# Patient Record
Sex: Female | Born: 1940
Health system: Southern US, Community
[De-identification: ages and names within clinical notes are randomized; demographics above are authoritative.]

## PROBLEM LIST (undated history)

## (undated) DIAGNOSIS — R159 Full incontinence of feces: Secondary | ICD-10-CM

## (undated) DIAGNOSIS — I10 Essential (primary) hypertension: Secondary | ICD-10-CM

## (undated) DIAGNOSIS — K592 Neurogenic bowel, not elsewhere classified: Secondary | ICD-10-CM

## (undated) DIAGNOSIS — Z9181 History of falling: Secondary | ICD-10-CM

## (undated) DIAGNOSIS — G629 Polyneuropathy, unspecified: Secondary | ICD-10-CM

## (undated) DIAGNOSIS — M1990 Unspecified osteoarthritis, unspecified site: Secondary | ICD-10-CM

## (undated) DIAGNOSIS — M4802 Spinal stenosis, cervical region: Secondary | ICD-10-CM

## (undated) DIAGNOSIS — M810 Age-related osteoporosis without current pathological fracture: Secondary | ICD-10-CM

## (undated) DIAGNOSIS — G8220 Paraplegia, unspecified: Secondary | ICD-10-CM

## (undated) DIAGNOSIS — Z87440 Personal history of urinary (tract) infections: Secondary | ICD-10-CM

## (undated) DIAGNOSIS — S72143A Displaced intertrochanteric fracture of unspecified femur, initial encounter for closed fracture: Secondary | ICD-10-CM

## (undated) DIAGNOSIS — J449 Chronic obstructive pulmonary disease, unspecified: Secondary | ICD-10-CM

## (undated) DIAGNOSIS — G35 Multiple sclerosis: Principal | ICD-10-CM

## (undated) DIAGNOSIS — Z9350 Unspecified cystostomy status: Secondary | ICD-10-CM

## (undated) DIAGNOSIS — L89309 Pressure ulcer of unspecified buttock, unspecified stage: Secondary | ICD-10-CM

## (undated) DIAGNOSIS — E785 Hyperlipidemia, unspecified: Secondary | ICD-10-CM

## (undated) DIAGNOSIS — B351 Tinea unguium: Secondary | ICD-10-CM

## (undated) DIAGNOSIS — E119 Type 2 diabetes mellitus without complications: Secondary | ICD-10-CM

## (undated) DIAGNOSIS — L8990 Pressure ulcer of unspecified site, unspecified stage: Secondary | ICD-10-CM

## (undated) DIAGNOSIS — L89314 Pressure ulcer of right buttock, stage 4: Secondary | ICD-10-CM

## (undated) DIAGNOSIS — M109 Gout, unspecified: Secondary | ICD-10-CM

## (undated) DIAGNOSIS — E669 Obesity, unspecified: Secondary | ICD-10-CM

## (undated) DIAGNOSIS — K5900 Constipation, unspecified: Secondary | ICD-10-CM

## (undated) DIAGNOSIS — E2749 Other adrenocortical insufficiency: Secondary | ICD-10-CM

## (undated) DIAGNOSIS — L89159 Pressure ulcer of sacral region, unspecified stage: Secondary | ICD-10-CM

## (undated) DIAGNOSIS — J40 Bronchitis, not specified as acute or chronic: Secondary | ICD-10-CM

## (undated) DIAGNOSIS — F32A Depression, unspecified: Secondary | ICD-10-CM

## (undated) DIAGNOSIS — E0590 Thyrotoxicosis, unspecified without thyrotoxic crisis or storm: Secondary | ICD-10-CM

## (undated) DIAGNOSIS — K219 Gastro-esophageal reflux disease without esophagitis: Secondary | ICD-10-CM

## (undated) DIAGNOSIS — M4807 Spinal stenosis, lumbosacral region: Secondary | ICD-10-CM

## (undated) DIAGNOSIS — R609 Edema, unspecified: Secondary | ICD-10-CM

## (undated) DIAGNOSIS — E559 Vitamin D deficiency, unspecified: Secondary | ICD-10-CM

## (undated) DIAGNOSIS — L03039 Cellulitis of unspecified toe: Secondary | ICD-10-CM

## (undated) DIAGNOSIS — R269 Unspecified abnormalities of gait and mobility: Secondary | ICD-10-CM

## (undated) DIAGNOSIS — L02619 Cutaneous abscess of unspecified foot: Secondary | ICD-10-CM

## (undated) DIAGNOSIS — G709 Myoneural disorder, unspecified: Secondary | ICD-10-CM

## (undated) DIAGNOSIS — E042 Nontoxic multinodular goiter: Secondary | ICD-10-CM

## (undated) DIAGNOSIS — M4804 Spinal stenosis, thoracic region: Secondary | ICD-10-CM

## (undated) DIAGNOSIS — F329 Major depressive disorder, single episode, unspecified: Secondary | ICD-10-CM

## (undated) DIAGNOSIS — D649 Anemia, unspecified: Secondary | ICD-10-CM

## (undated) DIAGNOSIS — R4701 Aphasia: Secondary | ICD-10-CM

## (undated) DIAGNOSIS — N319 Neuromuscular dysfunction of bladder, unspecified: Secondary | ICD-10-CM

## (undated) HISTORY — DX: Unspecified abnormalities of gait and mobility: R26.9

## (undated) HISTORY — DX: Unspecified osteoarthritis, unspecified site: M19.90

## (undated) HISTORY — DX: Spinal stenosis, lumbosacral region: M48.07

## (undated) HISTORY — PX: CATARACT EXTRACTION: SUR2

## (undated) HISTORY — PX: OTHER SURGICAL HISTORY: SHX169

## (undated) HISTORY — PX: CHOLECYSTECTOMY: SHX55

## (undated) HISTORY — DX: Spinal stenosis, thoracic region: M48.04

## (undated) HISTORY — PX: BACK SURGERY: SHX140

## (undated) HISTORY — PX: ABDOMINAL HYSTERECTOMY: SHX81

## (undated) HISTORY — DX: Spinal stenosis, cervical region: M48.02

## (undated) HISTORY — DX: Obesity, unspecified: E66.9

---

## 1997-12-27 ENCOUNTER — Encounter: Admission: RE | Admit: 1997-12-27 | Discharge: 1998-03-27 | Payer: Self-pay | Admitting: *Deleted

## 1999-06-13 ENCOUNTER — Emergency Department (HOSPITAL_COMMUNITY): Admission: EM | Admit: 1999-06-13 | Discharge: 1999-06-13 | Payer: Self-pay | Admitting: *Deleted

## 1999-07-19 ENCOUNTER — Ambulatory Visit (HOSPITAL_COMMUNITY): Admission: RE | Admit: 1999-07-19 | Discharge: 1999-07-19 | Payer: Self-pay | Admitting: *Deleted

## 1999-07-24 ENCOUNTER — Encounter: Payer: Self-pay | Admitting: General Surgery

## 1999-07-25 ENCOUNTER — Observation Stay (HOSPITAL_COMMUNITY): Admission: RE | Admit: 1999-07-25 | Discharge: 1999-07-26 | Payer: Self-pay | Admitting: General Surgery

## 1999-07-25 ENCOUNTER — Encounter (INDEPENDENT_AMBULATORY_CARE_PROVIDER_SITE_OTHER): Payer: Self-pay | Admitting: Specialist

## 1999-11-28 ENCOUNTER — Emergency Department (HOSPITAL_COMMUNITY): Admission: EM | Admit: 1999-11-28 | Discharge: 1999-11-28 | Payer: Self-pay | Admitting: Emergency Medicine

## 2002-07-01 HISTORY — PX: REPLACEMENT TOTAL KNEE BILATERAL: SUR1225

## 2003-03-21 ENCOUNTER — Encounter: Payer: Self-pay | Admitting: Orthopedic Surgery

## 2003-03-23 ENCOUNTER — Inpatient Hospital Stay (HOSPITAL_COMMUNITY): Admission: RE | Admit: 2003-03-23 | Discharge: 2003-03-29 | Payer: Self-pay | Admitting: Orthopedic Surgery

## 2003-11-24 ENCOUNTER — Emergency Department (HOSPITAL_COMMUNITY): Admission: EM | Admit: 2003-11-24 | Discharge: 2003-11-25 | Payer: Self-pay | Admitting: Emergency Medicine

## 2004-10-24 ENCOUNTER — Inpatient Hospital Stay (HOSPITAL_COMMUNITY): Admission: RE | Admit: 2004-10-24 | Discharge: 2004-10-27 | Payer: Self-pay | Admitting: Orthopedic Surgery

## 2005-10-14 ENCOUNTER — Encounter: Admission: RE | Admit: 2005-10-14 | Discharge: 2005-10-14 | Payer: Self-pay | Admitting: Orthopedic Surgery

## 2005-10-19 ENCOUNTER — Encounter: Admission: RE | Admit: 2005-10-19 | Discharge: 2005-10-19 | Payer: Self-pay | Admitting: Orthopedic Surgery

## 2005-11-28 ENCOUNTER — Inpatient Hospital Stay (HOSPITAL_COMMUNITY): Admission: RE | Admit: 2005-11-28 | Discharge: 2005-12-05 | Payer: Self-pay | Admitting: Neurosurgery

## 2005-11-28 ENCOUNTER — Ambulatory Visit: Payer: Self-pay | Admitting: *Deleted

## 2005-12-04 ENCOUNTER — Ambulatory Visit: Payer: Self-pay | Admitting: Physical Medicine & Rehabilitation

## 2005-12-05 ENCOUNTER — Inpatient Hospital Stay (HOSPITAL_COMMUNITY)
Admission: RE | Admit: 2005-12-05 | Discharge: 2005-12-18 | Payer: Self-pay | Admitting: Physical Medicine & Rehabilitation

## 2005-12-30 ENCOUNTER — Ambulatory Visit (HOSPITAL_COMMUNITY): Admission: RE | Admit: 2005-12-30 | Discharge: 2005-12-30 | Payer: Self-pay | Admitting: Neurology

## 2006-01-08 ENCOUNTER — Encounter
Admission: RE | Admit: 2006-01-08 | Discharge: 2006-04-08 | Payer: Self-pay | Admitting: Physical Medicine & Rehabilitation

## 2006-01-08 ENCOUNTER — Ambulatory Visit: Payer: Self-pay | Admitting: Physical Medicine & Rehabilitation

## 2006-03-06 ENCOUNTER — Ambulatory Visit: Payer: Self-pay | Admitting: Physical Medicine & Rehabilitation

## 2006-03-17 ENCOUNTER — Encounter
Admission: RE | Admit: 2006-03-17 | Discharge: 2006-05-26 | Payer: Self-pay | Admitting: Physical Medicine & Rehabilitation

## 2008-09-14 ENCOUNTER — Encounter: Admission: RE | Admit: 2008-09-14 | Discharge: 2008-09-14 | Payer: Self-pay | Admitting: Internal Medicine

## 2010-04-12 ENCOUNTER — Encounter
Admission: RE | Admit: 2010-04-12 | Discharge: 2010-06-20 | Payer: Self-pay | Source: Home / Self Care | Attending: Neurology | Admitting: Neurology

## 2010-07-22 ENCOUNTER — Encounter: Payer: Self-pay | Admitting: Neurology

## 2010-11-16 NOTE — Discharge Summary (Signed)
NAME:  Laura Roberts, Laura Roberts                        ACCOUNT NO.:  [id number]   MEDICAL RECORD NO.:  [id number]                   PATIENT TYPE:  INP   LOCATION:  5005                                 FACILITY:  [facility]   PHYSICIAN:  Elana Alm. Thurston Hole, M.D.              DATE OF BIRTH:  [birth year]   DATE OF ADMISSION:  03/23/2003  DATE OF DISCHARGE:  03/29/2003                                 DISCHARGE SUMMARY   ADMISSION DIAGNOSES:  1. End-stage degenerative joint disease, right knee.  2. Non-insulin-dependent diabetes.  3. Coronary artery disease.  4. Hypertension.   DISCHARGE DIAGNOSES:  1. End-stage degenerative joint disease, right knee.  2. Non-insulin-dependent diabetes.  3. Coronary artery disease.  4. Hypertension.   HISTORY OF PRESENT ILLNESS:  The patient is a 69 year old black female with  a long-standing history of bilateral knee DJD, right more painful than left.  She had tried anti-inflammatories, cortisone and Supartz, all without  success.  She understands the risks, benefits and possible complications of  a right total knee replacement for bone-on-bone osteoarthritis and is  without question.   PROCEDURES IN HOUSE:  On March 23, 2003, the patient underwent a right  total knee replacement by Dr. Elana Alm. Wainer.  She tolerated the procedure  well.   HOSPITAL COURSE:  Postop day 1, she was doing well.  Her pain was under  control with a PCA.  She was in her CPM, T-max of 102.4, T now was 99.9.  Surgical wound was well-approximated.  She was placed on Dilaudid for pain,  as she had a codeine allergy.  Blood cultures x2 were ordered for a  temperature of 101 or greater.  Her PCA was discontinued and her drain was  discontinued.  Postop day 2, her hemoglobin was 6.4; she was transfused two  units of packed red blood cells; T-max of 102.5; her blood sugar was 164.  Postop day 3, T-max of 101.6, no shortness of breath or chest pain.  She is  on PCA morphine, as the  Dilaudid was not controlling her pain.  Her  hemoglobin was 10.2, status post packed red blood cells yesterday; it was  8.6 this morning; two more units of packed red blood cells were transfused.  Internal Medicine hospitalist was consulted due to persistent fever on  postop day 3.  She is on Cipro for a UA, but still continues to be febrile;  Cipro was discontinue and she was placed on Zosyn.  Continue with physical  therapy.  She tolerated the blood transfusion well.  Postop day 5, T-max of  100.6, pain was under control with oral (p.o.) morphine, fever was improving  on Zosyn, hemoglobin was 9.3, potassium was 3.1, her white blood cell count  was resolving; she is on day 3 of Zosyn.  Blood cultures and urine cultures  were negative.  Postop day 6, patient is significantly improved.  Temperature is 99.5.  Knee is swollen but no drainage.  She was discharged  to home in stable condition, weightbearing as tolerated with her knee  immobilizer.   CURRENT MEDICATIONS:  1. Coumadin 5 mg one p.o. daily.  2. MS Contin 15 mg three tablets twice a day.  3. MSIR 15 mg one tablet q.6 h. p.r.n. pain.  4. Clindamycin 300 mg one tablet four times a day.  5. Diovan 160 mg one p.o. daily.  6. Metformin 500 mg twice a day, one in the morning and one in the evening.  7. Nu-Iron 150 mg twice a day.  8. Senior multivitamin one tablet once a day.   ACTIVITY:  She is weightbearing as tolerated.   DIET:  On a regular diabetic diet.   DISPOSITION:  Discharged to home in stable condition.      Kirstin Shepperson, P.A.                  Robert A. Thurston Hole, M.D.    KS/MEDQ  D:  04/13/2003  T:  04/13/2003  Job:  [id number]

## 2010-11-16 NOTE — Discharge Summary (Signed)
NAME:  Laura Roberts, Laura Roberts NO.:  [id number]   MEDICAL RECORD NO.:  [id number]          PATIENT TYPE:  INP   LOCATION:  5014                         FACILITY:  [facility]   PHYSICIAN:  Elana Alm. Thurston Hole, M.D. DATE OF BIRTH:  Jul 14, 1940   DATE OF ADMISSION:  10/24/2004  DATE OF DISCHARGE:  10/27/2004                                 DISCHARGE SUMMARY   ADMITTING DIAGNOSES:  1.  End-stage degenerative joint disease left knee.  2.  Hypertension.  3.  Diabetes.   DISCHARGE DIAGNOSES:  1.  End-stage degenerative joint disease left knee status post total knee.  2.  Hypertension.  3.  Diabetes.   HISTORY OF PRESENT ILLNESS:  Patient is a 70 year old black female with a  history of bilateral end-stage DJD.  She had a right total knee replacement  in September of 2004 and has done very well with this.  Now she is ready to  have a left total knee replacement as she has failed conservative care  including anti-inflammatories and physical therapy.  She understands risks,  benefits, and possible complications of a left total knee replacement and is  without question.   PROCEDURES:  On October 24, 2004 patient underwent a left total knee  replacement by Dr. Thurston Hole as well as a left knee femoral nerve block by  anesthesia.  She tolerated both procedures well.  She was admitted  postoperatively for DVT prophylaxis, pain control, and physical therapy.  Postoperative day one she had difficulty with pain control, Tmax of 101.1.  Temperature on examination was 100.7.  Hemoglobin was 9.4.  Her UA was  negative.  Her surgical wound was well approximated.  Patient has multiple  history of fever of unknown origin and increased blood sugar.  She had it  completely worked up when her left knee was done and no reason was found for  these fevers.  Dressing change was ordered.  Her PCA was discontinued and  she was started on Dilaudid for pain.  Postoperative day two patient was  doing well except  for pain control.  Her hemoglobin was 8.1.  INR was 1.5.  She was metabolically stable.  She received 2 units of packed red blood  cells on postoperative day two.  Postoperative day three patient was doing  better on p.o. Dilaudid.  Hemoglobin was 9.4 post transfusion.  Tmax was  100.1.  She was discharged to home in stable condition.  Weightbearing as  tolerated on the regular diabetic diet.   DISCHARGE MEDICATIONS:  1.  Dilaudid 2 mg one to three tablets every four hours as needed for pain.  2.  Robaxin 500 mg one to two q.4-6h. p.r.n. muscle spasm.  3.  Coumadin 5 mg one tablet daily.  4.  Diovan 160 mg one tablet twice a day.  5.  Atenolol 50 mg one tablet a day.  6.  Metformin 500 mg two tablets in the morning, one tablet in the evening.  7.  Calcium plus D 500 mg one tablet a day.   She has been instructed to use the CPM eight hours a day  0-90 degrees and  elevate her left heel on a folded pillow every morning for 30 minutes.  She  is to call with a temperature greater than 101, increased pain, increased  redness, increased swelling, or increased drainage.  She will follow up with  Dr. Thurston Hole on May 9.       KS/MEDQ  D:  01/21/2005  T:  01/21/2005  Job:  [id number]

## 2010-11-16 NOTE — Discharge Summary (Signed)
NAME:  NIANG, MITCHELTREE NO.:  [id number]   MEDICAL RECORD NO.:  [id number]          PATIENT TYPE:  INP   LOCATION:  3105                         FACILITY:  [facility]   PHYSICIAN:  Hewitt Shorts, M.D.DATE OF BIRTH:  09-10-1940   DATE OF ADMISSION:  11/28/2005  DATE OF DISCHARGE:  12/05/2005                                 DISCHARGE SUMMARY   ADMISSION HISTORY AND PHYSICAL EXAMINATION:  The patient is a 70 year old  woman who presented with thoracic stenosis at T11-12 level.  MRI revealed  alteration of signal from within the spinal cord.  She had undergone a right  total knee replacement in September of 2005, a left total knee replacement  in April of 2006.  She had chronic aching and weakness in her thighs and  legs since those surgeries.  She underwent physical therapy and her  orthopedist obtained an MRI scan and found the thoracic stenosis.  Initially, examination showed mild weakness of the iliopsoas bilaterally.  Reexamination showed progression of her weakness with increased weakness in  the lower extremities and therefore the patient was admitted for a  decompressive thoracic laminectomy.  Her lumbar studies did show lumbar  stenosis and she may well require lumbar laminectomy for decompression.  It  was felt best to proceed slowly with thoracic decompression.  Further  details of her history are included in admission note.  General examination  was unremarkable.  Neurologic examination showed paraparesis of the  iliopsoas 4/5 with quadriceps 5/5 bilaterally.  Dorsiflexion was 4+ to 5  bilaterally and the extensor hallicis longus was 4 to 4+ bilaterally.  Plantar flexion was 5 bilaterally.  Sensation was intact to pinprick.  Reflexes were intact.   HOSPITAL COURSE:  The patient was admitted, underwent a thoracic laminectomy  for decompression.  She did well through the surgery.  However, on the first  postoperative evening, she developed dull anterior chest  discomfort when she  went up to the bathroom the third time.  Vital signs showed a pulse of 80,  blood pressure 149/88 and O2 saturation of 95.  The patient was given  nitroglycerin.  She denied shortness of breath but had mild dyspnea and also  hiccups.  On exam she had no diaphoresis, no increased work of breathing, no  acute distress.  Her wound and dressing appeared clean and dry.  The  official interpretation of her EKG was that it was normal.  The patient  reported 75% improvement of the chest discomfort with nitroglycerin and it  was repeated.  She was also given antacid and we consulted [facility] who saw the patient.  CT scan of the chest was obtained  and was negative for pulmonary embolism.  The patient was changed over to  telemetry.  This patient was able to make stable progress, increasing  ambulation and activity.  The patient then 2 days after surgery developed  weakness in the lower extremities.  She was assessed by Dr. Channing Mutters who obtained  an MRI scan and found no evidence of hematoma or acute compression.  The  patient was started on  steroids and subsequent evaluation showed improvement  with Decadron.  The patient was seen in neurology consultation by Dr. Sandria Manly.  MRI was reviewed and question of whether the patient in fact had underlying  multiple sclerosis and may have an acute exacerbation.  My review of the MRI  scan of the thoracic spine done postoperatively showed good decompression of  the thoracic stenosis with typical post surgical changes. We began to taper  the patient's Decadron.  Physical therapy and occupational therapy were  consulted and physical medicine Rehabilitation were consulted regarding  comprehensive inpatient rehabilitation.  Dr. Sandria Manly ordered a number of tests  including visual responses, auditory responses, serum ANA and plan to  proceed with lumbar puncture when she was stable.  Cardiology felt that the  patient did not suffer any  acute cardiac event and felt that she had  recovered well from a cardiac perspective without any significant cardiac  problems.  The patient was accepted to rehabilitation and was transferred to  rehabilitation by Dr. Gwenlyn Saran.  The patient is planned to be continued to  be followed by Dr. Sandria Manly who planned a lumbar puncture the following week and  her neurology workup is going to continue, rehabilitation patient as well as  an outpatient with Dr. Sandria Manly.   DISCHARGE DIAGNOSES:  1.  Thoracic stenosis with spinal cord compression status post thoracic      laminectomy during this hospitalization.  2.  Chest pain postoperatively, rule out for myocardial infarction, rule out      for pulmonary embolism, etiology      uncertain.  3.  Question of multiple sclerosis undergoing evaluation by Dr. Avie Echevaria.  4.  Paraparesis related to her thoracic stenosis and possibly related to      multiple sclerosis if present.      Hewitt Shorts, M.D.  Electronically Signed     RWN/MEDQ  D:  01/08/2006  T:  01/08/2006  Job:  [id number]

## 2010-11-16 NOTE — Consult Note (Signed)
NAME:  Laura Roberts, Laura Roberts NO.:  [id number]   MEDICAL RECORD NO.:  [id number]          PATIENT TYPE:  INP   LOCATION:  3106                         FACILITY:  [facility]   PHYSICIAN:  Vida Roller, M.D.   DATE OF BIRTH:  09/19/1940   DATE OF CONSULTATION:  11/28/2005  DATE OF DISCHARGE:                                   CONSULTATION   PRIMARY CARE PHYSICIAN:  Dr. Nicholos Johns.   SUMMARY OF HISTORY:  Laura Roberts is a 70 year old African-American female who  was admitted postoperatively after her thoracic laminectomy.  We were asked  to consult secondary to postoperative chest discomfort.   Her surgery today was unremarkable.  Patient states that she underwent the  surgery because after both of her knees were repaired, as she started  physical rehab, she began having back discomfort associated with bilateral  leg weakness and numbness, which led to her evaluation of her back, and  today surgery.  Postoperatively, she states that she has felt well, except  for some back soreness, and she developed hiccups immediately after surgery;  this had been intermittent throughout the day.  On her third trip to the  bathroom, she also developed some anterior chest indigestion type feelings.  She felt that this became worse when she tried to burp.  She denied any  radiation of the discomfort, nausea, vomiting, shortness of breath or  diaphoresis.  It was not particularly worse with her hiccups nor was it  pleuritic.  She denies prior occurrences.  She thinks that the discomfort  actually started around 1700 hours.  She initially gave it a 9 on the scale  of 0-10.  Dr. Newell Coral had given her 2 sublingual nitroglycerin, which she  states reduced the discomfort to a 2 on a scale of 0-10.  She is also  complaining of side pains of her abdomen.   PAST MEDICAL HISTORY:   ALLERGIES:  CODEINE.   MEDICATIONS PRIOR TO ADMISSION:  Include:  1.  Diovan 160 b.i.d.  2.  Metformin 1000 mg q.a.m.  500 mg q.p.m.  3.  Atenolol 50 mg q.d.  4.  Aspirin 81 mg q.d.  5.  Goody powder p.r.n.  6.  Lidocaine patch, unknown dosage, 12 hours on, 12 hours off.   PAST MEDICAL HISTORY:  Notable for:  1.  Type 2 diabetes.  2.  Obesity.  3.  Hypertension.  4.  DJD with right and left knee replacements in October 2004 and April      2004, respectively.  According to the patient, prior to her knee surgery      in 2004, she did have an adenosine Myoview at [facility] and      [facility], and she states that this was okay.  5.  Hysterectomy in the 1970s.  6.  Cholecystectomy in 2006.  7.  Left carpal tunnel repair, unknown date.   SOCIAL HISTORY:  She resides in [place] alone.  She has 2 sons, 2  grandchildren, no great-grandchildren.  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  She  denies any  alcohol, tobacco, drugs, herbal medications.  She does try to maintain low-  salt ADA diet.  She does not exercise, secondary to her arthritis.  She has  been undergoing rehab given her recent knee surgeries.   FAMILY HISTORY:  Mother is deceased in her 45s with some type of cancer,  history of diabetes.  Her father died at an unknown age of cirrhosis.  She  has 2 sisters alive and well.  One brother with history of ETOH.   REVIEW OF SYSTEMS:  In addition to above, notable for reading glasses,  partial dentures, postmenopausal, some arthralgias in her back, legs and  knees, and a known cyst in her chest wall.   CURRENT MEDICATIONS:  Included:  1.  Atenolol 50 mg q.d.  2.  Toradol p.r.n.  3.  Metformin 1000 mg q.a.m., 500 mg q.p.m.  4.  Diovan 150 q.d.   PHYSICAL EXAMINATION:  GENERAL: Well-nourished, well-developed pleasant  obese African-American female in no apparent distress.  Temperature 97.6.  Blood pressure 149/88.  Pulse 80.  Respirations 20.  Admission weight was  183.7.  Sat 95% on room air.  NECK: Supple without  thyromegaly, adenopathy, JVD or carotid bruits.  CHEST: Symmetrical excursion.  Lungs were clear to auscultation without  rales, rhonchi, or wheezing.  HEART: PMI is not displaced.  Regular rate and rhythm.  Do not appreciate  any murmurs, rubs, clicks or gallops.  EXTREMITIES: Negative cyanosis, clubbing or edema.  She does have lower  extremity pneumatic compression devices on.  Distal pulses were symmetrical  and intact.  SKIN: Appeared to be intact.  ABDOMEN: Obese.  Bowel sounds present, but were slightly diminished without  organomegaly, masses or tenderness.  MUSCULOSKELETAL: Essentially unremarkable.  NEURO: Grossly unremarkable.   Chest x-ray preoperatively showed no active disease.  Chest x-ray performed  today showed mild congestion and left lower lobe atelectasis.  Preoperative  EKG shows normal sinus rhythm, left axis deviation, normal interval, delayed  R and T wave inversions in leads III and aVF.  No old EKGs are available for  comparison.  Subsequent EKG today during chest discomfort did not show any  acute changes.   H&H is 12.9 and 39.2.  Normal indices, platelets 388,000, WBC is 10.9.  Sodium 139, potassium 4.6, BUN 12, creatinine 1.1, glucose 103.   IMPRESSION:  1.  Status post inferior T10-11-12 laminectomy on 11/28/2005.  2.  Postoperative hiccups.  3.  Postoperative atypical prolonged chest discomfort of uncertain etiology.      However, we need to rule out pulmonary embolism, an acute coronary      syndrome.  4.  Hypertension.  5.  History as noted for past medical history.   DISPOSITION:  Dr. Dorethea Clan reviewed patient's history, spoke and examined the  patient and agrees with the above.  We will cycle enzymes to rule out  ACS/myocardial infarction.  Given her high risk for pulmonary embolism, we  are also ordering a spiral CT and lower extremity Dopplers.  We will continue to treat her discomfort with pain medications since anticoagulation  with heparin,  Integrilin or aspirin is contraindicated given her surgery  today.  If she rules out for myocardial infarction, we will consider an  echocardiogram and an adenosine Myoview, which could be done as an  outpatient.  If her CT scan is positive, she will need an inferior vena cava  filter, as we are not able to anticoagulate her.      Joellyn Rued,  P.A. [facility]      Vida Roller, M.D.  Electronically Signed    EW/MEDQ  D:  11/28/2005  T:  11/29/2005  Job:  [id number]   cc:   Hewitt Shorts, M.D.  Fax: [phone]   Georgianne Fick, M.D.  Fax: [phone]

## 2010-11-16 NOTE — Op Note (Signed)
NAME:  Laura Roberts, Laura Roberts NO.:  [id number]   MEDICAL RECORD NO.:  [id number]          PATIENT TYPE:  INP   LOCATION:  2899                         FACILITY:  [facility]   PHYSICIAN:  Hewitt Shorts, M.D.DATE OF BIRTH:  1940-09-06   DATE OF PROCEDURE:  11/28/2005  DATE OF DISCHARGE:                                 OPERATIVE REPORT   PREOPERATIVE DIAGNOSIS:  Thoracic stenosis with myelopathy.  Thoracic  spondylosis with myelopathy.  Thoracic degenerative disk disease with  myelopathy and paraparesis.   POSTOPERATIVE DIAGNOSIS:  Thoracic stenosis with myelopathy.  Thoracic  spondylosis with myelopathy.  Thoracic degenerative disk disease with  myelopathy and paraparesis.   OPERATION PERFORMED:  Inferior T10, T11 and T12 laminectomy with  decompression to T10-11 level, T11-12 level and T12-L1 level with  microdissection.   SURGEON:  Hewitt Shorts, M.D.   ASSISTANT:  Hilda Lias, M.D. and Nelia Shi. Webb Silversmith, RN   ANESTHESIA:  General endotracheal.   INDICATIONS FOR PROCEDURE:  The patient is a 70 year old woman who presented  with paraparesis and was found to have thoracic stenosis secondary to  spondylosis and degenerative disk disease at the T11-12 level due to disk  bulging and facet and ligament hypertrophy.  The cords showed evidence of  increased signal with enhancement and decision was made to proceed with  decompression.   DESCRIPTION OF PROCEDURE:  The patient was brought to the operating room and  placed under general endotracheal anesthesia.  The patient was turned to a  prone position and the thoracic and lumbar regions were prepped with  Betadine soap and solution and draped in sterile fashion.  The midline was  infiltrated with local anesthetic with epinephrine.  An x-ray was taken to  localize the T11-12 level.  Then a midline made and carried down to through  the subcutaneous tissue.  Bipolar cautery and electrocautery were used to  maintain  hemostasis.  Dissection was carried down to the thoracolumbar  fascia which was incised bilaterally and the paraspinal muscles were  dissected from the spinous process and lamina in a subperiosteal fashion.  Bipolar cautery and electrocautery  were used to maintain hemostasis.  Self-  retaining retractors were placed.  Another x-ray was taken and the T10, T11  and T12 spinous processes and lamina were identified.  Laminectomy was  performed using the double action rongeur, X-max drill and Kerrison punches.  The microscope was draped and brought into the field to provide additional  magnification, illumination and visualization and the decompression was  performed using microdissection and microsurgical technique.  Edges of the  bone were waxed as needed with bone wax to maintain hemostasis.  Complete  T12 and T11 laminectomies were performed as well as an inferior T10  laminectomy and we were able to decompress T10-11, T11-12 and T12-L1 levels.  The ligamentum flavum was removed and good decompression of the thecal sac  was achieved from the midportion of T10 to the superior aspect of L1 (which  was undercut as well).  Once the decompression was completed, the bone was  again waxed.  Gelfoam was placed in  the laminectomy defect.  Good hemostasis  was confirmed and then we proceeded with closure.  The paraspinal muscles  were approximated with interrupted undyed 1 Vicryl sutures.  The deep fascia  closed with interrupted, undyed 1 Vicryl sutures, Scarpa's fascia closed  with interrupted undyed 1 Vicryl sutures and the subcutaneous, subcuticular  layer were closed with interrupted inverted 2-0 and 3-0 undyed Vicryl  sutures.  Skin edges approximated with Dermabond.  Wound was dressed with  Adaptic and sterile gauze and Hypafix.  The procedure was tolerated well.  The estimated blood loss was 75 mL.  Sponge, needle and instrument counts  were correct.  Following surgery, the patient was turned  back to supine  position to be reversed from anesthetic, extubated and transferred to  recovery room for further care.      Hewitt Shorts, M.D.  Electronically Signed     RWN/MEDQ  D:  11/28/2005  T:  11/28/2005  Job:  [id number]

## 2010-11-16 NOTE — Assessment & Plan Note (Signed)
REASON FOR VISIT:  Laura Roberts returns to the clinic today for follow up  evaluation.  The patient underwent a T12 laminectomy and decompression along  with microdiskectomy Nov 28, 2005.  She was also found to have a new  diagnosis of multiple sclerosis.   Since the last clinic visit the patient has finished all home health  therapy.  They have instructed her in a home exercise program, which she has  been requested to do four times per day along with walking on a regular  basis using her walker.  There is also a suggestion about outpatient aquatic  therapy.  The patient is not sure about aquatic therapy as she needs to have  a pool if it does not exacerbate her multiple sclerosis.   In terms of her multiple sclerosis she is followed up by Dr. Sandria Manly, her  neurologist.  Dr. Sandria Manly presently has her on Rebif three times per week.  The  patient is due to follow up with Dr. Newell Coral, her neurosurgeon, the end of  the month.  She has already seen her primary care physician, Dr.  Nicholos Johns.   The patient reports that she can walk on her feet with her rolling walker.  She reports that she uses her Lidoderm patches on an as needed basis also  with the Darvocet and Robaxin; and, gets good relief overall.  She reports  that her blood sugars has been in the 80-120 range.   MEDICATIONS:  1. Diovan 160 mg twice a day  2. Glucophage 500 mg in the morning and 1,000 mg in the evening.  3. Lidoderm patch on 12 hours and off 12 hours.  4. Atenolol 100 mg daily.  5. Tylenol p.r.n.  6. Robaxin 500 mg q.6 hours p.r.n. (approximately three per day).  7. Darvocet N 100 q.6 hours (approximately three per day).   REVIEW OF SYSTEMS:  The review of systems is positive for fever and chills,  night sweats, high and low blood sugars, and poor appetite.   PHYSICAL EXAMINATION:  GENERAL APPEARANCE:  This is a well-appearing thin  adult female in mild-to-no acute discomfort.  VITAL SIGNS:  Blood pressure is  152/84 with a pulse of 81, respiratory rate  18 and O2 saturation 98% on room air.  NEUROLOGIC EXAMINATION:  The patient has at least 4+ to 5-/5 strength  throughout the bilateral upper and lower extremities.  Bulk and tone are  normal.  Reflexes are 2+ and symmetrical.  She has slightly decreased  sensation in the left leg compared to the right.   IMPRESSION:  1. Status post thoracic 10, thoracic 12 laminectomy and decompression with      microdiskectomy, Nov 28 2005, for thoracic stenosis with myelopathy.  2. Recent diagnosis of multiple sclerosis.  3. Noninsulin-dependent diabetes mellitus.  4. Hypertension.   In the office today no refills were necessary on her medications.  We have  set her up for outpatient therapy at the [facility] address.  She is  interested in possibly doing aquatic therapy, but needs to check with some  of the local pools and some of the people in the multiple sclerosis support  group to see which pool may be most appropriate for her.  We will not set up  any aquatic therapy at this time.   We will plan to see the patient follow up in approximately three months  time.           ______________________________  Ellwood Dense, M.D.  DC/MedQ  D:  03/07/2006 13:50:33  T:  03/07/2006 22:44:02  Job #:  [id number]

## 2010-11-16 NOTE — Discharge Summary (Signed)
NAME:  Laura Roberts, Laura Roberts NO.:  [id number]   MEDICAL RECORD NO.:  [id number]          PATIENT TYPE:  IPS   LOCATION:  4001                         FACILITY:  [facility]   PHYSICIAN:  Ellwood Dense, M.D.   DATE OF BIRTH:  January 15, 1941   DATE OF ADMISSION:  12/05/2005  DATE OF DISCHARGE:                                 DISCHARGE SUMMARY   DICTATED BY:  Mariam Dollar, P.A.   DISCHARGE DIAGNOSES:  1.  Thoracic myelopathy with stenosis status post inferior thoracic T10,      T11, T12 laminectomy with decompression; thoracic T11 and T11,T12 with      T12-L1 microdissection on Nov 28, 2005.  2.  Pain management.  3.  Workup for multiple sclerosis.  4.  Non-insulin dependent diabetes mellitus.  5.  Hypertension.   HISTORY OF PRESENT ILLNESS:  A 70 year old right handed black female with  history of total knee replacements in 2006, who is admitted on May 31 with  progressive lower extremity tightness and weakness since second knee  replacement of April 2006.  Noted MRI of the brain on April 21 with white  matter lesions consistent with multiple sclerosis.  Thoracic spine films on  May 31 with thoracic stenosis T11-T12 level.  Underwent inferior T10, T11,  and T12 laminectomy decompression, microdissection on May 31 per Dr.  Newell Coral.  Decadron taper initiated.  Postoperative chest discomfort, CT of  the chest negative.  Troponin negative.  Cardiology Dr. Dorethea Clan planned  outpatient stress test.  Followup neurology service, Dr. Sandria Manly with workup  for multiple sclerosis.  Serum ANA and EEG were pending.  Plan lumbar  puncture.  She was admitted for a comprehensive rehab program.   PAST MEDICAL HISTORY:  See discharge diagnoses.  No alcohol or tobacco.   ALLERGIES:  CODEINE.   SOCIAL HISTORY:  She lives alone.  Works with [employer].  She is widowed.  Family Can Assist is needed.  She lives on a 1 level home  with 1 step entry.   MEDICATIONS:  1.  Prior to  admission were Diovan 160 mg daily.  2.  Glucophage 500 mg 1 in the a.m., 2 in the p.m.  3.  Atenolol 50 mg daily.  4.  Aspirin 81 mg daily.  5.  Lidoderm patch.  6.  Goody powders as needed.   REHABILITATION AND HOSPITAL COURSE:  Patient was admitted to inpatient rehab  services with therapies initiated on a b.i.d. basis consisting of physical  therapy, occupational therapy and rehabilitation nursing.  The following  issues were addressed during patient's rehabilitation state.  Pertaining to  Mrs. Laskowski's thoracic myelopathy, multilevel thoracic T10, T11, T12  laminectomy with decompression and microdissection on Nov 28, 2005 per Dr.  Newell Coral surgical site healing nicely.  No signs of infection.  She would  follow up with Dr. Newell Coral.  She completed a Decadron taper.  She continued  on her Lidoderm patch as well as Duragesic for pain control with good  results.  Workup for multiple sclerosis per neurology services, Dr. Sandria Manly,  plan lumbar puncture prior to discharge to  establish ongoing suspect  multiple sclerosis identified per MRI of the brain on October 19, 2005 that  showed white matter lesions consistent with multiple sclerosis.  She  remained on subcutaneous Lovenox for deep vein thrombosis prophylaxis  throughout her rehab course.  Blood sugars is 92, 135 and 133.  She  continued on her Glucophage as well as diabetic diet.  Blood pressures  controlled with Diovan as well as Tenormin with diastolic pressure 63-70.  Overall, for her function status, she was minimal assist for function  mobility, supervision to minimal assist for activities of daily living.  She  was discharged to home with ongoing therapies as recommended per rehab  services.  Her strength and endurance had greatly improved.   Latest labs shows sodium 137, potassium 4.0, BUN 16, creatinine 0.8,  hemoglobin 12.2, hematocrit 36.5, platelet 467,00.   DISCHARGE MEDICATIONS:  1.  Diovan 160 mg twice daily.  2.   Glucophage 500 mg in the a.m., 1000 mg in the p.m.  3.  Duragesic patch 25 mcg, change every 72 hours.  4.  Lidoderm patch remove every 12 hours.  5.  Atenolol 100 mg daily.  6.  Tylenol as needed.  7.  Robaxin 500 mg every 6 hours as needed, spasms.  8.  Darvocet-N 100 1 tab every 6 hours as needed.   DIET:  Diabetic diet.   ACTIVITY:  As tolerated.   SPECIAL INSTRUCTIONS:  1.  Follow up with Dr. Sandria Manly, neurology services, [phone], concerning workup      for multiple sclerosis and results of most recent lumbar puncture.  2.  Follow up with Dr. Newell Coral, neurosurgery, [phone].  3.  Call for appointment, Dr. Ellwood Dense, rehab services as advised.  4.  Talk to Dr. Nicholos Johns, medical management.           ______________________________  Ellwood Dense, M.D.     DC/MEDQ  D:  12/17/2005  T:  12/17/2005  Job:  [id number]   cc:   Genene Churn. Love, M.D.  Fax: [phone]   Hewitt Shorts, M.D.  Fax: [phone]   Georgianne Fick, M.D.  Fax: [phone]   Vida Roller, M.D.  Fax: [phone]

## 2010-11-16 NOTE — H&P (Signed)
NAME:  Laura Roberts, Laura Roberts NO.:  [id number]   MEDICAL RECORD NO.:  [id number]          PATIENT TYPE:  IPS   LOCATION:  4001                         FACILITY:  [facility]   PHYSICIAN:  Ellwood Dense, M.D.   DATE OF BIRTH:  October 09, 1940   DATE OF ADMISSION:  12/05/2005  DATE OF DISCHARGE:                                HISTORY & PHYSICAL   NEUROLOGIST:  Dr. Sandria Manly   NEUROSURGEON:  Dr. Newell Coral   PRIMARY CARE PHYSICIAN:  Dr. Nicholos Johns   CARDIOLOGIST:  Dr. Dorethea Clan   HISTORY OF PRESENT ILLNESS:  Laura Roberts is a 70 year old right-handed African-  American female with a history of bilateral total knee replacements in 2006.   The patient was admitted Nov 28, 2005, with progressive low extremity  tightness and weakness since her second total knee replacement in April  2006.  She was noted to have an MRI scan of her brain October 19, 2005, which  showed white matter lesions consistent with multiple sclerosis.  Her  remaining past medical history is significant for diabetes mellitus x10  years along with hypertension.   A thoracic spine film Nov 28, 2005, had shown thoracic stenosis at T11-12  level.  The patient was evaluated by Dr. Newell Coral and underwent an inferior  T10-T12 laminectomy and decompression with mass dissection T11-12, T12-L1 on  Nov 28, 2005, by Dr. Newell Coral.  Her Decadron taper was advised down to 4 mg  q.12h. as of December 06, 2005, and then decreased to 2 mg q.12h. as of December 07, 2005 with questionable discontinuation.  She was noted to have chest  discomfort and a chest CT was negative for pulmonary embolism.  Troponin  levels were negative.  Dr. Dorethea Clan from cardiology planned an outpatient  stress test.  She has had followup with Dr. Sandria Manly from neurology and workup  for MS showed a serum ANA and EEG pending.  Brain stem evoked potentials  were normal.  There is a plan for a lumbar puncture next week.  Pain control  was initially with PCA pump and that has been  discontinued and she is  presently on a Lidoderm patch with p.r.n. Darvocet.  She reports that she is  not getting adequate relief but also reports that she cannot take any  codeine products.   The patient was evaluated by the rehabilitation physicians and felt to be an  appropriate candidate for inpatient rehabilitation.   REVIEW OF SYSTEMS:  Positive for reflux, lumbago, weakness, and numbness.   PAST MEDICAL HISTORY:  1.  Hypertension.  2.  Non-insulin-dependent diabetes mellitus.  3.  Prior cholecystectomy.  4.  Left carpal tunnel release.  5.  Bilateral total knee replacements in 2006.   FAMILY HISTORY:  Positive for coronary artery disease.   SOCIAL HISTORY:  The patient is widowed and lives alone.  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  Most of the children are in protective environment  until their other family members are cleared from drugs.  The family of Laura Roberts  can assist as needed when she leaves.  She lives in a one-level home  with one step to enter.  She does not use alcohol or tobacco.   FUNCTIONAL HISTORY PRIOR TO ADMISSION:  Independent and working.   ALLERGIES:  CODEINE intolerance.   MEDICATIONS PRIOR TO ADMISSION:  1.  Diovan 160 mg daily.  2.  Glucophage 500 mg one tablet q.a.m. and two tablets q.p.m.  3.  Atenolol 50 mg daily.  4.  Aspirin 81 mg daily.  5.  Goody Powder p.r.n.  6.  Lidocaine patch daily p.r.n.   Recent labs showed hemoglobin 12.9; hematocrit of 39.2; platelet count of  380,000; and white count of 10.9.  Recent sodium was 139, potassium 4.6,  chloride 103, CO2 27, BUN 12, and creatinine 1.1.   PHYSICAL EXAMINATION:  VITAL SIGNS:  Blood pressure 135/83 with a pulse 75,  respiratory rate 18, and temperature 98.5.  HEENT:  Normocephalic, nontraumatic.  CARDIOVASCULAR:  Regular rate and rhythm, S1, S2, without murmurs.  ABDOMEN:  Soft, nontender, with positive  bowel sounds.  BACK:  Shows well-healing wound with dry dressing.  NEUROLOGIC:  Alert and oriented x3.  Cranial nerves II-XII are intact.  Bilateral upper extremity exam showed 5-/5 strength throughout.  Bulk and  tone were normal and reflexes were 2+ and symmetrical.  Sensation was intact  to light touch throughout.  Lower extremity exam showed 4/5 to 4+/5 strength  throughout with some complaints of pain with any range of motion or motion.  Sensation was slightly decreased in the bilateral lower extremities and  reflexes were 1+ at the bilateral knees and ankles.   IMPRESSION:  1.  Status post inferior T10-T12 laminectomy and decompression for thoracic      stenosis with myelopathy performed Nov 28, 2005, by Dr. Newell Coral.  2.  Lower extremity pain related to #1.  3.  New diagnosis of multiple sclerosis per brain MRI scan October 19, 2005,      with followup by Dr. Sandria Manly.  4.  Planned outpatient cardiac stress test for recent chest discomfort.   Presently the patient has deficits in ADLs, transfers, and ambulations  secondary to her lower thoracic laminectomy and decompression for stenosis  with myelopathy.   PLAN:  1.  Admit to the rehabilitation unit for daily OT and PT therapies to      advance ADLs, transfers, and ambulation.  2.  Check admission labs including CBC and CMET Friday, December 06, 2005.  3.  Twenty-four-hour nursing for medication administration and monitoring of      vital signs along with wound dressing changes as needed.  4.  Add Duragesic patch 25 mcg per hour and change q.72h. in addition to      p.r.n. Robaxin and p.r.n. Darvocet.  5.  Bilateral PRAFO in bed for foot drop and to prevent heel ulcers.  6.  Discontinue Foley in the a.m. and begin bladder training.  7.  Continue Decadron 4 mg q.12h. through December 06, 2005, then decrease to 2      mg q.12h. with further course to be determined. 8.  Monitor CBGs a.c. and h.s. on Glucophage 500 mg q.a.m. and 1000 mg       q.h.s.  9.  Monitor hypertension on Diovan 160 mg b.i.d. and Tenormin 50 mg daily.  10. Continue Lidoderm patch on 12 hours and off 12 hours daily.  11. Add subcu Lovenox 40 mg daily for DVT prophylaxis.   GOALS:  Modified independent ADLs, transfers,  and ambulation.   PROGNOSIS:  Good.   ESTIMATED LENGTH OF STAY:  5-10 days.           ______________________________  Ellwood Dense, M.D.     DC/MEDQ  D:  12/05/2005  T:  12/05/2005  Job:  [id number]

## 2010-11-16 NOTE — Assessment & Plan Note (Signed)
INTERVAL HISTORY:  Ms. Joerger returns to clinic today for followup  evaluation.  She is a 70 year old African-American female with history of  total knee replacements in 2006.  She was admitted Nov 28, 2004 with  progressive lower extremity tightness and weakness since her second knee  operation in April 2006.  MRI scan of the brain October 19, 2004 showed white  matter lesions consistent with multiple sclerosis.  Thoracic spine films on  Nov 28, 2005 showed thoracic stenosis at T11-T12.  She underwent inferior  T10-T11 and T12 laminectomy and decompression and microdiskectomy Nov 28, 2005 by Dr. Newell Coral.  She was on a Decadron taper.  Chest CT was negative.  Troponin levels were negative.  Cardiology saw the patient and recommended  an outpatient stress test.  The patient was followed by Dr. Sandria Manly for workup  for multiple sclerosis.  No lumbar puncture was done while she was on the  rehabilitation unit and she was discharged home December 18, 2005.   Since discharge the patient has been re-hospitalized and underwent a lumbar  puncture on December 30, 2005.  She reports that Dr. Sandria Manly called her the last day  or so and asked her to come in for followup visit as her tests were positive  for multiple sclerosis.   The patient has continued in home health therapy and reports that she is due  to follow up and finish next week.  She walks with a walker 20-30 yards  before she needs to rest.  She uses a walker even inside the home.  She  reports that her blood sugars have been in the 116 and lower range with  hemoglobin A1c at 5.0.  She does use minimal amounts of Robaxin and Darvocet  as noted below and needs a refill on both of those in addition to her  lidocaine patch.  She is reporting no vision problems but does report  urgency and uses a Depends for incontinence.   MEDICATIONS:  1.  Diovan 160 mg b.i.d.  2.  Glucophage 500 mg in the morning and 1000 mg in the evening.  3.  Lidoderm patch 12 hours  on and 12 hours off daily.  4.  Atenolol 100 mg daily.  5.  Tylenol p.r.n.  6.  Robaxin 500 mg q.6 h. p.r.n. (0-3 per day).  7.  Darvocet-N 100 one tablet q.6 h. p.r.n. (0-3 per day).   REVIEW OF SYSTEMS:  Positive for night sweats, high blood sugars.   PHYSICAL EXAMINATION:  Well-appearing middle-aged adult female seated in a  manual wheelchair.  Blood pressure 148/80 with a pulse of 78, respiratory  rate 16 and O2 saturation 96% on room air.  She has 5/5 strength throughout  the bilateral upper extremities.  Lower extremity strength showed 4-/5  strength in hip flexion bilaterally and 4/5 strength in knee extension  bilaterally.  Sensation was intact to light touch throughout the bilateral  lower extremities.   IMPRESSION:  1.  Status post thoracic T10-T12 laminectomy and decompression with      microdiskectomy on Nov 28, 2005 for thoracic stenosis with myelopathy.  2.  Recent diagnosis of multiple sclerosis.  3.  Noninsulin-dependent diabetes mellitus.  4.  Hypertension.   In the office today we did refill the patient's Darvocet, Robaxin and  lidocaine patches.  She will be following up with Dr. Sandria Manly this evening  regarding her positive lumbar puncture for multiple sclerosis.  I have asked  her to contact her home health  therapist to see if they could warrant  continuing home health therapies for a bit longer period of time.  She has  difficulty getting transportation for outpatient therapies.  There  additionally may be some issues regarding the start of multiple sclerosis  treatment with Dr. Sandria Manly.   We will plan on seeing the patient in followup in this office in  approximately 2 months' time.  She will be following up with Dr. Newell Coral  this coming Friday for her postoperative visit.           ______________________________  Ellwood Dense, M.D.     DC/MedQ  D:  01/08/2006 15:27:46  T:  01/08/2006 21:50:56  Job #:  [id number]

## 2010-11-16 NOTE — Op Note (Signed)
NAME:  Laura Roberts, NATH NO.:  [id number]   MEDICAL RECORD NO.:  [id number]          PATIENT TYPE:  INP   LOCATION:  5014                         FACILITY:  [facility]   PHYSICIAN:  Elana Alm. Thurston Hole, M.D. DATE OF BIRTH:  09-Apr-1941   DATE OF PROCEDURE:  10/24/2004  DATE OF DISCHARGE:                                 OPERATIVE REPORT   PREOPERATIVE DIAGNOSIS:  Left knee degenerative joint disease.   POSTOPERATIVE DIAGNOSIS:  Left knee degenerative joint disease.   PROCEDURE:  1.  Left total knee replacement using DePuy cemented total knee system with      number 3 cemented femur, number 3 cemented tibia, with 10 mm      polyethylene RP tibial spacer and 35 mm polyethylene cemented patella.  2.  Left total knee computer assisted navigation.   SURGEON:  Elana Alm. Thurston Hole, M.D.   ASSISTANT:  Julien Girt, P.A.   ANESTHESIA:  Spinal.   OPERATIVE TIME:  1 hour 45 minutes.   COMPLICATIONS:  None.   DESCRIPTION OF PROCEDURE:  Ms. Harroun was brought to the operating room on  October 24, 2004, and placed on the operating table in supine position after a  femoral nerve block had been placed in the holding room by anesthesia.  She  received Ancef 1 gram IV preoperatively for prophylaxis.  After being placed  under general anesthesia, she had a Foley catheter placed under sterile  conditions.  Her left knee was examined.  She had range of motion from -5 to  125 degrees with mild varus deformity and a stable ligamentous exam with  normal patellar tracking.  The left leg was then prepped with DuraPrep and  draped using sterile technique.  The leg was exsanguinated and a thigh  tourniquet elevated to 375 mmHg.  Initially through a 15 cm longitudinal  incision based over the patella, initial exposure was made.  The underlying  subcutaneous tissues were incised along the skin incision.  A median  arthrotomy was performed revealing an excessive amount of normal appearing  joint  fluid.  The articular surfaces were inspected.  She had grade 4  changes medially, grade 3 changes laterally, and grade 3-4 changes in the  patellofemoral joint.  Osteophytes were removed from the femoral condyles  and tibial plateau.  The medial and lateral meniscal remnants were removed as well as the  anterior cruciate ligament.  At this point, two separate pins were placed in  the proximal tibia and in the distal femur for placement of the computer  assisted navigation apparatus.  These markers were then placed and the  navigation was activated.  After activation and registration, the range of  motion was noted on the computer as being range of motion from -5 to 125  degrees with 5 degrees of varus deformity.  At this point the computer  assisted navigation was used to help make a distal femoral cut resecting 10  mm off the distal femur with a perfect cut based on the navigation system.  After this was done, the size noted was felt to be a #3 and  then the #3  cutting jig was placed on the distal femur and these cuts were made and then  verified with the computer.  After this was done, the proximal tibial cut  was made resecting 10 mm of proximal tibia with a 0 degree slope, again  using the computer navigation system for a perfect cut.  After this cut was  made and verified, then the PCL box cutter was placed back on the distal  femur and this cut was made.  At this point, the proximal tibial keel cut  was made with a #3 size tibial baseplate and then with the #3 femoral trial  and placed a 10 mm polyethylene tibial spacer, taken through a range of  motion from 0 to 125 degrees with excellent correction of her varus  deformity to neutral and correction of her flexion deformity, as well, to 0  degrees.  After this was done, the patella was sized.  The 35 mm was found  to be an appropriate size and a recessed 8 mm resurfacing cut was made and  three locking holes were made.  The patellar  trial was placed and  patellofemoral tracking was evaluated and this was found to be normal.  At  this point, it was felt that all the trial components were of excellent  size, fit, and stability, and her deformities had been corrected.  The trial  components were then removed.  The knee was then jet lavage irrigated with 3  liters of saline solution.  The proximal tibia was then exposed and the #3  tibial baseplate with cement backing was hammered into position with an  excellent fit with excess cement being removed from around the edges.  The  #3 femoral component with cement backing was hammered into position also  with an excellent fit with excess cement being removed from around the  edges.  The 10 mm polyethylene RP tibial spacer was then placed on the  tibial baseplate.  The knee was taken through a range of motion and found to  be stable and her varus deformity corrected.  The 35 mm polyethylene patella  was then placed into its recess and held with a clamp.  After the cement  hardened, patellofemoral tracking was found to be normal.  At this point, it  was felt that all the components were of excellent size, fit, and stability.  The pins were removed from the computer navigation system.  The wound was  further irrigated with saline.  The tourniquet was released, venous bleeders  were cauterized.  The arthrotomy was closed with #1 Ethibond suture over two  medium Hemovac drains.  The subcutaneous tissues were closed with 0 and 2-0  Vicryl.  The subcuticular layer was closed with 3-0 Monocryl.  Steri-Strips  and sterile dressings were applied.  The Hemovac was injected with 0.25%  Marcaine with epinephrine and 4 mg morphine and clamped.  The tourniquet was  awakened, extubated, and taken to the recovery room in stable condition.  Needle and sponge counts were correct x 2 at the end of the case.       RAW/MEDQ  D:  10/26/2004  T:  10/26/2004  Job:  [id number]

## 2010-11-16 NOTE — H&P (Signed)
NAME:  Laura Roberts, Laura Roberts NO.:  [id number]   MEDICAL RECORD NO.:  [id number]          PATIENT TYPE:  INP   LOCATION:  3106                         FACILITY:  [facility]   PHYSICIAN:  Hewitt Shorts, M.D.DATE OF BIRTH:  October 18, 1940   DATE OF ADMISSION:  11/28/2005  DATE OF DISCHARGE:                                HISTORY & PHYSICAL   HISTORY OF PRESENT ILLNESS:  Patient is a 70 year old right-handed black  female who was evaluated for thoracic stenosis at the T11-12 level.[facility]  MRI  revealed alteration in signal from within the spinal cord.  Patient  explained that she had undergone a right total knee replacement September of  2005 and a left total knee replacement in April of 2006.  She had chronic  aching and weakness in her thighs and legs since the surgeries.  She  underwent physical therapy and then her orthopedist obtained MRI scans and  found evidence of thoracic stenosis.  Initial examination showed mild  weakness of the iliopsoas bilaterally.  Re-examination showed progression of  her weakness with increased weakness in the lower extremities.  Therefore,  decision was made to proceed with decompressive thoracic laminectomy and the  patient is admitted for such.   Her lumbar studies did show lumbar stenosis as well and she may eventually  require lumbar laminectomy for decompression but it is felt best to proceed  solely with the thoracic decompression at this time.   The patient has undergone neurologic electrodiagnostic studies with C. Lesia Sago, M.D., and neurology consultation with Dr. Avie Echevaria, who has  encouraged decompression of the thoracic spine.   ALLERGIES:  CODEINE.   CURRENT MEDICATIONS:  1.  Diovan 160 mg b.i.d.  2.  Metformin 1000 mg in the morning and 500 mg in the evening.  3.  Atenolol 50 mg daily.  4.  Aspirin 81 mg daily.   PAST MEDICAL HISTORY:  History of diabetes and hypertension for the past 11  years.  No history of  myocardial infarction, cancer, stroke, peptic ulcer  disease or lung disease.   PAST SURGICAL HISTORY:  Left carpal tunnel release as well as bilateral  total knee replacements.   FAMILY HISTORY:  Her parents have passed on.  Her mother had lung cancer.  Her father was an alcoholic.   SOCIAL HISTORY:  Patient is widowed.  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  She does not smoke, drink alcoholic beverages or have a  history of substance abuse.   REVIEW OF SYSTEMS:  Notable for that described in the history of present  illness and past medical history but is otherwise unremarkable.   PHYSICAL EXAMINATION:  GENERAL APPEARANCE:  Patient is a well-developed,  well-nourished black female in no acute distress.  VITAL SIGNS:  Temperature 96.4, pulse 77, blood pressure 161/89, respiratory  rate 18, height 5 feet 2 inches, weight 186 pounds.  LUNGS:  Clear to auscultation.  She has symmetric respiratory excursion.  CARDIOVASCULAR:  Regular rate and rhythm with S1 and S2.  No murmur.  ABDOMEN:  Soft, nondistended, bowel sounds are present.  EXTREMITIES:  No clubbing, cyanosis, or edema.  MUSCULOSKELETAL:  No tense palpation over the thoracic or lumbar spinous  processes or parathoracic or paralumbar musculature.  She is able to flex 90  degrees, able to extend 10 degrees.  She has no discomfort with flexion or  extension.  Straight leg raising is negative bilaterally.  NEUROLOGIC:  Paraparesis of the iliopsoas 4/5 bilaterally, quadriceps are 5  bilaterally, dorsiflexion is 4+ to 5/5 bilaterally, extensor hallucis longus  4 to 4+/5 bilaterally, plantar flexion 5/5 bilaterally.  Sensation is intact  to pinprick in the upper and lower extremities.  Reflexes are 2 at the  quadriceps and gastrocs, symmetrical bilaterally.  Toes are equivocal  bilaterally.  Gait and stance is limited due to chronic discomfort in the   knees.  She uses a rolling walker to give her support.   IMPRESSION:  1.  Thoracic stenosis at the T11-12 level.  2.  Lumbar stenosis at L3-4 and L4-5.  MRI reveals increasing in the cord at      the T11-12 level.  It is felt that her greatest dysfunction is arising      from the thoracic stenosis.   PLAN:  Patient will be admitted for a T11-T12 laminectomy for decompression  of the thoracic spinal cord.  She understands that she will also likely  require subsequent lumbar laminectomy for decompression.  We discussed the  nature of surgery, typical length of surgery, hospital stay and  recuperation.  Risks certainly of which are increased due to her history of  hypertension and diabetes and include risk of infection, bleeding, possible  need for transfusion, the risk of requiring further surgery because of  hematoma, the risk of spinal cord dysfunction and paralysis, the risk of  anesthetic complications including myocardial infarction, stroke, pneumonia  and death and the uncertainty of neurologic recovery.  She understands that  there may already be irreversible damage to the spinal cord from which she  may not recover and in fact, even with decompression, her neurologic  dysfunction may progress.  Understanding all this, she wishes to proceed  with surgery and is admitted for such.      Hewitt Shorts, M.D.  Electronically Signed     RWN/MEDQ  D:  11/29/2005  T:  11/29/2005  Job:  [id number]

## 2010-11-16 NOTE — Op Note (Signed)
NAME:  Laura Roberts, Laura Roberts                        ACCOUNT NO.:  [id number]   MEDICAL RECORD NO.:  [id number]                   PATIENT TYPE:  INP   LOCATION:  2889                                 FACILITY:  [facility]   PHYSICIAN:  Elana Alm. Thurston Hole, M.D.              DATE OF BIRTH:  January 14, 1941   DATE OF PROCEDURE:  03/23/2003  DATE OF DISCHARGE:                                 OPERATIVE REPORT   PREOPERATIVE DIAGNOSIS:  Right knee degenerative joint disease.   POSTOPERATIVE DIAGNOSIS:  Right knee degenerative joint disease.   PROCEDURE:  Right total knee replacement using Osteonics Scorpio total knee  system with #5 cemented femoral component, #7 cemented tibial component with  12-mm polyethylene flex tibial spacer, and 26-mm polyethylene cemented  patella.   SURGEON:  Elana Alm. Thurston Hole, M.D.   ASSISTANT:  Julien Girt, P.A.   ANESTHESIA:  General.   OPERATIVE TIME:  1 hour and 30 minutes.   COMPLICATIONS:  None.   DESCRIPTION OF PROCEDURE:  Laura Roberts was brought to the operating room on  March 23, 2003 and placed on the operating table in supine position  after a femoral nerve block had been placed in the holding room for  postoperative pain. After being placed under general anesthesia, the right  knee was examined under anesthesia. Range of motion from -5 to 120 degrees  with moderate varus deformity. Knee stable to ligamentous exam with normal  patellar tracking. She had a Foley catheter placed under sterile conditions  and received Ancef 1 g IV preoperatively for prophylaxis. The right leg was  prepped using sterile Duraprep and draped using sterile technique. Leg was  exsanguinated and thigh tourniquet elevated at 375 mm. Initially through a  15-cm longitudinal incision based over the patella, initial exposure was  made. The line of  subcutaneous tissues were incised along the skin  incision. A median arthrotomy was performed revealing an excessive amount of  normal  appearing joint fluid. The articular surfaces were inspected. She was  found to have Grade IV changes medially, Grade III changes laterally, and  Grade III and IV changes in the patellofemoral joint. The medial and lateral  meniscal remnants were removed as well as the anterior cruciate ligament.  Osteophytes were removed off the femoral condyles and tibial plateau. An  intramedullary drill was then drilled up the femoral canal for placement of  the distal femoral cutting jig which was placed in the appropriate amount of  rotation, and a distal 10-mm cut was made. The distal femur was incised. A  #5 was found to be the appropriate size, and the #5 cutting jig was placed,  and then these cuts were made. After this was done, the proximal tibial was  exposed. The tibial spines were removed with an oscillating saw.  Intramedullary drill drilled down the tibial canal for placement of the  proximal tibial cutting guide which was placed in the  appropriate amount of  rotation, and a proximal 6-mm cut was made. After this was done, the Scorpio  PCL cutter was placed back on the distal femur, and these cuts were made.  After this was done, a #5 femoral trial was placed. The #7 tibial base plate  trial was placed, and with a 12-mm polyethylene flex tibial spacer, there  was found to be excellent alignment and stability, range of motion 0 to 125  degrees. The tibial base plate was then marked for rotation, and the keel  cut was made. After this was done, the patella was sized. A 26-mm was found  to be the appropriate size, and a recessed 10-mm x 26-mm cut was made, and  three locking holes were placed. After this was done, it was felt that all  of the trial components were of excellent size, fit, and stability, and then  they were removed. The knee was then gently lavage irrigated with 3 liters  of saline solution. The proximal tibial was then exposed, and the #7 tibial  base plate with cement backing  was then hammered into position with an  excellent fit with excess cement being removed from around the edges. The #5  femoral component with cement backing was hammered into position also with  an excellent fit with excess cement being removed from around the edges. The  12-mm polyethylene flex tibial spacer was then locked on the tibial base  plate, the knee taken through a range of motion 0 to 125 degrees with  excellent stability and no lift off on the tray. The 26-mm polyethylene  cement backed patella was then placed into its recessed hole and held there  with a clamp. After the cement hardened, patellofemoral tracking was  evaluated, and this was found to be normal with no need for lateral release.  At this point, it was felt that all of the components were of excellent  size, fit, and stability. The knee was further irrigated with antibiotic  solution, and then the median arthrotomy was closed with #1 Ethibond suture  over two median Hemovac drains. Subcutaneous tissue was closed with 0 and 2-  0 Vicryl, skin closed with skin staples. Sterile dressings were applied. The  Hemovac injected with 0.25% Marcaine with epinephrine and clamped.  Tourniquet was released. Long leg splint applied. The patient awakened and  taken to recovery room in stable condition. Needle and sponge count was  correct x2 at the end of the case.                                               Robert A. Thurston Hole, M.D.    RAW/MEDQ  D:  03/23/2003  T:  03/24/2003  Job:  [id number]

## 2010-12-21 ENCOUNTER — Other Ambulatory Visit: Payer: Self-pay | Admitting: Neurosurgery

## 2010-12-21 DIAGNOSIS — IMO0002 Reserved for concepts with insufficient information to code with codable children: Secondary | ICD-10-CM

## 2010-12-21 DIAGNOSIS — M4807 Spinal stenosis, lumbosacral region: Secondary | ICD-10-CM

## 2010-12-21 DIAGNOSIS — M479 Spondylosis, unspecified: Secondary | ICD-10-CM

## 2011-01-03 ENCOUNTER — Ambulatory Visit
Admission: RE | Admit: 2011-01-03 | Discharge: 2011-01-03 | Disposition: A | Discharge status: Home / Self Care | Payer: Medicare Other | Source: Ambulatory Visit | Attending: Neurosurgery | Admitting: Neurosurgery

## 2011-01-03 DIAGNOSIS — M4807 Spinal stenosis, lumbosacral region: Secondary | ICD-10-CM

## 2011-01-03 DIAGNOSIS — M479 Spondylosis, unspecified: Secondary | ICD-10-CM

## 2011-01-03 DIAGNOSIS — IMO0002 Reserved for concepts with insufficient information to code with codable children: Secondary | ICD-10-CM

## 2011-01-16 ENCOUNTER — Other Ambulatory Visit: Payer: Self-pay | Admitting: Internal Medicine

## 2011-01-16 DIAGNOSIS — E049 Nontoxic goiter, unspecified: Secondary | ICD-10-CM

## 2011-01-17 ENCOUNTER — Ambulatory Visit
Admission: RE | Admit: 2011-01-17 | Discharge: 2011-01-17 | Disposition: A | Discharge status: Home / Self Care | Payer: Medicare Other | Source: Ambulatory Visit | Attending: Internal Medicine | Admitting: Internal Medicine

## 2011-01-17 DIAGNOSIS — E049 Nontoxic goiter, unspecified: Secondary | ICD-10-CM

## 2011-01-21 ENCOUNTER — Other Ambulatory Visit (HOSPITAL_COMMUNITY): Payer: Self-pay | Admitting: Internal Medicine

## 2011-01-21 DIAGNOSIS — E041 Nontoxic single thyroid nodule: Secondary | ICD-10-CM

## 2011-02-06 ENCOUNTER — Encounter (HOSPITAL_COMMUNITY)
Admission: RE | Admit: 2011-02-06 | Discharge: 2011-02-06 | Disposition: A | Discharge status: Home / Self Care | Payer: Medicare Other | Source: Ambulatory Visit | Attending: Internal Medicine | Admitting: Internal Medicine

## 2011-02-06 DIAGNOSIS — E042 Nontoxic multinodular goiter: Secondary | ICD-10-CM | POA: Insufficient documentation

## 2011-02-06 DIAGNOSIS — E041 Nontoxic single thyroid nodule: Secondary | ICD-10-CM

## 2011-02-07 ENCOUNTER — Encounter (HOSPITAL_COMMUNITY)
Admission: RE | Admit: 2011-02-07 | Discharge: 2011-02-07 | Disposition: A | Discharge status: Home / Self Care | Payer: Medicare Other | Source: Ambulatory Visit | Attending: Internal Medicine | Admitting: Internal Medicine

## 2011-02-07 MED ORDER — SODIUM IODIDE I 131 CAPSULE
7.6000 | Freq: Once | INTRAVENOUS | Status: AC | PRN
  Administered 2011-02-06: 7.6 via ORAL

## 2011-02-07 MED ORDER — SODIUM PERTECHNETATE TC 99M INJECTION
10.0000 | Freq: Once | INTRAVENOUS | Status: AC | PRN
  Administered 2011-02-07: 10 via INTRAVENOUS

## 2012-07-21 ENCOUNTER — Emergency Department (HOSPITAL_COMMUNITY): Payer: Medicare Other

## 2012-07-21 ENCOUNTER — Inpatient Hospital Stay (HOSPITAL_COMMUNITY)
Admission: EM | Admit: 2012-07-21 | Discharge: 2012-07-23 | DRG: 058 | Disposition: A | Discharge status: Rehabilitation Facility | Payer: Medicare Other | Attending: Internal Medicine | Admitting: Internal Medicine

## 2012-07-21 ENCOUNTER — Encounter (HOSPITAL_COMMUNITY): Payer: Self-pay | Admitting: Emergency Medicine

## 2012-07-21 DIAGNOSIS — G35 Multiple sclerosis: Principal | ICD-10-CM

## 2012-07-21 DIAGNOSIS — M5030 Other cervical disc degeneration, unspecified cervical region: Secondary | ICD-10-CM | POA: Diagnosis present

## 2012-07-21 DIAGNOSIS — E785 Hyperlipidemia, unspecified: Secondary | ICD-10-CM

## 2012-07-21 DIAGNOSIS — E669 Obesity, unspecified: Secondary | ICD-10-CM | POA: Diagnosis present

## 2012-07-21 DIAGNOSIS — J209 Acute bronchitis, unspecified: Secondary | ICD-10-CM | POA: Diagnosis present

## 2012-07-21 DIAGNOSIS — M47812 Spondylosis without myelopathy or radiculopathy, cervical region: Secondary | ICD-10-CM | POA: Diagnosis present

## 2012-07-21 DIAGNOSIS — R059 Cough, unspecified: Secondary | ICD-10-CM

## 2012-07-21 DIAGNOSIS — R0602 Shortness of breath: Secondary | ICD-10-CM | POA: Diagnosis present

## 2012-07-21 DIAGNOSIS — R5381 Other malaise: Secondary | ICD-10-CM

## 2012-07-21 DIAGNOSIS — Z96659 Presence of unspecified artificial knee joint: Secondary | ICD-10-CM

## 2012-07-21 DIAGNOSIS — J40 Bronchitis, not specified as acute or chronic: Secondary | ICD-10-CM

## 2012-07-21 DIAGNOSIS — Z6836 Body mass index (BMI) 36.0-36.9, adult: Secondary | ICD-10-CM

## 2012-07-21 DIAGNOSIS — R05 Cough: Secondary | ICD-10-CM

## 2012-07-21 DIAGNOSIS — R000 Tachycardia, unspecified: Secondary | ICD-10-CM

## 2012-07-21 DIAGNOSIS — M47817 Spondylosis without myelopathy or radiculopathy, lumbosacral region: Secondary | ICD-10-CM | POA: Diagnosis present

## 2012-07-21 DIAGNOSIS — M47814 Spondylosis without myelopathy or radiculopathy, thoracic region: Secondary | ICD-10-CM | POA: Diagnosis present

## 2012-07-21 DIAGNOSIS — M5137 Other intervertebral disc degeneration, lumbosacral region: Secondary | ICD-10-CM | POA: Diagnosis present

## 2012-07-21 DIAGNOSIS — G35D Multiple sclerosis, unspecified: Principal | ICD-10-CM | POA: Diagnosis present

## 2012-07-21 DIAGNOSIS — R531 Weakness: Secondary | ICD-10-CM

## 2012-07-21 DIAGNOSIS — Z79899 Other long term (current) drug therapy: Secondary | ICD-10-CM

## 2012-07-21 DIAGNOSIS — J189 Pneumonia, unspecified organism: Secondary | ICD-10-CM | POA: Diagnosis present

## 2012-07-21 DIAGNOSIS — Z66 Do not resuscitate: Secondary | ICD-10-CM | POA: Diagnosis present

## 2012-07-21 DIAGNOSIS — M51379 Other intervertebral disc degeneration, lumbosacral region without mention of lumbar back pain or lower extremity pain: Secondary | ICD-10-CM | POA: Diagnosis present

## 2012-07-21 DIAGNOSIS — E119 Type 2 diabetes mellitus without complications: Secondary | ICD-10-CM

## 2012-07-21 DIAGNOSIS — K219 Gastro-esophageal reflux disease without esophagitis: Secondary | ICD-10-CM

## 2012-07-21 DIAGNOSIS — I10 Essential (primary) hypertension: Secondary | ICD-10-CM

## 2012-07-21 DIAGNOSIS — IMO0002 Reserved for concepts with insufficient information to code with codable children: Secondary | ICD-10-CM | POA: Diagnosis present

## 2012-07-21 DIAGNOSIS — G8220 Paraplegia, unspecified: Secondary | ICD-10-CM | POA: Diagnosis present

## 2012-07-21 HISTORY — DX: Multiple sclerosis: G35

## 2012-07-21 HISTORY — DX: Essential (primary) hypertension: I10

## 2012-07-21 HISTORY — DX: Type 2 diabetes mellitus without complications: E11.9

## 2012-07-21 LAB — CBC WITH DIFFERENTIAL/PLATELET
Eosinophils Relative: 4 % (ref 0–5)
Lymphocytes Relative: 26 % (ref 12–46)
MCV: 88.3 fL (ref 78.0–100.0)
Monocytes Relative: 8 % (ref 3–12)
Neutrophils Relative %: 61 % (ref 43–77)
Platelets: 277 10*3/uL (ref 150–400)
RBC: 4.54 MIL/uL (ref 3.87–5.11)
WBC: 10.7 10*3/uL — ABNORMAL HIGH (ref 4.0–10.5)

## 2012-07-21 LAB — CBC
HCT: 38.4 % (ref 36.0–46.0)
MCHC: 32.8 g/dL (ref 30.0–36.0)
MCV: 87.3 fL (ref 78.0–100.0)
RDW: 14.7 % (ref 11.5–15.5)

## 2012-07-21 LAB — URINALYSIS, ROUTINE W REFLEX MICROSCOPIC
Leukocytes, UA: NEGATIVE
Nitrite: NEGATIVE
Specific Gravity, Urine: 1.042 — ABNORMAL HIGH (ref 1.005–1.030)
pH: 5 (ref 5.0–8.0)

## 2012-07-21 LAB — BASIC METABOLIC PANEL
CO2: 26 mEq/L (ref 19–32)
Calcium: 9.7 mg/dL (ref 8.4–10.5)
GFR calc non Af Amer: 84 mL/min — ABNORMAL LOW (ref >90)
Sodium: 140 mEq/L (ref 135–145)

## 2012-07-21 LAB — GLUCOSE, CAPILLARY: Glucose-Capillary: 326 mg/dL — ABNORMAL HIGH (ref 70–99)

## 2012-07-21 LAB — URINE MICROSCOPIC-ADD ON

## 2012-07-21 LAB — PHOSPHORUS: Phosphorus: 3 mg/dL (ref 2.3–4.6)

## 2012-07-21 LAB — MAGNESIUM: Magnesium: 1.8 mg/dL (ref 1.5–2.5)

## 2012-07-21 MED ORDER — CANAGLIFLOZIN 100 MG PO TABS
100.0000 mg | ORAL_TABLET | Freq: Every day | ORAL | Status: DC
  Administered 2012-07-22 – 2012-07-23 (×2): 100 mg via ORAL
  Filled 2012-07-21 (×3): qty 1

## 2012-07-21 MED ORDER — IPRATROPIUM BROMIDE 0.02 % IN SOLN
0.5000 mg | Freq: Four times a day (QID) | RESPIRATORY_TRACT | Status: DC
  Administered 2012-07-21 – 2012-07-22 (×4): 0.5 mg via RESPIRATORY_TRACT
  Filled 2012-07-21 (×4): qty 2.5

## 2012-07-21 MED ORDER — ACETAMINOPHEN 325 MG PO TABS
650.0000 mg | ORAL_TABLET | Freq: Four times a day (QID) | ORAL | Status: DC | PRN
  Administered 2012-07-22: 650 mg via ORAL
  Filled 2012-07-21: qty 2

## 2012-07-21 MED ORDER — ACETAMINOPHEN 650 MG RE SUPP: 650.0000 mg | Freq: Four times a day (QID) | RECTAL | Status: DC | PRN

## 2012-07-21 MED ORDER — IBUPROFEN 800 MG PO TABS
800.0000 mg | ORAL_TABLET | Freq: Once | ORAL | Status: AC
  Administered 2012-07-21: 800 mg via ORAL
  Filled 2012-07-21: qty 1

## 2012-07-21 MED ORDER — GABAPENTIN 300 MG PO CAPS
300.0000 mg | ORAL_CAPSULE | Freq: Four times a day (QID) | ORAL | Status: DC
  Administered 2012-07-22 – 2012-07-23 (×7): 300 mg via ORAL
  Filled 2012-07-21 (×9): qty 1

## 2012-07-21 MED ORDER — ONDANSETRON HCL 4 MG PO TABS: 4.0000 mg | ORAL_TABLET | Freq: Four times a day (QID) | ORAL | Status: DC | PRN

## 2012-07-21 MED ORDER — DULOXETINE HCL 60 MG PO CPEP
60.0000 mg | ORAL_CAPSULE | Freq: Every day | ORAL | Status: DC
  Administered 2012-07-22 – 2012-07-23 (×3): 60 mg via ORAL
  Filled 2012-07-21 (×3): qty 1

## 2012-07-21 MED ORDER — GUAIFENESIN-DM 100-10 MG/5ML PO SYRP
5.0000 mL | ORAL_SOLUTION | ORAL | Status: DC | PRN
  Administered 2012-07-21 – 2012-07-23 (×6): 5 mL via ORAL
  Filled 2012-07-21 (×6): qty 5

## 2012-07-21 MED ORDER — INSULIN ASPART 100 UNIT/ML ~~LOC~~ SOLN
0.0000 [IU] | Freq: Every day | SUBCUTANEOUS | Status: DC
  Administered 2012-07-21: 4 [IU] via SUBCUTANEOUS
  Administered 2012-07-22: 2 [IU] via SUBCUTANEOUS

## 2012-07-21 MED ORDER — METHYLPREDNISOLONE SODIUM SUCC 125 MG IJ SOLR
125.0000 mg | Freq: Once | INTRAMUSCULAR | Status: AC
  Administered 2012-07-21: 125 mg via INTRAVENOUS
  Filled 2012-07-21: qty 2

## 2012-07-21 MED ORDER — AMLODIPINE BESYLATE 5 MG PO TABS
5.0000 mg | ORAL_TABLET | Freq: Every day | ORAL | Status: DC
  Administered 2012-07-22 – 2012-07-23 (×3): 5 mg via ORAL
  Filled 2012-07-21 (×3): qty 1

## 2012-07-21 MED ORDER — ALBUTEROL SULFATE (5 MG/ML) 0.5% IN NEBU
2.5000 mg | INHALATION_SOLUTION | Freq: Four times a day (QID) | RESPIRATORY_TRACT | Status: DC
  Administered 2012-07-21 – 2012-07-22 (×4): 2.5 mg via RESPIRATORY_TRACT
  Filled 2012-07-21 (×4): qty 0.5

## 2012-07-21 MED ORDER — INSULIN ASPART 100 UNIT/ML ~~LOC~~ SOLN
0.0000 [IU] | Freq: Three times a day (TID) | SUBCUTANEOUS | Status: DC
  Administered 2012-07-22: 5 [IU] via SUBCUTANEOUS
  Administered 2012-07-22 (×2): 8 [IU] via SUBCUTANEOUS
  Administered 2012-07-23: 3 [IU] via SUBCUTANEOUS
  Administered 2012-07-23: 5 [IU] via SUBCUTANEOUS

## 2012-07-21 MED ORDER — ENOXAPARIN SODIUM 40 MG/0.4ML ~~LOC~~ SOLN
40.0000 mg | SUBCUTANEOUS | Status: DC
  Administered 2012-07-22 (×2): 40 mg via SUBCUTANEOUS
  Filled 2012-07-21 (×3): qty 0.4

## 2012-07-21 MED ORDER — POLYETHYLENE GLYCOL 3350 17 G PO PACK
17.0000 g | PACK | Freq: Every day | ORAL | Status: DC | PRN
  Filled 2012-07-21: qty 1

## 2012-07-21 MED ORDER — MORPHINE SULFATE 2 MG/ML IJ SOLN: 1.0000 mg | INTRAMUSCULAR | Status: DC | PRN

## 2012-07-21 MED ORDER — IRBESARTAN 300 MG PO TABS
300.0000 mg | ORAL_TABLET | Freq: Every day | ORAL | Status: DC
  Administered 2012-07-22 – 2012-07-23 (×3): 300 mg via ORAL
  Filled 2012-07-21 (×3): qty 1

## 2012-07-21 MED ORDER — GLIMEPIRIDE 4 MG PO TABS
4.0000 mg | ORAL_TABLET | Freq: Two times a day (BID) | ORAL | Status: DC
  Administered 2012-07-22 – 2012-07-23 (×4): 4 mg via ORAL
  Filled 2012-07-21 (×5): qty 1

## 2012-07-21 MED ORDER — SODIUM CHLORIDE 0.9 % IV SOLN
500.0000 mg | Freq: Every day | INTRAVENOUS | Status: DC
  Administered 2012-07-22 (×2): 500 mg via INTRAVENOUS
  Filled 2012-07-21 (×4): qty 4

## 2012-07-21 MED ORDER — ONDANSETRON HCL 4 MG/2ML IJ SOLN: 4.0000 mg | Freq: Four times a day (QID) | INTRAMUSCULAR | Status: DC | PRN

## 2012-07-21 MED ORDER — ATENOLOL 50 MG PO TABS
50.0000 mg | ORAL_TABLET | Freq: Every day | ORAL | Status: DC
  Administered 2012-07-22 – 2012-07-23 (×3): 50 mg via ORAL
  Filled 2012-07-21 (×3): qty 1

## 2012-07-21 MED ORDER — SODIUM CHLORIDE 0.9 % IV SOLN
INTRAVENOUS | Status: DC
  Administered 2012-07-21 – 2012-07-22 (×2): via INTRAVENOUS

## 2012-07-21 MED ORDER — SODIUM CHLORIDE 0.9 % IJ SOLN
3.0000 mL | Freq: Two times a day (BID) | INTRAMUSCULAR | Status: DC
  Administered 2012-07-22 – 2012-07-23 (×2): 3 mL via INTRAVENOUS

## 2012-07-21 MED ORDER — ALBUTEROL SULFATE (5 MG/ML) 0.5% IN NEBU
5.0000 mg | INHALATION_SOLUTION | Freq: Once | RESPIRATORY_TRACT | Status: AC
  Administered 2012-07-21: 5 mg via RESPIRATORY_TRACT
  Filled 2012-07-21: qty 1

## 2012-07-21 MED ORDER — INTERFERON BETA-1A 44 MCG/0.5ML ~~LOC~~ SOLN
44.0000 ug | SUBCUTANEOUS | Status: DC
  Administered 2012-07-22: 44 ug via SUBCUTANEOUS
  Filled 2012-07-21 (×3): qty 0.5

## 2012-07-21 MED ORDER — SIMVASTATIN 20 MG PO TABS
20.0000 mg | ORAL_TABLET | Freq: Every day | ORAL | Status: DC
  Administered 2012-07-22: 20 mg via ORAL
  Filled 2012-07-21 (×2): qty 1

## 2012-07-21 MED ORDER — IPRATROPIUM BROMIDE 0.02 % IN SOLN
0.5000 mg | Freq: Once | RESPIRATORY_TRACT | Status: AC
  Administered 2012-07-21: 0.5 mg via RESPIRATORY_TRACT
  Filled 2012-07-21: qty 2.5

## 2012-07-21 MED ORDER — LEVOFLOXACIN IN D5W 500 MG/100ML IV SOLN
500.0000 mg | INTRAVENOUS | Status: DC
  Administered 2012-07-22 (×2): 500 mg via INTRAVENOUS
  Filled 2012-07-21 (×3): qty 100

## 2012-07-21 NOTE — ED Notes (Signed)
Pt reports decreased ability to move lower extremities. BLE are cool to touch, 2+ pitting edema. Sensation intact. Also c/o cough, lower back pain radiating down legs.

## 2012-07-21 NOTE — ED Notes (Signed)
Pt c/o increasing weakness with inability to walk x 1 week; pt with cough and and hx of MS; Dr Sandria Manly called and thinks pt having MS crisis and will need chest xray, bloodwork, MRI, and likely admission for steroids for MS attack; pt denies injury

## 2012-07-21 NOTE — ED Notes (Addendum)
Ordered patient carb modified tray. Pt is alert and oriented. Was c/o back pain. Given ibuprofen. Waiting for admitting MD

## 2012-07-21 NOTE — Consult Note (Signed)
NEURO HOSPITALIST CONSULT NOTE    Reason for Consult: MS exacerbation.  HPI:                                                                                                                                          Laura Roberts is an 72 y.o. female female with multiple sclerosis formally diagnosed 7 years ago, and hypertension, who was evaluated by her neurologist today as the result of increasing bilateral lower extremity weakness over the last few days. She is on rebif and seems to be very adherent to treatment. Laura Roberts stated that she is typically able to move around with a walker which allows her to perform her activities of daily living, but over the last couple of days the legs had gotten so weak that she can not even with a walker. She denies numbness or tingling of her lower extremities. Has bladder incontinence that is not new. Saw her neurologist today and was advised to cometo the ER for treatment of MS exacerbation. She expressed that she thinks that her last MS relapse was around thanksgiving day this year. Denies vertigo, double vision, slurred speech, painful visual loss, or cramps. She reports some upper respiratory symptoms with chills in the past few days. Denies urinary symptoms , fever, or rash.  Past Medical History  Diagnosis Date  . Hypertension   . Multiple sclerosis     History reviewed. No pertinent past surgical history.  History reviewed. No pertinent family history.  Family History: non contributory.    Social History:  reports that she has never smoked. She does not have any smokeless tobacco history on file. She reports that she does not drink alcohol or use illicit drugs.  Allergies  Allergen Reactions  . Codeine Other (See Comments)    Difficult breathing and skin problem  . Ultram (Tramadol) Other (See Comments)    Difficult breathing and skin peeling    MEDICATIONS:                                                                                                                      I have reviewed the patient's current medications.   ROS:  History obtained from the patient  General ROS: negative for fever, night sweats, weight gain or weight loss Psychological ROS: negative for - behavioral disorder, hallucinations, mood swings or suicidal ideation Ophthalmic ROS: negative for - blurry vision, double vision, eye pain or loss of vision ENT ROS: negative for - epistaxis, nasal discharge, oral lesions, sore throat, tinnitus or vertigo Allergy and Immunology ROS: negative for - hives or itchy/watery eyes Hematological and Lymphatic ROS: negative for - bleeding problems, bruising or swollen lymph nodes Endocrine ROS: negative for - galactorrhea, hair pattern changes, polydipsia/polyuria or temperature intolerance Respiratory ROS: negative for  hemoptysis, shortness of breath or wheezing Cardiovascular ROS: negative for - chest pain, dyspnea on exertion, edema or irregular heartbeat Gastrointestinal ROS: negative for - abdominal pain, diarrhea, hematemesis, nausea/vomiting or stool incontinence Genito-Urinary ROS: negative for - dysuria, hematuria, incontinence or urinary frequency/urgency Musculoskeletal ROS: negative for - joint swelling or muscular weakness Neurological ROS: as noted in HPI Dermatological ROS: negative for rash and skin lesion changes  Physical exam: Blood pressure 119/70, pulse 114, temperature 98.1 F (36.7 C), temperature source Oral, resp. rate 18, SpO2 95.00%. Head: normocephalic. Neck: supple, no bruits. Cardiac: no murmurs. Lungs; clear. Abdomen: soft. Extremities: no edema.  Neurologic Examination:                                                                                                      Mental Status: Alert, awake, oriented x 3,  thought content appropriate.  Speech fluent without evidence of aphasia.  Able to follow 3 step commands without difficulty. Cranial Nerves: II: Discs flat bilaterally; Visual fields grossly normal, pupils equal, round, reactive to light and accommodation III,IV, VI: ptosis not present, extra-ocular motions intact bilaterally V,VII: smile symmetric, facial light touch sensation normal bilaterally VIII: hearing normal bilaterally IX,X: gag reflex present XI: bilateral shoulder shrug XII: midline tongue extension Motor:  5/5 upper extremities. 1/5 bilateral lower extremities. No atrophy noted Sensory: Pinprick and light touch intact throughout, bilaterally Deep Tendon Reflexes: 1+ and symmetric throughout Plantars: Right: upgoing.   Left: downgoing Cerebellar: normal finger-to-nose,  normal heel-to-shin test Gait: patient can not walk. CV: pulses palpable throughout     No results found for this basename: cbc, bmp, coags, chol, tri, ldl, hga1c    Results for orders placed during the hospital encounter of 07/21/12 (from the past 48 hour(s))  BASIC METABOLIC PANEL     Status: Abnormal   Collection Time   07/21/12 11:32 AM      Component Value Range Comment   Sodium 140  135 - 145 mEq/L    Potassium 4.4  3.5 - 5.1 mEq/L    Chloride 100  96 - 112 mEq/L    CO2 26  19 - 32 mEq/L    Glucose, Bld 147 (*) 70 - 99 mg/dL    BUN 16  6 - 23 mg/dL    Creatinine, Ser 1.61  0.50 - 1.10 mg/dL    Calcium 9.7  8.4 - 09.6 mg/dL    GFR calc non Af Amer 84 (*) >90 mL/min  GFR calc Af Amer >90  >90 mL/min   CBC WITH DIFFERENTIAL     Status: Abnormal   Collection Time   07/21/12 11:32 AM      Component Value Range Comment   WBC 10.7 (*) 4.0 - 10.5 K/uL    RBC 4.54  3.87 - 5.11 MIL/uL    Hemoglobin 13.0  12.0 - 15.0 g/dL    HCT 16.1  09.6 - 04.5 %    MCV 88.3  78.0 - 100.0 fL    MCH 28.6  26.0 - 34.0 pg    MCHC 32.4  30.0 - 36.0 g/dL    RDW 40.9  81.1 - 91.4 %    Platelets 277  150 - 400  K/uL    Neutrophils Relative 61  43 - 77 %    Lymphocytes Relative 26  12 - 46 %    Monocytes Relative 8  3 - 12 %    Eosinophils Relative 4  0 - 5 %    Basophils Relative 1  0 - 1 %    Neutro Abs 6.5  1.7 - 7.7 K/uL    Lymphs Abs 2.8  0.7 - 4.0 K/uL    Monocytes Absolute 0.9  0.1 - 1.0 K/uL    Eosinophils Absolute 0.4  0.0 - 0.7 K/uL    Basophils Absolute 0.1  0.0 - 0.1 K/uL    Smear Review MORPHOLOGY UNREMARKABLE     URINALYSIS, ROUTINE W REFLEX MICROSCOPIC     Status: Abnormal   Collection Time   07/21/12 12:23 PM      Component Value Range Comment   Color, Urine YELLOW  YELLOW    APPearance CLEAR  CLEAR    Specific Gravity, Urine 1.042 (*) 1.005 - 1.030    pH 5.0  5.0 - 8.0    Glucose, UA >1000 (*) NEGATIVE mg/dL    Hgb urine dipstick NEGATIVE  NEGATIVE    Bilirubin Urine NEGATIVE  NEGATIVE    Ketones, ur NEGATIVE  NEGATIVE mg/dL    Protein, ur NEGATIVE  NEGATIVE mg/dL    Urobilinogen, UA 0.2  0.0 - 1.0 mg/dL    Nitrite NEGATIVE  NEGATIVE    Leukocytes, UA NEGATIVE  NEGATIVE   URINE MICROSCOPIC-ADD ON     Status: Abnormal   Collection Time   07/21/12 12:23 PM      Component Value Range Comment   Squamous Epithelial / LPF FEW (*) RARE    WBC, UA 0-2  <3 WBC/hpf    Bacteria, UA RARE  RARE     Dg Chest Port 1 View  07/21/2012  *RADIOLOGY REPORT*  Clinical Data: Cough and short of breath  PORTABLE CHEST - 1 VIEW  Comparison: 11/28/2005  Findings: Prominent  heart size without heart failure.  Lungs are clear without infiltrate or effusion.  IMPRESSION: No acute abnormality.   Original Report Authenticated By: Janeece Riggers, M.D.      Assessment/Plan: MS exacerbation. Admit for IV solumedrol for 3 days.  Wyatt Portela, MD [facility] [phone]  07/21/2012, 5:43 PM

## 2012-07-21 NOTE — ED Notes (Signed)
Waiting for bed placement. Called flow manager, states some beds dirty as soon as cleaned pt should be assigned. Dinner tray is ordered. Pharmacy coming to talk with patient about MS meds she has that the hospital does not stock. Pt is watching TV.

## 2012-07-21 NOTE — ED Provider Notes (Signed)
History     CSN: [id number]  Arrival date & time 07/21/12  4098   First MD Initiated Contact with Patient 07/21/12 1041      Chief Complaint  Patient presents with  . Weakness     Patient is a 72 y.o. female presenting with weakness. The history is provided by the patient.  Weakness The primary symptoms include nausea. Primary symptoms do not include headaches or fever. The symptoms began more than 1 week ago. The symptoms are worsening. The neurological symptoms are diffuse.  Additional symptoms include weakness.  rest improves symptoms Walking worsens symptoms  Pt presents for two complaints She has had cough for over 2 weeks. - denies cp/sob.  She reports productive white sputum.  She has not had this evaluated.  No fevers are reported  She also reports increasing weakness/fatigue in her lower extremities.  She reports having h/o MS and this may be causing her weakness.  She reports she uses a walker but now is unable to due to weakness.  No UE weakness is reported on my evaluation  Past Medical History  Diagnosis Date  . Hypertension   . Multiple sclerosis     History reviewed. No pertinent past surgical history.  History reviewed. No pertinent family history.  History  Substance Use Topics  . Smoking status: Never Smoker   . Smokeless tobacco: Not on file  . Alcohol Use: No    OB History    Grav Para Term Preterm Abortions TAB SAB Ect Mult Living                  Review of Systems  Constitutional: Negative for fever.  Respiratory: Positive for cough.   Cardiovascular: Negative for chest pain.  Gastrointestinal: Positive for nausea. Negative for abdominal pain.  Musculoskeletal: Positive for back pain.  Neurological: Positive for weakness. Negative for headaches.  Psychiatric/Behavioral: Negative for agitation.  All other systems reviewed and are negative.    Allergies  Codeine and Ultram  Home Medications   Current Outpatient Rx  Name  Route   Sig  Dispense  Refill  . AMLODIPINE BESYLATE 5 MG PO TABS   Oral   Take 5 mg by mouth daily.         . ATENOLOL 50 MG PO TABS   Oral   Take 50 mg by mouth daily.         Marland Kitchen CALCIUM CARBONATE ANTACID 500 MG PO CHEW   Oral   Chew 1 tablet by mouth daily as needed. For heartburn         . CALCIUM + D PO   Oral   Take 0.5 tablets by mouth 2 (two) times daily.         Marland Kitchen CANAGLIFLOZIN 100 MG PO TABS   Oral   Take 100 mg by mouth daily.         . DULOXETINE HCL 60 MG PO CPEP   Oral   Take 60 mg by mouth daily.         Marland Kitchen GABAPENTIN 300 MG PO CAPS   Oral   Take 300 mg by mouth 4 (four) times daily.         Marland Kitchen GLIMEPIRIDE 4 MG PO TABS   Oral   Take 4 mg by mouth 2 (two) times daily.         . INTERFERON BETA-1A 44 MCG/0.5ML [place] SOLN   Subcutaneous   Inject 44 mcg into the skin 3 (three) times a  week. Monday, Wednesday, and Friday         . MELATONIN 3 MG PO TABS   Oral   Take 3 mg by mouth at bedtime as needed. For sleep         . METFORMIN HCL ER 500 MG PO TB24   Oral   Take 1,000 mg by mouth 2 (two) times daily.         Marland Kitchen NAPROXEN SODIUM 220 MG PO TABS   Oral   Take 220 mg by mouth 2 (two) times daily with a meal.         . PRAVASTATIN SODIUM 40 MG PO TABS   Oral   Take 40 mg by mouth daily.         Marland Kitchen VALSARTAN 320 MG PO TABS   Oral   Take 320 mg by mouth daily.           BP 156/99  Pulse 93  Temp 98.1 F (36.7 C) (Oral)  Resp 18  SpO2 98%  Physical Exam CONSTITUTIONAL: Well developed/well nourished HEAD AND FACE: Normocephalic/atraumatic EYES: EOMI ENMT: Mucous membranes moist NECK: supple no meningeal signs CV: S1/S2 noted, no murmurs/rubs/gallops noted LUNGS: wheezing noted bilaterally, cough noted, she is able to speak to me clearly ABDOMEN: soft, nontender, no rebound or guarding NEURO: Pt is awake/alert. No focal weakness in her UE.  She can wiggle both feet and can lift each leg off of the bed on her own but is limited  and will place back down on bed.   EXTREMITIES: pulses normal in bilateral LE SKIN: warm, color normal PSYCH: no abnormalities of mood noted  ED Course  Procedures    Labs Reviewed  BASIC METABOLIC PANEL  CBC WITH DIFFERENTIAL  URINALYSIS, ROUTINE W REFLEX MICROSCOPIC  URINE CULTURE   11:54 AM Pt sent by her neurologist Reviewed notes - he feels she should be admitted for possible bronchitis and potential MS flare.  He recommends IV steroids and MRI of spine.   Pt with wheezing/cough, nebs/steroids ordered as well as CXR.  She has some neuro function in her legs and given this has been progressive over past week will defer emergent MRI but will need this in hospital.    2:07 PM D/w dr Gwenlyn Perking, [facility], will evaluate patient for likely admission    MDM  Nursing notes including past medical history and social history reviewed and considered in documentation xrays reviewed and considered Labs/vital reviewed and considered         Joya Gaskins, MD 07/21/12 1407

## 2012-07-21 NOTE — H&P (Signed)
[facility] History and Physical  Laura Roberts ZOX:[id number] DOB: 11/14/1940 DOA: 07/21/2012  Referring physician: Dr. Bebe Shaggy  PCP: No primary provider on file.  Specialists: Dr. Sandria Manly (neurologist)  Chief Complaint: Shortness of breath, cough (productive), increased weakness/paraplegia  HPI: Laura Roberts is a 72 y.o. female past medical history significant for diabetes, hypertension, multiple sclerosis and hyperlipidemia; came to the hospital secondary to increase shortness of breath, cough and weakness over the last week prior to admission. Patient reports upper respiratory infection symptoms for the last week, worsening in nature, with associated chills but no fever, greenish expectoration and also significant weakness affecting her lower extremities. Prior to admission patient has been seen by her neurologist Dr. Sandria Manly who at this particular moment believed that the patient is experiencing acute flare of her multiple sclerosis secondary to upper respiratory infection; patient was advised to come to the emergency department for further evaluation and treatment. In the ED patient was found with significant weakness in her lower extremities preventing for her to ambulate safely; she have that x-ray without acute consolidate or infiltrates, mild leukocyte elevation; but with significant difficulty breathing and wheezing. Patient received nebulizer treatment, one dose of solumedrol and place on supplemental oxygen. [facility] called to admit patient for further evaluation and treatment.  Of note patient denies chest pain, abdominal pain, nausea, vomiting, and diarrhea, melena, hemoptysis, hematemesis or hematochezia.  Review of Systems:  Negative except as mentioned on history of present illness.  Past Medical History  Diagnosis Date  . Hypertension   . Multiple sclerosis    Surgical history: Significant for bilateral knee replacement.  Social History:  reports that she has never smoked.  She does not have any smokeless tobacco history on file. She reports that she does not drink alcohol or use illicit drugs. patient live at home by herself, she normally use a brace and also a walker to assist with ambulation but prior to this event was able to perform all her activities of daily living.  Allergies  Allergen Reactions  . Codeine Other (See Comments)    Difficult breathing and skin problem  . Ultram (Tramadol) Other (See Comments)    Difficult breathing and skin peeling    Family history: Per review with patient only pertinent for hypertension.  Prior to Admission medications   Medication Sig Start Date End Date Taking? Authorizing Provider  amLODipine (NORVASC) 5 MG tablet Take 5 mg by mouth daily.   Yes Historical Provider, MD  atenolol (TENORMIN) 50 MG tablet Take 50 mg by mouth daily.   Yes Historical Provider, MD  calcium carbonate (TUMS - DOSED IN MG ELEMENTAL CALCIUM) 500 MG chewable tablet Chew 1 tablet by mouth daily as needed. For heartburn   Yes Historical Provider, MD  Calcium Carbonate-Vitamin D (CALCIUM + D PO) Take 0.5 tablets by mouth 2 (two) times daily.   Yes Historical Provider, MD  Canagliflozin (INVOKANA) 100 MG TABS Take 100 mg by mouth daily.   Yes Historical Provider, MD  DULoxetine (CYMBALTA) 60 MG capsule Take 60 mg by mouth daily.   Yes Historical Provider, MD  gabapentin (NEURONTIN) 300 MG capsule Take 300 mg by mouth 4 (four) times daily.   Yes Historical Provider, MD  glimepiride (AMARYL) 4 MG tablet Take 4 mg by mouth 2 (two) times daily.   Yes Historical Provider, MD  interferon beta-1a (REBIF) 44 MCG/0.5ML injection Inject 44 mcg into the skin 3 (three) times a week. Monday, Wednesday, and Friday   Yes Historical  Provider, MD  Melatonin 3 MG TABS Take 3 mg by mouth at bedtime as needed. For sleep   Yes Historical Provider, MD  metFORMIN (GLUCOPHAGE-XR) 500 MG 24 hr tablet Take 1,000 mg by mouth 2 (two) times daily.   Yes Historical Provider, MD    naproxen sodium (ANAPROX) 220 MG tablet Take 220 mg by mouth 2 (two) times daily with a meal.   Yes Historical Provider, MD  pravastatin (PRAVACHOL) 40 MG tablet Take 40 mg by mouth daily.   Yes Historical Provider, MD  valsartan (DIOVAN) 320 MG tablet Take 320 mg by mouth daily.   Yes Historical Provider, MD   Physical Exam: Filed Vitals:   07/21/12 0955 07/21/12 1232 07/21/12 1400 07/21/12 1530  BP: 156/99 132/58 117/66 106/69  Pulse: 93 104 105 112  Temp: 98.1 F (36.7 C)     TempSrc: Oral     Resp: 18 18    SpO2: 98% 100% 100% 98%     General:  Afebrile, mild shortness of breath when trying to speak in full sentences; no acute distress  Eyes: PERRLA, no nystagmus, no icterus, extraocular muscles intact.  ENT: No erythema, no exudates, good dentition, moist mucous membranes, no drainage out of her ears or nostrils.  Neck: Supple, no thyromegaly.  Cardiovascular: Tachycardia, no rubs, no gallops, no murmurs, S1 and S2 appreciated on exam.  Respiratory: Expiratory wheezing, diffuse rhonchi, decreased air movement, no crackles.  Abdomen: Obese, soft, nontender, positive bowel sounds, nondistended  Skin: No rash, no petechiae.  Musculoskeletal: scars from previous knee surgeries, but no acute joint swelling or erythema on exam.  Psychiatric: Stable, no hallucinations, no suicidal ideation.  Neurologic: Decrease in strength of her lower extremities  Labs on Admission:  Basic Metabolic Panel:  Lab 07/21/12 1610  NA 140  K 4.4  CL 100  CO2 26  GLUCOSE 147*  BUN 16  CREATININE 0.76  CALCIUM 9.7  MG --  PHOS --   CBC:  Lab 07/21/12 1132  WBC 10.7*  NEUTROABS 6.5  HGB 13.0  HCT 40.1  MCV 88.3  PLT 277   Radiological Exams on Admission: Dg Chest Port 1 View  07/21/2012  *RADIOLOGY REPORT*  Clinical Data: Cough and short of breath  PORTABLE CHEST - 1 VIEW  Comparison: 11/28/2005  Findings: Prominent  heart size without heart failure.  Lungs are clear without  infiltrate or effusion.  IMPRESSION: No acute abnormality.   Original Report Authenticated By: Janeece Riggers, M.D.     EKG: Sinus tachycardia, normal axis, no PR prolongation. No acute ischemic changes.  Assessment/Plan 1-SOB (shortness of breath): With symptoms present for more than one week at this point; no history of fever. Main differential diagnoses will include purulent bronchitis with early pneumonia. Patient reports having some greenish expectoration. -Will admit to the hospital for further evaluation and treatment. -Levaquin to be started IV for good coverage of community-acquired pneumonia and also purelent bronchitis. -Duo-nebs and incentive spirometer -PRN supplemental oxygen  2-Multiple sclerosis: Long-standing history of multiple sclerosis followed by Dr. Sandria Manly as an outpatient.  -Will continue home medications and will add IV Solu Medrol 500 mg daily x3 doses with a subsequent prednisone tapering for multiple sclerosis exacerbation.  -Patient will also have an MRI of her lower back, for thorough examination of her LE weakness and lumbar pain. -Neurology has been consulted further recommendations.  3-Weakness: Most likely secondary to multiple sclerosis flare do to upper respiratory infection. -Will treat as mentioned above with IV Solu-Medrol. -  Treat underlying precipitating infection. -Physical therapy evaluation and treatment.  4-Purulent Bronchitis/early pneumonia: Will treat with antibiotics and supportive care. Plan of care as mentioned in problem #1.  5-HTN (hypertension): Continue home medications. Will monitor vital signs. Overall stable  6-DM (diabetes mellitus): Will check hemoglobin A1c and use a sliding scale insulin. Will also continue her Amaryl. Metformin on hold during hospitalization. Due to high dose steroids she might require non-acting baseline insulin; will follow her CBG and adjust/start as needed.  7-HLD (hyperlipidemia): Continue  statins.  8-Tachycardia: Most likely secondary to albuterol use. Will follow patient on telemetry to assure no further abnormalities are appreciated. EKG done in the emergency department do not demonstrate any acute ischemic changes. Patient denies chest pain. Will check TSH.  9-GERD (gastroesophageal reflux disease): Continue PPI.  DVT: Lovenox   Code Status: DNR Family Communication: significant other inside the room at bedside Patsy [place] [phone]) Disposition Plan:   Time spent: >30 minutes  Neuro-Hospitalist has been consulted (Dr. Wyatt Portela)  [facility] Pager [phone]  If 7PM-7AM, please contact night-coverage [email or web address] Password [facility] 07/21/2012, 4:11 PM

## 2012-07-21 NOTE — ED Notes (Signed)
Put patient on the bed pan  

## 2012-07-22 ENCOUNTER — Inpatient Hospital Stay (HOSPITAL_COMMUNITY): Payer: Medicare Other

## 2012-07-22 ENCOUNTER — Encounter (HOSPITAL_COMMUNITY): Payer: Self-pay | Admitting: *Deleted

## 2012-07-22 DIAGNOSIS — G35 Multiple sclerosis: Secondary | ICD-10-CM

## 2012-07-22 LAB — BASIC METABOLIC PANEL
BUN: 19 mg/dL (ref 6–23)
CO2: 23 mEq/L (ref 19–32)
Calcium: 9.8 mg/dL (ref 8.4–10.5)
Creatinine, Ser: 0.74 mg/dL (ref 0.50–1.10)
GFR calc non Af Amer: 85 mL/min — ABNORMAL LOW (ref >90)
Glucose, Bld: 282 mg/dL — ABNORMAL HIGH (ref 70–99)
Sodium: 140 mEq/L (ref 135–145)

## 2012-07-22 LAB — URINE CULTURE

## 2012-07-22 LAB — GLUCOSE, CAPILLARY: Glucose-Capillary: 209 mg/dL — ABNORMAL HIGH (ref 70–99)

## 2012-07-22 LAB — CBC
MCH: 28.4 pg (ref 26.0–34.0)
MCHC: 32.5 g/dL (ref 30.0–36.0)
MCV: 87.2 fL (ref 78.0–100.0)
Platelets: 291 10*3/uL (ref 150–400)
RDW: 14.8 % (ref 11.5–15.5)

## 2012-07-22 LAB — TSH: TSH: 0.102 u[IU]/mL — ABNORMAL LOW (ref 0.350–4.500)

## 2012-07-22 LAB — D-DIMER, QUANTITATIVE: D-Dimer, Quant: 0.33 ug/mL-FEU (ref 0.00–0.48)

## 2012-07-22 MED ORDER — LORAZEPAM 2 MG/ML IJ SOLN
1.0000 mg | Freq: Once | INTRAMUSCULAR | Status: AC
  Administered 2012-07-22: 1 mg via INTRAVENOUS
  Filled 2012-07-22: qty 1

## 2012-07-22 MED ORDER — INSULIN GLARGINE 100 UNIT/ML ~~LOC~~ SOLN
15.0000 [IU] | Freq: Every day | SUBCUTANEOUS | Status: DC
  Administered 2012-07-22: 15 [IU] via SUBCUTANEOUS

## 2012-07-22 MED ORDER — ALBUTEROL SULFATE (5 MG/ML) 0.5% IN NEBU
2.5000 mg | INHALATION_SOLUTION | Freq: Two times a day (BID) | RESPIRATORY_TRACT | Status: DC
  Administered 2012-07-22 – 2012-07-23 (×2): 2.5 mg via RESPIRATORY_TRACT
  Filled 2012-07-22 (×3): qty 0.5

## 2012-07-22 MED ORDER — ALBUTEROL SULFATE (5 MG/ML) 0.5% IN NEBU: 2.5000 mg | INHALATION_SOLUTION | Freq: Four times a day (QID) | RESPIRATORY_TRACT | Status: DC | PRN

## 2012-07-22 MED ORDER — IPRATROPIUM BROMIDE 0.02 % IN SOLN
0.5000 mg | Freq: Two times a day (BID) | RESPIRATORY_TRACT | Status: DC
  Administered 2012-07-22 – 2012-07-23 (×2): 0.5 mg via RESPIRATORY_TRACT
  Filled 2012-07-22 (×3): qty 2.5

## 2012-07-22 NOTE — Consult Note (Signed)
Physical Medicine and Rehabilitation Consult Reason for Consult: LE weakness, SOB, ?MS flare Referring Physician:  Dr. Susie Cassette   HPI: Laura Roberts is a 72 y.o. female with history of HTN, MS, SOB with URI symptoms for the past week with significant weakness  LE with inability to walk. She was evaluated by neurology and sent to ED on 07/21/12 for treatment of MS exacerbation.  She was evaluated by Dr. Leroy Kennedy and started on IV solumedrol due to question of MS flare up.  Purulent bronchitis with  Question of early PNA treated with antibiotic and supportive care. MRI spine with new spinal stenosis C4/5 with two small probably demyelinating plaques at C2/3 and C7-stable, severe multifactorial thoracic spinal stenosis with increased cord compression T 11-12 , diffuse lumbar spinal stenosis severe L3-S1 and moderate to severe distension of the bladder.  Respiratory status has improved. PT evaluation done and patient limited by significant weakness. MD, PT, CM recommending CIR.   Pt states that her weakness is mainly in the legs  Review of Systems  Constitutional: Positive for malaise/fatigue.  Eyes: Negative for blurred vision and double vision.  Respiratory: Positive for cough (better) and shortness of breath.   Cardiovascular: Positive for chest pain (due to  coughing. ).  Gastrointestinal: Negative for abdominal pain.  Genitourinary: Positive for urgency and frequency.       Neurogenic bladder.   Musculoskeletal: Positive for myalgias and back pain.  Neurological: Positive for speech change, focal weakness (RLE>LLE weakness with chronic foot drop. ) and weakness.   Past Medical History  Diagnosis Date  . Hypertension   . Multiple sclerosis   . Diabetes mellitus without complication    Past Surgical History  Procedure Date  . Replacement total knee bilateral 2004  . Abdominal hysterectomy   . Cholecystectomy   . Back surgery   . Left hand carpal tunnel surgery    Family History    Problem Relation Age of Onset  . Multiple sclerosis Other     neices.     Social History: Lives alone. Independent with walker PTA--has B-AFO's for foot drop. Does housework/cooking/cleaning.  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 Has supportive friends. She  reports that she has never smoked. She does not have any smokeless tobacco history on file. She reports that she does not drink alcohol or use illicit drugs.   Allergies  Allergen Reactions  . Codeine Other (See Comments)    Difficult breathing and skin problem  . Ultram (Tramadol) Other (See Comments)    Difficult breathing and skin peeling   Medications Prior to Admission  Medication Sig Dispense Refill  . amLODipine (NORVASC) 5 MG tablet Take 5 mg by mouth daily.      Marland Kitchen atenolol (TENORMIN) 50 MG tablet Take 50 mg by mouth daily.      . calcium carbonate (TUMS - DOSED IN MG ELEMENTAL CALCIUM) 500 MG chewable tablet Chew 1 tablet by mouth daily as needed. For heartburn      . Calcium Carbonate-Vitamin D (CALCIUM + D PO) Take 0.5 tablets by mouth 2 (two) times daily.      . Canagliflozin (INVOKANA) 100 MG TABS Take 100 mg by mouth daily.      . DULoxetine (CYMBALTA) 60 MG capsule Take 60 mg by mouth daily.      Marland Kitchen gabapentin (NEURONTIN) 300 MG capsule Take 300 mg by mouth 4 (four) times daily.      Marland Kitchen  glimepiride (AMARYL) 4 MG tablet Take 4 mg by mouth 2 (two) times daily.      . interferon beta-1a (REBIF) 44 MCG/0.5ML injection Inject 44 mcg into the skin 3 (three) times a week. Monday, Wednesday, and Friday      . Melatonin 3 MG TABS Take 3 mg by mouth at bedtime as needed. For sleep      . metFORMIN (GLUCOPHAGE-XR) 500 MG 24 hr tablet Take 1,000 mg by mouth 2 (two) times daily.      . naproxen sodium (ANAPROX) 220 MG tablet Take 220 mg by mouth 2 (two) times daily with a meal.      . pravastatin (PRAVACHOL) 40 MG tablet Take 40 mg by mouth daily.      . valsartan (DIOVAN)  320 MG tablet Take 320 mg by mouth daily.        Home: Home Living Lives With: Alone Available Help at Discharge: Friend(s);Available PRN/intermittently Type of Home: Other (Comment) (unsure, house or apt) Home Access: Level entry Home Layout: One level Bathroom Shower/Tub: Tub/shower unit Bathroom Accessibility: Yes How Accessible: Accessible via walker Home Adaptive Equipment: Bedside commode/3-in-1;Hand-held shower hose;Walker - rolling;Tub transfer bench Additional Comments: Has been managing independently since rehab stint in 2007  Functional History:   Functional Status:  Mobility: Bed Mobility Bed Mobility: Rolling Left;Left Sidelying to Sit;Sitting - Scoot to [place] of Bed Rolling Left: 4: Min assist;With rail Left Sidelying to Sit: 3: Mod assist;With rails Sitting - Scoot to [place] of Bed: 4: Min assist;With rail Transfers Transfers: Sit to Stand;Stand to Sit Sit to Stand: 1: +2 Total assist;From bed Sit to Stand: Patient Percentage: 60% Stand to Sit: 1: +2 Total assist;To bed Stand to Sit: Patient Percentage: 60%      ADL:    Cognition: Cognition Arousal/Alertness: Awake/alert Orientation Level: Oriented X4 Cognition Overall Cognitive Status: Appears within functional limits for tasks assessed/performed Arousal/Alertness: Awake/alert Orientation Level: Oriented X4 / Intact Behavior During Session: [facility] for tasks performed  Blood pressure 109/78, pulse 102, temperature 97.7 F (36.5 C), temperature source Oral, resp. rate 18, height 5\' 2"  (1.575 m), weight 92.2 kg (203 lb 4.2 oz), SpO2 94.00%. Physical Exam  Nursing note and vitals reviewed. Constitutional: She is oriented to person, place, and time. She appears well-developed and well-nourished.  HENT:  Head: Normocephalic and atraumatic.  Eyes: Pupils are equal, round, and reactive to light.  Neck: Normal range of motion.  Cardiovascular: Regular rhythm.  Tachycardia present.   Pulmonary/Chest: Effort  normal and breath sounds normal.  Abdominal: Soft. Bowel sounds are normal.  Neurological: She is alert and oriented to person, place, and time.       Follows commands without difficulty. Dysphonia noted (due to coughing). BLE weakness with foot drop.   Skin: Skin is warm and dry.  Motor strength is 4/5 in bilateral deltoid, biceps, triceps, grip 2 minus in the hip flexors 3 minus knee extensors 1/5 in the right ankle dorsiflexor plantar flexor and 2/5 in the left ankle dorsiflexor plantar flexor Sensation intact to light touch and proprioception in both lower extremities  Results for orders placed during the hospital encounter of 07/21/12 (from the past 24 hour(s))  GLUCOSE, CAPILLARY     Status: Abnormal   Collection Time   07/21/12  9:20 PM      Component Value Range   Glucose-Capillary 326 (*) 70 - 99 mg/dL  CBC     Status: Abnormal   Collection Time   07/21/12 10:36 PM  Component Value Range   WBC 13.7 (*) 4.0 - 10.5 K/uL   RBC 4.40  3.87 - 5.11 MIL/uL   Hemoglobin 12.6  12.0 - 15.0 g/dL   HCT 16.1  09.6 - 04.5 %   MCV 87.3  78.0 - 100.0 fL   MCH 28.6  26.0 - 34.0 pg   MCHC 32.8  30.0 - 36.0 g/dL   RDW 40.9  81.1 - 91.4 %   Platelets 280  150 - 400 K/uL  CREATININE, SERUM     Status: Abnormal   Collection Time   07/21/12 10:36 PM      Component Value Range   Creatinine, Ser 0.91  0.50 - 1.10 mg/dL   GFR calc non Af Amer 63 (*) >90 mL/min   GFR calc Af Amer 73 (*) >90 mL/min  MAGNESIUM     Status: Normal   Collection Time   07/21/12 10:36 PM      Component Value Range   Magnesium 1.8  1.5 - 2.5 mg/dL  PHOSPHORUS     Status: Normal   Collection Time   07/21/12 10:36 PM      Component Value Range   Phosphorus 3.0  2.3 - 4.6 mg/dL  TSH     Status: Abnormal   Collection Time   07/21/12 10:36 PM      Component Value Range   TSH 0.102 (*) 0.350 - 4.500 uIU/mL  HEMOGLOBIN A1C     Status: Abnormal   Collection Time   07/21/12 10:36 PM      Component Value Range    Hemoglobin A1C 7.6 (*) <5.7 %   Mean Plasma Glucose 171 (*) <117 mg/dL  BASIC METABOLIC PANEL     Status: Abnormal   Collection Time   07/22/12  4:35 AM      Component Value Range   Sodium 140  135 - 145 mEq/L   Potassium 4.5  3.5 - 5.1 mEq/L   Chloride 102  96 - 112 mEq/L   CO2 23  19 - 32 mEq/L   Glucose, Bld 282 (*) 70 - 99 mg/dL   BUN 19  6 - 23 mg/dL   Creatinine, Ser 7.82  0.50 - 1.10 mg/dL   Calcium 9.8  8.4 - 95.6 mg/dL   GFR calc non Af Amer 85 (*) >90 mL/min   GFR calc Af Amer >90  >90 mL/min  CBC     Status: Abnormal   Collection Time   07/22/12  4:35 AM      Component Value Range   WBC 15.4 (*) 4.0 - 10.5 K/uL   RBC 4.37  3.87 - 5.11 MIL/uL   Hemoglobin 12.4  12.0 - 15.0 g/dL   HCT 21.3  08.6 - 57.8 %   MCV 87.2  78.0 - 100.0 fL   MCH 28.4  26.0 - 34.0 pg   MCHC 32.5  30.0 - 36.0 g/dL   RDW 46.9  62.9 - 52.8 %   Platelets 291  150 - 400 K/uL  GLUCOSE, CAPILLARY     Status: Abnormal   Collection Time   07/22/12  7:37 AM      Component Value Range   Glucose-Capillary 284 (*) 70 - 99 mg/dL   Comment 1 Documented in Chart     Comment 2 Notify RN    GLUCOSE, CAPILLARY     Status: Abnormal   Collection Time   07/22/12 12:38 PM      Component Value Range   Glucose-Capillary 253 (*)  70 - 99 mg/dL   Comment 1 Documented in Chart     Comment 2 Notify RN     Mr Cervical Spine Wo Contrast  07/22/2012  *RADIOLOGY REPORT*  Clinical Data:  72 year old female with history of multiple sclerosis.  Recent shortness of breath and increased weakness. Possible MS FLAIR. Prior spine surgery.  New onset paraplegia and lumbar pain.  Comparison: [facility] lumbar radiographs 01/07/2011.  Thoracic and lumbar MRI 01/03/2011. Cervical MRI 10/19/2005.  MRI CERVICAL SPINE WITHOUT CONTRAST  Technique:  Multiplanar and multiecho pulse sequences of the cervic al spine, to include the craniocervical junction and cervicothoraci c junction, were obtained according to  standard protocol without intravenous contrast.  Findings:  Chronic thyromegaly, especially the left lobe.  Suspect multiple small thyroid nodules.  Benign multinodular goiter is favored.  Other Visualized paraspinal soft tissues are within normal limits.  Chronic straightening of cervical lordosis.  Mild anterolisthesis of C4 on C5 is increased along with posterior element degeneration at that level, see below. No marrow edema or evidence of acute osseous abnormality.  Cervicomedullary junction is within normal limits.  Visualized brain stem and cerebellum within normal limits.  Evidence of a small probable demyelinating plaque in the dorsal upper cervical cord at C2-C3 (series 24 image 8, series 26 image 5) was not evident on the 2007 study. Tiny chronic left dorsal cord lesion at C7 (series 26 image 19) appears to be stable.  No other definite spinal cord signal abnormality.  C2-C3:  Facet and ligament flavum hypertrophy is increased.  No spinal stenosis.  Mild bilateral C3 foraminal stenosis has mildly increased.  C3-C4:  Progressive disc space loss and endplate degeneration. Less pronounced central disc protrusion.  No significant spinal stenosis.  Moderate bilateral C4 foraminal stenosis has increased.  C4-C5:  No spondylolisthesis.  Progressive disc and endplate degeneration.  New moderate to severe ligament flavum hypertrophy. Chronic moderate facet hypertrophy not significantly changed.  New spinal stenosis with minimal to mild spinal cord flattening. Severe right and moderate left C5 foraminal stenosis has progressed.  C5-C6:  Chronic disc and endplate degeneration has not significantly changed.  Moderate facet hypertrophy has not significantly changed.  Chronic borderline to mild spinal stenosis is stable.  No spinal cord mass effect.  Severe left and moderate to severe right C6 foraminal stenosis at mildly progressed.  C6-C7:  Chronic disc and endplate degeneration.  Stable broad-based central disc  component.  Mild to moderate ligament flavum hypertrophy is not significantly changed.  Mild spinal stenosis with minimal to mild spinal cord mass effect has not significantly changed.  Severe left and moderate right C7 foraminal stenosis has not significantly changed.  C7-T1:  Mild anterolisthesis is stable.  Bulky circumferential disc osteophyte complex and facet hypertrophy has not significantly changed.  Stable mild spinal stenosis.  Stable severe bilateral C8 foraminal stenosis.  Thoracic spine findings are below.  IMPRESSION: 1.  Multilevel multifactorial cervical spine degeneration has progressed since 2007.  New spinal stenosis at C4-C5.  Mild intermittent cervical spinal cord mass effect with no compression related cervical spinal cord signal abnormality. 2.  Two small probable demyelinating plaques in the cervical spinal cord dorsally at C2-C3 (not evident in 2007) and dorsally at C7 (stable). 3.  See thoracic and lumbar findings below. 4.  Chronic multinodular goiter.  MRI THORACIC SPINE WITHOUT CONTRAST  Technique: Multiplanar and multiecho pulse sequences of the thoracic spine were obtained without intravenous contrast.  Findings:  Upper thoracic vertebral height  and alignment is stable. No upper thoracic marrow edema.  Remote posterior decompression at T11-T12 and T12-L1. Stable posterior paraspinal soft tissues.  Stable visualized thoracic and upper abdominal viscera.  Evidence of predominately dorsal spinal cord signal abnormality at T4-T5 (series 11 image 8), and centered at T6 (image 7).  The former lesion appears to be new or increased.  The latter lesion appears to be chronic but increased.  Anterolisthesis of T11 on T12 is chronic but appears increased since 2012 and is associated with worsening multifactorial spinal stenosis at that level, see below.  There is evidence of associated mild spinal cord signal abnormality (series 9 image 7). Furthermore, most apparent on axial T2-weighted images  there is chronic central cord signal abnormality occurring below the T7 level and extending to the stenotic T11-T12 level.  This is not significantly changed.  T1-T2: Stable mild facet hypertrophy.  T2-T3: Negative.  T3-T4: Severe facet degeneration on the right has progressed.  No significant spinal stenosis.  Right T3 foraminal stenosis is mildly progressed.  T4-T5: Facet degeneration on the right has progressed.  Right T4 foraminal stenosis has not significantly changed.  T5-T6: Facet hypertrophy greater on the right is not significantly changed.  T6-T7: Mild circumferential disc bulges stable.  Moderate facet and ligament flavum hypertrophy is stable.  Stable foraminal stenosis with no significant spinal stenosis.  T7-T8: Mild to moderate circumferential disc bulge appears mildly increased.  Moderate facet hypertrophy is increased.  Mild spinal stenosis is stable or mildly increased.  No significant spinal cord mass effect.  Bilateral foraminal stenosis is not significantly changed.  T8-T9: Circumferential disc bulge is stable.  Moderate facet hypertrophy is mildly increased.  No significant spinal stenosis.  T9-T10: Chronic circumferential disc bulges stable to mildly progressed.  Moderate facet hypertrophy is mildly progressed.  Mild spinal stenosis.  No significant spinal cord mass effect.  T10-T11: Moderate to severe facet hypertrophy is mildly progressed. Mild spinal stenosis has mildly increased.  T11-T12: Anterolisthesis appears increased along with disc osteophyte complex and residual posterior element hypertrophy. Spinal stenosis now is severe (series 12 image 36) with associated cord compression.  Bilateral foraminal stenosis is moderate to severe and only mildly increased.  T12-L1:  Stable with no significant spinal or foraminal stenosis. No definite spinal cord signal abnormality at this level.  The conus medullaris occurs at L1-L2, see below.  IMPRESSION: 1.  Progression of anterolisthesis at  T11-T12 following remote posterior decompression of this level.  Multifactorial spinal stenosis here has increased and is now severe (series 12 image 36). There is associated increased cord compression.  There is mild cord signal abnormality at this level, unclear if this is stable or increased.  See #2. 2.  Chronic central cord signal abnormality from T7-T8 to the stenotic T11-T12 level appears not significantly changed. Superimposed probable demyelinating plaques at T4-T5 and centered at T6 are chronic but increased since 2012. 3.  Mild progression of mostly posterior element degeneration in the upper thoracic spine.  Other levels of mild thoracic spinal stenosis have not significantly changed. 4.  See lumbar findings below.  MRI LUMBAR SPINE WITHOUT CONTRAST  Technique: Multiplanar and multiecho pulse sequences of the lumbar spine were obtained without intravenous contrast.  Findings:  Stable visualized abdominal viscera.  There is moderate to severe bladder distention which is new.  Visualized paraspinal soft tissues are within normal limits.  Lumbar vertebral height and alignment has not significantly changed. No marrow edema or evidence of acute osseous abnormality.  L1-L2:  Chronic spinal stenosis with indistinctness of the conus medullaris at this level, favor related to chronic redundancy of the spinal nerve roots occupying the ventral CSF space.  The appearance is not significantly changed.  Disc bulge, facet and ligament flavum hypertrophy here is stable.  L2-L3:  Chronic spinal stenosis related to right eccentric circumferential disc bulge and moderate facet and ligament flavum hypertrophy is stable and moderate.  L3-L4:  Chronic spinal stenosis related to bulky circumferential disc osteophyte complex, severe facet hypertrophy, and severe ligament flavum hypertrophy, is severe and stable to mildly progressed.  L4-L5:  Chronic spinal stenosis related to bulky right eccentric circumferential disc  osteophyte complex and severe facet and severe ligament flavum hypertrophy is severe and not significantly changed.  Trace fluid in the facet joints greater on the left is stable.  Moderate to severe right L4 foraminal stenosis is stable.  L5-S1:  Chronic spinal stenosis at this level appears progressed and is related to mild right eccentric circumferential disc bulge plus severe facet and ligament flavum hypertrophy.  Spinal stenosis now is moderate to severe, previously mild to moderate.  Lateral recess stenosis now is moderate.  Mild left and moderate to severe right L5 foraminal stenosis has not significantly changed.  IMPRESSION: 1.  Diffuse chronic lumbar spinal stenosis has mildly progressed at L3-L4 and L5-S1 as detailed above.  Spinal stenosis is severe at L3- L4, L4-L5, and L5-S1. 2.  Moderate to severe distention of the bladder.  3. Chronic indistinctness of the conus medullaris felt related to compressed redundant nerve roots in the setting of diffuse lumbar spinal stenosis. 4.  No acute osseous abnormality in the lumbar spine.   Original Report Authenticated By: Erskine Speed, M.D.    Mr Thoracic Spine Wo Contrast  07/22/2012  *RADIOLOGY REPORT*  Clinical Data:  72 year old female with history of multiple sclerosis.  Recent shortness of breath and increased weakness. Possible MS FLAIR. Prior spine surgery.  New onset paraplegia and lumbar pain.  Comparison: [facility] lumbar radiographs 01/07/2011.  Thoracic and lumbar MRI 01/03/2011. Cervical MRI 10/19/2005.  MRI CERVICAL SPINE WITHOUT CONTRAST  Technique:  Multiplanar and multiecho pulse sequences of the cervic al spine, to include the craniocervical junction and cervicothoraci c junction, were obtained according to standard protocol without intravenous contrast.  Findings:  Chronic thyromegaly, especially the left lobe.  Suspect multiple small thyroid nodules.  Benign multinodular goiter is favored.  Other Visualized  paraspinal soft tissues are within normal limits.  Chronic straightening of cervical lordosis.  Mild anterolisthesis of C4 on C5 is increased along with posterior element degeneration at that level, see below. No marrow edema or evidence of acute osseous abnormality.  Cervicomedullary junction is within normal limits.  Visualized brain stem and cerebellum within normal limits.  Evidence of a small probable demyelinating plaque in the dorsal upper cervical cord at C2-C3 (series 24 image 8, series 26 image 5) was not evident on the 2007 study. Tiny chronic left dorsal cord lesion at C7 (series 26 image 19) appears to be stable.  No other definite spinal cord signal abnormality.  C2-C3:  Facet and ligament flavum hypertrophy is increased.  No spinal stenosis.  Mild bilateral C3 foraminal stenosis has mildly increased.  C3-C4:  Progressive disc space loss and endplate degeneration. Less pronounced central disc protrusion.  No significant spinal stenosis.  Moderate bilateral C4 foraminal stenosis has increased.  C4-C5:  No spondylolisthesis.  Progressive disc and endplate degeneration.  New moderate to severe ligament  flavum hypertrophy. Chronic moderate facet hypertrophy not significantly changed.  New spinal stenosis with minimal to mild spinal cord flattening. Severe right and moderate left C5 foraminal stenosis has progressed.  C5-C6:  Chronic disc and endplate degeneration has not significantly changed.  Moderate facet hypertrophy has not significantly changed.  Chronic borderline to mild spinal stenosis is stable.  No spinal cord mass effect.  Severe left and moderate to severe right C6 foraminal stenosis at mildly progressed.  C6-C7:  Chronic disc and endplate degeneration.  Stable broad-based central disc component.  Mild to moderate ligament flavum hypertrophy is not significantly changed.  Mild spinal stenosis with minimal to mild spinal cord mass effect has not significantly changed.  Severe left and moderate  right C7 foraminal stenosis has not significantly changed.  C7-T1:  Mild anterolisthesis is stable.  Bulky circumferential disc osteophyte complex and facet hypertrophy has not significantly changed.  Stable mild spinal stenosis.  Stable severe bilateral C8 foraminal stenosis.  Thoracic spine findings are below.  IMPRESSION: 1.  Multilevel multifactorial cervical spine degeneration has progressed since 2007.  New spinal stenosis at C4-C5.  Mild intermittent cervical spinal cord mass effect with no compression related cervical spinal cord signal abnormality. 2.  Two small probable demyelinating plaques in the cervical spinal cord dorsally at C2-C3 (not evident in 2007) and dorsally at C7 (stable). 3.  See thoracic and lumbar findings below. 4.  Chronic multinodular goiter.  MRI THORACIC SPINE WITHOUT CONTRAST  Technique: Multiplanar and multiecho pulse sequences of the thoracic spine were obtained without intravenous contrast.  Findings:  Upper thoracic vertebral height and alignment is stable. No upper thoracic marrow edema.  Remote posterior decompression at T11-T12 and T12-L1. Stable posterior paraspinal soft tissues.  Stable visualized thoracic and upper abdominal viscera.  Evidence of predominately dorsal spinal cord signal abnormality at T4-T5 (series 11 image 8), and centered at T6 (image 7).  The former lesion appears to be new or increased.  The latter lesion appears to be chronic but increased.  Anterolisthesis of T11 on T12 is chronic but appears increased since 2012 and is associated with worsening multifactorial spinal stenosis at that level, see below.  There is evidence of associated mild spinal cord signal abnormality (series 9 image 7). Furthermore, most apparent on axial T2-weighted images there is chronic central cord signal abnormality occurring below the T7 level and extending to the stenotic T11-T12 level.  This is not significantly changed.  T1-T2: Stable mild facet hypertrophy.  T2-T3:  Negative.  T3-T4: Severe facet degeneration on the right has progressed.  No significant spinal stenosis.  Right T3 foraminal stenosis is mildly progressed.  T4-T5: Facet degeneration on the right has progressed.  Right T4 foraminal stenosis has not significantly changed.  T5-T6: Facet hypertrophy greater on the right is not significantly changed.  T6-T7: Mild circumferential disc bulges stable.  Moderate facet and ligament flavum hypertrophy is stable.  Stable foraminal stenosis with no significant spinal stenosis.  T7-T8: Mild to moderate circumferential disc bulge appears mildly increased.  Moderate facet hypertrophy is increased.  Mild spinal stenosis is stable or mildly increased.  No significant spinal cord mass effect.  Bilateral foraminal stenosis is not significantly changed.  T8-T9: Circumferential disc bulge is stable.  Moderate facet hypertrophy is mildly increased.  No significant spinal stenosis.  T9-T10: Chronic circumferential disc bulges stable to mildly progressed.  Moderate facet hypertrophy is mildly progressed.  Mild spinal stenosis.  No significant spinal cord mass effect.  T10-T11: Moderate to severe facet  hypertrophy is mildly progressed. Mild spinal stenosis has mildly increased.  T11-T12: Anterolisthesis appears increased along with disc osteophyte complex and residual posterior element hypertrophy. Spinal stenosis now is severe (series 12 image 36) with associated cord compression.  Bilateral foraminal stenosis is moderate to severe and only mildly increased.  T12-L1:  Stable with no significant spinal or foraminal stenosis. No definite spinal cord signal abnormality at this level.  The conus medullaris occurs at L1-L2, see below.  IMPRESSION: 1.  Progression of anterolisthesis at T11-T12 following remote posterior decompression of this level.  Multifactorial spinal stenosis here has increased and is now severe (series 12 image 36). There is associated increased cord compression.  There is  mild cord signal abnormality at this level, unclear if this is stable or increased.  See #2. 2.  Chronic central cord signal abnormality from T7-T8 to the stenotic T11-T12 level appears not significantly changed. Superimposed probable demyelinating plaques at T4-T5 and centered at T6 are chronic but increased since 2012. 3.  Mild progression of mostly posterior element degeneration in the upper thoracic spine.  Other levels of mild thoracic spinal stenosis have not significantly changed. 4.  See lumbar findings below.  MRI LUMBAR SPINE WITHOUT CONTRAST  Technique: Multiplanar and multiecho pulse sequences of the lumbar spine were obtained without intravenous contrast.  Findings:  Stable visualized abdominal viscera.  There is moderate to severe bladder distention which is new.  Visualized paraspinal soft tissues are within normal limits.  Lumbar vertebral height and alignment has not significantly changed. No marrow edema or evidence of acute osseous abnormality.  L1-L2:  Chronic spinal stenosis with indistinctness of the conus medullaris at this level, favor related to chronic redundancy of the spinal nerve roots occupying the ventral CSF space.  The appearance is not significantly changed.  Disc bulge, facet and ligament flavum hypertrophy here is stable.  L2-L3:  Chronic spinal stenosis related to right eccentric circumferential disc bulge and moderate facet and ligament flavum hypertrophy is stable and moderate.  L3-L4:  Chronic spinal stenosis related to bulky circumferential disc osteophyte complex, severe facet hypertrophy, and severe ligament flavum hypertrophy, is severe and stable to mildly progressed.  L4-L5:  Chronic spinal stenosis related to bulky right eccentric circumferential disc osteophyte complex and severe facet and severe ligament flavum hypertrophy is severe and not significantly changed.  Trace fluid in the facet joints greater on the left is stable.  Moderate to severe right L4 foraminal  stenosis is stable.  L5-S1:  Chronic spinal stenosis at this level appears progressed and is related to mild right eccentric circumferential disc bulge plus severe facet and ligament flavum hypertrophy.  Spinal stenosis now is moderate to severe, previously mild to moderate.  Lateral recess stenosis now is moderate.  Mild left and moderate to severe right L5 foraminal stenosis has not significantly changed.  IMPRESSION: 1.  Diffuse chronic lumbar spinal stenosis has mildly progressed at L3-L4 and L5-S1 as detailed above.  Spinal stenosis is severe at L3- L4, L4-L5, and L5-S1. 2.  Moderate to severe distention of the bladder.  3. Chronic indistinctness of the conus medullaris felt related to compressed redundant nerve roots in the setting of diffuse lumbar spinal stenosis. 4.  No acute osseous abnormality in the lumbar spine.   Original Report Authenticated By: Erskine Speed, M.D.    Mr Lumbar Spine Wo Contrast  07/22/2012  *RADIOLOGY REPORT*  Clinical Data:  72 year old female with history of multiple sclerosis.  Recent shortness of breath and increased weakness.  Possible MS FLAIR. Prior spine surgery.  New onset paraplegia and lumbar pain.  Comparison: [facility] lumbar radiographs 01/07/2011.  Thoracic and lumbar MRI 01/03/2011. Cervical MRI 10/19/2005.  MRI CERVICAL SPINE WITHOUT CONTRAST  Technique:  Multiplanar and multiecho pulse sequences of the cervic al spine, to include the craniocervical junction and cervicothoraci c junction, were obtained according to standard protocol without intravenous contrast.  Findings:  Chronic thyromegaly, especially the left lobe.  Suspect multiple small thyroid nodules.  Benign multinodular goiter is favored.  Other Visualized paraspinal soft tissues are within normal limits.  Chronic straightening of cervical lordosis.  Mild anterolisthesis of C4 on C5 is increased along with posterior element degeneration at that level, see below. No marrow edema  or evidence of acute osseous abnormality.  Cervicomedullary junction is within normal limits.  Visualized brain stem and cerebellum within normal limits.  Evidence of a small probable demyelinating plaque in the dorsal upper cervical cord at C2-C3 (series 24 image 8, series 26 image 5) was not evident on the 2007 study. Tiny chronic left dorsal cord lesion at C7 (series 26 image 19) appears to be stable.  No other definite spinal cord signal abnormality.  C2-C3:  Facet and ligament flavum hypertrophy is increased.  No spinal stenosis.  Mild bilateral C3 foraminal stenosis has mildly increased.  C3-C4:  Progressive disc space loss and endplate degeneration. Less pronounced central disc protrusion.  No significant spinal stenosis.  Moderate bilateral C4 foraminal stenosis has increased.  C4-C5:  No spondylolisthesis.  Progressive disc and endplate degeneration.  New moderate to severe ligament flavum hypertrophy. Chronic moderate facet hypertrophy not significantly changed.  New spinal stenosis with minimal to mild spinal cord flattening. Severe right and moderate left C5 foraminal stenosis has progressed.  C5-C6:  Chronic disc and endplate degeneration has not significantly changed.  Moderate facet hypertrophy has not significantly changed.  Chronic borderline to mild spinal stenosis is stable.  No spinal cord mass effect.  Severe left and moderate to severe right C6 foraminal stenosis at mildly progressed.  C6-C7:  Chronic disc and endplate degeneration.  Stable broad-based central disc component.  Mild to moderate ligament flavum hypertrophy is not significantly changed.  Mild spinal stenosis with minimal to mild spinal cord mass effect has not significantly changed.  Severe left and moderate right C7 foraminal stenosis has not significantly changed.  C7-T1:  Mild anterolisthesis is stable.  Bulky circumferential disc osteophyte complex and facet hypertrophy has not significantly changed.  Stable mild spinal  stenosis.  Stable severe bilateral C8 foraminal stenosis.  Thoracic spine findings are below.  IMPRESSION: 1.  Multilevel multifactorial cervical spine degeneration has progressed since 2007.  New spinal stenosis at C4-C5.  Mild intermittent cervical spinal cord mass effect with no compression related cervical spinal cord signal abnormality. 2.  Two small probable demyelinating plaques in the cervical spinal cord dorsally at C2-C3 (not evident in 2007) and dorsally at C7 (stable). 3.  See thoracic and lumbar findings below. 4.  Chronic multinodular goiter.  MRI THORACIC SPINE WITHOUT CONTRAST  Technique: Multiplanar and multiecho pulse sequences of the thoracic spine were obtained without intravenous contrast.  Findings:  Upper thoracic vertebral height and alignment is stable. No upper thoracic marrow edema.  Remote posterior decompression at T11-T12 and T12-L1. Stable posterior paraspinal soft tissues.  Stable visualized thoracic and upper abdominal viscera.  Evidence of predominately dorsal spinal cord signal abnormality at T4-T5 (series 11 image 8), and centered at T6 (image 7).  The former lesion appears to be new or increased.  The latter lesion appears to be chronic but increased.  Anterolisthesis of T11 on T12 is chronic but appears increased since 2012 and is associated with worsening multifactorial spinal stenosis at that level, see below.  There is evidence of associated mild spinal cord signal abnormality (series 9 image 7). Furthermore, most apparent on axial T2-weighted images there is chronic central cord signal abnormality occurring below the T7 level and extending to the stenotic T11-T12 level.  This is not significantly changed.  T1-T2: Stable mild facet hypertrophy.  T2-T3: Negative.  T3-T4: Severe facet degeneration on the right has progressed.  No significant spinal stenosis.  Right T3 foraminal stenosis is mildly progressed.  T4-T5: Facet degeneration on the right has progressed.  Right T4  foraminal stenosis has not significantly changed.  T5-T6: Facet hypertrophy greater on the right is not significantly changed.  T6-T7: Mild circumferential disc bulges stable.  Moderate facet and ligament flavum hypertrophy is stable.  Stable foraminal stenosis with no significant spinal stenosis.  T7-T8: Mild to moderate circumferential disc bulge appears mildly increased.  Moderate facet hypertrophy is increased.  Mild spinal stenosis is stable or mildly increased.  No significant spinal cord mass effect.  Bilateral foraminal stenosis is not significantly changed.  T8-T9: Circumferential disc bulge is stable.  Moderate facet hypertrophy is mildly increased.  No significant spinal stenosis.  T9-T10: Chronic circumferential disc bulges stable to mildly progressed.  Moderate facet hypertrophy is mildly progressed.  Mild spinal stenosis.  No significant spinal cord mass effect.  T10-T11: Moderate to severe facet hypertrophy is mildly progressed. Mild spinal stenosis has mildly increased.  T11-T12: Anterolisthesis appears increased along with disc osteophyte complex and residual posterior element hypertrophy. Spinal stenosis now is severe (series 12 image 36) with associated cord compression.  Bilateral foraminal stenosis is moderate to severe and only mildly increased.  T12-L1:  Stable with no significant spinal or foraminal stenosis. No definite spinal cord signal abnormality at this level.  The conus medullaris occurs at L1-L2, see below.  IMPRESSION: 1.  Progression of anterolisthesis at T11-T12 following remote posterior decompression of this level.  Multifactorial spinal stenosis here has increased and is now severe (series 12 image 36). There is associated increased cord compression.  There is mild cord signal abnormality at this level, unclear if this is stable or increased.  See #2. 2.  Chronic central cord signal abnormality from T7-T8 to the stenotic T11-T12 level appears not significantly changed.  Superimposed probable demyelinating plaques at T4-T5 and centered at T6 are chronic but increased since 2012. 3.  Mild progression of mostly posterior element degeneration in the upper thoracic spine.  Other levels of mild thoracic spinal stenosis have not significantly changed. 4.  See lumbar findings below.  MRI LUMBAR SPINE WITHOUT CONTRAST  Technique: Multiplanar and multiecho pulse sequences of the lumbar spine were obtained without intravenous contrast.  Findings:  Stable visualized abdominal viscera.  There is moderate to severe bladder distention which is new.  Visualized paraspinal soft tissues are within normal limits.  Lumbar vertebral height and alignment has not significantly changed. No marrow edema or evidence of acute osseous abnormality.  L1-L2:  Chronic spinal stenosis with indistinctness of the conus medullaris at this level, favor related to chronic redundancy of the spinal nerve roots occupying the ventral CSF space.  The appearance is not significantly changed.  Disc bulge, facet and ligament flavum hypertrophy here is stable.  L2-L3:  Chronic spinal stenosis related  to right eccentric circumferential disc bulge and moderate facet and ligament flavum hypertrophy is stable and moderate.  L3-L4:  Chronic spinal stenosis related to bulky circumferential disc osteophyte complex, severe facet hypertrophy, and severe ligament flavum hypertrophy, is severe and stable to mildly progressed.  L4-L5:  Chronic spinal stenosis related to bulky right eccentric circumferential disc osteophyte complex and severe facet and severe ligament flavum hypertrophy is severe and not significantly changed.  Trace fluid in the facet joints greater on the left is stable.  Moderate to severe right L4 foraminal stenosis is stable.  L5-S1:  Chronic spinal stenosis at this level appears progressed and is related to mild right eccentric circumferential disc bulge plus severe facet and ligament flavum hypertrophy.  Spinal  stenosis now is moderate to severe, previously mild to moderate.  Lateral recess stenosis now is moderate.  Mild left and moderate to severe right L5 foraminal stenosis has not significantly changed.  IMPRESSION: 1.  Diffuse chronic lumbar spinal stenosis has mildly progressed at L3-L4 and L5-S1 as detailed above.  Spinal stenosis is severe at L3- L4, L4-L5, and L5-S1. 2.  Moderate to severe distention of the bladder.  3. Chronic indistinctness of the conus medullaris felt related to compressed redundant nerve roots in the setting of diffuse lumbar spinal stenosis. 4.  No acute osseous abnormality in the lumbar spine.   Original Report Authenticated By: Erskine Speed, M.D.    Dg Chest Port 1 View  07/21/2012  *RADIOLOGY REPORT*  Clinical Data: Cough and short of breath  PORTABLE CHEST - 1 VIEW  Comparison: 11/28/2005  Findings: Prominent  heart size without heart failure.  Lungs are clear without infiltrate or effusion.  IMPRESSION: No acute abnormality.   Original Report Authenticated By: Janeece Riggers, M.D.     Assessment/Plan: Diagnosis: Paraplegia thoracic myelopathy plus MS 1. Does the need for close, 24 hr/day medical supervision in concert with the patient's rehab needs make it unreasonable for this patient to be served in a less intensive setting? Yes 2. Co-Morbidities requiring supervision/potential complications: DM,HTN 3. Due to bladder management, bowel management, safety, skin/wound care, disease management, medication administration, pain management and patient education, does the patient require 24 hr/day rehab nursing? Yes 4. Does the patient require coordinated care of a physician, rehab nurse, PT (1-2 hrs/day, 5 days/week) and OT (1-2 hrs/day, 5 days/week) to address physical and functional deficits in the context of the above medical diagnosis(es)? Yes Addressing deficits in the following areas: balance, endurance, locomotion, strength, transferring, bowel/bladder control, bathing,  dressing and toileting 5. Can the patient actively participate in an intensive therapy program of at least 3 hrs of therapy per day at least 5 days per week? Yes 6. The potential for patient to make measurable gains while on inpatient rehab is fair 7. Anticipated functional outcomes upon discharge from inpatient rehab are Supervision min assist wheelchair level mobility with PT, Modified independent upper body and supervision lower body ADLs with OT, Not applicable with SLP. 8. Estimated rehab length of stay to reach the above functional goals is: Every week 9. Does the patient have adequate social supports to accommodate these discharge functional goals? Potentially 10. Anticipated D/C setting: Home 11. Anticipated post D/C treatments: HH therapy 12. Overall Rehab/Functional Prognosis: fair  RECOMMENDATIONS: This patient's condition is appropriate for continued rehabilitative care in the following setting: Would recommend CIR once evaluated by neurosurgery. If neurosurgery feels that decompression is required then will reevaluate the patient postoperatively. If the patient does not require thoracic  decompression and then to CIR Patient has agreed to participate in recommended program. Potentially Note that insurance prior authorization may be required for reimbursement for recommended care.  Comment:Recommend neurosurgical consultation with Dr. Newell Coral    07/22/2012

## 2012-07-22 NOTE — Progress Notes (Signed)
Subjective: Feels her breathing has improved somewhat  Exam: Filed Vitals:   07/22/12 0901  BP: 109/78  Pulse: 102  Temp: 97.7 F (36.5 C)  Resp: 18   Gen: In bed, NAD MS: Awake, Alert, follows questions and commands immediately.  MV:HQION, EOMI Motor: 5/5 in UE bilaterally, proximally 2/5 in lower extremities, 4/5 distally.  Sensory: intact to LT  Impression: 72 yo F with MS and new difficulty walking. Differential includes MS flare vs weakness due to underlying disease vs exacerbation of old deficits due to physiological stressor. Would Get MRI T and C spine to assess for enhancing lesions as with frequent true exacerbations, may need to change chronic therapy, and this may or may not represent a true MS flare.   Recommendations: 1) MRI C and T spine to assess for enhancing lesions 2) Continue IV solumedrol   Ritta Slot, MD [facility] [phone]  If 7pm- 7am, please page neurology on call at [phone].

## 2012-07-22 NOTE — Consult Note (Signed)
Reason for Consult:  Thoracic spondylosis, thoracic degenerative disc disease, thoracic spondylolisthesis (T11/12) Referring Physician:  Dr. Richarda Overlie  Laura Roberts is an 72 y.o. female.  HPI:   72 year old right-handed black female, patient mine since 2007, with a history of hypertension, diabetes, and multiple sclerosis (followed by Dr. Avie Echevaria) who is admitted to the hospital yesterday because of respiratory difficulties including significant coughing and weakness.  Patient was admitted to the [facility] hospitalist service and treatment of her respiratory congestion and coughing was initiated, including antibiotic therapy. Patient has been seen in consultation by the neurology hospitalist and treatment was initiated with IV Solu-Medrol for exacerbation of her multiple sclerosis.  Workup has included MRI scans of the cervical, thoracic, and lumbar spine. Neurosurgical consultation was requested by Dr. Susie Cassette for recommendations from a neurosurgical perspective.  MRI scan of the cervical spine shows multilevel cervical spondylosis and degenerative disc disease with areas of mild and moderate stenosis, there are areas of alteration of cord signal associated with her MS, but but no cord signal changes associated with her degenerative changes in the cervical spine.  MRI scan of the thoracic spine shows multiple areas of alteration of cord signal associated with her MS, as well as progressive degenerative changes at the T11-12 level, with increased anterolisthesis T11 on 12 as compared to her July 2012 thoracic MRI and certainly as compared to her April 2007 thoracic MRI. However in April 2007 she presented with significant stenosis at T11-12 she had significant signal from within the spinal cord consistent with myelopathy, this was dramatically improved when viewing her July 2012 MRI, and now there is some faint increased signal in the spinal cord at the T11-12 level, but certainly not nearly the  extent as seen 7 years ago.  MRI of the lumbar spine shows marked multilevel degenerative disc disease and spondylosis, with resulting multilevel multifactorial lumbar stenosis, most severe at L3-4, L4-5, and L5-S1, but also with degenerative changes seen as well at L1-2 and L2-3.  Patient describes her history regarding her lower extremities as if they "felt like they were and nail down to the floor". She explained that she been having the symptoms for about a week. But also she been having difficulties with coughing significantly and she feels this contributed to pain across her low back. She explained that she was having difficulty with her mobility. She's been using a rolling walker for the past 7 years, was having increased difficulty with getting around with the rolling walker. She denies any bowel or bladder incontinence, denies any urinary retention, and denies any constipation.  Patient notes that she has had weakness since her MS was diagnosed over 7 years ago, and that she had bilateral foot drops.  Past surgical history is notable for her T11-12 laminectomy that I performed in May of 2007, left carpal tunnel release by Dr. Salvatore Marvel (she does have right carpal tunnel syndrome, but has not undergone surgery for this), bilateral knee replacements by Dr. Salvatore Marvel.  Past Medical History:  Past Medical History  Diagnosis Date  . Hypertension   . Multiple sclerosis   . Diabetes mellitus without complication     Past Surgical History:  Past Surgical History  Procedure Date  . Replacement total knee bilateral 2004  . Abdominal hysterectomy   . Cholecystectomy   . Back surgery   . Left hand carpal tunnel surgery     Family History:  Family History  Problem Relation Age of Onset  . Multiple  sclerosis Other     neices.     Social History:  reports that she has never smoked. She does not have any smokeless tobacco history on file. She reports that she does not drink  alcohol or use illicit drugs.  Allergies:  Allergies  Allergen Reactions  . Codeine Other (See Comments)    Difficult breathing and skin problem  . Ultram (Tramadol) Other (See Comments)    Difficult breathing and skin peeling    Medications: I have reviewed the patient's current medications.  Review of systems: Notable for those to close described in history of present illness and past medical history, but otherwise unremarkable  Physical Examination: Patient is obese black female, resting in bed, in no acute distress Blood pressure 118/79, pulse 81, temperature 97.9 F (36.6 C), temperature source Oral, resp. rate 18, height 5\' 2"  (1.575 m), weight 92.2 kg (203 lb 4.2 oz), SpO2 95.00%.  Extremity:  Extremities show mild edema in the ankles, no clubbing or cyanosis  Neurological Examination: Mental Status Examination:  Patient is awake alert, oriented, following commands, speech is fluent. Motor Examination:  Iliopsoas is at least 4 bilaterally, left quadriceps is 4+ right quadriceps is 4, left dorsiflexor, EHL, and plantar flexor are 5, right dorsiflexor, EHL, and plantar flexor are 4. Sensory Examination:   Sensation is intact to pinprick in the thighs, legs, and feet bilaterally. Reflex Examination:   Quadriceps are 2 bilaterally, left gastrocnemius is 1, right question is minimal, toes are downgoing bilaterally Gait and Stance Examination:  Not tested due to the nature of her current condition.   Results for orders placed during the hospital encounter of 07/21/12 (from the past 48 hour(s))  BASIC METABOLIC PANEL     Status: Abnormal   Collection Time   07/21/12 11:32 AM      Component Value Range Comment   Sodium 140  135 - 145 mEq/L    Potassium 4.4  3.5 - 5.1 mEq/L    Chloride 100  96 - 112 mEq/L    CO2 26  19 - 32 mEq/L    Glucose, Bld 147 (*) 70 - 99 mg/dL    BUN 16  6 - 23 mg/dL    Creatinine, Ser 1.61  0.50 - 1.10 mg/dL    Calcium 9.7  8.4 - 09.6 mg/dL    GFR calc  non Af Amer 84 (*) >90 mL/min    GFR calc Af Amer >90  >90 mL/min   CBC WITH DIFFERENTIAL     Status: Abnormal   Collection Time   07/21/12 11:32 AM      Component Value Range Comment   WBC 10.7 (*) 4.0 - 10.5 K/uL    RBC 4.54  3.87 - 5.11 MIL/uL    Hemoglobin 13.0  12.0 - 15.0 g/dL    HCT 04.5  40.9 - 81.1 %    MCV 88.3  78.0 - 100.0 fL    MCH 28.6  26.0 - 34.0 pg    MCHC 32.4  30.0 - 36.0 g/dL    RDW 91.4  78.2 - 95.6 %    Platelets 277  150 - 400 K/uL    Neutrophils Relative 61  43 - 77 %    Lymphocytes Relative 26  12 - 46 %    Monocytes Relative 8  3 - 12 %    Eosinophils Relative 4  0 - 5 %    Basophils Relative 1  0 - 1 %    Neutro Abs  6.5  1.7 - 7.7 K/uL    Lymphs Abs 2.8  0.7 - 4.0 K/uL    Monocytes Absolute 0.9  0.1 - 1.0 K/uL    Eosinophils Absolute 0.4  0.0 - 0.7 K/uL    Basophils Absolute 0.1  0.0 - 0.1 K/uL    Smear Review MORPHOLOGY UNREMARKABLE     URINALYSIS, ROUTINE W REFLEX MICROSCOPIC     Status: Abnormal   Collection Time   07/21/12 12:23 PM      Component Value Range Comment   Color, Urine YELLOW  YELLOW    APPearance CLEAR  CLEAR    Specific Gravity, Urine 1.042 (*) 1.005 - 1.030    pH 5.0  5.0 - 8.0    Glucose, UA >1000 (*) NEGATIVE mg/dL    Hgb urine dipstick NEGATIVE  NEGATIVE    Bilirubin Urine NEGATIVE  NEGATIVE    Ketones, ur NEGATIVE  NEGATIVE mg/dL    Protein, ur NEGATIVE  NEGATIVE mg/dL    Urobilinogen, UA 0.2  0.0 - 1.0 mg/dL    Nitrite NEGATIVE  NEGATIVE    Leukocytes, UA NEGATIVE  NEGATIVE   URINE CULTURE     Status: Normal   Collection Time   07/21/12 12:23 PM      Component Value Range Comment   Specimen Description URINE, CATHETERIZED      Special Requests NONE      Culture  Setup Time 07/21/2012 13:04      Colony Count NO GROWTH      Culture NO GROWTH      Report Status 07/22/2012 FINAL     URINE MICROSCOPIC-ADD ON     Status: Abnormal   Collection Time   07/21/12 12:23 PM      Component Value Range Comment   Squamous  Epithelial / LPF FEW (*) RARE    WBC, UA 0-2  <3 WBC/hpf    Bacteria, UA RARE  RARE   GLUCOSE, CAPILLARY     Status: Abnormal   Collection Time   07/21/12  9:20 PM      Component Value Range Comment   Glucose-Capillary 326 (*) 70 - 99 mg/dL   CBC     Status: Abnormal   Collection Time   07/21/12 10:36 PM      Component Value Range Comment   WBC 13.7 (*) 4.0 - 10.5 K/uL    RBC 4.40  3.87 - 5.11 MIL/uL    Hemoglobin 12.6  12.0 - 15.0 g/dL    HCT 57.8  46.9 - 62.9 %    MCV 87.3  78.0 - 100.0 fL    MCH 28.6  26.0 - 34.0 pg    MCHC 32.8  30.0 - 36.0 g/dL    RDW 52.8  41.3 - 24.4 %    Platelets 280  150 - 400 K/uL   CREATININE, SERUM     Status: Abnormal   Collection Time   07/21/12 10:36 PM      Component Value Range Comment   Creatinine, Ser 0.91  0.50 - 1.10 mg/dL    GFR calc non Af Amer 63 (*) >90 mL/min    GFR calc Af Amer 73 (*) >90 mL/min   MAGNESIUM     Status: Normal   Collection Time   07/21/12 10:36 PM      Component Value Range Comment   Magnesium 1.8  1.5 - 2.5 mg/dL   PHOSPHORUS     Status: Normal   Collection Time   07/21/12 10:36 PM  Component Value Range Comment   Phosphorus 3.0  2.3 - 4.6 mg/dL   TSH     Status: Abnormal   Collection Time   07/21/12 10:36 PM      Component Value Range Comment   TSH 0.102 (*) 0.350 - 4.500 uIU/mL   HEMOGLOBIN A1C     Status: Abnormal   Collection Time   07/21/12 10:36 PM      Component Value Range Comment   Hemoglobin A1C 7.6 (*) <5.7 %    Mean Plasma Glucose 171 (*) <117 mg/dL   BASIC METABOLIC PANEL     Status: Abnormal   Collection Time   07/22/12  4:35 AM      Component Value Range Comment   Sodium 140  135 - 145 mEq/L    Potassium 4.5  3.5 - 5.1 mEq/L    Chloride 102  96 - 112 mEq/L    CO2 23  19 - 32 mEq/L    Glucose, Bld 282 (*) 70 - 99 mg/dL    BUN 19  6 - 23 mg/dL    Creatinine, Ser 1.61  0.50 - 1.10 mg/dL    Calcium 9.8  8.4 - 09.6 mg/dL    GFR calc non Af Amer 85 (*) >90 mL/min    GFR calc Af Amer >90   >90 mL/min   CBC     Status: Abnormal   Collection Time   07/22/12  4:35 AM      Component Value Range Comment   WBC 15.4 (*) 4.0 - 10.5 K/uL    RBC 4.37  3.87 - 5.11 MIL/uL    Hemoglobin 12.4  12.0 - 15.0 g/dL    HCT 04.5  40.9 - 81.1 %    MCV 87.2  78.0 - 100.0 fL    MCH 28.4  26.0 - 34.0 pg    MCHC 32.5  30.0 - 36.0 g/dL    RDW 91.4  78.2 - 95.6 %    Platelets 291  150 - 400 K/uL   GLUCOSE, CAPILLARY     Status: Abnormal   Collection Time   07/22/12  7:37 AM      Component Value Range Comment   Glucose-Capillary 284 (*) 70 - 99 mg/dL    Comment 1 Documented in Chart      Comment 2 Notify RN     GLUCOSE, CAPILLARY     Status: Abnormal   Collection Time   07/22/12 12:38 PM      Component Value Range Comment   Glucose-Capillary 253 (*) 70 - 99 mg/dL    Comment 1 Documented in Chart      Comment 2 Notify RN     D-DIMER, QUANTITATIVE     Status: Normal   Collection Time   07/22/12  2:12 PM      Component Value Range Comment   D-Dimer, Quant 0.33  0.00 - 0.48 ug/mL-FEU   GLUCOSE, CAPILLARY     Status: Abnormal   Collection Time   07/22/12  4:34 PM      Component Value Range Comment   Glucose-Capillary 209 (*) 70 - 99 mg/dL    Comment 1 Documented in Chart      Comment 2 Notify RN       Mr Cervical Spine Wo Contrast  07/22/2012  *RADIOLOGY REPORT*  Clinical Data:  72 year old female with history of multiple sclerosis.  Recent shortness of breath and increased weakness. Possible MS FLAIR. Prior spine surgery.  New onset paraplegia and  lumbar pain.  Comparison: [facility] lumbar radiographs 01/07/2011.  Thoracic and lumbar MRI 01/03/2011. Cervical MRI 10/19/2005.  MRI CERVICAL SPINE WITHOUT CONTRAST  Technique:  Multiplanar and multiecho pulse sequences of the cervic al spine, to include the craniocervical junction and cervicothoraci c junction, were obtained according to standard protocol without intravenous contrast.  Findings:  Chronic thyromegaly,  especially the left lobe.  Suspect multiple small thyroid nodules.  Benign multinodular goiter is favored.  Other Visualized paraspinal soft tissues are within normal limits.  Chronic straightening of cervical lordosis.  Mild anterolisthesis of C4 on C5 is increased along with posterior element degeneration at that level, see below. No marrow edema or evidence of acute osseous abnormality.  Cervicomedullary junction is within normal limits.  Visualized brain stem and cerebellum within normal limits.  Evidence of a small probable demyelinating plaque in the dorsal upper cervical cord at C2-C3 (series 24 image 8, series 26 image 5) was not evident on the 2007 study. Tiny chronic left dorsal cord lesion at C7 (series 26 image 19) appears to be stable.  No other definite spinal cord signal abnormality.  C2-C3:  Facet and ligament flavum hypertrophy is increased.  No spinal stenosis.  Mild bilateral C3 foraminal stenosis has mildly increased.  C3-C4:  Progressive disc space loss and endplate degeneration. Less pronounced central disc protrusion.  No significant spinal stenosis.  Moderate bilateral C4 foraminal stenosis has increased.  C4-C5:  No spondylolisthesis.  Progressive disc and endplate degeneration.  New moderate to severe ligament flavum hypertrophy. Chronic moderate facet hypertrophy not significantly changed.  New spinal stenosis with minimal to mild spinal cord flattening. Severe right and moderate left C5 foraminal stenosis has progressed.  C5-C6:  Chronic disc and endplate degeneration has not significantly changed.  Moderate facet hypertrophy has not significantly changed.  Chronic borderline to mild spinal stenosis is stable.  No spinal cord mass effect.  Severe left and moderate to severe right C6 foraminal stenosis at mildly progressed.  C6-C7:  Chronic disc and endplate degeneration.  Stable broad-based central disc component.  Mild to moderate ligament flavum hypertrophy is not significantly changed.   Mild spinal stenosis with minimal to mild spinal cord mass effect has not significantly changed.  Severe left and moderate right C7 foraminal stenosis has not significantly changed.  C7-T1:  Mild anterolisthesis is stable.  Bulky circumferential disc osteophyte complex and facet hypertrophy has not significantly changed.  Stable mild spinal stenosis.  Stable severe bilateral C8 foraminal stenosis.  Thoracic spine findings are below.  IMPRESSION: 1.  Multilevel multifactorial cervical spine degeneration has progressed since 2007.  New spinal stenosis at C4-C5.  Mild intermittent cervical spinal cord mass effect with no compression related cervical spinal cord signal abnormality. 2.  Two small probable demyelinating plaques in the cervical spinal cord dorsally at C2-C3 (not evident in 2007) and dorsally at C7 (stable). 3.  See thoracic and lumbar findings below. 4.  Chronic multinodular goiter.  MRI THORACIC SPINE WITHOUT CONTRAST  Technique: Multiplanar and multiecho pulse sequences of the thoracic spine were obtained without intravenous contrast.  Findings:  Upper thoracic vertebral height and alignment is stable. No upper thoracic marrow edema.  Remote posterior decompression at T11-T12 and T12-L1. Stable posterior paraspinal soft tissues.  Stable visualized thoracic and upper abdominal viscera.  Evidence of predominately dorsal spinal cord signal abnormality at T4-T5 (series 11 image 8), and centered at T6 (image 7).  The former lesion appears to be new or increased.  The  latter lesion appears to be chronic but increased.  Anterolisthesis of T11 on T12 is chronic but appears increased since 2012 and is associated with worsening multifactorial spinal stenosis at that level, see below.  There is evidence of associated mild spinal cord signal abnormality (series 9 image 7). Furthermore, most apparent on axial T2-weighted images there is chronic central cord signal abnormality occurring below the T7 level and  extending to the stenotic T11-T12 level.  This is not significantly changed.  T1-T2: Stable mild facet hypertrophy.  T2-T3: Negative.  T3-T4: Severe facet degeneration on the right has progressed.  No significant spinal stenosis.  Right T3 foraminal stenosis is mildly progressed.  T4-T5: Facet degeneration on the right has progressed.  Right T4 foraminal stenosis has not significantly changed.  T5-T6: Facet hypertrophy greater on the right is not significantly changed.  T6-T7: Mild circumferential disc bulges stable.  Moderate facet and ligament flavum hypertrophy is stable.  Stable foraminal stenosis with no significant spinal stenosis.  T7-T8: Mild to moderate circumferential disc bulge appears mildly increased.  Moderate facet hypertrophy is increased.  Mild spinal stenosis is stable or mildly increased.  No significant spinal cord mass effect.  Bilateral foraminal stenosis is not significantly changed.  T8-T9: Circumferential disc bulge is stable.  Moderate facet hypertrophy is mildly increased.  No significant spinal stenosis.  T9-T10: Chronic circumferential disc bulges stable to mildly progressed.  Moderate facet hypertrophy is mildly progressed.  Mild spinal stenosis.  No significant spinal cord mass effect.  T10-T11: Moderate to severe facet hypertrophy is mildly progressed. Mild spinal stenosis has mildly increased.  T11-T12: Anterolisthesis appears increased along with disc osteophyte complex and residual posterior element hypertrophy. Spinal stenosis now is severe (series 12 image 36) with associated cord compression.  Bilateral foraminal stenosis is moderate to severe and only mildly increased.  T12-L1:  Stable with no significant spinal or foraminal stenosis. No definite spinal cord signal abnormality at this level.  The conus medullaris occurs at L1-L2, see below.  IMPRESSION: 1.  Progression of anterolisthesis at T11-T12 following remote posterior decompression of this level.  Multifactorial spinal  stenosis here has increased and is now severe (series 12 image 36). There is associated increased cord compression.  There is mild cord signal abnormality at this level, unclear if this is stable or increased.  See #2. 2.  Chronic central cord signal abnormality from T7-T8 to the stenotic T11-T12 level appears not significantly changed. Superimposed probable demyelinating plaques at T4-T5 and centered at T6 are chronic but increased since 2012. 3.  Mild progression of mostly posterior element degeneration in the upper thoracic spine.  Other levels of mild thoracic spinal stenosis have not significantly changed. 4.  See lumbar findings below.  MRI LUMBAR SPINE WITHOUT CONTRAST  Technique: Multiplanar and multiecho pulse sequences of the lumbar spine were obtained without intravenous contrast.  Findings:  Stable visualized abdominal viscera.  There is moderate to severe bladder distention which is new.  Visualized paraspinal soft tissues are within normal limits.  Lumbar vertebral height and alignment has not significantly changed. No marrow edema or evidence of acute osseous abnormality.  L1-L2:  Chronic spinal stenosis with indistinctness of the conus medullaris at this level, favor related to chronic redundancy of the spinal nerve roots occupying the ventral CSF space.  The appearance is not significantly changed.  Disc bulge, facet and ligament flavum hypertrophy here is stable.  L2-L3:  Chronic spinal stenosis related to right eccentric circumferential disc bulge and moderate facet and ligament  flavum hypertrophy is stable and moderate.  L3-L4:  Chronic spinal stenosis related to bulky circumferential disc osteophyte complex, severe facet hypertrophy, and severe ligament flavum hypertrophy, is severe and stable to mildly progressed.  L4-L5:  Chronic spinal stenosis related to bulky right eccentric circumferential disc osteophyte complex and severe facet and severe ligament flavum hypertrophy is severe and not  significantly changed.  Trace fluid in the facet joints greater on the left is stable.  Moderate to severe right L4 foraminal stenosis is stable.  L5-S1:  Chronic spinal stenosis at this level appears progressed and is related to mild right eccentric circumferential disc bulge plus severe facet and ligament flavum hypertrophy.  Spinal stenosis now is moderate to severe, previously mild to moderate.  Lateral recess stenosis now is moderate.  Mild left and moderate to severe right L5 foraminal stenosis has not significantly changed.  IMPRESSION: 1.  Diffuse chronic lumbar spinal stenosis has mildly progressed at L3-L4 and L5-S1 as detailed above.  Spinal stenosis is severe at L3- L4, L4-L5, and L5-S1. 2.  Moderate to severe distention of the bladder.  3. Chronic indistinctness of the conus medullaris felt related to compressed redundant nerve roots in the setting of diffuse lumbar spinal stenosis. 4.  No acute osseous abnormality in the lumbar spine.   Original Report Authenticated By: Erskine Speed, M.D.    Mr Thoracic Spine Wo Contrast  07/22/2012  *RADIOLOGY REPORT*  Clinical Data:  72 year old female with history of multiple sclerosis.  Recent shortness of breath and increased weakness. Possible MS FLAIR. Prior spine surgery.  New onset paraplegia and lumbar pain.  Comparison: [facility] lumbar radiographs 01/07/2011.  Thoracic and lumbar MRI 01/03/2011. Cervical MRI 10/19/2005.  MRI CERVICAL SPINE WITHOUT CONTRAST  Technique:  Multiplanar and multiecho pulse sequences of the cervic al spine, to include the craniocervical junction and cervicothoraci c junction, were obtained according to standard protocol without intravenous contrast.  Findings:  Chronic thyromegaly, especially the left lobe.  Suspect multiple small thyroid nodules.  Benign multinodular goiter is favored.  Other Visualized paraspinal soft tissues are within normal limits.  Chronic straightening of cervical lordosis.   Mild anterolisthesis of C4 on C5 is increased along with posterior element degeneration at that level, see below. No marrow edema or evidence of acute osseous abnormality.  Cervicomedullary junction is within normal limits.  Visualized brain stem and cerebellum within normal limits.  Evidence of a small probable demyelinating plaque in the dorsal upper cervical cord at C2-C3 (series 24 image 8, series 26 image 5) was not evident on the 2007 study. Tiny chronic left dorsal cord lesion at C7 (series 26 image 19) appears to be stable.  No other definite spinal cord signal abnormality.  C2-C3:  Facet and ligament flavum hypertrophy is increased.  No spinal stenosis.  Mild bilateral C3 foraminal stenosis has mildly increased.  C3-C4:  Progressive disc space loss and endplate degeneration. Less pronounced central disc protrusion.  No significant spinal stenosis.  Moderate bilateral C4 foraminal stenosis has increased.  C4-C5:  No spondylolisthesis.  Progressive disc and endplate degeneration.  New moderate to severe ligament flavum hypertrophy. Chronic moderate facet hypertrophy not significantly changed.  New spinal stenosis with minimal to mild spinal cord flattening. Severe right and moderate left C5 foraminal stenosis has progressed.  C5-C6:  Chronic disc and endplate degeneration has not significantly changed.  Moderate facet hypertrophy has not significantly changed.  Chronic borderline to mild spinal stenosis is stable.  No spinal cord  mass effect.  Severe left and moderate to severe right C6 foraminal stenosis at mildly progressed.  C6-C7:  Chronic disc and endplate degeneration.  Stable broad-based central disc component.  Mild to moderate ligament flavum hypertrophy is not significantly changed.  Mild spinal stenosis with minimal to mild spinal cord mass effect has not significantly changed.  Severe left and moderate right C7 foraminal stenosis has not significantly changed.  C7-T1:  Mild anterolisthesis is  stable.  Bulky circumferential disc osteophyte complex and facet hypertrophy has not significantly changed.  Stable mild spinal stenosis.  Stable severe bilateral C8 foraminal stenosis.  Thoracic spine findings are below.  IMPRESSION: 1.  Multilevel multifactorial cervical spine degeneration has progressed since 2007.  New spinal stenosis at C4-C5.  Mild intermittent cervical spinal cord mass effect with no compression related cervical spinal cord signal abnormality. 2.  Two small probable demyelinating plaques in the cervical spinal cord dorsally at C2-C3 (not evident in 2007) and dorsally at C7 (stable). 3.  See thoracic and lumbar findings below. 4.  Chronic multinodular goiter.  MRI THORACIC SPINE WITHOUT CONTRAST  Technique: Multiplanar and multiecho pulse sequences of the thoracic spine were obtained without intravenous contrast.  Findings:  Upper thoracic vertebral height and alignment is stable. No upper thoracic marrow edema.  Remote posterior decompression at T11-T12 and T12-L1. Stable posterior paraspinal soft tissues.  Stable visualized thoracic and upper abdominal viscera.  Evidence of predominately dorsal spinal cord signal abnormality at T4-T5 (series 11 image 8), and centered at T6 (image 7).  The former lesion appears to be new or increased.  The latter lesion appears to be chronic but increased.  Anterolisthesis of T11 on T12 is chronic but appears increased since 2012 and is associated with worsening multifactorial spinal stenosis at that level, see below.  There is evidence of associated mild spinal cord signal abnormality (series 9 image 7). Furthermore, most apparent on axial T2-weighted images there is chronic central cord signal abnormality occurring below the T7 level and extending to the stenotic T11-T12 level.  This is not significantly changed.  T1-T2: Stable mild facet hypertrophy.  T2-T3: Negative.  T3-T4: Severe facet degeneration on the right has progressed.  No significant spinal  stenosis.  Right T3 foraminal stenosis is mildly progressed.  T4-T5: Facet degeneration on the right has progressed.  Right T4 foraminal stenosis has not significantly changed.  T5-T6: Facet hypertrophy greater on the right is not significantly changed.  T6-T7: Mild circumferential disc bulges stable.  Moderate facet and ligament flavum hypertrophy is stable.  Stable foraminal stenosis with no significant spinal stenosis.  T7-T8: Mild to moderate circumferential disc bulge appears mildly increased.  Moderate facet hypertrophy is increased.  Mild spinal stenosis is stable or mildly increased.  No significant spinal cord mass effect.  Bilateral foraminal stenosis is not significantly changed.  T8-T9: Circumferential disc bulge is stable.  Moderate facet hypertrophy is mildly increased.  No significant spinal stenosis.  T9-T10: Chronic circumferential disc bulges stable to mildly progressed.  Moderate facet hypertrophy is mildly progressed.  Mild spinal stenosis.  No significant spinal cord mass effect.  T10-T11: Moderate to severe facet hypertrophy is mildly progressed. Mild spinal stenosis has mildly increased.  T11-T12: Anterolisthesis appears increased along with disc osteophyte complex and residual posterior element hypertrophy. Spinal stenosis now is severe (series 12 image 36) with associated cord compression.  Bilateral foraminal stenosis is moderate to severe and only mildly increased.  T12-L1:  Stable with no significant spinal or foraminal stenosis. No definite  spinal cord signal abnormality at this level.  The conus medullaris occurs at L1-L2, see below.  IMPRESSION: 1.  Progression of anterolisthesis at T11-T12 following remote posterior decompression of this level.  Multifactorial spinal stenosis here has increased and is now severe (series 12 image 36). There is associated increased cord compression.  There is mild cord signal abnormality at this level, unclear if this is stable or increased.  See #2. 2.   Chronic central cord signal abnormality from T7-T8 to the stenotic T11-T12 level appears not significantly changed. Superimposed probable demyelinating plaques at T4-T5 and centered at T6 are chronic but increased since 2012. 3.  Mild progression of mostly posterior element degeneration in the upper thoracic spine.  Other levels of mild thoracic spinal stenosis have not significantly changed. 4.  See lumbar findings below.  MRI LUMBAR SPINE WITHOUT CONTRAST  Technique: Multiplanar and multiecho pulse sequences of the lumbar spine were obtained without intravenous contrast.  Findings:  Stable visualized abdominal viscera.  There is moderate to severe bladder distention which is new.  Visualized paraspinal soft tissues are within normal limits.  Lumbar vertebral height and alignment has not significantly changed. No marrow edema or evidence of acute osseous abnormality.  L1-L2:  Chronic spinal stenosis with indistinctness of the conus medullaris at this level, favor related to chronic redundancy of the spinal nerve roots occupying the ventral CSF space.  The appearance is not significantly changed.  Disc bulge, facet and ligament flavum hypertrophy here is stable.  L2-L3:  Chronic spinal stenosis related to right eccentric circumferential disc bulge and moderate facet and ligament flavum hypertrophy is stable and moderate.  L3-L4:  Chronic spinal stenosis related to bulky circumferential disc osteophyte complex, severe facet hypertrophy, and severe ligament flavum hypertrophy, is severe and stable to mildly progressed.  L4-L5:  Chronic spinal stenosis related to bulky right eccentric circumferential disc osteophyte complex and severe facet and severe ligament flavum hypertrophy is severe and not significantly changed.  Trace fluid in the facet joints greater on the left is stable.  Moderate to severe right L4 foraminal stenosis is stable.  L5-S1:  Chronic spinal stenosis at this level appears progressed and is  related to mild right eccentric circumferential disc bulge plus severe facet and ligament flavum hypertrophy.  Spinal stenosis now is moderate to severe, previously mild to moderate.  Lateral recess stenosis now is moderate.  Mild left and moderate to severe right L5 foraminal stenosis has not significantly changed.  IMPRESSION: 1.  Diffuse chronic lumbar spinal stenosis has mildly progressed at L3-L4 and L5-S1 as detailed above.  Spinal stenosis is severe at L3- L4, L4-L5, and L5-S1. 2.  Moderate to severe distention of the bladder.  3. Chronic indistinctness of the conus medullaris felt related to compressed redundant nerve roots in the setting of diffuse lumbar spinal stenosis. 4.  No acute osseous abnormality in the lumbar spine.   Original Report Authenticated By: Erskine Speed, M.D.    Mr Lumbar Spine Wo Contrast  07/22/2012  *RADIOLOGY REPORT*  Clinical Data:  73 year old female with history of multiple sclerosis.  Recent shortness of breath and increased weakness. Possible MS FLAIR. Prior spine surgery.  New onset paraplegia and lumbar pain.  Comparison: [facility] lumbar radiographs 01/07/2011.  Thoracic and lumbar MRI 01/03/2011. Cervical MRI 10/19/2005.  MRI CERVICAL SPINE WITHOUT CONTRAST  Technique:  Multiplanar and multiecho pulse sequences of the cervic al spine, to include the craniocervical junction and cervicothoraci c junction, were obtained according  to standard protocol without intravenous contrast.  Findings:  Chronic thyromegaly, especially the left lobe.  Suspect multiple small thyroid nodules.  Benign multinodular goiter is favored.  Other Visualized paraspinal soft tissues are within normal limits.  Chronic straightening of cervical lordosis.  Mild anterolisthesis of C4 on C5 is increased along with posterior element degeneration at that level, see below. No marrow edema or evidence of acute osseous abnormality.  Cervicomedullary junction is within normal limits.   Visualized brain stem and cerebellum within normal limits.  Evidence of a small probable demyelinating plaque in the dorsal upper cervical cord at C2-C3 (series 24 image 8, series 26 image 5) was not evident on the 2007 study. Tiny chronic left dorsal cord lesion at C7 (series 26 image 19) appears to be stable.  No other definite spinal cord signal abnormality.  C2-C3:  Facet and ligament flavum hypertrophy is increased.  No spinal stenosis.  Mild bilateral C3 foraminal stenosis has mildly increased.  C3-C4:  Progressive disc space loss and endplate degeneration. Less pronounced central disc protrusion.  No significant spinal stenosis.  Moderate bilateral C4 foraminal stenosis has increased.  C4-C5:  No spondylolisthesis.  Progressive disc and endplate degeneration.  New moderate to severe ligament flavum hypertrophy. Chronic moderate facet hypertrophy not significantly changed.  New spinal stenosis with minimal to mild spinal cord flattening. Severe right and moderate left C5 foraminal stenosis has progressed.  C5-C6:  Chronic disc and endplate degeneration has not significantly changed.  Moderate facet hypertrophy has not significantly changed.  Chronic borderline to mild spinal stenosis is stable.  No spinal cord mass effect.  Severe left and moderate to severe right C6 foraminal stenosis at mildly progressed.  C6-C7:  Chronic disc and endplate degeneration.  Stable broad-based central disc component.  Mild to moderate ligament flavum hypertrophy is not significantly changed.  Mild spinal stenosis with minimal to mild spinal cord mass effect has not significantly changed.  Severe left and moderate right C7 foraminal stenosis has not significantly changed.  C7-T1:  Mild anterolisthesis is stable.  Bulky circumferential disc osteophyte complex and facet hypertrophy has not significantly changed.  Stable mild spinal stenosis.  Stable severe bilateral C8 foraminal stenosis.  Thoracic spine findings are below.   IMPRESSION: 1.  Multilevel multifactorial cervical spine degeneration has progressed since 2007.  New spinal stenosis at C4-C5.  Mild intermittent cervical spinal cord mass effect with no compression related cervical spinal cord signal abnormality. 2.  Two small probable demyelinating plaques in the cervical spinal cord dorsally at C2-C3 (not evident in 2007) and dorsally at C7 (stable). 3.  See thoracic and lumbar findings below. 4.  Chronic multinodular goiter.  MRI THORACIC SPINE WITHOUT CONTRAST  Technique: Multiplanar and multiecho pulse sequences of the thoracic spine were obtained without intravenous contrast.  Findings:  Upper thoracic vertebral height and alignment is stable. No upper thoracic marrow edema.  Remote posterior decompression at T11-T12 and T12-L1. Stable posterior paraspinal soft tissues.  Stable visualized thoracic and upper abdominal viscera.  Evidence of predominately dorsal spinal cord signal abnormality at T4-T5 (series 11 image 8), and centered at T6 (image 7).  The former lesion appears to be new or increased.  The latter lesion appears to be chronic but increased.  Anterolisthesis of T11 on T12 is chronic but appears increased since 2012 and is associated with worsening multifactorial spinal stenosis at that level, see below.  There is evidence of associated mild spinal cord signal abnormality (series 9 image 7). Furthermore, most apparent  on axial T2-weighted images there is chronic central cord signal abnormality occurring below the T7 level and extending to the stenotic T11-T12 level.  This is not significantly changed.  T1-T2: Stable mild facet hypertrophy.  T2-T3: Negative.  T3-T4: Severe facet degeneration on the right has progressed.  No significant spinal stenosis.  Right T3 foraminal stenosis is mildly progressed.  T4-T5: Facet degeneration on the right has progressed.  Right T4 foraminal stenosis has not significantly changed.  T5-T6: Facet hypertrophy greater on the right is  not significantly changed.  T6-T7: Mild circumferential disc bulges stable.  Moderate facet and ligament flavum hypertrophy is stable.  Stable foraminal stenosis with no significant spinal stenosis.  T7-T8: Mild to moderate circumferential disc bulge appears mildly increased.  Moderate facet hypertrophy is increased.  Mild spinal stenosis is stable or mildly increased.  No significant spinal cord mass effect.  Bilateral foraminal stenosis is not significantly changed.  T8-T9: Circumferential disc bulge is stable.  Moderate facet hypertrophy is mildly increased.  No significant spinal stenosis.  T9-T10: Chronic circumferential disc bulges stable to mildly progressed.  Moderate facet hypertrophy is mildly progressed.  Mild spinal stenosis.  No significant spinal cord mass effect.  T10-T11: Moderate to severe facet hypertrophy is mildly progressed. Mild spinal stenosis has mildly increased.  T11-T12: Anterolisthesis appears increased along with disc osteophyte complex and residual posterior element hypertrophy. Spinal stenosis now is severe (series 12 image 36) with associated cord compression.  Bilateral foraminal stenosis is moderate to severe and only mildly increased.  T12-L1:  Stable with no significant spinal or foraminal stenosis. No definite spinal cord signal abnormality at this level.  The conus medullaris occurs at L1-L2, see below.  IMPRESSION: 1.  Progression of anterolisthesis at T11-T12 following remote posterior decompression of this level.  Multifactorial spinal stenosis here has increased and is now severe (series 12 image 36). There is associated increased cord compression.  There is mild cord signal abnormality at this level, unclear if this is stable or increased.  See #2. 2.  Chronic central cord signal abnormality from T7-T8 to the stenotic T11-T12 level appears not significantly changed. Superimposed probable demyelinating plaques at T4-T5 and centered at T6 are chronic but increased since 2012.  3.  Mild progression of mostly posterior element degeneration in the upper thoracic spine.  Other levels of mild thoracic spinal stenosis have not significantly changed. 4.  See lumbar findings below.  MRI LUMBAR SPINE WITHOUT CONTRAST  Technique: Multiplanar and multiecho pulse sequences of the lumbar spine were obtained without intravenous contrast.  Findings:  Stable visualized abdominal viscera.  There is moderate to severe bladder distention which is new.  Visualized paraspinal soft tissues are within normal limits.  Lumbar vertebral height and alignment has not significantly changed. No marrow edema or evidence of acute osseous abnormality.  L1-L2:  Chronic spinal stenosis with indistinctness of the conus medullaris at this level, favor related to chronic redundancy of the spinal nerve roots occupying the ventral CSF space.  The appearance is not significantly changed.  Disc bulge, facet and ligament flavum hypertrophy here is stable.  L2-L3:  Chronic spinal stenosis related to right eccentric circumferential disc bulge and moderate facet and ligament flavum hypertrophy is stable and moderate.  L3-L4:  Chronic spinal stenosis related to bulky circumferential disc osteophyte complex, severe facet hypertrophy, and severe ligament flavum hypertrophy, is severe and stable to mildly progressed.  L4-L5:  Chronic spinal stenosis related to bulky right eccentric circumferential disc osteophyte complex and severe facet  and severe ligament flavum hypertrophy is severe and not significantly changed.  Trace fluid in the facet joints greater on the left is stable.  Moderate to severe right L4 foraminal stenosis is stable.  L5-S1:  Chronic spinal stenosis at this level appears progressed and is related to mild right eccentric circumferential disc bulge plus severe facet and ligament flavum hypertrophy.  Spinal stenosis now is moderate to severe, previously mild to moderate.  Lateral recess stenosis now is moderate.  Mild  left and moderate to severe right L5 foraminal stenosis has not significantly changed.  IMPRESSION: 1.  Diffuse chronic lumbar spinal stenosis has mildly progressed at L3-L4 and L5-S1 as detailed above.  Spinal stenosis is severe at L3- L4, L4-L5, and L5-S1. 2.  Moderate to severe distention of the bladder.  3. Chronic indistinctness of the conus medullaris felt related to compressed redundant nerve roots in the setting of diffuse lumbar spinal stenosis. 4.  No acute osseous abnormality in the lumbar spine.   Original Report Authenticated By: Erskine Speed, M.D.    Dg Chest Port 1 View  07/21/2012  *RADIOLOGY REPORT*  Clinical Data: Cough and short of breath  PORTABLE CHEST - 1 VIEW  Comparison: 11/28/2005  Findings: Prominent  heart size without heart failure.  Lungs are clear without infiltrate or effusion.  IMPRESSION: No acute abnormality.   Original Report Authenticated By: Janeece Riggers, M.D.      Assessment/Plan: Patient with multiple significant medical conditions including hypertension, diabetes, and multiple sclerosis who has extensive degenerative disease and spondylosis throughout the cervical, thoracic, and lumbar spine as described above, including multiple areas of stenosis. Patient was hospitalized yesterday because of respiratory difficulties and weakness. However her exam overall is fairly similar to my exam from June 2012. However there has been progressive degenerative changes at the T11-12 level including increased anterolisthesis.  I've recommended the patient that we evaluate this further with an CT scan of the lower thoracic and upper lumbar spine. Subsequently we will return to reevaluate her tibial to give her further recommendations  Hewitt Shorts, MD 07/22/2012, 7:16 PM

## 2012-07-22 NOTE — Progress Notes (Signed)
Received patient from ED,admitted due to SOB and increased difficulty walking.Patient has a history of Multiple Sclerosis and HTN.Pt alert and oriented x4.Placed on telemetry.Advised to call for any assistance,she needs one person assist to [facility].Bed alarm turned on,call bell within reach. Clara Smolen Joselita,RN

## 2012-07-22 NOTE — PMR Pre-admission (Signed)
PMR Admission Coordinator Pre-Admission Assessment  Patient: Laura Roberts is an 72 y.o., female MRN: [id number] DOB: 03-Apr-1941 Height: 5\' 2"  (157.5 cm) Weight: 91.2 kg (201 lb 1 oz)              Insurance Information Primary: Medicare Policy#: [id number]    Subscriber: Patient Benefits: [place] Effective Date: 10/30/2007 Deductible: $1126 OOP Max: None Life Max: Unlimited CIR: 100% SNF: 100 days LBD: none Outpatient: 80% Copay: 20%  Home Health: 100% DME: 80%  Copay: 20% Providers: Patient's choice  SECONDARY: [employer]      Policy#: [id number]      Subscriber: self Pre-Cert#: n/a      Employer:retired  Emergency Contact Information Contact Information    Name Relation Home Work Mobile   Van [place] III Son [phone]     Saumya Hukill [facility] [phone]   Wall,Loriene Friend (main [phone]  [phone]     Current Medical History  Patient Admitting Diagnosis: Paraplegia thoracic myelopathy plus MS  History of Present Illness: 72 y.o. female with history of HTN, MS, SOB with URI symptoms for the past week with significant weakness LE with inability to walk. She was evaluated by neurology and sent to ED on 07/21/12 for treatment of MS exacerbation. She was evaluated by Dr. Leroy Kennedy and started on IV solumedrol due to question of MS flare up. Purulent bronchitis with Question of early PNA treated with antibiotic and supportive care. MRI spine with new spinal stenosis C4/5 with two small probably demyelinating plaques at C2/3 and C7-stable, severe multifactorial thoracic spinal stenosis with increased cord compression T 11-12 , diffuse lumbar spinal stenosis severe L3-S1 and moderate to severe distension of the bladder. Respiratory status has improved. PT evaluation done and patient limited by significant weakness.  On 1/23, Dr. Amada Jupiter , neurology, with suspicion of MS flare she was started on steroids and MRI spine obained. Did not  complete the post-contrast images due to claustrophobia. There is some thoracic spinal stenosis and several lesions that could be responsible for her symptoms. Pt completing a three day course of solumedrol today. Do not feel post contrast images would not change neurology management of MS. He recommends obtaining post contrast imaging to see if an enhancing lesion if surgical planning needed and would continue steroids 2 more days for a total of 5 days of IV solumedrol. Dr. Jule Ser saw pt 1/22 with progressive degenerative changes at the T11-12 level including anterolisthesis noted. He recommends further evaluation with  CT scan of lower thoracic and upper lumbar spine . As I spoke with Dr. Jule Ser today 1/23, he plans no surgical intervention at this time nor does pt agree to any surgery at this time.  ast Medical History  Past Medical History  Diagnosis Date  . Hypertension   . Multiple sclerosis   . Diabetes mellitus without complication    Family History  Family history includes Multiple sclerosis in her mother.  Prior Rehab/Hospitalizations: CIR in 2007 After initial dx of MS  Current Medications  Current facility-administered medications:0.9 %  sodium chloride infusion, , Intravenous, Continuous, Vassie Loll, MD, Last Rate: 50 mL/hr at 07/23/12 1200;  acetaminophen (TYLENOL) suppository 650 mg, 650 mg, Rectal, Q6H PRN, Vassie Loll, MD;  acetaminophen (TYLENOL) tablet 650 mg, 650 mg, Oral, Q6H PRN, Vassie Loll, MD, 650 mg at 07/22/12 1157 albuterol (PROVENTIL) (5 MG/ML) 0.5% nebulizer solution 2.5 mg, 2.5 mg, Nebulization, BID, Richarda Overlie, MD, 2.5 mg at 07/23/12 0914;  albuterol (PROVENTIL) (5 MG/ML)  0.5% nebulizer solution 2.5 mg, 2.5 mg, Nebulization, Q6H PRN, Richarda Overlie, MD;  amLODipine (NORVASC) tablet 5 mg, 5 mg, Oral, Daily, Vassie Loll, MD, 5 mg at 07/23/12 1036;  atenolol (TENORMIN) tablet 50 mg, 50 mg, Oral, Daily, Vassie Loll, MD, 50 mg at 07/23/12 1036 Canagliflozin  TABS 100 mg, 100 mg, Oral, Daily, Vassie Loll, MD, 100 mg at 07/23/12 1035;  DULoxetine (CYMBALTA) DR capsule 60 mg, 60 mg, Oral, Daily, Vassie Loll, MD, 60 mg at 07/23/12 1035;  enoxaparin (LOVENOX) injection 40 mg, 40 mg, Subcutaneous, Q24H, Vassie Loll, MD, 40 mg at 07/22/12 2122;  gabapentin (NEURONTIN) capsule 300 mg, 300 mg, Oral, QID, Vassie Loll, MD, 300 mg at 07/23/12 1035 glimepiride (AMARYL) tablet 4 mg, 4 mg, Oral, BID, Vassie Loll, MD, 4 mg at 07/23/12 1035;  guaiFENesin-dextromethorphan (ROBITUSSIN DM) 100-10 MG/5ML syrup 5 mL, 5 mL, Oral, Q4H PRN, Vassie Loll, MD, 5 mL at 07/23/12 0328;  insulin aspart (novoLOG) injection 0-15 Units, 0-15 Units, Subcutaneous, TID WC, Vassie Loll, MD, 3 Units at 07/23/12 0830 insulin aspart (novoLOG) injection 0-5 Units, 0-5 Units, Subcutaneous, QHS, Vassie Loll, MD, 2 Units at 07/22/12 2131;  insulin glargine (LANTUS) injection 15 Units, 15 Units, Subcutaneous, QHS, Richarda Overlie, MD, 15 Units at 07/22/12 2131;  interferon beta-1a (REBIF) injection 44 mcg, 44 mcg, Subcutaneous, 3 times weekly, Richarda Overlie, MD, 44 mcg at 07/22/12 1537 ipratropium (ATROVENT) nebulizer solution 0.5 mg, 0.5 mg, Nebulization, BID, Richarda Overlie, MD, 0.5 mg at 07/23/12 0914;  irbesartan (AVAPRO) tablet 300 mg, 300 mg, Oral, Daily, Vassie Loll, MD, 300 mg at 07/23/12 1035;  levofloxacin (LEVAQUIN) IVPB 500 mg, 500 mg, Intravenous, Q24H, Vassie Loll, MD, 500 mg at 07/22/12 2131 methylPREDNISolone sodium succinate (SOLU-MEDROL) 500 mg in sodium chloride 0.9 % 50 mL IVPB, 500 mg, Intravenous, Daily, Richarda Overlie, MD;  morphine 2 MG/ML injection 1 mg, 1 mg, Intravenous, Q3H PRN, Vassie Loll, MD;  ondansetron [facility]) injection 4 mg, 4 mg, Intravenous, Q6H PRN, Vassie Loll, MD;  ondansetron [facility]) tablet 4 mg, 4 mg, Oral, Q6H PRN, Vassie Loll, MD polyethylene glycol (MIRALAX / GLYCOLAX) packet 17 g, 17 g, Oral, Daily PRN, Vassie Loll, MD;  simvastatin (ZOCOR)  tablet 20 mg, 20 mg, Oral, q1800, Vassie Loll, MD, 20 mg at 07/22/12 1748;  sodium chloride 0.9 % injection 3 mL, 3 mL, Intravenous, Q12H, Vassie Loll, MD, 3 mL at 07/23/12 1038  Patients Current Diet: Carb Control  Precautions / Restrictions Precautions Precautions: Fall Restrictions Weight Bearing Restrictions: No   Prior Activity Level Community (5-7x/wk): 2 x week  Home Assistive Devices / Equipment Home Adaptive Equipment: Bedside commode/3-in-1;Hand-held shower hose;Tub transfer bench;Walker - four wheeled (3 wheeled (triangular) walker)  Prior Functional Level Prior Function Level of Independence: Independent; Amb with walker Able to Take Stairs?: Yes Driving: Yes (rarely) Vocation: Retired  Current Functional Level Cognition  Arousal/Alertness: Awake/alert Overall Cognitive Status: Appears within functional limits for tasks assessed/performed Orientation Level: Oriented X4    Extremity Assessment (includes Sensation/Coordination)  RUE ROM/Strength/Tone: Within functional levels (enough to do a chair push up and scoot back in recliner)  RLE ROM/Strength/Tone: Deficits RLE ROM/Strength/Tone Deficits: Quad 1/5, Anterior tib 1/5, Hip flex 1/5; and pt with subjective report of weakness and decr function of RLE over last few weeks    ADLs  Eating/Feeding: Simulated;Independent Where Assessed - Eating/Feeding: Edge of bed Grooming: Performed;Brushing hair;Set up Where Assessed - Grooming: Unsupported sitting Upper Body Bathing: Simulated;Set up Where Assessed - Upper Body Bathing: Unsupported sitting Lower  Body Bathing: Simulated;Moderate assistance Where Assessed - Lower Body Bathing: Supported sit to stand Upper Body Dressing: Simulated;Set up Where Assessed - Upper Body Dressing: Unsupported sitting Lower Body Dressing: Simulated;Moderate assistance Where Assessed - Lower Body Dressing: Supported sit to stand Toilet Transfer: Freight forwarder: Patient Percentage: 70% Statistician Method: Sit to Barista:  (Sit to stand from raised bed, several steps with chair behin) Toileting - Clothing Manipulation and Hygiene: Simulated;Moderate assistance Where Assessed - Toileting Clothing Manipulation and Hygiene: Standing Transfers/Ambulation Related to ADLs: total A +2 (pt=70% sit to stand from raised surface), total A +2 (pt=-50% stand to sit with decreased control for descent); total A +2 (pt=60%) ambulation with A for walker advancement at times, A for shifting weight at times, and/or A for advancing Rt and/or Lt LE(s) at times. ADL Comments: Brings one leg up and crosses over the other for socks and shoes    Mobility  Bed Mobility: Rolling Left;Left Sidelying to Sit;Sitting - Scoot to Edge of Bed Rolling Left: 5: Supervision;With rail Left Sidelying to Sit: 5: Supervision;HOB elevated;With rails (HOB 20degrees) Sitting - Scoot to Edge of Bed: 4: Min guard    Transfers  Transfers: Sit to Stand;Stand to Sit Sit to Stand: 1: +2 Total assist;From bed Sit to Stand: Patient Percentage: 70% Stand to Sit: 1: +2 Total assist;To chair/3-in-1 Stand to Sit: Patient Percentage: 50%    Ambulation / Gait / Stairs / Wheelchair Mobility  Ambulation/Gait Ambulation/Gait Assistance: 1: +2 Total assist Ambulation/Gait: Patient Percentage: 70% Ambulation Distance (Feet): 8 Feet Assistive device: Rolling walker Ambulation/Gait Assistance Details: cueing for sequence with assist to advance RW, weight shift and chair pulled behind as pt states bil knees buckle with 3 falls this year. pt with RLE crossing over LLE x 2 Gait Pattern: Scissoring;Shuffle;Decreased weight shift to left;Decreased weight shift to right Gait velocity: decreased    Posture / Balance      Special needs/care consideration   Continuous Drip IV:  Yes + IV solumedrol Skin: WNL                            Bowel mgmt: LBM  1/21 Bladder mgmt: incontinent, wears Depends at home Diabetic mgmt: yes Pt has MS meds brought from home, per pharmacy request.  Previous Home Environment Lives With: Alone Available Help at Discharge: Friends;Available PRN/intermittently Type of Home: Other (Comment) (unsure, house or apt) Home Layout: One level Home Access: Level entry Bathroom Shower/Tub: Tub/shower unit Bathroom Accessibility: Yes How Accessible: Accessible via walker Additional Comments: Has been managing independently since rehab stint in 2007  Discharge Living Setting Plans for Discharge Living Setting: Patient's home Type of Home at Discharge: House Discharge Home Layout: One level Discharge Home Access: Ramped entrance Discharge Bathroom Shower/Tub: Tub/shower unit Discharge Bathroom Toilet: Handicapped height Discharge Bathroom Accessibility: Yes How Accessible: Accessible via wheelchair Do you have any problems obtaining your medications?: No  Social/Family/Support Systems Patient Roles: Parent Contact Information: [phone] Anticipated Caregiver: Rollene Rotunda Anticipated Caregiver's Contact Information: [phone], [phone] Ability/Limitations of Caregiver:  (Min assist) Caregiver Availability: 24/7 Discharge Plan Discussed with Primary Caregiver: Yes Is Caregiver In Agreement with Plan?: Yes Does Caregiver/Family have Issues with Lodging/Transportation while Pt is in Rehab?: No Patient states she has a sister in law in [facility] who plans to come and assist as well as local friends that are very supportive.  Goals/Additional Needs Patient/Family Goal for Rehab: Supervision-min assist at w/c  level Expected length of stay: 1 week Cultural Considerations: none Dietary Needs: Carb modified Equipment Needs: TBD Additional Information: Pt is DNR Pt/Family Agrees to Admission and willing to participate: Yes Program Orientation Provided & Reviewed with Pt/Caregiver Including Roles  & Responsibilities:  Yes   Decrease burden of Care through IP rehab admission: n/a  Possible need for SNF placement upon discharge: Not planned, plan is to return home  Patient Condition: This patient's condition remains as documented in the Consult dated 07/22/2012, in which the Rehabilitation Physician determined and documented that the patient's condition is appropriate for intensive rehabilitative care in an inpatient rehabilitation facility.  Preadmission Screen Completed By:  Clois Dupes, 07/23/2012 12:24 PM ______________________________________________________________________   Discussed status with Dr. Wynn Banker on 07/23/2012 at  1223 and received telephone approval for admission today.  Admission Coordinator:  Clois Dupes, time 0454 Date 07/23/2012.

## 2012-07-22 NOTE — Progress Notes (Signed)
Inpatient Diabetes Program Recommendations  AACE/ADA: New Consensus Statement on Inpatient Glycemic Control (2013)  Target Ranges:  Prepandial:   less than 140 mg/dL      Peak postprandial:   less than 180 mg/dL (1-2 hours)      Critically ill patients:  140 - 180 mg/dL    Results for TAKEIA, CIARAVINO (MRN [id number]) as of 07/22/2012 12:55  Ref. Range 07/22/2012 07:37 07/22/2012 12:38  Glucose-Capillary Latest Range: 70-99 mg/dL 657 (H) 846 (H)   Patient getting high dose IV steroids.  CBGs markedly elevated.  Inpatient Diabetes Program Recommendations Insulin - Basal: Please consider adding basal insulin while patient on IV steroids- Lantus 15 units QHS (~0.2 units/kg)  Note: Will follow. Ambrose Finland RN, MSN, CDE Diabetes Coordinator Inpatient Diabetes Program [phone]

## 2012-07-22 NOTE — Progress Notes (Signed)
Rehab Admissions Coordinator Note:  Patient was screened by Clois Dupes for appropriateness for an Inpatient Acute Rehab Consult.  At this time, we are recommending Inpatient Rehab consult. I will contact Dr. Susie Cassette for order.  Clois Dupes 07/22/2012, 12:12 PM  I can be reached at [phone].

## 2012-07-22 NOTE — Progress Notes (Addendum)
[facility] PROGRESS NOTE  Laura Roberts:[id number] DOB: 02/26/1941 DOA: 07/21/2012 PCP: No primary provider on file.  Assessment/Plan: Principal Problem:  *SOB (shortness of breath) Active Problems:  Multiple sclerosis  Weakness  Bronchitis  Multiple sclerosis exacerbation  Cough  HTN (hypertension)  DM (diabetes mellitus)  HLD (hyperlipidemia)  Tachycardia  GERD (gastroesophageal reflux disease)    1-SOB (shortness of breath): With symptoms present for more than one week at this point; no history of fever. Main differential diagnoses will include purulent bronchitis with early pneumonia. Patient reports having some greenish expectoration.  -Will admit to the hospital for further evaluation and treatment.  -Levaquin to be started IV for good coverage of community-acquired pneumonia and also purelent bronchitis.  -Duo-nebs and incentive spirometer  -PRN supplemental oxygen  Will check a d-dimer   2-Multiple sclerosis: Long-standing history of multiple sclerosis followed by Dr. Sandria Manly as an outpatient.  -Will continue home medications and will add IV Solu Medrol 500 mg daily x3 doses with a subsequent prednisone tapering for multiple sclerosis exacerbation.  -Patient will also have an MRI of her lower back, for thorough examination of her LE weakness and lumbar pain.  -Neurology has been consulted further recommendations.    3-Weakness: Most likely secondary to multiple sclerosis flare do to upper respiratory infection.  -Will treat as mentioned above with IV Solu-Medrol.  -Treat underlying precipitating infection.  -Physical therapy evaluation and treatment. May need CIR, will consult neurosurgery Dr Newell Coral  4-Purulent Bronchitis/early pneumonia: Will treat with antibiotics and supportive care. Plan of care as mentioned in problem #1.  5-HTN (hypertension): Continue home medications. Will monitor vital signs. Overall stable   6-DM (diabetes mellitus): W  hemoglobin A1c of 7.6 and use a sliding scale insulin. Will also continue her Amaryl. Metformin on hold during hospitalization. Due to high dose steroids she might require non-acting baseline insulin; will follow her CBG and adjust/start as needed.  Added Lantus   7-HLD (hyperlipidemia): Continue statins.  8-Tachycardia: Most likely secondary to albuterol use. Will follow patient on telemetry to assure no further abnormalities are appreciated. EKG done in the emergency department do not demonstrate any acute ischemic changes. Patient denies chest pain. Will check TSH.  9-GERD (gastroesophageal reflux disease): Continue PPI.   Code Status: full Family Communication: family updated about patient's clinical progress Disposition Plan:  As above    Brief narrative: HPI: Laura Roberts is a 72 y.o. female past medical history significant for diabetes, hypertension, multiple sclerosis and hyperlipidemia; came to the hospital secondary to increase shortness of breath, cough and weakness over the last week prior to admission. Patient reports upper respiratory infection symptoms for the last week, worsening in nature, with associated chills but no fever, greenish expectoration and also significant weakness affecting her lower extremities. Prior to admission patient has been seen by her neurologist Dr. Sandria Manly who at this particular moment believed that the patient is experiencing acute flare of her multiple sclerosis secondary to upper respiratory infection; patient was advised to come to the emergency department for further evaluation and treatment. In the ED patient was found with significant weakness in her lower extremities preventing for her to ambulate safely; she have that x-ray without acute consolidate or infiltrates, mild leukocyte elevation; but with significant difficulty breathing and wheezing. Patient received nebulizer treatment, one dose of solumedrol and place on supplemental oxygen. [facility] called  to admit patient for further evaluation and treatment   Consultants:  Neurology  Procedures:  MRI results pending  Antibiotics:  Levofloxacin  HPI/Subjective: Feels her breathing has improved somewhat   Objective: Filed Vitals:   07/21/12 2202 07/22/12 0521 07/22/12 0836 07/22/12 0901  BP:  129/69  109/78  Pulse:  90  102  Temp:  97.8 F (36.6 C)  97.7 F (36.5 C)  TempSrc:  Oral  Oral  Resp: 18 18  18   Height:      Weight:      SpO2: 95% 92% 93% 94%    Intake/Output Summary (Last 24 hours) at 07/22/12 1215 Last data filed at 07/22/12 0900  Gross per 24 hour  Intake 1035.83 ml  Output    800 ml  Net 235.83 ml    Exam:  General: Afebrile, mild shortness of breath when trying to speak in full sentences; no acute distress  Eyes: PERRLA, no nystagmus, no icterus, extraocular muscles intact.  ENT: No erythema, no exudates, good dentition, moist mucous membranes, no drainage out of her ears or nostrils.  Neck: Supple, no thyromegaly.  Cardiovascular: Tachycardia, no rubs, no gallops, no murmurs, S1 and S2 appreciated on exam.  Respiratory: Expiratory wheezing, diffuse rhonchi, decreased air movement, no crackles.  Abdomen: Obese, soft, nontender, positive bowel sounds, nondistended  Skin: No rash, no petechiae.  Musculoskeletal: scars from previous knee surgeries, but no acute joint swelling or erythema on exam.  Psychiatric: Stable, no hallucinations, no suicidal ideation.  Neurologic: Decrease in strength of her lower extremities Labs on Admission:     Data Reviewed: Basic Metabolic Panel:  Lab 07/22/12 1610 07/21/12 2236 07/21/12 1132  NA 140 -- 140  K 4.5 -- 4.4  CL 102 -- 100  CO2 23 -- 26  GLUCOSE 282* -- 147*  BUN 19 -- 16  CREATININE 0.74 0.91 0.76  CALCIUM 9.8 -- 9.7  MG -- 1.8 --  PHOS -- 3.0 --    Liver Function Tests: No results found for this basename: AST:5,ALT:5,ALKPHOS:5,BILITOT:5,PROT:5,ALBUMIN:5 in the last 168 hours No results  found for this basename: LIPASE:5,AMYLASE:5 in the last 168 hours No results found for this basename: AMMONIA:5 in the last 168 hours  CBC:  Lab 07/22/12 0435 07/21/12 2236 07/21/12 1132  WBC 15.4* 13.7* 10.7*  NEUTROABS -- -- 6.5  HGB 12.4 12.6 13.0  HCT 38.1 38.4 40.1  MCV 87.2 87.3 88.3  PLT 291 280 277    Cardiac Enzymes: No results found for this basename: CKTOTAL:5,CKMB:5,CKMBINDEX:5,TROPONINI:5 in the last 168 hours BNP (last 3 results) No results found for this basename: PROBNP:3 in the last 8760 hours   CBG:  Lab 07/21/12 2120  GLUCAP 326*    Recent Results (from the past 240 hour(s))  URINE CULTURE     Status: Normal   Collection Time   07/21/12 12:23 PM      Component Value Range Status Comment   Specimen Description URINE, CATHETERIZED   Final    Special Requests NONE   Final    Culture  Setup Time 07/21/2012 13:04   Final    Colony Count NO GROWTH   Final    Culture NO GROWTH   Final    Report Status 07/22/2012 FINAL   Final      Studies: Dg Chest Port 1 View  07/21/2012  *RADIOLOGY REPORT*  Clinical Data: Cough and short of breath  PORTABLE CHEST - 1 VIEW  Comparison: 11/28/2005  Findings: Prominent  heart size without heart failure.  Lungs are clear without infiltrate or effusion.  IMPRESSION: No acute abnormality.   Original Report Authenticated By: Janeece Riggers,  M.D.     Scheduled Meds:   . ipratropium  0.5 mg Nebulization BID   And  . albuterol  2.5 mg Nebulization BID  . amLODipine  5 mg Oral Daily  . atenolol  50 mg Oral Daily  . Canagliflozin  100 mg Oral Daily  . DULoxetine  60 mg Oral Daily  . enoxaparin (LOVENOX) injection  40 mg Subcutaneous Q24H  . gabapentin  300 mg Oral QID  . glimepiride  4 mg Oral BID  . insulin aspart  0-15 Units Subcutaneous TID WC  . insulin aspart  0-5 Units Subcutaneous QHS  . interferon beta-1a  44 mcg Subcutaneous 3 times weekly  . irbesartan  300 mg Oral Daily  . levofloxacin (LEVAQUIN) IV  500 mg  Intravenous Q24H  . methylPREDNISolone (SOLU-MEDROL) injection  500 mg Intravenous Daily  . simvastatin  20 mg Oral q1800  . sodium chloride  3 mL Intravenous Q12H   Continuous Infusions:   . sodium chloride 50 mL/hr at 07/21/12 2153    Principal Problem:  *SOB (shortness of breath) Active Problems:  Multiple sclerosis  Weakness  Bronchitis  Multiple sclerosis exacerbation  Cough  HTN (hypertension)  DM (diabetes mellitus)  HLD (hyperlipidemia)  Tachycardia  GERD (gastroesophageal reflux disease)    Time spent: 40 minutes   [facility]  [facility] Pager [phone]. If 8PM-8AM, please contact night-coverage at [email or web address], password [facility] 07/22/2012, 12:15 PM  LOS: 1 day

## 2012-07-22 NOTE — Progress Notes (Signed)
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  Also talked about her struggle accepting her MS diagnosis and having to depend on others more and more for things she used to do for herself.  Chaplain provided emotional and spiritual support and prayed with pt. Pt expressed gratitude for the visit.  Will follow up as needed.  Rutherford Nail Chaplain

## 2012-07-22 NOTE — Progress Notes (Signed)
Physical Therapy Evaluation Patient Details Name: Laura Roberts MRN: [id number] DOB: 07-06-1940 Today's Date: 07/22/2012 Time: 0865-7846 PT Time Calculation (min): 23 min  PT Assessment / Plan / Recommendation Clinical Impression  72 yo female with history of MS admitted with SOB and showing signs of MS exacerbation vs weakness due to underlying disease vs exacerbation of old deficits due to physiological stressor; Presents to PT with decr functional mobility compared to baseline of walking modified independently with assistive device; Pt very motivated, familiar with [facility], and will likely be able to reach modified independent functional level with CIR stay; Will follow    PT Assessment  Patient needs continued PT services    Follow Up Recommendations  CIR    Does the patient have the potential to tolerate intense rehabilitation      Barriers to Discharge Decreased caregiver support Still, will likely be able to reach Modified Independent functional level with Intensive Rehab stay    Equipment Recommendations  None recommended by PT    Recommendations for Other Services Rehab consult;OT consult   Frequency Min 3X/week    Precautions / Restrictions Precautions Precautions: Fall Restrictions Weight Bearing Restrictions: No   Pertinent Vitals/Pain no apparent distress       Mobility  Bed Mobility Bed Mobility: Rolling Left;Left Sidelying to Sit;Sitting - Scoot to [place] of Bed Rolling Left: 4: Min assist;With rail Left Sidelying to Sit: 3: Mod assist;With rails Sitting - Scoot to [place] of Bed: 4: Min assist;With rail Details for Bed Mobility Assistance: Requiring heavy mod assist to clear LEs from bed, otherwise very good use of UEs to push up into sitting position; used rails Transfers Transfers: Sit to Stand;Stand to Sit Sit to Stand: 1: +2 Total assist;From bed Sit to Stand: Patient Percentage: 60% Stand to Sit: 1: +2 Total assist;To bed Stand to  Sit: Patient Percentage: 60% Details for Transfer Assistance: Cues for posture, anterior weight shift, technique; Bil knees close guard for safety; Noted pt tending to hyperextend R knee in stance for stability; Support given at bil elbows and gait belt; 2 standing trials, stood approx 30 sec both times    Shoulder Instructions     Exercises     PT Diagnosis: Difficulty walking;Abnormality of gait;Other (comment) (RLE weakness)  PT Problem List: Decreased strength;Decreased range of motion;Decreased activity tolerance;Decreased balance;Decreased mobility;Decreased coordination;Decreased knowledge of use of DME PT Treatment Interventions: DME instruction;Gait training;Functional mobility training;Therapeutic activities;Therapeutic exercise;Balance training;Neuromuscular re-education;Patient/family education;Wheelchair mobility training   PT Goals Acute Rehab PT Goals PT Goal Formulation: With patient Time For Goal Achievement: 07/22/12 Potential to Achieve Goals: Good Pt will Roll Supine to Right Side: with modified independence;with rail PT Goal: Rolling Supine to Right Side - Progress: Goal set today Pt will Roll Supine to Left Side: with modified independence;with rail PT Goal: Rolling Supine to Left Side - Progress: Goal set today Pt will go Supine/Side to Sit: with modified independence;with rail PT Goal: Supine/Side to Sit - Progress: Goal set today Pt will go Sit to Supine/Side: with modified independence;with rail PT Goal: Sit to Supine/Side - Progress: Goal set today Pt will go Sit to Stand: with supervision PT Goal: Sit to Stand - Progress: Goal set today Pt will go Stand to Sit: with supervision PT Goal: Stand to Sit - Progress: Goal set today Pt will Transfer Bed to Chair/Chair to Bed: with supervision PT Transfer Goal: Bed to Chair/Chair to Bed - Progress: Goal set today Pt will Ambulate: 51 - 150 feet;with supervision;with  rolling walker PT Goal: Ambulate - Progress: Goal  set today  Visit Information  Last PT Received On: 07/22/12 Assistance Needed: +2    Subjective Data  Subjective: Has been feeling weaker lately; unable to walk for the past week or so Patient Stated Goal: get stronger and better able to manage   Prior Functioning  Home Living Lives With: Alone Available Help at Discharge: Friend(s);Available PRN/intermittently Type of Home: Other (Comment) (unsure, house or apt) Home Access: Level entry Home Layout: One level Bathroom Shower/Tub: Tub/shower unit Bathroom Accessibility: Yes How Accessible: Accessible via walker Home Adaptive Equipment: Bedside commode/3-in-1;Hand-held shower hose;Walker - rolling;Tub transfer bench Additional Comments: Has been managing independently since rehab stint in 2007 Prior Function Level of Independence: Independent with assistive device(s) Communication Communication: No difficulties    Cognition  Overall Cognitive Status: Appears within functional limits for tasks assessed/performed Arousal/Alertness: Awake/alert Orientation Level: Oriented X4 / Intact Behavior During Session: [facility] for tasks performed    Extremity/Trunk Assessment Right Upper Extremity Assessment RUE ROM/Strength/Tone: [facility] for tasks assessed (for very simple mobility tasks) Left Upper Extremity Assessment LUE ROM/Strength/Tone: WFL for tasks assessed Right Lower Extremity Assessment RLE ROM/Strength/Tone: Deficits RLE ROM/Strength/Tone Deficits: Quad 1/5, Anterior tib 1/5, Hip flex 1/5; and pt with subjective report of weakness and decr function of RLE over last few weeks Left Lower Extremity Assessment LLE ROM/Strength/Tone: Deficits LLE ROM/Strength/Tone Deficits: Grossly 3+/5 throughout   Balance    End of Session PT - End of Session Equipment Utilized During Treatment: Gait belt Activity Tolerance: Patient tolerated treatment well Patient left: in bed (preparing to transport to MRI) Nurse Communication: Mobility status    GP     Olen Pel [place], [place] [phone]  07/22/2012, 11:34 AM

## 2012-07-22 NOTE — Progress Notes (Signed)
Met with patient at bedside to discuss CIR. Pt is familiar with CIR from previous stay in 2007. Pt lives alone, but has family and friends available if assistance needed. Pt would benefit from inpatient rehab prior to return to home and pt is eager to begin rehab. Await notification from pt's MD that she in medically ready to d/c to CIR. For questions, call [phone].

## 2012-07-23 ENCOUNTER — Inpatient Hospital Stay (HOSPITAL_COMMUNITY)
Admission: RE | Admit: 2012-07-23 | Discharge: 2012-08-07 | DRG: 945 | Disposition: A | Discharge status: Home / Self Care | Payer: Medicare Other | Source: Intra-hospital | Attending: Physical Medicine & Rehabilitation | Admitting: Physical Medicine & Rehabilitation

## 2012-07-23 ENCOUNTER — Encounter (HOSPITAL_COMMUNITY): Payer: Self-pay | Admitting: *Deleted

## 2012-07-23 DIAGNOSIS — E119 Type 2 diabetes mellitus without complications: Secondary | ICD-10-CM | POA: Diagnosis present

## 2012-07-23 DIAGNOSIS — G35 Multiple sclerosis: Secondary | ICD-10-CM | POA: Diagnosis present

## 2012-07-23 DIAGNOSIS — R0602 Shortness of breath: Secondary | ICD-10-CM | POA: Diagnosis present

## 2012-07-23 DIAGNOSIS — D72829 Elevated white blood cell count, unspecified: Secondary | ICD-10-CM | POA: Diagnosis present

## 2012-07-23 DIAGNOSIS — Z79899 Other long term (current) drug therapy: Secondary | ICD-10-CM

## 2012-07-23 DIAGNOSIS — R5381 Other malaise: Secondary | ICD-10-CM | POA: Diagnosis present

## 2012-07-23 DIAGNOSIS — J40 Bronchitis, not specified as acute or chronic: Secondary | ICD-10-CM | POA: Diagnosis present

## 2012-07-23 DIAGNOSIS — J189 Pneumonia, unspecified organism: Secondary | ICD-10-CM | POA: Diagnosis present

## 2012-07-23 DIAGNOSIS — M4714 Other spondylosis with myelopathy, thoracic region: Secondary | ICD-10-CM | POA: Diagnosis present

## 2012-07-23 DIAGNOSIS — Z5189 Encounter for other specified aftercare: Principal | ICD-10-CM

## 2012-07-23 DIAGNOSIS — K592 Neurogenic bowel, not elsewhere classified: Secondary | ICD-10-CM | POA: Diagnosis present

## 2012-07-23 DIAGNOSIS — I10 Essential (primary) hypertension: Secondary | ICD-10-CM | POA: Diagnosis present

## 2012-07-23 HISTORY — DX: Myoneural disorder, unspecified: G70.9

## 2012-07-23 LAB — CBC
HCT: 39.3 % (ref 36.0–46.0)
Hemoglobin: 13.3 g/dL (ref 12.0–15.0)
MCH: 29.5 pg (ref 26.0–34.0)
MCHC: 33.8 g/dL (ref 30.0–36.0)
MCV: 87.1 fL (ref 78.0–100.0)
RBC: 4.51 MIL/uL (ref 3.87–5.11)

## 2012-07-23 LAB — GLUCOSE, CAPILLARY
Glucose-Capillary: 194 mg/dL — ABNORMAL HIGH (ref 70–99)
Glucose-Capillary: 207 mg/dL — ABNORMAL HIGH (ref 70–99)
Glucose-Capillary: 218 mg/dL — ABNORMAL HIGH (ref 70–99)
Glucose-Capillary: 213 mg/dL — ABNORMAL HIGH (ref 70–99)

## 2012-07-23 MED ORDER — CANAGLIFLOZIN 100 MG PO TABS
100.0000 mg | ORAL_TABLET | Freq: Every day | ORAL | Status: DC
  Administered 2012-07-24 – 2012-08-07 (×15): 100 mg via ORAL
  Filled 2012-07-23: qty 1

## 2012-07-23 MED ORDER — AMLODIPINE BESYLATE 5 MG PO TABS
5.0000 mg | ORAL_TABLET | Freq: Every day | ORAL | Status: DC
  Administered 2012-07-24 – 2012-08-04 (×12): 5 mg via ORAL
  Filled 2012-07-23 (×14): qty 1

## 2012-07-23 MED ORDER — LEVOFLOXACIN 500 MG PO TABS: 500.0000 mg | ORAL_TABLET | Freq: Every day | ORAL | Status: AC

## 2012-07-23 MED ORDER — ENOXAPARIN SODIUM 40 MG/0.4ML ~~LOC~~ SOLN
40.0000 mg | SUBCUTANEOUS | Status: DC
  Administered 2012-07-23 – 2012-08-06 (×15): 40 mg via SUBCUTANEOUS
  Filled 2012-07-23 (×16): qty 0.4

## 2012-07-23 MED ORDER — ONDANSETRON HCL 4 MG/2ML IJ SOLN: 4.0000 mg | Freq: Four times a day (QID) | INTRAMUSCULAR | Status: DC | PRN

## 2012-07-23 MED ORDER — PREDNISONE 20 MG PO TABS
40.0000 mg | ORAL_TABLET | Freq: Every day | ORAL | Status: AC
  Administered 2012-07-28: 40 mg via ORAL
  Filled 2012-07-23: qty 2

## 2012-07-23 MED ORDER — GLIMEPIRIDE 4 MG PO TABS
4.0000 mg | ORAL_TABLET | Freq: Two times a day (BID) | ORAL | Status: DC
  Administered 2012-07-23 – 2012-08-07 (×30): 4 mg via ORAL
  Filled 2012-07-23 (×33): qty 1

## 2012-07-23 MED ORDER — ALBUTEROL SULFATE (5 MG/ML) 0.5% IN NEBU
2.5000 mg | INHALATION_SOLUTION | Freq: Four times a day (QID) | RESPIRATORY_TRACT | Status: DC
  Administered 2012-07-23 – 2012-07-25 (×8): 2.5 mg via RESPIRATORY_TRACT
  Filled 2012-07-23 (×9): qty 0.5

## 2012-07-23 MED ORDER — ALBUTEROL SULFATE (5 MG/ML) 0.5% IN NEBU
2.5000 mg | INHALATION_SOLUTION | Freq: Four times a day (QID) | RESPIRATORY_TRACT | Status: DC | PRN
  Administered 2012-07-26: 2.5 mg via RESPIRATORY_TRACT

## 2012-07-23 MED ORDER — PREDNISONE 20 MG PO TABS
20.0000 mg | ORAL_TABLET | Freq: Every day | ORAL | Status: AC
  Administered 2012-07-30: 20 mg via ORAL
  Filled 2012-07-23 (×2): qty 1

## 2012-07-23 MED ORDER — SODIUM CHLORIDE 0.9 % IV SOLN
500.0000 mg | Freq: Every day | INTRAVENOUS | Status: AC
  Administered 2012-07-24 – 2012-07-25 (×2): 500 mg via INTRAVENOUS
  Filled 2012-07-23 (×2): qty 4

## 2012-07-23 MED ORDER — ALBUTEROL SULFATE (5 MG/ML) 0.5% IN NEBU: 2.5000 mg | INHALATION_SOLUTION | Freq: Four times a day (QID) | RESPIRATORY_TRACT | Status: DC | PRN

## 2012-07-23 MED ORDER — GABAPENTIN 300 MG PO CAPS
300.0000 mg | ORAL_CAPSULE | Freq: Four times a day (QID) | ORAL | Status: DC
  Administered 2012-07-23 – 2012-08-07 (×59): 300 mg via ORAL
  Filled 2012-07-23 (×65): qty 1

## 2012-07-23 MED ORDER — POLYETHYLENE GLYCOL 3350 17 G PO PACK
17.0000 g | PACK | Freq: Every day | ORAL | Status: DC | PRN
  Filled 2012-07-23: qty 1

## 2012-07-23 MED ORDER — PREDNISONE 50 MG PO TABS
60.0000 mg | ORAL_TABLET | Freq: Every day | ORAL | Status: AC
  Administered 2012-07-26: 60 mg via ORAL
  Filled 2012-07-23: qty 1

## 2012-07-23 MED ORDER — INSULIN ASPART 100 UNIT/ML ~~LOC~~ SOLN
0.0000 [IU] | Freq: Three times a day (TID) | SUBCUTANEOUS | Status: DC
  Administered 2012-07-23: 5 [IU] via SUBCUTANEOUS
  Administered 2012-07-24 (×3): 3 [IU] via SUBCUTANEOUS
  Administered 2012-07-25: 8 [IU] via SUBCUTANEOUS
  Administered 2012-07-25: 3 [IU] via SUBCUTANEOUS
  Administered 2012-07-25: 5 [IU] via SUBCUTANEOUS
  Administered 2012-07-26: 2 [IU] via SUBCUTANEOUS
  Administered 2012-07-26: 5 [IU] via SUBCUTANEOUS
  Administered 2012-07-26: 3 [IU] via SUBCUTANEOUS
  Administered 2012-07-27: 5 [IU] via SUBCUTANEOUS
  Administered 2012-07-28: 2 [IU] via SUBCUTANEOUS
  Administered 2012-07-28: 3 [IU] via SUBCUTANEOUS
  Administered 2012-07-29: 2 [IU] via SUBCUTANEOUS
  Administered 2012-07-29: 5 [IU] via SUBCUTANEOUS
  Administered 2012-07-30: 3 [IU] via SUBCUTANEOUS
  Administered 2012-07-30: 2 [IU] via SUBCUTANEOUS
  Administered 2012-07-31: 5 [IU] via SUBCUTANEOUS
  Administered 2012-07-31 – 2012-08-01 (×2): 2 [IU] via SUBCUTANEOUS
  Administered 2012-08-01: 5 [IU] via SUBCUTANEOUS
  Administered 2012-08-01 – 2012-08-02 (×3): 2 [IU] via SUBCUTANEOUS
  Administered 2012-08-03: 3 [IU] via SUBCUTANEOUS
  Administered 2012-08-03: 2 [IU] via SUBCUTANEOUS
  Administered 2012-08-04: 3 [IU] via SUBCUTANEOUS
  Administered 2012-08-04: 2 [IU] via SUBCUTANEOUS
  Administered 2012-08-05 (×2): 3 [IU] via SUBCUTANEOUS
  Administered 2012-08-05: 5 [IU] via SUBCUTANEOUS
  Administered 2012-08-06: 8 [IU] via SUBCUTANEOUS

## 2012-07-23 MED ORDER — BENZONATATE 100 MG PO CAPS
100.0000 mg | ORAL_CAPSULE | Freq: Three times a day (TID) | ORAL | Status: DC
  Administered 2012-07-23 – 2012-08-03 (×33): 100 mg via ORAL
  Filled 2012-07-23 (×35): qty 1

## 2012-07-23 MED ORDER — SIMVASTATIN 20 MG PO TABS
20.0000 mg | ORAL_TABLET | Freq: Every day | ORAL | Status: DC
  Administered 2012-07-23 – 2012-08-06 (×15): 20 mg via ORAL
  Filled 2012-07-23 (×16): qty 1

## 2012-07-23 MED ORDER — GUAIFENESIN-DM 100-10 MG/5ML PO SYRP: 5.0000 mL | ORAL_SOLUTION | ORAL | Status: DC | PRN

## 2012-07-23 MED ORDER — INTERFERON BETA-1A 44 MCG/0.5ML ~~LOC~~ SOLN
44.0000 ug | SUBCUTANEOUS | Status: DC
  Administered 2012-07-24 – 2012-08-07 (×7): 44 ug via SUBCUTANEOUS
  Filled 2012-07-23 (×4): qty 0.5

## 2012-07-23 MED ORDER — ONDANSETRON HCL 4 MG PO TABS: 4.0000 mg | ORAL_TABLET | Freq: Four times a day (QID) | ORAL | Status: DC | PRN

## 2012-07-23 MED ORDER — PREDNISONE 5 MG PO TABS
5.0000 mg | ORAL_TABLET | Freq: Every day | ORAL | Status: AC
  Administered 2012-08-01: 5 mg via ORAL
  Filled 2012-07-23: qty 1

## 2012-07-23 MED ORDER — PREDNISONE 20 MG PO TABS
30.0000 mg | ORAL_TABLET | Freq: Every day | ORAL | Status: AC
  Administered 2012-07-29: 30 mg via ORAL
  Filled 2012-07-23: qty 1

## 2012-07-23 MED ORDER — IRBESARTAN 300 MG PO TABS
300.0000 mg | ORAL_TABLET | Freq: Every day | ORAL | Status: DC
  Administered 2012-07-24 – 2012-08-07 (×15): 300 mg via ORAL
  Filled 2012-07-23 (×16): qty 1

## 2012-07-23 MED ORDER — ATENOLOL 50 MG PO TABS
50.0000 mg | ORAL_TABLET | Freq: Every day | ORAL | Status: DC
  Administered 2012-07-24 – 2012-08-07 (×14): 50 mg via ORAL
  Filled 2012-07-23 (×16): qty 1

## 2012-07-23 MED ORDER — GUAIFENESIN-DM 100-10 MG/5ML PO SYRP
5.0000 mL | ORAL_SOLUTION | ORAL | Status: DC | PRN
  Administered 2012-07-23 – 2012-08-06 (×14): 5 mL via ORAL
  Filled 2012-07-23 (×14): qty 5

## 2012-07-23 MED ORDER — LEVOFLOXACIN 500 MG PO TABS
500.0000 mg | ORAL_TABLET | Freq: Every day | ORAL | Status: DC
  Administered 2012-07-23 – 2012-07-30 (×8): 500 mg via ORAL
  Filled 2012-07-23 (×9): qty 1

## 2012-07-23 MED ORDER — INSULIN ASPART 100 UNIT/ML ~~LOC~~ SOLN
0.0000 [IU] | Freq: Every day | SUBCUTANEOUS | Status: DC
  Administered 2012-07-23: 2 [IU] via SUBCUTANEOUS
  Administered 2012-07-24 – 2012-07-25 (×2): 3 [IU] via SUBCUTANEOUS
  Administered 2012-07-26: 2 [IU] via SUBCUTANEOUS
  Administered 2012-07-27: 4 [IU] via SUBCUTANEOUS
  Administered 2012-07-30: 2 [IU] via SUBCUTANEOUS

## 2012-07-23 MED ORDER — IPRATROPIUM BROMIDE 0.02 % IN SOLN
0.5000 mg | Freq: Four times a day (QID) | RESPIRATORY_TRACT | Status: DC
  Administered 2012-07-23 – 2012-07-25 (×8): 0.5 mg via RESPIRATORY_TRACT
  Filled 2012-07-23 (×9): qty 2.5

## 2012-07-23 MED ORDER — PREDNISONE 50 MG PO TABS
50.0000 mg | ORAL_TABLET | Freq: Every day | ORAL | Status: AC
  Administered 2012-07-27: 50 mg via ORAL
  Filled 2012-07-23: qty 1

## 2012-07-23 MED ORDER — DULOXETINE HCL 60 MG PO CPEP
60.0000 mg | ORAL_CAPSULE | Freq: Every day | ORAL | Status: DC
  Administered 2012-07-24 – 2012-08-07 (×15): 60 mg via ORAL
  Filled 2012-07-23 (×16): qty 1

## 2012-07-23 MED ORDER — SODIUM CHLORIDE 0.9 % IV SOLN
500.0000 mg | Freq: Every day | INTRAVENOUS | Status: DC
  Administered 2012-07-23: 500 mg via INTRAVENOUS
  Filled 2012-07-23: qty 4

## 2012-07-23 MED ORDER — PREDNISONE 10 MG PO TABS
10.0000 mg | ORAL_TABLET | Freq: Every day | ORAL | Status: AC
  Administered 2012-07-31: 10 mg via ORAL
  Filled 2012-07-23: qty 1

## 2012-07-23 MED ORDER — INSULIN GLARGINE 100 UNIT/ML ~~LOC~~ SOLN
15.0000 [IU] | Freq: Every day | SUBCUTANEOUS | Status: DC
  Administered 2012-07-23 – 2012-07-25 (×3): 15 [IU] via SUBCUTANEOUS

## 2012-07-23 NOTE — H&P (Addendum)
BLE Weakness with inability to walk.   HPI: Laura Roberts is a 72 y.o. female with history of HTN, MS, SOB with URI symptoms for the past week with significant weakness LE with inability to walk. She was evaluated by neurology and sent to ED on 07/21/12 for treatment of MS exacerbation. She was evaluated by Dr. Leroy Kennedy and started on IV solumedrol due to question of MS flare up. Purulent bronchitis with Question of early PNA treated with antibiotic and supportive care. MRI spine with new spinal stenosis C4/5 with two small probably demyelinating plaques at C2/3 and C7-stable, severe multifactorial thoracic spinal stenosis with increased cord compression T 11-12 , diffuse lumbar spinal stenosis severe L3-S1 and moderate to severe distension of the bladder. Respiratory status has improved. Neurology recommends 5 total days IV steroids for MS flare. Has been seen by Dr. Jule Ser who recommends treating MS flare and therapy with follow up on outpatient basis for input on need for surgery is she does not improve. Therapies ongoing and team recommending CIR. Patient admitted today for progressive therapy.   Review of Systems  HENT: Negative for hearing loss.  Eyes: Negative for blurred vision, double vision and redness.  Respiratory: Positive for cough. Negative for sputum production and shortness of breath.  Cardiovascular: Negative for chest pain and palpitations.  Gastrointestinal: Positive for abdominal pain (from coughing.). Negative for heartburn and nausea.  Genitourinary: Positive for urgency and frequency.  Worsening of stress incontinence due to cough.  Musculoskeletal: Positive for back pain.  Neurological: Positive for sensory change (numbness bilateral hands. ), focal weakness (right foot drop. RLE>LLE weakness. ) and weakness. Negative for headaches.  Psychiatric/Behavioral: The patient is nervous/anxious and has insomnia.    Past Medical History   Diagnosis  Date   .  Hypertension      .  Multiple sclerosis    .  Diabetes mellitus without complication     Past Surgical History   Procedure  Date   .  Replacement total knee bilateral  2004   .  Abdominal hysterectomy    .  Cholecystectomy    .  Back surgery    .  Left hand carpal tunnel surgery     Family History   Problem  Relation  Age of Onset   .  Multiple sclerosis  Other       neices.    Social History: reports that she has never smoked. She does not have any smokeless tobacco history on file. She reports that she does not drink alcohol or use illicit drugs.  Allergies:  Allergies   Allergen  Reactions   .  Codeine  Other (See Comments)     Difficult breathing and skin problem   .  Ultram (Tramadol)  Other (See Comments)     Difficult breathing and skin peeling    Medications Prior to Admission   Medication  Sig  Dispense  Refill   .  amLODipine (NORVASC) 5 MG tablet  Take 5 mg by mouth daily.     Marland Kitchen  atenolol (TENORMIN) 50 MG tablet  Take 50 mg by mouth daily.     .  calcium carbonate (TUMS - DOSED IN MG ELEMENTAL CALCIUM) 500 MG chewable tablet  Chew 1 tablet by mouth daily as needed. For heartburn     .  Calcium Carbonate-Vitamin D (CALCIUM + D PO)  Take 0.5 tablets by mouth 2 (two) times daily.     .  Canagliflozin (INVOKANA) 100 MG  TABS  Take 100 mg by mouth daily.     .  DULoxetine (CYMBALTA) 60 MG capsule  Take 60 mg by mouth daily.     Marland Kitchen  gabapentin (NEURONTIN) 300 MG capsule  Take 300 mg by mouth 4 (four) times daily.     Marland Kitchen  glimepiride (AMARYL) 4 MG tablet  Take 4 mg by mouth 2 (two) times daily.     .  interferon beta-1a (REBIF) 44 MCG/0.5ML injection  Inject 44 mcg into the skin 3 (three) times a week. Monday, Wednesday, and Friday     .  Melatonin 3 MG TABS  Take 3 mg by mouth at bedtime as needed. For sleep     .  metFORMIN (GLUCOPHAGE-XR) 500 MG 24 hr tablet  Take 1,000 mg by mouth 2 (two) times daily.     .  naproxen sodium (ANAPROX) 220 MG tablet  Take 220 mg by mouth 2 (two) times daily  with a meal.     .  pravastatin (PRAVACHOL) 40 MG tablet  Take 40 mg by mouth daily.     .  valsartan (DIOVAN) 320 MG tablet  Take 320 mg by mouth daily.      Home:  Home Living  Lives With: Alone  Available Help at Discharge: Friend(s);Available PRN/intermittently  Type of Home: Other (Comment) (unsure, house or apt)  Home Access: Level entry  Home Layout: One level  Bathroom Shower/Tub: Tub/shower unit;Curtain  Firefighter: Standard  Bathroom Accessibility: Yes  How Accessible: Accessible via walker  Home Adaptive Equipment: Bedside commode/3-in-1;Hand-held shower hose;Tub transfer bench;Walker - four wheeled (3 wheeled (triangular) walker)  Additional Comments: Has been managing independently since being in rehab in 2007  Functional History:  Prior Function  Able to Take Stairs?: Yes  Driving: Yes (rarely)  Vocation: Retired  Functional Status:  Mobility:  Bed Mobility  Bed Mobility: Rolling Left;Left Sidelying to Sit;Sitting - Scoot to Edge of Bed  Rolling Left: 5: Supervision;With rail  Left Sidelying to Sit: 5: Supervision;HOB elevated;With rails (HOB 20degrees)  Sitting - Scoot to Edge of Bed: 4: Min guard  Transfers  Transfers: Sit to Stand;Stand to Sit  Sit to Stand: 1: +2 Total assist;From bed  Sit to Stand: Patient Percentage: 70%  Stand to Sit: 1: +2 Total assist;To chair/3-in-1  Stand to Sit: Patient Percentage: 50%  Ambulation/Gait  Ambulation/Gait Assistance: 1: +2 Total assist  Ambulation/Gait: Patient Percentage: 70%  Ambulation Distance (Feet): 8 Feet  Assistive device: Rolling walker  Ambulation/Gait Assistance Details: cueing for sequence with assist to advance RW, weight shift and chair pulled behind as pt states bil knees buckle with 3 falls this year. pt with RLE crossing over LLE x 2  Gait Pattern: Scissoring;Shuffle;Decreased weight shift to left;Decreased weight shift to right  Gait velocity: decreased   ADL:  ADL  Eating/Feeding:  Simulated;Independent  Where Assessed - Eating/Feeding: Edge of bed  Grooming: Performed;Brushing hair;Set up  Where Assessed - Grooming: Unsupported sitting  Upper Body Bathing: Simulated;Set up  Where Assessed - Upper Body Bathing: Unsupported sitting  Lower Body Bathing: Simulated;Moderate assistance  Where Assessed - Lower Body Bathing: Supported sit to stand  Upper Body Dressing: Simulated;Set up  Where Assessed - Upper Body Dressing: Unsupported sitting  Lower Body Dressing: Simulated;Moderate assistance  Where Assessed - Lower Body Dressing: Supported sit to stand  Toilet Transfer: Simulated;+2 Total assistance  Toilet Transfer Method: Sit to Production manager: (Sit to stand from raised bed, several steps with chair  behin)  Transfers/Ambulation Related to ADLs: total A +2 (pt=70% sit to stand from raised surface), total A +2 (pt=-50% stand to sit with decreased control for descent); total A +2 (pt=60%) ambulation with A for walker advancement at times, A for shifting weight at times, and/or A for advancing Rt and/or Lt LE(s) at times.  ADL Comments: Brings one leg up and crosses over the other for socks and shoes  Cognition:  Cognition  Arousal/Alertness: Awake/alert  Orientation Level: Oriented X4  Cognition  Overall Cognitive Status: Appears within functional limits for tasks assessed/performed  Arousal/Alertness: Awake/alert  Orientation Level: Appears intact for tasks assessed  Behavior During Session: [facility] for tasks performed  Blood pressure 144/78, pulse 84, temperature 98.5 F (36.9 C), temperature source Oral, resp. rate 20, height 5\' 2"  (1.575 m), weight 91.2 kg (201 lb 1 oz), SpO2 100.00%.  Physical Exam  Nursing note and vitals reviewed.  Constitutional: She is oriented to person, place, and time. She appears well-developed and well-nourished.  HENT:  Head: Normocephalic.  Left Ear: External ear normal.  Eyes: Pupils are equal, round, and reactive to  light.  Neck: Normal range of motion.  Cardiovascular: Normal rate and regular rhythm.  Pulmonary/Chest: She is in respiratory distress.  Neurological: She is alert and oriented to person, place, and time.   RLE with impaired proprioception At the toes.  Skin: Skin is warm and dry.  Motor strength is 4/5 in bilateral deltoid, biceps, triceps, grip  2 minus in the hip flexors 3 minus knee extensors 1/5 in the right ankle dorsiflexor plantar flexor and 2/5 in the left ankle dorsiflexor plantar flexor  Sensation intact to light touch and proprioception in both lower extremities  Results for orders placed during the hospital encounter of 07/21/12   CREATININE, SERUM Status: Abnormal    Collection Time    07/21/12 10:36 PM   Component  Value  Range  Comment    Creatinine, Ser  0.91  0.50 - 1.10 mg/dL     GFR calc non Af Amer  63 (*)  >90 mL/min     GFR calc Af Amer  73 (*)  >90 mL/min    MAGNESIUM Status: Normal    Collection Time    07/21/12 10:36 PM   Component  Value  Range  Comment    Magnesium  1.8  1.5 - 2.5 mg/dL    PHOSPHORUS Status: Normal    Collection Time    07/21/12 10:36 PM   Component  Value  Range  Comment    Phosphorus  3.0  2.3 - 4.6 mg/dL    TSH Status: Abnormal    Collection Time    07/21/12 10:36 PM   Component  Value  Range  Comment    TSH  0.102 (*)  0.350 - 4.500 uIU/mL    HEMOGLOBIN A1C Status: Abnormal    Collection Time    07/21/12 10:36 PM   Component  Value  Range  Comment    Hemoglobin A1C  7.6 (*)  <5.7 %     Mean Plasma Glucose  171 (*)  <117 mg/dL    GLUCOSE, CAPILLARY Status: Abnormal    Collection Time    07/22/12 9:18 PM   Component  Value  Range  Comment    Glucose-Capillary  208 (*)  70 - 99 mg/dL    GLUCOSE, CAPILLARY Status: Abnormal    Collection Time    07/23/12 7:29 AM   Component  Value  Range  Comment  Glucose-Capillary  194 (*)  70 - 99 mg/dL     Comment 1  Documented in Chart      Comment 2  Notify RN     CBC Status: Abnormal      Collection Time    07/23/12 9:40 AM   Component  Value  Range  Comment    WBC  23.1 (*)  4.0 - 10.5 K/uL     RBC  4.51  3.87 - 5.11 MIL/uL     Hemoglobin  13.3  12.0 - 15.0 g/dL     HCT  16.1  09.6 - 04.5 %     MCV  87.1  78.0 - 100.0 fL     MCH  29.5  26.0 - 34.0 pg     MCHC  33.8  30.0 - 36.0 g/dL     RDW  40.9  81.1 - 91.4 %     Platelets  293  150 - 400 K/uL    GLUCOSE, CAPILLARY Status: Abnormal    Collection Time    07/23/12 12:06 PM   Component  Value  Range  Comment    Glucose-Capillary  207 (*)  70 - 99 mg/dL     Comment 1  Documented in Chart      Comment 2  Notify RN     Mr Cervical Spine Wo Contrast  07/22/2012 *RADIOLOGY REPORT* Clinical Data: 72 year old female with history of multiple sclerosis. Recent shortness of breath and increased weakness. Possible MS FLAIR. Prior spine surgery. New onset paraplegia and lumbar pain. Comparison: [facility] lumbar radiographs 01/07/2011. Thoracic and lumbar MRI 01/03/2011. Cervical MRI 10/19/2005. MRI CERVICAL SPINE WITHOUT CONTRAST  . IMPRESSION: 1. Multilevel multifactorial cervical spine degeneration has progressed since 2007. New spinal stenosis at C4-C5. Mild intermittent cervical spinal cord mass effect with no compression related cervical spinal cord signal abnormality. 2. Two small probable demyelinating plaques in the cervical spinal cord dorsally at C2-C3 (not evident in 2007) and dorsally at C7 (stable). 3. See thoracic and lumbar findings below. 4. Chronic multinodular goiter  MRI THORACIC SPINE WITHOUT CONTRAST:  IMPRESSION: 1. Progression of anterolisthesis at T11-T12 following remote posterior decompression of this level. Multifactorial spinal stenosis here has increased and is now severe (series 12 image 36). There is associated increased cord compression. There is mild cord signal abnormality at this level, unclear if this is stable or increased. See #2. 2. Chronic central cord signal abnormality  from T7-T8 to the stenotic T11-T12 level appears not significantly changed. Superimposed probable demyelinating plaques at T4-T5 and centered at T6 are chronic but increased since 2012. 3. Mild progression of mostly posterior element degeneration in the upper thoracic spine. Other levels of mild thoracic spinal stenosis have not significantly changed. 4. See lumbar findings below.  MRI LUMBAR SPINE WITHOUT CONTRAST  IMPRESSION: 1. Diffuse chronic lumbar spinal stenosis has mildly progressed at L3-L4 and L5-S1 as detailed above. Spinal stenosis is severe at L3- L4, L4-L5, and L5-S1. 2. Moderate to severe distention of the bladder. 3. Chronic indistinctness of the conus medullaris felt related to compressed redundant nerve roots in the setting of diffuse lumbar spinal stenosis. 4. No acute osseous abnormality in the lumbar spine. Original Report Authenticated By: Erskine Speed, M.D.  .  Post Admission Physician Evaluation:  1. Functional deficits secondary to Multiple sclerosis With neurogenic bowel and bladderand thoracic myelopathy. 2. Patient is admitted to receive collaborative, interdisciplinary care between the physiatrist, rehab nursing staff, and therapy team. 3.  Patient's level of medical complexity and substantial therapy needs in context of that medical necessity cannot be provided at a lesser intensity of care such as a SNF. 4. Patient has experienced substantial functional loss from his/her baseline which was documented above under the "Functional History" and "Functional Status" headings. Judging by the patient's diagnosis, physical exam, and functional history, the patient has potential for functional progress which will result in measurable gains while on inpatient rehab. These gains will be of substantial and practical use upon discharge in facilitating mobility and self-care at the household level. 5. Physiatrist will provide 24 hour management of medical needs as well as oversight of the  therapy plan/treatment and provide guidance as appropriate regarding the interaction of the two. 6. 24 hour rehab nursing will assist with bladder management, bowel management, safety, skin/wound care, disease management, medication administration, pain management, patient education  and help integrate therapy concepts, techniques,education, etc. 7. PT will assess and treat for: Pre-gait training wheelchair mobility transfers neuromuscular reeducation safety equipment. Goals are: Modified independent wheelchair level mobility. 8. OT will assess and treat for: ADLs, cognitive perceptual skills, neuromuscular reeducation, safety, equipment,. Goals are: Modified independent upper body ADLs and min assist lower body ADLs. 9. SLP will assess and treat for: Evaluate cognition. Goals are: Utilize strategies for higher level cognitive activities. 10. Case Management and Social Worker will assess and treat for psychological issues and discharge planning. 11. Team conference will be held weekly to assess progress toward goals and to determine barriers to discharge. 12. Patient will receive at least 3 hours of therapy per day at least 5 days per week. 13. ELOS: 2 weeks Prognosis: fair Medical Problem List and Plan:  1. DVT Prophylaxis/Anticoagulation: Pharmaceutical: Lovenox  2. Pain Management: Will continue prn morphine for back pain from coughing spells.  3. Mood: Reporting anxiety causing insomnia. Ego support provided. Continue cymbalta daily. Will have LCSW follow for formal evaluation.  4. Neuropsych: This patient is capable of making decisions on his/her own behalf.  5. DM type 2: continue AC/HS CBG checks. BS uncontrolled due to steroids on board. Continue Amaryl and canagliflozin. Will continue lantus with novolog till off steroids.  6. MS flare: IV solumedrol D#3/5 followed by oral taper? Continue rebiff 3 X week.  7. Bronchitis with likely PNA: Will change Levaquin to po route D#3/7. Schedule  anti-tussives. Use nebs prn SOB/wheezing.  8. Leucocytosis: likely reactive due to PNA as well as steroid effect. No fevers noted and patient clinically looks better. Will monitor for symptoms of infection. Will get f/u CXR in am.  9. HTN: continue norvasc, avapro and atenolol daily. Monitor BP on bid basis.  10. Neurogenic bladder: Check PVRs as distended bladder noted on imaging. Will have nursing toilet every 4 hours and scan to check bladder volumes. UCS 01/21 without growth.

## 2012-07-23 NOTE — Progress Notes (Signed)
Patient arrived to unit via wheelchair.  Alert and oriented x4.  Patient oriented to the unit.  Denies pain.  Vital stable.  Skin intact.  Will continue to monitor.

## 2012-07-23 NOTE — Plan of Care (Signed)
Overall Plan of Care [facility]) Patient Details Name: Laura Roberts MRN: [id number] DOB: 10-Apr-1941  Diagnosis:   MS Exacerbation  Co-morbidities: thoraco lumbar myelopathy, bronchitis, dm, htn  Functional Problem List  Patient demonstrates impairments in the following areas: Balance, Bladder, Endurance and Motor  Basic ADL's: grooming, bathing, dressing and toileting Advanced ADL's: simple meal preparation and light housekeeping  Transfers:  bed mobility, bed to chair, toilet, tub/shower, car and furniture Locomotion:  ambulation and wheelchair mobility  Additional Impairments:  Leisure Awareness  Anticipated Outcomes Item Anticipated Outcome  Eating/Swallowing    Basic self-care  Mod I  Tolieting  Mod I  Bowel/Bladder  Continent of bowel, manage bladder with briefs  Transfers  Mod I bed to [facility], furniture, supervision car  Locomotion   WC mobility mod I indoor and household environments/distances.  Gait short distances min assist (I don't think she will have to walk to get home to mod I level, just transfer safely).    Communication    Cognition    Pain  </=2  Safety/Judgment  No falls with injury  Other     Therapy Plan: PT Intensity: Minimum of 2-3x/day, 45 to 90 minutes PT Frequency: 5 out of 7 days PT Duration Estimated Length of Stay: 10-14 days OT Intensity: Minimum of 1-2 x/day, 45 to 90 minutes OT Frequency: 5 out of 7 days OT Duration/Estimated Length of Stay: ~2 weeks      Team Interventions: Item RN PT OT SLP SW TR Other  Self Care/Advanced ADL Retraining  x x      Neuromuscular Re-Education  x x      Therapeutic Activities  x x      UE/LE Strength Training/ROM  x x      UE/LE Coordination Activities  x x      Visual/Perceptual Remediation/Compensation         DME/Adaptive Equipment Instruction  x x      Therapeutic Exercise  x x      Balance/Vestibular Training  x x      Patient/Family Education x x x      Cognitive Remediation/Compensation          Functional Mobility Training  x x      Ambulation/Gait Training  x       Engineer, structural Propulsion/Positioning  x x      Functional Statistician  x       Community Reintegration  x x      Dysphagia/Aspiration Film/video editor         Bladder Management x        Bowel Management x        Disease Management/Prevention x        Pain Management x x x      Medication Management x        Skin Care/Wound Management x  x      Splinting/Orthotics  x x      Discharge Planning  x x      Psychosocial Support x x x                             Team Discharge Planning: Destination: PT-Home ,OT- Home , SLP-  Projected Follow-up: PT-Home health PT, OT-  None, SLP-  Projected Equipment Needs: PT-None recommended by PT, OT-  ,  SLP-  Patient/family involved in discharge planning: PT- Patient,  OT-Patient, SLP-   MD ELOS: 12 days Medical Rehab Prognosis:  Excellent Assessment: The patient has been admitted for CIR therapies. The team will be addressing, functional mobility, strength, stamina, balance, safety, adaptive techniques/equipment, self-care, bowel and bladder mgt, patient and caregiver education, NMR, ROM, pain mgt. Goals have been set at mod I. Pt is quite motivated..     See Team Conference Notes for weekly updates to the plan of care

## 2012-07-23 NOTE — Progress Notes (Signed)
Report was call to [facility] ar 4000.Pt. Ready to be transfer.

## 2012-07-23 NOTE — Discharge Summary (Signed)
Physician Discharge Summary  Laura Roberts MRN: [id number] DOB/AGE: July 12, 1940 72 y.o.  PCP: No primary provider on file.   Admit date: 07/21/2012 Discharge date: 07/23/2012  Discharge Diagnoses:     *SOB (shortness of breath) Active Problems:  Multiple sclerosis flare  Weakness  Bronchitis  Multiple sclerosis exacerbation  Cough  HTN (hypertension)  DM (diabetes mellitus)  HLD (hyperlipidemia)  Tachycardia  GERD (gastroesophageal reflux disease)     Medication List     As of 07/23/2012  7:44 AM    TAKE these medications         albuterol (5 MG/ML) 0.5% nebulizer solution   Commonly known as: PROVENTIL   Take 0.5 mLs (2.5 mg total) by nebulization every 6 (six) hours as needed for wheezing or shortness of breath.      amLODipine 5 MG tablet   Commonly known as: NORVASC   Take 5 mg by mouth daily.      atenolol 50 MG tablet   Commonly known as: TENORMIN   Take 50 mg by mouth daily.      CALCIUM + D PO   Take 0.5 tablets by mouth 2 (two) times daily.      calcium carbonate 500 MG chewable tablet   Commonly known as: TUMS - dosed in mg elemental calcium   Chew 1 tablet by mouth daily as needed. For heartburn      DULoxetine 60 MG capsule   Commonly known as: CYMBALTA   Take 60 mg by mouth daily.      gabapentin 300 MG capsule   Commonly known as: NEURONTIN   Take 300 mg by mouth 4 (four) times daily.      glimepiride 4 MG tablet   Commonly known as: AMARYL   Take 4 mg by mouth 2 (two) times daily.      guaiFENesin-dextromethorphan 100-10 MG/5ML syrup   Commonly known as: ROBITUSSIN DM   Take 5 mLs by mouth every 4 (four) hours as needed for cough.      INVOKANA 100 MG Tabs   Generic drug: Canagliflozin   Take 100 mg by mouth daily.      levofloxacin 500 MG tablet   Commonly known as: LEVAQUIN   Take 1 tablet (500 mg total) by mouth daily.      metFORMIN 500 MG 24 hr tablet   Commonly known as: GLUCOPHAGE-XR   Take 1,000 mg by mouth 2  (two) times daily.      naproxen sodium 220 MG tablet   Commonly known as: ANAPROX   Take 220 mg by mouth 2 (two) times daily with a meal.      pravastatin 40 MG tablet   Commonly known as: PRAVACHOL   Take 40 mg by mouth daily.      REBIF 44 MCG/0.5ML injection   Generic drug: interferon beta-1a   Inject 44 mcg into the skin 3 (three) times a week. Monday, Wednesday, and Friday      valsartan 320 MG tablet   Commonly known as: DIOVAN   Take 320 mg by mouth daily.      ASK your doctor about these medications         Melatonin 3 MG Tabs   Take 3 mg by mouth at bedtime as needed. For sleep        Discharge Condition: Stable Disposition:  CIR   Consults:  Neurology Neurosurgery   Significant Diagnostic Studies: Mr Cervical Spine Wo Contrast  07/22/2012  *RADIOLOGY REPORT*  Clinical Data:  72 year old female with history of multiple sclerosis.  Recent shortness of breath and increased weakness. Possible MS FLAIR. Prior spine surgery.  New onset paraplegia and lumbar pain.  Comparison: [facility] lumbar radiographs 01/07/2011.  Thoracic and lumbar MRI 01/03/2011. Cervical MRI 10/19/2005.  MRI CERVICAL SPINE WITHOUT CONTRAST  Technique:  Multiplanar and multiecho pulse sequences of the cervic al spine, to include the craniocervical junction and cervicothoraci c junction, were obtained according to standard protocol without intravenous contrast.  Findings:  Chronic thyromegaly, especially the left lobe.  Suspect multiple small thyroid nodules.  Benign multinodular goiter is favored.  Other Visualized paraspinal soft tissues are within normal limits.  Chronic straightening of cervical lordosis.  Mild anterolisthesis of C4 on C5 is increased along with posterior element degeneration at that level, see below. No marrow edema or evidence of acute osseous abnormality.  Cervicomedullary junction is within normal limits.  Visualized brain stem and cerebellum within  normal limits.  Evidence of a small probable demyelinating plaque in the dorsal upper cervical cord at C2-C3 (series 24 image 8, series 26 image 5) was not evident on the 2007 study. Tiny chronic left dorsal cord lesion at C7 (series 26 image 19) appears to be stable.  No other definite spinal cord signal abnormality.  C2-C3:  Facet and ligament flavum hypertrophy is increased.  No spinal stenosis.  Mild bilateral C3 foraminal stenosis has mildly increased.  C3-C4:  Progressive disc space loss and endplate degeneration. Less pronounced central disc protrusion.  No significant spinal stenosis.  Moderate bilateral C4 foraminal stenosis has increased.  C4-C5:  No spondylolisthesis.  Progressive disc and endplate degeneration.  New moderate to severe ligament flavum hypertrophy. Chronic moderate facet hypertrophy not significantly changed.  New spinal stenosis with minimal to mild spinal cord flattening. Severe right and moderate left C5 foraminal stenosis has progressed.  C5-C6:  Chronic disc and endplate degeneration has not significantly changed.  Moderate facet hypertrophy has not significantly changed.  Chronic borderline to mild spinal stenosis is stable.  No spinal cord mass effect.  Severe left and moderate to severe right C6 foraminal stenosis at mildly progressed.  C6-C7:  Chronic disc and endplate degeneration.  Stable broad-based central disc component.  Mild to moderate ligament flavum hypertrophy is not significantly changed.  Mild spinal stenosis with minimal to mild spinal cord mass effect has not significantly changed.  Severe left and moderate right C7 foraminal stenosis has not significantly changed.  C7-T1:  Mild anterolisthesis is stable.  Bulky circumferential disc osteophyte complex and facet hypertrophy has not significantly changed.  Stable mild spinal stenosis.  Stable severe bilateral C8 foraminal stenosis.  Thoracic spine findings are below.  IMPRESSION: 1.  Multilevel multifactorial cervical  spine degeneration has progressed since 2007.  New spinal stenosis at C4-C5.  Mild intermittent cervical spinal cord mass effect with no compression related cervical spinal cord signal abnormality. 2.  Two small probable demyelinating plaques in the cervical spinal cord dorsally at C2-C3 (not evident in 2007) and dorsally at C7 (stable). 3.  See thoracic and lumbar findings below. 4.  Chronic multinodular goiter.  MRI THORACIC SPINE WITHOUT CONTRAST  Technique: Multiplanar and multiecho pulse sequences of the thoracic spine were obtained without intravenous contrast.  Findings:  Upper thoracic vertebral height and alignment is stable. No upper thoracic marrow edema.  Remote posterior decompression at T11-T12 and T12-L1. Stable posterior paraspinal soft tissues.  Stable visualized thoracic and upper abdominal viscera.  Evidence of predominately  dorsal spinal cord signal abnormality at T4-T5 (series 11 image 8), and centered at T6 (image 7).  The former lesion appears to be new or increased.  The latter lesion appears to be chronic but increased.  Anterolisthesis of T11 on T12 is chronic but appears increased since 2012 and is associated with worsening multifactorial spinal stenosis at that level, see below.  There is evidence of associated mild spinal cord signal abnormality (series 9 image 7). Furthermore, most apparent on axial T2-weighted images there is chronic central cord signal abnormality occurring below the T7 level and extending to the stenotic T11-T12 level.  This is not significantly changed.  T1-T2: Stable mild facet hypertrophy.  T2-T3: Negative.  T3-T4: Severe facet degeneration on the right has progressed.  No significant spinal stenosis.  Right T3 foraminal stenosis is mildly progressed.  T4-T5: Facet degeneration on the right has progressed.  Right T4 foraminal stenosis has not significantly changed.  T5-T6: Facet hypertrophy greater on the right is not significantly changed.  T6-T7: Mild  circumferential disc bulges stable.  Moderate facet and ligament flavum hypertrophy is stable.  Stable foraminal stenosis with no significant spinal stenosis.  T7-T8: Mild to moderate circumferential disc bulge appears mildly increased.  Moderate facet hypertrophy is increased.  Mild spinal stenosis is stable or mildly increased.  No significant spinal cord mass effect.  Bilateral foraminal stenosis is not significantly changed.  T8-T9: Circumferential disc bulge is stable.  Moderate facet hypertrophy is mildly increased.  No significant spinal stenosis.  T9-T10: Chronic circumferential disc bulges stable to mildly progressed.  Moderate facet hypertrophy is mildly progressed.  Mild spinal stenosis.  No significant spinal cord mass effect.  T10-T11: Moderate to severe facet hypertrophy is mildly progressed. Mild spinal stenosis has mildly increased.  T11-T12: Anterolisthesis appears increased along with disc osteophyte complex and residual posterior element hypertrophy. Spinal stenosis now is severe (series 12 image 36) with associated cord compression.  Bilateral foraminal stenosis is moderate to severe and only mildly increased.  T12-L1:  Stable with no significant spinal or foraminal stenosis. No definite spinal cord signal abnormality at this level.  The conus medullaris occurs at L1-L2, see below.  IMPRESSION: 1.  Progression of anterolisthesis at T11-T12 following remote posterior decompression of this level.  Multifactorial spinal stenosis here has increased and is now severe (series 12 image 36). There is associated increased cord compression.  There is mild cord signal abnormality at this level, unclear if this is stable or increased.  See #2. 2.  Chronic central cord signal abnormality from T7-T8 to the stenotic T11-T12 level appears not significantly changed. Superimposed probable demyelinating plaques at T4-T5 and centered at T6 are chronic but increased since 2012. 3.  Mild progression of mostly  posterior element degeneration in the upper thoracic spine.  Other levels of mild thoracic spinal stenosis have not significantly changed. 4.  See lumbar findings below.  MRI LUMBAR SPINE WITHOUT CONTRAST  Technique: Multiplanar and multiecho pulse sequences of the lumbar spine were obtained without intravenous contrast.  Findings:  Stable visualized abdominal viscera.  There is moderate to severe bladder distention which is new.  Visualized paraspinal soft tissues are within normal limits.  Lumbar vertebral height and alignment has not significantly changed. No marrow edema or evidence of acute osseous abnormality.  L1-L2:  Chronic spinal stenosis with indistinctness of the conus medullaris at this level, favor related to chronic redundancy of the spinal nerve roots occupying the ventral CSF space.  The appearance is not significantly changed.  Disc bulge, facet and ligament flavum hypertrophy here is stable.  L2-L3:  Chronic spinal stenosis related to right eccentric circumferential disc bulge and moderate facet and ligament flavum hypertrophy is stable and moderate.  L3-L4:  Chronic spinal stenosis related to bulky circumferential disc osteophyte complex, severe facet hypertrophy, and severe ligament flavum hypertrophy, is severe and stable to mildly progressed.  L4-L5:  Chronic spinal stenosis related to bulky right eccentric circumferential disc osteophyte complex and severe facet and severe ligament flavum hypertrophy is severe and not significantly changed.  Trace fluid in the facet joints greater on the left is stable.  Moderate to severe right L4 foraminal stenosis is stable.  L5-S1:  Chronic spinal stenosis at this level appears progressed and is related to mild right eccentric circumferential disc bulge plus severe facet and ligament flavum hypertrophy.  Spinal stenosis now is moderate to severe, previously mild to moderate.  Lateral recess stenosis now is moderate.  Mild left and moderate to severe right  L5 foraminal stenosis has not significantly changed.  IMPRESSION: 1.  Diffuse chronic lumbar spinal stenosis has mildly progressed at L3-L4 and L5-S1 as detailed above.  Spinal stenosis is severe at L3- L4, L4-L5, and L5-S1. 2.  Moderate to severe distention of the bladder.  3. Chronic indistinctness of the conus medullaris felt related to compressed redundant nerve roots in the setting of diffuse lumbar spinal stenosis. 4.  No acute osseous abnormality in the lumbar spine.   Original Report Authenticated By: Erskine Speed, M.D.    Mr Thoracic Spine Wo Contrast  07/22/2012  *RADIOLOGY REPORT*  Clinical Data:  72 year old female with history of multiple sclerosis.  Recent shortness of breath and increased weakness. Possible MS FLAIR. Prior spine surgery.  New onset paraplegia and lumbar pain.  Comparison: [facility] lumbar radiographs 01/07/2011.  Thoracic and lumbar MRI 01/03/2011. Cervical MRI 10/19/2005.  MRI CERVICAL SPINE WITHOUT CONTRAST  Technique:  Multiplanar and multiecho pulse sequences of the cervic al spine, to include the craniocervical junction and cervicothoraci c junction, were obtained according to standard protocol without intravenous contrast.  Findings:  Chronic thyromegaly, especially the left lobe.  Suspect multiple small thyroid nodules.  Benign multinodular goiter is favored.  Other Visualized paraspinal soft tissues are within normal limits.  Chronic straightening of cervical lordosis.  Mild anterolisthesis of C4 on C5 is increased along with posterior element degeneration at that level, see below. No marrow edema or evidence of acute osseous abnormality.  Cervicomedullary junction is within normal limits.  Visualized brain stem and cerebellum within normal limits.  Evidence of a small probable demyelinating plaque in the dorsal upper cervical cord at C2-C3 (series 24 image 8, series 26 image 5) was not evident on the 2007 study. Tiny chronic left dorsal cord  lesion at C7 (series 26 image 19) appears to be stable.  No other definite spinal cord signal abnormality.  C2-C3:  Facet and ligament flavum hypertrophy is increased.  No spinal stenosis.  Mild bilateral C3 foraminal stenosis has mildly increased.  C3-C4:  Progressive disc space loss and endplate degeneration. Less pronounced central disc protrusion.  No significant spinal stenosis.  Moderate bilateral C4 foraminal stenosis has increased.  C4-C5:  No spondylolisthesis.  Progressive disc and endplate degeneration.  New moderate to severe ligament flavum hypertrophy. Chronic moderate facet hypertrophy not significantly changed.  New spinal stenosis with minimal to mild spinal cord flattening. Severe right and moderate left C5 foraminal stenosis has progressed.  C5-C6:  Chronic disc  and endplate degeneration has not significantly changed.  Moderate facet hypertrophy has not significantly changed.  Chronic borderline to mild spinal stenosis is stable.  No spinal cord mass effect.  Severe left and moderate to severe right C6 foraminal stenosis at mildly progressed.  C6-C7:  Chronic disc and endplate degeneration.  Stable broad-based central disc component.  Mild to moderate ligament flavum hypertrophy is not significantly changed.  Mild spinal stenosis with minimal to mild spinal cord mass effect has not significantly changed.  Severe left and moderate right C7 foraminal stenosis has not significantly changed.  C7-T1:  Mild anterolisthesis is stable.  Bulky circumferential disc osteophyte complex and facet hypertrophy has not significantly changed.  Stable mild spinal stenosis.  Stable severe bilateral C8 foraminal stenosis.  Thoracic spine findings are below.  IMPRESSION: 1.  Multilevel multifactorial cervical spine degeneration has progressed since 2007.  New spinal stenosis at C4-C5.  Mild intermittent cervical spinal cord mass effect with no compression related cervical spinal cord signal abnormality. 2.  Two small  probable demyelinating plaques in the cervical spinal cord dorsally at C2-C3 (not evident in 2007) and dorsally at C7 (stable). 3.  See thoracic and lumbar findings below. 4.  Chronic multinodular goiter.  MRI THORACIC SPINE WITHOUT CONTRAST  Technique: Multiplanar and multiecho pulse sequences of the thoracic spine were obtained without intravenous contrast.  Findings:  Upper thoracic vertebral height and alignment is stable. No upper thoracic marrow edema.  Remote posterior decompression at T11-T12 and T12-L1. Stable posterior paraspinal soft tissues.  Stable visualized thoracic and upper abdominal viscera.  Evidence of predominately dorsal spinal cord signal abnormality at T4-T5 (series 11 image 8), and centered at T6 (image 7).  The former lesion appears to be new or increased.  The latter lesion appears to be chronic but increased.  Anterolisthesis of T11 on T12 is chronic but appears increased since 2012 and is associated with worsening multifactorial spinal stenosis at that level, see below.  There is evidence of associated mild spinal cord signal abnormality (series 9 image 7). Furthermore, most apparent on axial T2-weighted images there is chronic central cord signal abnormality occurring below the T7 level and extending to the stenotic T11-T12 level.  This is not significantly changed.  T1-T2: Stable mild facet hypertrophy.  T2-T3: Negative.  T3-T4: Severe facet degeneration on the right has progressed.  No significant spinal stenosis.  Right T3 foraminal stenosis is mildly progressed.  T4-T5: Facet degeneration on the right has progressed.  Right T4 foraminal stenosis has not significantly changed.  T5-T6: Facet hypertrophy greater on the right is not significantly changed.  T6-T7: Mild circumferential disc bulges stable.  Moderate facet and ligament flavum hypertrophy is stable.  Stable foraminal stenosis with no significant spinal stenosis.  T7-T8: Mild to moderate circumferential disc bulge appears  mildly increased.  Moderate facet hypertrophy is increased.  Mild spinal stenosis is stable or mildly increased.  No significant spinal cord mass effect.  Bilateral foraminal stenosis is not significantly changed.  T8-T9: Circumferential disc bulge is stable.  Moderate facet hypertrophy is mildly increased.  No significant spinal stenosis.  T9-T10: Chronic circumferential disc bulges stable to mildly progressed.  Moderate facet hypertrophy is mildly progressed.  Mild spinal stenosis.  No significant spinal cord mass effect.  T10-T11: Moderate to severe facet hypertrophy is mildly progressed. Mild spinal stenosis has mildly increased.  T11-T12: Anterolisthesis appears increased along with disc osteophyte complex and residual posterior element hypertrophy. Spinal stenosis now is severe (series 12 image 36) with  associated cord compression.  Bilateral foraminal stenosis is moderate to severe and only mildly increased.  T12-L1:  Stable with no significant spinal or foraminal stenosis. No definite spinal cord signal abnormality at this level.  The conus medullaris occurs at L1-L2, see below.  IMPRESSION: 1.  Progression of anterolisthesis at T11-T12 following remote posterior decompression of this level.  Multifactorial spinal stenosis here has increased and is now severe (series 12 image 36). There is associated increased cord compression.  There is mild cord signal abnormality at this level, unclear if this is stable or increased.  See #2. 2.  Chronic central cord signal abnormality from T7-T8 to the stenotic T11-T12 level appears not significantly changed. Superimposed probable demyelinating plaques at T4-T5 and centered at T6 are chronic but increased since 2012. 3.  Mild progression of mostly posterior element degeneration in the upper thoracic spine.  Other levels of mild thoracic spinal stenosis have not significantly changed. 4.  See lumbar findings below.  MRI LUMBAR SPINE WITHOUT CONTRAST  Technique:  Multiplanar and multiecho pulse sequences of the lumbar spine were obtained without intravenous contrast.  Findings:  Stable visualized abdominal viscera.  There is moderate to severe bladder distention which is new.  Visualized paraspinal soft tissues are within normal limits.  Lumbar vertebral height and alignment has not significantly changed. No marrow edema or evidence of acute osseous abnormality.  L1-L2:  Chronic spinal stenosis with indistinctness of the conus medullaris at this level, favor related to chronic redundancy of the spinal nerve roots occupying the ventral CSF space.  The appearance is not significantly changed.  Disc bulge, facet and ligament flavum hypertrophy here is stable.  L2-L3:  Chronic spinal stenosis related to right eccentric circumferential disc bulge and moderate facet and ligament flavum hypertrophy is stable and moderate.  L3-L4:  Chronic spinal stenosis related to bulky circumferential disc osteophyte complex, severe facet hypertrophy, and severe ligament flavum hypertrophy, is severe and stable to mildly progressed.  L4-L5:  Chronic spinal stenosis related to bulky right eccentric circumferential disc osteophyte complex and severe facet and severe ligament flavum hypertrophy is severe and not significantly changed.  Trace fluid in the facet joints greater on the left is stable.  Moderate to severe right L4 foraminal stenosis is stable.  L5-S1:  Chronic spinal stenosis at this level appears progressed and is related to mild right eccentric circumferential disc bulge plus severe facet and ligament flavum hypertrophy.  Spinal stenosis now is moderate to severe, previously mild to moderate.  Lateral recess stenosis now is moderate.  Mild left and moderate to severe right L5 foraminal stenosis has not significantly changed.  IMPRESSION: 1.  Diffuse chronic lumbar spinal stenosis has mildly progressed at L3-L4 and L5-S1 as detailed above.  Spinal stenosis is severe at L3- L4, L4-L5,  and L5-S1. 2.  Moderate to severe distention of the bladder.  3. Chronic indistinctness of the conus medullaris felt related to compressed redundant nerve roots in the setting of diffuse lumbar spinal stenosis. 4.  No acute osseous abnormality in the lumbar spine.   Original Report Authenticated By: Erskine Speed, M.D.    Mr Lumbar Spine Wo Contrast  07/22/2012  *RADIOLOGY REPORT*  Clinical Data:  72 year old female with history of multiple sclerosis.  Recent shortness of breath and increased weakness. Possible MS FLAIR. Prior spine surgery.  New onset paraplegia and lumbar pain.  Comparison: [facility] lumbar radiographs 01/07/2011.  Thoracic and lumbar MRI 01/03/2011. Cervical MRI 10/19/2005.  MRI CERVICAL  SPINE WITHOUT CONTRAST  Technique:  Multiplanar and multiecho pulse sequences of the cervic al spine, to include the craniocervical junction and cervicothoraci c junction, were obtained according to standard protocol without intravenous contrast.  Findings:  Chronic thyromegaly, especially the left lobe.  Suspect multiple small thyroid nodules.  Benign multinodular goiter is favored.  Other Visualized paraspinal soft tissues are within normal limits.  Chronic straightening of cervical lordosis.  Mild anterolisthesis of C4 on C5 is increased along with posterior element degeneration at that level, see below. No marrow edema or evidence of acute osseous abnormality.  Cervicomedullary junction is within normal limits.  Visualized brain stem and cerebellum within normal limits.  Evidence of a small probable demyelinating plaque in the dorsal upper cervical cord at C2-C3 (series 24 image 8, series 26 image 5) was not evident on the 2007 study. Tiny chronic left dorsal cord lesion at C7 (series 26 image 19) appears to be stable.  No other definite spinal cord signal abnormality.  C2-C3:  Facet and ligament flavum hypertrophy is increased.  No spinal stenosis.  Mild bilateral C3 foraminal  stenosis has mildly increased.  C3-C4:  Progressive disc space loss and endplate degeneration. Less pronounced central disc protrusion.  No significant spinal stenosis.  Moderate bilateral C4 foraminal stenosis has increased.  C4-C5:  No spondylolisthesis.  Progressive disc and endplate degeneration.  New moderate to severe ligament flavum hypertrophy. Chronic moderate facet hypertrophy not significantly changed.  New spinal stenosis with minimal to mild spinal cord flattening. Severe right and moderate left C5 foraminal stenosis has progressed.  C5-C6:  Chronic disc and endplate degeneration has not significantly changed.  Moderate facet hypertrophy has not significantly changed.  Chronic borderline to mild spinal stenosis is stable.  No spinal cord mass effect.  Severe left and moderate to severe right C6 foraminal stenosis at mildly progressed.  C6-C7:  Chronic disc and endplate degeneration.  Stable broad-based central disc component.  Mild to moderate ligament flavum hypertrophy is not significantly changed.  Mild spinal stenosis with minimal to mild spinal cord mass effect has not significantly changed.  Severe left and moderate right C7 foraminal stenosis has not significantly changed.  C7-T1:  Mild anterolisthesis is stable.  Bulky circumferential disc osteophyte complex and facet hypertrophy has not significantly changed.  Stable mild spinal stenosis.  Stable severe bilateral C8 foraminal stenosis.  Thoracic spine findings are below.  IMPRESSION: 1.  Multilevel multifactorial cervical spine degeneration has progressed since 2007.  New spinal stenosis at C4-C5.  Mild intermittent cervical spinal cord mass effect with no compression related cervical spinal cord signal abnormality. 2.  Two small probable demyelinating plaques in the cervical spinal cord dorsally at C2-C3 (not evident in 2007) and dorsally at C7 (stable). 3.  See thoracic and lumbar findings below. 4.  Chronic multinodular goiter.  MRI THORACIC  SPINE WITHOUT CONTRAST  Technique: Multiplanar and multiecho pulse sequences of the thoracic spine were obtained without intravenous contrast.  Findings:  Upper thoracic vertebral height and alignment is stable. No upper thoracic marrow edema.  Remote posterior decompression at T11-T12 and T12-L1. Stable posterior paraspinal soft tissues.  Stable visualized thoracic and upper abdominal viscera.  Evidence of predominately dorsal spinal cord signal abnormality at T4-T5 (series 11 image 8), and centered at T6 (image 7).  The former lesion appears to be new or increased.  The latter lesion appears to be chronic but increased.  Anterolisthesis of T11 on T12 is chronic but appears increased since 2012 and is associated  with worsening multifactorial spinal stenosis at that level, see below.  There is evidence of associated mild spinal cord signal abnormality (series 9 image 7). Furthermore, most apparent on axial T2-weighted images there is chronic central cord signal abnormality occurring below the T7 level and extending to the stenotic T11-T12 level.  This is not significantly changed.  T1-T2: Stable mild facet hypertrophy.  T2-T3: Negative.  T3-T4: Severe facet degeneration on the right has progressed.  No significant spinal stenosis.  Right T3 foraminal stenosis is mildly progressed.  T4-T5: Facet degeneration on the right has progressed.  Right T4 foraminal stenosis has not significantly changed.  T5-T6: Facet hypertrophy greater on the right is not significantly changed.  T6-T7: Mild circumferential disc bulges stable.  Moderate facet and ligament flavum hypertrophy is stable.  Stable foraminal stenosis with no significant spinal stenosis.  T7-T8: Mild to moderate circumferential disc bulge appears mildly increased.  Moderate facet hypertrophy is increased.  Mild spinal stenosis is stable or mildly increased.  No significant spinal cord mass effect.  Bilateral foraminal stenosis is not significantly changed.  T8-T9:  Circumferential disc bulge is stable.  Moderate facet hypertrophy is mildly increased.  No significant spinal stenosis.  T9-T10: Chronic circumferential disc bulges stable to mildly progressed.  Moderate facet hypertrophy is mildly progressed.  Mild spinal stenosis.  No significant spinal cord mass effect.  T10-T11: Moderate to severe facet hypertrophy is mildly progressed. Mild spinal stenosis has mildly increased.  T11-T12: Anterolisthesis appears increased along with disc osteophyte complex and residual posterior element hypertrophy. Spinal stenosis now is severe (series 12 image 36) with associated cord compression.  Bilateral foraminal stenosis is moderate to severe and only mildly increased.  T12-L1:  Stable with no significant spinal or foraminal stenosis. No definite spinal cord signal abnormality at this level.  The conus medullaris occurs at L1-L2, see below.  IMPRESSION: 1.  Progression of anterolisthesis at T11-T12 following remote posterior decompression of this level.  Multifactorial spinal stenosis here has increased and is now severe (series 12 image 36). There is associated increased cord compression.  There is mild cord signal abnormality at this level, unclear if this is stable or increased.  See #2. 2.  Chronic central cord signal abnormality from T7-T8 to the stenotic T11-T12 level appears not significantly changed. Superimposed probable demyelinating plaques at T4-T5 and centered at T6 are chronic but increased since 2012. 3.  Mild progression of mostly posterior element degeneration in the upper thoracic spine.  Other levels of mild thoracic spinal stenosis have not significantly changed. 4.  See lumbar findings below.  MRI LUMBAR SPINE WITHOUT CONTRAST  Technique: Multiplanar and multiecho pulse sequences of the lumbar spine were obtained without intravenous contrast.  Findings:  Stable visualized abdominal viscera.  There is moderate to severe bladder distention which is new.  Visualized  paraspinal soft tissues are within normal limits.  Lumbar vertebral height and alignment has not significantly changed. No marrow edema or evidence of acute osseous abnormality.  L1-L2:  Chronic spinal stenosis with indistinctness of the conus medullaris at this level, favor related to chronic redundancy of the spinal nerve roots occupying the ventral CSF space.  The appearance is not significantly changed.  Disc bulge, facet and ligament flavum hypertrophy here is stable.  L2-L3:  Chronic spinal stenosis related to right eccentric circumferential disc bulge and moderate facet and ligament flavum hypertrophy is stable and moderate.  L3-L4:  Chronic spinal stenosis related to bulky circumferential disc osteophyte complex, severe facet hypertrophy, and severe  ligament flavum hypertrophy, is severe and stable to mildly progressed.  L4-L5:  Chronic spinal stenosis related to bulky right eccentric circumferential disc osteophyte complex and severe facet and severe ligament flavum hypertrophy is severe and not significantly changed.  Trace fluid in the facet joints greater on the left is stable.  Moderate to severe right L4 foraminal stenosis is stable.  L5-S1:  Chronic spinal stenosis at this level appears progressed and is related to mild right eccentric circumferential disc bulge plus severe facet and ligament flavum hypertrophy.  Spinal stenosis now is moderate to severe, previously mild to moderate.  Lateral recess stenosis now is moderate.  Mild left and moderate to severe right L5 foraminal stenosis has not significantly changed.  IMPRESSION: 1.  Diffuse chronic lumbar spinal stenosis has mildly progressed at L3-L4 and L5-S1 as detailed above.  Spinal stenosis is severe at L3- L4, L4-L5, and L5-S1. 2.  Moderate to severe distention of the bladder.  3. Chronic indistinctness of the conus medullaris felt related to compressed redundant nerve roots in the setting of diffuse lumbar spinal stenosis. 4.  No acute osseous  abnormality in the lumbar spine.   Original Report Authenticated By: Erskine Speed, M.D.    Dg Chest Port 1 View  07/21/2012  *RADIOLOGY REPORT*  Clinical Data: Cough and short of breath  PORTABLE CHEST - 1 VIEW  Comparison: 11/28/2005  Findings: Prominent  heart size without heart failure.  Lungs are clear without infiltrate or effusion.  IMPRESSION: No acute abnormality.   Original Report Authenticated By: Janeece Riggers, M.D.        Microbiology: Recent Results (from the past 240 hour(s))  URINE CULTURE     Status: Normal   Collection Time   07/21/12 12:23 PM      Component Value Range Status Comment   Specimen Description URINE, CATHETERIZED   Final    Special Requests NONE   Final    Culture  Setup Time 07/21/2012 13:04   Final    Colony Count NO GROWTH   Final    Culture NO GROWTH   Final    Report Status 07/22/2012 FINAL   Final      Labs: Results for orders placed during the hospital encounter of 07/21/12 (from the past 48 hour(s))  BASIC METABOLIC PANEL     Status: Abnormal   Collection Time   07/21/12 11:32 AM      Component Value Range Comment   Sodium 140  135 - 145 mEq/L    Potassium 4.4  3.5 - 5.1 mEq/L    Chloride 100  96 - 112 mEq/L    CO2 26  19 - 32 mEq/L    Glucose, Bld 147 (*) 70 - 99 mg/dL    BUN 16  6 - 23 mg/dL    Creatinine, Ser 1.61  0.50 - 1.10 mg/dL    Calcium 9.7  8.4 - 09.6 mg/dL    GFR calc non Af Amer 84 (*) >90 mL/min    GFR calc Af Amer >90  >90 mL/min   CBC WITH DIFFERENTIAL     Status: Abnormal   Collection Time   07/21/12 11:32 AM      Component Value Range Comment   WBC 10.7 (*) 4.0 - 10.5 K/uL    RBC 4.54  3.87 - 5.11 MIL/uL    Hemoglobin 13.0  12.0 - 15.0 g/dL    HCT 04.5  40.9 - 81.1 %    MCV 88.3  78.0 - 100.0  fL    MCH 28.6  26.0 - 34.0 pg    MCHC 32.4  30.0 - 36.0 g/dL    RDW 16.1  09.6 - 04.5 %    Platelets 277  150 - 400 K/uL    Neutrophils Relative 61  43 - 77 %    Lymphocytes Relative 26  12 - 46 %    Monocytes Relative 8  3  - 12 %    Eosinophils Relative 4  0 - 5 %    Basophils Relative 1  0 - 1 %    Neutro Abs 6.5  1.7 - 7.7 K/uL    Lymphs Abs 2.8  0.7 - 4.0 K/uL    Monocytes Absolute 0.9  0.1 - 1.0 K/uL    Eosinophils Absolute 0.4  0.0 - 0.7 K/uL    Basophils Absolute 0.1  0.0 - 0.1 K/uL    Smear Review MORPHOLOGY UNREMARKABLE     URINALYSIS, ROUTINE W REFLEX MICROSCOPIC     Status: Abnormal   Collection Time   07/21/12 12:23 PM      Component Value Range Comment   Color, Urine YELLOW  YELLOW    APPearance CLEAR  CLEAR    Specific Gravity, Urine 1.042 (*) 1.005 - 1.030    pH 5.0  5.0 - 8.0    Glucose, UA >1000 (*) NEGATIVE mg/dL    Hgb urine dipstick NEGATIVE  NEGATIVE    Bilirubin Urine NEGATIVE  NEGATIVE    Ketones, ur NEGATIVE  NEGATIVE mg/dL    Protein, ur NEGATIVE  NEGATIVE mg/dL    Urobilinogen, UA 0.2  0.0 - 1.0 mg/dL    Nitrite NEGATIVE  NEGATIVE    Leukocytes, UA NEGATIVE  NEGATIVE   URINE CULTURE     Status: Normal   Collection Time   07/21/12 12:23 PM      Component Value Range Comment   Specimen Description URINE, CATHETERIZED      Special Requests NONE      Culture  Setup Time 07/21/2012 13:04      Colony Count NO GROWTH      Culture NO GROWTH      Report Status 07/22/2012 FINAL     URINE MICROSCOPIC-ADD ON     Status: Abnormal   Collection Time   07/21/12 12:23 PM      Component Value Range Comment   Squamous Epithelial / LPF FEW (*) RARE    WBC, UA 0-2  <3 WBC/hpf    Bacteria, UA RARE  RARE   GLUCOSE, CAPILLARY     Status: Abnormal   Collection Time   07/21/12  9:20 PM      Component Value Range Comment   Glucose-Capillary 326 (*) 70 - 99 mg/dL   CBC     Status: Abnormal   Collection Time   07/21/12 10:36 PM      Component Value Range Comment   WBC 13.7 (*) 4.0 - 10.5 K/uL    RBC 4.40  3.87 - 5.11 MIL/uL    Hemoglobin 12.6  12.0 - 15.0 g/dL    HCT 40.9  81.1 - 91.4 %    MCV 87.3  78.0 - 100.0 fL    MCH 28.6  26.0 - 34.0 pg    MCHC 32.8  30.0 - 36.0 g/dL    RDW 78.2   95.6 - 21.3 %    Platelets 280  150 - 400 K/uL   CREATININE, SERUM     Status: Abnormal   Collection  Time   07/21/12 10:36 PM      Component Value Range Comment   Creatinine, Ser 0.91  0.50 - 1.10 mg/dL    GFR calc non Af Amer 63 (*) >90 mL/min    GFR calc Af Amer 73 (*) >90 mL/min   MAGNESIUM     Status: Normal   Collection Time   07/21/12 10:36 PM      Component Value Range Comment   Magnesium 1.8  1.5 - 2.5 mg/dL   PHOSPHORUS     Status: Normal   Collection Time   07/21/12 10:36 PM      Component Value Range Comment   Phosphorus 3.0  2.3 - 4.6 mg/dL   TSH     Status: Abnormal   Collection Time   07/21/12 10:36 PM      Component Value Range Comment   TSH 0.102 (*) 0.350 - 4.500 uIU/mL   HEMOGLOBIN A1C     Status: Abnormal   Collection Time   07/21/12 10:36 PM      Component Value Range Comment   Hemoglobin A1C 7.6 (*) <5.7 %    Mean Plasma Glucose 171 (*) <117 mg/dL   BASIC METABOLIC PANEL     Status: Abnormal   Collection Time   07/22/12  4:35 AM      Component Value Range Comment   Sodium 140  135 - 145 mEq/L    Potassium 4.5  3.5 - 5.1 mEq/L    Chloride 102  96 - 112 mEq/L    CO2 23  19 - 32 mEq/L    Glucose, Bld 282 (*) 70 - 99 mg/dL    BUN 19  6 - 23 mg/dL    Creatinine, Ser 7.82  0.50 - 1.10 mg/dL    Calcium 9.8  8.4 - 95.6 mg/dL    GFR calc non Af Amer 85 (*) >90 mL/min    GFR calc Af Amer >90  >90 mL/min   CBC     Status: Abnormal   Collection Time   07/22/12  4:35 AM      Component Value Range Comment   WBC 15.4 (*) 4.0 - 10.5 K/uL    RBC 4.37  3.87 - 5.11 MIL/uL    Hemoglobin 12.4  12.0 - 15.0 g/dL    HCT 21.3  08.6 - 57.8 %    MCV 87.2  78.0 - 100.0 fL    MCH 28.4  26.0 - 34.0 pg    MCHC 32.5  30.0 - 36.0 g/dL    RDW 46.9  62.9 - 52.8 %    Platelets 291  150 - 400 K/uL   GLUCOSE, CAPILLARY     Status: Abnormal   Collection Time   07/22/12  7:37 AM      Component Value Range Comment   Glucose-Capillary 284 (*) 70 - 99 mg/dL    Comment 1 Documented in  Chart      Comment 2 Notify RN     GLUCOSE, CAPILLARY     Status: Abnormal   Collection Time   07/22/12 12:38 PM      Component Value Range Comment   Glucose-Capillary 253 (*) 70 - 99 mg/dL    Comment 1 Documented in Chart      Comment 2 Notify RN     D-DIMER, QUANTITATIVE     Status: Normal   Collection Time   07/22/12  2:12 PM      Component Value Range Comment   D-Dimer, Sharene Butters  0.33  0.00 - 0.48 ug/mL-FEU   GLUCOSE, CAPILLARY     Status: Abnormal   Collection Time   07/22/12  4:34 PM      Component Value Range Comment   Glucose-Capillary 209 (*) 70 - 99 mg/dL    Comment 1 Documented in Chart      Comment 2 Notify RN     GLUCOSE, CAPILLARY     Status: Abnormal   Collection Time   07/22/12  9:18 PM      Component Value Range Comment   Glucose-Capillary 208 (*) 70 - 99 mg/dL      HPI :  71 year old right-handed black female, patient mine since 2007, with a history of hypertension, diabetes, and multiple sclerosis (followed by Dr. Avie Echevaria) who is admitted to the hospital yesterday because of respiratory difficulties including significant coughing and weakness.  Patient was admitted to the [facility] hospitalist service and treatment of her respiratory congestion and coughing was initiated, including antibiotic therapy. Patient has been seen in consultation by the neurology hospitalist and treatment was initiated with high-dose pulse IV Solu-Medrol 500 mg for 3 days for exacerbation of her multiple sclerosis.   #1 MS flare vs spinal stenosis  Workup has included MRI scans of the cervical, thoracic, and lumbar spine. The patient does have 2 small demyelinating plaque in the cervical spine at the C2-C3 level and dorsally at the C7 level    Neurosurgical consultation was requested because of anterolisthesis and possible spinal cord stenosis which could be contradicting to her weakness, for recommendations from a neurosurgical perspective. Hewitt Shorts, MD Saw the patient  MRI scan  of the cervical spine shows multilevel cervical spondylosis and degenerative disc disease with areas of mild and moderate stenosis, there are areas of alteration of cord signal associated with her MS, but but no cord signal changes associated with her degenerative changes in the cervical spine.  MRI scan of the thoracic spine shows multiple areas of alteration of cord signal associated with her MS, as well as progressive degenerative changes at the T11-12 level, with increased anterolisthesis T11 on 12 as compared to her July 2012 thoracic MRI and certainly as compared to her April 2007 thoracic MRI. However in April 2007 she presented with significant stenosis at T11-12 she had significant signal from within the spinal cord consistent with myelopathy, this was dramatically improved when viewing her July 2012 MRI, and now there is some faint increased signal in the spinal cord at the T11-12 level, but certainly not nearly the extent as seen 7 years ago.  MRI of the lumbar spine shows marked multilevel degenerative disc disease and spondylosis, with resulting multilevel multifactorial lumbar stenosis, most severe at L3-4, L4-5, and L5-S1, but also with degenerative changes seen as well at L1-2 and L2-3.   Patient describes her history regarding her lower extremities as if they "felt like they were and nail down to the floor". She explained that she been having the symptoms for about a week. But also she been having difficulties with coughing significantly and she feels this contributed to pain across her low back. She explained that she was having difficulty with her mobility. She's been using a rolling walker for the past 7 years, was having increased difficulty with getting around with the rolling walker. She denies any bowel or bladder incontinence, denies any urinary retention, and denies any constipation.  Patient notes that she has had weakness since her MS was diagnosed over 7 years ago, and that  she had  bilateral foot drops.  Past surgical history is notable for her T11-12 laminectomy that I performed in May of 2007,   Dr.NUDELMAN, has requested CT scan of the lower thoracic and upper lumbar spine Images are pending at this time  Neurology recommends that if there is a suspicion that the primary problem is an MS flare, and not a neurosurgical issue, then postcontrast MRI images may be obtained today. We'll defer this to neurosurgery   #2 acute bronchitis probably contributing to the patient's shortness of breath D-dimer negative Being treated for early community-acquired pneumonia/bronchitis Continue levofloxacin for another 7 days including nebulizer treatments  #3 rehabilitation Patient to be discharged to CIR today  #4 diabetes mellitus hemoglobin A1c of 7.6. Continue Amaryl and metformin Was treated with Lantus and sliding scale insulin during this hospitalization  #5 hypertension stable    Discharge Exam:  Blood pressure 140/71, pulse 82, temperature 98.5 F (36.9 C), temperature source Oral, resp. rate 18, height 5\' 2"  (1.575 m), weight 91.2 kg (201 lb 1 oz), SpO2 96.00%.   General: Afebrile, mild shortness of breath when trying to speak in full sentences; no acute distress  Eyes: PERRLA, no nystagmus, no icterus, extraocular muscles intact.  ENT: No erythema, no exudates, good dentition, moist mucous membranes, no drainage out of her ears or nostrils.  Neck: Supple, no thyromegaly.  Cardiovascular: Tachycardia, no rubs, no gallops, no murmurs, S1 and S2 appreciated on exam.  Respiratory: Expiratory wheezing, diffuse rhonchi, decreased air movement, no crackles.  Abdomen: Obese, soft, nontender, positive bowel sounds, nondistended  Skin: No rash, no petechiae.  Musculoskeletal: scars from previous knee surgeries, but no acute joint swelling or erythema on exam.  Psychiatric: Stable, no hallucinations, no suicidal ideation.  Neurologic: Decrease in strength of her lower  extremities       Signed: Richarda Overlie 07/23/2012, 7:44 AM

## 2012-07-23 NOTE — Progress Notes (Signed)
Physical Therapy Treatment Patient Details Name: Laura Roberts MRN: [id number] DOB: 05-Dec-1940 Today's Date: 07/23/2012 Time: 4098-1191 PT Time Calculation (min): 32 min  PT Assessment / Plan / Recommendation Comments on Treatment Session  Pt admitted with MS flare and SOB with cervical and thoracic spondylosis. Pt able to progress mobility with ambulation today and educated for HEP as well as use of sheet to assist lifting legs and pt able to demonstrate end of session. Pt states her leg weakness is typical for her MS flares but feels she is getting better. Continue to recommend OOB daily with nursing and CIR. Will follow.     Follow Up Recommendations        Does the patient have the potential to tolerate intense rehabilitation     Barriers to Discharge        Equipment Recommendations       Recommendations for Other Services    Frequency     Plan Discharge plan remains appropriate;Frequency remains appropriate    Precautions / Restrictions Precautions Precautions: Fall Restrictions Weight Bearing Restrictions: No   Pertinent Vitals/Pain No pain    Mobility  Bed Mobility Bed Mobility: Rolling Left;Left Sidelying to Sit;Sitting - Scoot to Edge of Bed Rolling Left: 5: Supervision;With rail Left Sidelying to Sit: 5: Supervision;HOB elevated;With rails (HOB 20degrees) Sitting - Scoot to Edge of Bed: 4: Min guard Details for Bed Mobility Assistance: pt with increased time, momentum and use of rail to roll to side and bring legs to EOB and transition to sitting. Cueing for scooting to EOB. Pt able to scoot back in chair boosting self with armrests without assist Transfers Sit to Stand: 1: +2 Total assist;From bed Sit to Stand: Patient Percentage: 70% Stand to Sit: 1: +2 Total assist;To chair/3-in-1 Stand to Sit: Patient Percentage: 50% Details for Transfer Assistance: cueing for hand placement, scooting to edge of surface and hand over hand assist to reach armrest and  control descent to surface Ambulation/Gait Ambulation/Gait Assistance: 1: +2 Total assist Ambulation/Gait: Patient Percentage: 70% Ambulation Distance (Feet): 8 Feet Assistive device: Rolling walker Ambulation/Gait Assistance Details: cueing for sequence with assist to advance RW, weight shift and chair pulled behind as pt states bil knees buckle with 3 falls this year. pt with RLE crossing over LLE x 2 Gait Pattern: Scissoring;Shuffle;Decreased weight shift to left;Decreased weight shift to right Gait velocity: decreased    Exercises General Exercises - Lower Extremity Short Arc QuadBarbaraann Boys;Right;20 reps;Seated Long Arc Quad: AROM;Left;20 reps;Seated Hip ABduction/ADduction: AAROM;Both;20 reps;Seated Hip Flexion/Marching: AAROM;20 reps;Seated;Both   PT Diagnosis:    PT Problem List:   PT Treatment Interventions:     PT Goals Acute Rehab PT Goals PT Goal: Rolling Supine to Left Side - Progress: Progressing toward goal PT Goal: Supine/Side to Sit - Progress: Progressing toward goal PT Goal: Sit to Stand - Progress: Progressing toward goal PT Goal: Stand to Sit - Progress: Progressing toward goal PT Goal: Ambulate - Progress: Progressing toward goal  Visit Information  Last PT Received On: 07/23/12 Assistance Needed: +2 (for ambulation)    Subjective Data  Subjective: "I couldn't move my legs when I came in"   Cognition  Overall Cognitive Status: Appears within functional limits for tasks assessed/performed Arousal/Alertness: Awake/alert Orientation Level: Appears intact for tasks assessed Behavior During Session: [facility] for tasks performed    Balance     End of Session PT - End of Session Equipment Utilized During Treatment: Gait belt Activity Tolerance: Patient tolerated treatment well Patient left: in  chair;with call bell/phone within reach Nurse Communication: Mobility status   GP     Toney Sang [facility] 07/23/2012, 11:08 AM Delaney Meigs, PT [phone]

## 2012-07-23 NOTE — Evaluation (Signed)
Occupational Therapy Evaluation Patient Details Name: Laura Roberts MRN: [id number] DOB: May 19, 1941 Today's Date: 07/23/2012 Time: 1478-2956 OT Time Calculation (min): 19 min  OT Assessment / Plan / Recommendation Clinical Impression  This 71 yo admitted with shortness of breath, cough (productive), increased weakness/paraplegia (? MS exacerbation) presents to acute OT with problems below. Will benefit from acute OT with follow up on CIR.    OT Assessment  Patient needs continued OT Services    Follow Up Recommendations  CIR    Barriers to Discharge None    Equipment Recommendations  None recommended by OT       Frequency  Min 2X/week    Precautions / Restrictions Precautions Precautions: Fall Restrictions Weight Bearing Restrictions: No   Pertinent Vitals/Pain Mild constant back pain per patient    ADL  Eating/Feeding: Simulated;Independent Where Assessed - Eating/Feeding: Edge of bed Grooming: Performed;Brushing hair;Set up Where Assessed - Grooming: Unsupported sitting Upper Body Bathing: Simulated;Set up Where Assessed - Upper Body Bathing: Unsupported sitting Lower Body Bathing: Simulated;Moderate assistance Where Assessed - Lower Body Bathing: Supported sit to stand Upper Body Dressing: Simulated;Set up Where Assessed - Upper Body Dressing: Unsupported sitting Lower Body Dressing: Simulated;Moderate assistance Where Assessed - Lower Body Dressing: Supported sit to stand Toilet Transfer: Chief of Staff: Patient Percentage: 70% Statistician Method: Sit to Barista:  (Sit to stand from raised bed, several steps with chair behin) Toileting - Clothing Manipulation and Hygiene: Simulated;Moderate assistance Where Assessed - Toileting Clothing Manipulation and Hygiene: Standing Transfers/Ambulation Related to ADLs: total A +2 (pt=70% sit to stand from raised surface), total A +2 (pt=-50% stand to sit with  decreased control for descent); total A +2 (pt=60%) ambulation with A for walker advancement at times, A for shifting weight at times, and/or A for advancing Rt and/or Lt LE(s) at times. ADL Comments: Brings one leg up and crosses over the other for socks and shoes    OT Diagnosis: Generalized weakness  OT Problem List: Decreased strength;Impaired balance (sitting and/or standing);Decreased activity tolerance;Decreased knowledge of use of DME or AE OT Treatment Interventions: Self-care/ADL training;Therapeutic exercise;Therapeutic activities;DME and/or AE instruction;Patient/family education;Balance training   OT Goals Acute Rehab OT Goals OT Goal Formulation: With patient Time For Goal Achievement: 08/06/12 Potential to Achieve Goals: Good ADL Goals Pt Will Perform Grooming: Unsupported;Standing at sink (minguard A, 2 tasks) ADL Goal: Grooming - Progress: Goal set today Pt Will Transfer to Toilet: with min assist;Ambulation;with DME;Regular height toilet;Comfort height toilet;3-in-1;Grab bars ADL Goal: Toilet Transfer - Progress: Goal set today Pt Will Perform Toileting - Clothing Manipulation: Standing (minguard A) ADL Goal: Toileting - Clothing Manipulation - Progress: Goal set today Pt Will Perform Toileting - Hygiene: Sit to stand from 3-in-1/toilet (minguard A) ADL Goal: Toileting - Hygiene - Progress: Goal set today Miscellaneous OT Goals Miscellaneous OT Goal #1: Pt will be able to come up to sit EOB with Mod I for BADLs. OT Goal: Miscellaneous Goal #1 - Progress: Goal set today Miscellaneous OT Goal #2: Pt will be Independent with Level 2 theraband to maintain strength in Bil UEs. OT Goal: Miscellaneous Goal #2 - Progress: Goal set today Miscellaneous OT Goal #3: Pt will be able to go from sit to stand with min guard A from all surfaces for BADLs. OT Goal: Miscellaneous Goal #3 - Progress: Goal set today  Visit Information  Last OT Received On: 07/23/12 Assistance Needed: +2  (for ambulation) PT/OT Co-Evaluation/Treatment: Yes (partial)    Subjective Data  Subjective: I bring my leg up and cross it over the other one to get my socks on and off   Prior Functioning     Home Living Lives With: Alone Available Help at Discharge: Friend(s);Available PRN/intermittently Home Access: Level entry Home Layout: One level Bathroom Shower/Tub: Tub/shower unit;Curtain Firefighter: Standard Bathroom Accessibility: Yes How Accessible: Accessible via walker Home Adaptive Equipment: Bedside commode/3-in-1;Hand-held shower hose;Tub transfer bench;Walker - four wheeled (3 wheeled (triangular) walker) Additional Comments: Has been managing independently since being in rehab in 2007 Prior Function Level of Independence: Independent with assistive device(s) Able to Take Stairs?: Yes Driving: Yes (rarely) Vocation: Retired Musician: No difficulties Dominant Hand: Right         Vision/Perception  wears glasses for reading. Has mild cataracts Bil per pt   Cognition  Overall Cognitive Status: Appears within functional limits for tasks assessed/performed Arousal/Alertness: Awake/alert Orientation Level: Appears intact for tasks assessed Behavior During Session: [facility] for tasks performed    Extremity/Trunk Assessment Right Upper Extremity Assessment RUE ROM/Strength/Tone: Within functional levels (enough to do a chair push up and scoot back in recliner) Left Upper Extremity Assessment LUE ROM/Strength/Tone: Within functional levels (enough to do a chair push up and scoot back in recliner)     Mobility Bed Mobility Bed Mobility: Rolling Left;Left Sidelying to Sit;Sitting - Scoot to Edge of Bed Rolling Left: 4: Min guard Left Sidelying to Sit: 4: Min guard Sitting - Scoot to [place] of Bed: 4: Min guard Transfers Transfers: Sit to Stand;Stand to Sit Sit to Stand: 1: +2 Total assist;From elevated surface;With upper extremity assist;From bed Sit  to Stand: Patient Percentage: 70% Stand to Sit: 1: +2 Total assist;With upper extremity assist;With armrests;To chair/3-in-1 Stand to Sit: Patient Percentage: 50% (decreased control for descent) Details for Transfer Assistance: VCs for safe hand placement              End of Session OT - End of Session Equipment Utilized During Treatment: Gait belt (RW) Activity Tolerance: Patient tolerated treatment well Patient left: in chair;with call bell/phone within reach       Evette Georges [phone] 07/23/2012, 10:36 AM

## 2012-07-23 NOTE — Progress Notes (Signed)
Subjective: Patient continues to have moderate cough, she says it is causing her to have some pain in her lower thoracic spine. Patient's been seen by PM[facility] who is planning on transferring her to the rehabilitation center for CIR.  Patient underwent a thoracolumbar MRI last night. Study reconfirms the advanced degenerative disc disease and spondylosis at the T11-12 level with an associated anterolisthesis (contributed to by her previous decompressive laminectomy).  Objective: Vital signs in last 24 hours: Filed Vitals:   07/22/12 1800 07/22/12 2119 07/23/12 0555 07/23/12 0942  BP: 118/79 146/79 140/71 144/78  Pulse: 81 88 82 84  Temp: 97.9 F (36.6 C) 98.3 F (36.8 C) 98.5 F (36.9 C)   TempSrc: Oral Oral Oral   Resp: 18 18 18 20   Height:  5\' 2"  (1.575 m)    Weight:  91.2 kg (201 lb 1 oz)    SpO2:  100% 96% 100%    Intake/Output from previous day: 01/22 0701 - 01/23 0700 In: 1755.8 [P.O.:1200; I.V.:555.8] Out: 651 [Urine:650; Stool:1] Intake/Output this shift: Total I/O In: 490 [P.O.:240; I.V.:250] Out: -   Physical Exam:  Motor examination shows 5 over 5 strength in the left iliopsoas, quadriceps, dorsiflexor, EHL, and plantar flexor. However on the right side the iliopsoas is 2/5, the quadriceps is 3-4 minus over 5, the dorsiflexor and plantar flexor are 4 over 5.  CBC  Basename 07/23/12 0940 07/22/12 0435  WBC 23.1* 15.4*  HGB 13.3 12.4  HCT 39.3 38.1  PLT 293 291   BMET  Basename 07/22/12 0435 07/21/12 2236 07/21/12 1132  NA 140 -- 140  K 4.5 -- 4.4  CL 102 -- 100  CO2 23 -- 26  GLUCOSE 282* -- 147*  BUN 19 -- 16  CREATININE 0.74 0.91 --  CALCIUM 9.8 -- 9.7     Studies/Results: Ct Lumbar Spine Wo Contrast  07/23/2012  *RADIOLOGY REPORT*  Clinical Data: Evaluate spondylolisthesis and stenosis at the T11- 12 level. Back pain with bilateral leg weakness.  CT THORACIC SPINE WITHOUT CONTRAST,CT LUMBAR SPINE WITHOUT CONTRAST  Technique:  Multidetector CT imaging  of the thoracic and lumbar spine was performed without intravenous contrast.  Comparison: MRI thoracic spine 07/22/2012. Prior MRI from 01/03/2011.  Findings: The scan extends from T6-7 through the mid sacrum.  At the thoracic levels from T7-8 through T10-11, there is mild facet arthropathy and slight vertebral body spurring, but no visible compressive lesion.  There is 3 mm of anterolisthesis T11-12 with marked vacuum phenomenon. The endplates show marked subchondral cystic formation and sclerosis.  Marked extraforaminal spurring is greater on the right.  There is advanced facet arthropathy of the T11-T12 facet complex with joint space narrowing and subchondral cyst formation. Canal diameter estimated be 4 mm as measured on image 46 series 603.  Previous central laminectomy at this level performed in 2007. Moderate cord compression is better demonstrated on MR, as the central disc protrusion is poorly visualized.  At T12-L1, there is vacuum disc phenomenon with mild bulge.  Wide central laminectomies are present without significant facet arthropathy.  At L1-2, the disc space is significantly narrowed there is moderate osteophytic spurring on the left with facet arthropathy but no definite intraspinal lesion.  Advanced vacuum phenomenon is noted at L2-3, L3-4, and L4-5 with osteophytic spurring in potentially symptomatic left sided nerve root encroachment particularly at L2-3  At L3-4, there is moderate to severe stenosis related to disc osteophyte complex greater on the left, and advanced facet arthropathy.  Marked vacuum disc  phenomenon.  Left greater than right L4 and L3 nerve root encroachment.  At L4-L5 there is moderate multifactorial spinal stenosis related to central protrusion and posterior element hypertrophy.  At L5-S1, there is a central protrusion with advanced facet and ligamentum flavum hypertrophy.  Moderate to severe stenosis is present at this level.  Bilateral neural foraminal narrowing is also  appreciated.  Compared with prior MRI from 2012, there is increased anterolisthesis and cord compression.  IMPRESSION:  3 mm anterolisthesis T11-12 with marked vacuum joint phenomenon. Advanced bony overgrowth including facet degenerative changes. Prior central laminectomy.  Central protrusion T11-12  better demonstrated on MR.  Advanced facet arthropathy at this level contributes to spinal stenosis, estimated to be 4 mm.  Multilevel lumbar spondylosis as described.   Original Report Authenticated By: Davonna Belling, M.D.    Mr Cervical Spine Wo Contrast  07/22/2012  *RADIOLOGY REPORT*  Clinical Data:  72 year old female with history of multiple sclerosis.  Recent shortness of breath and increased weakness. Possible MS FLAIR. Prior spine surgery.  New onset paraplegia and lumbar pain.  Comparison: [facility] lumbar radiographs 01/07/2011.  Thoracic and lumbar MRI 01/03/2011. Cervical MRI 10/19/2005.  MRI CERVICAL SPINE WITHOUT CONTRAST  Technique:  Multiplanar and multiecho pulse sequences of the cervic al spine, to include the craniocervical junction and cervicothoraci c junction, were obtained according to standard protocol without intravenous contrast.  Findings:  Chronic thyromegaly, especially the left lobe.  Suspect multiple small thyroid nodules.  Benign multinodular goiter is favored.  Other Visualized paraspinal soft tissues are within normal limits.  Chronic straightening of cervical lordosis.  Mild anterolisthesis of C4 on C5 is increased along with posterior element degeneration at that level, see below. No marrow edema or evidence of acute osseous abnormality.  Cervicomedullary junction is within normal limits.  Visualized brain stem and cerebellum within normal limits.  Evidence of a small probable demyelinating plaque in the dorsal upper cervical cord at C2-C3 (series 24 image 8, series 26 image 5) was not evident on the 2007 study. Tiny chronic left dorsal cord lesion at C7  (series 26 image 19) appears to be stable.  No other definite spinal cord signal abnormality.  C2-C3:  Facet and ligament flavum hypertrophy is increased.  No spinal stenosis.  Mild bilateral C3 foraminal stenosis has mildly increased.  C3-C4:  Progressive disc space loss and endplate degeneration. Less pronounced central disc protrusion.  No significant spinal stenosis.  Moderate bilateral C4 foraminal stenosis has increased.  C4-C5:  No spondylolisthesis.  Progressive disc and endplate degeneration.  New moderate to severe ligament flavum hypertrophy. Chronic moderate facet hypertrophy not significantly changed.  New spinal stenosis with minimal to mild spinal cord flattening. Severe right and moderate left C5 foraminal stenosis has progressed.  C5-C6:  Chronic disc and endplate degeneration has not significantly changed.  Moderate facet hypertrophy has not significantly changed.  Chronic borderline to mild spinal stenosis is stable.  No spinal cord mass effect.  Severe left and moderate to severe right C6 foraminal stenosis at mildly progressed.  C6-C7:  Chronic disc and endplate degeneration.  Stable broad-based central disc component.  Mild to moderate ligament flavum hypertrophy is not significantly changed.  Mild spinal stenosis with minimal to mild spinal cord mass effect has not significantly changed.  Severe left and moderate right C7 foraminal stenosis has not significantly changed.  C7-T1:  Mild anterolisthesis is stable.  Bulky circumferential disc osteophyte complex and facet hypertrophy has not significantly changed.  Stable  mild spinal stenosis.  Stable severe bilateral C8 foraminal stenosis.  Thoracic spine findings are below.  IMPRESSION: 1.  Multilevel multifactorial cervical spine degeneration has progressed since 2007.  New spinal stenosis at C4-C5.  Mild intermittent cervical spinal cord mass effect with no compression related cervical spinal cord signal abnormality. 2.  Two small probable  demyelinating plaques in the cervical spinal cord dorsally at C2-C3 (not evident in 2007) and dorsally at C7 (stable). 3.  See thoracic and lumbar findings below. 4.  Chronic multinodular goiter.  MRI THORACIC SPINE WITHOUT CONTRAST  Technique: Multiplanar and multiecho pulse sequences of the thoracic spine were obtained without intravenous contrast.  Findings:  Upper thoracic vertebral height and alignment is stable. No upper thoracic marrow edema.  Remote posterior decompression at T11-T12 and T12-L1. Stable posterior paraspinal soft tissues.  Stable visualized thoracic and upper abdominal viscera.  Evidence of predominately dorsal spinal cord signal abnormality at T4-T5 (series 11 image 8), and centered at T6 (image 7).  The former lesion appears to be new or increased.  The latter lesion appears to be chronic but increased.  Anterolisthesis of T11 on T12 is chronic but appears increased since 2012 and is associated with worsening multifactorial spinal stenosis at that level, see below.  There is evidence of associated mild spinal cord signal abnormality (series 9 image 7). Furthermore, most apparent on axial T2-weighted images there is chronic central cord signal abnormality occurring below the T7 level and extending to the stenotic T11-T12 level.  This is not significantly changed.  T1-T2: Stable mild facet hypertrophy.  T2-T3: Negative.  T3-T4: Severe facet degeneration on the right has progressed.  No significant spinal stenosis.  Right T3 foraminal stenosis is mildly progressed.  T4-T5: Facet degeneration on the right has progressed.  Right T4 foraminal stenosis has not significantly changed.  T5-T6: Facet hypertrophy greater on the right is not significantly changed.  T6-T7: Mild circumferential disc bulges stable.  Moderate facet and ligament flavum hypertrophy is stable.  Stable foraminal stenosis with no significant spinal stenosis.  T7-T8: Mild to moderate circumferential disc bulge appears mildly  increased.  Moderate facet hypertrophy is increased.  Mild spinal stenosis is stable or mildly increased.  No significant spinal cord mass effect.  Bilateral foraminal stenosis is not significantly changed.  T8-T9: Circumferential disc bulge is stable.  Moderate facet hypertrophy is mildly increased.  No significant spinal stenosis.  T9-T10: Chronic circumferential disc bulges stable to mildly progressed.  Moderate facet hypertrophy is mildly progressed.  Mild spinal stenosis.  No significant spinal cord mass effect.  T10-T11: Moderate to severe facet hypertrophy is mildly progressed. Mild spinal stenosis has mildly increased.  T11-T12: Anterolisthesis appears increased along with disc osteophyte complex and residual posterior element hypertrophy. Spinal stenosis now is severe (series 12 image 36) with associated cord compression.  Bilateral foraminal stenosis is moderate to severe and only mildly increased.  T12-L1:  Stable with no significant spinal or foraminal stenosis. No definite spinal cord signal abnormality at this level.  The conus medullaris occurs at L1-L2, see below.  IMPRESSION: 1.  Progression of anterolisthesis at T11-T12 following remote posterior decompression of this level.  Multifactorial spinal stenosis here has increased and is now severe (series 12 image 36). There is associated increased cord compression.  There is mild cord signal abnormality at this level, unclear if this is stable or increased.  See #2. 2.  Chronic central cord signal abnormality from T7-T8 to the stenotic T11-T12 level appears not significantly changed. Superimposed  probable demyelinating plaques at T4-T5 and centered at T6 are chronic but increased since 2012. 3.  Mild progression of mostly posterior element degeneration in the upper thoracic spine.  Other levels of mild thoracic spinal stenosis have not significantly changed. 4.  See lumbar findings below.  MRI LUMBAR SPINE WITHOUT CONTRAST  Technique: Multiplanar and  multiecho pulse sequences of the lumbar spine were obtained without intravenous contrast.  Findings:  Stable visualized abdominal viscera.  There is moderate to severe bladder distention which is new.  Visualized paraspinal soft tissues are within normal limits.  Lumbar vertebral height and alignment has not significantly changed. No marrow edema or evidence of acute osseous abnormality.  L1-L2:  Chronic spinal stenosis with indistinctness of the conus medullaris at this level, favor related to chronic redundancy of the spinal nerve roots occupying the ventral CSF space.  The appearance is not significantly changed.  Disc bulge, facet and ligament flavum hypertrophy here is stable.  L2-L3:  Chronic spinal stenosis related to right eccentric circumferential disc bulge and moderate facet and ligament flavum hypertrophy is stable and moderate.  L3-L4:  Chronic spinal stenosis related to bulky circumferential disc osteophyte complex, severe facet hypertrophy, and severe ligament flavum hypertrophy, is severe and stable to mildly progressed.  L4-L5:  Chronic spinal stenosis related to bulky right eccentric circumferential disc osteophyte complex and severe facet and severe ligament flavum hypertrophy is severe and not significantly changed.  Trace fluid in the facet joints greater on the left is stable.  Moderate to severe right L4 foraminal stenosis is stable.  L5-S1:  Chronic spinal stenosis at this level appears progressed and is related to mild right eccentric circumferential disc bulge plus severe facet and ligament flavum hypertrophy.  Spinal stenosis now is moderate to severe, previously mild to moderate.  Lateral recess stenosis now is moderate.  Mild left and moderate to severe right L5 foraminal stenosis has not significantly changed.  IMPRESSION: 1.  Diffuse chronic lumbar spinal stenosis has mildly progressed at L3-L4 and L5-S1 as detailed above.  Spinal stenosis is severe at L3- L4, L4-L5, and L5-S1. 2.   Moderate to severe distention of the bladder.  3. Chronic indistinctness of the conus medullaris felt related to compressed redundant nerve roots in the setting of diffuse lumbar spinal stenosis. 4.  No acute osseous abnormality in the lumbar spine.   Original Report Authenticated By: Erskine Speed, M.D.    Mr Thoracic Spine Wo Contrast  07/22/2012  *RADIOLOGY REPORT*  Clinical Data:  72 year old female with history of multiple sclerosis.  Recent shortness of breath and increased weakness. Possible MS FLAIR. Prior spine surgery.  New onset paraplegia and lumbar pain.  Comparison: [facility] lumbar radiographs 01/07/2011.  Thoracic and lumbar MRI 01/03/2011. Cervical MRI 10/19/2005.  MRI CERVICAL SPINE WITHOUT CONTRAST  Technique:  Multiplanar and multiecho pulse sequences of the cervic al spine, to include the craniocervical junction and cervicothoraci c junction, were obtained according to standard protocol without intravenous contrast.  Findings:  Chronic thyromegaly, especially the left lobe.  Suspect multiple small thyroid nodules.  Benign multinodular goiter is favored.  Other Visualized paraspinal soft tissues are within normal limits.  Chronic straightening of cervical lordosis.  Mild anterolisthesis of C4 on C5 is increased along with posterior element degeneration at that level, see below. No marrow edema or evidence of acute osseous abnormality.  Cervicomedullary junction is within normal limits.  Visualized brain stem and cerebellum within normal limits.  Evidence of a small  probable demyelinating plaque in the dorsal upper cervical cord at C2-C3 (series 24 image 8, series 26 image 5) was not evident on the 2007 study. Tiny chronic left dorsal cord lesion at C7 (series 26 image 19) appears to be stable.  No other definite spinal cord signal abnormality.  C2-C3:  Facet and ligament flavum hypertrophy is increased.  No spinal stenosis.  Mild bilateral C3 foraminal stenosis has  mildly increased.  C3-C4:  Progressive disc space loss and endplate degeneration. Less pronounced central disc protrusion.  No significant spinal stenosis.  Moderate bilateral C4 foraminal stenosis has increased.  C4-C5:  No spondylolisthesis.  Progressive disc and endplate degeneration.  New moderate to severe ligament flavum hypertrophy. Chronic moderate facet hypertrophy not significantly changed.  New spinal stenosis with minimal to mild spinal cord flattening. Severe right and moderate left C5 foraminal stenosis has progressed.  C5-C6:  Chronic disc and endplate degeneration has not significantly changed.  Moderate facet hypertrophy has not significantly changed.  Chronic borderline to mild spinal stenosis is stable.  No spinal cord mass effect.  Severe left and moderate to severe right C6 foraminal stenosis at mildly progressed.  C6-C7:  Chronic disc and endplate degeneration.  Stable broad-based central disc component.  Mild to moderate ligament flavum hypertrophy is not significantly changed.  Mild spinal stenosis with minimal to mild spinal cord mass effect has not significantly changed.  Severe left and moderate right C7 foraminal stenosis has not significantly changed.  C7-T1:  Mild anterolisthesis is stable.  Bulky circumferential disc osteophyte complex and facet hypertrophy has not significantly changed.  Stable mild spinal stenosis.  Stable severe bilateral C8 foraminal stenosis.  Thoracic spine findings are below.  IMPRESSION: 1.  Multilevel multifactorial cervical spine degeneration has progressed since 2007.  New spinal stenosis at C4-C5.  Mild intermittent cervical spinal cord mass effect with no compression related cervical spinal cord signal abnormality. 2.  Two small probable demyelinating plaques in the cervical spinal cord dorsally at C2-C3 (not evident in 2007) and dorsally at C7 (stable). 3.  See thoracic and lumbar findings below. 4.  Chronic multinodular goiter.  MRI THORACIC SPINE  WITHOUT CONTRAST  Technique: Multiplanar and multiecho pulse sequences of the thoracic spine were obtained without intravenous contrast.  Findings:  Upper thoracic vertebral height and alignment is stable. No upper thoracic marrow edema.  Remote posterior decompression at T11-T12 and T12-L1. Stable posterior paraspinal soft tissues.  Stable visualized thoracic and upper abdominal viscera.  Evidence of predominately dorsal spinal cord signal abnormality at T4-T5 (series 11 image 8), and centered at T6 (image 7).  The former lesion appears to be new or increased.  The latter lesion appears to be chronic but increased.  Anterolisthesis of T11 on T12 is chronic but appears increased since 2012 and is associated with worsening multifactorial spinal stenosis at that level, see below.  There is evidence of associated mild spinal cord signal abnormality (series 9 image 7). Furthermore, most apparent on axial T2-weighted images there is chronic central cord signal abnormality occurring below the T7 level and extending to the stenotic T11-T12 level.  This is not significantly changed.  T1-T2: Stable mild facet hypertrophy.  T2-T3: Negative.  T3-T4: Severe facet degeneration on the right has progressed.  No significant spinal stenosis.  Right T3 foraminal stenosis is mildly progressed.  T4-T5: Facet degeneration on the right has progressed.  Right T4 foraminal stenosis has not significantly changed.  T5-T6: Facet hypertrophy greater on the right is not significantly changed.  T6-T7: Mild circumferential disc bulges stable.  Moderate facet and ligament flavum hypertrophy is stable.  Stable foraminal stenosis with no significant spinal stenosis.  T7-T8: Mild to moderate circumferential disc bulge appears mildly increased.  Moderate facet hypertrophy is increased.  Mild spinal stenosis is stable or mildly increased.  No significant spinal cord mass effect.  Bilateral foraminal stenosis is not significantly changed.  T8-T9:  Circumferential disc bulge is stable.  Moderate facet hypertrophy is mildly increased.  No significant spinal stenosis.  T9-T10: Chronic circumferential disc bulges stable to mildly progressed.  Moderate facet hypertrophy is mildly progressed.  Mild spinal stenosis.  No significant spinal cord mass effect.  T10-T11: Moderate to severe facet hypertrophy is mildly progressed. Mild spinal stenosis has mildly increased.  T11-T12: Anterolisthesis appears increased along with disc osteophyte complex and residual posterior element hypertrophy. Spinal stenosis now is severe (series 12 image 36) with associated cord compression.  Bilateral foraminal stenosis is moderate to severe and only mildly increased.  T12-L1:  Stable with no significant spinal or foraminal stenosis. No definite spinal cord signal abnormality at this level.  The conus medullaris occurs at L1-L2, see below.  IMPRESSION: 1.  Progression of anterolisthesis at T11-T12 following remote posterior decompression of this level.  Multifactorial spinal stenosis here has increased and is now severe (series 12 image 36). There is associated increased cord compression.  There is mild cord signal abnormality at this level, unclear if this is stable or increased.  See #2. 2.  Chronic central cord signal abnormality from T7-T8 to the stenotic T11-T12 level appears not significantly changed. Superimposed probable demyelinating plaques at T4-T5 and centered at T6 are chronic but increased since 2012. 3.  Mild progression of mostly posterior element degeneration in the upper thoracic spine.  Other levels of mild thoracic spinal stenosis have not significantly changed. 4.  See lumbar findings below.  MRI LUMBAR SPINE WITHOUT CONTRAST  Technique: Multiplanar and multiecho pulse sequences of the lumbar spine were obtained without intravenous contrast.  Findings:  Stable visualized abdominal viscera.  There is moderate to severe bladder distention which is new.  Visualized  paraspinal soft tissues are within normal limits.  Lumbar vertebral height and alignment has not significantly changed. No marrow edema or evidence of acute osseous abnormality.  L1-L2:  Chronic spinal stenosis with indistinctness of the conus medullaris at this level, favor related to chronic redundancy of the spinal nerve roots occupying the ventral CSF space.  The appearance is not significantly changed.  Disc bulge, facet and ligament flavum hypertrophy here is stable.  L2-L3:  Chronic spinal stenosis related to right eccentric circumferential disc bulge and moderate facet and ligament flavum hypertrophy is stable and moderate.  L3-L4:  Chronic spinal stenosis related to bulky circumferential disc osteophyte complex, severe facet hypertrophy, and severe ligament flavum hypertrophy, is severe and stable to mildly progressed.  L4-L5:  Chronic spinal stenosis related to bulky right eccentric circumferential disc osteophyte complex and severe facet and severe ligament flavum hypertrophy is severe and not significantly changed.  Trace fluid in the facet joints greater on the left is stable.  Moderate to severe right L4 foraminal stenosis is stable.  L5-S1:  Chronic spinal stenosis at this level appears progressed and is related to mild right eccentric circumferential disc bulge plus severe facet and ligament flavum hypertrophy.  Spinal stenosis now is moderate to severe, previously mild to moderate.  Lateral recess stenosis now is moderate.  Mild left and moderate to severe right L5 foraminal stenosis  has not significantly changed.  IMPRESSION: 1.  Diffuse chronic lumbar spinal stenosis has mildly progressed at L3-L4 and L5-S1 as detailed above.  Spinal stenosis is severe at L3- L4, L4-L5, and L5-S1. 2.  Moderate to severe distention of the bladder.  3. Chronic indistinctness of the conus medullaris felt related to compressed redundant nerve roots in the setting of diffuse lumbar spinal stenosis. 4.  No acute osseous  abnormality in the lumbar spine.   Original Report Authenticated By: Erskine Speed, M.D.    Mr Lumbar Spine Wo Contrast  07/22/2012  *RADIOLOGY REPORT*  Clinical Data:  72 year old female with history of multiple sclerosis.  Recent shortness of breath and increased weakness. Possible MS FLAIR. Prior spine surgery.  New onset paraplegia and lumbar pain.  Comparison: [facility] lumbar radiographs 01/07/2011.  Thoracic and lumbar MRI 01/03/2011. Cervical MRI 10/19/2005.  MRI CERVICAL SPINE WITHOUT CONTRAST  Technique:  Multiplanar and multiecho pulse sequences of the cervic al spine, to include the craniocervical junction and cervicothoraci c junction, were obtained according to standard protocol without intravenous contrast.  Findings:  Chronic thyromegaly, especially the left lobe.  Suspect multiple small thyroid nodules.  Benign multinodular goiter is favored.  Other Visualized paraspinal soft tissues are within normal limits.  Chronic straightening of cervical lordosis.  Mild anterolisthesis of C4 on C5 is increased along with posterior element degeneration at that level, see below. No marrow edema or evidence of acute osseous abnormality.  Cervicomedullary junction is within normal limits.  Visualized brain stem and cerebellum within normal limits.  Evidence of a small probable demyelinating plaque in the dorsal upper cervical cord at C2-C3 (series 24 image 8, series 26 image 5) was not evident on the 2007 study. Tiny chronic left dorsal cord lesion at C7 (series 26 image 19) appears to be stable.  No other definite spinal cord signal abnormality.  C2-C3:  Facet and ligament flavum hypertrophy is increased.  No spinal stenosis.  Mild bilateral C3 foraminal stenosis has mildly increased.  C3-C4:  Progressive disc space loss and endplate degeneration. Less pronounced central disc protrusion.  No significant spinal stenosis.  Moderate bilateral C4 foraminal stenosis has increased.  C4-C5:   No spondylolisthesis.  Progressive disc and endplate degeneration.  New moderate to severe ligament flavum hypertrophy. Chronic moderate facet hypertrophy not significantly changed.  New spinal stenosis with minimal to mild spinal cord flattening. Severe right and moderate left C5 foraminal stenosis has progressed.  C5-C6:  Chronic disc and endplate degeneration has not significantly changed.  Moderate facet hypertrophy has not significantly changed.  Chronic borderline to mild spinal stenosis is stable.  No spinal cord mass effect.  Severe left and moderate to severe right C6 foraminal stenosis at mildly progressed.  C6-C7:  Chronic disc and endplate degeneration.  Stable broad-based central disc component.  Mild to moderate ligament flavum hypertrophy is not significantly changed.  Mild spinal stenosis with minimal to mild spinal cord mass effect has not significantly changed.  Severe left and moderate right C7 foraminal stenosis has not significantly changed.  C7-T1:  Mild anterolisthesis is stable.  Bulky circumferential disc osteophyte complex and facet hypertrophy has not significantly changed.  Stable mild spinal stenosis.  Stable severe bilateral C8 foraminal stenosis.  Thoracic spine findings are below.  IMPRESSION: 1.  Multilevel multifactorial cervical spine degeneration has progressed since 2007.  New spinal stenosis at C4-C5.  Mild intermittent cervical spinal cord mass effect with no compression related cervical spinal cord signal abnormality.  2.  Two small probable demyelinating plaques in the cervical spinal cord dorsally at C2-C3 (not evident in 2007) and dorsally at C7 (stable). 3.  See thoracic and lumbar findings below. 4.  Chronic multinodular goiter.  MRI THORACIC SPINE WITHOUT CONTRAST  Technique: Multiplanar and multiecho pulse sequences of the thoracic spine were obtained without intravenous contrast.  Findings:  Upper thoracic vertebral height and alignment is stable. No upper thoracic  marrow edema.  Remote posterior decompression at T11-T12 and T12-L1. Stable posterior paraspinal soft tissues.  Stable visualized thoracic and upper abdominal viscera.  Evidence of predominately dorsal spinal cord signal abnormality at T4-T5 (series 11 image 8), and centered at T6 (image 7).  The former lesion appears to be new or increased.  The latter lesion appears to be chronic but increased.  Anterolisthesis of T11 on T12 is chronic but appears increased since 2012 and is associated with worsening multifactorial spinal stenosis at that level, see below.  There is evidence of associated mild spinal cord signal abnormality (series 9 image 7). Furthermore, most apparent on axial T2-weighted images there is chronic central cord signal abnormality occurring below the T7 level and extending to the stenotic T11-T12 level.  This is not significantly changed.  T1-T2: Stable mild facet hypertrophy.  T2-T3: Negative.  T3-T4: Severe facet degeneration on the right has progressed.  No significant spinal stenosis.  Right T3 foraminal stenosis is mildly progressed.  T4-T5: Facet degeneration on the right has progressed.  Right T4 foraminal stenosis has not significantly changed.  T5-T6: Facet hypertrophy greater on the right is not significantly changed.  T6-T7: Mild circumferential disc bulges stable.  Moderate facet and ligament flavum hypertrophy is stable.  Stable foraminal stenosis with no significant spinal stenosis.  T7-T8: Mild to moderate circumferential disc bulge appears mildly increased.  Moderate facet hypertrophy is increased.  Mild spinal stenosis is stable or mildly increased.  No significant spinal cord mass effect.  Bilateral foraminal stenosis is not significantly changed.  T8-T9: Circumferential disc bulge is stable.  Moderate facet hypertrophy is mildly increased.  No significant spinal stenosis.  T9-T10: Chronic circumferential disc bulges stable to mildly progressed.  Moderate facet hypertrophy is  mildly progressed.  Mild spinal stenosis.  No significant spinal cord mass effect.  T10-T11: Moderate to severe facet hypertrophy is mildly progressed. Mild spinal stenosis has mildly increased.  T11-T12: Anterolisthesis appears increased along with disc osteophyte complex and residual posterior element hypertrophy. Spinal stenosis now is severe (series 12 image 36) with associated cord compression.  Bilateral foraminal stenosis is moderate to severe and only mildly increased.  T12-L1:  Stable with no significant spinal or foraminal stenosis. No definite spinal cord signal abnormality at this level.  The conus medullaris occurs at L1-L2, see below.  IMPRESSION: 1.  Progression of anterolisthesis at T11-T12 following remote posterior decompression of this level.  Multifactorial spinal stenosis here has increased and is now severe (series 12 image 36). There is associated increased cord compression.  There is mild cord signal abnormality at this level, unclear if this is stable or increased.  See #2. 2.  Chronic central cord signal abnormality from T7-T8 to the stenotic T11-T12 level appears not significantly changed. Superimposed probable demyelinating plaques at T4-T5 and centered at T6 are chronic but increased since 2012. 3.  Mild progression of mostly posterior element degeneration in the upper thoracic spine.  Other levels of mild thoracic spinal stenosis have not significantly changed. 4.  See lumbar findings below.  MRI LUMBAR SPINE WITHOUT  CONTRAST  Technique: Multiplanar and multiecho pulse sequences of the lumbar spine were obtained without intravenous contrast.  Findings:  Stable visualized abdominal viscera.  There is moderate to severe bladder distention which is new.  Visualized paraspinal soft tissues are within normal limits.  Lumbar vertebral height and alignment has not significantly changed. No marrow edema or evidence of acute osseous abnormality.  L1-L2:  Chronic spinal stenosis with  indistinctness of the conus medullaris at this level, favor related to chronic redundancy of the spinal nerve roots occupying the ventral CSF space.  The appearance is not significantly changed.  Disc bulge, facet and ligament flavum hypertrophy here is stable.  L2-L3:  Chronic spinal stenosis related to right eccentric circumferential disc bulge and moderate facet and ligament flavum hypertrophy is stable and moderate.  L3-L4:  Chronic spinal stenosis related to bulky circumferential disc osteophyte complex, severe facet hypertrophy, and severe ligament flavum hypertrophy, is severe and stable to mildly progressed.  L4-L5:  Chronic spinal stenosis related to bulky right eccentric circumferential disc osteophyte complex and severe facet and severe ligament flavum hypertrophy is severe and not significantly changed.  Trace fluid in the facet joints greater on the left is stable.  Moderate to severe right L4 foraminal stenosis is stable.  L5-S1:  Chronic spinal stenosis at this level appears progressed and is related to mild right eccentric circumferential disc bulge plus severe facet and ligament flavum hypertrophy.  Spinal stenosis now is moderate to severe, previously mild to moderate.  Lateral recess stenosis now is moderate.  Mild left and moderate to severe right L5 foraminal stenosis has not significantly changed.  IMPRESSION: 1.  Diffuse chronic lumbar spinal stenosis has mildly progressed at L3-L4 and L5-S1 as detailed above.  Spinal stenosis is severe at L3- L4, L4-L5, and L5-S1. 2.  Moderate to severe distention of the bladder.  3. Chronic indistinctness of the conus medullaris felt related to compressed redundant nerve roots in the setting of diffuse lumbar spinal stenosis. 4.  No acute osseous abnormality in the lumbar spine.   Original Report Authenticated By: Erskine Speed, M.D.    [facility] Wo Or W/ Cm  07/23/2012  *RADIOLOGY REPORT*  Clinical Data: Evaluate spondylolisthesis and stenosis at the  T11- 12 level. Back pain with bilateral leg weakness.  CT THORACIC SPINE WITHOUT CONTRAST,CT LUMBAR SPINE WITHOUT CONTRAST  Technique:  Multidetector CT imaging of the thoracic and lumbar spine was performed without intravenous contrast.  Comparison: MRI thoracic spine 07/22/2012. Prior MRI from 01/03/2011.  Findings: The scan extends from T6-7 through the mid sacrum.  At the thoracic levels from T7-8 through T10-11, there is mild facet arthropathy and slight vertebral body spurring, but no visible compressive lesion.  There is 3 mm of anterolisthesis T11-12 with marked vacuum phenomenon. The endplates show marked subchondral cystic formation and sclerosis.  Marked extraforaminal spurring is greater on the right.  There is advanced facet arthropathy of the T11-T12 facet complex with joint space narrowing and subchondral cyst formation. Canal diameter estimated be 4 mm as measured on image 46 series 603.  Previous central laminectomy at this level performed in 2007. Moderate cord compression is better demonstrated on MR, as the central disc protrusion is poorly visualized.  At T12-L1, there is vacuum disc phenomenon with mild bulge.  Wide central laminectomies are present without significant facet arthropathy.  At L1-2, the disc space is significantly narrowed there is moderate osteophytic spurring on the left with facet arthropathy but no definite intraspinal  lesion.  Advanced vacuum phenomenon is noted at L2-3, L3-4, and L4-5 with osteophytic spurring in potentially symptomatic left sided nerve root encroachment particularly at L2-3  At L3-4, there is moderate to severe stenosis related to disc osteophyte complex greater on the left, and advanced facet arthropathy.  Marked vacuum disc phenomenon.  Left greater than right L4 and L3 nerve root encroachment.  At L4-L5 there is moderate multifactorial spinal stenosis related to central protrusion and posterior element hypertrophy.  At L5-S1, there is a central  protrusion with advanced facet and ligamentum flavum hypertrophy.  Moderate to severe stenosis is present at this level.  Bilateral neural foraminal narrowing is also appreciated.  Compared with prior MRI from 2012, there is increased anterolisthesis and cord compression.  IMPRESSION:  3 mm anterolisthesis T11-12 with marked vacuum joint phenomenon. Advanced bony overgrowth including facet degenerative changes. Prior central laminectomy.  Central protrusion T11-12  better demonstrated on MR.  Advanced facet arthropathy at this level contributes to spinal stenosis, estimated to be 4 mm.  Multilevel lumbar spondylosis as described.   Original Report Authenticated By: Davonna Belling, M.D.     Assessment/Plan: Patient with a history of multiple sclerosis as well as cervical, thoracic, and lumbar spondylosis and degenerative disc disease. Exam at this time shows primarily weakness in the right lower extremity. Overall clinical picture is more suggestive of a multiple sclerosis exacerbation. At this time CIR, along with vigorous treatment of her cough and pulmonary condition, will be most helpful. If her cough can be resolved, this is likely to alleviate her lower thoracic pain which is being aggravated by her repeated coughing spells.  Following discharge from CIR she will need to followup with her outpatient neurologist Dr. Avie Echevaria, and we'll need to see me about a month after discharge from CIR.  Although one could consider decompression in the lower thoracic spine along with a multilevel thoracolumbar arthrodesis, I feel will be best for her to recover from her MS exacerbation and undergo rehabilitation, prior to reassessment for consideration of surgical intervention.  I spoke with the patient at her bedside regarding the results of her CT and my recommendations from a neurosurgical perspective. Her questions were answered for her.   Hewitt Shorts, MD 07/23/2012, 1:26 PM

## 2012-07-23 NOTE — Progress Notes (Signed)
I met with patient at bedside to discuss inpt rehab admission. I then contacted Dr. Jule Ser by phone and he plan no surgical intervention at this time. Therefore we plan to admit pt to inpt rehab today. I contacted Dr. Susie Cassette and she is in agreement. RN is aware. [phone]

## 2012-07-23 NOTE — Progress Notes (Signed)
Subjective: Patient feels that her legs are feeling a little better today.   Exam: Filed Vitals:   07/23/12 0942  BP: 144/78  Pulse: 84  Temp:   Resp: 20   Gen: In bed, NAD MS: Awake, Alert, oriented to person, place, month, year ZO:XWRUE, EOMI Motor: 5/5 in UE bilaterally, 2/5 proximally lower extremities, right foot drop, 4/5 right plantarflexion, left plantar/dorsiflexion.  Sensory:intact to LT  Impression: 72 yo F with progressive LE weakness that worsened over the past week. With suspicion of MS flare she was started on steroids and MRI spine was obtained. Due to claustrophobia, she did not complete the post-contrast images. There is some thoracic spinal stenosis, and several lesions that could be responsible for her symptoms. For an MS flare, the patient is completing a three day course of solumedrol today. From an MS standpoint, I do not feel that post-contrast images would change our management significantly.  Recommendations: 1) If it would change surgical planning, then post-contrast spinal imaging could be obtained to see if an enhancing lesion is seen.  2) Could continue steroids for 2 more days for total of 5 days IV solumedrol   Ritta Slot, MD [facility] [phone]  If 7pm- 7am, please page neurology on call at [phone].

## 2012-07-24 ENCOUNTER — Inpatient Hospital Stay (HOSPITAL_COMMUNITY): Payer: Medicare Other

## 2012-07-24 ENCOUNTER — Inpatient Hospital Stay (HOSPITAL_COMMUNITY): Payer: Medicare Other | Admitting: Physical Therapy

## 2012-07-24 ENCOUNTER — Inpatient Hospital Stay (HOSPITAL_COMMUNITY): Payer: Medicare Other | Admitting: Occupational Therapy

## 2012-07-24 DIAGNOSIS — G35 Multiple sclerosis: Secondary | ICD-10-CM

## 2012-07-24 LAB — GLUCOSE, CAPILLARY
Glucose-Capillary: 166 mg/dL — ABNORMAL HIGH (ref 70–99)
Glucose-Capillary: 279 mg/dL — ABNORMAL HIGH (ref 70–99)

## 2012-07-24 NOTE — Plan of Care (Signed)
Problem: RH PAIN MANAGEMENT Goal: RH STG PAIN MANAGED AT OR BELOW PT'S PAIN GOAL Outcome: Progressing 3 or below

## 2012-07-24 NOTE — Progress Notes (Signed)
Subjective/Complaints: Had a good night. Still with some coughing. No SOB. Emptied bladder this am  Objective: Vital Signs: Blood pressure 133/81, pulse 70, temperature 98.1 F (36.7 C), temperature source Oral, resp. rate 20, height 5\' 2"  (1.575 m), weight 91 kg (200 lb 9.9 oz), SpO2 95.00%. Ct Lumbar Spine Wo Contrast  07/23/2012  *RADIOLOGY REPORT*  Clinical Data: Evaluate spondylolisthesis and stenosis at the T11- 12 level. Back pain with bilateral leg weakness.  CT THORACIC SPINE WITHOUT CONTRAST,CT LUMBAR SPINE WITHOUT CONTRAST  Technique:  Multidetector CT imaging of the thoracic and lumbar spine was performed without intravenous contrast.  Comparison: MRI thoracic spine 07/22/2012. Prior MRI from 01/03/2011.  Findings: The scan extends from T6-7 through the mid sacrum.  At the thoracic levels from T7-8 through T10-11, there is mild facet arthropathy and slight vertebral body spurring, but no visible compressive lesion.  There is 3 mm of anterolisthesis T11-12 with marked vacuum phenomenon. The endplates show marked subchondral cystic formation and sclerosis.  Marked extraforaminal spurring is greater on the right.  There is advanced facet arthropathy of the T11-T12 facet complex with joint space narrowing and subchondral cyst formation. Canal diameter estimated be 4 mm as measured on image 46 series 603.  Previous central laminectomy at this level performed in 2007. Moderate cord compression is better demonstrated on MR, as the central disc protrusion is poorly visualized.  At T12-L1, there is vacuum disc phenomenon with mild bulge.  Wide central laminectomies are present without significant facet arthropathy.  At L1-2, the disc space is significantly narrowed there is moderate osteophytic spurring on the left with facet arthropathy but no definite intraspinal lesion.  Advanced vacuum phenomenon is noted at L2-3, L3-4, and L4-5 with osteophytic spurring in potentially symptomatic left sided nerve  root encroachment particularly at L2-3  At L3-4, there is moderate to severe stenosis related to disc osteophyte complex greater on the left, and advanced facet arthropathy.  Marked vacuum disc phenomenon.  Left greater than right L4 and L3 nerve root encroachment.  At L4-L5 there is moderate multifactorial spinal stenosis related to central protrusion and posterior element hypertrophy.  At L5-S1, there is a central protrusion with advanced facet and ligamentum flavum hypertrophy.  Moderate to severe stenosis is present at this level.  Bilateral neural foraminal narrowing is also appreciated.  Compared with prior MRI from 2012, there is increased anterolisthesis and cord compression.  IMPRESSION:  3 mm anterolisthesis T11-12 with marked vacuum joint phenomenon. Advanced bony overgrowth including facet degenerative changes. Prior central laminectomy.  Central protrusion T11-12  better demonstrated on MR.  Advanced facet arthropathy at this level contributes to spinal stenosis, estimated to be 4 mm.  Multilevel lumbar spondylosis as described.   Original Report Authenticated By: Davonna Belling, M.D.    Mr Cervical Spine Wo Contrast  07/22/2012  *RADIOLOGY REPORT*  Clinical Data:  72 year old female with history of multiple sclerosis.  Recent shortness of breath and increased weakness. Possible MS FLAIR. Prior spine surgery.  New onset paraplegia and lumbar pain.  Comparison: [facility] lumbar radiographs 01/07/2011.  Thoracic and lumbar MRI 01/03/2011. Cervical MRI 10/19/2005.  MRI CERVICAL SPINE WITHOUT CONTRAST  Technique:  Multiplanar and multiecho pulse sequences of the cervic al spine, to include the craniocervical junction and cervicothoraci c junction, were obtained according to standard protocol without intravenous contrast.  Findings:  Chronic thyromegaly, especially the left lobe.  Suspect multiple small thyroid nodules.  Benign multinodular goiter is favored.  Other Visualized  paraspinal  soft tissues are within normal limits.  Chronic straightening of cervical lordosis.  Mild anterolisthesis of C4 on C5 is increased along with posterior element degeneration at that level, see below. No marrow edema or evidence of acute osseous abnormality.  Cervicomedullary junction is within normal limits.  Visualized brain stem and cerebellum within normal limits.  Evidence of a small probable demyelinating plaque in the dorsal upper cervical cord at C2-C3 (series 24 image 8, series 26 image 5) was not evident on the 2007 study. Tiny chronic left dorsal cord lesion at C7 (series 26 image 19) appears to be stable.  No other definite spinal cord signal abnormality.  C2-C3:  Facet and ligament flavum hypertrophy is increased.  No spinal stenosis.  Mild bilateral C3 foraminal stenosis has mildly increased.  C3-C4:  Progressive disc space loss and endplate degeneration. Less pronounced central disc protrusion.  No significant spinal stenosis.  Moderate bilateral C4 foraminal stenosis has increased.  C4-C5:  No spondylolisthesis.  Progressive disc and endplate degeneration.  New moderate to severe ligament flavum hypertrophy. Chronic moderate facet hypertrophy not significantly changed.  New spinal stenosis with minimal to mild spinal cord flattening. Severe right and moderate left C5 foraminal stenosis has progressed.  C5-C6:  Chronic disc and endplate degeneration has not significantly changed.  Moderate facet hypertrophy has not significantly changed.  Chronic borderline to mild spinal stenosis is stable.  No spinal cord mass effect.  Severe left and moderate to severe right C6 foraminal stenosis at mildly progressed.  C6-C7:  Chronic disc and endplate degeneration.  Stable broad-based central disc component.  Mild to moderate ligament flavum hypertrophy is not significantly changed.  Mild spinal stenosis with minimal to mild spinal cord mass effect has not significantly changed.  Severe left and moderate  right C7 foraminal stenosis has not significantly changed.  C7-T1:  Mild anterolisthesis is stable.  Bulky circumferential disc osteophyte complex and facet hypertrophy has not significantly changed.  Stable mild spinal stenosis.  Stable severe bilateral C8 foraminal stenosis.  Thoracic spine findings are below.  IMPRESSION: 1.  Multilevel multifactorial cervical spine degeneration has progressed since 2007.  New spinal stenosis at C4-C5.  Mild intermittent cervical spinal cord mass effect with no compression related cervical spinal cord signal abnormality. 2.  Two small probable demyelinating plaques in the cervical spinal cord dorsally at C2-C3 (not evident in 2007) and dorsally at C7 (stable). 3.  See thoracic and lumbar findings below. 4.  Chronic multinodular goiter.  MRI THORACIC SPINE WITHOUT CONTRAST  Technique: Multiplanar and multiecho pulse sequences of the thoracic spine were obtained without intravenous contrast.  Findings:  Upper thoracic vertebral height and alignment is stable. No upper thoracic marrow edema.  Remote posterior decompression at T11-T12 and T12-L1. Stable posterior paraspinal soft tissues.  Stable visualized thoracic and upper abdominal viscera.  Evidence of predominately dorsal spinal cord signal abnormality at T4-T5 (series 11 image 8), and centered at T6 (image 7).  The former lesion appears to be new or increased.  The latter lesion appears to be chronic but increased.  Anterolisthesis of T11 on T12 is chronic but appears increased since 2012 and is associated with worsening multifactorial spinal stenosis at that level, see below.  There is evidence of associated mild spinal cord signal abnormality (series 9 image 7). Furthermore, most apparent on axial T2-weighted images there is chronic central cord signal abnormality occurring below the T7 level and extending to the stenotic T11-T12 level.  This is not significantly changed.  T1-T2: Stable  mild facet hypertrophy.  T2-T3:  Negative.  T3-T4: Severe facet degeneration on the right has progressed.  No significant spinal stenosis.  Right T3 foraminal stenosis is mildly progressed.  T4-T5: Facet degeneration on the right has progressed.  Right T4 foraminal stenosis has not significantly changed.  T5-T6: Facet hypertrophy greater on the right is not significantly changed.  T6-T7: Mild circumferential disc bulges stable.  Moderate facet and ligament flavum hypertrophy is stable.  Stable foraminal stenosis with no significant spinal stenosis.  T7-T8: Mild to moderate circumferential disc bulge appears mildly increased.  Moderate facet hypertrophy is increased.  Mild spinal stenosis is stable or mildly increased.  No significant spinal cord mass effect.  Bilateral foraminal stenosis is not significantly changed.  T8-T9: Circumferential disc bulge is stable.  Moderate facet hypertrophy is mildly increased.  No significant spinal stenosis.  T9-T10: Chronic circumferential disc bulges stable to mildly progressed.  Moderate facet hypertrophy is mildly progressed.  Mild spinal stenosis.  No significant spinal cord mass effect.  T10-T11: Moderate to severe facet hypertrophy is mildly progressed. Mild spinal stenosis has mildly increased.  T11-T12: Anterolisthesis appears increased along with disc osteophyte complex and residual posterior element hypertrophy. Spinal stenosis now is severe (series 12 image 36) with associated cord compression.  Bilateral foraminal stenosis is moderate to severe and only mildly increased.  T12-L1:  Stable with no significant spinal or foraminal stenosis. No definite spinal cord signal abnormality at this level.  The conus medullaris occurs at L1-L2, see below.  IMPRESSION: 1.  Progression of anterolisthesis at T11-T12 following remote posterior decompression of this level.  Multifactorial spinal stenosis here has increased and is now severe (series 12 image 36). There is associated increased cord compression.  There is  mild cord signal abnormality at this level, unclear if this is stable or increased.  See #2. 2.  Chronic central cord signal abnormality from T7-T8 to the stenotic T11-T12 level appears not significantly changed. Superimposed probable demyelinating plaques at T4-T5 and centered at T6 are chronic but increased since 2012. 3.  Mild progression of mostly posterior element degeneration in the upper thoracic spine.  Other levels of mild thoracic spinal stenosis have not significantly changed. 4.  See lumbar findings below.  MRI LUMBAR SPINE WITHOUT CONTRAST  Technique: Multiplanar and multiecho pulse sequences of the lumbar spine were obtained without intravenous contrast.  Findings:  Stable visualized abdominal viscera.  There is moderate to severe bladder distention which is new.  Visualized paraspinal soft tissues are within normal limits.  Lumbar vertebral height and alignment has not significantly changed. No marrow edema or evidence of acute osseous abnormality.  L1-L2:  Chronic spinal stenosis with indistinctness of the conus medullaris at this level, favor related to chronic redundancy of the spinal nerve roots occupying the ventral CSF space.  The appearance is not significantly changed.  Disc bulge, facet and ligament flavum hypertrophy here is stable.  L2-L3:  Chronic spinal stenosis related to right eccentric circumferential disc bulge and moderate facet and ligament flavum hypertrophy is stable and moderate.  L3-L4:  Chronic spinal stenosis related to bulky circumferential disc osteophyte complex, severe facet hypertrophy, and severe ligament flavum hypertrophy, is severe and stable to mildly progressed.  L4-L5:  Chronic spinal stenosis related to bulky right eccentric circumferential disc osteophyte complex and severe facet and severe ligament flavum hypertrophy is severe and not significantly changed.  Trace fluid in the facet joints greater on the left is stable.  Moderate to severe right L4 foraminal  stenosis is stable.  L5-S1:  Chronic spinal stenosis at this level appears progressed and is related to mild right eccentric circumferential disc bulge plus severe facet and ligament flavum hypertrophy.  Spinal stenosis now is moderate to severe, previously mild to moderate.  Lateral recess stenosis now is moderate.  Mild left and moderate to severe right L5 foraminal stenosis has not significantly changed.  IMPRESSION: 1.  Diffuse chronic lumbar spinal stenosis has mildly progressed at L3-L4 and L5-S1 as detailed above.  Spinal stenosis is severe at L3- L4, L4-L5, and L5-S1. 2.  Moderate to severe distention of the bladder.  3. Chronic indistinctness of the conus medullaris felt related to compressed redundant nerve roots in the setting of diffuse lumbar spinal stenosis. 4.  No acute osseous abnormality in the lumbar spine.   Original Report Authenticated By: Erskine Speed, M.D.    Mr Thoracic Spine Wo Contrast  07/22/2012  *RADIOLOGY REPORT*  Clinical Data:  72 year old female with history of multiple sclerosis.  Recent shortness of breath and increased weakness. Possible MS FLAIR. Prior spine surgery.  New onset paraplegia and lumbar pain.  Comparison: [facility] lumbar radiographs 01/07/2011.  Thoracic and lumbar MRI 01/03/2011. Cervical MRI 10/19/2005.  MRI CERVICAL SPINE WITHOUT CONTRAST  Technique:  Multiplanar and multiecho pulse sequences of the cervic al spine, to include the craniocervical junction and cervicothoraci c junction, were obtained according to standard protocol without intravenous contrast.  Findings:  Chronic thyromegaly, especially the left lobe.  Suspect multiple small thyroid nodules.  Benign multinodular goiter is favored.  Other Visualized paraspinal soft tissues are within normal limits.  Chronic straightening of cervical lordosis.  Mild anterolisthesis of C4 on C5 is increased along with posterior element degeneration at that level, see below. No marrow edema  or evidence of acute osseous abnormality.  Cervicomedullary junction is within normal limits.  Visualized brain stem and cerebellum within normal limits.  Evidence of a small probable demyelinating plaque in the dorsal upper cervical cord at C2-C3 (series 24 image 8, series 26 image 5) was not evident on the 2007 study. Tiny chronic left dorsal cord lesion at C7 (series 26 image 19) appears to be stable.  No other definite spinal cord signal abnormality.  C2-C3:  Facet and ligament flavum hypertrophy is increased.  No spinal stenosis.  Mild bilateral C3 foraminal stenosis has mildly increased.  C3-C4:  Progressive disc space loss and endplate degeneration. Less pronounced central disc protrusion.  No significant spinal stenosis.  Moderate bilateral C4 foraminal stenosis has increased.  C4-C5:  No spondylolisthesis.  Progressive disc and endplate degeneration.  New moderate to severe ligament flavum hypertrophy. Chronic moderate facet hypertrophy not significantly changed.  New spinal stenosis with minimal to mild spinal cord flattening. Severe right and moderate left C5 foraminal stenosis has progressed.  C5-C6:  Chronic disc and endplate degeneration has not significantly changed.  Moderate facet hypertrophy has not significantly changed.  Chronic borderline to mild spinal stenosis is stable.  No spinal cord mass effect.  Severe left and moderate to severe right C6 foraminal stenosis at mildly progressed.  C6-C7:  Chronic disc and endplate degeneration.  Stable broad-based central disc component.  Mild to moderate ligament flavum hypertrophy is not significantly changed.  Mild spinal stenosis with minimal to mild spinal cord mass effect has not significantly changed.  Severe left and moderate right C7 foraminal stenosis has not significantly changed.  C7-T1:  Mild anterolisthesis is stable.  Bulky circumferential disc osteophyte complex and  facet hypertrophy has not significantly changed.  Stable mild spinal  stenosis.  Stable severe bilateral C8 foraminal stenosis.  Thoracic spine findings are below.  IMPRESSION: 1.  Multilevel multifactorial cervical spine degeneration has progressed since 2007.  New spinal stenosis at C4-C5.  Mild intermittent cervical spinal cord mass effect with no compression related cervical spinal cord signal abnormality. 2.  Two small probable demyelinating plaques in the cervical spinal cord dorsally at C2-C3 (not evident in 2007) and dorsally at C7 (stable). 3.  See thoracic and lumbar findings below. 4.  Chronic multinodular goiter.  MRI THORACIC SPINE WITHOUT CONTRAST  Technique: Multiplanar and multiecho pulse sequences of the thoracic spine were obtained without intravenous contrast.  Findings:  Upper thoracic vertebral height and alignment is stable. No upper thoracic marrow edema.  Remote posterior decompression at T11-T12 and T12-L1. Stable posterior paraspinal soft tissues.  Stable visualized thoracic and upper abdominal viscera.  Evidence of predominately dorsal spinal cord signal abnormality at T4-T5 (series 11 image 8), and centered at T6 (image 7).  The former lesion appears to be new or increased.  The latter lesion appears to be chronic but increased.  Anterolisthesis of T11 on T12 is chronic but appears increased since 2012 and is associated with worsening multifactorial spinal stenosis at that level, see below.  There is evidence of associated mild spinal cord signal abnormality (series 9 image 7). Furthermore, most apparent on axial T2-weighted images there is chronic central cord signal abnormality occurring below the T7 level and extending to the stenotic T11-T12 level.  This is not significantly changed.  T1-T2: Stable mild facet hypertrophy.  T2-T3: Negative.  T3-T4: Severe facet degeneration on the right has progressed.  No significant spinal stenosis.  Right T3 foraminal stenosis is mildly progressed.  T4-T5: Facet degeneration on the right has progressed.  Right T4  foraminal stenosis has not significantly changed.  T5-T6: Facet hypertrophy greater on the right is not significantly changed.  T6-T7: Mild circumferential disc bulges stable.  Moderate facet and ligament flavum hypertrophy is stable.  Stable foraminal stenosis with no significant spinal stenosis.  T7-T8: Mild to moderate circumferential disc bulge appears mildly increased.  Moderate facet hypertrophy is increased.  Mild spinal stenosis is stable or mildly increased.  No significant spinal cord mass effect.  Bilateral foraminal stenosis is not significantly changed.  T8-T9: Circumferential disc bulge is stable.  Moderate facet hypertrophy is mildly increased.  No significant spinal stenosis.  T9-T10: Chronic circumferential disc bulges stable to mildly progressed.  Moderate facet hypertrophy is mildly progressed.  Mild spinal stenosis.  No significant spinal cord mass effect.  T10-T11: Moderate to severe facet hypertrophy is mildly progressed. Mild spinal stenosis has mildly increased.  T11-T12: Anterolisthesis appears increased along with disc osteophyte complex and residual posterior element hypertrophy. Spinal stenosis now is severe (series 12 image 36) with associated cord compression.  Bilateral foraminal stenosis is moderate to severe and only mildly increased.  T12-L1:  Stable with no significant spinal or foraminal stenosis. No definite spinal cord signal abnormality at this level.  The conus medullaris occurs at L1-L2, see below.  IMPRESSION: 1.  Progression of anterolisthesis at T11-T12 following remote posterior decompression of this level.  Multifactorial spinal stenosis here has increased and is now severe (series 12 image 36). There is associated increased cord compression.  There is mild cord signal abnormality at this level, unclear if this is stable or increased.  See #2. 2.  Chronic central cord signal abnormality from T7-T8 to the  stenotic T11-T12 level appears not significantly changed.  Superimposed probable demyelinating plaques at T4-T5 and centered at T6 are chronic but increased since 2012. 3.  Mild progression of mostly posterior element degeneration in the upper thoracic spine.  Other levels of mild thoracic spinal stenosis have not significantly changed. 4.  See lumbar findings below.  MRI LUMBAR SPINE WITHOUT CONTRAST  Technique: Multiplanar and multiecho pulse sequences of the lumbar spine were obtained without intravenous contrast.  Findings:  Stable visualized abdominal viscera.  There is moderate to severe bladder distention which is new.  Visualized paraspinal soft tissues are within normal limits.  Lumbar vertebral height and alignment has not significantly changed. No marrow edema or evidence of acute osseous abnormality.  L1-L2:  Chronic spinal stenosis with indistinctness of the conus medullaris at this level, favor related to chronic redundancy of the spinal nerve roots occupying the ventral CSF space.  The appearance is not significantly changed.  Disc bulge, facet and ligament flavum hypertrophy here is stable.  L2-L3:  Chronic spinal stenosis related to right eccentric circumferential disc bulge and moderate facet and ligament flavum hypertrophy is stable and moderate.  L3-L4:  Chronic spinal stenosis related to bulky circumferential disc osteophyte complex, severe facet hypertrophy, and severe ligament flavum hypertrophy, is severe and stable to mildly progressed.  L4-L5:  Chronic spinal stenosis related to bulky right eccentric circumferential disc osteophyte complex and severe facet and severe ligament flavum hypertrophy is severe and not significantly changed.  Trace fluid in the facet joints greater on the left is stable.  Moderate to severe right L4 foraminal stenosis is stable.  L5-S1:  Chronic spinal stenosis at this level appears progressed and is related to mild right eccentric circumferential disc bulge plus severe facet and ligament flavum hypertrophy.  Spinal  stenosis now is moderate to severe, previously mild to moderate.  Lateral recess stenosis now is moderate.  Mild left and moderate to severe right L5 foraminal stenosis has not significantly changed.  IMPRESSION: 1.  Diffuse chronic lumbar spinal stenosis has mildly progressed at L3-L4 and L5-S1 as detailed above.  Spinal stenosis is severe at L3- L4, L4-L5, and L5-S1. 2.  Moderate to severe distention of the bladder.  3. Chronic indistinctness of the conus medullaris felt related to compressed redundant nerve roots in the setting of diffuse lumbar spinal stenosis. 4.  No acute osseous abnormality in the lumbar spine.   Original Report Authenticated By: Erskine Speed, M.D.    Mr Lumbar Spine Wo Contrast  07/22/2012  *RADIOLOGY REPORT*  Clinical Data:  72 year old female with history of multiple sclerosis.  Recent shortness of breath and increased weakness. Possible MS FLAIR. Prior spine surgery.  New onset paraplegia and lumbar pain.  Comparison: [facility] lumbar radiographs 01/07/2011.  Thoracic and lumbar MRI 01/03/2011. Cervical MRI 10/19/2005.  MRI CERVICAL SPINE WITHOUT CONTRAST  Technique:  Multiplanar and multiecho pulse sequences of the cervic al spine, to include the craniocervical junction and cervicothoraci c junction, were obtained according to standard protocol without intravenous contrast.  Findings:  Chronic thyromegaly, especially the left lobe.  Suspect multiple small thyroid nodules.  Benign multinodular goiter is favored.  Other Visualized paraspinal soft tissues are within normal limits.  Chronic straightening of cervical lordosis.  Mild anterolisthesis of C4 on C5 is increased along with posterior element degeneration at that level, see below. No marrow edema or evidence of acute osseous abnormality.  Cervicomedullary junction is within normal limits.  Visualized brain stem and cerebellum  within normal limits.  Evidence of a small probable demyelinating plaque in the  dorsal upper cervical cord at C2-C3 (series 24 image 8, series 26 image 5) was not evident on the 2007 study. Tiny chronic left dorsal cord lesion at C7 (series 26 image 19) appears to be stable.  No other definite spinal cord signal abnormality.  C2-C3:  Facet and ligament flavum hypertrophy is increased.  No spinal stenosis.  Mild bilateral C3 foraminal stenosis has mildly increased.  C3-C4:  Progressive disc space loss and endplate degeneration. Less pronounced central disc protrusion.  No significant spinal stenosis.  Moderate bilateral C4 foraminal stenosis has increased.  C4-C5:  No spondylolisthesis.  Progressive disc and endplate degeneration.  New moderate to severe ligament flavum hypertrophy. Chronic moderate facet hypertrophy not significantly changed.  New spinal stenosis with minimal to mild spinal cord flattening. Severe right and moderate left C5 foraminal stenosis has progressed.  C5-C6:  Chronic disc and endplate degeneration has not significantly changed.  Moderate facet hypertrophy has not significantly changed.  Chronic borderline to mild spinal stenosis is stable.  No spinal cord mass effect.  Severe left and moderate to severe right C6 foraminal stenosis at mildly progressed.  C6-C7:  Chronic disc and endplate degeneration.  Stable broad-based central disc component.  Mild to moderate ligament flavum hypertrophy is not significantly changed.  Mild spinal stenosis with minimal to mild spinal cord mass effect has not significantly changed.  Severe left and moderate right C7 foraminal stenosis has not significantly changed.  C7-T1:  Mild anterolisthesis is stable.  Bulky circumferential disc osteophyte complex and facet hypertrophy has not significantly changed.  Stable mild spinal stenosis.  Stable severe bilateral C8 foraminal stenosis.  Thoracic spine findings are below.  IMPRESSION: 1.  Multilevel multifactorial cervical spine degeneration has progressed since 2007.  New spinal stenosis at  C4-C5.  Mild intermittent cervical spinal cord mass effect with no compression related cervical spinal cord signal abnormality. 2.  Two small probable demyelinating plaques in the cervical spinal cord dorsally at C2-C3 (not evident in 2007) and dorsally at C7 (stable). 3.  See thoracic and lumbar findings below. 4.  Chronic multinodular goiter.  MRI THORACIC SPINE WITHOUT CONTRAST  Technique: Multiplanar and multiecho pulse sequences of the thoracic spine were obtained without intravenous contrast.  Findings:  Upper thoracic vertebral height and alignment is stable. No upper thoracic marrow edema.  Remote posterior decompression at T11-T12 and T12-L1. Stable posterior paraspinal soft tissues.  Stable visualized thoracic and upper abdominal viscera.  Evidence of predominately dorsal spinal cord signal abnormality at T4-T5 (series 11 image 8), and centered at T6 (image 7).  The former lesion appears to be new or increased.  The latter lesion appears to be chronic but increased.  Anterolisthesis of T11 on T12 is chronic but appears increased since 2012 and is associated with worsening multifactorial spinal stenosis at that level, see below.  There is evidence of associated mild spinal cord signal abnormality (series 9 image 7). Furthermore, most apparent on axial T2-weighted images there is chronic central cord signal abnormality occurring below the T7 level and extending to the stenotic T11-T12 level.  This is not significantly changed.  T1-T2: Stable mild facet hypertrophy.  T2-T3: Negative.  T3-T4: Severe facet degeneration on the right has progressed.  No significant spinal stenosis.  Right T3 foraminal stenosis is mildly progressed.  T4-T5: Facet degeneration on the right has progressed.  Right T4 foraminal stenosis has not significantly changed.  T5-T6: Facet hypertrophy greater  on the right is not significantly changed.  T6-T7: Mild circumferential disc bulges stable.  Moderate facet and ligament flavum  hypertrophy is stable.  Stable foraminal stenosis with no significant spinal stenosis.  T7-T8: Mild to moderate circumferential disc bulge appears mildly increased.  Moderate facet hypertrophy is increased.  Mild spinal stenosis is stable or mildly increased.  No significant spinal cord mass effect.  Bilateral foraminal stenosis is not significantly changed.  T8-T9: Circumferential disc bulge is stable.  Moderate facet hypertrophy is mildly increased.  No significant spinal stenosis.  T9-T10: Chronic circumferential disc bulges stable to mildly progressed.  Moderate facet hypertrophy is mildly progressed.  Mild spinal stenosis.  No significant spinal cord mass effect.  T10-T11: Moderate to severe facet hypertrophy is mildly progressed. Mild spinal stenosis has mildly increased.  T11-T12: Anterolisthesis appears increased along with disc osteophyte complex and residual posterior element hypertrophy. Spinal stenosis now is severe (series 12 image 36) with associated cord compression.  Bilateral foraminal stenosis is moderate to severe and only mildly increased.  T12-L1:  Stable with no significant spinal or foraminal stenosis. No definite spinal cord signal abnormality at this level.  The conus medullaris occurs at L1-L2, see below.  IMPRESSION: 1.  Progression of anterolisthesis at T11-T12 following remote posterior decompression of this level.  Multifactorial spinal stenosis here has increased and is now severe (series 12 image 36). There is associated increased cord compression.  There is mild cord signal abnormality at this level, unclear if this is stable or increased.  See #2. 2.  Chronic central cord signal abnormality from T7-T8 to the stenotic T11-T12 level appears not significantly changed. Superimposed probable demyelinating plaques at T4-T5 and centered at T6 are chronic but increased since 2012. 3.  Mild progression of mostly posterior element degeneration in the upper thoracic spine.  Other levels of  mild thoracic spinal stenosis have not significantly changed. 4.  See lumbar findings below.  MRI LUMBAR SPINE WITHOUT CONTRAST  Technique: Multiplanar and multiecho pulse sequences of the lumbar spine were obtained without intravenous contrast.  Findings:  Stable visualized abdominal viscera.  There is moderate to severe bladder distention which is new.  Visualized paraspinal soft tissues are within normal limits.  Lumbar vertebral height and alignment has not significantly changed. No marrow edema or evidence of acute osseous abnormality.  L1-L2:  Chronic spinal stenosis with indistinctness of the conus medullaris at this level, favor related to chronic redundancy of the spinal nerve roots occupying the ventral CSF space.  The appearance is not significantly changed.  Disc bulge, facet and ligament flavum hypertrophy here is stable.  L2-L3:  Chronic spinal stenosis related to right eccentric circumferential disc bulge and moderate facet and ligament flavum hypertrophy is stable and moderate.  L3-L4:  Chronic spinal stenosis related to bulky circumferential disc osteophyte complex, severe facet hypertrophy, and severe ligament flavum hypertrophy, is severe and stable to mildly progressed.  L4-L5:  Chronic spinal stenosis related to bulky right eccentric circumferential disc osteophyte complex and severe facet and severe ligament flavum hypertrophy is severe and not significantly changed.  Trace fluid in the facet joints greater on the left is stable.  Moderate to severe right L4 foraminal stenosis is stable.  L5-S1:  Chronic spinal stenosis at this level appears progressed and is related to mild right eccentric circumferential disc bulge plus severe facet and ligament flavum hypertrophy.  Spinal stenosis now is moderate to severe, previously mild to moderate.  Lateral recess stenosis now is moderate.  Mild left  and moderate to severe right L5 foraminal stenosis has not significantly changed.  IMPRESSION: 1.   Diffuse chronic lumbar spinal stenosis has mildly progressed at L3-L4 and L5-S1 as detailed above.  Spinal stenosis is severe at L3- L4, L4-L5, and L5-S1. 2.  Moderate to severe distention of the bladder.  3. Chronic indistinctness of the conus medullaris felt related to compressed redundant nerve roots in the setting of diffuse lumbar spinal stenosis. 4.  No acute osseous abnormality in the lumbar spine.   Original Report Authenticated By: Erskine Speed, M.D.    [facility] Or W/ Cm  07/23/2012  *RADIOLOGY REPORT*  Clinical Data: Evaluate spondylolisthesis and stenosis at the T11- 12 level. Back pain with bilateral leg weakness.  CT THORACIC SPINE WITHOUT CONTRAST,CT LUMBAR SPINE WITHOUT CONTRAST  Technique:  Multidetector CT imaging of the thoracic and lumbar spine was performed without intravenous contrast.  Comparison: MRI thoracic spine 07/22/2012. Prior MRI from 01/03/2011.  Findings: The scan extends from T6-7 through the mid sacrum.  At the thoracic levels from T7-8 through T10-11, there is mild facet arthropathy and slight vertebral body spurring, but no visible compressive lesion.  There is 3 mm of anterolisthesis T11-12 with marked vacuum phenomenon. The endplates show marked subchondral cystic formation and sclerosis.  Marked extraforaminal spurring is greater on the right.  There is advanced facet arthropathy of the T11-T12 facet complex with joint space narrowing and subchondral cyst formation. Canal diameter estimated be 4 mm as measured on image 46 series 603.  Previous central laminectomy at this level performed in 2007. Moderate cord compression is better demonstrated on MR, as the central disc protrusion is poorly visualized.  At T12-L1, there is vacuum disc phenomenon with mild bulge.  Wide central laminectomies are present without significant facet arthropathy.  At L1-2, the disc space is significantly narrowed there is moderate osteophytic spurring on the left with facet arthropathy but  no definite intraspinal lesion.  Advanced vacuum phenomenon is noted at L2-3, L3-4, and L4-5 with osteophytic spurring in potentially symptomatic left sided nerve root encroachment particularly at L2-3  At L3-4, there is moderate to severe stenosis related to disc osteophyte complex greater on the left, and advanced facet arthropathy.  Marked vacuum disc phenomenon.  Left greater than right L4 and L3 nerve root encroachment.  At L4-L5 there is moderate multifactorial spinal stenosis related to central protrusion and posterior element hypertrophy.  At L5-S1, there is a central protrusion with advanced facet and ligamentum flavum hypertrophy.  Moderate to severe stenosis is present at this level.  Bilateral neural foraminal narrowing is also appreciated.  Compared with prior MRI from 2012, there is increased anterolisthesis and cord compression.  IMPRESSION:  3 mm anterolisthesis T11-12 with marked vacuum joint phenomenon. Advanced bony overgrowth including facet degenerative changes. Prior central laminectomy.  Central protrusion T11-12  better demonstrated on MR.  Advanced facet arthropathy at this level contributes to spinal stenosis, estimated to be 4 mm.  Multilevel lumbar spondylosis as described.   Original Report Authenticated By: Davonna Belling, M.D.     Basename 07/23/12 0940 07/22/12 0435  WBC 23.1* 15.4*  HGB 13.3 12.4  HCT 39.3 38.1  PLT 293 291    Basename 07/22/12 0435 07/21/12 2236 07/21/12 1132  NA 140 -- 140  K 4.5 -- 4.4  CL 102 -- 100  GLUCOSE 282* -- 147*  BUN 19 -- 16  CREATININE 0.74 0.91 --  CALCIUM 9.8 -- 9.7   CBG (last 3)  Basename 07/23/12 2217 07/23/12 1621 07/23/12 1206  GLUCAP 213* 218* 207*    Wt Readings from Last 3 Encounters:  07/23/12 91 kg (200 lb 9.9 oz)  07/22/12 91.2 kg (201 lb 1 oz)    Physical Exam:  Constitutional: She is oriented to person, place, and time. She appears well-developed and well-nourished.  HENT:  Head: Normocephalic.  Left Ear:  External ear normal.  Eyes: Pupils are equal, round, and reactive to light.  Neck: Normal range of motion.  Cardiovascular: Normal rate and regular rhythm.  Pulmonary/Chest: She is CTA bilaterally. No distress Neurological: She is alert and oriented to person, place, and time.  RLE with impaired proprioception At the toes. Decreased sensation to LT below the knees. DTR's 2++ Skin: Skin is warm and dry.  Motor strength is 4/5 in bilateral deltoid, biceps, triceps, grip  2 minus in the left hip flexors 3 minus knee extensors, right HF 1+, KF 1+ to 2;  1/5 in the right ankle dorsiflexor plantar flexor and 2+ to 3-/5 in the left ankle dorsiflexor plantar flexor    Assessment/Plan: 1. Functional deficits secondary to multiple sclerosis with neurogenic bowel and bladder and thoracic myelopathy which require 3+ hours per day of interdisciplinary therapy in a comprehensive inpatient rehab setting. Physiatrist is providing close team supervision and 24 hour management of active medical problems listed below. Physiatrist and rehab team continue to assess barriers to discharge/monitor patient progress toward functional and medical goals. FIM:                   Comprehension Comprehension Mode: Auditory Comprehension: 6-Follows complex conversation/direction: With extra time/assistive device  Expression Expression Mode: Verbal Expression: 6-Expresses complex ideas: With extra time/assistive device  Social Interaction Social Interaction: 6-Interacts appropriately with others with medication or extra time (anti-anxiety, antidepressant).  Problem Solving Problem Solving: 5-Solves basic problems: With no assist  Memory Memory: 6-More than reasonable amt of time  Medical Problem List and Plan:  1. DVT Prophylaxis/Anticoagulation: Pharmaceutical: Lovenox  2. Pain Management: Will continue prn morphine for back pain from coughing spells.  3. Mood: Reporting anxiety causing insomnia. Ego  support provided. Continue cymbalta daily. Will have LCSW follow for formal evaluation.  4. Neuropsych: This patient is capable of making decisions on his/her own behalf.  5. DM type 2: continue AC/HS CBG checks. BS uncontrolled due to steroids on board. Continue Amaryl and canagliflozin. Will continue lantus with novolog till off steroids.  6. MS flare: IV solumedrol D#3/5 followed by oral taper? Continue rebiff 3 X week.  7. Bronchitis with likely PNA: PO Levaquin to po route D43/7. Schedule anti-tussives. Use nebs prn SOB/wheezing. IS 8. Leukocytosis: likely reactive due to PNA as well as steroid effect. No fevers noted and patient clinically looks better. Will monitor for symptoms of infection. CXR today 9. HTN: continue norvasc, avapro and atenolol daily. Monitor BP on bid basis.  10. Neurogenic bladder: Checking PVRs as distended bladder noted on imaging.   nursing toilet every 4 hours and scan to check bladder volumes. UCS 01/21 without growth.  LOS (Days) 1 A FACE TO FACE EVALUATION WAS PERFORMED  Laura Roberts T 07/24/2012 7:32 AM

## 2012-07-24 NOTE — Progress Notes (Signed)
Social Work  Social Work Assessment and Plan  Patient Details  Name: Laura Roberts MRN: [id number] Date of Birth: 1941-03-27  Today's Date: 07/24/2012  Problem List:  Patient Active Problem List  Diagnosis  . Multiple sclerosis  . SOB (shortness of breath)  . Weakness  . Bronchitis  . Multiple sclerosis exacerbation  . Cough  . HTN (hypertension)  . DM (diabetes mellitus)  . HLD (hyperlipidemia)  . Tachycardia  . GERD (gastroesophageal reflux disease)   Past Medical History:  Past Medical History  Diagnosis Date  . Hypertension   . Multiple sclerosis   . Diabetes mellitus without complication   . Neuromuscular disorder     Bilateral hand carpal tunnel syndrome   Past Surgical History:  Past Surgical History  Procedure Date  . Replacement total knee bilateral 2004  . Abdominal hysterectomy   . Cholecystectomy   . Back surgery   . Left hand carpal tunnel surgery    Social History:  reports that she has never smoked. She does not have any smokeless tobacco history on file. She reports that she does not drink alcohol or use illicit drugs.  Family / Support Systems Marital Status: Widow/Widower How Long?: 18 years Patient Roles: Parent Children: son, Lewie Loron) @ [phone] and [name] [facility]) @ (H) [phone] or [phone] Other Supports: friend, Rollene Rotunda @ [phone] Anticipated Caregiver: Rollene Rotunda Ability/Limitations of Caregiver:  (Min assist) Caregiver Availability: 24/7 Family Dynamics: Pt notes she is very close with both of her sons and that they stay in close contact with her.  She often goes to spend several weeks at a time at son's home in [place] "... he'd like me to spend longer"  Social History Preferred language: English Religion: Lutheran Cultural Background: na Education: college courses Read: Yes Write: Yes Employment Status: Retired Date Retired/Disabled/Unemployed: 2007 (stopped with MS diagnosis) Nurse, adult Issues: none Guardian/Conservator: son, [name], has POA for pt   Abuse/Neglect Physical Abuse: Denies Verbal Abuse: Denies Sexual Abuse: Denies Exploitation of patient/patient's resources: Denies Self-Neglect: Denies  Emotional Status Pt's affect, behavior adn adjustment status: Pt very pleasant, talkative and hopeful about gains that she will make on Rehab.  Does note h/o depression related to dealing with her MS - on medication - but does not feel any significant s/s of depression currently.   Recent Psychosocial Issues: None Pyschiatric History: As noted, primary MD placed her on anti-depressant, Cymbalta and she feels it manages her depression well. Substance Abuse History: none  Patient / Family Perceptions, Expectations & Goals Pt/Family understanding of illness & functional limitations: Pt reports that "... they think my back problems are what has caused my MS to flare up".  Good understanding of her current functional deficits and need for CIR. Premorbid pt/family roles/activities: Pt was functioning independently at home PTA - using a walker and driving still.  Reports she would ask friends for assistance if needed and would ask them to point out if he cognition seemed impaired.   Anticipated changes in roles/activities/participation: May need friend, Dennie Bible, to assume more caregiver responsibilities directly after d/c.   Pt/family expectations/goals: "I want to be able to do as much for myself as I can"  [employer]: Other (Comment) (Involved with MS Society/ support groups) Premorbid Home Care/DME Agencies: Other (Comment) (Neuro Rehab in past) Transportation available at discharge: yes Resource referrals recommended: Support group (specify) (resume MS group)  Discharge Planning Living Arrangements: Alone Support Systems: Children;Friends/neighbors Type of Residence: Private  residence Community education officer Resources: Electrical engineer Resources: Patent examiner Screen Referred: No Living Expenses: Psychologist, sport and exercise Management: Patient Do you have any problems obtaining your medications?: No Home Management: Pt was independent with this PTA - may need assistance upon d/c Patient/Family Preliminary Plans: Pt plans to return to her own home with friend, Dennie Bible, to stay as long as needed Social Work Anticipated Follow Up Needs: HH/OP;Support Group Expected length of stay: 1 week  Clinical Impression Very pleasant, motivated woman here after MS flare.  Has good support at home/ community and very involved with local MS Society Chapter.  Anticipate short LOS. No current s/s of depression or anxiety  - taking an antidepressant since original disgnosis 2007.  Lakisha Peyser 07/24/2012, 4:13 PM

## 2012-07-24 NOTE — Progress Notes (Signed)
Occupational Therapy Note  Patient Details  Name: Laura Roberts MRN: [id number] Date of Birth: December 04, 1940 Today's Date: 07/24/2012  Time: 1345-1425 Pt denies pain Individual Therapy Pt resting in recliner upon arrival.  Pt performed stand pivot transfer with mod A to w/c.  Pt rolled to gym to practice stand pivot transfers, bed mobility, standing activities, and standing balance while performing weight shifts.  Pt performed sit->supine and supine->sit with supervision.  Pt practiced sit<>stand X 4 with less assistance provided with each task.  Pt performed stand pivot transfer mat->w/c with min A.  Pt encouraged by therapy session.   Lavone Neri [facility] 07/24/2012, 2:47 PM

## 2012-07-24 NOTE — Progress Notes (Signed)
Patient information reviewed and entered into eRehab system by Christpoher Sievers, RN, CRRN, PPS Coordinator.  Information including medical coding and functional independence measure will be reviewed and updated through discharge.     Per nursing patient was given "Data Collection Information Summary for Patients in Inpatient Rehabilitation Facilities with attached "Privacy Act Statement-Health Care Records" upon admission.  

## 2012-07-24 NOTE — Evaluation (Signed)
Occupational Therapy Assessment and Plan & Session Note  Patient Details  Name: Laura Roberts MRN: [id number] Date of Birth: 05-24-1941  OT Diagnosis: abnormal posture and muscle weakness (generalized) Rehab Potential: Rehab Potential: Good ELOS: ~2 weeks   Today's Date: 07/24/2012  Problem List:  Patient Active Problem List  Diagnosis  . Multiple sclerosis  . SOB (shortness of breath)  . Weakness  . Bronchitis  . Multiple sclerosis exacerbation  . Cough  . HTN (hypertension)  . DM (diabetes mellitus)  . HLD (hyperlipidemia)  . Tachycardia  . GERD (gastroesophageal reflux disease)    Past Medical History:  Past Medical History  Diagnosis Date  . Hypertension   . Multiple sclerosis   . Diabetes mellitus without complication   . Neuromuscular disorder     Bilateral hand carpal tunnel syndrome   Past Surgical History:  Past Surgical History  Procedure Date  . Replacement total knee bilateral 2004  . Abdominal hysterectomy   . Cholecystectomy   . Back surgery   . Left hand carpal tunnel surgery     Clinical Impression: Laura Roberts is a 72 y.o. female with history of HTN, MS, SOB with URI symptoms for the past week with significant weakness LE with inability to walk. She was evaluated by neurology and sent to ED on 07/21/12 for treatment of MS exacerbation. She was evaluated by Dr. Leroy Kennedy and started on IV solumedrol due to question of MS flare up. Purulent bronchitis with Question of early PNA treated with antibiotic and supportive care. MRI spine with new spinal stenosis C4/5 with two small probably demyelinating plaques at C2/3 and C7-stable, severe multifactorial thoracic spinal stenosis with increased cord compression T 11-12 , diffuse lumbar spinal stenosis severe L3-S1 and moderate to severe distension of the bladder. Respiratory status has improved. Neurology recommends 5 total days IV steroids for MS flare. Has been seen by Dr. Jule Ser who recommends treating  MS flare and therapy with follow up on outpatient basis for input on need for surgery is she does not improve. Therapies ongoing and team recommending CIR. Patient admitted today for progressive therapy. Patient transferred to CIR on 07/23/2012.    Patient currently requires min-max assist with basic self-care skills, IADL and transfers secondary to muscle weakness, muscle joint tightness and muscle paralysis and decreased sitting balance, decreased standing balance, decreased postural control and decreased balance strategies.  Prior to hospitalization, patient could complete ADLs & IADLs independently.   Patient will benefit from skilled intervention to increase independence with basic self-care skills prior to discharge home independently.  Anticipate patient will require intermittent supervision and no further OT follow recommended.  OT - End of Session Activity Tolerance: Tolerates 10 - 20 min activity with multiple rests Endurance Deficit: Yes Endurance Deficit Description: generalized weakness and deconditioning.   OT Assessment Rehab Potential: Good Barriers to Discharge: None (none at this time, goals at mod I) OT Plan OT Intensity: Minimum of 1-2 x/day, 45 to 90 minutes OT Frequency: 5 out of 7 days OT Duration/Estimated Length of Stay: ~2 weeks OT Treatment/Interventions: Balance/vestibular training;Community reintegration;Discharge planning;DME/adaptive equipment instruction;Functional mobility training;Neuromuscular re-education;Pain management;Patient/family education;Psychosocial support;Self Care/advanced ADL retraining;Skin care/wound managment;Splinting/orthotics;Therapeutic Activities;Therapeutic Exercise;UE/LE Strength taining/ROM;UE/LE Coordination activities;Wheelchair propulsion/positioning OT Recommendation Patient destination: Home Follow Up Recommendations: None Equipment Details: Patient has needed DME/AE from OT standpoint  Precautions/Restrictions   Precautions Precautions: Fall Restrictions Weight Bearing Restrictions: No  General Chart Reviewed: Yes Family/Caregiver Present: No  Vital Signs Therapy Vitals Pulse Rate: 68  Oxygen Therapy  SpO2: 95 % O2 Device: None (Room air)  Pain Pain Assessment Pain Assessment: No/denies pain Pain Score: 0-No pain  Home Living/Prior Functioning Home Living Lives With: Alone Available Help at Discharge: Friend(s);Available PRN/intermittently Type of Home: House Home Access: Ramped entrance Home Layout: One level Bathroom Shower/Tub: Tub/shower unit;Curtain Bathroom Toilet: Handicapped height Bathroom Accessibility: Yes How Accessible: Accessible via walker;Accessible via wheelchair Home Adaptive Equipment: Bedside commode/3-in-1;Hand-held shower hose;Tub transfer bench;Walker - four wheeled;Shower chair with back;Grab bars in shower;Other (comment);Wheelchair - manual;Reacher;Sock aid (3 wheeled (triangular) walker) Additional Comments: Has been managing independently since being in rehab in 2007 IADL History Homemaking Responsibilities: Yes Meal Prep Responsibility: Primary Laundry Responsibility: Primary Cleaning Responsibility: Primary Bill Paying/Finance Responsibility: Primary Shopping Responsibility: Primary Current License: Yes Occupation: Retired 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 Leisure and Hobbies: crafts, grandson Prior Function Level of Independence: Independent with basic ADLs;Independent with homemaking with ambulation;Requires assistive device for independence Driving: Yes (only when necessary) Vocation: Retired Leisure: Hobbies-yes (Comment) Comments: new grandson likes to spend time with the grandson  ADL - See FIM  Vision/Perception  Vision - History Baseline Vision: Bifocals Visual History: Cataracts Patient Visual Report: No change from baseline Perception Perception: Within Functional Limits Praxis Praxis: Intact    Cognition Overall Cognitive Status: Appears within functional limits for tasks assessed Arousal/Alertness: Awake/alert Orientation Level: Oriented X4 Memory: Appears intact Awareness: Appears intact Problem Solving: Appears intact Safety/Judgment: Appears intact  Sensation Sensation Light Touch: Impaired by gross assessment Light Touch Impaired Details: Impaired LUE;Impaired RUE Stereognosis: Impaired by gross assessment Hot/Cold: Impaired by gross assessment Proprioception: Impaired by gross assessment Additional Comments: patient reports carpal tunnel in bilateral hands; right UE/LE weaker than left Coordination Gross Motor Movements are Fluid and Coordinated: Yes Fine Motor Movements are Fluid and Coordinated: Yes  Mobility  Bed Mobility Left Sidelying to Sit: 3: Mod assist;HOB elevated;With rails Left Sidelying to Sit Details (indicate cue type and reason): mod assist to help bil legs over EOB Sitting - Scoot to Edge of Bed: 4: Min assist;With rail Sitting - Scoot to [place] of Bed Details (indicate cue type and reason): min assist to help weight shift hips reciprocally to scoot Transfers Sit to Stand: 3: Mod assist;From elevated surface;With upper extremity assist;With armrests;From bed;From chair/3-in-1;From toilet Sit to Stand Details (indicate cue type and reason): mod assist to support trunk over weak legs and to block right knee for transition to standing.   Stand to Sit: 3: Mod assist;With upper extremity assist;With armrests;To elevated surface;To bed;To chair/3-in-1;To toilet Stand to Sit Details: mod assist to help lower slowly due to pt's right knee will unlock quickly and buckle   Trunk/Postural Assessment - See Evaluation Navigator   Balance- See Evaluation Navigator   Extremity/Trunk Assessment RUE Assessment RUE Assessment: Within Functional Limits (can benefit from UE strengthening ) LUE Assessment LUE Assessment: Within Functional Limits (can benefit  from UE strengthening )  See FIM for current functional status Refer to Care Plan for Long Term Goals  Recommendations for other services: None  Discharge Criteria: Patient will be discharged from OT if patient refuses treatment 3 consecutive times without medical reason, if treatment goals not met, if there is a change in medical status, if patient makes no progress towards goals or if patient is discharged from hospital.  The above assessment, treatment plan, treatment alternatives and goals were discussed and mutually agreed upon: by patient  --------------------------------------------------------------------------------------------------------------  SESSION NOTE  1030-1125 - 55 Minutes Individual Therapy No complaints of pain Initial 1:1  occupational therapy evaluation completed. Focused skilled intervention on bed mobility, dynamic sitting balance/tolerance/endurance, UB/LB dressing, sit/stands, scoot pivot transfer from edge of bed -> recliner, grooming tasks seated at sink in recliner, and overall activity tolerance/endurance. At end of session left patient seated in recliner beside bed with call bell & phone within reach.   Laura Roberts,Laura Roberts 07/24/2012, 11:38 AM

## 2012-07-24 NOTE — Plan of Care (Signed)
Problem: RH PAIN MANAGEMENT Goal: RH STG PAIN MANAGED AT OR BELOW PT'S PAIN GOAL 3 below  Outcome: Progressing Pain controlled with scheduled Neurotin 100mg 

## 2012-07-24 NOTE — Evaluation (Signed)
Physical Therapy Assessment and Plan  Patient Details  Name: Laura Roberts MRN: [id number] Date of Birth: 1941-02-17  PT Diagnosis: Abnormality of gait, Difficulty walking, Impaired sensation and Muscle weakness Rehab Potential: Good ELOS: 10-14 days   Today's Date: 07/24/2012 Time:Session #1:  4010-2725, Session #2: [id number] Time Calculation (min): Session #1: 67 min, Session #2: 34 MIN   Problem List:  Patient Active Problem List  Diagnosis  . Multiple sclerosis  . SOB (shortness of breath)  . Weakness  . Bronchitis  . Multiple sclerosis exacerbation  . Cough  . HTN (hypertension)  . DM (diabetes mellitus)  . HLD (hyperlipidemia)  . Tachycardia  . GERD (gastroesophageal reflux disease)    Past Medical History:  Past Medical History  Diagnosis Date  . Hypertension   . Multiple sclerosis   . Diabetes mellitus without complication   . Neuromuscular disorder     Bilateral hand carpal tunnel syndrome   Past Surgical History:  Past Surgical History  Procedure Date  . Replacement total knee bilateral 2004  . Abdominal hysterectomy   . Cholecystectomy   . Back surgery   . Left hand carpal tunnel surgery     Assessment & Plan Clinical Impression: Laura Roberts is a 72 y.o. female with history of HTN, MS, SOB with URI symptoms for the past week with significant weakness LE with inability to walk. She was evaluated by neurology and sent to ED on 07/21/12 for treatment of MS exacerbation. She was evaluated by Dr. Leroy Kennedy and started on IV solumedrol due to question of MS flare up. Purulent bronchitis with Question of early PNA treated with antibiotic and supportive care. MRI spine with new spinal stenosis C4/5 with two small probably demyelinating plaques at C2/3 and C7-stable, severe multifactorial thoracic spinal stenosis with increased cord compression T 11-12 , diffuse lumbar spinal stenosis severe L3-S1 and moderate to severe distension of the bladder.  Patient  transferred to CIR on 07/23/2012 .   Patient currently requires mod with mobility secondary to muscle weakness and impaired timing and sequencing, abnormal tone and unbalanced muscle activation.  Prior to hospitalization, patient was modified independent  with mobility and lived with Alone in a House home.  Home access is  Ramped entrance.  Patient will benefit from skilled PT intervention to maximize safe functional mobility and minimize fall risk for planned discharge home with intermittent assist.  Anticipate patient will benefit from follow up [facility] at discharge.  PT - End of Session Activity Tolerance: Tolerates 30+ min activity with multiple rests Endurance Deficit: Yes Endurance Deficit Description: generalized weakness and deconditioning.   PT Assessment Rehab Potential: Good Barriers to Discharge: Decreased caregiver support Barriers to Discharge Comments: No 24 hour assist, friends to stop by intermittently.  I don't know that she will need to be able to walk to go home safely, stand and transfers at mod I level.   PT Plan PT Intensity: Minimum of 2-3x/day, 45 to 90 minutes PT Frequency: 5 out of 7 days PT Duration Estimated Length of Stay: 10-14 days PT Treatment/Interventions: Ambulation/gait training;Balance/vestibular training;Community reintegration;Discharge planning;DME/adaptive equipment instruction;Functional electrical stimulation;Functional mobility training;Neuromuscular re-education;Patient/family education;Pain management;Splinting/orthotics;Therapeutic Activities;Therapeutic Exercise;UE/LE Strength taining/ROM;UE/LE Coordination activities;Wheelchair propulsion/positioning PT Recommendation Follow Up Recommendations: Home health PT Patient destination: Home Equipment Recommended: None recommended by PT Equipment Details: pt has a lot of equipment at home already  PT Evaluation Precautions/Restrictions Precautions Precautions: Fall Restrictions Weight Bearing  Restrictions: No General   Vital SignsTherapy Vitals Pulse Rate: 68  Oxygen Therapy  SpO2: 95 % O2 Device: None (Room air) Pain Pain Assessment Pain Assessment: No/denies pain Pain Score: 0-No pain Home Living/Prior Functioning Home Living Lives With: Alone Available Help at Discharge: Friend(s);Available PRN/intermittently Type of Home: House Home Access: Ramped entrance Home Layout: One level Bathroom Shower/Tub: Tub/shower unit;Curtain Bathroom Toilet: Handicapped height Bathroom Accessibility: Yes How Accessible: Accessible via walker;Accessible via wheelchair Home Adaptive Equipment: Bedside commode/3-in-1;Hand-held shower hose;Tub transfer bench;Walker - four wheeled;Shower chair with back;Grab bars in shower;Other (comment);Wheelchair - manual;Reacher;Sock aid (3 wheeled (triangular) walker) Additional Comments: Has been managing independently since being in rehab in 2007 Prior Function Level of Independence: Independent with basic ADLs;Independent with homemaking with ambulation;Requires assistive device for independence Driving: Yes (only when necessary) Vocation: Retired Leisure: Hobbies-yes (Comment) Comments: new grandson likes to spend time with the grandson Vision/Perception  Vision - History 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 History: Cataracts Patient Visual Report: No change from baseline Perception Perception: Within Functional Limits Praxis Praxis: Intact  Cognition Overall Cognitive Status: Appears within functional limits for tasks assessed Arousal/Alertness: Awake/alert Orientation Level: Oriented X4 Memory: Appears intact Awareness: Appears intact Problem Solving: Appears intact Safety/Judgment: Appears intact Sensation Sensation Light Touch: Impaired by gross assessment Light Touch Impaired Details: Impaired LUE;Impaired RUE Stereognosis: Impaired by gross assessment Hot/Cold: Impaired by gross assessment Proprioception: Impaired by gross  assessment Additional Comments: patient reports carpal tunnel in bilateral hands; right UE/LE weaker than left Coordination Gross Motor Movements are Fluid and Coordinated: Yes Fine Motor Movements are Fluid and Coordinated: Yes Motor     Mobility Bed Mobility Left Sidelying to Sit: 3: Mod assist;HOB elevated;With rails Left Sidelying to Sit Details (indicate cue type and reason): mod assist to help bil legs over EOB Sitting - Scoot to Edge of Bed: 4: Min assist;With rail Sitting - Scoot to [place] of Bed Details (indicate cue type and reason): min assist to help weight shift hips reciprocally to scoot Transfers Sit to Stand: 3: Mod assist;From elevated surface;With upper extremity assist;With armrests;From bed;From chair/3-in-1;From toilet Sit to Stand Details (indicate cue type and reason): mod assist to support trunk over weak legs and to block right knee for transition to standing.   Stand to Sit: 3: Mod assist;With upper extremity assist;With armrests;To elevated surface;To bed;To chair/3-in-1;To toilet Stand to Sit Details: mod assist to help lower slowly due to pt's right knee will unlock quickly and buckle Locomotion  Ambulation Ambulation: Yes Ambulation/Gait Assistance: 2: Max assist Ambulation Distance (Feet): 10 Feet Assistive device: Parallel bars Ambulation/Gait Assistance Details: blocking alternating knees and helping to prgress and place right foot during gait.  Support at trunk for balance and in case legs buckled.  Second person to follow closely with WC.   Gait Gait Pattern: Step-to pattern;Right steppage;Right genu recurvatum;Shuffle;Decreased dorsiflexion - right;Scissoring;Narrow base of support Gait velocity: less than 1.8 ft/sec which indiactes risk for recurrent falls.   Corporate treasurer: Yes Wheelchair Assistance: 6: Modified independent (Device/Increase time) Occupational hygienist: Both upper extremities Wheelchair Parts Management:  Needs assistance Distance: 150  Trunk/Postural Counsellor Sitting - Balance Support: Bilateral upper extremity supported;Feet supported Static Sitting - Level of Assistance: 5: Stand by assistance Static Sitting - Comment/# of Minutes: EOB 2-3 mins in preparation for standing and during MMT.  No LOB. Static Standing Balance Static Standing - Balance Support: Bilateral upper extremity supported Static Standing - Level of Assistance: 4: Min assist Static Standing - Comment/# of Minutes: 2 mins while therapist is adjusting depends after tolieting.  Extremity Assessment  RUE Assessment RUE Assessment: Within Functional Limits (can benefit from UE strengthening ) LUE Assessment LUE Assessment: Within Functional Limits (can benefit from UE strengthening ) RLE Strength RLE Overall Strength Comments: h/o R ankle fx and R TKA, trace ankle DF, 2/5 ankle PF, 3+/5 knee ext 3-/5 knee flexion, 2+/5 hip flexion per gross functional MMT in sitting EOB. Has AFO for right foot, but not currently here-will have someone bring it.   LLE Strength LLE Overall Strength Comments: h/o L TKA, ankle DF 3-/5, PF 4/5, knee ext 3+/5, knee flexion 3+/5, hip flexion 3+/5 per gross MMT EOB  FIM:  FIM - Bed/Chair Transfer Bed/Chair Transfer Assistive Devices: Bed rails;Arm rests Bed/Chair Transfer: 3: Supine > Sit: Mod A (lifting assist/Pt. 50-74%/lift 2 legs;3: Bed > Chair or W/C: Mod A (lift or lower assist);3: Chair or W/C > Bed: Mod A (lift or lower assist) FIM - Locomotion: Wheelchair Distance: 150 Locomotion: Wheelchair: 6: Travels 150 ft or more, turns around, maneuvers to table, bed or toilet, negotiates 3% grade: maneuvers on rugs and over door sills independently FIM - Locomotion: Ambulation Locomotion: Ambulation Assistive Devices: Parallel bars Ambulation/Gait Assistance: 2: Max assist Locomotion: Ambulation: 1: Travels less than 50 ft with maximal assistance (Pt:  25 - 49%) FIM - Locomotion: Stairs Locomotion: Stairs: 0: Activity did not occur   Refer to Care Plan for Long Term Goals  Recommendations for other services: None  Discharge Criteria: Patient will be discharged from PT if patient refuses treatment 3 consecutive times without medical reason, if treatment goals not met, if there is a change in medical status, if patient makes no progress towards goals or if patient is discharged from hospital.  The above assessment, treatment plan, treatment alternatives and goals were discussed and mutually agreed upon: by patient  Skilled Interventions: Session #1: See eval note. Session #2: This session focused on transfers to and from [facility] using Rollator mod assist both into and out of WC.  Bed mobility using a sheet and then a leg lifter to help pt lift right leg into and out of bed min assist to get in and supervision to get out of bed.  Toilet transfer min assist using grab bar and WC to recliner transfer mod assist with no assistive device.  Pt able to perform own peri care on toilet, but needed some assist with her pants and depends once standing.     Rollene Rotunda Almarie Kurdziel, PT, DPT [phone]   07/24/2012, 11:23 AM

## 2012-07-25 ENCOUNTER — Inpatient Hospital Stay (HOSPITAL_COMMUNITY): Payer: Medicare Other | Admitting: Occupational Therapy

## 2012-07-25 ENCOUNTER — Inpatient Hospital Stay (HOSPITAL_COMMUNITY): Payer: Medicare Other | Admitting: Physical Therapy

## 2012-07-25 LAB — GLUCOSE, CAPILLARY
Glucose-Capillary: 161 mg/dL — ABNORMAL HIGH (ref 70–99)
Glucose-Capillary: 205 mg/dL — ABNORMAL HIGH (ref 70–99)
Glucose-Capillary: 297 mg/dL — ABNORMAL HIGH (ref 70–99)

## 2012-07-25 NOTE — Progress Notes (Signed)
Physical Therapy Session Note  Patient Details  Name: Laura Roberts MRN: [id number] Date of Birth: [birth year]  Today's Date: 07/25/2012 Time: 0802-0901 Time Calculation (min): 59 min  Short Term Goals: Week 1:  PT Short Term Goal 1 (Week 1): bed mobility supervision using assistive device to manage right leg.   PT Short Term Goal 2 (Week 1): transfers to both right and left side stand or squat pivot min assist PT Short Term Goal 3 (Week 1): gait with rollator mod assist with R AFO.   PT Short Term Goal 4 (Week 1): WC mobility >200' for endurance training mod I  PT Short Term Goal 5 (Week 1): Dynamic standing balance during functional task like toileting (managing pants and underwear) or washing hands at sink min assist.    Skilled Therapeutic Interventions/Progress Updates:   Supine to sit with mod@ for bilateral LEs.  Sitting EOB with mod I.  Transfer to w/c, stand pivot with mod@, toilet transfer with rails with mod@.  Pt needed assist to donn/doff clothing.  Wearing posterior leaf AFO on R.  Sit to stand from w/c with min@.  Gait training with RW, 6', 12', & 14',  with mod@, hyperextends L knee in stance and needs assist to advance the RLE through swing phase.  Propelled w/c in bathroom and on unit x 150' with supervision.  Therapy Documentation Precautions:  Precautions Precautions: Fall Restrictions Weight Bearing Restrictions: No  Pain:  No pain See FIM for current functional status  Therapy/Group: Individual Therapy  [name] 07/25/2012, 9:38 AM

## 2012-07-25 NOTE — Progress Notes (Signed)
Occupational Therapy Session Note  Patient Details  Name: Laura Roberts MRN: [id number] Date of Birth: [birth year]  Today's Date: 07/25/2012 Time: 1000-1125 Time Calculation (min): 85 min  Short Term Goals: Week 1:  OT Short Term Goal 1 (Week 1): Patient will complete tub/shower transfer with minimal assistance OT Short Term Goal 2 (Week 1): Patient will complete UB/LB bathing with supervision using AE prn OT Short Term Goal 3 (Week 1): Patient will complete LB dressing with min assist using AE prn OT Short Term Goal 4 (Week 1): Patient will complete toilet transfer with supervision using DME prn  Skilled Therapeutic Interventions/Progress Updates:  Patient found seated in w/c upon entering room. Patient with no complaints of pain, but complaints of some discolor in right fingertips; notified RN. 02 sats taken from right ring finger =98% HR =71. From room patient propelled self -> ADL apartment for ADL retraining at shower level. Patient parked w/c outside bathroom door and ambulated into bathroom for tub/shower transfer onto tub transfer bench (mod assist for ambulation using rolling walker). Patient requires bilateral shoes and AFO in right shoe to be donned in order to safely & effectively ambulate with rolling walker. From shower patient ambulated with shoes and AFO donned -> elevated toilet seat for toilet transfer and toileting. From toilet seat patient ambulated-> edge of bed for UB/LB dressing. Focused skilled intervention on overall activity tolerance/endurance while incorporating important energy conservation techniques prn, functional mobility/ambulation with use of rolling walker, UB/LB bathing & dressing, ADL transfers onto tub transfer bench & elevated toilet seat, sit/stands, and dynamic standing balance/tolerance/endurance. Therapist propelled patient back to room and left her seated in recliner with BLEs elevated and call bell & phone within reach.   Precautions:   Precautions Precautions: Fall Restrictions Weight Bearing Restrictions: No  See FIM for current functional status  Therapy/Group: Individual Therapy  Bobbyjoe Pabst,[place] 07/25/2012, 11:28 AM

## 2012-07-25 NOTE — Progress Notes (Signed)
Subjective/Complaints: Still coughing somewhat. Had a good day with therapy. Showed me her afo (right) A 12 point review of systems has been performed and if not noted above is otherwise negative.   Objective: Vital Signs: Blood pressure 135/80, pulse 80, temperature 98 F (36.7 C), temperature source Oral, resp. rate 18, height 5\' 2"  (1.575 m), weight 91 kg (200 lb 9.9 oz), SpO2 100.00%. Dg Chest 2 View  07/24/2012  *RADIOLOGY REPORT*  Clinical Data: Cough.  Leukocytosis.  CHEST - 2 VIEW  Comparison: 07/21/2012  Findings: Both lateral views are degraded by patient arm position. Moderate lower thoracic spondylosis.  Apical lordotic patient positioning on the frontal.  Heart size upper normal, with a tortuous descending thoracic aorta. No pleural effusion or pneumothorax.  No congestive failure.  Clear lungs.  IMPRESSION: Borderline cardiomegaly, without acute disease.   Original Report Authenticated By: Jeronimo Greaves, M.D.    Ct Lumbar Spine Wo Contrast  07/23/2012  *RADIOLOGY REPORT*  Clinical Data: Evaluate spondylolisthesis and stenosis at the T11- 12 level. Back pain with bilateral leg weakness.  CT THORACIC SPINE WITHOUT CONTRAST,CT LUMBAR SPINE WITHOUT CONTRAST  Technique:  Multidetector CT imaging of the thoracic and lumbar spine was performed without intravenous contrast.  Comparison: MRI thoracic spine 07/22/2012. Prior MRI from 01/03/2011.  Findings: The scan extends from T6-7 through the mid sacrum.  At the thoracic levels from T7-8 through T10-11, there is mild facet arthropathy and slight vertebral body spurring, but no visible compressive lesion.  There is 3 mm of anterolisthesis T11-12 with marked vacuum phenomenon. The endplates show marked subchondral cystic formation and sclerosis.  Marked extraforaminal spurring is greater on the right.  There is advanced facet arthropathy of the T11-T12 facet complex with joint space narrowing and subchondral cyst formation. Canal diameter estimated be  4 mm as measured on image 46 series 603.  Previous central laminectomy at this level performed in 2007. Moderate cord compression is better demonstrated on MR, as the central disc protrusion is poorly visualized.  At T12-L1, there is vacuum disc phenomenon with mild bulge.  Wide central laminectomies are present without significant facet arthropathy.  At L1-2, the disc space is significantly narrowed there is moderate osteophytic spurring on the left with facet arthropathy but no definite intraspinal lesion.  Advanced vacuum phenomenon is noted at L2-3, L3-4, and L4-5 with osteophytic spurring in potentially symptomatic left sided nerve root encroachment particularly at L2-3  At L3-4, there is moderate to severe stenosis related to disc osteophyte complex greater on the left, and advanced facet arthropathy.  Marked vacuum disc phenomenon.  Left greater than right L4 and L3 nerve root encroachment.  At L4-L5 there is moderate multifactorial spinal stenosis related to central protrusion and posterior element hypertrophy.  At L5-S1, there is a central protrusion with advanced facet and ligamentum flavum hypertrophy.  Moderate to severe stenosis is present at this level.  Bilateral neural foraminal narrowing is also appreciated.  Compared with prior MRI from 2012, there is increased anterolisthesis and cord compression.  IMPRESSION:  3 mm anterolisthesis T11-12 with marked vacuum joint phenomenon. Advanced bony overgrowth including facet degenerative changes. Prior central laminectomy.  Central protrusion T11-12  better demonstrated on MR.  Advanced facet arthropathy at this level contributes to spinal stenosis, estimated to be 4 mm.  Multilevel lumbar spondylosis as described.   Original Report Authenticated By: Davonna Belling, M.D.    Ct [facility] Wo Or W/ Cm  07/23/2012  *RADIOLOGY REPORT*  Clinical Data: Evaluate spondylolisthesis  and stenosis at the T11- 12 level. Back pain with bilateral leg weakness.  CT  THORACIC SPINE WITHOUT CONTRAST,CT LUMBAR SPINE WITHOUT CONTRAST  Technique:  Multidetector CT imaging of the thoracic and lumbar spine was performed without intravenous contrast.  Comparison: MRI thoracic spine 07/22/2012. Prior MRI from 01/03/2011.  Findings: The scan extends from T6-7 through the mid sacrum.  At the thoracic levels from T7-8 through T10-11, there is mild facet arthropathy and slight vertebral body spurring, but no visible compressive lesion.  There is 3 mm of anterolisthesis T11-12 with marked vacuum phenomenon. The endplates show marked subchondral cystic formation and sclerosis.  Marked extraforaminal spurring is greater on the right.  There is advanced facet arthropathy of the T11-T12 facet complex with joint space narrowing and subchondral cyst formation. Canal diameter estimated be 4 mm as measured on image 46 series 603.  Previous central laminectomy at this level performed in 2007. Moderate cord compression is better demonstrated on MR, as the central disc protrusion is poorly visualized.  At T12-L1, there is vacuum disc phenomenon with mild bulge.  Wide central laminectomies are present without significant facet arthropathy.  At L1-2, the disc space is significantly narrowed there is moderate osteophytic spurring on the left with facet arthropathy but no definite intraspinal lesion.  Advanced vacuum phenomenon is noted at L2-3, L3-4, and L4-5 with osteophytic spurring in potentially symptomatic left sided nerve root encroachment particularly at L2-3  At L3-4, there is moderate to severe stenosis related to disc osteophyte complex greater on the left, and advanced facet arthropathy.  Marked vacuum disc phenomenon.  Left greater than right L4 and L3 nerve root encroachment.  At L4-L5 there is moderate multifactorial spinal stenosis related to central protrusion and posterior element hypertrophy.  At L5-S1, there is a central protrusion with advanced facet and ligamentum flavum hypertrophy.   Moderate to severe stenosis is present at this level.  Bilateral neural foraminal narrowing is also appreciated.  Compared with prior MRI from 2012, there is increased anterolisthesis and cord compression.  IMPRESSION:  3 mm anterolisthesis T11-12 with marked vacuum joint phenomenon. Advanced bony overgrowth including facet degenerative changes. Prior central laminectomy.  Central protrusion T11-12  better demonstrated on MR.  Advanced facet arthropathy at this level contributes to spinal stenosis, estimated to be 4 mm.  Multilevel lumbar spondylosis as described.   Original Report Authenticated By: Davonna Belling, M.D.     Basename 07/23/12 0940  WBC 23.1*  HGB 13.3  HCT 39.3  PLT 293   No results found for this basename: NA:2,K:2,CL:2,CO:2,GLUCOSE:2,BUN:2,CREATININE:2,CALCIUM:2 in the last 72 hours CBG (last 3)   Basename 07/24/12 2138 07/24/12 1639 07/24/12 1128  GLUCAP 279* 170* 166*    Wt Readings from Last 3 Encounters:  07/23/12 91 kg (200 lb 9.9 oz)  07/22/12 91.2 kg (201 lb 1 oz)    Physical Exam:  Constitutional: She is oriented to person, place, and time. She appears well-developed and well-nourished.  HENT:  Head: Normocephalic.  Left Ear: External ear normal.  Eyes: Pupils are equal, round, and reactive to light.  Neck: Normal range of motion.  Cardiovascular: Normal rate and regular rhythm.  Pulmonary/Chest: She is CTA bilaterally. No distress Neurological: She is alert and oriented to person, place, and time.  RLE with impaired proprioception At the toes. Decreased sensation to LT below the knees. DTR's 2++ Skin: Skin is warm and dry.  Motor strength is 4/5 in bilateral deltoid, biceps, triceps, grip  2 minus in the left hip flexors  3 minus knee extensors, right HF 1+, KF 1+ to 2;  1/5 in the right ankle dorsiflexor plantar flexor and 2+ to 3-/5 in the left ankle dorsiflexor plantar flexor    Assessment/Plan: 1. Functional deficits secondary to multiple sclerosis with  neurogenic bowel and bladder and thoracic myelopathy which require 3+ hours per day of interdisciplinary therapy in a comprehensive inpatient rehab setting. Physiatrist is providing close team supervision and 24 hour management of active medical problems listed below. Physiatrist and rehab team continue to assess barriers to discharge/monitor patient progress toward functional and medical goals. FIM: FIM - Bathing Bathing: 0: Activity did not occur  FIM - Upper Body Dressing/Undressing Upper body dressing/undressing steps patient completed: Thread/unthread right sleeve of pullover shirt/dresss;Thread/unthread left sleeve of pullover shirt/dress;Put head through opening of pull over shirt/dress;Pull shirt over trunk Upper body dressing/undressing: 4: Steadying assist FIM - Lower Body Dressing/Undressing Lower body dressing/undressing steps patient completed: Thread/unthread right pants leg;Thread/unthread left pants leg;Don/Doff right sock;Don/Doff left sock Lower body dressing/undressing: 2: Max-Patient completed 25-49% of tasks  FIM - Toileting Toileting: 0: Activity did not occur  FIM - Diplomatic Services operational officer Devices: Grab bars Toilet Transfers: 0-Activity did not occur  FIM - Banker Devices: HOB elevated;Bed rails;Walker Bed/Chair Transfer: 4: Chair or W/C > Bed: Min A (steadying Pt. > 75%)  FIM - Locomotion: Wheelchair Distance: 150 Locomotion: Wheelchair: 6: Travels 150 ft or more, turns around, maneuvers to table, bed or toilet, negotiates 3% grade: maneuvers on rugs and over door sills independently FIM - Locomotion: Ambulation Locomotion: Ambulation Assistive Devices: Parallel bars Ambulation/Gait Assistance: 2: Max assist Locomotion: Ambulation: 1: Travels less than 50 ft with maximal assistance (Pt: 25 - 49%)  Comprehension Comprehension Mode: Auditory Comprehension: 6-Follows complex conversation/direction:  With extra time/assistive device  Expression Expression Mode: Verbal Expression: 6-Expresses complex ideas: With extra time/assistive device  Social Interaction Social Interaction: 6-Interacts appropriately with others with medication or extra time (anti-anxiety, antidepressant).  Problem Solving Problem Solving: 6-Solves complex problems: With extra time  Memory Memory: 6-More than reasonable amt of time  Medical Problem List and Plan:  1. DVT Prophylaxis/Anticoagulation: Pharmaceutical: Lovenox  2. Pain Management: Will continue prn morphine for back pain from coughing spells.  3. Mood: Reporting anxiety causing insomnia. Ego support provided. Continue cymbalta daily.  4. Neuropsych: This patient is capable of making decisions on his/her own behalf.  5. DM type 2: continue AC/HS CBG checks. BS uncontrolled due to steroids on board. Continue Amaryl and canagliflozin. Will continue lantus with novolog till off steroids.  6. MS flare: IV solumedrol to prednisone taper.  Continue rebiff 3 X week.  7. Bronchitis with likely PNA: PO Levaquin to po route D43/7. Schedule anti-tussives. Use nebs prn SOB/wheezing. IS 8. Leukocytosis: likely reactive due to PNA as well as steroid effect. No fevers noted and patient clinically looks better. Will monitor for symptoms of infection. CXR without disease 9. HTN: continue norvasc, avapro and atenolol daily. Monitor BP on bid basis.  10. Neurogenic bladder:     nursing toilet every 4 hours and scan to check bladder volumes. UCS 01/21 without growth.  LOS (Days) 2 A FACE TO FACE EVALUATION WAS PERFORMED  Laura Roberts 07/25/2012 7:44 AM

## 2012-07-25 NOTE — Progress Notes (Signed)
Physical Therapy Session Note  Patient Details  Name: Laura Roberts MRN: [id number] Date of Birth: September 03, 1940  Today's Date: 07/25/2012 Time: 1300-1330 Time Calculation (min): 30 min  Short Term Goals: Week 1:  PT Short Term Goal 1 (Week 1): bed mobility supervision using assistive device to manage right leg.   PT Short Term Goal 2 (Week 1): transfers to both right and left side stand or squat pivot min assist PT Short Term Goal 3 (Week 1): gait with rollator mod assist with R AFO.   PT Short Term Goal 4 (Week 1): WC mobility >200' for endurance training mod I  PT Short Term Goal 5 (Week 1): Dynamic standing balance during functional task like toileting (managing pants and underwear) or washing hands at sink min assist.    Therapy Documentation Precautions:  Precautions: Fall Restrictions Weight Bearing Restrictions: No Pain:  Pain Assessment: No/denies pain  Therapeutic Activity:(15') Transfer training sit<->stand with multiple repetitions with Mod-A Therapeutic Exercise:(15') mini squats with Mod-assist to block knees from buckling   See FIM for current functional status  Therapy/Group: Individual Therapy  Jabari Swoveland J 07/25/2012, 1:01 PM

## 2012-07-26 ENCOUNTER — Inpatient Hospital Stay (HOSPITAL_COMMUNITY): Payer: Medicare Other | Admitting: *Deleted

## 2012-07-26 LAB — GLUCOSE, CAPILLARY
Glucose-Capillary: 154 mg/dL — ABNORMAL HIGH (ref 70–99)
Glucose-Capillary: 221 mg/dL — ABNORMAL HIGH (ref 70–99)
Glucose-Capillary: 231 mg/dL — ABNORMAL HIGH (ref 70–99)

## 2012-07-26 MED ORDER — IPRATROPIUM BROMIDE 0.02 % IN SOLN: 0.5000 mg | Freq: Four times a day (QID) | RESPIRATORY_TRACT | Status: DC | PRN

## 2012-07-26 MED ORDER — IPRATROPIUM BROMIDE 0.02 % IN SOLN
0.5000 mg | Freq: Four times a day (QID) | RESPIRATORY_TRACT | Status: DC | PRN
  Administered 2012-07-26: 0.5 mg via RESPIRATORY_TRACT
  Filled 2012-07-26: qty 2.5

## 2012-07-26 MED ORDER — ALBUTEROL SULFATE (5 MG/ML) 0.5% IN NEBU: 2.5000 mg | INHALATION_SOLUTION | Freq: Four times a day (QID) | RESPIRATORY_TRACT | Status: DC

## 2012-07-26 MED ORDER — OXYBUTYNIN CHLORIDE 5 MG PO TABS
5.0000 mg | ORAL_TABLET | Freq: Two times a day (BID) | ORAL | Status: DC
  Administered 2012-07-26 – 2012-07-27 (×4): 5 mg via ORAL
  Filled 2012-07-26 (×8): qty 1

## 2012-07-26 MED ORDER — ALBUTEROL SULFATE (5 MG/ML) 0.5% IN NEBU
2.5000 mg | INHALATION_SOLUTION | Freq: Four times a day (QID) | RESPIRATORY_TRACT | Status: DC | PRN
  Filled 2012-07-26: qty 0.5

## 2012-07-26 MED ORDER — INSULIN GLARGINE 100 UNIT/ML ~~LOC~~ SOLN
20.0000 [IU] | Freq: Every day | SUBCUTANEOUS | Status: DC
  Administered 2012-07-26 – 2012-07-28 (×3): 20 [IU] via SUBCUTANEOUS

## 2012-07-26 NOTE — Progress Notes (Signed)
Physical Therapy Session Note  Patient Details  Name: Laura Roberts MRN: [id number] Date of Birth: 1940-10-26  Today's Date: 07/26/2012 Time: 0800-0845 Time Calculation (min): 45 min  Therapy Documentation Precautions:  Precautions Precautions: Fall Restrictions Weight Bearing Restrictions: No Vital Signs: Therapy Vitals Temp: 97.9 F (36.6 C) Temp src: Oral Pulse Rate: 72  Resp: 18  BP: 142/83 mmHg Patient Position, if appropriate: Lying Oxygen Therapy SpO2: 95 % O2 Device: None (Room air)  Therapeutic Activities:Training in supine to sit transfers min A,patient uses AD to help get her R LE off the bed, needs verbal reminder to use it. Sitting EOB CGA, transfer from bed to w/c -mod A, w /c to commode max A. Patient assisted in the bthroom with verbal cues for safety and max A for dressing. Patient is very scared of falling, tries to have therapist do all ADL's for her, when cued she was able to perform them with modA. W/C mobility training MI, >150 feet, propels herself with B UE. Gait training: pre gait activities :sit to stand pulling on parallel bars with minA, static standing balance training.  Gait training, L AFO, mod A in parallel bars, x 2.Patient present with hyperextension of L Knee.  See FIM for current functional status  Therapy/Group: Individual Therapy  Dorna Mai 07/26/2012, 9:37 AM

## 2012-07-26 NOTE — Progress Notes (Signed)
Subjective/Complaints: Urinary frequency, worsened by cough. Otherwise feeling well. Used afo yesterday A 12 point review of systems has been performed and if not noted above is otherwise negative.   Objective: Vital Signs: Blood pressure 142/83, pulse 72, temperature 97.9 F (36.6 C), temperature source Oral, resp. rate 18, height 5\' 2"  (1.575 m), weight 91 kg (200 lb 9.9 oz), SpO2 95.00%. Dg Chest 2 View  07/24/2012  *RADIOLOGY REPORT*  Clinical Data: Cough.  Leukocytosis.  CHEST - 2 VIEW  Comparison: 07/21/2012  Findings: Both lateral views are degraded by patient arm position. Moderate lower thoracic spondylosis.  Apical lordotic patient positioning on the frontal.  Heart size upper normal, with a tortuous descending thoracic aorta. No pleural effusion or pneumothorax.  No congestive failure.  Clear lungs.  IMPRESSION: Borderline cardiomegaly, without acute disease.   Original Report Authenticated By: Jeronimo Greaves, M.D.     Basename 07/23/12 0940  WBC 23.1*  HGB 13.3  HCT 39.3  PLT 293   No results found for this basename: NA:2,K:2,CL:2,CO:2,GLUCOSE:2,BUN:2,CREATININE:2,CALCIUM:2 in the last 72 hours CBG (last 3)   Basename 07/25/12 2018 07/25/12 1704 07/25/12 1139  GLUCAP 297* 263* 205*    Wt Readings from Last 3 Encounters:  07/23/12 91 kg (200 lb 9.9 oz)  07/22/12 91.2 kg (201 lb 1 oz)    Physical Exam:  Constitutional: She is oriented to person, place, and time. She appears well-developed and well-nourished.  HENT:  Head: Normocephalic.  Left Ear: External ear normal.  Eyes: Pupils are equal, round, and reactive to light.  Neck: Normal range of motion.  Cardiovascular: Normal rate and regular rhythm.  Pulmonary/Chest: She is CTA bilaterally. No distress Neurological: She is alert and oriented to person, place, and time.  RLE with impaired proprioception At the toes. Decreased sensation to LT below the knees. DTR's 2++ Skin: Skin is warm and dry.  Motor strength is  4/5 in bilateral deltoid, biceps, triceps, grip  2 minus in the left hip flexors 3 minus knee extensors, right HF 1+, KF 1+ to 2;  1/5 in the right ankle dorsiflexor plantar flexor and 2+ to 3-/5 in the left ankle dorsiflexor plantar flexor    Assessment/Plan: 1. Functional deficits secondary to multiple sclerosis with neurogenic bowel and bladder and thoracic myelopathy which require 3+ hours per day of interdisciplinary therapy in a comprehensive inpatient rehab setting. Physiatrist is providing close team supervision and 24 hour management of active medical problems listed below. Physiatrist and rehab team continue to assess barriers to discharge/monitor patient progress toward functional and medical goals. FIM: FIM - Bathing Bathing Steps Patient Completed: Chest;Right Arm;Left Arm;Abdomen;Front perineal area;Right upper leg;Buttocks;Left upper leg;Right lower leg (including foot);Left lower leg (including foot) Bathing: 5: Supervision: Safety issues/verbal cues  FIM - Upper Body Dressing/Undressing Upper body dressing/undressing steps patient completed: Thread/unthread right sleeve of pullover shirt/dresss;Thread/unthread left sleeve of pullover shirt/dress;Put head through opening of pull over shirt/dress;Pull shirt over trunk Upper body dressing/undressing: 5: Supervision: Safety issues/verbal cues FIM - Lower Body Dressing/Undressing Lower body dressing/undressing steps patient completed: Thread/unthread right pants leg;Thread/unthread left pants leg;Pull pants up/down Lower body dressing/undressing: 2: Max-Patient completed 25-49% of tasks  FIM - Toileting Toileting steps completed by patient: Adjust clothing prior to toileting;Performs perineal hygiene;Adjust clothing after toileting Toileting: 4: Steadying assist  FIM - Archivist Transfers Assistive Devices: Elevated toilet seat;Grab bars;Walker Toilet Transfers: 4-To toilet/BSC: Min A (steadying Pt. > 75%);4-From  toilet/BSC: Min A (steadying Pt. > 75%)  FIM - Press photographer  Assistive Devices: HOB elevated;Bed rails;Walker Bed/Chair Transfer: 3: Supine > Sit: Mod A (lifting assist/Pt. 50-74%/lift 2 legs;3: Bed > Chair or W/C: Mod A (lift or lower assist)  FIM - Locomotion: Wheelchair Distance: 150 Locomotion: Wheelchair: 5: Travels 150 ft or more: maneuvers on rugs and over door sills with supervision, cueing or coaxing FIM - Locomotion: Ambulation Locomotion: Ambulation Assistive Devices: Designer, industrial/product Ambulation/Gait Assistance: 3: Mod assist Locomotion: Ambulation: 1: Travels less than 50 ft with moderate assistance (Pt: 50 - 74%)  Comprehension Comprehension Mode: Auditory Comprehension: 6-Follows complex conversation/direction: With extra time/assistive device  Expression Expression Mode: Verbal Expression: 6-Expresses complex ideas: With extra time/assistive device  Social Interaction Social Interaction: 6-Interacts appropriately with others with medication or extra time (anti-anxiety, antidepressant).  Problem Solving Problem Solving: 6-Solves complex problems: With extra time  Memory Memory: 6-More than reasonable amt of time  Medical Problem List and Plan:  1. DVT Prophylaxis/Anticoagulation: Pharmaceutical: Lovenox  2. Pain Management: Will continue prn morphine for back pain from coughing spells.  3. Mood: Reporting anxiety causing insomnia. Ego support provided. Continue cymbalta daily.  4. Neuropsych: This patient is capable of making decisions on his/her own behalf.  5. DM type 2: continue AC/HS CBG checks. BS uncontrolled due to steroids on board. Continue Amaryl and canagliflozin. Will increase lantus temporarily 6. MS flare: IV solumedrol to prednisone taper.  Continue rebiff 3 X week.  7. Bronchitis with likely PNA: PO Levaquin to po route D43/7. Schedule anti-tussives. Use nebs prn SOB/wheezing. IS 8. Leukocytosis: likely reactive due to PNA as  well as steroid effect. No fevers noted and patient clinically looks better. Will monitor for symptoms of infection. CXR without disease 9. HTN: continue norvasc, avapro and atenolol daily. Monitor BP on bid basis.  10. Neurogenic bladder:     nursing toilet every 4 hours and scan to check bladder volumes. UCS 01/21 without growth.  -persistent, baseline urinary frequency. Will add ditropan 5 bid  LOS (Days) 3 A FACE TO FACE EVALUATION WAS PERFORMED  SWARTZ,ZACHARY T 07/26/2012 7:33 AM

## 2012-07-27 ENCOUNTER — Inpatient Hospital Stay (HOSPITAL_COMMUNITY): Payer: Medicare Other

## 2012-07-27 ENCOUNTER — Inpatient Hospital Stay (HOSPITAL_COMMUNITY): Payer: Medicare Other | Admitting: Occupational Therapy

## 2012-07-27 ENCOUNTER — Inpatient Hospital Stay (HOSPITAL_COMMUNITY): Payer: Medicare Other | Admitting: Physical Therapy

## 2012-07-27 DIAGNOSIS — G35 Multiple sclerosis: Secondary | ICD-10-CM

## 2012-07-27 LAB — GLUCOSE, CAPILLARY
Glucose-Capillary: 80 mg/dL (ref 70–99)
Glucose-Capillary: 343 mg/dL — ABNORMAL HIGH (ref 70–99)

## 2012-07-27 NOTE — Progress Notes (Signed)
Occupational Therapy Note  Patient Details  Name: Laura Roberts MRN: [id number] Date of Birth: 04-04-1941 Today's Date: 07/27/2012  Time  In:  0900  Time Out: 0915.  Individual session, no complaints of pain.  Patient to be seen by female OT and needed to toilet.  Patient did not want female OT to assist her with toileting.  This therapist provided brief treatment session with focus on wheelchair set up, toilet transfers using grab bar and 3 in 1 over toilet, sit to stand, standing balance, stepping and weight shifting and safety.  Patient is more independent and safe if she transfers to surface first, then stands a second time for clothes management.  Patient requires increased time as again she is more independent if she initiates weight shift and stepping.   Norton Pastel 07/27/2012, 11:23 AM

## 2012-07-27 NOTE — Plan of Care (Signed)
Problem: RH SAFETY Goal: RH STG ADHERE TO SAFETY PRECAUTIONS W/ASSISTANCE/DEVICE STG Adhere to Safety Precautions With Assistance/Device. Supervision  Outcome: Progressing Calls appropriately.     

## 2012-07-27 NOTE — Progress Notes (Signed)
Occupational Therapy Session Note  Patient Details  Name: Laura Roberts MRN: [id number] Date of Birth: [birth year]  Today's Date: 07/27/2012 Time: 1300-1330 Time Calculation (min): 30 min  Short Term Goals: Week 1:  OT Short Term Goal 1 (Week 1): Patient will complete tub/shower transfer with minimal assistance OT Short Term Goal 2 (Week 1): Patient will complete UB/LB bathing with supervision using AE prn OT Short Term Goal 3 (Week 1): Patient will complete LB dressing with min assist using AE prn OT Short Term Goal 4 (Week 1): Patient will complete toilet transfer with supervision using DME prn  Skilled Therapeutic Interventions/Progress Updates:  Pt. In room in wheelchair upon arrival. Pt. propelled herself to ADL apartment in wheelchair to work on tub/shower and bed transfer. Pt. practiced with positioning wheelchair requiring mod instructional cue and education on nplacement of the wheelchair for a safe transfer, hand placement, and on use of foot/leg lift. Pt. Required steady assist for safety. Pt. Had difficulty going form wheelchair to bed needing min A, and report that her "right knee locked up" making it difficult to get both legs in bed. Pt. verbally cued on hand placement going from bed to wheelchair. Therapy Documentation Precautions:  Precautions Precautions: Fall Restrictions Weight Bearing Restrictions: No Pain: Pain Assessment Pain Assessment: No/denies pain   Therapy/Group: Individual Therapy  Alawna Graybeal OTA/S Occupational Therapy Assistant Student 07/27/2012, 3:05 PM

## 2012-07-27 NOTE — Plan of Care (Signed)
Problem: SCI BLADDER ELIMINATION Goal: RH STG MANAGE BLADDER WITH ASSISTANCE STG Manage Bladder With Min Assist with timed toileting  Outcome: Not Progressing Hx of stress incontinence which persists

## 2012-07-27 NOTE — Plan of Care (Signed)
Problem: RH SAFETY Goal: RH STG DECREASED RISK OF FALL WITH ASSISTANCE STG Decreased Risk of Fall With [employer].  Outcome: Progressing With extra time

## 2012-07-27 NOTE — Progress Notes (Signed)
Subjective/Complaints: Urinary frequency better with ditropan. Still has cough. A 12 point review of systems has been performed and if not noted above is otherwise negative.   Objective: Vital Signs: Blood pressure 130/80, pulse 67, temperature 98 F (36.7 C), temperature source Oral, resp. rate 18, height 5\' 2"  (1.575 m), weight 91 kg (200 lb 9.9 oz), SpO2 95.00%. No results found. No results found for this basename: WBC:2,HGB:2,HCT:2,PLT:2 in the last 72 hours No results found for this basename: NA:2,K:2,CL:2,CO:2,GLUCOSE:2,BUN:2,CREATININE:2,CALCIUM:2 in the last 72 hours CBG (last 3)   Basename 07/26/12 2022 07/26/12 1702 07/26/12 1139  GLUCAP 231* 221* 154*    Wt Readings from Last 3 Encounters:  07/23/12 91 kg (200 lb 9.9 oz)  07/22/12 91.2 kg (201 lb 1 oz)    Physical Exam:  Constitutional: She is oriented to person, place, and time. She appears well-developed and well-nourished.  HENT:  Head: Normocephalic.  Left Ear: External ear normal.  Eyes: Pupils are equal, round, and reactive to light.  Neck: Normal range of motion.  Cardiovascular: Normal rate and regular rhythm.  Pulmonary/Chest: She is CTA bilaterally. No distress Neurological: She is alert and oriented to person, place, and time.  RLE with impaired proprioception At the toes. Decreased sensation to LT below the knees. DTR's 2++ Skin: Skin is warm and dry.  Motor strength is 4/5 in bilateral deltoid, biceps, triceps, grip  2 minus in the left hip flexors 3 minus knee extensors, right HF 1+, KF 1+ to 2;  1/5 in the right ankle dorsiflexor plantar flexor and 2+ to 3-/5 in the left ankle dorsiflexor plantar flexor    Assessment/Plan: 1. Functional deficits secondary to multiple sclerosis with neurogenic bowel and bladder and thoracic myelopathy which require 3+ hours per day of interdisciplinary therapy in a comprehensive inpatient rehab setting. Physiatrist is providing close team supervision and 24 hour  management of active medical problems listed below. Physiatrist and rehab team continue to assess barriers to discharge/monitor patient progress toward functional and medical goals. FIM: FIM - Bathing Bathing Steps Patient Completed: Chest;Right Arm;Left Arm;Abdomen;Front perineal area;Right upper leg;Buttocks;Left upper leg;Right lower leg (including foot);Left lower leg (including foot) Bathing: 5: Supervision: Safety issues/verbal cues  FIM - Upper Body Dressing/Undressing Upper body dressing/undressing steps patient completed: Thread/unthread right sleeve of pullover shirt/dresss;Thread/unthread left sleeve of pullover shirt/dress;Put head through opening of pull over shirt/dress;Pull shirt over trunk Upper body dressing/undressing: 5: Supervision: Safety issues/verbal cues FIM - Lower Body Dressing/Undressing Lower body dressing/undressing steps patient completed: Thread/unthread right pants leg;Thread/unthread left pants leg;Pull pants up/down Lower body dressing/undressing: 2: Max-Patient completed 25-49% of tasks  FIM - Toileting Toileting steps completed by patient: Performs perineal hygiene Toileting Assistive Devices: Grab bar or rail for support Toileting: 2: Max-Patient completed 1 of 3 steps  FIM - Diplomatic Services operational officer Devices: Grab bars;Elevated toilet seat Toilet Transfers: 3-To toilet/BSC: Mod A (lift or lower assist);3-From toilet/BSC: Mod A (lift or lower assist)  FIM - Bed/Chair Transfer Bed/Chair Transfer Assistive Devices: Bed rails;HOB elevated Bed/Chair Transfer: 3: Bed > Chair or W/C: Mod A (lift or lower assist);3: Chair or W/C > Bed: Mod A (lift or lower assist)  FIM - Locomotion: Wheelchair Distance: 150 Locomotion: Wheelchair: 5: Travels 150 ft or more: maneuvers on rugs and over door sills with supervision, cueing or coaxing FIM - Locomotion: Ambulation Locomotion: Ambulation Assistive Devices: Parallel bars Ambulation/Gait  Assistance: 4: Min assist Locomotion: Ambulation: 1: Travels less than 50 ft with moderate assistance (Pt: 50 - 74%)  Comprehension Comprehension Mode: Auditory Comprehension: 6-Follows complex conversation/direction: With extra time/assistive device  Expression Expression Mode: Verbal Expression: 6-Expresses complex ideas: With extra time/assistive device  Social Interaction Social Interaction: 6-Interacts appropriately with others with medication or extra time (anti-anxiety, antidepressant).  Problem Solving Problem Solving: 6-Solves complex problems: With extra time  Memory Memory: 6-More than reasonable amt of time  Medical Problem List and Plan:  1. DVT Prophylaxis/Anticoagulation: Pharmaceutical: Lovenox  2. Pain Management: Will continue prn morphine for back pain from coughing spells.  3. Mood: Reporting anxiety causing insomnia. Ego support provided. Continue cymbalta daily.  4. Neuropsych: This patient is capable of making decisions on his/her own behalf.  5. DM type 2: continue AC/HS CBG checks. BS uncontrolled due to steroids on board. Continue Amaryl and canagliflozin. lantus increased. Watch for hypoglycemia. 6. MS flare: IV solumedrol to prednisone taper.  Continue rebiff 3 X week.  7. Bronchitis with likely PNA: on levaquin. continue anti-tussives. Use nebs prn SOB/wheezing. IS 8. Leukocytosis: likely reactive due to PNA as well as steroid effect. No fevers noted and patient clinically looks better. Will monitor for symptoms of infection. CXR without disease 9. HTN: continue norvasc, avapro and atenolol daily. Monitor BP on bid basis.  10. Neurogenic bladder:     nursing toilet every 4 hours and scan to check bladder volumes. UCS 01/21 without growth.  -persistent, baseline urinary frequency. Added ditropan. Consider tid dosing  LOS (Days) 4 A FACE TO FACE EVALUATION WAS PERFORMED  Laura Roberts T 07/27/2012 7:18 AM

## 2012-07-27 NOTE — Progress Notes (Signed)
Hypoglycemic Event  CBG: 62  Treatment: 1/2 cup regular Sprite  Symptoms: shaky  Follow-up CBG: Time: 0726 CBG Result: 110  Possible Reasons for Event: unknown  Comments/MD notified:  Followed hypoglycemic protocol.  Patient taking PO's without difficulty    Laura Roberts  Remember to initiate Hypoglycemia Order Set & complete

## 2012-07-27 NOTE — Progress Notes (Signed)
Occupational Therapy Note  Patient Details  Name: Laura Roberts MRN: [id number] Date of Birth: 01/03/1941 Today's Date: 07/27/2012  Time: 0916-0958 Pt denies pain Individual Therapy Pt rolled w/c to gym to engage in dynamic standing activities, stepping activities, and sit<>stand. Pt exhibits difficulty picking up and advancing RLE especially when stepping backwards.  Pt required physical assistance when stepping backwards.  Pt required multiple rest breaks throughout session.  Pt performed sit<>stand with min A.  Lavone Neri [facility] 07/27/2012, 12:10 PM

## 2012-07-27 NOTE — Progress Notes (Signed)
Physical Therapy Note  Patient Details  Name: Laura Roberts MRN: [id number] Date of Birth: 16-Feb-1941 Today's Date: 07/27/2012  1100-1155 (55 minutes) individual Pain: no reported pain Focus of treatment: wc mobility/endurance; transfer training; Neuro re-ed/ strengthening bilateral LEs; sit to stand/standing tolerance using appropriate assistive device.  Treatment: Pt up in wc upon arrival; wc mobility 120 feet X 2 modified independent; transfer wc > mat scoot (level) close SBA (securing WC) + assist legrests; sit to supine mod assist bilateral LEs ; supine to sit mod assist bilateral LEs /trunk from right; Neuro re-ed bilateral LEs- AA RT hip flexion/extension in sidelying (gravity eliminated) ; heel slides on left; SAQs; AA RT hip ad/abduction in supine, passive bilateral heel cord stretch; ankle pumps (minimal on right) (all X 20); sit >< stand using STEDY  2 X 10 with assist to prevent left knee hyperextension.  1550-1640 (50 minutes) individual Pain: no reported pain Other: PA to notify [facility] to repair RT AFO Focus of treatment: bilateral LE strengthening; transfer training; wc mobility for endurance Treatment: wc mobility 120 feet X 2 modified independent for activity tolerance; wc > mat transfer level scoot SBA with assist for legrests, armrest;mat > wc using RW min/mod assist - pt unable to unweight RT LE and step forward or backward + hyperextends Lt knee in stance to maintain stability; bilateral LAQs in sitting X 15; Sit to stand from various heights using STEDY for bilateral knee/ hip eccentric/concentric extension control.   [name],JIM 07/27/2012, 7:29 AM

## 2012-07-27 NOTE — Progress Notes (Signed)
[facility] Individual Statement of Services  Patient Name:  Laura Roberts  Date:  07/27/2012  Welcome to the [facility].  Our goal is to provide you with an individualized program based on your diagnosis and situation, designed to meet your specific needs.  With this comprehensive rehabilitation program, you will be expected to participate in at least 3 hours of rehabilitation therapies Monday-Friday, with modified therapy programming on the weekends.  Your rehabilitation program will include the following services:  Physical Therapy (PT), Occupational Therapy (OT), 24 hour per day rehabilitation nursing, Therapeutic Recreaction (TR), Neuropsychology, Case Management (Social Worker), Rehabilitation Medicine, Nutrition Services and Pharmacy Services  Weekly team conferences will be held on  to discuss your progress.  Your  Social Worker will talk with you frequently to get your input and to update you on team discussions.  Team conferences with you and your family in attendance may also be held.  Expected length of stay: 10-14 days  Overall anticipated outcome: modified independent w/c level                                                                                                                        To minimal assist ambulation  Depending on your progress and recovery, your program may change.  Your  Social Worker will coordinate services and will keep you informed of any changes.  Your  Social Worker's name and contact numbers are listed  below.  The following services may also be recommended but are not provided by the [facility]:   Driving Evaluations  Home Health Rehabiltiation Services  Outpatient Rehabilitat[facility]  Vocational Rehabilitation   Arrangements will be made to provide these services after discharge if needed.  Arrangements include referral to agencies that provide these services.  Your  insurance has been verified to be:  Medicare and [employer] Your primary doctor is:  Dr. Nicholos Johns  Pertinent information will be shared with your doctor and your insurance company.   Social Worker:  [place], [place] [phone] or ([phone]  Information discussed with and copy given to patient by: Amada Jupiter, 07/27/2012, 2:09 PM

## 2012-07-28 ENCOUNTER — Inpatient Hospital Stay (HOSPITAL_COMMUNITY): Payer: Medicare Other | Admitting: Occupational Therapy

## 2012-07-28 ENCOUNTER — Inpatient Hospital Stay (HOSPITAL_COMMUNITY): Payer: Medicare Other

## 2012-07-28 ENCOUNTER — Inpatient Hospital Stay (HOSPITAL_COMMUNITY): Payer: Medicare Other | Admitting: Physical Therapy

## 2012-07-28 LAB — GLUCOSE, CAPILLARY
Glucose-Capillary: 108 mg/dL — ABNORMAL HIGH (ref 70–99)
Glucose-Capillary: 132 mg/dL — ABNORMAL HIGH (ref 70–99)
Glucose-Capillary: 195 mg/dL — ABNORMAL HIGH (ref 70–99)

## 2012-07-28 MED ORDER — OXYBUTYNIN CHLORIDE 5 MG PO TABS
5.0000 mg | ORAL_TABLET | Freq: Three times a day (TID) | ORAL | Status: DC
  Administered 2012-07-28 – 2012-08-07 (×31): 5 mg via ORAL
  Filled 2012-07-28 (×35): qty 1

## 2012-07-28 NOTE — Progress Notes (Addendum)
Subjective/Complaints: Urinary frequency with incontinence still. Cough perhaps better A 12 point review of systems has been performed and if not noted above is otherwise negative.   Objective: Vital Signs: Blood pressure 133/78, pulse 63, temperature 98.3 F (36.8 C), temperature source Oral, resp. rate 19, height 5\' 2"  (1.575 m), weight 91 kg (200 lb 9.9 oz), SpO2 97.00%. No results found. No results found for this basename: WBC:2,HGB:2,HCT:2,PLT:2 in the last 72 hours No results found for this basename: NA:2,K:2,CL:2,CO:2,GLUCOSE:2,BUN:2,CREATININE:2,CALCIUM:2 in the last 72 hours CBG (last 3)   Basename 07/28/12 0705 07/27/12 2102 07/27/12 1649  GLUCAP 108* 343* 235*    Wt Readings from Last 3 Encounters:  07/23/12 91 kg (200 lb 9.9 oz)  07/22/12 91.2 kg (201 lb 1 oz)    Physical Exam:  Constitutional: She is oriented to person, place, and time. She appears well-developed and well-nourished.  HENT:  Head: Normocephalic.  Left Ear: External ear normal.  Eyes: Pupils are equal, round, and reactive to light.  Neck: Normal range of motion.  Cardiovascular: Normal rate and regular rhythm.  Pulmonary/Chest: She is CTA bilaterally. No distress Neurological: She is alert and oriented to person, place, and time.  RLE with impaired proprioception At the toes. Decreased sensation to LT below the knees. DTR's 2++ Skin: Skin is warm and dry.  Motor strength is 4/5 in bilateral deltoid, biceps, triceps, grip  2 minus in the left hip flexors 3   knee extensors, right HF 1+, KF 2 to 2+;  1/5 in the right ankle dorsiflexor plantar flexor and 2+ to 3-/5 in the left ankle dorsiflexor plantar flexor    Assessment/Plan: 1. Functional deficits secondary to multiple sclerosis with neurogenic bowel and bladder and thoracic myelopathy which require 3+ hours per day of interdisciplinary therapy in a comprehensive inpatient rehab setting. Physiatrist is providing close team supervision and 24 hour  management of active medical problems listed below. Physiatrist and rehab team continue to assess barriers to discharge/monitor patient progress toward functional and medical goals.  Needs repair or replacement of afo (i favor replacement) FIM: FIM - Bathing Bathing Steps Patient Completed: Chest;Right Arm;Left Arm;Abdomen;Front perineal area;Right upper leg;Buttocks;Left upper leg;Right lower leg (including foot);Left lower leg (including foot) Bathing: 5: Supervision: Safety issues/verbal cues  FIM - Upper Body Dressing/Undressing Upper body dressing/undressing steps patient completed: Thread/unthread right sleeve of pullover shirt/dresss;Thread/unthread left sleeve of pullover shirt/dress;Put head through opening of pull over shirt/dress;Pull shirt over trunk Upper body dressing/undressing: 5: Supervision: Safety issues/verbal cues FIM - Lower Body Dressing/Undressing Lower body dressing/undressing steps patient completed: Thread/unthread right pants leg;Thread/unthread left pants leg;Pull pants up/down Lower body dressing/undressing: 2: Max-Patient completed 25-49% of tasks  FIM - Toileting Toileting steps completed by patient: Performs perineal hygiene;Adjust clothing prior to toileting Toileting Assistive Devices: Grab bar or rail for support Toileting: 3: Mod-Patient completed 2 of 3 steps  FIM - Diplomatic Services operational officer Devices: Grab bars (3 in 1 over toilet) Toilet Transfers: 4-From toilet/BSC: Min A (steadying Pt. > 75%);4-To toilet/BSC: Min A (steadying Pt. > 75%) (increased time-need to allow pt to initiate stepping)  FIM - Bed/Chair Transfer Bed/Chair Transfer Assistive Devices: Bed rails;HOB elevated Bed/Chair Transfer: 3: Bed > Chair or W/C: Mod A (lift or lower assist);3: Chair or W/C > Bed: Mod A (lift or lower assist)  FIM - Locomotion: Wheelchair Distance: 150 Locomotion: Wheelchair: 5: Travels 150 ft or more: maneuvers on rugs and over door sills  with supervision, cueing or coaxing FIM - Locomotion: Ambulation Locomotion:  Ambulation Assistive Devices: Parallel bars Ambulation/Gait Assistance: 4: Min assist Locomotion: Ambulation: 1: Travels less than 50 ft with moderate assistance (Pt: 50 - 74%)  Comprehension Comprehension Mode: Auditory Comprehension: 6-Follows complex conversation/direction: With extra time/assistive device  Expression Expression Mode: Verbal Expression: 6-Expresses complex ideas: With extra time/assistive device  Social Interaction Social Interaction: 6-Interacts appropriately with others with medication or extra time (anti-anxiety, antidepressant).  Problem Solving Problem Solving: 6-Solves complex problems: With extra time  Memory Memory: 6-More than reasonable amt of time  Medical Problem List and Plan:  1. DVT Prophylaxis/Anticoagulation: Pharmaceutical: Lovenox  2. Pain Management: Will continue prn morphine for back pain from coughing spells.  3. Mood: Reporting anxiety causing insomnia. Ego support provided. Continue cymbalta daily.  4. Neuropsych: This patient is capable of making decisions on his/her own behalf.  5. DM type 2: continue AC/HS CBG checks. BS uncontrolled due to steroids on board. Continue Amaryl and canagliflozin. lantus increased. Watch for hypoglycemia. 6. MS flare: IV solumedrol to prednisone taper.  Continue rebiff 3 X week.  7. Bronchitis with likely PNA: on levaquin. continue anti-tussives. Use nebs prn SOB/wheezing. IS 8. Leukocytosis: likely reactive due to PNA as well as steroid effect. No fevers noted and patient clinically looks better. Will monitor for symptoms of infection. CXR without disease 9. HTN: continue norvasc, avapro and atenolol daily. Monitor BP on bid basis.  10. Neurogenic bladder:     nursing toilet every 4 hours and scan to check bladder volumes. UCS 01/21 without growth.  -persistent, baseline urinary frequency. Increase ditropan to tid  LOS (Days)  5 A FACE TO FACE EVALUATION WAS PERFORMED  SWARTZ,ZACHARY T 07/28/2012 7:22 AM

## 2012-07-28 NOTE — Progress Notes (Signed)
Occupational Therapy Note  Patient Details  Name: Laura Roberts MRN: [id number] Date of Birth: 1940/09/27 Today's Date: 07/28/2012  Time: 1300-1345 Pt denies pain Individual Therapy Pt practiced w/c set up, squat pivot transfers, and bed mobility using leg lifter for BLE management.  Pt performed all tasks with supervision but required periodic rest breaks throughout throughout tasks.  Pt transitioned to gym for BUE therex on UE ergonometer to increase endurance.  O2 sats >90% on RA throughout exercises although patient SOB and required rest breaks.   Lavone Neri [facility] 07/28/2012, 2:44 PM

## 2012-07-28 NOTE — Progress Notes (Signed)
Occupational Therapy Session Note  Patient Details  Name: Laura Roberts MRN: [id number] Date of Birth: [birth year]  Today's Date: 07/28/2012 Time: 7829-5621 Time Calculation (min): 31 min  Short Term Goals: Week 1:  OT Short Term Goal 1 (Week 1): Patient will complete tub/shower transfer with minimal assistance OT Short Term Goal 2 (Week 1): Patient will complete UB/LB bathing with supervision using AE prn OT Short Term Goal 3 (Week 1): Patient will complete LB dressing with min assist using AE prn OT Short Term Goal 4 (Week 1): Patient will complete toilet transfer with supervision using DME prn  Skilled Therapeutic Interventions/Progress Updates:    Pt seen for 1:1 OT with focus on sit <> stand, static standing balance, functional transfers, and BUE strengthening.  Pt completed sit <> stand with light min/steady assist secondary to fatigue and support with BLE weakness.  Pt required multiple rest breaks secondary to fatigue from prior to sessions.  BUE strengthening with use of #2 dowel rod to increase activity tolerance and endurance with w/c propulsion and transfers.  Pt reports needing to toilet, stand pivot transfer to toilet with use of grab bars and steady assist when doffing pants.  Therapy Documentation Precautions:  Precautions Precautions: Fall Restrictions Weight Bearing Restrictions: No General:   Vital Signs: Therapy Vitals Pulse Rate: 70  BP: 120/79 mmHg Pain: Pain Assessment Pain Assessment: 0-10 Pain Score:   3 Pain Type: Acute pain Pain Location: Head Pain Descriptors: Headache  See FIM for current functional status  Therapy/Group: Individual Therapy  Leonette Monarch 07/28/2012, 3:11 PM

## 2012-07-28 NOTE — Progress Notes (Signed)
Orthopedic Tech Progress Note Patient Details:  Laura Roberts [birth year] [id number] Brace order placed for [facility] to fill. Unable to reach anyone at [facility] [employer] or at representative's cell phone. Order given to [employer]. [name] from [employer] to fit patient for brace.  Patient ID: Laura Roberts, female   DOB: 02-Nov-1940, 72 y.o.   MRN: [id number]   Laura Roberts 07/28/2012, 4:50 PM

## 2012-07-28 NOTE — Progress Notes (Signed)
Occupational Therapy Session Note  Patient Details  Name: Laura Roberts MRN: [id number] Date of Birth: [birth year]  Today's Date: 07/28/2012 Time: 0830-0925 Time Calculation (min): 55 min  Short Term Goals: Week 1:  OT Short Term Goal 1 (Week 1): Patient will complete tub/shower transfer with minimal assistance OT Short Term Goal 2 (Week 1): Patient will complete UB/LB bathing with supervision using AE prn OT Short Term Goal 3 (Week 1): Patient will complete LB dressing with min assist using AE prn OT Short Term Goal 4 (Week 1): Patient will complete toilet transfer with supervision using DME prn  Skilled Therapeutic Interventions/Progress Updates:  Patient found seated in w/c at sink performing grooming tasks independently. Patient requested to use bathroom, patient propelled self into bathroom and performed stand pivot transfer w/c <-> elevated toilet seat using grab bars prn. Patient then engaged in ADL at sink level in sit->stand position. Focused skilled intervention on sit/stands, dynamic standing balance/tolerance/endurance, UB/LB bathing & dressing, toilet transfer, toileting, overall activity tolerance/endurance, and energy conservation prn. Patient used bed side chair to prop bilateral LEs on in order to increase independence with LB dressing AND conserve energy during LB dressing.  Discussed with patient ELOS and d/c planning. At end of session showed patient family room and day room and encouraged her to propel self around unit as she wanted. Left patient in w/c at family room.   Precautions:  Precautions Precautions: Fall Restrictions Weight Bearing Restrictions: No  See FIM for current functional status  Therapy/Group: Individual Therapy  [name],[name] 07/28/2012, 9:34 AM

## 2012-07-28 NOTE — Progress Notes (Signed)
Physical Therapy Session Note  Patient Details  Name: Laura Roberts MRN: [id number] Date of Birth: 12-23-1940  Today's Date: 07/28/2012 Time: 1106-1200 Time Calculation (min): 54 min  Short Term Goals: Week 1:  PT Short Term Goal 1 (Week 1): bed mobility supervision using assistive device to manage right leg.   PT Short Term Goal 2 (Week 1): transfers to both right and left side stand or squat pivot min assist PT Short Term Goal 3 (Week 1): gait with rollator mod assist with R AFO.   PT Short Term Goal 4 (Week 1): WC mobility >200' for endurance training mod I  PT Short Term Goal 5 (Week 1): Dynamic standing balance during functional task like toileting (managing pants and underwear) or washing hands at sink min assist.    Skilled Therapeutic Interventions/Progress Updates:    Pt reports not feeling well today.  Weak and tired.  Sugar was just recently checked per pt and was in the 100s.  BP120/78 and HR 70 bpm.  Continued therapy monitoring pt throughout.  WC mobility 150' x 2 mod I, good speed.  Needs assist with leg rests. Transfers WC to mat table and back to WC min assist to help steady pt while stepping.  Sit to supine using leg lifter min assist only to adjust legs once all the way down on the table.  Supine to sit mod I with leg lifter to manage bil legs.  Gait in parallel bars 5'x6 with WC to follow closely due to pt could only take 3-5 steps max before needing to sit for 1-2 min rest break before trying again.  Mod assist to help with gait.  Pt needing assist with progression of right leg forward, and blocking of bil alternating stance leg as well as support at trunk to help control descent to [facility] once fatigued and could not go further.  Sit to stand in parallel bars practicing locking and unlocking left knee with more controlled descent.  5 reps x 3 sets min assist to help with left knee control.    Therapy Documentation Precautions:  Precautions Precautions: Fall Required Braces or  Orthoses: Other Brace/Splint Other Brace/Splint: right leafspring AFO, needs repair/update- needs more solid AFO Restrictions Weight Bearing Restrictions: No General:   Vital Signs: Therapy Vitals Temp: 98.4 F (36.9 C) Temp src: Oral Pulse Rate: 69  Resp: 19  BP: 115/79 mmHg Patient Position, if appropriate: Sitting Oxygen Therapy SpO2: 95 % O2 Device: None (Room air)    Locomotion : Ambulation Ambulation/Gait Assistance: 3: Mod assist Wheelchair Mobility Distance: 150   See FIM for current functional status  Therapy/Group: Individual Therapy  Lurena Joiner B. Talik Casique, PT, DPT [phone]   07/28/2012, 4:44 PM

## 2012-07-28 NOTE — Patient Care Conference (Signed)
Inpatient RehabilitationTeam Conference and Plan of Care Update Date: 07/28/2012   Time: 2:25 PM    Patient Name: Laura Roberts      Medical Record Number: [id number]  Date of Birth: 25-Jul-1940 Sex: Female         Room/Bed: 4004/4004-01 Payor Info: Payor: MEDICARE  Plan: MEDICARE PART A AND B  Product Type: *No Product type*     Admitting Diagnosis: MS exacerbation  Admit Date/Time:  07/23/2012  4:14 PM Admission Comments: No comment available   Primary Diagnosis:  <principal problem not specified> Principal Problem: <principal problem not specified>  Patient Active Problem List   Diagnosis Date Noted  . SOB (shortness of breath) 07/21/2012  . Weakness 07/21/2012  . Bronchitis 07/21/2012  . Multiple sclerosis exacerbation 07/21/2012  . Cough 07/21/2012  . HTN (hypertension) 07/21/2012  . DM (diabetes mellitus) 07/21/2012  . HLD (hyperlipidemia) 07/21/2012  . Tachycardia 07/21/2012  . GERD (gastroesophageal reflux disease) 07/21/2012  . Multiple sclerosis     Expected Discharge Date: Expected Discharge Date: 08/07/12  Team Members Present: Physician leading conference: Dr. Faith Rogue Social Worker Present: Amada Jupiter, LCSW Nurse Present: Daryll Brod, RN PT Present: Karolee Stamps, PT;Other (comment) Corinna Capra., PT) OT Present: Edwin Cap, OT Other (Discipline and Name): Tora Duck, PPS Coordinator     Current Status/Progress Goal Weekly Team Focus  Medical   ms exacerbation, right greatert than left sided weakness  increase strength and stability  bowel and bladder   Bowel/Bladder   Continent of bowel. LBM 07/26/12. H/o stress incontinence. Wears brief. Currently on Ditropan  Decreased incontience episode  Timed toileting q 2-3 hours   Swallow/Nutrition/ Hydration             ADL's   supervision -> min assist  Mod I  ADL retraining, donning of AFO & shoes, overall activity tolerance/endurance, energy conservation prn, ADL transfers   Mobility   up to  min assist bed mobility (pt  has leg lifter that she is using , but still needs some adjustments once in bed), heavy min assist transfers when she feels weak, when she feels strong transfers with close supervision, gait mod assist in parallel bars with chair to follow closely behind due to knees buckle.  Sounds like there is a brace consult in to look at her right leg brace (it is coming apart and not as supportive as she now needs).    WC level goals mod I overall, supervision for car transfer, min assist gait (keeping in mind she is going home WC level she will only practice gait at home with HHPT or someone there to assist her).    transfers, bed mobility, different types of transfers (squat pivot, stand pivot, scoot, stand pivot with RW), WC mobility for endurance training, standing and gait in parallel bars.     Communication             Safety/Cognition/ Behavioral Observations            Pain   Scheduled Neurotin 300mg  qid  <3  Offer pain medication 1 hr prior to initial therapy session   Skin   CDI  No skin breakdown  Assess skin q shift    Rehab Goals Patient on target to meet rehab goals: Yes *See Interdisciplinary Assessment and Plan and progress notes for long and short-term goals  Barriers to Discharge: advancing neuro deficits, lack of assistance at home    Possible Resolutions to Barriers:  wheelchair level goals. new afo  Discharge Planning/Teaching Needs:  home with friend to assist as much as needed      Team Discussion:  Discussion of current bracing on left LE - need to reassess if this is best for her.  Continues to have stress incontinence.  Anticipating mod i goals overall- pt remains very motivated.  Revisions to Treatment Plan:  Possible new bracing for LE.     Continued Need for Acute Rehabilitation Level of Care: The patient requires daily medical management by a physician with specialized training in physical medicine and rehabilitation for the following  conditions: Daily analysis of laboratory values and/or radiology reports with any subsequent need for medication adjustment of medical intervention for : Neurological problems (neurogenic bladder)  Celina Shiley 07/28/2012, 4:41 PM

## 2012-07-29 ENCOUNTER — Inpatient Hospital Stay (HOSPITAL_COMMUNITY): Payer: Medicare Other | Admitting: Occupational Therapy

## 2012-07-29 ENCOUNTER — Inpatient Hospital Stay (HOSPITAL_COMMUNITY): Payer: Medicare Other | Admitting: Physical Therapy

## 2012-07-29 ENCOUNTER — Inpatient Hospital Stay (HOSPITAL_COMMUNITY): Payer: Medicare Other

## 2012-07-29 LAB — GLUCOSE, CAPILLARY
Glucose-Capillary: 58 mg/dL — ABNORMAL LOW (ref 70–99)
Glucose-Capillary: 141 mg/dL — ABNORMAL HIGH (ref 70–99)

## 2012-07-29 MED ORDER — INSULIN GLARGINE 100 UNIT/ML ~~LOC~~ SOLN
5.0000 [IU] | Freq: Every day | SUBCUTANEOUS | Status: DC
  Administered 2012-07-29 – 2012-08-03 (×6): 5 [IU] via SUBCUTANEOUS

## 2012-07-29 MED ORDER — INSULIN GLARGINE 100 UNIT/ML ~~LOC~~ SOLN
10.0000 [IU] | Freq: Every day | SUBCUTANEOUS | Status: DC
  Administered 2012-07-29 – 2012-07-30 (×2): 10 [IU] via SUBCUTANEOUS

## 2012-07-29 NOTE — Progress Notes (Signed)
Physical Therapy Session Note  Patient Details  Name: Laura Roberts MRN: [id number] Date of Birth: 1941-02-26  Today's Date: 07/29/2012 Time: 1300-1356 Time Calculation (min): 56 min  Short Term Goals: Week 1:  PT Short Term Goal 1 (Week 1): bed mobility supervision using assistive device to manage right leg.   PT Short Term Goal 2 (Week 1): transfers to both right and left side stand or squat pivot min assist PT Short Term Goal 3 (Week 1): gait with rollator mod assist with R AFO.   PT Short Term Goal 4 (Week 1): WC mobility >200' for endurance training mod I  PT Short Term Goal 5 (Week 1): Dynamic standing balance during functional task like toileting (managing pants and underwear) or washing hands at sink min assist.    Skilled Therapeutic Interventions/Progress Updates:    Pt reports [employer] came by earlier and measured/casted pt for new AFO on RLE. Pt propelled w/c to and from therapy for overall endurance and strengthening mod I. Squat/stand pivot transfers with min A w/c <-> mat. Practiced sit to stands with RW from edge of mat with min A x 5 reps; as fatigued, knees buckling somewhat but able to work on eccentric control from stand to sit. Then worked on bed mobility with leg lifter and supine therex including heel slides (AAROM), hip abduction/adduction (AAROM), SAQ, and LAQ (AAROM on RLE) x 10 reps each bilaterally. Bridging x 10 reps; then 5 more reps with 10 second hold for core and proximal strengthening. Min A transfer back to w/c.   Therapy Documentation Precautions:  Precautions Precautions: Fall Required Braces or Orthoses: Other Brace/Splint Other Brace/Splint: right leafspring AFO, needs repair/update- needs more solid AFO Restrictions Weight Bearing Restrictions: No   Pain: No complaints of pain.  See FIM for current functional status  Therapy/Group: Individual Therapy  Karolee Stamps [facility] 07/29/2012, 2:52 PM

## 2012-07-29 NOTE — Progress Notes (Signed)
Occupational Therapy Session Note  Patient Details  Name: Laura Roberts MRN: [id number] Date of Birth: [birth year]  Today's Date: 07/29/2012 Time: 0730-0900 Time Calculation (min): 90 min  Short Term Goals: Week 1:  OT Short Term Goal 1 (Week 1): Patient will complete tub/shower transfer with minimal assistance OT Short Term Goal 2 (Week 1): Patient will complete UB/LB bathing with supervision using AE prn OT Short Term Goal 3 (Week 1): Patient will complete LB dressing with min assist using AE prn OT Short Term Goal 4 (Week 1): Patient will complete toilet transfer with supervision using DME prn  Skilled Therapeutic Interventions/Progress Updates:  Patient found seated in w/c beside bed with no complaints of pain, but complaints of a low glucose level this am. Patient finished breakfast and then therapist propelled patient from room -> ADL apartment for ADL retraining. Patient transferred w/c <-> elevated toilet seat for toileting needs. Patient then transferred w/c -> tub transfer bench set up in tub/shower and performed ADL at shower level. Focused skilled intervention on UB/LB bathing & dressing, overall activity tolerance/endurance, energy conservation techniques prn, sit/stands, dynamic standing balance/tolerance/endurance, various transfers (squat pivot without use of rolling walker), and safety with transfers/education for safe transfers. Patient propelled self back to room at end of session.   Precautions:  Precautions Precautions: Fall Required Braces or Orthoses: Other Brace/Splint Other Brace/Splint: right leafspring AFO, needs repair/update- needs more solid AFO Restrictions Weight Bearing Restrictions: No  See FIM for current functional status  Therapy/Group: Individual Therapy  Lachrista Heslin,Lauranne 07/29/2012, 11:57 AM

## 2012-07-29 NOTE — Progress Notes (Signed)
Subjective/Complaints: Urinary frequency better. Hypoglycemic this am A 12 point review of systems has been performed and if not noted above is otherwise negative.   Objective: Vital Signs: Blood pressure 115/71, pulse 72, temperature 97.8 F (36.6 C), temperature source Oral, resp. rate 18, height 5\' 2"  (1.575 m), weight 91 kg (200 lb 9.9 oz), SpO2 98.00%. No results found. No results found for this basename: WBC:2,HGB:2,HCT:2,PLT:2 in the last 72 hours No results found for this basename: NA:2,K:2,CL:2,CO:2,GLUCOSE:2,BUN:2,CREATININE:2,CALCIUM:2 in the last 72 hours CBG (last 3)   Basename 07/29/12 0740 07/29/12 0717 07/28/12 2048  GLUCAP 79 58* 176*    Wt Readings from Last 3 Encounters:  07/23/12 91 kg (200 lb 9.9 oz)  07/22/12 91.2 kg (201 lb 1 oz)    Physical Exam:  Constitutional: She is oriented to person, place, and time. She appears well-developed and well-nourished.  HENT:  Head: Normocephalic.  Left Ear: External ear normal.  Eyes: Pupils are equal, round, and reactive to light.  Neck: Normal range of motion.  Cardiovascular: Normal rate and regular rhythm.  Pulmonary/Chest: She is CTA bilaterally. No distress Neurological: She is alert and oriented to person, place, and time.  RLE with impaired proprioception At the toes. Decreased sensation to LT below the knees. DTR's 2++ Skin: Skin is warm and dry.  Motor strength is 4/5 in bilateral deltoid, biceps, triceps, grip  2 minus in the left hip flexors 3   knee extensors, right HF 1+, KF 2 to 2+;  1/5 in the right ankle dorsiflexor plantar flexor and 2+ to 3-/5 in the left ankle dorsiflexor plantar flexor    Assessment/Plan: 1. Functional deficits secondary to multiple sclerosis with neurogenic bowel and bladder and thoracic myelopathy which require 3+ hours per day of interdisciplinary therapy in a comprehensive inpatient rehab setting. Physiatrist is providing close team supervision and 24 hour management of active  medical problems listed below. Physiatrist and rehab team continue to assess barriers to discharge/monitor patient progress toward functional and medical goals.  Needs repair or replacement of afo (i favor replacement) FIM: FIM - Bathing Bathing Steps Patient Completed: Chest;Right Arm;Left Arm;Abdomen;Front perineal area;Buttocks;Right upper leg;Left upper leg Bathing: 4: Min-Patient completes 8-9 49f 10 parts or 75+ percent  FIM - Upper Body Dressing/Undressing Upper body dressing/undressing steps patient completed: Thread/unthread right sleeve of pullover shirt/dresss;Thread/unthread left sleeve of pullover shirt/dress;Put head through opening of pull over shirt/dress;Pull shirt over trunk Upper body dressing/undressing: 5: Set-up assist to: Obtain clothing/put away FIM - Lower Body Dressing/Undressing Lower body dressing/undressing steps patient completed: Thread/unthread right pants leg;Thread/unthread left pants leg;Pull pants up/down;Don/Doff right sock;Don/Doff left sock;Don/Doff left shoe;Fasten/unfasten left shoe Lower body dressing/undressing: 4: Min-Patient completed 75 plus % of tasks  FIM - Toileting Toileting steps completed by patient: Adjust clothing prior to toileting;Performs perineal hygiene Toileting Assistive Devices: Grab bar or rail for support Toileting: 3: Mod-Patient completed 2 of 3 steps  FIM - Diplomatic Services operational officer Devices: Elevated toilet seat;Grab bars Toilet Transfers: 4-To toilet/BSC: Min A (steadying Pt. > 75%)  FIM - Bed/Chair Transfer Bed/Chair Transfer Assistive Devices: Bed rails;HOB elevated Bed/Chair Transfer: 5: Supine > Sit: Supervision (verbal cues/safety issues);4: Sit > Supine: Min A (steadying pt. > 75%/lift 1 leg);4: Bed > Chair or W/C: Min A (steadying Pt. > 75%);4: Chair or W/C > Bed: Min A (steadying Pt. > 75%)  FIM - Locomotion: Wheelchair Distance: 150 Locomotion: Wheelchair: 6: Travels 150 ft or more, turns  around, maneuvers to table, bed or toilet, negotiates 3%  grade: maneuvers on rugs and over door sills independently FIM - Locomotion: Ambulation Locomotion: Ambulation Assistive Devices: Parallel bars Ambulation/Gait Assistance: 3: Mod assist Locomotion: Ambulation: 1: Travels less than 50 ft with moderate assistance (Pt: 50 - 74%)  Comprehension Comprehension Mode: Auditory Comprehension: 6-Follows complex conversation/direction: With extra time/assistive device  Expression Expression Mode: Verbal Expression: 6-Expresses complex ideas: With extra time/assistive device  Social Interaction Social Interaction: 6-Interacts appropriately with others with medication or extra time (anti-anxiety, antidepressant).  Problem Solving Problem Solving: 6-Solves complex problems: With extra time  Memory Memory: 6-More than reasonable amt of time  Medical Problem List and Plan:  1. DVT Prophylaxis/Anticoagulation: Pharmaceutical: Lovenox  2. Pain Management: Will continue prn morphine for back pain from coughing spells.  3. Mood: Reporting anxiety causing insomnia. Ego support provided. Continue cymbalta daily.  4. Neuropsych: This patient is capable of making decisions on his/her own behalf.  5. DM type 2: continue AC/HS CBG checks. BS uncontrolled due to steroids on board. Continue Amaryl and canagliflozin. lantus decreased in pm. Add 5 units in am. Watch for hypoglycemia. 6. MS flare: IV solumedrol to prednisone taper.  Continue rebiff 3 X week.  7. Bronchitis with likely PNA: on levaquin. continue anti-tussives. Use nebs prn SOB/wheezing. IS 8. Leukocytosis: likely reactive due to PNA as well as steroid effect. No fevers noted and patient clinically looks better. Will monitor for symptoms of infection. CXR without disease 9. HTN: continue norvasc, avapro and atenolol daily. Monitor BP on bid basis.  10. Neurogenic bladder:     nursing toilet every 4 hours and scan to check bladder volumes. UCS  01/21 without growth.  -persistent, baseline urinary frequency. Increased ditropan to tid which has helped  LOS (Days) 6 A FACE TO FACE EVALUATION WAS PERFORMED  Laura Roberts T 07/29/2012 7:51 AM

## 2012-07-29 NOTE — Progress Notes (Signed)
Physical Therapy Session Note  Patient Details  Name: Laura Roberts MRN: [id number] Date of Birth: June 03, 1941  Today's Date: 07/29/2012 Time: 1431-1510 Time Calculation (min): 39 min  Short Term Goals: Week 1:  PT Short Term Goal 1 (Week 1): bed mobility supervision using assistive device to manage right leg.   PT Short Term Goal 2 (Week 1): transfers to both right and left side stand or squat pivot min assist PT Short Term Goal 3 (Week 1): gait with rollator mod assist with R AFO.   PT Short Term Goal 4 (Week 1): WC mobility >200' for endurance training mod I  PT Short Term Goal 5 (Week 1): Dynamic standing balance during functional task like toileting (managing pants and underwear) or washing hands at sink min assist.    Skilled Therapeutic Interventions/Progress Updates:   Pt reporting filling fatigued from all of the therapy today.  Performed Nustep x 2 min x 2 on workload=1, for UE and LE strengthening.  Sit to stand with overall mod@, gait training with RW, PLAFO and shoe bootie on R, 11' and 23' with w/c right behind and min@.  Pt returned to room after therapy and chose to remain sitting up.  Therapy Documentation Precautions:  Precautions Precautions: Fall Required Braces or Orthoses: Other Brace/Splint Other Brace/Splint: right leafspring AFO, needs repair/update- needs more solid AFO Restrictions Weight Bearing Restrictions: No Pain:  No pain  See FIM for current functional status  Therapy/Group: Individual Therapy  [name] 07/29/2012, 3:30 PM

## 2012-07-29 NOTE — Progress Notes (Signed)
Social Work Patient ID: Ethelda Chick, female   DOB: 26-Aug-1940, 72 y.o.   MRN: [id number]  Have reviewed team conference with patient and both of her sons (this done via telephone conference call today).  All aware of targeted d/c date of 2/7 with modified independent w/c level goals.  Sons know that team is aware pt does not have 24/7 assistance available at home, but does have reliable, intermittent assist of local friends.  Sons concerned that pt may not reach a level that she can d/c home and ask about other options.  Have reviewed with them (and emailed written information) on local SNFs, Medicaid application and PACE program.  Sons also requesting if a home eval could be done to better assess her safety alone at home.  (Have relayed this request to therapies).   Plan to follow up with all on Friday afternoon to give update  - continue to follow for d/c planning needs.  Emmory Solivan, LCSW

## 2012-07-29 NOTE — Significant Event (Addendum)
Hypoglycemic Event  CBG: 58  Treatment: 15 GM carbohydrate snack  Symptoms: Shaky  Follow-up CBG: Time:0740 CBG Result:79  Possible Reasons for Event: Unknown  Comments/MD notified:Dr Riley Kill notified. New orders received    Laura Roberts, Laura Roberts  Remember to initiate Hypoglycemia Order Set & complete

## 2012-07-30 ENCOUNTER — Inpatient Hospital Stay (HOSPITAL_COMMUNITY): Payer: Medicare Other | Admitting: Occupational Therapy

## 2012-07-30 ENCOUNTER — Inpatient Hospital Stay (HOSPITAL_COMMUNITY): Payer: Medicare Other | Admitting: Physical Therapy

## 2012-07-30 ENCOUNTER — Inpatient Hospital Stay (HOSPITAL_COMMUNITY): Payer: Medicare Other

## 2012-07-30 NOTE — Progress Notes (Signed)
Occupational Therapy Note  Patient Details  Name: FRANCELIA MCLAREN MRN: [id number] Date of Birth: 1940-07-02 Today's Date: 07/30/2012  Time: 1345-1430 Pt denies pain Individual Therapy  Pt engaged in home and kitchen mgmt tasks at w/c level and ambulating with RW to complete tasks.  Pt performed all simple tasks with supervision and extra time to complete tasks.  Pt rolled to gym to transition to BUE therex with weighted ball (1 KG) and 3# weight bar.  Pt rolled to room at completion of session.   Lavone Neri [facility] 07/30/2012, 2:45 PM

## 2012-07-30 NOTE — Progress Notes (Signed)
Physical Therapy Session Note  Patient Details  Name: Laura Roberts MRN: [id number] Date of Birth: Oct 30, 1940  Today's Date: 07/30/2012 Time: 0809-0859 Time Calculation (min): 50 min  Short Term Goals: Week 1:  PT Short Term Goal 1 (Week 1): bed mobility supervision using assistive device to manage right leg.   PT Short Term Goal 2 (Week 1): transfers to both right and left side stand or squat pivot min assist PT Short Term Goal 3 (Week 1): gait with rollator mod assist with R AFO.   PT Short Term Goal 4 (Week 1): WC mobility >200' for endurance training mod I  PT Short Term Goal 5 (Week 1): Dynamic standing balance during functional task like toileting (managing pants and underwear) or washing hands at sink min assist.    Skilled Therapeutic Interventions/Progress Updates:    This session focused on bed mobility supervision with pt using bed rails and HOB elevated to 35 degrees.  Using hands to move right leg and not leg lifter.  Stand pivot to [facility] without assistive device min steadying assist.  Transfer to toilet in bathroom supervision pt using grab bars and handrails on WC to get to commode and back.  WC mobility to and from gym >150' mod I.  Stand pivot transfer practice with RW min assist to help manage RW and steady pt while stepping around.   She still tends to have a hyper extension moment on her left knee while staying flexed at her right knee, but is having better control at locking and unlocking that moment at the left knee while moving, stepping and transitioning from sit to stand.  Gait with RW min assist with chair to follow to encourage increased gait distance min assist.  Shoe cover placed on the toe of her right shoe to help her progress her foot forward better due to decreased DF R (even in R leaf spring AFO).  Pt traveled ~25' x 3 with seated rest breaks in between due to leg fatigue.    Therapy Documentation Precautions:  Precautions Precautions: Fall Required Braces or  Orthoses: Other Brace/Splint Other Brace/Splint: right leafspring AFO, needs repair/update- needs more solid AFO Restrictions Weight Bearing Restrictions: No   Vital Signs: Therapy Vitals Temp: 97.8 F (36.6 C) Temp src: Oral Pulse Rate: 64  Resp: 18  BP: 103/69 mmHg Patient Position, if appropriate: Sitting Oxygen Therapy SpO2: 99 % O2 Device: None (Room air)   Locomotion : Ambulation Ambulation/Gait Assistance: 4: Min assist Wheelchair Mobility Distance: 150   See FIM for current functional status  Therapy/Group: Individual Therapy  Lurena Joiner B. Rivers Gassmann, PT, DPT [phone]   07/30/2012, 4:01 PM

## 2012-07-30 NOTE — Plan of Care (Signed)
Problem: RH SAFETY Goal: RH STG ADHERE TO SAFETY PRECAUTIONS W/ASSISTANCE/DEVICE STG Adhere to Safety Precautions With Assistance/Device. Supervision Outcome: Progressing Calls appropriately for assistance     

## 2012-07-30 NOTE — Progress Notes (Signed)
Subjective/Complaints: No problems overnight. Happy with progress.  A 12 point review of systems has been performed and if not noted above is otherwise negative.   Objective: Vital Signs: Blood pressure 114/73, pulse 62, temperature 97.2 F (36.2 C), temperature source Oral, resp. rate 19, height 5\' 2"  (1.575 m), weight 91 kg (200 lb 9.9 oz), SpO2 97.00%. No results found. No results found for this basename: WBC:2,HGB:2,HCT:2,PLT:2 in the last 72 hours No results found for this basename: NA:2,K:2,CL:2,CO:2,GLUCOSE:2,BUN:2,CREATININE:2,CALCIUM:2 in the last 72 hours CBG (last 3)   Basename 07/29/12 1127 07/29/12 0740 07/29/12 0717  GLUCAP 141* 79 58*    Wt Readings from Last 3 Encounters:  07/23/12 91 kg (200 lb 9.9 oz)  07/22/12 91.2 kg (201 lb 1 oz)    Physical Exam:  Constitutional: She is oriented to person, place, and time. She appears well-developed and well-nourished.  HENT:  Head: Normocephalic.  Left Ear: External ear normal.  Eyes: Pupils are equal, round, and reactive to light.  Neck: Normal range of motion.  Cardiovascular: Normal rate and regular rhythm.  Pulmonary/Chest: She is CTA bilaterally. No distress Neurological: She is alert and oriented to person, place, and time.  RLE with impaired proprioception At the toes. Decreased sensation to LT below the knees. DTR's 2++ Skin: Skin is warm and dry.  Motor strength is 4/5 in bilateral deltoid, biceps, triceps, grip  2 minus in the left hip flexors 3   knee extensors, right HF 1+, KF 2 to 2+;  1/5 in the right ankle dorsiflexor plantar flexor and 2+ to 3-/5 in the left ankle dorsiflexor plantar flexor- no change   Assessment/Plan: 1. Functional deficits secondary to multiple sclerosis with neurogenic bowel and bladder and thoracic myelopathy which require 3+ hours per day of interdisciplinary therapy in a comprehensive inpatient rehab setting. Physiatrist is providing close team supervision and 24 hour management of  active medical problems listed below. Physiatrist and rehab team continue to assess barriers to discharge/monitor patient progress toward functional and medical goals.  New AFO to be delivered today. FIM: FIM - Bathing Bathing Steps Patient Completed: Chest;Right Arm;Left Arm;Abdomen;Front perineal area;Buttocks;Right upper leg;Left upper leg;Right lower leg (including foot);Left lower leg (including foot) Bathing: 5: Supervision: Safety issues/verbal cues  FIM - Upper Body Dressing/Undressing Upper body dressing/undressing steps patient completed: Thread/unthread right sleeve of pullover shirt/dresss;Thread/unthread left sleeve of pullover shirt/dress;Put head through opening of pull over shirt/dress;Pull shirt over trunk Upper body dressing/undressing: 5: Set-up assist to: Obtain clothing/put away FIM - Lower Body Dressing/Undressing Lower body dressing/undressing steps patient completed: Thread/unthread right underwear leg;Thread/unthread left underwear leg;Pull underwear up/down;Thread/unthread right pants leg;Thread/unthread left pants leg;Pull pants up/down;Don/Doff right sock;Don/Doff left sock;Don/Doff left shoe;Fasten/unfasten left shoe Lower body dressing/undressing: 4: Min-Patient completed 75 plus % of tasks  FIM - Toileting Toileting steps completed by patient: Adjust clothing prior to toileting;Performs perineal hygiene;Adjust clothing after toileting Toileting Assistive Devices: Grab bar or rail for support Toileting: 5: Supervision: Safety issues/verbal cues  FIM - Diplomatic Services operational officer Devices: Bedside commode;Elevated toilet seat;Grab bars Toilet Transfers: 5-To toilet/BSC: Supervision (verbal cues/safety issues);5-From toilet/BSC: Supervision (verbal cues/safety issues)  FIM - Banker Devices:  (leg lifter) Bed/Chair Transfer: 4: Bed > Chair or W/C: Min A (steadying Pt. > 75%);4: Chair or W/C > Bed: Min A  (steadying Pt. > 75%);5: Sit > Supine: Supervision (verbal cues/safety issues);5: Supine > Sit: Supervision (verbal cues/safety issues)  FIM - Locomotion: Wheelchair Distance: 150 Locomotion: Wheelchair: 6: Travels 150 ft or  more, turns around, maneuvers to table, bed or toilet, negotiates 3% grade: maneuvers on rugs and over door sills independently FIM - Locomotion: Ambulation Locomotion: Ambulation Assistive Devices: Walker - Rolling Ambulation/Gait Assistance: 4: Min assist Locomotion: Ambulation: 1: Travels less than 50 ft with minimal assistance (Pt.>75%)  Comprehension Comprehension Mode: Auditory Comprehension: 6-Follows complex conversation/direction: With extra time/assistive device  Expression Expression Mode: Verbal Expression: 6-Expresses complex ideas: With extra time/assistive device  Social Interaction Social Interaction: 6-Interacts appropriately with others with medication or extra time (anti-anxiety, antidepressant).  Problem Solving Problem Solving: 6-Solves complex problems: With extra time  Memory Memory: 6-More than reasonable amt of time  Medical Problem List and Plan:  1. DVT Prophylaxis/Anticoagulation: Pharmaceutical: Lovenox  2. Pain Management: Will continue prn morphine for back pain from coughing spells.  3. Mood:  . Ego support daily by team. Continue cymbalta daily.  4. Neuropsych: This patient is capable of making decisions on his/her own behalf.  5. DM type 2: continue AC/HS CBG checks. BS uncontrolled due to steroids on board. Continue Amaryl and canagliflozin. lantus decreased in pm. Added 5 units in am. Continue to titrate as needed. 6. MS flare: IV solumedrol to prednisone taper.  Continue rebiff 3 X week.  7. Bronchitis with likely PNA: on levaquin. continue anti-tussives. Use nebs prn SOB/wheezing. IS  -cough has decreased greatly 8. Leukocytosis: likely reactive due to PNA as well as steroid effect. No fevers noted and patient clinically  looks better. Will monitor for symptoms of infection. CXR without disease 9. HTN: continue norvasc, avapro and atenolol daily. Monitor BP on bid basis.  10. Neurogenic bladder:     nursing toilet every 4 hours and scan to check bladder volumes. UCS 01/21 without growth.  -ditropan has helped urinary frequency  LOS (Days) 7 A FACE TO FACE EVALUATION WAS PERFORMED  SWARTZ,ZACHARY T 07/30/2012 8:02 AM

## 2012-07-30 NOTE — Plan of Care (Signed)
Problem: SCI BLADDER ELIMINATION Goal: RH STG MANAGE BLADDER WITH ASSISTANCE STG Manage Bladder With Min Assist with timed toileting  Outcome: Progressing No incontinence episode reported at this time.

## 2012-07-30 NOTE — Plan of Care (Signed)
Problem: RH PAIN MANAGEMENT Goal: RH STG PAIN MANAGED AT OR BELOW PT'S PAIN GOAL 3 below  Outcome: Progressing Pain managed with scheduled pain medication

## 2012-07-30 NOTE — Progress Notes (Signed)
Occupational Therapy Session Note  Patient Details  Name: Laura Roberts MRN: [id number] Date of Birth: Jun 10, 1941  Today's Date: 07/30/2012 Time: 1030-1155 Time Calculation (min): 85 min  Short Term Goals: Week 1:  OT Short Term Goal 1 (Week 1): Patient will complete tub/shower transfer with minimal assistance OT Short Term Goal 2 (Week 1): Patient will complete UB/LB bathing with supervision using AE prn OT Short Term Goal 3 (Week 1): Patient will complete LB dressing with min assist using AE prn OT Short Term Goal 4 (Week 1): Patient will complete toilet transfer with supervision using DME prn  Skilled Therapeutic Interventions/Progress Updates:  Patient found seated in w/c beside bed. Patient stated she felt somewhat depressed today, also stating it was probably the side effects of some of her medications. Patient had already gathered items for ADL. Patient propelled self from room -> tub room and engaged in toilet transfer, toileting, and tub/shower transfer onto tub transfer bench. Patient performed transfers at a supervision level, but needed min verbal cues for safety during w/c set-up for transfers. During ADL focused skilled intervention on stand pivot transfers with & without shoes & brace on RLE, sit/stands, dynamic standing balance/tolerance/endurance, UB/LB bathing & dressing, overall activity tolerance/endurance, and energy conservation prn. After ADL patient propelled self -> therapy gym for UE exercise using SCIFIT machine and w/c pushups. Recommend patient complete w/c pushups daily 2-3 sets of 10. Also discussed energy conservation techniques and how patient should conserve energy as needed, but needs to continue and workout/exercise. Therapist propelled patient back to room secondary to patient's complaints of fatigue, left patient seated in w/c.   Precautions:  Precautions Precautions: Fall Required Braces or Orthoses: Other Brace/Splint Other Brace/Splint: right leafspring  AFO, needs repair/update- needs more solid AFO Restrictions Weight Bearing Restrictions: No  See FIM for current functional status  Therapy/Group: Individual Therapy  Grayden Burley,Bernisha 07/30/2012, 12:06 PM

## 2012-07-30 NOTE — Plan of Care (Signed)
Problem: RH SKIN INTEGRITY Goal: RH STG SKIN FREE OF INFECTION/BREAKDOWN Will remain free of infection/skin breakdown with Min Assist.  Outcome: Progressing Skin CDI

## 2012-07-31 ENCOUNTER — Inpatient Hospital Stay (HOSPITAL_COMMUNITY): Payer: Medicare Other

## 2012-07-31 ENCOUNTER — Inpatient Hospital Stay (HOSPITAL_COMMUNITY): Payer: Medicare Other | Admitting: Occupational Therapy

## 2012-07-31 ENCOUNTER — Inpatient Hospital Stay (HOSPITAL_COMMUNITY): Payer: Medicare Other | Admitting: Physical Therapy

## 2012-07-31 DIAGNOSIS — G35 Multiple sclerosis: Secondary | ICD-10-CM

## 2012-07-31 MED ORDER — INSULIN GLARGINE 100 UNIT/ML ~~LOC~~ SOLN
5.0000 [IU] | Freq: Every day | SUBCUTANEOUS | Status: DC
  Administered 2012-07-31 – 2012-08-03 (×4): 5 [IU] via SUBCUTANEOUS

## 2012-07-31 MED ORDER — WHITE PETROLATUM GEL
Status: AC
  Administered 2012-07-31: 0.2
  Filled 2012-07-31: qty 5

## 2012-07-31 NOTE — Progress Notes (Signed)
Physical Therapy Weekly Progress Note  Patient Details  Name: MEKAYLAH KLICH MRN: [id number] Date of Birth: 1941-04-08  Today's Date: 07/31/2012 Time: 4098-1191 Time Calculation (min): 32 min  Patient has met 5 of 5 short term goals.    Patient continues to demonstrate the following deficits: decreased balance, decreased gait, decreased endurance and bil leg strength and therefore will continue to benefit from skilled PT intervention to enhance overall performance with activity tolerance, balance, ability to compensate for deficits, functional use of  right lower extremity and left lower extremity and coordination.  Patient progressing toward long term goals..  Continue plan of care.  PT Short Term Goals Week 1:  PT Short Term Goal 1 (Week 1): bed mobility supervision using assistive device to manage right leg.   PT Short Term Goal 1 - Progress (Week 1): Met PT Short Term Goal 2 (Week 1): transfers to both right and left side stand or squat pivot min assist PT Short Term Goal 2 - Progress (Week 1): Met PT Short Term Goal 3 (Week 1): gait with rollator mod assist with R AFO.   PT Short Term Goal 3 - Progress (Week 1): Met PT Short Term Goal 4 (Week 1): WC mobility >200' for endurance training mod I  PT Short Term Goal 4 - Progress (Week 1): Met PT Short Term Goal 5 (Week 1): Dynamic standing balance during functional task like toileting (managing pants and underwear) or washing hands at sink min assist.   PT Short Term Goal 5 - Progress (Week 1): Met  Skilled Therapeutic Interventions/Progress Updates:    This session focused on WC mobility to and from the gym 150'x 2 mod I level tiled indoor surfaces for endurance and activity tolerance with bil upper extremities.  Gait with RW and new R solid AFO close supervision 25' x 3 with WC to follow to encourage increased gait distance.  Pt reports that she normally uses a rollator at home, but thinks she has a regular RW like the one she is using  here.  Our rollator cannot adjust down in height, but I would like to get hers from home so we can determine if she should use her rollator (this is nice because it has a seat she can sit on when she starts to feel weak) or the regular RW which is less free to steer and turn making it at times easier for her to manage.  Toilet transfer supervision to the toilet with pt using grab bars and managing her own pants and underwear, min assist to get back to [facility] from toilet with min assist to manage pants and underwear due to pt reporting fatigue from all the therapy.  Son, [name] wants to speak with me re: his mom's progress. Ms. Decelle reports this is ok with her. He also requested a home evaluation (trying to request through schedulers).  Left message after treatment session with Tyrone.  Will try again Monday if I do not hear from him today.    Therapy Documentation Precautions:  Precautions Precautions: Fall Required Braces or Orthoses: Other Brace/Splint Other Brace/Splint: R solid AFO Restrictions Weight Bearing Restrictions: No General:   Vital Signs:    See FIM for current functional status  Therapy/Group: Individual Therapy  Lurena Joiner B. Cyera Balboni, PT, DPT [phone]    07/31/2012, 4:45 PM

## 2012-07-31 NOTE — Progress Notes (Signed)
Subjective/Complaints: No new issues. Up in BR already.  A 12 point review of systems has been performed and if not noted above is otherwise negative.   Objective: Vital Signs: Blood pressure 113/64, pulse 64, temperature 97.5 F (36.4 C), temperature source Oral, resp. rate 18, height 5\' 2"  (1.575 m), weight 91 kg (200 lb 9.9 oz), SpO2 99.00%. No results found. No results found for this basename: WBC:2,HGB:2,HCT:2,PLT:2 in the last 72 hours No results found for this basename: NA:2,K:2,CL:2,CO:2,GLUCOSE:2,BUN:2,CREATININE:2,CALCIUM:2 in the last 72 hours CBG (last 3)   Basename 07/29/12 1127 07/29/12 0740 07/29/12 0717  GLUCAP 141* 79 58*    Wt Readings from Last 3 Encounters:  07/23/12 91 kg (200 lb 9.9 oz)  07/22/12 91.2 kg (201 lb 1 oz)    Physical Exam:  Constitutional: She is oriented to person, place, and time. She appears well-developed and well-nourished.  HENT:  Head: Normocephalic.  Left Ear: External ear normal.  Eyes: Pupils are equal, round, and reactive to light.  Neck: Normal range of motion.  Cardiovascular: Normal rate and regular rhythm.  Pulmonary/Chest: She is CTA bilaterally. No distress Neurological: She is alert and oriented to person, place, and time.  RLE with impaired proprioception At the toes. Decreased sensation to LT below the knees. DTR's 2++ Skin: Skin is warm and dry.  Motor strength is 4/5 in bilateral deltoid, biceps, triceps, grip  2 minus in the left hip flexors 3   knee extensors, right HF 1+, KF 2 to 2+;  1/5 in the right ankle dorsiflexor plantar flexor and 2+ to 3-/5 in the left ankle dorsiflexor plantar flexor- no change   Assessment/Plan: 1. Functional deficits secondary to multiple sclerosis with neurogenic bowel and bladder and thoracic myelopathy which require 3+ hours per day of interdisciplinary therapy in a comprehensive inpatient rehab setting. Physiatrist is providing close team supervision and 24 hour management of active  medical problems listed below. Physiatrist and rehab team continue to assess barriers to discharge/monitor patient progress toward functional and medical goals.  New AFO to be brought back today with shoe after adjustments, kick plate addition  FIM: FIM - Bathing Bathing Steps Patient Completed: Chest;Right Arm;Left Arm;Abdomen;Front perineal area;Buttocks;Right upper leg;Left upper leg;Right lower leg (including foot);Left lower leg (including foot) Bathing: 5: Supervision: Safety issues/verbal cues  FIM - Upper Body Dressing/Undressing Upper body dressing/undressing steps patient completed: Thread/unthread right sleeve of pullover shirt/dresss;Thread/unthread left sleeve of pullover shirt/dress;Put head through opening of pull over shirt/dress;Pull shirt over trunk Upper body dressing/undressing: 5: Set-up assist to: Obtain clothing/put away FIM - Lower Body Dressing/Undressing Lower body dressing/undressing steps patient completed: Thread/unthread right pants leg;Thread/unthread left pants leg;Pull pants up/down;Don/Doff right sock;Don/Doff left sock;Don/Doff left shoe;Fasten/unfasten left shoe Lower body dressing/undressing: 4: Min-Patient completed 75 plus % of tasks  FIM - Toileting Toileting steps completed by patient: Adjust clothing prior to toileting;Performs perineal hygiene;Adjust clothing after toileting Toileting Assistive Devices: Grab bar or rail for support Toileting: 5: Supervision: Safety issues/verbal cues  FIM - Diplomatic Services operational officer Devices: Elevated toilet seat;Grab bars Toilet Transfers: 5-To toilet/BSC: Supervision (verbal cues/safety issues);5-From toilet/BSC: Supervision (verbal cues/safety issues)  FIM - Bed/Chair Transfer Bed/Chair Transfer Assistive Devices: Bed rails;Arm rests;Walker;Orthosis Bed/Chair Transfer: 5: Supine > Sit: Supervision (verbal cues/safety issues);4: Bed > Chair or W/C: Min A (steadying Pt. > 75%);4: Chair or W/C >  Bed: Min A (steadying Pt. > 75%)  FIM - Locomotion: Wheelchair Distance: 150 Locomotion: Wheelchair: 6: Travels 150 ft or more, turns around, maneuvers to table,  bed or toilet, negotiates 3% grade: maneuvers on rugs and over door sills independently FIM - Locomotion: Ambulation Locomotion: Ambulation Assistive Devices: Walker - Rolling Ambulation/Gait Assistance: 4: Min assist Locomotion: Ambulation: 1: Travels less than 50 ft with minimal assistance (Pt.>75%)  Comprehension Comprehension Mode: Auditory Comprehension: 6-Follows complex conversation/direction: With extra time/assistive device  Expression Expression Mode: Verbal Expression: 6-Expresses complex ideas: With extra time/assistive device  Social Interaction Social Interaction: 6-Interacts appropriately with others with medication or extra time (anti-anxiety, antidepressant).  Problem Solving Problem Solving: 6-Solves complex problems: With extra time  Memory Memory: 6-More than reasonable amt of time  Medical Problem List and Plan:  1. DVT Prophylaxis/Anticoagulation: Pharmaceutical: Lovenox  2. Pain Management: Will continue prn morphine for back pain from coughing spells.  3. Mood:  . Ego support daily by team. Continue cymbalta daily.  4. Neuropsych: This patient is capable of making decisions on his/her own behalf.  5. DM type 2: continue AC/HS CBG checks. BS uncontrolled due to steroids on board. Continue Amaryl and canagliflozin. lantus changed to 5u bid. Continue to titrate as needed. Am sugars showing improvement 6. MS flare: IV solumedrol to prednisone taper.  Continue rebiff 3 X week.  7. Bronchitis with likely PNA: on levaquin. continue anti-tussives. Use nebs prn SOB/wheezing. IS  -cough has decreased greatly 8. Leukocytosis: likely reactive due to PNA as well as steroid effect. No fevers noted and patient clinically looks better. Will monitor for symptoms of infection. CXR without disease 9. HTN: continue  norvasc, avapro and atenolol daily. Monitor BP on bid basis.  10. Neurogenic bladder:     nursing toilet every 4 hours and scan to check bladder volumes. UCS 01/21 without growth.  -ditropan has helped urinary frequency  LOS (Days) 8 A FACE TO FACE EVALUATION WAS PERFORMED  Larwence Tu T 07/31/2012 7:11 AM

## 2012-07-31 NOTE — Progress Notes (Signed)
Physical Therapy Note  Patient Details  Name: Laura Roberts MRN: [id number] Date of Birth: [birth year] Today's Date: 07/31/2012  Individual session 1010-1203 No complaints of pain Session focused on transfers, balance, LE and trunk strength.  Time spent coordinating timing of delivery of orthosis and pt's shoe with modifications from orthotist.   Patient performed wheechair  Mobility to/from gym x 150'x 2 modified independent.  She transferred to mat to perform LE therex including AAROM hip flexion, knee extensions, stabilized bridging, trunk rotations with and without therapy ball and trunk flexion with legs on therapy ball.  Transferred to rolling stool for hamstring pulls with mod/max assist for safety.  Khaliya Golinski,CYNDI 07/31/2012, 1:27 PM

## 2012-07-31 NOTE — Progress Notes (Signed)
Nutrition Brief Note  Patient seen due to increased length of stay.    Body mass index is 36.69 kg/(m^2). Patient meets criteria for obesity grade 2 based on current BMI.   Current diet order is CHO MOD, patient is consuming approximately 100% of meals at this time. Labs and medications reviewed. Patient states that everything is good and has no needs at this time.  No nutrition interventions warranted at this time. If nutrition issues arise, please consult RD.   Oran Rein, RD, LDN Clinical Inpatient Dietitian Pager:  [phone] Weekend and after hours pager:  [phone]

## 2012-07-31 NOTE — Progress Notes (Signed)
Occupational Therapy Weekly Progress Note & Session Notes  Patient Details  Name: Laura Roberts MRN: [id number] Date of Birth: Nov 11, 1940  Today's Date: 07/31/2012  WEEKLY PROGRESS NOTE Patient has met 4 of 4 short term goals.  Patient is making good progress on CIR. Patient is very motivated and determined to be as independent as possible. Patient continues to require overall supervision for transfers and supervision -> min assist for BADL tasks secondary to decreased dynamic standing balance and decreased safety with w/c set-up prior/post transfers and tasks. Patient will benefit from another week on CIR to reach mod I goals, patient plans to discharge -> home alone.   Patient continues to demonstrate the following deficits: decreased overall activity tolerance/endurance, decreased independence with overall mobility, decreased independence with BADLs & IADL tasks, increased pain,  and therefore will continue to benefit from skilled OT intervention to enhance overall performance with BADL and iADL.  Patient progressing toward long term goals..  Continue plan of care.  OT Short Term Goals Week 1:  OT Short Term Goal 1 (Week 1): Patient will complete tub/shower transfer with minimal assistance OT Short Term Goal 1 - Progress (Week 1): Met OT Short Term Goal 2 (Week 1): Patient will complete UB/LB bathing with supervision using AE prn OT Short Term Goal 2 - Progress (Week 1): Met OT Short Term Goal 3 (Week 1): Patient will complete LB dressing with min assist using AE prn OT Short Term Goal 3 - Progress (Week 1): Met OT Short Term Goal 4 (Week 1): Patient will complete toilet transfer with supervision using DME prn OT Short Term Goal 4 - Progress (Week 1): Met  Week 2:  OT Short Term Goal 1 (Week 2): Short Term Goals = Long Term Goals  Skilled Therapeutic Interventions/Progress Updates:  Balance/vestibular training;Community reintegration;Discharge planning;Disease  mangement/prevention;DME/adaptive equipment instruction;Functional mobility training;Neuromuscular re-education;Pain management;Patient/family education;Psychosocial support;Self Care/advanced ADL retraining;Skin care/wound managment;Therapeutic Activities;Therapeutic Exercise;UE/LE Strength taining/ROM;Wheelchair propulsion/positioning;Splinting/orthotics;UE/LE Coordination activities   Precautions:  Precautions Precautions: Fall Required Braces or Orthoses: Other Brace/Splint Other Brace/Splint: right leafspring AFO, needs repair/update- needs more solid AFO Restrictions Weight Bearing Restrictions: No  See FIM for current functional status  -----------------------------------------------------------------------------------------------------  SESSION NOTES  Session #1 [id number] - 60 Minutes Individual Therapy No complaints of pain Patient found seated in w/c. Patient gathered necessary items for ADL prior to therapist arriving. Patient propelled self from room -> ADL apartment for ADL retraining. Patient engaged in toilet transfer on/off elevated toilet seat (w/c <-> seat using grab bars), patient then performed tub/shower transfer on/off tub transfer bench (patient required min verbal cues for correct w/c placement prior to transfer). Focused skilled intervention on UB/LB bathing & dressing, ADL transfers, safe and effective transfers, sit/stands, dynamic standing balance/tolerance/endurance, overall activity tolerance/endurance, energy conservation prn, and w/c propulsion (patient propelled self <-> room). At end of session therapist allowed patient to propel self back to room independently to perform grooming tasks seated in w/c at sink independently.   Session #2 1330-1410 - 40 Minutes Individual Therapy No complaints of pain Patient found seated in w/c with friend present in room assisting with patients socks. Patient then donned bilateral shoes, needing minimal assistance with right  shoe secondary to brace and tongue. Patient then propelled self from room -> ortho gym for therapist to modify right shoe in order to increase patient's independence with donning shoe. Patient now able to donn right shoe independently. Patient propelled self from ortho gym -> ADL apartment for bed mobility task using leg lifter to  help increase independence; patient required min assist to get RLE into bed. Patient transferred <-> edge of bed at a modified independent level (stand pivot without rolling walker). Patient propelled self back to room independently.   Krishang Reading,Nehal 07/31/2012, 10:59 AM

## 2012-08-01 ENCOUNTER — Inpatient Hospital Stay (HOSPITAL_COMMUNITY): Payer: Medicare Other | Admitting: *Deleted

## 2012-08-01 DIAGNOSIS — G35 Multiple sclerosis: Secondary | ICD-10-CM

## 2012-08-01 NOTE — Progress Notes (Signed)
Patient ID: Laura Roberts, female   DOB: Dec 16, 1940, 72 y.o.   MRN: [id number] Subjective/Complaints: No new issues. Bladder doing better on "new med" A 12 point review of systems has been performed and if not noted above is otherwise negative.   Objective: Vital Signs: Blood pressure 113/64, pulse 64, temperature 97.5 F (36.4 C), temperature source Oral, resp. rate 18, height 5\' 2"  (1.575 m), weight 91 kg (200 lb 9.9 oz), SpO2 99.00%. No results found. No results found for this basename: WBC:2,HGB:2,HCT:2,PLT:2 in the last 72 hours No results found for this basename: NA:2,K:2,CL:2,CO:2,GLUCOSE:2,BUN:2,CREATININE:2,CALCIUM:2 in the last 72 hours CBG (last 3)   Basename 07/29/12 1127 07/29/12 0740 07/29/12 0717  GLUCAP 141* 79 58*    Wt Readings from Last 3 Encounters:  07/23/12 91 kg (200 lb 9.9 oz)  07/22/12 91.2 kg (201 lb 1 oz)    Physical Exam:  Constitutional: She is oriented to person, place, and time. She appears well-developed and well-nourished.  HENT:  Head: Normocephalic.  Left Ear: External ear normal.  Eyes: Pupils are equal, round, and reactive to light.  Neck: Normal range of motion.  Cardiovascular: Normal rate and regular rhythm.  Pulmonary/Chest: She is CTA bilaterally. No distress Neurological: She is alert and oriented to person, place, and time.  RLE with impaired proprioception At the toes. Decreased sensation to LT below the knees. DTR's 2++ Skin: Skin is warm and dry.  Motor strength is 4/5 in bilateral deltoid, biceps, triceps, grip  2 minus in the left hip flexors 3   knee extensors, right HF 1+, KF 2 to 2+;  1/5 in the right ankle dorsiflexor plantar flexor and 2+ to 3-/5 in the left ankle dorsiflexor plantar flexor- no change   Assessment/Plan: 1. Functional deficits secondary to multiple sclerosis with neurogenic bowel and bladder and thoracic myelopathy which require 3+ hours per day of interdisciplinary therapy in a comprehensive inpatient rehab  setting. Physiatrist is providing close team supervision and 24 hour management of active medical problems listed below. Physiatrist and rehab team continue to assess barriers to discharge/monitor patient progress toward functional and medical goals.  New AFO to be brought back today with shoe after adjustments, kick plate addition  FIM: FIM - Bathing Bathing Steps Patient Completed: Chest;Right Arm;Left Arm;Front perineal area;Abdomen;Buttocks;Right upper leg;Left upper leg;Right lower leg (including foot);Left lower leg (including foot) Bathing: 6: Assistive device (Comment)  FIM - Upper Body Dressing/Undressing Upper body dressing/undressing steps patient completed: Thread/unthread right sleeve of pullover shirt/dresss;Thread/unthread left sleeve of pullover shirt/dress;Put head through opening of pull over shirt/dress;Pull shirt over trunk Upper body dressing/undressing: 7: Complete Independence: No helper FIM - Lower Body Dressing/Undressing Lower body dressing/undressing steps patient completed: Thread/unthread right pants leg;Thread/unthread left pants leg;Pull pants up/down;Don/Doff right sock;Don/Doff left sock Lower body dressing/undressing: 5: Supervision: Safety issues/verbal cues  FIM - Toileting Toileting steps completed by patient: Adjust clothing prior to toileting;Performs perineal hygiene;Adjust clothing after toileting Toileting Assistive Devices: Grab bar or rail for support Toileting: 5: Supervision: Safety issues/verbal cues  FIM - Diplomatic Services operational officer Devices: Elevated toilet seat;Grab bars Toilet Transfers: 5-To toilet/BSC: Supervision (verbal cues/safety issues);5-From toilet/BSC: Supervision (verbal cues/safety issues)  FIM - Bed/Chair Transfer Bed/Chair Transfer Assistive Devices: Bed rails;Arm rests;HOB elevated;Walker Bed/Chair Transfer: 5: Supine > Sit: Supervision (verbal cues/safety issues);4: Sit > Supine: Min A (steadying pt. >  75%/lift 1 leg);4: Bed > Chair or W/C: Min A (steadying Pt. > 75%);4: Chair or W/C > Bed: Min A (steadying Pt. > 75%)  FIM - Locomotion: Wheelchair Distance: 125 Locomotion: Wheelchair: 5: Travels 50 - 149 ft, turns around, maneuvers to table, bed or toilet, negotiates 3% grade: modified independent FIM - Locomotion: Ambulation Locomotion: Ambulation Assistive Devices: Designer, industrial/product Ambulation/Gait Assistance: 4: Min assist Locomotion: Ambulation: 0: Activity did not occur  Comprehension Comprehension Mode: Auditory Comprehension: 7-Follows complex conversation/direction: With no assist  Expression Expression Mode: Verbal Expression: 7-Expresses complex ideas: With no assist  Social Interaction Social Interaction: 6-Interacts appropriately with others with medication or extra time (anti-anxiety, antidepressant).  Problem Solving Problem Solving: 5-Solves complex 90% of the time/cues < 10% of the time  Memory Memory: 5-Recognizes or recalls 90% of the time/requires cueing < 10% of the time  Medical Problem List and Plan:  1. DVT Prophylaxis/Anticoagulation: Pharmaceutical: Lovenox  2. Pain Management: Will continue prn morphine for back pain from coughing spells.  3. Mood:  . Ego support daily by team. Continue cymbalta daily.  4. Neuropsych: This patient is capable of making decisions on his/her own behalf.  5. DM type 2: continue AC/HS CBG checks. BS uncontrolled due to steroids on board. Continue Amaryl and canagliflozin. lantus changed to 5u bid. Continue to titrate as needed. Am sugars showing improvement 6. MS flare: IV solumedrol to prednisone taper.  Continue rebiff 3 X week.  7. Bronchitis with likely PNA: on levaquin. continue anti-tussives. Use nebs prn SOB/wheezing. IS  -cough has decreased greatly 8. Leukocytosis: likely reactive due to PNA as well as steroid effect. No fevers noted and patient clinically looks better. Will monitor for symptoms of infection. CXR  without disease 9. HTN: continue norvasc, avapro and atenolol daily. Monitor BP on bid basis.  10. Neurogenic bladder:     nursing toilet every 4 hours and scan to check bladder volumes. UCS 01/21 without growth.  -ditropan has helped urinary frequency  LOS (Days) 9 A FACE TO FACE EVALUATION WAS PERFORMED  Erick Colace 08/01/2012 6:53 AM

## 2012-08-01 NOTE — Progress Notes (Signed)
Occupational Therapy Note  Patient Details  Name: Laura Roberts MRN: [id number] Date of Birth: May 04, 1941 Today's Date: 08/01/2012 Time:  1445-1530  ( ) Individual session Pain:  None  Engaged in therapeutic activities including transfers to various heights, wc mobility, wc set up for transfers.  Pt. propelled self from room to ADL apartment.  Educated pt on proper set up for wc when moving from one place to another.  Pt verbalized understanding.  Transferred from wc to sofa; wc to dining room chair; wc to bed.  Pt.  Was minimal assist with sit to stand and stand pivot transfer with guarding the right lower extremity and assisting with rotation.  Pt. tolerated session with increased fatigue near end of session.     Humberto Seals 08/01/2012, 3:24 PM

## 2012-08-02 NOTE — Progress Notes (Signed)
Patient ID: Laura Roberts, female   DOB: 01-16-1941, 72 y.o.   MRN: [id number] Subjective/Complaints: No new issues. Slept ok, no pain A 12 point review of systems has been performed and if not noted above is otherwise negative.   Objective: Vital Signs: Blood pressure 119/76, pulse 66, temperature 97 F (36.1 C), temperature source Oral, resp. rate 19, height 5\' 2"  (1.575 m), weight 91 kg (200 lb 9.9 oz), SpO2 96.00%. No results found. No results found for this basename: WBC:2,HGB:2,HCT:2,PLT:2 in the last 72 hours No results found for this basename: NA:2,K:2,CL:2,CO:2,GLUCOSE:2,BUN:2,CREATININE:2,CALCIUM:2 in the last 72 hours CBG (last 3)  No results found for this basename: GLUCAP:3 in the last 72 hours  Wt Readings from Last 3 Encounters:  07/23/12 91 kg (200 lb 9.9 oz)  07/22/12 91.2 kg (201 lb 1 oz)    Physical Exam:  Constitutional: She is oriented to person, place, and time. She appears well-developed and well-nourished.  HENT:  Head: Normocephalic.  Left Ear: External ear normal.  Eyes: Pupils are equal, round, and reactive to light.  Neck: Normal range of motion.  Cardiovascular: Normal rate and regular rhythm.  Pulmonary/Chest: She is CTA bilaterally. No distress Neurological: She is alert and oriented to person, place, and time.  RLE with impaired proprioception At the toes. Decreased sensation to LT below the knees. DTR's 2++ Skin: Skin is warm and dry.  Motor strength is 4/5 in bilateral deltoid, biceps, triceps, grip  2 minus in the left hip flexors 3   knee extensors, right HF 1+, KF 2 to 2+;  1/5 in the right ankle dorsiflexor plantar flexor and 2+ to 3-/5 in the left ankle dorsiflexor plantar flexor- no change   Assessment/Plan: 1. Functional deficits secondary to multiple sclerosis with neurogenic bowel and bladder and thoracic myelopathy which require 3+ hours per day of interdisciplinary therapy in a comprehensive inpatient rehab setting. Physiatrist is  providing close team supervision and 24 hour management of active medical problems listed below. Physiatrist and rehab team continue to assess barriers to discharge/monitor patient progress toward functional and medical goals.  New AFO to be brought back today with shoe after adjustments, kick plate addition  FIM: FIM - Bathing Bathing Steps Patient Completed: Chest;Right Arm;Left Arm;Front perineal area;Abdomen;Buttocks;Right upper leg;Left upper leg;Right lower leg (including foot);Left lower leg (including foot) Bathing: 6: Assistive device (Comment)  FIM - Upper Body Dressing/Undressing Upper body dressing/undressing steps patient completed: Thread/unthread right sleeve of pullover shirt/dresss;Thread/unthread left sleeve of pullover shirt/dress;Put head through opening of pull over shirt/dress;Pull shirt over trunk Upper body dressing/undressing: 7: Complete Independence: No helper FIM - Lower Body Dressing/Undressing Lower body dressing/undressing steps patient completed: Thread/unthread right pants leg;Thread/unthread left pants leg;Pull pants up/down;Don/Doff right sock;Don/Doff left sock Lower body dressing/undressing: 5: Supervision: Safety issues/verbal cues  FIM - Toileting Toileting steps completed by patient: Adjust clothing prior to toileting;Performs perineal hygiene;Adjust clothing after toileting Toileting Assistive Devices: Grab bar or rail for support Toileting: 5: Supervision: Safety issues/verbal cues  FIM - Diplomatic Services operational officer Devices: Elevated toilet seat;Grab bars Toilet Transfers: 5-To toilet/BSC: Supervision (verbal cues/safety issues);5-From toilet/BSC: Supervision (verbal cues/safety issues)  FIM - Bed/Chair Transfer Bed/Chair Transfer Assistive Devices: Bed rails;Arm rests;HOB elevated;Walker Bed/Chair Transfer: 5: Supine > Sit: Supervision (verbal cues/safety issues);4: Sit > Supine: Min A (steadying pt. > 75%/lift 1 leg);4: Bed >  Chair or W/C: Min A (steadying Pt. > 75%);4: Chair or W/C > Bed: Min A (steadying Pt. > 75%)  FIM - Locomotion: Wheelchair Distance:  125 Locomotion: Wheelchair: 5: Travels 50 - 149 ft, turns around, maneuvers to table, bed or toilet, negotiates 3% grade: modified independent FIM - Locomotion: Ambulation Locomotion: Ambulation Assistive Devices: Designer, industrial/product Ambulation/Gait Assistance: 4: Min assist Locomotion: Ambulation: 0: Activity did not occur  Comprehension Comprehension Mode: Auditory Comprehension: 7-Follows complex conversation/direction: With no assist  Expression Expression Mode: Verbal Expression: 7-Expresses complex ideas: With no assist  Social Interaction Social Interaction: 6-Interacts appropriately with others with medication or extra time (anti-anxiety, antidepressant).  Problem Solving Problem Solving: 5-Solves complex 90% of the time/cues < 10% of the time  Memory Memory: 5-Recognizes or recalls 90% of the time/requires cueing < 10% of the time  Medical Problem List and Plan:  1. DVT Prophylaxis/Anticoagulation: Pharmaceutical: Lovenox  2. Pain Management: Will continue prn morphine for back pain from coughing spells.  3. Mood:  . Ego support daily by team. Continue cymbalta daily.  4. Neuropsych: This patient is capable of making decisions on his/her own behalf.  5. DM type 2: continue AC/HS CBG checks. BS uncontrolled due to steroids on board. Continue Amaryl and canagliflozin. lantus changed to 5u bid. Continue to titrate as needed. Am sugars showing improvement 6. MS flare: IV solumedrol to prednisone taper.  Continue rebiff 3 X week.  7. Bronchitis with likely PNA: on levaquin. continue anti-tussives. Use nebs prn SOB/wheezing. IS  -cough has decreased greatly 8. Leukocytosis: likely reactive due to PNA as well as steroid effect. No fevers noted and patient clinically looks better. Will monitor for symptoms of infection. CXR without disease 9. HTN:  continue norvasc, avapro and atenolol daily. Monitor BP on bid basis.  10. Neurogenic bladder:     nursing toilet every 4 hours and scan to check bladder volumes. UCS 01/21 without growth.  -ditropan has helped urinary frequency  LOS (Days) 10 A FACE TO FACE EVALUATION WAS PERFORMED  Erick Colace 08/02/2012 7:42 AM

## 2012-08-02 NOTE — Progress Notes (Signed)
At 0250 patient complained of feeling, "funny" after getting up to [facility] Requested CBG check, CBG=122. Assisted back to bed and reported feeling better. At 0341 PRN robitussin given. Alfredo Martinez A

## 2012-08-03 ENCOUNTER — Inpatient Hospital Stay (HOSPITAL_COMMUNITY): Payer: Medicare Other | Admitting: Occupational Therapy

## 2012-08-03 ENCOUNTER — Inpatient Hospital Stay (HOSPITAL_COMMUNITY): Payer: Medicare Other | Admitting: Physical Therapy

## 2012-08-03 MED ORDER — BENZONATATE 100 MG PO CAPS
100.0000 mg | ORAL_CAPSULE | Freq: Three times a day (TID) | ORAL | Status: DC | PRN
  Administered 2012-08-04 – 2012-08-06 (×5): 100 mg via ORAL
  Filled 2012-08-03 (×5): qty 1

## 2012-08-03 NOTE — Progress Notes (Signed)
Physical Therapy Note  Patient Details  Name: Laura Roberts MRN: [id number] Date of Birth: [birth year] Today's Date: 08/03/2012  1415-1455 (40 minutes) individual Pain: no reported pain Focus of treatment: Neuro re-ed bilateral LE Treatment: Neuro re-ed bilateral LE as follows using suspension grid (gravity eliminated) : RT hip flexion (sidelying) approximately 1/3 range actively; hip abduction (supine) approximately 1/4range ; AA  SLR on left; hip ER/IR in hooklying  Jacob Chamblee,JIM 08/03/2012, 7:24 AM

## 2012-08-03 NOTE — Plan of Care (Signed)
Problem: RH SKIN INTEGRITY Goal: RH STG SKIN FREE OF INFECTION/BREAKDOWN Will remain free of infection/skin breakdown with Assist. Mod I Outcome: Progressing No skin breakdown at this time

## 2012-08-03 NOTE — Progress Notes (Signed)
Patient ID: Laura Roberts, female   DOB: January 21, 1941, 72 y.o.   MRN: [id number] Subjective/Complaints: No new issues. Slept ok, no pain A 12 point review of systems has been performed and if not noted above is otherwise negative.   Objective: Vital Signs: Blood pressure 114/68, pulse 64, temperature 97.8 F (36.6 C), temperature source Oral, resp. rate 20, height 5\' 2"  (1.575 m), weight 91 kg (200 lb 9.9 oz), SpO2 95.00%. No results found. No results found for this basename: WBC:2,HGB:2,HCT:2,PLT:2 in the last 72 hours No results found for this basename: NA:2,K:2,CL:2,CO:2,GLUCOSE:2,BUN:2,CREATININE:2,CALCIUM:2 in the last 72 hours CBG (last 3)  No results found for this basename: GLUCAP:3 in the last 72 hours  Wt Readings from Last 3 Encounters:  07/23/12 91 kg (200 lb 9.9 oz)  07/22/12 91.2 kg (201 lb 1 oz)    Physical Exam:  Constitutional: She is oriented to person, place, and time. She appears well-developed and well-nourished.  HENT:  Head: Normocephalic.  Left Ear: External ear normal.  Eyes: Pupils are equal, round, and reactive to light.  Neck: Normal range of motion.  Cardiovascular: Normal rate and regular rhythm.  Pulmonary/Chest: She is CTA bilaterally. No distress Neurological: She is alert and oriented to person, place, and time.  RLE with impaired proprioception At the toes. Decreased sensation to LT below the knees. DTR's 2++ Skin: Skin is warm and dry.  Motor strength is 4/5 in bilateral deltoid, biceps, triceps, grip  2 minus in the left hip flexors 3   knee extensors, right HF 1+, KF 2 to 2+;  1/5 in the right ankle dorsiflexor plantar flexor and 2+ to 3-/5 in the left ankle dorsiflexor plantar flexor- no change   Assessment/Plan: 1. Functional deficits secondary to multiple sclerosis with neurogenic bowel and bladder and thoracic myelopathy which require 3+ hours per day of interdisciplinary therapy in a comprehensive inpatient rehab setting. Physiatrist is  providing close team supervision and 24 hour management of active medical problems listed below. Physiatrist and rehab team continue to assess barriers to discharge/monitor patient progress toward functional and medical goals.  New AFO to be brought back today with shoe after adjustments, kick plate addition  FIM: FIM - Bathing Bathing Steps Patient Completed: Chest;Left Arm;Right Arm;Abdomen;Front perineal area;Buttocks;Right upper leg;Left upper leg;Right lower leg (including foot);Left lower leg (including foot) Bathing: 6: Assistive device (Comment)  FIM - Upper Body Dressing/Undressing Upper body dressing/undressing steps patient completed: Thread/unthread left sleeve of pullover shirt/dress;Thread/unthread right sleeve of pullover shirt/dresss;Put head through opening of pull over shirt/dress;Pull shirt over trunk Upper body dressing/undressing: 7: Complete Independence: No helper FIM - Lower Body Dressing/Undressing Lower body dressing/undressing steps patient completed: Thread/unthread right pants leg;Thread/unthread left pants leg;Pull pants up/down;Don/Doff right sock;Don/Doff left sock Lower body dressing/undressing: 3: Mod-Patient completed 50-74% of tasks (secondary to time)  FIM - Toileting Toileting steps completed by patient: Adjust clothing prior to toileting;Performs perineal hygiene;Adjust clothing after toileting Toileting Assistive Devices: Grab bar or rail for support Toileting: 6: Assistive device: No helper  FIM - Diplomatic Services operational officer Devices: Elevated toilet seat;Grab bars Toilet Transfers: 6-Assistive device: No helper  FIM - Banker Devices: Bed rails;Arm rests;HOB elevated;Walker Bed/Chair Transfer: 5: Supine > Sit: Supervision (verbal cues/safety issues);5: Bed > Chair or W/C: Supervision (verbal cues/safety issues)  FIM - Locomotion: Wheelchair Distance: 125 Locomotion: Wheelchair: 5: Travels  50 - 149 ft, turns around, maneuvers to table, bed or toilet, negotiates 3% grade: modified independent FIM - Locomotion: Ambulation Locomotion:  Ambulation Assistive Devices: Designer, industrial/product Ambulation/Gait Assistance: 4: Min assist Locomotion: Ambulation: 0: Activity did not occur  Comprehension Comprehension Mode: Auditory Comprehension: 7-Follows complex conversation/direction: With no assist  Expression Expression Mode: Verbal Expression: 7-Expresses complex ideas: With no assist  Social Interaction Social Interaction: 7-Interacts appropriately with others - No medications needed.  Problem Solving Problem Solving: 5-Solves complex 90% of the time/cues < 10% of the time  Memory Memory: 7-Complete Independence: No helper  Medical Problem List and Plan:  1. DVT Prophylaxis/Anticoagulation: Pharmaceutical: Lovenox  2. Pain Management: decrease morphine as possible 3. Mood:  . Ego support daily by team. Continue cymbalta daily.  4. Neuropsych: This patient is capable of making decisions on his/her own behalf.  5. DM type 2: continue AC/HS CBG checks. BS previously uncontrolled due to steroids on board. Continue Amaryl and canagliflozin. lantus changed to 5u bid. Probably can dc. Need consistent cbg checks 6. MS flare:steroids completed  Continue rebiff 3 X week.  7. Bronchitis with likely PNA: completed levaquin. continue anti-tussives. Use nebs prn SOB/wheezing. IS  -cough has generally improved 8. Leukocytosis: likely reactive due to PNA as well as steroid effect. No fevers noted and patient clinically looks better. Will monitor for symptoms of infection. CXR without disease 9. HTN: continue norvasc, avapro and atenolol daily. Monitor BP on bid basis.  10. Neurogenic bladder:   Regular toileting  -ditropan has helped urinary frequency  LOS (Days) 11 A FACE TO FACE EVALUATION WAS PERFORMED  Violet Seabury T 08/03/2012 9:51 AM

## 2012-08-03 NOTE — Progress Notes (Signed)
Occupational Therapy Session Note  Patient Details  Name: Laura Roberts MRN: [id number] Date of Birth: 03/14/1941  Today's Date: 08/03/2012 Time: 1117-1200 Time Calculation (min): 43 min  Skilled Therapeutic Interventions/Progress Updates:    1:1 focus on practicing donning and doffing right shoe with AFO (proping it up on recliner for support to aid with donning); toilet transfer with BSC over the commode with supervision and steady A with clothing management in standing with one UE always support on surface, bed mobility with bed adjusted to similar to height at home- pt able to perform with supervision with verbal cue for setup. Pt able to don and doff leg rest mod I .  Therapy Documentation Precautions:  Precautions Precautions: Fall Required Braces or Orthoses: Other Brace/Splint Other Brace/Splint: R solid AFO Restrictions Weight Bearing Restrictions: No Pain: Pain Assessment Pain Assessment: No/denies pain Pain Score: 0-No pain  See FIM for current functional status  Therapy/Group: Individual Therapy  Jamine, [facility] 08/03/2012, 12:22 PM

## 2012-08-03 NOTE — Plan of Care (Signed)
Problem: SCI BLADDER ELIMINATION Goal: RH STG MANAGE BLADDER WITH ASSISTANCE STG Manage Bladder With Min Assist with timed toileting  Outcome: Progressing No incontinent episode reported

## 2012-08-03 NOTE — Progress Notes (Signed)
Physical Therapy Session Note  Patient Details  Name: Laura Roberts MRN: [id number] Date of Birth: 1941/01/30  Today's Date: 08/03/2012 Time: 0910-0958 Time Calculation (min): 48 min  Short Term Goals: Week 2:  PT Short Term Goal 1 (Week 2): STGs=LTGs  Skilled Therapeutic Interventions/Progress Updates:   This session focused on WC mobility 150' x 2 mod I both tiled and carpeted indoor surfaces.  Gait with Rollator with chair to follow closely min assist due to this was the first day we are re-trying rollator vs traditional RW and pt needed a little more assist to control the walker.  She needed support for balance and cues to stay closer to the rollator to help with posture and keeping trunk over feet.  She was not able to walk quite as far today as she did on Friday at one time, but her overall length of gait was increased. She walked about 80' at one time for a total of 90'.  Transfers into and out of WC to high bed in ADL apartment  Supervision with rollator for support.  Sit to supine supervision took extra time for her to manage bil legs and get straightened out in the bed, but she was able to do so without external assist.  Supine to sit also supervision with extra time needed to manage legs and get scooted to EOB.  I was able to speak (with the pt's permission) to her son, [name] over the phone and update him on his mother's progress and answer questions he had about her transition home and keeping her safe at home while utilizing all the resources available to her once she leaves the hospital.    Therapy Documentation Precautions:  Precautions Precautions: Fall Required Braces or Orthoses: Other Brace/Splint Other Brace/Splint: R solid AFO Restrictions Weight Bearing Restrictions: No    Locomotion : Ambulation Ambulation/Gait Assistance: 4: Min assist Wheelchair Mobility Distance: 150   See FIM for current functional status  Therapy/Group: Individual Therapy  Lurena Joiner B.  Briseyda Fehr, PT, DPT [phone]   08/03/2012, 4:45 PM

## 2012-08-03 NOTE — Progress Notes (Signed)
Occupational Therapy Session Note  Patient Details  Name: Laura Roberts MRN: [id number] Date of Birth: 1941-05-08  Today's Date: 08/03/2012 Time: 0730-0830 Time Calculation (min): 60 min  Short Term Goals: Week 1:  OT Short Term Goal 1 (Week 1): Patient will complete tub/shower transfer with minimal assistance OT Short Term Goal 1 - Progress (Week 1): Met OT Short Term Goal 2 (Week 1): Patient will complete UB/LB bathing with supervision using AE prn OT Short Term Goal 2 - Progress (Week 1): Met OT Short Term Goal 3 (Week 1): Patient will complete LB dressing with min assist using AE prn OT Short Term Goal 3 - Progress (Week 1): Met OT Short Term Goal 4 (Week 1): Patient will complete toilet transfer with supervision using DME prn OT Short Term Goal 4 - Progress (Week 1): Met  Week 2:  OT Short Term Goal 1 (Week 2): Short Term Goals = Long Term Goals  Skilled Therapeutic Interventions/Progress Updates:  Patient found supine in bed eating breakfast. Patient engaged in bed mobility with supervision and transferred edge of bed -> w/c. Patient propelled self into bathroom for toilet transfer and toileting (completed at modified independent level using elevated toilet seat and grab bars prn). Patient then propelled self from room -> ADL apartment for shower stall transfer and ADL retraining at shower level. Patient transferred out of shower and sat in w/c for UB/LB dressing. Focused skilled intervention on w/c propulsion, various stand pivot transfers, w/c positioning for transfers, UB/LB bathing & dressing, overall activity tolerance/endurance, and energy conservation techniques prn. At end of session patient propelled self back to room independently.   Precautions:  Precautions Precautions: Fall Required Braces or Orthoses: Other Brace/Splint Other Brace/Splint: R solid AFO Restrictions Weight Bearing Restrictions: No  See FIM for current functional status  Therapy/Group: Individual  Therapy  Montrell Cessna,Narya 08/03/2012, 8:49 AM

## 2012-08-04 ENCOUNTER — Inpatient Hospital Stay (HOSPITAL_COMMUNITY): Payer: Medicare Other | Admitting: Physical Therapy

## 2012-08-04 ENCOUNTER — Inpatient Hospital Stay (HOSPITAL_COMMUNITY): Payer: Medicare Other

## 2012-08-04 ENCOUNTER — Inpatient Hospital Stay (HOSPITAL_COMMUNITY): Payer: Medicare Other | Admitting: Occupational Therapy

## 2012-08-04 LAB — URINALYSIS, ROUTINE W REFLEX MICROSCOPIC
Bilirubin Urine: NEGATIVE
Glucose, UA: >1000 mg/dL — AB
Hgb urine dipstick: NEGATIVE
Ketones, ur: NEGATIVE mg/dL
Protein, ur: NEGATIVE mg/dL
Urobilinogen, UA: 1 mg/dL (ref 0.0–1.0)

## 2012-08-04 LAB — GLUCOSE, CAPILLARY
Glucose-Capillary: 361 mg/dL — ABNORMAL HIGH (ref 70–99)
Glucose-Capillary: 160 mg/dL — ABNORMAL HIGH (ref 70–99)
Glucose-Capillary: 70 mg/dL (ref 70–99)
Glucose-Capillary: 185 mg/dL — ABNORMAL HIGH (ref 70–99)
Glucose-Capillary: 229 mg/dL — ABNORMAL HIGH (ref 70–99)
Glucose-Capillary: 122 mg/dL — ABNORMAL HIGH (ref 70–99)
Glucose-Capillary: 88 mg/dL (ref 70–99)
Glucose-Capillary: 184 mg/dL — ABNORMAL HIGH (ref 70–99)
Glucose-Capillary: 88 mg/dL (ref 70–99)
Glucose-Capillary: 124 mg/dL — ABNORMAL HIGH (ref 70–99)
Glucose-Capillary: 148 mg/dL — ABNORMAL HIGH (ref 70–99)
Glucose-Capillary: 185 mg/dL — ABNORMAL HIGH (ref 70–99)

## 2012-08-04 LAB — CBC
HCT: 40.1 % (ref 36.0–46.0)
MCHC: 32.9 g/dL (ref 30.0–36.0)
Platelets: 232 10*3/uL (ref 150–400)
RDW: 15.6 % — ABNORMAL HIGH (ref 11.5–15.5)
WBC: 13.7 10*3/uL — ABNORMAL HIGH (ref 4.0–10.5)

## 2012-08-04 LAB — URINE MICROSCOPIC-ADD ON

## 2012-08-04 LAB — BASIC METABOLIC PANEL
BUN: 17 mg/dL (ref 6–23)
Calcium: 9.4 mg/dL (ref 8.4–10.5)
Creatinine, Ser: 0.91 mg/dL (ref 0.50–1.10)
GFR calc Af Amer: 73 mL/min — ABNORMAL LOW (ref >90)
GFR calc non Af Amer: 63 mL/min — ABNORMAL LOW (ref >90)

## 2012-08-04 MED ORDER — METFORMIN HCL ER 500 MG PO TB24
500.0000 mg | ORAL_TABLET | Freq: Every day | ORAL | Status: DC
  Administered 2012-08-04 – 2012-08-06 (×3): 500 mg via ORAL
  Filled 2012-08-04 (×5): qty 1

## 2012-08-04 MED ORDER — ACETAMINOPHEN 325 MG PO TABS
650.0000 mg | ORAL_TABLET | ORAL | Status: DC | PRN
Start: 2012-08-04 — End: 2012-08-07
  Administered 2012-08-04 – 2012-08-05 (×3): 650 mg via ORAL
  Filled 2012-08-04 (×3): qty 2

## 2012-08-04 MED ORDER — PREDNISONE 20 MG PO TABS
20.0000 mg | ORAL_TABLET | ORAL | Status: AC
  Administered 2012-08-04: 20 mg via ORAL
  Filled 2012-08-04: qty 1

## 2012-08-04 NOTE — Progress Notes (Signed)
Physical Therapy Session Note  Patient Details  Name: Laura Roberts MRN: [id number] Date of Birth: May 15, 1941  Today's Date: 08/04/2012 Time: 1033-1052 Time Calculation (min): 19 min  Short Term Goals: Week 2:  PT Short Term Goal 1 (Week 2): STGs=LTGs  Skilled Therapeutic Interventions/Progress Updates:    Pt had limited session today.  We were scheduled to go to Ms. Belmar's home for a home eval, but this morning she woke up not feeling well.  Per pt report her blood sugars were low, her BP was low and her back is hurting her 8/10 pain.  She generally looks lethargic and weak compared to her normal presentation.  Pt reports that PA told her to only do what she felt up for doing today.  She was in bathroom having a BM when therapist entered the room, so she was assisted off of the toilet using grab bars min assist, transfer toilet to WC min assist due to increased weakness in knees.  Assist needed to help pull up briefs and pants.  WC mobility in room 15' mod I.  Transfer (180 degree) from [facility] to recliner chair min assist with use of armrests on WC and recliner chair with assist needed at trunk over shaky knees.  Pt positioned with feet up in recliner chair to rest.  She hopes to be able to participate more during OT session later this afternoon.  Missed time: 71 min  Therapy Documentation Precautions:  Precautions Precautions: Fall Required Braces or Orthoses: Other Brace/Splint Other Brace/Splint: R solid AFO Restrictions Weight Bearing Restrictions: No General: Amount of Missed PT Time (min): 71 Minutes Missed Time Reason: Patient ill (comment) (weak, pain) Vital Signs: Therapy Vitals BP: 90/63 mmHg Pain: Pain Assessment Pain Assessment: 0-10 Pain Score:   5 Pain Type: Acute pain Pain Location: Back Pain Orientation: Lower Pain Descriptors: Aching Pain Frequency: Intermittent Pain Onset: Gradual Patients Stated Pain Goal: 3 Pain Intervention(s): Medication (See  eMAR) Mobility:   Locomotion : Wheelchair Mobility Distance: 15  Trunk/Postural Assessment :    Balance:   Exercises:   Other Treatments:    See FIM for current functional status  Therapy/Group: Co-Treatment  Eldred Sooy B. Tynika Luddy, PT, DPT [phone]   08/04/2012, 12:00 PM

## 2012-08-04 NOTE — Progress Notes (Signed)
Patient complained of pain to her lower back and dizziness, BP checked 90/63.  Delle Reining, PA made aware.  Ordered for K-pad to her back prn, tylenol administered at 0849.  Encourage patient to rest, home evaluation put on hold until tomorrow per Rinaldo Cloud.  Recheck BP at 1555 118/65, patient states she feels much better.  Will continue to monitor.

## 2012-08-04 NOTE — Progress Notes (Addendum)
Occupational Therapy Session Note  Patient Details  Name: Laura Roberts MRN: [id number] Date of Birth: [birth year]  Today's Date: 08/04/2012 Time: 0800-0855 Time Calculation (min): 55 min  Short Term Goals: Week 1:  OT Short Term Goal 1 (Week 1): Patient will complete tub/shower transfer with minimal assistance OT Short Term Goal 1 - Progress (Week 1): Met OT Short Term Goal 2 (Week 1): Patient will complete UB/LB bathing with supervision using AE prn OT Short Term Goal 2 - Progress (Week 1): Met OT Short Term Goal 3 (Week 1): Patient will complete LB dressing with min assist using AE prn OT Short Term Goal 3 - Progress (Week 1): Met OT Short Term Goal 4 (Week 1): Patient will complete toilet transfer with supervision using DME prn OT Short Term Goal 4 - Progress (Week 1): Met  Week 2:  OT Short Term Goal 1 (Week 2): Short Term Goals = Long Term Goals  Skilled Therapeutic Interventions/Progress Updates:  Patient found seated in w/c upon entering room with clothes set out for ADL. Patient with complaints of back pain during movement this am (7/10 pain - RN aware). Patient propelled self from room -> tub room for tub/shower transfer onto tub transfer bench. Patient transferred onto bench, then with excruciating pain and patient stated she needed to get back into w/c. Therapist propelled patient back to room and patient with statement, "I feel dizzy, I feel like I'm going to pass out". Patient refused to get back to bed at this time, therapist notified RN and checked BP=90/58. Therapist discussed importance of getting back to bed especially if patient was home alone; patient agreed and stated she would lay down if she were home. Patient adamant to continue with therapies and complete home evaluation later this am. At end of session left patient seated in w/c at sink with call bell & phone within reach and RN aware of situation.   Precautions:  Precautions Precautions: Fall Required Braces or  Orthoses: Other Brace/Splint Other Brace/Splint: R solid AFO Restrictions Weight Bearing Restrictions: No  See FIM for current functional status  Therapy/Group: Individual Therapy  [name],[name] 08/04/2012, 9:00 AM

## 2012-08-04 NOTE — Progress Notes (Signed)
Patient ID: JAMIAH HOMEYER, female   DOB: [birth year], 72 y.o.   MRN: [id number] Subjective/Complaints: No new problems. Occasional cough.  Brace fitting well. A 12 point review of systems has been performed and if not noted above is otherwise negative.   Objective: Vital Signs: Blood pressure 151/88, pulse 67, temperature 98.4 F (36.9 C), temperature source Oral, resp. rate 19, height 5\' 2"  (1.575 m), weight 91 kg (200 lb 9.9 oz), SpO2 98.00%. No results found. No results found for this basename: WBC:2,HGB:2,HCT:2,PLT:2 in the last 72 hours No results found for this basename: NA:2,K:2,CL:2,CO:2,GLUCOSE:2,BUN:2,CREATININE:2,CALCIUM:2 in the last 72 hours CBG (last 3)   Basename 08/04/12 0728 08/03/12 2047  GLUCAP 87 181*    Wt Readings from Last 3 Encounters:  07/23/12 91 kg (200 lb 9.9 oz)  07/22/12 91.2 kg (201 lb 1 oz)    Physical Exam:  Constitutional: She is oriented to person, place, and time. She appears well-developed and well-nourished.  HENT:  Head: Normocephalic.  Left Ear: External ear normal.  Eyes: Pupils are equal, round, and reactive to light.  Neck: Normal range of motion.  Cardiovascular: Normal rate and regular rhythm.  Pulmonary/Chest: She is CTA bilaterally. No distress Neurological: She is alert and oriented to person, place, and time.  RLE with impaired proprioception At the toes. Decreased sensation to LT below the knees. DTR's 2++ Skin: Skin is warm and dry.  Motor strength is 4/5 in bilateral deltoid, biceps, triceps, grip  2 minus in the left hip flexors 3   knee extensors, right HF 1+, KF 2 to 2+;  1/5 in the right ankle dorsiflexor plantar flexor and 2+ to 3-/5 in the left ankle dorsiflexor plantar flexor- no change. Right AFO fiting appropriately   Assessment/Plan: 1. Functional deficits secondary to multiple sclerosis with neurogenic bowel and bladder and thoracic myelopathy which require 3+ hours per day of interdisciplinary therapy in a  comprehensive inpatient rehab setting. Physiatrist is providing close team supervision and 24 hour management of active medical problems listed below. Physiatrist and rehab team continue to assess barriers to discharge/monitor patient progress toward functional and medical goals.  Discuss dispo with team FIM: FIM - Bathing Bathing Steps Patient Completed: Chest;Left Arm;Right Arm;Abdomen;Front perineal area;Buttocks;Right upper leg;Left upper leg;Right lower leg (including foot);Left lower leg (including foot) Bathing: 6: Assistive device (Comment)  FIM - Upper Body Dressing/Undressing Upper body dressing/undressing steps patient completed: Thread/unthread left sleeve of pullover shirt/dress;Thread/unthread right sleeve of pullover shirt/dresss;Put head through opening of pull over shirt/dress;Pull shirt over trunk Upper body dressing/undressing: 7: Complete Independence: No helper FIM - Lower Body Dressing/Undressing Lower body dressing/undressing steps patient completed: Thread/unthread right pants leg;Thread/unthread left pants leg;Pull pants up/down;Don/Doff right sock;Don/Doff left sock Lower body dressing/undressing: 3: Mod-Patient completed 50-74% of tasks (secondary to time)  FIM - Toileting Toileting steps completed by patient: Adjust clothing prior to toileting;Performs perineal hygiene;Adjust clothing after toileting Toileting Assistive Devices: Grab bar or rail for support Toileting: 6: Assistive device: No helper  FIM - Diplomatic Services operational officer Devices: Elevated toilet seat;Grab bars Toilet Transfers: 6-Assistive device: No helper  FIM - Banker Devices: Arm rests Bed/Chair Transfer: 5: Sit > Supine: Supervision (verbal cues/safety issues);5: Supine > Sit: Supervision (verbal cues/safety issues);5: Bed > Chair or W/C: Supervision (verbal cues/safety issues);5: Chair or W/C > Bed: Supervision (verbal cues/safety  issues)  FIM - Locomotion: Wheelchair Distance: 150 Locomotion: Wheelchair: 6: Travels 150 ft or more, turns around, maneuvers to table, bed or toilet, negotiates  3% grade: maneuvers on rugs and over door sills independently FIM - Locomotion: Ambulation Locomotion: Ambulation Assistive Devices: Walker - Rolling Ambulation/Gait Assistance: 4: Min assist Locomotion: Ambulation: 1: Travels less than 50 ft with minimal assistance (Pt.>75%)  Comprehension Comprehension Mode: Auditory Comprehension: 7-Follows complex conversation/direction: With no assist  Expression Expression Mode: Verbal Expression: 7-Expresses complex ideas: With no assist  Social Interaction Social Interaction: 7-Interacts appropriately with others - No medications needed.  Problem Solving Problem Solving: 5-Solves complex 90% of the time/cues < 10% of the time  Memory Memory: 6-More than reasonable amt of time  Medical Problem List and Plan:  1. DVT Prophylaxis/Anticoagulation: Pharmaceutical: Lovenox  2. Pain Management: decrease morphine as possible 3. Mood:  . Ego support daily by team. Continue cymbalta daily.  4. Neuropsych: This patient is capable of making decisions on his/her own behalf.  5. DM type 2: continue AC/HS CBG checks. BS previously uncontrolled due to steroids on board. Continue Amaryl and canagliflozin. lantus--dc today. Can resume glucophage per home regimen.  6. MS flare:steroids completed  Continue rebiff 3 X week.  7. Bronchitis with likely PNA: completed levaquin.    -cough has generally improved--recommend using robitussin and IS at home 8. Leukocytosis: likely reactive due to PNA as well as steroid effect. No fevers noted and patient clinically looks better. Will monitor for symptoms of infection. CXR without disease 9. HTN: continue norvasc, avapro and atenolol daily. Monitor BP on bid basis.  10. Neurogenic bladder:   Regular toileting  -ditropan has helped urinary frequency  LOS  (Days) 12 A FACE TO FACE EVALUATION WAS PERFORMED  Tishara Pizano T 08/04/2012 7:49 AM

## 2012-08-05 ENCOUNTER — Inpatient Hospital Stay (HOSPITAL_COMMUNITY): Payer: Medicare Other | Admitting: Occupational Therapy

## 2012-08-05 ENCOUNTER — Inpatient Hospital Stay (HOSPITAL_COMMUNITY): Payer: Medicare Other | Admitting: Physical Therapy

## 2012-08-05 DIAGNOSIS — G35 Multiple sclerosis: Secondary | ICD-10-CM

## 2012-08-05 LAB — GLUCOSE, CAPILLARY
Glucose-Capillary: 87 mg/dL (ref 70–99)
Glucose-Capillary: 210 mg/dL — ABNORMAL HIGH (ref 70–99)
Glucose-Capillary: 175 mg/dL — ABNORMAL HIGH (ref 70–99)
Glucose-Capillary: 168 mg/dL — ABNORMAL HIGH (ref 70–99)

## 2012-08-05 LAB — CBC
HCT: 39.3 % (ref 36.0–46.0)
MCHC: 32.3 g/dL (ref 30.0–36.0)
Platelets: 252 10*3/uL (ref 150–400)
RDW: 15.4 % (ref 11.5–15.5)

## 2012-08-05 MED ORDER — PREDNISONE 20 MG PO TABS
20.0000 mg | ORAL_TABLET | Freq: Every day | ORAL | Status: DC
  Administered 2012-08-05 – 2012-08-07 (×3): 20 mg via ORAL
  Filled 2012-08-05 (×5): qty 1

## 2012-08-05 NOTE — Progress Notes (Signed)
Physical Therapy Session Note  Patient Details  Name: Laura Roberts MRN: [id number] Date of Birth: 03-05-1941  Today's Date: 08/05/2012 Time: Session #1:  [id number], Session #2:  Time Calculation (min): 52 min  Short Term Goals: Week 2:  PT Short Term Goal 1 (Week 2): STGs=LTGs  Skilled Therapeutic Interventions/Progress Updates:    Session #1: This session focused on WC mobility for arm strength and endurance training >150' with mod I.  Pt able to manage all parts independently.  Gait with rollator working on being able to walk until fatigue and then lock rollator down, turn and sit on the rollator like she would at home.  Min assist for gait due to with fatigue legs start to buckle.  Min assist to stabilize rollator during transitions.  Max gait distance at one time 20'.  Total distance traveled 75'.  Car transfer supervision into higher car from [facility].  Pt using upper extremity on handle inside of car.  Pt just reported today that she has a stoop after getting up the ramp to get into her home, so we practiced a 4" step with rollator mod assist as she doesn's think that her friends would be strong enough to bump her up that one step.  Min assist to support trunk while stepping up with left leg leading.  Left leg also had to lead down the step as right weaker leg was stuck under her.  Session #2:  This session focused on WC mobility for upper extremity strength and endurance training mod I 150' x 2.  Practiced the step to get into her home again and stair goal for min assist added to goals.  Mod assist still due to fatigue to step up with rollator onto 4" step.  Assist needed at trunk to support her over her weak legs and assist needed to help lift and lower rollator onto/off of step.  Bed mobility in ADL apartment supervision/cueing for using leg brace to help her pull her right leg into bed.  I asked pt because she relies heavily on her pants to maneuver her legs what she wears to sleep and she said  "a t-shirt".  No pants, so when we do her home eval tomorrow, we will need to practice bed mobility with the leg lifter and not pulling on her pants legs.  Supine to sit mod I with extra time needed to get legs over side of bed.  Transfers from [facility] to bed and back to [facility] using rollator close supervision.  Functional tasks in kitchen simulating home environment at a WC and seated level.  Pt was able to get things in and out of refrigerator independently, place them on the counter and then get to a kitchen stool.  Transfer to stool supervision.  Pt using kitchen sink for support during transfer.  "This is how I do it at home".  Pt returned to room in [facility] after session.  Questions answered about home eval scheduled for tomorrow.      Therapy Documentation Precautions:  Precautions Precautions: Fall Required Braces or Orthoses: Other Brace/Splint Other Brace/Splint: R solid AFO Restrictions Weight Bearing Restrictions: No General:   Vital Signs: Therapy Vitals Pulse Rate: 78  BP: 100/60 mmHg Pain: Pain Assessment Pain Assessment: No/denies pain Pain Score: 0-No pain  See FIM for current functional status  Therapy/Group: Individual Therapy  Laura Roberts, PT, DPT [phone]   08/05/2012, 12:03 PM

## 2012-08-05 NOTE — Patient Care Conference (Signed)
Inpatient RehabilitationTeam Conference and Plan of Care Update Date: 08/04/2012   Time: 2:20 PM    Patient Name: Laura Roberts      Medical Record Number: [id number]  Date of Birth: September 29, 1940 Sex: Female         Room/Bed: 4004/4004-01 Payor Info: Payor: MEDICARE  Plan: MEDICARE PART A AND B  Product Type: *No Product type*     Admitting Diagnosis: MS exacerbation  Admit Date/Time:  07/23/2012  4:14 PM Admission Comments: No comment available   Primary Diagnosis:  Multiple sclerosis exacerbation Principal Problem: Multiple sclerosis exacerbation  Patient Active Problem List   Diagnosis Date Noted  . SOB (shortness of breath) 07/21/2012  . Weakness 07/21/2012  . Bronchitis 07/21/2012  . Multiple sclerosis exacerbation 07/21/2012  . Cough 07/21/2012  . HTN (hypertension) 07/21/2012  . DM (diabetes mellitus) 07/21/2012  . HLD (hyperlipidemia) 07/21/2012  . Tachycardia 07/21/2012  . GERD (gastroesophageal reflux disease) 07/21/2012  . Multiple sclerosis     Expected Discharge Date: Expected Discharge Date: 08/07/12  Team Members Present: Physician leading conference: Dr. Faith Rogue Social Worker Present: Amada Jupiter, LCSW Nurse Present: Other (comment) Milly Jakob Rcom, RN) PT Present: Karolee Stamps, PT OT Present: Edwin Cap, Marye Round, OT Other (Discipline and Name): Oran Rein, RD;  Tora Duck, PPS Coordinator     Current Status/Progress Goal Weekly Team Focus  Medical   generally improving, low bp today, not feeling well  stabilize cv status to allow for continued, increased mobility  home safety eval, see above   Bowel/Bladder   Continent of bowel. LBM 08/03/12. Decreased episode of stress incontinence. Pt wears brief  Decreased incontinence episode  Continue with timed toileting q 2-3hrs   Swallow/Nutrition/ Hydration             ADL's   supervision -> mod I  overall mod I  ADL transfers, safety with transfers & w/c set-up, d/c planning, overall activity  tolerance/endurance, energy conservation prn   Mobility   supervision bed mobility and transfers into and out of WC, gait min assist short distances with rollator, WC mobility mod I indoor tiled surfaces  WC level goals mod I overall, supervision for car transfer, min assist gait (keeping in mind she is going home WC level she will only practice gait at home with HHPT or someone there to assist her)  home eval 08/04/12, WC mobility now for endurance training since goal met, transfers, bed mobility, gait with rollator vs regular RW to determine which will be safest.     Communication             Safety/Cognition/ Behavioral Observations            Pain   Scheduled Neurotin 300mg  qid  <3  Monitor for effectiveness   Skin   CDI  No skin breakdown  Continue to assess skin q shift. Routine turn q 2hrs    Rehab Goals Patient on target to meet rehab goals: Yes *See Interdisciplinary Assessment and Plan and progress notes for long and short-term goals  Barriers to Discharge: see prior    Possible Resolutions to Barriers:  home eval, see prior    Discharge Planning/Teaching Needs:  Home alone with intermittent assistance of friends.  Home eval moved to 08/05/12.      Team Discussion:  Continues to make good gains with therapy overall, however, not feeling well today - BP issues? Had planned to complete home eval today but will hold until tomorrow.  MD to follow  up with this with labs and UA.  Team does feel pt was on track to meet mod i goals with plan to go home with only intermittent support of friends.  SW to follow up with pt and sons.  Revisions to Treatment Plan:  Moving home eval to tomorrow due to medical issues.  No changes otherwise.   Continued Need for Acute Rehabilitation Level of Care: The patient requires daily medical management by a physician with specialized training in physical medicine and rehabilitation for the following conditions: Daily direction of a multidisciplinary  physical rehabilitation program to ensure safe treatment while eliciting the highest outcome that is of practical value to the patient.: Yes Daily medical management of patient stability for increased activity during participation in an intensive rehabilitation regime.: Yes Daily analysis of laboratory values and/or radiology reports with any subsequent need for medication adjustment of medical intervention for : Other;Neurological problems;Post surgical problems (pain)  Katrice Goel 08/05/2012, 10:10 AM

## 2012-08-05 NOTE — Progress Notes (Signed)
Patient ID: Laura Roberts, female   DOB: 1941/06/05, 72 y.o.   MRN: [id number] Subjective/Complaints: Feels much better today A 12 point review of systems has been performed and if not noted above is otherwise negative.   Objective: Vital Signs: Blood pressure 110/63, pulse 74, temperature 97.9 F (36.6 C), temperature source Oral, resp. rate 19, height 5\' 2"  (1.575 m), weight 91 kg (200 lb 9.9 oz), SpO2 96.00%. No results found.  Basename 08/04/12 1558  WBC 13.7*  HGB 13.2  HCT 40.1  PLT 232    Basename 08/04/12 1558  NA 136  K 4.7  CL 101  GLUCOSE 239*  BUN 17  CREATININE 0.91  CALCIUM 9.4   CBG (last 3)   Basename 08/05/12 0712 08/04/12 2104 08/04/12 1642  GLUCAP 168* 125* 185*    Wt Readings from Last 3 Encounters:  07/23/12 91 kg (200 lb 9.9 oz)  07/22/12 91.2 kg (201 lb 1 oz)    Physical Exam:  Constitutional: She is oriented to person, place, and time. She appears well-developed and well-nourished.  HENT:  Head: Normocephalic.  Left Ear: External ear normal.  Eyes: Pupils are equal, round, and reactive to light.  Neck: Normal range of motion.  Cardiovascular: Normal rate and regular rhythm.  Pulmonary/Chest: She is CTA bilaterally. No distress Neurological: She is alert and oriented to person, place, and time.  RLE with impaired proprioception At the toes. Decreased sensation to LT below the knees. DTR's 2++ Skin: Skin is warm and dry.  Motor strength is 4/5 in bilateral deltoid, biceps, triceps, grip  2 minus in the left hip flexors 3   knee extensors, right HF 1+, KF 2 to 2+;  1/5 in the right ankle dorsiflexor plantar flexor and 2+ to 3-/5 in the left ankle dorsiflexor plantar flexor- no change. Right AFO fiting appropriately   Assessment/Plan: 1. Functional deficits secondary to multiple sclerosis with neurogenic bowel and bladder and thoracic myelopathy which require 3+ hours per day of interdisciplinary therapy in a comprehensive inpatient rehab  setting. Physiatrist is providing close team supervision and 24 hour management of active medical problems listed below. Physiatrist and rehab team continue to assess barriers to discharge/monitor patient progress toward functional and medical goals.  Home eval today or tomorrow  FIM: FIM - Bathing Bathing Steps Patient Completed: Chest;Left Arm;Right Arm;Abdomen;Front perineal area;Buttocks;Right upper leg;Left upper leg;Right lower leg (including foot);Left lower leg (including foot) Bathing: 6: Assistive device (Comment)  FIM - Upper Body Dressing/Undressing Upper body dressing/undressing steps patient completed: Thread/unthread left sleeve of pullover shirt/dress;Thread/unthread right sleeve of pullover shirt/dresss;Put head through opening of pull over shirt/dress;Pull shirt over trunk Upper body dressing/undressing: 7: Complete Independence: No helper FIM - Lower Body Dressing/Undressing Lower body dressing/undressing steps patient completed: Thread/unthread right pants leg;Thread/unthread left pants leg;Pull pants up/down;Don/Doff right sock;Don/Doff left sock Lower body dressing/undressing: 3: Mod-Patient completed 50-74% of tasks (secondary to time)  FIM - Toileting Toileting steps completed by patient: Adjust clothing prior to toileting;Performs perineal hygiene;Adjust clothing after toileting Toileting Assistive Devices: Grab bar or rail for support Toileting: 6: Assistive device: No helper  FIM - Diplomatic Services operational officer Devices: Elevated toilet seat;Grab bars Toilet Transfers: 4-From toilet/BSC: Min A (steadying Pt. > 75%)  FIM - Bed/Chair Transfer Bed/Chair Transfer Assistive Devices: Arm rests Bed/Chair Transfer: 5: Sit > Supine: Supervision (verbal cues/safety issues);5: Supine > Sit: Supervision (verbal cues/safety issues);5: Bed > Chair or W/C: Supervision (verbal cues/safety issues);5: Chair or W/C > Bed: Supervision (verbal cues/safety  issues)  FIM - Locomotion: Wheelchair Distance: 15 Locomotion: Wheelchair: 1: Travels less than 50 ft with supervision, cueing or coaxing FIM - Locomotion: Ambulation Locomotion: Ambulation Assistive Devices: Designer, industrial/product Ambulation/Gait Assistance: 4: Min assist Locomotion: Ambulation: 0: Activity did not occur  Comprehension Comprehension Mode: Auditory Comprehension: 7-Follows complex conversation/direction: With no assist  Expression Expression Mode: Verbal Expression: 7-Expresses complex ideas: With no assist  Social Interaction Social Interaction: 7-Interacts appropriately with others - No medications needed.  Problem Solving Problem Solving: 5-Solves complex 90% of the time/cues < 10% of the time  Memory Memory: 6-More than reasonable amt of time  Medical Problem List and Plan:  1. DVT Prophylaxis/Anticoagulation: Pharmaceutical: Lovenox  2. Pain Management: decrease morphine as possible 3. Mood:  . Ego support daily by team. Continue cymbalta daily.  4. Neuropsych: This patient is capable of making decisions on his/her own behalf.  5. DM type 2: continue AC/HS CBG checks. BS previously uncontrolled due to steroids on board. Continue Amaryl and canagliflozin. lantus--dc today. Can resume glucophage per home regimen.  6. MS flare:steroids completed  Continue rebiff 3 X week.  7. Bronchitis with likely PNA: completed levaquin.    -cough has generally improved--recommend using robitussin and IS at home 8. Hypotension, increased lethargy    -largely improved today  -cbg's were low. Labs ok  -i resumed steroids. Continue prednisone at 20mg  qday with SLOW taper  -ua equivocal, culture pending 9. HTN: continue norvasc, avapro and atenolol daily. Monitor BP on bid basis.  10. Neurogenic bladder:   Regular toileting  -ditropan has helped urinary frequency  LOS (Days) 13 A FACE TO FACE EVALUATION WAS PERFORMED  [place]acl[place] Acklin T 08/05/2012 7:33 AM

## 2012-08-05 NOTE — Progress Notes (Signed)
Social Work Patient ID: Laura Roberts, female   DOB: 1941-04-07, 72 y.o.   MRN: [id number]  Have reviewed team conference with patient and her son, [name].  Both aware we continue to plan for 2/7 d/c at modified independent goals overall.  Unfortunately, the home evaluation has had to be moved to tomorrow which son is very concerned about.  His primary concern being that pt may not be able to navigate her home completely at w/c level.  Plan to follow up with both sons and wife pt after eval completed tomorrow.  Reviewed with son the other services that can be arranged at home for pt as well as possible referral to [employer] (community case management) and mobile meals (son inquiring).  Will continue to follow to assess with d/c.  Kiwan Gadsden, LCSW

## 2012-08-05 NOTE — Progress Notes (Signed)
Occupational Therapy Session Notes  Patient Details  Name: Laura Roberts MRN: [id number] Date of Birth: [birth year]  Today's Date: 08/05/2012  Short Term Goals: Week 1:  OT Short Term Goal 1 (Week 1): Patient will complete tub/shower transfer with minimal assistance OT Short Term Goal 1 - Progress (Week 1): Met OT Short Term Goal 2 (Week 1): Patient will complete UB/LB bathing with supervision using AE prn OT Short Term Goal 2 - Progress (Week 1): Met OT Short Term Goal 3 (Week 1): Patient will complete LB dressing with min assist using AE prn OT Short Term Goal 3 - Progress (Week 1): Met OT Short Term Goal 4 (Week 1): Patient will complete toilet transfer with supervision using DME prn OT Short Term Goal 4 - Progress (Week 1): Met  Week 2:  OT Short Term Goal 1 (Week 2): Short Term Goals = Long Term Goals  Skilled Therapeutic Interventions/Progress Updates:   Session #1 [phone] - 48 Minutes Individual Therapy No complaints of pain Patient found supine in bed eating breakfast. Patient stated she was feeling better this am. Patient transferred out of bed at mod I level into w/c and gathered necessary items for ADL. Patient then propelled self from room -> ADL apartment for toilet transfer, toileting, then tub/shower transfer onto tub transfer bench. Patient then performed ADL retraining at shower level. Focused skilled intervention on w/c propulsion, w/c management, w/c set-up prior to transfers, ADL transfers, UB/LB bathing & dressing, sit/stands, dynamic standing balance/tolerance/endurance, overall activity tolerance/endurance, and energy conservation techniques prn. Patient propelled self back to room independently.   Session #2 [id number] - 30 Minutes Individual Therapy No complaints of pain Patient found seated in w/c upon entering the room. Patient propelled self from room -> 1st floor of hospital for therapeutic activity focusing on w/c maneuvering throughout gift shop. Also,  discussed community re-entry with patient regarding elevators, restaurants, bathroom set-up, etc. Patient propelled self back to room and therapist left her seated in w/c with call bell & phone within reach.   Precautions:  Precautions Precautions: Fall Required Braces or Orthoses: Other Brace/Splint Other Brace/Splint: R solid AFO Restrictions Weight Bearing Restrictions: No  See FIM for current functional status  [name],[name] 08/05/2012, 7:37 AM

## 2012-08-06 ENCOUNTER — Inpatient Hospital Stay (HOSPITAL_COMMUNITY): Payer: Medicare Other | Admitting: Occupational Therapy

## 2012-08-06 ENCOUNTER — Inpatient Hospital Stay (HOSPITAL_COMMUNITY): Payer: Medicare Other | Admitting: Physical Therapy

## 2012-08-06 LAB — URINE CULTURE: Colony Count: >=100000

## 2012-08-06 LAB — GLUCOSE, CAPILLARY
Glucose-Capillary: 103 mg/dL — ABNORMAL HIGH (ref 70–99)
Glucose-Capillary: 288 mg/dL — ABNORMAL HIGH (ref 70–99)

## 2012-08-06 NOTE — Progress Notes (Signed)
Physical Therapy Discharge Summary  Patient Details  Name: Laura Roberts MRN: [id number] Date of Birth: 1941/06/03  Today's Date: 08/06/2012 Time:Session #1: [id number], Session #2:  [id number] Time Calculation (min): Session #1: 40 min, Session #2: 127 min  Patient has met 9 of 9 long term goals due to improved activity tolerance, improved balance, increased strength, decreased pain, ability to compensate for deficits, functional use of  right lower extremity and left lower extremity and improved coordination.  Patient to discharge at a wheelchair level Modified Independent.   Patient's care partner is independent to provide the necessary physical assistance at discharge.  Reasons goals not met: NA  Recommendation:  Patient will benefit from ongoing skilled PT services in home health setting to continue to advance safe functional mobility, address ongoing impairments in bil leg strength, balance, mobility, gait, and minimize fall risk.  Equipment: None provided.  See comments below re: home evaluation recommendations.    Reasons for discharge: treatment goals met and discharge from hospital  Patient/family agrees with progress made and goals achieved: Yes  PT Discharge Precautions/Restrictions Precautions Precautions: Fall Required Braces or Orthoses: Other Brace/Splint Other Brace/Splint: AFO-> RLE   Pain  no reports today     Cognition Overall Cognitive Status: Appears within functional limits for tasks assessed Arousal/Alertness: Awake/alert Orientation Level: Oriented X4 Memory: Appears intact Awareness: Appears intact Problem Solving: Appears intact Safety/Judgment: Appears intact Sensation Sensation Light Touch Impaired Details: Impaired RLE;Impaired LLE Additional Comments: decreased sensation bil legs R>L Mobility Bed Mobility Supine to Sit: 6: Modified independent (Device/Increase time);HOB flat Sitting - Scoot to Edge of Bed: 6: Modified independent  (Device/Increase time) Sit to Supine: 6: Modified independent (Device/Increase time);HOB flat Transfers Sit to Stand: 6: Modified independent (Device/Increase time);With upper extremity assist;From bed;From chair/3-in-1;From toilet;With armrests Stand to Sit: 6: Modified independent (Device/Increase time);With upper extremity assist;With armrests;To elevated surface;To bed;To chair/3-in-1;To toilet Locomotion  Ambulation Ambulation: Yes Ambulation/Gait Assistance: 4: Min guard Ambulation Distance (Feet): 50 Feet Assistive device: 4-wheeled walker Ambulation/Gait Assistance Details: min assist as pt fatigued for stability over buckling knees.  Has right AFO donned, but still difficulty in her home progressing feet over carpets and rugs.   Stairs / Additional Locomotion Stairs: Yes Stairs Assistance: 4: Min assist Stair Management Technique: Forwards;With walker Number of Stairs: 1  Height of Stairs: 7  Wheelchair Mobility Wheelchair Mobility: Yes Wheelchair Assistance: 6: Modified independent (Device/Increase time) Occupational hygienist: Both upper extremities Wheelchair Parts Management: Independent Distance: 150     Games developer Sitting - Balance Support: Right upper extremity supported;Left upper extremity supported;Feet supported Static Sitting - Level of Assistance: 6: Modified independent (Device/Increase time) Static Standing Balance Static Standing - Balance Support: Right upper extremity supported;Left upper extremity supported;Bilateral upper extremity supported;During functional activity Static Standing - Level of Assistance: 6: Modified independent (Device/Increase time) Dynamic Standing Balance Dynamic Standing - Balance Support: Right upper extremity supported;Left upper extremity supported;Bilateral upper extremity supported;During functional activity Dynamic Standing - Level of Assistance: 6: Modified independent (Device/Increase  time) Dynamic Standing - Balance Activities: Reaching for objects;Reaching across midline;Forward lean/weight shifting;Lateral lean/weight shifting;Other (comment) (pulling up pants from floor after toileting) Extremity Assessment      RLE Strength RLE Overall Strength Comments:   h/o R ankle fx and R TKA,  ankle DF 2/5, PF2/5, knee ext 3+/5, knee flex 3-/5, hip flexion 2/5 LLE Strength LLE Overall Strength Comments: h/o L TKA, hip flexion 3/5, , ankle PF 4/5, DF 4/5, knee ext 4/5, knee flexion 3+/5  See FIM for current functional status Skilled Interventions:  Session #1: This session focused on transfer training, dynamic sitting balance assessment.  WC to bed mod I with use of armrests.  Pt able to manage WC parts independently.  Dynamic sitting balance: mod I reaching greater than 6" outside of BOS in all directions including down to the tips of her shoes.  Discussed at length the process of the home eval and goals of the evaluation and what she and I would like to accomplish at her home during our next AM session.   Session #2: co-session with RT.  Home Evaluation: Pt lives in a one story home with small, but steep ramp to enter requiring total assist to push up the ramp to her doorway.  ~7" stoop to get inside of her house managed with min assist and rollator from [facility] at top of the ramp.  Pt's two friends Laura Bible and Laura Roberts were present for education and to assist with getting into the house.  Gait with rollator over carpeted surfaces min assist due to right foot even with new solid AFO brace and toe slick tends to drag on the carpeted flooring providing more resistance.  Using her own rollator to walk to her WC ~25' from entryway.  Next we assessed her ability to function from a WC level in her home starting with the bathroom and master bedroom.  We had to move a bookshelf out of the main hallway to make that hallway accessible.  We got out her tub transfer bench from the storage room and traded it  out for the shower chair that was currently in her bathroom.  We attempted to place the 3-in-1 BSC in the bathroom, but it interfered with her access to her shower.  Instead, the recommendation is being made for her to have another grab bar placed directly across from her toilet mid way up the wall under the molding.  She also like to place her hand on the over the toilet vanity which is not secured to the wall placing it at risk for falling over on her if she uses it for support.  I recommend that this be anchored with a furniture/bookshelf anchor to the wall.  Pt performed both toilet and shower transfer from [facility] pulled all the way into the bathroom modI.  She used the blue leg lifter to get both of her feet over into the shower.  Next, we accessed the maste rbedroom which required moving a bench at the end of her bed for her to access the left side of her bed, and even then, she could only get the [facility] to the very far corner of the foot of the bed due to other pieces of heavy furniture impeding her access.  She was able to take 4-6 steps down the length of the bed using the bed itself and the dresser to her right for support to get into the bed.  Sit <-> supine mod I using the blue leg lifter to get her legs into the bed.  Pt stated that her son, Jerolyn Shin could come and take the chair out of the far side of the room, move the bed over and that would make space for the [facility] to fit all the way beside of the bed.  It took her quite a bit of time to maneuver the WC in the tight spaces of her bedroom, but she was able to do so mod I. I educated her on removing the leg  rests for tight spaces, especially when turning and that could free up and inch or two of turing radius.  Next we went from her bedroom to her living room through the kitchen to the chair where she sits the most.  She performed a 180 degree transfer using the armrests of the WC and the armrests of the recliner chair for support mod I .  She was able to fit  through all of her doorways except the hallway doorway to her sitting room with her recliner chair due to a large wooden piece of furniture obstructing her path-instead she accesses this room via the kitchen doorway, which uses more effort to go all the way around.  Another recommendation I made is that the plastic folding doors be removed from the doorways to increase access to rooms in her wheelchair.  I assess her laundry situation-it is out the back door with a 6-7" stoop and then a short walk to a utility closet with a door.  She will be unable to access her laundry and her friends volunteered to help with this task.  I spoke with the pt's son, [name] over the phone about different options as far as relocating the laundry and we never settled on the best fix for this and will be an ongoing investigation by the family as to what to do about her laundry access.  Other recommendations made to family is to bring the front ramp up to code (handout provided) so that either the pt or her friends would have an easier time accessing the outside of the house via [facility].  Right now it is quite a heavy push to get her in and out of her front patio via this ramp due to the steep grade.  Her friends are retirement age and have struggled with getting her up and down this ramp in the past.  The pt when getting into and out of the house also has two front doorway mats.  Reducing this to one will help decrease her fall risk as her feet get stuck on the edge of the mats.  All recommendations have been made know to the pt, her friends, and her son, [name] (via phone contact).     Rollene Rotunda Dewon Mendizabal, PT, DPT [phone]   08/06/2012, 5:25 PM

## 2012-08-06 NOTE — Progress Notes (Signed)
Patient evaluated for long-term disease management services with [facility] Care Management Program. Patient will receive a post discharge transition of care call and evaluated for monthly home visits for assessments and for education. Reports she lives alone but will have 2 friends that will stay with her post discharge. Also reports she has supportive sons as well. Of note will have [facility] services at discharge. Explained how [facility] Care Management will not replace or interfere with services provided by home health. Consents signed at bedside. Left [facility] Care Management packet and contact information. Appreciative of visit.  Raiford Noble, MSN- Ed, RN,BSN [address] [facility] Liaison [phone]

## 2012-08-06 NOTE — Progress Notes (Signed)
Occupational Therapy Session Notes & Discharge Summary  Patient Details  Name: Laura Roberts MRN: [id number] Date of Birth: Aug 06, 1940  Today's Date: 08/06/2012  SESSION NOTES  Session #1 [phone] - 60 Minutes Individual Therapy No complaints of pain Patient found supine in bed. Patient engaged in bed mobility and transferred edge of bed -> w/c at modified independent level. Patient then gathered all necessary items for her ADL at w/c mod I level(simple housekeeping task). Afterwards, patient propelled self from room -> ADL apartment for ADL at shower level. Patient performing ADL and ADL transfers at a modified independent w/c level at this time. Patient performing tasks in a safe manner and uses energy conservation techniques prn. At end of session patient propelled self back to room, call bell & phone left within reach.   Session #2 1430-1500 - 30 Minutes Individual Therapy No complaints of pain Patient found seated in w/c upon entering room. Patient propelled self from room -> therapy gym for education on UE exercises to perform at home. Dicussed weighted dumbbells and w/c pushups for home use. Also discussed sleeping positions and possibly using a therapy wedge to decrease back pain. Patient propelled self back to room independently.   -----------------------------------------------------------------------------------------------------------------  DISCHARGE SUMMARY Patient has met 12 of 12 long term goals due to improved activity tolerance, improved balance, postural control, ability to compensate for deficits, improved attention, improved awareness and improved coordination.  Patient to discharge at overall Modified Independent w/c level.    Reasons goals not met: n/a, all goals met at this time.  Recommendation:  Patient will benefit from ongoing skilled OT services in home health setting to continue to advance functional skills in the area of BADL and iADL.Recommending  additional HHOT to focus on dynamic standing balance/tolerance/endurance and overall activities in standing positions as well as patient's overall strength and endurance.   Equipment: BSC  Reasons for discharge: treatment goals met and discharge from hospital  Patient/family agrees with progress made and goals achieved: Yes  Precautions/Restrictions  Precautions Precautions: Fall Required Braces or Orthoses: Other Brace/Splint Other Brace/Splint: AFO-> RLE Restrictions Weight Bearing Restrictions: No  Vital Signs Therapy Vitals Temp: 97.9 F (36.6 C) Temp src: Oral Pulse Rate: 71  Resp: 19  BP: 108/73 mmHg Patient Position, if appropriate: Lying Oxygen Therapy SpO2: 99 % O2 Device: None (Room air)  Pain Pain Assessment Pain Assessment: No/denies pain Pain Score: 0-No pain  ADL - See FIM  Vision/Perception  Vision - History Baseline Vision: Bifocals Visual History: Cataracts Patient Visual Report: No change from baseline Vision - Assessment Vision Assessment: Vision not tested Perception Perception: Within Functional Limits Praxis Praxis: Intact   Cognition Overall Cognitive Status: Appears within functional limits for tasks assessed Arousal/Alertness: Awake/alert Orientation Level: Oriented X4 Memory: Appears intact Awareness: Appears intact Problem Solving: Appears intact Safety/Judgment: Appears intact  Sensation Sensation Light Touch: Impaired by gross assessment Light Touch Impaired Details: Impaired RUE;Impaired LUE Stereognosis: Impaired by gross assessment Hot/Cold: Impaired by gross assessment Proprioception: Impaired by gross assessment Additional Comments: Patient able to compensate with decreased sensation throughout bilateral hands Coordination Gross Motor Movements are Fluid and Coordinated: Yes Fine Motor Movements are Fluid and Coordinated: Yes  Motor - See Discharge Naviagtor  Mobility - See Discharge Naviagtor   Trunk/Postural  Assessment - See Discharge Naviagtor   Balance- See Discharge Naviagtor   Extremity/Trunk Assessment RUE Assessment RUE Assessment: Within Functional Limits LUE Assessment LUE Assessment: Within Functional Limits  See FIM for current functional status  Laura Roberts 08/06/2012,  7:52 AM

## 2012-08-06 NOTE — Progress Notes (Signed)
Patient ID: Laura Roberts, female   DOB: 20-Feb-1941, 72 y.o.   MRN: [id number] Subjective/Complaints: Feels well except for cough A 12 point review of systems has been performed and if not noted above is otherwise negative.   Objective: Vital Signs: Blood pressure 108/73, pulse 71, temperature 97.9 F (36.6 C), temperature source Oral, resp. rate 19, height 5\' 2"  (1.575 m), weight 89.223 kg (196 lb 11.2 oz), SpO2 99.00%. No results found.  Basename 08/05/12 0640 08/04/12 1558  WBC 12.6* 13.7*  HGB 12.7 13.2  HCT 39.3 40.1  PLT 252 232    Basename 08/04/12 1558  NA 136  K 4.7  CL 101  GLUCOSE 239*  BUN 17  CREATININE 0.91  CALCIUM 9.4   CBG (last 3)   Basename 08/06/12 0717 08/05/12 2203 08/05/12 1622  GLUCAP 103* 176* 229*    Wt Readings from Last 3 Encounters:  08/05/12 89.223 kg (196 lb 11.2 oz)  07/22/12 91.2 kg (201 lb 1 oz)    Physical Exam:  Constitutional: She is oriented to person, place, and time. She appears well-developed and well-nourished.  HENT:  Head: Normocephalic.  Left Ear: External ear normal.  Eyes: Pupils are equal, round, and reactive to light.  Neck: Normal range of motion.  Cardiovascular: Normal rate and regular rhythm.  Pulmonary/Chest: She is CTA bilaterally. No distress Neurological: She is alert and oriented to person, place, and time.  RLE with impaired proprioception At the toes. Decreased sensation to LT below the knees. DTR's 2++ Skin: Skin is warm and dry.  Motor strength is 4/5 in bilateral deltoid, biceps, triceps, grip  2 minus in the left hip flexors 3   knee extensors, right HF 1+, KF 2 to 2+;  1/5 in the right ankle dorsiflexor plantar flexor and 2+ to 3-/5 in the left ankle dorsiflexor plantar flexor- no change. Right AFO fiting appropriately   Assessment/Plan: 1. Functional deficits secondary to multiple sclerosis with neurogenic bowel and bladder and thoracic myelopathy which require 3+ hours per day of  interdisciplinary therapy in a comprehensive inpatient rehab setting. Physiatrist is providing close team supervision and 24 hour management of active medical problems listed below. Physiatrist and rehab team continue to assess barriers to discharge/monitor patient progress toward functional and medical goals.  Home eval today   FIM: FIM - Bathing Bathing Steps Patient Completed: Chest;Right Arm;Left Arm;Abdomen;Front perineal area;Buttocks;Right upper leg;Right lower leg (including foot);Left lower leg (including foot);Left upper leg Bathing: 6: Assistive device (Comment)  FIM - Upper Body Dressing/Undressing Upper body dressing/undressing steps patient completed: Thread/unthread right bra strap;Thread/unthread left bra strap;Hook/unhook bra;Thread/unthread left sleeve of pullover shirt/dress;Put head through opening of pull over shirt/dress;Pull shirt over trunk;Thread/unthread right sleeve of pullover shirt/dresss Upper body dressing/undressing: 7: Complete Independence: No helper FIM - Lower Body Dressing/Undressing Lower body dressing/undressing steps patient completed: Thread/unthread right underwear leg;Thread/unthread left underwear leg;Pull underwear up/down;Thread/unthread right pants leg;Thread/unthread left pants leg;Pull pants up/down;Don/Doff right sock;Don/Doff left sock;Don/Doff right shoe;Don/Doff left shoe;Fasten/unfasten right shoe;Fasten/unfasten left shoe Lower body dressing/undressing: 6: Assistive device (Comment)  FIM - Toileting Toileting steps completed by patient: Adjust clothing prior to toileting;Performs perineal hygiene;Adjust clothing after toileting Toileting Assistive Devices: Grab bar or rail for support Toileting: 6: Assistive device: No helper  FIM - Diplomatic Services operational officer Devices: Elevated toilet seat;Grab bars;Bedside commode Toilet Transfers: 6-Assistive device: No helper  FIM - Bed/Chair Transfer Bed/Chair Transfer Assistive  Devices: Arm rests;Orthosis Bed/Chair Transfer: 5: Bed > Chair or W/C: Supervision (verbal cues/safety issues);5: Chair  or W/C > Bed: Supervision (verbal cues/safety issues)  FIM - Locomotion: Wheelchair Distance: 150 Locomotion: Wheelchair: 6: Travels 150 ft or more, turns around, maneuvers to table, bed or toilet, negotiates 3% grade: maneuvers on rugs and over door sills independently FIM - Locomotion: Ambulation Locomotion: Ambulation Assistive Devices: Walker - Rolling;Other (comment) (rollator) Ambulation/Gait Assistance: 4: Min assist Locomotion: Ambulation: 1: Travels less than 50 ft with minimal assistance (Pt.>75%)  Comprehension Comprehension Mode: Auditory Comprehension: 6-Follows complex conversation/direction: With extra time/assistive device  Expression Expression Mode: Verbal Expression: 6-Expresses complex ideas: With extra time/assistive device  Social Interaction Social Interaction: 7-Interacts appropriately with others - No medications needed.  Problem Solving Problem Solving: 6-Solves complex problems: With extra time  Memory Memory: 6-More than reasonable amt of time  Medical Problem List and Plan:  1. DVT Prophylaxis/Anticoagulation: Pharmaceutical: Lovenox  2. Pain Management: decrease morphine as possible 3. Mood:  . Ego support daily by team. Continue cymbalta daily.  4. Neuropsych: This patient is capable of making decisions on his/her own behalf.  5. DM type 2: continue AC/HS CBG checks. BS previously uncontrolled due to steroids on board. Continue Amaryl and canagliflozin. lantus--dc'ed. Resume glucophage.  6. MS flare:steroids completed  Continue rebiff 3 X week.  7. Bronchitis with likely PNA: completed levaquin.    -cough on occasion- mucinex, IS 8. Hypotension,lethargy    -better- held norvasc  -cbg's were low. Labs ok  -i resumed steroids. Continue prednisone at 20mg  qday with SLOW taper  -ua equivocal, culture likely contaminated 9. HTN:  continue norvasc, avapro and atenolol daily. Monitor BP on bid basis.  10. Neurogenic bladder:   Regular toileting  -ditropan has helped urinary frequency  LOS (Days) 14 A FACE TO FACE EVALUATION WAS PERFORMED  Rachell Druckenmiller T 08/06/2012 8:24 AM

## 2012-08-07 DIAGNOSIS — G35 Multiple sclerosis: Secondary | ICD-10-CM

## 2012-08-07 DIAGNOSIS — G35D Multiple sclerosis, unspecified: Secondary | ICD-10-CM

## 2012-08-07 LAB — GLUCOSE, CAPILLARY: Glucose-Capillary: 78 mg/dL (ref 70–99)

## 2012-08-07 MED ORDER — BENZONATATE 100 MG PO CAPS: 100.0000 mg | ORAL_CAPSULE | Freq: Three times a day (TID) | ORAL | Status: DC | PRN

## 2012-08-07 MED ORDER — METFORMIN HCL ER 500 MG PO TB24: 1000.0000 mg | ORAL_TABLET | Freq: Every day | ORAL | Status: DC

## 2012-08-07 MED ORDER — PREDNISONE 20 MG PO TABS: ORAL_TABLET | ORAL | Status: DC

## 2012-08-07 MED ORDER — OXYBUTYNIN CHLORIDE 5 MG PO TABS: 5.0000 mg | ORAL_TABLET | Freq: Three times a day (TID) | ORAL | Status: DC

## 2012-08-07 MED ORDER — METFORMIN HCL ER 500 MG PO TB24
1000.0000 mg | ORAL_TABLET | Freq: Every day | ORAL | Status: DC
  Administered 2012-08-07: 1000 mg via ORAL
  Filled 2012-08-07 (×2): qty 2

## 2012-08-07 NOTE — Progress Notes (Signed)
Social Work  Discharge Note  The overall goal for the admission was met for:   Discharge location: Yes - home alone at modified independent w/c level with intermittent assistance from friends  Length of Stay: Yes - 15 days  Discharge activity level: Yes  Home/community participation: Yes  Services provided included: MD, RD, PT, OT, RN, TR, Pharmacy and SW  Financial Services: Medicare and Private Insurance: [employer]  Follow-up services arranged: Home Health: PT, OT via [facility], DME: NA (has all needed therapy), Other: Referred to [employer] and [facility] for ongoing communtiy case management and Patient/Family has no preference for HH/DME agencies  Comments (or additional information):  Have also sent, as requested by son and ok'd by pt, a copy of PT's home evaluation.  Patient/Family verbalized understanding of follow-up arrangements: Yes  Individual responsible for coordination of the follow-up plan: patient  Confirmed correct DME delivered: NA      [name]

## 2012-08-07 NOTE — Progress Notes (Signed)
Late entry: Pt discharged home with family friend. Discharge instructions provided by Marissa Nestle, PA. All questions answered. Home medication returned to pt. Pt escorted off unit with personal belonging in w/c by Jasmine December, NT.

## 2012-08-07 NOTE — Progress Notes (Signed)
Patient ID: Laura Roberts, female   DOB: [birth year], 72 y.o.   MRN: [id number] Subjective/Complaints: Home eval went well. No complaints. Independent in the room! A 12 point review of systems has been performed and if not noted above is otherwise negative.   Objective: Vital Signs: Blood pressure 117/73, pulse 70, temperature 97.6 F (36.4 C), temperature source Oral, resp. rate 18, height 5\' 2"  (1.575 m), weight 89.223 kg (196 lb 11.2 oz), SpO2 100.00%. No results found.  Basename 08/05/12 0640 08/04/12 1558  WBC 12.6* 13.7*  HGB 12.7 13.2  HCT 39.3 40.1  PLT 252 232    Basename 08/04/12 1558  NA 136  K 4.7  CL 101  GLUCOSE 239*  BUN 17  CREATININE 0.91  CALCIUM 9.4   CBG (last 3)   Basename 08/06/12 2129 08/06/12 1653 08/06/12 1241  GLUCAP 143* 288* 99    Wt Readings from Last 3 Encounters:  08/05/12 89.223 kg (196 lb 11.2 oz)  07/22/12 91.2 kg (201 lb 1 oz)    Physical Exam:  Constitutional: She is oriented to person, place, and time. She appears well-developed and well-nourished.  HENT:  Head: Normocephalic.  Left Ear: External ear normal.  Eyes: Pupils are equal, round, and reactive to light.  Neck: Normal range of motion.  Cardiovascular: Normal rate and regular rhythm.  Pulmonary/Chest: She is CTA bilaterally. No distress Neurological: She is alert and oriented to person, place, and time.  RLE with impaired proprioception At the toes. Decreased sensation to LT below the knees. DTR's 2++ Skin: Skin is warm and dry.  Motor strength is 4/5 in bilateral deltoid, biceps, triceps, grip  2 minus in the left hip flexors 3   knee extensors, right HF 1+, KF 2 to 2+;  1/5 in the right ankle dorsiflexor plantar flexor and 2+ to 3-/5 in the left ankle dorsiflexor plantar flexor- no change. Right AFO fiting appropriately   Assessment/Plan: 1. Functional deficits secondary to multiple sclerosis with neurogenic bowel and bladder and thoracic myelopathy which require 3+  hours per day of interdisciplinary therapy in a comprehensive inpatient rehab setting. Physiatrist is providing close team supervision and 24 hour management of active medical problems listed below. Physiatrist and rehab team continue to assess barriers to discharge/monitor patient progress toward functional and medical goals.  Home eval today   FIM: FIM - Bathing Bathing Steps Patient Completed: Chest;Right Arm;Left Arm;Abdomen;Front perineal area;Buttocks;Right upper leg;Right lower leg (including foot);Left lower leg (including foot);Left upper leg Bathing: 6: Assistive device (Comment)  FIM - Upper Body Dressing/Undressing Upper body dressing/undressing steps patient completed: Thread/unthread right bra strap;Thread/unthread left bra strap;Hook/unhook bra;Thread/unthread left sleeve of pullover shirt/dress;Put head through opening of pull over shirt/dress;Pull shirt over trunk;Thread/unthread right sleeve of pullover shirt/dresss Upper body dressing/undressing: 7: Complete Independence: No helper FIM - Lower Body Dressing/Undressing Lower body dressing/undressing steps patient completed: Thread/unthread right underwear leg;Thread/unthread left underwear leg;Pull underwear up/down;Thread/unthread right pants leg;Thread/unthread left pants leg;Pull pants up/down;Don/Doff right sock;Don/Doff left sock;Don/Doff right shoe;Don/Doff left shoe;Fasten/unfasten right shoe;Fasten/unfasten left shoe Lower body dressing/undressing: 6: Assistive device (Comment)  FIM - Toileting Toileting steps completed by patient: Adjust clothing prior to toileting;Performs perineal hygiene;Adjust clothing after toileting Toileting Assistive Devices: Grab bar or rail for support Toileting: 6: Assistive device: No helper  FIM - Diplomatic Services operational officer Devices: Elevated toilet seat;Grab bars;Bedside commode Toilet Transfers: 6-Assistive device: No helper  FIM - Bed/Chair Transfer Bed/Chair  Transfer Assistive Devices: Arm rests;Orthosis Bed/Chair Transfer: 6: Supine > Sit: No assist;6:  Sit > Supine: No assist;6: Bed > Chair or W/C: No assist;6: Chair or W/C > Bed: No assist  FIM - Locomotion: Wheelchair Distance: 150 Locomotion: Wheelchair: 6: Travels 150 ft or more, turns around, maneuvers to table, bed or toilet, negotiates 3% grade: maneuvers on rugs and over door sills independently FIM - Locomotion: Ambulation Locomotion: Ambulation Assistive Devices: Designer, industrial/product Ambulation/Gait Assistance: 4: Min guard Locomotion: Ambulation: 2: Travels 50 - 149 ft with minimal assistance (Pt.>75%)  Comprehension Comprehension Mode: Auditory Comprehension: 6-Follows complex conversation/direction: With extra time/assistive device  Expression Expression Mode: Verbal Expression: 6-Expresses complex ideas: With extra time/assistive device  Social Interaction Social Interaction: 7-Interacts appropriately with others - No medications needed.  Problem Solving Problem Solving: 6-Solves complex problems: With extra time  Memory Memory: 6-More than reasonable amt of time  Medical Problem List and Plan:  1. DVT Prophylaxis/Anticoagulation: Pharmaceutical: Lovenox  2. Pain Management: decrease morphine as possible 3. Mood:  . Ego support daily by team. Continue cymbalta daily.  4. Neuropsych: This patient is capable of making decisions on his/her own behalf.  5. DM type 2: continue AC/HS CBG checks. BS previously uncontrolled due to steroids on board. Continue Amaryl and canagliflozin. Increased gluchophage xr to 1000mg  qday (on bid at home). Will need further adjustment once home to regimen 6. MS flare:steroids completed  Continue rebiff 3 X week.  7. Bronchitis with likely PNA: completed levaquin.    -cough on occasion- mucinex, IS-improved 8. Hypotension,lethargy    -better- held norvasc  -cbg's were low. Labs ok  -i resumed steroids. Continue prednisone at 20mg  qday for 1  week then 10mg  qday for one week then off  -ua equivocal, culture likely contaminated 9. HTN: continue norvasc, avapro and atenolol daily. Monitor BP on bid basis.  10. Neurogenic bladder:   Regular toileting  -ditropan has helped urinary frequency--continue as an outpt, could convert to xr form   LOS (Days) 15 A FACE TO FACE EVALUATION WAS PERFORMED  Electra Paladino T 08/07/2012 7:25 AM

## 2012-08-11 NOTE — Progress Notes (Signed)
Discharge summary # [id number]

## 2012-08-12 NOTE — Discharge Summary (Signed)
NAME:  Laura Roberts, Laura Roberts NO.:  [id number]  MEDICAL RECORD NO.:  [id number]  LOCATION:  4004                         FACILITY:  [facility]  PHYSICIAN:  Ranelle Oyster, M.D.DATE OF BIRTH:  September 10, 1940  DATE OF ADMISSION:  07/23/2012 DATE OF DISCHARGE:  08/07/2012                              DISCHARGE SUMMARY   DISCHARGE DIAGNOSES: 1. Multiple sclerosis with thoracic myelopathy. 2. Diabetes mellitus type 2. 3. Multiple sclerosis flare  4. Neurogenic bladder. 5. Bronchitis.  HISTORY OF PRESENT ILLNESS:  [name] is a 72 year old female with history of hypertension, multiple sclerosis, who reported recent URI symptoms for a bout a week with significant lower extremity weakness and inability to walk. She was evaluated by Neurology and sent to the ED on July 21, 2012, for treatment of multiple sclerosis exacerbation. IV Solu-Medrol was initiated.  There was question of purulent bronchitis with early pneumonia and she was started on antibiotics as well as supportive care.  MRI of spine showed new spinal stenosis, C4-C5 with 2 small probably demyelinating plaques at C2, C3, and C7 that was stable, severe multifactorial thoracic spinal stenosis with increased cord compression T11-T12, diffuse spinal stenosis, severe at L3-S1 and moderate-to-severe distention of bladder.  Dr. Newell Coral was consulted and recommended treating multiple sclerosis flare with followup on outpatient basis for need for surgery if she did not improve.  Neurology recommends 5 total days of IV steroids for multiple sclerosis flare. Therapies were initiated and team was recommended CIR for progression.  The patient was admitted on July 23, 2012, for further therapies.  PAST MEDICAL HISTORY:  Hypertension, multiple sclerosis, DM type 2, and DJD.  FUNCTIONAL HISTORY:  The patient was independent for mobility with rolling walker. She was independent for ADL and basic home management tasks.    FUNCTIONAL STATUS:  The patient was +2 total assist 50%-70% for transfers, +2 total assist 70% for ambulating 8 feet with rolling walker with cues to for weight shifting and advancing a rolling walker.  She required setup assist for grooming, setup for upper body bathing and dressing.  Mod assist for lower body bathing and dressing.  LABORATORY DATA:  Check of electrolytes from August 04, 2012, revealed sodium 136, potassium 4.7, chloride 101, CO2 of 25, BUN 17, creatinine 0.91, glucose 239.  Most recent CBC from August 05, 2012, reveals hemoglobin 12.7, hematocrit 39.3, white count 12.6, platelets 252.  IMAGING STUDIES:  Followup chest x-ray of July 24, 2012, showed borderline cardiomegaly and no acute disease.  HOSPITAL COURSE:  [name] was admitted to Rehab on July 23, 2012, for inpatient therapies to consist of PT, OT at least 3 hours 5 days a week.  Past admission, physiatrist, rehab, RN, and Therapy Team have worked together to provide customized collaborative interdisciplinary care.  Rehab RN has worked with the patient on bowel and bladder program as well as safety plan.  The patient's blood pressures were monitored on b.i.d. basis and these were noted to be reasonably controlled.  Blood sugars were monitored on a.c. and nightly basis and were noted to be elevated due to steroids.  She was treated with slow taper of steroids. The patient's blood sugars did  improve with taper of steroids therefore Lantus was discontinued and metformin has been resumed.   She did have worsening of her symptoms once prednisone was discontinued. Therefore this was resumed at 20 mg p.o. per day with slow taper to continue over next two weeks.   Labs were done past admission showing the patient to have leukocytosis with white count at 23.1.  She was monitored for fevers or signs of infection. Followup chest x-ray showed no evidence of acute disease.  Bronchitis has been treated  symptomatically.   Neurogenic bladder has been treated with regular toileting. Post void checks done revealed volumes at 18-31 cc there fore, ditropan was added to help with requency. During the patient's stay in rehab, team conferences were held to monitor the patient's progress, set goals, as well as discuss barriers to discharge.   Physical Therapy has worked with the patient on activity tolerance, balance, strengthening, as well as improving functional use of lower extremities.  The patient has made good progress and is at modified independent level for bed mobility and transfers.  She is min-guard assist for ambulating 50 feet with rolling walker.  She requires min assist when fatigued as this causes instability of knees. Home evaluation was done with recommendations made to improve safety at home.  OT has worked with the patient on self-care tasks.  The patient is able to complete bathing and dressing at modified independent level. She is able to perform simple housekeeping tasks at modified independent level from her wheelchair.  She is able to perform task in safe manner and use energy conservation techniques as needed.  The patient has supportive friends who will be there to provide assistance as needed.  Further followup home health therapies to continue past discharge.  On August 07, 2012, the patient was discharged home in improved condition.  DISCHARGE MEDICATIONS: 1. Tessalon Perles 100 mg p.o. t.i.d. p.r.n. cough. 2. Ditropan 5 mg p.o. t.i.d. 3. Prednisone 20 mg p.o. per day for 1 week, then decrease to half pill p.o. per day x1 week. 4. Metformin 1000 mg p.o. b.i.d. 5. Albuterol inhaler 2 puffs q.i.d. p.r.n. cough. 6. Atenolol 50 mg p.o. per day. 7. Calcium plus D 1/2 p.o. b.i.d. 8. Tums 1 p.o. per day p.r.n. 9. Cymbalta 60 mg p.o. per day. 10. Gabapentin 300 mg p.o. q.i.d. 11. Amaryl 4 mg p.o. b.i.d. 12. Robitussin DM 5 mL p.o. q.i.d. p.r.n. cough. 13. Invokana 100 mg p.o.  per day. 14. Melatonin 3 mg p.o. at bedtime. 15. Pravastatin 40 mg p.o. per day. 16. Rebif 44 mcg subcu on Monday, Wednesday, Friday. 17. Valsartan 320 mg p.o. per day.  DIET:  Diabetic diet.   ACTIVITIES:  As tolerated at wheelchair level, intermittent supervision.  SPECIAL INSTRUCTIONS:  Do not use amlodipine.  [facility] to provide PT, OT. Check blood sugars on bid basis. Incentive spirometry q.i.d.   FOLLOWUP:  The patient to follow up with Dr. Nicholos Johns on August 13, 2012, at 11 a.m.  Follow up with Dr.  Riley Kill on September 02, 2012, at 11:30 for 12 noon appointment.     Delle Reining, P.A.   ______________________________ Ranelle Oyster, M.D.    PL/MEDQ  D:  08/11/2012  T:  08/12/2012  Job:  [id number]  cc:   Georgianne Fick, M.D.

## 2012-09-02 ENCOUNTER — Encounter: Payer: Medicare Other | Attending: Physical Medicine & Rehabilitation | Admitting: Physical Medicine & Rehabilitation

## 2012-09-02 ENCOUNTER — Encounter: Payer: Self-pay | Admitting: Physical Medicine & Rehabilitation

## 2012-09-02 VITALS — BP 116/70 | HR 89 | Resp 14 | Ht 62.0 in | Wt 196.7 lb

## 2012-09-02 DIAGNOSIS — M4714 Other spondylosis with myelopathy, thoracic region: Secondary | ICD-10-CM | POA: Insufficient documentation

## 2012-09-02 DIAGNOSIS — G35 Multiple sclerosis: Secondary | ICD-10-CM | POA: Insufficient documentation

## 2012-09-02 DIAGNOSIS — E119 Type 2 diabetes mellitus without complications: Secondary | ICD-10-CM | POA: Insufficient documentation

## 2012-09-02 DIAGNOSIS — M792 Neuralgia and neuritis, unspecified: Secondary | ICD-10-CM | POA: Insufficient documentation

## 2012-09-02 DIAGNOSIS — N319 Neuromuscular dysfunction of bladder, unspecified: Secondary | ICD-10-CM | POA: Insufficient documentation

## 2012-09-02 DIAGNOSIS — I10 Essential (primary) hypertension: Secondary | ICD-10-CM | POA: Insufficient documentation

## 2012-09-02 DIAGNOSIS — M79609 Pain in unspecified limb: Secondary | ICD-10-CM | POA: Insufficient documentation

## 2012-09-02 DIAGNOSIS — G5790 Unspecified mononeuropathy of unspecified lower limb: Secondary | ICD-10-CM

## 2012-09-02 MED ORDER — GABAPENTIN 600 MG PO TABS: 600.0000 mg | ORAL_TABLET | Freq: Three times a day (TID) | ORAL | Status: DC

## 2012-09-02 NOTE — Patient Instructions (Signed)
CONTINUE TO WORK AGGRESSIVELY ON STRENGTH EXERCISES FOR YOUR HIPS AND KNEES.  IF YOU HAVE ANY PROBLEMS, PLEASE CALL

## 2012-09-02 NOTE — Progress Notes (Signed)
Subjective:    Patient ID: Laura Roberts, female    DOB: 23-Dec-1940, 72 y.o.   MRN: [id number]  HPI  Laura Roberts is back regarding her MS. She has been working with therapies at home. She has been standing and walking a few feet with belt/rolling walker. Therapy has been coming out twice a week. She is concerned that she hasn't progressed as she had hoped. Today is the first time she's been out of the house. She has weaned completely off the prednisone as of 2 weeks ago. She continues to take rebif.   She describes to me a hot, rushing, electrical pain in the right leg running down to her toes when she stands up and bears weight. Once she sits down, it takes several minutes for it to go away.   Her bowel and bladder function has remained stable. The ditropan has helped with her urinary urgency.   [facility] her in [place]. She is quite limited in resources for transportation to appointments.   Pain Inventory Average Pain 4 Pain Right Now 2 My pain is intermittent  In the last 24 hours, has pain interfered with the following? General activity 4 Relation with others 4 Enjoyment of life 4 What TIME of day is your pain at its worst? morning and evening Sleep (in general) Fair  Pain is worse with: standing and some activites Pain improves with: therapy/exercise and medication Relief from Meds: 2  Mobility ability to climb steps?  no do you drive?  yes use a wheelchair  Function retired I need assistance with the following:  household duties and shopping  Neuro/Psych bladder control problems weakness numbness tingling trouble walking depression anxiety  Prior Studies Any changes since last visit?  no  Physicians involved in your care Any changes since last visit?  no   Family History  Problem Relation Age of Onset  . Multiple sclerosis Other     neices.    History   Social History  . Marital Status: Widowed    Spouse Name: N/A     Number of Children: N/A  . Years of Education: N/A   Social History Main Topics  . Smoking status: Never Smoker   . Smokeless tobacco: Never Used  . Alcohol Use: No  . Drug Use: No  . Sexually Active: No   Other Topics Concern  . None   Social History Narrative  . None   Past Surgical History  Procedure Laterality Date  . Replacement total knee bilateral  2004  . Abdominal hysterectomy    . Cholecystectomy    . Back surgery    . Left hand carpal tunnel surgery     Past Medical History  Diagnosis Date  . Hypertension   . Multiple sclerosis   . Diabetes mellitus without complication   . Neuromuscular disorder     Bilateral hand carpal tunnel syndrome   BP 116/70  Pulse 89  Resp 14  Ht 5\' 2"  (1.575 m)  Wt 196 lb 11.2 oz (89.223 kg)  BMI 35.97 kg/m2  SpO2 98%    Review of Systems  Constitutional: Positive for diaphoresis and unexpected weight change.  Genitourinary: Positive for difficulty urinating.       Bladder control problems  Musculoskeletal: Positive for gait problem.  Neurological: Positive for weakness and numbness.       Tingling  Psychiatric/Behavioral: Positive for dysphoric mood. The patient is nervous/anxious.   All other systems reviewed and are negative.  Objective:   Physical Exam  General: Alert and oriented x 3, No apparent distress. Weight is stable. HEENT: Head is normocephalic, atraumatic, PERRLA, EOMI, sclera anicteric, oral mucosa pink and moist, dentition intact, ext ear canals clear,  Neck: Supple without JVD or lymphadenopathy Heart: Reg rate and rhythm. No murmurs rubs or gallops Chest: CTA bilaterally without wheezes, rales, or rhonchi; no distress Abdomen: Soft, non-tender, non-distended, bowel sounds positive. Extremities: No clubbing, cyanosis, or edema. Pulses are 2+ Skin: Clean and intact without signs of breakdown Neuro: Pt is cognitively appropriate with normal insight, memory, and awareness. Cranial nerves 2-12  are intact. Sensory exam is slightly diminished to FT RLE.  Reflexes are 3+ in all 4's. Fine motor coordination is diminished in both LE;s. No tremors. Motor function is grossly 5/5 in UE except for left deltoid which is 3+. RLE is 1+ to 2- at HF, KE, trace ADF and APF. LLE is 3+ LHF, 3KE, 3 at ankle. Mild heel cord contracture right ankle.  Musculoskeletal:  . Posture appropriate Psych: Pt's affect is appropriate. Pt is cooperative         Assessment & Plan:  1. Multiple sclerosis with thoracic myelopathy.  2. Diabetes mellitus type 2. Improving off steroids. 3. Multiple sclerosis flare  4. Neurogenic bladder. 5. RLE neuro pain due to #1  Plan: 1. Increase gabapentin to 600mg  TID. Observe for any intolerances. Consider adding another medication for neuropathic pain depending upon her response. 2. Outpt follow up with neurology as scheduled. Continue rebif 3. Reviewed HEP including her hip and knee musculature which are most important for her standing and walking. 4. DM control per family md. 5. Follow up in 2 months. 30 minutes of face to face patient care time were spent during this visit. All questions were encouraged and answered.

## 2012-09-15 ENCOUNTER — Telehealth: Payer: Self-pay

## 2012-09-15 NOTE — Telephone Encounter (Signed)
Patient called to say gabapentin is not working.  She wants to know what to do?  Patient says her legs are like rubber.  Patient is going to see [facility] tomorrow and will call after to set up an appointment if they can't do anything for her.

## 2012-09-24 ENCOUNTER — Telehealth: Payer: Self-pay

## 2012-09-24 NOTE — Telephone Encounter (Signed)
[facility] with [facility] called regarding patient safety.  Patient had been declining the last few weeks.  She is unable to transfer safely and lives alone.  Therapist went to patients house the other day and found patient on the floor from the day before.  Amber would like to know if patient could be put on a dose of solumedrol?  Advised Amber to have patient go to ED or contact Dr Sandria Manly.

## 2012-10-01 ENCOUNTER — Ambulatory Visit: Payer: Medicare Other | Admitting: Rehabilitative and Restorative Service Providers"

## 2012-10-01 ENCOUNTER — Other Ambulatory Visit: Payer: Self-pay | Admitting: *Deleted

## 2012-10-01 MED ORDER — OXYBUTYNIN CHLORIDE 5 MG PO TABS: 5.0000 mg | ORAL_TABLET | Freq: Three times a day (TID) | ORAL | Status: DC

## 2012-10-01 NOTE — Telephone Encounter (Signed)
Oxybutynin refilled per fax request from pharmacy.

## 2012-10-13 ENCOUNTER — Ambulatory Visit (INDEPENDENT_AMBULATORY_CARE_PROVIDER_SITE_OTHER): Payer: Medicare Other | Admitting: Nurse Practitioner

## 2012-10-13 ENCOUNTER — Encounter: Payer: Self-pay | Admitting: Nurse Practitioner

## 2012-10-13 VITALS — BP 105/72 | HR 89

## 2012-10-13 DIAGNOSIS — G5790 Unspecified mononeuropathy of unspecified lower limb: Secondary | ICD-10-CM

## 2012-10-13 DIAGNOSIS — G35 Multiple sclerosis: Secondary | ICD-10-CM

## 2012-10-13 DIAGNOSIS — R269 Unspecified abnormalities of gait and mobility: Secondary | ICD-10-CM

## 2012-10-13 DIAGNOSIS — M792 Neuralgia and neuritis, unspecified: Secondary | ICD-10-CM

## 2012-10-13 DIAGNOSIS — M216X9 Other acquired deformities of unspecified foot: Secondary | ICD-10-CM

## 2012-10-13 DIAGNOSIS — G35D Multiple sclerosis, unspecified: Secondary | ICD-10-CM

## 2012-10-13 DIAGNOSIS — N319 Neuromuscular dysfunction of bladder, unspecified: Secondary | ICD-10-CM

## 2012-10-13 DIAGNOSIS — M21372 Foot drop, left foot: Secondary | ICD-10-CM | POA: Insufficient documentation

## 2012-10-13 DIAGNOSIS — M21371 Foot drop, right foot: Secondary | ICD-10-CM | POA: Insufficient documentation

## 2012-10-13 NOTE — Progress Notes (Signed)
HPI: Patient returns to followup after last visit with Dr. Sandria Manly 07/21/2012. Her chief complaint that day with inability to walk and a cough, she had noticed increasing gait disorder since being exposed to a viral illness from her granddaughters cousin with gradual difficulty walking when she felt her legs were locking up at the knees and feeling that she was stuck to the floor she had lower back pain bilaterally. She was admitted to the hospital for exacerbation of her MS. IV Solu-Medrol was initiated. She was started on antibiotics for early pneumonia. MRI of the spine showed new spinal stenosis C4-C5 with 2 small demyelinating plaques at C2-C3 and C7, severe multifactoral thoracic spinal stenosis with increased cord compression T11-T12 and diffuse spinal stenosis severe at L3 through S1. She was evaluated by Dr. Newell Coral who recommended treating her MS flare first,  followup with him on an outpatient basis.  All therapies were initiated. She is seated in a wheelchair today. She is now going to [facility] 3 days a week 6 hours a day for therapy and medical management. She has a [facility] worker 5 days a week to assist with ADLs etc. She remains on prednisone, she's says she is making slow improvement in her condition. Ditropan has helped her urinary urgency. Gabapentin is controlling her neuropathic pain.  ROS:  - fatigue, swelling in legs, blurred vision, incontinence, feeling hot, joint pain, increased thrist, joint swelling, aching muscles, depression, anxiety, decreased energy, change in appetite, numbness, weakness, restless legs  Physical Exam General: well developed, well nourished, seated, in no evident distress Head: head normocephalic and atraumatic. Oropharynx benign Neck: supple with no carotid or supraclavicular bruits Cardiovascular: regular rate and rhythm, no murmurs  Neurologic Exam Mental Status: Awake and fully alert. Oriented to place and time. Recent and remote memory intact.  Attention span, concentration and fund of knowledge appropriate. Mood and affect appropriate.  Cranial Nerves: Fundoscopic exam reveals flat disc margins. Pupils equal, briskly reactive to light. Extraocular movements full without nystagmus. Visual fields full to confrontation. Mild left ptosis. Hearing intact and symmetric to finger snap.  Face, tongue, palate move normally and symmetrically. Neck flexion and extension normal.  Motor: Normal bulk and tone. Normal strength in upper extremities. Right leg 2-3/5 with foot drop. Left leg 3/5 with foot drop.  Sensory.:decreased pinprick in the right leg,  intact to touch vibratory and joint position in the right. Sensory exam normal in the left leg.   Coordination:  Finger-to-nose normal, unable to perform  heel-to-shin. Gait and Station: In wheelchair not ambulated Reflexes: absent knee and ankle jerks bil. Upgoing plantar on the right and neutral on the left.    ASSESSMENT: Multiple sclerosis , chronic back pain, multilevel spinal stenosis, gait abnormality, neuropathic pain, urinary urgency     PLAN: Pt to continue Rebif 3x weekly, given information on 0 copay. Labs being [facility] at [facility].   [facility] for 6 hours and will continue Patient has [facility] worker 5 times weekly to assist with ADL's etc.  Continue Prednisone Continue Physical therapy. Occ. Therapy F/U with Dr. Anne Hahn in 3 months  Nilda Riggs, [facility] APRN

## 2012-10-13 NOTE — Patient Instructions (Addendum)
Pt to continue Rebif Patient is going to [facility] x weekly and will continue Patient has CAP worker 5 times weekly Continue Prednisone Continue Physical therapy. Occ. Therapy F/U with Dr. Anne Hahn in 3 months

## 2012-10-13 NOTE — Progress Notes (Signed)
I have read the note, and I agree with the clinical assessment and plan.  

## 2012-10-30 ENCOUNTER — Ambulatory Visit: Payer: Medicare Other | Admitting: Physical Medicine & Rehabilitation

## 2012-11-16 ENCOUNTER — Telehealth: Payer: Self-pay | Admitting: Neurology

## 2012-11-16 NOTE — Telephone Encounter (Signed)
I called patient and I left a message. The patient has had some worsening of her symptoms, but she has evidence of spinal cord compression in the lower thoracic area by a recent MRI. We will need to get a revisit in the near future.

## 2012-11-16 NOTE — Telephone Encounter (Signed)
Sonja from [place] says PCP is requesting acute visit for patient. Symptoms (MS flare) have increased tremendously. Requesting OV asap. Contact# [phone]

## 2012-11-18 ENCOUNTER — Encounter: Payer: Self-pay | Admitting: Neurology

## 2012-11-18 ENCOUNTER — Ambulatory Visit (INDEPENDENT_AMBULATORY_CARE_PROVIDER_SITE_OTHER): Payer: Medicare (Managed Care) | Admitting: Neurology

## 2012-11-18 ENCOUNTER — Telehealth: Payer: Self-pay | Admitting: Neurology

## 2012-11-18 VITALS — BP 136/91 | HR 88

## 2012-11-18 DIAGNOSIS — G35 Multiple sclerosis: Secondary | ICD-10-CM

## 2012-11-18 DIAGNOSIS — R531 Weakness: Secondary | ICD-10-CM

## 2012-11-18 DIAGNOSIS — R269 Unspecified abnormalities of gait and mobility: Secondary | ICD-10-CM

## 2012-11-18 DIAGNOSIS — R5381 Other malaise: Secondary | ICD-10-CM

## 2012-11-18 NOTE — Telephone Encounter (Signed)
called [facility] for more information, and Chales Abrahams said that his notes were a little unclear.Inform that we could fax over last office notes, they agreed,faxed notes to [facility]

## 2012-11-18 NOTE — Progress Notes (Signed)
Reason for visit: Multiple sclerosis  Laura Roberts is an 72 y.o. female  History of present illness:  Laura Roberts is a 72 year old right-handed black female with a history of multiple sclerosis. The patient gives a history of an alteration in her ability to ambulate in January 2014. The patient was admitted to the hospital, and MRI evaluation of the cervical, thoracic and lumbosacral spinal areas were imaged. Evidence of T11-T12 spinal stenosis the spinal cord compression was noted. The patient has had a prior laminectomy in this area. The patient is being maintained on Rebif. The patient seemed to improve with rehabilitation, but the patient has lost her ability to ambulate in March of 2013. The patient indicates that in late 2013, she was ambulatory with a walker, and doing relatively well in this regard. The [facility] is currently followed through [facility], and she is receiving aid and assistance in the home environment, and she is getting physical therapy. The patient lives alone. The patient denies any significant changes in bowel or bladder function. The patient indicates that when her leg weakness began, she did have back pain and pain down both legs. The pain has since resolved. The patient is on gabapentin at this time. The patient returns for an evaluation.  Past Medical History  Diagnosis Date  . Hypertension   . Multiple sclerosis   . Diabetes mellitus without complication   . Neuromuscular disorder     Bilateral hand carpal tunnel syndrome  . Gait disturbance   . Obesity   . Spinal stenosis in cervical region   . Spinal stenosis of lumbosacral region   . Spinal stenosis, thoracic     Past Surgical History  Procedure Laterality Date  . Replacement total knee bilateral  2004  . Abdominal hysterectomy    . Cholecystectomy    . Back surgery    . Left hand carpal tunnel surgery      Family History  Problem Relation Age of Onset  . Multiple sclerosis Other     neices.   .  Cancer Mother   . Diabetes Mother     Social history:  reports that she has never smoked. She has never used smokeless tobacco. She reports that she does not drink alcohol or use illicit drugs.  Allergies:  Allergies  Allergen Reactions  . Codeine Other (See Comments)    Difficult breathing and skin problem  . Ultram (Tramadol) Other (See Comments)    Difficult breathing and skin peeling    Medications:  Current Outpatient Prescriptions on File Prior to Visit  Medication Sig Dispense Refill  . albuterol (PROVENTIL) (5 MG/ML) 0.5% nebulizer solution Take 0.5 mLs (2.5 mg total) by nebulization every 6 (six) hours as needed for wheezing or shortness of breath.  20 mL  0  . atenolol (TENORMIN) 50 MG tablet Take 50 mg by mouth daily.      . benzonatate (TESSALON) 100 MG capsule Take 1 capsule (100 mg total) by mouth 3 (three) times daily as needed for cough.  20 capsule  1  . calcium carbonate (TUMS - DOSED IN MG ELEMENTAL CALCIUM) 500 MG chewable tablet Chew 1 tablet by mouth daily as needed. For heartburn      . Calcium Carbonate-Vitamin D (CALCIUM + D PO) Take 0.5 tablets by mouth 2 (two) times daily.      . Canagliflozin (INVOKANA) 100 MG TABS Take 100 mg by mouth daily.      . DULoxetine (CYMBALTA) 60 MG capsule Take 60  mg by mouth daily.      Marland Kitchen gabapentin (NEURONTIN) 600 MG tablet Take 1 tablet (600 mg total) by mouth 3 (three) times daily.  90 tablet  4  . glimepiride (AMARYL) 4 MG tablet Take 4 mg by mouth 2 (two) times daily.      Marland Kitchen guaiFENesin-dextromethorphan (ROBITUSSIN DM) 100-10 MG/5ML syrup Take 5 mLs by mouth every 4 (four) hours as needed for cough.  118 mL  0  . interferon beta-1a (REBIF) 44 MCG/0.5ML injection Inject 44 mcg into the skin 3 (three) times a week. Monday, Wednesday, and Friday      . Melatonin 3 MG TABS Take 3 mg by mouth at bedtime as needed. For sleep      . oxybutynin (DITROPAN) 5 MG tablet Take 1 tablet (5 mg total) by mouth 3 (three) times daily.  90  tablet  1  . pravastatin (PRAVACHOL) 40 MG tablet Take 40 mg by mouth daily.      . valsartan (DIOVAN) 320 MG tablet Take 320 mg by mouth daily.       No current facility-administered medications on file prior to visit.    ROS:  Out of a complete 14 system review of symptoms, the patient complains only of the following symptoms, and all other reviewed systems are negative.  Fatigue Swelling in the legs Blurred vision Incontinence of bowel and bladder Feeling hot, increased thirst Gait disturbance  Blood pressure 136/91, pulse 88.  Physical Exam  General: The patient is alert and cooperative at the time of the examination. The patient is moderately obese.  Skin: 2+ edema is noted below the knees bilaterally.   Neurologic Exam  Cranial nerves: Facial symmetry is present. Speech is normal, no aphasia or dysarthria is noted. Extraocular movements are full. Visual fields are full.  Motor: The patient has good strength in upper extremities. With the lower extremities, the patient has 3/5 strength proximally in the left leg, 2/5 strength in the right leg. Distally, the patient has 4/5 strength in the left leg, 2/5 strength in the right leg.  Coordination: The patient has good finger-nose-finger bilaterally. The patient was unable to perform heel-to-shin on either side.  Gait and station: The gait was not tested, the patient is wheelchair-bound, nonambulatory.  Reflexes: Deep tendon reflexes are symmetric, but are depressed.   Assessment/Plan:  One. Multiple sclerosis  2. Spinal stenosis, cervical, thoracic, and lumbosacral   The patient has evidence of spinal cord compression at the T11 level by MRI done in January of 2014. The patient indicates that she improved with her ability to ambulate transiently, but she has lost her ability to walk at this point. The patient has not been ambulatory since mid March of 2014. The patient is on oral prednisone, the actual dose is unknown.  The patient will be placed on Solu-Medrol 500 mg IV daily for 5 days, and then she will return back to her oral prednisone dosing. The patient will be set up for MRI evaluation of the thoracic spine once again to reevaluate for the spinal cord compression. If this appears to be a significant issue, the patient will be sent for neurosurgical evaluation through Dr. Newell Coral. The patient otherwise will followup in about 3 months. MRI evaluations done previously showed extensive involvement of the spinal cord with multiple sclerosis, and it is not clear whether the new onset paraparesis is associated with the MS cord lesions or with the spinal cord compression documented in the lower thoracic area.  C.  Lesia Sago MD 11/18/2012 7:17 PM  [facility] [address] [place], [place] [address]  Phone [phone] Fax [phone]

## 2012-11-20 NOTE — Telephone Encounter (Signed)
done

## 2012-11-30 ENCOUNTER — Ambulatory Visit
Admission: RE | Admit: 2012-11-30 | Discharge: 2012-11-30 | Disposition: A | Discharge status: Home / Self Care | Payer: No Typology Code available for payment source | Source: Ambulatory Visit | Attending: Neurology | Admitting: Neurology

## 2012-11-30 DIAGNOSIS — R269 Unspecified abnormalities of gait and mobility: Secondary | ICD-10-CM

## 2012-11-30 DIAGNOSIS — G35 Multiple sclerosis: Secondary | ICD-10-CM

## 2012-11-30 DIAGNOSIS — R531 Weakness: Secondary | ICD-10-CM

## 2012-11-30 NOTE — Telephone Encounter (Signed)
I called and LMVM for Laura Roberts with [facility] to call back re: solumedrol infusion.

## 2012-12-02 ENCOUNTER — Telehealth: Payer: Self-pay | Admitting: Neurology

## 2012-12-02 NOTE — Telephone Encounter (Signed)
I got a call from Dr. Clyde Lundborg. The patient has undergone MRI evaluation of the thoracic spine shows ongoing spinal cord compression at the T11 area. There is cord signal at this level. The patient is hesitant to consider surgical decompression, and she wants a 100% guarantee that she will walk again. This is very difficult to predict, given the history of multiple sclerosis. The patient may be given a trial on Solu-Medrol 500 mg IV daily for 5 days, and get on a prednisone taper. The patient will be seen back to this office in about 3 weeks.

## 2012-12-16 ENCOUNTER — Encounter: Payer: Self-pay | Admitting: Neurology

## 2012-12-16 ENCOUNTER — Telehealth: Payer: Self-pay | Admitting: Neurology

## 2012-12-16 ENCOUNTER — Ambulatory Visit (INDEPENDENT_AMBULATORY_CARE_PROVIDER_SITE_OTHER): Payer: Medicare (Managed Care) | Admitting: Neurology

## 2012-12-16 VITALS — BP 122/79 | HR 91

## 2012-12-16 DIAGNOSIS — G35 Multiple sclerosis: Secondary | ICD-10-CM

## 2012-12-16 DIAGNOSIS — R531 Weakness: Secondary | ICD-10-CM

## 2012-12-16 DIAGNOSIS — Z5181 Encounter for therapeutic drug level monitoring: Secondary | ICD-10-CM

## 2012-12-16 DIAGNOSIS — R5381 Other malaise: Secondary | ICD-10-CM

## 2012-12-16 DIAGNOSIS — R269 Unspecified abnormalities of gait and mobility: Secondary | ICD-10-CM

## 2012-12-16 LAB — CBC WITH DIFFERENTIAL
Basophils Absolute: 0 10*3/uL (ref 0.0–0.2)
Eosinophils Absolute: 0 10*3/uL (ref 0.0–0.4)
Lymphs: 10 % — ABNORMAL LOW (ref 14–46)
MCH: 29.8 pg (ref 26.6–33.0)
MCHC: 34.7 g/dL (ref 31.5–35.7)
Monocytes Absolute: 0.5 10*3/uL (ref 0.1–0.9)
Neutrophils Relative %: 86 % — ABNORMAL HIGH (ref 40–74)
Platelets: 219 10*3/uL (ref 150–379)
RDW: 15.3 % (ref 12.3–15.4)

## 2012-12-16 LAB — COMPREHENSIVE METABOLIC PANEL
ALT: 16 IU/L (ref 0–32)
AST: 8 IU/L (ref 0–40)
Albumin/Globulin Ratio: 2 (ref 1.1–2.5)
Alkaline Phosphatase: 55 IU/L (ref 39–117)
BUN/Creatinine Ratio: 21 (ref 11–26)
Calcium: 9.3 mg/dL (ref 8.6–10.2)
Creatinine, Ser: 0.48 mg/dL — ABNORMAL LOW (ref 0.57–1.00)
GFR calc non Af Amer: 100 mL/min/{1.73_m2} (ref >59)
Globulin, Total: 1.7 g/dL (ref 1.5–4.5)
Potassium: 4 mmol/L (ref 3.5–5.2)
Sodium: 138 mmol/L (ref 134–144)

## 2012-12-16 NOTE — Progress Notes (Signed)
Reason for visit: Multiple sclerosis  Laura Roberts is an 72 y.o. female  History of present illness:  Laura Roberts is a 72 year old right-handed black female with a history of multiple sclerosis, treated on Rebif. The patient has had brain and multiple spinal cord lesions documented by MRI. The patient had been doing relatively well with Rebif until January 2014. The patient began noting problems with lower extremity weakness, and difficulty with ambulation. The patient went into the hospital for high-dose steroid therapy, and she was noted to have some spinal stenosis at the T11-12 level, where she recently had decompressive surgery. The patient was not felt to require surgery at this point, as it was felt that her exacerbation was related to multiple sclerosis. The patient went to rehabilitation, and she seemed to improve with her walking. The patient was able to walk short distances with a walker, but by early June of 2014, the patient has had a significant decline in her ambulatory ability. The patient has had some ongoing problems with a neurogenic bowel and bladder, but there has been no significant change in this function. The patient is currently not able to ambulate. The patient was given another five-day course of Solu-Medrol, and she is on a prednisone taper at this point. The patient currently indicates that she is on 20 mg daily of prednisone, and she is going down very slowly, because she felt bad previously when the prednisone dose was lowered. The patient is getting therapy 3 times a week, and she requires a wheelchair for mobilization. The patient lives alone. The patient has aid and assistance coming into the house.  Past Medical History  Diagnosis Date  . Hypertension   . Multiple sclerosis   . Diabetes mellitus without complication   . Neuromuscular disorder     Bilateral hand carpal tunnel syndrome  . Gait disturbance   . Obesity   . Spinal stenosis in cervical region   .  Spinal stenosis of lumbosacral region   . Spinal stenosis, thoracic   . Degenerative arthritis     Past Surgical History  Procedure Laterality Date  . Replacement total knee bilateral  2004  . Abdominal hysterectomy    . Cholecystectomy    . Back surgery    . Left hand carpal tunnel surgery      Family History  Problem Relation Age of Onset  . Multiple sclerosis Other     neices.   . Cancer Mother   . Diabetes Mother     Social history:  reports that she has never smoked. She has never used smokeless tobacco. She reports that she does not drink alcohol or use illicit drugs.  Allergies:  Allergies  Allergen Reactions  . Codeine Other (See Comments)    Difficult breathing and skin problem  . Ultram (Tramadol) Other (See Comments)    Difficult breathing and skin peeling    Medications:  Current Outpatient Prescriptions on File Prior to Visit  Medication Sig Dispense Refill  . atenolol (TENORMIN) 50 MG tablet Take 50 mg by mouth daily.      . benzonatate (TESSALON) 100 MG capsule Take 1 capsule (100 mg total) by mouth 3 (three) times daily as needed for cough.  20 capsule  1  . Canagliflozin (INVOKANA) 100 MG TABS Take 100 mg by mouth daily.      . DULoxetine (CYMBALTA) 60 MG capsule Take 60 mg by mouth daily.      Marland Kitchen gabapentin (NEURONTIN) 600 MG tablet Take  1 tablet (600 mg total) by mouth 3 (three) times daily.  90 tablet  4  . glimepiride (AMARYL) 4 MG tablet Take 4 mg by mouth 2 (two) times daily.      . interferon beta-1a (REBIF) 44 MCG/0.5ML injection Inject 44 mcg into the skin 3 (three) times a week. Monday, Wednesday, and Friday      . Melatonin 3 MG TABS Take 3 mg by mouth at bedtime as needed. For sleep      . metFORMIN (GLUCOPHAGE) 1000 MG tablet Take 1,000 mg by mouth 2 (two) times daily with a meal.      . oxybutynin (DITROPAN) 5 MG tablet Take 1 tablet (5 mg total) by mouth 3 (three) times daily.  90 tablet  1  . pravastatin (PRAVACHOL) 40 MG tablet Take 40 mg  by mouth daily.      . predniSONE (DELTASONE) 5 MG tablet Take 20 mg by mouth daily.       . valsartan (DIOVAN) 320 MG tablet Take 320 mg by mouth daily.       No current facility-administered medications on file prior to visit.    ROS:  Out of a complete 14 system review of symptoms, the patient complains only of the following symptoms, and all other reviewed systems are negative.  Fevers, chills, weight gain, fatigue Swelling in the legs on prednisone Incontinence of bowel, urination problems Feeling hot, cold, increased thirst Joint pain, joint swelling, muscle cramps, achy muscles Numbness, weakness Depression Not enough sleep, decreased energy Restless legs  Blood pressure 122/79, pulse 91.  Physical Exam  General: The patient is alert and cooperative at the time of the examination. The patient is moderately obese.  Skin: 2-3+ edema is noted below the knees bilaterally.   Neurologic Exam  Cranial nerves: Facial symmetry is present. Speech is normal, no aphasia or dysarthria is noted. Extraocular movements are full. Visual fields are full. Pupils are equal, round, and reactive to light. Discs are flat bilaterally.  Motor: The patient has good strength in the upper extremities. With the lower extremities, the patient has 4 minus/5 strength proximally of the left leg, and with extension and flexion at the knee. On the right, the patient has 3/5 strength proximally and distally.  Coordination: The patient has good finger-nose-finger bilaterally. The patient is not able to perform heel-to-shin on either side.  Gait and station: The patient could not be ambulated, is wheelchair-bound.  Reflexes: Deep tendon reflexes are symmetric.   Assessment/Plan:  One. Multiple sclerosis  2. Gait disorder  3. Spinal stenosis, T11-12 level  The patient has a multifactorial issue, and she does have some spinal stenosis at the T11-12 level, but after further review, the spinal  stenosis did not appear to be critical. The patient does have evidence of prior spinal cord injury at this level. The patient has multiple spinal cord lesions consistent with multiple sclerosis. The patient likely does have an MS exacerbation once again, leading to worsening gait problems. The patient is on a slow prednisone taper. The patient is already in physical therapy. The patient has done poorly on Rebif over the last 6 months. We will discontinue Rebif, and switch to Tecfidera. The patient will followup in 3 months. Blood work will be done today.   Marlan Palau MD 12/16/2012 7:30 PM  [facility] [address] [place], [place] [address]  Phone [phone] Fax [phone]

## 2012-12-16 NOTE — Telephone Encounter (Signed)
I called patient. The blood work revealed an elevation in the blood sugar at about 230, which is not unusual for her. The white blood count is elevated to 13, but the patient is on prednisone. No elevation in liver enzymes. We should be okay with getting on Tecfidera.

## 2012-12-17 ENCOUNTER — Telehealth: Payer: Self-pay | Admitting: Neurology

## 2012-12-17 NOTE — Telephone Encounter (Signed)
Noted  

## 2012-12-17 NOTE — Telephone Encounter (Signed)
Yolanda with [facility] relaying message from Dr. Pasty Spillers to Dr. Anne Hahn that it is OK to start Tecfidera.  She asked that the prescription go through their pharmacy [facility] and can be faxed to [phone].  I told her there is an application and specialty pharmacy involved to initiate the Tecfidera , and we will check on process and let them know.

## 2012-12-23 NOTE — Telephone Encounter (Signed)
[facility] called back. I spoke with them.  Advised application was sent and is pending benefit investigation, once this is completed, [employer] will triage the Rx to the contacted Pharmacy.  Also gave them the phone number to contact the Tecfidera program, [phone].  Advised [facility] they will only speak to the patient.

## 2013-01-22 ENCOUNTER — Telehealth: Payer: Self-pay

## 2013-01-22 NOTE — Telephone Encounter (Signed)
Laura Roberts from [employer] called, left message requesting we contact new ins provider [employer] at [phone] to submit a new Prior Auth.  I have contacted them and they asked that we complete the PA forms and submit them.  I have done so via covermymeds.  Pending response.

## 2013-03-02 ENCOUNTER — Ambulatory Visit: Payer: Medicare Other | Admitting: Neurology

## 2013-04-19 NOTE — Progress Notes (Signed)
This note has been reviewed and this clinician agrees with information provided.  

## 2013-06-11 ENCOUNTER — Ambulatory Visit: Payer: Self-pay | Admitting: Neurology

## 2013-07-07 ENCOUNTER — Encounter: Payer: Self-pay | Admitting: Neurology

## 2013-07-07 ENCOUNTER — Ambulatory Visit (INDEPENDENT_AMBULATORY_CARE_PROVIDER_SITE_OTHER): Payer: Medicare (Managed Care) | Admitting: Neurology

## 2013-07-07 ENCOUNTER — Encounter (INDEPENDENT_AMBULATORY_CARE_PROVIDER_SITE_OTHER): Payer: Self-pay

## 2013-07-07 VITALS — BP 98/61 | HR 80 | Wt 170.0 lb

## 2013-07-07 DIAGNOSIS — R269 Unspecified abnormalities of gait and mobility: Secondary | ICD-10-CM

## 2013-07-07 DIAGNOSIS — Z5181 Encounter for therapeutic drug level monitoring: Secondary | ICD-10-CM

## 2013-07-07 DIAGNOSIS — G35 Multiple sclerosis: Secondary | ICD-10-CM

## 2013-07-07 MED ORDER — PREDNISONE 5 MG PO TABS: ORAL_TABLET | ORAL | Status: DC

## 2013-07-07 MED ORDER — BACLOFEN 10 MG PO TABS: ORAL_TABLET | ORAL | Status: DC

## 2013-07-07 NOTE — Patient Instructions (Signed)
Multiple Sclerosis Multiple sclerosis (MS) is a disease of the central nervous system. Its cause is unknown. It is more common in the northern states than in the southern states. There is a higher incidence of MS in women. There is a wide variation in the symptoms (problems) of MS. This is because of the many different ways it affects the central nervous system. It often comes on in episodes or attacks. These attacks may last weeks to months. There may be long periods of nearly no problems between attacks. The main symptoms include visual problems (associated with eye pain), numbness, weakness, and paralysis in extremities (arms/hands and legs/feet). There may also be tremors and problems with balance and walking. The age when MS starts is variable. Advances in medicine continue to improve the treatment of this illness. There is no known cure for MS but there are medications that help. MS is not an inherited illness, although your risk of getting this disease is higher if you have a relative with MS. The best radiologic (x-ray) study for MS is an MRI (magnetic resonance imaging). There are medications available to decrease the number and frequency of attacks. SYMPTOMS  The symptoms of MS are caused by loss of insulation (myelin) of the nerves of the brain. When this happens, brain signals do not get transmitted properly or may not get transmitted at all. Some of the problems caused by this include:   Numbness.  Weakness.  Paralysis in extremities.  Visual problems, eye pain.  Balance problems.  Tremors. DIAGNOSIS  Your caregiver can do studies on you to make this diagnosis. This may include specialized X-rays and spinal fluid studies. HOME CARE INSTRUCTIONS   Take medications as directed by your caregiver. Baclofen is a drug commonly used to reduce muscle spasticity. Steroids are often used for short term relief.  Exercise as directed.  Use physical and occupational therapy as directed by  your caregiver. Careful attention to this medical care can help avoid depression.  See your caregiver if you begin to have problems with depression. This is a common problem in MS. Patients often continue to work many years after the diagnosis of MS. Document Released: 06/14/2000 Document Revised: 09/09/2011 Document Reviewed: 01/21/2007 [employer] Patient Information 2014 [employer].  

## 2013-07-07 NOTE — Progress Notes (Signed)
Reason for visit: Multiple sclerosis  Laura Roberts is an 73 y.o. female  History of present illness:  Laura Roberts is a 73 year old right-handed black female with a history of multiple sclerosis with a spastic paraparesis. The patient has multiple brain and spinal cord lesions. The patient had a significant exacerbation in her ability to ambulate in the spring of 2014. The patient lost her ability to walk independently, and she is wheelchair-bound at this point. The patient still living at home, and getting help from [facility], and she is getting physical therapy ongoing. The patient is using a standing frame, and she is starting to bear weight on her legs. The patient denies any significant changes in bowel or bladder control. The patient denies any weakness of the arms. The right leg is weaker than the left. The patient has spasticity in the legs, and the legs will take out at nighttime. The patient takes baclofen 10 mg 3 times daily. The patient has some left shoulder discomfort from transfers in and out of the wheelchair. The patient denies any significant pain issues otherwise. The patient returns for an evaluation. The patient was taken off of Rebif, and she has been switched to Tecfidera. The patient is tolerating the medication well.  Past Medical History  Diagnosis Date  . Hypertension   . Multiple sclerosis   . Diabetes mellitus without complication   . Neuromuscular disorder     Bilateral hand carpal tunnel syndrome  . Gait disturbance   . Obesity   . Spinal stenosis in cervical region   . Spinal stenosis of lumbosacral region   . Spinal stenosis, thoracic   . Degenerative arthritis     Past Surgical History  Procedure Laterality Date  . Replacement total knee bilateral  2004  . Abdominal hysterectomy    . Cholecystectomy    . Back surgery    . Left hand carpal tunnel surgery    . Cataract extraction Right     Family History  Problem Relation Age of Onset  . Multiple  sclerosis Other     neices.   . Cancer Mother   . Diabetes Mother   . GI Bleed Sister     diverticulitis    Social history:  reports that she has never smoked. She has never used smokeless tobacco. She reports that she does not drink alcohol or use illicit drugs.    Allergies  Allergen Reactions  . Codeine Other (See Comments)    Difficult breathing and skin problem  . Ultram [Tramadol] Other (See Comments)    Difficult breathing and skin peeling    Medications:  Current Outpatient Prescriptions on File Prior to Visit  Medication Sig Dispense Refill  . atenolol (TENORMIN) 50 MG tablet Take 50 mg by mouth daily.      . Canagliflozin (INVOKANA) 100 MG TABS Take 100 mg by mouth daily.      . DULoxetine (CYMBALTA) 60 MG capsule Take 60 mg by mouth daily.      . metFORMIN (GLUCOPHAGE) 1000 MG tablet Take 1,000 mg by mouth 2 (two) times daily with a meal.      . pravastatin (PRAVACHOL) 40 MG tablet Take 40 mg by mouth daily.      . valsartan (DIOVAN) 320 MG tablet Take 320 mg by mouth daily.      . benzonatate (TESSALON) 100 MG capsule Take 1 capsule (100 mg total) by mouth 3 (three) times daily as needed for cough.  20 capsule  1   No current facility-administered medications on file prior to visit.    ROS:  Out of a complete 14 system review of symptoms, the patient complains only of the following symptoms, and all other reviewed systems are negative.  Activity change, appetite change, fatigue, excessive sweating Leg swelling Restless legs Incontinence of bladder Back pain, walking difficulty Numbness, weakness Depression  Blood pressure 98/61, pulse 80, weight 170 lb (77.111 kg).  Physical Exam  General: The patient is alert and cooperative at the time of the examination. The patient was moderately obese.  Skin: No significant peripheral edema is noted.   Neurologic Exam  Mental status: The patient is oriented x 3.  Cranial nerves: Facial symmetry is present.  Speech is normal, no aphasia or dysarthria is noted. Extraocular movements are full. Visual fields are full. Pupils are equal, round, and react to light. Discs are flat bladder.  Motor: The patient has good strength in the upper extremities. The patient has 3/5 strength bilaterally in the lower extremities, the right leg is slightly weaker. The patient has a significant footdrop on the right, not present on the left.  Sensory examination: Soft touch sensation on the face, arms, and legs is symmetric.  Coordination: The patient has good finger-nose-finger bilaterally. The patient is unable to perform heel-to-shin on either side.  Gait and station: The patient is unable to ambulate, she is wheelchair-bound.  Reflexes: Deep tendon reflexes are symmetric.   Assessment/Plan:  One. Multiple sclerosis  2. Spastic paraparesis  The patient will continue the Tecfidera for now. Blood work will be done today. The patient will have ongoing physical therapy, and she will return in about 6 months. The baclofen will be increased to taking 10 mg twice a day, 20 mg at night. A prescription was given for the baclofen.  Jill Alexanders MD 07/07/2013 9:26 PM  [facility] [address] [place], [place] [address]  Phone [phone] Fax [phone]

## 2013-07-08 ENCOUNTER — Telehealth: Payer: Self-pay | Admitting: Neurology

## 2013-07-08 LAB — COMPREHENSIVE METABOLIC PANEL
A/G RATIO: 2.4 (ref 1.1–2.5)
ALBUMIN: 4.3 g/dL (ref 3.5–4.8)
ALK PHOS: 77 IU/L (ref 39–117)
ALT: 12 IU/L (ref 0–32)
AST: 8 IU/L (ref 0–40)
BUN / CREAT RATIO: 24 (ref 11–26)
BUN: 26 mg/dL (ref 8–27)
CALCIUM: 9.8 mg/dL (ref 8.6–10.2)
CO2: 25 mmol/L (ref 18–29)
CREATININE: 1.08 mg/dL — AB (ref 0.57–1.00)
Chloride: 99 mmol/L (ref 97–108)
GFR calc Af Amer: 60 mL/min/{1.73_m2} (ref >59)
GFR, EST NON AFRICAN AMERICAN: 52 mL/min/{1.73_m2} — AB (ref >59)
GLOBULIN, TOTAL: 1.8 g/dL (ref 1.5–4.5)
Glucose: 131 mg/dL — ABNORMAL HIGH (ref 65–99)
Potassium: 4.9 mmol/L (ref 3.5–5.2)
SODIUM: 140 mmol/L (ref 134–144)
Total Bilirubin: 0.3 mg/dL (ref 0.0–1.2)
Total Protein: 6.1 g/dL (ref 6.0–8.5)

## 2013-07-08 LAB — CBC WITH DIFFERENTIAL
BASOS: 1 %
Basophils Absolute: 0 10*3/uL (ref 0.0–0.2)
EOS ABS: 0.1 10*3/uL (ref 0.0–0.4)
Eos: 2 %
HCT: 39.9 % (ref 34.0–46.6)
HEMOGLOBIN: 13.1 g/dL (ref 11.1–15.9)
IMMATURE GRANS (ABS): 0 10*3/uL (ref 0.0–0.1)
Immature Granulocytes: 0 %
LYMPHS ABS: 1 10*3/uL (ref 0.7–3.1)
Lymphs: 13 %
MCH: 28.7 pg (ref 26.6–33.0)
MCHC: 32.8 g/dL (ref 31.5–35.7)
MCV: 87 fL (ref 79–97)
MONOS ABS: 0.7 10*3/uL (ref 0.1–0.9)
Monocytes: 9 %
NEUTROS PCT: 75 %
Neutrophils Absolute: 5.9 10*3/uL (ref 1.4–7.0)
Platelets: 330 10*3/uL (ref 150–379)
RBC: 4.57 x10E6/uL (ref 3.77–5.28)
RDW: 14.9 % (ref 12.3–15.4)
WBC: 7.9 10*3/uL (ref 3.4–10.8)

## 2013-07-08 NOTE — Telephone Encounter (Signed)
I called patient. The blood work was unremarkable with exception that the renal function appears to have changed over the last 6 months. GFR is now 52. Tecfidera generally does not result in problems with renal function, but the patient does have diabetes. I'll send this report to her primary care physician.

## 2014-01-05 ENCOUNTER — Encounter: Payer: Self-pay | Admitting: Adult Health

## 2014-01-05 ENCOUNTER — Ambulatory Visit (INDEPENDENT_AMBULATORY_CARE_PROVIDER_SITE_OTHER): Payer: Medicare (Managed Care) | Admitting: Adult Health

## 2014-01-05 VITALS — BP 103/67 | HR 82

## 2014-01-05 DIAGNOSIS — G35 Multiple sclerosis: Secondary | ICD-10-CM

## 2014-01-05 DIAGNOSIS — R269 Unspecified abnormalities of gait and mobility: Secondary | ICD-10-CM

## 2014-01-05 DIAGNOSIS — Z5181 Encounter for therapeutic drug level monitoring: Secondary | ICD-10-CM

## 2014-01-05 NOTE — Progress Notes (Signed)
PATIENT: Laura Roberts DOB: [birth year]  REASON FOR VISIT: follow up HISTORY FROM: patient  HISTORY OF PRESENT ILLNESS: Laura Roberts is a 73 year old female with a history of multiple sclerosis with spastic paraparesis. She returns today for follow-up. The patient currently takes tecfidera and baclofen and is tolerating them well.  Baclofen dose was increased at the last visit and patient states that it is working better now. She is wheelchair bound. She has been completing physical therapy and occupational therapy. Patient states that she does feel tired but therapy is going well. Patient is able to do transfers by sliding herself from the wheelchair and using upper body strength. No issues with swallowing. Denies any new numbness or weakness. She does have carpal tunnel syndrome in both hands and has some weakness associated with that.  Overall she feels that she is doing better this visit.   REVIEW OF SYSTEMS: Full 14 system review of systems performed and notable only for:  Constitutional: Fatigue Eyes: N/A Ear/Nose/Throat: N/A  Skin: N/A  Cardiovascular:  Leg swelling                                                                                                                                                                                                                           Respiratory: N/A  Gastrointestinal: N/A  Genitourinary: Incontinence of bladder Hematology/Lymphatic: N/A  Endocrine: Heat intolerance  Musculoskeletal: Back pain, walking difficulty  Allergy/Immunology: N/A  Neurological: Numbness, weakness Psychiatric: Depression Sleep: Restless leg   ALLERGIES: Allergies  Allergen Reactions  . Codeine Other (See Comments)    Difficult breathing and skin problem  . Ultram [Tramadol] Other (See Comments)    Difficult breathing and skin peeling    HOME MEDICATIONS: Outpatient Prescriptions Prior to Visit  Medication Sig Dispense Refill  . amLODipine (NORVASC)  5 MG tablet Take 5 mg by mouth daily.      Marland Kitchen atenolol (TENORMIN) 50 MG tablet Take 50 mg by mouth daily.      . baclofen (LIORESAL) 10 MG tablet One tablet in the morning and midday, two tablets at night  120 tablet  5  . cholecalciferol (VITAMIN D) 1000 UNITS tablet Take 1,000 Units by mouth daily.      . Dimethyl Fumarate (TECFIDERA) 240 MG CPDR Take 240 mg by mouth 2 (two) times daily.      . DULoxetine (CYMBALTA) 30 MG capsule Take 30 mg by mouth daily.      Marland Kitchen  DULoxetine (CYMBALTA) 60 MG capsule Take 60 mg by mouth daily.      Marland Kitchen gabapentin (NEURONTIN) 300 MG capsule Take 300 mg by mouth 3 (three) times daily.      Marland Kitchen glimepiride (AMARYL) 2 MG tablet Take 2 mg by mouth 2 (two) times daily.      . insulin glargine (LANTUS) 100 UNIT/ML injection Inject 10 Units into the skin at bedtime.       . metFORMIN (GLUCOPHAGE) 1000 MG tablet Take 1,000 mg by mouth 2 (two) times daily with a meal.      . naproxen sodium (ANAPROX) 220 MG tablet Take 220 mg by mouth 3 (three) times daily as needed.      Marland Kitchen omeprazole (PRILOSEC) 20 MG capsule Take 20 mg by mouth daily.      Marland Kitchen oxybutynin (DITROPAN) 5 MG tablet Take 5 mg by mouth 2 (two) times daily.      . predniSONE (DELTASONE) 5 MG tablet 1 tablet on odd days, one half tablet on even days      . valsartan (DIOVAN) 320 MG tablet Take 320 mg by mouth daily.      . Canagliflozin (INVOKANA) 100 MG TABS Take 100 mg by mouth daily.      . pravastatin (PRAVACHOL) 40 MG tablet Take 40 mg by mouth daily.      . benzonatate (TESSALON) 100 MG capsule Take 1 capsule (100 mg total) by mouth 3 (three) times daily as needed for cough.  20 capsule  1  . valsartan (DIOVAN) 320 MG tablet Take 320 mg by mouth daily.       No facility-administered medications prior to visit.    PAST MEDICAL HISTORY: Past Medical History  Diagnosis Date  . Hypertension   . Multiple sclerosis   . Diabetes mellitus without complication   . Neuromuscular disorder     Bilateral hand carpal  tunnel syndrome  . Gait disturbance   . Obesity   . Spinal stenosis in cervical region   . Spinal stenosis of lumbosacral region   . Spinal stenosis, thoracic   . Degenerative arthritis     PAST SURGICAL HISTORY: Past Surgical History  Procedure Laterality Date  . Replacement total knee bilateral  2004  . Abdominal hysterectomy    . Cholecystectomy    . Back surgery    . Left hand carpal tunnel surgery    . Cataract extraction Right     FAMILY HISTORY: Family History  Problem Relation Age of Onset  . Multiple sclerosis Other     neices.   . Cancer Mother   . Diabetes Mother   . GI Bleed Sister     diverticulitis    SOCIAL HISTORY: History   Social History  . Marital Status: Widowed    Spouse Name: N/A    Number of Children: 2  . Years of Education: 40yr colleg   Occupational History  . NIGHT RESIDENT [employer]    Social History Main Topics  . Smoking status: Never Smoker   . Smokeless tobacco: Never Used  . Alcohol Use: No  . Drug Use: No  . Sexual Activity: No   Other Topics Concern  . Not on file   Social History Narrative   Patient is widowed with 2 children   Patient is right handed   Patient has 2 yrs of college   Patient drinks 2 cups of tea daily      PHYSICAL EXAM  Filed Vitals:   01/05/14 1334  BP: 103/67  Pulse: 82   Cannot calculate BMI with a height equal to zero.  Generalized: Well developed, in no acute distress   Neurological examination  Mentation: Alert oriented to time, place, history taking. Follows all commands speech and language fluent Cranial nerve II-XII:  Extraocular movements were full, visual field were full on confrontational test.  Motor: The motor testing reveals 5 over 5 strength in the lower extremities. Patient has very little movements in the legs. Good symmetric motor tone is noted throughout.  Sensory: Sensory testing is intact to soft touch on all 4 extremities. No evidence of extinction is noted.    Coordination: Cerebellar testing reveals good finger-nose-finger and heel-to-shin bilaterally.  Gait and station: patient is unable to ambulate. In a motorized wheelchair.  Reflexes: Deep tendon reflexes are symmetric and normal bilaterally.   DIAGNOSTIC DATA (LABS, IMAGING, TESTING) - I reviewed patient records, labs, notes, testing and imaging myself where available.  Lab Results  Component Value Date   WBC 7.9 07/07/2013   HGB 13.1 07/07/2013   HCT 39.9 07/07/2013   MCV 87 07/07/2013   PLT 330 07/07/2013      Component Value Date/Time   NA 140 07/07/2013 1611   NA 136 08/04/2012 1558   K 4.9 07/07/2013 1611   CL 99 07/07/2013 1611   CO2 25 07/07/2013 1611   GLUCOSE 131* 07/07/2013 1611   GLUCOSE 239* 08/04/2012 1558   BUN 26 07/07/2013 1611   BUN 17 08/04/2012 1558   CREATININE 1.08* 07/07/2013 1611   CALCIUM 9.8 07/07/2013 1611   PROT 6.1 07/07/2013 1611   AST 8 07/07/2013 1611   ALT 12 07/07/2013 1611   ALKPHOS 77 07/07/2013 1611   BILITOT 0.3 07/07/2013 1611   GFRNONAA 52* 07/07/2013 1611   GFRAA 60 07/07/2013 1611    Lab Results  Component Value Date   HGBA1C 7.6* 07/21/2012   Lab Results  Component Value Date   TSH 0.102* 07/21/2012    ASSESSMENT AND PLAN 73 y.o. year old female  has a past medical history of Hypertension; Multiple sclerosis; Diabetes mellitus without complication; Neuromuscular disorder; Gait disturbance; Obesity; Spinal stenosis in cervical region; Spinal stenosis of lumbosacral region; Spinal stenosis, thoracic; and Degenerative arthritis. here with:  1. multiple sclerosis 2. abnormality of gait 3. encounter for therapeutic drug monitoring  Overall patient is doing better. She continues to take Tecfidera and is tolerating it well. Baclofen dose was increased at the last visit and she states that the increase has been very beneficial. Patient participates in the [facility]. She currently receives physical and occupational therapy 3 times a week. I will check blood work today.  Patient should followup in 6 months or sooner if needed.  Ward Givens, MSN, NP-C 01/05/2014, 1:44 PM [facility] [address] [place], [place] [address] [phone]  Note: This document was prepared with digital dictation and possible smart phrase technology. Any transcriptional errors that result from this process are unintentional.

## 2014-01-05 NOTE — Patient Instructions (Signed)

## 2014-01-05 NOTE — Progress Notes (Signed)
I have read the note, and I agree with the clinical assessment and plan.  Laura Roberts KEITH   

## 2014-01-06 ENCOUNTER — Telehealth: Payer: Self-pay | Admitting: Adult Health

## 2014-01-06 LAB — CBC WITH DIFFERENTIAL
Basophils Absolute: 0 10*3/uL (ref 0.0–0.2)
Basos: 0 %
Eos: 1 %
Eosinophils Absolute: 0.1 10*3/uL (ref 0.0–0.4)
HCT: 37.3 % (ref 34.0–46.6)
HEMOGLOBIN: 12.7 g/dL (ref 11.1–15.9)
LYMPHS: 8 %
Lymphocytes Absolute: 0.8 10*3/uL (ref 0.7–3.1)
MCH: 29.7 pg (ref 26.6–33.0)
MCHC: 34 g/dL (ref 31.5–35.7)
MCV: 87 fL (ref 79–97)
MONOCYTES: 5 %
MONOS ABS: 0.5 10*3/uL (ref 0.1–0.9)
Neutrophils Absolute: 8.3 10*3/uL — ABNORMAL HIGH (ref 1.4–7.0)
Neutrophils Relative %: 84 %
Platelets: 305 10*3/uL (ref 150–379)
RBC: 4.27 x10E6/uL (ref 3.77–5.28)
RDW: 14.1 % (ref 12.3–15.4)
WBC: 9.8 10*3/uL (ref 3.4–10.8)

## 2014-01-06 LAB — IMMATURE CELLS
BANDS PCT: 1 %
METAMYELOCYTES: 1 % — AB (ref 0–0)

## 2014-01-06 LAB — COMPREHENSIVE METABOLIC PANEL
ALT: 11 IU/L (ref 0–32)
AST: 13 IU/L (ref 0–40)
Albumin/Globulin Ratio: 1.7 (ref 1.1–2.5)
Albumin: 3.9 g/dL (ref 3.5–4.8)
Alkaline Phosphatase: 71 IU/L (ref 39–117)
BUN/Creatinine Ratio: 24 (ref 11–26)
BUN: 19 mg/dL (ref 8–27)
CALCIUM: 9.8 mg/dL (ref 8.7–10.3)
CO2: 28 mmol/L (ref 18–29)
Chloride: 102 mmol/L (ref 96–108)
Creatinine, Ser: 0.8 mg/dL (ref 0.57–1.00)
GFR calc Af Amer: 86 mL/min/{1.73_m2} (ref >59)
GFR calc non Af Amer: 74 mL/min/{1.73_m2} (ref >59)
GLUCOSE: 170 mg/dL — AB (ref 65–99)
Globulin, Total: 2.3 g/dL (ref 1.5–4.5)
POTASSIUM: 5.5 mmol/L — AB (ref 3.5–5.2)
Sodium: 139 mmol/L (ref 134–144)
Total Bilirubin: 0.4 mg/dL (ref 0.0–1.2)
Total Protein: 6.2 g/dL (ref 6.0–8.5)

## 2014-01-06 NOTE — Telephone Encounter (Signed)
I called the patient went over her lab work. Her potassium level was slightly elevated patient does confirm that she consumes Green leafy vegetables on a daily basis. I advised the patient to cut back on these vegetables. The patient also had metamylocytes show up in her blood work. I explained this could be an early indication of infection but most likely is not clinically significant at this time. We'll continue to monitor this. We will recheck lab work in 6 months. I have faxed her lab work and my last note to the physician at the pace program. Patient verbalized understanding.

## 2014-01-20 ENCOUNTER — Other Ambulatory Visit (HOSPITAL_COMMUNITY): Payer: Self-pay | Admitting: *Deleted

## 2014-01-20 ENCOUNTER — Encounter (HOSPITAL_COMMUNITY)
Admission: RE | Admit: 2014-01-20 | Discharge: 2014-01-20 | Disposition: A | Discharge status: Home / Self Care | Payer: Medicare (Managed Care) | Source: Ambulatory Visit | Attending: Family Medicine | Admitting: Family Medicine

## 2014-01-20 ENCOUNTER — Encounter (HOSPITAL_COMMUNITY): Payer: Self-pay

## 2014-01-20 ENCOUNTER — Telehealth: Payer: Self-pay | Admitting: Neurology

## 2014-01-20 DIAGNOSIS — G35 Multiple sclerosis: Secondary | ICD-10-CM | POA: Insufficient documentation

## 2014-01-20 MED ORDER — SODIUM CHLORIDE 0.9 % IV SOLN
Freq: Every day | INTRAVENOUS | Status: DC
  Administered 2014-01-20: 10:00:00 via INTRAVENOUS

## 2014-01-20 MED ORDER — METHYLPREDNISOLONE SODIUM SUCC 125 MG IJ SOLR
500.0000 mg | Freq: Every day | INTRAMUSCULAR | Status: DC
  Filled 2014-01-20 (×2): qty 8

## 2014-01-20 MED ORDER — SODIUM CHLORIDE 0.9 % IV SOLN
Freq: Once | INTRAVENOUS | Status: AC
  Administered 2014-01-20: 10:00:00 via INTRAVENOUS
  Filled 2014-01-20: qty 100

## 2014-01-20 MED ORDER — SODIUM CHLORIDE 0.9 % IV SOLN
Freq: Every day | INTRAVENOUS | Status: DC
  Filled 2014-01-20: qty 4

## 2014-01-20 MED ORDER — METHYLPREDNISOLONE SODIUM SUCC 125 MG IJ SOLR
INTRAMUSCULAR | Status: DC
  Filled 2014-01-20 (×47): qty 100

## 2014-01-20 NOTE — Telephone Encounter (Signed)
This patient has a history of multiple sclerosis, has had a recent exacerbation of back pain, increased weakness of the legs. This is her usual presentation. The patient is to receive five-day course of Solu-Medrol. She usually responds to this. Given the fact that she is on Tecfidera, we will check MRI evaluation of the brain with and without gadolinium, this will be ordered through her primary care physician office. Tecfidera may be associated with PML infections.

## 2014-01-20 NOTE — Discharge Instructions (Signed)
Methylprednisolone Solution for Injection °What is this medicine? °METHYLPREDNISOLONE (meth ill pred NISS oh lone) is a corticosteroid. It is commonly used to treat inflammation of the skin, joints, lungs, and other organs. Common conditions treated include asthma, allergies, and arthritis. It is also used for other conditions, such as blood disorders and diseases of the adrenal glands. °This medicine may be used for other purposes; ask your health care provider or pharmacist if you have questions. °COMMON BRAND NAME(S): A-Methapred, Solu-Medrol °What should I tell my health care provider before I take this medicine? °They need to know if you have any of these conditions: °-cataracts or glaucoma °-Cushing's syndrome °-heart disease °-high blood pressure °-infection including tuberculosis °-low calcium or potassium levels in the blood °-recent surgery °-seizures °-stomach or intestinal disease, including colitis °-threadworms °-thyroid problems °-an unusual or allergic reaction to methylprednisolone, corticosteroids, benzyl alcohol, other medicines, foods, dyes, or preservatives °-pregnant or trying to get pregnant °-breast-feeding °How should I use this medicine? °This medicine is for injection or infusion into a vein. It is also for injection into a muscle. It is given by a health care professional in a hospital or clinic setting. °Talk to your pediatrician regarding the use of this medicine in children. While this drug may be prescribed for selected conditions, precautions do apply. °Overdosage: If you think you have taken too much of this medicine contact a poison control center or emergency room at once. °NOTE: This medicine is only for you. Do not share this medicine with others. °What if I miss a dose? °This does not apply. °What may interact with this medicine? °Do not take this medicine with any of the following medications: °-mifepristone °This medicine may also interact with the following  medications: °-aspirin and aspirin-like medicines °-cyclosporin °-ketoconazole °-phenobarbital °-phenytoin °-rifampin °-tacrolimus °-troleandomycin °-vaccines °-warfarin °This list may not describe all possible interactions. Give your health care provider a list of all the medicines, herbs, non-prescription drugs, or dietary supplements you use. Also tell them if you smoke, drink alcohol, or use illegal drugs. Some items may interact with your medicine. °What should I watch for while using this medicine? °Visit your doctor or health care professional for regular checks on your progress. If you are taking this medicine for a long time, carry an identification card with your name and address, the type and dose of your medicine, and your doctor's name and address. °The medicine may increase your risk of getting an infection. Stay away from people who are sick. Tell your doctor or health care professional if you are around anyone with measles or chickenpox. °You may need to avoid some vaccines. Talk to your health care provider for more information. °If you are going to have surgery, tell your doctor or health care professional that you have taken this medicine within the last twelve months. °Ask your doctor or health care professional about your diet. You may need to lower the amount of salt you eat. °The medicine can increase your blood sugar. If you are a diabetic check with your doctor if you need help adjusting the dose of your diabetic medicine. °What side effects may I notice from receiving this medicine? °Side effects that you should report to your doctor or health care professional as soon as possible: °-allergic reactions like skin rash, itching or hives, swelling of the face, lips, or tongue °-bloody or tarry stools °-changes in vision °-eye pain or bulging eyes °-fever, sore throat, sneezing, cough, or other signs of infection, wounds that will   not heal °-increased thirst °-irregular heartbeat °-muscle  cramps °-pain in hips, back, ribs, arms, shoulders, or legs °-swelling of the ankles, feet, hands °-trouble passing urine or change in the amount of urine °-unusual bleeding or bruising °-unusually weak or tired °-weight gain or weight loss °Side effects that usually do not require medical attention (report to your doctor or health care professional if they continue or are bothersome): °-changes in emotions or moods °-constipation or diarrhea °-headache °-irritation at site where injected °-nausea, vomiting °-skin problems, acne, thin and shiny skin °-trouble sleeping °-unusual hair growth on the face or body °This list may not describe all possible side effects. Call your doctor for medical advice about side effects. You may report side effects to FDA at 1-800-FDA-1088. °Where should I keep my medicine? °This drug is given in a hospital or clinic and will not be stored at home. °NOTE: This sheet is a summary. It may not cover all possible information. If you have questions about this medicine, talk to your doctor, pharmacist, or health care provider. °© 2015, [employer]vier/Gold Standard. (2012-03-17 11:37:16) ° ° °

## 2014-01-21 ENCOUNTER — Encounter (HOSPITAL_COMMUNITY): Payer: Self-pay

## 2014-01-21 ENCOUNTER — Encounter (HOSPITAL_COMMUNITY)
Admission: RE | Admit: 2014-01-21 | Discharge: 2014-01-21 | Disposition: A | Discharge status: Home / Self Care | Payer: Medicare (Managed Care) | Source: Ambulatory Visit | Attending: Family Medicine | Admitting: Family Medicine

## 2014-01-21 DIAGNOSIS — G35 Multiple sclerosis: Secondary | ICD-10-CM | POA: Diagnosis not present

## 2014-01-21 MED ORDER — SODIUM CHLORIDE 0.9 % IV SOLN: Freq: Every day | INTRAVENOUS | Status: DC

## 2014-01-21 MED ORDER — SODIUM CHLORIDE 0.9 % IV SOLN
Freq: Once | INTRAVENOUS | Status: AC
  Administered 2014-01-21: 09:00:00 via INTRAVENOUS

## 2014-01-21 MED ORDER — SODIUM CHLORIDE 0.9 % IV SOLN
Freq: Once | INTRAVENOUS | Status: AC
  Administered 2014-01-21: 09:00:00 via INTRAVENOUS
  Filled 2014-01-21: qty 100

## 2014-01-21 NOTE — Discharge Instructions (Signed)
Methylprednisolone Solution for Injection °What is this medicine? °METHYLPREDNISOLONE (meth ill pred NISS oh lone) is a corticosteroid. It is commonly used to treat inflammation of the skin, joints, lungs, and other organs. Common conditions treated include asthma, allergies, and arthritis. It is also used for other conditions, such as blood disorders and diseases of the adrenal glands. °This medicine may be used for other purposes; ask your health care provider or pharmacist if you have questions. °COMMON BRAND NAME(S): A-Methapred, Solu-Medrol °What should I tell my health care provider before I take this medicine? °They need to know if you have any of these conditions: °-cataracts or glaucoma °-Cushing's syndrome °-heart disease °-high blood pressure °-infection including tuberculosis °-low calcium or potassium levels in the blood °-recent surgery °-seizures °-stomach or intestinal disease, including colitis °-threadworms °-thyroid problems °-an unusual or allergic reaction to methylprednisolone, corticosteroids, benzyl alcohol, other medicines, foods, dyes, or preservatives °-pregnant or trying to get pregnant °-breast-feeding °How should I use this medicine? °This medicine is for injection or infusion into a vein. It is also for injection into a muscle. It is given by a health care professional in a hospital or clinic setting. °Talk to your pediatrician regarding the use of this medicine in children. While this drug may be prescribed for selected conditions, precautions do apply. °Overdosage: If you think you have taken too much of this medicine contact a poison control center or emergency room at once. °NOTE: This medicine is only for you. Do not share this medicine with others. °What if I miss a dose? °This does not apply. °What may interact with this medicine? °Do not take this medicine with any of the following medications: °-mifepristone °This medicine may also interact with the following  medications: °-aspirin and aspirin-like medicines °-cyclosporin °-ketoconazole °-phenobarbital °-phenytoin °-rifampin °-tacrolimus °-troleandomycin °-vaccines °-warfarin °This list may not describe all possible interactions. Give your health care provider a list of all the medicines, herbs, non-prescription drugs, or dietary supplements you use. Also tell them if you smoke, drink alcohol, or use illegal drugs. Some items may interact with your medicine. °What should I watch for while using this medicine? °Visit your doctor or health care professional for regular checks on your progress. If you are taking this medicine for a long time, carry an identification card with your name and address, the type and dose of your medicine, and your doctor's name and address. °The medicine may increase your risk of getting an infection. Stay away from people who are sick. Tell your doctor or health care professional if you are around anyone with measles or chickenpox. °You may need to avoid some vaccines. Talk to your health care provider for more information. °If you are going to have surgery, tell your doctor or health care professional that you have taken this medicine within the last twelve months. °Ask your doctor or health care professional about your diet. You may need to lower the amount of salt you eat. °The medicine can increase your blood sugar. If you are a diabetic check with your doctor if you need help adjusting the dose of your diabetic medicine. °What side effects may I notice from receiving this medicine? °Side effects that you should report to your doctor or health care professional as soon as possible: °-allergic reactions like skin rash, itching or hives, swelling of the face, lips, or tongue °-bloody or tarry stools °-changes in vision °-eye pain or bulging eyes °-fever, sore throat, sneezing, cough, or other signs of infection, wounds that will   not heal °-increased thirst °-irregular heartbeat °-muscle  cramps °-pain in hips, back, ribs, arms, shoulders, or legs °-swelling of the ankles, feet, hands °-trouble passing urine or change in the amount of urine °-unusual bleeding or bruising °-unusually weak or tired °-weight gain or weight loss °Side effects that usually do not require medical attention (report to your doctor or health care professional if they continue or are bothersome): °-changes in emotions or moods °-constipation or diarrhea °-headache °-irritation at site where injected °-nausea, vomiting °-skin problems, acne, thin and shiny skin °-trouble sleeping °-unusual hair growth on the face or body °This list may not describe all possible side effects. Call your doctor for medical advice about side effects. You may report side effects to FDA at 1-800-FDA-1088. °Where should I keep my medicine? °This drug is given in a hospital or clinic and will not be stored at home. °NOTE: This sheet is a summary. It may not cover all possible information. If you have questions about this medicine, talk to your doctor, pharmacist, or health care provider. °© 2015, [employer]vier/Gold Standard. (2012-03-17 11:37:16) ° ° °

## 2014-01-22 ENCOUNTER — Observation Stay (HOSPITAL_COMMUNITY)
Admission: RE | Admit: 2014-01-22 | Discharge: 2014-01-22 | Disposition: A | Discharge status: Home / Self Care | Payer: Medicare (Managed Care) | Source: Ambulatory Visit | Attending: Family Medicine | Admitting: Family Medicine

## 2014-01-22 DIAGNOSIS — G8220 Paraplegia, unspecified: Secondary | ICD-10-CM | POA: Insufficient documentation

## 2014-01-22 DIAGNOSIS — N319 Neuromuscular dysfunction of bladder, unspecified: Secondary | ICD-10-CM | POA: Diagnosis not present

## 2014-01-22 DIAGNOSIS — G35 Multiple sclerosis: Secondary | ICD-10-CM | POA: Diagnosis not present

## 2014-01-22 MED ORDER — SODIUM CHLORIDE 0.9 % IV SOLN
Freq: Once | INTRAVENOUS | Status: AC
  Administered 2014-01-22: 250 mL via INTRAVENOUS

## 2014-01-22 MED ORDER — SODIUM CHLORIDE 0.9 % IV SOLN
Freq: Once | INTRAVENOUS | Status: AC
  Administered 2014-01-22: 11:00:00 via INTRAVENOUS
  Filled 2014-01-22: qty 100

## 2014-01-22 NOTE — Progress Notes (Signed)
[place] 1207 D/C HOME PAST RECEIVING SOLUMEDROL. SW IN PLACE, FLUSHED, PER [facility]. PATIENT A/O, RESP. EASY, UNLABORED. Marland Kitchen

## 2014-01-23 ENCOUNTER — Ambulatory Visit (HOSPITAL_COMMUNITY): Payer: Self-pay

## 2014-01-23 NOTE — Progress Notes (Addendum)
CARE MANAGEMENT NOTE 01/23/2014  Patient:  Laura Roberts, Laura Roberts   Account Number:  [id number]  Date Initiated:  01/22/2014  Documentation initiated by:  [facility]  Subjective/Objective Assessment:   MS     Action/Plan:   Anticipated DC Date:  01/22/2014   Anticipated DC Plan:  [place]  CM consult      Choice offered to / List presented to:             Status of service:  Completed, signed off Medicare Important Message given?  NO (If response is "NO", the following Medicare IM given date fields will be blank) Date Medicare IM given:   Medicare IM given by:   Date Additional Medicare IM given:   Additional Medicare IM given by:    Discharge Disposition:  [place]  Per UR Regulation:    If discussed at Long Length of Stay Meetings, dates discussed:    Comments:  Care will resume with [facility]. Jonnie Finner RN CCM Case Mgmt phone [phone]

## 2014-01-25 ENCOUNTER — Other Ambulatory Visit: Payer: Self-pay | Admitting: Family Medicine

## 2014-01-25 DIAGNOSIS — G35 Multiple sclerosis: Secondary | ICD-10-CM

## 2014-01-28 ENCOUNTER — Encounter: Payer: Self-pay | Admitting: Adult Health

## 2014-02-02 ENCOUNTER — Ambulatory Visit
Admission: RE | Admit: 2014-02-02 | Discharge: 2014-02-02 | Disposition: A | Discharge status: Home / Self Care | Payer: Medicare (Managed Care) | Source: Ambulatory Visit | Attending: Family Medicine | Admitting: Family Medicine

## 2014-02-02 DIAGNOSIS — G35 Multiple sclerosis: Secondary | ICD-10-CM

## 2014-02-02 MED ORDER — GADOBENATE DIMEGLUMINE 529 MG/ML IV SOLN
17.0000 mL | Freq: Once | INTRAVENOUS | Status: AC | PRN
  Administered 2014-02-02: 17 mL via INTRAVENOUS

## 2014-05-18 ENCOUNTER — Encounter: Payer: Self-pay | Admitting: Neurology

## 2014-05-24 ENCOUNTER — Encounter: Payer: Self-pay | Admitting: Neurology

## 2014-06-01 ENCOUNTER — Encounter (HOSPITAL_COMMUNITY): Payer: Self-pay | Admitting: Adult Health

## 2014-06-01 ENCOUNTER — Ambulatory Visit (HOSPITAL_COMMUNITY)
Admission: RE | Admit: 2014-06-01 | Discharge: 2014-06-01 | Disposition: A | Discharge status: Home / Self Care | Payer: Medicare (Managed Care) | Source: Ambulatory Visit | Attending: *Deleted | Admitting: *Deleted

## 2014-06-01 ENCOUNTER — Other Ambulatory Visit (HOSPITAL_COMMUNITY): Payer: Self-pay | Admitting: *Deleted

## 2014-06-01 ENCOUNTER — Other Ambulatory Visit (HOSPITAL_COMMUNITY): Payer: Self-pay

## 2014-06-01 ENCOUNTER — Inpatient Hospital Stay (HOSPITAL_COMMUNITY)
Admission: EM | Admit: 2014-06-01 | Discharge: 2014-06-06 | DRG: 481 | Disposition: A | Discharge status: Skilled Nursing Facility | Payer: Medicare (Managed Care) | Attending: Internal Medicine | Admitting: Internal Medicine

## 2014-06-01 ENCOUNTER — Emergency Department (HOSPITAL_COMMUNITY): Payer: Medicare (Managed Care)

## 2014-06-01 ENCOUNTER — Other Ambulatory Visit: Payer: Self-pay

## 2014-06-01 DIAGNOSIS — E669 Obesity, unspecified: Secondary | ICD-10-CM | POA: Diagnosis present

## 2014-06-01 DIAGNOSIS — Z419 Encounter for procedure for purposes other than remedying health state, unspecified: Secondary | ICD-10-CM

## 2014-06-01 DIAGNOSIS — Z6831 Body mass index (BMI) 31.0-31.9, adult: Secondary | ICD-10-CM

## 2014-06-01 DIAGNOSIS — S72451A Displaced supracondylar fracture without intracondylar extension of lower end of right femur, initial encounter for closed fracture: Principal | ICD-10-CM | POA: Diagnosis present

## 2014-06-01 DIAGNOSIS — Z90710 Acquired absence of both cervix and uterus: Secondary | ICD-10-CM

## 2014-06-01 DIAGNOSIS — I10 Essential (primary) hypertension: Secondary | ICD-10-CM | POA: Diagnosis present

## 2014-06-01 DIAGNOSIS — E119 Type 2 diabetes mellitus without complications: Secondary | ICD-10-CM | POA: Diagnosis present

## 2014-06-01 DIAGNOSIS — W1811XA Fall from or off toilet without subsequent striking against object, initial encounter: Secondary | ICD-10-CM | POA: Diagnosis present

## 2014-06-01 DIAGNOSIS — I959 Hypotension, unspecified: Secondary | ICD-10-CM | POA: Diagnosis not present

## 2014-06-01 DIAGNOSIS — G8220 Paraplegia, unspecified: Secondary | ICD-10-CM | POA: Diagnosis present

## 2014-06-01 DIAGNOSIS — Z993 Dependence on wheelchair: Secondary | ICD-10-CM

## 2014-06-01 DIAGNOSIS — T84042A Periprosthetic fracture around internal prosthetic right knee joint, initial encounter: Secondary | ICD-10-CM | POA: Diagnosis present

## 2014-06-01 DIAGNOSIS — Z9841 Cataract extraction status, right eye: Secondary | ICD-10-CM | POA: Diagnosis not present

## 2014-06-01 DIAGNOSIS — G35 Multiple sclerosis: Secondary | ICD-10-CM | POA: Diagnosis present

## 2014-06-01 DIAGNOSIS — M1990 Unspecified osteoarthritis, unspecified site: Secondary | ICD-10-CM | POA: Diagnosis present

## 2014-06-01 DIAGNOSIS — S7291XA Unspecified fracture of right femur, initial encounter for closed fracture: Secondary | ICD-10-CM

## 2014-06-01 DIAGNOSIS — S72453A Displaced supracondylar fracture without intracondylar extension of lower end of unspecified femur, initial encounter for closed fracture: Secondary | ICD-10-CM

## 2014-06-01 DIAGNOSIS — Y92002 Bathroom of unspecified non-institutional (private) residence single-family (private) house as the place of occurrence of the external cause: Secondary | ICD-10-CM

## 2014-06-01 DIAGNOSIS — E872 Acidosis: Secondary | ICD-10-CM | POA: Diagnosis present

## 2014-06-01 DIAGNOSIS — R52 Pain, unspecified: Secondary | ICD-10-CM

## 2014-06-01 DIAGNOSIS — Z96653 Presence of artificial knee joint, bilateral: Secondary | ICD-10-CM | POA: Diagnosis present

## 2014-06-01 DIAGNOSIS — S7290XA Unspecified fracture of unspecified femur, initial encounter for closed fracture: Secondary | ICD-10-CM | POA: Diagnosis present

## 2014-06-01 DIAGNOSIS — Z01818 Encounter for other preprocedural examination: Secondary | ICD-10-CM

## 2014-06-01 DIAGNOSIS — M85861 Other specified disorders of bone density and structure, right lower leg: Secondary | ICD-10-CM | POA: Diagnosis present

## 2014-06-01 DIAGNOSIS — M79661 Pain in right lower leg: Secondary | ICD-10-CM | POA: Diagnosis present

## 2014-06-01 HISTORY — DX: Displaced supracondylar fracture without intracondylar extension of lower end of right femur, initial encounter for closed fracture: S72.451A

## 2014-06-01 LAB — CBC WITH DIFFERENTIAL/PLATELET
BASOS PCT: 0 % (ref 0–1)
Basophils Absolute: 0 10*3/uL (ref 0.0–0.1)
EOS ABS: 0 10*3/uL (ref 0.0–0.7)
Eosinophils Relative: 0 % (ref 0–5)
HCT: 36.7 % (ref 36.0–46.0)
Hemoglobin: 11.9 g/dL — ABNORMAL LOW (ref 12.0–15.0)
Lymphocytes Relative: 5 % — ABNORMAL LOW (ref 12–46)
Lymphs Abs: 0.7 10*3/uL (ref 0.7–4.0)
MCH: 29 pg (ref 26.0–34.0)
MCHC: 32.4 g/dL (ref 30.0–36.0)
MCV: 89.5 fL (ref 78.0–100.0)
Monocytes Absolute: 0.9 10*3/uL (ref 0.1–1.0)
Monocytes Relative: 7 % (ref 3–12)
NEUTROS ABS: 11.8 10*3/uL — AB (ref 1.7–7.7)
Neutrophils Relative %: 88 % — ABNORMAL HIGH (ref 43–77)
PLATELETS: 237 10*3/uL (ref 150–400)
RBC: 4.1 MIL/uL (ref 3.87–5.11)
RDW: 14.4 % (ref 11.5–15.5)
WBC: 13.5 10*3/uL — ABNORMAL HIGH (ref 4.0–10.5)

## 2014-06-01 LAB — COMPREHENSIVE METABOLIC PANEL
ALBUMIN: 3.6 g/dL (ref 3.5–5.2)
ALK PHOS: 62 U/L (ref 39–117)
ALT: 15 U/L (ref 0–35)
AST: 18 U/L (ref 0–37)
Anion gap: 23 — ABNORMAL HIGH (ref 5–15)
BUN: 16 mg/dL (ref 6–23)
CHLORIDE: 94 meq/L — AB (ref 96–112)
CO2: 21 mEq/L (ref 19–32)
Calcium: 9.9 mg/dL (ref 8.4–10.5)
Creatinine, Ser: 0.58 mg/dL (ref 0.50–1.10)
GFR calc Af Amer: >90 mL/min (ref >90)
GFR calc non Af Amer: >90 mL/min (ref >90)
GLUCOSE: 269 mg/dL — AB (ref 70–99)
Potassium: 4.7 mEq/L (ref 3.7–5.3)
Sodium: 138 mEq/L (ref 137–147)
Total Bilirubin: 1.2 mg/dL (ref 0.3–1.2)
Total Protein: 6.8 g/dL (ref 6.0–8.3)

## 2014-06-01 LAB — ABO/RH: ABO/RH(D): O POS

## 2014-06-01 LAB — TYPE AND SCREEN
ABO/RH(D): O POS
Antibody Screen: NEGATIVE

## 2014-06-01 LAB — GLUCOSE, CAPILLARY: Glucose-Capillary: 245 mg/dL — ABNORMAL HIGH (ref 70–99)

## 2014-06-01 LAB — SALICYLATE LEVEL: Salicylate Lvl: <2 mg/dL — ABNORMAL LOW (ref 2.8–20.0)

## 2014-06-01 MED ORDER — ACETAMINOPHEN 325 MG PO TABS: 650.0000 mg | ORAL_TABLET | Freq: Four times a day (QID) | ORAL | Status: DC | PRN

## 2014-06-01 MED ORDER — HEPARIN SODIUM (PORCINE) 5000 UNIT/ML IJ SOLN
5000.0000 [IU] | Freq: Three times a day (TID) | INTRAMUSCULAR | Status: DC
  Administered 2014-06-01: 5000 [IU] via SUBCUTANEOUS
  Filled 2014-06-01: qty 1

## 2014-06-01 MED ORDER — BACLOFEN 20 MG PO TABS
20.0000 mg | ORAL_TABLET | Freq: Every day | ORAL | Status: DC
  Administered 2014-06-01 – 2014-06-05 (×5): 20 mg via ORAL
  Filled 2014-06-01 (×6): qty 1

## 2014-06-01 MED ORDER — OXYBUTYNIN CHLORIDE 5 MG PO TABS
5.0000 mg | ORAL_TABLET | Freq: Two times a day (BID) | ORAL | Status: DC
  Administered 2014-06-01 – 2014-06-06 (×9): 5 mg via ORAL
  Filled 2014-06-01 (×11): qty 1

## 2014-06-01 MED ORDER — AMLODIPINE BESYLATE 5 MG PO TABS
5.0000 mg | ORAL_TABLET | Freq: Every day | ORAL | Status: DC
Start: 2014-06-01 — End: 2014-06-03
  Administered 2014-06-01 – 2014-06-02 (×2): 5 mg via ORAL
  Filled 2014-06-01 (×3): qty 1

## 2014-06-01 MED ORDER — PREDNISONE 5 MG PO TABS
5.0000 mg | ORAL_TABLET | ORAL | Status: DC
  Filled 2014-06-01: qty 1

## 2014-06-01 MED ORDER — DIMETHYL FUMARATE 240 MG PO CPDR
240.0000 mg | DELAYED_RELEASE_CAPSULE | Freq: Two times a day (BID) | ORAL | Status: DC
  Administered 2014-06-01 – 2014-06-06 (×9): 240 mg via ORAL
  Filled 2014-06-01: qty 1

## 2014-06-01 MED ORDER — PREDNISONE 2.5 MG PO TABS
2.5000 mg | ORAL_TABLET | ORAL | Status: DC
  Filled 2014-06-01: qty 1

## 2014-06-01 MED ORDER — BACLOFEN 10 MG PO TABS
10.0000 mg | ORAL_TABLET | ORAL | Status: DC
  Administered 2014-06-03 – 2014-06-06 (×4): 10 mg via ORAL
  Filled 2014-06-01 (×5): qty 1

## 2014-06-01 MED ORDER — VITAMIN D3 25 MCG (1000 UNIT) PO TABS
1000.0000 [IU] | ORAL_TABLET | Freq: Every day | ORAL | Status: DC
  Administered 2014-06-02 – 2014-06-06 (×5): 1000 [IU] via ORAL
  Filled 2014-06-01 (×5): qty 1

## 2014-06-01 MED ORDER — ATENOLOL 50 MG PO TABS
50.0000 mg | ORAL_TABLET | Freq: Every day | ORAL | Status: DC
  Administered 2014-06-01 – 2014-06-02 (×2): 50 mg via ORAL
  Filled 2014-06-01 (×3): qty 1

## 2014-06-01 MED ORDER — IRBESARTAN 300 MG PO TABS
300.0000 mg | ORAL_TABLET | Freq: Every day | ORAL | Status: DC
  Administered 2014-06-01: 300 mg via ORAL
  Filled 2014-06-01 (×3): qty 1

## 2014-06-01 MED ORDER — BACLOFEN 10 MG PO TABS
10.0000 mg | ORAL_TABLET | ORAL | Status: DC
  Administered 2014-06-02 – 2014-06-06 (×4): 10 mg via ORAL
  Filled 2014-06-01 (×7): qty 1

## 2014-06-01 MED ORDER — INSULIN ASPART 100 UNIT/ML ~~LOC~~ SOLN
0.0000 [IU] | SUBCUTANEOUS | Status: DC
  Administered 2014-06-01 – 2014-06-02 (×3): 3 [IU] via SUBCUTANEOUS
  Administered 2014-06-03 – 2014-06-04 (×5): 2 [IU] via SUBCUTANEOUS
  Administered 2014-06-04: 1 [IU] via SUBCUTANEOUS
  Administered 2014-06-05 (×2): 2 [IU] via SUBCUTANEOUS
  Administered 2014-06-05: 3 [IU] via SUBCUTANEOUS

## 2014-06-01 MED ORDER — PREDNISONE 2.5 MG PO TABS: 2.5000 mg | ORAL_TABLET | Freq: Every day | ORAL | Status: DC

## 2014-06-01 MED ORDER — INSULIN GLARGINE 100 UNIT/ML ~~LOC~~ SOLN
5.0000 [IU] | Freq: Every day | SUBCUTANEOUS | Status: DC
  Administered 2014-06-01 – 2014-06-05 (×5): 5 [IU] via SUBCUTANEOUS
  Filled 2014-06-01 (×7): qty 0.05

## 2014-06-01 MED ORDER — DULOXETINE HCL 60 MG PO CPEP
90.0000 mg | ORAL_CAPSULE | Freq: Every day | ORAL | Status: DC
  Administered 2014-06-02 – 2014-06-06 (×5): 90 mg via ORAL
  Filled 2014-06-01 (×5): qty 1

## 2014-06-01 MED ORDER — BACLOFEN 10 MG PO TABS: 10.0000 mg | ORAL_TABLET | ORAL | Status: DC

## 2014-06-01 MED ORDER — PANTOPRAZOLE SODIUM 40 MG PO TBEC
40.0000 mg | DELAYED_RELEASE_TABLET | Freq: Every day | ORAL | Status: DC
  Administered 2014-06-02 – 2014-06-06 (×5): 40 mg via ORAL
  Filled 2014-06-01 (×6): qty 1

## 2014-06-01 MED ORDER — SODIUM CHLORIDE 0.9 % IV SOLN
INTRAVENOUS | Status: DC
  Administered 2014-06-01 – 2014-06-03 (×2): via INTRAVENOUS

## 2014-06-01 MED ORDER — CEFAZOLIN SODIUM-DEXTROSE 2-3 GM-% IV SOLR
2.0000 g | INTRAVENOUS | Status: AC
  Administered 2014-06-02: 2 g via INTRAVENOUS

## 2014-06-01 MED ORDER — ENOXAPARIN SODIUM 40 MG/0.4ML ~~LOC~~ SOLN: 40.0000 mg | SUBCUTANEOUS | Status: DC

## 2014-06-01 MED ORDER — GABAPENTIN 300 MG PO CAPS
300.0000 mg | ORAL_CAPSULE | Freq: Three times a day (TID) | ORAL | Status: DC
  Administered 2014-06-01 – 2014-06-02 (×3): 300 mg via ORAL
  Filled 2014-06-01 (×7): qty 1

## 2014-06-01 NOTE — Consult Note (Signed)
ORTHOPAEDIC CONSULTATION  REQUESTING PHYSICIAN: Bartholomew Crews, MD  Chief Complaint: right leg pain  HPI: Laura Roberts is a 73 y.o. female who complains of  Right leg pain after a fall this past Monday, acute severe pain, unable to bear weight.  Normally wheelchair bound due to MS, but uses right leg to stand and pivot.  Denies any other injuries.  Last walked more than a year ago.  Rest and immobilization makes it better, movement makes it worse.  Past Medical History  Diagnosis Date  . Hypertension   . Multiple sclerosis   . Diabetes mellitus without complication   . Neuromuscular disorder     Bilateral hand carpal tunnel syndrome  . Gait disturbance   . Obesity   . Spinal stenosis in cervical region   . Spinal stenosis of lumbosacral region   . Spinal stenosis, thoracic   . Degenerative arthritis    Past Surgical History  Procedure Laterality Date  . Replacement total knee bilateral  2004  . Abdominal hysterectomy    . Cholecystectomy    . Back surgery    . Left hand carpal tunnel surgery    . Cataract extraction Right    History   Social History  . Marital Status: Widowed    Spouse Name: N/A    Number of Children: 2  . Years of Education: 25yr colleg   Occupational History  . NIGHT RESIDENT [employer]    Social History Main Topics  . Smoking status: Never Smoker   . Smokeless tobacco: Never Used  . Alcohol Use: No  . Drug Use: No  . Sexual Activity: No   Other Topics Concern  . None   Social History Narrative   Patient is widowed with 2 children   Patient is right handed   Patient has 2 yrs of college   Patient drinks 2 cups of tea daily   Family History  Problem Relation Age of Onset  . Multiple sclerosis Other     neices.   . Cancer Mother   . Diabetes Mother   . GI Bleed Sister     diverticulitis   Allergies  Allergen Reactions  . Codeine Other (See Comments)    Difficult breathing and skin problem  . Ultram [Tramadol] Other (See  Comments)    Difficult breathing and skin peeling   Prior to Admission medications   Medication Sig Start Date End Date Taking? Authorizing Provider  amLODipine (NORVASC) 5 MG tablet Take 5 mg by mouth at bedtime.    Yes Historical Provider, MD  atenolol (TENORMIN) 50 MG tablet Take 50 mg by mouth at bedtime.    Yes Historical Provider, MD  baclofen (LIORESAL) 10 MG tablet One tablet in the morning and midday, two tablets at night 07/07/13  Yes Kathrynn Ducking, MD  cholecalciferol (VITAMIN D) 1000 UNITS tablet Take 1,000 Units by mouth daily.   Yes Historical Provider, MD  Dimethyl Fumarate (TECFIDERA) 240 MG CPDR Take 240 mg by mouth 2 (two) times daily.   Yes Historical Provider, MD  DULoxetine (CYMBALTA) 30 MG capsule Take 30 mg by mouth daily. Take with 60mg  for a total of 90mg    Yes Historical Provider, MD  DULoxetine (CYMBALTA) 60 MG capsule Take 60 mg by mouth daily. Take with 30mg  for a total of 90mg    Yes Historical Provider, MD  gabapentin (NEURONTIN) 300 MG capsule Take 300 mg by mouth 3 (three) times daily.   Yes Historical Provider, MD  glimepiride (AMARYL)  2 MG tablet Take 2 mg by mouth 2 (two) times daily.   Yes Historical Provider, MD  insulin glargine (LANTUS) 100 UNIT/ML injection Inject 10 Units into the skin at bedtime.    Yes Historical Provider, MD  metFORMIN (GLUCOPHAGE) 1000 MG tablet Take 1,000 mg by mouth 2 (two) times daily with a meal.   Yes Historical Provider, MD  omeprazole (PRILOSEC) 20 MG capsule Take 20 mg by mouth daily.   Yes Historical Provider, MD  oxybutynin (DITROPAN) 5 MG tablet Take 5 mg by mouth 2 (two) times daily. 10/01/12  Yes Meredith Staggers, MD  predniSONE (DELTASONE) 5 MG tablet 1 tablet on odd days, one half tablet on even days 07/07/13  Yes Kathrynn Ducking, MD  valsartan (DIOVAN) 320 MG tablet Take 320 mg by mouth at bedtime.    Yes Historical Provider, MD  vitamin B-12 (CYANOCOBALAMIN) 100 MCG tablet Take 100 mcg by mouth daily.   Yes Historical  Provider, MD   Dg Chest Portable 1 View  06/01/2014   CLINICAL DATA:  Preop evaluation.  EXAM: PORTABLE CHEST - 1 VIEW  COMPARISON:  July 24, 2012.  FINDINGS: Stable cardiomediastinal silhouette. No pneumothorax or pleural effusion is noted. Both lungs are clear. The visualized skeletal structures are unremarkable.  IMPRESSION: No acute cardiopulmonary abnormality seen.   Electronically Signed   By: Sabino Dick M.D.   On: 06/01/2014 16:55   Dg Knee Complete 4 Views Right  06/01/2014   CLINICAL DATA:  Right knee pain in its entirety. Pt. Fell on right knee in bathroom at sons home on tile floor 3 days ago. Hx of right knee replacement in 2003.  EXAM: RIGHT KNEE - COMPLETE 4+ VIEW  COMPARISON:  None.  FINDINGS: Status post total knee arthroplasty. An oblique fracture of the distal femoral meta diaphysis with approximately 1/3 shaft with displacement of distal fracture fragment medially. Posttraumatic. Tibial component appears normal. Probable small suprapatellar joint effusion.  IMPRESSION: Oblique fracture of the distal femur, just cephalad to the femoral component of the total knee arthroplasty.  These results will be called to the ordering clinician or representative by the Radiologist Assistant, and communication documented in the PACS or zVision Dashboard.   Electronically Signed   By: Abigail Miyamoto M.D.   On: 06/01/2014 15:24    Positive ROS: All other systems have been reviewed and were otherwise negative with the exception of those mentioned in the HPI and as above.  Physical Exam: General: Alert, no acute distress Cardiovascular: No significant pedal edema, 2+ DP pulse on right foot Respiratory: No cyanosis, no use of accessory musculature GI: No organomegaly, abdomen is soft and non-tender Skin: No lesions in the area of chief complaint with the exception of bruising per report, no bleeding or open fracture Neurologic: Sensation intact distally in right foot, but severe motor  loss Psychiatric: Patient is competent for consent with normal mood and affect Lymphatic: No axillary or cervical lymphadenopathy  MUSCULOSKELETAL: right leg with severe pain to palpation, currently in knee immobilizer, unable to lift leg, no pain with prom of toes, but no active movement of toes or ankle, rests in significant equinus.  Assessment: Right periprosthetic femur fracture, severe MS  Plan: This is an acute severe and potentially life threatening injury.  She uses this leg to pivot and post with, and I have recommended surgical intervention to stabilize the bone, help with pain control and restore transfer function as quickly as possible.  The risks benefits and alternatives  were discussed with the patient including but not limited to the risks of nonoperative treatment, versus surgical intervention including infection, bleeding, nerve injury, malunion, nonunion, the need for revision surgery, hardware prominence, hardware failure, the need for hardware removal, blood clots, cardiopulmonary complications, morbidity, mortality, among others, and they were willing to proceed.    Plan for surgery tomorrow afternoon if optimized medically.    Johnny Bridge, MD Cell [phone]   06/01/2014 6:41 PM

## 2014-06-01 NOTE — Consult Note (Signed)
PHARMACY NOTE  Pharmacy Consult :  73 y.o. female is to be placed on SQ Heparin for VTE prophylaxis while awaiting surgery to repair  A R-periprosthetic femur fracture.   Dosing Wt :  77 kg  Hematology :  Recent Labs  06/01/14 1653  HGB 11.9*  HCT 36.7  PLT 237  CREATININE 0.58   Goal :  Adequate VTE prophylaxis prior to surgery   Plan : 1. Heparin 5000 units sq q 8 hours. 2. Orthopedic Surgery to clarify discontinuation of Heparin prior to surgery. 3. Pharmacy to sign off.  Please reconsult if additional assistance is needed.  Thank you.   Caron Ode, Craig Guess,  Pharm.D  06/01/2014  8:36 PM

## 2014-06-01 NOTE — ED Notes (Signed)
Present from Radiology with positive oblique femur fracture, pt reports fall on Monday-denies pain at this time-right femur pain when not stabilized. CMS intact. Orthopedist is [employer]

## 2014-06-01 NOTE — H&P (Signed)
Date: 06/01/2014               Patient Name:  Laura Roberts MRN: [id number]  DOB: [birth year] Age / Sex: 73 y.o., female   PCP: [name], MD         Medical Service: Internal Medicine Teaching Service         Attending Physician: Dr. Bartholomew Crews, MD    First Contact: Dr. Dellia Nims Pager: [phone]  Second Contact: Dr. Michail Jewels Pager: [phone]       After Hours (After 5p/  First Contact Pager: [phone]  weekends / holidays): Second Contact Pager: [phone]   Chief Complaint: Right leg pain  History of Present Illness: Laura Roberts is a 73yo woman w/ PMHx of multiple sclerosis, HTN, Type 2 DM, and hx of bilateral total knee replacements who presented to the ED with right leg pain. Patient reports she had a fall on Monday (05/30/14) which resulted in severe right leg pain. She states she was using the comode at her son's house and was transferring back to her wheelchair when she slipped and fell onto her right leg. She reports she is unable to bear any weight on her leg. She is normally wheelchair-bound but uses her right leg to stand and pivot. She denies any pain currently. She states she only has pain when trying to mobilize. She normally takes Gabapentin and Cymbalta for pain.   Meds: Current Facility-Administered Medications  Medication Dose Route Frequency Provider Last Rate Last Dose  . ceFAZolin (ANCEF) IVPB 2 g/50 mL premix  2 g Intravenous 30 min Pre-Op Johnny Bridge, MD        Allergies: Allergies as of 06/01/2014 - Review Complete 06/01/2014  Allergen Reaction Noted  . Codeine Other (See Comments) 07/21/2012  . Ultram [tramadol] Other (See Comments) 07/21/2012   Past Medical History  Diagnosis Date  . Hypertension   . Multiple sclerosis   . Diabetes mellitus without complication   . Neuromuscular disorder     Bilateral hand carpal tunnel syndrome  . Gait disturbance   . Obesity   . Spinal stenosis in cervical region   . Spinal stenosis of  lumbosacral region   . Spinal stenosis, thoracic   . Degenerative arthritis    Past Surgical History  Procedure Laterality Date  . Replacement total knee bilateral  2004  . Abdominal hysterectomy    . Cholecystectomy    . Back surgery    . Left hand carpal tunnel surgery    . Cataract extraction Right    Family History  Problem Relation Age of Onset  . Multiple sclerosis Other     neices.   . Cancer Mother   . Diabetes Mother   . GI Bleed Sister     diverticulitis   History   Social History  . Marital Status: Widowed    Spouse Name: N/A    Number of Children: 2  . Years of Education: 61yr colleg   Occupational History  . NIGHT RESIDENT [employer]    Social History Main Topics  . Smoking status: Never Smoker   . Smokeless tobacco: Never Used  . Alcohol Use: No  . Drug Use: No  . Sexual Activity: No   Other Topics Concern  . Not on file   Social History Narrative   Patient is widowed with 2 children   Patient is right handed   Patient has 2 yrs of college   Patient drinks 2 cups of  tea daily    Review of Systems: General: Denies fever, chills, night sweats, changes in weight, changes in appetite HEENT: Denies headaches, ear pain, changes in vision, rhinorrhea, sore throat CV: Denies CP, palpitations, SOB, orthopnea Pulm: Denies SOB, cough, wheezing GI: Denies abdominal pain, nausea, vomiting, diarrhea, constipation, melena, hematochezia GU: Denies dysuria, hematuria, frequency Msk: See HPI Neuro: Denies numbness, tingling Skin: Denies rashes, bruising  Physical Exam: Blood pressure 150/81, pulse 92, temperature 97.6 F (36.4 C), temperature source Oral, resp. rate 16, SpO2 98 %. General: alert, sitting up in bed, NAD HEENT: [place]/AT, EOMI, sclera anicteric, mucus membranes moist CV: RRR, normal S1/S2, no m/g/r Pulm: CTA bilaterally, breaths non-labored Abd: BS+, soft, non-tender Ext: warm, no edema. Right leg in orthopedic leg brace. Distal pulses 2+ Neuro:  alert and oriented x 3, CNs II-XII intact, strength diminished in RLE  Lab results: Basic Metabolic Panel:  Recent Labs  06/01/14 1653  NA 138  K 4.7  CL 94*  CO2 21  GLUCOSE 269*  BUN 16  CREATININE 0.58  CALCIUM 9.9   Liver Function Tests:  Recent Labs  06/01/14 1653  AST 18  ALT 15  ALKPHOS 62  BILITOT 1.2  PROT 6.8  ALBUMIN 3.6   No results for input(s): LIPASE, AMYLASE in the last 72 hours. No results for input(s): AMMONIA in the last 72 hours. CBC:  Recent Labs  06/01/14 1653  WBC 13.5*  NEUTROABS 11.8*  HGB 11.9*  HCT 36.7  MCV 89.5  PLT 237   Cardiac Enzymes: No results for input(s): CKTOTAL, CKMB, CKMBINDEX, TROPONINI in the last 72 hours. BNP: No results for input(s): PROBNP in the last 72 hours. D-Dimer: No results for input(s): DDIMER in the last 72 hours. CBG: No results for input(s): GLUCAP in the last 72 hours. Hemoglobin A1C: No results for input(s): HGBA1C in the last 72 hours. Fasting Lipid Panel: No results for input(s): CHOL, HDL, LDLCALC, TRIG, CHOLHDL, LDLDIRECT in the last 72 hours. Thyroid Function Tests: No results for input(s): TSH, T4TOTAL, FREET4, T3FREE, THYROIDAB in the last 72 hours. Anemia Panel: No results for input(s): VITAMINB12, FOLATE, FERRITIN, TIBC, IRON, RETICCTPCT in the last 72 hours. Coagulation: No results for input(s): LABPROT, INR in the last 72 hours. Urine Drug Screen: Drugs of Abuse  No results found for: LABOPIA, COCAINSCRNUR, LABBENZ, AMPHETMU, THCU, LABBARB  Alcohol Level: No results for input(s): ETH in the last 72 hours. Urinalysis: No results for input(s): COLORURINE, LABSPEC, PHURINE, GLUCOSEU, HGBUR, BILIRUBINUR, KETONESUR, PROTEINUR, UROBILINOGEN, NITRITE, LEUKOCYTESUR in the last 72 hours.  Invalid input(s): APPERANCEUR   Imaging results:  Dg Chest Portable 1 View  06/01/2014   CLINICAL DATA:  Preop evaluation.  EXAM: PORTABLE CHEST - 1 VIEW  COMPARISON:  July 24, 2012.  FINDINGS:  Stable cardiomediastinal silhouette. No pneumothorax or pleural effusion is noted. Both lungs are clear. The visualized skeletal structures are unremarkable.  IMPRESSION: No acute cardiopulmonary abnormality seen.   Electronically Signed   By: Sabino Dick M.D.   On: 06/01/2014 16:55   Dg Knee Complete 4 Views Right  06/01/2014   CLINICAL DATA:  Right knee pain in its entirety. Pt. Fell on right knee in bathroom at sons home on tile floor 3 days ago. Hx of right knee replacement in 2003.  EXAM: RIGHT KNEE - COMPLETE 4+ VIEW  COMPARISON:  None.  FINDINGS: Status post total knee arthroplasty. An oblique fracture of the distal femoral meta diaphysis with approximately 1/3 shaft with displacement of distal fracture  fragment medially. Posttraumatic. Tibial component appears normal. Probable small suprapatellar joint effusion.  IMPRESSION: Oblique fracture of the distal femur, just cephalad to the femoral component of the total knee arthroplasty.  These results will be called to the ordering clinician or representative by the Radiologist Assistant, and communication documented in the PACS or zVision Dashboard.   Electronically Signed   By: Abigail Miyamoto M.D.   On: 06/01/2014 15:24    Other results: EKG: Normal sinus rhythm. Low voltage, precordial leads. Borderline repolarization abnormality. Worsening ST depressions inferiorly.  Assessment & Plan by Problem: Principal Problem:   Closed supracondylar fracture of right femur, periprosthetic Active Problems:   Paraplegia Ms. Teall is a 73yo woman w/ PMHx of multiple sclerosis, HTN, Type 2 DM, and hx of bilateral total knee replacements who presented to the ED with right leg pain 2/2 right periprosthetic femur fracture.  Right Periprosthetic Femur Fracture: Confirmed by x-ray. Patient has already been seen by orthopedics. Ortho plans to do surgery tomorrow.  - Orthopedics on board, appreciate recommendations - NPO at midnight - Pain control- pt not  complaining of pain. Will give Tylenol 650 mg Q6H PRN. Consider Toradol if pain worsens. - Ancef for surgical prophylaxis  Anion Gap Metabolic Acidosis: AG 23, bicarb reassuring at 21. Blood glucose 269, but likely not DKA in setting of type 2 DM. Could be from taking salicylates? Will check repeat bmet tomorrow morning and give fluids.  - bmet in AM - Check salicylate level - IVF @ 100 ml/hr  Multiple Sclerosis: Followed by [facility]. She takes Tecfidera 240 mg BID and Baclofen 10 mg in morning and midday and 20 mg at night for spastic paraparesis.  - Continue home meds  HTN: BP 140/73 on admission. She takes Amlodipine 5 mg QHS, Atenolol 50 mg QHS and Valsartan 320 mg QHS at home.  - Continue home meds  Type 2 DM: Last HbA1c 7.6 on 07/21/12. She takes Glimepiride 2 mg BID, Lantus 10 units QHS, and Metformin 1000 mg BID at home.  - Lantus 5 units QHS - Sensitive ISS - CBGs 4 times daily  Diet: Carb modified, NPO at midnight DVT/PE PPx: Hold Heparin SQ for surgery tomorrow Dispo: Disposition is deferred at this time, awaiting improvement of current medical problems. Anticipated discharge in approximately 1-2 day(s).   The patient does have a current PCP [name], MD) and does need an [facility] hospital follow-up appointment after discharge.  The patient does not have transportation limitations that hinder transportation to clinic appointments.  Signed: Albin Felling, MD 06/01/2014, 7:04 PM

## 2014-06-02 ENCOUNTER — Inpatient Hospital Stay (HOSPITAL_COMMUNITY): Payer: Medicare (Managed Care)

## 2014-06-02 ENCOUNTER — Encounter (HOSPITAL_COMMUNITY)
Admission: EM | Disposition: A | Discharge status: Skilled Nursing Facility | Payer: Self-pay | Source: Home / Self Care | Attending: Internal Medicine

## 2014-06-02 ENCOUNTER — Encounter (HOSPITAL_COMMUNITY): Payer: Self-pay | Admitting: Anesthesiology

## 2014-06-02 ENCOUNTER — Inpatient Hospital Stay (HOSPITAL_COMMUNITY)
Admission: EM | Admit: 2014-06-02 | Payer: Medicare (Managed Care) | Source: Ambulatory Visit | Admitting: Orthopedic Surgery

## 2014-06-02 ENCOUNTER — Inpatient Hospital Stay (HOSPITAL_COMMUNITY): Payer: Medicare (Managed Care) | Admitting: Anesthesiology

## 2014-06-02 DIAGNOSIS — G35 Multiple sclerosis: Secondary | ICD-10-CM

## 2014-06-02 DIAGNOSIS — S72454A Nondisplaced supracondylar fracture without intracondylar extension of lower end of right femur, initial encounter for closed fracture: Secondary | ICD-10-CM

## 2014-06-02 DIAGNOSIS — I10 Essential (primary) hypertension: Secondary | ICD-10-CM

## 2014-06-02 DIAGNOSIS — E119 Type 2 diabetes mellitus without complications: Secondary | ICD-10-CM

## 2014-06-02 HISTORY — PX: FEMUR IM NAIL: SHX1597

## 2014-06-02 LAB — GLUCOSE, CAPILLARY
GLUCOSE-CAPILLARY: 103 mg/dL — AB (ref 70–99)
GLUCOSE-CAPILLARY: 214 mg/dL — AB (ref 70–99)
Glucose-Capillary: 250 mg/dL — ABNORMAL HIGH (ref 70–99)
Glucose-Capillary: 78 mg/dL (ref 70–99)
Glucose-Capillary: 81 mg/dL (ref 70–99)
Glucose-Capillary: 89 mg/dL (ref 70–99)

## 2014-06-02 LAB — SURGICAL PCR SCREEN
MRSA, PCR: NEGATIVE
STAPHYLOCOCCUS AUREUS: NEGATIVE

## 2014-06-02 SURGERY — INSERTION, INTRAMEDULLARY ROD, FEMUR, RETROGRADE
Anesthesia: General | Laterality: Right

## 2014-06-02 MED ORDER — ALUM & MAG HYDROXIDE-SIMETH 200-200-20 MG/5ML PO SUSP: 30.0000 mL | ORAL | Status: DC | PRN

## 2014-06-02 MED ORDER — CEFAZOLIN SODIUM-DEXTROSE 2-3 GM-% IV SOLR
2.0000 g | Freq: Four times a day (QID) | INTRAVENOUS | Status: AC
  Administered 2014-06-02 – 2014-06-03 (×2): 2 g via INTRAVENOUS
  Filled 2014-06-02 (×2): qty 50

## 2014-06-02 MED ORDER — GLYCOPYRROLATE 0.2 MG/ML IJ SOLN
INTRAMUSCULAR | Status: DC | PRN
  Administered 2014-06-02: 0.2 mg via INTRAVENOUS

## 2014-06-02 MED ORDER — MAGNESIUM CITRATE PO SOLN: 1.0000 | Freq: Once | ORAL | Status: AC | PRN

## 2014-06-02 MED ORDER — PHENYLEPHRINE HCL 10 MG/ML IJ SOLN
INTRAMUSCULAR | Status: DC | PRN
  Administered 2014-06-02: 120 ug via INTRAVENOUS
  Administered 2014-06-02: 80 ug via INTRAVENOUS
  Administered 2014-06-02: 120 ug via INTRAVENOUS
  Administered 2014-06-02: 80 ug via INTRAVENOUS

## 2014-06-02 MED ORDER — ONDANSETRON HCL 4 MG/2ML IJ SOLN: 4.0000 mg | Freq: Once | INTRAMUSCULAR | Status: DC | PRN

## 2014-06-02 MED ORDER — HYDROMORPHONE HCL 1 MG/ML IJ SOLN
0.5000 mg | INTRAMUSCULAR | Status: AC | PRN
  Administered 2014-06-02 (×3): 0.5 mg via INTRAVENOUS
  Administered 2014-06-02: 0.25 mg via INTRAVENOUS

## 2014-06-02 MED ORDER — SODIUM CHLORIDE 0.9 % IV SOLN
10.0000 mg | INTRAVENOUS | Status: DC | PRN
  Administered 2014-06-02: 50 ug/min via INTRAVENOUS

## 2014-06-02 MED ORDER — DOCUSATE SODIUM 100 MG PO CAPS
100.0000 mg | ORAL_CAPSULE | Freq: Two times a day (BID) | ORAL | Status: DC
  Administered 2014-06-02 – 2014-06-06 (×8): 100 mg via ORAL
  Filled 2014-06-02 (×8): qty 1

## 2014-06-02 MED ORDER — METOCLOPRAMIDE HCL 5 MG/ML IJ SOLN: 5.0000 mg | Freq: Three times a day (TID) | INTRAMUSCULAR | Status: DC | PRN

## 2014-06-02 MED ORDER — LIDOCAINE HCL (CARDIAC) 20 MG/ML IV SOLN
INTRAVENOUS | Status: DC | PRN
  Administered 2014-06-02: 60 mg via INTRAVENOUS

## 2014-06-02 MED ORDER — LACTATED RINGERS IV SOLN
INTRAVENOUS | Status: DC | PRN
  Administered 2014-06-02 (×2): via INTRAVENOUS

## 2014-06-02 MED ORDER — ACETAMINOPHEN 650 MG RE SUPP: 650.0000 mg | Freq: Four times a day (QID) | RECTAL | Status: DC | PRN

## 2014-06-02 MED ORDER — ACETAMINOPHEN 325 MG PO TABS
650.0000 mg | ORAL_TABLET | Freq: Four times a day (QID) | ORAL | Status: DC | PRN
  Administered 2014-06-03 – 2014-06-06 (×6): 650 mg via ORAL
  Filled 2014-06-02 (×6): qty 2

## 2014-06-02 MED ORDER — HYDROCODONE-ACETAMINOPHEN 5-325 MG PO TABS
1.0000 | ORAL_TABLET | Freq: Four times a day (QID) | ORAL | Status: DC | PRN
  Administered 2014-06-02 (×2): 2 via ORAL
  Filled 2014-06-02: qty 2

## 2014-06-02 MED ORDER — MORPHINE SULFATE 2 MG/ML IJ SOLN
0.5000 mg | INTRAMUSCULAR | Status: DC | PRN
  Administered 2014-06-02: 0.5 mg via INTRAVENOUS
  Filled 2014-06-02: qty 1

## 2014-06-02 MED ORDER — MENTHOL 3 MG MT LOZG: 1.0000 | LOZENGE | OROMUCOSAL | Status: DC | PRN

## 2014-06-02 MED ORDER — LIDOCAINE HCL (CARDIAC) 20 MG/ML IV SOLN
INTRAVENOUS | Status: AC
  Filled 2014-06-02: qty 5

## 2014-06-02 MED ORDER — ONDANSETRON HCL 4 MG/2ML IJ SOLN: 4.0000 mg | Freq: Four times a day (QID) | INTRAMUSCULAR | Status: DC | PRN

## 2014-06-02 MED ORDER — HYDROCODONE-ACETAMINOPHEN 10-325 MG PO TABS: 1.0000 | ORAL_TABLET | Freq: Four times a day (QID) | ORAL | Status: DC | PRN

## 2014-06-02 MED ORDER — METOCLOPRAMIDE HCL 10 MG PO TABS: 5.0000 mg | ORAL_TABLET | Freq: Three times a day (TID) | ORAL | Status: DC | PRN

## 2014-06-02 MED ORDER — HYDROMORPHONE HCL 1 MG/ML IJ SOLN
INTRAMUSCULAR | Status: AC
  Administered 2014-06-02: 17:00:00
  Filled 2014-06-02: qty 1

## 2014-06-02 MED ORDER — ONDANSETRON HCL 4 MG PO TABS: 4.0000 mg | ORAL_TABLET | Freq: Four times a day (QID) | ORAL | Status: DC | PRN

## 2014-06-02 MED ORDER — FENTANYL CITRATE 0.05 MG/ML IJ SOLN
INTRAMUSCULAR | Status: AC
  Filled 2014-06-02: qty 5

## 2014-06-02 MED ORDER — PHENYLEPHRINE 40 MCG/ML (10ML) SYRINGE FOR IV PUSH (FOR BLOOD PRESSURE SUPPORT)
PREFILLED_SYRINGE | INTRAVENOUS | Status: AC
  Filled 2014-06-02: qty 10

## 2014-06-02 MED ORDER — HYDROMORPHONE HCL 1 MG/ML IJ SOLN: 0.2500 mg | INTRAMUSCULAR | Status: DC | PRN

## 2014-06-02 MED ORDER — ONDANSETRON HCL 4 MG/2ML IJ SOLN
INTRAMUSCULAR | Status: AC
  Filled 2014-06-02: qty 2

## 2014-06-02 MED ORDER — HYDROCORTISONE NA SUCCINATE PF 1000 MG IJ SOLR
INTRAMUSCULAR | Status: DC | PRN
  Administered 2014-06-02: 100 mg via INTRAVENOUS

## 2014-06-02 MED ORDER — ENOXAPARIN SODIUM 40 MG/0.4ML ~~LOC~~ SOLN
40.0000 mg | SUBCUTANEOUS | Status: DC
  Administered 2014-06-03 – 2014-06-06 (×4): 40 mg via SUBCUTANEOUS
  Filled 2014-06-02 (×5): qty 0.4

## 2014-06-02 MED ORDER — 0.9 % SODIUM CHLORIDE (POUR BTL) OPTIME
TOPICAL | Status: DC | PRN
  Administered 2014-06-02: 1000 mL

## 2014-06-02 MED ORDER — ENOXAPARIN SODIUM 40 MG/0.4ML ~~LOC~~ SOLN: 40.0000 mg | SUBCUTANEOUS | Status: DC

## 2014-06-02 MED ORDER — CEFAZOLIN SODIUM-DEXTROSE 2-3 GM-% IV SOLR
INTRAVENOUS | Status: AC
  Filled 2014-06-02: qty 50

## 2014-06-02 MED ORDER — LIDOCAINE HCL 4 % MT SOLN
OROMUCOSAL | Status: DC | PRN
  Administered 2014-06-02: 2 mL via TOPICAL

## 2014-06-02 MED ORDER — SENNA 8.6 MG PO TABS
1.0000 | ORAL_TABLET | Freq: Two times a day (BID) | ORAL | Status: DC
  Administered 2014-06-02 – 2014-06-06 (×8): 8.6 mg via ORAL
  Filled 2014-06-02 (×9): qty 1

## 2014-06-02 MED ORDER — HYDROCODONE-ACETAMINOPHEN 5-325 MG PO TABS
ORAL_TABLET | ORAL | Status: AC
  Administered 2014-06-02: 17:00:00
  Filled 2014-06-02: qty 2

## 2014-06-02 MED ORDER — PROPOFOL 10 MG/ML IV BOLUS
INTRAVENOUS | Status: AC
  Filled 2014-06-02: qty 20

## 2014-06-02 MED ORDER — ROCURONIUM BROMIDE 50 MG/5ML IV SOLN
INTRAVENOUS | Status: AC
  Filled 2014-06-02: qty 1

## 2014-06-02 MED ORDER — POLYETHYLENE GLYCOL 3350 17 G PO PACK: 17.0000 g | PACK | Freq: Every day | ORAL | Status: DC | PRN

## 2014-06-02 MED ORDER — PHENOL 1.4 % MT LIQD: 1.0000 | OROMUCOSAL | Status: DC | PRN

## 2014-06-02 MED ORDER — PROPOFOL 10 MG/ML IV BOLUS
INTRAVENOUS | Status: DC | PRN
  Administered 2014-06-02: 150 mg via INTRAVENOUS

## 2014-06-02 MED ORDER — LACTATED RINGERS IV SOLN: INTRAVENOUS | Status: DC

## 2014-06-02 MED ORDER — FENTANYL CITRATE 0.05 MG/ML IJ SOLN
INTRAMUSCULAR | Status: DC | PRN
  Administered 2014-06-02 (×2): 50 ug via INTRAVENOUS
  Administered 2014-06-02: 100 ug via INTRAVENOUS
  Administered 2014-06-02: 50 ug via INTRAVENOUS

## 2014-06-02 MED ORDER — BISACODYL 10 MG RE SUPP: 10.0000 mg | Freq: Every day | RECTAL | Status: DC | PRN

## 2014-06-02 MED ORDER — PREDNISONE 5 MG PO TABS
7.5000 mg | ORAL_TABLET | ORAL | Status: DC
  Administered 2014-06-03 – 2014-06-05 (×2): 7.5 mg via ORAL
  Filled 2014-06-02 (×5): qty 1

## 2014-06-02 SURGICAL SUPPLY — 70 items
BANDAGE ELASTIC 4 VELCRO ST LF (GAUZE/BANDAGES/DRESSINGS) ×1 IMPLANT
BANDAGE ELASTIC 6 VELCRO ST LF (GAUZE/BANDAGES/DRESSINGS) ×2 IMPLANT
BANDAGE ESMARK 6X9 LF (GAUZE/BANDAGES/DRESSINGS) IMPLANT
BIT DRILL CALIBRATED 4.3MMX365 (DRILL) IMPLANT
BIT DRILL CROWE PNT TWST 4.5MM (DRILL) IMPLANT
BLADE SURG 15 STRL LF DISP TIS (BLADE) ×1 IMPLANT
BLADE SURG 15 STRL SS (BLADE) ×2
BLADE SURG ROTATE 9660 (MISCELLANEOUS) IMPLANT
BNDG CMPR 9X6 STRL LF SNTH (GAUZE/BANDAGES/DRESSINGS)
BNDG COHESIVE 6X5 TAN STRL LF (GAUZE/BANDAGES/DRESSINGS) ×2 IMPLANT
BNDG ESMARK 6X9 LF (GAUZE/BANDAGES/DRESSINGS)
BNDG GAUZE ELAST 4 BULKY (GAUZE/BANDAGES/DRESSINGS) ×2 IMPLANT
BOOTCOVER CLEANROOM LRG (PROTECTIVE WEAR) ×2 IMPLANT
CANISTER SUCT 3000ML (MISCELLANEOUS) ×2 IMPLANT
CLSR STERI-STRIP ANTIMIC 1/2X4 (GAUZE/BANDAGES/DRESSINGS) ×2 IMPLANT
COVER SURGICAL LIGHT HANDLE (MISCELLANEOUS) ×2 IMPLANT
CUFF TOURNIQUET SINGLE 34IN LL (TOURNIQUET CUFF) IMPLANT
CUFF TOURNIQUET SINGLE 44IN (TOURNIQUET CUFF) IMPLANT
DRAPE C-ARM 42X72 X-RAY (DRAPES) ×2 IMPLANT
DRAPE C-ARMOR (DRAPES) ×2 IMPLANT
DRAPE IMP U-DRAPE 54X76 (DRAPES) ×2 IMPLANT
DRAPE INCISE IOBAN 66X45 STRL (DRAPES) ×2 IMPLANT
DRAPE ORTHO SPLIT 77X108 STRL (DRAPES) ×6
DRAPE PROXIMA HALF (DRAPES) ×3 IMPLANT
DRAPE SURG ORHT 6 SPLT 77X108 (DRAPES) ×2 IMPLANT
DRAPE U-SHAPE 47X51 STRL (DRAPES) ×2 IMPLANT
DRILL CALIBRATED 4.3MMX365 (DRILL) ×2
DRILL CROWE POINT TWIST 4.5MM (DRILL) ×2
DRSG ADAPTIC 3X8 NADH LF (GAUZE/BANDAGES/DRESSINGS) ×1 IMPLANT
DRSG MEPILEX BORDER 4X4 (GAUZE/BANDAGES/DRESSINGS) ×2 IMPLANT
DRSG PAD ABDOMINAL 8X10 ST (GAUZE/BANDAGES/DRESSINGS) ×1 IMPLANT
DURAPREP 26ML APPLICATOR (WOUND CARE) ×2 IMPLANT
ELECT REM PT RETURN 9FT ADLT (ELECTROSURGICAL) ×2
ELECTRODE REM PT RTRN 9FT ADLT (ELECTROSURGICAL) ×1 IMPLANT
FACESHIELD WRAPAROUND (MASK) ×2 IMPLANT
FACESHIELD WRAPAROUND OR TEAM (MASK) IMPLANT
GAUZE SPONGE 4X4 12PLY STRL (GAUZE/BANDAGES/DRESSINGS) ×3 IMPLANT
GLOVE BIOGEL PI ORTHO PRO SZ8 (GLOVE) ×2
GLOVE ORTHO TXT STRL SZ7.5 (GLOVE) ×2 IMPLANT
GLOVE PI ORTHO PRO STRL SZ8 (GLOVE) ×2 IMPLANT
GLOVE SURG ORTHO 8.0 STRL STRW (GLOVE) ×4 IMPLANT
GOWN STRL REUS W/ TWL LRG LVL3 (GOWN DISPOSABLE) IMPLANT
GOWN STRL REUS W/ TWL XL LVL3 (GOWN DISPOSABLE) ×2 IMPLANT
GOWN STRL REUS W/TWL 2XL LVL3 (GOWN DISPOSABLE) ×2 IMPLANT
GOWN STRL REUS W/TWL LRG LVL3 (GOWN DISPOSABLE) ×4
GOWN STRL REUS W/TWL XL LVL3 (GOWN DISPOSABLE) ×2
GUIDEWIRE BEAD TIP (WIRE) ×1 IMPLANT
KIT BASIN OR (CUSTOM PROCEDURE TRAY) ×2 IMPLANT
KIT ROOM TURNOVER OR (KITS) ×2 IMPLANT
NAIL FEM RETRO 9X340 (Nail) ×1 IMPLANT
NS IRRIG 1000ML POUR BTL (IV SOLUTION) ×2 IMPLANT
PACK GENERAL/GYN (CUSTOM PROCEDURE TRAY) ×2 IMPLANT
PACK UNIVERSAL I (CUSTOM PROCEDURE TRAY) ×1 IMPLANT
PAD ARMBOARD 7.5X6 YLW CONV (MISCELLANEOUS) ×4 IMPLANT
PIN GUIDE 3.2 903003004 (MISCELLANEOUS) ×1 IMPLANT
SCREW CORT TI DBL LEAD 5X56 (Screw) ×1 IMPLANT
SCREW CORT TI DBL LEAD 5X65 (Screw) ×1 IMPLANT
SCREW CORT TI DBL LEAD 5X70 (Screw) ×1 IMPLANT
STAPLER VISISTAT 35W (STAPLE) ×1 IMPLANT
STOCKINETTE IMPERVIOUS LG (DRAPES) ×2 IMPLANT
SUT MNCRL AB 4-0 PS2 18 (SUTURE) ×2 IMPLANT
SUT VIC AB 0 CT1 18XCR BRD 8 (SUTURE) ×1 IMPLANT
SUT VIC AB 0 CT1 8-18 (SUTURE) ×2
SUT VIC AB 2-0 CT1 27 (SUTURE)
SUT VIC AB 2-0 CT1 TAPERPNT 27 (SUTURE) ×1 IMPLANT
SUT VIC AB 3-0 SH 8-18 (SUTURE) ×2 IMPLANT
TOWEL OR 17X24 6PK STRL BLUE (TOWEL DISPOSABLE) ×2 IMPLANT
TOWEL OR 17X26 10 PK STRL BLUE (TOWEL DISPOSABLE) ×2 IMPLANT
TRAY FOLEY CATH 16FRSI W/METER (SET/KITS/TRAYS/PACK) IMPLANT
WATER STERILE IRR 1000ML POUR (IV SOLUTION) ×1 IMPLANT

## 2014-06-02 NOTE — Progress Notes (Signed)
The patient has been re-examined, and the chart reviewed, and there have been no interval changes to the documented history and physical.    The risks, benefits, and alternatives have been discussed at length, and the patient is willing to proceed.    Dc heparin.  Johnny Bridge, MD

## 2014-06-02 NOTE — Anesthesia Procedure Notes (Addendum)
Procedure Name: Intubation Date/Time: 06/02/2014 1:24 PM Performed by: Rush Farmer E Pre-anesthesia Checklist: Patient identified, Emergency Drugs available, Suction available, Patient being monitored and Timeout performed Patient Re-evaluated:Patient Re-evaluated prior to inductionOxygen Delivery Method: Circle system utilized Preoxygenation: Pre-oxygenation with 100% oxygen Intubation Type: IV induction Ventilation: Mask ventilation without difficulty Laryngoscope Size: Mac and 3 Grade View: Grade II Tube type: Oral Tube size: 7.0 mm Number of attempts: 1 Airway Equipment and Method: Stylet Placement Confirmation: positive ETCO2 and breath sounds checked- equal and bilateral Secured at: 21 cm Tube secured with: Tape Dental Injury: Teeth and Oropharynx as per pre-operative assessment

## 2014-06-02 NOTE — Op Note (Signed)
06/01/2014 - 06/02/2014  2:46 PM  PATIENT:  [name]    PRE-OPERATIVE DIAGNOSIS:  periprosthetic supracondylar right femur fracture  POST-OPERATIVE DIAGNOSIS:  Same  PROCEDURE:  INTRAMEDULLARY (IM) RETROGRADE FEMORAL NAILING right supracondylar periprosthetic femur fracture  SURGEON:  Johnny Bridge, MD  PHYSICIAN ASSISTANT: Joya Gaskins, OPA-C, present and scrubbed throughout the case, critical for completion in a timely fashion, and for retraction, instrumentation, and closure.  ANESTHESIA:   General  PREOPERATIVE INDICATIONS:  Laura Roberts is a  73 y.o. female with a diagnosis of periprosthetic femur fracture who is nonambulatory due to multiple sclerosis, however she did use that leg for posting and pivoting. She elected for surgical management to optimize pain as well as restore the capacity to post & pivot on that leg.  The risks benefits and alternatives were discussed with the patient including but not limited to the risks of nonoperative treatment, versus surgical intervention including infection, bleeding, nerve injury, malunion, nonunion, the need for revision surgery, hardware prominence, hardware failure, the need for hardware removal, blood clots, cardiopulmonary complications, morbidity, mortality, among others, and they were willing to proceed.     OPERATIVE IMPLANTS: [employer] [place] retrograde femoral nail with 3 distal interlocking bolts  OPERATIVE FINDINGS: Extreme osteopenia distally, however her cortices were extremely stout and quite tight, and I had an extremely strong press-fit interference fit with the size 9 nail in the femoral diaphysis, such that a proximal interlocking bolt was not required.  OPERATIVE PROCEDURE: The patient was brought to the operating room and placed in supine position. Gen. anesthesia was administered. IV antibiotics were given. The right lower extremity was prepped and draped in usual sterile fashion. Time out was performed.  Anterior incision over her previous total knee incision was carried out. The patella was subluxated laterally. The knee was flexed, the fracture reduced, and I introduced a guidewire into the appropriate position through the notch of the prosthesis. Confirmation was made on AP and lateral views. I opened the distal femur, reduced the fracture anatomically, and placed a long guidewire. I measured this to a size 340 mm.  I reamed and had excellent chatter and went all the way up to a 10.5 and then placed a size 9 nail. I had to mallet this down fairly significantly. There was substantial rotational stability to the construct already. I countersunk the nail just slightly, in order to minimize prominence, and then secured the distal fragment using a total of 3 interlocking bolts. I secured the locking screw sleeve over the bolts. The screwheads were downgoing completely to bone, and I had excellent bicortical fixation. The fourth screw proximally was within the fracture site and was not placed.  I did not place a proximal locking bolt because already had adequate rotational and appositional stability.  I irrigated the wounds copiously, and repaired the parapatellar tissue with Vicryl followed by Vicryl for the subcutaneous tissue and Steri-Strips and sterile gauze and a knee immobilizer. The polyethylene post was protected throughout the case.  She tolerated the procedure well and there were no complications.

## 2014-06-02 NOTE — Progress Notes (Signed)
Subjective:  Patient is doing well except for leg spasm on the right leg. She states she was totally independent 1 year ago before she had a MS flare and also hospitalization for bronchitis. Since then she had been wheel chair bound be still able to do everything on her own. She lives alone but has aide at home. She felt yesterday at her son's house trying to transfer to the commode from wheel chair which cause the right fem fracture. This may be considered fragility fracture and require bisphosphonate in the future.   Doing well other wise. Denies any sob, cp, cough, n/v, fever, chills, headache, numbness, weakness, tingling.  Going to OR today with ortho Dr. Mardelle Matte for right fem fracture repair.  Objective: Vital signs in last 24 hours: Filed Vitals:   06/01/14 2035 06/02/14 0000 06/02/14 0400 06/02/14 0407  BP: 158/69   114/76  Pulse: 94   77  Temp: 98.2 F (36.8 C)   97.8 F (36.6 C)  TempSrc:      Resp: 18 18 16 18   Height:      Weight:      SpO2: 99% 99% 100% 100%   Weight change:   Intake/Output Summary (Last 24 hours) at 06/02/14 1146 Last data filed at 06/02/14 0900  Gross per 24 hour  Intake    885 ml  Output    500 ml  Net    385 ml   Vitals reviewed. General: resting in bed, NAD HEENT: PERRL, EOMI, no scleral icterus Cardiac: RRR, no rubs, murmurs or gallops Pulm: clear to auscultation bilaterally, no wheezes, rales, or rhonchi Abd: soft, nontender, nondistended, BS present Ext: right leg has TTP throughout, is wearing knee immobilizer. Left leg normal. No pain. Neuro: alert and oriented X3, cranial nerves II-XII grossly intact, strength and sensation to light touch equal in bilateral upper and lower extremities  Lab Results: Basic Metabolic Panel:  Recent Labs Lab 06/01/14 1653  NA 138  K 4.7  CL 94*  CO2 21  GLUCOSE 269*  BUN 16  CREATININE 0.58  CALCIUM 9.9   Liver Function Tests:  Recent Labs Lab 06/01/14 1653  AST 18  ALT 15  ALKPHOS  62  BILITOT 1.2  PROT 6.8  ALBUMIN 3.6   No results for input(s): LIPASE, AMYLASE in the last 168 hours. No results for input(s): AMMONIA in the last 168 hours. CBC:  Recent Labs Lab 06/01/14 1653  WBC 13.5*  NEUTROABS 11.8*  HGB 11.9*  HCT 36.7  MCV 89.5  PLT 237   Cardiac Enzymes: No results for input(s): CKTOTAL, CKMB, CKMBINDEX, TROPONINI in the last 168 hours. BNP: No results for input(s): PROBNP in the last 168 hours. D-Dimer: No results for input(s): DDIMER in the last 168 hours. CBG:  Recent Labs Lab 06/01/14 2020 06/02/14 0023 06/02/14 0405 06/02/14 0916  GLUCAP 245* 250* 103* 81   Hemoglobin A1C: No results for input(s): HGBA1C in the last 168 hours. Fasting Lipid Panel: No results for input(s): CHOL, HDL, LDLCALC, TRIG, CHOLHDL, LDLDIRECT in the last 168 hours. Thyroid Function Tests: No results for input(s): TSH, T4TOTAL, FREET4, T3FREE, THYROIDAB in the last 168 hours. Coagulation: No results for input(s): LABPROT, INR in the last 168 hours. Anemia Panel: No results for input(s): VITAMINB12, FOLATE, FERRITIN, TIBC, IRON, RETICCTPCT in the last 168 hours. Urine Drug Screen: Drugs of Abuse  No results found for: LABOPIA, COCAINSCRNUR, LABBENZ, AMPHETMU, THCU, LABBARB  Alcohol Level: No results for input(s): ETH in the last  168 hours. Urinalysis: No results for input(s): COLORURINE, LABSPEC, PHURINE, GLUCOSEU, HGBUR, BILIRUBINUR, KETONESUR, PROTEINUR, UROBILINOGEN, NITRITE, LEUKOCYTESUR in the last 168 hours.  Invalid input(s): APPERANCEUR Misc. Labs:  Micro Results: Recent Results (from the past 240 hour(s))  Surgical PCR screen     Status: None   Collection Time: 06/01/14 10:22 PM  Result Value Ref Range Status   MRSA, PCR NEGATIVE NEGATIVE Final   Staphylococcus aureus NEGATIVE NEGATIVE Final    Comment:        The Xpert SA Assay (FDA approved for NASAL specimens in patients over 74 years of age), is one component of a comprehensive  surveillance program.  Test performance has been validated by [employer] for patients greater than or equal to 17 year old. It is not intended to diagnose infection nor to guide or monitor treatment.    Studies/Results: Dg Knee 1-2 Views Right  06/01/2014   CLINICAL DATA:  Right knee pain in its entirety. Pt. Fell on right knee in bathroom at sons home on tile floor 3 days ago. Hx of right knee replacement in 2003.  EXAM: RIGHT KNEE - COMPLETE 4+ VIEW  COMPARISON:  None.  FINDINGS: Status post total knee arthroplasty. An oblique fracture of the distal femoral meta diaphysis with approximately 1/3 shaft with displacement of distal fracture fragment medially. Posttraumatic. Tibial component appears normal. Probable small suprapatellar joint effusion.  IMPRESSION: Oblique fracture of the distal femur, just cephalad to the femoral component of the total knee arthroplasty.  These results will be called to the ordering clinician or representative by the Radiologist Assistant, and communication documented in the PACS or zVision Dashboard.   Electronically Signed   By: Abigail Miyamoto M.D.   On: 06/01/2014 15:24   Dg Chest Portable 1 View  06/01/2014   CLINICAL DATA:  Preop evaluation.  EXAM: PORTABLE CHEST - 1 VIEW  COMPARISON:  July 24, 2012.  FINDINGS: Stable cardiomediastinal silhouette. No pneumothorax or pleural effusion is noted. Both lungs are clear. The visualized skeletal structures are unremarkable.  IMPRESSION: No acute cardiopulmonary abnormality seen.   Electronically Signed   By: Sabino Dick M.D.   On: 06/01/2014 16:55   Medications: I have reviewed the patient's current medications. Scheduled Meds: . amLODipine  5 mg Oral QHS  . atenolol  50 mg Oral QHS  . baclofen  10 mg Oral Q24H  . baclofen  10 mg Oral Q24H  . baclofen  20 mg Oral QHS  .  ceFAZolin (ANCEF) IV  2 g Intravenous 30 min Pre-Op  . cholecalciferol  1,000 Units Oral Daily  . Dimethyl Fumarate  240 mg Oral BID  .  DULoxetine  90 mg Oral Daily  . gabapentin  300 mg Oral TID  . insulin aspart  0-9 Units Subcutaneous 6 times per day  . insulin glargine  5 Units Subcutaneous QHS  . irbesartan  300 mg Oral Daily  . oxybutynin  5 mg Oral BID  . pantoprazole  40 mg Oral Daily  . [START ON 06/03/2014] predniSONE  7.5 mg Oral Q48H   Continuous Infusions: . sodium chloride 100 mL/hr at 06/01/14 2109   PRN Meds:.acetaminophen Assessment/Plan: Principal Problem:   Closed supracondylar fracture of right femur, periprosthetic Active Problems:   Multiple sclerosis   HTN (hypertension)   DM (diabetes mellitus)   Paraplegia   Femoral fracture 73 yo female with MS, HTN, DM II, b/l knee replacement here with right periprosthetic femur fracture.  Right femur fracture, periprosthetic - s/p  fall at home during transferring from wheel chair to commode.  - OR today with Dr. Mardelle Matte - f/up ortho recs after surgery - will consult PT tomorrow to help with diso - pain control with tylenol so far. Patient doesn't want anything stronger. - having muscle spasm - continue baclofen. Also cont cymbalta and neurontin   MS - followed by [facility] neuro. is on prednisone + tecifedera  - baclofen for spasm  HTN - conti amlodipine 5mg , atenolol 50, valsartan 320 home regimen  DM II - hgba1c 7.6 on 07/21/12. Takes glimepiride 2 mg BID, lantus 10 units qhs and metformin 1000mg  BID at home - lantus 5 units qhs + SSI here.  Diet: NPO for surgery. Ok to resume diet slowly after that. DVT: holding heparin per ortho for surgery.    Dispo: Disposition is deferred at this time, awaiting improvement of current medical problems.  Anticipated discharge in approximately 1-2 day(s).   The patient does have a current PCP [name], MD) and does need an [facility] hospital follow-up appointment after discharge.  The patient does have transportation limitations that hinder transportation to clinic appointments.  .Services Needed at  time of discharge: Y = Yes, Blank = No PT:   OT:   RN:   Equipment:   Other:     LOS: 1 day   Dellia Nims, MD 06/02/2014, 11:46 AM

## 2014-06-02 NOTE — Progress Notes (Signed)
Orthopedic Tech Progress Note Patient Details:  Laura Roberts [birth year] [id number]  Ortho Devices Ortho Device/Splint Location: applied ohf to bed Ortho Device/Splint Interventions: Ordered, Application   Braulio Bosch 06/02/2014, 3:58 PM

## 2014-06-02 NOTE — Progress Notes (Signed)
  Date: 06/02/2014  Patient name: TAHJ LINDSETH  Medical record number: [id number]  Date of birth: July 11, 1940   I have seen and evaluated [name] and discussed their care with the Residency Team. I saw Ms Chhim about 10 AM and have discussed her care with Dr Genene Churn and agree with his progress note. Today, she is having spasms of her R leg but the baclofen helps a bit. She is to go to OR about noon. She has been to [facility] and did not particulary like the center but understands she might need SNF temp after the OR depending on her recovery. She was living indep but participated in [place] and had an aide for a few hrs each day. She needed help with laundry as her W/D was on the porch and her WC could not reach it.   Assessment and Plan: I have seen and evaluated the patient as outlined above. I agree with the formulated Assessment and Plan as detailed in the residents' admission note, with the following changes:   1. Right Periprosthetic Femur Fracture - ortho is taking pt to the OR for surgical repair. Will need to coordinate with ortho DVT prophylaxis and pain control. Will need PT/OT when agreeable with ortho to assess dispo needs.   2. Fragility fx - has had DEXA in past. Will need DEXA as outpt and consider bisphosphonate tx.   3. DM - cont home meds but with lantus adjustment based on PO intake.  4. HTN - home meds.  Bartholomew Crews, MD 12/3/20153:41 PM

## 2014-06-02 NOTE — Progress Notes (Signed)
Utilization review completed.  

## 2014-06-02 NOTE — Anesthesia Postprocedure Evaluation (Signed)
Anesthesia Post Note  Patient: Laura Roberts  Procedure(s) Performed: Procedure(s) (LRB): INTRAMEDULLARY (IM) RETROGRADE FEMORAL NAILING (Right)  Anesthesia type: General  Patient location: PACU  Post pain: Pain level controlled and Adequate analgesia  Post assessment: Post-op Vital signs reviewed, Patient's Cardiovascular Status Stable, Respiratory Function Stable, Patent Airway and Pain level controlled  Last Vitals:  Filed Vitals:   06/02/14 1630  BP: 138/76  Pulse: 67  Temp:   Resp: 8    Post vital signs: Reviewed and stable  Level of consciousness: awake, alert  and oriented  Complications: No apparent anesthesia complications

## 2014-06-02 NOTE — Transfer of Care (Signed)
Immediate Anesthesia Transfer of Care Note  Patient: Laura Roberts  Procedure(s) Performed: Procedure(s): INTRAMEDULLARY (IM) RETROGRADE FEMORAL NAILING (Right)  Patient Location: PACU  Anesthesia Type:General  Level of Consciousness: awake, alert  and oriented  Airway & Oxygen Therapy: Patient Spontanous Breathing and Patient connected to face mask oxygen  Post-op Assessment: Report given to PACU RN  Post vital signs: Reviewed and stable  Complications: No apparent anesthesia complications

## 2014-06-02 NOTE — Progress Notes (Signed)
Orthopedic Tech Progress Note Patient Details:  Laura Roberts 04/01/1941 [id number]  Patient ID: [name], female   DOB: [birth year], 73 y.o.   MRN: [id number] RN stated that pt is unable to use patient trapeze bar   Hildred Priest 06/02/2014, 7:35 AM

## 2014-06-02 NOTE — Progress Notes (Signed)
Report given to R. Mancel Bale, RN to resume care.

## 2014-06-02 NOTE — Anesthesia Preprocedure Evaluation (Signed)
Anesthesia Evaluation  Patient identified by MRN, date of birth, ID band  Reviewed: Allergy & Precautions, H&P , NPO status , Patient's Chart, lab work & pertinent test results  Airway Mallampati: II       Dental  (+) Teeth Intact   Pulmonary          Cardiovascular hypertension, Pt. on medications     Neuro/Psych  Neuromuscular disease    GI/Hepatic   Endo/Other  diabetes, Poorly Controlled, Type 2  Renal/GU      Musculoskeletal   Abdominal   Peds  Hematology   Anesthesia Other Findings   Reproductive/Obstetrics                             Anesthesia Physical Anesthesia Plan  ASA: III  Anesthesia Plan: General   Post-op Pain Management:    Induction: Intravenous  Airway Management Planned: Oral ETT  Additional Equipment:   Intra-op Plan:   Post-operative Plan: Extubation in OR  Informed Consent: I have reviewed the patients History and Physical, chart, labs and discussed the procedure including the risks, benefits and alternatives for the proposed anesthesia with the patient or authorized representative who has indicated his/her understanding and acceptance.   Dental advisory given  Plan Discussed with: CRNA and Anesthesiologist  Anesthesia Plan Comments:         Anesthesia Quick Evaluation

## 2014-06-02 NOTE — Progress Notes (Signed)
Patient is a DNR.  A copy of the form is in her chart.  Please change code status.  Thank you.

## 2014-06-03 ENCOUNTER — Encounter (HOSPITAL_COMMUNITY): Payer: Self-pay | Admitting: Orthopedic Surgery

## 2014-06-03 DIAGNOSIS — R4182 Altered mental status, unspecified: Secondary | ICD-10-CM

## 2014-06-03 LAB — CBC
HCT: 31.5 % — ABNORMAL LOW (ref 36.0–46.0)
Hemoglobin: 10.1 g/dL — ABNORMAL LOW (ref 12.0–15.0)
MCH: 29.1 pg (ref 26.0–34.0)
MCHC: 32.1 g/dL (ref 30.0–36.0)
MCV: 90.8 fL (ref 78.0–100.0)
PLATELETS: 225 10*3/uL (ref 150–400)
RBC: 3.47 MIL/uL — AB (ref 3.87–5.11)
RDW: 14.5 % (ref 11.5–15.5)
WBC: 12.4 10*3/uL — ABNORMAL HIGH (ref 4.0–10.5)

## 2014-06-03 LAB — GLUCOSE, CAPILLARY
GLUCOSE-CAPILLARY: 196 mg/dL — AB (ref 70–99)
GLUCOSE-CAPILLARY: 177 mg/dL — AB (ref 70–99)
GLUCOSE-CAPILLARY: 116 mg/dL — AB (ref 70–99)
Glucose-Capillary: 161 mg/dL — ABNORMAL HIGH (ref 70–99)
Glucose-Capillary: 179 mg/dL — ABNORMAL HIGH (ref 70–99)

## 2014-06-03 LAB — BASIC METABOLIC PANEL
ANION GAP: 13 (ref 5–15)
BUN: 15 mg/dL (ref 6–23)
CO2: 23 meq/L (ref 19–32)
CREATININE: 0.82 mg/dL (ref 0.50–1.10)
Calcium: 8.7 mg/dL (ref 8.4–10.5)
Chloride: 99 mEq/L (ref 96–112)
GFR, EST AFRICAN AMERICAN: 81 mL/min — AB (ref >90)
GFR, EST NON AFRICAN AMERICAN: 70 mL/min — AB (ref >90)
Glucose, Bld: 193 mg/dL — ABNORMAL HIGH (ref 70–99)
Potassium: 4.2 mEq/L (ref 3.7–5.3)
SODIUM: 135 meq/L — AB (ref 137–147)

## 2014-06-03 MED ORDER — SODIUM CHLORIDE 0.9 % IV BOLUS (SEPSIS): 500.0000 mL | Freq: Once | INTRAVENOUS | Status: DC

## 2014-06-03 MED ORDER — NALOXONE HCL 0.4 MG/ML IJ SOLN
INTRAMUSCULAR | Status: AC
  Filled 2014-06-03: qty 1

## 2014-06-03 MED ORDER — NALOXONE HCL 0.4 MG/ML IJ SOLN
0.4000 mg | INTRAMUSCULAR | Status: DC | PRN
  Administered 2014-06-03: 0.4 mg via INTRAVENOUS

## 2014-06-03 MED ORDER — SODIUM CHLORIDE 0.9 % IV BOLUS (SEPSIS)
500.0000 mL | Freq: Once | INTRAVENOUS | Status: AC
  Administered 2014-06-03: 500 mL via INTRAVENOUS

## 2014-06-03 NOTE — Progress Notes (Signed)
Occupational Therapy Evaluation Patient Details Name: Laura Roberts MRN: [id number] DOB: February 03, 1941 Today's Date: 06/03/2014    History of Present Illness Dama Hedgepeth. Sloan is a 73 y.o. Female s/p Right IM nail for femur fx resulting from a fall at home during w/c transfer. PMH: HTN, MS, DM, Bil CTS, gait disturbance, obeity, spinal stenosis (cervical, lumbosacral, and thoracic), athritis. Surgical hx of Bil TKA (2004), back surgery, Left CTR, cataract extraction.   Clinical Impression   PTA pt lived at home alone. Pt too lethargic during OT/PT session to provide PLOF and home environment information, however per chart she is limited to w/c and performed SPT using Right leg to get to [facility] and w/c. Pt was able to report that she has assistance from [facility] for ADLs and IADLs. Pt currently requires total (A) for all ADLs due to lethargy and OT will continue to assess as pt's arousal increases. Pt will benefit from SNF at d/c due to limited caregiver support and level of assistance required. Pt will benefit from acute OT to address EOB sitting, bed mobility, and toilet transfers.     Follow Up Recommendations  SNF;Supervision/Assistance - 24 hour    Equipment Recommendations  Other (comment) (TBD when pt more alert to report home DME)    Recommendations for Other Services       Precautions / Restrictions Precautions Precautions: Fall Required Braces or Orthoses: Knee Immobilizer - Right Restrictions Weight Bearing Restrictions: Yes RLE Weight Bearing: Partial weight bearing RLE Partial Weight Bearing Percentage or Pounds: 50%      Mobility Bed Mobility Overal bed mobility: +2 for physical assistance;Needs Assistance Bed Mobility: Supine to Sit;Sit to Supine     Supine to sit: Total assist;+2 for physical assistance Sit to supine: Total assist;+2 for physical assistance   General bed mobility comments: Due to lethargy, pt required +2 total (A) for all bed mobility.   Transfers                  General transfer comment: Not completed at this time due to safety.     Balance Overall balance assessment: Needs assistance Sitting-balance support: Bilateral upper extremity supported;Feet supported Sitting balance-Leahy Scale: Zero Sitting balance - Comments: Pt lethargic and unable to support self in EOB sitting.                                     ADL Overall ADL's : Needs assistance/impaired                                       General ADL Comments: Pt is essentially total (A) at present due to level of arousal. OT will continue to assess, however pt will likely need SNF due to limited caregiver support and PWB status on RLE at this time. Pt required +2 (A) to sit EOB due to lethargy.       Vision                 Additional Comments: To be assessed when pt more alert.    Perception Perception Perception Tested?: No   Praxis Praxis Praxis tested?: Not tested    Pertinent Vitals/Pain Pain Assessment: No/denies pain     Hand Dominance     Extremity/Trunk Assessment Upper Extremity Assessment Upper Extremity Assessment: Overall WFL for tasks assessed (difficult  to assess due to lethargy, however able to use to)   Lower Extremity Assessment Lower Extremity Assessment: Defer to PT evaluation   Cervical / Trunk Assessment Cervical / Trunk Assessment: Normal   Communication Communication Communication: No difficulties;Other (comment) (lethargic)   Cognition Arousal/Alertness: Lethargic;Suspect due to medications Behavior During Therapy: Flat affect Overall Cognitive Status: Difficult to assess                                Home Living Family/patient expects to be discharged to:: Private residence Living Arrangements: Alone Available Help at Discharge: Family;Friend(s);Available PRN/intermittently                             Additional Comments: pt too lethargic to answer home  environment questions.       Prior Functioning/Environment Level of Independence: Needs assistance  Gait / Transfers Assistance Needed: pt mobilizing in w/c and performs SPT using Right leg (per chart). Pt unable to answer whether she uses other DME during her transfers.  ADL's / Homemaking Assistance Needed: Pt reports that she has an aide who comes daily for ~4-5 hours to assist with ADLs and IADLs. Pt too lethargic to give clear answers on full PLOF.        OT Diagnosis: Generalized weakness;Acute pain   OT Problem List: Decreased strength;Decreased range of motion;Decreased activity tolerance;Impaired balance (sitting and/or standing);Decreased safety awareness;Decreased knowledge of use of DME or AE;Decreased knowledge of precautions;Pain   OT Treatment/Interventions: Self-care/ADL training;Therapeutic exercise;Energy conservation;DME and/or AE instruction;Therapeutic activities;Patient/family education;Balance training    OT Goals(Current goals can be found in the care plan section) Acute Rehab OT Goals Patient Stated Goal: unable to state due to lethargy OT Goal Formulation: Patient unable to participate in goal setting Time For Goal Achievement: 06/17/14 Potential to Achieve Goals: Fair ADL Goals Pt Will Perform Grooming: with set-up;with supervision;sitting (EOB without UE support) Pt Will Transfer to Toilet: with min assist;with transfer board;bedside commode Pt Will Perform Toileting - Clothing Manipulation and hygiene: with min assist;sitting/lateral leans Additional ADL Goal #1: Pt will perform bed mobility with min (A) to prepare for ADLs.   OT Frequency: Min 2X/week   Barriers to D/C: Decreased caregiver support          Co-evaluation PT/OT/SLP Co-Evaluation/Treatment: Yes     OT goals addressed during session: ADL's and self-care      End of Session Equipment Utilized During Treatment: Right knee immobilizer Nurse Communication: Other (comment) (pt's  lethargy)  Activity Tolerance: Patient limited by lethargy Patient left: in bed;with call bell/phone within reach;with nursing/sitter in room   Time: 4356-8616 OT Time Calculation (min): 13 min Charges:  OT General Charges $OT Visit: 1 Procedure OT Evaluation $Initial OT Evaluation Tier I: 1 Procedure  Juluis Rainier 06/03/2014, 10:51 AM   Cyndie Chime, OTR/L Occupational Therapist [phone] (pager)

## 2014-06-03 NOTE — Significant Event (Signed)
Rapid Response Event Note  Overview: Time Called: 0719 Arrival Time: 0725 Event Type: Neurologic  Initial Focused Assessment:  Called by primary RN for patient lethargic.  Upon arrival to patients room Rn at bedside.  Patient lying flat in bed arouses to loud voice but drifts quickly back to sleep.  As per RN Patient stated she wanted to not be disturbed during the night and this am when checking vitals, BP noted to be SBP 67.  MD was notified and bolus ordered, On my arrival bolus almost finished SBP 99.  As per RN last received pain meds around 2230.     Interventions:  0.4 mg IV narcan given as protocol.  Patient more awake for a short while and then started to drift off quickly again.  Patient A&O x2.  Denies any pain, states she is just sleepy.  MD repaged and now at bedside.  Ordered another bolus to be given.  Patient repositioned in bed.   Event Summary:  RN to call if assistance needed.   at      at          [facility], Harlin Rain

## 2014-06-03 NOTE — Plan of Care (Signed)
Problem: Phase III Progression Outcomes Goal: Pain controlled on oral analgesia Outcome: Progressing     

## 2014-06-03 NOTE — Clinical Social Work Note (Signed)
Full assessment to follow once [facility] System available.  CSW received referral for possible SNF placement at time of discharge. CSW met with pt at bedside to discuss discharge disposition. Pt stated pt lives alone, but receives assistance from [place] with RN aids throughout the day. Pt understanding and agreeable to PT recommendation to SNF placement at time of discharge. Pt expressed understanding regarding [facility] CSW Raquel Sarna) to determine pt's discharge disposition. CSW has left message with [facility] CSW regarding information above.  [facility] [phone])  CSW to continue to follow and assist with discharge planning needs.  Lubertha Sayres, [place] [phone]) Licensed Clinical Social Worker Orthopedics [phone]) and Surgical [phone])

## 2014-06-03 NOTE — Progress Notes (Addendum)
  Date: 06/03/2014  Patient name: CATRICE ZULETA  Medical record number: [id number]  Date of birth: 12/01/1940   This patient has been seen and the plan of care was discussed with the house staff. Please see their note for complete details. I concur with their findings with the following additions/corrections: Ms Grimley is lethargic but will awaken with verbal commands and will stay alert for about 1 min before drifting off. She follows commands and moves RUE, LUE, LLE. RLE movement is limited but that is leg that is post-op. She properly ID'd when I was touching her R foot. Her sedation is currently attributed to opioid but seems to be taking a long time to clear. Other possibilities such as a CNS event, PE, delirium were considered but are less likely. Cont supportive care and if doesn;t clear, will need more W/U such as ABG to ensure she is ventilating and CT head.   Bartholomew Crews, MD 06/03/2014, 2:05 PM

## 2014-06-03 NOTE — ED Provider Notes (Signed)
CSN: [id number]     Arrival date & time 06/01/14  1602 History   First MD Initiated Contact with Patient 06/01/14 1622     Chief Complaint  Patient presents with  . Hip Injury     (Consider location/radiation/quality/duration/timing/severity/associated sxs/prior Treatment) Patient is a 73 y.o. female presenting with leg pain. The history is provided by the patient and a caregiver. No language interpreter was used.  Leg Pain Location:  Knee Injury: yes   Knee location:  R knee Pain details:    Duration:  2 days   Timing:  Constant Associated symptoms: no fever   Associated symptoms comment:  She arrives with [facility] care coordinator who reports that the patient fell 2 days prior suffering a twisting injury to the right knee. She has a history of MS and paraplegia secondary to MS. She fell while transferring to the toilet from her wheelchair. She continued to have pain in the knee area and imaging was obtained today showing a distal femur fracture. No other complaints.    Past Medical History  Diagnosis Date  . Hypertension   . Multiple sclerosis   . Diabetes mellitus without complication   . Neuromuscular disorder     Bilateral hand carpal tunnel syndrome  . Gait disturbance   . Obesity   . Spinal stenosis in cervical region   . Spinal stenosis of lumbosacral region   . Spinal stenosis, thoracic   . Degenerative arthritis    Past Surgical History  Procedure Laterality Date  . Replacement total knee bilateral  2004  . Abdominal hysterectomy    . Cholecystectomy    . Back surgery    . Left hand carpal tunnel surgery    . Cataract extraction Right    Family History  Problem Relation Age of Onset  . Multiple sclerosis Other     neices.   . Cancer Mother   . Diabetes Mother   . GI Bleed Sister     diverticulitis   History  Substance Use Topics  . Smoking status: Never Smoker   . Smokeless tobacco: Never Used  . Alcohol Use: No   OB History    No data available      Review of Systems  Constitutional: Negative for fever and chills.  HENT: Negative.   Respiratory: Negative.   Cardiovascular: Negative.   Gastrointestinal: Negative.   Musculoskeletal:       See HPI.  Skin: Negative.   Neurological: Negative.       Allergies  Codeine and Ultram  Home Medications   Prior to Admission medications   Medication Sig Start Date End Date Taking? Authorizing Provider  amLODipine (NORVASC) 5 MG tablet Take 5 mg by mouth at bedtime.    Yes Historical Provider, MD  atenolol (TENORMIN) 50 MG tablet Take 50 mg by mouth at bedtime.    Yes Historical Provider, MD  baclofen (LIORESAL) 10 MG tablet One tablet in the morning and midday, two tablets at night 07/07/13  Yes Kathrynn Ducking, MD  cholecalciferol (VITAMIN D) 1000 UNITS tablet Take 1,000 Units by mouth daily.   Yes Historical Provider, MD  Dimethyl Fumarate (TECFIDERA) 240 MG CPDR Take 240 mg by mouth 2 (two) times daily.   Yes Historical Provider, MD  DULoxetine (CYMBALTA) 30 MG capsule Take 30 mg by mouth daily. Take with 60mg  for a total of 90mg    Yes Historical Provider, MD  DULoxetine (CYMBALTA) 60 MG capsule Take 60 mg by mouth daily. Take with 30mg   for a total of 90mg    Yes Historical Provider, MD  gabapentin (NEURONTIN) 300 MG capsule Take 300 mg by mouth 3 (three) times daily.   Yes Historical Provider, MD  glimepiride (AMARYL) 2 MG tablet Take 2 mg by mouth 2 (two) times daily.   Yes Historical Provider, MD  insulin glargine (LANTUS) 100 UNIT/ML injection Inject 10 Units into the skin at bedtime.    Yes Historical Provider, MD  metFORMIN (GLUCOPHAGE) 1000 MG tablet Take 1,000 mg by mouth 2 (two) times daily with a meal.   Yes Historical Provider, MD  omeprazole (PRILOSEC) 20 MG capsule Take 20 mg by mouth daily.   Yes Historical Provider, MD  oxybutynin (DITROPAN) 5 MG tablet Take 5 mg by mouth 2 (two) times daily. 10/01/12  Yes Meredith Staggers, MD  predniSONE (DELTASONE) 5 MG tablet 1 tablet on  odd days, one half tablet on even days 07/07/13  Yes Kathrynn Ducking, MD  valsartan (DIOVAN) 320 MG tablet Take 320 mg by mouth at bedtime.    Yes Historical Provider, MD  vitamin B-12 (CYANOCOBALAMIN) 100 MCG tablet Take 100 mcg by mouth daily.   Yes Historical Provider, MD  enoxaparin (LOVENOX) 40 MG/0.4ML injection Inject 0.4 mLs (40 mg total) into the skin daily. 06/02/14   Johnny Bridge, MD  HYDROcodone-acetaminophen (NORCO) 10-325 MG per tablet Take 1-2 tablets by mouth every 6 (six) hours as needed. 06/02/14   Johnny Bridge, MD   BP 135/58 mmHg  Pulse 83  Temp(Src) 97.5 F (36.4 C) (Oral)  Resp 18  Ht 5' 1.81" (1.57 m)  Wt 169 lb (76.658 kg)  BMI 31.10 kg/m2  SpO2 100% Physical Exam  Constitutional: She is oriented to person, place, and time. She appears well-developed and well-nourished. No distress.  HENT:  Head: Normocephalic and atraumatic.  Neck: Normal range of motion.  Pulmonary/Chest: Effort normal.  Abdominal: Soft. There is no tenderness.  Musculoskeletal:  Right distal thigh and knee tenderness. No gross deformity. Distal pulses 2+. No calf or hip tenderness.   Neurological: She is alert and oriented to person, place, and time.  Skin: Skin is warm and dry.  Psychiatric: She has a normal mood and affect.    ED Course  Procedures (including critical care time) Labs Review Labs Reviewed  CBC WITH DIFFERENTIAL - Abnormal; Notable for the following:    WBC 13.5 (*)    Hemoglobin 11.9 (*)    Neutrophils Relative % 88 (*)    Neutro Abs 11.8 (*)    Lymphocytes Relative 5 (*)    All other components within normal limits  COMPREHENSIVE METABOLIC PANEL - Abnormal; Notable for the following:    Chloride 94 (*)    Glucose, Bld 269 (*)    Anion gap 23 (*)    All other components within normal limits  GLUCOSE, CAPILLARY - Abnormal; Notable for the following:    Glucose-Capillary 245 (*)    All other components within normal limits  SALICYLATE LEVEL - Abnormal;  Notable for the following:    Salicylate Lvl <6.9 (*)    All other components within normal limits  GLUCOSE, CAPILLARY - Abnormal; Notable for the following:    Glucose-Capillary 250 (*)    All other components within normal limits  GLUCOSE, CAPILLARY - Abnormal; Notable for the following:    Glucose-Capillary 103 (*)    All other components within normal limits  GLUCOSE, CAPILLARY - Abnormal; Notable for the following:    Glucose-Capillary 214 (*)  All other components within normal limits  SURGICAL PCR SCREEN  GLUCOSE, CAPILLARY  GLUCOSE, CAPILLARY  GLUCOSE, CAPILLARY  CBC  BASIC METABOLIC PANEL  TYPE AND SCREEN  ABO/RH    Imaging Review Dg Femur Right  06/02/2014   CLINICAL DATA:  Distal femur fracture, operative fixation  EXAM: RIGHT FEMUR - 2 VIEW; DG C-ARM 61-120 MIN  COMPARISON:  06/01/2014  FINDINGS: Spot fluoroscopic intraoperative views demonstrating right femur intra medullary rod insertion reducing the right distal femur metaphysis fracture above the knee replacement hardware. Anatomic alignment. Fracture lines remain visible. Bones are osteopenic.  IMPRESSION: Status post ORIF of the right distal femur fracture at the knee. No complicating feature.   Electronically Signed   By: Daryll Brod M.D.   On: 06/02/2014 15:51   Dg Knee 1-2 Views Right  06/01/2014   CLINICAL DATA:  Right knee pain in its entirety. Pt. Fell on right knee in bathroom at sons home on tile floor 3 days ago. Hx of right knee replacement in 2003.  EXAM: RIGHT KNEE - COMPLETE 4+ VIEW  COMPARISON:  None.  FINDINGS: Status post total knee arthroplasty. An oblique fracture of the distal femoral meta diaphysis with approximately 1/3 shaft with displacement of distal fracture fragment medially. Posttraumatic. Tibial component appears normal. Probable small suprapatellar joint effusion.  IMPRESSION: Oblique fracture of the distal femur, just cephalad to the femoral component of the total knee arthroplasty.   These results will be called to the ordering clinician or representative by the Radiologist Assistant, and communication documented in the PACS or zVision Dashboard.   Electronically Signed   By: Abigail Miyamoto M.D.   On: 06/01/2014 15:24   Dg Chest Portable 1 View  06/01/2014   CLINICAL DATA:  Preop evaluation.  EXAM: PORTABLE CHEST - 1 VIEW  COMPARISON:  July 24, 2012.  FINDINGS: Stable cardiomediastinal silhouette. No pneumothorax or pleural effusion is noted. Both lungs are clear. The visualized skeletal structures are unremarkable.  IMPRESSION: No acute cardiopulmonary abnormality seen.   Electronically Signed   By: Sabino Dick M.D.   On: 06/01/2014 16:55   Dg Femur Right Port  06/02/2014   CLINICAL DATA:  Post RIGHT femoral nailing  EXAM: PORTABLE RIGHT FEMUR - 2 VIEW  COMPARISON:  Portable exam 1702 hr compared intraoperative images of 06/02/2014 as well as preoperative images of 06/01/2014  FINDINGS: Long IM nail RIGHT femur with multiple distal locking screws.  RIGHT knee prosthesis again seen.  Distal femoral metaphyseal fracture identified on preoperative images appears significantly improved in alignment.  No new fracture, dislocation or bone destruction.  Artifacts from splint and a bag of ice are noted.  IMPRESSION: Post nailing of distal RIGHT femoral metaphyseal fracture.   Electronically Signed   By: Lavonia Dana M.D.   On: 06/02/2014 17:20   Dg C-arm 1-60 Min  06/02/2014   CLINICAL DATA:  Distal femur fracture, operative fixation  EXAM: RIGHT FEMUR - 2 VIEW; DG C-ARM 61-120 MIN  COMPARISON:  06/01/2014  FINDINGS: Spot fluoroscopic intraoperative views demonstrating right femur intra medullary rod insertion reducing the right distal femur metaphysis fracture above the knee replacement hardware. Anatomic alignment. Fracture lines remain visible. Bones are osteopenic.  IMPRESSION: Status post ORIF of the right distal femur fracture at the knee. No complicating feature.   Electronically  Signed   By: Daryll Brod M.D.   On: 06/02/2014 15:51     EKG Interpretation None      MDM   Final diagnoses:  Pre-op evaluation  Femur fracture, right, closed, initial encounter    Patient care was discussed with [facility] who reports that orthopedics have already been contacted and involved in patient care. Confirmed with [facility] that they will handle the admission for orthopedics - Dr. Mardelle Matte to perform surgery tomorrow.     Dewaine Oats, PA-C 06/03/14 [place], DO 06/03/14 0100

## 2014-06-03 NOTE — Evaluation (Signed)
Physical Therapy Evaluation Patient Details Name: Laura Roberts MRN: [id number] DOB: 11-25-1940 Today's Date: 06/03/2014   History of Present Illness  Laura Roberts is a 73 y.o. Female s/p Right IM nail for femur fx resulting from a fall at home during w/c transfer. PMH: HTN, MS, DM, Bil CTS, gait disturbance, obeity, spinal stenosis (cervical, lumbosacral, and thoracic), athritis. Surgical hx of Bil TKA (2004), back surgery, Left CTR, cataract extraction.  Clinical Impression  Patient is s/p above surgery due to fall at home resulting in functional limitations due to the deficits listed below (see PT Problem List). PTA pt was living alone with PACE of triad (A) ~4 hours daily with ADLs. Patient will benefit from skilled PT to increase their independence and safety with mobility to allow discharge to the venue listed below. Unable to assess transfers today secondary to lethargy. Evaluation limited to sitting bedside today.       Follow Up Recommendations SNF;Supervision/Assistance - 24 hour    Equipment Recommendations   (TBD)    Recommendations for Other Services OT consult     Precautions / Restrictions Precautions Precautions: Fall Required Braces or Orthoses: Knee Immobilizer - Right Knee Immobilizer - Right: Other (comment) Restrictions Weight Bearing Restrictions: Yes RLE Weight Bearing: Partial weight bearing RLE Partial Weight Bearing Percentage or Pounds: 50%      Mobility  Bed Mobility Overal bed mobility: +2 for physical assistance;Needs Assistance Bed Mobility: Supine to Sit;Sit to Supine     Supine to sit: Total assist;+2 for physical assistance Sit to supine: Total assist;+2 for physical assistance   General bed mobility comments: Due to lethargy, pt required +2 total (A) for all bed mobility.   Transfers                 General transfer comment: unsafe to assess at this time secondray to lethargy  Ambulation/Gait                 Stairs            Wheelchair Mobility    Modified Rankin (Stroke Patients Only)       Balance Overall balance assessment: Needs assistance;History of Falls Sitting-balance support: Feet supported;Bilateral upper extremity supported Sitting balance-Leahy Scale: Zero Sitting balance - Comments: Pt lethargic and unable to support self in EOB sitting.  Postural control: Posterior lean;Left lateral lean     Standing balance comment: unable to safely assess                             Pertinent Vitals/Pain Pain Assessment: No/denies pain    Home Living Family/patient expects to be discharged to:: Private residence Living Arrangements: Alone Available Help at Discharge: Family;Friend(s);Available PRN/intermittently Type of Home: House Home Access: Ramped entrance     Home Layout: One level   Additional Comments: pt too lethargic to answer all questions regarding PLOF and home setup; some PLOF taken from previous admissions    Prior Function Level of Independence: Needs assistance   Gait / Transfers Assistance Needed: pt mobilizing in w/c and performs SPT using Right leg (per chart). Pt unable to answer whether she uses other DME during her transfers.   ADL's / Homemaking Assistance Needed: Pt reports that she has an aide who comes daily for ~4-5 hours to assist with ADLs and IADLs. Pt too lethargic to give clear answers on full PLOF.        Hand Dominance  Extremity/Trunk Assessment   Upper Extremity Assessment: Defer to OT evaluation           Lower Extremity Assessment: Difficult to assess due to impaired cognition      Cervical / Trunk Assessment: Normal  Communication   Communication: No difficulties;Other (comment)  Cognition Arousal/Alertness: Lethargic;Suspect due to medications Behavior During Therapy: Flat affect Overall Cognitive Status: Difficult to assess                      General Comments General  comments (skin integrity, edema, etc.): Pt given Narcan during the night; pt very lethargic and only arousable for short periods of time. Pt opening eyes for sternal rubs for very brief periods and responding limited to questions     Exercises Low Level/ICU Exercises Ankle Circles/Pumps: PROM;Both;10 reps;Supine      Assessment/Plan    PT Assessment Patient needs continued PT services  PT Diagnosis Difficulty walking;Generalized weakness;Acute pain   PT Problem List Decreased strength;Decreased range of motion;Decreased mobility;Decreased balance;Decreased activity tolerance;Decreased cognition;Decreased safety awareness;Decreased knowledge of use of DME;Pain  PT Treatment Interventions DME instruction;Gait training;Therapeutic activities;Functional mobility training;Balance training;Therapeutic exercise;Neuromuscular re-education;Patient/family education   PT Goals (Current goals can be found in the Care Plan section) Acute Rehab PT Goals Patient Stated Goal: unable to state due to lethargy PT Goal Formulation: Patient unable to participate in goal setting Time For Goal Achievement: 06/10/14 Potential to Achieve Goals: Fair    Frequency Min 3X/week   Barriers to discharge Decreased caregiver support lives alone    Co-evaluation PT/OT/SLP Co-Evaluation/Treatment: Yes Reason for Co-Treatment: For patient/therapist safety PT goals addressed during session: Mobility/safety with mobility;Balance;Proper use of DME;Strengthening/ROM OT goals addressed during session: ADL's and self-care       End of Session Equipment Utilized During Treatment: Right knee immobilizer;Oxygen Activity Tolerance: Patient limited by lethargy Patient left: in bed;with call bell/phone within reach;with bed alarm set;with nursing/sitter in room Nurse Communication: Mobility status;Weight bearing status;Precautions         Time: 6553-7482 PT Time Calculation (min) (ACUTE ONLY): 13 min   Charges:    PT Evaluation $Initial PT Evaluation Tier I: 1 Procedure     PT G CodesGustavus Roberts, Laura Roberts 06/03/2014, 11:15 AM

## 2014-06-03 NOTE — Progress Notes (Signed)
Patient ID: Laura Roberts, female   DOB: 07/18/1940, 73 y.o.   MRN: [id number]     Subjective:  Patient reports pain as mild to moderate.  Denies any CP or SOB  Objective:   VITALS:   Filed Vitals:   06/03/14 0620 06/03/14 0710 06/03/14 1045 06/03/14 1527  BP: 75/47 99/50 81/57  109/94  Pulse:  67  84  Temp:    98 F (36.7 C)  TempSrc:      Resp:    14  Height:      Weight:      SpO2:  97%  98%    ABD soft Sensation intact distally Dorsiflexion/Plantar flexion intact Incision: dressing C/D/I and no drainage Good foot and ankle motion  Lab Results  Component Value Date   WBC 12.4* 06/03/2014   HGB 10.1* 06/03/2014   HCT 31.5* 06/03/2014   MCV 90.8 06/03/2014   PLT 225 06/03/2014   BMET    Component Value Date/Time   NA 135* 06/03/2014 0530   NA 139 01/05/2014 1406   K 4.2 06/03/2014 0530   CL 99 06/03/2014 0530   CO2 23 06/03/2014 0530   GLUCOSE 193* 06/03/2014 0530   GLUCOSE 170* 01/05/2014 1406   BUN 15 06/03/2014 0530   BUN 19 01/05/2014 1406   CREATININE 0.82 06/03/2014 0530   CALCIUM 8.7 06/03/2014 0530   GFRNONAA 70* 06/03/2014 0530   GFRAA 81* 06/03/2014 0530     Assessment/Plan: 1 Day Post-Op   Principal Problem:   Closed supracondylar fracture of right femur, periprosthetic Active Problems:   Multiple sclerosis   HTN (hypertension)   DM (diabetes mellitus)   Paraplegia   Femoral fracture   Advance diet Up with therapy Continue plan per medicine NWB right lower ext Dry dressing PRN   DOUGLAS PARRY, BRANDON 06/03/2014, 4:32 PM  Seen and agree with above.   Marchia Bond, MD Cell [phone]

## 2014-06-03 NOTE — Progress Notes (Signed)
Subjective: Had IM retrograde femoral nailing right supracondylar periprosthetic femur fracture. Patient's BP was low this AM as low as 75/47, got 2x 589ml bolus and it came back to 99/50. Patient has been somnolent, received 1x narcan with some improvement of mental status. Off note, got 2x norco tablets 10:30 pm last night.  Rapid response team was called. Patient still remains somnolent but wakes up and answers questions appropriately. Breathing and protecting airway fine. No neuro deficits.   Denies any pain, sob, chest pain, n/v, headache, or any other complaints.  Objective: Vital signs in last 24 hours: Filed Vitals:   06/02/14 1948 06/03/14 0556 06/03/14 0620 06/03/14 0710  BP: 135/58  75/47 99/50  Pulse: 83 68  67  Temp: 97.5 F (36.4 C) 97.7 F (36.5 C)    TempSrc: Oral Axillary    Resp: 18 17    Height:      Weight:      SpO2: 100% 100%  97%   Weight change:   Intake/Output Summary (Last 24 hours) at 06/03/14 1114 Last data filed at 06/03/14 0700  Gross per 24 hour  Intake   3660 ml  Output    100 ml  Net   3560 ml   Vitals reviewed. General: resting in bed, somnolent, wakes up to question and answers appropriately  HEENT: PERRL, EOMI, no scleral icterus Cardiac: RRR, no rubs, murmurs or gallops Pulm: clear to auscultation bilaterally, no wheezes, rales, or rhonchi Abd: soft, nontender, nondistended, BS present Ext: right leg has TTP throughout, is wearing knee immobilizer. Left leg normal. No pain. Neuro: somnolent but oriented X3, cranial nerves II-XII grossly intact, strength and sensation to light touch equal in bilateral upper and lower left ext. Right lower ext limited rom because of the knee immobilizer.   Lab Results: Basic Metabolic Panel:  Recent Labs Lab 06/01/14 1653 06/03/14 0530  NA 138 135*  K 4.7 4.2  CL 94* 99  CO2 21 23  GLUCOSE 269* 193*  BUN 16 15  CREATININE 0.58 0.82  CALCIUM 9.9 8.7   Liver Function Tests:  Recent Labs Lab  06/01/14 1653  AST 18  ALT 15  ALKPHOS 62  BILITOT 1.2  PROT 6.8  ALBUMIN 3.6   No results for input(s): LIPASE, AMYLASE in the last 168 hours. No results for input(s): AMMONIA in the last 168 hours. CBC:  Recent Labs Lab 06/01/14 1653 06/03/14 0530  WBC 13.5* 12.4*  NEUTROABS 11.8*  --   HGB 11.9* 10.1*  HCT 36.7 31.5*  MCV 89.5 90.8  PLT 237 225   Cardiac Enzymes: No results for input(s): CKTOTAL, CKMB, CKMBINDEX, TROPONINI in the last 168 hours. BNP: No results for input(s): PROBNP in the last 168 hours. D-Dimer: No results for input(s): DDIMER in the last 168 hours. CBG:  Recent Labs Lab 06/02/14 0916 06/02/14 1208 06/02/14 1522 06/02/14 2049 06/03/14 0416 06/03/14 0619  GLUCAP 81 78 89 214* 196* 177*   Hemoglobin A1C: No results for input(s): HGBA1C in the last 168 hours. Fasting Lipid Panel: No results for input(s): CHOL, HDL, LDLCALC, TRIG, CHOLHDL, LDLDIRECT in the last 168 hours. Thyroid Function Tests: No results for input(s): TSH, T4TOTAL, FREET4, T3FREE, THYROIDAB in the last 168 hours. Coagulation: No results for input(s): LABPROT, INR in the last 168 hours. Anemia Panel: No results for input(s): VITAMINB12, FOLATE, FERRITIN, TIBC, IRON, RETICCTPCT in the last 168 hours. Urine Drug Screen: Drugs of Abuse  No results found for: LABOPIA, COCAINSCRNUR, [place], [place], [place],  LABBARB  Alcohol Level: No results for input(s): ETH in the last 168 hours. Urinalysis: No results for input(s): COLORURINE, LABSPEC, PHURINE, GLUCOSEU, HGBUR, BILIRUBINUR, KETONESUR, PROTEINUR, UROBILINOGEN, NITRITE, LEUKOCYTESUR in the last 168 hours.  Invalid input(s): APPERANCEUR Misc. Labs:  Micro Results: Recent Results (from the past 240 hour(s))  Surgical PCR screen     Status: None   Collection Time: 06/01/14 10:22 PM  Result Value Ref Range Status   MRSA, PCR NEGATIVE NEGATIVE Final   Staphylococcus aureus NEGATIVE NEGATIVE Final    Comment:        The  Xpert SA Assay (FDA approved for NASAL specimens in patients over 35 years of age), is one component of a comprehensive surveillance program.  Test performance has been validated by [employer] for patients greater than or equal to 68 year old. It is not intended to diagnose infection nor to guide or monitor treatment.    Studies/Results: Dg Femur Right  06-05-14   CLINICAL DATA:  Distal femur fracture, operative fixation  EXAM: RIGHT FEMUR - 2 VIEW; DG C-ARM 61-120 MIN  COMPARISON:  06/01/2014  FINDINGS: Spot fluoroscopic intraoperative views demonstrating right femur intra medullary rod insertion reducing the right distal femur metaphysis fracture above the knee replacement hardware. Anatomic alignment. Fracture lines remain visible. Bones are osteopenic.  IMPRESSION: Status post ORIF of the right distal femur fracture at the knee. No complicating feature.   Electronically Signed   By: Daryll Brod M.D.   On: 06/05/14 15:51   Dg Knee 1-2 Views Right  06/01/2014   CLINICAL DATA:  Right knee pain in its entirety. Pt. Fell on right knee in bathroom at sons home on tile floor 3 days ago. Hx of right knee replacement in 2003.  EXAM: RIGHT KNEE - COMPLETE 4+ VIEW  COMPARISON:  None.  FINDINGS: Status post total knee arthroplasty. An oblique fracture of the distal femoral meta diaphysis with approximately 1/3 shaft with displacement of distal fracture fragment medially. Posttraumatic. Tibial component appears normal. Probable small suprapatellar joint effusion.  IMPRESSION: Oblique fracture of the distal femur, just cephalad to the femoral component of the total knee arthroplasty.  These results will be called to the ordering clinician or representative by the Radiologist Assistant, and communication documented in the PACS or zVision Dashboard.   Electronically Signed   By: Abigail Miyamoto M.D.   On: 06/01/2014 15:24   Dg Chest Portable 1 View  06/01/2014   CLINICAL DATA:  Preop evaluation.  EXAM:  PORTABLE CHEST - 1 VIEW  COMPARISON:  July 24, 2012.  FINDINGS: Stable cardiomediastinal silhouette. No pneumothorax or pleural effusion is noted. Both lungs are clear. The visualized skeletal structures are unremarkable.  IMPRESSION: No acute cardiopulmonary abnormality seen.   Electronically Signed   By: Sabino Dick M.D.   On: 06/01/2014 16:55   Dg Femur Right Port  2014-06-05   CLINICAL DATA:  Post RIGHT femoral nailing  EXAM: PORTABLE RIGHT FEMUR - 2 VIEW  COMPARISON:  Portable exam 1702 hr compared intraoperative images of 2014/06/05 as well as preoperative images of 06/01/2014  FINDINGS: Long IM nail RIGHT femur with multiple distal locking screws.  RIGHT knee prosthesis again seen.  Distal femoral metaphyseal fracture identified on preoperative images appears significantly improved in alignment.  No new fracture, dislocation or bone destruction.  Artifacts from splint and a bag of ice are noted.  IMPRESSION: Post nailing of distal RIGHT femoral metaphyseal fracture.   Electronically Signed   By: Crist Infante.D.  On: 06/02/2014 17:20   Dg C-arm 1-60 Min  06/02/2014   CLINICAL DATA:  Distal femur fracture, operative fixation  EXAM: RIGHT FEMUR - 2 VIEW; DG C-ARM 61-120 MIN  COMPARISON:  06/01/2014  FINDINGS: Spot fluoroscopic intraoperative views demonstrating right femur intra medullary rod insertion reducing the right distal femur metaphysis fracture above the knee replacement hardware. Anatomic alignment. Fracture lines remain visible. Bones are osteopenic.  IMPRESSION: Status post ORIF of the right distal femur fracture at the knee. No complicating feature.   Electronically Signed   By: Daryll Brod M.D.   On: 06/02/2014 15:51   Medications: I have reviewed the patient's current medications. Scheduled Meds: . amLODipine  5 mg Oral QHS  . baclofen  10 mg Oral Q24H  . baclofen  10 mg Oral Q24H  . baclofen  20 mg Oral QHS  . cholecalciferol  1,000 Units Oral Daily  . Dimethyl Fumarate   240 mg Oral BID  . docusate sodium  100 mg Oral BID  . DULoxetine  90 mg Oral Daily  . enoxaparin (LOVENOX) injection  40 mg Subcutaneous Q24H  . insulin aspart  0-9 Units Subcutaneous 6 times per day  . insulin glargine  5 Units Subcutaneous QHS  . irbesartan  300 mg Oral Daily  . oxybutynin  5 mg Oral BID  . pantoprazole  40 mg Oral Daily  . predniSONE  7.5 mg Oral Q48H  . senna  1 tablet Oral BID  . sodium chloride  500 mL Intravenous Once  . sodium chloride  500 mL Intravenous Once   Continuous Infusions: . sodium chloride 100 mL/hr at 06/03/14 0419  . lactated ringers     PRN Meds:.acetaminophen **OR** acetaminophen, alum & mag hydroxide-simeth, bisacodyl, menthol-cetylpyridinium **OR** phenol, naLOXone (NARCAN)  injection, ondansetron **OR** ondansetron (ZOFRAN) IV, polyethylene glycol Assessment/Plan: Principal Problem:   Closed supracondylar fracture of right femur, periprosthetic Active Problems:   Multiple sclerosis   HTN (hypertension)   DM (diabetes mellitus)   Paraplegia   Femoral fracture 73 yo female with MS, HTN, DM II, b/l knee replacement here with right periprosthetic femur fracture.  AMS - patient was doing well yesterday after the procedure. Her BP was normal and she was able to tolerate diet. She is very somnolent this morning with low BP in 70's. Did receive 2x norco last night 10:30 PM. She is opiate naive so this may be the cause of her somnolence al though the time period is too long to still have somnolence from norco. Did improve somewhat with 1x narcan. norco could still be somewhat in her system. Will watch her for now expecting improvement as norco keeps clearing. - holding all sedative meds including opiates, neurontin. - if not improving, will get CT head and do other workup.   Right femur fracture, periprosthetic -POD #1 from  IM retrograde femoral nailing of right supracondylar periprosthetic femur fracture. s/p fall at home during transferring  from wheel chair to commode.  - pain well controlled. D/ced all opiates and only kept tylenol for now because of her somnolence. - PT/ot working with her and recommended SNF. sw consulted for placement.   HTN - takes amlodipine 5mg , atenolol 50, valsartan 320 home regimen - as BP low this morning and had to bolus to bring it up, holding all anti-hypertensives. -restart as BP tolerates.  MS - followed by [facility] neuro. is on prednisone + tecifedera  - baclofen for spasm   DM II - hgba1c 7.6 on 07/21/12. Takes  glimepiride 2 mg BID, lantus 10 units qhs and metformin 1000mg  BID at home - lantus 5 units qhs + SSI here.  Diet: advance diet as tolerating.  DVT: lovenox.   Dispo: Disposition is deferred at this time, awaiting improvement of current medical problems.  Anticipated discharge in approximately 1-2 day(s).   The patient does have a current PCP [name], MD) and does need an [facility] hospital follow-up appointment after discharge.  The patient does have transportation limitations that hinder transportation to clinic appointments.  .Services Needed at time of discharge: Y = Yes, Blank = No PT:   OT:   RN:   Equipment:   Other:     LOS: 2 days   Dellia Nims, MD 06/03/2014, 11:14 AM

## 2014-06-04 LAB — BASIC METABOLIC PANEL
ANION GAP: 14 (ref 5–15)
BUN: 9 mg/dL (ref 6–23)
CALCIUM: 9.3 mg/dL (ref 8.4–10.5)
CO2: 23 mEq/L (ref 19–32)
CREATININE: 0.51 mg/dL (ref 0.50–1.10)
Chloride: 104 mEq/L (ref 96–112)
GFR calc non Af Amer: >90 mL/min (ref >90)
Glucose, Bld: 108 mg/dL — ABNORMAL HIGH (ref 70–99)
Potassium: 3.9 mEq/L (ref 3.7–5.3)
Sodium: 141 mEq/L (ref 137–147)

## 2014-06-04 LAB — GLUCOSE, CAPILLARY
Glucose-Capillary: 132 mg/dL — ABNORMAL HIGH (ref 70–99)
Glucose-Capillary: 81 mg/dL (ref 70–99)
Glucose-Capillary: 186 mg/dL — ABNORMAL HIGH (ref 70–99)
Glucose-Capillary: 180 mg/dL — ABNORMAL HIGH (ref 70–99)

## 2014-06-04 LAB — CBC
HEMATOCRIT: 30.9 % — AB (ref 36.0–46.0)
Hemoglobin: 9.9 g/dL — ABNORMAL LOW (ref 12.0–15.0)
MCH: 28.6 pg (ref 26.0–34.0)
MCHC: 32 g/dL (ref 30.0–36.0)
MCV: 89.3 fL (ref 78.0–100.0)
PLATELETS: 220 10*3/uL (ref 150–400)
RBC: 3.46 MIL/uL — ABNORMAL LOW (ref 3.87–5.11)
RDW: 14.4 % (ref 11.5–15.5)
WBC: 11 10*3/uL — ABNORMAL HIGH (ref 4.0–10.5)

## 2014-06-04 NOTE — Progress Notes (Signed)
  Date: 06/04/2014  Patient name: Laura Roberts  Medical record number: [id number]  Date of birth: 11-28-1940   This patient has been seen and the plan of care was discussed with the house staff. Please see their note for complete details. I concur with their findings with the following additions/corrections: Looks great today and back to baseline. To work with PT. Possible SNF placement temp and earliest likely would be Monday - pt aware of plans.   Bartholomew Crews, MD 06/04/2014, 2:24 PM

## 2014-06-04 NOTE — Progress Notes (Signed)
Subjective: Doing well today. Pain on right leg controlled. Denies any pain, sob, chest pain, n/v, headache, or any other complaints.  Objective: Vital signs in last 24 hours: Filed Vitals:   06/04/14 0541 06/04/14 0800 06/04/14 1159 06/04/14 1331  BP: 112/56   132/79  Pulse: 79   93  Temp: 98 F (36.7 C)   97.8 F (36.6 C)  TempSrc: Oral   Axillary  Resp: 17 18 18 18   Height:      Weight:      SpO2: 98% 99% 99% 94%   Weight change:   Intake/Output Summary (Last 24 hours) at 06/04/14 1457 Last data filed at 06/04/14 0800  Gross per 24 hour  Intake    440 ml  Output      0 ml  Net    440 ml   Vitals reviewed. General: resting in bed, NAD HEENT: PERRL, EOMI, no scleral icterus Cardiac: RRR, no rubs, murmurs or gallops Pulm: clear to auscultation bilaterally, no wheezes, rales, or rhonchi Abd: soft, nontender, nondistended, BS present Ext: right leg has TTP throughout, is wearing knee immobilizer. Left leg normal. No pain. Neuro: somnolent but oriented X3, cranial nerves II-XII grossly intact, strength and sensation to light touch equal in bilateral upper and lower left ext. Right lower ext limited rom because of the knee immobilizer.   Lab Results: Basic Metabolic Panel:  Recent Labs Lab 06/03/14 0530 06/04/14 0648  NA 135* 141  K 4.2 3.9  CL 99 104  CO2 23 23  GLUCOSE 193* 108*  BUN 15 9  CREATININE 0.82 0.51  CALCIUM 8.7 9.3   Liver Function Tests:  Recent Labs Lab 06/01/14 1653  AST 18  ALT 15  ALKPHOS 62  BILITOT 1.2  PROT 6.8  ALBUMIN 3.6   No results for input(s): LIPASE, AMYLASE in the last 168 hours. No results for input(s): AMMONIA in the last 168 hours. CBC:  Recent Labs Lab 06/01/14 1653 06/03/14 0530 06/04/14 0648  WBC 13.5* 12.4* 11.0*  NEUTROABS 11.8*  --   --   HGB 11.9* 10.1* 9.9*  HCT 36.7 31.5* 30.9*  MCV 89.5 90.8 89.3  PLT 237 225 220   Cardiac Enzymes: No results for input(s): CKTOTAL, CKMB, CKMBINDEX, TROPONINI  in the last 168 hours. BNP: No results for input(s): PROBNP in the last 168 hours. D-Dimer: No results for input(s): DDIMER in the last 168 hours. CBG:  Recent Labs Lab 06/03/14 0619 06/03/14 1229 06/03/14 1654 06/03/14 2156 06/04/14 0650 06/04/14 1211  GLUCAP 177* 116* 161* 179* 132* 81   Hemoglobin A1C: No results for input(s): HGBA1C in the last 168 hours. Fasting Lipid Panel: No results for input(s): CHOL, HDL, LDLCALC, TRIG, CHOLHDL, LDLDIRECT in the last 168 hours. Thyroid Function Tests: No results for input(s): TSH, T4TOTAL, FREET4, T3FREE, THYROIDAB in the last 168 hours. Coagulation: No results for input(s): LABPROT, INR in the last 168 hours. Anemia Panel: No results for input(s): VITAMINB12, FOLATE, FERRITIN, TIBC, IRON, RETICCTPCT in the last 168 hours. Urine Drug Screen: Drugs of Abuse  No results found for: LABOPIA, COCAINSCRNUR, LABBENZ, AMPHETMU, THCU, LABBARB  Alcohol Level: No results for input(s): ETH in the last 168 hours. Urinalysis: No results for input(s): COLORURINE, LABSPEC, PHURINE, GLUCOSEU, HGBUR, BILIRUBINUR, KETONESUR, PROTEINUR, UROBILINOGEN, NITRITE, LEUKOCYTESUR in the last 168 hours.  Invalid input(s): APPERANCEUR Misc. Labs:  Micro Results: Recent Results (from the past 240 hour(s))  Surgical PCR screen     Status: None   Collection Time: 06/01/14  10:22 PM  Result Value Ref Range Status   MRSA, PCR NEGATIVE NEGATIVE Final   Staphylococcus aureus NEGATIVE NEGATIVE Final    Comment:        The Xpert SA Assay (FDA approved for NASAL specimens in patients over 51 years of age), is one component of a comprehensive surveillance program.  Test performance has been validated by [employer] for patients greater than or equal to 25 year old. It is not intended to diagnose infection nor to guide or monitor treatment.    Studies/Results: Dg Femur Right Port  06/02/2014   CLINICAL DATA:  Post RIGHT femoral nailing  EXAM: PORTABLE  RIGHT FEMUR - 2 VIEW  COMPARISON:  Portable exam 1702 hr compared intraoperative images of 06/02/2014 as well as preoperative images of 06/01/2014  FINDINGS: Long IM nail RIGHT femur with multiple distal locking screws.  RIGHT knee prosthesis again seen.  Distal femoral metaphyseal fracture identified on preoperative images appears significantly improved in alignment.  No new fracture, dislocation or bone destruction.  Artifacts from splint and a bag of ice are noted.  IMPRESSION: Post nailing of distal RIGHT femoral metaphyseal fracture.   Electronically Signed   By: Lavonia Dana M.D.   On: 06/02/2014 17:20   Medications: I have reviewed the patient's current medications. Scheduled Meds: . baclofen  10 mg Oral Q24H  . baclofen  10 mg Oral Q24H  . baclofen  20 mg Oral QHS  . cholecalciferol  1,000 Units Oral Daily  . Dimethyl Fumarate  240 mg Oral BID  . docusate sodium  100 mg Oral BID  . DULoxetine  90 mg Oral Daily  . enoxaparin (LOVENOX) injection  40 mg Subcutaneous Q24H  . insulin aspart  0-9 Units Subcutaneous 6 times per day  . insulin glargine  5 Units Subcutaneous QHS  . oxybutynin  5 mg Oral BID  . pantoprazole  40 mg Oral Daily  . predniSONE  7.5 mg Oral Q48H  . senna  1 tablet Oral BID  . sodium chloride  500 mL Intravenous Once   Continuous Infusions:   PRN Meds:.acetaminophen **OR** acetaminophen, alum & mag hydroxide-simeth, bisacodyl, menthol-cetylpyridinium **OR** phenol, naLOXone (NARCAN)  injection, ondansetron **OR** ondansetron (ZOFRAN) IV, polyethylene glycol Assessment/Plan: Principal Problem:   Closed supracondylar fracture of right femur, periprosthetic Active Problems:   Multiple sclerosis   HTN (hypertension)   DM (diabetes mellitus)   Paraplegia   Femoral fracture 73 yo female with MS, HTN, DM II, b/l knee replacement here with right periprosthetic femur fracture.  Right femur fracture, periprosthetic -POD #2 from  IM retrograde femoral nailing of right  supracondylar periprosthetic femur fracture. s/p fall at home during transferring from wheel chair to commode.  - pain well controlled. D/ced all opiates and only kept tylenol for now because of her somnolence. - PT/ot working with her and recommended SNF. sw consulted for placement.   AMS - likely from opiates. Improved. Avoid opiate.  HTN - takes amlodipine 5mg , atenolol 50, valsartan 320 home regimen - as BP low this morning and had to bolus to bring it up, holding all anti-hypertensives. -restart as BP tolerates.  MS - followed by [facility] neuro. is on prednisone + tecifedera  - baclofen for spasm  DM II - hgba1c 7.6 on 07/21/12. Takes glimepiride 2 mg BID, lantus 10 units qhs and metformin 1000mg  BID at home - lantus 5 units qhs + SSI here.  Diet: advance diet as tolerating.  DVT: lovenox.   Dispo: Disposition is  deferred at this time, awaiting improvement of current medical problems.  Anticipated discharge in approximately 1-2 day(s).   The patient does have a current PCP [name], MD) and does need an [facility] hospital follow-up appointment after discharge.  The patient does have transportation limitations that hinder transportation to clinic appointments.  .Services Needed at time of discharge: Y = Yes, Blank = No PT:   OT:   RN:   Equipment:   Other:     LOS: 3 days   Dellia Nims, MD 06/04/2014, 2:57 PM

## 2014-06-04 NOTE — Plan of Care (Signed)
Problem: Phase III Progression Outcomes Goal: Activity at appropriate level-compared to baseline (UP IN CHAIR FOR HEMODIALYSIS)  Outcome: Progressing Goal: Incision clean - minimal/no drainage Outcome: Progressing

## 2014-06-04 NOTE — Progress Notes (Signed)
Subjective: 2 Days Post-Op Procedure(s) (LRB): INTRAMEDULLARY (IM) RETROGRADE FEMORAL NAILING (Right) Patient reports pain as mild and moderate.    Objective: Vital signs in last 24 hours: Temp:  [97.8 F (36.6 C)-98 F (36.7 C)] 98 F (36.7 C) (12/05 0541) Pulse Rate:  [77-84] 79 (12/05 0541) Resp:  [14-18] 18 (12/05 0800) BP: (102-112)/(56-94) 112/56 mmHg (12/05 0541) SpO2:  [96 %-99 %] 99 % (12/05 0800)  Intake/Output from previous day: 12/04 0701 - 12/05 0700 In: 540 [P.O.:540] Out: -  Intake/Output this shift:     Recent Labs  06/01/14 1653 06/03/14 0530 06/04/14 0648  HGB 11.9* 10.1* 9.9*    Recent Labs  06/03/14 0530 06/04/14 0648  WBC 12.4* 11.0*  RBC 3.47* 3.46*  HCT 31.5* 30.9*  PLT 225 220    Recent Labs  06/03/14 0530 06/04/14 0648  NA 135* 141  K 4.2 3.9  CL 99 104  CO2 23 23  BUN 15 9  CREATININE 0.82 0.51  GLUCOSE 193* 108*  CALCIUM 8.7 9.3   No results for input(s): LABPT, INR in the last 72 hours.  Neurovascular intact Sensation intact distally Intact pulses distally Dorsiflexion/Plantar flexion intact Incision: dressing C/D/I Compartment soft  Assessment/Plan: 2 Days Post-Op Procedure(s) (LRB): INTRAMEDULLARY (IM) RETROGRADE FEMORAL NAILING (Right)  Principal Problem:  Closed supracondylar fracture of right femur, periprosthetic Active Problems:  Multiple sclerosis  HTN (hypertension)  DM (diabetes mellitus)  Paraplegia  Femoral fracture   Advance diet Up with therapy Continue plan per medicine NWB right lower ext Dry dressing PRN  Laura Roberts 06/04/2014, 11:45 AM

## 2014-06-05 LAB — CBC
HCT: 30 % — ABNORMAL LOW (ref 36.0–46.0)
HEMOGLOBIN: 9.6 g/dL — AB (ref 12.0–15.0)
MCH: 28.3 pg (ref 26.0–34.0)
MCHC: 32 g/dL (ref 30.0–36.0)
MCV: 88.5 fL (ref 78.0–100.0)
PLATELETS: 249 10*3/uL (ref 150–400)
RBC: 3.39 MIL/uL — ABNORMAL LOW (ref 3.87–5.11)
RDW: 14.6 % (ref 11.5–15.5)
WBC: 9.2 10*3/uL (ref 4.0–10.5)

## 2014-06-05 LAB — GLUCOSE, CAPILLARY
GLUCOSE-CAPILLARY: 176 mg/dL — AB (ref 70–99)
Glucose-Capillary: 163 mg/dL — ABNORMAL HIGH (ref 70–99)
Glucose-Capillary: 71 mg/dL (ref 70–99)
Glucose-Capillary: 156 mg/dL — ABNORMAL HIGH (ref 70–99)
Glucose-Capillary: 222 mg/dL — ABNORMAL HIGH (ref 70–99)

## 2014-06-05 NOTE — Progress Notes (Signed)
Subjective:   Day of hospitalization: 4  VSS.  No overnight events.  Pt is sitting in the chair this AM with 4-5 pain that is "manageable."  Sleeping and eating well.  No other complaints.     Objective:   Vital signs in last 24 hours: Filed Vitals:   06/04/14 2100 06/05/14 0000 06/05/14 0400 06/05/14 0615  BP: 150/86   149/77  Pulse: 99   92  Temp: 98.5 F (36.9 C)   98.6 F (37 C)  TempSrc:    Oral  Resp: 16 16 16 18   Height:      Weight:      SpO2: 98% 98% 98% 100%    Weight: Filed Weights   06/01/14 1910  Weight: 169 lb (76.658 kg)    I/Os: No intake or output data in the 24 hours ending 06/05/14 0802  Physical Exam: Constitutional: Vital signs reviewed.  Patient is sitting up in the chair in no acute distress and cooperative with exam.   HEENT: [place]/AT; PERRL, EOMI, conjunctivae normal, no scleral icterus  Cardiovascular: RRR, no MRG Pulmonary/Chest: normal respiratory effort, no accessory muscle use, CTAB, no wheezes, rales, or rhonchi Abdominal: Soft. +BS, NT/ND Neurological: A&O x3, CN II-XII grossly intact; non-focal exam Extremities: 2+DP b/l, no C/C/E  Skin: Warm, dry and intact.  Lab Results:  BMP:  Recent Labs Lab 06/03/14 0530 06/04/14 0648  NA 135* 141  K 4.2 3.9  CL 99 104  CO2 23 23  GLUCOSE 193* 108*  BUN 15 9  CREATININE 0.82 0.51  CALCIUM 8.7 9.3   CBC:  Recent Labs Lab 06/01/14 1653  06/04/14 0648 06/05/14 0459  WBC 13.5*  < > 11.0* 9.2  NEUTROABS 11.8*  --   --   --   HGB 11.9*  < > 9.9* 9.6*  HCT 36.7  < > 30.9* 30.0*  MCV 89.5  < > 89.3 88.5  PLT 237  < > 220 249  < > = values in this interval not displayed.  Coagulation: No results for input(s): LABPROT, INR in the last 168 hours.  CBG:            Recent Labs Lab 06/04/14 0650 06/04/14 1211 06/04/14 1658 06/04/14 2116 06/04/14 2359 06/05/14 0553  GLUCAP 132* 81 186* 180* 163* 71           HA1C:      No results for input(s): HGBA1C in the last 168  hours.  Lipid Panel: No results for input(s): CHOL, HDL, LDLCALC, TRIG, CHOLHDL, LDLDIRECT in the last 168 hours.  LFTs:  Recent Labs Lab 06/01/14 1653  AST 18  ALT 15  ALKPHOS 62  BILITOT 1.2  PROT 6.8  ALBUMIN 3.6   Urinalysis: No results for input(s): COLORURINE, LABSPEC, PHURINE, GLUCOSEU, HGBUR, BILIRUBINUR, KETONESUR, PROTEINUR, UROBILINOGEN, NITRITE, LEUKOCYTESUR in the last 168 hours.  Invalid input(s): APPERANCEUR  Micro Results: Recent Results (from the past 240 hour(s))  Surgical PCR screen     Status: None   Collection Time: 06/01/14 10:22 PM  Result Value Ref Range Status   MRSA, PCR NEGATIVE NEGATIVE Final   Staphylococcus aureus NEGATIVE NEGATIVE Final    Comment:        The Xpert SA Assay (FDA approved for NASAL specimens in patients over 74 years of age), is one component of a comprehensive surveillance program.  Test performance has been validated by [employer] for patients greater than or equal to 12 year old. It is not intended  to diagnose infection nor to guide or monitor treatment.     Blood Culture:    Component Value Date/Time   SDES URINE, CLEAN CATCH 08/04/2012 1938   SPECREQUEST NONE 08/04/2012 1938   CULT  08/04/2012 1938    STAPHYLOCOCCUS SPECIES (COAGULASE NEGATIVE) Note: RIFAMPIN AND GENTAMICIN SHOULD NOT BE USED AS SINGLE DRUGS FOR TREATMENT OF STAPH INFECTIONS.   REPTSTATUS 08/06/2012 FINAL 08/04/2012 1938    Studies/Results: No results found.  Medications:  Scheduled Meds: . baclofen  10 mg Oral Q24H  . baclofen  10 mg Oral Q24H  . baclofen  20 mg Oral QHS  . cholecalciferol  1,000 Units Oral Daily  . Dimethyl Fumarate  240 mg Oral BID  . docusate sodium  100 mg Oral BID  . DULoxetine  90 mg Oral Daily  . enoxaparin (LOVENOX) injection  40 mg Subcutaneous Q24H  . insulin aspart  0-9 Units Subcutaneous 6 times per day  . insulin glargine  5 Units Subcutaneous QHS  . oxybutynin  5 mg Oral BID  . pantoprazole  40  mg Oral Daily  . predniSONE  7.5 mg Oral Q48H  . senna  1 tablet Oral BID  . sodium chloride  500 mL Intravenous Once   Continuous Infusions:  PRN Meds: acetaminophen **OR** acetaminophen, alum & mag hydroxide-simeth, bisacodyl, menthol-cetylpyridinium **OR** phenol, naLOXone (NARCAN)  injection, ondansetron **OR** ondansetron (ZOFRAN) IV, polyethylene glycol  Antibiotics: Antibiotics Given (last 72 hours)    Date/Time Action Medication Dose Rate   06/02/14 1330 Given   ceFAZolin (ANCEF) IVPB 2 g/50 mL premix 2 g    06/02/14 1945 Given   ceFAZolin (ANCEF) IVPB 2 g/50 mL premix 2 g 100 mL/hr   06/03/14 0145 Given   ceFAZolin (ANCEF) IVPB 2 g/50 mL premix 2 g 100 mL/hr      Day of Hospitalization: 4  Consults: Treatment Team:  Johnny Bridge, MD  Assessment/Plan:   Principal Problem:   Closed supracondylar fracture of right femur, periprosthetic Active Problems:   Multiple sclerosis   HTN (hypertension)   DM (diabetes mellitus)   Paraplegia   Femoral fracture   Right femur fracture, periprosthetic POD #4 fromIM retrograde femoral nailing of right supracondylar periprosthetic femur fracture, s/p fall at home during transferring from wheel chair to commode.  -pain controlled, d/c all opiates and only kept tylenol for now because of increased somnolence -NWB RLE per ortho -PT/OT recommended SNF   HTN On amlodipine 5mg , atenolol 50, valsartan 320 home regimen -restart amlodipine today   MS Followed by [facility] neuro. is on prednisone + tecifedera  -baclofen for spasm  DM II  HA1C 7.6 on 07/21/12. Takes glimepiride 2 mg BID, lantus 10 units qhs and metformin 1000mg  BID at home -lantus 5 units qhs + SSI   F/E/N Fluids- None  Electrolytes- Replete as needed  Nutrition- Carb modified    VTE PPx  lovenox, SCDs  Disposition Disposition is deferred, awaiting SNF placement, likely tomorrow or Tuesday. Stable for discharge from medicine standpoint.     LOS: 4  days   Jones Bales, MD PGY-2, Internal Medicine Teaching Service 06/05/2014, 8:02 AM

## 2014-06-05 NOTE — Progress Notes (Addendum)
Subjective: 3 Days Post-Op Procedure(s) (LRB): INTRAMEDULLARY (IM) RETROGRADE FEMORAL NAILING (Right) Patient reports pain as mild.  Has noticed improvement from yesterday was up in chair.  Baseline requires wheelchair has motorized here with her, was doing mainly bed to chair transfers with minimal standing.  Objective: Vital signs in last 24 hours: Temp:  [97.8 F (36.6 C)-98.6 F (37 C)] 98.6 F (37 C) (12/06 0615) Pulse Rate:  [92-99] 92 (12/06 0615) Resp:  [16-18] 18 (12/06 0615) BP: (132-150)/(77-86) 149/77 mmHg (12/06 0615) SpO2:  [94 %-100 %] 100 % (12/06 0615)  Intake/Output from previous day:   Intake/Output this shift: Total I/O In: 240 [P.O.:240] Out: -    Recent Labs  06/03/14 0530 06/04/14 0648 06/05/14 0459  HGB 10.1* 9.9* 9.6*    Recent Labs  06/04/14 0648 06/05/14 0459  WBC 11.0* 9.2  RBC 3.46* 3.39*  HCT 30.9* 30.0*  PLT 220 249    Recent Labs  06/03/14 0530 06/04/14 0648  NA 135* 141  K 4.2 3.9  CL 99 104  CO2 23 23  BUN 15 9  CREATININE 0.82 0.51  GLUCOSE 193* 108*  CALCIUM 8.7 9.3   No results for input(s): LABPT, INR in the last 72 hours.  Sensation intact distally Dorsiflexion/Plantar flexion intact Incision: dressing C/D/I Compartment soft  DF/PF intact but diminished/weak likely baseline with MS  Assessment/Plan: 3 Days Post-Op Procedure(s) (LRB): INTRAMEDULLARY (IM) RETROGRADE FEMORAL NAILING (Right)  Principal Problem:  Closed supracondylar fracture of right femur, periprosthetic Active Problems:  Multiple sclerosis  HTN (hypertension)  DM (diabetes mellitus)  Paraplegia  Femoral fracture   Advance diet Up with therapy Continue plan per medicine, ok to d/c from ortho standpoint NWB right lower ext Dry dressing PRN  Trevor Duty 06/05/2014, 11:03 AM

## 2014-06-06 LAB — GLUCOSE, CAPILLARY
GLUCOSE-CAPILLARY: 74 mg/dL (ref 70–99)
GLUCOSE-CAPILLARY: 85 mg/dL (ref 70–99)
Glucose-Capillary: 118 mg/dL — ABNORMAL HIGH (ref 70–99)
Glucose-Capillary: 123 mg/dL — ABNORMAL HIGH (ref 70–99)
Glucose-Capillary: 189 mg/dL — ABNORMAL HIGH (ref 70–99)

## 2014-06-06 MED ORDER — INSULIN GLARGINE 100 UNIT/ML ~~LOC~~ SOLN: 10.0000 [IU] | Freq: Every day | SUBCUTANEOUS | Status: DC

## 2014-06-06 MED ORDER — POLYETHYLENE GLYCOL 3350 17 G PO PACK: 17.0000 g | PACK | Freq: Every day | ORAL | Status: DC | PRN

## 2014-06-06 MED ORDER — ENOXAPARIN SODIUM 40 MG/0.4ML ~~LOC~~ SOLN: 40.0000 mg | SUBCUTANEOUS | Status: DC

## 2014-06-06 MED ORDER — INSULIN GLARGINE 100 UNIT/ML ~~LOC~~ SOLN: 5.0000 [IU] | Freq: Every day | SUBCUTANEOUS | Status: DC

## 2014-06-06 NOTE — Clinical Social Work Placement (Signed)
Clinical Social Work Department CLINICAL SOCIAL WORK PLACEMENT NOTE 06/06/2014  Patient:  Laura Roberts, Laura Roberts  Account Number:  [id number] [place] date:  06/01/2014  Clinical Social Worker:  Delrae Sawyers  Date/time:  06/06/2014 12:28 PM  Clinical Social Work is seeking post-discharge placement for this patient at the following level of care:   [place]   (*CSW will update this form in Epic as items are completed)   06/03/2014  Patient/family provided with [place] Department of Clinical Social Work's list of facilities offering this level of care within the geographic area requested by the patient (or if unable, by the patient's family).  06/03/2014  Patient/family informed of their freedom to choose among providers that offer the needed level of care, that participate in Medicare, Medicaid or managed care program needed by the patient, have an available bed and are willing to accept the patient.  06/03/2014  Patient/family informed of [facility]' ownership interest in [facility], as well as of the fact that they are under no obligation to receive care at this facility.  PASARR submitted to EDS on  PASARR number received on   FL2 transmitted to all facilities in geographic area requested by pt/family on  06/03/2014 FL2 transmitted to all facilities within larger geographic area on   Patient informed that his/her managed care company has contracts with or will negotiate with  certain facilities, including the following:     Patient/family informed of bed offers received:  06/03/2014 Patient chooses bed at [place] Physician recommends and patient chooses bed at    Patient to be transferred to [place] on  06/06/2014 Patient to be transferred to facility by [facility] Patient and family notified of transfer on 06/06/2014 Name of family member notified:  Pt updated at bedside.  The following physician request  were entered in Epic:   Additional Comments: PASARR previously existing.  Lubertha Sayres, [place] ([phone]) Licensed Clinical Social Worker Orthopedics [phone]) and Surgical [phone])

## 2014-06-06 NOTE — Progress Notes (Signed)
Report was called to [facility].

## 2014-06-06 NOTE — Progress Notes (Addendum)
Subjective: Doing well today. Pain is very well controlled on just tylenol. Is sitting in chair today, was lifted with hoyer lift to the chair. Non weight bearing per ortho. Denies any sob, chest pain, n/v, headache, or any other complaints.  Objective: Vital signs in last 24 hours: Filed Vitals:   06/06/14 0355 06/06/14 0510 06/06/14 0800 06/06/14 1200  BP:  161/79    Pulse:  87    Temp:  98.3 F (36.8 C)    TempSrc:  Oral    Resp: 18 18 18 18   Height:      Weight:      SpO2: 98% 100%     Weight change:   Intake/Output Summary (Last 24 hours) at 06/06/14 1344 Last data filed at 06/06/14 0800  Gross per 24 hour  Intake    480 ml  Output      0 ml  Net    480 ml   Vitals reviewed. General: resting in chair, NAD HEENT: PERRL, EOMI, no scleral icterus Cardiac: RRR, no rubs, murmurs or gallops Pulm: clear to auscultation bilaterally, no wheezes, rales, or rhonchi Abd: soft, nontender, nondistended, BS present Ext: right leg has TTP throughout, is wearing knee immobilizer. Left leg normal. No pain. Neuro: somnolent but oriented X3, cranial nerves II-XII grossly intact, strength and sensation to light touch equal in bilateral upper and lower left ext. Right lower ext limited rom because of the knee immobilizer.   Lab Results: Basic Metabolic Panel:  Recent Labs Lab 06/03/14 0530 06/04/14 0648  NA 135* 141  K 4.2 3.9  CL 99 104  CO2 23 23  GLUCOSE 193* 108*  BUN 15 9  CREATININE 0.82 0.51  CALCIUM 8.7 9.3   Liver Function Tests:  Recent Labs Lab 06/01/14 1653  AST 18  ALT 15  ALKPHOS 62  BILITOT 1.2  PROT 6.8  ALBUMIN 3.6   No results for input(s): LIPASE, AMYLASE in the last 168 hours. No results for input(s): AMMONIA in the last 168 hours. CBC:  Recent Labs Lab 06/01/14 1653  06/04/14 0648 06/05/14 0459  WBC 13.5*  < > 11.0* 9.2  NEUTROABS 11.8*  --   --   --   HGB 11.9*  < > 9.9* 9.6*  HCT 36.7  < > 30.9* 30.0*  MCV 89.5  < > 89.3 88.5  PLT  237  < > 220 249  < > = values in this interval not displayed. Cardiac Enzymes: No results for input(s): CKTOTAL, CKMB, CKMBINDEX, TROPONINI in the last 168 hours. BNP: No results for input(s): PROBNP in the last 168 hours. D-Dimer: No results for input(s): DDIMER in the last 168 hours. CBG:  Recent Labs Lab 06/05/14 1649 06/05/14 2054 06/06/14 0004 06/06/14 0410 06/06/14 0737 06/06/14 1138  GLUCAP 176* 222* 118* 74 85 123*   Hemoglobin A1C: No results for input(s): HGBA1C in the last 168 hours. Fasting Lipid Panel: No results for input(s): CHOL, HDL, LDLCALC, TRIG, CHOLHDL, LDLDIRECT in the last 168 hours. Thyroid Function Tests: No results for input(s): TSH, T4TOTAL, FREET4, T3FREE, THYROIDAB in the last 168 hours. Coagulation: No results for input(s): LABPROT, INR in the last 168 hours. Anemia Panel: No results for input(s): VITAMINB12, FOLATE, FERRITIN, TIBC, IRON, RETICCTPCT in the last 168 hours. Urine Drug Screen: Drugs of Abuse  No results found for: LABOPIA, COCAINSCRNUR, LABBENZ, AMPHETMU, THCU, LABBARB  Alcohol Level: No results for input(s): ETH in the last 168 hours. Urinalysis: No results for input(s): COLORURINE, LABSPEC, [place],  GLUCOSEU, HGBUR, BILIRUBINUR, KETONESUR, PROTEINUR, UROBILINOGEN, NITRITE, LEUKOCYTESUR in the last 168 hours.  Invalid input(s): APPERANCEUR Misc. Labs:  Micro Results: Recent Results (from the past 240 hour(s))  Surgical PCR screen     Status: None   Collection Time: 06/01/14 10:22 PM  Result Value Ref Range Status   MRSA, PCR NEGATIVE NEGATIVE Final   Staphylococcus aureus NEGATIVE NEGATIVE Final    Comment:        The Xpert SA Assay (FDA approved for NASAL specimens in patients over 75 years of age), is one component of a comprehensive surveillance program.  Test performance has been validated by [employer] for patients greater than or equal to 72 year old. It is not intended to diagnose infection nor to guide  or monitor treatment.    Studies/Results: No results found. Medications: I have reviewed the patient's current medications. Scheduled Meds: . baclofen  10 mg Oral Q24H  . baclofen  10 mg Oral Q24H  . baclofen  20 mg Oral QHS  . cholecalciferol  1,000 Units Oral Daily  . Dimethyl Fumarate  240 mg Oral BID  . docusate sodium  100 mg Oral BID  . DULoxetine  90 mg Oral Daily  . enoxaparin (LOVENOX) injection  40 mg Subcutaneous Q24H  . insulin aspart  0-9 Units Subcutaneous 6 times per day  . insulin glargine  5 Units Subcutaneous QHS  . oxybutynin  5 mg Oral BID  . pantoprazole  40 mg Oral Daily  . predniSONE  7.5 mg Oral Q48H  . senna  1 tablet Oral BID  . sodium chloride  500 mL Intravenous Once   Continuous Infusions:   PRN Meds:.acetaminophen **OR** acetaminophen, alum & mag hydroxide-simeth, bisacodyl, menthol-cetylpyridinium **OR** phenol, naLOXone (NARCAN)  injection, ondansetron **OR** ondansetron (ZOFRAN) IV, polyethylene glycol Assessment/Plan: Principal Problem:   Closed supracondylar fracture of right femur, periprosthetic Active Problems:   Multiple sclerosis   HTN (hypertension)   DM (diabetes mellitus)   Paraplegia   Femoral fracture 73 yo female with MS, HTN, DM II, b/l knee replacement here with right periprosthetic femur fracture.  Right femur fracture, periprosthetic -POD #4 from  IM retrograde femoral nailing of right supracondylar periprosthetic femur fracture. s/p fall at home during transferring from wheel chair to commode.  - pain well controlled on tylenol. D/ced all opiates and only kept tylenol her somnolence from opiates few days ago. - PT/ot working with her and recommended SNF.  [facility] Cater to SNF today. - Return to ortho clinic in 2 weeks. - NWB RLE. - Lovenox for 3 weeks for DVT ppx s/p surgery.  AMS - was very somnolent and BP was low few nights ago. likely from opiates. Improved. Avoid opiate.  HTN - takes amlodipine 5mg , atenolol 50, valsartan  320 home regimen - BP was low so held all antihypertensives. -restarting amlodipine 5mg  and valsartan 320mg  today as BP is high in 160's today. Can restart atenolol 50 a the SNF if BP remains high.   MS - followed by [facility] neuro. is on prednisone + tecifedera.  - Gave some increased prednisone as stress dose here 7.5mg  for 3 days. Will put her back to her home regimen of 5 mg and 2.5mg  alterating dose on discharge - baclofen for spasm continued  DM II - hgba1c 7.6 on 07/21/12. Takes glimepiride 2 mg BID, lantus 10 units qhs and metformin 1000mg  BID at home - lantus 5 units qhs + SSI here. CBG's well controlled. Back to home regimen on discharge.  Diet: advanced as tolerated. Now on carb mod. DVT: lovenox. Needs for 3 weeks per ortho, s/p surgery.  Dispo: Disposition is deferred at this time, awaiting improvement of current medical problems.  Anticipated discharge in approximately 1-2 day(s).   The patient does have a current PCP [name], MD) and does need an [facility] hospital follow-up appointment after discharge.  The patient does have transportation limitations that hinder transportation to clinic appointments.  .Services Needed at time of discharge: Y = Yes, Blank = No PT:   OT:   RN:   Equipment:   Other:     LOS: 5 days   Dellia Nims, MD 06/06/2014, 1:44 PM

## 2014-06-06 NOTE — Progress Notes (Signed)
Physical Therapy Treatment Patient Details Name: Laura Roberts MRN: [id number] DOB: 10/16/1940 Today's Date: 06/06/2014    History of Present Illness Laura Roberts is a 73 y.o. Female s/p Right IM nail for femur fx resulting from a fall at home during w/c transfer. PMH: HTN, MS, DM, Bil CTS, gait disturbance, obeity, spinal stenosis (cervical, lumbosacral, and thoracic), athritis. Surgical hx of Bil TKA (2004), back surgery, Left CTR, cataract extraction.    PT Comments    Pt is progressing well with her mobility and is much more alert and participative today compared to evaluation.  We did not believe we could safely maintain NWB on her right leg and have her preform her normal scoot transfer to the chair or the electric chair, so maxi move total lift was used.  Pt happy to be up OOB.  PT will continue to follow acutely.   Follow Up Recommendations  SNF     Equipment Recommendations    NA   Recommendations for Other Services   NA     Precautions / Restrictions Precautions Precautions: Fall Precaution Comments: h/o falls Required Braces or Orthoses: Knee Immobilizer - Right Restrictions Weight Bearing Restrictions: Yes RLE Weight Bearing: Non weight bearing    Mobility  Bed Mobility Overal bed mobility: Needs Assistance Bed Mobility: Supine to Sit     Supine to sit: Min assist     General bed mobility comments: Min assist to help progress her right leg out of bed.  Pt using leg loop for moving left leg and bed rail for assisting with transition of trunk.  Pt reports she has a hospital bed with rails at home.   Transfers Overall transfer level: Needs assistance Equipment used:  (maxi move total lift)             General transfer comment: Pt has been essentially parapelegic and transfers by using her arms to boost herself into the recliner chair. She, however, is unsafe to do this with NWB status of her right leg (per pt her right leg is the one she puts the  most weight through as she normally transfers to her right during her transfers at home.  So, in order to abide by her R NWB, we used the maxi move total lift to lift her OOB to the chair to get the benefits of being upright.              Balance Overall balance assessment: Needs assistance Sitting-balance support: Feet supported;Bilateral upper extremity supported Sitting balance-Leahy Scale: Fair                              Cognition Arousal/Alertness: Awake/alert Behavior During Therapy: WFL for tasks assessed/performed Overall Cognitive Status: Within Functional Limits for tasks assessed                             Pertinent Vitals/Pain Pain Assessment: No/denies pain     PT Goals (current goals can now be found in the care plan section) Acute Rehab PT Goals Patient Stated Goal: to get better, go to rehab and get back home.  Progress towards PT goals: Progressing toward goals    Frequency  Min 3X/week    PT Plan Current plan remains appropriate       End of Session Equipment Utilized During Treatment: Other (comment);Right knee immobilizer (maxi move lift machine) Activity Tolerance: Patient tolerated  treatment well Patient left: in chair;with call bell/phone within reach     Time: 0940-0953 PT Time Calculation (min) (ACUTE ONLY): 13 min  Charges:  $Therapeutic Activity: 8-22 mins                      Aubre Quincy B. Chanel Mcadams, PT, DPT [phone]   06/06/2014, 10:28 AM

## 2014-06-06 NOTE — Discharge Summary (Signed)
Name: Laura Roberts MRN: [id number] DOB: Jul 27, 1940 73 y.o. PCP: [name], MD  Date of Admission: 06/01/2014  4:10 PM Date of Discharge: 06/06/2014 Attending Physician: Bartholomew Crews, MD  Discharge Diagnosis: Principal Problem:   Closed supracondylar fracture of right femur, periprosthetic Active Problems:   Multiple sclerosis   HTN (hypertension)   DM (diabetes mellitus)   Paraplegia   Femoral fracture  Discharge Medications:   Medication List    STOP taking these medications        atenolol 50 MG tablet  Commonly known as:  TENORMIN      TAKE these medications        amLODipine 5 MG tablet  Commonly known as:  NORVASC  Take 5 mg by mouth at bedtime.     baclofen 10 MG tablet  Commonly known as:  LIORESAL  One tablet in the morning and midday, two tablets at night     cholecalciferol 1000 UNITS tablet  Commonly known as:  VITAMIN D  Take 1,000 Units by mouth daily.     DULoxetine 30 MG capsule  Commonly known as:  CYMBALTA  Take 30 mg by mouth daily. Take with 60mg  for a total of 90mg      enoxaparin 40 MG/0.4ML injection  Commonly known as:  LOVENOX  Inject 0.4 mLs (40 mg total) into the skin daily.     gabapentin 300 MG capsule  Commonly known as:  NEURONTIN  Take 300 mg by mouth 3 (three) times daily.     glimepiride 2 MG tablet  Commonly known as:  AMARYL  Take 2 mg by mouth 2 (two) times daily.     insulin glargine 100 UNIT/ML injection  Commonly known as:  LANTUS  Inject 0.05 mLs (5 Units total) into the skin at bedtime.     metFORMIN 1000 MG tablet  Commonly known as:  GLUCOPHAGE  Take 1,000 mg by mouth 2 (two) times daily with a meal.     omeprazole 20 MG capsule  Commonly known as:  PRILOSEC  Take 20 mg by mouth daily.     oxybutynin 5 MG tablet  Commonly known as:  DITROPAN  Take 5 mg by mouth 2 (two) times daily.     polyethylene glycol packet  Commonly known as:  MIRALAX / GLYCOLAX  Take 17 g by mouth daily as  needed for mild constipation.     predniSONE 5 MG tablet  Commonly known as:  DELTASONE  1 tablet on odd days, one half tablet on even days     TECFIDERA 240 MG Cpdr  Generic drug:  Dimethyl Fumarate  Take 240 mg by mouth 2 (two) times daily.     valsartan 320 MG tablet  Commonly known as:  DIOVAN  Take 320 mg by mouth at bedtime.     vitamin B-12 100 MCG tablet  Commonly known as:  CYANOCOBALAMIN  Take 100 mcg by mouth daily.        Disposition and follow-up:   Ms.Laura Roberts was discharged from [facility] in Stable condition.  At the hospital follow up visit please address:  1.   Follow up with Dr. PCP in 1-2 weeks.  She should follow up with ortho in 2 weeks.   She needs to be on lovenox for 3 weeks for DVT px s/p ortho surgery.  Please avoid opiates if she doesn't have severe pain as she was very somnolent and hypotensive from opiate in the hospital.  She was somnolent and BP was low during this admission on POD#1 likely from opiates as she is opiate naive. Held BP meds. Now BP in 160's so restarted amlodipine and valsartan today. Didn't restart atenolol because I want to be cautious that she does not drop too much. Please feel free to restart her atenolol if BP remains elevated.  2.  Labs / imaging needed at time of follow-up:   3.  Pending labs/ test needing follow-up:   Follow-up Appointments:     Follow-up Information    Follow up with Johnny Bridge, MD. Schedule an appointment as soon as possible for a visit in 2 weeks.   Specialty:  Orthopedic Surgery   Why:  make appt to see Dr. Mardelle Matte in 2 weeks.   Contact information:   [address] [place] [address] [phone]       Follow up with Sherian Maroon, MD.   Specialty:  Family Medicine   Contact information:   [address] [address] [phone]       Discharge Instructions: Discharge Instructions    Non weight bearing     Complete by:  As directed   Laterality:  right  Extremity:  Lower           Consultations: Treatment Team:  Johnny Bridge, MD  Procedures Performed:  Dg Femur Right  06/02/2014   CLINICAL DATA:  Distal femur fracture, operative fixation  EXAM: RIGHT FEMUR - 2 VIEW; DG C-ARM 61-120 MIN  COMPARISON:  06/01/2014  FINDINGS: Spot fluoroscopic intraoperative views demonstrating right femur intra medullary rod insertion reducing the right distal femur metaphysis fracture above the knee replacement hardware. Anatomic alignment. Fracture lines remain visible. Bones are osteopenic.  IMPRESSION: Status post ORIF of the right distal femur fracture at the knee. No complicating feature.   Electronically Signed   By: Daryll Brod M.D.   On: 06/02/2014 15:51   Dg Knee 1-2 Views Right  06/01/2014   CLINICAL DATA:  Right knee pain in its entirety. Pt. Fell on right knee in bathroom at sons home on tile floor 3 days ago. Hx of right knee replacement in 2003.  EXAM: RIGHT KNEE - COMPLETE 4+ VIEW  COMPARISON:  None.  FINDINGS: Status post total knee arthroplasty. An oblique fracture of the distal femoral meta diaphysis with approximately 1/3 shaft with displacement of distal fracture fragment medially. Posttraumatic. Tibial component appears normal. Probable small suprapatellar joint effusion.  IMPRESSION: Oblique fracture of the distal femur, just cephalad to the femoral component of the total knee arthroplasty.  These results will be called to the ordering clinician or representative by the Radiologist Assistant, and communication documented in the PACS or zVision Dashboard.   Electronically Signed   By: Abigail Miyamoto M.D.   On: 06/01/2014 15:24   Dg Chest Portable 1 View  06/01/2014   CLINICAL DATA:  Preop evaluation.  EXAM: PORTABLE CHEST - 1 VIEW  COMPARISON:  July 24, 2012.  FINDINGS: Stable cardiomediastinal silhouette. No pneumothorax or pleural effusion is noted. Both lungs are clear. The visualized  skeletal structures are unremarkable.  IMPRESSION: No acute cardiopulmonary abnormality seen.   Electronically Signed   By: Sabino Dick M.D.   On: 06/01/2014 16:55   Dg Femur Right Port  06/02/2014   CLINICAL DATA:  Post RIGHT femoral nailing  EXAM: PORTABLE RIGHT FEMUR - 2 VIEW  COMPARISON:  Portable exam 1702 hr compared intraoperative images of 06/02/2014 as well as preoperative  images of 06/01/2014  FINDINGS: Long IM nail RIGHT femur with multiple distal locking screws.  RIGHT knee prosthesis again seen.  Distal femoral metaphyseal fracture identified on preoperative images appears significantly improved in alignment.  No new fracture, dislocation or bone destruction.  Artifacts from splint and a bag of ice are noted.  IMPRESSION: Post nailing of distal RIGHT femoral metaphyseal fracture.   Electronically Signed   By: Lavonia Dana M.D.   On: 06/02/2014 17:20   Dg C-arm 1-60 Min  06/02/2014   CLINICAL DATA:  Distal femur fracture, operative fixation  EXAM: RIGHT FEMUR - 2 VIEW; DG C-ARM 61-120 MIN  COMPARISON:  06/01/2014  FINDINGS: Spot fluoroscopic intraoperative views demonstrating right femur intra medullary rod insertion reducing the right distal femur metaphysis fracture above the knee replacement hardware. Anatomic alignment. Fracture lines remain visible. Bones are osteopenic.  IMPRESSION: Status post ORIF of the right distal femur fracture at the knee. No complicating feature.   Electronically Signed   By: Daryll Brod M.D.   On: 06/02/2014 15:51    Admission HPI:   Ms. Depaolis is a 73yo woman w/ PMHx of multiple sclerosis, HTN, Type 2 DM, and hx of bilateral total knee replacements who presented to the ED with right leg pain. Patient reports she had a fall on Monday (05/30/14) which resulted in severe right leg pain. She states she was using the comode at her son's house and was transferring back to her wheelchair when she slipped and fell onto her right leg. She reports she is unable to bear  any weight on her leg. She is normally wheelchair-bound but uses her right leg to stand and pivot. She denies any pain currently. She states she only has pain when trying to mobilize. She normally takes Gabapentin and Cymbalta for pain.   Hospital Course by problem list: Principal Problem:   Closed supracondylar fracture of right femur, periprosthetic Active Problems:   Multiple sclerosis   HTN (hypertension)   DM (diabetes mellitus)   Paraplegia   Femoral fracture   73 yo female with MS, HTN, DM II, b/l knee replacement here with right periprosthetic femur fracture.  Right femur fracture, periprosthetic -POD #4 from IM retrograde femoral nailing of right supracondylar periprosthetic femur fracture. s/p fall at home during transferring from wheel chair to commode.  - pain well controlled on tylenol. D/ced all opiates and only kept tylenol her somnolence from opiates few days ago. - PT/ot working with her and recommended SNF. [facility] Cater to SNF today. - Return to ortho clinic in 2 weeks. - NWB RLE. - Lovenox for 3 weeks for DVT ppx s/p surgery.  AMS - was very somnolent and BP was low few nights ago. likely from opiates. Improved. Avoid opiate.  HTN - takes amlodipine 5mg , atenolol 50, valsartan 320 home regimen - BP was low so held all antihypertensives. -restarting amlodipine 5mg  and valsartan 320mg  today as BP is high in 160's today. Can restart atenolol 50 a the SNF if BP remains high.   MS - followed by [facility] neuro. is on prednisone + tecifedera.  - Gave some increased prednisone as stress dose here 7.5mg  for 3 days. Will put her back to her home regimen of 5 mg and 2.5mg  alterating dose on discharge - baclofen for spasm continued  DM II - hgba1c 7.6 on 07/21/12. Takes glimepiride 2 mg BID, lantus 10 units qhs and metformin 1000mg  BID at home - lantus 5 units qhs + SSI here. Home regimen on discharge  Diet: advanced as tolerated. Now on carb mod. DVT: lovenox. Needs for 3  weeks per ortho, s/p surgery.  Discharge Vitals:   BP 161/79 mmHg  Pulse 87  Temp(Src) 98.3 F (36.8 C) (Oral)  Resp 18  Ht 5' 1.81" (1.57 m)  Wt 169 lb (76.658 kg)  BMI 31.10 kg/m2  SpO2 100%  Discharge Labs:  Results for orders placed or performed during the hospital encounter of 06/01/14 (from the past 24 hour(s))  Glucose, capillary     Status: Abnormal   Collection Time: 06/05/14  4:49 PM  Result Value Ref Range   Glucose-Capillary 176 (H) 70 - 99 mg/dL  Glucose, capillary     Status: Abnormal   Collection Time: 06/05/14  8:54 PM  Result Value Ref Range   Glucose-Capillary 222 (H) 70 - 99 mg/dL  Glucose, capillary     Status: Abnormal   Collection Time: 06/06/14 12:04 AM  Result Value Ref Range   Glucose-Capillary 118 (H) 70 - 99 mg/dL  Glucose, capillary     Status: None   Collection Time: 06/06/14  4:10 AM  Result Value Ref Range   Glucose-Capillary 74 70 - 99 mg/dL  Glucose, capillary     Status: None   Collection Time: 06/06/14  7:37 AM  Result Value Ref Range   Glucose-Capillary 85 70 - 99 mg/dL   Comment 1 Notify RN    Comment 2 Documented in Chart   Glucose, capillary     Status: Abnormal   Collection Time: 06/06/14 11:38 AM  Result Value Ref Range   Glucose-Capillary 123 (H) 70 - 99 mg/dL   Comment 1 Notify RN    Comment 2 Documented in Chart     Signed: Dellia Nims, MD 06/06/2014, 1:56 PM    Services Ordered on Discharge:  Equipment Ordered on Discharge:

## 2014-06-06 NOTE — Discharge Instructions (Signed)
Diet: As you were doing prior to hospitalization   Shower:  May shower but keep the wounds dry, use an occlusive plastic wrap, NO SOAKING IN TUB.  If the bandage gets wet, change with a clean dry gauze.  Dressing:  You may change your dressing 3-5 days after surgery.  Then change the dressing daily with sterile gauze dressing.    There are sticky tapes (steri-strips) on your wounds and all the stitches are absorbable.  Leave the steri-strips in place when changing your dressings, they will peel off with time, usually 2-3 weeks.  Activity:  Increase activity slowly as tolerated, but follow the weight bearing instructions below.  No lifting or driving for 6 weeks.  Weight Bearing:   Non-Weight bearing on right leg.    To prevent constipation: you may use a stool softener such as -  Colace (over the counter) 100 mg by mouth twice a day  Drink plenty of fluids (prune juice may be helpful) and high fiber foods Miralax (over the counter) for constipation as needed.    Itching:  If you experience itching with your medications, try taking only a single pain pill, or even half a pain pill at a time.  You may take up to 10 pain pills per day, and you can also use benadryl over the counter for itching or also to help with sleep.   Precautions:  If you experience chest pain or shortness of breath - call 911 immediately for transfer to the hospital emergency department!!  If you develop a fever greater that 101 F, purulent drainage from wound, increased redness or drainage from wound, or calf pain -- Call the office at [phone]                                                Follow- Up Appointment:  Please call for an appointment to be seen in 2 weeks [facility] - [phone]  ------------  Please use lovenox for 3 weeks for preventing blood clots post surgery.

## 2014-06-06 NOTE — Clinical Social Work Note (Signed)
Pt to be discharged to [facility]. Pt updated at bedside regarding discharge.  Pt's [facility] CSW Raquel Sarna, [phone]) updated regarding discharge.  Facility: [facility] Report number: [phone] Transportation: EMS ([facility], [phone]) - [facility] to pick-up pt's motorized wheelchair as will not fit in EMS. RN aware.  Lubertha Sayres, [place] [phone]) Licensed Clinical Social Worker Orthopedics [phone]) and Surgical [phone])

## 2014-06-06 NOTE — Progress Notes (Signed)
     Subjective:  Patient reports pain as mild.  No complaints.  Objective:   VITALS:   Filed Vitals:   06/05/14 2052 06/06/14 0000 06/06/14 0355 06/06/14 0510  BP: 140/70   161/79  Pulse: 114   87  Temp: 98 F (36.7 C)   98.3 F (36.8 C)  TempSrc: Oral   Oral  Resp: 18 18 18 18   Height:      Weight:      SpO2: 98% 98% 98% 100%    No change in neuro exam.  Weak to df/pf.  Sensation intact.  Lab Results  Component Value Date   WBC 9.2 06/05/2014   HGB 9.6* 06/05/2014   HCT 30.0* 06/05/2014   MCV 88.5 06/05/2014   PLT 249 06/05/2014   BMET    Component Value Date/Time   NA 141 06/04/2014 0648   NA 139 01/05/2014 1406   K 3.9 06/04/2014 0648   CL 104 06/04/2014 0648   CO2 23 06/04/2014 0648   GLUCOSE 108* 06/04/2014 0648   GLUCOSE 170* 01/05/2014 1406   BUN 9 06/04/2014 0648   BUN 19 01/05/2014 1406   CREATININE 0.51 06/04/2014 0648   CALCIUM 9.3 06/04/2014 0648   GFRNONAA >90 06/04/2014 0648   GFRAA >90 06/04/2014 8546     Assessment/Plan: 4 Days Post-Op   Principal Problem:   Closed supracondylar fracture of right femur, periprosthetic Active Problems:   Multiple sclerosis   Paraplegia   HTN (hypertension)   DM (diabetes mellitus)   Femoral fracture  NWB RLE, ok for dc to snf.  lovenox for 3 weeks.  RTC 2 weeks.  Pepe Mineau P 06/06/2014, 7:37 AM   Marchia Bond, MD Cell [phone]

## 2014-07-08 ENCOUNTER — Ambulatory Visit: Payer: Medicare Other | Admitting: Adult Health

## 2014-07-18 ENCOUNTER — Ambulatory Visit (INDEPENDENT_AMBULATORY_CARE_PROVIDER_SITE_OTHER): Payer: Medicare (Managed Care) | Admitting: Adult Health

## 2014-07-18 ENCOUNTER — Encounter: Payer: Self-pay | Admitting: Adult Health

## 2014-07-18 VITALS — BP 131/86 | HR 122 | Ht 62.0 in | Wt 157.0 lb

## 2014-07-18 DIAGNOSIS — G8220 Paraplegia, unspecified: Secondary | ICD-10-CM

## 2014-07-18 DIAGNOSIS — G35 Multiple sclerosis: Secondary | ICD-10-CM

## 2014-07-18 DIAGNOSIS — R269 Unspecified abnormalities of gait and mobility: Secondary | ICD-10-CM

## 2014-07-18 NOTE — Progress Notes (Signed)
I have read the note, and I agree with the clinical assessment and plan.  Shaindy Reader KEITH   

## 2014-07-18 NOTE — Patient Instructions (Signed)
Continue Tecfidera and baclofen.  Continue with rehab. If your symptoms worsen or you develop new symptoms let us know.

## 2014-07-18 NOTE — Progress Notes (Signed)
PATIENT: Laura Roberts DOB: 1940/11/27  REASON FOR VISIT: follow up- multiple sclerosis, abnormality of gait, spastic paraparesis HISTORY FROM: patient  HISTORY OF PRESENT ILLNESS: Laura Roberts is a 74 year old female with a history of multiple sclerosis with spastic paraparesis. She returns today for follow-up. She is currently taking Tecfidera and baclofen. In July the patient had an exacerbation was given a steroid Dosepak. A repeat MRI was completed in August that showed interval progression of cerebral White matter disease, no enhancing lesions. Since then the patient reports that she broke her right leg over thanksgiving. She has had surgery and is now in [facility] for rehab. she continues to use a wheelchair. Denies any new numbness or weakness. Denies any troubles with swallowing. No changes with bowels or bladder. No changes with her vision- she states that she had cataracts removed from both eyes. The patient reports fatigue but nothing new. She states that has been ongoing.    HISTORY 01/05/14: Laura Roberts is a 74 year old female with a history of multiple sclerosis with spastic paraparesis. She returns today for follow-up. The patient currently takes Tecfidera and baclofen and is tolerating them well. Baclofen dose was increased at the last visit and patient states that it is working better now. She is wheelchair bound. She has been completing physical therapy and occupational therapy. Patient states that she does feel tired but therapy is going well. Patient is able to do transfers by sliding herself from the wheelchair and using upper body strength. No issues with swallowing. Denies any new numbness or weakness. She does have carpal tunnel syndrome in both hands and has some weakness associated with that. Overall she feels that she is doing better this visit.   REVIEW OF SYSTEMS: Out of a complete 14 system review of symptoms, the patient complains only of the following symptoms, and  all other reviewed systems are negative.  Fatigue, restless legs, insomnia, joint swelling, back pain, walking difficulty, incontinence of bladder, numbness, weakness, depression  ALLERGIES: Allergies  Allergen Reactions  . Codeine Other (See Comments)    Difficult breathing and skin problem  . Ultram [Tramadol] Other (See Comments)    Difficult breathing and skin peeling    HOME MEDICATIONS: Outpatient Prescriptions Prior to Visit  Medication Sig Dispense Refill  . amLODipine (NORVASC) 5 MG tablet Take 5 mg by mouth at bedtime.     . baclofen (LIORESAL) 10 MG tablet One tablet in the morning and midday, two tablets at night 120 tablet 5  . cholecalciferol (VITAMIN D) 1000 UNITS tablet Take 1,000 Units by mouth daily.    . Dimethyl Fumarate (TECFIDERA) 240 MG CPDR Take 240 mg by mouth 2 (two) times daily.    . DULoxetine (CYMBALTA) 30 MG capsule Take 30 mg by mouth daily. Take with 60mg  for a total of 90mg     . enoxaparin (LOVENOX) 40 MG/0.4ML injection Inject 0.4 mLs (40 mg total) into the skin daily. 21 Syringe 0  . gabapentin (NEURONTIN) 300 MG capsule Take 300 mg by mouth 3 (three) times daily.    Marland Kitchen glimepiride (AMARYL) 2 MG tablet Take 2 mg by mouth 2 (two) times daily.    . insulin glargine (LANTUS) 100 UNIT/ML injection Inject 0.1 mLs (10 Units total) into the skin at bedtime. 10 mL 11  . metFORMIN (GLUCOPHAGE) 1000 MG tablet Take 1,000 mg by mouth 2 (two) times daily with a meal.    . omeprazole (PRILOSEC) 20 MG capsule Take 20 mg by  mouth daily.    Marland Kitchen oxybutynin (DITROPAN) 5 MG tablet Take 5 mg by mouth 2 (two) times daily.    . polyethylene glycol (MIRALAX / GLYCOLAX) packet Take 17 g by mouth daily as needed for mild constipation. 14 each 0  . predniSONE (DELTASONE) 5 MG tablet 1 tablet on odd days, one half tablet on even days    . valsartan (DIOVAN) 320 MG tablet Take 320 mg by mouth at bedtime.     . vitamin B-12 (CYANOCOBALAMIN) 100 MCG tablet Take 100 mcg by mouth daily.      No facility-administered medications prior to visit.    PAST MEDICAL HISTORY: Past Medical History  Diagnosis Date  . Hypertension   . Multiple sclerosis   . Diabetes mellitus without complication   . Neuromuscular disorder     Bilateral hand carpal tunnel syndrome  . Gait disturbance   . Obesity   . Spinal stenosis in cervical region   . Spinal stenosis of lumbosacral region   . Spinal stenosis, thoracic   . Degenerative arthritis     PAST SURGICAL HISTORY: Past Surgical History  Procedure Laterality Date  . Replacement total knee bilateral  2004  . Abdominal hysterectomy    . Cholecystectomy    . Back surgery    . Left hand carpal tunnel surgery    . Cataract extraction Right   . Femur im nail Right 06/02/2014    Procedure: INTRAMEDULLARY (IM) RETROGRADE FEMORAL NAILING;  Surgeon: Johnny Bridge, MD;  Location: [place];  Service: Orthopedics;  Laterality: Right;    FAMILY HISTORY: Family History  Problem Relation Age of Onset  . Multiple sclerosis Other     neices.   . Cancer Mother   . Diabetes Mother   . GI Bleed Sister     diverticulitis    SOCIAL HISTORY: History   Social History  . Marital Status: Widowed    Spouse Name: N/A    Number of Children: 2  . Years of Education: 53yr colleg   Occupational History  . NIGHT RESIDENT [employer]    Social History Main Topics  . Smoking status: Never Smoker   . Smokeless tobacco: Never Used  . Alcohol Use: No  . Drug Use: No  . Sexual Activity: No   Other Topics Concern  . Not on file   Social History Narrative   Patient is widowed with 2 children   Patient is right handed   Patient has 2 yrs of college   Patient drinks 2 cups of tea daily      PHYSICAL EXAM  Filed Vitals:   07/18/14 1346  BP: 131/86  Pulse: 122  Height: 5\' 2"  (1.575 m)  Weight: 157 lb (71.215 kg)   Body mass index is 28.71 kg/(m^2).  Generalized: Well developed, in no acute distress   Neurological examination  Mentation:  Alert oriented to time, place, history taking. Follows all commands speech and language fluent Cranial nerve II-XII: Pupils were equal round reactive to light. Extraocular movements were full, visual field were full on confrontational test. Facial sensation and strength were normal. Uvula tongue midline. Head turning and shoulder shrug  were normal and symmetric. Motor: The motor testing reveals 5 over 5 strength in the upper  Extremities. Minimal movement in the legs limited to the ankles.  Sensory: Sensory testing is intact to soft touch on all 4 extremities. No evidence of extinction is noted.  Coordination: Cerebellar testing reveals good finger-nose-finger and heel-to-shin bilaterally.  Gait  and station: [employer].  Reflexes: Deep tendon reflexes are symmetric and normal bilaterally.    DIAGNOSTIC DATA (LABS, IMAGING, TESTING) - I reviewed patient records, labs, notes, testing and imaging myself where available.  Lab Results  Component Value Date   WBC 9.2 06/05/2014   HGB 9.6* 06/05/2014   HCT 30.0* 06/05/2014   MCV 88.5 06/05/2014   PLT 249 06/05/2014      Component Value Date/Time   NA 141 06/04/2014 0648   NA 139 01/05/2014 1406   K 3.9 06/04/2014 0648   CL 104 06/04/2014 0648   CO2 23 06/04/2014 0648   GLUCOSE 108* 06/04/2014 0648   GLUCOSE 170* 01/05/2014 1406   BUN 9 06/04/2014 0648   BUN 19 01/05/2014 1406   CREATININE 0.51 06/04/2014 0648   CALCIUM 9.3 06/04/2014 0648   PROT 6.8 06/01/2014 1653   PROT 6.2 01/05/2014 1406   ALBUMIN 3.6 06/01/2014 1653   AST 18 06/01/2014 1653   ALT 15 06/01/2014 1653   ALKPHOS 62 06/01/2014 1653   BILITOT 1.2 06/01/2014 1653   GFRNONAA >90 06/04/2014 0648   GFRAA >90 06/04/2014 0648    Lab Results  Component Value Date   HGBA1C 7.6* 07/21/2012   No results found for: [id number] Lab Results  Component Value Date   TSH 0.102* 07/21/2012      ASSESSMENT AND PLAN 74 y.o. year old female  has a past medical  history of Hypertension; Multiple sclerosis; Diabetes mellitus without complication; Neuromuscular disorder; Gait disturbance; Obesity; Spinal stenosis in cervical region; Spinal stenosis of lumbosacral region; Spinal stenosis, thoracic; and Degenerative arthritis. here with;  1. Multiple sclerosis 2. Abnormality of gait 3. Spastic paraparesis  Overall the patient has remained the same. She has not had any additional exacerbations. The patient did fracture her leg and is now in rehabilitation at [facility]. She will continue taking Tecfidera and baclofen. The patient had an MRI of the brain in August that showed interval progression of cerebral White matter disease, no enhancing lesions. The patient recently had blood work while in the hospital. If her symptoms worsen or she develops new symptoms she should let us know. Otherwise she will follow up in 4 months with Dr. Lucio Edward, MSN, NP-C 07/18/2014, 1:35 PM [facility] [address] [place], [place] [address] [phone]  Note: This document was prepared with digital dictation and possible smart phrase technology. Any transcriptional errors that result from this process are unintentional.

## 2014-11-22 ENCOUNTER — Encounter: Payer: Self-pay | Admitting: Neurology

## 2014-11-22 ENCOUNTER — Ambulatory Visit (INDEPENDENT_AMBULATORY_CARE_PROVIDER_SITE_OTHER): Payer: Medicare (Managed Care) | Admitting: Neurology

## 2014-11-22 VITALS — BP 133/87 | HR 75 | Ht 62.0 in | Wt 155.0 lb

## 2014-11-22 DIAGNOSIS — R269 Unspecified abnormalities of gait and mobility: Secondary | ICD-10-CM

## 2014-11-22 DIAGNOSIS — N319 Neuromuscular dysfunction of bladder, unspecified: Secondary | ICD-10-CM | POA: Diagnosis not present

## 2014-11-22 DIAGNOSIS — Z5181 Encounter for therapeutic drug level monitoring: Secondary | ICD-10-CM | POA: Diagnosis not present

## 2014-11-22 DIAGNOSIS — G35 Multiple sclerosis: Secondary | ICD-10-CM

## 2014-11-22 MED ORDER — PREDNISONE 5 MG PO TABS: ORAL_TABLET | ORAL | Status: DC

## 2014-11-22 NOTE — Progress Notes (Signed)
Reason for visit: Multiple sclerosis  Laura Roberts is an 74 y.o. female  History of present illness:  Laura Roberts is a 74 year old right-handed black female with a history of multiple sclerosis. She is wheelchair bound, and she has a neurogenic bladder. She requires aid and assistance, which she gets twice a day for 7 days a week. She is able to fix herself meals once she is in her wheelchair. She requires assistance with transfers, bathing and dressing. She is on Tecfidera, which she is tolerating medication well. Once again, no other new symptoms have occurred since last seen. The patient is on low-dose prednisone taking 5 mg alternating with 2.5 mg every other day. Without the medication, she does not function well.  Past Medical History  Diagnosis Date  . Hypertension   . Multiple sclerosis   . Diabetes mellitus without complication   . Neuromuscular disorder     Bilateral hand carpal tunnel syndrome  . Gait disturbance   . Obesity   . Spinal stenosis in cervical region   . Spinal stenosis of lumbosacral region   . Spinal stenosis, thoracic   . Degenerative arthritis     Past Surgical History  Procedure Laterality Date  . Replacement total knee bilateral  2004  . Abdominal hysterectomy    . Cholecystectomy    . Back surgery    . Left hand carpal tunnel surgery    . Cataract extraction Right   . Femur im nail Right 06/02/2014    Procedure: INTRAMEDULLARY (IM) RETROGRADE FEMORAL NAILING;  Surgeon: Johnny Bridge, MD;  Location: [place] [facility];  Service: Orthopedics;  Laterality: Right;    Family History  Problem Relation Age of Onset  . Multiple sclerosis Other     neices.   . Cancer Mother   . Diabetes Mother   . GI Bleed Sister     diverticulitis    Social history:  reports that she has never smoked. She has never used smokeless tobacco. She reports that she does not drink alcohol or use illicit drugs.    Allergies  Allergen Reactions  . Codeine Other (See  Comments)    Difficult breathing and skin problem  . Ultram [Tramadol] Other (See Comments)    Difficult breathing and skin peeling    Medications:  Prior to Admission medications   Medication Sig Start Date End Date Taking? Authorizing Provider  acetaminophen (TYLENOL) 325 MG tablet Take 650 mg by mouth every 6 (six) hours as needed.   Yes Historical Provider, MD  amLODipine (NORVASC) 5 MG tablet Take 5 mg by mouth at bedtime.    Yes Historical Provider, MD  baclofen (LIORESAL) 10 MG tablet One tablet in the morning and midday, two tablets at night 07/07/13  Yes Kathrynn Ducking, MD  cholecalciferol (VITAMIN D) 1000 UNITS tablet Take 1,000 Units by mouth daily.   Yes Historical Provider, MD  Dimethyl Fumarate (TECFIDERA) 240 MG CPDR Take 240 mg by mouth 2 (two) times daily.   Yes Historical Provider, MD  DULoxetine (CYMBALTA) 30 MG capsule Take 30 mg by mouth daily. Take with 60mg  for a total of 90mg    Yes Historical Provider, MD  DULoxetine (CYMBALTA) 60 MG capsule Take 60 mg by mouth daily.   Yes Historical Provider, MD  enoxaparin (LOVENOX) 40 MG/0.4ML injection Inject 0.4 mLs (40 mg total) into the skin daily. 06/06/14  Yes Marchia Bond, MD  gabapentin (NEURONTIN) 300 MG capsule Take 300 mg by mouth 3 (three) times daily.  Yes Historical Provider, MD  glimepiride (AMARYL) 2 MG tablet Take 2 mg by mouth 2 (two) times daily.   Yes Historical Provider, MD  insulin glargine (LANTUS) 100 UNIT/ML injection Inject 0.1 mLs (10 Units total) into the skin at bedtime. 06/06/14  Yes Tasrif Ahmed, MD  metFORMIN (GLUCOPHAGE) 1000 MG tablet Take 1,000 mg by mouth 2 (two) times daily with a meal.   Yes Historical Provider, MD  omeprazole (PRILOSEC) 20 MG capsule Take 20 mg by mouth daily.   Yes Historical Provider, MD  oxybutynin (DITROPAN) 5 MG tablet Take 5 mg by mouth 2 (two) times daily. 10/01/12  Yes Meredith Staggers, MD  polyethylene glycol [facility] / Floria Raveling) packet Take 17 g by mouth daily as needed  for mild constipation. 06/06/14  Yes Tasrif Ahmed, MD  predniSONE (DELTASONE) 5 MG tablet 1 tablet on odd days, one half tablet on even days 11/22/14  Yes Kathrynn Ducking, MD  traMADol (ULTRAM) 50 MG tablet Take 50 mg by mouth every 6 (six) hours as needed.   Yes Historical Provider, MD  valsartan (DIOVAN) 320 MG tablet Take 320 mg by mouth at bedtime.    Yes Historical Provider, MD  vitamin B-12 (CYANOCOBALAMIN) 100 MCG tablet Take 100 mcg by mouth daily.   Yes Historical Provider, MD    ROS:  Out of a complete 14 system review of symptoms, the patient complains only of the following symptoms, and all other reviewed systems are negative.  Decreased appetite and activity, fatigue, weight gain Restless legs Joint pain, back pain, walking difficulty Numbness, weakness Depression  Blood pressure 133/87, pulse 75, height 5\' 2"  (1.575 m), weight 155 lb (70.308 kg).  Physical Exam  General: The patient is alert and cooperative at the time of the examination.  Neuromuscular: The patient has the ability to abduct the left arm only to about 90, has better range of motion movement of the right arm.  Skin: 1+ edema of ankles is noted bilaterally.   Neurologic Exam  Mental status: The patient is alert and oriented x 3 at the time of the examination. The patient has apparent normal recent and remote memory, with an apparently normal attention span and concentration ability.   Cranial nerves: Facial symmetry is present. Speech is normal, no aphasia or dysarthria is noted. Extraocular movements are full. Visual fields are full. Pupils are equal, round, and reactive to light. Discs are flat bilaterally.  Motor: The patient has good strength in the upper extremities. The patient has no voluntary motor activity of the lower extremities.  Sensory examination: Soft touch sensation is symmetric on the face, arms, and legs.  Coordination: The patient has good finger-nose-finger bilaterally. The  patient is unable to perform heel-to-shin on either side.  Gait and station: The patient is wheelchair-bound, she cannot be ambulated.  Reflexes: Deep tendon reflexes are symmetric, but are depressed.   Assessment/Plan:  1. Multiple sclerosis  2. Spastic paraparesis  3. Neurogenic bladder  The patient has significant disability associated with her multiple sclerosis. She is having some on going spasms of the lower extremities, she remains on baclofen. She does not believe she needs to go up on the medication. She will continue Tecfidera, blood work will be done today, she will follow-up in 6 months.  Jill Alexanders MD 11/22/2014 9:01 PM  [facility] [address] [place], [place] [address]  Phone [phone] Fax [phone]

## 2014-11-23 ENCOUNTER — Telehealth: Payer: Self-pay | Admitting: Neurology

## 2014-11-23 LAB — CBC WITH DIFFERENTIAL/PLATELET
BASOS: 0 %
Basophils Absolute: 0 10*3/uL (ref 0.0–0.2)
EOS (ABSOLUTE): 0.1 10*3/uL (ref 0.0–0.4)
Eos: 1 %
HEMATOCRIT: 39.8 % (ref 34.0–46.6)
HEMOGLOBIN: 13.1 g/dL (ref 11.1–15.9)
Immature Grans (Abs): 0 10*3/uL (ref 0.0–0.1)
Immature Granulocytes: 0 %
LYMPHS ABS: 1.1 10*3/uL (ref 0.7–3.1)
Lymphs: 10 %
MCH: 30 pg (ref 26.6–33.0)
MCHC: 32.9 g/dL (ref 31.5–35.7)
MCV: 91 fL (ref 79–97)
MONOS ABS: 0.9 10*3/uL (ref 0.1–0.9)
Monocytes: 8 %
NEUTROS ABS: 8.8 10*3/uL — AB (ref 1.4–7.0)
Neutrophils: 81 %
Platelets: 317 10*3/uL (ref 150–379)
RBC: 4.37 x10E6/uL (ref 3.77–5.28)
RDW: 14.6 % (ref 12.3–15.4)
WBC: 10.9 10*3/uL — AB (ref 3.4–10.8)

## 2014-11-23 LAB — COMPREHENSIVE METABOLIC PANEL
ALK PHOS: 75 IU/L (ref 39–117)
ALT: 31 IU/L (ref 0–32)
AST: 17 IU/L (ref 0–40)
Albumin/Globulin Ratio: 2.3 (ref 1.1–2.5)
Albumin: 4.3 g/dL (ref 3.5–4.8)
BILIRUBIN TOTAL: 0.7 mg/dL (ref 0.0–1.2)
BUN / CREAT RATIO: 15 (ref 11–26)
BUN: 10 mg/dL (ref 8–27)
CHLORIDE: 99 mmol/L (ref 97–108)
CO2: 27 mmol/L (ref 18–29)
Calcium: 10.5 mg/dL — ABNORMAL HIGH (ref 8.7–10.3)
Creatinine, Ser: 0.67 mg/dL (ref 0.57–1.00)
GFR calc non Af Amer: 88 mL/min/{1.73_m2} (ref >59)
GFR, EST AFRICAN AMERICAN: 102 mL/min/{1.73_m2} (ref >59)
GLOBULIN, TOTAL: 1.9 g/dL (ref 1.5–4.5)
Glucose: 42 mg/dL — ABNORMAL LOW (ref 65–99)
Potassium: 4.1 mmol/L (ref 3.5–5.2)
SODIUM: 142 mmol/L (ref 134–144)
Total Protein: 6.2 g/dL (ref 6.0–8.5)

## 2014-11-23 NOTE — Telephone Encounter (Signed)
I called patient. The patient had blood work showing a low glucose level XLII, she indicates that she was not symptomatically with this. The white blood count is slightly elevated, but the patient is on low-dose prednisone. The calcium level is minimally elevated. I discussed this with her. The patient indicates that she is having back pain, she indicates that she also has developed a cough, she will be seen by her primary care physician today. She requests sterile is for what she thinks is a MS exacerbation, I indicated that we need to hold off until we figure out what is going on with the cough, nature she doesn't have a bladder infection or fever. The back pain began yesterday afternoon.

## 2014-11-30 ENCOUNTER — Other Ambulatory Visit: Payer: Self-pay | Admitting: Family Medicine

## 2014-11-30 ENCOUNTER — Ambulatory Visit
Admission: RE | Admit: 2014-11-30 | Discharge: 2014-11-30 | Disposition: A | Discharge status: Home / Self Care | Payer: Medicare (Managed Care) | Source: Ambulatory Visit | Attending: Family Medicine | Admitting: Family Medicine

## 2014-11-30 DIAGNOSIS — R059 Cough, unspecified: Secondary | ICD-10-CM

## 2014-11-30 DIAGNOSIS — J189 Pneumonia, unspecified organism: Secondary | ICD-10-CM

## 2014-11-30 DIAGNOSIS — J181 Lobar pneumonia, unspecified organism: Secondary | ICD-10-CM

## 2014-11-30 DIAGNOSIS — R05 Cough: Secondary | ICD-10-CM

## 2014-12-06 ENCOUNTER — Ambulatory Visit: Payer: Medicare Other | Admitting: Neurology

## 2015-01-13 ENCOUNTER — Ambulatory Visit
Admission: RE | Admit: 2015-01-13 | Discharge: 2015-01-13 | Disposition: A | Discharge status: Home / Self Care | Payer: No Typology Code available for payment source | Source: Ambulatory Visit | Attending: Internal Medicine | Admitting: Internal Medicine

## 2015-01-13 ENCOUNTER — Other Ambulatory Visit: Payer: Self-pay | Admitting: Internal Medicine

## 2015-01-13 DIAGNOSIS — M2560 Stiffness of unspecified joint, not elsewhere classified: Secondary | ICD-10-CM

## 2015-01-13 DIAGNOSIS — M25511 Pain in right shoulder: Secondary | ICD-10-CM

## 2015-05-30 ENCOUNTER — Ambulatory Visit (INDEPENDENT_AMBULATORY_CARE_PROVIDER_SITE_OTHER): Payer: Medicare (Managed Care) | Admitting: Neurology

## 2015-05-30 ENCOUNTER — Encounter: Payer: Self-pay | Admitting: Neurology

## 2015-05-30 VITALS — BP 141/91 | HR 82 | Ht 62.0 in | Wt 172.0 lb

## 2015-05-30 DIAGNOSIS — Z5181 Encounter for therapeutic drug level monitoring: Secondary | ICD-10-CM | POA: Diagnosis not present

## 2015-05-30 DIAGNOSIS — G35 Multiple sclerosis: Secondary | ICD-10-CM | POA: Diagnosis not present

## 2015-05-30 DIAGNOSIS — R269 Unspecified abnormalities of gait and mobility: Secondary | ICD-10-CM

## 2015-05-30 DIAGNOSIS — G5790 Unspecified mononeuropathy of unspecified lower limb: Secondary | ICD-10-CM

## 2015-05-30 DIAGNOSIS — G8220 Paraplegia, unspecified: Secondary | ICD-10-CM

## 2015-05-30 DIAGNOSIS — M792 Neuralgia and neuritis, unspecified: Secondary | ICD-10-CM

## 2015-05-30 NOTE — Progress Notes (Signed)
Reason for visit: Multiple sclerosis  Laura Roberts is an 74 y.o. female  History of present illness:  Laura Roberts is a 74 year old right-handed black female with a history of multiple sclerosis. The patient is on Tecfidera, and she is tolerating the medication well. She indicates that she has had ongoing progression of lower extremity weakness. The [facility] through [facility], and she has aid and assistance daily. The patient denies any skin breakdown issues. She does have neurogenic bowel bladder, she may have occasional incontinence. She uses a power wheelchair for mobility. She has not had any falls. She does have some spasms in the legs at night, she may have some sharp shooting pains in her thighs. She takes gabapentin, but she has not been taking this on a scheduled basis until just recently. She has diabetes, her most recent hemoglobin A1c was 7.0. She returns for an evaluation. No changes in strength in the arms has been noted, no visual changes have been noted. She has had injections in both shoulders recently for degenerative arthritis.  Past Medical History  Diagnosis Date  . Hypertension   . Multiple sclerosis ([place])   . Diabetes mellitus without complication ([place])   . Neuromuscular disorder (HCC)     Bilateral hand carpal tunnel syndrome  . Gait disturbance   . Obesity   . Spinal stenosis in cervical region   . Spinal stenosis of lumbosacral region   . Spinal stenosis, thoracic   . Degenerative arthritis     Past Surgical History  Procedure Laterality Date  . Replacement total knee bilateral  2004  . Abdominal hysterectomy    . Cholecystectomy    . Back surgery    . Left hand carpal tunnel surgery    . Cataract extraction Right   . Femur im nail Right 06/02/2014    Procedure: INTRAMEDULLARY (IM) RETROGRADE FEMORAL NAILING;  Surgeon: Johnny Bridge, MD;  Location: [facility];  Service: Orthopedics;  Laterality: Right;    Family History  Problem Relation Age of  Onset  . Multiple sclerosis Other     neices.   . Cancer Mother   . Diabetes Mother   . GI Bleed Sister     diverticulitis    Social history:  reports that she has never smoked. She has never used smokeless tobacco. She reports that she does not drink alcohol or use illicit drugs.    Allergies  Allergen Reactions  . Codeine Other (See Comments)    Difficult breathing and skin problem  . Ultram [Tramadol] Other (See Comments)    Difficult breathing and skin peeling    Medications:  Prior to Admission medications   Medication Sig Start Date End Date Taking? Authorizing Provider  acetaminophen (TYLENOL) 325 MG tablet Take 650 mg by mouth every 6 (six) hours as needed.   Yes Historical Provider, MD  amLODipine (NORVASC) 5 MG tablet Take 5 mg by mouth at bedtime.    Yes Historical Provider, MD  ARTIFICIAL TEAR OP Apply 1 drop to eye 4 (four) times daily as needed.   Yes Historical Provider, MD  atorvastatin (LIPITOR) 40 MG tablet Take 40 mg by mouth daily.   Yes Historical Provider, MD  baclofen (LIORESAL) 10 MG tablet One tablet in the morning and midday, two tablets at night 07/07/13  Yes Kathrynn Ducking, MD  calcium citrate (CALCITRATE - DOSED IN MG ELEMENTAL CALCIUM) 950 MG tablet Take 200 mg of elemental calcium by mouth daily.   Yes  Historical Provider, MD  cholecalciferol (VITAMIN D) 1000 UNITS tablet Take 1,000 Units by mouth daily.   Yes Historical Provider, MD  Dimethyl Fumarate (TECFIDERA) 240 MG CPDR Take 240 mg by mouth 2 (two) times daily.   Yes Historical Provider, MD  DULoxetine (CYMBALTA) 30 MG capsule Take 30 mg by mouth daily. Take with 60mg  for a total of 90mg    Yes Historical Provider, MD  DULoxetine (CYMBALTA) 60 MG capsule Take 60 mg by mouth daily.   Yes Historical Provider, MD  enoxaparin (LOVENOX) 40 MG/0.4ML injection Inject 0.4 mLs (40 mg total) into the skin daily. 06/06/14  Yes Marchia Bond, MD  gabapentin (NEURONTIN) 300 MG capsule Take 300 mg by mouth 3  (three) times daily.   Yes Historical Provider, MD  glimepiride (AMARYL) 2 MG tablet Take 2 mg by mouth 2 (two) times daily.   Yes Historical Provider, MD  lidocaine (LIDODERM) 5 % Place 1 patch onto the skin daily. Remove & Discard patch within 12 hours or as directed by MD   Yes Historical Provider, MD  omeprazole (PRILOSEC) 20 MG capsule Take 20 mg by mouth daily.   Yes Historical Provider, MD  oxybutynin (DITROPAN) 5 MG tablet Take 5 mg by mouth 2 (two) times daily. 10/01/12  Yes Meredith Staggers, MD  Petrolatum-Zinc Oxide [facility] PROTECTIVE BARRIER EX) Apply topically.   Yes Historical Provider, MD  polyethylene glycol (MIRALAX / GLYCOLAX) packet Take 17 g by mouth daily as needed for mild constipation. 06/06/14  Yes Tasrif Ahmed, MD  predniSONE (DELTASONE) 5 MG tablet 1 tablet on odd days, one half tablet on even days 11/22/14  Yes Kathrynn Ducking, MD  valsartan (DIOVAN) 320 MG tablet Take 320 mg by mouth at bedtime.    Yes Historical Provider, MD  vitamin B-12 (CYANOCOBALAMIN) 100 MCG tablet Take 100 mcg by mouth daily.   Yes Historical Provider, MD    ROS:  Out of a complete 14 system review of symptoms, the patient complains only of the following symptoms, and all other reviewed systems are negative.  Decrease activity, fatigue Eye itching, blurred vision Leg swelling, palpitations Heat intolerance Incontinence of bowel Restless legs Incontinence of bladder Joint pain, back pain Skin wounds, itching Numbness, weakness Depression  Blood pressure 141/91, pulse 82, height 5\' 2"  (1.575 m), weight 172 lb (78.019 kg).  Physical Exam  General: The patient is alert and cooperative at the time of the examination. The patient is moderately obese.  Skin: 1+ edema below the knees is noted bilaterally.   Neurologic Exam  Mental status: The patient is alert and oriented x 3 at the time of the examination. The patient has apparent normal recent and remote memory, with an apparently  normal attention span and concentration ability.   Cranial nerves: Facial symmetry is present. Speech is normal, no aphasia or dysarthria is noted. Extraocular movements are full. Visual fields are full. Pupils are equal, round, and reactive to light. Discs are flat bilaterally.  Motor: The patient has good strength in the upper extremities. With the lower extremities, no voluntary motor control is noted. The patient is able to abduct the arms only to about 85 bilaterally.  Sensory examination: Soft touch sensation is symmetric on the face, arms, and legs.  Coordination: The patient has good finger-nose-finger bilaterally. The patient is unable to perform heel-to-shin on either side.  Gait and station: The patient is wheelchair bound, she cannot be ambulated.  Reflexes: Deep tendon reflexes are symmetric, but are depressed  Assessment/Plan:  One. Multiple sclerosis  2. Paraparesis  The patient will remain on gabapentin for the discomfort. She will continue Tecfidera, we will check blood work today. She will followup in 6 months, sooner if needed. She will contact me if any new issues arise.  Jill Alexanders MD 05/30/2015 8:29 PM  [facility] [address] [place], [place] [address]  Phone [phone] Fax [phone]

## 2015-05-30 NOTE — Patient Instructions (Signed)
Multiple Sclerosis °Multiple sclerosis (MS) is a disease of the central nervous system. It leads to the loss of the insulating covering of the nerves (myelin sheath) of your brain. When this happens, brain signals do not get sent properly or may not get sent at all. The age of onset of MS varies.  °CAUSES °The cause of MS is unknown. However, it is more common in the northern [place] than in the southern [place]. °RISK FACTORS °There is a higher number of women with MS than men. MS is not an illness that is passed down to you from your family members (inherited). However, your risk of MS is higher if you have a relative with MS. °SIGNS AND SYMPTOMS  °The symptoms of MS occur in episodes or attacks. These attacks may last weeks to months. There may be long periods of almost no symptoms between attacks. The symptoms of MS vary. This is because of the many different ways it affects the central nervous system. The main symptoms of MS include: °· Vision problems and eye pain. °· Numbness. °· Weakness. °· Inability to move your arms, hands, feet, or legs (paralysis). °· Balance problems. °· Tremors. °DIAGNOSIS  °Your health care provider can diagnose MS with the help of imaging exams and lab tests. These may include specialized X-ray exams and spinal fluid tests. The best imaging exam to confirm a diagnosis of MS is an MRI. °TREATMENT  °There is no known cure for MS, but there are medicines that can decrease the number and frequency of attacks. Steroids are often used for short-term relief. Physical and occupational therapy may also help. There are also many new alternative or complementary treatments available to help control the symptoms of MS. Ask your health care provider if any of these other options are right for you. °HOME CARE INSTRUCTIONS  °· Take medicines as directed by your health care provider. °· Exercise as directed by your health care provider. °SEEK MEDICAL CARE IF: °You begin to feel  depressed. °SEEK IMMEDIATE MEDICAL CARE IF: °· You develop paralysis. °· You have problems with bladder, bowel, or sexual function. °· You develop mental changes, such as forgetfulness or mood swings. °· You have a period of uncontrolled movements (seizure). °  °This information is not intended to replace advice given to you by your health care provider. Make sure you discuss any questions you have with your health care provider. °  °Document Released: 06/14/2000 Document Revised: 06/22/2013 Document Reviewed: 02/22/2013 °Elsevier Interactive Patient Education ©2016 Elsevier Inc. ° °

## 2015-05-31 ENCOUNTER — Telehealth: Payer: Self-pay

## 2015-05-31 LAB — CBC WITH DIFFERENTIAL/PLATELET
BASOS: 0 %
Basophils Absolute: 0 10*3/uL (ref 0.0–0.2)
EOS (ABSOLUTE): 0 10*3/uL (ref 0.0–0.4)
EOS: 0 %
HEMATOCRIT: 39 % (ref 34.0–46.6)
Hemoglobin: 12.7 g/dL (ref 11.1–15.9)
IMMATURE GRANULOCYTES: 1 %
Immature Grans (Abs): 0.1 10*3/uL (ref 0.0–0.1)
Lymphocytes Absolute: 1 10*3/uL (ref 0.7–3.1)
Lymphs: 7 %
MCH: 29 pg (ref 26.6–33.0)
MCHC: 32.6 g/dL (ref 31.5–35.7)
MCV: 89 fL (ref 79–97)
MONOS ABS: 1.1 10*3/uL — AB (ref 0.1–0.9)
Monocytes: 8 %
NEUTROS ABS: 11.3 10*3/uL — AB (ref 1.4–7.0)
NEUTROS PCT: 84 %
Platelets: 303 10*3/uL (ref 150–379)
RBC: 4.38 x10E6/uL (ref 3.77–5.28)
RDW: 14.3 % (ref 12.3–15.4)
WBC: 13.5 10*3/uL — ABNORMAL HIGH (ref 3.4–10.8)

## 2015-05-31 LAB — COMPREHENSIVE METABOLIC PANEL
ALT: 14 IU/L (ref 0–32)
AST: 15 IU/L (ref 0–40)
Albumin/Globulin Ratio: 1.7 (ref 1.1–2.5)
Albumin: 4.1 g/dL (ref 3.5–4.8)
Alkaline Phosphatase: 87 IU/L (ref 39–117)
BUN/Creatinine Ratio: 22 (ref 11–26)
BUN: 19 mg/dL (ref 8–27)
Bilirubin Total: 0.5 mg/dL (ref 0.0–1.2)
CALCIUM: 10.1 mg/dL (ref 8.7–10.3)
CO2: 24 mmol/L (ref 18–29)
CREATININE: 0.86 mg/dL (ref 0.57–1.00)
Chloride: 102 mmol/L (ref 97–106)
GFR calc Af Amer: 78 mL/min/{1.73_m2} (ref >59)
GFR, EST NON AFRICAN AMERICAN: 68 mL/min/{1.73_m2} (ref >59)
GLOBULIN, TOTAL: 2.4 g/dL (ref 1.5–4.5)
Glucose: 120 mg/dL — ABNORMAL HIGH (ref 65–99)
POTASSIUM: 4.5 mmol/L (ref 3.5–5.2)
SODIUM: 142 mmol/L (ref 136–144)
TOTAL PROTEIN: 6.5 g/dL (ref 6.0–8.5)

## 2015-05-31 NOTE — Telephone Encounter (Signed)
I called the patient and relayed results. 

## 2015-05-31 NOTE — Telephone Encounter (Signed)
-----   Message from Kathrynn Ducking, MD sent at 05/31/2015  7:25 AM EST ----- The blood work results are unremarkable. WBC slightly high, the patient is on prednisone. ----- Message -----    From: [facility] Lab Results In Interface    Sent: 05/31/2015   5:41 AM      To: Kathrynn Ducking, MD

## 2015-11-29 ENCOUNTER — Ambulatory Visit (INDEPENDENT_AMBULATORY_CARE_PROVIDER_SITE_OTHER): Payer: Medicare (Managed Care) | Admitting: Adult Health

## 2015-11-29 ENCOUNTER — Encounter: Payer: Self-pay | Admitting: Adult Health

## 2015-11-29 VITALS — BP 144/82 | HR 78 | Resp 20 | Ht 62.0 in | Wt 176.0 lb

## 2015-11-29 DIAGNOSIS — R269 Unspecified abnormalities of gait and mobility: Secondary | ICD-10-CM | POA: Diagnosis not present

## 2015-11-29 DIAGNOSIS — G35 Multiple sclerosis: Secondary | ICD-10-CM | POA: Diagnosis not present

## 2015-11-29 DIAGNOSIS — M62838 Other muscle spasm: Secondary | ICD-10-CM

## 2015-11-29 DIAGNOSIS — Z5181 Encounter for therapeutic drug level monitoring: Secondary | ICD-10-CM

## 2015-11-29 DIAGNOSIS — M6249 Contracture of muscle, multiple sites: Secondary | ICD-10-CM | POA: Diagnosis not present

## 2015-11-29 MED ORDER — BACLOFEN 10 MG PO TABS: ORAL_TABLET | ORAL | Status: DC

## 2015-11-29 NOTE — Progress Notes (Signed)
I have read the note, and I agree with the clinical assessment and plan.  Jousha Schwandt KEITH   

## 2015-11-29 NOTE — Progress Notes (Signed)
PATIENT: Laura Roberts DOB: May 25, 1941  REASON FOR VISIT: follow up- Multiple sclerosis HISTORY FROM: patient  HISTORY OF PRESENT ILLNESS: Laura Roberts is a 75 year old female with a history of multiple sclerosis. She returns today for follow-up. The patient is on Tecfidera and tolerating it well. She states that she suffered a fall 2 years ago and broke her right leg. She states after the fall she was in rehabilitation for 3 months. She never regained full mobility and has since had progressive weakness in the lower extremities. She is in a motorized wheelchair. She reports no movement of the lower extremity is. She transfers by a lift. She reports incontinence of the bowels and bladder. Denies any changes with her vision. She states that she does have an appointment with her ophthalmologist on Friday for new glasses. She reports that she continues to have muscle spasms from the waist down. She reports baclofen offers some relief. She is also on gabapentin. She was reporting discomfort down the right arm. She states that it starts in her shoulder and she has sharp shooting pains down to the hand. She reports occasionally the hand will lock up on her. She states that she did have a fall several months ago and fell on the right shoulder. She had x-rays that was unremarkable per patient. She returns today for an evaluation.  HISTORY 05/30/15 ([place]): Laura Roberts is a 75 year old right-handed black female with a history of multiple sclerosis. The patient is on Tecfidera, and she is tolerating the medication well. She indicates that she has had ongoing progression of lower extremity weakness. The patient [facility], and she has aid and assistance daily. The patient denies any skin breakdown issues. She does have neurogenic bowel bladder, she may have occasional incontinence. She uses a power wheelchair for mobility. She has not had any falls. She does have some spasms in the legs at night, she  may have some sharp shooting pains in her thighs. She takes gabapentin, but she has not been taking this on a scheduled basis until just recently. She has diabetes, her most recent hemoglobin A1c was 7.0. She returns for an evaluation. No changes in strength in the arms has been noted, no visual changes have been noted. She has had injections in both shoulders recently for degenerative arthritis.  REVIEW OF SYSTEMS: Out of a complete 14 system review of symptoms, the patient complains only of the following symptoms, and all other reviewed systems are negative.  Appetite change, fatigue, runny nose, blurred vision, cough, leg swelling, restless leg, incontinence of bowels, diarrhea, heat intolerance, incontinence of bladder, joint pain, joint swelling, back pain, aching muscles, walking difficulty, rash, itching, depression, weakness, numbness  ALLERGIES: Allergies  Allergen Reactions  . Codeine Other (See Comments)    Difficult breathing and skin problem  . Ultram [Tramadol] Other (See Comments)    Difficult breathing and skin peeling    HOME MEDICATIONS: Outpatient Prescriptions Prior to Visit  Medication Sig Dispense Refill  . acetaminophen (TYLENOL) 325 MG tablet Take 650 mg by mouth every 6 (six) hours as needed.    Marland Kitchen amLODipine (NORVASC) 5 MG tablet Take 10 mg by mouth at bedtime.     . ARTIFICIAL TEAR OP Apply 1 drop to eye 4 (four) times daily as needed.    . baclofen (LIORESAL) 10 MG tablet One tablet in the morning and midday, two tablets at night 120 tablet 5  . calcium citrate (CALCITRATE - DOSED IN MG ELEMENTAL  CALCIUM) 950 MG tablet Take 200 mg of elemental calcium by mouth daily.    . cholecalciferol (VITAMIN D) 1000 UNITS tablet Take 1,000 Units by mouth daily.    . Dimethyl Fumarate (TECFIDERA) 240 MG CPDR Take 240 mg by mouth 2 (two) times daily.    . DULoxetine (CYMBALTA) 30 MG capsule Take 30 mg by mouth daily. Take with 60mg  for a total of 90mg     . DULoxetine (CYMBALTA)  60 MG capsule Take 60 mg by mouth daily.    Marland Kitchen enoxaparin (LOVENOX) 40 MG/0.4ML injection Inject 0.4 mLs (40 mg total) into the skin daily. 21 Syringe 0  . gabapentin (NEURONTIN) 300 MG capsule Take 300 mg by mouth 3 (three) times daily.    Marland Kitchen glimepiride (AMARYL) 2 MG tablet Take 2 mg by mouth 2 (two) times daily.    Marland Kitchen lidocaine (LIDODERM) 5 % Place 1 patch onto the skin daily. Remove & Discard patch within 12 hours or as directed by MD    . omeprazole (PRILOSEC) 20 MG capsule Take 20 mg by mouth daily.    Marland Kitchen oxybutynin (DITROPAN) 5 MG tablet Take 5 mg by mouth 2 (two) times daily.    Marland Kitchen Petrolatum-Zinc Oxide (SENSI-CARE PROTECTIVE BARRIER EX) Apply topically.    . polyethylene glycol (MIRALAX / GLYCOLAX) packet Take 17 g by mouth daily as needed for mild constipation. 14 each 0  . predniSONE (DELTASONE) 5 MG tablet 1 tablet on odd days, one half tablet on even days    . valsartan (DIOVAN) 320 MG tablet Take 320 mg by mouth at bedtime.     . vitamin B-12 (CYANOCOBALAMIN) 100 MCG tablet Take 100 mcg by mouth daily.    Marland Kitchen atorvastatin (LIPITOR) 40 MG tablet Take 40 mg by mouth daily.     No facility-administered medications prior to visit.    PAST MEDICAL HISTORY: Past Medical History  Diagnosis Date  . Hypertension   . Multiple sclerosis ([place])   . Diabetes mellitus without complication ([place])   . Neuromuscular disorder (HCC)     Bilateral hand carpal tunnel syndrome  . Gait disturbance   . Obesity   . Spinal stenosis in cervical region   . Spinal stenosis of lumbosacral region   . Spinal stenosis, thoracic   . Degenerative arthritis     PAST SURGICAL HISTORY: Past Surgical History  Procedure Laterality Date  . Replacement total knee bilateral  2004  . Abdominal hysterectomy    . Cholecystectomy    . Back surgery    . Left hand carpal tunnel surgery    . Cataract extraction Right   . Femur im nail Right 06/02/2014    Procedure: INTRAMEDULLARY (IM) RETROGRADE FEMORAL NAILING;   Surgeon: Johnny Bridge, MD;  Location: [facility];  Service: Orthopedics;  Laterality: Right;    FAMILY HISTORY: Family History  Problem Relation Age of Onset  . Multiple sclerosis Other     neices.   . Cancer Mother   . Diabetes Mother   . GI Bleed Sister     diverticulitis    SOCIAL HISTORY: Social History   Social History  . Marital Status: Widowed    Spouse Name: N/A  . Number of Children: 2  . Years of Education: 26yr colleg   Occupational History  . NIGHT RESIDENT [employer]    Social History Main Topics  . Smoking status: Never Smoker   . Smokeless tobacco: Never Used  . Alcohol Use: No  . Drug Use: No  .  Sexual Activity: No   Other Topics Concern  . Not on file   Social History Narrative   Patient is widowed with 2 children   Patient is right handed   Patient has 2 yrs of college   Patient drinks 2-3 cups of tea daily      PHYSICAL EXAM  Filed Vitals:   11/29/15 1307  BP: 144/82  Pulse: 78  Resp: 20  Height: 5\' 2"  (1.575 m)  Weight: 176 lb (79.833 kg)   Body mass index is 32.18 kg/(m^2).  Generalized: Well developed, in no acute distress   Neurological examination  Mentation: Alert oriented to time, place, history taking. Follows all commands speech and language fluent Cranial nerve II-XII: Pupils were equal round reactive to light. Extraocular movements were full, visual field were full on confrontational test. Facial sensation and strength were normal. Uvula tongue midline. Head turning and shoulder shrug  were normal and symmetric. Motor: The motor testing reveals 5 over 5 strength in the upper extremities. No movement in the lower extremity. Limited range of motion in the upper extremities.   Sensory: Sensory testing is intact to soft touch on all 4 extremities. No evidence of extinction is noted.  Coordination: Cerebellar testing reveals good finger-nose-finger bilaterally but unable to do heel-to-shin bilaterally.  Gait and station: Patient is in a  motorized wheelchair Reflexes: Deep tendon reflexes are symmetric and normal bilaterally.   DIAGNOSTIC DATA (LABS, IMAGING, TESTING) - I reviewed patient records, labs, notes, testing and imaging myself where available.  Lab Results  Component Value Date   WBC 13.5* 05/30/2015   HGB 9.6* 06/05/2014   HCT 39.0 05/30/2015   MCV 89 05/30/2015   PLT 303 05/30/2015      Component Value Date/Time   NA 142 05/30/2015 1054   NA 141 06/04/2014 0648   K 4.5 05/30/2015 1054   CL 102 05/30/2015 1054   CO2 24 05/30/2015 1054   GLUCOSE 120* 05/30/2015 1054   GLUCOSE 108* 06/04/2014 0648   BUN 19 05/30/2015 1054   BUN 9 06/04/2014 0648   CREATININE 0.86 05/30/2015 1054   CALCIUM 10.1 05/30/2015 1054   PROT 6.5 05/30/2015 1054   PROT 6.8 06/01/2014 1653   ALBUMIN 4.1 05/30/2015 1054   ALBUMIN 3.6 06/01/2014 1653   AST 15 05/30/2015 1054   ALT 14 05/30/2015 1054   ALKPHOS 87 05/30/2015 1054   BILITOT 0.5 05/30/2015 1054   BILITOT 1.2 06/01/2014 1653   GFRNONAA 68 05/30/2015 1054   GFRAA 78 05/30/2015 1054    ASSESSMENT AND PLAN 75 y.o. year old female  has a past medical history of Hypertension; Multiple sclerosis ([place]); Diabetes mellitus without complication ([place]); Neuromuscular disorder ([place]); Gait disturbance; Obesity; Spinal stenosis in cervical region; Spinal stenosis of lumbosacral region; Spinal stenosis, thoracic; and Degenerative arthritis. here with:  1. Multiple sclerosis 2. Abnormality of gait 3. Spasticity lower extremities  The patient will continue on Tecfidera. I will check blood work today. The patient is having some discomfort down the right arm that has been ongoing for a couple months. I have recommended an MRI of the cervical spine however the patient would like to hold off at this time. I will increase her baclofen to 1-1/2 tablets in the morning, 1 tablet at noon and 2  tablets in the evening. In the future we may repeat MRI of the brain and thoracic spine to  evaluate  multiple sclerosis. Patient advised that if her symptoms worsen or she develops any new  symptoms she should let us know. She will follow-up in 6 months or sooner if needed.  Ward Givens, MSN, NP-C 11/29/2015, 1:09 PM [facility] [address] [place], [place] [address] [phone]

## 2015-11-29 NOTE — Patient Instructions (Signed)
Continue Tecfidera Blood work today Increase Baclofen to 1.5 tablets in the AM, 1 tablet at lunch and 2 tablets at bedtime If your symptoms worsen or you develop new symptoms please let us know.

## 2015-11-30 ENCOUNTER — Telehealth: Payer: Self-pay

## 2015-11-30 LAB — COMPREHENSIVE METABOLIC PANEL
ALBUMIN: 3.6 g/dL (ref 3.5–4.8)
ALK PHOS: 88 IU/L (ref 39–117)
ALT: 7 IU/L (ref 0–32)
AST: 11 IU/L (ref 0–40)
Albumin/Globulin Ratio: 1.4 (ref 1.2–2.2)
BUN / CREAT RATIO: 17 (ref 12–28)
BUN: 9 mg/dL (ref 8–27)
Bilirubin Total: 0.2 mg/dL (ref 0.0–1.2)
CHLORIDE: 97 mmol/L (ref 96–106)
CO2: 29 mmol/L (ref 18–29)
Calcium: 9.7 mg/dL (ref 8.7–10.3)
Creatinine, Ser: 0.53 mg/dL — ABNORMAL LOW (ref 0.57–1.00)
GFR calc Af Amer: 109 mL/min/{1.73_m2} (ref >59)
GFR calc non Af Amer: 94 mL/min/{1.73_m2} (ref >59)
Globulin, Total: 2.6 g/dL (ref 1.5–4.5)
Glucose: 207 mg/dL — ABNORMAL HIGH (ref 65–99)
Potassium: 3.7 mmol/L (ref 3.5–5.2)
Sodium: 143 mmol/L (ref 134–144)
Total Protein: 6.2 g/dL (ref 6.0–8.5)

## 2015-11-30 LAB — CBC WITH DIFFERENTIAL/PLATELET
BASOS ABS: 0 10*3/uL (ref 0.0–0.2)
Basos: 0 %
EOS (ABSOLUTE): 0.1 10*3/uL (ref 0.0–0.4)
Eos: 1 %
HEMOGLOBIN: 11.8 g/dL (ref 11.1–15.9)
Hematocrit: 36.6 % (ref 34.0–46.6)
Immature Grans (Abs): 0 10*3/uL (ref 0.0–0.1)
Immature Granulocytes: 0 %
Lymphocytes Absolute: 1.2 10*3/uL (ref 0.7–3.1)
Lymphs: 12 %
MCH: 27.7 pg (ref 26.6–33.0)
MCHC: 32.2 g/dL (ref 31.5–35.7)
MCV: 86 fL (ref 79–97)
MONOCYTES: 6 %
MONOS ABS: 0.6 10*3/uL (ref 0.1–0.9)
Neutrophils Absolute: 8.6 10*3/uL — ABNORMAL HIGH (ref 1.4–7.0)
Neutrophils: 81 %
Platelets: 442 10*3/uL — ABNORMAL HIGH (ref 150–379)
RBC: 4.26 x10E6/uL (ref 3.77–5.28)
RDW: 14.6 % (ref 12.3–15.4)
WBC: 10.6 10*3/uL (ref 3.4–10.8)

## 2015-11-30 NOTE — Telephone Encounter (Signed)
-----   Message from Ward Givens, NP sent at 11/30/2015  1:15 PM EDT ----- Lab work ok. Platelets is slightly elevated. We will continue to monitor. Please call patient

## 2015-11-30 NOTE — Telephone Encounter (Signed)
I spoke to patient and she is aware of results and voiced understanding.

## 2016-03-11 ENCOUNTER — Telehealth: Payer: Self-pay | Admitting: Neurology

## 2016-03-11 MED ORDER — DIMETHYL FUMARATE 240 MG PO CPDR: 240.0000 mg | DELAYED_RELEASE_CAPSULE | Freq: Two times a day (BID) | ORAL | 11 refills | Status: DC

## 2016-03-11 NOTE — Telephone Encounter (Signed)
[place] [phone] opt 3   (f) [phone] called request refill for Dimethyl Fumarate (TECFIDERA) 240 MG CPDR  REF# [id number]

## 2016-03-11 NOTE — Telephone Encounter (Signed)
Refills e-scribed to specialty pharmacy as requested.

## 2016-03-12 NOTE — Telephone Encounter (Signed)
[facility] [phone] called to advise that per the pt's insurance, Dr W is not contracted with [facility].  She said she is totally confused. Please call  [phone] to start the paperwork. Dr Bradd Burner was prescribing it previously.

## 2016-03-12 NOTE — Telephone Encounter (Signed)
Called # [phone] as requested. However, this was PA line for [facility] and they do not yet have rx request for Tecfidera on file.

## 2016-03-12 NOTE — Telephone Encounter (Addendum)
Called [facility] back and they reported that Dr. Jannifer Franklin is not listed as a prescriber w/ [facility] and our office will need to call. Was given a different telephone # [phone] but this # also was [facility] for prior authorization.

## 2016-03-13 MED ORDER — DIMETHYL FUMARATE 240 MG PO CPDR: 240.0000 mg | DELAYED_RELEASE_CAPSULE | Freq: Two times a day (BID) | ORAL | 11 refills | Status: DC

## 2016-03-13 NOTE — Telephone Encounter (Addendum)
Discovered that pt is a participant of [facility]. Called [facility] at [phone] and spoke to [place] in the clinic. She said that rx can be faxed to [phone]. Then, it will be co-signed by [facility] provider and filled through [facility]. Tecfidera rx printed, awaiting signature.

## 2016-03-13 NOTE — Addendum Note (Signed)
Addended by: Monte Fantasia on: 03/13/2016 04:34 PM   Modules accepted: Orders

## 2016-03-14 NOTE — Telephone Encounter (Signed)
Rx printed and faxed to [facility].

## 2016-06-03 ENCOUNTER — Ambulatory Visit (INDEPENDENT_AMBULATORY_CARE_PROVIDER_SITE_OTHER): Payer: Medicare (Managed Care) | Admitting: Neurology

## 2016-06-03 ENCOUNTER — Encounter: Payer: Self-pay | Admitting: Neurology

## 2016-06-03 VITALS — BP 101/68 | HR 102 | Ht 62.0 in | Wt 176.0 lb

## 2016-06-03 DIAGNOSIS — G35 Multiple sclerosis: Secondary | ICD-10-CM | POA: Diagnosis not present

## 2016-06-03 DIAGNOSIS — Z5181 Encounter for therapeutic drug level monitoring: Secondary | ICD-10-CM

## 2016-06-03 DIAGNOSIS — N319 Neuromuscular dysfunction of bladder, unspecified: Secondary | ICD-10-CM

## 2016-06-03 DIAGNOSIS — R269 Unspecified abnormalities of gait and mobility: Secondary | ICD-10-CM

## 2016-06-03 NOTE — Progress Notes (Signed)
Reason for visit: Multiple sclerosis  Laura Roberts is an 75 y.o. female   History of present illness:  Laura Roberts is a 75 year old right-handed black female with a history of multiple sclerosis with a spastic paraparesis. The patient has had good improvement in pain with the use of baclofen which reduces spasticity in the legs. The patient still has discomfort with the left shoulder secondary to intrinsic shoulder disease. She has had injections in the shoulder without much benefit. She remains on low-dose prednisone, and she takes Tecfidera. The patient is not ambulatory, she has an indwelling catheter in the bladder secondary to a neurogenic bladder. The patient uses a motorized wheelchair for mobility. She reports no change in arm strength, vision, or memory since last seen. The patient is on Tecfidera, she is tolerating this well.  Past Medical History:  Diagnosis Date  . Degenerative arthritis   . Diabetes mellitus without complication ([place])   . Gait disturbance   . Hypertension   . Multiple sclerosis ([place])   . Neuromuscular disorder (HCC)    Bilateral hand carpal tunnel syndrome  . Obesity   . Spinal stenosis in cervical region   . Spinal stenosis of lumbosacral region   . Spinal stenosis, thoracic     Past Surgical History:  Procedure Laterality Date  . ABDOMINAL HYSTERECTOMY    . BACK SURGERY    . CATARACT EXTRACTION Right   . CHOLECYSTECTOMY    . FEMUR IM NAIL Right 06/02/2014   Procedure: INTRAMEDULLARY (IM) RETROGRADE FEMORAL NAILING;  Surgeon: Johnny Bridge, MD;  Location: [facility];  Service: Orthopedics;  Laterality: Right;  . left hand carpal tunnel surgery    . REPLACEMENT TOTAL KNEE BILATERAL  2004    Family History  Problem Relation Age of Onset  . Multiple sclerosis Other     neices.   . Cancer Mother   . Diabetes Mother   . GI Bleed Sister     diverticulitis    Social history:  reports that she has never smoked. She has never used smokeless  tobacco. She reports that she does not drink alcohol or use drugs.    Allergies  Allergen Reactions  . Codeine Other (See Comments)    Difficult breathing and skin problem  . Ultram [Tramadol] Other (See Comments)    Difficult breathing and skin peeling  . Januvia [Sitagliptin] Rash    Blisters    Medications:  Prior to Admission medications   Medication Sig Start Date End Date Taking? Authorizing Provider  acetaminophen (TYLENOL) 325 MG tablet Take 650 mg by mouth every 6 (six) hours as needed.   Yes Historical Provider, MD  amLODipine (NORVASC) 5 MG tablet Take 10 mg by mouth at bedtime.    Yes Historical Provider, MD  ARTIFICIAL TEAR OP Apply 1 drop to eye 4 (four) times daily as needed.   Yes Historical Provider, MD  baclofen (LIORESAL) 10 MG tablet 1.5 tablet in the morning, 1 tablet midday, two tablets at night 11/29/15  Yes Ward Givens, NP  calcium citrate (CALCITRATE - DOSED IN MG ELEMENTAL CALCIUM) 950 MG tablet Take 200 mg of elemental calcium by mouth daily.   Yes Historical Provider, MD  cholecalciferol (VITAMIN D) 1000 UNITS tablet Take 1,000 Units by mouth daily.   Yes Historical Provider, MD  Dimethyl Fumarate (TECFIDERA) 240 MG CPDR Take 1 capsule (240 mg total) by mouth 2 (two) times daily. 03/13/16  Yes Kathrynn Ducking, MD  DULoxetine (CYMBALTA) 30 MG  capsule Take 30 mg by mouth daily. Take with 60mg  for a total of 90mg    Yes Historical Provider, MD  DULoxetine (CYMBALTA) 60 MG capsule Take 60 mg by mouth daily.   Yes Historical Provider, MD  gabapentin (NEURONTIN) 300 MG capsule Take 300 mg by mouth 3 (three) times daily.   Yes Historical Provider, MD  glimepiride (AMARYL) 2 MG tablet Take 2 mg by mouth 2 (two) times daily.   Yes Historical Provider, MD  lidocaine (LIDODERM) 5 % Place 1 patch onto the skin daily. Remove & Discard patch within 12 hours or as directed by MD   Yes Historical Provider, MD  omeprazole (PRILOSEC) 20 MG capsule Take 20 mg by mouth daily.    Yes Historical Provider, MD  Petrolatum-Zinc Oxide (SENSI-CARE PROTECTIVE BARRIER EX) Apply topically.   Yes Historical Provider, MD  polyethylene glycol (MIRALAX / GLYCOLAX) packet Take 17 g by mouth daily as needed for mild constipation. 06/06/14  Yes Tasrif Ahmed, MD  predniSONE (DELTASONE) 5 MG tablet 1 tablet on odd days, one half tablet on even days 11/22/14  Yes Kathrynn Ducking, MD  valsartan (DIOVAN) 320 MG tablet Take 320 mg by mouth at bedtime.    Yes Historical Provider, MD    ROS:  Out of a complete 14 system review of symptoms, the patient complains only of the following symptoms, and all other reviewed systems are negative.  Leg weakness Left shoulder pain  Blood pressure 101/68, pulse (!) 102, height 5\' 2"  (1.575 m), weight 176 lb (79.8 kg).  Physical Exam  General: The patient is alert and cooperative at the time of the examination.  Skin: No significant peripheral edema is noted.   Neurologic Exam  Mental status: The patient is alert and oriented x 3 at the time of the examination. The patient has apparent normal recent and remote memory, with an apparently normal attention span and concentration ability.   Cranial nerves: Facial symmetry is present. Speech is normal, no aphasia or dysarthria is noted. Extraocular movements are full. Visual fields are full. Pupils are equal, round, and reactive to light.   Motor: The patient has good strength in the upper extremities. The patient has no voluntary movement of the lower extremities.  Sensory examination: Soft touch sensation is symmetric on the face and arms, no sensation of the lower extremities.  Coordination: The patient has good finger-nose-finger bilaterally. The patient is unable to perform heel-to-shin bilaterally.  Gait and station: The patient is with rebound, she is nonambulatory.  Reflexes: Deep tendon reflexes are symmetric, but are depressed.   Assessment/Plan:  1. Multiple sclerosis  2. Gait  disorder  3. Neurogenic bladder  The patient will remain on Tecfidera, she will have blood work done today. We will consider MRI of the brain and cervical spine on the next revisit. The last study was done in 2015. The patient uses the pharmacy through [facility], telephone number is [phone].  Jill Alexanders MD 06/03/2016 1:46 PM  [facility] [address] [place], [place] [address]  Phone [phone] Fax [phone]

## 2016-06-04 LAB — COMPREHENSIVE METABOLIC PANEL
A/G RATIO: 1.7 (ref 1.2–2.2)
ALBUMIN: 3.9 g/dL (ref 3.5–4.8)
ALT: 7 IU/L (ref 0–32)
AST: 9 IU/L (ref 0–40)
Alkaline Phosphatase: 84 IU/L (ref 39–117)
BUN/Creatinine Ratio: 25 (ref 12–28)
BUN: 19 mg/dL (ref 8–27)
Bilirubin Total: 0.3 mg/dL (ref 0.0–1.2)
CALCIUM: 9.6 mg/dL (ref 8.7–10.3)
CO2: 29 mmol/L (ref 18–29)
Chloride: 98 mmol/L (ref 96–106)
Creatinine, Ser: 0.75 mg/dL (ref 0.57–1.00)
GFR, EST AFRICAN AMERICAN: 91 mL/min/{1.73_m2} (ref >59)
GFR, EST NON AFRICAN AMERICAN: 79 mL/min/{1.73_m2} (ref >59)
GLOBULIN, TOTAL: 2.3 g/dL (ref 1.5–4.5)
Glucose: 288 mg/dL — ABNORMAL HIGH (ref 65–99)
POTASSIUM: 5.2 mmol/L (ref 3.5–5.2)
SODIUM: 141 mmol/L (ref 134–144)
TOTAL PROTEIN: 6.2 g/dL (ref 6.0–8.5)

## 2016-06-04 LAB — CBC WITH DIFFERENTIAL/PLATELET
BASOS: 0 %
Basophils Absolute: 0 10*3/uL (ref 0.0–0.2)
EOS (ABSOLUTE): 0 10*3/uL (ref 0.0–0.4)
Eos: 0 %
HEMATOCRIT: 36.1 % (ref 34.0–46.6)
Hemoglobin: 11.4 g/dL (ref 11.1–15.9)
IMMATURE GRANS (ABS): 0.1 10*3/uL (ref 0.0–0.1)
IMMATURE GRANULOCYTES: 1 %
Lymphocytes Absolute: 0.9 10*3/uL (ref 0.7–3.1)
Lymphs: 6 %
MCH: 28.1 pg (ref 26.6–33.0)
MCHC: 31.6 g/dL (ref 31.5–35.7)
MCV: 89 fL (ref 79–97)
MONOS ABS: 0.4 10*3/uL (ref 0.1–0.9)
Monocytes: 3 %
NEUTROS ABS: 13 10*3/uL — AB (ref 1.4–7.0)
Neutrophils: 90 %
Platelets: 333 10*3/uL (ref 150–379)
RBC: 4.05 x10E6/uL (ref 3.77–5.28)
RDW: 15.4 % (ref 12.3–15.4)
WBC: 14.5 10*3/uL — ABNORMAL HIGH (ref 3.4–10.8)

## 2016-06-06 ENCOUNTER — Telehealth: Payer: Self-pay

## 2016-06-06 NOTE — Telephone Encounter (Signed)
Called pt w/ lab results. Verbalized understanding and appreciation for call. 

## 2016-06-06 NOTE — Telephone Encounter (Signed)
-----   Message from Kathrynn Ducking, MD sent at 06/04/2016  7:51 AM EST -----  The blood work results are unremarkable, with exception of elevated glucose level and elevated white blood count, the patient is on oral prednisone, white blood count elevation likely secondary to this. Absolute lymphocyte count is normal. Please call the patient. ----- Message ----- From: Interface, [facility] Lab Results In Sent: 06/04/2016   7:43 AM To: Kathrynn Ducking, MD

## 2016-08-28 ENCOUNTER — Other Ambulatory Visit: Payer: Self-pay | Admitting: Nurse Practitioner

## 2016-08-28 ENCOUNTER — Ambulatory Visit
Admission: RE | Admit: 2016-08-28 | Discharge: 2016-08-28 | Disposition: A | Discharge status: Home / Self Care | Payer: Medicare (Managed Care) | Source: Ambulatory Visit | Attending: Nurse Practitioner | Admitting: Nurse Practitioner

## 2016-08-28 ENCOUNTER — Emergency Department (HOSPITAL_COMMUNITY)
Admission: EM | Admit: 2016-08-28 | Discharge: 2016-08-29 | Disposition: A | Discharge status: Home / Self Care | Payer: Medicare (Managed Care) | Attending: Emergency Medicine | Admitting: Emergency Medicine

## 2016-08-28 ENCOUNTER — Encounter (HOSPITAL_COMMUNITY): Payer: Self-pay | Admitting: *Deleted

## 2016-08-28 DIAGNOSIS — W19XXXA Unspecified fall, initial encounter: Secondary | ICD-10-CM

## 2016-08-28 DIAGNOSIS — S82155A Nondisplaced fracture of left tibial tuberosity, initial encounter for closed fracture: Secondary | ICD-10-CM | POA: Insufficient documentation

## 2016-08-28 DIAGNOSIS — S82831A Other fracture of upper and lower end of right fibula, initial encounter for closed fracture: Secondary | ICD-10-CM | POA: Insufficient documentation

## 2016-08-28 DIAGNOSIS — Z96653 Presence of artificial knee joint, bilateral: Secondary | ICD-10-CM | POA: Insufficient documentation

## 2016-08-28 DIAGNOSIS — S82191A Other fracture of upper end of right tibia, initial encounter for closed fracture: Secondary | ICD-10-CM | POA: Diagnosis not present

## 2016-08-28 DIAGNOSIS — Y9389 Activity, other specified: Secondary | ICD-10-CM | POA: Insufficient documentation

## 2016-08-28 DIAGNOSIS — Z79899 Other long term (current) drug therapy: Secondary | ICD-10-CM | POA: Diagnosis not present

## 2016-08-28 DIAGNOSIS — I10 Essential (primary) hypertension: Secondary | ICD-10-CM | POA: Diagnosis not present

## 2016-08-28 DIAGNOSIS — Y999 Unspecified external cause status: Secondary | ICD-10-CM | POA: Diagnosis not present

## 2016-08-28 DIAGNOSIS — S82101A Unspecified fracture of upper end of right tibia, initial encounter for closed fracture: Secondary | ICD-10-CM

## 2016-08-28 DIAGNOSIS — W1839XA Other fall on same level, initial encounter: Secondary | ICD-10-CM | POA: Insufficient documentation

## 2016-08-28 DIAGNOSIS — E119 Type 2 diabetes mellitus without complications: Secondary | ICD-10-CM | POA: Insufficient documentation

## 2016-08-28 DIAGNOSIS — Z7984 Long term (current) use of oral hypoglycemic drugs: Secondary | ICD-10-CM | POA: Insufficient documentation

## 2016-08-28 DIAGNOSIS — Y92009 Unspecified place in unspecified non-institutional (private) residence as the place of occurrence of the external cause: Secondary | ICD-10-CM | POA: Diagnosis not present

## 2016-08-28 DIAGNOSIS — S8992XA Unspecified injury of left lower leg, initial encounter: Secondary | ICD-10-CM | POA: Diagnosis present

## 2016-08-28 MED ORDER — DULOXETINE HCL 60 MG PO CPEP: 90.0000 mg | ORAL_CAPSULE | Freq: Every day | ORAL | Status: DC

## 2016-08-28 MED ORDER — PANTOPRAZOLE SODIUM 40 MG PO TBEC: 40.0000 mg | DELAYED_RELEASE_TABLET | Freq: Every day | ORAL | Status: DC

## 2016-08-28 MED ORDER — DIMETHYL FUMARATE 240 MG PO CPDR: 240.0000 mg | DELAYED_RELEASE_CAPSULE | Freq: Two times a day (BID) | ORAL | Status: DC

## 2016-08-28 MED ORDER — IRBESARTAN 300 MG PO TABS
300.0000 mg | ORAL_TABLET | Freq: Every day | ORAL | Status: DC
  Administered 2016-08-28: 300 mg via ORAL
  Filled 2016-08-28 (×2): qty 1

## 2016-08-28 MED ORDER — GLIMEPIRIDE 2 MG PO TABS
2.0000 mg | ORAL_TABLET | Freq: Every day | ORAL | Status: DC
  Filled 2016-08-28 (×2): qty 1

## 2016-08-28 MED ORDER — AMLODIPINE BESYLATE 5 MG PO TABS
10.0000 mg | ORAL_TABLET | Freq: Every day | ORAL | Status: DC
  Filled 2016-08-28: qty 2

## 2016-08-28 MED ORDER — BACLOFEN 20 MG PO TABS
20.0000 mg | ORAL_TABLET | Freq: Once | ORAL | Status: AC
  Administered 2016-08-28: 20 mg via ORAL
  Filled 2016-08-28: qty 1

## 2016-08-28 MED ORDER — PREDNISONE 5 MG PO TABS: 5.0000 mg | ORAL_TABLET | Freq: Once | ORAL | Status: DC

## 2016-08-28 MED ORDER — GABAPENTIN 300 MG PO CAPS
300.0000 mg | ORAL_CAPSULE | Freq: Three times a day (TID) | ORAL | Status: DC
  Administered 2016-08-28: 300 mg via ORAL
  Filled 2016-08-28: qty 1

## 2016-08-28 NOTE — ED Notes (Signed)
Patient's nurse from [facility] spoke with patient and called this nurse.  [facility] nurse advised patient to stay at least for one night in the hospital and patient agreed.  EDP made aware and is going to have patient admitted.

## 2016-08-28 NOTE — Progress Notes (Addendum)
CSW received call for consult from [facility] ED about pt needing transportation home at D/C on 08/28/16.  CSW contacted [facility], pt's provider who reports their transportation resources are only available during day time hours.  CSW spoke with EDP in which pt's plan foir D/C was discussed.  CSW was informed pt needed transportation.  CSW spoke to pt's son Jannie Larrick at ph: [phone] and ph: [phone] who lives in [place] who reported pt has a "plan" that the son is agreeable to, in which pt's Home Health aide will be at her home to receive the pt when pt D/C's. Pt's son reports Home Health aide will assist the pt in her home "for a few hours".  Pt's son then reported [facility] aides would then be in the home again on the morning of 2/29, to further assist the pt with needed services. Pt's son stated he would not be able to assist the pt due to the pt having a motorized wheelchair. CSW stated to pt's son he would call pt's son back with updates on transportation.     CSW received a call back from pt's on-call nurse at [facility], Jaeci at ph: [phone] who stated pt will have transportation available to her from [facility] when they open at [place] spoke to pt's son Inisha Aquirre [phone] who stated he could not pick up pt due to pt's MS and her weight, as well as an inability to transport her motorized wheelchair.  Pt's son Leane Para lives in [place], [place]. Pt's son Leane Para is also agreeable to the plan that the Home Health aide will assist the pt until she sleeps and then the pt will have another aide arrive in the morning to continue assisting her.  CSW is seeking authorization for [employer] from [place].  10:34 PM CSW spoke with EDP.  Pt will be in observation overnight.  CSW will update family.  10:44 PM CSW called Annalee Genta, pt's on-call nurse at [facility] at ph: [phone] who stated [facility] ED can call [phone] in the morning to  arrange transportation for the pt to the pt's home, as well as transporting her motorized wheelchair.  Kymorah RN stated she will update staff at [facility] at their 8 am meeting and transportion will be available for the pt at that time.  Pt's plan is to have a Home health aide at her home to assist her in the morning on 2/29 once the pt is D/C'd.  CSW updated pt's sons.  CSW signing off.      Alphonse Guild. Sussan Meter, Latanya Presser, LCAS Clinical Social Worker Ph: [phone]

## 2016-08-28 NOTE — ED Notes (Signed)
Ortho tech contacted to place knee immobilizers on both right and left knees.

## 2016-08-28 NOTE — ED Triage Notes (Signed)
Pt with R LE pain after her aide dropped her legs buckled under her.  [facility] went to [facility] today and they ordered and X-ray which showed a fracture.

## 2016-08-28 NOTE — ED Provider Notes (Signed)
[facility] DEPT Provider Note   CSN: [id number] Arrival date & time: 08/28/16  1613     History   Chief Complaint Chief Complaint  Patient presents with  . Fall  . Leg Injury    HPI Laura Roberts is a 76 y.o. female. Patient is a very pleasant female with history of multiple sclerosis who presents after she had a fall at home. She was trying to transfer with assistance of a caregiver when she fell onto bilateral knees. She went to outpatient clinic where x-rays were ordered and revealed a right tib-fib/fib fracture and a left tibial avulsion fracture. She was sent here for further evaluation. Patient denies any pain at this time. She reports that she no longer is able to bear any weight on either leg related to her Ms. She denies any other injuries or LOC when she fell today.  HPI  Past Medical History:  Diagnosis Date  . Degenerative arthritis   . Diabetes mellitus without complication ([place])   . Gait disturbance   . Hypertension   . Multiple sclerosis ([place])   . Neuromuscular disorder (HCC)    Bilateral hand carpal tunnel syndrome  . Obesity   . Spinal stenosis in cervical region   . Spinal stenosis of lumbosacral region   . Spinal stenosis, thoracic     Patient Active Problem List   Diagnosis Date Noted  . Paraplegia ([place]) 06/01/2014  . Closed supracondylar fracture of right femur, periprosthetic 06/01/2014  . Femoral fracture ([place]) 06/01/2014  . Encounter for therapeutic drug monitoring 01/05/2014  . Foot drop, bilateral 10/13/2012  . Abnormality of gait 10/13/2012  . Neurogenic bladder 09/02/2012  . Neurogenic pain of lower extremity 09/02/2012  . SOB (shortness of breath) 07/21/2012  . Weakness 07/21/2012  . Bronchitis 07/21/2012  . Multiple sclerosis exacerbation ([place]) 07/21/2012  . Cough 07/21/2012  . HTN (hypertension) 07/21/2012  . DM (diabetes mellitus) ([place]) 07/21/2012  . HLD (hyperlipidemia) 07/21/2012  . Tachycardia 07/21/2012  . GERD  (gastroesophageal reflux disease) 07/21/2012  . Multiple sclerosis ([place])     Past Surgical History:  Procedure Laterality Date  . ABDOMINAL HYSTERECTOMY    . BACK SURGERY    . CATARACT EXTRACTION Right   . CHOLECYSTECTOMY    . FEMUR IM NAIL Right 06/02/2014   Procedure: INTRAMEDULLARY (IM) RETROGRADE FEMORAL NAILING;  Surgeon: Johnny Bridge, MD;  Location: [facility];  Service: Orthopedics;  Laterality: Right;  . left hand carpal tunnel surgery    . REPLACEMENT TOTAL KNEE BILATERAL  2004    OB History    No data available       Home Medications    Prior to Admission medications   Medication Sig Start Date End Date Taking? Authorizing Provider  acetaminophen (TYLENOL) 325 MG tablet Take 325 mg by mouth 2 (two) times daily.    Yes Historical Provider, MD  amLODipine (NORVASC) 5 MG tablet Take 10 mg by mouth at bedtime.    Yes Historical Provider, MD  ARTIFICIAL TEAR OP Apply 1 drop to eye 4 (four) times daily as needed (dry eyes).    Yes Historical Provider, MD  baclofen (LIORESAL) 10 MG tablet 1.5 tablet in the morning, 1 tablet midday, two tablets at night 11/29/15  Yes Ward Givens, NP  calcium citrate (CALCITRATE - DOSED IN MG ELEMENTAL CALCIUM) 950 MG tablet Take 200 mg of elemental calcium by mouth daily.   Yes Historical Provider, MD  cholecalciferol (VITAMIN D) 1000 UNITS tablet Take 1,000 Units by  mouth daily.   Yes Historical Provider, MD  Dimethyl Fumarate (TECFIDERA) 240 MG CPDR Take 1 capsule (240 mg total) by mouth 2 (two) times daily. 03/13/16  Yes Kathrynn Ducking, MD  DULoxetine (CYMBALTA) 30 MG capsule Take 90 mg by mouth daily. Take with 60mg  for a total of 90mg    Yes Historical Provider, MD  DULoxetine (CYMBALTA) 60 MG capsule Take 90 mg by mouth daily. Take along with a 30mg    Yes Historical Provider, MD  gabapentin (NEURONTIN) 300 MG capsule Take 300 mg by mouth 3 (three) times daily.   Yes Historical Provider, MD  glimepiride (AMARYL) 2 MG tablet Take 2 mg by  mouth daily.    Yes Historical Provider, MD  lidocaine (LIDODERM) 5 % Place 1 patch onto the skin daily as needed (pain). Remove & Discard patch within 12 hours or as directed by MD    Yes Historical Provider, MD  omeprazole (PRILOSEC) 20 MG capsule Take 20 mg by mouth daily.   Yes Historical Provider, MD  Petrolatum-Zinc Oxide (SENSI-CARE PROTECTIVE BARRIER EX) Apply 1 application topically daily as needed (skin).    Yes Historical Provider, MD  polyethylene glycol (MIRALAX / GLYCOLAX) packet Take 17 g by mouth daily as needed for mild constipation. Patient taking differently: Take 17 g by mouth every other day.  06/06/14  Yes Tasrif Ahmed, MD  predniSONE (DELTASONE) 5 MG tablet 1 tablet on odd days, one half tablet on even days 11/22/14  Yes Kathrynn Ducking, MD  valsartan (DIOVAN) 320 MG tablet Take 320 mg by mouth at bedtime.    Yes Historical Provider, MD    Family History Family History  Problem Relation Age of Onset  . Multiple sclerosis Other     neices.   . Cancer Mother   . Diabetes Mother   . GI Bleed Sister     diverticulitis    Social History Social History  Substance Use Topics  . Smoking status: Never Smoker  . Smokeless tobacco: Never Used  . Alcohol use No     Allergies   Codeine; Ultram [tramadol]; and Januvia [sitagliptin]   Review of Systems Review of Systems  Constitutional: Negative for chills and fever.  HENT: Negative for ear pain and sore throat.   Eyes: Negative for pain and visual disturbance.  Respiratory: Negative for cough and shortness of breath.   Cardiovascular: Negative for chest pain and palpitations.  Gastrointestinal: Negative for abdominal pain and vomiting.  Genitourinary: Negative for dysuria and hematuria.  Musculoskeletal: Positive for joint swelling. Negative for arthralgias and back pain.  Skin: Negative for color change and rash.  Neurological: Positive for weakness (chronic leg weakness). Negative for seizures and syncope.  All  other systems reviewed and are negative.    Physical Exam Updated Vital Signs BP 108/55   Pulse 118   Temp 98.1 F (36.7 C) (Oral)   Resp 16   Ht 5\' 2"  (1.575 m)   Wt 80.7 kg   SpO2 99%   BMI 32.56 kg/m   Physical Exam  Constitutional: She is oriented to person, place, and time. She appears well-developed and well-nourished. No distress.  HENT:  Head: Normocephalic and atraumatic.  Eyes: Conjunctivae are normal.  Neck: Neck supple.  Cardiovascular: Normal rate and regular rhythm.   No murmur heard. Pulmonary/Chest: Effort normal and breath sounds normal. No respiratory distress.  Abdominal: Soft. She exhibits no distension and no mass. There is no tenderness. There is no guarding.  Musculoskeletal: She exhibits edema,  tenderness and deformity.  Swelling and tenderness over right knee and proximal tib/fib on rihgt. Mild swelling overlying left proximal tibia with mild tenderness. Distal pulses are intact in bilateral lower extremities. Sensation to light touch and movement are absent in lower extremities which is chronic per patient.  Neurological: She is alert and oriented to person, place, and time.  Skin: Skin is warm and dry.  Psychiatric: She has a normal mood and affect.  Nursing note and vitals reviewed.    ED Treatments / Results  Labs (all labs ordered are listed, but only abnormal results are displayed) Labs Reviewed - No data to display  EKG  EKG Interpretation None       Radiology Dg Tibia/fibula Right  Result Date: 08/28/2016 CLINICAL DATA:  Fall. EXAM: RIGHT TIBIA AND FIBULA - 2 VIEW COMPARISON:  None. FINDINGS: Status post right total knee arthroplasty. Moderately displaced oblique fractures are seen involving the proximal right tibia and fibula. Vascular calcifications are noted. IMPRESSION: Moderately displaced proximal right tibial and fibular fractures. Electronically Signed   By: Marijo Conception, M.D.   On: 08/28/2016 15:42   Dg Knee Complete 4  Views Left  Result Date: 08/28/2016 CLINICAL DATA:  Fall. EXAM: LEFT KNEE - COMPLETE 4+ VIEW FINDINGS: Status post left total knee arthroplasty. The femoral and tibial components appear to be well situated. Vascular calcifications are noted. There appears to be a moderately displaced avulsion fracture involving the tibial tuberosity. IMPRESSION: Moderately displaced avulsion fracture involving the tibial tuberosity. Electronically Signed   By: Marijo Conception, M.D.   On: 08/28/2016 15:39   Dg Knee Complete 4 Views Right  Result Date: 08/28/2016 CLINICAL DATA:  Fall. EXAM: RIGHT KNEE - COMPLETE 4+ VIEW COMPARISON:  None. FINDINGS: Status post right total knee arthroplasty, as well as intramedullary rod fixation of right femur. Moderately displaced oblique fractures are seen involving the proximal tibia and fibula. IMPRESSION: Moderately displaced proximal right tibial and fibular fractures. Status post right total knee arthroplasty. Electronically Signed   By: Marijo Conception, M.D.   On: 08/28/2016 15:41   Dg Foot Complete Right  Result Date: 08/28/2016 CLINICAL DATA:  Fall. EXAM: RIGHT FOOT COMPLETE - 3+ VIEW COMPARISON:  None. FINDINGS: There is no evidence of fracture or dislocation. There is no evidence of arthropathy or other focal bone abnormality. Soft tissues are unremarkable. IMPRESSION: No acute abnormality seen in the right foot. Electronically Signed   By: Marijo Conception, M.D.   On: 08/28/2016 15:46   Dg Hips Bilat With Pelvis 3-4 Views  Result Date: 08/28/2016 CLINICAL DATA:  Fall. EXAM: DG HIP (WITH OR WITHOUT PELVIS) 3-4V BILAT COMPARISON:  None. FINDINGS: There is no evidence of hip fracture or dislocation. There is no evidence of arthropathy or other focal bone abnormality. IMPRESSION: No significant abnormality seen involving either hip. Electronically Signed   By: Marijo Conception, M.D.   On: 08/28/2016 15:37    Procedures Procedures (including critical care time)  Medications  Ordered in ED Medications  amLODipine (NORVASC) tablet 10 mg (not administered)  baclofen (LIORESAL) tablet 20 mg (not administered)  DULoxetine (CYMBALTA) DR capsule 90 mg (not administered)  gabapentin (NEURONTIN) capsule 300 mg (not administered)  glimepiride (AMARYL) tablet 2 mg (not administered)  pantoprazole (PROTONIX) EC tablet 40 mg (not administered)  predniSONE (DELTASONE) tablet 5 mg (not administered)  irbesartan (AVAPRO) tablet 300 mg (not administered)  Dimethyl Fumarate CPDR 240 mg (not administered)     Initial Impression /  Assessment and Plan / ED Course  I have reviewed the triage vital signs and the nursing notes.  Pertinent labs & imaging results that were available during my care of the patient were reviewed by me and considered in my medical decision making (see chart for details).    Patient is a 36 old female with history as above who presents after a fall at home. She is a full assist and had a healthcare assistant that was helping transfer from her wheelchair to the bed when the assistant externally dropped her. The patient fell onto bilateral knees. She was seen in an outside clinic earlier today where she had x-rays obtained. Her x-rays showed a right periprosthetic tibia and fibula fracture as well as a left tibial tuberosity avulsion fracture. She has mild tenderness to palpation of the right fracture, but overall does not complain of any pain. She chronically has no motor or sensation to light touch in either lower externally. Her pulses are intact distal to both injuries. I spoke with Dr. Fredonia Highland with orthopedics who advised she will likely be nonoperative. He would like her placed in bilateral knee immobilizers and have her follow-up in clinic next week. Pt is here with her motorized wheelchair. I have talked to social work but we will be unable to arrange transportation for her home until tomorrow morning. She has home health services at home that assist her  in the morning getting into her wheelchair and in the evening getting into her bed. She will now be in b/l knee immobilizers but overall this is not a significant change in her mobility, so discharge home should not be difficult. Pt is amenable to discharge. She wanted to go home this evening, but unable to arrange. Pt will stay in ED until transport is ready for her around 8am. Home meds and diet ordered.  Final Clinical Impressions(s) / ED Diagnoses   Final diagnoses:  Closed fracture of proximal end of right tibia, unspecified fracture morphology, initial encounter  Closed fracture of proximal end of right fibula, unspecified fracture morphology, initial encounter  Closed nondisplaced fracture of left tibial tuberosity, initial encounter    New Prescriptions New Prescriptions   No medications on file     Clifton James, MD [id number]    David Glick, MD [phone]

## 2016-08-28 NOTE — ED Notes (Signed)
Patient transferred from her wheelchair to gurney with 2 person assist.

## 2016-08-28 NOTE — Progress Notes (Signed)
Orthopedic Tech Progress Note Patient Details:  Laura Roberts 1941-02-06 [id number]  Ortho Devices Type of Ortho Device: Knee Immobilizer Ortho Device/Splint Location: (B) LE Ortho Device/Splint Interventions: Ordered, Application   Braulio Bosch 08/28/2016, 9:48 PM

## 2016-08-29 LAB — CBG MONITORING, ED: Glucose-Capillary: 319 mg/dL — ABNORMAL HIGH (ref 65–99)

## 2016-08-29 NOTE — ED Notes (Signed)
Pharmacy contacted for 0800 meds.

## 2016-08-29 NOTE — ED Notes (Addendum)
Pt awake, alerrt and set up to eat breakfast. Denies complaint at present and awaiting transport for her discharge. Pt has bilateral knee immobilizers with bilateral distal pulses.

## 2016-08-29 NOTE — ED Notes (Signed)
Pt states she understands instructins . Home stable after transferred to wc with assist of staff. HGome with [facility].

## 2016-11-06 ENCOUNTER — Encounter (HOSPITAL_BASED_OUTPATIENT_CLINIC_OR_DEPARTMENT_OTHER): Payer: Medicare (Managed Care) | Attending: Surgery

## 2016-11-06 DIAGNOSIS — G8221 Paraplegia, complete: Secondary | ICD-10-CM | POA: Diagnosis not present

## 2016-11-06 DIAGNOSIS — Z7951 Long term (current) use of inhaled steroids: Secondary | ICD-10-CM | POA: Diagnosis not present

## 2016-11-06 DIAGNOSIS — L89613 Pressure ulcer of right heel, stage 3: Secondary | ICD-10-CM | POA: Insufficient documentation

## 2016-11-06 DIAGNOSIS — Z96653 Presence of artificial knee joint, bilateral: Secondary | ICD-10-CM | POA: Diagnosis not present

## 2016-11-06 DIAGNOSIS — Z79899 Other long term (current) drug therapy: Secondary | ICD-10-CM | POA: Insufficient documentation

## 2016-11-06 DIAGNOSIS — L89314 Pressure ulcer of right buttock, stage 4: Secondary | ICD-10-CM | POA: Insufficient documentation

## 2016-11-06 DIAGNOSIS — I10 Essential (primary) hypertension: Secondary | ICD-10-CM | POA: Insufficient documentation

## 2016-11-06 DIAGNOSIS — G35 Multiple sclerosis: Secondary | ICD-10-CM | POA: Insufficient documentation

## 2016-11-06 DIAGNOSIS — E11622 Type 2 diabetes mellitus with other skin ulcer: Secondary | ICD-10-CM | POA: Diagnosis not present

## 2016-11-06 DIAGNOSIS — L97312 Non-pressure chronic ulcer of right ankle with fat layer exposed: Secondary | ICD-10-CM | POA: Diagnosis not present

## 2016-11-12 DIAGNOSIS — T148XXA Other injury of unspecified body region, initial encounter: Secondary | ICD-10-CM | POA: Diagnosis not present

## 2016-11-13 ENCOUNTER — Ambulatory Visit
Admission: RE | Admit: 2016-11-13 | Discharge: 2016-11-13 | Disposition: A | Discharge status: Home / Self Care | Payer: Medicare (Managed Care) | Source: Ambulatory Visit | Attending: Nurse Practitioner | Admitting: Nurse Practitioner

## 2016-11-13 ENCOUNTER — Other Ambulatory Visit: Payer: Self-pay | Admitting: Nurse Practitioner

## 2016-11-13 DIAGNOSIS — L8994 Pressure ulcer of unspecified site, stage 4: Secondary | ICD-10-CM

## 2016-11-22 ENCOUNTER — Emergency Department (HOSPITAL_COMMUNITY): Payer: Medicare (Managed Care)

## 2016-11-22 ENCOUNTER — Encounter (HOSPITAL_COMMUNITY): Payer: Self-pay | Admitting: *Deleted

## 2016-11-22 ENCOUNTER — Inpatient Hospital Stay (HOSPITAL_COMMUNITY)
Admission: EM | Admit: 2016-11-22 | Discharge: 2016-11-26 | DRG: 871 | Disposition: A | Discharge status: Skilled Nursing Facility | Payer: Medicare (Managed Care) | Attending: Internal Medicine | Admitting: Internal Medicine

## 2016-11-22 DIAGNOSIS — Z79899 Other long term (current) drug therapy: Secondary | ICD-10-CM | POA: Diagnosis not present

## 2016-11-22 DIAGNOSIS — Z8269 Family history of other diseases of the musculoskeletal system and connective tissue: Secondary | ICD-10-CM

## 2016-11-22 DIAGNOSIS — Z885 Allergy status to narcotic agent status: Secondary | ICD-10-CM

## 2016-11-22 DIAGNOSIS — G8220 Paraplegia, unspecified: Secondary | ICD-10-CM | POA: Diagnosis present

## 2016-11-22 DIAGNOSIS — Z888 Allergy status to other drugs, medicaments and biological substances status: Secondary | ICD-10-CM | POA: Diagnosis not present

## 2016-11-22 DIAGNOSIS — R000 Tachycardia, unspecified: Secondary | ICD-10-CM | POA: Diagnosis present

## 2016-11-22 DIAGNOSIS — A419 Sepsis, unspecified organism: Secondary | ICD-10-CM | POA: Diagnosis present

## 2016-11-22 DIAGNOSIS — I10 Essential (primary) hypertension: Secondary | ICD-10-CM | POA: Diagnosis present

## 2016-11-22 DIAGNOSIS — D72829 Elevated white blood cell count, unspecified: Secondary | ICD-10-CM | POA: Diagnosis not present

## 2016-11-22 DIAGNOSIS — K592 Neurogenic bowel, not elsewhere classified: Secondary | ICD-10-CM | POA: Diagnosis present

## 2016-11-22 DIAGNOSIS — Z6832 Body mass index (BMI) 32.0-32.9, adult: Secondary | ICD-10-CM | POA: Diagnosis not present

## 2016-11-22 DIAGNOSIS — F329 Major depressive disorder, single episode, unspecified: Secondary | ICD-10-CM | POA: Diagnosis present

## 2016-11-22 DIAGNOSIS — L089 Local infection of the skin and subcutaneous tissue, unspecified: Secondary | ICD-10-CM

## 2016-11-22 DIAGNOSIS — E46 Unspecified protein-calorie malnutrition: Secondary | ICD-10-CM | POA: Diagnosis present

## 2016-11-22 DIAGNOSIS — E876 Hypokalemia: Secondary | ICD-10-CM | POA: Diagnosis present

## 2016-11-22 DIAGNOSIS — D509 Iron deficiency anemia, unspecified: Secondary | ICD-10-CM | POA: Diagnosis present

## 2016-11-22 DIAGNOSIS — E119 Type 2 diabetes mellitus without complications: Secondary | ICD-10-CM | POA: Diagnosis present

## 2016-11-22 DIAGNOSIS — E669 Obesity, unspecified: Secondary | ICD-10-CM

## 2016-11-22 DIAGNOSIS — L89314 Pressure ulcer of right buttock, stage 4: Secondary | ICD-10-CM | POA: Diagnosis present

## 2016-11-22 DIAGNOSIS — Z96653 Presence of artificial knee joint, bilateral: Secondary | ICD-10-CM | POA: Diagnosis present

## 2016-11-22 DIAGNOSIS — Z66 Do not resuscitate: Secondary | ICD-10-CM | POA: Diagnosis present

## 2016-11-22 DIAGNOSIS — G35 Multiple sclerosis: Secondary | ICD-10-CM | POA: Diagnosis present

## 2016-11-22 DIAGNOSIS — L89613 Pressure ulcer of right heel, stage 3: Secondary | ICD-10-CM | POA: Diagnosis present

## 2016-11-22 DIAGNOSIS — N319 Neuromuscular dysfunction of bladder, unspecified: Secondary | ICD-10-CM | POA: Diagnosis present

## 2016-11-22 DIAGNOSIS — Z7952 Long term (current) use of systemic steroids: Secondary | ICD-10-CM | POA: Diagnosis not present

## 2016-11-22 DIAGNOSIS — T148XXA Other injury of unspecified body region, initial encounter: Secondary | ICD-10-CM

## 2016-11-22 DIAGNOSIS — Z993 Dependence on wheelchair: Secondary | ICD-10-CM | POA: Diagnosis not present

## 2016-11-22 DIAGNOSIS — L89154 Pressure ulcer of sacral region, stage 4: Secondary | ICD-10-CM

## 2016-11-22 DIAGNOSIS — Z8249 Family history of ischemic heart disease and other diseases of the circulatory system: Secondary | ICD-10-CM | POA: Diagnosis not present

## 2016-11-22 DIAGNOSIS — Z794 Long term (current) use of insulin: Secondary | ICD-10-CM | POA: Diagnosis not present

## 2016-11-22 DIAGNOSIS — R531 Weakness: Secondary | ICD-10-CM | POA: Diagnosis not present

## 2016-11-22 DIAGNOSIS — I959 Hypotension, unspecified: Secondary | ICD-10-CM | POA: Diagnosis not present

## 2016-11-22 DIAGNOSIS — R404 Transient alteration of awareness: Secondary | ICD-10-CM | POA: Diagnosis not present

## 2016-11-22 DIAGNOSIS — Z820 Family history of epilepsy and other diseases of the nervous system: Secondary | ICD-10-CM

## 2016-11-22 DIAGNOSIS — L89304 Pressure ulcer of unspecified buttock, stage 4: Secondary | ICD-10-CM | POA: Diagnosis present

## 2016-11-22 LAB — URINALYSIS, ROUTINE W REFLEX MICROSCOPIC
Bilirubin Urine: NEGATIVE
Glucose, UA: NEGATIVE mg/dL
KETONES UR: 15 mg/dL — AB
LEUKOCYTES UA: NEGATIVE
Nitrite: NEGATIVE
PROTEIN: NEGATIVE mg/dL
Specific Gravity, Urine: <1.005 — ABNORMAL LOW (ref 1.005–1.030)
pH: 5.5 (ref 5.0–8.0)

## 2016-11-22 LAB — CBC WITH DIFFERENTIAL/PLATELET
BASOS ABS: 0 10*3/uL (ref 0.0–0.1)
BASOS PCT: 0 %
Eosinophils Absolute: 0.1 10*3/uL (ref 0.0–0.7)
Eosinophils Relative: 0 %
HEMATOCRIT: 26.6 % — AB (ref 36.0–46.0)
HEMOGLOBIN: 8.5 g/dL — AB (ref 12.0–15.0)
Lymphocytes Relative: 8 %
Lymphs Abs: 1.5 10*3/uL (ref 0.7–4.0)
MCH: 24.5 pg — ABNORMAL LOW (ref 26.0–34.0)
MCHC: 32 g/dL (ref 30.0–36.0)
MCV: 76.7 fL — ABNORMAL LOW (ref 78.0–100.0)
Monocytes Absolute: 1 10*3/uL (ref 0.1–1.0)
Monocytes Relative: 6 %
NEUTROS ABS: 15.7 10*3/uL — AB (ref 1.7–7.7)
NEUTROS PCT: 86 %
Platelets: 562 10*3/uL — ABNORMAL HIGH (ref 150–400)
RBC: 3.47 MIL/uL — AB (ref 3.87–5.11)
RDW: 17.5 % — ABNORMAL HIGH (ref 11.5–15.5)
WBC: 18.2 10*3/uL — AB (ref 4.0–10.5)

## 2016-11-22 LAB — COMPREHENSIVE METABOLIC PANEL
ALK PHOS: 98 U/L (ref 38–126)
ALT: 24 U/L (ref 14–54)
ANION GAP: 17 — AB (ref 5–15)
AST: 26 U/L (ref 15–41)
Albumin: 2.1 g/dL — ABNORMAL LOW (ref 3.5–5.0)
BILIRUBIN TOTAL: 1.8 mg/dL — AB (ref 0.3–1.2)
BUN: 9 mg/dL (ref 6–20)
CALCIUM: 8.2 mg/dL — AB (ref 8.9–10.3)
CO2: 27 mmol/L (ref 22–32)
Chloride: 89 mmol/L — ABNORMAL LOW (ref 101–111)
Creatinine, Ser: 0.87 mg/dL (ref 0.44–1.00)
Glucose, Bld: 276 mg/dL — ABNORMAL HIGH (ref 65–99)
Potassium: 2.4 mmol/L — CL (ref 3.5–5.1)
SODIUM: 133 mmol/L — AB (ref 135–145)
TOTAL PROTEIN: 5.9 g/dL — AB (ref 6.5–8.1)

## 2016-11-22 LAB — FERRITIN: Ferritin: 509 ng/mL — ABNORMAL HIGH (ref 11–307)

## 2016-11-22 LAB — I-STAT TROPONIN, ED: TROPONIN I, POC: 0 ng/mL (ref 0.00–0.08)

## 2016-11-22 LAB — RETICULOCYTES
RBC.: 3.56 MIL/uL — ABNORMAL LOW (ref 3.87–5.11)
RETIC COUNT ABSOLUTE: 67.6 10*3/uL (ref 19.0–186.0)
Retic Ct Pct: 1.9 % (ref 0.4–3.1)

## 2016-11-22 LAB — URINALYSIS, MICROSCOPIC (REFLEX)

## 2016-11-22 LAB — PROTIME-INR
INR: 1.08
Prothrombin Time: 14 seconds (ref 11.4–15.2)

## 2016-11-22 LAB — I-STAT CG4 LACTIC ACID, ED: LACTIC ACID, VENOUS: 1.32 mmol/L (ref 0.5–1.9)

## 2016-11-22 MED ORDER — SODIUM CHLORIDE 0.9 % IV SOLN
Freq: Once | INTRAVENOUS | Status: AC
  Administered 2016-11-22: 17:00:00 via INTRAVENOUS

## 2016-11-22 MED ORDER — PIPERACILLIN-TAZOBACTAM 3.375 G IVPB 30 MIN
3.3750 g | Freq: Once | INTRAVENOUS | Status: AC
  Administered 2016-11-22: 3.375 g via INTRAVENOUS
  Filled 2016-11-22: qty 50

## 2016-11-22 MED ORDER — ACETAMINOPHEN 650 MG RE SUPP: 650.0000 mg | Freq: Four times a day (QID) | RECTAL | Status: DC | PRN

## 2016-11-22 MED ORDER — VANCOMYCIN HCL IN DEXTROSE 1-5 GM/200ML-% IV SOLN
1000.0000 mg | Freq: Once | INTRAVENOUS | Status: AC
  Administered 2016-11-22: 1000 mg via INTRAVENOUS
  Filled 2016-11-22: qty 200

## 2016-11-22 MED ORDER — POTASSIUM CHLORIDE CRYS ER 20 MEQ PO TBCR
40.0000 meq | EXTENDED_RELEASE_TABLET | Freq: Once | ORAL | Status: AC
  Administered 2016-11-22: 40 meq via ORAL
  Filled 2016-11-22: qty 2

## 2016-11-22 MED ORDER — LIDOCAINE 5 % EX PTCH
1.0000 | MEDICATED_PATCH | Freq: Every day | CUTANEOUS | Status: DC | PRN
  Administered 2016-11-24: 1 via TRANSDERMAL
  Filled 2016-11-22: qty 1

## 2016-11-22 MED ORDER — SODIUM CHLORIDE 0.9 % IV BOLUS (SEPSIS)
500.0000 mL | Freq: Once | INTRAVENOUS | Status: AC
  Administered 2016-11-22: 500 mL via INTRAVENOUS

## 2016-11-22 MED ORDER — SODIUM CHLORIDE 0.9 % IV BOLUS (SEPSIS)
2000.0000 mL | Freq: Once | INTRAVENOUS | Status: AC
  Administered 2016-11-22: 2000 mL via INTRAVENOUS

## 2016-11-22 MED ORDER — ENOXAPARIN SODIUM 40 MG/0.4ML ~~LOC~~ SOLN
40.0000 mg | SUBCUTANEOUS | Status: DC
  Administered 2016-11-22 – 2016-11-25 (×4): 40 mg via SUBCUTANEOUS
  Filled 2016-11-22 (×4): qty 0.4

## 2016-11-22 MED ORDER — INSULIN ASPART 100 UNIT/ML ~~LOC~~ SOLN
0.0000 [IU] | Freq: Three times a day (TID) | SUBCUTANEOUS | Status: DC
  Administered 2016-11-23: 8 [IU] via SUBCUTANEOUS
  Administered 2016-11-23 – 2016-11-24 (×3): 5 [IU] via SUBCUTANEOUS
  Administered 2016-11-24: 2 [IU] via SUBCUTANEOUS
  Administered 2016-11-25: 8 [IU] via SUBCUTANEOUS
  Administered 2016-11-25: 3 [IU] via SUBCUTANEOUS
  Administered 2016-11-26: 8 [IU] via SUBCUTANEOUS
  Administered 2016-11-26 (×2): 3 [IU] via SUBCUTANEOUS

## 2016-11-22 MED ORDER — PANTOPRAZOLE SODIUM 40 MG PO TBEC
40.0000 mg | DELAYED_RELEASE_TABLET | Freq: Every day | ORAL | Status: DC
  Administered 2016-11-22 – 2016-11-26 (×5): 40 mg via ORAL
  Filled 2016-11-22 (×5): qty 1

## 2016-11-22 MED ORDER — DULOXETINE HCL 60 MG PO CPEP
90.0000 mg | ORAL_CAPSULE | Freq: Every day | ORAL | Status: DC
  Administered 2016-11-22 – 2016-11-26 (×5): 90 mg via ORAL
  Filled 2016-11-22 (×5): qty 1

## 2016-11-22 MED ORDER — PROMETHAZINE HCL 25 MG PO TABS: 12.5000 mg | ORAL_TABLET | Freq: Four times a day (QID) | ORAL | Status: DC | PRN

## 2016-11-22 MED ORDER — PREDNISONE 5 MG PO TABS
2.5000 mg | ORAL_TABLET | ORAL | Status: DC
  Administered 2016-11-23 – 2016-11-25 (×2): 2.5 mg via ORAL
  Filled 2016-11-22 (×2): qty 1

## 2016-11-22 MED ORDER — ACETAMINOPHEN 325 MG PO TABS
650.0000 mg | ORAL_TABLET | Freq: Four times a day (QID) | ORAL | Status: DC | PRN
  Administered 2016-11-22 – 2016-11-24 (×2): 650 mg via ORAL
  Filled 2016-11-22 (×2): qty 2

## 2016-11-22 MED ORDER — PIPERACILLIN-TAZOBACTAM 3.375 G IVPB
3.3750 g | Freq: Three times a day (TID) | INTRAVENOUS | Status: DC
  Administered 2016-11-22 – 2016-11-26 (×11): 3.375 g via INTRAVENOUS
  Filled 2016-11-22 (×12): qty 50

## 2016-11-22 MED ORDER — BACLOFEN 10 MG PO TABS
10.0000 mg | ORAL_TABLET | Freq: Three times a day (TID) | ORAL | Status: DC | PRN
  Administered 2016-11-22 – 2016-11-26 (×6): 20 mg via ORAL
  Filled 2016-11-22 (×2): qty 2
  Filled 2016-11-22: qty 1
  Filled 2016-11-22 (×5): qty 2

## 2016-11-22 MED ORDER — PREDNISONE 5 MG PO TABS: 5.0000 mg | ORAL_TABLET | ORAL | Status: DC

## 2016-11-22 MED ORDER — POTASSIUM CHLORIDE IN NACL 20-0.9 MEQ/L-% IV SOLN
INTRAVENOUS | Status: AC
  Administered 2016-11-22: 21:00:00 via INTRAVENOUS
  Filled 2016-11-22 (×2): qty 1000

## 2016-11-22 MED ORDER — POTASSIUM CHLORIDE 10 MEQ/100ML IV SOLN
10.0000 meq | Freq: Once | INTRAVENOUS | Status: AC
  Administered 2016-11-22: 10 meq via INTRAVENOUS
  Filled 2016-11-22: qty 100

## 2016-11-22 MED ORDER — POLYETHYLENE GLYCOL 3350 17 G PO PACK: 17.0000 g | PACK | Freq: Every day | ORAL | Status: DC | PRN

## 2016-11-22 MED ORDER — VANCOMYCIN HCL IN DEXTROSE 750-5 MG/150ML-% IV SOLN
750.0000 mg | Freq: Two times a day (BID) | INTRAVENOUS | Status: DC
  Administered 2016-11-23 – 2016-11-26 (×7): 750 mg via INTRAVENOUS
  Filled 2016-11-22 (×8): qty 150

## 2016-11-22 MED ORDER — SODIUM CHLORIDE 0.9% FLUSH
3.0000 mL | Freq: Two times a day (BID) | INTRAVENOUS | Status: DC
  Administered 2016-11-22 – 2016-11-25 (×6): 3 mL via INTRAVENOUS

## 2016-11-22 MED ORDER — ONDANSETRON HCL 4 MG/2ML IJ SOLN
4.0000 mg | Freq: Once | INTRAMUSCULAR | Status: AC
  Administered 2016-11-22: 4 mg via INTRAVENOUS
  Filled 2016-11-22: qty 2

## 2016-11-22 NOTE — H&P (Signed)
Date: 11/22/2016               Patient Name:  Laura Roberts MRN: [id number]  DOB: 1940/11/23 Age / Sex: 76 y.o., female   PCP: [name], MD         Medical Service: Internal Medicine Teaching Service         Attending Physician: Dr. Bartholomew Crews, MD    First Contact: Dr. Inda Castle Pager: [phone]  Second Contact: Dr. Benjamine Mola Pager: [phone]       After Hours (After 5p/  First Contact Pager: [phone]  weekends / holidays): Second Contact Pager: [phone]   Chief Complaint: "I've been feeling the worst I've ever felt this week."  History of Present Illness: Laura Roberts is a 76 y.o. female with history of MS (c/b paraplegia, neurogenic bowel and bladder), DM, and HTN and obesity who presents with one week of nausea, fatigue, weakness, and chills.    Since Monday she been feeling terrible with nausea but no vomiting, chills, and extreme fatigue and generalized weakness.  She has been going to pace daily for the past 2 weeks rather than her normal 3 times per week schedule after identification of a decubitus ulcer. Since Monday she has been taking doxycycline daily. Getting wound packing and dressing changes pace. However at [facility] today she was told she needed to come to the hospital and readmitted.  She uses an IT trainer wheelchair for mobility, has a chronic Foley catheter for neurogenic bladder, and baseline fecal incontinence and she lacks sensation below her mid back. No new weakness, headaches, or visual changes.  Abut 3 months ago she suffered accidental fall to transfer and fracture of her right tibia and fibula. Since that time she has been spending more time in bed rather than a wheelchair compared to prior, and has had more difficulty with repositioning.   Meds:  Current Meds  Medication Sig  . acetaminophen (TYLENOL) 325 MG tablet Take 325 mg by mouth 2 (two) times daily.   Marland Kitchen amLODipine (NORVASC) 5 MG tablet Take 10 mg by mouth at bedtime.   . ARTIFICIAL  TEAR OP Apply 1 drop to eye 4 (four) times daily as needed (dry eyes).   . baclofen (LIORESAL) 10 MG tablet 1.5 tablet in the morning, 1 tablet midday, two tablets at night  . calcium citrate (CALCITRATE - DOSED IN MG ELEMENTAL CALCIUM) 950 MG tablet Take 200 mg of elemental calcium by mouth daily.  . cholecalciferol (VITAMIN D) 1000 UNITS tablet Take 1,000 Units by mouth daily.  . Dimethyl Fumarate (TECFIDERA) 240 MG CPDR Take 1 capsule (240 mg total) by mouth 2 (two) times daily.  Marland Kitchen doxycycline (VIBRA-TABS) 100 MG tablet Take 100 mg by mouth 2 (two) times daily. Started on 11-18-16 for 14 days.  . DULoxetine (CYMBALTA) 60 MG capsule Take 90 mg by mouth daily. Take along with a 30mg   . glimepiride (AMARYL) 2 MG tablet Take 2 mg by mouth daily.   . insulin glargine (LANTUS) 100 UNIT/ML injection Inject 10 Units into the skin at bedtime.  . lidocaine (LIDODERM) 5 % Place 1 patch onto the skin daily as needed (pain). Remove & Discard patch within 12 hours or as directed by MD   . omeprazole (PRILOSEC) 20 MG capsule Take 20 mg by mouth daily.  Marland Kitchen Petrolatum-Zinc Oxide (SENSI-CARE PROTECTIVE BARRIER EX) Apply 1 application topically daily as needed (skin).   . polyethylene glycol (MIRALAX / GLYCOLAX) packet Take 17 g  by mouth daily as needed for mild constipation.  . predniSONE (DELTASONE) 5 MG tablet 1 tablet on odd days, one half tablet on even days  . valsartan (DIOVAN) 320 MG tablet Take 320 mg by mouth at bedtime.      Allergies: Allergies as of 11/22/2016 - Review Complete 08/28/2016  Allergen Reaction Noted  . Codeine Other (See Comments) 07/21/2012  . Ultram [tramadol] Other (See Comments) 07/21/2012  . Januvia [sitagliptin] Rash 06/03/2016   Past Medical History:  Diagnosis Date  . Degenerative arthritis   . Diabetes mellitus without complication ([place])   . Gait disturbance   . Hypertension   . Multiple sclerosis ([place])   . Neuromuscular disorder (HCC)    Bilateral hand carpal  tunnel syndrome  . Obesity   . Spinal stenosis in cervical region   . Spinal stenosis of lumbosacral region   . Spinal stenosis, thoracic    Family History:  Mother with HTN and MS  Social History:  Lives alone Has 2 sons, one in [place] and 1 in [place] to [place] 3-5x per week Has daily [place] aide No alcohol, never smoker, no drugs  Review of Systems: A complete ROS was negative except as per HPI.  Physical Exam: Blood pressure (!) 99/58, pulse (!) 122, temperature 98.5 F (36.9 C), temperature source Oral, resp. rate 18, height 5\' 2"  (1.575 m), weight 178 lb (80.7 kg), SpO2 96 %.  Physical Exam  Constitutional: She is oriented to person, place, and time.  Tired appearing woman lying in stretcher in no acute distress  HENT:  Head: Normocephalic and atraumatic.  Eyes: Conjunctivae are normal. No scleral icterus.  Neck: Normal range of motion. Neck supple.  Cardiovascular: Regular rhythm.   Tachycardic  Pulmonary/Chest: Effort normal and breath sounds normal.  Abdominal: Soft. She exhibits no distension. There is no tenderness.  Musculoskeletal:  Extremities warm 5 cm ulcer on superior right buttock with purulent drainage Right heel with dusky discoloration and serosanguineous discharge, malodorous  Neurological: She is alert and oriented to person, place, and time.  No sensation below T10  Skin: Skin is warm and dry.  Psychiatric: She has a normal mood and affect. Her behavior is normal.         CBC Latest Ref Rng & Units 11/22/2016 06/03/2016 11/29/2015  WBC 4.0 - 10.5 K/uL 18.2(H) 14.5(H) 10.6  Hemoglobin 12.0 - 15.0 g/dL 8.5(L) - -  Hematocrit 36.0 - 46.0 % 26.6(L) 36.1 36.6  Platelets 150 - 400 K/uL 562(H) 333 442(H)   CMP Latest Ref Rng & Units 11/22/2016 06/03/2016 11/29/2015  Glucose 65 - 99 mg/dL 276(H) 288(H) 207(H)  BUN 6 - 20 mg/dL 9 19 9   Creatinine 0.44 - 1.00 mg/dL 0.87 0.75 0.53(L)  Sodium 135 - 145 mmol/L 133(L) 141 143  Potassium 3.5 - 5.1  mmol/L 2.4(LL) 5.2 3.7  Chloride 101 - 111 mmol/L 89(L) 98 97  CO2 22 - 32 mmol/L 27 29 29   Calcium 8.9 - 10.3 mg/dL 8.2(L) 9.6 9.7  Total Protein 6.5 - 8.1 g/dL 5.9(L) 6.2 6.2  Total Bilirubin 0.3 - 1.2 mg/dL 1.8(H) 0.3 0.2  Alkaline Phos 38 - 126 U/L 98 84 88  AST 15 - 41 U/L 26 9 11   ALT 14 - 54 U/L 24 7 7    Lactic Acid, Venous    Component Value Date/Time   LATICACIDVEN 1.32 11/22/2016 1554    Assessment & Plan by Problem: Principal Problem:   Sepsis ([place]) Active Problems:   Multiple sclerosis (  [place])   HTN (hypertension)   DM (diabetes mellitus) ([place])   Decubitus ulcer of buttock, stage 4 ([place])  76 y.o. female with paraplegia and multiple sclerosis who presents with sepsis likely source of the decubitus sacral ulcer.  She is tachycardic, mildly hypotensive, and with leukocytosis but currently afebrile, without lactic acidosis, mentating well, and with adequate perfusion after volume resuscitation.  We will continue broad-spectrum antibiotics and supportive care, and consult wound and plastics for management of wound.  #Sepsis #Decubitis Ulcer of Right Buttock Received 2-1/2 L volume resuscitation in the ED. -Continue Vanc and Zosyn -UA and urine culture, consider foley exchange -f/u blood cultures -Volume resuscitation as needed -Consult plastic surgery and wound care in the morning  #Hypokalemia -repeat BMP -replete K  #Anemia Microcytic, down from prior. -check ferritin and reticulocytes -Transfuse to >7.0  #Multiple Sclerosis On chronic physiologic dose prednisone (alternating 5 mg and 2.5 mg), would not expect HPA axis suppression. -Hold tecfidara -Continue Prednisone -Continue baclofen -If persistent hypotension consider stress dosing steroids  #DM On glimepride 2 mg daily and Lantus 10U qAM at home. -Hold glimepride -SSI  #HTN -Hold home amlodipine and valsartan  #Depression -Continue Cymbalta  Fluids: NS w/ 20 mEq KCl for 12 hours Diet:  carb DVT Prophylaxis: lovenox Code Status: DNR  Dispo: Admit patient to Inpatient with expected length of stay greater than 2 midnights.  Signed: Minus Liberty, MD 11/22/2016, 5:51 PM  Pager: [phone]

## 2016-11-22 NOTE — ED Provider Notes (Addendum)
[facility] DEPT Provider Note   CSN: [id number] Arrival date & time: 11/22/16  1404     History   Chief Complaint Chief Complaint  Patient presents with  . Skin Ulcer    HPI Laura Roberts is a 76 y.o. female.  HPI Patient reports that she has been getting ill for the past 4 days. She states that she has had loss of appetite, chills, generalized weakness and nausea. Patient has MS and is nonmobile. She reports due to lack of sensation she does not really experience pain in her lower body. She reports that she has a pressure ulcer that is supposed to get surgically excised at the beginning of June. She reports Monday seemed to be getting worse and she was started on antibiotics. She does not recall the name of the antibiotic she was given. She reports she has an indwelling Foley catheter. She is bedbound and dependent on her caregivers for mobility. She reports she also has a pressure ulcer on her right heal. She sustained a right tib-fib fracture from being dropped by a caregiver the end of February. She reports however due to her lack of sensation she did not x-ray his pain with this. Past Medical History:  Diagnosis Date  . Degenerative arthritis   . Diabetes mellitus without complication ([place])   . Gait disturbance   . Hypertension   . Multiple sclerosis ([place])   . Neuromuscular disorder (HCC)    Bilateral hand carpal tunnel syndrome  . Obesity   . Spinal stenosis in cervical region   . Spinal stenosis of lumbosacral region   . Spinal stenosis, thoracic     Patient Active Problem List   Diagnosis Date Noted  . Paraplegia ([place]) 06/01/2014  . Closed supracondylar fracture of right femur, periprosthetic 06/01/2014  . Femoral fracture ([place]) 06/01/2014  . Encounter for therapeutic drug monitoring 01/05/2014  . Foot drop, bilateral 10/13/2012  . Abnormality of gait 10/13/2012  . Neurogenic bladder 09/02/2012  . Neurogenic pain of lower extremity 09/02/2012  . SOB  (shortness of breath) 07/21/2012  . Weakness 07/21/2012  . Bronchitis 07/21/2012  . Multiple sclerosis exacerbation ([place]) 07/21/2012  . Cough 07/21/2012  . HTN (hypertension) 07/21/2012  . DM (diabetes mellitus) ([place]) 07/21/2012  . HLD (hyperlipidemia) 07/21/2012  . Tachycardia 07/21/2012  . GERD (gastroesophageal reflux disease) 07/21/2012  . Multiple sclerosis ([place])     Past Surgical History:  Procedure Laterality Date  . ABDOMINAL HYSTERECTOMY    . BACK SURGERY    . CATARACT EXTRACTION Right   . CHOLECYSTECTOMY    . FEMUR IM NAIL Right 06/02/2014   Procedure: INTRAMEDULLARY (IM) RETROGRADE FEMORAL NAILING;  Surgeon: Johnny Bridge, MD;  Location: [facility];  Service: Orthopedics;  Laterality: Right;  . left hand carpal tunnel surgery    . REPLACEMENT TOTAL KNEE BILATERAL  2004    OB History    No data available       Home Medications    Prior to Admission medications   Medication Sig Start Date End Date Taking? Authorizing Provider  acetaminophen (TYLENOL) 325 MG tablet Take 325 mg by mouth 2 (two) times daily.     [provider]  amLODipine (NORVASC) 5 MG tablet Take 10 mg by mouth at bedtime.     [provider]  ARTIFICIAL TEAR OP Apply 1 drop to eye 4 (four) times daily as needed (dry eyes).     [provider]  baclofen (LIORESAL) 10 MG tablet 1.5 tablet in  the morning, 1 tablet midday, two tablets at night 11/29/15   Ward Givens, NP  calcium citrate (CALCITRATE - DOSED IN MG ELEMENTAL CALCIUM) 950 MG tablet Take 200 mg of elemental calcium by mouth daily.    [provider]  cholecalciferol (VITAMIN D) 1000 UNITS tablet Take 1,000 Units by mouth daily.    [provider]  Dimethyl Fumarate (TECFIDERA) 240 MG CPDR Take 1 capsule (240 mg total) by mouth 2 (two) times daily. 03/13/16   Kathrynn Ducking, MD  DULoxetine (CYMBALTA) 30 MG capsule Take 90 mg by mouth daily. Take with 60mg  for a total of 90mg     [provider]  DULoxetine (CYMBALTA) 60 MG capsule Take 90 mg by mouth daily. Take along with a 30mg     [provider]  gabapentin (NEURONTIN) 300 MG capsule Take 300 mg by mouth 3 (three) times daily.    [provider]  glimepiride (AMARYL) 2 MG tablet Take 2 mg by mouth daily.     [provider]  lidocaine (LIDODERM) 5 % Place 1 patch onto the skin daily as needed (pain). Remove & Discard patch within 12 hours or as directed by MD     [provider]  omeprazole (PRILOSEC) 20 MG capsule Take 20 mg by mouth daily.    [provider]  Petrolatum-Zinc Oxide (SENSI-CARE PROTECTIVE BARRIER EX) Apply 1 application topically daily as needed (skin).     [provider]  polyethylene glycol (MIRALAX / GLYCOLAX) packet Take 17 g by mouth daily as needed for mild constipation. Patient taking differently: Take 17 g by mouth every other day.  06/06/14   Dellia Nims, MD  predniSONE (DELTASONE) 5 MG tablet 1 tablet on odd days, one half tablet on even days 11/22/14   Kathrynn Ducking, MD  valsartan (DIOVAN) 320 MG tablet Take 320 mg by mouth at bedtime.     [provider]    Family History Family History  Problem Relation Age of Onset  . Multiple sclerosis Other        neices.   . Cancer Mother   . Diabetes Mother   . GI Bleed Sister        diverticulitis    Social History Social History  Substance Use Topics  . Smoking status: Never Smoker  . Smokeless tobacco: Never Used  . Alcohol use No     Allergies   Codeine; Ultram [tramadol]; and Januvia [sitagliptin]   Review of Systems Review of Systems 10 Systems reviewed and are negative for acute change except as noted in the HPI.   Physical Exam Updated Vital Signs BP (!) 99/58   Pulse (!) 122   Temp 98.5 F (36.9 C) (Oral)   Resp 18   Ht 5\' 2"  (1.575 m)   Wt 80.7 kg (178 lb)   SpO2 96%   BMI 32.56 kg/m   Physical Exam  Constitutional: She is oriented to  person, place, and time.  Patient is alert but mildly ill appearance. No respiratory distress. She is bedbound with physical deconditioning. Moderate obesity.  HENT:  Head: Normocephalic and atraumatic.  Nose: Nose normal.  Mouth/Throat: Oropharynx is clear and moist.  Eyes: Conjunctivae and EOM are normal.  Neck: Neck supple.  Cardiovascular: Intact distal pulses.   Tachycardia no appreciable rub murmur gallop.  Pulmonary/Chest: Effort normal and breath sounds normal.  Abdominal: Soft. She exhibits no distension. There is no tenderness. There is no guarding.  Musculoskeletal:  Patient's right  buttock has large decubitus wound. This is over the ishium. Is approximately 6 x 5 cm with a depth of 3 cm. There is purulent, malodorous  material within it. No erythema of the remainder of the buttock.  More superficial ulceration of the right heel. Approximate 1.5 x 1 cm into the deep dermis. No general erythema or cellulitis of the foot.  Neurological: She is alert and oriented to person, place, and time.  Patient is paraplegic and does not have any spontaneous use of the extremities from the waist down. She is alert and interactive. Mental status is clear.  Skin: Skin is warm and dry.  Psychiatric: She has a normal mood and affect.     ED Treatments / Results  Labs (all labs ordered are listed, but only abnormal results are displayed) Labs Reviewed  COMPREHENSIVE METABOLIC PANEL - Abnormal; Notable for the following:       Result Value   Sodium 133 (*)    Potassium 2.4 (*)    Chloride 89 (*)    Glucose, Bld 276 (*)    Calcium 8.2 (*)    Total Protein 5.9 (*)    Albumin 2.1 (*)    Total Bilirubin 1.8 (*)    Anion gap 17 (*)    All other components within normal limits  CBC WITH DIFFERENTIAL/PLATELET - Abnormal; Notable for the following:    WBC 18.2 (*)    RBC 3.47 (*)    Hemoglobin 8.5 (*)    HCT 26.6 (*)    MCV 76.7 (*)    MCH 24.5 (*)    RDW 17.5 (*)    Platelets 562 (*)      Neutro Abs 15.7 (*)    All other components within normal limits  URINE CULTURE  CULTURE, BLOOD (ROUTINE X 2)  CULTURE, BLOOD (ROUTINE X 2)  PROTIME-INR  URINALYSIS, ROUTINE W REFLEX MICROSCOPIC  I-STAT TROPOININ, ED  I-STAT CG4 LACTIC ACID, ED    EKG  EKG Interpretation None       Radiology Dg Chest Port 1 View  Result Date: 11/22/2016 CLINICAL DATA:  Painful abdominal wound. History of hypertension and diabetes. EXAM: PORTABLE CHEST 1 VIEW COMPARISON:  Radiographs 11/30/2014 and 06/01/2014. FINDINGS: 1459 hour. Patient rotation to the left. Allowing for this, the heart size and mediastinal contours are stable. Predominantly linear opacities at both lung bases likely reflect atelectasis or scarring. There is no edema, confluent airspace opacity, pleural effusion or pneumothorax. Glenohumeral degenerative changes are present bilaterally with narrowing of the subacromial space. IMPRESSION: No acute cardiopulmonary process. Probable mild bibasilar atelectasis or scarring. Electronically Signed   By: Richardean Sale M.D.   On: 11/22/2016 15:13    Procedures Procedures (including critical care time) CRITICAL CARE Performed by: Charlesetta Shanks   Total critical care time: 30 minutes  Critical care time was exclusive of separately billable procedures and treating other patients.  Critical care was necessary to treat or prevent imminent or life-threatening deterioration.  Critical care was time spent personally by me on the following activities: development of treatment plan with patient and/or surrogate as well as nursing, discussions with consultants, evaluation of patient's response to treatment, examination of patient, obtaining history from patient or surrogate, ordering and performing treatments and interventions, ordering and review of laboratory studies, ordering and review of radiographic studies, pulse oximetry and re-evaluation of patient's condition. Medications Ordered in  ED Medications  piperacillin-tazobactam (ZOSYN) IVPB 3.375 g (3.375 g Intravenous New Bag/Given 11/22/16 1720)  vancomycin (VANCOCIN) IVPB  1000 mg/200 mL premix (1,000 mg Intravenous New Bag/Given 11/22/16 1719)  sodium chloride 0.9 % bolus 500 mL (500 mLs Intravenous New Bag/Given 11/22/16 1714)  potassium chloride 10 mEq in 100 mL IVPB (not administered)  potassium chloride SA (K-DUR,KLOR-CON) CR tablet 40 mEq (not administered)  ondansetron (ZOFRAN) injection 4 mg (not administered)  0.9 %  sodium chloride infusion ( Intravenous New Bag/Given 11/22/16 1714)     Initial Impression / Assessment and Plan / ED Course  I have reviewed the triage vital signs and the nursing notes.  Pertinent labs & imaging results that were available during my care of the patient were reviewed by me and considered in my medical decision making (see chart for details).     Consult: Pending internal medicine teaching service for admission. Final Clinical Impressions(s) / ED Diagnoses   Final diagnoses:  Decubitus ulcer of sacral region, stage 4 (HCC)  Wound infection  Leukocytosis, unspecified type  Tachycardia   Patient has had general constitutional symptoms of nausea weakness and anorexia. She is tachycardic with significant leukocytosis. It appears most likely etiology is decubitus wound on her buttock. At this time, Zosyn and mitomycin have been initiated. Patient is also hypokalemic. Fluid hydration and potassium replacement initiated. Plan will be for admission. Patient's blood pressure has decreased slightly and heart rate increased,  will initiate sepsis protocol. New Prescriptions New Prescriptions   No medications on file     Charlesetta Shanks, MD 11/22/16 [place], [place], MD 11/22/16 1745

## 2016-11-22 NOTE — ED Notes (Signed)
Patient placed on her left side for comfort. Wound is on the lower part of her right butt cheek. wond is presently packed.

## 2016-11-22 NOTE — Progress Notes (Signed)
Pharmacy Antibiotic Note  Laura Roberts is a 76 y.o. female admitted on 11/22/2016 with wound infection.  Pharmacy has been consulted for vancomycin and zosyn dosing. WBC 18.2, afebrile, LA 1.32  Plan: Vancomycin 1000mg  IV once then 750mg  IV every 12 hours.  Goal trough 15-20 mcg/mL. Zosyn 3.375g IV q8h (4 hour infusion). F/U clinical progrssion and LOT  Height: 5\' 2"  (157.5 cm) Weight: 178 lb (80.7 kg) IBW/kg (Calculated) : 50.1  Temp (24hrs), Avg:98.5 F (36.9 C), Min:98.5 F (36.9 C), Max:98.5 F (36.9 C)   Recent Labs Lab 11/22/16 1536 11/22/16 1554  WBC 18.2*  --   CREATININE 0.87  --   LATICACIDVEN  --  1.32    Estimated Creatinine Clearance: 55.8 mL/min (by C-G formula based on SCr of 0.87 mg/dL).    Allergies  Allergen Reactions  . Codeine Other (See Comments)    Difficult breathing and skin problem  . Ultram [Tramadol] Other (See Comments)    Difficult breathing and skin peeling  . Januvia [Sitagliptin] Rash    Blisters    Antimicrobials this admission: 5/25 Vancomycin>> 5/25 Zosyn>>  Dose adjustments this admission:   Microbiology results: 5/25 BCx:  5/25 UCx:     Thank you for allowing pharmacy to be a part of this patient's care.  Jodean Lima Montrail Mehrer 11/22/2016 6:41 PM

## 2016-11-22 NOTE — ED Notes (Signed)
Patient has her own medication (Tecfidera ) with her placed in patient belonging bad with clothes.

## 2016-11-22 NOTE — ED Notes (Signed)
Attempted report x1. 

## 2016-11-22 NOTE — ED Triage Notes (Signed)
Patient c/o of pain in her right buttocks, states she was at [facility] today and wasn't feeling well . States she has a stage 4 pressure ulcer right buttocks, and is scheduled to see plastic surgeon  6/4. C/o decreased appetite

## 2016-11-22 NOTE — ED Notes (Signed)
Patient has indwelling foley from home

## 2016-11-23 ENCOUNTER — Encounter (HOSPITAL_COMMUNITY): Payer: Self-pay | Admitting: *Deleted

## 2016-11-23 DIAGNOSIS — I959 Hypotension, unspecified: Secondary | ICD-10-CM

## 2016-11-23 DIAGNOSIS — Z66 Do not resuscitate: Secondary | ICD-10-CM

## 2016-11-23 DIAGNOSIS — G8220 Paraplegia, unspecified: Secondary | ICD-10-CM

## 2016-11-23 DIAGNOSIS — D509 Iron deficiency anemia, unspecified: Secondary | ICD-10-CM

## 2016-11-23 DIAGNOSIS — E46 Unspecified protein-calorie malnutrition: Secondary | ICD-10-CM

## 2016-11-23 DIAGNOSIS — R000 Tachycardia, unspecified: Secondary | ICD-10-CM

## 2016-11-23 DIAGNOSIS — F329 Major depressive disorder, single episode, unspecified: Secondary | ICD-10-CM

## 2016-11-23 DIAGNOSIS — E876 Hypokalemia: Secondary | ICD-10-CM

## 2016-11-23 DIAGNOSIS — Z7952 Long term (current) use of systemic steroids: Secondary | ICD-10-CM

## 2016-11-23 DIAGNOSIS — D72829 Elevated white blood cell count, unspecified: Secondary | ICD-10-CM

## 2016-11-23 LAB — BASIC METABOLIC PANEL
ANION GAP: 10 (ref 5–15)
BUN: <5 mg/dL — ABNORMAL LOW (ref 6–20)
CO2: 29 mmol/L (ref 22–32)
Calcium: 8 mg/dL — ABNORMAL LOW (ref 8.9–10.3)
Chloride: 100 mmol/L — ABNORMAL LOW (ref 101–111)
Creatinine, Ser: 0.65 mg/dL (ref 0.44–1.00)
GFR calc Af Amer: >60 mL/min (ref >60)
GFR calc non Af Amer: >60 mL/min (ref >60)
Glucose, Bld: 167 mg/dL — ABNORMAL HIGH (ref 65–99)
Potassium: 2.5 mmol/L — CL (ref 3.5–5.1)
Sodium: 139 mmol/L (ref 135–145)

## 2016-11-23 LAB — GLUCOSE, CAPILLARY
GLUCOSE-CAPILLARY: 204 mg/dL — AB (ref 65–99)
GLUCOSE-CAPILLARY: 210 mg/dL — AB (ref 65–99)
Glucose-Capillary: 258 mg/dL — ABNORMAL HIGH (ref 65–99)

## 2016-11-23 LAB — MRSA PCR SCREENING: MRSA by PCR: NEGATIVE

## 2016-11-23 LAB — CBC
HCT: 26.5 % — ABNORMAL LOW (ref 36.0–46.0)
HEMOGLOBIN: 8.3 g/dL — AB (ref 12.0–15.0)
MCH: 24.3 pg — ABNORMAL LOW (ref 26.0–34.0)
MCHC: 31.3 g/dL (ref 30.0–36.0)
MCV: 77.7 fL — AB (ref 78.0–100.0)
Platelets: 577 10*3/uL — ABNORMAL HIGH (ref 150–400)
RBC: 3.41 MIL/uL — ABNORMAL LOW (ref 3.87–5.11)
RDW: 17.7 % — ABNORMAL HIGH (ref 11.5–15.5)
WBC: 10.1 10*3/uL (ref 4.0–10.5)

## 2016-11-23 LAB — MAGNESIUM: Magnesium: 1.5 mg/dL — ABNORMAL LOW (ref 1.7–2.4)

## 2016-11-23 MED ORDER — GLUCERNA SHAKE PO LIQD: 237.0000 mL | Freq: Three times a day (TID) | ORAL | Status: DC

## 2016-11-23 MED ORDER — PRO-STAT SUGAR FREE PO LIQD
30.0000 mL | Freq: Two times a day (BID) | ORAL | Status: DC
  Administered 2016-11-23 – 2016-11-26 (×6): 30 mL via ORAL
  Filled 2016-11-23 (×6): qty 30

## 2016-11-23 MED ORDER — POTASSIUM CHLORIDE IN NACL 20-0.9 MEQ/L-% IV SOLN
INTRAVENOUS | Status: AC
  Administered 2016-11-23: 12:00:00 via INTRAVENOUS
  Filled 2016-11-23: qty 1000

## 2016-11-23 MED ORDER — POTASSIUM CHLORIDE 10 MEQ/100ML IV SOLN
10.0000 meq | INTRAVENOUS | Status: DC
  Administered 2016-11-23 (×2): 10 meq via INTRAVENOUS
  Filled 2016-11-23 (×2): qty 100

## 2016-11-23 MED ORDER — GLUCERNA SHAKE PO LIQD
237.0000 mL | Freq: Two times a day (BID) | ORAL | Status: DC
  Administered 2016-11-24 – 2016-11-26 (×5): 237 mL via ORAL

## 2016-11-23 MED ORDER — GABAPENTIN 300 MG PO CAPS
300.0000 mg | ORAL_CAPSULE | Freq: Three times a day (TID) | ORAL | Status: DC
  Administered 2016-11-23 – 2016-11-26 (×11): 300 mg via ORAL
  Filled 2016-11-23 (×11): qty 1

## 2016-11-23 MED ORDER — POTASSIUM CHLORIDE CRYS ER 20 MEQ PO TBCR
40.0000 meq | EXTENDED_RELEASE_TABLET | Freq: Two times a day (BID) | ORAL | Status: AC
  Administered 2016-11-23 (×2): 40 meq via ORAL
  Filled 2016-11-23 (×2): qty 2

## 2016-11-23 MED ORDER — GABAPENTIN 600 MG PO TABS: 300.0000 mg | ORAL_TABLET | Freq: Three times a day (TID) | ORAL | Status: DC

## 2016-11-23 MED ORDER — DEXTROSE 5 % IV SOLN
3.0000 g | Freq: Once | INTRAVENOUS | Status: AC
  Administered 2016-11-23: 3 g via INTRAVENOUS
  Filled 2016-11-23 (×2): qty 6

## 2016-11-23 NOTE — Progress Notes (Signed)
Initial Nutrition Assessment  DOCUMENTATION CODES:  Obesity unspecified  INTERVENTION:  Glucerna Shake po TID, each supplement provides 220 kcal and 10 grams of protein  Will order 30 mL Prostat BID, each supplement provides 100 kcal and 15 grams of protein.  NUTRITION DIAGNOSIS:  Increased nutrient needs related to wound healing as evidenced by estimated nutritional requirements for this condition  GOAL:  Patient will meet greater than or equal to 90% of their needs  MONITOR:  PO intake, Supplement acceptance, Labs, Weight trends, Skin  REASON FOR ASSESSMENT:  Consult Wound healing  ASSESSMENT:  76 y/o female PMHx MS (paraplegic, neurogenic bowel and bladder), DM, HTN, Obesity. Presents w/ nausea, chills, extreme fatigue, weakness. Had recently developed decubitus ulcer. Worked up for sepsis likely due to ulcer. RD consulted for wound healing  Pt reports that she hasnt eaten well this past week due to the doxycycline she was placed on. She says it made her sick. Prior to that she was at baseline. She says she has been gaining weight over the past few months due to worsening immobility related to her MS.   She had her lunch tray in front of her with most of it consumed. She says she didn't care for some items. RD showed her menu and how to order what she would like. She was agreeable to supplements at this time.   Physical Exam: WDL, obese  Labs: H/H:8.3/26.5, Glucose 160-260. K: 2.5, Mag: 1.5. Albumin: 2.1. WBC was 18.2 on admit.  Medications: Glucerna TID, Insulin, KCL, PPI, Prednisone,  IV abx, Mag, ivf   Recent Labs Lab 11/22/16 1536 11/23/16 0251  NA 133* 139  K 2.4* 2.5*  CL 89* 100*  CO2 27 29  BUN 9 <5*  CREATININE 0.87 0.65  CALCIUM 8.2* 8.0*  MG  --  1.5*  GLUCOSE 276* 167*   Diet Order:  Diet Carb Modified Fluid consistency: Thin; Room service appropriate? Yes  Skin: Stage 4 decubitus ulcer to R buttocks, Stage 2 ulcer to Right heel,   Last BM:   5/25-incontinent  Height:  Ht Readings from Last 1 Encounters:  11/22/16 5\' 2"  (1.575 m)   Weight:  Wt Readings from Last 1 Encounters:  11/22/16 180 lb 4.8 oz (81.8 kg)   Wt Readings from Last 10 Encounters:  11/22/16 180 lb 4.8 oz (81.8 kg)  08/28/16 178 lb (80.7 kg)  06/03/16 176 lb (79.8 kg)  11/29/15 176 lb (79.8 kg)  05/30/15 172 lb (78 kg)  11/22/14 155 lb (70.3 kg)  07/18/14 157 lb (71.2 kg)  06/01/14 169 lb (76.7 kg)  07/07/13 170 lb (77.1 kg)  09/02/12 196 lb 11.2 oz (89.2 kg)   Ideal Body Weight:  45 kg (Adjusted for paraplegia)  BMI:  Body mass index is 32.98 kg/m.  Estimated Nutritional Needs:  Kcal:  1650-1850 kcals Protein:  68-77g Pro (1.5-1.7 g/kg ibw) Fluid:  1.7-1.9 L fluid  EDUCATION NEEDS:  No education needs identified at this time  Burtis Junes RD, LDN, [facility] Pager: [phone] 11/23/2016 2:16 PM

## 2016-11-23 NOTE — Progress Notes (Signed)
  Date: 11/23/2016  Patient name: Laura Roberts  Medical record number: [id number]  Date of birth: 08/25/1940   I have seen and evaluated Laura Roberts and discussed their care with the Residency Team. Laura Roberts is a 76 year old female who came to the hospital with chief complaint of right buttock wound. I have reviewed Dr. Jenne Campus H&P and additional information we got today was that she has had a 5 pound weight loss in the past 2 months. Today, she requests ability to eat as she is currently nothing by mouth. She states that she feels a little bit better with less weakness and fatigue.  PMHx, Fam Hx, and/or Soc Hx : Per Dr. Rowe Pavy H&P  Vitals:   11/23/16 1100 11/23/16 1200  BP: 121/76 127/70  Pulse: (!) 122 (!) 111  Resp: (!) 25 17  Temp:    NAD HRRR no MRG ABD + BS, S/NT//ND Ext : R heel wound R Buttock wound deep, about 5 cm, fibrinous at base, no odor, no overt discharge.  Albumin 2.1  I personally viewed her CXR images and confirmed by reading with the official read. AP portable, semi-erect. Rotated, NAD.  I personally viewed his EKG and confirmed by reading with the official read. Sinus tach, LAD, NSTW changes  Assessment and Plan: I have seen and evaluated the patient as outlined above. I agree with the formulated Assessment and Plan as detailed in the residents' note, with the following changes: Laura Roberts is a 76 year old woman with a right buttock ulcer which is deep. She has failed outpatient management as she was getting daily wound care with her [facility] program. She was septic on admission with low blood pressure, tachycardia, and a leukocytosis. All of those parameters have improved with IV fluids and antibiotics. In addition to wound care, she will need surgical evaluation as this wound is quite deep and may need assistance daily. We also need to maximize her nutritional status with supplements.  1. Right buttock decubitus ulcer - continue vague and Zosyn.  Surgical evaluation. Wound care.   2. Sepsis secondary to decubitus ulcer - resolved  3. Protein calorie malnutrition - nutrition consult for dietary supplements.  4. Healed wound - wound care consult   Bartholomew Crews, MD 5/26/201812:18 PM

## 2016-11-23 NOTE — Progress Notes (Signed)
Physical Therapy Wound Treatment Patient Details  Name: Laura Roberts MRN: [id number] Date of Birth: 01/05/1941  Today's Date: 11/23/2016 Time: 1515-1545 Time Calculation (min): 30 min  Subjective  Patient and Family Stated Goals: get this wound healed up so I can go to [facility] only 3 day/week Prior Treatments: Dressing changes at [facility]  Pain Score: Pain Score: 0-No pain  Wound Assessment     Wound / Incision (Open or Dehisced) 11/22/16 Diabetic ulcer Buttocks Right Stage 4 Decubitus Ulcer on Right buttocks  (Active)  Dressing Type Gauze (Comment);Moist to dry 11/23/2016  3:00 PM  Dressing Changed Changed 11/23/2016  3:00 PM  Dressing Status Clean;Dry;Intact 11/23/2016  3:00 PM  Dressing Change Frequency Twice a day 11/23/2016  3:00 PM  Site / Wound Assessment Red;Pink 11/23/2016  3:00 PM  Peri-wound Assessment Intact 11/23/2016  3:00 PM  Wound Length (cm) 5 cm 11/23/2016  3:00 PM  Wound Width (cm) 4 cm 11/23/2016  3:00 PM  Wound Depth (cm) 5 cm 11/23/2016  3:00 PM  Margins Unattached edges (unapproximated) 11/23/2016  3:00 PM  Drainage Amount Scant 11/23/2016  3:00 PM  Drainage Description Serosanguineous 11/23/2016  3:00 PM  Treatment Cleansed;Debridement (Selective);Hydrotherapy (Pulse lavage);Packing (Saline gauze) 11/23/2016  3:00 PM   Hydrotherapy Pulsed lavage therapy - wound location: R buttock Pulsed Lavage with Suction (psi): 4 psi Pulsed Lavage with Suction - Normal Saline Used: 1000 mL Pulsed Lavage Tip: Tip with splash shield Selective Debridement Selective Debridement - Location: R buttock Selective Debridement - Tools Used: Scissors;Forceps Selective Debridement - Tissue Removed: necrotic tissue--deep   Wound Assessment and Plan  Wound Therapy - Assess/Plan/Recommendations Wound Therapy - Clinical Statement: Pt can benefit from the pulsed lavage to decrease the bioburden of the wound including general cleansing.  All necrosis should be removed next session..  Wouldn't it be  appropriate to try VAC dressing. Wound Therapy - Functional Problem List: MS/para, immobility and extended time in the w/c has probably and will affect this wound. Factors Delaying/Impairing Wound Healing: Altered sensation;Diabetes Mellitus;Incontinence;Infection - systemic/local;Immobility;Multiple medical problems Hydrotherapy Plan: Debridement;Patient/family education;Pulsatile lavage with suction;Dressing change Wound Therapy - Frequency: 6X / week Wound Therapy - Current Recommendations: Case manager/social work;WOC nurse Wound Therapy - Follow Up Recommendations: Other (comment) (PACE nursing for continued wound care.) Wound Plan: see above  Wound Therapy Goals- Improve the function of patient's integumentary system by progressing the wound(s) through the phases of wound healing (inflammation - proliferation - remodeling) by: Decrease Necrotic Tissue to: 0% Decrease Necrotic Tissue - Progress: Goal set today Increase Granulation Tissue to: 100% Increase Granulation Tissue - Progress: Goal set today Improve Drainage Characteristics: Min;Serous Improve Drainage Characteristics - Progress: Goal set today Goals/treatment plan/discharge plan were made with and agreed upon by patient/family: Yes Time For Goal Achievement: 7 days Wound Therapy - Potential for Goals: Good  Goals will be updated until maximal potential achieved or discharge criteria met.  Discharge criteria: when goals achieved, discharge from hospital, MD decision/surgical intervention, no progress towards goals, refusal/missing three consecutive treatments without notification or medical reason.  GP      V  11/23/2016, 4:11 PM 11/23/2016  Ken , PT [phone]  (pager)  

## 2016-11-23 NOTE — Consult Note (Signed)
Reason for Consult:  Right buttocks stage IV pressure ulcer Referring Physician: Dr. Chinita Pester PCP: [place]; Phineas Douglas, Gardner Candle, MD [facility]  Chief complaint: Right buttocks pain with history of stage IV pressure ulcer of the buttocks   Laura Roberts is an 76 y.o. female.  HPI: Patient presented to emergency department with complaints of buttocks pain, nausea, fatigue, weakness and chills. She's been getting local wound care and followed in [place]. The group caring for her is noted above. She has a history multiple sclerosis with paraplegia, neurogenic bladder and bowel, diabetes, hypertension, and obesity. She is a wheelchair for mobility, chronic Foley catheter for neurogenic bladder baseline fecal incontinence with no sensation below her mid back. On admission has an open decubitus right buttocks.  We have been asked to see and evaluate her right decubitus.  She is wheelchair-bound., No sensation from her lower back down. She lives independently with a home. She spends 5 days a week at an adult care center. She reports this ulcer has been present for about one month. Notes from cancer everywhere 11/07/16 office visit is planned for sharp in the OR continued wound care and wound care center consideration of a diverting colostomy.   Past Medical History:  Diagnosis Date  . Degenerative arthritis   . Diabetes mellitus without complication ([facility])   . Gait disturbance   . Hypertension   . Multiple sclerosis ([place])   . Neuromuscular disorder ([facility])    Bilateral hand carpal tunnel syndrome  . Obesity   . Spinal stenosis in cervical region   . Spinal stenosis of lumbosacral region   . Spinal stenosis, thoracic     Past Surgical History:  Procedure Laterality Date  . ABDOMINAL HYSTERECTOMY    . BACK SURGERY    . CATARACT EXTRACTION Right   . CHOLECYSTECTOMY    . FEMUR IM NAIL Right 06/02/2014   Procedure: INTRAMEDULLARY (IM)  RETROGRADE FEMORAL NAILING;  Surgeon: Johnny Bridge, MD;  Location: [facility];  Service: Orthopedics;  Laterality: Right;  . left hand carpal tunnel surgery    . REPLACEMENT TOTAL KNEE BILATERAL  2004    Family History  Problem Relation Age of Onset  . Multiple sclerosis Other        neices.   . Cancer Mother   . Diabetes Mother   . GI Bleed Sister        diverticulitis    Social History:  reports that she has never smoked. She has never used smokeless tobacco. She reports that she does not drink alcohol or use drugs.  Allergies:  Allergies  Allergen Reactions  . Codeine Other (See Comments)    Difficult breathing and skin problem  . Ultram [Tramadol] Other (See Comments)    Difficult breathing and skin peeling  . Januvia [Sitagliptin] Rash    Blisters    Medications:  Prior to Admission:  Prescriptions Prior to Admission  Medication Sig Dispense Refill Last Dose  . acetaminophen (TYLENOL) 325 MG tablet Take 325 mg by mouth 2 (two) times daily.    11/22/2016 at Unknown time  . amLODipine (NORVASC) 5 MG tablet Take 10 mg by mouth at bedtime.    11/21/2016 at Unknown time  . ARTIFICIAL TEAR OP Apply 1 drop to eye 4 (four) times daily as needed (dry eyes).    11/22/2016 at Unknown time  . baclofen (LIORESAL) 10 MG tablet 1.5 tablet in the morning, 1 tablet midday, two tablets  at night 135 tablet 5 11/22/2016 at 12p  . calcium citrate (CALCITRATE - DOSED IN MG ELEMENTAL CALCIUM) 950 MG tablet Take 200 mg of elemental calcium by mouth daily.   11/21/2016 at Unknown time  . cholecalciferol (VITAMIN D) 1000 UNITS tablet Take 1,000 Units by mouth daily.   11/21/2016 at Unknown time  . Dimethyl Fumarate (TECFIDERA) 240 MG CPDR Take 1 capsule (240 mg total) by mouth 2 (two) times daily. 60 capsule 11 11/22/2016 at Unknown time  . doxycycline (VIBRA-TABS) 100 MG tablet Take 100 mg by mouth 2 (two) times daily. Started on 11-18-16 for 14 days.   11/21/2016 at Unknown time  . DULoxetine (CYMBALTA) 60  MG capsule Take 90 mg by mouth daily. Take along with a 53m   11/21/2016 at Unknown time  . glimepiride (AMARYL) 2 MG tablet Take 2 mg by mouth daily.    11/21/2016 at Unknown time  . insulin glargine (LANTUS) 100 UNIT/ML injection Inject 10 Units into the skin at bedtime.   11/21/2016 at Unknown time  . lidocaine (LIDODERM) 5 % Place 1 patch onto the skin daily as needed (pain). Remove & Discard patch within 12 hours or as directed by MD    Past Month at Unknown time  . omeprazole (PRILOSEC) 20 MG capsule Take 20 mg by mouth daily.   11/21/2016 at Unknown time  . Petrolatum-Zinc Oxide (SENSI-CARE PROTECTIVE BARRIER EX) Apply 1 application topically daily as needed (skin).    11/21/2016 at Unknown time  . polyethylene glycol (MIRALAX / GLYCOLAX) packet Take 17 g by mouth daily as needed for mild constipation. 14 each 0 Past Week at Unknown time  . predniSONE (DELTASONE) 5 MG tablet 1 tablet on odd days, one half tablet on even days   11/21/2016 at Unknown time  . valsartan (DIOVAN) 320 MG tablet Take 320 mg by mouth at bedtime.    11/21/2016 at Unknown time   Scheduled: . DULoxetine  90 mg Oral Daily  . enoxaparin (LOVENOX) injection  40 mg Subcutaneous Q24H  . gabapentin  300 mg Oral TID  . insulin aspart  0-15 Units Subcutaneous TID WC  . pantoprazole  40 mg Oral Daily  . potassium chloride  40 mEq Oral BID  . predniSONE  2.5 mg Oral QODAY  . sodium chloride flush  3 mL Intravenous Q12H   Continuous: . 0.9 % NaCl with KCl 20 mEq / L    . magnesium sulfate 1 - 4 g bolus IVPB    . piperacillin-tazobactam (ZOSYN)  IV Stopped (11/23/16 0341)  . vancomycin Stopped (11/23/16 07915   PAVW:[id number]**OR** acetaminophen, baclofen, lidocaine, polyethylene glycol, promethazine Anti-infectives    Start     Dose/Rate Route Frequency Ordered Stop   11/23/16 0600  vancomycin (VANCOCIN) IVPB 750 mg/150 ml premix     750 mg 150 mL/hr over 60 Minutes Intravenous Every 12 hours 11/22/16 1931      11/22/16 2200  piperacillin-tazobactam (ZOSYN) IVPB 3.375 g     3.375 g 12.5 mL/hr over 240 Minutes Intravenous Every 8 hours 11/22/16 1931     11/22/16 1645  piperacillin-tazobactam (ZOSYN) IVPB 3.375 g     3.375 g 100 mL/hr over 30 Minutes Intravenous  Once 11/22/16 1631 11/22/16 1901   11/22/16 1645  vancomycin (VANCOCIN) IVPB 1000 mg/200 mL premix     1,000 mg 200 mL/hr over 60 Minutes Intravenous  Once 11/22/16 1631 11/22/16 1901      Results for orders placed or performed during the  hospital encounter of 11/22/16 (from the past 48 hour(s))  Comprehensive metabolic panel     Status: Abnormal   Collection Time: 11/22/16  3:36 PM  Result Value Ref Range   Sodium 133 (L) 135 - 145 mmol/L   Potassium 2.4 (LL) 3.5 - 5.1 mmol/L    Comment: CRITICAL RESULT CALLED TO, READ BACK BY AND VERIFIED WITH: KJasmine December RN [phone] 1659 GREEN R    Chloride 89 (L) 101 - 111 mmol/L   CO2 27 22 - 32 mmol/L   Glucose, Bld 276 (H) 65 - 99 mg/dL   BUN 9 6 - 20 mg/dL   Creatinine, Ser 0.87 0.44 - 1.00 mg/dL   Calcium 8.2 (L) 8.9 - 10.3 mg/dL   Total Protein 5.9 (L) 6.5 - 8.1 g/dL   Albumin 2.1 (L) 3.5 - 5.0 g/dL   AST 26 15 - 41 U/L   ALT 24 14 - 54 U/L   Alkaline Phosphatase 98 38 - 126 U/L   Total Bilirubin 1.8 (H) 0.3 - 1.2 mg/dL   GFR calc non Af Amer >60 >60 mL/min   GFR calc Af Amer >60 >60 mL/min    Comment: (NOTE) The eGFR has been calculated using the CKD EPI equation. This calculation has not been validated in all clinical situations. eGFR's persistently <60 mL/min signify possible Chronic Kidney Disease.    Anion gap 17 (H) 5 - 15  CBC with Differential     Status: Abnormal   Collection Time: 11/22/16  3:36 PM  Result Value Ref Range   WBC 18.2 (H) 4.0 - 10.5 K/uL   RBC 3.47 (L) 3.87 - 5.11 MIL/uL   Hemoglobin 8.5 (L) 12.0 - 15.0 g/dL   HCT 26.6 (L) 36.0 - 46.0 %   MCV 76.7 (L) 78.0 - 100.0 fL   MCH 24.5 (L) 26.0 - 34.0 pg   MCHC 32.0 30.0 - 36.0 g/dL   RDW 17.5 (H) 11.5 -  15.5 %   Platelets 562 (H) 150 - 400 K/uL   Neutrophils Relative % 86 %   Neutro Abs 15.7 (H) 1.7 - 7.7 K/uL   Lymphocytes Relative 8 %   Lymphs Abs 1.5 0.7 - 4.0 K/uL   Monocytes Relative 6 %   Monocytes Absolute 1.0 0.1 - 1.0 K/uL   Eosinophils Relative 0 %   Eosinophils Absolute 0.1 0.0 - 0.7 K/uL   Basophils Relative 0 %   Basophils Absolute 0.0 0.0 - 0.1 K/uL  Protime-INR     Status: None   Collection Time: 11/22/16  3:36 PM  Result Value Ref Range   Prothrombin Time 14.0 11.4 - 15.2 seconds   INR 1.08   Culture, blood (routine x 2)     Status: None (Preliminary result)   Collection Time: 11/22/16  3:40 PM  Result Value Ref Range   Specimen Description BLOOD RIGHT ANTECUBITAL    Special Requests      BOTTLES DRAWN AEROBIC AND ANAEROBIC Blood Culture adequate volume   Culture NO GROWTH < 24 HOURS    Report Status PENDING   I-stat troponin, ED     Status: None   Collection Time: 11/22/16  3:52 PM  Result Value Ref Range   Troponin i, poc 0.00 0.00 - 0.08 ng/mL   Comment 3            Comment: Due to the release kinetics of cTnI, a negative result within the first hours of the onset of symptoms does not rule out myocardial infarction  with certainty. If myocardial infarction is still suspected, repeat the test at appropriate intervals.   I-Stat CG4 Lactic Acid, ED     Status: None   Collection Time: 11/22/16  3:54 PM  Result Value Ref Range   Lactic Acid, Venous 1.32 0.5 - 1.9 mmol/L  Culture, blood (routine x 2)     Status: None (Preliminary result)   Collection Time: 11/22/16  3:56 PM  Result Value Ref Range   Specimen Description BLOOD LEFT FOREARM    Special Requests      BOTTLES DRAWN AEROBIC AND ANAEROBIC Blood Culture adequate volume   Culture NO GROWTH < 24 HOURS    Report Status PENDING   Ferritin     Status: Abnormal   Collection Time: 11/22/16  7:40 PM  Result Value Ref Range   Ferritin 509 (H) 11 - 307 ng/mL  Reticulocytes     Status: Abnormal    Collection Time: 11/22/16  7:40 PM  Result Value Ref Range   Retic Ct Pct 1.9 0.4 - 3.1 %   RBC. 3.56 (L) 3.87 - 5.11 MIL/uL   Retic Count, Manual 67.6 19.0 - 186.0 K/uL  Urinalysis, Routine w reflex microscopic     Status: Abnormal   Collection Time: 11/22/16  8:01 PM  Result Value Ref Range   Color, Urine YELLOW YELLOW   APPearance CLEAR CLEAR   Specific Gravity, Urine <1.005 (L) 1.005 - 1.030   pH 5.5 5.0 - 8.0   Glucose, UA NEGATIVE NEGATIVE mg/dL   Hgb urine dipstick TRACE (A) NEGATIVE   Bilirubin Urine NEGATIVE NEGATIVE   Ketones, ur 15 (A) NEGATIVE mg/dL   Protein, ur NEGATIVE NEGATIVE mg/dL   Nitrite NEGATIVE NEGATIVE   Leukocytes, UA NEGATIVE NEGATIVE  Urinalysis, Microscopic (reflex)     Status: Abnormal   Collection Time: 11/22/16  8:01 PM  Result Value Ref Range   RBC / HPF 0-5 0 - 5 RBC/hpf   WBC, UA 0-5 0 - 5 WBC/hpf   Bacteria, UA RARE (A) NONE SEEN   Squamous Epithelial / LPF 0-5 (A) NONE SEEN   Urine-Other LESS THAN 10 mL OF URINE SUBMITTED   MRSA PCR Screening     Status: None   Collection Time: 11/22/16 11:00 PM  Result Value Ref Range   MRSA by PCR NEGATIVE NEGATIVE    Comment:        The GeneXpert MRSA Assay (FDA approved for NASAL specimens only), is one component of a comprehensive MRSA colonization surveillance program. It is not intended to diagnose MRSA infection nor to guide or monitor treatment for MRSA infections.   Basic metabolic panel     Status: Abnormal   Collection Time: 11/23/16  2:51 AM  Result Value Ref Range   Sodium 139 135 - 145 mmol/L   Potassium 2.5 (LL) 3.5 - 5.1 mmol/L    Comment: CRITICAL RESULT CALLED TO, READ BACK BY AND VERIFIED WITH: DILLARD A,RN 11/23/16 0429 WAYK    Chloride 100 (L) 101 - 111 mmol/L   CO2 29 22 - 32 mmol/L   Glucose, Bld 167 (H) 65 - 99 mg/dL   BUN <5 (L) 6 - 20 mg/dL   Creatinine, Ser 0.65 0.44 - 1.00 mg/dL   Calcium 8.0 (L) 8.9 - 10.3 mg/dL   GFR calc non Af Amer >60 >60 mL/min   GFR calc  Af Amer >60 >60 mL/min    Comment: (NOTE) The eGFR has been calculated using the CKD EPI equation. This calculation has  not been validated in all clinical situations. eGFR's persistently <60 mL/min signify possible Chronic Kidney Disease.    Anion gap 10 5 - 15  CBC     Status: Abnormal   Collection Time: 11/23/16  2:51 AM  Result Value Ref Range   WBC 10.1 4.0 - 10.5 K/uL   RBC 3.41 (L) 3.87 - 5.11 MIL/uL   Hemoglobin 8.3 (L) 12.0 - 15.0 g/dL   HCT 26.5 (L) 36.0 - 46.0 %   MCV 77.7 (L) 78.0 - 100.0 fL   MCH 24.3 (L) 26.0 - 34.0 pg   MCHC 31.3 30.0 - 36.0 g/dL   RDW 17.7 (H) 11.5 - 15.5 %   Platelets 577 (H) 150 - 400 K/uL  Magnesium     Status: Abnormal   Collection Time: 11/23/16  2:51 AM  Result Value Ref Range   Magnesium 1.5 (L) 1.7 - 2.4 mg/dL    Dg Chest Port 1 View  Result Date: 11/22/2016 CLINICAL DATA:  Painful abdominal wound. History of hypertension and diabetes. EXAM: PORTABLE CHEST 1 VIEW COMPARISON:  Radiographs 11/30/2014 and 06/01/2014. FINDINGS: 1459 hour. Patient rotation to the left. Allowing for this, the heart size and mediastinal contours are stable. Predominantly linear opacities at both lung bases likely reflect atelectasis or scarring. There is no edema, confluent airspace opacity, pleural effusion or pneumothorax. Glenohumeral degenerative changes are present bilaterally with narrowing of the subacromial space. IMPRESSION: No acute cardiopulmonary process. Probable mild bibasilar atelectasis or scarring. Electronically Signed   By: Richardean Sale M.D.   On: 11/22/2016 15:13    Review of Systems  Constitutional: Positive for chills, fever (She thinks she had no documentation.) and malaise/fatigue. Negative for diaphoresis and weight loss.  HENT: Negative.   Eyes: Negative.   Gastrointestinal: Positive for nausea.  Genitourinary:       Chronic Foley  Musculoskeletal:       Paraplegia/neurogenic bladder and bowel.  Skin:       Decubitus right buttocks   Neurological: Positive for focal weakness. Negative for dizziness, tingling, tremors, sensory change, speech change, seizures, weakness and headaches.       No sensation from her back down.  Endo/Heme/Allergies: Negative for environmental allergies. Does not bruise/bleed easily.  Psychiatric/Behavioral: Negative.    Blood pressure 114/72, pulse (!) 112, temperature 98.7 F (37.1 C), temperature source Oral, resp. rate (!) 24, height _0  (1.575 m), weight 81.8 kg (180 lb 4.8 oz), SpO2 95 %.  She has been afebrile but tachycardic. Borderline hypotension. Hypokalemia, elevated glucoses. WBC on admission was 18.2., Currently it is down to 10.2.. Ongoing anemia with hemoglobin 8.5 down to 8.3. Urinalysis on admission was negative for nitrates and 0-5 white cells/HPF. Urine cultures pending Physical Exam  Constitutional: She is oriented to person, place, and time. She appears well-developed and well-nourished. No distress.  HENT:  Head: Normocephalic and atraumatic.  Mouth/Throat: No oropharyngeal exudate.  Eyes: Right eye exhibits no discharge. Left eye exhibits no discharge. No scleral icterus.  Pupils are equal  Neck: Normal range of motion. Neck supple. No JVD present. No tracheal deviation present. No thyromegaly present.  Cardiovascular: Normal rate, regular rhythm, normal heart sounds and intact distal pulses.  Exam reveals no gallop.   No murmur heard. Respiratory: Effort normal and breath sounds normal. No respiratory distress. She has no wheezes. She has no rales. She exhibits no tenderness.  GI: Soft. Bowel sounds are normal. She exhibits no distension and no mass. There is no tenderness. There is no  rebound and no guarding.  Genitourinary:  Genitourinary Comments: Foley catheter in place, she is incontinent of stool.  Musculoskeletal: She exhibits no edema.  Lymphadenopathy:    She has no cervical adenopathy.  Neurological: She is alert and oriented to person, place, and time. No  cranial nerve deficit.  Skin: Skin is warm and dry. No rash noted. She is not diaphoretic. No erythema. No pallor.  See picture below,decubitus is on the right buttocks. It is 4 cm x 5 cm x 4.5-5 cm deep at places. It is clean, a bit soupy, white fibrinous material at the base.      Assessment/Plan: Right buttocks decubitus, stage IV Multiple sclerosis/paraplegia/Wheelchair-bound. Neurogenic bowel and bladder. Diabetes mellitus. Hypertension Hypokalemia Anemia Improving leukocytosis.  Plan: I'm going to have them begin wet-to-dry dressings with Kerlix/normal saline twice a day. Add hydrotherapy, Dr. Hulen Skains were reviewed.  Arcadio Cope 11/23/2016, 11:01 AM

## 2016-11-23 NOTE — Consult Note (Signed)
[facility] Nurse wound consult note Reason for Consult: Consulted simultaneously with Surgery for right buttock wound and also for right foot wounds Wound type:Pressure Pressure Injury POA: Yes Measurement: Right buttock wound (Stage 4):  5cm x 4cm x 5cm Pink, moist with moderate serous drainage. Periwound Skin intact.  Dressing is soiled with stool upon my assessment. Right posterior heel (Stage 3): 1.5cm x 2cm x 0.4cm with pale pink wound bed, scant serous exudate, macerated wound periphery. Right plantar heel (Unstageable): 3.5cm x 3.5cm eschar.  Stable, dry. No exudate. Wound bed: As described above Drainage (amount, consistency, odor) As described above Periwound: As described above. Dressing procedure/placement/frequency: [facility] has ordered hydrotherapy to the right hip; I have clarified that one-time order to be daily (rather 6x/week as they do not typically perform on Sundays).  Nursing to perform wound care in the pm and twice on Sunday while patient is in house.  I have also provided local care orders for the right foot using xeroform gauze twice daily and securing with Kerlix roll gauze rather than the silicone foam dressing that seems to be keeping the areas too moist.  Bilateral pressure redistribution heel boots are provided for pressure injury prevention. A mattress replacement is provided for pressure redistribution. [facility] nursing team will not follow, as [facility] and PT/Hydrotherapy are, but will remain available to this patient, the nursing and medical teams.  Please re-consult if needed. Thanks, Maudie Flakes, MSN, RN, [facility], [facility]  Pager# [phone]

## 2016-11-23 NOTE — Progress Notes (Addendum)
   Subjective: No acute events overnight. Feels better this morning with less generalized weakness and fatigue.    Objective:  Vital signs in last 24 hours: Vitals:   11/22/16 2100 11/22/16 2300 11/23/16 0000 11/23/16 0400  BP: 106/67 (!) 91/43 (!) 91/41 (!) 96/53  Pulse: (!) 120 (!) 115 (!) 118 (!) 110  Resp: 15 (!) 23 (!) 21 18  Temp: 99.3 F (37.4 C) 98.9 F (37.2 C)    TempSrc: Oral Axillary    SpO2: 97% 93% 95% 97%  Weight: 180 lb 4.8 oz (81.8 kg)     Height: 5\' 2"  (1.575 m)      Physical Exam  Constitutional: She is oriented to person, place, and time. She appears well-developed and well-nourished. No distress.  Cardiovascular: Regular rhythm.   Tachycardic  Pulmonary/Chest: Effort normal. No respiratory distress.  Neurological: She is alert and oriented to person, place, and time.  Skin: Skin is warm and dry.  Deep wound on right superior buttock, stable with purulence  Psychiatric: She has a normal mood and affect. Her behavior is normal.    Assessment/Plan:  Principal Problem:   Sepsis ([place]) Active Problems:   Multiple sclerosis (HCC)   HTN (hypertension)   DM (diabetes mellitus) ([place])   Decubitus ulcer of buttock, stage 4 ([place])  76 y.o. female with paraplegia and multiple sclerosis who presents with sepsis likely source of the decubitus sacral ulcer.  She is tachycardic, mildly hypotensive, and with leukocytosis but currently afebrile, without lactic acidosis, mentating well, and with adequate perfusion after volume resuscitation.  We will continue broad-spectrum antibiotics and supportive care, and consult wound and surgery for assistance managing wound.  #Sepsis #Decubitis Ulcer of Right Buttock Systemic infection improving with normalized WBC, remains afebrile, stable mild tachycardia and mild hypotension. -Continue Vanc and Zosyn -f/u blood cultures -Volume resuscitation as needed -WOC consulted -check prealbumin -nutrition consult -General surgery  consulted, discussed with Will Creig Hines PA-C of [facility]  #Hypokalemia #Hypomagnesemia -replete K and Mg -follow electrolytes  #Anemia Microcytic, down from prior.  Ferritin elevated in setting of acute infection. -Transfuse to >7.0  #Multiple Sclerosis On chronic physiologic dose prednisone (alternating 5 mg and 2.5 mg), would not expect HPA axis suppression. -Hold tecfidara -Continue Prednisone -Continue baclofen and gabapentin -If persistent hypotension consider stress dosing steroids  #DM On glimepride 2 mg daily and Lantus 10U qAM at home. -Hold glimepride -SSI  #HTN -Hold home amlodipine and valsartan  #Depression -Continue Cymbalta  Fluids: NS w/ 20 mEq KCl @ 50 mL/hr for 1 day Diet: carb DVT Prophylaxis: lovenox Code Status: DNR  Dispo: Anticipated discharge in approximately 2-3 day(s).   Minus [place], MD 11/23/2016, 6:33 AM Pager: [phone]

## 2016-11-23 NOTE — Progress Notes (Signed)
CRITICAL VALUE ALERT  Critical Value: K+2.5  Date & Time Notied: 11/23/2016, 0430  Provider Notified: Wynetta Emery MD   Orders Received/Actions taken:

## 2016-11-23 NOTE — Progress Notes (Signed)
CM received call from [facility] rep, andrea who states she is in contact with family and patient and pt will resume with [facility] services upon discharge. No other CM needs were communicated.

## 2016-11-24 LAB — CBC
HCT: 25.6 % — ABNORMAL LOW (ref 36.0–46.0)
Hemoglobin: 7.7 g/dL — ABNORMAL LOW (ref 12.0–15.0)
MCH: 23.5 pg — ABNORMAL LOW (ref 26.0–34.0)
MCHC: 30.1 g/dL (ref 30.0–36.0)
MCV: 78 fL (ref 78.0–100.0)
Platelets: 567 10*3/uL — ABNORMAL HIGH (ref 150–400)
RBC: 3.28 MIL/uL — ABNORMAL LOW (ref 3.87–5.11)
RDW: 17.8 % — ABNORMAL HIGH (ref 11.5–15.5)
WBC: 10.2 10*3/uL (ref 4.0–10.5)

## 2016-11-24 LAB — BASIC METABOLIC PANEL
Anion gap: 7 (ref 5–15)
BUN: 5 mg/dL — ABNORMAL LOW (ref 6–20)
CALCIUM: 8.5 mg/dL — AB (ref 8.9–10.3)
CO2: 33 mmol/L — ABNORMAL HIGH (ref 22–32)
CREATININE: 0.57 mg/dL (ref 0.44–1.00)
Chloride: 100 mmol/L — ABNORMAL LOW (ref 101–111)
GFR calc Af Amer: >60 mL/min (ref >60)
GFR calc non Af Amer: >60 mL/min (ref >60)
Glucose, Bld: 172 mg/dL — ABNORMAL HIGH (ref 65–99)
Potassium: 3.9 mmol/L (ref 3.5–5.1)
Sodium: 140 mmol/L (ref 135–145)

## 2016-11-24 LAB — GLUCOSE, CAPILLARY
GLUCOSE-CAPILLARY: 141 mg/dL — AB (ref 65–99)
Glucose-Capillary: 249 mg/dL — ABNORMAL HIGH (ref 65–99)
Glucose-Capillary: 219 mg/dL — ABNORMAL HIGH (ref 65–99)
Glucose-Capillary: 171 mg/dL — ABNORMAL HIGH (ref 65–99)

## 2016-11-24 LAB — URINE CULTURE

## 2016-11-24 LAB — PREALBUMIN: Prealbumin: 6.1 mg/dL — ABNORMAL LOW (ref 18–38)

## 2016-11-24 LAB — MAGNESIUM: MAGNESIUM: 2 mg/dL (ref 1.7–2.4)

## 2016-11-24 NOTE — Progress Notes (Addendum)
Subjective: She has no new complaints this morning. No fevers or chills overnight. Her worst pain is from muscle spasms but feels they are not significantly worse than usual.  Objective:  Vital signs in last 24 hours: Vitals:   11/23/16 1200 11/23/16 1330 11/23/16 1409 11/24/16 0500  BP: 127/70 (!) 132/91 (!) 105/59 (!) 96/47  Pulse: (!) 111 (!) 120 (!) 110 (!) 102  Resp: 17 (!) 22 20 18   Temp:  97.7 F (36.5 C) 98.1 F (36.7 C) 98 F (36.7 C)  TempSrc:  Oral Oral Oral  SpO2: 100% 95% 96% 95%  Weight:      Height:       Physical Exam  Constitutional: She is oriented to person, place, and time. She appears well-developed and well-nourished. No distress.  Cardiovascular: Regular rhythm.   No murmur heard. Tachycardic  Pulmonary/Chest: Effort normal. No respiratory distress.  Musculoskeletal: She exhibits no edema.  Neurological: She is alert and oriented to person, place, and time.  Skin: Skin is warm and dry. No pallor.  Deep wound on right superior buttock, appears mostly clean with small drainage  Psychiatric: She has a normal mood and affect. Her behavior is normal.   Assessment/Plan:  Principal Problem:   Sepsis ([place]) Active Problems:   Multiple sclerosis (HCC)   HTN (hypertension)   DM (diabetes mellitus) ([place])   Decubitus ulcer of buttock, stage 4 ([place])  76 y.o. female with paraplegia and multiple sclerosis who presents with sepsis likely source of the decubitus sacral ulcer.  She is tachycardic, mildly hypotensive, and with leukocytosis but currently afebrile, without lactic acidosis, mentating well, and with adequate perfusion after volume resuscitation.  We will continue broad-spectrum antibiotics and supportive care, and consult wound and surgery for assistance managing wound.  #Sepsis #Decubitis Ulcer of Right Buttock She remains afebrile and leukocytosis resolved although tachycardia with some hypotension persist. Her wound itself appears less purulent  than on arrival. It is unclear how much is from the broad parenteral antibiotics versus more aggressive would care and cleaning. Blood cultures remain NGTD. Surgery recommendations are appreciated, intensive wound care and antibiotics without additional imaging at this time. We will continue to follow closely and supplemental with IV fluids and electrolytes as needed. For now continue vancomycin/zosyn. We will plan to switch to an empiric oral regimen such as Bactrim, considering recent doxy treatment, in another 1-2 days if her blood cultures remain negative and is clinically progressing. Her albumin and prealbumin are low suggesting poor nutrition which is a contributory to her ulcer and will slow recovery.  Encouraged her to eat more and supplements as well.  #Hypokalemia #Hypomagnesemia Improved after replacement for the past 2 days.  #Anemia This is acute on chronic microcytic anemia. Probably multifactorial with chronic disease as well. Ferritin elevated in setting of acute infection. Goal to maintain >7.  #Multiple Sclerosis On chronic physiologic dose prednisone (alternating 5 mg and 2.5 mg). We will consider stress dose steroids if her hypotension becomes refractory to IVF or more symptomatic. -Hold tecfidara -Continue Prednisone -Continue baclofen and gabapentin -If persistent hypotension consider stress dosing steroids  #DM On glimepride 2 mg daily and Lantus 10U qAM at home. We are holding home glimepride for now. CBGs 100s-200s over pat 24 hours we will continue to monitor and recommend Glucerna supplements. -SSI  #HTN -Hold home amlodipine and valsartan for hypotension  #Depression -Continue Cymbalta  Dispo: Anticipated discharge in approximately 1-2 day(s).   Collier Salina, MD PGY-II Internal Medicine Resident  Pager# [phone] 11/24/2016, 11:20 AM

## 2016-11-25 LAB — BASIC METABOLIC PANEL
Anion gap: 9 (ref 5–15)
BUN: 7 mg/dL (ref 6–20)
CALCIUM: 8.7 mg/dL — AB (ref 8.9–10.3)
CO2: 34 mmol/L — ABNORMAL HIGH (ref 22–32)
Chloride: 100 mmol/L — ABNORMAL LOW (ref 101–111)
Creatinine, Ser: 0.51 mg/dL (ref 0.44–1.00)
GFR calc non Af Amer: >60 mL/min (ref >60)
Glucose, Bld: 130 mg/dL — ABNORMAL HIGH (ref 65–99)
Potassium: 3.5 mmol/L (ref 3.5–5.1)
Sodium: 143 mmol/L (ref 135–145)

## 2016-11-25 LAB — GLUCOSE, CAPILLARY
GLUCOSE-CAPILLARY: 127 mg/dL — AB (ref 65–99)
Glucose-Capillary: 111 mg/dL — ABNORMAL HIGH (ref 65–99)
Glucose-Capillary: 267 mg/dL — ABNORMAL HIGH (ref 65–99)
Glucose-Capillary: 198 mg/dL — ABNORMAL HIGH (ref 65–99)

## 2016-11-25 LAB — CBC
HCT: 26.7 % — ABNORMAL LOW (ref 36.0–46.0)
Hemoglobin: 8.1 g/dL — ABNORMAL LOW (ref 12.0–15.0)
MCH: 24 pg — AB (ref 26.0–34.0)
MCHC: 30.3 g/dL (ref 30.0–36.0)
MCV: 79.2 fL (ref 78.0–100.0)
PLATELETS: 607 10*3/uL — AB (ref 150–400)
RBC: 3.37 MIL/uL — ABNORMAL LOW (ref 3.87–5.11)
RDW: 18.1 % — AB (ref 11.5–15.5)
WBC: 9.6 10*3/uL (ref 4.0–10.5)

## 2016-11-25 LAB — MAGNESIUM: MAGNESIUM: 1.6 mg/dL — AB (ref 1.7–2.4)

## 2016-11-25 MED ORDER — MAGNESIUM SULFATE 2 GM/50ML IV SOLN
2.0000 g | Freq: Once | INTRAVENOUS | Status: AC
  Administered 2016-11-25: 2 g via INTRAVENOUS
  Filled 2016-11-25: qty 50

## 2016-11-25 MED ORDER — POTASSIUM CHLORIDE CRYS ER 20 MEQ PO TBCR
40.0000 meq | EXTENDED_RELEASE_TABLET | Freq: Once | ORAL | Status: AC
  Administered 2016-11-25: 40 meq via ORAL
  Filled 2016-11-25: qty 2

## 2016-11-25 MED ORDER — POTASSIUM CHLORIDE CRYS ER 20 MEQ PO TBCR: 40.0000 meq | EXTENDED_RELEASE_TABLET | Freq: Once | ORAL | Status: DC

## 2016-11-25 MED ORDER — DIMETHYL FUMARATE 240 MG PO CPDR
240.0000 mg | DELAYED_RELEASE_CAPSULE | Freq: Two times a day (BID) | ORAL | Status: DC
  Administered 2016-11-25 – 2016-11-26 (×3): 240 mg via ORAL
  Filled 2016-11-25 (×3): qty 1

## 2016-11-25 NOTE — Progress Notes (Signed)
Subjective: No acute events overnight, feels well today, just about back at baseline.  No fevers, chills, nausea, vomiting, or other symptoms.  Objective:  Vital signs in last 24 hours: Vitals:   11/23/16 1409 11/24/16 0500 11/24/16 1543 11/24/16 2147  BP: (!) 105/59 (!) 96/47 110/60 123/71  Pulse: (!) 110 (!) 102 (!) 102 (!) 102  Resp: 20 18 18 16   Temp: 98.1 F (36.7 C) 98 F (36.7 C)  98.5 F (36.9 C)  TempSrc: Oral Oral  Oral  SpO2: 96% 95% 98% 97%  Weight:      Height:       Physical Exam  Constitutional: She is oriented to person, place, and time.  Lying in bed eating breakfast Alert, more energetic appearing than on admission  Cardiovascular: Regular rhythm.   Tachycardic  Pulmonary/Chest: Effort normal. No respiratory distress.  Neurological: She is alert and oriented to person, place, and time.  Skin: Skin is warm and dry.  Right buttock wound with clean packing, healthy pink granulation tissue, no purulence  Psychiatric: She has a normal mood and affect. Her behavior is normal.   CBC Latest Ref Rng & Units 11/25/2016 11/24/2016 11/23/2016  WBC 4.0 - 10.5 K/uL 9.6 10.2 10.1  Hemoglobin 12.0 - 15.0 g/dL 8.1(L) 7.7(L) 8.3(L)  Hematocrit 36.0 - 46.0 % 26.7(L) 25.6(L) 26.5(L)  Platelets 150 - 400 K/uL 607(H) 567(H) 577(H)   CMP Latest Ref Rng & Units 11/25/2016 11/24/2016 11/23/2016  Glucose 65 - 99 mg/dL 130(H) 172(H) 167(H)  BUN 6 - 20 mg/dL 7 5(L) <5(L)  Creatinine 0.44 - 1.00 mg/dL 0.51 0.57 0.65  Sodium 135 - 145 mmol/L 143 140 139  Potassium 3.5 - 5.1 mmol/L 3.5 3.9 2.5(LL)  Chloride 101 - 111 mmol/L 100(L) 100(L) 100(L)  CO2 22 - 32 mmol/L 34(H) 33(H) 29  Calcium 8.9 - 10.3 mg/dL 8.7(L) 8.5(L) 8.0(L)  Total Protein 6.5 - 8.1 g/dL - - -  Total Bilirubin 0.3 - 1.2 mg/dL - - -  Alkaline Phos 38 - 126 U/L - - -  AST 15 - 41 U/L - - -  ALT 14 - 54 U/L - - -   Blood Cultures 11/22/2016 No growth 2 days  Assessment/Plan:  Principal Problem:   Sepsis  ([place]) Active Problems:   Multiple sclerosis (HCC)   HTN (hypertension)   DM (diabetes mellitus) (HCC)   Decubitus ulcer of buttock, stage 4 ([place])  76 y.o. female with paraplegia and multiple sclerosis who presents with sepsis likely source of the decubitus sacral ulcer.  She is tachycardic, mildly hypotensive, and with leukocytosis but currently afebrile, without lactic acidosis, mentating well, and with adequate perfusion after volume resuscitation.  We will continue broad-spectrum antibiotics and supportive care, and consult wound and surgery for assistance managing wound.  #Sepsis #Decubitis Ulcer of Right Buttock Systemic infection resolved with normalized WBC, remains afebrile, stable mild tachycardia and mild hypotension.  Wound is less purulent, getting hydrotherapy and dressing/packing. -Continue Vanc and Zosyn today, plan to transition to orals tomorrow -f/u blood cultures -appreciate surgery, WOC, and nutrition assistance for buttock and R heel wounds  #Hypokalemia #Hypomagnesemia -replete K and Mg -follow electrolytes  #Anemia Microcytic, down from prior.  Ferritin elevated in setting of acute infection. -Transfuse to >7.0  #Multiple Sclerosis On chronic physiologic dose prednisone (alternating 5 mg and 2.5 mg), would not expect HPA axis suppression. -Hold tecfidara -Continue Prednisone -Continue baclofen and gabapentin -If persistent hypotension consider stress dosing steroids  #DM On glimepride 2 mg  daily and Lantus 10U qAM at home. -Hold glimepride -SSI  #HTN -Hold home amlodipine and valsartan  #Depression -Continue Cymbalta  Fluids: none Diet: carb DVT Prophylaxis: lovenox Code Status: DNR  Dispo: Anticipated discharge in approximately 1-2 day(s).   Minus Liberty, MD 11/25/2016, 6:24 AM Pager: [phone]

## 2016-11-25 NOTE — Progress Notes (Signed)
  Date: 11/25/2016  Patient name: Laura Roberts  Medical record number: [id number]  Date of birth: 06/04/1941   I have seen and evaluated this patient and I have discussed the plan of care with the house staff. Please see their note for complete details. I concur with their findings with the following additions/corrections: Continue Hydro therapy. Possible DC back to SNF tomorrow on oral antibiotics.  Bartholomew Crews, MD 11/25/2016, 2:13 PM

## 2016-11-25 NOTE — Progress Notes (Signed)
Physical Therapy Wound Treatment Patient Details  Name: Laura Roberts MRN: [id number] Date of Birth: January 11, 1941  Today's Date: 11/25/2016 Time: 1364-3837 Time Calculation (min): 40 min  Subjective  Subjective: t Patient and Family Stated Goals: get this wound healed up so I can go to [facility] only 3 day/week Date of Onset:  (>1 month) Prior Treatments: Dressing changes at [facility]  Pain Score: Pain Score: 3   Wound Assessment  Wound / Incision (Open or Dehisced) 11/22/16 Diabetic ulcer Buttocks Right Stage 4 Decubitus Ulcer on Right buttocks  (Active)  Dressing Type Gauze (Comment);Moist to dry 11/25/2016 10:05 AM  Dressing Changed Changed 11/25/2016 10:05 AM  Dressing Status Clean;Dry;Intact 11/25/2016 10:05 AM  Dressing Change Frequency Twice a day 11/25/2016 10:05 AM  Site / Wound Assessment Red;Pink 11/25/2016 10:05 AM  % Wound base Black/Eschar 0% 11/25/2016 10:05 AM  % Wound base Other/Granulation Tissue (Comment) 0% 11/25/2016 10:05 AM  Peri-wound Assessment Intact 11/25/2016 10:05 AM  Wound Length (cm) 5 cm 11/23/2016  3:00 PM  Wound Width (cm) 4 cm 11/23/2016  3:00 PM  Wound Depth (cm) 5 cm 11/23/2016  3:00 PM  Margins Unattached edges (unapproximated) 11/25/2016 10:05 AM  Drainage Amount Scant 11/25/2016 10:05 AM  Drainage Description Serosanguineous 11/25/2016 10:05 AM  Treatment Hydrotherapy (Pulse lavage);Packing (Saline gauze) 11/25/2016 10:05 AM   Hydrotherapy Pulsed lavage therapy - wound location: R buttock Pulsed Lavage with Suction (psi): 4 psi (4-8) Pulsed Lavage with Suction - Normal Saline Used: 1000 mL Pulsed Lavage Tip: Tip with splash shield   Wound Assessment and Plan  Wound Therapy - Assess/Plan/Recommendations Wound Therapy - Clinical Statement: Continue to cleanse wound and promote removal of necrotic tissue. May benefit from [facility]. Wound Therapy - Functional Problem List: MS/para, immobility and extended time in the w/c has probably and will affect this wound. Factors  Delaying/Impairing Wound Healing: Altered sensation;Diabetes Mellitus;Incontinence;Infection - systemic/local;Immobility;Multiple medical problems Hydrotherapy Plan: Debridement;Patient/family education;Pulsatile lavage with suction;Dressing change Wound Therapy - Frequency: 6X / week Wound Therapy - Current Recommendations: Case manager/social work;WOC nurse Wound Therapy - Follow Up Recommendations: Other (comment) ([facility]ACE nursing for continued wound care.) Wound Plan: see above  Wound Therapy Goals- Improve the function of patient's integumentary system by progressing the wound(s) through the phases of wound healing (inflammation - proliferation - remodeling) by: Decrease Necrotic Tissue to: 0% Decrease Necrotic Tissue - Progress: Progressing toward goal Increase Granulation Tissue to: 100% Increase Granulation Tissue - Progress: Progressing toward goal Improve Drainage Characteristics: Min;Serous Improve Drainage Characteristics - Progress: Progressing toward goal Goals/treatment plan/discharge plan were made with and agreed upon by patient/family: Yes Time For Goal Achievement: 7 days Wound Therapy - Potential for Goals: Good  Goals will be updated until maximal potential achieved or discharge criteria met.  Discharge criteria: when goals achieved, discharge from hospital, MD decision/surgical intervention, no progress towards goals, refusal/missing three consecutive treatments without notification or medical reason.  GP     Shary Decamp [place] 11/25/2016, 10:11 AM Suanne Marker PT [phone]

## 2016-11-25 NOTE — Progress Notes (Signed)
Pharmacy Antibiotic Note  Laura Roberts is a 76 y.o. female admitted on 11/22/2016 with wound infection.  She is now on D#4 vancomycin and zosyn. WBC 18.2 >> 9.6, afebrile. Renal function stable. Scr 0.51 today, est. crcl ~ 60 ml/min. Per MD note, continue vanc/zosyn today, likely switching to oral abx tomorrow.   Plan: Continue Vancomycin 750mg  IV every 12 hours.  Will not check level since likely d/c tomorrow and renal fx has been stable. Zosyn 3.375g IV q8h (4 hour infusion). F/U clinical progrssion and LOT  Height: 5\' 2"  (157.5 cm) Weight: 180 lb 4.8 oz (81.8 kg) IBW/kg (Calculated) : 50.1  Temp (24hrs), Avg:98.4 F (36.9 C), Min:98.2 F (36.8 C), Max:98.5 F (36.9 C)   Recent Labs Lab 11/22/16 1536 11/22/16 1554 11/23/16 0251 11/24/16 0227 11/25/16 0712  WBC 18.2*  --  10.1 10.2 9.6  CREATININE 0.87  --  0.65 0.57 0.51  LATICACIDVEN  --  1.32  --   --   --     Estimated Creatinine Clearance: 61.2 mL/min (by C-G formula based on SCr of 0.51 mg/dL).    Allergies  Allergen Reactions  . Codeine Other (See Comments)    Difficult breathing and skin problem  . Ultram [Tramadol] Other (See Comments)    Difficult breathing and skin peeling  . Januvia [Sitagliptin] Rash    Blisters    Antimicrobials this admission: 5/25 Vancomycin>> 5/25 Zosyn>>  Dose adjustments this admission:   Microbiology results: 5/25 blood cx: ngtd 5/25 mrsa pcr: neg 5/25 urine cx: recollecting  Thank you for allowing pharmacy to be a part of this patient's care.  Maryanna Shape, PharmD, BCPS  Clinical Pharmacist  Pager: [phone]   11/25/2016 12:53 PM

## 2016-11-26 DIAGNOSIS — Z885 Allergy status to narcotic agent status: Secondary | ICD-10-CM

## 2016-11-26 DIAGNOSIS — Z794 Long term (current) use of insulin: Secondary | ICD-10-CM

## 2016-11-26 DIAGNOSIS — A419 Sepsis, unspecified organism: Principal | ICD-10-CM

## 2016-11-26 DIAGNOSIS — E119 Type 2 diabetes mellitus without complications: Secondary | ICD-10-CM

## 2016-11-26 DIAGNOSIS — Z888 Allergy status to other drugs, medicaments and biological substances status: Secondary | ICD-10-CM

## 2016-11-26 DIAGNOSIS — I10 Essential (primary) hypertension: Secondary | ICD-10-CM

## 2016-11-26 DIAGNOSIS — L89314 Pressure ulcer of right buttock, stage 4: Secondary | ICD-10-CM

## 2016-11-26 DIAGNOSIS — G35 Multiple sclerosis: Secondary | ICD-10-CM

## 2016-11-26 DIAGNOSIS — Z79899 Other long term (current) drug therapy: Secondary | ICD-10-CM

## 2016-11-26 LAB — BASIC METABOLIC PANEL
ANION GAP: 7 (ref 5–15)
BUN: 11 mg/dL (ref 6–20)
CHLORIDE: 99 mmol/L — AB (ref 101–111)
CO2: 32 mmol/L (ref 22–32)
Calcium: 8.4 mg/dL — ABNORMAL LOW (ref 8.9–10.3)
Creatinine, Ser: 0.62 mg/dL (ref 0.44–1.00)
GFR calc Af Amer: >60 mL/min (ref >60)
GFR calc non Af Amer: >60 mL/min (ref >60)
GLUCOSE: 213 mg/dL — AB (ref 65–99)
POTASSIUM: 4.2 mmol/L (ref 3.5–5.1)
Sodium: 138 mmol/L (ref 135–145)

## 2016-11-26 LAB — CBC
HCT: 27.5 % — ABNORMAL LOW (ref 36.0–46.0)
Hemoglobin: 8.3 g/dL — ABNORMAL LOW (ref 12.0–15.0)
MCH: 24.3 pg — AB (ref 26.0–34.0)
MCHC: 30.2 g/dL (ref 30.0–36.0)
MCV: 80.6 fL (ref 78.0–100.0)
PLATELETS: 592 10*3/uL — AB (ref 150–400)
RBC: 3.41 MIL/uL — AB (ref 3.87–5.11)
RDW: 19.2 % — ABNORMAL HIGH (ref 11.5–15.5)
WBC: 12 10*3/uL — ABNORMAL HIGH (ref 4.0–10.5)

## 2016-11-26 LAB — GLUCOSE, CAPILLARY
GLUCOSE-CAPILLARY: 258 mg/dL — AB (ref 65–99)
Glucose-Capillary: 160 mg/dL — ABNORMAL HIGH (ref 65–99)
Glucose-Capillary: 195 mg/dL — ABNORMAL HIGH (ref 65–99)

## 2016-11-26 LAB — MAGNESIUM: Magnesium: 1.8 mg/dL (ref 1.7–2.4)

## 2016-11-26 MED ORDER — PRO-STAT SUGAR FREE PO LIQD: 30.0000 mL | Freq: Two times a day (BID) | ORAL | 0 refills | Status: DC

## 2016-11-26 MED ORDER — MAGNESIUM SULFATE IN D5W 1-5 GM/100ML-% IV SOLN
1.0000 g | Freq: Once | INTRAVENOUS | Status: AC
  Administered 2016-11-26: 1 g via INTRAVENOUS
  Filled 2016-11-26: qty 100

## 2016-11-26 MED ORDER — SULFAMETHOXAZOLE-TRIMETHOPRIM 800-160 MG PO TABS: 1.0000 | ORAL_TABLET | Freq: Two times a day (BID) | ORAL | 0 refills | Status: AC

## 2016-11-26 MED ORDER — GABAPENTIN 300 MG PO CAPS: 300.0000 mg | ORAL_CAPSULE | Freq: Three times a day (TID) | ORAL | 0 refills | Status: DC

## 2016-11-26 MED ORDER — SULFAMETHOXAZOLE-TRIMETHOPRIM 800-160 MG PO TABS
1.0000 | ORAL_TABLET | Freq: Two times a day (BID) | ORAL | Status: DC
  Administered 2016-11-26: 1 via ORAL
  Filled 2016-11-26: qty 1

## 2016-11-26 MED ORDER — GLUCERNA SHAKE PO LIQD: 237.0000 mL | Freq: Two times a day (BID) | ORAL | 0 refills | Status: AC

## 2016-11-26 NOTE — Progress Notes (Signed)
Internal Medicine Attending:   I saw and examined the patient. I reviewed the resident's note and I agree with the resident's findings and plan as documented in the resident's note.  Patient feels well today with no new complaints. She does note some fatigue but believe this is secondary to restarting her multiple sclerosis medication (Tecfidera). Patient was initially admitted with sepsis likely secondary to her sacral decubitus ulcer. She is now afebrile, normotensive with a normal heart rate and only mild leukocytosis. She was initially started on Zosyn and vancomycin and improved. We will transition her to oral Bactrim twice daily to complete a 14 day course of antibiotics. Blood cultures remain no growth to date. We will continue to follow up. Wound care follow-up and recommendations appreciated. Patient is stable for discharge to SNF today.

## 2016-11-26 NOTE — Progress Notes (Signed)
Report called to [facility] 

## 2016-11-26 NOTE — Progress Notes (Addendum)
Clinical Social Worker facilitated patient discharge including contacting patient family and facility to confirm patient discharge plans.  Clinical information faxed to facility and family agreeable with plan.  CSW arranged ambulance transport via [facility] to [place] .  RN to call [phone] (pt will be in rm #[phone]) report prior to discharge.  Clinical Social Worker will sign off for now as social work intervention is no longer needed. Please consult [place] again if new need arises.  Rhea Pink, MSW, [place]

## 2016-11-26 NOTE — Clinical Social Work Placement (Signed)
   CLINICAL SOCIAL WORK PLACEMENT  NOTE  Date:  11/26/2016  Patient Details  Name: Laura Roberts MRN: [id number] Date of Birth: [birth year]  Clinical Social Work is seeking post-discharge placement for this patient at the [place] level of care (*CSW will initial, date and re-position this form in  chart as items are completed):  Yes   Patient/family provided with [facility] Work Department's list of facilities offering this level of care within the geographic area requested by the patient (or if unable, by the patient's family).  Yes   Patient/family informed of their freedom to choose among providers that offer the needed level of care, that participate in Medicare, Medicaid or managed care program needed by the patient, have an available bed and are willing to accept the patient.  Yes   Patient/family informed of [facility]'s ownership interest in [facility] and [facility], as well as of the fact that they are under no obligation to receive care at these facilities.  PASRR submitted to EDS on       PASRR number received on       Existing PASRR number confirmed on 11/26/16     FL2 transmitted to all facilities in geographic area requested by pt/family on       FL2 transmitted to all facilities within larger geographic area on       Patient informed that his/her managed care company has contracts with or will negotiate with certain facilities, including the following:        Yes   Patient/family informed of bed offers received.  Patient chooses bed at [facility] recommends and patient chooses bed at      Patient to be transferred to [facility] on 11/26/16.  Patient to be transferred to facility by [facility]     Patient family notified on 11/26/16 of transfer.  Name of family member notified:  Jaslin Novitski     PHYSICIAN Please sign FL2, Please prepare prescriptions     Additional  Comment:    _______________________________________________ Wende Neighbors, LCSW 11/26/2016, 3:49 PM

## 2016-11-26 NOTE — Progress Notes (Signed)
Subjective: Acute events overnight. Feels tired today, which she thinks is due to restarting her Tecfidera.  Overall feels well close to her baseline, feels like this hospitalization was "a godsend". She is amenable to placement at rehabilitation facility.  Discussed on the phone with her pace doctor, Dr. Bradd Burner.  Encouraged SNF placement for wound care and decompression of her buttock ulcer.  Objective:  Vital signs in last 24 hours: Vitals:   11/25/16 1516 11/25/16 2159 11/26/16 0600 11/26/16 1312  BP: 116/62 121/65 101/61 118/62  Pulse: (!) 104 99 96 100  Resp: 18 16 18 18   Temp:  98.7 F (37.1 C) 98.5 F (36.9 C) 98.7 F (37.1 C)  TempSrc:  Oral Oral Oral  SpO2: 100% 95% 97% 96%  Weight:      Height:       Physical Exam  Constitutional: She is oriented to person, place, and time.  Lying in bed eating breakfast In no distress  Cardiovascular: Regular rhythm.   Tachycardic  Pulmonary/Chest: Effort normal. No respiratory distress.  Neurological: She is alert and oriented to person, place, and time.  Skin: Skin is warm and dry.  Right buttock wound is deep, with no purulence, white fibrotic base, circumferential pink granulation tissue, some undermining  Psychiatric: She has a normal mood and affect. Her behavior is normal.   CBC Latest Ref Rng & Units 11/26/2016 11/25/2016 11/24/2016  WBC 4.0 - 10.5 K/uL 12.0(H) 9.6 10.2  Hemoglobin 12.0 - 15.0 g/dL 8.3(L) 8.1(L) 7.7(L)  Hematocrit 36.0 - 46.0 % 27.5(L) 26.7(L) 25.6(L)  Platelets 150 - 400 K/uL 592(H) 607(H) 567(H)   CMP Latest Ref Rng & Units 11/26/2016 11/25/2016 11/24/2016  Glucose 65 - 99 mg/dL 213(H) 130(H) 172(H)  BUN 6 - 20 mg/dL 11 7 5(L)  Creatinine 0.44 - 1.00 mg/dL 0.62 0.51 0.57  Sodium 135 - 145 mmol/L 138 143 140  Potassium 3.5 - 5.1 mmol/L 4.2 3.5 3.9  Chloride 101 - 111 mmol/L 99(L) 100(L) 100(L)  CO2 22 - 32 mmol/L 32 34(H) 33(H)  Calcium 8.9 - 10.3 mg/dL 8.4(L) 8.7(L) 8.5(L)  Total Protein 6.5 - 8.1  g/dL - - -  Total Bilirubin 0.3 - 1.2 mg/dL - - -  Alkaline Phos 38 - 126 U/L - - -  AST 15 - 41 U/L - - -  ALT 14 - 54 U/L - - -   Blood Cultures 11/22/2016 No growth 3 days  Assessment/Plan:  Principal Problem:   Sepsis ([place]) Active Problems:   Multiple sclerosis (HCC)   HTN (hypertension)   DM (diabetes mellitus) (HCC)   Decubitus ulcer of buttock, stage 4 ([place])  76 y.o. female with paraplegia and multiple sclerosis who presents with sepsis likely source of the decubitus sacral ulcer.  She is tachycardic, mildly hypotensive, and with leukocytosis but currently afebrile, without lactic acidosis, mentating well, and with adequate perfusion after volume resuscitation.  We will continue broad-spectrum antibiotics and supportive care, and consult wound and surgery for assistance managing wound.  #Sepsis #Decubitis Ulcer of Right Buttock Systemic infection resolved with normalized WBC, remains afebrile, stable mild tachycardia and mild hypotension.  Wound is less purulent, getting hydrotherapy and dressing/packing. -Discontinue Vanc and Zosyn -Start Bactrim DS twice a day to complete 14 days antibiosis (to end on May 8) -f/u blood cultures -appreciate surgery, WOC, and nutrition assistance for buttock and R heel wounds  #Hypokalemia #Hypomagnesemia Resolved. -follow electrolytes -Replete as needed  #Anemia Microcytic, down from prior.  Ferritin elevated in setting of  acute infection. -Transfuse to >7.0  #Multiple Sclerosis On chronic physiologic dose prednisone (alternating 5 mg and 2.5 mg), would not expect HPA axis suppression. -Resume Tecfidara -Continue Prednisone -Continue baclofen and gabapentin  #DM On glimepride 2 mg daily and Lantus 10U qAM at home. -Hold glimepride -SSI  #HTN -Hold home amlodipine and valsartan  #Depression -Continue Cymbalta  Fluids: none Diet: carb DVT Prophylaxis: lovenox Code Status: DNR  Dispo: Anticipated discharge in  approximately 0-1 day(s).   Minus [place], MD 11/26/2016, 2:33 PM Pager: [phone]

## 2016-11-26 NOTE — Care Management Important Message (Signed)
Important Message  Patient Details  Name: Laura Roberts MRN: [id number] Date of Birth: 11/26/1940   Medicare Important Message Given:  Yes    Nathen May 11/26/2016, 12:04 PM

## 2016-11-26 NOTE — Discharge Summary (Signed)
Name: Laura Roberts MRN: [id number] DOB: 08/10/1940 76 y.o. PCP: Laura Adie, MD  Date of Admission: 11/22/2016  2:04 PM Date of Discharge: 11/26/2016 Attending Physician: Laura Contes, MD  Discharge Diagnosis:  Principal Problem:   Sepsis [facility]) Active Problems:   Multiple sclerosis ([place])   HTN (hypertension)   DM (diabetes mellitus) ([place])   Decubitus ulcer of buttock, stage 4 ([place])   Discharge Medications: Allergies as of 11/26/2016      Reactions   Codeine Other (See Comments)   Difficult breathing and skin problem   Ultram [tramadol] Other (See Comments)   Difficult breathing and skin peeling   Januvia [sitagliptin] Rash   Blisters      Medication List    STOP taking these medications   doxycycline 100 MG tablet Commonly known as:  VIBRA-TABS     TAKE these medications   acetaminophen 325 MG tablet Commonly known as:  TYLENOL Take 325 mg by mouth 2 (two) times daily.   amLODipine 5 MG tablet Commonly known as:  NORVASC Take 10 mg by mouth at bedtime.   ARTIFICIAL TEAR OP Apply 1 drop to eye 4 (four) times daily as needed (dry eyes).   baclofen 10 MG tablet Commonly known as:  LIORESAL 1.5 tablet in the morning, 1 tablet midday, two tablets at night   calcium citrate 950 MG tablet Commonly known as:  CALCITRATE - dosed in mg elemental calcium Take 200 mg of elemental calcium by mouth daily.   cholecalciferol 1000 units tablet Commonly known as:  VITAMIN D Take 1,000 Units by mouth daily.   Dimethyl Fumarate 240 MG Cpdr Commonly known as:  TECFIDERA Take 1 capsule (240 mg total) by mouth 2 (two) times daily.   DULoxetine 60 MG capsule Commonly known as:  CYMBALTA Take 90 mg by mouth daily. Take along with a 30mg    feeding supplement (GLUCERNA SHAKE) Liqd Take 237 mLs by mouth 2 (two) times daily between meals. Start taking on:  11/27/2016   feeding supplement (PRO-STAT SUGAR FREE 64) Liqd Take 30 mLs by mouth 2 (two) times  daily.   gabapentin 300 MG capsule Commonly known as:  NEURONTIN Take 1 capsule (300 mg total) by mouth 3 (three) times daily.   glimepiride 2 MG tablet Commonly known as:  AMARYL Take 2 mg by mouth daily.   insulin glargine 100 UNIT/ML injection Commonly known as:  LANTUS Inject 10 Units into the skin at bedtime.   lidocaine 5 % Commonly known as:  LIDODERM Place 1 patch onto the skin daily as needed (pain). Remove & Discard patch within 12 hours or as directed by MD   omeprazole 20 MG capsule Commonly known as:  PRILOSEC Take 20 mg by mouth daily.   polyethylene glycol packet Commonly known as:  MIRALAX / GLYCOLAX Take 17 g by mouth daily as needed for mild constipation.   predniSONE 5 MG tablet Commonly known as:  DELTASONE 1 tablet on odd days, one half tablet on even days   SENSI-CARE PROTECTIVE BARRIER EX Apply 1 application topically daily as needed (skin).   sulfamethoxazole-trimethoprim 800-160 MG tablet Commonly known as:  BACTRIM DS,SEPTRA DS Take 1 tablet by mouth every 12 (twelve) hours.   valsartan 320 MG tablet Commonly known as:  DIOVAN Take 320 mg by mouth at bedtime.       Disposition and follow-up:   Laura Roberts was discharged from [facility] in Stable condition.  At the hospital follow up  visit please address:  1.  Decubitus Ulcer of Right Buttock.  Wound care as instructed with twice daily wet-to-dry dressing changes and packing.  Contact [facility] to coordinate for plastic surgery in June. Oral Bactrim for 10 days twice daily to complete antibiotic course.  2. HTN.  Please manage antihypertensives and resume as blood pressure tolerates.  3. DM.  Resume insulin and oral hypoglycemics and manage as indicated.  4.  Labs / imaging needed at time of follow-up: none  5.  Pending labs/ test needing follow-up: none  Follow-up Appointments: Contact information for after-discharge care    Destination    [facility] .   Specialty:  [place] information: [address] [place] Course by problem list: Principal Problem:   Sepsis [facility]) Active Problems:   Multiple sclerosis ([place])   HTN (hypertension)   DM (diabetes mellitus) ([place])   Decubitus ulcer of buttock, stage 4 ([place])   1. Decubitus Ulcer of Right Buttock and Concern for Sepsis Laura Roberts is a 76 year old woman with paraplegia and multiple sclerosis who presented with 1 week of fatigue and chills, and purulent exudate from a decubitus ulcer on her right buttock. She failed outpatient treatment with doxycycline and daily wound care at [facility] prior to admission.  At admission she was tachycardic, mildly hypotensive, and with leukocytosis though afebrile.  She has a deep wound on her right buttock about 4 x 5 cm which is purulent exudate. She was given volume resuscitation and treated with broad-spectrum IV antibiotics with good response. Her blood cultures remained negative with no growth at 4 days at time of discharge. She felt better, her hemodynamics improved, and the purulence of her wound resolved. Surgery was consulted for wound care recommendations she was started on hydrotherapy and twice daily wet-to-dry dressing changes.  Dietitian recommended nutritional supplementation to combat malnutrition and facilitate wound healing. Her blood cultures remained negative, and after discussion with the patient and her [facility] physician she was discharged to SNF for further wound care and unloading her buttock with oral Bactrim to complete 14 days of antibiosis for skin and soft tissue infection.  2. Multiple Sclerosis She was continued on her home dose prednisone, and Tecfidera was initially held with concern for immunosuppression context of a bacterial infection. After 2 days of IV antibiotics with good clinical response, her Tecfidera was reduced resumed.  She was continued on her prior to admission baclofen and gabapentin for spasticity and pain.  3. HTN Home amlodipine and valsartan were held with concern for hypotension and her blood pressures were mainly normotensive.  4. DM Her home hypoglycemics were held and she was managed with sliding scale corrective insulin and her CBGs were in the 100s to 200s.   Discharge Vitals:   BP 118/62 (BP Location: Right Arm)   Pulse 100   Temp 98.7 F (37.1 C) (Oral)   Resp 18   Ht 5\' 2"  (1.575 m)   Wt 180 lb 4.8 oz (81.8 kg)   SpO2 96%   BMI 32.98 kg/m   Pertinent Labs, Studies, and Procedures:   CBC Latest Ref Rng & Units 11/26/2016 11/25/2016 11/24/2016  WBC 4.0 - 10.5 K/uL 12.0(H) 9.6 10.2  Hemoglobin 12.0 - 15.0 g/dL 8.3(L) 8.1(L) 7.7(L)  Hematocrit 36.0 - 46.0 % 27.5(L) 26.7(L) 25.6(L)  Platelets 150 - 400 K/uL 592(H) 607(H) 567(H)   CMP  Latest Ref Rng & Units 11/26/2016 11/25/2016 11/24/2016  Glucose 65 - 99 mg/dL 213(H) 130(H) 172(H)  BUN 6 - 20 mg/dL 11 7 5(L)  Creatinine 0.44 - 1.00 mg/dL 0.62 0.51 0.57  Sodium 135 - 145 mmol/L 138 143 140  Potassium 3.5 - 5.1 mmol/L 4.2 3.5 3.9  Chloride 101 - 111 mmol/L 99(L) 100(L) 100(L)  CO2 22 - 32 mmol/L 32 34(H) 33(H)  Calcium 8.9 - 10.3 mg/dL 8.4(L) 8.7(L) 8.5(L)  Total Protein 6.5 - 8.1 g/dL - - -  Total Bilirubin 0.3 - 1.2 mg/dL - - -  Alkaline Phos 38 - 126 U/L - - -  AST 15 - 41 U/L - - -  ALT 14 - 54 U/L - - -   Blood Cultures 11/22/2016 No growth 4 days  Component     Latest Ref Rng & Units 11/24/2016  PREALBUMIN     18 - 38 mg/dL 6.1 (L)   Discharge Instructions: Discharge Instructions    Diet - low sodium heart healthy    Complete by:  As directed    Increase activity slowly    Complete by:  As directed      Wet-to-dry dressings with Kerlix/normal saline twice a day on right buttock ulcer. Right foot using xeroform gauze twice daily and securing with Kerlix roll gauze.  Bilateral pressure redistribution heel  boots. Mattress replacement for pressure redistribution, frequent repositioning, unload right buttock.  Please contact [facility] for further care and coordination.  Signed: Minus Liberty, MD 11/26/2016, 3:27 PM   Pager: [phone]

## 2016-11-26 NOTE — Progress Notes (Signed)
Went to change patient and foley was out. Clean patietn and will reinsert foley soon.

## 2016-11-26 NOTE — Progress Notes (Signed)
Physical Therapy Wound Treatment Patient Details  Name: ZILAH VILLAFLOR MRN: [id number] Date of Birth: August 13, 1940  Today's Date: 11/26/2016 Time: 1006-1026 Time Calculation (min): 20 min  Subjective  Subjective: "I don't feel anything." Patient and Family Stated Goals: get this wound healed up so I can go to [facility] only 3 day/week Date of Onset:  (>1 month) Prior Treatments: Dressing changes at [facility]  Pain Score:  0/10 denies pain  Wound Assessment  Pressure Injury 11/22/16 Stage II -  Partial thickness loss of dermis presenting as a shallow open ulcer with a red, pink wound bed without slough. Stage 11 on right heel with drainage (Active)  Dressing Type Foam 11/25/2016  7:45 PM  Dressing Clean;Dry;Intact 11/25/2016  7:45 PM  Dressing Change Frequency PRN 11/25/2016  7:45 PM  State of Healing Non-healing 11/25/2016  8:15 AM  Wound Length (cm) 2 cm 11/23/2016  8:00 AM  Wound Width (cm) 2 cm 11/23/2016  8:00 AM  Margins Unattached edges (unapproximated) 11/25/2016  8:15 AM  Drainage Amount None 11/25/2016  7:45 PM  Drainage Description Purulent 11/25/2016  8:15 AM  Treatment Cleansed 11/24/2016 10:20 PM     Wound / Incision (Open or Dehisced) 11/22/16 Diabetic ulcer Buttocks Right Stage 4 Decubitus Ulcer on Right buttocks  (Active)  Dressing Type Gauze (Comment);Moist to dry 11/26/2016 10:58 AM  Dressing Changed Changed 11/26/2016 10:58 AM  Dressing Status Clean;Dry;Intact 11/26/2016 10:58 AM  Dressing Change Frequency Twice a day 11/26/2016 10:58 AM  Site / Wound Assessment Red;Pink 11/26/2016 10:58 AM  % Wound base Red or Granulating 100% 11/25/2016 11:00 PM  % Wound base Yellow/Fibrinous Exudate 0% 11/25/2016 11:00 PM  % Wound base Black/Eschar 0% 11/26/2016 10:58 AM  % Wound base Other/Granulation Tissue (Comment) 0% 11/26/2016 10:58 AM  Peri-wound Assessment Intact 11/26/2016 10:58 AM  Wound Length (cm) 5 cm 11/23/2016  3:00 PM  Wound Width (cm) 4 cm 11/23/2016  3:00 PM  Wound Depth (cm) 5 cm  11/23/2016  3:00 PM  Margins Unattached edges (unapproximated) 11/26/2016 10:58 AM  Closure None 11/26/2016 10:58 AM  Drainage Amount Scant 11/26/2016 10:58 AM  Drainage Description Serosanguineous 11/26/2016 10:58 AM  Treatment Cleansed;Debridement (Selective);Packing (Saline gauze);Hydrotherapy (Pulse lavage) 11/26/2016 10:58 AM   Hydrotherapy Pulsed lavage therapy - wound location: R buttock Pulsed Lavage with Suction (psi): 8 psi (4-8) Pulsed Lavage with Suction - Normal Saline Used: 1000 mL Pulsed Lavage Tip: Tip with splash shield Selective Debridement Selective Debridement - Location: R buttock Selective Debridement - Tools Used: Scissors;Forceps Selective Debridement - Tissue Removed: necrotic tissue--deep   Wound Assessment and Plan  Wound Therapy - Assess/Plan/Recommendations Wound Therapy - Clinical Statement: Pt continues to tolerate hydrotherapy well.  The wound bed remains clean with deep necrotic tissue.  Pt continues to progress toward wound vac placement.  Undermining remains as well.   Wound Therapy - Functional Problem List: MS/para, immobility and extended time in the w/c has probably and will affect this wound. Factors Delaying/Impairing Wound Healing: Altered sensation;Diabetes Mellitus;Incontinence;Infection - systemic/local;Immobility;Multiple medical problems Hydrotherapy Plan: Debridement;Patient/family education;Pulsatile lavage with suction;Dressing change Wound Therapy - Frequency: 6X / week Wound Therapy - Current Recommendations: Case manager/social work;WOC nurse Wound Therapy - Follow Up Recommendations: Other (comment) ([facility] nursing for continued wound care.) Wound Plan: see above  Wound Therapy Goals- Improve the function of patient's integumentary system by progressing the wound(s) through the phases of wound healing (inflammation - proliferation - remodeling) by: Decrease Necrotic Tissue to: 0% Decrease Necrotic Tissue - Progress: Progressing toward  goal Increase Granulation Tissue to: 100% Increase Granulation Tissue - Progress: Progressing toward goal Improve Drainage Characteristics: Min;Serous Improve Drainage Characteristics - Progress: Progressing toward goal Goals/treatment plan/discharge plan were made with and agreed upon by patient/family: Yes Time For Goal Achievement: 7 days Wound Therapy - Potential for Goals: Good  Goals will be updated until maximal potential achieved or discharge criteria met.  Discharge criteria: when goals achieved, discharge from hospital, MD decision/surgical intervention, no progress towards goals, refusal/missing three consecutive treatments without notification or medical reason.  GP     Noella Kipnis Eli Hose 11/26/2016, 11:03 AM Governor Rooks, PTA pager [phone]

## 2016-11-26 NOTE — Evaluation (Signed)
Occupational Therapy Evaluation Patient Details Name: Laura Roberts MRN: [id number] DOB: 09-18-1940 Today's Date: 11/26/2016    History of Present Illness 76 y.o. female with recent fall (aide dropped her) with R proximal tib/fib fx and L avulsion of tibial tuberosity both treated non-operatively with knee immobilizers 08/30/16, HTN, DM, Bilateral carpal tunnel syndrome, spinal stenosis, arthritis, B TKA, back surgery,  paraplegia and multiple sclerosis who presents with sepsis likely source of the decubitus sacral ulcer.   Clinical Impression   This 76 yo female admitted with above presents to acute OT with deficits below (see OT problem list) thus affecting her ability to do more for herself. She will benefit from acute OT with follow up OT at SNF to work on UB strengthening and sitting balance.    Follow Up Recommendations  SNF;Supervision/Assistance - 24 hour    Equipment Recommendations  None recommended by OT       Precautions / Restrictions Precautions Precautions: Fall Restrictions Weight Bearing Restrictions: No      Mobility Bed Mobility Overal bed mobility: Needs Assistance Bed Mobility: Supine to Sit;Sit to Supine     Supine to sit: HOB elevated;Max assist;Total assist Sit to supine: +2 for physical assistance;Total assist   General bed mobility comments: total A to advance BLEs, max A to raise trunk (pt 40%); +2 total assist for sit to supine  Transfers                 General transfer comment: did not attempt, pt left in bed for hydrotherapy    Balance Overall balance assessment: Needs assistance Sitting-balance support: Feet supported;Single extremity supported Sitting balance-Leahy Scale: Poor Sitting balance - Comments: intermittent min A for static sitting due to posterior lean, able to maintain neutral intermittently; sat on EOB x 6 minutes Postural control: Posterior lean                                 ADL either performed or  assessed with clinical judgement   ADL                                         General ADL Comments: total care; pt can do her own grooming     Vision Patient Visual Report: No change from baseline              Pertinent Vitals/Pain Pain Assessment: Faces Faces Pain Scale: Hurts a little bit Pain Location: LUE (shoulder with some movement) Pain Descriptors / Indicators: Sore Pain Intervention(s): Limited activity within patient's tolerance;Monitored during session;Repositioned     Hand Dominance Right   Extremity/Trunk Assessment Upper Extremity Assessment Upper Extremity Assessment: RUE deficits/detail;LUE deficits/detail RUE Deficits / Details: WFL with noted arthritic changes in hand LUE Deficits / Details: Elbow distally WFL; shoulder can do horizontal abduction/adduction (in supine and with elbow bent with increased time); Shoulder flexion with A and then working on place and hold at 90 degrees    Lower Extremity Assessment Lower Extremity Assessment: RLE deficits/detail;LLE deficits/detail (no active movement BLEs, sensation to light touch absent B thighs, calves, feet -this is baseline)   Encouraged her to continue to do her shoulder horizontal exercises in bed       Communication Communication Communication: No difficulties   Cognition Arousal/Alertness: Awake/alert Behavior During Therapy: WFL for tasks assessed/performed Overall Cognitive Status: Within  Functional Limits for tasks assessed                                                Home Living Family/patient expects to be discharged to:: Skilled nursing facility Living Arrangements: Alone Available Help at Discharge: Family;Personal care attendant (AM  to get her ready for day and PM to get her ready for bed) Type of Home: House Home Access: Ramped entrance     Home Layout: One level     Bathroom Shower/Tub: Teacher, early years/pre: Handicapped  height     Home Equipment: Wheelchair - power;Other (comment) (hoyer lift)   Additional Comments: aides come daily--does bathing, dressing, toileting; pt reports that prior to breaking both legs in Feb she was able to A with transfers and not use hoyer lift      Prior Functioning/Environment Level of Independence: Needs assistance  Gait / Transfers Assistance Needed: aide uses hoyer to transfer pt to [facility] daily, pt stated she can normally sit on EOB and maintain sitting balance, has been non ambulatory x 3 years ADL's / Homemaking Assistance Needed: aide assists with bathing/dressing daily and toileting. Pt does all of her basic ADLs from W/C level            OT Problem List: Decreased strength;Decreased range of motion;Impaired balance (sitting and/or standing);Impaired UE functional use;Pain      OT Treatment/Interventions: Therapeutic exercise    OT Goals(Current goals can be found in the care plan section) Acute Rehab OT Goals Patient Stated Goal: return [facility] day program at [facility] and increase strength in Bil UEs OT Goal Formulation: With patient Time For Goal Achievement: 12/10/16 Potential to Achieve Goals: Good  OT Frequency: Min 2X/week   Barriers to D/C: Decreased caregiver support             AM-PAC PT "6 Clicks" Daily Activity     Outcome Measure Help from another person eating meals?: A Little Help from another person taking care of personal grooming?: A Little Help from another person toileting, which includes using toliet, bedpan, or urinal?: Total Help from another person bathing (including washing, rinsing, drying)?: Total Help from another person to put on and taking off regular upper body clothing?: Total Help from another person to put on and taking off regular lower body clothing?: Total 6 Click Score: 10   End of Session    Activity Tolerance: Patient tolerated treatment well Patient left: in bed;with call bell/phone within reach  OT Visit Diagnosis:  Muscle weakness (generalized) (M62.81)                Time: 4196-2229 OT Time Calculation (min): 21 min Charges:  OT General Charges $OT Visit: 1 Procedure OT Evaluation $OT Eval Moderate Complexity: 1 Procedure Golden Circle, OTR/L [phone] 11/26/2016

## 2016-11-26 NOTE — Clinical Social Work Note (Signed)
Clinical Social Work Assessment  Patient Details  Name: Laura Roberts MRN: [id number] Date of Birth: December 27, 1940  Date of referral:  11/26/16               Reason for consult:  Discharge Planning                Permission sought to share information with:  Family Supports Permission granted to share information::  Yes, Verbal Permission Granted  Name::     Savanna Dooley  Agency::  [facility]  Relationship::  son  Contact Information:  [phone]  Housing/Transportation Living arrangements for the past 2 months:  [place] of Information:  Patient Patient Interpreter Needed:  None Criminal Activity/Legal Involvement Pertinent to Current Situation/Hospitalization:  No - Comment as needed Significant Relationships:  None Lives with:  Self Do you feel safe going back to the place where you live?  No Need for family participation in patient care:  Yes (Comment)  Care giving concerns:  Patient lives at home and has emotional support from her two adult sons ut patient stated they both live out of town. Patient stated she is being followed at [place] and has support from them   Social Worker assessment / plan:  Clinical Social Worker met patient at the bedside to offer support and discuss discharge plan. Patient is agreeable to discharge to SNF and has chosen [facility]. [facility] is aware of patients choice. Patient stated she has some anxiety about discharging to SNF because " she's not sure if facility will be ale to take care of her wounds". CSW has encouraged patient that she will have her [facility] Education officer, museum following her who will advocate for her. CSW has asked patient MD to speak to patient to help her with anxiety. Helene Kelp is ware of patients discharge.  Employment status:  Retired Forensic scientist:  Other (Comment Required) ([facility]) PT Recommendations:  [place] / Referral to community resources:   [place]  Patient/Family's Response to care:  Patient is agreeable to discharge to SNF  Patient/Family's Understanding of and Emotional Response to Diagnosis, Current Treatment, and Prognosis:  Patient is aware that she needs assistance before being bale to discharge back home  Emotional Assessment Appearance:  Appears stated age Attitude/Demeanor/Rapport:  Other Affect (typically observed):  Sad Orientation:  Oriented to  Time, Oriented to Place, Oriented to Self, Oriented to Situation Alcohol / Substance use:  Not Applicable Psych involvement (Current and /or in the community):  No (Comment)  Discharge Needs  Concerns to be addressed:  No discharge needs identified Readmission within the last 30 days:    Current discharge risk:  None Barriers to Discharge:  No Barriers Identified   Wende Neighbors, [place] 11/26/2016, 3:40 PM

## 2016-11-26 NOTE — Discharge Instructions (Signed)
Wet-to-dry dressings with Kerlix/normal saline twice a day on right buttock ulcer. Right foot using xeroform gauze twice daily and securing with Kerlix roll gauze.  Bilateral pressure redistribution heel boots. Mattress replacement for pressure redistribution, frequent repositioning, unload right buttock.  Please contact [facility] for further care and coordination.

## 2016-11-26 NOTE — Evaluation (Signed)
Physical Therapy Evaluation Patient Details Name: Laura Roberts MRN: [id number] DOB: 1940/08/14 Today's Date: 11/26/2016   History of Present Illness  76 y.o. female with recent fall (aide dropped her) with R proximal tib/fib fx and L avulsion of tibial tuberosity both treated non-operatively with knee immobilizers 08/30/16, HTN, DM, Bilateral carpal tunnel syndrome, spinal stenosis, arthritis, B TKA, back surgery,  paraplegia and multiple sclerosis who presents with sepsis likely source of the decubitus sacral ulcer.  Clinical Impression  Pt admitted with above diagnosis. Pt currently with functional limitations due to the deficits listed below (see PT Problem List). Max to total assist for bed mobility, pt sat on edge of bed x 6 min with intermittent min assist for balance. At baseline her aides use Harrel Lemon to transfer her to power WC. Pt reports she has needed level of assistance from her aides and plans to DC home. She stated she can [facility].  Pt will benefit from skilled PT to increase their independence and safety with mobility to allow discharge to the venue listed below.       Follow Up Recommendations Home health PT    Equipment Recommendations  None recommended by PT    Recommendations for Other Services       Precautions / Restrictions Precautions Precautions: Fall Restrictions Weight Bearing Restrictions: No      Mobility  Bed Mobility Overal bed mobility: Needs Assistance Bed Mobility: Supine to Sit;Sit to Supine     Supine to sit: HOB elevated;Max assist;Total assist Sit to supine: +2 for physical assistance;Total assist   General bed mobility comments: total A to advance BLEs, max A to raise trunk (pt 40%); +2 total assist for sit to supine  Transfers                 General transfer comment: did not attempt, pt left in bed for hydrotherapy  Ambulation/Gait                Stairs            Wheelchair Mobility     Modified Rankin (Stroke Patients Only)       Balance Overall balance assessment: Needs assistance Sitting-balance support: Feet supported;Single extremity supported Sitting balance-Leahy Scale: Poor Sitting balance - Comments: intermittent min A for static sitting due to posterior lean, able to maintain neutral intermittently; sat on EOB x 6 minutes Postural control: Posterior lean                                   Pertinent Vitals/Pain Pain Assessment: No/denies pain    Home Living Family/patient expects to be discharged to:: Private residence Living Arrangements: Alone Available Help at Discharge: Family;Personal care attendant Type of Home: House Home Access: Ramped entrance     Home Layout: One level Home Equipment: Wheelchair - power;Other (comment) Harrel Lemon) Additional Comments: aides come daily    Prior Function Level of Independence: Needs assistance   Gait / Transfers Assistance Needed: aide uses hoyer to transfer pt to [facility] daily, pt stated she can normally sit on EOB and maintain sitting balance, has been non ambulatory x 3 years  ADL's / Homemaking Assistance Needed: aide assists with bathing/dressing daily        Hand Dominance   Dominant Hand: Right    Extremity/Trunk Assessment   Upper Extremity Assessment Upper Extremity Assessment: Defer to OT evaluation (pain L shoulder with  attempted elevation, pt reports h/o rotator cuff problems/arthritis; RUE grossly WFL)    Lower Extremity Assessment Lower Extremity Assessment: RLE deficits/detail;LLE deficits/detail (no active movement BLEs, sensation to light touch absent B thighs, calves, feet -this is baseline)       Communication   Communication: No difficulties  Cognition Arousal/Alertness: Awake/alert Behavior During Therapy: WFL for tasks assessed/performed Overall Cognitive Status: Within Functional Limits for tasks assessed                                         General Comments      Exercises     Assessment/Plan    PT Assessment Patient needs continued PT services  PT Problem List Decreased skin integrity;Decreased balance;Decreased activity tolerance;Decreased mobility       PT Treatment Interventions Functional mobility training;Balance training;Therapeutic exercise;Therapeutic activities;Patient/family education    PT Goals (Current goals can be found in the Care Plan section)  Acute Rehab PT Goals Patient Stated Goal: return to day program at [facility] PT Goal Formulation: With patient Time For Goal Achievement: 12/10/16 Potential to Achieve Goals: Good    Frequency Min 3X/week   Barriers to discharge        Co-evaluation               AM-PAC PT "6 Clicks" Daily Activity  Outcome Measure Difficulty turning over in bed (including adjusting bedclothes, sheets and blankets)?: A Lot Difficulty moving from lying on back to sitting on the side of the bed? : Total Difficulty sitting down on and standing up from a chair with arms (e.g., wheelchair, bedside commode, etc,.)?: Total Help needed moving to and from a bed to chair (including a wheelchair)?: Total Help needed walking in hospital room?: Total Help needed climbing 3-5 steps with a railing? : Total 6 Click Score: 7    End of Session   Activity Tolerance: Patient tolerated treatment well Patient left: in bed;with call bell/phone within reach;Other (comment) (hydrotherapy team in room) Nurse Communication: Mobility status;Need for lift equipment PT Visit Diagnosis: Muscle weakness (generalized) (M62.81)    Time: 0940-1006 PT Time Calculation (min) (ACUTE ONLY): 26 min   Charges:   PT Evaluation $PT Eval High Complexity: 1 Procedure PT Treatments $Therapeutic Activity: 8-22 mins   PT G Codes:          Philomena Doheny 11/26/2016, 10:26 AM [phone]

## 2016-11-26 NOTE — Progress Notes (Addendum)
Inpatient Diabetes Program Recommendations  AACE/ADA: New Consensus Statement on Inpatient Glycemic Control (2015)  Target Ranges:  Prepandial:   less than 140 mg/dL      Peak postprandial:   less than 180 mg/dL (1-2 hours)      Critically ill patients:  140 - 180 mg/dL   Lab Results  Component Value Date   GLUCAP 258 (H) 11/26/2016   HGBA1C 7.6 (H) 07/21/2012    Review of Glycemic Control Results for Laura Roberts, Laura Roberts (MRN [id number]) as of 11/26/2016 11:38  Ref. Range 11/25/2016 12:01 11/25/2016 17:55 11/25/2016 21:58 11/26/2016 06:24 11/26/2016 10:59  Glucose-Capillary Latest Ref Range: 65 - 99 mg/dL 267 (H) 198 (H) 127 (H) 160 (H) 258 (H)   Diabetes history: DM2 Outpatient Diabetes medications: Lantus 10 units + Amaryl 2 mg qd Current orders for Inpatient glycemic control: Novolog correction 0-15 units tid  Inpatient Diabetes Program Recommendations:  Noted postprandial CBGs elevated.  Please consider: -A1c -Lantus 5-10 units daily -Novolog 3 units MC tid if eats 50% meals  Thank you, Nani Gasser. Kahleb Mcclane, RN, MSN, CDE  Diabetes Coordinator Inpatient Glycemic Control Team Team Pager [phone] (8am-5pm) 11/26/2016 11:41 AM

## 2016-11-26 NOTE — NC FL2 (Signed)
[place] LEVEL OF CARE SCREENING TOOL     IDENTIFICATION  Patient Name: Laura Roberts Birthdate: 1941-01-07 Sex: female Admission Date (Current Location): 11/22/2016  [facility] and [place] Number:  Herbalist and Address:  [facility], [place] [address], [place], [place] [address]      Provider Number: [id number]  Attending Physician Name and Address:  [name], MD  Relative Name and Phone Number:       Current Level of Care: Hospital Recommended Level of Care: [place] Prior Approval Number:    Date Approved/Denied:   PASRR Number: [id number]  Discharge Plan: SNF    Current Diagnoses: Patient Active Problem List   Diagnosis Date Noted  . Decubitus ulcer of buttock, stage 4 ([place]) 11/22/2016  . Sepsis ([place]) 11/22/2016  . Leukocytosis   . Paraplegia ([place]) 06/01/2014  . Closed supracondylar fracture of right femur, periprosthetic 06/01/2014  . Femoral fracture ([place]) 06/01/2014  . Encounter for therapeutic drug monitoring 01/05/2014  . Foot drop, bilateral 10/13/2012  . Abnormality of gait 10/13/2012  . Neurogenic bladder 09/02/2012  . Neurogenic pain of lower extremity 09/02/2012  . Weakness 07/21/2012  . HTN (hypertension) 07/21/2012  . DM (diabetes mellitus) ([place]) 07/21/2012  . HLD (hyperlipidemia) 07/21/2012  . Tachycardia 07/21/2012  . GERD (gastroesophageal reflux disease) 07/21/2012  . Multiple sclerosis (HCC)     Orientation RESPIRATION BLADDER Height & Weight     Self, Time, Situation, Place  Normal Continent Weight: 180 lb 4.8 oz (81.8 kg) Height:  5\' 2"  (157.5 cm)  BEHAVIORAL SYMPTOMS/MOOD NEUROLOGICAL BOWEL NUTRITION STATUS      Incontinent Diet (carb modified)  AMBULATORY STATUS COMMUNICATION OF NEEDS Skin   Extensive Assist Verbally Normal                       Personal Care Assistance Level of Assistance  Bathing, Feeding, Dressing Bathing Assistance: Limited  assistance Feeding assistance: Independent Dressing Assistance: Limited assistance     Functional Limitations Info  Sight, Hearing, Speech Sight Info: Adequate Hearing Info: Adequate Speech Info: Adequate    SPECIAL CARE FACTORS FREQUENCY  PT (By licensed PT), OT (By licensed OT)     PT Frequency: 5x wk OT Frequency: 5x wk            Contractures Contractures Info: Not present    Additional Factors Info  Code Status, Allergies Code Status Info: DNR Allergies Info: CODEINE, ULTRAM TRAMADOL, JANUVIA SITAGLIPTIN           Current Medications (11/26/2016):  This is the current hospital active medication list Current Facility-Administered Medications  Medication Dose Route Frequency Provider Last Rate Last Dose  . acetaminophen (TYLENOL) tablet 650 mg  650 mg Oral Q6H PRN Milagros Loll, MD   650 mg at 11/24/16 2127   Or  . acetaminophen (TYLENOL) suppository 650 mg  650 mg Rectal Q6H PRN Milagros Loll, MD      . baclofen (LIORESAL) tablet 10-20 mg  10-20 mg Oral TID PRN Milagros Loll, MD   20 mg at 11/25/16 1750  . Dimethyl Fumarate CPDR 240 mg  240 mg Oral BID Minus Liberty, MD   240 mg at 11/26/16 0943  . DULoxetine (CYMBALTA) DR capsule 90 mg  90 mg Oral Daily Milagros Loll, MD   90 mg at 11/26/16 0943  . enoxaparin (LOVENOX) injection 40 mg  40 mg Subcutaneous Q24H Milagros Loll, MD  40 mg at 11/25/16 2120  . feeding supplement (GLUCERNA SHAKE) (GLUCERNA SHAKE) liquid 237 mL  237 mL Oral BID BM Bartholomew Crews, MD   237 mL at 11/26/16 1400  . feeding supplement (PRO-STAT SUGAR FREE 64) liquid 30 mL  30 mL Oral BID Bartholomew Crews, MD   30 mL at 11/26/16 0942  . gabapentin (NEURONTIN) capsule 300 mg  300 mg Oral TID Bartholomew Crews, MD   300 mg at 11/26/16 0943  . insulin aspart (novoLOG) injection 0-15 Units  0-15 Units Subcutaneous TID WC Milagros Loll, MD   8 Units at 11/26/16 1305  . lidocaine (LIDODERM) 5 % 1 patch  1  patch Transdermal Daily PRN Milagros Loll, MD   1 patch at 11/24/16 2127  . pantoprazole (PROTONIX) EC tablet 40 mg  40 mg Oral Daily Milagros Loll, MD   40 mg at 11/26/16 0943  . piperacillin-tazobactam (ZOSYN) IVPB 3.375 g  3.375 g Intravenous Rosalin Hawking, MD 12.5 mL/hr at 11/26/16 1306 3.375 g at 11/26/16 1306  . polyethylene glycol (MIRALAX / GLYCOLAX) packet 17 g  17 g Oral Daily PRN Jacques Earthly T, MD      . predniSONE (DELTASONE) tablet 2.5 mg  2.5 mg Oral Murrell Redden, MD   2.5 mg at 11/25/16 1610  . promethazine (PHENERGAN) tablet 12.5 mg  12.5 mg Oral Q6H PRN Jacques Earthly T, MD      . sodium chloride flush (NS) 0.9 % injection 3 mL  3 mL Intravenous Q12H Milagros Loll, MD   3 mL at 11/25/16 2119  . sulfamethoxazole-trimethoprim (BACTRIM DS,SEPTRA DS) 800-160 MG per tablet 1 tablet  1 tablet Oral Q12H Minus Liberty, MD         Discharge Medications: Please see discharge summary for a list of discharge medications.  Relevant Imaging Results:  Relevant Lab Results:   Additional Information SS#[id number]  Wende Neighbors, LCSW

## 2016-11-27 LAB — CULTURE, BLOOD (ROUTINE X 2)
CULTURE: NO GROWTH
CULTURE: NO GROWTH
SPECIAL REQUESTS: ADEQUATE
Special Requests: ADEQUATE

## 2016-11-29 LAB — GLUCOSE, CAPILLARY
GLUCOSE-CAPILLARY: 178 mg/dL — AB (ref 65–99)
GLUCOSE-CAPILLARY: 165 mg/dL — AB (ref 65–99)
GLUCOSE-CAPILLARY: 265 mg/dL — AB (ref 65–99)

## 2016-12-02 ENCOUNTER — Ambulatory Visit: Payer: Medicare (Managed Care) | Admitting: Adult Health

## 2016-12-02 DIAGNOSIS — L89154 Pressure ulcer of sacral region, stage 4: Secondary | ICD-10-CM | POA: Diagnosis not present

## 2016-12-02 DIAGNOSIS — L89313 Pressure ulcer of right buttock, stage 3: Secondary | ICD-10-CM | POA: Diagnosis not present

## 2016-12-09 ENCOUNTER — Encounter (HOSPITAL_BASED_OUTPATIENT_CLINIC_OR_DEPARTMENT_OTHER): Payer: Medicare (Managed Care) | Attending: Internal Medicine

## 2016-12-09 ENCOUNTER — Telehealth: Payer: Self-pay | Admitting: Neurology

## 2016-12-09 DIAGNOSIS — L89314 Pressure ulcer of right buttock, stage 4: Secondary | ICD-10-CM | POA: Diagnosis not present

## 2016-12-09 DIAGNOSIS — I10 Essential (primary) hypertension: Secondary | ICD-10-CM | POA: Insufficient documentation

## 2016-12-09 DIAGNOSIS — Z96653 Presence of artificial knee joint, bilateral: Secondary | ICD-10-CM | POA: Insufficient documentation

## 2016-12-09 DIAGNOSIS — G35 Multiple sclerosis: Secondary | ICD-10-CM | POA: Diagnosis not present

## 2016-12-09 DIAGNOSIS — G8220 Paraplegia, unspecified: Secondary | ICD-10-CM | POA: Diagnosis not present

## 2016-12-09 DIAGNOSIS — L89613 Pressure ulcer of right heel, stage 3: Secondary | ICD-10-CM | POA: Insufficient documentation

## 2016-12-09 NOTE — Telephone Encounter (Signed)
   Dr. Bradd Burner called about the patient. She has been admitted to an extended care facility for treatment of a sacral decubitus ulcer. The patient is on Tecfidera for her multiple sclerosis, she has a paraparesis.  Patient will continue to be followed through this office. She may be a candidate for a Ocrevus in the future. The duration of her stay at the extended care facility is unknown.

## 2016-12-11 ENCOUNTER — Other Ambulatory Visit: Payer: Self-pay | Admitting: Family Medicine

## 2016-12-11 DIAGNOSIS — M869 Osteomyelitis, unspecified: Secondary | ICD-10-CM

## 2016-12-12 ENCOUNTER — Other Ambulatory Visit (HOSPITAL_COMMUNITY): Payer: Self-pay | Admitting: Family Medicine

## 2016-12-12 ENCOUNTER — Other Ambulatory Visit: Payer: Self-pay | Admitting: Family Medicine

## 2016-12-12 DIAGNOSIS — S91301D Unspecified open wound, right foot, subsequent encounter: Secondary | ICD-10-CM

## 2016-12-12 DIAGNOSIS — S91301A Unspecified open wound, right foot, initial encounter: Secondary | ICD-10-CM

## 2016-12-16 ENCOUNTER — Other Ambulatory Visit: Payer: Medicare (Managed Care)

## 2016-12-16 DIAGNOSIS — L89314 Pressure ulcer of right buttock, stage 4: Secondary | ICD-10-CM | POA: Diagnosis not present

## 2016-12-17 ENCOUNTER — Other Ambulatory Visit: Payer: Medicare (Managed Care)

## 2016-12-18 ENCOUNTER — Encounter (HOSPITAL_COMMUNITY): Payer: Medicare (Managed Care)

## 2016-12-23 ENCOUNTER — Ambulatory Visit (HOSPITAL_COMMUNITY)
Admission: RE | Admit: 2016-12-23 | Discharge: 2016-12-23 | Disposition: A | Discharge status: Home / Self Care | Payer: Medicare (Managed Care) | Source: Ambulatory Visit | Attending: Family Medicine | Admitting: Family Medicine

## 2016-12-23 ENCOUNTER — Ambulatory Visit (HOSPITAL_BASED_OUTPATIENT_CLINIC_OR_DEPARTMENT_OTHER)
Admission: RE | Admit: 2016-12-23 | Discharge: 2016-12-23 | Disposition: A | Discharge status: Home / Self Care | Payer: Medicare (Managed Care) | Source: Ambulatory Visit | Attending: Family Medicine | Admitting: Family Medicine

## 2016-12-23 DIAGNOSIS — G8220 Paraplegia, unspecified: Secondary | ICD-10-CM | POA: Insufficient documentation

## 2016-12-23 DIAGNOSIS — S91301D Unspecified open wound, right foot, subsequent encounter: Secondary | ICD-10-CM | POA: Diagnosis not present

## 2016-12-23 DIAGNOSIS — E11622 Type 2 diabetes mellitus with other skin ulcer: Secondary | ICD-10-CM | POA: Diagnosis not present

## 2016-12-23 DIAGNOSIS — L98499 Non-pressure chronic ulcer of skin of other sites with unspecified severity: Secondary | ICD-10-CM | POA: Insufficient documentation

## 2016-12-23 DIAGNOSIS — M869 Osteomyelitis, unspecified: Secondary | ICD-10-CM

## 2016-12-23 DIAGNOSIS — L97409 Non-pressure chronic ulcer of unspecified heel and midfoot with unspecified severity: Secondary | ICD-10-CM | POA: Insufficient documentation

## 2016-12-23 DIAGNOSIS — N319 Neuromuscular dysfunction of bladder, unspecified: Secondary | ICD-10-CM | POA: Diagnosis present

## 2016-12-23 DIAGNOSIS — G35 Multiple sclerosis: Secondary | ICD-10-CM | POA: Diagnosis not present

## 2016-12-23 DIAGNOSIS — L89159 Pressure ulcer of sacral region, unspecified stage: Secondary | ICD-10-CM | POA: Diagnosis not present

## 2016-12-23 MED ORDER — GADOBENATE DIMEGLUMINE 529 MG/ML IV SOLN
20.0000 mL | Freq: Once | INTRAVENOUS | Status: AC
  Administered 2016-12-23: 20 mL via INTRAVENOUS

## 2016-12-23 NOTE — Progress Notes (Signed)
VASCULAR LAB PRELIMINARY  ARTERIAL  ABI completed:ABIs are within normal limits at rest.    RIGHT    LEFT    PRESSURE WAVEFORM  PRESSURE WAVEFORM  BRACHIAL 127 T BRACHIAL 129 T  DP   DP    AT 157 B AT 178 T  PT 134 B PT 160 T  PER   PER    GREAT TOE  NA GREAT TOE  NA    RIGHT LEFT  ABI 1.22 1.38     Kaliah Haddaway, RVT 12/23/2016, 2:27 PM

## 2016-12-26 ENCOUNTER — Other Ambulatory Visit: Payer: Medicare (Managed Care)

## 2016-12-26 ENCOUNTER — Other Ambulatory Visit (HOSPITAL_COMMUNITY)
Admission: RE | Admit: 2016-12-26 | Discharge: 2016-12-26 | Disposition: A | Discharge status: Home / Self Care | Payer: Medicare (Managed Care) | Source: Other Acute Inpatient Hospital | Attending: Internal Medicine | Admitting: Internal Medicine

## 2016-12-26 DIAGNOSIS — L89314 Pressure ulcer of right buttock, stage 4: Secondary | ICD-10-CM | POA: Insufficient documentation

## 2016-12-29 LAB — AEROBIC CULTURE  (SUPERFICIAL SPECIMEN): GRAM STAIN: NONE SEEN

## 2016-12-29 LAB — AEROBIC CULTURE W GRAM STAIN (SUPERFICIAL SPECIMEN): Culture: NORMAL

## 2017-01-02 ENCOUNTER — Encounter (HOSPITAL_BASED_OUTPATIENT_CLINIC_OR_DEPARTMENT_OTHER): Payer: Medicare (Managed Care) | Attending: Internal Medicine

## 2017-01-02 DIAGNOSIS — L89613 Pressure ulcer of right heel, stage 3: Secondary | ICD-10-CM | POA: Insufficient documentation

## 2017-01-02 DIAGNOSIS — G8220 Paraplegia, unspecified: Secondary | ICD-10-CM | POA: Insufficient documentation

## 2017-01-02 DIAGNOSIS — I10 Essential (primary) hypertension: Secondary | ICD-10-CM | POA: Diagnosis not present

## 2017-01-02 DIAGNOSIS — Z96653 Presence of artificial knee joint, bilateral: Secondary | ICD-10-CM | POA: Diagnosis not present

## 2017-01-02 DIAGNOSIS — G35 Multiple sclerosis: Secondary | ICD-10-CM | POA: Diagnosis not present

## 2017-01-02 DIAGNOSIS — E1140 Type 2 diabetes mellitus with diabetic neuropathy, unspecified: Secondary | ICD-10-CM | POA: Diagnosis not present

## 2017-01-02 DIAGNOSIS — T8189XA Other complications of procedures, not elsewhere classified, initial encounter: Secondary | ICD-10-CM | POA: Diagnosis not present

## 2017-01-02 DIAGNOSIS — L89314 Pressure ulcer of right buttock, stage 4: Secondary | ICD-10-CM | POA: Insufficient documentation

## 2017-01-02 DIAGNOSIS — Y838 Other surgical procedures as the cause of abnormal reaction of the patient, or of later complication, without mention of misadventure at the time of the procedure: Secondary | ICD-10-CM | POA: Insufficient documentation

## 2017-01-16 DIAGNOSIS — L89314 Pressure ulcer of right buttock, stage 4: Secondary | ICD-10-CM | POA: Diagnosis not present

## 2017-01-30 ENCOUNTER — Encounter (HOSPITAL_BASED_OUTPATIENT_CLINIC_OR_DEPARTMENT_OTHER): Payer: Medicare (Managed Care) | Attending: Internal Medicine

## 2017-01-30 DIAGNOSIS — L89314 Pressure ulcer of right buttock, stage 4: Secondary | ICD-10-CM | POA: Insufficient documentation

## 2017-01-30 DIAGNOSIS — X58XXXA Exposure to other specified factors, initial encounter: Secondary | ICD-10-CM | POA: Diagnosis not present

## 2017-01-30 DIAGNOSIS — E11622 Type 2 diabetes mellitus with other skin ulcer: Secondary | ICD-10-CM | POA: Insufficient documentation

## 2017-01-30 DIAGNOSIS — L97519 Non-pressure chronic ulcer of other part of right foot with unspecified severity: Secondary | ICD-10-CM | POA: Diagnosis not present

## 2017-01-30 DIAGNOSIS — I10 Essential (primary) hypertension: Secondary | ICD-10-CM | POA: Insufficient documentation

## 2017-01-30 DIAGNOSIS — R159 Full incontinence of feces: Secondary | ICD-10-CM | POA: Insufficient documentation

## 2017-01-30 DIAGNOSIS — G8220 Paraplegia, unspecified: Secondary | ICD-10-CM | POA: Insufficient documentation

## 2017-01-30 DIAGNOSIS — Z96653 Presence of artificial knee joint, bilateral: Secondary | ICD-10-CM | POA: Insufficient documentation

## 2017-01-30 DIAGNOSIS — S31819A Unspecified open wound of right buttock, initial encounter: Secondary | ICD-10-CM | POA: Insufficient documentation

## 2017-01-30 DIAGNOSIS — E119 Type 2 diabetes mellitus without complications: Secondary | ICD-10-CM | POA: Diagnosis not present

## 2017-01-30 DIAGNOSIS — G35 Multiple sclerosis: Secondary | ICD-10-CM | POA: Insufficient documentation

## 2017-02-13 DIAGNOSIS — L89314 Pressure ulcer of right buttock, stage 4: Secondary | ICD-10-CM | POA: Diagnosis not present

## 2017-02-27 DIAGNOSIS — L89314 Pressure ulcer of right buttock, stage 4: Secondary | ICD-10-CM | POA: Diagnosis not present

## 2017-03-06 ENCOUNTER — Ambulatory Visit (INDEPENDENT_AMBULATORY_CARE_PROVIDER_SITE_OTHER): Payer: Medicare (Managed Care) | Admitting: Adult Health

## 2017-03-06 ENCOUNTER — Encounter: Payer: Self-pay | Admitting: Adult Health

## 2017-03-06 VITALS — BP 123/81 | HR 102 | Ht 62.0 in | Wt 193.8 lb

## 2017-03-06 DIAGNOSIS — R269 Unspecified abnormalities of gait and mobility: Secondary | ICD-10-CM

## 2017-03-06 DIAGNOSIS — G35 Multiple sclerosis: Secondary | ICD-10-CM

## 2017-03-06 DIAGNOSIS — Z5181 Encounter for therapeutic drug level monitoring: Secondary | ICD-10-CM

## 2017-03-06 NOTE — Progress Notes (Signed)
I have read the note, and I agree with the clinical assessment and plan.  Marajade Lei KEITH   

## 2017-03-06 NOTE — Addendum Note (Signed)
Addended by: Brandon Melnick on: 03/06/2017 02:40 PM   Modules accepted: Orders

## 2017-03-06 NOTE — Patient Instructions (Signed)
Your Plan:  Continue tecfidera  Blood work today MRI brain and cervical spine If your symptoms worsen or you develop new symptoms please let us know.    Thank you for coming to see [place] at [facility]. I hope we have been able to provide you high quality care today.  You may receive a patient satisfaction survey over the next few weeks. We would appreciate your feedback and comments so that we may continue to improve ourselves and the health of our patients.

## 2017-03-06 NOTE — Progress Notes (Signed)
PATIENT: Laura Roberts DOB: 03-31-1941  REASON FOR VISIT: follow up HISTORY FROM: patient  HISTORY OF PRESENT ILLNESS: Today 03/06/17 Laura Roberts is a 76 year old female with a history of multiple sclerosis with spastic paraparesis. She returns today for follow-up. She reports that she's been at [facility] with a decubitus ulcer. She states that it is healing well and she hopes to go home and returned to [facility] very soon. She denies any new numbness or weakness. Denies any changes with her vision. She has a urinary catheter in place for approximately one year. She denies any changes with the bowels. She uses a motorized wheelchair to ambulate. She continues on Tecfidera. She denies any new symptoms. She returns today for an evaluation.  HISTORY 06/03/16: Laura Roberts is a 76 year old right-handed black female with a history of multiple sclerosis with a spastic paraparesis. The patient has had good improvement in pain with the use of baclofen which reduces spasticity in the legs. The patient still has discomfort with the left shoulder secondary to intrinsic shoulder disease. She has had injections in the shoulder without much benefit. She remains on low-dose prednisone, and she takes Tecfidera. The patient is not ambulatory, she has an indwelling catheter in the bladder secondary to a neurogenic bladder. The patient uses a motorized wheelchair for mobility. She reports no change in arm strength, vision, or memory since last seen. The patient is on Tecfidera, she is tolerating this well.  REVIEW OF SYSTEMS: Out of a complete 14 system review of symptoms, the patient complains only of the following symptoms, and all other reviewed systems are negative.  See history of present illness  ALLERGIES: Allergies  Allergen Reactions  . Codeine Other (See Comments)    Difficult breathing and skin problem  . Ultram [Tramadol] Other (See Comments)    Difficult breathing and skin peeling  . Januvia  [Sitagliptin] Rash    Blisters    HOME MEDICATIONS: Outpatient Medications Prior to Visit  Medication Sig Dispense Refill  . acetaminophen (TYLENOL) 325 MG tablet Take 325 mg by mouth 2 (two) times daily.     . Amino Acids-Protein Hydrolys (FEEDING SUPPLEMENT, PRO-STAT SUGAR FREE 64,) LIQD Take 30 mLs by mouth 2 (two) times daily. 900 mL 0  . amLODipine (NORVASC) 5 MG tablet Take 10 mg by mouth at bedtime.     . ARTIFICIAL TEAR OP Apply 1 drop to eye 4 (four) times daily as needed (dry eyes).     . baclofen (LIORESAL) 10 MG tablet 1.5 tablet in the morning, 1 tablet midday, two tablets at night 135 tablet 5  . calcium citrate (CALCITRATE - DOSED IN MG ELEMENTAL CALCIUM) 950 MG tablet Take 200 mg of elemental calcium by mouth daily.    . cholecalciferol (VITAMIN D) 1000 UNITS tablet Take 1,000 Units by mouth daily.    . Dimethyl Fumarate (TECFIDERA) 240 MG CPDR Take 1 capsule (240 mg total) by mouth 2 (two) times daily. 60 capsule 11  . DULoxetine (CYMBALTA) 60 MG capsule Take 90 mg by mouth daily. Take along with a 30mg     . gabapentin (NEURONTIN) 300 MG capsule Take 1 capsule (300 mg total) by mouth 3 (three) times daily. 90 capsule 0  . glimepiride (AMARYL) 2 MG tablet Take 2 mg by mouth daily.     . insulin glargine (LANTUS) 100 UNIT/ML injection Inject 10 Units into the skin at bedtime.    . lidocaine (LIDODERM) 5 % Place 1 patch onto the skin daily as  needed (pain). Remove & Discard patch within 12 hours or as directed by MD     . omeprazole (PRILOSEC) 20 MG capsule Take 20 mg by mouth daily.    Marland Kitchen Petrolatum-Zinc Oxide (SENSI-CARE PROTECTIVE BARRIER EX) Apply 1 application topically daily as needed (skin).     . polyethylene glycol (MIRALAX / GLYCOLAX) packet Take 17 g by mouth daily as needed for mild constipation. 14 each 0  . predniSONE (DELTASONE) 5 MG tablet 1 tablet on odd days, one half tablet on even days    . valsartan (DIOVAN) 320 MG tablet Take 320 mg by mouth at bedtime.        No facility-administered medications prior to visit.     PAST MEDICAL HISTORY: Past Medical History:  Diagnosis Date  . Degenerative arthritis   . Diabetes mellitus without complication ([place])   . Gait disturbance   . Hypertension   . Multiple sclerosis ([place])   . Neuromuscular disorder (HCC)    Bilateral hand carpal tunnel syndrome  . Obesity   . Spinal stenosis in cervical region   . Spinal stenosis of lumbosacral region   . Spinal stenosis, thoracic     PAST SURGICAL HISTORY: Past Surgical History:  Procedure Laterality Date  . ABDOMINAL HYSTERECTOMY    . BACK SURGERY    . CATARACT EXTRACTION Right   . CHOLECYSTECTOMY    . FEMUR IM NAIL Right 06/02/2014   Procedure: INTRAMEDULLARY (IM) RETROGRADE FEMORAL NAILING;  Surgeon: Johnny Bridge, MD;  Location: [place];  Service: Orthopedics;  Laterality: Right;  . left hand carpal tunnel surgery    . REPLACEMENT TOTAL KNEE BILATERAL  2004    FAMILY HISTORY: Family History  Problem Relation Age of Onset  . Multiple sclerosis Other        neices.   . Cancer Mother   . Diabetes Mother   . GI Bleed Sister        diverticulitis    SOCIAL HISTORY: Social History   Social History  . Marital status: Widowed    Spouse name: N/A  . Number of children: 2  . Years of education: 85yr colleg   Occupational History  . [employer]    NIGHT RESIDENT [employer]   Social History Main Topics  . Smoking status: Never Smoker  . Smokeless tobacco: Never Used  . Alcohol use No  . Drug use: No  . Sexual activity: No   Other Topics Concern  . Not on file   Social History Narrative   Patient is widowed with 2 children   Patient is right handed   Patient has 2 yrs of college   Patient drinks 2-3 cups of tea daily      PHYSICAL EXAM  Vitals:   03/06/17 1404  Weight: 193 lb 12.8 oz (87.9 kg)  Height: 5\' 2"  (1.575 m)   Body mass index is 35.45 kg/m.  Generalized: Well developed, in no acute distress   Neurological  examination  Mentation: Alert oriented to time, place, history taking. Follows all commands speech and language fluent Cranial nerve II-XII: Pupils were equal round reactive to light. Extraocular movements were full, visual field were full on confrontational test. Facial sensation and strength were normal. Uvula tongue midline. Head turning and shoulder shrug  were normal and symmetric. Motor: The motor testing reveals 5 over 5 strength In the upper she reads. Limited range of motion in the left arm due to shoulder restriction. No movement of the lower extremity.  Sensory: Sensory testing is intact to soft touch on all 4 extremities. No evidence of extinction is noted.  Coordination: Cerebellar testing reveals good finger-nose-finger bilaterally. Unable to complete heel to shin Gait and station: Patient is not able to. She is in a motorized wheelchair today. Reflexes: Deep tendon reflexes are symmetric and normal bilaterally.   DIAGNOSTIC DATA (LABS, IMAGING, TESTING) - I reviewed patient records, labs, notes, testing and imaging myself where available.  Lab Results  Component Value Date   WBC 12.0 (H) 11/26/2016   HGB 8.3 (L) 11/26/2016   HCT 27.5 (L) 11/26/2016   MCV 80.6 11/26/2016   PLT 592 (H) 11/26/2016      Component Value Date/Time   NA 138 11/26/2016 0221   NA 141 06/03/2016 1425   K 4.2 11/26/2016 0221   CL 99 (L) 11/26/2016 0221   CO2 32 11/26/2016 0221   GLUCOSE 213 (H) 11/26/2016 0221   BUN 11 11/26/2016 0221   BUN 19 06/03/2016 1425   CREATININE 0.62 11/26/2016 0221   CALCIUM 8.4 (L) 11/26/2016 0221   PROT 5.9 (L) 11/22/2016 1536   PROT 6.2 06/03/2016 1425   ALBUMIN 2.1 (L) 11/22/2016 1536   ALBUMIN 3.9 06/03/2016 1425   AST 26 11/22/2016 1536   ALT 24 11/22/2016 1536   ALKPHOS 98 11/22/2016 1536   BILITOT 1.8 (H) 11/22/2016 1536   BILITOT 0.3 06/03/2016 1425   GFRNONAA >60 11/26/2016 0221   GFRAA >60 11/26/2016 0221    Lab Results  Component Value Date    HGBA1C 7.6 (H) 07/21/2012    Lab Results  Component Value Date   TSH 0.102 (L) 07/21/2012      ASSESSMENT AND PLAN 76 y.o. year old female  has a past medical history of Degenerative arthritis; Diabetes mellitus without complication ([place]); Gait disturbance; Hypertension; Multiple sclerosis ([place]); Neuromuscular disorder ([place]); Obesity; Spinal stenosis in cervical region; Spinal stenosis of lumbosacral region; and Spinal stenosis, thoracic. here with:  1. Multiple sclerosis 2. Abnormality of gait  Overall the patient is doing well. She denies any new symptoms. [facility]. Patient will continue on Tecfidera. I will check blood work today. Her last MRI was in 2015. We will repeat MRI of the brain and cervical spine. She is advised that if her symptoms worsen or she develops new symptoms she she'll let us know. She will follow-up in 6 months or sooner if needed.   Ward Givens, MSN, NP-C 03/06/2017, 2:12 PM [facility] [address], [place], [place] [address] [phone]

## 2017-03-07 LAB — COMPREHENSIVE METABOLIC PANEL
A/G RATIO: 1.5 (ref 1.2–2.2)
ALT: 9 IU/L (ref 0–32)
AST: 11 IU/L (ref 0–40)
Albumin: 4.3 g/dL (ref 3.5–4.8)
Alkaline Phosphatase: 79 IU/L (ref 39–117)
BUN/Creatinine Ratio: 28 (ref 12–28)
BUN: 21 mg/dL (ref 8–27)
Bilirubin Total: 0.3 mg/dL (ref 0.0–1.2)
CALCIUM: 10.2 mg/dL (ref 8.7–10.3)
CO2: 31 mmol/L — AB (ref 20–29)
Chloride: 100 mmol/L (ref 96–106)
Creatinine, Ser: 0.76 mg/dL (ref 0.57–1.00)
GFR calc Af Amer: 89 mL/min/{1.73_m2} (ref >59)
GFR, EST NON AFRICAN AMERICAN: 78 mL/min/{1.73_m2} (ref >59)
Globulin, Total: 2.9 g/dL (ref 1.5–4.5)
Glucose: 90 mg/dL (ref 65–99)
POTASSIUM: 4.4 mmol/L (ref 3.5–5.2)
Sodium: 145 mmol/L — ABNORMAL HIGH (ref 134–144)
Total Protein: 7.2 g/dL (ref 6.0–8.5)

## 2017-03-07 LAB — CBC WITH DIFFERENTIAL/PLATELET
BASOS ABS: 0 10*3/uL (ref 0.0–0.2)
BASOS: 0 %
EOS (ABSOLUTE): 0.5 10*3/uL — AB (ref 0.0–0.4)
Eos: 4 %
HEMOGLOBIN: 12.2 g/dL (ref 11.1–15.9)
Hematocrit: 38.3 % (ref 34.0–46.6)
IMMATURE GRANS (ABS): 0.1 10*3/uL (ref 0.0–0.1)
IMMATURE GRANULOCYTES: 1 %
LYMPHS: 9 %
Lymphocytes Absolute: 1.3 10*3/uL (ref 0.7–3.1)
MCH: 26.2 pg — ABNORMAL LOW (ref 26.6–33.0)
MCHC: 31.9 g/dL (ref 31.5–35.7)
MCV: 82 fL (ref 79–97)
Monocytes Absolute: 0.9 10*3/uL (ref 0.1–0.9)
Monocytes: 7 %
NEUTROS PCT: 79 %
Neutrophils Absolute: 10.8 10*3/uL — ABNORMAL HIGH (ref 1.4–7.0)
Platelets: 363 10*3/uL (ref 150–379)
RBC: 4.65 x10E6/uL (ref 3.77–5.28)
RDW: 17.1 % — ABNORMAL HIGH (ref 12.3–15.4)
WBC: 13.6 10*3/uL — AB (ref 3.4–10.8)

## 2017-03-12 ENCOUNTER — Telehealth: Payer: Self-pay | Admitting: *Deleted

## 2017-03-12 NOTE — Telephone Encounter (Signed)
Spoke with patient and informed her that her blood work is consistent with her previous blood work. Advised her white blood cell count and neutrophil count is elevated. However she does have a decubitus ulcer. Advised that her labs will be sent to her PCP. She stated to forward her labs to [facility], Dr Bradd Burner. She verbalized understanding, appreciation of call.  Labs faxed per NP.

## 2017-03-13 ENCOUNTER — Encounter (HOSPITAL_BASED_OUTPATIENT_CLINIC_OR_DEPARTMENT_OTHER): Payer: Medicare (Managed Care) | Attending: Internal Medicine

## 2017-03-13 DIAGNOSIS — L89314 Pressure ulcer of right buttock, stage 4: Secondary | ICD-10-CM | POA: Diagnosis not present

## 2017-03-13 DIAGNOSIS — E1140 Type 2 diabetes mellitus with diabetic neuropathy, unspecified: Secondary | ICD-10-CM | POA: Diagnosis not present

## 2017-03-13 DIAGNOSIS — G35 Multiple sclerosis: Secondary | ICD-10-CM | POA: Insufficient documentation

## 2017-03-13 DIAGNOSIS — I10 Essential (primary) hypertension: Secondary | ICD-10-CM | POA: Insufficient documentation

## 2017-03-14 ENCOUNTER — Ambulatory Visit (HOSPITAL_COMMUNITY): Payer: Medicare (Managed Care)

## 2017-03-21 ENCOUNTER — Ambulatory Visit (HOSPITAL_COMMUNITY)
Admission: RE | Admit: 2017-03-21 | Discharge: 2017-03-21 | Disposition: A | Discharge status: Home / Self Care | Payer: Medicare (Managed Care) | Source: Ambulatory Visit | Attending: Adult Health | Admitting: Adult Health

## 2017-03-21 DIAGNOSIS — G35 Multiple sclerosis: Secondary | ICD-10-CM | POA: Diagnosis not present

## 2017-03-21 DIAGNOSIS — M4802 Spinal stenosis, cervical region: Secondary | ICD-10-CM | POA: Insufficient documentation

## 2017-03-21 DIAGNOSIS — E042 Nontoxic multinodular goiter: Secondary | ICD-10-CM | POA: Diagnosis not present

## 2017-03-21 MED ORDER — GADOBENATE DIMEGLUMINE 529 MG/ML IV SOLN
20.0000 mL | Freq: Once | INTRAVENOUS | Status: AC | PRN
  Administered 2017-03-21: 18 mL via INTRAVENOUS

## 2017-03-25 ENCOUNTER — Telehealth: Payer: Self-pay | Admitting: *Deleted

## 2017-03-25 NOTE — Telephone Encounter (Signed)
Spoke with patient and informed her that her MRI of the brain is relatively unchanged compared to 2015. Advised her the MRI of the cervical spine is relatively unchanged. There is some mildly progression of the cervical spondylosis noted in 2014, however it is nothing significant. Advised her these are age-related changes. Patient requested a copy be faxed to [facility], Dr Bradd Burner who manges her medical care. She has signed a release in 2014. Reports faxed to Dr Duanne Limerick, [facility] as patient requested.

## 2017-03-27 DIAGNOSIS — L89314 Pressure ulcer of right buttock, stage 4: Secondary | ICD-10-CM | POA: Diagnosis not present

## 2017-04-10 ENCOUNTER — Encounter (HOSPITAL_BASED_OUTPATIENT_CLINIC_OR_DEPARTMENT_OTHER): Payer: Medicare (Managed Care) | Attending: Internal Medicine

## 2017-04-10 DIAGNOSIS — G35 Multiple sclerosis: Secondary | ICD-10-CM | POA: Insufficient documentation

## 2017-04-10 DIAGNOSIS — I10 Essential (primary) hypertension: Secondary | ICD-10-CM | POA: Insufficient documentation

## 2017-04-10 DIAGNOSIS — E1140 Type 2 diabetes mellitus with diabetic neuropathy, unspecified: Secondary | ICD-10-CM | POA: Insufficient documentation

## 2017-04-10 DIAGNOSIS — Z96653 Presence of artificial knee joint, bilateral: Secondary | ICD-10-CM | POA: Insufficient documentation

## 2017-04-10 DIAGNOSIS — G8220 Paraplegia, unspecified: Secondary | ICD-10-CM | POA: Insufficient documentation

## 2017-04-10 DIAGNOSIS — L89314 Pressure ulcer of right buttock, stage 4: Secondary | ICD-10-CM | POA: Insufficient documentation

## 2017-04-17 DIAGNOSIS — G35 Multiple sclerosis: Secondary | ICD-10-CM | POA: Diagnosis not present

## 2017-04-17 DIAGNOSIS — L89314 Pressure ulcer of right buttock, stage 4: Secondary | ICD-10-CM | POA: Diagnosis not present

## 2017-04-17 DIAGNOSIS — E1140 Type 2 diabetes mellitus with diabetic neuropathy, unspecified: Secondary | ICD-10-CM | POA: Diagnosis not present

## 2017-04-17 DIAGNOSIS — Z96653 Presence of artificial knee joint, bilateral: Secondary | ICD-10-CM | POA: Diagnosis not present

## 2017-04-17 DIAGNOSIS — G8220 Paraplegia, unspecified: Secondary | ICD-10-CM | POA: Diagnosis not present

## 2017-04-17 DIAGNOSIS — I10 Essential (primary) hypertension: Secondary | ICD-10-CM | POA: Diagnosis not present

## 2017-05-01 ENCOUNTER — Encounter (HOSPITAL_BASED_OUTPATIENT_CLINIC_OR_DEPARTMENT_OTHER): Payer: Medicare (Managed Care) | Attending: Internal Medicine

## 2017-05-01 DIAGNOSIS — G8220 Paraplegia, unspecified: Secondary | ICD-10-CM | POA: Insufficient documentation

## 2017-05-01 DIAGNOSIS — L89613 Pressure ulcer of right heel, stage 3: Secondary | ICD-10-CM | POA: Insufficient documentation

## 2017-05-01 DIAGNOSIS — Y838 Other surgical procedures as the cause of abnormal reaction of the patient, or of later complication, without mention of misadventure at the time of the procedure: Secondary | ICD-10-CM | POA: Diagnosis not present

## 2017-05-01 DIAGNOSIS — I10 Essential (primary) hypertension: Secondary | ICD-10-CM | POA: Diagnosis not present

## 2017-05-01 DIAGNOSIS — T8131XA Disruption of external operation (surgical) wound, not elsewhere classified, initial encounter: Secondary | ICD-10-CM | POA: Diagnosis not present

## 2017-05-01 DIAGNOSIS — Z96653 Presence of artificial knee joint, bilateral: Secondary | ICD-10-CM | POA: Diagnosis not present

## 2017-05-01 DIAGNOSIS — G35 Multiple sclerosis: Secondary | ICD-10-CM | POA: Diagnosis not present

## 2017-05-01 DIAGNOSIS — L89314 Pressure ulcer of right buttock, stage 4: Secondary | ICD-10-CM | POA: Insufficient documentation

## 2017-05-01 DIAGNOSIS — E119 Type 2 diabetes mellitus without complications: Secondary | ICD-10-CM | POA: Diagnosis not present

## 2017-05-15 DIAGNOSIS — L89314 Pressure ulcer of right buttock, stage 4: Secondary | ICD-10-CM | POA: Diagnosis not present

## 2017-05-29 DIAGNOSIS — L89314 Pressure ulcer of right buttock, stage 4: Secondary | ICD-10-CM | POA: Diagnosis not present

## 2017-06-13 ENCOUNTER — Encounter (HOSPITAL_BASED_OUTPATIENT_CLINIC_OR_DEPARTMENT_OTHER): Payer: Medicare (Managed Care) | Attending: Internal Medicine

## 2017-06-13 DIAGNOSIS — E1140 Type 2 diabetes mellitus with diabetic neuropathy, unspecified: Secondary | ICD-10-CM | POA: Insufficient documentation

## 2017-06-13 DIAGNOSIS — I10 Essential (primary) hypertension: Secondary | ICD-10-CM | POA: Diagnosis not present

## 2017-06-13 DIAGNOSIS — G8220 Paraplegia, unspecified: Secondary | ICD-10-CM | POA: Insufficient documentation

## 2017-06-13 DIAGNOSIS — Z09 Encounter for follow-up examination after completed treatment for conditions other than malignant neoplasm: Secondary | ICD-10-CM | POA: Diagnosis not present

## 2017-06-13 DIAGNOSIS — Z872 Personal history of diseases of the skin and subcutaneous tissue: Secondary | ICD-10-CM | POA: Insufficient documentation

## 2017-06-13 DIAGNOSIS — G35 Multiple sclerosis: Secondary | ICD-10-CM | POA: Diagnosis not present

## 2017-08-13 ENCOUNTER — Encounter (HOSPITAL_COMMUNITY): Payer: Self-pay | Admitting: Emergency Medicine

## 2017-08-13 ENCOUNTER — Other Ambulatory Visit: Payer: Self-pay

## 2017-08-13 ENCOUNTER — Emergency Department (HOSPITAL_COMMUNITY): Payer: Medicare (Managed Care)

## 2017-08-13 ENCOUNTER — Inpatient Hospital Stay (HOSPITAL_COMMUNITY)
Admission: EM | Admit: 2017-08-13 | Discharge: 2017-08-18 | DRG: 698 | Disposition: A | Discharge status: Home / Self Care | Payer: Medicare (Managed Care) | Attending: Internal Medicine | Admitting: Internal Medicine

## 2017-08-13 DIAGNOSIS — B9689 Other specified bacterial agents as the cause of diseases classified elsewhere: Secondary | ICD-10-CM | POA: Diagnosis not present

## 2017-08-13 DIAGNOSIS — Z794 Long term (current) use of insulin: Secondary | ICD-10-CM | POA: Diagnosis not present

## 2017-08-13 DIAGNOSIS — Y838 Other surgical procedures as the cause of abnormal reaction of the patient, or of later complication, without mention of misadventure at the time of the procedure: Secondary | ICD-10-CM | POA: Diagnosis not present

## 2017-08-13 DIAGNOSIS — E1143 Type 2 diabetes mellitus with diabetic autonomic (poly)neuropathy: Secondary | ICD-10-CM | POA: Diagnosis present

## 2017-08-13 DIAGNOSIS — D649 Anemia, unspecified: Secondary | ICD-10-CM | POA: Diagnosis present

## 2017-08-13 DIAGNOSIS — Z66 Do not resuscitate: Secondary | ICD-10-CM | POA: Diagnosis present

## 2017-08-13 DIAGNOSIS — E876 Hypokalemia: Secondary | ICD-10-CM | POA: Diagnosis present

## 2017-08-13 DIAGNOSIS — Z9049 Acquired absence of other specified parts of digestive tract: Secondary | ICD-10-CM | POA: Diagnosis not present

## 2017-08-13 DIAGNOSIS — Z96653 Presence of artificial knee joint, bilateral: Secondary | ICD-10-CM | POA: Diagnosis present

## 2017-08-13 DIAGNOSIS — G8929 Other chronic pain: Secondary | ICD-10-CM | POA: Diagnosis not present

## 2017-08-13 DIAGNOSIS — Z820 Family history of epilepsy and other diseases of the nervous system: Secondary | ICD-10-CM | POA: Diagnosis not present

## 2017-08-13 DIAGNOSIS — Z833 Family history of diabetes mellitus: Secondary | ICD-10-CM | POA: Diagnosis not present

## 2017-08-13 DIAGNOSIS — E785 Hyperlipidemia, unspecified: Secondary | ICD-10-CM | POA: Diagnosis present

## 2017-08-13 DIAGNOSIS — Z809 Family history of malignant neoplasm, unspecified: Secondary | ICD-10-CM

## 2017-08-13 DIAGNOSIS — R1032 Left lower quadrant pain: Secondary | ICD-10-CM | POA: Diagnosis not present

## 2017-08-13 DIAGNOSIS — Z90710 Acquired absence of both cervix and uterus: Secondary | ICD-10-CM

## 2017-08-13 DIAGNOSIS — R000 Tachycardia, unspecified: Secondary | ICD-10-CM | POA: Diagnosis not present

## 2017-08-13 DIAGNOSIS — M4807 Spinal stenosis, lumbosacral region: Secondary | ICD-10-CM | POA: Diagnosis present

## 2017-08-13 DIAGNOSIS — A419 Sepsis, unspecified organism: Secondary | ICD-10-CM | POA: Diagnosis present

## 2017-08-13 DIAGNOSIS — N390 Urinary tract infection, site not specified: Secondary | ICD-10-CM | POA: Diagnosis present

## 2017-08-13 DIAGNOSIS — R4701 Aphasia: Secondary | ICD-10-CM | POA: Diagnosis present

## 2017-08-13 DIAGNOSIS — T83511A Infection and inflammatory reaction due to indwelling urethral catheter, initial encounter: Secondary | ICD-10-CM | POA: Diagnosis present

## 2017-08-13 DIAGNOSIS — F329 Major depressive disorder, single episode, unspecified: Secondary | ICD-10-CM | POA: Diagnosis present

## 2017-08-13 DIAGNOSIS — M4802 Spinal stenosis, cervical region: Secondary | ICD-10-CM | POA: Diagnosis present

## 2017-08-13 DIAGNOSIS — B9620 Unspecified Escherichia coli [E. coli] as the cause of diseases classified elsewhere: Secondary | ICD-10-CM | POA: Diagnosis not present

## 2017-08-13 DIAGNOSIS — Z885 Allergy status to narcotic agent status: Secondary | ICD-10-CM

## 2017-08-13 DIAGNOSIS — K592 Neurogenic bowel, not elsewhere classified: Secondary | ICD-10-CM | POA: Diagnosis present

## 2017-08-13 DIAGNOSIS — E119 Type 2 diabetes mellitus without complications: Secondary | ICD-10-CM

## 2017-08-13 DIAGNOSIS — B961 Klebsiella pneumoniae [K. pneumoniae] as the cause of diseases classified elsewhere: Secondary | ICD-10-CM | POA: Diagnosis not present

## 2017-08-13 DIAGNOSIS — N319 Neuromuscular dysfunction of bladder, unspecified: Secondary | ICD-10-CM | POA: Diagnosis present

## 2017-08-13 DIAGNOSIS — Z7952 Long term (current) use of systemic steroids: Secondary | ICD-10-CM

## 2017-08-13 DIAGNOSIS — G8220 Paraplegia, unspecified: Secondary | ICD-10-CM | POA: Diagnosis present

## 2017-08-13 DIAGNOSIS — T83511D Infection and inflammatory reaction due to indwelling urethral catheter, subsequent encounter: Secondary | ICD-10-CM | POA: Diagnosis not present

## 2017-08-13 DIAGNOSIS — M1990 Unspecified osteoarthritis, unspecified site: Secondary | ICD-10-CM | POA: Diagnosis not present

## 2017-08-13 DIAGNOSIS — Z993 Dependence on wheelchair: Secondary | ICD-10-CM

## 2017-08-13 DIAGNOSIS — R509 Fever, unspecified: Secondary | ICD-10-CM | POA: Diagnosis present

## 2017-08-13 DIAGNOSIS — I10 Essential (primary) hypertension: Secondary | ICD-10-CM | POA: Diagnosis present

## 2017-08-13 DIAGNOSIS — G35 Multiple sclerosis: Secondary | ICD-10-CM | POA: Diagnosis present

## 2017-08-13 DIAGNOSIS — Y846 Urinary catheterization as the cause of abnormal reaction of the patient, or of later complication, without mention of misadventure at the time of the procedure: Secondary | ICD-10-CM | POA: Diagnosis present

## 2017-08-13 DIAGNOSIS — E0590 Thyrotoxicosis, unspecified without thyrotoxic crisis or storm: Secondary | ICD-10-CM | POA: Diagnosis not present

## 2017-08-13 DIAGNOSIS — Z9841 Cataract extraction status, right eye: Secondary | ICD-10-CM | POA: Diagnosis not present

## 2017-08-13 DIAGNOSIS — Z888 Allergy status to other drugs, medicaments and biological substances status: Secondary | ICD-10-CM | POA: Diagnosis not present

## 2017-08-13 DIAGNOSIS — E039 Hypothyroidism, unspecified: Secondary | ICD-10-CM | POA: Diagnosis present

## 2017-08-13 DIAGNOSIS — G5603 Carpal tunnel syndrome, bilateral upper limbs: Secondary | ICD-10-CM | POA: Diagnosis present

## 2017-08-13 DIAGNOSIS — F339 Major depressive disorder, recurrent, unspecified: Secondary | ICD-10-CM | POA: Diagnosis not present

## 2017-08-13 DIAGNOSIS — K219 Gastro-esophageal reflux disease without esophagitis: Secondary | ICD-10-CM | POA: Diagnosis not present

## 2017-08-13 DIAGNOSIS — M62838 Other muscle spasm: Secondary | ICD-10-CM | POA: Diagnosis present

## 2017-08-13 LAB — CBC WITH DIFFERENTIAL/PLATELET
BAND NEUTROPHILS: 0 %
BASOS PCT: 0 %
Basophils Absolute: 0 10*3/uL (ref 0.0–0.1)
Blasts: 0 %
EOS ABS: 0 10*3/uL (ref 0.0–0.7)
Eosinophils Relative: 0 %
HCT: 35.1 % — ABNORMAL LOW (ref 36.0–46.0)
HEMOGLOBIN: 11.4 g/dL — AB (ref 12.0–15.0)
LYMPHS ABS: 2.2 10*3/uL (ref 0.7–4.0)
Lymphocytes Relative: 8 %
MCH: 26.9 pg (ref 26.0–34.0)
MCHC: 32.5 g/dL (ref 30.0–36.0)
MCV: 82.8 fL (ref 78.0–100.0)
METAMYELOCYTES PCT: 0 %
MONO ABS: 1.7 10*3/uL — AB (ref 0.1–1.0)
MYELOCYTES: 0 %
Monocytes Relative: 6 %
Neutro Abs: 23.8 10*3/uL — ABNORMAL HIGH (ref 1.7–7.7)
Neutrophils Relative %: 86 %
Other: 0 %
PLATELETS: 481 10*3/uL — AB (ref 150–400)
PROMYELOCYTES ABS: 0 %
RBC: 4.24 MIL/uL (ref 3.87–5.11)
RDW: 18.3 % — ABNORMAL HIGH (ref 11.5–15.5)
WBC: 27.7 10*3/uL — ABNORMAL HIGH (ref 4.0–10.5)
nRBC: 0 /100 WBC

## 2017-08-13 LAB — URINALYSIS, ROUTINE W REFLEX MICROSCOPIC
BILIRUBIN URINE: NEGATIVE
GLUCOSE, UA: NEGATIVE mg/dL
Ketones, ur: 80 mg/dL — AB
NITRITE: POSITIVE — AB
PH: 6 (ref 5.0–8.0)
Protein, ur: 100 mg/dL — AB
SPECIFIC GRAVITY, URINE: 1.021 (ref 1.005–1.030)

## 2017-08-13 LAB — COMPREHENSIVE METABOLIC PANEL
ALK PHOS: 87 U/L (ref 38–126)
ALT: 13 U/L — AB (ref 14–54)
AST: 17 U/L (ref 15–41)
Albumin: 2.2 g/dL — ABNORMAL LOW (ref 3.5–5.0)
Anion gap: 20 — ABNORMAL HIGH (ref 5–15)
BUN: 5 mg/dL — AB (ref 6–20)
CALCIUM: 8.7 mg/dL — AB (ref 8.9–10.3)
CO2: 29 mmol/L (ref 22–32)
CREATININE: 0.8 mg/dL (ref 0.44–1.00)
Chloride: 84 mmol/L — ABNORMAL LOW (ref 101–111)
GFR calc Af Amer: >60 mL/min (ref >60)
GFR calc non Af Amer: >60 mL/min (ref >60)
GLUCOSE: 158 mg/dL — AB (ref 65–99)
Potassium: 2.3 mmol/L — CL (ref 3.5–5.1)
SODIUM: 133 mmol/L — AB (ref 135–145)
Total Bilirubin: 2.1 mg/dL — ABNORMAL HIGH (ref 0.3–1.2)
Total Protein: 7.2 g/dL (ref 6.5–8.1)

## 2017-08-13 LAB — I-STAT CG4 LACTIC ACID, ED: Lactic Acid, Venous: 1.46 mmol/L (ref 0.5–1.9)

## 2017-08-13 LAB — GLUCOSE, CAPILLARY: GLUCOSE-CAPILLARY: 191 mg/dL — AB (ref 65–99)

## 2017-08-13 MED ORDER — POTASSIUM CHLORIDE 10 MEQ/100ML IV SOLN
10.0000 meq | INTRAVENOUS | Status: AC
  Administered 2017-08-14 (×4): 10 meq via INTRAVENOUS
  Filled 2017-08-13 (×4): qty 100

## 2017-08-13 MED ORDER — HYDROCERIN EX CREA
1.0000 | TOPICAL_CREAM | Freq: Two times a day (BID) | CUTANEOUS | Status: DC
Start: 2017-08-13 — End: 2017-08-18
  Administered 2017-08-14 – 2017-08-18 (×10): 1 via TOPICAL
  Filled 2017-08-13: qty 113

## 2017-08-13 MED ORDER — POTASSIUM CHLORIDE CRYS ER 20 MEQ PO TBCR
40.0000 meq | EXTENDED_RELEASE_TABLET | Freq: Once | ORAL | Status: AC
  Administered 2017-08-13: 40 meq via ORAL
  Filled 2017-08-13: qty 2

## 2017-08-13 MED ORDER — CALCIUM CITRATE 950 (200 CA) MG PO TABS
200.0000 mg | ORAL_TABLET | Freq: Every day | ORAL | Status: DC
  Administered 2017-08-14 – 2017-08-18 (×5): 200 mg via ORAL
  Filled 2017-08-13 (×5): qty 1

## 2017-08-13 MED ORDER — POTASSIUM CHLORIDE IN NACL 20-0.9 MEQ/L-% IV SOLN
INTRAVENOUS | Status: AC
  Administered 2017-08-14: via INTRAVENOUS
  Filled 2017-08-13: qty 1000

## 2017-08-13 MED ORDER — PIPERACILLIN-TAZOBACTAM 4.5 G IVPB: 4.5000 g | Freq: Once | INTRAVENOUS | Status: DC

## 2017-08-13 MED ORDER — SODIUM CHLORIDE 0.9 % IV BOLUS (SEPSIS)
1000.0000 mL | Freq: Once | INTRAVENOUS | Status: AC
  Administered 2017-08-13: 1000 mL via INTRAVENOUS

## 2017-08-13 MED ORDER — ACETAMINOPHEN 500 MG PO TABS
1000.0000 mg | ORAL_TABLET | Freq: Once | ORAL | Status: AC
  Administered 2017-08-13: 1000 mg via ORAL
  Filled 2017-08-13: qty 2

## 2017-08-13 MED ORDER — ONDANSETRON HCL 4 MG/2ML IJ SOLN
4.0000 mg | Freq: Once | INTRAMUSCULAR | Status: AC
Start: 2017-08-13 — End: 2017-08-13
  Administered 2017-08-13: 4 mg via INTRAVENOUS
  Filled 2017-08-13: qty 2

## 2017-08-13 MED ORDER — GLUCERNA SHAKE PO LIQD
237.0000 mL | Freq: Two times a day (BID) | ORAL | Status: DC
  Administered 2017-08-14 – 2017-08-17 (×6): 237 mL via ORAL

## 2017-08-13 MED ORDER — BACLOFEN 10 MG PO TABS: 10.0000 mg | ORAL_TABLET | ORAL | Status: DC

## 2017-08-13 MED ORDER — GABAPENTIN 300 MG PO CAPS
300.0000 mg | ORAL_CAPSULE | Freq: Two times a day (BID) | ORAL | Status: DC
  Administered 2017-08-14 – 2017-08-18 (×10): 300 mg via ORAL
  Filled 2017-08-13 (×10): qty 1

## 2017-08-13 MED ORDER — ONDANSETRON 4 MG PO TBDP: 4.0000 mg | ORAL_TABLET | Freq: Four times a day (QID) | ORAL | Status: DC | PRN

## 2017-08-13 MED ORDER — PRO-STAT SUGAR FREE PO LIQD
30.0000 mL | Freq: Two times a day (BID) | ORAL | Status: DC
Start: 2017-08-13 — End: 2017-08-18
  Administered 2017-08-14 – 2017-08-18 (×9): 30 mL via ORAL
  Filled 2017-08-13 (×10): qty 30

## 2017-08-13 MED ORDER — INSULIN GLARGINE 100 UNIT/ML ~~LOC~~ SOLN
15.0000 [IU] | Freq: Every day | SUBCUTANEOUS | Status: DC
  Administered 2017-08-14 – 2017-08-17 (×5): 15 [IU] via SUBCUTANEOUS
  Filled 2017-08-13 (×7): qty 0.15

## 2017-08-13 MED ORDER — SODIUM CHLORIDE 0.9 % IV SOLN
1.0000 g | INTRAVENOUS | Status: DC
  Administered 2017-08-14 – 2017-08-16 (×3): 1 g via INTRAVENOUS
  Filled 2017-08-13 (×3): qty 10

## 2017-08-13 MED ORDER — PREDNISONE 5 MG PO TABS
5.0000 mg | ORAL_TABLET | ORAL | Status: DC
  Administered 2017-08-15 – 2017-08-18 (×3): 5 mg via ORAL
  Filled 2017-08-13 (×4): qty 1

## 2017-08-13 MED ORDER — VANCOMYCIN HCL IN DEXTROSE 1-5 GM/200ML-% IV SOLN
1000.0000 mg | Freq: Once | INTRAVENOUS | Status: AC
  Administered 2017-08-13: 1000 mg via INTRAVENOUS
  Filled 2017-08-13: qty 200

## 2017-08-13 MED ORDER — VITAMIN D 1000 UNITS PO TABS
1000.0000 [IU] | ORAL_TABLET | Freq: Every day | ORAL | Status: DC
  Administered 2017-08-14 – 2017-08-18 (×5): 1000 [IU] via ORAL
  Filled 2017-08-13 (×5): qty 1

## 2017-08-13 MED ORDER — ACETAMINOPHEN 325 MG PO TABS
650.0000 mg | ORAL_TABLET | Freq: Four times a day (QID) | ORAL | Status: DC | PRN
  Administered 2017-08-14 – 2017-08-16 (×5): 650 mg via ORAL
  Filled 2017-08-13 (×6): qty 2

## 2017-08-13 MED ORDER — DIMETHYL FUMARATE 240 MG PO CPDR
240.0000 mg | DELAYED_RELEASE_CAPSULE | Freq: Two times a day (BID) | ORAL | Status: DC
  Administered 2017-08-14 – 2017-08-18 (×9): 240 mg via ORAL
  Filled 2017-08-13 (×10): qty 1

## 2017-08-13 MED ORDER — PREDNISONE 5 MG PO TABS: 5.0000 mg | ORAL_TABLET | ORAL | Status: DC

## 2017-08-13 MED ORDER — INSULIN ASPART 100 UNIT/ML ~~LOC~~ SOLN
0.0000 [IU] | Freq: Three times a day (TID) | SUBCUTANEOUS | Status: DC
  Administered 2017-08-14: 2 [IU] via SUBCUTANEOUS
  Administered 2017-08-14 (×2): 3 [IU] via SUBCUTANEOUS
  Administered 2017-08-15: 7 [IU] via SUBCUTANEOUS
  Administered 2017-08-15: 1 [IU] via SUBCUTANEOUS
  Administered 2017-08-15: 3 [IU] via SUBCUTANEOUS
  Administered 2017-08-16: 5 [IU] via SUBCUTANEOUS
  Administered 2017-08-16: 9 [IU] via SUBCUTANEOUS
  Administered 2017-08-16: 2 [IU] via SUBCUTANEOUS
  Administered 2017-08-17: 7 [IU] via SUBCUTANEOUS
  Administered 2017-08-17: 5 [IU] via SUBCUTANEOUS
  Administered 2017-08-18: 3 [IU] via SUBCUTANEOUS

## 2017-08-13 MED ORDER — ONDANSETRON HCL 4 MG/2ML IJ SOLN
4.0000 mg | Freq: Four times a day (QID) | INTRAMUSCULAR | Status: DC | PRN
  Administered 2017-08-14: 4 mg via INTRAVENOUS
  Filled 2017-08-13: qty 2

## 2017-08-13 MED ORDER — BACLOFEN 10 MG PO TABS
15.0000 mg | ORAL_TABLET | Freq: Every day | ORAL | Status: DC
  Administered 2017-08-14 – 2017-08-18 (×5): 15 mg via ORAL
  Filled 2017-08-13 (×5): qty 2

## 2017-08-13 MED ORDER — POTASSIUM CHLORIDE 20 MEQ/15ML (10%) PO SOLN
40.0000 meq | Freq: Once | ORAL | Status: AC
  Administered 2017-08-14: 40 meq via ORAL
  Filled 2017-08-13: qty 30

## 2017-08-13 MED ORDER — ENOXAPARIN SODIUM 40 MG/0.4ML ~~LOC~~ SOLN
40.0000 mg | Freq: Every day | SUBCUTANEOUS | Status: DC
  Administered 2017-08-14 – 2017-08-18 (×5): 40 mg via SUBCUTANEOUS
  Filled 2017-08-13 (×5): qty 0.4

## 2017-08-13 MED ORDER — POTASSIUM CHLORIDE 10 MEQ/100ML IV SOLN
10.0000 meq | Freq: Once | INTRAVENOUS | Status: AC
  Administered 2017-08-13: 10 meq via INTRAVENOUS
  Filled 2017-08-13: qty 100

## 2017-08-13 MED ORDER — LIDOCAINE 5 % EX PTCH: 1.0000 | MEDICATED_PATCH | Freq: Every day | CUTANEOUS | Status: DC | PRN

## 2017-08-13 MED ORDER — BACLOFEN 10 MG PO TABS
10.0000 mg | ORAL_TABLET | Freq: Every day | ORAL | Status: DC
  Administered 2017-08-14 – 2017-08-17 (×4): 10 mg via ORAL
  Filled 2017-08-13 (×4): qty 1

## 2017-08-13 MED ORDER — PANTOPRAZOLE SODIUM 40 MG PO TBEC
40.0000 mg | DELAYED_RELEASE_TABLET | Freq: Every day | ORAL | Status: DC
  Administered 2017-08-14 – 2017-08-18 (×5): 40 mg via ORAL
  Filled 2017-08-13 (×5): qty 1

## 2017-08-13 MED ORDER — BIOTENE DRY MOUTH MT LIQD
15.0000 mL | Freq: Two times a day (BID) | OROMUCOSAL | Status: DC
  Administered 2017-08-14 – 2017-08-17 (×7): 15 mL via OROMUCOSAL

## 2017-08-13 MED ORDER — BACLOFEN 10 MG PO TABS
20.0000 mg | ORAL_TABLET | Freq: Every day | ORAL | Status: DC
  Administered 2017-08-14 – 2017-08-17 (×5): 20 mg via ORAL
  Filled 2017-08-13 (×5): qty 2

## 2017-08-13 MED ORDER — ACETAMINOPHEN 650 MG RE SUPP: 650.0000 mg | Freq: Four times a day (QID) | RECTAL | Status: DC | PRN

## 2017-08-13 MED ORDER — DULOXETINE HCL 60 MG PO CPEP
90.0000 mg | ORAL_CAPSULE | Freq: Every day | ORAL | Status: DC
  Administered 2017-08-14 – 2017-08-18 (×5): 90 mg via ORAL
  Filled 2017-08-13 (×5): qty 1

## 2017-08-13 MED ORDER — PIPERACILLIN-TAZOBACTAM 3.375 G IVPB 30 MIN
3.3750 g | Freq: Once | INTRAVENOUS | Status: DC
  Administered 2017-08-13: 3.375 g via INTRAVENOUS
  Filled 2017-08-13: qty 50

## 2017-08-13 MED ORDER — PREDNISONE 5 MG PO TABS
2.5000 mg | ORAL_TABLET | ORAL | Status: DC
  Administered 2017-08-14 – 2017-08-16 (×2): 2.5 mg via ORAL
  Filled 2017-08-13 (×5): qty 1

## 2017-08-13 NOTE — ED Notes (Signed)
Patient transported to X-ray 

## 2017-08-13 NOTE — ED Provider Notes (Signed)
[facility] EMERGENCY DEPARTMENT Provider Note   CSN: [id number] Arrival date & time: 08/13/17  1535     History   Chief Complaint Chief Complaint  Patient presents with  . Emesis  . Urinary Tract Infection    HPI Laura Roberts is a 77 y.o. female.  HPI  Patient with history of multiple sclerosis, hypertension, diabetes presents with complaint of fever cough vomiting and generalized weakness which began today suddenly.  [facility] and had urinalysis that was positive for nitrite and leukocytes.  She does have an indwelling Foley catheter.  She was last given Tylenol at 2 PM.  On arrival to the ED she complains of coughing and feeling generally tired and diffuse body aches. There are no other associated systemic symptoms, there are no other alleviating or modifying factors.   Denies abdominal pain  Past Medical History:  Diagnosis Date  . Degenerative arthritis   . Diabetes mellitus without complication ([place])   . Gait disturbance   . Hypertension   . Multiple sclerosis ([place])   . Neuromuscular disorder (HCC)    Bilateral hand carpal tunnel syndrome  . Obesity   . Spinal stenosis in cervical region   . Spinal stenosis of lumbosacral region   . Spinal stenosis, thoracic     Patient Active Problem List   Diagnosis Date Noted  . Decubitus ulcer of buttock, stage 4 ([place]) 11/22/2016  . Sepsis ([place]) 11/22/2016  . Leukocytosis   . Paraplegia ([place]) 06/01/2014  . Closed supracondylar fracture of right femur, periprosthetic 06/01/2014  . Femoral fracture ([place]) 06/01/2014  . Encounter for therapeutic drug monitoring 01/05/2014  . Foot drop, bilateral 10/13/2012  . Abnormality of gait 10/13/2012  . Neurogenic bladder 09/02/2012  . Neurogenic pain of lower extremity 09/02/2012  . Weakness 07/21/2012  . HTN (hypertension) 07/21/2012  . DM (diabetes mellitus) ([place]) 07/21/2012  . HLD (hyperlipidemia) 07/21/2012  . Tachycardia 07/21/2012    . GERD (gastroesophageal reflux disease) 07/21/2012  . Multiple sclerosis ([place])     Past Surgical History:  Procedure Laterality Date  . ABDOMINAL HYSTERECTOMY    . BACK SURGERY    . CATARACT EXTRACTION Right   . CHOLECYSTECTOMY    . FEMUR IM NAIL Right 06/02/2014   Procedure: INTRAMEDULLARY (IM) RETROGRADE FEMORAL NAILING;  Surgeon: Johnny Bridge, MD;  Location: [facility];  Service: Orthopedics;  Laterality: Right;  . left hand carpal tunnel surgery    . REPLACEMENT TOTAL KNEE BILATERAL  2004    OB History    No data available       Home Medications    Prior to Admission medications   Medication Sig Start Date End Date Taking? Authorizing Provider  acetaminophen (TYLENOL) 325 MG tablet Take 650 mg by mouth 2 (two) times daily.    Yes [provider]  amLODipine (NORVASC) 10 MG tablet Take 10 mg by mouth at bedtime.    Yes [provider]  ARTIFICIAL TEAR OP Apply 1 drop to eye 4 (four) times daily as needed (dry eyes).    Yes [provider]  baclofen (LIORESAL) 10 MG tablet 1.5 tablet in the morning, 1 tablet midday, two tablets at night Patient taking differently: Take 10 mg by mouth See admin instructions. 15 mg  tablet in the morning, 10 mg  tablet midday, 20 mg tablets at night 11/29/15  Yes Millikan, Megan, NP  calcium citrate (CALCITRATE - DOSED IN MG ELEMENTAL CALCIUM) 950 MG  tablet Take 200 mg of elemental calcium by mouth daily.   Yes [provider]  cholecalciferol (VITAMIN D) 1000 UNITS tablet Take 1,000 Units by mouth daily.   Yes [provider]  Dentifrices (BIOTENE DRY MOUTH) GEL Take 1 application by mouth 2 (two) times daily.   Yes [provider]  Dimethyl Fumarate (TECFIDERA) 240 MG CPDR Take 1 capsule (240 mg total) by mouth 2 (two) times daily. 03/13/16  Yes Kathrynn Ducking, MD  DULoxetine (CYMBALTA) 60 MG capsule Take 90 mg by mouth daily. Take along with a 30mg    Yes [provider]  feeding  supplement, GLUCERNA SHAKE, (GLUCERNA SHAKE) LIQD Take 1 Can by mouth 2 (two) times daily.   Yes [provider]  gabapentin (NEURONTIN) 300 MG capsule Take 1 capsule (300 mg total) by mouth 3 (three) times daily. Patient taking differently: Take 300 mg by mouth 2 (two) times daily.  11/26/16 08/13/17 Yes Minus Liberty, MD  glimepiride (AMARYL) 2 MG tablet Take 2 mg by mouth daily.    Yes [provider]  insulin glargine (LANTUS) 100 UNIT/ML injection Inject 25 Units into the skin at bedtime.    Yes [provider]  lidocaine (LIDODERM) 5 % Place 1 patch onto the skin daily as needed (pain). Remove & Discard patch within 12 hours or as directed by MD    Yes [provider]  omeprazole (PRILOSEC) 20 MG capsule Take 20 mg by mouth daily.   Yes [provider]  predniSONE (DELTASONE) 5 MG tablet 1 tablet on odd days, one half tablet on even days Patient taking differently: Take 5 mg by mouth See admin instructions. 5 mg  tablet on odd days, 2.5 mg tablet on even days 11/22/14  Yes Kathrynn Ducking, MD  senna-docusate (SENOKOT-S) 8.6-50 MG tablet Take 2 tablets by mouth at bedtime.   Yes [provider]  Skin Protectants, Misc. (EUCERIN) cream Apply 1 application topically 2 (two) times daily.   Yes [provider]  Amino Acids-Protein Hydrolys (FEEDING SUPPLEMENT, PRO-STAT SUGAR FREE 64,) LIQD Take 30 mLs by mouth 2 (two) times daily. Patient not taking: Reported on 08/13/2017 11/26/16   Minus Liberty, MD  Multiple Vitamins-Minerals (MULTIVITAMIN ADULT PO) Take by mouth. One daily    [provider]  Petrolatum-Zinc Oxide (SENSI-CARE PROTECTIVE BARRIER EX) Apply 1 application topically daily as needed (skin).     [provider]  polyethylene glycol (MIRALAX / GLYCOLAX) packet Take 17 g by mouth daily as needed for mild constipation. Patient not taking: Reported on 08/13/2017 06/06/14   Dellia Nims, MD    Family  History Family History  Problem Relation Age of Onset  . Multiple sclerosis Other        neices.   . Cancer Mother   . Diabetes Mother   . GI Bleed Sister        diverticulitis    Social History Social History   Tobacco Use  . Smoking status: Never Smoker  . Smokeless tobacco: Never Used  Substance Use Topics  . Alcohol use: No  . Drug use: No     Allergies   Codeine; Ultram [tramadol]; and Januvia [sitagliptin]   Review of Systems Review of Systems  ROS reviewed and all otherwise negative except for mentioned in HPI   Physical Exam Updated Vital Signs BP 127/79 (BP Location: Right Arm)   Pulse (!) 143   Temp 98 F (36.7 C) (Oral)   Resp 15  SpO2 92%  Vitals reviewed Physical Exam  Physical Examination: General appearance - alert, well appearing, and in no distress Mental status - alert, oriented to person, place, and time Eyes - no conjunctival injection, no scleral icterus Mouth - mucous membranes moist, pharynx normal without lesions Neck - supple, no significant adenopathy Chest - clear to auscultation, no wheezes, rales or rhonchi, symmetric air entry Heart - increased rate, regular rhythm, normal S1, S2, no murmurs, rubs, clicks or gallops Abdomen - soft, nontender, nondistended, no masses or organomegaly Neurological - alert, oriented, normal speech, decreased strength lower extrmities bilaterally Extremities - peripheral pulses normal, no pedal edema, no clubbing or cyanosis Skin - normal coloration and turgor, no rashes   ED Treatments / Results  Labs (all labs ordered are listed, but only abnormal results are displayed) Labs Reviewed  COMPREHENSIVE METABOLIC PANEL - Abnormal; Notable for the following components:      Result Value   Sodium 133 (*)    Potassium 2.3 (*)    Chloride 84 (*)    Glucose, Bld 158 (*)    BUN 5 (*)    Calcium 8.7 (*)    Albumin 2.2 (*)    ALT 13 (*)    Total Bilirubin 2.1 (*)    Anion gap 20 (*)    All other  components within normal limits  CBC WITH DIFFERENTIAL/PLATELET - Abnormal; Notable for the following components:   WBC 27.7 (*)    Hemoglobin 11.4 (*)    HCT 35.1 (*)    RDW 18.3 (*)    Platelets 481 (*)    Neutro Abs 23.8 (*)    Monocytes Absolute 1.7 (*)    All other components within normal limits  URINALYSIS, ROUTINE W REFLEX MICROSCOPIC - Abnormal; Notable for the following components:   Color, Urine AMBER (*)    APPearance CLOUDY (*)    Hgb urine dipstick MODERATE (*)    Ketones, ur 80 (*)    Protein, ur 100 (*)    Nitrite POSITIVE (*)    Leukocytes, UA MODERATE (*)    Bacteria, UA MANY (*)    Squamous Epithelial / LPF 0-5 (*)    All other components within normal limits  CULTURE, BLOOD (ROUTINE X 2)  CULTURE, BLOOD (ROUTINE X 2)  URINE CULTURE  I-STAT CG4 LACTIC ACID, ED  I-STAT CG4 LACTIC ACID, ED    EKG  EKG Interpretation  Date/Time:  Wednesday August 13 2017 18:30:07 EST Ventricular Rate:  138 PR Interval:  136 QRS Duration: 80 QT Interval:  290 QTC Calculation: 439 R Axis:   -47 Text Interpretation:  Sinus tachycardia with occasional Premature ventricular complexes Left axis deviation Septal infarct , age undetermined Possible Lateral infarct , age undetermined Abnormal ECG Since previous tracing QT interval has decreased, PVCs are new Confirmed by Alfonzo Beers [phone]) on 08/13/2017 6:38:18 PM       Radiology Dg Chest 2 View  Result Date: 08/13/2017 CLINICAL DATA:  Fever tachycardia EXAM: CHEST  2 VIEW COMPARISON:  11/22/2016 FINDINGS: Normal cardiac silhouette. Lung volumes are low. No effusion, infiltrate, or pneumothorax. Degenerate changes in the shoulders. IMPRESSION: Low lung volumes.  No acute findings. Electronically Signed   By: Suzy Bouchard M.D.   On: 08/13/2017 20:19    Procedures Procedures (including critical care time)  Medications Ordered in ED Medications  piperacillin-tazobactam (ZOSYN) IVPB 3.375 g (not administered)  sodium  chloride 0.9 % bolus 1,000 mL (not administered)  acetaminophen (TYLENOL) tablet 1,000 mg (1,000  mg Oral Given 08/13/17 1951)  sodium chloride 0.9 % bolus 1,000 mL (1,000 mLs Intravenous New Bag/Given 08/13/17 1952)  ondansetron (ZOFRAN) injection 4 mg (4 mg Intravenous Given 08/13/17 1951)  vancomycin (VANCOCIN) IVPB 1000 mg/200 mL premix (1,000 mg Intravenous New Bag/Given 08/13/17 1951)  potassium chloride 10 mEq in 100 mL IVPB (10 mEq Intravenous New Bag/Given 08/13/17 1951)  potassium chloride SA (K-DUR,KLOR-CON) CR tablet 40 mEq (40 mEq Oral Given 08/13/17 1951)     Initial Impression / Assessment and Plan / ED Course  I have reviewed the triage vital signs and the nursing notes.  Pertinent labs & imaging results that were available during my care of the patient were reviewed by me and considered in my medical decision making (see chart for details).    8:32 PM  HR 130s- pt states her HR is always elevated above 100 at her baseline.    Patient presenting with fever cough congestion and vomiting which began today.  She has an indwelling Foley and her urine appears consistent with UTI.  Blood and urine cultures were sent.  She is febrile and tachycardic on arrival.  IV fluids were started as well as broad-spectrum antibiotics.  She is not hypotensive and her lactate is normal.  Discussed with internal medicine teaching service for admission and they will see patient in the ED  Final Clinical Impressions(s) / ED Diagnoses   Final diagnoses:  Febrile illness  Urinary tract infection without hematuria, site unspecified    ED Discharge Orders    None       Pixie Casino, MD 08/13/17 2140

## 2017-08-13 NOTE — ED Notes (Signed)
Updated [facility] RN at this time.

## 2017-08-13 NOTE — H&P (Signed)
Date: 08/13/2017               Patient Name:  Laura Roberts MRN: [id number]  DOB: [birth year] Age / Sex: 77 y.o., female   PCP: [name], MD         Medical Service: Internal Medicine Teaching Service         Attending Physician: Dr. Annia Belt, MD    First Contact: Dr. Ronalee Red Pager: [phone]  Second Contact: Dr. Philipp Ovens Pager: [phone]       After Hours (After 5p/  First Contact Pager: [phone]  weekends / holidays): Second Contact Pager: [phone]   Chief Complaint: Vomiting, nausea and catheter pain  History of Present Illness: Laura Roberts is a pleasant 74 yoF who presents with a one day history of nausea, vomiting, and leg pain after catheter replacement. She has a PMHx notable for multiple sclerosis, degenerative arthritis, insulin dependent DMII, HTN, spinal stenosis cervical through lumbar. She stated that she was in her usual state of health until this am when she became nauseated and began vomiting. To complicate matters, she was constipated but responded excessively to her laxative treatment and began to develop loose watery bowel movements. Nursing staff at [facility] noted that her urine was discolored prompting them to change her catheter which consequently resulted in left groin pain, which she believes to be referred pain from the catheter. The pain resolved as did the vomiting with her cessation of PO intake. The nausea persisted and she noted gastric reflux. She attested to increased urinary frequency as well.   Patient denied diaphoresis, headache, visual changes, neck pain, rhinorrhea, chest pain, abdominal pain or back pain. She attested to fever, chills, chronic stable myalgias, and new onset cough with green sputum.   In the ED, she a UA indicated UTI w/ being positive for protein, nitrites, leukocytes, and bacteria. Urine culture was ordered and is collected. I-stat lactic acid was negative at 1.46, CBC w/ diff showing a left shift with  27.7K/uL WBC's (patient on long term corticosteroids), and mild normocytic anemia of 11.4. CMP indicated a sever hypokalemia of 2.3 replaced with one run of IV and 80mEq PO potassium. She was placed on vancomycin and ceftriaxone for her UTI. CXR was unremarkable with regard to her presentation as was her EKG. A dose of ondansetron minimally improved her symptoms.   Meds:  Current Meds  Medication Sig  . acetaminophen (TYLENOL) 325 MG tablet Take 650 mg by mouth 2 (two) times daily.   Marland Kitchen amLODipine (NORVASC) 10 MG tablet Take 10 mg by mouth at bedtime.   . ARTIFICIAL TEAR OP Apply 1 drop to eye 4 (four) times daily as needed (dry eyes).   . baclofen (LIORESAL) 10 MG tablet 1.5 tablet in the morning, 1 tablet midday, two tablets at night (Patient taking differently: Take 10 mg by mouth See admin instructions. 15 mg  tablet in the morning, 10 mg  tablet midday, 20 mg tablets at night)  . calcium citrate (CALCITRATE - DOSED IN MG ELEMENTAL CALCIUM) 950 MG tablet Take 200 mg of elemental calcium by mouth daily.  . cholecalciferol (VITAMIN D) 1000 UNITS tablet Take 1,000 Units by mouth daily.  . Dentifrices (BIOTENE DRY MOUTH) GEL Take 1 application by mouth 2 (two) times daily.  . Dimethyl Fumarate (TECFIDERA) 240 MG CPDR Take 1 capsule (240 mg total) by mouth 2 (two) times daily.  . DULoxetine (CYMBALTA) 60 MG capsule Take 90 mg by mouth  daily. Take along with a 30mg   . feeding supplement, GLUCERNA SHAKE, (GLUCERNA SHAKE) LIQD Take 1 Can by mouth 2 (two) times daily.  Marland Kitchen gabapentin (NEURONTIN) 300 MG capsule Take 1 capsule (300 mg total) by mouth 3 (three) times daily. (Patient taking differently: Take 300 mg by mouth 2 (two) times daily. )  . glimepiride (AMARYL) 2 MG tablet Take 2 mg by mouth daily.   . insulin glargine (LANTUS) 100 UNIT/ML injection Inject 25 Units into the skin at bedtime.   . lidocaine (LIDODERM) 5 % Place 1 patch onto the skin daily as needed (pain). Remove & Discard patch within  12 hours or as directed by MD   . omeprazole (PRILOSEC) 20 MG capsule Take 20 mg by mouth daily.  . predniSONE (DELTASONE) 5 MG tablet 1 tablet on odd days, one half tablet on even days (Patient taking differently: Take 5 mg by mouth See admin instructions. 5 mg  tablet on odd days, 2.5 mg tablet on even days)  . senna-docusate (SENOKOT-S) 8.6-50 MG tablet Take 2 tablets by mouth at bedtime.  . Skin Protectants, Misc. (EUCERIN) cream Apply 1 application topically 2 (two) times daily.   Allergies: Allergies as of 08/13/2017 - Review Complete 08/13/2017  Allergen Reaction Noted  . Codeine Other (See Comments) 07/21/2012  . Ultram [tramadol] Other (See Comments) 07/21/2012  . Januvia [sitagliptin] Rash 06/03/2016   Past Medical History:  Diagnosis Date  . Degenerative arthritis   . Diabetes mellitus without complication ([place])   . Gait disturbance   . Hypertension   . Multiple sclerosis ([place])   . Neuromuscular disorder (HCC)    Bilateral hand carpal tunnel syndrome  . Obesity   . Spinal stenosis in cervical region   . Spinal stenosis of lumbosacral region   . Spinal stenosis, thoracic    Family History:  Family History  Problem Relation Age of Onset  . Multiple sclerosis Other        neices.   . Cancer Mother   . Diabetes Mother   . GI Bleed Sister        diverticulitis   Social History:  Social History   Tobacco Use  . Smoking status: Never Smoker  . Smokeless tobacco: Never Used  Substance Use Topics  . Alcohol use: No  . Drug use: No   Review of Systems: A complete ROS was negative except as per HPI.   Physical Exam: Blood pressure 120/71, pulse (!) 125, temperature 98 F (36.7 C), temperature source Oral, resp. rate (!) 21, SpO2 96 %. Physical Exam  Constitutional: She is oriented to person, place, and time. She appears well-developed and well-nourished. No distress.  HENT:  Head: Normocephalic and atraumatic.  Eyes: Conjunctivae and EOM are normal. Pupils are  equal, round, and reactive to light.  Neck: Normal range of motion. Neck supple.  Cardiovascular: Normal rate and regular rhythm.  No murmur heard. Pulmonary/Chest: Effort normal and breath sounds normal. No stridor. No respiratory distress.  Abdominal: Soft. Bowel sounds are normal. She exhibits no distension. There is no tenderness.  Genitourinary:  Genitourinary Comments: Foley catheter in position  Musculoskeletal: She exhibits deformity (Of the toes, 2/2 arthritis ). She exhibits no edema or tenderness.  Neurological: She is alert and oriented to person, place, and time. She displays tremor (Right>left foot).  Patient unable to stand or sit without assistance. Mechanical wheelchair in room.   Skin: Skin is warm. Capillary refill takes less than 2 seconds. She is not diaphoretic.  Psychiatric: She has a normal mood and affect.  Nursing note and vitals reviewed.  EKG: personally reviewed my interpretation is sinus tachycardia with PVC's and possible lateral infarct. No ACS consistent ST abnormalities.  CXR: personally reviewed my interpretation is there are no focal opacities, the left diaphragmatic border is slightly obscured. No acute cardiopulmonary process noted. No evidence of edema of pulmonary effusion.   Assessment & Plan by Problem: Active Problems:   UTI (urinary tract infection) due to urinary indwelling catheter (HCC)   Multiple sclerosis (HCC)   HTN (hypertension)   DM (diabetes mellitus) ([place])  Assessment: 51 yoF who presented with N/V/D and left groin pain with a known Hx of MS and neurogenic bladder. UA was highly positive for UTI which is consistent with her symptoms and the notable discoloration of the patients urine that prompted the catheter change [facility]. Given chronic medical conditions and immunosuppression with corticosteroids she was admitted for treatment of her UTI with IV antibiotics. In addition, it was observed that her potassium was 2.36mmol/L  which will be treated and monitored as well. Gastric reflux resolved.   UTI:   UA w/ +leuks, bacteria and nitrites with patient being febrile and having discolored urine. The patient stated that she does not routinely develop UTI's nor does she often experience pain in the genitourinary area due to decreased sensation which she attributes to her MS. -Ondansetron 4mg  PRN for nausea, QTc not prolonged -Received vanc and ceftriaxone in the ED -Will continue ceftriaxone 1 gram daily until speciation of the infectious organism results -Urine Cx pending -Blood Cx pending  Hypokalemia: Not formerly a chronic issue per chart review. Most likely 2/2 to her acute illness and GI fluid loss.  -Magnesium ordered -Ordered IV 37mEq over four hours  -Ordered PO solution 36mEq -Ordered IV fluids 176ml/hr with 30mEq per liter -Given PO 60mEq and IV 51mEq  -BMP at 5am  Multiple Sclerosis: Patient diagnosed in 2007 w/ MS and paraplegia, mild aphasia and neurogenic bladder. She experienced a former ischial pressure ulcer in 10/04/2016.  -Ordered home Dimethyl fumarate (Tecfidera) 240mg  BID, but as per patient her daughter is going to provide her home medication as the pharmacy at [facility] and [facility] dose not carry this.  -Continue prednisone 5mg  daily -Fall precautions  -Continue Gabapentin 300mg  BID for neuropathic pain -Continue Calcium citrate supplement -Continue baclofen for muscle spasms  -Continue Vit D supplement   GERD: Gastric reflux symptoms resolved. -Continue home omeprazole 20mg  PO daily or equivalent   MDD: No acute issues noted. Denied thoughts of self harm -Continue Duloxetine 90mg  PO daily  DMII, insulin dependent: No acute or active issues. Serum glucose was 158 on admission. -Holding Glimepiride 2mg  tablet -Continue Lantus but at decreased dose of 15Units from 25units due to electively NPO -SSI sensitive TID w/ meals  HTN: Patient BP low on this admission most likely from volume  loss.  -Holding Amlodipine 10mg  daily  Diet: heart healthy Code: DNR Fluids: Limit to IV medications Dvt PPX: enoxaparin  Dispo: Admit patient to Inpatient with expected length of stay greater than 2 midnights.  Signed: Kathi Ludwig, MD 08/13/2017, 10:52 PM  Pager: Pager# [phone]

## 2017-08-13 NOTE — ED Triage Notes (Signed)
[facility] with UA positive for UTI and emesis and fever; pt currently wheelchair bound as result of MS and uses indwelling foley for urine elimination

## 2017-08-14 ENCOUNTER — Encounter (HOSPITAL_COMMUNITY): Payer: Self-pay | Admitting: *Deleted

## 2017-08-14 ENCOUNTER — Other Ambulatory Visit: Payer: Self-pay

## 2017-08-14 DIAGNOSIS — N319 Neuromuscular dysfunction of bladder, unspecified: Secondary | ICD-10-CM

## 2017-08-14 DIAGNOSIS — M4802 Spinal stenosis, cervical region: Secondary | ICD-10-CM

## 2017-08-14 DIAGNOSIS — G8220 Paraplegia, unspecified: Secondary | ICD-10-CM

## 2017-08-14 DIAGNOSIS — K592 Neurogenic bowel, not elsewhere classified: Secondary | ICD-10-CM

## 2017-08-14 DIAGNOSIS — Z833 Family history of diabetes mellitus: Secondary | ICD-10-CM

## 2017-08-14 DIAGNOSIS — Z888 Allergy status to other drugs, medicaments and biological substances status: Secondary | ICD-10-CM

## 2017-08-14 DIAGNOSIS — E119 Type 2 diabetes mellitus without complications: Secondary | ICD-10-CM

## 2017-08-14 DIAGNOSIS — Z993 Dependence on wheelchair: Secondary | ICD-10-CM

## 2017-08-14 DIAGNOSIS — Z79899 Other long term (current) drug therapy: Secondary | ICD-10-CM

## 2017-08-14 DIAGNOSIS — F339 Major depressive disorder, recurrent, unspecified: Secondary | ICD-10-CM

## 2017-08-14 DIAGNOSIS — Z8269 Family history of other diseases of the musculoskeletal system and connective tissue: Secondary | ICD-10-CM

## 2017-08-14 DIAGNOSIS — N390 Urinary tract infection, site not specified: Secondary | ICD-10-CM

## 2017-08-14 DIAGNOSIS — Z96653 Presence of artificial knee joint, bilateral: Secondary | ICD-10-CM

## 2017-08-14 DIAGNOSIS — T83511A Infection and inflammatory reaction due to indwelling urethral catheter, initial encounter: Principal | ICD-10-CM

## 2017-08-14 DIAGNOSIS — M4804 Spinal stenosis, thoracic region: Secondary | ICD-10-CM

## 2017-08-14 DIAGNOSIS — G8929 Other chronic pain: Secondary | ICD-10-CM

## 2017-08-14 DIAGNOSIS — I10 Essential (primary) hypertension: Secondary | ICD-10-CM

## 2017-08-14 DIAGNOSIS — Z794 Long term (current) use of insulin: Secondary | ICD-10-CM

## 2017-08-14 DIAGNOSIS — R1032 Left lower quadrant pain: Secondary | ICD-10-CM

## 2017-08-14 DIAGNOSIS — M4807 Spinal stenosis, lumbosacral region: Secondary | ICD-10-CM

## 2017-08-14 DIAGNOSIS — Z960 Presence of urogenital implants: Secondary | ICD-10-CM

## 2017-08-14 DIAGNOSIS — R509 Fever, unspecified: Secondary | ICD-10-CM

## 2017-08-14 DIAGNOSIS — B9689 Other specified bacterial agents as the cause of diseases classified elsewhere: Secondary | ICD-10-CM

## 2017-08-14 DIAGNOSIS — M2061 Acquired deformities of toe(s), unspecified, right foot: Secondary | ICD-10-CM

## 2017-08-14 DIAGNOSIS — E876 Hypokalemia: Secondary | ICD-10-CM

## 2017-08-14 DIAGNOSIS — Z885 Allergy status to narcotic agent status: Secondary | ICD-10-CM

## 2017-08-14 DIAGNOSIS — G35 Multiple sclerosis: Secondary | ICD-10-CM

## 2017-08-14 DIAGNOSIS — R251 Tremor, unspecified: Secondary | ICD-10-CM

## 2017-08-14 DIAGNOSIS — M2062 Acquired deformities of toe(s), unspecified, left foot: Secondary | ICD-10-CM

## 2017-08-14 DIAGNOSIS — K219 Gastro-esophageal reflux disease without esophagitis: Secondary | ICD-10-CM

## 2017-08-14 DIAGNOSIS — Z872 Personal history of diseases of the skin and subcutaneous tissue: Secondary | ICD-10-CM

## 2017-08-14 DIAGNOSIS — R4701 Aphasia: Secondary | ICD-10-CM

## 2017-08-14 DIAGNOSIS — Z66 Do not resuscitate: Secondary | ICD-10-CM

## 2017-08-14 LAB — MAGNESIUM: MAGNESIUM: 1.4 mg/dL — AB (ref 1.7–2.4)

## 2017-08-14 LAB — BASIC METABOLIC PANEL
Anion gap: 16 — ABNORMAL HIGH (ref 5–15)
Anion gap: 14 (ref 5–15)
BUN: 5 mg/dL — AB (ref 6–20)
CHLORIDE: 90 mmol/L — AB (ref 101–111)
CO2: 30 mmol/L (ref 22–32)
CO2: 29 mmol/L (ref 22–32)
CREATININE: 0.79 mg/dL (ref 0.44–1.00)
Calcium: 8 mg/dL — ABNORMAL LOW (ref 8.9–10.3)
Calcium: 8.4 mg/dL — ABNORMAL LOW (ref 8.9–10.3)
Chloride: 93 mmol/L — ABNORMAL LOW (ref 101–111)
Creatinine, Ser: 0.63 mg/dL (ref 0.44–1.00)
GFR calc Af Amer: >60 mL/min (ref >60)
GFR calc non Af Amer: >60 mL/min (ref >60)
GLUCOSE: 213 mg/dL — AB (ref 65–99)
GLUCOSE: 206 mg/dL — AB (ref 65–99)
POTASSIUM: 3.8 mmol/L (ref 3.5–5.1)
Potassium: 2.8 mmol/L — ABNORMAL LOW (ref 3.5–5.1)
Sodium: 135 mmol/L (ref 135–145)
Sodium: 137 mmol/L (ref 135–145)

## 2017-08-14 LAB — CBC
HCT: 33.6 % — ABNORMAL LOW (ref 36.0–46.0)
Hemoglobin: 10.9 g/dL — ABNORMAL LOW (ref 12.0–15.0)
MCH: 26.7 pg (ref 26.0–34.0)
MCHC: 32.4 g/dL (ref 30.0–36.0)
MCV: 82.4 fL (ref 78.0–100.0)
PLATELETS: 439 10*3/uL — AB (ref 150–400)
RBC: 4.08 MIL/uL (ref 3.87–5.11)
RDW: 18.4 % — ABNORMAL HIGH (ref 11.5–15.5)
WBC: 24.2 10*3/uL — ABNORMAL HIGH (ref 4.0–10.5)

## 2017-08-14 LAB — GLUCOSE, CAPILLARY
GLUCOSE-CAPILLARY: 217 mg/dL — AB (ref 65–99)
Glucose-Capillary: 221 mg/dL — ABNORMAL HIGH (ref 65–99)
Glucose-Capillary: 217 mg/dL — ABNORMAL HIGH (ref 65–99)
Glucose-Capillary: 157 mg/dL — ABNORMAL HIGH (ref 65–99)

## 2017-08-14 LAB — INFLUENZA PANEL BY PCR (TYPE A & B)
INFLAPCR: NEGATIVE
INFLBPCR: NEGATIVE

## 2017-08-14 LAB — MRSA PCR SCREENING: MRSA BY PCR: NEGATIVE

## 2017-08-14 MED ORDER — JUVEN PO PACK
2.0000 | PACK | Freq: Every day | ORAL | Status: DC
  Administered 2017-08-14 – 2017-08-17 (×3): 2 via ORAL
  Filled 2017-08-14 (×5): qty 2

## 2017-08-14 MED ORDER — ADULT MULTIVITAMIN W/MINERALS CH
1.0000 | ORAL_TABLET | Freq: Every day | ORAL | Status: DC
  Administered 2017-08-14 – 2017-08-18 (×5): 1 via ORAL
  Filled 2017-08-14 (×5): qty 1

## 2017-08-14 MED ORDER — MAGNESIUM SULFATE 2 GM/50ML IV SOLN
2.0000 g | Freq: Once | INTRAVENOUS | Status: AC
  Administered 2017-08-14: 2 g via INTRAVENOUS
  Filled 2017-08-14: qty 50

## 2017-08-14 MED ORDER — PREMIER PROTEIN SHAKE: 11.0000 [oz_av] | Freq: Every day | ORAL | Status: DC

## 2017-08-14 NOTE — Progress Notes (Signed)
[facility]  Patient Status: Active (receiving services up to time of hospitalization)  [facility] is providing the following services: RN  If patient discharges after hours, please call [phone].   Laura Roberts 08/14/2017, 10:59 AM

## 2017-08-14 NOTE — Progress Notes (Signed)
Medicine attending: I examined this patient today together with resident physician Dr. Colbert Ewing and I concur with her evaluation and management plan which we discussed together.  Please see separate attending admission note for complete details. Patient admitted with urosepsis related to an indwelling Foley catheter.  She has multiple sclerosis, lower extremity paraplegia, a neurogenic bladder.  She presented with nausea, vomiting, fever, with findings of leukocytosis and a grossly infected urine. She is currently receiving ceftriaxone.  Fluid and electrolyte replacement. Continue current management plan.

## 2017-08-14 NOTE — Plan of Care (Signed)
  Education: Knowledge of General Education information will improve 08/14/2017 0123 - Progressing by Tish Frederickson, [facility]/Discharge Planning: Ability to manage health-related needs will improve 08/14/2017 0123 - Progressing by Tish Frederickson, RN   Clinical Measurements: Ability to maintain clinical measurements within normal limits will improve 08/14/2017 0123 - Progressing by Tish Frederickson, RN Will remain free from infection 08/14/2017 0123 - Not Progressing by Tish Frederickson, RN Diagnostic test results will improve 08/14/2017 0123 - Progressing by Tish Frederickson, RN Respiratory complications will improve 08/14/2017 0123 - Progressing by Tish Frederickson, RN Cardiovascular complication will be avoided 08/14/2017 0123 - Progressing by Tish Frederickson, RN

## 2017-08-14 NOTE — Progress Notes (Signed)
Initial Nutrition Assessment  DOCUMENTATION CODES:   Obesity unspecified  INTERVENTION:  Glucerna Shake po BID, each supplement provides 220 kcal and 10 grams of protein  Continue Prostat 62mL BID, each supplement provides 100 calories and 15gm protein  MVI w/ Minerals  Juven 2 pkts daily, with lunch, Provides 160 calories and 28gm amino acids   NUTRITION DIAGNOSIS:   Inadequate oral intake related to poor appetite as evidenced by per patient/family report  GOAL:   Patient will meet greater than or equal to 90% of their needs  MONITOR:   PO intake, I & O's, Labs, Supplement acceptance, Weight trends  REASON FOR ASSESSMENT:   Low [place]    ASSESSMENT:   Ms. Cienfuegos is a 77 yo female with PMH of MS, IDDM, HTN, who presents with nausea, vomiting, and leg pain after catheter replacement. She was also constipated, but has begun having loose watery bowel movements after laxative treatment. New UTI.  Spoke with Ms. Woessner at bedside. She reports having decreased appetite over a couple months. Was eating "maybe 1 meal per day." [facility] and will eat whatever is served there or will cook at home, but states she only "eats a few bites." She is unsure of usual body weight, but states her weight goes up and and down. Believes UBW to be 179 pounds, was weighed in ED at 190 pounds. Ate a few bites of [place] and mashed potatoes for lunch.   Labs reviewed:  CBGs 217, 221, 191 Mg 1.4, TBili 2.1  Medications reviewed and include:  Calcitrate Vitamin D Insulin Prednisone,   NUTRITION - FOCUSED PHYSICAL EXAM:    Most Recent Value  Orbital Region  No depletion  Upper Arm Region  No depletion  Thoracic and Lumbar Region  No depletion  Buccal Region  No depletion  Temple Region  No depletion  Clavicle Bone Region  No depletion  Clavicle and Acromion Bone Region  No depletion  Scapular Bone Region  No depletion  Dorsal Hand  No depletion  Patellar Region  No depletion   Anterior Thigh Region  No depletion  Posterior Calf Region  No depletion  Edema (RD Assessment)  None  Hair  Reviewed  Eyes  Reviewed  Mouth  Reviewed  Skin  Reviewed  Nails  Reviewed       Diet Order:  Diet heart healthy/carb modified Room service appropriate? Yes; Fluid consistency: Thin  EDUCATION NEEDS:   Not appropriate for education at this time  Skin:  Skin Assessment: Skin Integrity Issues: Skin Integrity Issues:: Stage IV Stage IV: To buttocks  Last BM:  08/14/2017  Height:   Ht Readings from Last 1 Encounters:  03/06/17 5\' 2"  (1.575 m)    Weight:   Wt Readings from Last 1 Encounters:  08/13/17 190 lb 4.1 oz (86.3 kg)    Ideal Body Weight:  50 kg  BMI:  Body mass index is 34.8 kg/m.  Estimated Nutritional Needs:   Kcal:  1600-1900 calories  Protein:  85-100 grams  Fluid:  1.6-1.9L  Satira Anis. Adelyna Brockman, MS, RD LDN Inpatient Clinical Dietitian Pager [phone]

## 2017-08-14 NOTE — Progress Notes (Signed)
Subjective:  Laura Roberts was seen lying in bed comfortably this morning. She states that she feels better. Denies shortness of breath, chest pain, N/V, or cough. She also states that she is unable to feel her groin area (chronic) and that the only symptom she has is left groin pain. She is usually unable to tell whether she is having a UTI. Spoke to Dr. Bradd Burner (patient's PCP) over phone and he will follow along with [place] while she is in the hospital - he recommended checking for the flu.  Objective:  Vital signs in last 24 hours: Vitals:   08/13/17 2350 08/14/17 0232 08/14/17 0501 08/14/17 1010  BP: 116/67 (!) 113/58 (!) 144/74 122/66  Pulse: (!) 122 (!) 133 (!) 136 (!) 136  Resp:  20 20 20   Temp: 99 F (37.2 C) 98.7 F (37.1 C) 98.7 F (37.1 C) (!) 101.8 F (38.8 C)  TempSrc: Oral Oral Oral Tympanic  SpO2: 97% 97% 98% 94%  Weight: 190 lb 4.1 oz (86.3 kg)      GEN: Well-nourished female lying in bed. Alert and oriented. No acute distress.  HENT: [place]/AT. Moist mucous membranes. No visible lesions. EYES: PERRL. Sclera non-icteric. Conjunctiva clear. RESP: Clear to auscultation bilaterally. No wheezes, rales, or rhonchi. No increased work of breathing. CV: Tachycardic and regular rhythm. No murmurs, gallops, or rubs. Trace LE edema. ABD: Soft. Non-tender. Non-distended. Normoactive bowel sounds. EXT: Trace LE edema. Skin is very warm. GU: Foley catheter in place NEURO: Cranial nerves II-XII grossly intact. No sensation in BLE and 0 strength in BLE. +spasms in bilateral feet, R>L. Able to lift all four extremities against gravity. No apparent audiovisual hallucinations. Speech fluent and appropriate. PSYCH: Patient is calm and pleasant. Appropriate affect. Well-groomed; speech is appropriate and on-subject.  Assessment/Plan:  Principal Problem:   UTI (urinary tract infection) due to urinary indwelling Foley catheter ([place]) Active Problems:   Multiple sclerosis (HCC)   HTN  (hypertension)   DM (diabetes mellitus) ([place])   GERD (gastroesophageal reflux disease)   Neurogenic bladder  [name] is a 77yo female with PMH of MS (with paraplegia, neurogenic bowel and bladder, with chronic indwelling catheter), T2DM, HTN, depression, and degenerative arthritis who presented with acute onset of N/V/D, left groin pain, and fever to 101.2. Also noted to have WBC 27 and hypokalemia to 2.3 on admission. UA positive for UTI, currently treating with IV ceftriaxone. Improved in her symptoms this morning.  UTI with chronic indwelling catheter Managing with IV ceftriaxone. BCx and UCx pending. Spiked another fever to 101.8 this morning and still tachycardic. - Ondansetron 4mg  PRN for nausea - Continue ceftriaxone 1 gram daily - F/u Urine Cx pending - F/u Blood Cx 2/13 ->  - Continue to monitor fever curve  Fever to 101.8 Given the acute onset of systemic symptoms, we will also check for influenza. She is febrile this morning, although her symptoms have improved. Satting well on RA and still tachycardic. - F/u flu panel - Continue to monitor fever curve - Droplet precautions  Hypokalemia, resolved Hypomagnesemia K 2.3 on admission, which has improved to 3.8 s/p IV and PO repletion. Mg also 1.4, repleted. Will continue to monitor. - BMP in AM  Multiple sclerosis Diagnosed in 2007 with MS, with paraplegia, neurogenic bladder with chronic indwelling catheter, and neurogenic bowel. - Continue home dimethyl fumarate 240mg  BID (patient will take home medication) - Continue home prednisone 5mg  on odd days, 2.5mg  on even days - Fall precautions - Continue home Gabapentin  300mg  BID for neuropathic pain - Continue Calcium citrate supplement - Continue baclofen for muscle spasms  - Continue Vit D supplement   GERD - Continue pantoprazole 40mg  daily  MDD No acute issues noted. Denied thoughts of self harm - Continue Duloxetine 90mg  PO daily  DMII, insulin dependent No  acute or active issues. Serum glucose in 200s. Home regimen includes glimepiride 2mg  daily and lantus 25u QHS. - Hold glimepiride - Continue lantus 15u QHS - SSI sensitive TID w/ meals - CBG monitoring  HTN Currently normotensive. 122/66. Home regimen includes amlodipine 10mg  daily. Will continue holding home medication. - Can restart amlodipine 10mg  daily if becomes hypertensive  Dispo: Anticipated discharge in approximately 2-3 day(s).   Laura Ewing, MD 08/14/2017, 10:30 AM Pager: Mamie Nick [phone]

## 2017-08-15 DIAGNOSIS — R000 Tachycardia, unspecified: Secondary | ICD-10-CM

## 2017-08-15 LAB — GLUCOSE, CAPILLARY
GLUCOSE-CAPILLARY: 334 mg/dL — AB (ref 65–99)
GLUCOSE-CAPILLARY: 351 mg/dL — AB (ref 65–99)
Glucose-Capillary: 150 mg/dL — ABNORMAL HIGH (ref 65–99)
Glucose-Capillary: 216 mg/dL — ABNORMAL HIGH (ref 65–99)

## 2017-08-15 LAB — TSH: TSH: 0.052 u[IU]/mL — AB (ref 0.350–4.500)

## 2017-08-15 LAB — T4, FREE: FREE T4: 1.71 ng/dL — AB (ref 0.61–1.12)

## 2017-08-15 NOTE — Discharge Summary (Signed)
Name: Laura Roberts MRN: [id number] DOB: [birth year] 77 y.o. PCP: Laura Adie, MD  Date of Admission: 08/13/2017  6:31 PM Date of Discharge: 08/18/2017 Attending Physician: Laura Crews, MD  Discharge Diagnosis: 1. Complicated UTI 2. Tachycardia 3. HTN   Principal Problem:   UTI (urinary tract infection) due to urinary indwelling Foley catheter (HCC) Active Problems:   Multiple sclerosis (HCC)   HTN (hypertension)   DM (diabetes mellitus) (HCC)   GERD (gastroesophageal reflux disease)   Neurogenic bladder   Febrile illness   Discharge Medications: Allergies as of 08/18/2017      Reactions   Codeine Other (See Comments)   Difficult breathing and skin problem   Ultram [tramadol] Other (See Comments)   Difficult breathing and skin peeling   Januvia [sitagliptin] Rash   Blisters      Medication List    STOP taking these medications   polyethylene glycol packet Commonly known as:  MIRALAX / GLYCOLAX     TAKE these medications   acetaminophen 325 MG tablet Commonly known as:  TYLENOL Take 650 mg by mouth 2 (two) times daily.   amLODipine 10 MG tablet Commonly known as:  NORVASC Take 10 mg by mouth at bedtime.   amoxicillin-clavulanate 500-125 MG tablet Commonly known as:  AUGMENTIN Take 1 tablet (500 mg total) by mouth 2 (two) times daily for 3 doses.   ARTIFICIAL TEAR OP Apply 1 drop to eye 4 (four) times daily as needed (dry eyes).   baclofen 10 MG tablet Commonly known as:  LIORESAL 1.5 tablet in the morning, 1 tablet midday, two tablets at night What changed:    how much to take  how to take this  when to take this  additional instructions   BIOTENE DRY MOUTH Gel Take 1 application by mouth 2 (two) times daily.   calcium citrate 950 MG tablet Commonly known as:  CALCITRATE - dosed in mg elemental calcium Take 200 mg of elemental calcium by mouth daily.   cholecalciferol 1000 units tablet Commonly known as:  VITAMIN D Take  1,000 Units by mouth daily.   Dimethyl Fumarate 240 MG Cpdr Commonly known as:  TECFIDERA Take 1 capsule (240 mg total) by mouth 2 (two) times daily.   DULoxetine 60 MG capsule Commonly known as:  CYMBALTA Take 90 mg by mouth daily. Take along with a 30mg    eucerin cream Apply 1 application topically 2 (two) times daily.   feeding supplement (GLUCERNA SHAKE) Liqd Take 1 Can by mouth 2 (two) times daily.   feeding supplement (PRO-STAT SUGAR FREE 64) Liqd Take 30 mLs by mouth 2 (two) times daily.   gabapentin 300 MG capsule Commonly known as:  NEURONTIN Take 1 capsule (300 mg total) by mouth 2 (two) times daily.   glimepiride 2 MG tablet Commonly known as:  AMARYL Take 2 mg by mouth daily.   insulin glargine 100 UNIT/ML injection Commonly known as:  LANTUS Inject 25 Units into the skin at bedtime.   lidocaine 5 % Commonly known as:  LIDODERM Place 1 patch onto the skin daily as needed (pain). Remove & Discard patch within 12 hours or as directed by MD   MULTIVITAMIN ADULT PO Take by mouth. One daily   omeprazole 20 MG capsule Commonly known as:  PRILOSEC Take 20 mg by mouth daily.   predniSONE 5 MG tablet Commonly known as:  DELTASONE 1 tablet on odd days, one half tablet on even days What changed:  how much to take  how to take this  when to take this  additional instructions   senna-docusate 8.6-50 MG tablet Commonly known as:  Senokot-S Take 2 tablets by mouth at bedtime.       Disposition and follow-up:   LauraLaura Roberts was discharged from [facility] in Stable condition.  At the hospital follow up visit please address:  1.  UTI: Was she able to complete course of antibiotics? Has she had recurrent fever/pain/nausea?  2.  Tachycardia: She was noted to be tachycardic to the 130s on admission, which improved back to her baseline of 100-110 on discharge with treatment of her UTI, which was likely contributing. Low TSH, high  free T4, TRAb negative. In the setting of acute illness, these may not be accurate. Recommend TSH re-check in ~4 weeks.  3.  Labs / imaging needed at time of follow-up: TSH in 4 weeks  4.  Pending labs/ test needing follow-up: BCx  Follow-up Appointments: Follow-up Information    Laura Adie, MD. Schedule an appointment as soon as possible for a visit in 1 week(s).   Specialty:  Family Medicine Contact information: [place] [address] [facility] Course by problem list: Principal Problem:   UTI (urinary tract infection) due to urinary indwelling Foley catheter [facility]) Active Problems:   Multiple sclerosis (HCC)   HTN (hypertension)   DM (diabetes mellitus) ([place])   GERD (gastroesophageal reflux disease)   Neurogenic bladder   Febrile illness   1. Complicated UTI in setting of urinary indwelling Foley catheter 2/2 neurogenic bladder from MS Laura Roberts was admitted on 2/13 with subjective fevers, chills, N/V, and left groin pain. She was febrile to 101.2 and tachycardic, and had a leukocytosis to 28K. CXR on admission without evidence of consolidation. Flu negative. BCx 2/13 NGTD x4d. UA positive for too numerous to count WBC, positive nitrites, and many bacteria. She received a dose of vanc/zosyn in the ED on 2/13, which was transitioned to IV ceftriaxone on 2/14. She continued to be intermittently febrile and tachycardic for several more days, but clinically improved. UCx returned with Klebsiella pneumoniae and E. Coli, and antibiotics were narrowed to Augmentin based on culture sensitivities. She was discharged to complete 7 total days of antibiotics (2/13 -> 2/19).  2. Tachycardia to 130s Noted to be tachycardic on previous admissions in the 100s. EKG showed sinus tachycardia. Her tachycardia improved from 130s to 110s with treatment of UTI, which was likely contributing. She denied palpitations or chest pain. TSH low, Free T4 high, T3 low.  TRAb normal. In the setting of acute illness, TFTs may be falsely abnormal, thus recommend f/u labs in 4 weeks.   3. Hypokalemia, hypomagnesemia Noted to be hypokalemic to 2.3 on admission, Mg 1.4. She received IV Mg and PO and IV repletion of potassium, with improvement in electrolytes.  4. HTN She was normotensive on admission, so home amlodipine was held. This was resumed on discharge.  5. MS Home medications were continued.  Discharge Vitals:   BP (!) 135/58 (BP Location: Right Arm)   Pulse (!) 115   Temp 98.3 F (36.8 C) (Oral)   Resp 20   Wt 190 lb 4.1 oz (86.3 kg)   SpO2 98%   BMI 34.80 kg/m   Pertinent Labs, Studies, and Procedures:  CBC Latest Ref Rng & Units 08/18/2017 08/14/2017 08/13/2017  WBC 4.0 - 10.5 K/uL 13.3(H)  24.2(H) 27.7(H)  Hemoglobin 12.0 - 15.0 g/dL 9.0(L) 10.9(L) 11.4(L)  Hematocrit 36.0 - 46.0 % 29.0(L) 33.6(L) 35.1(L)  Platelets 150 - 400 K/uL 450(H) 439(H) 481(H)   CMP Latest Ref Rng & Units 08/18/2017 08/14/2017 08/14/2017  Glucose 65 - 99 mg/dL 192(H) 213(H) 206(H)  BUN 6 - 20 mg/dL 15 <5(L) 5(L)  Creatinine 0.44 - 1.00 mg/dL 0.62 0.63 0.79  Sodium 135 - 145 mmol/L 140 137 135  Potassium 3.5 - 5.1 mmol/L 3.0(L) 3.8 2.8(L)  Chloride 101 - 111 mmol/L 95(L) 93(L) 90(L)  CO2 22 - 32 mmol/L 34(H) 30 29  Calcium 8.9 - 10.3 mg/dL 8.6(L) 8.4(L) 8.0(L)  Total Protein 6.5 - 8.1 g/dL - - -  Total Bilirubin 0.3 - 1.2 mg/dL - - -  Alkaline Phos 38 - 126 U/L - - -  AST 15 - 41 U/L - - -  ALT 14 - 54 U/L - - -   Lactic acid 1.46 UTI positive for amber cloudy urine, moderate Hgb, 80 ketones, 100 protein, positive nitrites, moderate leukocytes, too numerous to count WBC, many bacteria, 0-5 squam epithelial cells, +WBC clumps and +mucus UCx >100K colonies of Klebsiella pneumoniae and E. Coli BCx 2/13 NGTD x4d Mg 1.4 Flu negative  CXR 08/13/2017 Low lung volumes.  No acute findings.  Discharge Instructions: Discharge Instructions    Call MD for:  persistant  dizziness or light-headedness   Complete by:  As directed    Call MD for:  persistant nausea and vomiting   Complete by:  As directed    Call MD for:  redness, tenderness, or signs of infection (pain, swelling, redness, odor or green/yellow discharge around incision site)   Complete by:  As directed    Call MD for:  severe uncontrolled pain   Complete by:  As directed    Call MD for:  temperature >100.4   Complete by:  As directed    Diet - low sodium heart healthy   Complete by:  As directed    Discharge instructions   Complete by:  As directed    Ms. Escareno,  It was a pleasure to take care of you in the hospital. While you were here, we treated you for a urine infection with antibiotics. Please finish your course of antibiotics by taking 500mg  augmentin twice a day for three more doses. You already received a dose this morning in the hospital, so you will take one pill later tonight and then two times tomorrow.  Please follow up with Dr. Bradd Burner as scheduled.   Increase activity slowly   Complete by:  As directed      Signed: Colbert Ewing, MD 08/18/2017, 9:53 AM   Pager: Mamie Nick [phone]

## 2017-08-15 NOTE — Progress Notes (Signed)
CM spoke with SW from [facility] yesterday. Pt goes to the facility 5 days a week and has aides 7 days a week in the am's and pm's. Per [facility] they have been trying to get her to agree to going into either a facility or moving closer to family and pt has been resistant. Pt's sons to be available today and hopefully can convince her to either move closer to them and have [facility] there or into a facility. CM following.

## 2017-08-15 NOTE — Progress Notes (Signed)
Medicine attending: I examined this patient today together with resident physician Dr. Colbert Ewing and I concur with her evaluation and management plan. Depressed affect.  Persistent left groin pain. Day 2 ceftriaxone for urinary tract infection.  Still with intermittent fever to 101.1.  Persistent tachycardia even when not febrile.  We will check thyroid functions.  EKG to make sure this is sinus rhythm. Tender on palpation left inguinal region.  No skin breakdown.  No lymphadenopathy.  Chronic Foley catheter. Impression: Urinary tract infection in woman with a neurogenic bladder, chronic Foley catheter, secondary to multiple sclerosis complicated by insulin-dependent diabetes. We will continue current management plan.

## 2017-08-15 NOTE — Progress Notes (Signed)
Pt. Has been tachycardia ,since 2149. Three consecutive Vital signs show the heart rate in the 130s. The Physician  On call was notified

## 2017-08-15 NOTE — Progress Notes (Signed)
Subjective:  Laura Roberts was seen lying in bed this morning. She denies shortness of breath, palpitations, or chest pain. However, she does endorse left groin pain and subjective fevers.  Objective:  Vital signs in last 24 hours: Vitals:   08/14/17 2149 08/15/17 0140 08/15/17 0455 08/15/17 0511  BP: 130/67 (!) 94/49 (!) 97/58 (!) 108/58  Pulse: (!) 137 (!) 139 (!) 139 (!) 130  Resp: 20 20 20    Temp: (!) 101.1 F (38.4 C) 99.2 F (37.3 C) 98.9 F (37.2 C)   TempSrc: Oral Oral Oral   SpO2: 97% 93% 99%   Weight:       GEN: Well-nourished female lying in bed. Alert and oriented. No acute distress. Appears tired. HENT: [place]/AT. Moist mucous membranes. No visible lesions. EYES: PERRL. Sclera non-icteric. Conjunctiva clear. RESP: Clear to auscultation bilaterally. No wheezes, rales, or rhonchi. No increased work of breathing. CV: Tachycardic and regular rhythm. No murmurs, gallops, or rubs. Trace LE edema. ABD: Soft. Non-tender. Non-distended. Normoactive bowel sounds. EXT: Trace LE edema. Skin is very warm. GU: Foley catheter in place. Tenderness to palpation of L groin area. No LAD in that area, and no lesions. NEURO: Cranial nerves II-XII grossly intact. No sensation in BLE and 0 strength in BLE. +spasms in bilateral feet, R>L. Able to lift all four extremities against gravity. No apparent audiovisual hallucinations. Speech fluent and appropriate. PSYCH: Patient is calm and pleasant. Appropriate affect. Well-groomed; speech is appropriate and on-subject.  Assessment/Plan:  Principal Problem:   UTI (urinary tract infection) due to urinary indwelling Foley catheter ([place]) Active Problems:   Multiple sclerosis (HCC)   HTN (hypertension)   DM (diabetes mellitus) ([place])   GERD (gastroesophageal reflux disease)   Neurogenic bladder  Laura Roberts is a 77yo female with PMH of MS (with paraplegia, neurogenic bowel and bladder, with chronic indwelling catheter), T2DM, HTN, depression, and  degenerative arthritis who presented with acute onset of N/V/D, left groin pain, and fever to 101.2. Also noted to have WBC 27 and hypokalemia to 2.3 on admission. UA positive for UTI, currently treating with IV ceftriaxone. Improved in her symptoms this morning. Remaining intermittently febrile - overnight temp was 101.1.  UTI with chronic indwelling catheter Managing with IV ceftriaxone. BCx and UCx pending. Spiked another fever to 101.1 this morning and still tachycardic. Flu negative. - Ondansetron 4mg  PRN for nausea - Continue ceftriaxone 1 gram daily - F/u Urine Cx pending - F/u Blood Cx 2/13 -> NGTD - Continue to monitor fever curve - D/c droplet precautions  Tachycardia Noted to be tachycardic to the 130s. Patient denies palpitations or chest pain. Hx of tachycardia in the 100s on previous admissions. Etiology may be secondary to her infection or autonomic dysfunction from MS. TSH low in 2014. - EKG - TSH  Hypokalemia, resolved Hypomagnesemia K 2.3 on admission, which has improved to 3.8 s/p IV and PO repletion. Mg also 1.4, repleted. Will continue to monitor.  Multiple sclerosis Diagnosed in 2007 with MS, with paraplegia, neurogenic bladder with chronic indwelling catheter, and neurogenic bowel. - Continue home dimethyl fumarate 240mg  BID (patient will take home medication) - Continue home prednisone 5mg  on odd days, 2.5mg  on even days - Fall precautions - Continue home Gabapentin 300mg  BID for neuropathic pain - Continue Calcium citrate supplement - Continue baclofen for muscle spasms  - Continue Vit D supplement   GERD - Continue pantoprazole 40mg  daily  MDD No acute issues noted. Denied thoughts of self harm - Continue Duloxetine 90mg   PO daily  DMII, insulin dependent No acute or active issues. Serum glucose in 200s. Home regimen includes glimepiride 2mg  daily and lantus 25u QHS. - Hold glimepiride - Continue lantus 15u QHS - SSI sensitive TID w/ meals - CBG  monitoring  HTN Currently normotensive. 108/58. Home regimen includes amlodipine 10mg  daily. Will continue holding home medication. - Can restart amlodipine 10mg  daily if becomes hypertensive  Dispo: Anticipated discharge in approximately 2-3 day(s).   Colbert Ewing, MD 08/15/2017, 7:20 AM Pager: Mamie Nick [phone]

## 2017-08-16 DIAGNOSIS — R509 Fever, unspecified: Secondary | ICD-10-CM

## 2017-08-16 DIAGNOSIS — B9620 Unspecified Escherichia coli [E. coli] as the cause of diseases classified elsewhere: Secondary | ICD-10-CM

## 2017-08-16 DIAGNOSIS — E0590 Thyrotoxicosis, unspecified without thyrotoxic crisis or storm: Secondary | ICD-10-CM

## 2017-08-16 DIAGNOSIS — B961 Klebsiella pneumoniae [K. pneumoniae] as the cause of diseases classified elsewhere: Secondary | ICD-10-CM

## 2017-08-16 DIAGNOSIS — M1990 Unspecified osteoarthritis, unspecified site: Secondary | ICD-10-CM

## 2017-08-16 LAB — GLUCOSE, CAPILLARY
GLUCOSE-CAPILLARY: 200 mg/dL — AB (ref 65–99)
Glucose-Capillary: 159 mg/dL — ABNORMAL HIGH (ref 65–99)
Glucose-Capillary: 353 mg/dL — ABNORMAL HIGH (ref 65–99)
Glucose-Capillary: 281 mg/dL — ABNORMAL HIGH (ref 65–99)

## 2017-08-16 LAB — URINE CULTURE

## 2017-08-16 LAB — T3: T3, Total: 48 ng/dL — ABNORMAL LOW (ref 71–180)

## 2017-08-16 MED ORDER — SODIUM CHLORIDE 0.9% FLUSH
3.0000 mL | INTRAVENOUS | Status: DC | PRN
Start: 2017-08-16 — End: 2017-08-18

## 2017-08-16 MED ORDER — AMOXICILLIN-POT CLAVULANATE 500-125 MG PO TABS
1.0000 | ORAL_TABLET | Freq: Two times a day (BID) | ORAL | Status: DC
  Administered 2017-08-17 – 2017-08-18 (×3): 500 mg via ORAL
  Filled 2017-08-16 (×3): qty 1

## 2017-08-16 MED ORDER — GERHARDT'S BUTT CREAM
TOPICAL_CREAM | Freq: Two times a day (BID) | CUTANEOUS | Status: DC | PRN
  Administered 2017-08-16: 1 via TOPICAL
  Filled 2017-08-16: qty 1

## 2017-08-16 MED ORDER — SODIUM CHLORIDE 0.9% FLUSH
3.0000 mL | Freq: Two times a day (BID) | INTRAVENOUS | Status: DC
  Administered 2017-08-16 – 2017-08-18 (×5): 3 mL via INTRAVENOUS

## 2017-08-16 NOTE — Plan of Care (Signed)
  Education: Knowledge of General Education information will improve 08/16/2017 0137 - Progressing by Marcos Eke, RN   Health Behavior/Discharge Planning: Ability to manage health-related needs will improve 08/16/2017 0137 - Progressing by Carin Hock I, RN   Clinical Measurements: Ability to maintain clinical measurements within normal limits will improve 08/16/2017 0137 - Progressing by Marcos Eke, RN   Clinical Measurements: Will remain free from infection 08/16/2017 0137 - Progressing by Marcos Eke, RN   Clinical Measurements: Diagnostic test results will improve 08/16/2017 0137 - Progressing by Carin Hock I, RN   Clinical Measurements: Respiratory complications will improve 08/16/2017 0137 - Progressing by Marcos Eke, RN

## 2017-08-16 NOTE — Progress Notes (Signed)
  Date: 08/16/2017  Patient name: Laura Roberts  Medical record number: [id number]  Date of birth: 12-13-1940   I have seen and evaluated this patient and I have discussed the plan of care with the house staff. Please see Dr. Allayne Gitelman note for complete details. I concur with her findings.    Sid Falcon, MD 08/16/2017, 9:16 AM

## 2017-08-16 NOTE — Progress Notes (Signed)
Subjective:  Ms. Lalli was seen lying in bed this morning. She denies shortness of breath, palpitations, chest pain, or fevers. She also reports her left groin pain is improved. She notes that her mouth is dry.  Objective:  Vital signs in last 24 hours: Vitals:   08/15/17 1752 08/15/17 2207 08/16/17 0125 08/16/17 0541  BP: 126/73 (!) 100/54 (!) 97/57 (!) 118/57  Pulse: (!) 116 (!) 110 (!) 107 (!) 111  Resp: 20 20 20 20   Temp: 98.4 F (36.9 C) 98.4 F (36.9 C) 98.5 F (36.9 C) 97.8 F (36.6 C)  TempSrc: Oral Oral Oral Oral  SpO2: 95% 95% 97% 96%  Weight:       GEN: Well-nourished female lying in bed. Alert and oriented. No acute distress. HENT: [place]/AT. Moist mucous membranes. No visible lesions. EYES: PERRL. Sclera non-icteric. Conjunctiva clear. RESP: Clear to auscultation bilaterally. No wheezes, rales, or rhonchi. No increased work of breathing. CV: Tachycardic and regular rhythm. No murmurs, gallops, or rubs. Trace LE edema. ABD: Soft. Non-tender. Non-distended. Normoactive bowel sounds. EXT: Trace LE edema. Skin is warm. GU: Foley catheter in place. NEURO: Cranial nerves II-XII grossly intact. No sensation in BLE and 0 strength in BLE. +spasms in bilateral feet, R>L. Able to lift all four extremities against gravity. No apparent audiovisual hallucinations. Speech fluent and appropriate. PSYCH: Patient is calm and pleasant. Appropriate affect. Well-groomed; speech is appropriate and on-subject.  Assessment/Plan:  Principal Problem:   UTI (urinary tract infection) due to urinary indwelling Foley catheter ([place]) Active Problems:   Multiple sclerosis (HCC)   HTN (hypertension)   DM (diabetes mellitus) ([place])   GERD (gastroesophageal reflux disease)   Neurogenic bladder  Laura Roberts is a 77yo female with PMH of MS (with paraplegia, neurogenic bowel and bladder, with chronic indwelling catheter), T2DM, HTN, depression, and degenerative arthritis who presented with acute onset  of N/V/D, left groin pain, and fever to 101.2. Also noted to have WBC 27 and hypokalemia to 2.3 on admission. UA positive for UTI, currently treating with IV ceftriaxone. Symptoms improved this morning. Afebrile since 2/14 PM.  UTI with chronic indwelling catheter Managing with IV ceftriaxone. BCx NGTD. UCx with Klebsiella pneumoniae and E. Coli, sensitivities pending. Afebrile since 2/14 PM. Tachycardia improved. - Ondansetron 4mg  PRN for nausea - Continue ceftriaxone 1 gram daily (2/13 -> ) - F/u Urine Cx sensitivities - F/u Blood Cx 2/13 -> NGTD - Continue to monitor fever curve  Hyperthyroidism Noted to have chronic sinus tachycardia. HR improved from 130s to 110s. Patient denies palpitations or chest pain. TSH low, free T4 high. T3 pending. - F/u TRAb - F/u T3  Hypokalemia, resolved Hypomagnesemia Continue to monitor and will replete as needed  Multiple sclerosis Diagnosed in 2007 with MS, with paraplegia, neurogenic bladder with chronic indwelling catheter, and neurogenic bowel. - Continue home dimethyl fumarate 240mg  BID (patient will take home medication) - Continue home prednisone 5mg  on odd days, 2.5mg  on even days - Fall precautions - Continue home Gabapentin 300mg  BID for neuropathic pain - Continue Calcium citrate supplement - Continue baclofen for muscle spasms  - Continue Vit D supplement   GERD - Continue pantoprazole 40mg  daily  MDD No acute issues noted. Denied thoughts of self harm - Continue Duloxetine 90mg  PO daily  DMII, insulin dependent No acute or active issues. Serum glucose in 200s. Home regimen includes glimepiride 2mg  daily and lantus 25u QHS. - Hold glimepiride - Continue lantus 15u QHS - SSI sensitive TID w/ meals -  CBG monitoring  HTN Currently normotensive. 118/57. Home regimen includes amlodipine 10mg  daily. Will continue holding home medication. - Can restart amlodipine 10mg  daily if becomes hypertensive  Dispo: Anticipated discharge  in approximately 2-3 day(s).   Colbert Ewing, MD 08/16/2017, 6:58 AM Pager: Mamie Nick [phone]

## 2017-08-17 LAB — THYROTROPIN RECEPTOR AUTOABS: THYROTROPIN RECEPTOR AB: 0.84 IU/L (ref 0.00–1.75)

## 2017-08-17 LAB — GLUCOSE, CAPILLARY
GLUCOSE-CAPILLARY: 95 mg/dL (ref 65–99)
GLUCOSE-CAPILLARY: 310 mg/dL — AB (ref 65–99)
Glucose-Capillary: 258 mg/dL — ABNORMAL HIGH (ref 65–99)

## 2017-08-17 NOTE — Progress Notes (Signed)
  Date: 08/17/2017  Patient name: Laura Roberts  Medical record number: [id number]  Date of birth: October 01, 1940   This patient's plan of care was discussed with the house staff. Please see Dr. Rivka Safer note for complete details. I concur with her findings.   Sid Falcon, MD 08/17/2017, 6:15 PM

## 2017-08-17 NOTE — Progress Notes (Signed)
   Subjective: Doing well this morning, no complaints.   Objective:  Vital signs in last 24 hours: Vitals:   08/16/17 1819 08/16/17 2140 08/17/17 0108 08/17/17 0532  BP: 102/64 (!) 118/59 122/61 134/65  Pulse: (!) 106 (!) 109 (!) 101 (!) 109  Resp: 18 20 20 18   Temp: 98.3 F (36.8 C) 98.9 F (37.2 C) 98.7 F (37.1 C) 98.8 F (37.1 C)  TempSrc: Oral Oral Oral Oral  SpO2: 94% 93% 95% 100%  Weight:       GEN: Well-nourished female lying in bed. Alert and oriented. No acute distress. RESP: Clear to auscultation anteriorly. No wheezes, rales, or rhonchi. No increased work of breathing. CV: Tachycardic and regular rhythm. No murmurs, gallops, or rubs. Trace LE edema. ABD: Soft. Non-tender. Non-distended. Normoactive bowel sounds. EXT: Trace LE edema. Skin is warm. GU: Foley catheter in place. PSYCH: Patient is calm and pleasant. Appropriate affect. Well-groomed; speech is appropriate and on-subject.   Assessment/Plan:  Laura Roberts is a 77yo female with PMH of MS (with paraplegia, neurogenic bowel and bladder, with chronic indwelling catheter), T2DM, HTN, depression, and degenerative arthritis who presented with acute onset of N/V/D, left groin pain, and fever to 101.2. Also noted to have WBC 27 and hypokalemia to 2.3 on admission. UA positive for UTI, improved clinically with IV ceftriaxone. Afebrile since 2/14 PM. Urine cultures grew klebsiella pneumoniae and e coli.   UTI with chronic indwelling catheter BCx NGTD. UCx with Klebsiella pneumoniae and E. Coli. Afebrile since 2/14 PM. S/p 3 days of IV ceftriaxone, improving clinically. Narrow to Augmentin today per sensitives.  - Ondansetron 4mg  PRN for nausea - Augmentin BID (Day 4 abx) - F/u Blood Cx 2/13 -> NGTD - Continue to monitor fever curve  Hyperthyroidism Noted to have chronic sinus tachycardia. HR improved from 130s to 110s with treatment of UTI which was likely contributing. Patient denies palpitations or chest pain. TSH  low, free T4 high. T3 pending. - F/u TRAb - F/u T3  Hypokalemia, resolved Hypomagnesemia Continue to monitor and will replete as needed  Multiple sclerosis Diagnosed in 2007 with MS, with paraplegia, neurogenic bladder with chronic indwelling catheter, and neurogenic bowel. - Continue home dimethyl fumarate 240mg  BID (patient will take home medication) - Continue home prednisone 5mg  on odd days, 2.5mg  on even days - Fall precautions - Continue home Gabapentin 300mg  BID for neuropathic pain - Continue Calcium citrate supplement - Continue baclofen for muscle spasms  - Continue Vit D supplement   GERD - Continue pantoprazole 40mg  daily  MDD No acute issues noted. Denied thoughts of self harm - Continue Duloxetine 90mg  PO daily  DMII, insulin dependent No acute or active issues. Serum glucose in 200s. Home regimen includes glimepiride 2mg  daily and lantus 25u QHS. - Hold glimepiride - Continue lantus 15u QHS - SSI sensitive TID w/ meals - CBG monitoring  HTN Currently normotensive. Home regimen includes amlodipine 10mg  daily. Holding for now.  FEN: No fluids, replete lytes prn, HH / carb mod VTE ppx: Lovenox  Code Status: DNR    Dispo: Anticipated discharge tomorrow.    Velna Ochs, MD 08/17/2017, 7:55 AM Pager: [phone]

## 2017-08-17 NOTE — Progress Notes (Signed)
Paged MD. Pt heart rate in 120's and O2 is sitting at 91%. Pt asymptomatic. Will continue to monitor. MD to notify RN if we need to make any changes in care.

## 2017-08-17 NOTE — Progress Notes (Signed)
cbg 274 at 1200. Not showing up in computer. Will administer insulin.

## 2017-08-18 DIAGNOSIS — Y838 Other surgical procedures as the cause of abnormal reaction of the patient, or of later complication, without mention of misadventure at the time of the procedure: Secondary | ICD-10-CM

## 2017-08-18 DIAGNOSIS — T83511D Infection and inflammatory reaction due to indwelling urethral catheter, subsequent encounter: Secondary | ICD-10-CM

## 2017-08-18 LAB — BASIC METABOLIC PANEL
Anion gap: 11 (ref 5–15)
BUN: 15 mg/dL (ref 6–20)
CHLORIDE: 95 mmol/L — AB (ref 101–111)
CO2: 34 mmol/L — ABNORMAL HIGH (ref 22–32)
CREATININE: 0.62 mg/dL (ref 0.44–1.00)
Calcium: 8.6 mg/dL — ABNORMAL LOW (ref 8.9–10.3)
GFR calc non Af Amer: >60 mL/min (ref >60)
Glucose, Bld: 192 mg/dL — ABNORMAL HIGH (ref 65–99)
Potassium: 3 mmol/L — ABNORMAL LOW (ref 3.5–5.1)
Sodium: 140 mmol/L (ref 135–145)

## 2017-08-18 LAB — GLUCOSE, CAPILLARY
GLUCOSE-CAPILLARY: 222 mg/dL — AB (ref 65–99)
Glucose-Capillary: 148 mg/dL — ABNORMAL HIGH (ref 65–99)

## 2017-08-18 LAB — CBC
HCT: 29 % — ABNORMAL LOW (ref 36.0–46.0)
Hemoglobin: 9 g/dL — ABNORMAL LOW (ref 12.0–15.0)
MCH: 25.6 pg — ABNORMAL LOW (ref 26.0–34.0)
MCHC: 31 g/dL (ref 30.0–36.0)
MCV: 82.4 fL (ref 78.0–100.0)
PLATELETS: 450 10*3/uL — AB (ref 150–400)
RBC: 3.52 MIL/uL — AB (ref 3.87–5.11)
RDW: 18.5 % — ABNORMAL HIGH (ref 11.5–15.5)
WBC: 13.3 10*3/uL — ABNORMAL HIGH (ref 4.0–10.5)

## 2017-08-18 LAB — CULTURE, BLOOD (ROUTINE X 2)
CULTURE: NO GROWTH
Culture: NO GROWTH
SPECIAL REQUESTS: ADEQUATE
Special Requests: ADEQUATE

## 2017-08-18 MED ORDER — AMOXICILLIN-POT CLAVULANATE 500-125 MG PO TABS: 1.0000 | ORAL_TABLET | Freq: Two times a day (BID) | ORAL | 0 refills | Status: AC

## 2017-08-18 MED ORDER — POTASSIUM CHLORIDE 20 MEQ/15ML (10%) PO SOLN
40.0000 meq | Freq: Once | ORAL | Status: AC
  Administered 2017-08-18: 40 meq via ORAL
  Filled 2017-08-18: qty 30

## 2017-08-18 MED ORDER — GABAPENTIN 300 MG PO CAPS: 300.0000 mg | ORAL_CAPSULE | Freq: Two times a day (BID) | ORAL | 0 refills | Status: DC

## 2017-08-18 NOTE — Progress Notes (Signed)
  Date: 08/18/2017  Patient name: Laura Roberts  Medical record number: [id number]  Date of birth: 06/17/1941   I have seen and evaluated this patient and I have discussed the plan of care with the house staff. Please see their note for complete details. I concur with their findings with the following additions/corrections: stable for D/C home and resume [facility] services. Complete Augmentin for complicated UTI. Repeat thyroid studies as outpt.  Bartholomew Crews, MD 08/18/2017, 11:37 AM

## 2017-08-18 NOTE — Progress Notes (Signed)
Discharge instructions given. Pt verbalized understanding and all questions were answered.  

## 2017-08-18 NOTE — Care Management Note (Addendum)
Case Management Note  Patient Details  Name: Laura Roberts MRN: [id number] Date of Birth: [birth year]  Subjective/Objective:                    Action/Plan: Pt discharging home with [facility]. Pt was active with [facility] prior to hospitalization and plans on returning with [facility].  Holly with [facility] notified of discharge and [facility] plans on providing transportation home for the patient.   AddendumEarnest Bailey returned call. They will pick up the patient at 2 pm. Pt was active with [facility] for [facility] RN prior to admission. [facility] states they will reorder the necessary [place]. Will update Butch Penny with [facility].  Expected Discharge Date:  08/18/17               Expected Discharge Plan:  Home/Self Care([facility])  In-House Referral:     Discharge planning Services  CM Consult  Post Acute Care Choice:    Choice offered to:     DME Arranged:    DME Agency:     HH Arranged:    HH Agency:     Status of Service:  Completed, signed off  If discussed at [employer] of Stay Meetings, dates discussed:    Additional Comments:  Pollie Friar, RN 08/18/2017, 10:18 AM

## 2017-08-18 NOTE — Progress Notes (Signed)
Subjective:  Doing well this morning. Denies fevers, shortness of breath, or left groin pain. She states she tried to call [facility] today but was unable to reach them because they were all in a meeting. Amenable to discharge today.  Objective:  Vital signs in last 24 hours: Vitals:   08/17/17 1736 08/17/17 2152 08/18/17 0133 08/18/17 0513  BP: (!) 103/55 (!) 122/58 (!) 129/55 135/69  Pulse: (!) 113 (!) 103 (!) 108 (!) 111  Resp: 18 20 20 18   Temp: 99.2 F (37.3 C) 99 F (37.2 C) 99.1 F (37.3 C) 98.9 F (37.2 C)  TempSrc: Oral Oral Oral Oral  SpO2: 97% 96% 96% 94%  Weight:       GEN: Well-nourished female lying in bed. Alert and oriented. No acute distress. RESP: Clear to auscultation anteriorly. No wheezes, rales, or rhonchi. No increased work of breathing. CV: Tachycardic and regular rhythm. No murmurs, gallops, or rubs. Trace LE edema. ABD: Soft. Non-tender. Non-distended. Normoactive bowel sounds. EXT: Trace LE edema. GU: Foley catheter in place. PSYCH: Patient is calm and pleasant. Appropriate affect. Well-groomed; speech is appropriate and on-subject.  Assessment/Plan:  Ms. Thibeaux is a 77yo female with PMH of MS (with paraplegia, neurogenic bowel and bladder, with chronic indwelling catheter), T2DM, HTN, depression, and degenerative arthritis who presented with acute onset of N/V/D, left groin pain, and fever to 101.2. Also noted to have WBC 27 and hypokalemia to 2.3 on admission. UA positive for UTI, improved clinically with IV ceftriaxone. Afebrile since 2/14 PM. Urine cultures grew klebsiella pneumoniae and E coli. Narrowed to Augmentin yesterday per culture sensitivities.  Complicated UTI, with chronic indwelling catheter Clinically improved. BCx NGTD. UCx with Klebsiella pneumoniae and E. Coli. Afebrile since 2/14 PM. Narrowed from IV Ceftriaxone to Augmentin yesterday. - Ondansetron 4mg  PRN for nausea - Augmentin BID x7d total (start date 2/13 -> 2/19) - F/u Blood Cx  2/13 -> NGTD - Continue to monitor fever curve - Will touch base with pt's PCP, Dr. Bradd Burner to update on plan  Low TSH, high T4 Noted to have chronic sinus tachycardia. HR improved from 130s to 110s with treatment of UTI, which was likely contributing. Patient denies palpitations or chest pain. TSH low, free T4 high. T3 low and TRAb negative. In acute illness, TFTs may be abnormal, thus recommend f/u labs in 4 weeks. - Outpatient f/u with TSH in 4 weeks  Hypokalemia, resolved Hypomagnesemia K 3.0 this AM - Repleting with PO K 48mEq x1  Multiple sclerosis Diagnosed in 2007 with MS, with paraplegia, neurogenic bladder with chronic indwelling catheter, and neurogenic bowel. - Continue home dimethyl fumarate 240mg  BID (patient will take home medication) - Continue home prednisone 5mg  on odd days, 2.5mg  on even days - Fall precautions - Continue home Gabapentin 300mg  BID for neuropathic pain - Continue Calcium citrate supplement - Continue baclofen for muscle spasms  - Continue Vit D supplement   GERD - Continue pantoprazole 40mg  daily  MDD No acute issues noted. Denied thoughts of self harm - Continue Duloxetine 90mg  PO daily  DMII, insulin dependent No acute or active issues. Serum glucose in 200s. Home regimen includes glimepiride 2mg  daily and lantus 25u QHS. - Hold glimepiride - Continue lantus 15u QHS - SSI sensitive TID w/ meals - CBG monitoring  HTN BP 135/69 this AM. Home regimen includes amlodipine 10mg  daily. Holding for now.  FEN: No fluids, replete lytes prn, HH / carb mod VTE ppx: Lovenox  Code Status: DNR  Dispo: Anticipated discharge today  Colbert Ewing, MD 08/18/2017, 9:06 AM Pager: [phone]

## 2017-08-19 LAB — GLUCOSE, CAPILLARY
GLUCOSE-CAPILLARY: 267 mg/dL — AB (ref 65–99)
Glucose-Capillary: 274 mg/dL — ABNORMAL HIGH (ref 65–99)

## 2017-09-04 ENCOUNTER — Other Ambulatory Visit: Payer: Self-pay | Admitting: Urology

## 2017-09-04 DIAGNOSIS — N312 Flaccid neuropathic bladder, not elsewhere classified: Secondary | ICD-10-CM

## 2017-09-07 ENCOUNTER — Other Ambulatory Visit: Payer: Self-pay | Admitting: Radiology

## 2017-09-09 ENCOUNTER — Ambulatory Visit (HOSPITAL_COMMUNITY)
Admission: RE | Admit: 2017-09-09 | Discharge: 2017-09-09 | Disposition: A | Discharge status: Home / Self Care | Payer: Medicare (Managed Care) | Source: Ambulatory Visit | Attending: Urology | Admitting: Urology

## 2017-09-09 ENCOUNTER — Encounter (HOSPITAL_COMMUNITY): Payer: Self-pay

## 2017-09-09 DIAGNOSIS — Z794 Long term (current) use of insulin: Secondary | ICD-10-CM | POA: Diagnosis not present

## 2017-09-09 DIAGNOSIS — Z6837 Body mass index (BMI) 37.0-37.9, adult: Secondary | ICD-10-CM | POA: Diagnosis not present

## 2017-09-09 DIAGNOSIS — E669 Obesity, unspecified: Secondary | ICD-10-CM | POA: Insufficient documentation

## 2017-09-09 DIAGNOSIS — Z9049 Acquired absence of other specified parts of digestive tract: Secondary | ICD-10-CM | POA: Diagnosis not present

## 2017-09-09 DIAGNOSIS — K219 Gastro-esophageal reflux disease without esophagitis: Secondary | ICD-10-CM | POA: Insufficient documentation

## 2017-09-09 DIAGNOSIS — Z7952 Long term (current) use of systemic steroids: Secondary | ICD-10-CM | POA: Diagnosis not present

## 2017-09-09 DIAGNOSIS — Z96653 Presence of artificial knee joint, bilateral: Secondary | ICD-10-CM | POA: Insufficient documentation

## 2017-09-09 DIAGNOSIS — Z888 Allergy status to other drugs, medicaments and biological substances status: Secondary | ICD-10-CM | POA: Insufficient documentation

## 2017-09-09 DIAGNOSIS — N312 Flaccid neuropathic bladder, not elsewhere classified: Secondary | ICD-10-CM | POA: Insufficient documentation

## 2017-09-09 DIAGNOSIS — Z79899 Other long term (current) drug therapy: Secondary | ICD-10-CM | POA: Diagnosis not present

## 2017-09-09 DIAGNOSIS — I10 Essential (primary) hypertension: Secondary | ICD-10-CM | POA: Diagnosis not present

## 2017-09-09 DIAGNOSIS — G8220 Paraplegia, unspecified: Secondary | ICD-10-CM | POA: Diagnosis not present

## 2017-09-09 DIAGNOSIS — Z885 Allergy status to narcotic agent status: Secondary | ICD-10-CM | POA: Insufficient documentation

## 2017-09-09 DIAGNOSIS — E785 Hyperlipidemia, unspecified: Secondary | ICD-10-CM | POA: Insufficient documentation

## 2017-09-09 DIAGNOSIS — G5603 Carpal tunnel syndrome, bilateral upper limbs: Secondary | ICD-10-CM | POA: Insufficient documentation

## 2017-09-09 DIAGNOSIS — G35 Multiple sclerosis: Secondary | ICD-10-CM | POA: Insufficient documentation

## 2017-09-09 DIAGNOSIS — E119 Type 2 diabetes mellitus without complications: Secondary | ICD-10-CM | POA: Diagnosis not present

## 2017-09-09 LAB — COMPREHENSIVE METABOLIC PANEL
ALBUMIN: 2.8 g/dL — AB (ref 3.5–5.0)
ALK PHOS: 74 U/L (ref 38–126)
ALT: 16 U/L (ref 14–54)
AST: 22 U/L (ref 15–41)
Anion gap: 15 (ref 5–15)
BILIRUBIN TOTAL: 0.8 mg/dL (ref 0.3–1.2)
BUN: 8 mg/dL (ref 6–20)
CO2: 28 mmol/L (ref 22–32)
CREATININE: 0.55 mg/dL (ref 0.44–1.00)
Calcium: 9.5 mg/dL (ref 8.9–10.3)
Chloride: 97 mmol/L — ABNORMAL LOW (ref 101–111)
GFR calc Af Amer: >60 mL/min (ref >60)
GLUCOSE: 66 mg/dL (ref 65–99)
Potassium: 4.3 mmol/L (ref 3.5–5.1)
Sodium: 140 mmol/L (ref 135–145)
TOTAL PROTEIN: 7.4 g/dL (ref 6.5–8.1)

## 2017-09-09 LAB — GLUCOSE, CAPILLARY
Glucose-Capillary: 54 mg/dL — ABNORMAL LOW (ref 65–99)
Glucose-Capillary: 109 mg/dL — ABNORMAL HIGH (ref 65–99)
Glucose-Capillary: 125 mg/dL — ABNORMAL HIGH (ref 65–99)

## 2017-09-09 LAB — CBC WITH DIFFERENTIAL/PLATELET
Basophils Absolute: 0 10*3/uL (ref 0.0–0.1)
Basophils Relative: 0 %
EOS ABS: 0.4 10*3/uL (ref 0.0–0.7)
EOS PCT: 4 %
HCT: 35.1 % — ABNORMAL LOW (ref 36.0–46.0)
Hemoglobin: 11.2 g/dL — ABNORMAL LOW (ref 12.0–15.0)
LYMPHS ABS: 1.5 10*3/uL (ref 0.7–4.0)
Lymphocytes Relative: 14 %
MCH: 27.4 pg (ref 26.0–34.0)
MCHC: 31.9 g/dL (ref 30.0–36.0)
MCV: 85.8 fL (ref 78.0–100.0)
MONOS PCT: 8 %
Monocytes Absolute: 0.8 10*3/uL (ref 0.1–1.0)
Neutro Abs: 8 10*3/uL — ABNORMAL HIGH (ref 1.7–7.7)
Neutrophils Relative %: 74 %
PLATELETS: 549 10*3/uL — AB (ref 150–400)
RBC: 4.09 MIL/uL (ref 3.87–5.11)
RDW: 19.5 % — ABNORMAL HIGH (ref 11.5–15.5)
WBC: 10.8 10*3/uL — ABNORMAL HIGH (ref 4.0–10.5)

## 2017-09-09 LAB — PROTIME-INR
INR: 1.05
PROTHROMBIN TIME: 13.6 s (ref 11.4–15.2)

## 2017-09-09 MED ORDER — SODIUM CHLORIDE 0.9% FLUSH: 5.0000 mL | Freq: Three times a day (TID) | INTRAVENOUS | Status: DC

## 2017-09-09 MED ORDER — SODIUM CHLORIDE 0.9 % IV SOLN
2.0000 g | Freq: Once | INTRAVENOUS | Status: AC
  Administered 2017-09-09: 2 g via INTRAVENOUS
  Filled 2017-09-09: qty 2

## 2017-09-09 MED ORDER — MIDAZOLAM HCL 2 MG/2ML IJ SOLN
INTRAMUSCULAR | Status: AC
  Filled 2017-09-09: qty 4

## 2017-09-09 MED ORDER — SODIUM CHLORIDE 0.9 % IV SOLN: INTRAVENOUS | Status: DC

## 2017-09-09 MED ORDER — SODIUM CHLORIDE 0.9 % IV SOLN
INTRAVENOUS | Status: AC
  Filled 2017-09-09: qty 1000

## 2017-09-09 MED ORDER — DEXTROSE 50 % IV SOLN
INTRAVENOUS | Status: AC
  Filled 2017-09-09: qty 50

## 2017-09-09 MED ORDER — MIDAZOLAM HCL 2 MG/2ML IJ SOLN
INTRAMUSCULAR | Status: AC | PRN
  Administered 2017-09-09: 1 mg via INTRAVENOUS

## 2017-09-09 MED ORDER — DEXTROSE-NACL 5-0.45 % IV SOLN
INTRAVENOUS | Status: DC
  Administered 2017-09-09: 13:00:00 via INTRAVENOUS

## 2017-09-09 MED ORDER — DEXTROSE 50 % IV SOLN
25.0000 mL | INTRAVENOUS | Status: AC
  Administered 2017-09-09: 25 mL via INTRAVENOUS

## 2017-09-09 MED ORDER — SODIUM CHLORIDE FLUSH 0.9 % IV SOLN
INTRAVENOUS | Status: AC
  Filled 2017-09-09: qty 10

## 2017-09-09 MED ORDER — FENTANYL CITRATE (PF) 100 MCG/2ML IJ SOLN
INTRAMUSCULAR | Status: AC | PRN
  Administered 2017-09-09: 50 ug via INTRAVENOUS

## 2017-09-09 MED ORDER — FENTANYL CITRATE (PF) 100 MCG/2ML IJ SOLN
INTRAMUSCULAR | Status: AC
  Filled 2017-09-09: qty 2

## 2017-09-09 NOTE — Discharge Instructions (Signed)
Moderate Conscious Sedation, Adult, Care After These instructions provide you with information about caring for yourself after your procedure. Your health care provider may also give you more specific instructions. Your treatment has been planned according to current medical practices, but problems sometimes occur. Call your health care provider if you have any problems or questions after your procedure. What can I expect after the procedure? After your procedure, it is common:  To feel sleepy for several hours.  To feel clumsy and have poor balance for several hours.  To have poor judgment for several hours.  To vomit if you eat too soon.  Follow these instructions at home: For at least 24 hours after the procedure:   Do not: ? Participate in activities where you could fall or become injured. ? Drive. ? Use heavy machinery. ? Drink alcohol. ? Take sleeping pills or medicines that cause drowsiness. ? Make important decisions or sign legal documents. ? Take care of children on your own.  Rest. Eating and drinking  Follow the diet recommended by your health care provider.  If you vomit: ? Drink water, juice, or soup when you can drink without vomiting. ? Make sure you have little or no nausea before eating solid foods. General instructions  Have a responsible adult stay with you until you are awake and alert.  Take over-the-counter and prescription medicines only as told by your health care provider.  If you smoke, do not smoke without supervision.  Keep all follow-up visits as told by your health care provider. This is important. Contact a health care provider if:  You keep feeling nauseous or you keep vomiting.  You feel light-headed.  You develop a rash.  You have a fever. Get help right away if:  You have trouble breathing. This information is not intended to replace advice given to you by your health care provider. Make sure you discuss any questions you have  with your health care provider. Document Released: 04/07/2013 Document Revised: 11/20/2015 Document Reviewed: 10/07/2015 Elsevier Interactive Patient Education  2018 [place] dressing around tube as needed. Empty when full

## 2017-09-09 NOTE — Progress Notes (Signed)
CRITICAL VALUE ALERT  Critical Value:  54 CBG  Date & Time Notied: 4175   09/09/17  Provider Notified:Kevin Allred PA  Orders Received/Actions taken: Patient arrived here for procedure in interventional radiology. Took insulin last night and NPO this am. CBG 54 upon arrival. IV started and 1/2 amp D50 given per order. CBG up to 109. Patient states she feels fine. Sisters with patient at bedside. IR PA and MD have been by to see patient pre procedure. Will continue to monitor.

## 2017-09-09 NOTE — Consult Note (Signed)
Chief Complaint: Patient was seen in consultation today for suprapubic catheter placement  Referring Physician(s): Bell,[name]  Supervising Physician: Sandi Mariscal  Patient Status: [facility] - Out-pt  History of Present Illness: Laura Roberts is a 77 y.o. female with history of diabetes, MS, paraplegia with areflexic/neurogenic bowel/bladder and chronic indwelling Foley catheter.  She has had recent UTI with skin breakdown associated with leaking of urine from Foley catheter.  She presents today for suprapubic catheter placement.  Past Medical History:  Diagnosis Date  . Degenerative arthritis   . Diabetes mellitus without complication ([place])   . Gait disturbance   . Hypertension   . Multiple sclerosis ([place])   . Neuromuscular disorder (HCC)    Bilateral hand carpal tunnel syndrome  . Obesity   . Spinal stenosis in cervical region   . Spinal stenosis of lumbosacral region   . Spinal stenosis, thoracic     Past Surgical History:  Procedure Laterality Date  . ABDOMINAL HYSTERECTOMY    . BACK SURGERY    . CATARACT EXTRACTION Right   . CHOLECYSTECTOMY    . FEMUR IM NAIL Right 06/02/2014   Procedure: INTRAMEDULLARY (IM) RETROGRADE FEMORAL NAILING;  Surgeon: Johnny Bridge, MD;  Location: [facility];  Service: Orthopedics;  Laterality: Right;  . left hand carpal tunnel surgery    . REPLACEMENT TOTAL KNEE BILATERAL  2004    Allergies: Codeine; Ultram [tramadol]; and Januvia [sitagliptin]  Medications: Prior to Admission medications   Medication Sig Start Date End Date Taking? Authorizing Provider  amLODipine (NORVASC) 10 MG tablet Take 10 mg by mouth at bedtime.    Yes [provider]  baclofen (LIORESAL) 10 MG tablet 1.5 tablet in the morning, 1 tablet midday, two tablets at night Patient taking differently: Take 10 mg by mouth See admin instructions. 15 mg  tablet in the morning, 10 mg  tablet midday, 20 mg tablets at night 11/29/15  Yes Millikan, Megan, NP    calcium citrate (CALCITRATE - DOSED IN MG ELEMENTAL CALCIUM) 950 MG tablet Take 200 mg of elemental calcium by mouth daily.   Yes [provider]  cholecalciferol (VITAMIN D) 1000 UNITS tablet Take 1,000 Units by mouth daily.   Yes [provider]  Dentifrices (BIOTENE DRY MOUTH) GEL Take 1 application by mouth 2 (two) times daily.   Yes [provider]  Dimethyl Fumarate (TECFIDERA) 240 MG CPDR Take 1 capsule (240 mg total) by mouth 2 (two) times daily. 03/13/16  Yes Kathrynn Ducking, MD  DULoxetine (CYMBALTA) 60 MG capsule Take 90 mg by mouth daily. Take along with a 30mg    Yes [provider]  gabapentin (NEURONTIN) 300 MG capsule Take 1 capsule (300 mg total) by mouth 2 (two) times daily. 08/18/17  Yes Colbert Ewing, MD  glimepiride (AMARYL) 2 MG tablet Take 2 mg by mouth daily.    Yes [provider]  insulin glargine (LANTUS) 100 UNIT/ML injection Inject 25 Units into the skin at bedtime.    Yes [provider]  Multiple Vitamins-Minerals (MULTIVITAMIN ADULT PO) Take by mouth. One daily   Yes [provider]  omeprazole (PRILOSEC) 20 MG capsule Take 20 mg by mouth daily.   Yes [provider]  predniSONE (DELTASONE) 5 MG tablet 1 tablet on odd days, one half tablet on even days Patient taking differently: Take 5 mg by mouth See admin instructions. 5 mg  tablet on odd days, 2.5 mg tablet on even days 11/22/14  Yes Willis,  Elon Alas, MD  senna-docusate (SENOKOT-S) 8.6-50 MG tablet Take 2 tablets by mouth at bedtime.   Yes [provider]  acetaminophen (TYLENOL) 325 MG tablet Take 650 mg by mouth 2 (two) times daily.     [provider]  Amino Acids-Protein Hydrolys (FEEDING SUPPLEMENT, PRO-STAT SUGAR FREE 64,) LIQD Take 30 mLs by mouth 2 (two) times daily. Patient not taking: Reported on 08/13/2017 11/26/16   Minus Liberty, MD  ARTIFICIAL TEAR OP Apply 1 drop to eye 4 (four) times daily as needed (dry  eyes).     [provider]  feeding supplement, GLUCERNA SHAKE, (GLUCERNA SHAKE) LIQD Take 1 Can by mouth 2 (two) times daily.    [provider]  lidocaine (LIDODERM) 5 % Place 1 patch onto the skin daily as needed (pain). Remove & Discard patch within 12 hours or as directed by MD     [provider]  Skin Protectants, Misc. (EUCERIN) cream Apply 1 application topically 2 (two) times daily.    [provider]     Family History  Problem Relation Age of Onset  . Multiple sclerosis Other        neices.   . Cancer Mother   . Diabetes Mother   . GI Bleed Sister        diverticulitis    Social History   Socioeconomic History  . Marital status: Widowed    Spouse name: None  . Number of children: 2  . Years of education: 54yr colleg  . Highest education level: None  Social Needs  . Financial resource strain: None  . Food insecurity - worry: None  . Food insecurity - inability: None  . Transportation needs - medical: None  . Transportation needs - non-medical: None  Occupational History  . Occupation: N/A    Employer: [employer]    Comment: NIGHT RESIDENT [employer]  Tobacco Use  . Smoking status: Never Smoker  . Smokeless tobacco: Never Used  Substance and Sexual Activity  . Alcohol use: No  . Drug use: No  . Sexual activity: No  Other Topics Concern  . None  Social History Narrative   Patient is widowed with 2 children   Patient is right handed   Patient has 2 yrs of college   Patient drinks 2-3 cups of tea daily     Review of Systems denies fever, headache, chest pain, dyspnea, cough, abdominal/back pain, nausea, vomiting  Vital Signs: Ht 5\' 2"  (1.575 m)   Wt 203 lb (92.1 kg)   BMI 37.13 kg/m   Physical Exam awake, alert.  Chest clear to auscultation bilaterally anteriorly.  Heart with regular rate and rhythm.  Abdomen soft, positive bowel sounds, nontender; Foley catheter in place draining yellow urine; trace lower extremity  edema.  Imaging: Dg Chest 2 View  Result Date: 08/13/2017 CLINICAL DATA:  Fever tachycardia EXAM: CHEST  2 VIEW COMPARISON:  11/22/2016 FINDINGS: Normal cardiac silhouette. Lung volumes are low. No effusion, infiltrate, or pneumothorax. Degenerate changes in the shoulders. IMPRESSION: Low lung volumes.  No acute findings. Electronically Signed   By: Suzy Bouchard M.D.   On: 08/13/2017 20:19    Labs:  CBC: Recent Labs    08/13/17 1831 08/14/17 0023 08/18/17 0221 09/09/17 1250  WBC 27.7* 24.2* 13.3* 10.8*  HGB 11.4* 10.9* 9.0* 11.2*  HCT 35.1* 33.6* 29.0* 35.1*  PLT 481* 439* 450* 549*    COAGS: Recent Labs    11/22/16 1536  INR 1.08  BMP: Recent Labs    08/13/17 1831 08/14/17 0023 08/14/17 0654 08/18/17 0221  NA 133* 135 137 140  K 2.3* 2.8* 3.8 3.0*  CL 84* 90* 93* 95*  CO2 29 29 30  34*  GLUCOSE 158* 206* 213* 192*  BUN 5* 5* <5* 15  CALCIUM 8.7* 8.0* 8.4* 8.6*  CREATININE 0.80 0.79 0.63 0.62  GFRNONAA >60 >60 >60 >60  GFRAA >60 >60 >60 >60    LIVER FUNCTION TESTS: Recent Labs    11/22/16 1536 03/06/17 1427 08/13/17 1831  BILITOT 1.8* 0.3 2.1*  AST 26 11 17   ALT 24 9 13*  ALKPHOS 98 79 87  PROT 5.9* 7.2 7.2  ALBUMIN 2.1* 4.3 2.2*    TUMOR MARKERS: No results for input(s): AFPTM, CEA, CA199, CHROMGRNA in the last 8760 hours.  Assessment and Plan: 77 y.o. female with history of diabetes, MS, paraplegia with areflexic/neurogenic bowel/bladder and chronic indwelling Foley catheter.  She has had recent UTI with skin breakdown associated with leaking of urine from Foley catheter.  She presents today for suprapubic catheter placement per urology request.Risks and benefits discussed with the patient including bleeding, infection, damage to adjacent structures, and sepsis.  All of the patient's questions were answered, patient is agreeable to proceed. Consent signed and in chart.     Thank you for this interesting consult.  I greatly enjoyed  meeting Laura Roberts and look forward to participating in their care.  A copy of this report was sent to the requesting provider on this date.  Electronically Signed: D. Rowe Robert, PA-C 09/09/2017, 1:42 PM   I spent a total of 25 minutes    in face to face in clinical consultation, greater than 50% of which was counseling/coordinating care for suprapubic catheter placement

## 2017-09-09 NOTE — Procedures (Signed)
Pre procedural Dx: Urinary incontinence Post procedural Dx: Same  Technically successful CT guided placed of a 12 Fr drainage catheter into the urinary bladder yielding clear urine.    EBL: None  Complications: None immediate  Ronny Bacon, MD Pager #: [phone]

## 2017-09-11 ENCOUNTER — Other Ambulatory Visit (HOSPITAL_COMMUNITY): Payer: Self-pay | Admitting: Interventional Radiology

## 2017-09-11 ENCOUNTER — Ambulatory Visit (INDEPENDENT_AMBULATORY_CARE_PROVIDER_SITE_OTHER): Payer: Medicare (Managed Care) | Admitting: Neurology

## 2017-09-11 ENCOUNTER — Encounter: Payer: Self-pay | Admitting: Neurology

## 2017-09-11 VITALS — BP 133/79 | HR 105 | Ht 62.0 in

## 2017-09-11 DIAGNOSIS — G35 Multiple sclerosis: Secondary | ICD-10-CM | POA: Diagnosis not present

## 2017-09-11 DIAGNOSIS — N312 Flaccid neuropathic bladder, not elsewhere classified: Secondary | ICD-10-CM

## 2017-09-11 NOTE — Progress Notes (Signed)
Reason for visit: Multiple sclerosis  Laura Roberts is an 77 y.o. female  History of present illness:  Laura Roberts is a 77 year old right-handed black female with a history of multiple sclerosis with a paraparesis.  The patient has a neurogenic bladder, she had indwelling catheterization, and she just recently within the last 2 days was converted to a suprapubic catheter.  Since the surgery, her spasticity in the legs has heightened some.  The patient has not noted any new deficits from the MS, she denies any new numbness or weakness of the arms, or any vision changes or swallowing problems.  The patient did have MRI of the brain and cervical spinal cord done within the last 6 months, this appeared to be stable from 2015.  The patient has an orthopedic issue with the left shoulder, she has seen Dr. Noemi Chapel for this and received injections, but she still has limited ability to elevate the left arm.  The patient returns to the office today for an evaluation.  She remains on Tecfidera, she tolerates the drug well.  She does have occasional hot flashes.  She has had some recent diarrhea issues but this has started since she has been on antibiotics after she was admitted to the hospital on 13 August 2017 with a urinary tract infection.  Past Medical History:  Diagnosis Date  . Degenerative arthritis   . Diabetes mellitus without complication ([place])   . Gait disturbance   . Hypertension   . Multiple sclerosis ([place])   . Neuromuscular disorder (HCC)    Bilateral hand carpal tunnel syndrome  . Obesity   . Spinal stenosis in cervical region   . Spinal stenosis of lumbosacral region   . Spinal stenosis, thoracic     Past Surgical History:  Procedure Laterality Date  . ABDOMINAL HYSTERECTOMY    . BACK SURGERY    . CATARACT EXTRACTION Right   . CHOLECYSTECTOMY    . FEMUR IM NAIL Right 06/02/2014   Procedure: INTRAMEDULLARY (IM) RETROGRADE FEMORAL NAILING;  Surgeon: Johnny Bridge, MD;   Location: [facility];  Service: Orthopedics;  Laterality: Right;  . left hand carpal tunnel surgery    . REPLACEMENT TOTAL KNEE BILATERAL  2004    Family History  Problem Relation Age of Onset  . Multiple sclerosis Other        neices.   . Cancer Mother   . Diabetes Mother   . GI Bleed Sister        diverticulitis    Social history:  reports that  has never smoked. she has never used smokeless tobacco. She reports that she does not drink alcohol or use drugs.    Allergies  Allergen Reactions  . Codeine Other (See Comments)    Difficult breathing and skin problem  . Ultram [Tramadol] Other (See Comments)    Difficult breathing and skin peeling  . Januvia [Sitagliptin] Rash    Blisters    Medications:  Prior to Admission medications   Medication Sig Start Date End Date Taking? Authorizing Provider  acetaminophen (TYLENOL) 325 MG tablet Take 650 mg by mouth 2 (two) times daily.     [provider]  Amino Acids-Protein Hydrolys (FEEDING SUPPLEMENT, PRO-STAT SUGAR FREE 64,) LIQD Take 30 mLs by mouth 2 (two) times daily. Patient not taking: Reported on 08/13/2017 11/26/16   Minus Liberty, MD  amLODipine (NORVASC) 10 MG tablet Take 10 mg by mouth at bedtime.     [provider]  ARTIFICIAL  TEAR OP Apply 1 drop to eye 4 (four) times daily as needed (dry eyes).     [provider]  baclofen (LIORESAL) 10 MG tablet 1.5 tablet in the morning, 1 tablet midday, two tablets at night Patient taking differently: Take 10 mg by mouth See admin instructions. 15 mg  tablet in the morning, 10 mg  tablet midday, 20 mg tablets at night 11/29/15   Ward Givens, NP  calcium citrate (CALCITRATE - DOSED IN MG ELEMENTAL CALCIUM) 950 MG tablet Take 200 mg of elemental calcium by mouth daily.    [provider]  cholecalciferol (VITAMIN D) 1000 UNITS tablet Take 1,000 Units by mouth daily.    [provider]  Dentifrices (BIOTENE DRY MOUTH) GEL Take 1  application by mouth 2 (two) times daily.    [provider]  Dimethyl Fumarate (TECFIDERA) 240 MG CPDR Take 1 capsule (240 mg total) by mouth 2 (two) times daily. 03/13/16   Kathrynn Ducking, MD  DULoxetine (CYMBALTA) 60 MG capsule Take 90 mg by mouth daily. Take along with a 30mg     [provider]  feeding supplement, GLUCERNA SHAKE, (GLUCERNA SHAKE) LIQD Take 1 Can by mouth 2 (two) times daily.    [provider]  gabapentin (NEURONTIN) 300 MG capsule Take 1 capsule (300 mg total) by mouth 2 (two) times daily. 08/18/17   Colbert Ewing, MD  glimepiride (AMARYL) 2 MG tablet Take 2 mg by mouth daily.     [provider]  insulin glargine (LANTUS) 100 UNIT/ML injection Inject 25 Units into the skin at bedtime.     [provider]  lidocaine (LIDODERM) 5 % Place 1 patch onto the skin daily as needed (pain). Remove & Discard patch within 12 hours or as directed by MD     [provider]  Multiple Vitamins-Minerals (MULTIVITAMIN ADULT PO) Take by mouth. One daily    [provider]  omeprazole (PRILOSEC) 20 MG capsule Take 20 mg by mouth daily.    [provider]  predniSONE (DELTASONE) 5 MG tablet 1 tablet on odd days, one half tablet on even days Patient taking differently: Take 5 mg by mouth See admin instructions. 5 mg  tablet on odd days, 2.5 mg tablet on even days 11/22/14   Kathrynn Ducking, MD  senna-docusate (SENOKOT-S) 8.6-50 MG tablet Take 2 tablets by mouth at bedtime.    [provider]  Skin Protectants, Misc. (EUCERIN) cream Apply 1 application topically 2 (two) times daily.    [provider]    ROS:  Out of a complete 14 system review of symptoms, the patient complains only of the following symptoms, and all other reviewed systems are negative.  Fatigue, excessive sweating Eye pain Cough Chest pain Heat intolerance Abdominal pain, constipation, diarrhea, incontinence of the  bowels  Blood pressure 133/79, pulse (!) 105, SpO2 95 %.  Physical Exam  General: The patient is alert and cooperative at the time of the examination.  The patient is moderately to markedly obese.  Skin: 1+ and below the knees is seen bilaterally.   Neurologic Exam  Mental status: The patient is alert and oriented x 3 at the time of the examination. The patient has apparent normal recent and remote memory, with an apparently normal attention span and concentration ability.   Cranial nerves: Facial symmetry is present. Speech is normal, no aphasia or dysarthria is noted. Extraocular movements are full. Visual fields are full.  Pupils are equal, round, and  reactive to light.  Discs are flat bilaterally  Motor: The patient has good strength in the upper extremities.  The patient has no voluntary use of the lower extremities  Sensory examination: Soft touch sensation is symmetric on the face and arms, decreased on the legs.  Coordination: The patient has good finger-nose-finger bilaterally.  The patient is unable form heel to shin on either side.  Gait and station: The patient is paraplegic, she cannot walk.  She is wheelchair-bound  Reflexes: Deep tendon reflexes are symmetric.   MRI brain and cervical 03/21/17:   IMPRESSION: MRI HEAD IMPRESSION:  1. Advanced cerebral white matter disease, consistent with provided history of multiple sclerosis. Overall, findings are relatively stable in appearance as compared to 2015 without evidence for significant disease progression. No evidence for active demyelination. 2. No other acute intracranial abnormality. MRI CERVICAL SPINE IMPRESSION:  1. Stable appearance of two small probable demyelinating lesions within the dorsal cord at C2-3 and C6-7, not significantly changed from previous. No evidence for active demyelination. 2. Extensive multilevel cervical spondylolysis, mildly progressed relative to 2014. Similar spinal stenosis at  C4-5. 3. Chronic multinodular goiter.  * MRI scan images were reviewed online. I agree with the written report.    Assessment/Plan:  1.  Multiple sclerosis  2.  Paraplegia  3.  Neurogenic bladder and bowels  The patient is stable on Tecfidera, she will continue the medication for now.  She has had recent blood work done in the hospital.  The patient will follow-up in 6 months, sooner if needed.  She seemed to be fairly stable with her MS at this point.  Jill Alexanders MD 09/11/2017 1:24 PM  [facility] [address] [place], [place] [address]  Phone [phone] Fax [phone]

## 2017-09-25 ENCOUNTER — Telehealth: Payer: Self-pay | Admitting: Endocrinology

## 2017-10-01 ENCOUNTER — Telehealth: Payer: Self-pay | Admitting: *Deleted

## 2017-10-01 NOTE — Telephone Encounter (Signed)
Took call from phone staff. Spoke with Dr. Demetrius Revel with [facility]. He states they have been prescribing Tecfidera for the pt. She is un-enrolling in their program at the end of April and advised we will need to take over prescribing medication for her at that time. She is currently at [facility]. Phone: [phone]. They will provides refill until the end of April.  I asked when rx last filled/what specialty pharmacy they have been using. He stated for specific questions regarding medication I can call Carmell Austria at [phone] with [facility]. She should be able to address these questions.

## 2017-10-01 NOTE — Telephone Encounter (Signed)
Called pt. Asked if she had new insurance information. She does not have it at this time. She is also going to contact her Education officer, museum. She has Medicare/Medicare but will be getting Part D for prescription coverage. She will have Education officer, museum f/u.

## 2017-10-01 NOTE — Telephone Encounter (Signed)
I called Laura Roberts w/ [facility]. She is not sure what new insurance patient will be using after [facility]. She is going to contact pt Education officer, museum to see about getting this information.I asked her to fax copy of new insurance cards/rx insurance card to [phone] Laura Roberts if she can obtain a copy. Important to have on file since we are going to take over prescribing and will most likely have to do a PA on med. She verbalized understanding and will try to get this information.

## 2017-10-09 ENCOUNTER — Other Ambulatory Visit: Payer: Self-pay

## 2017-10-09 ENCOUNTER — Emergency Department (HOSPITAL_COMMUNITY): Payer: Medicare (Managed Care)

## 2017-10-09 ENCOUNTER — Encounter (HOSPITAL_COMMUNITY): Payer: Self-pay | Admitting: Emergency Medicine

## 2017-10-09 ENCOUNTER — Inpatient Hospital Stay (HOSPITAL_COMMUNITY)
Admission: EM | Admit: 2017-10-09 | Discharge: 2017-10-14 | DRG: 699 | Disposition: A | Discharge status: Skilled Nursing Facility | Payer: Medicare (Managed Care) | Attending: Internal Medicine | Admitting: Internal Medicine

## 2017-10-09 DIAGNOSIS — G35 Multiple sclerosis: Secondary | ICD-10-CM | POA: Diagnosis not present

## 2017-10-09 DIAGNOSIS — L89152 Pressure ulcer of sacral region, stage 2: Secondary | ICD-10-CM | POA: Diagnosis present

## 2017-10-09 DIAGNOSIS — E876 Hypokalemia: Secondary | ICD-10-CM | POA: Diagnosis not present

## 2017-10-09 DIAGNOSIS — L89310 Pressure ulcer of right buttock, unstageable: Secondary | ICD-10-CM | POA: Diagnosis present

## 2017-10-09 DIAGNOSIS — K592 Neurogenic bowel, not elsewhere classified: Secondary | ICD-10-CM | POA: Diagnosis present

## 2017-10-09 DIAGNOSIS — T83510A Infection and inflammatory reaction due to cystostomy catheter, initial encounter: Secondary | ICD-10-CM | POA: Diagnosis not present

## 2017-10-09 DIAGNOSIS — Z888 Allergy status to other drugs, medicaments and biological substances status: Secondary | ICD-10-CM

## 2017-10-09 DIAGNOSIS — E878 Other disorders of electrolyte and fluid balance, not elsewhere classified: Secondary | ICD-10-CM

## 2017-10-09 DIAGNOSIS — T83038A Leakage of other indwelling urethral catheter, initial encounter: Secondary | ICD-10-CM

## 2017-10-09 DIAGNOSIS — Z885 Allergy status to narcotic agent status: Secondary | ICD-10-CM

## 2017-10-09 DIAGNOSIS — N319 Neuromuscular dysfunction of bladder, unspecified: Secondary | ICD-10-CM | POA: Diagnosis present

## 2017-10-09 DIAGNOSIS — R319 Hematuria, unspecified: Secondary | ICD-10-CM | POA: Diagnosis present

## 2017-10-09 DIAGNOSIS — Z96653 Presence of artificial knee joint, bilateral: Secondary | ICD-10-CM | POA: Diagnosis present

## 2017-10-09 DIAGNOSIS — T83511A Infection and inflammatory reaction due to indwelling urethral catheter, initial encounter: Secondary | ICD-10-CM

## 2017-10-09 DIAGNOSIS — E119 Type 2 diabetes mellitus without complications: Secondary | ICD-10-CM | POA: Diagnosis present

## 2017-10-09 DIAGNOSIS — R079 Chest pain, unspecified: Secondary | ICD-10-CM | POA: Diagnosis not present

## 2017-10-09 DIAGNOSIS — I10 Essential (primary) hypertension: Secondary | ICD-10-CM | POA: Diagnosis present

## 2017-10-09 DIAGNOSIS — Y848 Other medical procedures as the cause of abnormal reaction of the patient, or of later complication, without mention of misadventure at the time of the procedure: Secondary | ICD-10-CM | POA: Diagnosis present

## 2017-10-09 DIAGNOSIS — D638 Anemia in other chronic diseases classified elsewhere: Secondary | ICD-10-CM | POA: Diagnosis present

## 2017-10-09 DIAGNOSIS — E1140 Type 2 diabetes mellitus with diabetic neuropathy, unspecified: Secondary | ICD-10-CM

## 2017-10-09 DIAGNOSIS — R0789 Other chest pain: Secondary | ICD-10-CM

## 2017-10-09 DIAGNOSIS — G839 Paralytic syndrome, unspecified: Secondary | ICD-10-CM | POA: Diagnosis not present

## 2017-10-09 DIAGNOSIS — Z960 Presence of urogenital implants: Secondary | ICD-10-CM | POA: Diagnosis not present

## 2017-10-09 DIAGNOSIS — Z833 Family history of diabetes mellitus: Secondary | ICD-10-CM

## 2017-10-09 DIAGNOSIS — D649 Anemia, unspecified: Secondary | ICD-10-CM

## 2017-10-09 DIAGNOSIS — Z87440 Personal history of urinary (tract) infections: Secondary | ICD-10-CM | POA: Diagnosis not present

## 2017-10-09 DIAGNOSIS — Z820 Family history of epilepsy and other diseases of the nervous system: Secondary | ICD-10-CM

## 2017-10-09 DIAGNOSIS — Z794 Long term (current) use of insulin: Secondary | ICD-10-CM

## 2017-10-09 DIAGNOSIS — Z9359 Other cystostomy status: Secondary | ICD-10-CM

## 2017-10-09 DIAGNOSIS — Z79899 Other long term (current) drug therapy: Secondary | ICD-10-CM

## 2017-10-09 DIAGNOSIS — L89610 Pressure ulcer of right heel, unstageable: Secondary | ICD-10-CM | POA: Diagnosis present

## 2017-10-09 DIAGNOSIS — N390 Urinary tract infection, site not specified: Secondary | ICD-10-CM

## 2017-10-09 DIAGNOSIS — L89322 Pressure ulcer of left buttock, stage 2: Secondary | ICD-10-CM | POA: Diagnosis present

## 2017-10-09 DIAGNOSIS — Z7952 Long term (current) use of systemic steroids: Secondary | ICD-10-CM

## 2017-10-09 DIAGNOSIS — T83030A Leakage of cystostomy catheter, initial encounter: Secondary | ICD-10-CM | POA: Diagnosis present

## 2017-10-09 DIAGNOSIS — G8220 Paraplegia, unspecified: Secondary | ICD-10-CM | POA: Diagnosis present

## 2017-10-09 DIAGNOSIS — R000 Tachycardia, unspecified: Secondary | ICD-10-CM | POA: Diagnosis present

## 2017-10-09 HISTORY — DX: Urinary tract infection, site not specified: N39.0

## 2017-10-09 LAB — CBC WITH DIFFERENTIAL/PLATELET
BASOS ABS: 0 10*3/uL (ref 0.0–0.1)
BASOS PCT: 0 %
Band Neutrophils: 0 %
Blasts: 0 %
Eosinophils Absolute: 0 10*3/uL (ref 0.0–0.7)
Eosinophils Relative: 0 %
HCT: 32.3 % — ABNORMAL LOW (ref 36.0–46.0)
Hemoglobin: 10.3 g/dL — ABNORMAL LOW (ref 12.0–15.0)
LYMPHS ABS: 1.8 10*3/uL (ref 0.7–4.0)
Lymphocytes Relative: 12 %
MCH: 25.8 pg — AB (ref 26.0–34.0)
MCHC: 31.9 g/dL (ref 30.0–36.0)
MCV: 81 fL (ref 78.0–100.0)
METAMYELOCYTES PCT: 0 %
MONO ABS: 1.2 10*3/uL — AB (ref 0.1–1.0)
MONOS PCT: 8 %
MYELOCYTES: 0 %
NEUTROS ABS: 12.3 10*3/uL — AB (ref 1.7–7.7)
NRBC: 0 /100{WBCs}
Neutrophils Relative %: 80 %
Other: 0 %
PLATELETS: 641 10*3/uL — AB (ref 150–400)
Promyelocytes Relative: 0 %
RBC: 3.99 MIL/uL (ref 3.87–5.11)
RDW: 19.2 % — AB (ref 11.5–15.5)
WBC: 15.3 10*3/uL — ABNORMAL HIGH (ref 4.0–10.5)

## 2017-10-09 LAB — URINALYSIS, ROUTINE W REFLEX MICROSCOPIC
Bilirubin Urine: NEGATIVE
Glucose, UA: NEGATIVE mg/dL
KETONES UR: 5 mg/dL — AB
Nitrite: NEGATIVE
Specific Gravity, Urine: 1.017 (ref 1.005–1.030)
pH: 6 (ref 5.0–8.0)

## 2017-10-09 LAB — I-STAT TROPONIN, ED: TROPONIN I, POC: 0.01 ng/mL (ref 0.00–0.08)

## 2017-10-09 LAB — BASIC METABOLIC PANEL
Anion gap: 13 (ref 5–15)
CALCIUM: 8.1 mg/dL — AB (ref 8.9–10.3)
CHLORIDE: 94 mmol/L — AB (ref 101–111)
CO2: 29 mmol/L (ref 22–32)
Creatinine, Ser: 0.51 mg/dL (ref 0.44–1.00)
GFR calc Af Amer: >60 mL/min (ref >60)
GFR calc non Af Amer: >60 mL/min (ref >60)
GLUCOSE: 143 mg/dL — AB (ref 65–99)
Potassium: 2.9 mmol/L — ABNORMAL LOW (ref 3.5–5.1)
Sodium: 136 mmol/L (ref 135–145)

## 2017-10-09 LAB — URINALYSIS, MICROSCOPIC (REFLEX): SQUAMOUS EPITHELIAL / LPF: NONE SEEN

## 2017-10-09 LAB — COMPREHENSIVE METABOLIC PANEL
ALBUMIN: 2.4 g/dL — AB (ref 3.5–5.0)
ALT: 10 U/L — AB (ref 14–54)
AST: 11 U/L — AB (ref 15–41)
Alkaline Phosphatase: 78 U/L (ref 38–126)
Anion gap: 16 — ABNORMAL HIGH (ref 5–15)
BUN: 9 mg/dL (ref 6–20)
CHLORIDE: 87 mmol/L — AB (ref 101–111)
CO2: 34 mmol/L — AB (ref 22–32)
CREATININE: 0.59 mg/dL (ref 0.44–1.00)
Calcium: 9 mg/dL (ref 8.9–10.3)
GFR calc Af Amer: >60 mL/min (ref >60)
GFR calc non Af Amer: >60 mL/min (ref >60)
GLUCOSE: 137 mg/dL — AB (ref 65–99)
Potassium: <2 mmol/L — CL (ref 3.5–5.1)
SODIUM: 137 mmol/L (ref 135–145)
Total Bilirubin: 1.1 mg/dL (ref 0.3–1.2)
Total Protein: 6.2 g/dL — ABNORMAL LOW (ref 6.5–8.1)

## 2017-10-09 LAB — I-STAT CG4 LACTIC ACID, ED: Lactic Acid, Venous: 1.2 mmol/L (ref 0.5–1.9)

## 2017-10-09 LAB — MAGNESIUM: Magnesium: 1.5 mg/dL — ABNORMAL LOW (ref 1.7–2.4)

## 2017-10-09 LAB — GLUCOSE, CAPILLARY: Glucose-Capillary: 143 mg/dL — ABNORMAL HIGH (ref 65–99)

## 2017-10-09 MED ORDER — VANCOMYCIN HCL IN DEXTROSE 1-5 GM/200ML-% IV SOLN
1000.0000 mg | Freq: Once | INTRAVENOUS | Status: AC
  Administered 2017-10-09: 1000 mg via INTRAVENOUS
  Filled 2017-10-09: qty 200

## 2017-10-09 MED ORDER — ZINC OXIDE 11.3 % EX CREA
TOPICAL_CREAM | Freq: Two times a day (BID) | CUTANEOUS | Status: DC
  Administered 2017-10-09 – 2017-10-14 (×10): via TOPICAL
  Filled 2017-10-09 (×2): qty 56

## 2017-10-09 MED ORDER — VITAMIN D 1000 UNITS PO TABS
1000.0000 [IU] | ORAL_TABLET | Freq: Every day | ORAL | Status: DC
  Administered 2017-10-09 – 2017-10-14 (×6): 1000 [IU] via ORAL
  Filled 2017-10-09 (×6): qty 1

## 2017-10-09 MED ORDER — POLYETHYLENE GLYCOL 3350 17 G PO PACK: 17.0000 g | PACK | Freq: Every day | ORAL | Status: DC | PRN

## 2017-10-09 MED ORDER — PREDNISONE 2.5 MG PO TABS: 2.5000 mg | ORAL_TABLET | ORAL | Status: DC

## 2017-10-09 MED ORDER — ACETAMINOPHEN 325 MG PO TABS
650.0000 mg | ORAL_TABLET | Freq: Four times a day (QID) | ORAL | Status: DC | PRN
  Administered 2017-10-11: 650 mg via ORAL
  Filled 2017-10-09: qty 2

## 2017-10-09 MED ORDER — POTASSIUM CHLORIDE 10 MEQ/100ML IV SOLN
10.0000 meq | INTRAVENOUS | Status: AC
  Administered 2017-10-09 (×4): 10 meq via INTRAVENOUS
  Filled 2017-10-09 (×4): qty 100

## 2017-10-09 MED ORDER — CALCIUM CITRATE 950 (200 CA) MG PO TABS
200.0000 mg | ORAL_TABLET | Freq: Every day | ORAL | Status: DC
  Administered 2017-10-09 – 2017-10-14 (×6): 200 mg via ORAL
  Filled 2017-10-09 (×6): qty 1

## 2017-10-09 MED ORDER — SODIUM CHLORIDE 0.9 % IV BOLUS
1000.0000 mL | Freq: Once | INTRAVENOUS | Status: AC
  Administered 2017-10-09: 1000 mL via INTRAVENOUS

## 2017-10-09 MED ORDER — PETROLATUM-ZINC OXIDE 49-15 % EX OINT: 1.0000 "application " | TOPICAL_OINTMENT | Freq: Two times a day (BID) | CUTANEOUS | Status: DC

## 2017-10-09 MED ORDER — SENNOSIDES-DOCUSATE SODIUM 8.6-50 MG PO TABS
2.0000 | ORAL_TABLET | Freq: Every day | ORAL | Status: DC
  Administered 2017-10-09 – 2017-10-14 (×4): 2 via ORAL
  Filled 2017-10-09 (×5): qty 2

## 2017-10-09 MED ORDER — MAGNESIUM SULFATE 2 GM/50ML IV SOLN
2.0000 g | Freq: Once | INTRAVENOUS | Status: AC
  Administered 2017-10-10: 2 g via INTRAVENOUS
  Filled 2017-10-09: qty 50

## 2017-10-09 MED ORDER — POTASSIUM CHLORIDE CRYS ER 20 MEQ PO TBCR
40.0000 meq | EXTENDED_RELEASE_TABLET | ORAL | Status: AC
  Administered 2017-10-09 – 2017-10-10 (×2): 40 meq via ORAL
  Filled 2017-10-09 (×2): qty 2

## 2017-10-09 MED ORDER — ENOXAPARIN SODIUM 40 MG/0.4ML ~~LOC~~ SOLN
40.0000 mg | SUBCUTANEOUS | Status: DC
  Administered 2017-10-09 – 2017-10-14 (×6): 40 mg via SUBCUTANEOUS
  Filled 2017-10-09 (×7): qty 0.4

## 2017-10-09 MED ORDER — ONDANSETRON HCL 4 MG/2ML IJ SOLN: 4.0000 mg | Freq: Four times a day (QID) | INTRAMUSCULAR | Status: DC | PRN

## 2017-10-09 MED ORDER — BACLOFEN 10 MG PO TABS
10.0000 mg | ORAL_TABLET | ORAL | Status: DC
  Administered 2017-10-10 – 2017-10-14 (×6): 10 mg via ORAL
  Filled 2017-10-09 (×5): qty 1

## 2017-10-09 MED ORDER — POTASSIUM CHLORIDE CRYS ER 20 MEQ PO TBCR
40.0000 meq | EXTENDED_RELEASE_TABLET | Freq: Once | ORAL | Status: AC
Start: 2017-10-09 — End: 2017-10-09
  Administered 2017-10-09: 40 meq via ORAL
  Filled 2017-10-09: qty 2

## 2017-10-09 MED ORDER — BACLOFEN 5 MG HALF TABLET: 10.0000 mg | ORAL_TABLET | ORAL | Status: DC

## 2017-10-09 MED ORDER — BACLOFEN 10 MG PO TABS
15.0000 mg | ORAL_TABLET | Freq: Every day | ORAL | Status: DC
  Administered 2017-10-10 – 2017-10-14 (×5): 15 mg via ORAL
  Filled 2017-10-09 (×5): qty 2

## 2017-10-09 MED ORDER — VANCOMYCIN HCL IN DEXTROSE 750-5 MG/150ML-% IV SOLN
750.0000 mg | Freq: Two times a day (BID) | INTRAVENOUS | Status: DC
  Filled 2017-10-09: qty 150

## 2017-10-09 MED ORDER — ACETAMINOPHEN 650 MG RE SUPP: 650.0000 mg | Freq: Four times a day (QID) | RECTAL | Status: DC | PRN

## 2017-10-09 MED ORDER — SODIUM CHLORIDE 0.9 % IV SOLN
2.0000 g | Freq: Once | INTRAVENOUS | Status: AC
  Administered 2017-10-09: 2 g via INTRAVENOUS
  Filled 2017-10-09: qty 2

## 2017-10-09 MED ORDER — PREDNISONE 5 MG PO TABS
5.0000 mg | ORAL_TABLET | ORAL | Status: DC
  Administered 2017-10-09 – 2017-10-13 (×3): 5 mg via ORAL
  Filled 2017-10-09 (×4): qty 1

## 2017-10-09 MED ORDER — DULOXETINE HCL 60 MG PO CPEP
90.0000 mg | ORAL_CAPSULE | Freq: Every day | ORAL | Status: DC
  Administered 2017-10-10 – 2017-10-14 (×5): 90 mg via ORAL
  Filled 2017-10-09 (×5): qty 1

## 2017-10-09 MED ORDER — SODIUM CHLORIDE 0.9 % IV SOLN
1.0000 g | INTRAVENOUS | Status: DC
  Administered 2017-10-09 – 2017-10-14 (×5): 1 g via INTRAVENOUS
  Filled 2017-10-09 (×6): qty 10

## 2017-10-09 MED ORDER — BACLOFEN 10 MG PO TABS
20.0000 mg | ORAL_TABLET | Freq: Every day | ORAL | Status: DC
  Administered 2017-10-09 – 2017-10-14 (×6): 20 mg via ORAL
  Filled 2017-10-09 (×6): qty 2

## 2017-10-09 MED ORDER — DIMETHYL FUMARATE 240 MG PO CPDR
240.0000 mg | DELAYED_RELEASE_CAPSULE | Freq: Two times a day (BID) | ORAL | Status: DC
  Administered 2017-10-09 – 2017-10-14 (×9): 240 mg via ORAL
  Filled 2017-10-09 (×9): qty 1

## 2017-10-09 MED ORDER — PRO-STAT SUGAR FREE PO LIQD
30.0000 mL | Freq: Two times a day (BID) | ORAL | Status: DC
  Administered 2017-10-09 – 2017-10-10 (×2): 30 mL via ORAL
  Filled 2017-10-09 (×3): qty 30

## 2017-10-09 MED ORDER — HYDROCERIN EX CREA
1.0000 "application " | TOPICAL_CREAM | Freq: Two times a day (BID) | CUTANEOUS | Status: DC
  Administered 2017-10-09 – 2017-10-13 (×8): 1 via TOPICAL
  Filled 2017-10-09 (×2): qty 113

## 2017-10-09 MED ORDER — INSULIN GLARGINE 100 UNIT/ML ~~LOC~~ SOLN
15.0000 [IU] | Freq: Every day | SUBCUTANEOUS | Status: DC
  Administered 2017-10-09 – 2017-10-14 (×6): 15 [IU] via SUBCUTANEOUS
  Filled 2017-10-09 (×6): qty 0.15

## 2017-10-09 MED ORDER — SODIUM CHLORIDE 0.9 % IV SOLN
1.0000 g | Freq: Three times a day (TID) | INTRAVENOUS | Status: DC
  Filled 2017-10-09 (×3): qty 1

## 2017-10-09 MED ORDER — ACETAMINOPHEN 325 MG PO TABS
650.0000 mg | ORAL_TABLET | Freq: Once | ORAL | Status: AC
  Administered 2017-10-09: 650 mg via ORAL
  Filled 2017-10-09: qty 2

## 2017-10-09 MED ORDER — PANTOPRAZOLE SODIUM 40 MG PO TBEC
40.0000 mg | DELAYED_RELEASE_TABLET | Freq: Every day | ORAL | Status: DC
  Administered 2017-10-10 – 2017-10-14 (×5): 40 mg via ORAL
  Filled 2017-10-09 (×5): qty 1

## 2017-10-09 MED ORDER — PREDNISONE 1 MG PO TABS
2.0000 mg | ORAL_TABLET | ORAL | Status: DC
  Administered 2017-10-10 – 2017-10-14 (×3): 2 mg via ORAL
  Filled 2017-10-09 (×3): qty 2

## 2017-10-09 MED ORDER — GABAPENTIN 300 MG PO CAPS
300.0000 mg | ORAL_CAPSULE | Freq: Two times a day (BID) | ORAL | Status: DC
  Administered 2017-10-09 – 2017-10-14 (×11): 300 mg via ORAL
  Filled 2017-10-09 (×11): qty 1

## 2017-10-09 MED ORDER — FESOTERODINE FUMARATE ER 4 MG PO TB24
4.0000 mg | ORAL_TABLET | Freq: Every day | ORAL | Status: DC
  Administered 2017-10-09 – 2017-10-14 (×6): 4 mg via ORAL
  Filled 2017-10-09 (×7): qty 1

## 2017-10-09 MED ORDER — INSULIN ASPART 100 UNIT/ML ~~LOC~~ SOLN
0.0000 [IU] | Freq: Three times a day (TID) | SUBCUTANEOUS | Status: DC
  Administered 2017-10-10: 2 [IU] via SUBCUTANEOUS
  Administered 2017-10-10: 1 [IU] via SUBCUTANEOUS
  Administered 2017-10-10: 5 [IU] via SUBCUTANEOUS
  Administered 2017-10-11 (×3): 2 [IU] via SUBCUTANEOUS
  Administered 2017-10-12 (×2): 5 [IU] via SUBCUTANEOUS
  Administered 2017-10-13: 1 [IU] via SUBCUTANEOUS

## 2017-10-09 NOTE — ED Notes (Signed)
[facility] and they will bring Tecfidera for pharmacy to dispense

## 2017-10-09 NOTE — ED Notes (Signed)
Tecfidera brought to this RN, verified and counted with pt. med's given to pharmacy

## 2017-10-09 NOTE — Progress Notes (Signed)
Pharmacy Antibiotic Note  Laura Roberts is a 77 y.o. female admitted on 10/09/2017 with sepsis.  Pharmacy has been consulted for vancomycin and cefepime dosing.  Patient with MS and paraplegia. Vancomycin 1g IV and cefepime 2g IV each x1 ordered by EDP.  SCr 0.59 which appears to be around baseline.  Plan: Vancomycin 750mg  IV q12h Cefepime 1g IV q8h Follow c/s, clinical progression, renal function, level PRN    Temp (24hrs), Avg:100.3 F (37.9 C), Min:99.7 F (37.6 C), Max:100.9 F (38.3 C)  No results for input(s): WBC, CREATININE, LATICACIDVEN, VANCOTROUGH, VANCOPEAK, VANCORANDOM, GENTTROUGH, GENTPEAK, GENTRANDOM, TOBRATROUGH, TOBRAPEAK, TOBRARND, AMIKACINPEAK, AMIKACINTROU, AMIKACIN in the last 168 hours.  CrCl cannot be calculated (Patient's most recent lab result is older than the maximum 21 days allowed.).    Allergies  Allergen Reactions  . Codeine Other (See Comments)    Difficult breathing and skin problem  . Ultram [Tramadol] Other (See Comments)    Difficult breathing and skin peeling  . Januvia [Sitagliptin] Rash    Blisters    Antimicrobials this admission: Vancomycin 4/11 >>  Cefepime 4/11 >>   Dose adjustments this admission: n/a  Microbiology results: 4/11 BCx:  4/11 UCx:    Thank you for allowing pharmacy to be a part of this patient's care.  Vonne Mcdanel D. Brenen Beigel, PharmD, BCPS Clinical Pharmacist Clinical Phone for 10/09/2017 until 3:30pm: [phone] If after 3:30pm, please call main pharmacy at [phone] 10/09/2017 12:06 PM

## 2017-10-09 NOTE — H&P (Addendum)
Date: 10/09/2017               Patient Name:  Laura Roberts MRN: [id number]  DOB: [birth year] Age / Sex: 77 y.o., female   PCP: [name], MD         Medical Service: Internal Medicine Teaching Service         Attending Physician: Dr. Annia Belt, MD    First Contact: Dr. Vickki Muff Pager: [phone]  Second Contact: Dr. Kalman Shan Pager: [phone]       After Hours (After 5p/  First Contact Pager: [phone]  weekends / holidays): Second Contact Pager: [phone]   Chief Complaint: suprapubic pain, chest pain  History of Present Illness: 77 year old female with past medical history of MS, T2 DM, neurogenic bladder status post suprapubic catheter insertion 1 month ago.  The patient presents from [facility] facility for signs and symptoms concerning for a recurrent cystitis versus suprapubic catheter site infection.  The patient reports she had a fever on Wednesday and noted by staff to be 101.  She has felt poorly since then felt as if she has had chills.  She reports nausea and poor appetite and had a vomiting episode yesterday.  She denies any cough or sick contacts, she has had no dizziness or shortness of breath.  Her main complaint today is suprapubic abdominal pain.  She says that the pain has become more and more worrisome.  She states she does not have been feeling below the waist due to her MS. she also says she woke up this morning with chest pain which she has never had before, it was not pleuritic, it was nonradiating, is not positional.  In the ED she was given Maalox with resolution of the pain.  She has had no recurrence of the pain since being in the ED.  The patient has been very depressed due to her chronic medical conditions and progressive decline.    ED course: arrived stable with normal bp but tachycardia, rectal temp 100.9, oral 98.8, mag of 1.5, K of 2.0, troponin negative, Her UA shows TNTC white and red cells, Moderate leukocytes, Protein >300.   Blood cultures drawn, lactate normal, WBC 15.3k, Hgb 10.3, chest xray interpretation below, ECG below.  She was started on vanc cefepime and 5 runs of potassium were started.    Meds:  Current Meds  Medication Sig  . acetaminophen (TYLENOL) 325 MG tablet Take 650 mg by mouth 3 (three) times daily.   . Amino Acids-Protein Hydrolys (FEEDING SUPPLEMENT, PRO-STAT SUGAR FREE 64,) LIQD Take 30 mLs by mouth 2 (two) times daily.  Marland Kitchen amLODipine (NORVASC) 10 MG tablet Take 10 mg by mouth at bedtime.   . ARTIFICIAL TEAR OP Apply 1 drop to eye 4 (four) times daily as needed (dry eyes).   . baclofen (LIORESAL) 10 MG tablet 1.5 tablet in the morning, 1 tablet midday, two tablets at night (Patient taking differently: Take 10-20 mg by mouth See admin instructions. 15 mg  tablet in the morning, 10 mg  tablet midday, 20 mg tablets at night)  . calcium citrate (CALCITRATE - DOSED IN MG ELEMENTAL CALCIUM) 950 MG tablet Take 200 mg of elemental calcium by mouth daily.  . cholecalciferol (VITAMIN D) 1000 UNITS tablet Take 1,000 Units by mouth daily.  . Dimethyl Fumarate (TECFIDERA) 240 MG CPDR Take 1 capsule (240 mg total) by mouth 2 (two) times daily.  . DULoxetine (CYMBALTA) 30 MG capsule Take 90 mg by  mouth daily.   Marland Kitchen gabapentin (NEURONTIN) 300 MG capsule Take 1 capsule (300 mg total) by mouth 2 (two) times daily.  . insulin glargine (LANTUS) 100 UNIT/ML injection Inject 25 Units into the skin at bedtime.   . Lidocaine (ASPERCREME LIDOCAINE) 4 % PTCH Apply 1 patch topically daily as needed (To painful areas).  Marland Kitchen omeprazole (PRILOSEC) 20 MG capsule Take 20 mg by mouth daily.  Marland Kitchen Petrolatum-Zinc Oxide (SENSI-CARE PROTECTIVE BARRIER) 49-15 % OINT Apply 1 application topically 2 (two) times daily. Apply to buttocks and inner thighs  . polyethylene glycol (MIRALAX / GLYCOLAX) packet Take 17 g by mouth daily as needed for mild constipation.   . predniSONE (DELTASONE) 5 MG tablet 1 tablet on odd days, one half tablet on  even days (Patient taking differently: Take 2.5-5 mg by mouth See admin instructions. 1 tablet (5mg ) on odd days, one half (2.5mg ) tablet on even days)  . senna-docusate (SENOKOT-S) 8.6-50 MG tablet Take 2 tablets by mouth at bedtime.  . Skin Protectants, Misc. (EUCERIN) cream Apply 1 application topically 2 (two) times daily.  Marland Kitchen tolterodine (DETROL LA) 4 MG 24 hr capsule Take 4 mg by mouth daily.     Allergies: Allergies as of 10/09/2017 - Review Complete 10/09/2017  Allergen Reaction Noted  . Codeine Other (See Comments) 07/21/2012  . Ultram [tramadol] Other (See Comments) 07/21/2012  . Januvia [sitagliptin] Rash 06/03/2016   Past Medical History:  Diagnosis Date  . Degenerative arthritis   . Diabetes mellitus without complication ([place])   . Gait disturbance   . Hypertension   . Multiple sclerosis ([place])   . Neuromuscular disorder (HCC)    Bilateral hand carpal tunnel syndrome  . Obesity   . Spinal stenosis in cervical region   . Spinal stenosis of lumbosacral region   . Spinal stenosis, thoracic     Family History:  Family History  Problem Relation Age of Onset  . Multiple sclerosis Other        neices.   . Cancer Mother   . Diabetes Mother   . GI Bleed Sister        diverticulitis    Social History:  Social History   Socioeconomic History  . Marital status: Widowed    Spouse name: Not on file  . Number of children: 2  . Years of education: 56yr colleg  . Highest education level: Not on file  Occupational History  . Occupation: N/A    Employer: [employer]    Comment: NIGHT RES[employer]  Social Needs  . Financial resource strain: Not on file  . Food insecurity:    Worry: Not on file    Inability: Not on file  . Transportation needs:    Medical: Not on file    Non-medical: Not on file  Tobacco Use  . Smoking status: Never Smoker  . Smokeless tobacco: Never Used  Substance and Sexual Activity  . Alcohol use: No  . Drug use: No  . Sexual activity:  Never  Lifestyle  . Physical activity:    Days per week: Not on file    Minutes per session: Not on file  . Stress: Not on file  Relationships  . Social connections:    Talks on phone: Not on file    Gets together: Not on file    Attends religious service: Not on file    Active member of club or organization: Not on file    Attends meetings of clubs or organizations: Not  on file    Relationship status: Not on file  . Intimate partner violence:    Fear of current or ex partner: Not on file    Emotionally abused: Not on file    Physically abused: Not on file    Forced sexual activity: Not on file  Other Topics Concern  . Not on file  Social History Narrative   Patient is widowed with 2 children   Patient is right handed   Patient has 2 yrs of college   Patient drinks 2-3 cups of tea daily   Urinalysis    Component Value Date/Time   COLORURINE YELLOW 10/09/2017 0930   APPEARANCEUR TURBID (A) 10/09/2017 0930   LABSPEC 1.017 10/09/2017 0930   PHURINE 6.0 10/09/2017 0930   GLUCOSEU NEGATIVE 10/09/2017 0930   HGBUR LARGE (A) 10/09/2017 0930   BILIRUBINUR NEGATIVE 10/09/2017 0930   KETONESUR 5 (A) 10/09/2017 0930   PROTEINUR >=300 (A) 10/09/2017 0930   UROBILINOGEN 1.0 08/04/2012 1938   NITRITE NEGATIVE 10/09/2017 0930   LEUKOCYTESUR MODERATE (A) 10/09/2017 0930     Review of Systems: A complete ROS was negative except as per HPI.   Physical Exam: Blood pressure 116/60, pulse (!) 113, temperature 98.8 F (37.1 C), temperature source Oral, resp. rate 17, height 5\' 2"  (1.575 m), weight 186 lb (84.4 kg), SpO2 100 %. Physical Exam  Constitutional: She is oriented to person, place, and time. No distress.  HENT:  Head: Normocephalic and atraumatic.  Neck: No thyromegaly present.  Cardiovascular: Regular rhythm and normal heart sounds. Tachycardia present. Exam reveals no gallop and no friction rub.  No murmur heard. Pulmonary/Chest: Effort normal and breath sounds normal.  No respiratory distress. She has no wheezes. She has no rales. She exhibits no tenderness.  Abdominal: Soft. Bowel sounds are normal. She exhibits no distension and no mass. There is tenderness (extreme suprapubic tenderness on palpation, there is a bandage around catheter with dried blood no active drainage appreciated but erythematous). There is no rebound and no guarding.  Musculoskeletal:       Right lower leg: She exhibits edema (trace in leg 1-2+ in feet).       Left lower leg: She exhibits edema (trace in leg 1-2+ in feet).  Neurological: She is alert and oriented to person, place, and time.  Skin: She is not diaphoretic.  Psychiatric: She exhibits a depressed mood.    EKG: personally reviewed my interpretation is sinus tachycardia, flattened T waves, and likely U waves  CXR: personally reviewed my interpretation is no acute cardiopulmonary disease  Assessment & Plan by Problem: Active Problems:   UTI (urinary tract infection)  77 yo female with MS associated neurogenic bladder and paraplegia, T2DM, presents with a recurrent UTI and possible catheter site infection 2/2 chronic indwelling suprapubic catheter placed 1 month prior and also noted to have severe hypokalemia with ecg changes  UTI with possible Suprapubic Catheter site infection: UA and pts symptoms concerning for a recurrent UTI vs catheter site infection.  No pus seen or expressed from catheter site but exam limited by pts extreme pain.  Large pannus overlying catheter site.    -urine culture -narrowed to ceftriaxone -wound care consulted  Chest Pain: likely non cardiac, not consistent with anginal symptoms, resolved long before our encounter with the patient. Troponins neg, ecg not consistent with ACS.  Symptoms more consistent with GERD  -repeat ecg in am -continuous cardiac monitoring -continue pantoprazole  Hypokalemia: severe less than 2.0 was started on IV  potassium runs in ED with ecg changes   -continuous  cardiac monitoring -repeat ecg in am -follow up bmets and continue to replete -replacing magnesium as well  T2DM: on lantus 25 units daily at her facility  -will begin with lantus 15 units as pt with poor appetite -SSI thin  MS: followed by [facility], disease seems to be stable, major complications are paraplegia and neurogenic bowel and bladder  -continue tecfidera -low dose prednisone 5mg  every other day  Normocytic anemia: pt with anemia of chronic disease, also with hematuria. Hgb down 1g from one month ago  -will need monitoring of hematuria to ensure resolution when infection treated -CBC's  Dispo: Admit patient to Inpatient with expected length of stay greater than 2 midnights.  Signed: Katherine Roan, MD 10/09/2017, 4:58 PM  Vickki Muff MD PGY-1 Internal Medicine Pager # [phone]

## 2017-10-09 NOTE — ED Notes (Addendum)
Pt covered in stool, urostomy bag about to burst. Pt cleaned up and urostomy bag emptied. New diaper and linens.

## 2017-10-09 NOTE — ED Triage Notes (Signed)
Per EMS: pt is a paraplegic from [facility] with c/o chest pain that began this AM.  Pt currently pain free on arrival. EMS administered 324 ASA pta.  Pt has a suprapubic catheter.

## 2017-10-09 NOTE — Progress Notes (Addendum)
Pharmacy Antibiotic Note  Laura Roberts is a 77 y.o. female admitted on 10/09/2017 with UTI. Originally started on vancomycin and cefepime. Pharmacy now consulted to discontinue vancomycin and cefepime and to dose ceftriaxone.   Patient with MS and paraplegia. Vancomycin 1g IV and cefepime 2g IV each x1 given in ED.  SCr 0.59 which appears to be around baseline.  Plan: Ceftriaxone 1g every 24 hours Follow c/s, clinical progression, renal function Height: 5\' 2"  (157.5 cm) Weight: 186 lb (84.4 kg) IBW/kg (Calculated) : 50.1  Temp (24hrs), Avg:99.8 F (37.7 C), Min:98.8 F (37.1 C), Max:100.9 F (38.3 C)  Recent Labs  Lab 10/09/17 0930 10/09/17 1008  WBC 15.3*  --   CREATININE 0.59  --   LATICACIDVEN  --  1.20    Estimated Creatinine Clearance: 62.1 mL/min (by C-G formula based on SCr of 0.59 mg/dL).    Allergies  Allergen Reactions  . Codeine Other (See Comments)    Difficult breathing and skin problem  . Ultram [Tramadol] Other (See Comments)    Difficult breathing and skin peeling  . Januvia [Sitagliptin] Rash    Blisters    Antimicrobials this admission: Vancomycin 4/11 x 1 Cefepime 4/11 x 1 Ceftriaxone 4/11 >>  Dose adjustments this admission: n/a  Microbiology results: 4/11 BCx: sent 4/11 UCx: sent   Thank you for allowing pharmacy to be a part of this patient's care.  Angus Seller, PharmD Pharmacy Resident Clinical Phone for 10/09/2017 until 3:30pm: [phone] If after 3:30pm, please call main pharmacy at [phone] 10/09/2017 6:35 PM

## 2017-10-09 NOTE — ED Provider Notes (Signed)
[facility] EMERGENCY DEPARTMENT Provider Note   CSN: [id number] Arrival date & time: 10/09/17  5573     History   Chief Complaint Chief Complaint  Patient presents with  . Chest Pain    HPI Laura Roberts is a 77 y.o. female.  HPI  77 year old female with a history of diabetes and multiple sclerosis who has a suprapubic catheter presents with chest pain.  This morning around 4 AM she had chest pain she is not sure if it woke her up or she woke up and then had chest pain.  She states it was sharp and stabbing across her chest.  She has been having a cough for about a week but denies shortness of breath.  She thinks she has had a fever during this time as well and her urine has been darker.  When she gets fevers it is usually from the urine according to her.  She states that the chest pain seemed to get better when the facility gave her Mylanta and she was given 4 baby aspirin by EMS.  She currently has no chest pain.  She has chronic abdominal discomfort since having a suprapubic catheter placed but no new abdominal pain.  Has been having leg swelling for quite some time that is unchanged.  Past Medical History:  Diagnosis Date  . Degenerative arthritis   . Diabetes mellitus without complication ([place])   . Gait disturbance   . Hypertension   . Multiple sclerosis ([place])   . Neuromuscular disorder ([facility])    Bilateral hand carpal tunnel syndrome  . Obesity   . Spinal stenosis in cervical region   . Spinal stenosis of lumbosacral region   . Spinal stenosis, thoracic     Patient Active Problem List   Diagnosis Date Noted  . UTI (urinary tract infection) 10/09/2017  . Febrile illness   . UTI (urinary tract infection) due to urinary indwelling Foley catheter ([place]) 08/13/2017  . Decubitus ulcer of buttock, stage 4 ([place]) 11/22/2016  . Sepsis ([place]) 11/22/2016  . Leukocytosis   . Paraplegia ([place]) 06/01/2014  . Closed supracondylar fracture of right femur,  periprosthetic 06/01/2014  . Femoral fracture ([place]) 06/01/2014  . Encounter for therapeutic drug monitoring 01/05/2014  . Foot drop, bilateral 10/13/2012  . Abnormality of gait 10/13/2012  . Neurogenic bladder 09/02/2012  . Neurogenic pain of lower extremity 09/02/2012  . Weakness 07/21/2012  . HTN (hypertension) 07/21/2012  . DM (diabetes mellitus) ([place]) 07/21/2012  . HLD (hyperlipidemia) 07/21/2012  . Tachycardia 07/21/2012  . GERD (gastroesophageal reflux disease) 07/21/2012  . Multiple sclerosis ([place])     Past Surgical History:  Procedure Laterality Date  . ABDOMINAL HYSTERECTOMY    . BACK SURGERY    . CATARACT EXTRACTION Right   . CHOLECYSTECTOMY    . FEMUR IM NAIL Right 06/02/2014   Procedure: INTRAMEDULLARY (IM) RETROGRADE FEMORAL NAILING;  Surgeon: Johnny Bridge, MD;  Location: [facility];  Service: Orthopedics;  Laterality: Right;  . left hand carpal tunnel surgery    . REPLACEMENT TOTAL KNEE BILATERAL  2004     OB History   None      Home Medications    Prior to Admission medications   Medication Sig Start Date End Date Taking? Authorizing Provider  acetaminophen (TYLENOL) 325 MG tablet Take 650 mg by mouth 3 (three) times daily.    Yes [provider]  Amino Acids-Protein Hydrolys (FEEDING SUPPLEMENT, PRO-STAT SUGAR FREE 64,) LIQD Take 30 mLs by mouth  2 (two) times daily. 11/26/16  Yes Minus Liberty, MD  amLODipine (NORVASC) 10 MG tablet Take 10 mg by mouth at bedtime.    Yes [provider]  ARTIFICIAL TEAR OP Apply 1 drop to eye 4 (four) times daily as needed (dry eyes).    Yes [provider]  baclofen (LIORESAL) 10 MG tablet 1.5 tablet in the morning, 1 tablet midday, two tablets at night Patient taking differently: Take 10-20 mg by mouth See admin instructions. 15 mg  tablet in the morning, 10 mg  tablet midday, 20 mg tablets at night 11/29/15  Yes Millikan, Megan, NP  calcium citrate (CALCITRATE - DOSED IN MG ELEMENTAL CALCIUM)  950 MG tablet Take 200 mg of elemental calcium by mouth daily.   Yes [provider]  cholecalciferol (VITAMIN D) 1000 UNITS tablet Take 1,000 Units by mouth daily.   Yes [provider]  Dimethyl Fumarate (TECFIDERA) 240 MG CPDR Take 1 capsule (240 mg total) by mouth 2 (two) times daily. 03/13/16  Yes Kathrynn Ducking, MD  DULoxetine (CYMBALTA) 30 MG capsule Take 90 mg by mouth daily.    Yes [provider]  gabapentin (NEURONTIN) 300 MG capsule Take 1 capsule (300 mg total) by mouth 2 (two) times daily. 08/18/17  Yes Colbert Ewing, MD  insulin glargine (LANTUS) 100 UNIT/ML injection Inject 25 Units into the skin at bedtime.    Yes [provider]  Lidocaine (ASPERCREME LIDOCAINE) 4 % PTCH Apply 1 patch topically daily as needed (To painful areas).   Yes [provider]  omeprazole (PRILOSEC) 20 MG capsule Take 20 mg by mouth daily.   Yes [provider]  Petrolatum-Zinc Oxide (SENSI-CARE PROTECTIVE BARRIER) 49-15 % OINT Apply 1 application topically 2 (two) times daily. Apply to buttocks and inner thighs   Yes [provider]  polyethylene glycol (MIRALAX / GLYCOLAX) packet Take 17 g by mouth daily as needed for mild constipation.    Yes [provider]  predniSONE (DELTASONE) 5 MG tablet 1 tablet on odd days, one half tablet on even days Patient taking differently: Take 2.5-5 mg by mouth See admin instructions. 1 tablet (5mg ) on odd days, one half (2.5mg ) tablet on even days 11/22/14  Yes Kathrynn Ducking, MD  senna-docusate (SENOKOT-S) 8.6-50 MG tablet Take 2 tablets by mouth at bedtime.   Yes [provider]  Skin Protectants, Misc. (EUCERIN) cream Apply 1 application topically 2 (two) times daily.   Yes [provider]  tolterodine (DETROL LA) 4 MG 24 hr capsule Take 4 mg by mouth daily.   Yes [provider]    Family History Family History  Problem Relation Age of Onset  . Multiple  sclerosis Other        neices.   . Cancer Mother   . Diabetes Mother   . GI Bleed Sister        diverticulitis    Social History Social History   Tobacco Use  . Smoking status: Never Smoker  . Smokeless tobacco: Never Used  Substance Use Topics  . Alcohol use: No  . Drug use: No     Allergies   Codeine; Ultram [tramadol]; and Januvia [sitagliptin]   Review of Systems Review of Systems  Constitutional: Positive for fever.  Respiratory: Positive for cough. Negative for shortness of breath.   Cardiovascular: Positive for chest pain and leg swelling.  Gastrointestinal: Positive for abdominal pain and vomiting (over past week).  Genitourinary: Positive for decreased urine volume.  All other systems reviewed and are negative.    Physical Exam Updated Vital Signs BP 116/60   Pulse (!) 113   Temp 98.8 F (37.1 C) (Oral)   Resp 17   Ht 5\' 2"  (1.575 m)   Wt 84.4 kg (186 lb)   SpO2 100%   BMI 34.02 kg/m   Physical Exam  Constitutional: She is oriented to person, place, and time. She appears well-developed and well-nourished.  Non-toxic appearance. She does not appear ill. No distress.  Feels warm  HENT:  Head: Normocephalic and atraumatic.  Right Ear: External ear normal.  Left Ear: External ear normal.  Nose: Nose normal.  Eyes: Right eye exhibits no discharge. Left eye exhibits no discharge.  Cardiovascular: Regular rhythm and normal heart sounds. Tachycardia present.  Pulmonary/Chest: Effort normal. She has rales in the right lower field and the left lower field.  Abdominal: Soft. There is tenderness in the suprapubic area.    Musculoskeletal:       Right lower leg: She exhibits edema.       Left lower leg: She exhibits edema.  Neurological: She is alert and oriented to person, place, and time.  Skin: Skin is warm and dry.  Nursing note and vitals reviewed.    ED Treatments / Results  Labs (all labs ordered are listed, but only abnormal results are  displayed) Labs Reviewed  COMPREHENSIVE METABOLIC PANEL - Abnormal; Notable for the following components:      Result Value   Potassium <2.0 (*)    Chloride 87 (*)    CO2 34 (*)    Glucose, Bld 137 (*)    Total Protein 6.2 (*)    Albumin 2.4 (*)    AST 11 (*)    ALT 10 (*)    Anion gap 16 (*)    All other components within normal limits  CBC WITH DIFFERENTIAL/PLATELET - Abnormal; Notable for the following components:   WBC 15.3 (*)    Hemoglobin 10.3 (*)    HCT 32.3 (*)    MCH 25.8 (*)    RDW 19.2 (*)    Platelets 641 (*)    Neutro Abs 12.3 (*)    Monocytes Absolute 1.2 (*)    All other components within normal limits  URINALYSIS, ROUTINE W REFLEX MICROSCOPIC - Abnormal; Notable for the following components:   APPearance TURBID (*)    Hgb urine dipstick LARGE (*)    Ketones, ur 5 (*)    Protein, ur >=300 (*)    Leukocytes, UA MODERATE (*)    All other components within normal limits  MAGNESIUM - Abnormal; Notable for the following components:   Magnesium 1.5 (*)    All other components within normal limits  URINALYSIS, MICROSCOPIC (REFLEX) - Abnormal; Notable for the following components:   Bacteria, UA MANY (*)    All other components within normal limits  CULTURE, BLOOD (ROUTINE X 2)  CULTURE, BLOOD (ROUTINE X 2)  URINE CULTURE  I-STAT CG4 LACTIC ACID, ED  I-STAT TROPONIN, ED    EKG EKG Interpretation  Date/Time:  Thursday October 09 2017 08:47:57 EDT Ventricular Rate:  117 PR Interval:    QRS Duration: 82 QT Interval:  355 QTC Calculation: 496 R Axis:   -27 Text Interpretation:  Sinus tachycardia Borderline left axis deviation Anterior infarct, old Nonspecific repol abnormality, lateral leads Confirmed by Sherwood Gambler [phone]) on 10/09/2017 8:52:18 AM   Radiology Dg Chest 2 View  Result Date: 10/09/2017 CLINICAL DATA:  Chest pain.  Recent cough and fever. EXAM: CHEST - 2 VIEW COMPARISON:  Chest x-ray 08/13/2017. FINDINGS: Mediastinum hilar structures  normal. Heart size stable. Low lung volumes with mild basilar atelectasis. No pleural effusion or pneumothorax. Thoracic spine degenerative changes and scoliosis. IMPRESSION: No acute cardiopulmonary disease. Electronically Signed   By: Marcello Moores  Register   On: 10/09/2017 11:06    Procedures .Critical Care Performed by: Sherwood Gambler, MD Authorized by: Sherwood Gambler, MD   Critical care provider statement:    Critical care time (minutes):  30   Critical care time was exclusive of:  Separately billable procedures and treating other patients   Critical care was necessary to treat or prevent imminent or life-threatening deterioration of the following conditions:  Endocrine crisis and sepsis   Critical care was time spent personally by me on the following activities:  Development of treatment plan with patient or surrogate, discussions with consultants, evaluation of patient's response to treatment, examination of patient, obtaining history from patient or surrogate, ordering and performing treatments and interventions, ordering and review of laboratory studies, ordering and review of radiographic studies, pulse oximetry, re-evaluation of patient's condition and review of old charts   (including critical care time)  Medications Ordered in ED Medications  potassium chloride 10 mEq in 100 mL IVPB (10 mEq Intravenous New Bag/Given 10/09/17 1517)  ceFEPIme (MAXIPIME) 1 g in sodium chloride 0.9 % 100 mL IVPB (has no administration in time range)  vancomycin (VANCOCIN) IVPB 750 mg/150 ml premix (has no administration in time range)  ceFEPIme (MAXIPIME) 2 g in sodium chloride 0.9 % 100 mL IVPB (0 g Intravenous Stopped 10/09/17 1027)  vancomycin (VANCOCIN) IVPB 1000 mg/200 mL premix (0 mg Intravenous Stopped 10/09/17 1125)  sodium chloride 0.9 % bolus 1,000 mL (0 mLs Intravenous Stopped 10/09/17 1115)  acetaminophen (TYLENOL) tablet 650 mg (650 mg Oral Given 10/09/17 0958)  potassium chloride SA  (K-DUR,KLOR-CON) CR tablet 40 mEq (40 mEq Oral Given 10/09/17 1125)     Initial Impression / Assessment and Plan / ED Course  I have reviewed the triage vital signs and the nursing notes.  Pertinent labs & imaging results that were available during my care of the patient were reviewed by me and considered in my medical decision making (see chart for details).     Patient found to have low-grade fever.  She has had cough and fever at her facility and so she was given broad antibiotics for hospital-acquired pneumonia.  Her chest x-ray appears clear but she does appear to have a UTI.  This is a recurrent issue for due to her suprapubic catheter.  The cefepime should cover this.  She was also found to have severe hypokalemia less than 2.  This would likely explain her nonspecific ST/T changes and these are probably more likely U waves.  Magnesium mildly low.  She was given oral and IV potassium replacement in addition to broad-spectrum antibiotics.  She would need to be admitted to the internal medicine teaching service.  Final Clinical Impressions(s) / ED Diagnoses   Final diagnoses:  Hypokalemia  Acute UTI    ED Discharge Orders    None       Sherwood Gambler, MD 10/09/17 [phone]

## 2017-10-09 NOTE — ED Notes (Signed)
Pt eating dinner

## 2017-10-09 NOTE — ED Notes (Signed)
Plan is for antibiotics following finishing Potassium boluses

## 2017-10-10 ENCOUNTER — Other Ambulatory Visit: Payer: Self-pay

## 2017-10-10 DIAGNOSIS — E119 Type 2 diabetes mellitus without complications: Secondary | ICD-10-CM

## 2017-10-10 DIAGNOSIS — L89610 Pressure ulcer of right heel, unstageable: Secondary | ICD-10-CM | POA: Diagnosis present

## 2017-10-10 DIAGNOSIS — Z960 Presence of urogenital implants: Secondary | ICD-10-CM | POA: Diagnosis not present

## 2017-10-10 DIAGNOSIS — L89322 Pressure ulcer of left buttock, stage 2: Secondary | ICD-10-CM | POA: Diagnosis present

## 2017-10-10 DIAGNOSIS — K592 Neurogenic bowel, not elsewhere classified: Secondary | ICD-10-CM | POA: Diagnosis present

## 2017-10-10 DIAGNOSIS — Z794 Long term (current) use of insulin: Secondary | ICD-10-CM | POA: Diagnosis not present

## 2017-10-10 DIAGNOSIS — E876 Hypokalemia: Secondary | ICD-10-CM

## 2017-10-10 DIAGNOSIS — Z87440 Personal history of urinary (tract) infections: Secondary | ICD-10-CM | POA: Diagnosis not present

## 2017-10-10 DIAGNOSIS — L089 Local infection of the skin and subcutaneous tissue, unspecified: Secondary | ICD-10-CM | POA: Diagnosis not present

## 2017-10-10 DIAGNOSIS — R319 Hematuria, unspecified: Secondary | ICD-10-CM

## 2017-10-10 DIAGNOSIS — Z79899 Other long term (current) drug therapy: Secondary | ICD-10-CM | POA: Diagnosis not present

## 2017-10-10 DIAGNOSIS — T83030A Leakage of cystostomy catheter, initial encounter: Secondary | ICD-10-CM | POA: Diagnosis present

## 2017-10-10 DIAGNOSIS — G35 Multiple sclerosis: Secondary | ICD-10-CM | POA: Diagnosis present

## 2017-10-10 DIAGNOSIS — L89310 Pressure ulcer of right buttock, unstageable: Secondary | ICD-10-CM | POA: Diagnosis present

## 2017-10-10 DIAGNOSIS — R079 Chest pain, unspecified: Secondary | ICD-10-CM | POA: Diagnosis not present

## 2017-10-10 DIAGNOSIS — E1140 Type 2 diabetes mellitus with diabetic neuropathy, unspecified: Secondary | ICD-10-CM

## 2017-10-10 DIAGNOSIS — Z96653 Presence of artificial knee joint, bilateral: Secondary | ICD-10-CM | POA: Diagnosis present

## 2017-10-10 DIAGNOSIS — Z888 Allergy status to other drugs, medicaments and biological substances status: Secondary | ICD-10-CM | POA: Diagnosis not present

## 2017-10-10 DIAGNOSIS — Y848 Other medical procedures as the cause of abnormal reaction of the patient, or of later complication, without mention of misadventure at the time of the procedure: Secondary | ICD-10-CM | POA: Diagnosis present

## 2017-10-10 DIAGNOSIS — D638 Anemia in other chronic diseases classified elsewhere: Secondary | ICD-10-CM

## 2017-10-10 DIAGNOSIS — T83511A Infection and inflammatory reaction due to indwelling urethral catheter, initial encounter: Secondary | ICD-10-CM | POA: Diagnosis not present

## 2017-10-10 DIAGNOSIS — G8389 Other specified paralytic syndromes: Secondary | ICD-10-CM | POA: Diagnosis not present

## 2017-10-10 DIAGNOSIS — Z885 Allergy status to narcotic agent status: Secondary | ICD-10-CM | POA: Diagnosis not present

## 2017-10-10 DIAGNOSIS — Z7952 Long term (current) use of systemic steroids: Secondary | ICD-10-CM | POA: Diagnosis not present

## 2017-10-10 DIAGNOSIS — E878 Other disorders of electrolyte and fluid balance, not elsewhere classified: Secondary | ICD-10-CM | POA: Diagnosis not present

## 2017-10-10 DIAGNOSIS — D649 Anemia, unspecified: Secondary | ICD-10-CM | POA: Diagnosis not present

## 2017-10-10 DIAGNOSIS — Z820 Family history of epilepsy and other diseases of the nervous system: Secondary | ICD-10-CM | POA: Diagnosis not present

## 2017-10-10 DIAGNOSIS — N319 Neuromuscular dysfunction of bladder, unspecified: Secondary | ICD-10-CM | POA: Diagnosis present

## 2017-10-10 DIAGNOSIS — N390 Urinary tract infection, site not specified: Secondary | ICD-10-CM

## 2017-10-10 DIAGNOSIS — R0789 Other chest pain: Secondary | ICD-10-CM | POA: Diagnosis not present

## 2017-10-10 DIAGNOSIS — T83510A Infection and inflammatory reaction due to cystostomy catheter, initial encounter: Secondary | ICD-10-CM | POA: Diagnosis present

## 2017-10-10 DIAGNOSIS — G8220 Paraplegia, unspecified: Secondary | ICD-10-CM | POA: Diagnosis present

## 2017-10-10 DIAGNOSIS — I10 Essential (primary) hypertension: Secondary | ICD-10-CM | POA: Diagnosis present

## 2017-10-10 DIAGNOSIS — L89152 Pressure ulcer of sacral region, stage 2: Secondary | ICD-10-CM | POA: Diagnosis present

## 2017-10-10 DIAGNOSIS — R000 Tachycardia, unspecified: Secondary | ICD-10-CM | POA: Diagnosis present

## 2017-10-10 DIAGNOSIS — Z833 Family history of diabetes mellitus: Secondary | ICD-10-CM | POA: Diagnosis not present

## 2017-10-10 LAB — BASIC METABOLIC PANEL
ANION GAP: 16 — AB (ref 5–15)
Anion gap: 13 (ref 5–15)
BUN: 6 mg/dL (ref 6–20)
CO2: 32 mmol/L (ref 22–32)
CO2: 32 mmol/L (ref 22–32)
Calcium: 8.5 mg/dL — ABNORMAL LOW (ref 8.9–10.3)
Calcium: 9 mg/dL (ref 8.9–10.3)
Chloride: 94 mmol/L — ABNORMAL LOW (ref 101–111)
Chloride: 94 mmol/L — ABNORMAL LOW (ref 101–111)
Creatinine, Ser: 0.47 mg/dL (ref 0.44–1.00)
Creatinine, Ser: 0.57 mg/dL (ref 0.44–1.00)
GFR calc Af Amer: >60 mL/min (ref >60)
GLUCOSE: 107 mg/dL — AB (ref 65–99)
Glucose, Bld: 169 mg/dL — ABNORMAL HIGH (ref 65–99)
POTASSIUM: 2.6 mmol/L — AB (ref 3.5–5.1)
POTASSIUM: 3.1 mmol/L — AB (ref 3.5–5.1)
SODIUM: 139 mmol/L (ref 135–145)
SODIUM: 142 mmol/L (ref 135–145)

## 2017-10-10 LAB — RENAL FUNCTION PANEL
ALBUMIN: 2.2 g/dL — AB (ref 3.5–5.0)
Anion gap: 11 (ref 5–15)
BUN: 11 mg/dL (ref 6–20)
CALCIUM: 8.8 mg/dL — AB (ref 8.9–10.3)
CO2: 33 mmol/L — ABNORMAL HIGH (ref 22–32)
Chloride: 93 mmol/L — ABNORMAL LOW (ref 101–111)
Creatinine, Ser: 0.57 mg/dL (ref 0.44–1.00)
GFR calc Af Amer: >60 mL/min (ref >60)
GFR calc non Af Amer: >60 mL/min (ref >60)
Glucose, Bld: 236 mg/dL — ABNORMAL HIGH (ref 65–99)
PHOSPHORUS: 1.2 mg/dL — AB (ref 2.5–4.6)
Potassium: 4.3 mmol/L (ref 3.5–5.1)
SODIUM: 137 mmol/L (ref 135–145)

## 2017-10-10 LAB — URINE CULTURE

## 2017-10-10 LAB — CBC
HEMATOCRIT: 32.9 % — AB (ref 36.0–46.0)
HEMOGLOBIN: 10.3 g/dL — AB (ref 12.0–15.0)
MCH: 25.9 pg — ABNORMAL LOW (ref 26.0–34.0)
MCHC: 31.3 g/dL (ref 30.0–36.0)
MCV: 82.7 fL (ref 78.0–100.0)
Platelets: 662 10*3/uL — ABNORMAL HIGH (ref 150–400)
RBC: 3.98 MIL/uL (ref 3.87–5.11)
RDW: 19.7 % — AB (ref 11.5–15.5)
WBC: 13.2 10*3/uL — AB (ref 4.0–10.5)

## 2017-10-10 LAB — MAGNESIUM: Magnesium: 1.8 mg/dL (ref 1.7–2.4)

## 2017-10-10 LAB — MRSA PCR SCREENING: MRSA BY PCR: NEGATIVE

## 2017-10-10 LAB — GLUCOSE, CAPILLARY
GLUCOSE-CAPILLARY: 133 mg/dL — AB (ref 65–99)
GLUCOSE-CAPILLARY: 260 mg/dL — AB (ref 65–99)
Glucose-Capillary: 196 mg/dL — ABNORMAL HIGH (ref 65–99)
Glucose-Capillary: 223 mg/dL — ABNORMAL HIGH (ref 65–99)

## 2017-10-10 MED ORDER — INSULIN ASPART 100 UNIT/ML ~~LOC~~ SOLN
2.0000 [IU] | Freq: Three times a day (TID) | SUBCUTANEOUS | Status: DC
  Administered 2017-10-10 – 2017-10-14 (×9): 2 [IU] via SUBCUTANEOUS

## 2017-10-10 MED ORDER — PROSIGHT PO TABS
1.0000 | ORAL_TABLET | Freq: Every day | ORAL | Status: DC
  Administered 2017-10-11 – 2017-10-14 (×4): 1 via ORAL
  Filled 2017-10-10 (×4): qty 1

## 2017-10-10 MED ORDER — POTASSIUM CHLORIDE CRYS ER 20 MEQ PO TBCR
40.0000 meq | EXTENDED_RELEASE_TABLET | ORAL | Status: AC
  Administered 2017-10-10 (×2): 40 meq via ORAL
  Filled 2017-10-10 (×2): qty 2

## 2017-10-10 MED ORDER — PRO-STAT SUGAR FREE PO LIQD
30.0000 mL | Freq: Three times a day (TID) | ORAL | Status: DC
  Administered 2017-10-10 – 2017-10-14 (×12): 30 mL via ORAL
  Filled 2017-10-10 (×14): qty 30

## 2017-10-10 MED ORDER — JUVEN PO PACK
1.0000 | PACK | Freq: Two times a day (BID) | ORAL | Status: DC
  Administered 2017-10-10 – 2017-10-12 (×3): 1 via ORAL
  Filled 2017-10-10 (×10): qty 1

## 2017-10-10 MED ORDER — POTASSIUM CHLORIDE CRYS ER 20 MEQ PO TBCR: 40.0000 meq | EXTENDED_RELEASE_TABLET | Freq: Four times a day (QID) | ORAL | Status: DC

## 2017-10-10 NOTE — Discharge Summary (Signed)
Name: Laura Roberts MRN: [id number] DOB: 12-Jul-1940 77 y.o. PCP: [name], MD  Date of Admission: 10/09/2017  8:39 AM Date of Discharge: 10/14/17 Attending Physician: [name], MD  Discharge Diagnosis: 1.  Active Problems:   UTI (urinary tract infection)   Complicated UTI (urinary tract infection)   Hypokalemia   Diabetes mellitus type 2, insulin dependent (HCC)   Hypomagnesemia   Discharge Medications: Allergies as of 10/14/2017      Reactions   Codeine Other (See Comments)   Difficult breathing and skin problem   Ultram [tramadol] Other (See Comments)   Difficult breathing and skin peeling   Januvia [sitagliptin] Rash   Blisters      Medication List    TAKE these medications   acetaminophen 325 MG tablet Commonly known as:  TYLENOL Take 650 mg by mouth 3 (three) times daily.   amLODipine 10 MG tablet Commonly known as:  NORVASC Take 10 mg by mouth at bedtime.   ARTIFICIAL TEAR OP Apply 1 drop to eye 4 (four) times daily as needed (dry eyes).   ASPERCREME LIDOCAINE 4 % Ptch Generic drug:  Lidocaine Apply 1 patch topically daily as needed (To painful areas).   baclofen 10 MG tablet Commonly known as:  LIORESAL 1.5 tablet in the morning, 1 tablet midday, two tablets at night What changed:    how much to take  how to take this  when to take this  additional instructions   calcium citrate 950 MG tablet Commonly known as:  CALCITRATE - dosed in mg elemental calcium Take 200 mg of elemental calcium by mouth daily.   cefdinir 300 MG capsule Commonly known as:  OMNICEF Take 1 capsule (300 mg total) by mouth 2 (two) times daily for 4 days. Start taking on:  10/15/2017   cholecalciferol 1000 units tablet Commonly known as:  VITAMIN D Take 1,000 Units by mouth daily.   Dimethyl Fumarate 240 MG Cpdr Commonly known as:  TECFIDERA Take 1 capsule (240 mg total) by mouth 2 (two) times daily.   DULoxetine 30 MG capsule Commonly known  as:  CYMBALTA Take 90 mg by mouth daily.   eucerin cream Apply 1 application topically 2 (two) times daily.   feeding supplement (PRO-STAT SUGAR FREE 64) Liqd Take 30 mLs by mouth 2 (two) times daily.   gabapentin 300 MG capsule Commonly known as:  NEURONTIN Take 1 capsule (300 mg total) by mouth 2 (two) times daily.   insulin glargine 100 UNIT/ML injection Commonly known as:  LANTUS Inject 0.2 mLs (20 Units total) into the skin at bedtime. What changed:  how much to take   omeprazole 20 MG capsule Commonly known as:  PRILOSEC Take 20 mg by mouth daily.   polyethylene glycol packet Commonly known as:  MIRALAX / GLYCOLAX Take 17 g by mouth daily as needed for mild constipation.   predniSONE 5 MG tablet Commonly known as:  DELTASONE 1 tablet on odd days, one half tablet on even days What changed:    how much to take  how to take this  when to take this  additional instructions   senna-docusate 8.6-50 MG tablet Commonly known as:  Senokot-S Take 2 tablets by mouth at bedtime.   SENSI-CARE PROTECTIVE BARRIER 49-15 % Oint Generic drug:  Petrolatum-Zinc Oxide Apply 1 application topically 2 (two) times daily. Apply to buttocks and inner thighs   tolterodine 4 MG 24 hr capsule Commonly known as:  DETROL LA Take 4 mg by  mouth daily.       Disposition and follow-up:   Ms.Laura Roberts was discharged from [facility] in Stable condition.  At the hospital follow up visit please address:  Suprapubic catheter infection/UTI  -continue omnicef for four days to complete course -will need follow up appointment with urology -check urinalysis to ensure resolution of hematuria  Sinus tachycardia  -exacerbated by infection -on chart review seems to be chronic to some degree, would work up further if does not resolve after infection  Electrolyte abnormalities  -continue to monitor and replete as necessary pt had low mag, low k, on  admission    2.  Labs / imaging needed at time of follow-up: cbc, bmp, mag, phos,  urinalysis  3.  Pending labs/ test needing follow-up: none  Follow-up Appointments: Follow-up Information    [name], MD Follow up in 1 week(s).   Specialty:  Family Medicine Why:  Please follow up with PCP within one week Contact information: [address] [place] [address] [phone]        [name], MD Follow up in 1 week(s).   Specialty:  Urology Why:  Please follow up with your urologist in one weeks Contact information: [place] [id number] [place] [facility] Course by problem list: Active Problems:   UTI (urinary tract infection)   Complicated UTI (urinary tract infection)   Hypokalemia   Diabetes mellitus type 2, insulin dependent ([facility])   Hypomagnesemia   Patient arrived to the emergency department with reported fevers at her facility, low-grade fever by rectal temp here.  She was uncomfortable and extreme pain in her suprapubic area she had a recent suprapubic catheter placement for her neurogenic bladder.  On further examination of the bladder catheter there was a pus exudate coming from the area of the catheter site.  It had a foul odor.  The patient was found to have a white count she arrived with a severely low potassium and a low magnesium.  She had a UA that was suggestive of urinary tract infection.  She was started on IV ceftriaxone with improvement in symptoms.  Her urologist was consulted.  The urology office consulted with our interventional radiologist who decided to remove the suprapubic catheter and increase the size of the catheter which was scheduled to take place later that month.  Her symptoms improved with antibiotic therapy, her elect electrolytes were repleted, she remained afebrile her white blood cell count improved she was discharged back to her facility to finish her course of antibiotics as she was  medically stable.  Discharge Vitals:   BP 119/70 (BP Location: Right Arm)   Pulse (!) 128   Temp 99.5 F (37.5 C) (Oral)   Resp 20   Ht 5\' 2"  (1.575 m)   Wt 183 lb 3.2 oz (83.1 kg)   SpO2 92%   BMI 33.51 kg/m   Pertinent Labs, Studies, and Procedures:  BMP Latest Ref Rng & Units 10/14/2017 10/13/2017 10/12/2017  Glucose 65 - 99 mg/dL 137(H) 132(H) 149(H)  BUN 6 - 20 mg/dL 9 10 12   Creatinine 0.44 - 1.00 mg/dL 0.57 0.54 0.50  BUN/Creat Ratio 12 - 28 - - -  Sodium 135 - 145 mmol/L 136 137 136  Potassium 3.5 - 5.1 mmol/L 3.5 3.3(L) 3.8  Chloride 101 - 111 mmol/L 96(L) 93(L) 93(L)  CO2 22 - 32 mmol/L 27 32 30  Calcium 8.9 - 10.3 mg/dL 8.9 9.1 9.1   CBC Latest Ref Rng & Units 10/14/2017 10/13/2017 10/12/2017  WBC 4.0 - 10.5 K/uL 15.2(H) 15.1(H) 18.3(H)  Hemoglobin 12.0 - 15.0 g/dL 10.0(L) 9.8(L) 10.4(L)  Hematocrit 36.0 - 46.0 % 31.2(L) 30.9(L) 32.5(L)  Platelets 150 - 400 K/uL 605(H) 662(H) 685(H)   INDICATION: History of prostate cancer, post ultrasound fluoroscopic guided suprapubic catheter placement on 03/21/2015.   Patient has subsequent undergone multiple fluoroscopic guided suprapubic catheter exchanges and up sizing.   Patient returns today for routine exchange.   History of advanced multiple sclerosis with non functionality of the lower extremities and chronic incontinence post ultrasound CT-guided placement of a suprapubic catheter on 09/09/2017   Patient presents today for fluoroscopic guided exchange and up sizing.   EXAM: FLUOROSCOPIC GUIDED SUPRAPUBIC CATHETER EXCHANGE AND CONVERSION   COMPARISON:  CT and ultrasound guided suprapubic catheter placement - 09/09/2017   MEDICATIONS: None   ANESTHESIA/SEDATION: Moderate (conscious) sedation was employed during this procedure. A total of Versed 1 mg and Fentanyl 25 mcg was administered intravenously.   Moderate Sedation Time: 10 minutes. The patient's level of consciousness and vital signs were monitored  continuously by radiology nursing throughout the procedure under my direct supervision.   CONTRAST:  None   FLUOROSCOPY TIME:  24 seconds (1 mGy)   COMPLICATIONS: None immediate.   PROCEDURE: The procedure, risks, benefits, and alternatives were explained to the patient. Questions regarding the procedure were encouraged and answered. The patient understands and consents to the procedure. A timeout was performed prior to the initiation of the procedure.   The external portion of the existing suprapubic catheter as well as the surrounding skin were prepped and draped in usual sterile fashion   A preprocedural spot fluoroscopic image was obtained of the existing 12 [place] all-purpose drainage catheter with and coiled and locked overlying expected location of the urinary bladder.   Contrast injection confirmed appropriate positioning and functionality existing suprapubic catheter.   The external portion of the existing suprapubic catheter was cut and cannulated with a short Amplatz wire which was coiled within the urinary bladder.   Under intermittent fluoroscopic guidance, the existing 12 [place] all-purpose drainage catheter was exchanged for a new 32 French catheter with end coiled and locked within the urinary bladder.   Appropriate position was confirmed with the injection of a small amount of contrast as well as a efflux of urine.   The catheter was connected to a gravity bag. A dressing was placed. The patient tolerated the procedure well without immediate postprocedural complication.   FINDINGS: Preprocedural imaging demonstrates unchanged positioning of all-purpose drainage catheter with end coiled and locked over the expected location the urinary bladder.   Contrast injection confirms appropriate positioning and functionality of the existing percutaneous drainage catheter.   After fluoroscopic guided exchange, a new, slightly larger now 50 French catheter is  appropriately positioned within the urinary bladder.   IMPRESSION: Technically successful fluoroscopic guided exchange and up sizing of now 14 French suprapubic catheter.   PLAN: Would consider continued up sizing of percutaneous gastrostomy tube on future routine exchanges as clinically indicated.     Electronically Signed   By: Sandi Mariscal M.D.   On: 10/13/2017 17:03     Discharge Instructions: Discharge Instructions    Call MD for:  difficulty breathing, headache or visual disturbances   Complete by:  As directed    Call MD for:  extreme fatigue   Complete by:  As directed  Call MD for:  hives   Complete by:  As directed    Call MD for:  persistant dizziness or light-headedness   Complete by:  As directed    Call MD for:  persistant nausea and vomiting   Complete by:  As directed    Call MD for:  redness, tenderness, or signs of infection (pain, swelling, redness, odor or green/yellow discharge around incision site)   Complete by:  As directed    Call MD for:  severe uncontrolled pain   Complete by:  As directed    Call MD for:  temperature >100.4   Complete by:  As directed    Diet - low sodium heart healthy   Complete by:  As directed    Discharge instructions   Complete by:  As directed    Please take care to keep the suprapubic catheter site clean and dry.  Finish your course of antibiotics.  It will be important to follow up with your urologist as your paperwork suggests.  Please see discharge summary for more thorough recommendations.   Increase activity slowly   Complete by:  As directed       Signed: Katherine Roan, MD 10/14/2017, 4:32 PM

## 2017-10-10 NOTE — Progress Notes (Signed)
New Admission Note:   Arrival Method: Arrived from [facility] ED via stretcher Mental Orientation: Alert and oriented x4 Telemetry: Box #15 Assessment: Completed Skin: See doc flowsheet IV: Lt FA-NSL Pain: Denies Tubes: Suprapubic catheter Safety Measures: Safety Fall Prevention Plan has been discussed. Admission: Completed 55M Orientation: Patient has been orientated to the room, unit and staff.  Family: None at bedside  Orders have been reviewed and implemented. Will continue to monitor the patient. Call light has been placed within reach and bed alarm has been activated.   Terel Bann [employer], RN-BC Phone number: [phone]

## 2017-10-10 NOTE — Consult Note (Signed)
[facility] Nurse wound consult note Reason for Consult: Suprapubic urinary catheter  The patient does not have any wound or skin issue around the suprapubic urinary catheter insertion site.  She states the catheter has always leaked a little, but now large volumes of urine are leaking out.  The urine in the existing pouching system is yellow and turbid.  She further states she has had the catheter since February, it has not been changed, but she was due to return to the physician that placed it next week.  She also states she has "MS" and her muscles on her left side contract and they take a while to release.  Today I was able to palpate around the tube and no pain or discomfort was solicited, except when I got to the area to her left of the tube.  She stated she could feel that when I touched. Recommendations: 1.  Cleanse area with soap and water every shift and apply [employer] Clear to the peritube area. 2.  I recommend considering sending her for Suprapubic tube replacement.  I question if this tube is partially occluded with sediment I see in the urine. 3.  Ensure that a catheter stabilization device is in place and used to prevent tube tract erosion and enlargement.  One is currently in use. Thank you for the consult.  Discussed plan of care with the patient and bedside nurse.  [facility] nurse will not follow at this time.  Please re-consult the [facility] team if needed.  Val Riles, RN, MSN, CWOCN, CNS-BC, pager [phone]

## 2017-10-10 NOTE — Progress Notes (Addendum)
Subjective: Pt with improvement in pain today but still very tender.  Still depressed about clinical decline but mood improved from yesterday.  Does not endorse shortness of breath, n/v  Objective:  Vital signs in last 24 hours: Vitals:   10/09/17 1945 10/09/17 2000 10/09/17 2120 10/10/17 0516  BP:  132/70 136/76 101/61  Pulse: (!) 32  (!) 118 (!) 112  Resp:   19 19  Temp:   99.9 F (37.7 C) 98.1 F (36.7 C)  TempSrc:   Oral Oral  SpO2: 90%  96% 92%  Weight:   185 lb 13.6 oz (84.3 kg)   Height:       Physical Exam  Constitutional: She is oriented to person, place, and time. No distress.  HENT:  Head: Normocephalic and atraumatic.  Neck: No thyromegaly present.  Cardiovascular: Regular rhythm and normal heart sounds. Tachycardia present. Exam reveals no gallop and no friction rub.  No murmur heard. Pulmonary/Chest: Effort normal and breath sounds normal. No respiratory distress. She has no wheezes. She has no rales. She exhibits no tenderness.  Abdominal: Soft. Bowel sounds are normal. She exhibits no distension and no mass. There is tenderness (extreme suprapubic tenderness on palpation, the bandage around catheter is removed, there is no longer dried blood but dried pus is present on washcloth). There is no rebound and no guarding.  Musculoskeletal:       Right lower leg: She exhibits edema (trace in leg 1-2+ in feet).       Left lower leg: She exhibits edema (trace in leg 1-2+ in feet).  Neurological: She is alert and oriented to person, place, and time.  Skin: She is not diaphoretic.  Psychiatric: She exhibits a depressed mood.    Assessment/Plan:  Active Problems:   UTI (urinary tract infection)   Complicated UTI (urinary tract infection)  UTI with possible Suprapubic Catheter site infection: UA and pts symptoms concerning for a recurrent UTI and catheter site infection.  Pus seen on exam today, still exquisitely tender but some improvement with redness and irritation  after abx. Large pannus overlying catheter site.     -urine culture shows too numerous organisms to speciate, blood culture pending -narrowed to ceftriaxone, will continue as clinical response reassuring (pts tachycardia is chronic) -wound care consulted appreciate assistance -contacted patient's urologist Dr. Gloriann Loan to remove suprapubic catheter as site is infected   Chest Pain: likely non cardiac, not consistent with anginal symptoms, resolved long before our encounter with the patient. Troponins neg, ecg not consistent with ACS.  Symptoms more consistent with GERD   -repeat ecg this morning still not collected -continuous cardiac monitoring -continue pantoprazole -replacing potassium   Hypokalemia: severe less than 2.0 with ecg changes was started on IV potassium runs in ED    -continuous cardiac monitoring -repeat ecg in am still pending -follow up bmets and continue to replete -replaced magnesium as well   T2DM: on lantus 25 units daily at her facility   -will begin with lantus 15 units as pt with poor appetite -SSI thin -appetite increased today will add low dose mealtime 2 units   MS: followed by [facility], disease seems to be stable, major complications are paraplegia and neurogenic bowel and bladder   -continue tecfidera -low dose prednisone 5mg  every other day   Normocytic anemia: pt with anemia of chronic disease, also with hematuria. Hgb down 1g from one month ago   -will need monitoring of hematuria to ensure resolution when infection treated -CBC's  Dispo: Anticipated discharge in approximately 2-3 day(s).   Laura Roan, MD 10/10/2017, 9:20 AM Vickki Muff MD PGY-1 Internal Medicine Pager # [phone]

## 2017-10-10 NOTE — Progress Notes (Signed)
Initial Nutrition Assessment  DOCUMENTATION CODES:   Obesity unspecified  INTERVENTION:   Increase Pro-Stat to 30 mL TID (each packet provides 100 kcals, 15 g of protein)  Add Juven BID, each packet provides 80 calories, 8 grams of carbohydrate, 2.5  grams of protein (collagen), 7 grams of L-arginine and 7 grams of L-glutamine; supplement contains CaHMB, Vitamins C, E, B12 and Zinc to promote wound healing  MVI daily  Pt should continue using Juven and Pro-Stat supplements at discharge  NUTRITION DIAGNOSIS:   Increased nutrient needs related to wound healing as evidenced by estimated needs.  GOAL:   Patient will meet greater than or equal to 90% of their needs  MONITOR:   PO intake, Supplement acceptance, Skin, Labs, Weight trends  REASON FOR ASSESSMENT:   Malnutrition Screening Tool    ASSESSMENT:   77 yo female admitted with hx of MS with associated paraplegia and neurogenic bladder with suprapubic catheter insertion 1 month ago, DM. Pt admitted with recurrent UTI and possible urinary catheter infection  Recorded po intake 75% at breakfast this AM, 100% at lunch. Pt with poor appetite prior to admission as not feeling well. Pt receives Pro-Stat supplement BID as outpatient.  Reviewed weight history and no significant weight loss noted.   Wt Readings from Last 10 Encounters:  10/09/17 185 lb 13.6 oz (84.3 kg)  09/09/17 203 lb (92.1 kg)  09/09/17 193 lb (87.5 kg)  08/13/17 190 lb 4.1 oz (86.3 kg)  03/06/17 193 lb 12.8 oz (87.9 kg)  11/22/16 180 lb 4.8 oz (81.8 kg)  08/28/16 178 lb (80.7 kg)  06/03/16 176 lb (79.8 kg)  11/29/15 176 lb (79.8 kg)  05/30/15 172 lb (78 kg)   Labs: potassium 3.1 (L-supplemented), magnesium wdl Meds: prednisone, KCl, mag sulfate, Vit D, ss novolog, lantus, calcium citrate  NUTRITION - FOCUSED PHYSICAL EXAM:  Unable to assess on visit today  Diet Order:  Diet Carb Modified Fluid consistency: Thin; Room service appropriate?  Yes  EDUCATION NEEDS:   Education needs have been addressed  Skin:  Skin Assessment: Skin Integrity Issues: Skin Integrity Issues:: Stage II, Unstageable Stage II: sacrum, buttocks Unstageable: buttocks, heel  Last BM:  4/11  Height:   Ht Readings from Last 1 Encounters:  10/09/17 5\' 2"  (1.575 m)    Weight:   Wt Readings from Last 1 Encounters:  10/09/17 185 lb 13.6 oz (84.3 kg)    Ideal Body Weight:  48 kg  BMI:  Body mass index is 33.99 kg/m.  Estimated Nutritional Needs:   Kcal:  5465-0354 kcals   Protein:  90-110 g  Fluid:  >/= 1.9 L   Kerman Passey MS, RD, LDN, CNSC [phone] Pager  [phone] Weekend/On-Call Pager

## 2017-10-10 NOTE — Care Management Note (Addendum)
Case Management Note  Patient Details  Name: Laura Roberts MRN: [id number] Date of Birth: Jan 14, 1941  Subjective/Objective:   History of MS, T2 DM, neurogenic bladder status post suprapubic catheter insertion 1 month ago; Admitted for  UTI with possible Suprapubic Catheter site infection  Action/Plan: PCP noted.  NCM will continue to follow for discharge transition needs.  Expected Discharge Date:                  Expected Discharge Plan:     Discharge planning Services  CM Consult  Post Acute Care Choice:    Choice offered to:     DME Arranged:    DME Agency:     HH Arranged:    HH Agency:     Status of Service:  In process, will continue to follow  Kristen Cardinal, RN  Nurse case [place] 10/10/2017, 12:07 PM

## 2017-10-11 DIAGNOSIS — K592 Neurogenic bowel, not elsewhere classified: Secondary | ICD-10-CM

## 2017-10-11 DIAGNOSIS — G8220 Paraplegia, unspecified: Secondary | ICD-10-CM

## 2017-10-11 DIAGNOSIS — R079 Chest pain, unspecified: Secondary | ICD-10-CM

## 2017-10-11 LAB — BASIC METABOLIC PANEL
Anion gap: 10 (ref 5–15)
BUN: 7 mg/dL (ref 6–20)
CALCIUM: 8.8 mg/dL — AB (ref 8.9–10.3)
CO2: 33 mmol/L — AB (ref 22–32)
Chloride: 92 mmol/L — ABNORMAL LOW (ref 101–111)
Creatinine, Ser: 0.46 mg/dL (ref 0.44–1.00)
GFR calc Af Amer: >60 mL/min (ref >60)
GLUCOSE: 142 mg/dL — AB (ref 65–99)
POTASSIUM: 3.6 mmol/L (ref 3.5–5.1)
Sodium: 135 mmol/L (ref 135–145)

## 2017-10-11 LAB — GLUCOSE, CAPILLARY
GLUCOSE-CAPILLARY: 194 mg/dL — AB (ref 65–99)
GLUCOSE-CAPILLARY: 155 mg/dL — AB (ref 65–99)
Glucose-Capillary: 151 mg/dL — ABNORMAL HIGH (ref 65–99)
Glucose-Capillary: 164 mg/dL — ABNORMAL HIGH (ref 65–99)

## 2017-10-11 LAB — CBC
HCT: 31.5 % — ABNORMAL LOW (ref 36.0–46.0)
Hemoglobin: 10.1 g/dL — ABNORMAL LOW (ref 12.0–15.0)
MCH: 25.8 pg — AB (ref 26.0–34.0)
MCHC: 32.1 g/dL (ref 30.0–36.0)
MCV: 80.4 fL (ref 78.0–100.0)
PLATELETS: 603 10*3/uL — AB (ref 150–400)
RBC: 3.92 MIL/uL (ref 3.87–5.11)
RDW: 19.3 % — AB (ref 11.5–15.5)
WBC: 12.9 10*3/uL — ABNORMAL HIGH (ref 4.0–10.5)

## 2017-10-11 MED ORDER — SODIUM CHLORIDE 0.9% FLUSH: 3.0000 mL | INTRAVENOUS | Status: DC | PRN

## 2017-10-11 MED ORDER — SODIUM CHLORIDE 0.9% FLUSH
3.0000 mL | Freq: Two times a day (BID) | INTRAVENOUS | Status: DC
  Administered 2017-10-11 – 2017-10-14 (×6): 3 mL via INTRAVENOUS

## 2017-10-11 NOTE — Progress Notes (Signed)
Called MD re:  potential change out of suprapubic catheter.  Site has pustular drainage and is malodorous.  Cath was originally inserted in February and has yet to be changed.  Nursing unable to do first change-out.  Urology to be consulted.

## 2017-10-11 NOTE — Progress Notes (Signed)
   Subjective:  Patient continues to have suprapubic pain although states pain is stable. Denies shortness of breath, n/v, fever/chills.  Objective:  Vital signs in last 24 hours: Vitals:   10/10/17 1701 10/10/17 2059 10/10/17 2117 10/11/17 0552  BP: 104/60 112/74  119/70  Pulse: (!) 110 (!) 113  (!) 114  Resp: 17     Temp: 98.5 F (36.9 C) 98.2 F (36.8 C)  98.5 F (36.9 C)  TempSrc: Oral Oral  Oral  SpO2: 97% 97%  91%  Weight:   182 lb (82.6 kg)   Height:       Physical Exam  Constitutional: She is well-developed, well-nourished, and in no distress.  Cardiovascular: Normal rate, regular rhythm and normal heart sounds. Exam reveals no gallop and no friction rub.  No murmur heard. Pulmonary/Chest: Effort normal and breath sounds normal. No respiratory distress. She has no wheezes. She has no rales.  Abdominal: Soft.  Tenderness at suprapubic region  Skin:  Minimal pustular drainage noted at suprapubic site catheter site    Assessment/Plan:  Active Problems:   UTI (urinary tract infection)   Complicated UTI (urinary tract infection)   Hypokalemia   Diabetes mellitus type 2, insulin dependent (HCC)   Hypomagnesemia  Suprapubic Catheter site infection w/ possible UTI: Still tender at site but stable.  Continued improvement with redness and irritation after abx. Large pannus overlying catheter site.  Urine culture shows too numerous organisms to speciate.  blood culture NGTD.  Will continue with ceftriaxone.  Will likely need culture of catheter site drainage.  Patient's urologist Dr. Gloriann Loan has been notified of patients status. -ceftriaxone - Suprapubic catheter will likely need to be removed -wound care  Chest Pain: likely non cardiac, not consistent with anginal symptoms, resolved before encounter with the patient for admission. Troponins neg, ecg not consistent with ACS.  Symptoms more consistent with GERD -continuous cardiac monitoring -continue pantoprazole -replacing  potassium   Electrolyte abnormalities.  On admission K was less than 2.0 with ecg changes was started on IV potassium runs in ED.  K currently 4.3.  Magnesium was 1.5 on admission and currently 1.8. -cardiac monitoring - repleat electrolytes as needed   T2DM: on lantus 25 units daily at her facility - lantus 15 units  -SSI thin - mealtime 2 units   MS: followed by [facility], disease seems to be stable, major complications are paraplegia and neurogenic bowel and bladder -continue tecfidera -low dose prednisone 5mg  every other day   Normocytic anemia: Hgb 10 and stable  -CBC's   Dispo: Anticipated discharge pending clinical improvement likely in 1-2 days.   Kalman Shan [place], DO 10/11/2017, 8:59 AM

## 2017-10-12 DIAGNOSIS — L089 Local infection of the skin and subcutaneous tissue, unspecified: Secondary | ICD-10-CM

## 2017-10-12 LAB — CBC
HEMATOCRIT: 32.5 % — AB (ref 36.0–46.0)
HEMOGLOBIN: 10.4 g/dL — AB (ref 12.0–15.0)
MCH: 25.7 pg — AB (ref 26.0–34.0)
MCHC: 32 g/dL (ref 30.0–36.0)
MCV: 80.4 fL (ref 78.0–100.0)
Platelets: 685 10*3/uL — ABNORMAL HIGH (ref 150–400)
RBC: 4.04 MIL/uL (ref 3.87–5.11)
RDW: 19.7 % — ABNORMAL HIGH (ref 11.5–15.5)
WBC: 18.3 10*3/uL — ABNORMAL HIGH (ref 4.0–10.5)

## 2017-10-12 LAB — BASIC METABOLIC PANEL
ANION GAP: 13 (ref 5–15)
BUN: 12 mg/dL (ref 6–20)
CALCIUM: 9.1 mg/dL (ref 8.9–10.3)
CHLORIDE: 93 mmol/L — AB (ref 101–111)
CO2: 30 mmol/L (ref 22–32)
Creatinine, Ser: 0.5 mg/dL (ref 0.44–1.00)
GFR calc non Af Amer: >60 mL/min (ref >60)
GLUCOSE: 149 mg/dL — AB (ref 65–99)
POTASSIUM: 3.8 mmol/L (ref 3.5–5.1)
Sodium: 136 mmol/L (ref 135–145)

## 2017-10-12 LAB — GLUCOSE, CAPILLARY
GLUCOSE-CAPILLARY: 195 mg/dL — AB (ref 65–99)
Glucose-Capillary: 91 mg/dL (ref 65–99)
Glucose-Capillary: 267 mg/dL — ABNORMAL HIGH (ref 65–99)
Glucose-Capillary: 285 mg/dL — ABNORMAL HIGH (ref 65–99)

## 2017-10-12 NOTE — Progress Notes (Signed)
   I saw and examined the patient. I reviewed the resident's note and I agree with the resident's findings and plan as documented in the resident's note.  77 year old woman admitted with fever and signs of skin and soft tissue infection associated with a suprapubic catheter that was placed about 2 months ago.  Fever has improved, leukocytosis is increased today to 18,000.  Lower abdomen is still tender and warm and edematous, no more purulence coming out of the catheter tract.  Urine culture had multiple species.  Most suspicious for skin and soft tissue infection at this point rather than urinary tract infection.  Awaiting consultation with urology to see if we need to replace/remove the suprapubic catheter or do we try to treat through this infection with antibiotics.  Axel Filler, MD 10/12/2017, 10:59 AM

## 2017-10-12 NOTE — Progress Notes (Signed)
   Subjective:  Patient continues to have suprapubic pain although states pain is stable. Denies shortness of breath, n/v, fever/chills.  Objective:  Vital signs in last 24 hours: Vitals:   10/11/17 1720 10/11/17 2034 10/12/17 0606 10/12/17 0930  BP: 121/79 (!) 107/59 126/77 109/63  Pulse: (!) 111 (!) 107 (!) 108 (!) 132  Resp: 18 18 18 18   Temp: 98 F (36.7 C) 98.2 F (36.8 C) 98.3 F (36.8 C) 98.9 F (37.2 C)  TempSrc: Oral Oral Oral Oral  SpO2: 94% 94% 100% 94%  Weight:  184 lb 1.4 oz (83.5 kg)    Height:       Physical Exam  Constitutional: She is well-developed, well-nourished, and in no distress.  Cardiovascular: Normal rate, regular rhythm and normal heart sounds. Exam reveals no gallop and no friction rub.  No murmur heard. Pulmonary/Chest: Effort normal and breath sounds normal. No respiratory distress. She has no wheezes. She has no rales.  Abdominal: Soft.  Tenderness at suprapubic region  Skin:  No pustular drainage noted at suprapubic site catheter site    Assessment/Plan:  Active Problems:   UTI (urinary tract infection)   Complicated UTI (urinary tract infection)   Hypokalemia   Diabetes mellitus type 2, insulin dependent (HCC)   Hypomagnesemia  Suprapubic Catheter site infection w/ possible UTI: Still tender at site but stable.  Continued improvement with redness and irritation after abx. Large pannus overlying catheter site.  Urine culture shows too numerous organisms to speciate.  blood culture NGTD.  Will continue with ceftriaxone.  Spoke with Dr. Junious Silk that will see patient today. -ceftriaxone -wound care   Electrolyte abnormalities.  On admission K was less than 2.0 with ecg changes was started on IV potassium runs in ED.  K currently 3.8.  Magnesium was 1.5 on admission and currently 1.8. -cardiac monitoring - repleat electrolytes as needed - bmet   T2DM: on lantus 25 units daily at her facility - lantus 15 units  -SSI thin - mealtime 2  units   MS: followed by [facility], disease seems to be stable, major complications are paraplegia and neurogenic bowel and bladder -continue tecfidera -low dose prednisone 5mg  every other day   Normocytic anemia: Hgb 10 and stable  -CBC's   Dispo: Anticipated discharge pending clinical improvement likely in 1-2 days.   Kalman Shan Ludington, DO 10/12/2017, 10:37 AM

## 2017-10-12 NOTE — Consult Note (Addendum)
Consult: wound infection, UTI Requested by: Dr. Orie Fisherman   History of Present Illness: 77 year old female with history of MS and paralysis from the waist down.  She had a chronic Foley catheter for 2 years due to incontinence thought to be from neurogenic bladder contributing to sacral decubiti.  She saw Dr. Gloriann Loan and he had interventional radiology place a suprapubic tube October 10, 2017 by CT guidance.  I reviewed all the images and it did appear to be a clean approach to the bladder.  Dr. Gloriann Loan saw the patient postoperatively and scheduled interventional to change the suprapubic tube and upsize it which is scheduled for October 23, 2017.  The patient was admitted with acute onset of suprapubic pain and drainage.  Fevers to 101 degrees.  Nausea and emesis. Her temp in ED was 100.9 degrees rectal. She had SP tenderness and drainage at the entry site of the suprapubic catheter. UA with tntc white and red cells in the urine.  White blood count 15,000 with 80% neutrophils. In addition, severe hypokalemia with potassium 2.6.  BUN and creatinine normal at 6 and 0.5. Lactic acid 1.2.   The patient reports her pain is improved.  She seems to be leaking more around the catheter this afternoon.  Her bowels are working normally.  She is eating normally.  Past Medical History:  Diagnosis Date  . Degenerative arthritis   . Diabetes mellitus without complication ([place])   . Gait disturbance   . Hypertension   . Multiple sclerosis ([place])   . Neuromuscular disorder (HCC)    Bilateral hand carpal tunnel syndrome  . Obesity   . Spinal stenosis in cervical region   . Spinal stenosis of lumbosacral region   . Spinal stenosis, thoracic    Past Surgical History:  Procedure Laterality Date  . ABDOMINAL HYSTERECTOMY    . BACK SURGERY    . CATARACT EXTRACTION Right   . CHOLECYSTECTOMY    . FEMUR IM NAIL Right 06/02/2014   Procedure: INTRAMEDULLARY (IM) RETROGRADE FEMORAL NAILING;  Surgeon: Johnny Bridge, MD;   Location: [facility];  Service: Orthopedics;  Laterality: Right;  . left hand carpal tunnel surgery    . REPLACEMENT TOTAL KNEE BILATERAL  2004    Home Medications:  Medications Prior to Admission  Medication Sig Dispense Refill Last Dose  . acetaminophen (TYLENOL) 325 MG tablet Take 650 mg by mouth 3 (three) times daily.    10/08/2017 at Unknown time  . Amino Acids-Protein Hydrolys (FEEDING SUPPLEMENT, PRO-STAT SUGAR FREE 64,) LIQD Take 30 mLs by mouth 2 (two) times daily. 900 mL 0 10/08/2017 at Unknown time  . amLODipine (NORVASC) 10 MG tablet Take 10 mg by mouth at bedtime.    10/08/2017 at Unknown time  . ARTIFICIAL TEAR OP Apply 1 drop to eye 4 (four) times daily as needed (dry eyes).    prn  . baclofen (LIORESAL) 10 MG tablet 1.5 tablet in the morning, 1 tablet midday, two tablets at night (Patient taking differently: Take 10-20 mg by mouth See admin instructions. 15 mg  tablet in the morning, 10 mg  tablet midday, 20 mg tablets at night) 135 tablet 5 10/08/2017 at Unknown time  . calcium citrate (CALCITRATE - DOSED IN MG ELEMENTAL CALCIUM) 950 MG tablet Take 200 mg of elemental calcium by mouth daily.   10/07/2017  . cholecalciferol (VITAMIN D) 1000 UNITS tablet Take 1,000 Units by mouth daily.   10/07/2017  . Dimethyl Fumarate (TECFIDERA) 240 MG CPDR Take 1 capsule (  240 mg total) by mouth 2 (two) times daily. 60 capsule 11 10/08/2017 at Unknown time  . DULoxetine (CYMBALTA) 30 MG capsule Take 90 mg by mouth daily.    10/07/2017  . gabapentin (NEURONTIN) 300 MG capsule Take 1 capsule (300 mg total) by mouth 2 (two) times daily. 60 capsule 0 10/08/2017 at Unknown time  . insulin glargine (LANTUS) 100 UNIT/ML injection Inject 25 Units into the skin at bedtime.    10/08/2017 at Unknown time  . Lidocaine (ASPERCREME LIDOCAINE) 4 % PTCH Apply 1 patch topically daily as needed (To painful areas).   prn  . omeprazole (PRILOSEC) 20 MG capsule Take 20 mg by mouth daily.   10/07/2017  . Petrolatum-Zinc Oxide  (SENSI-CARE PROTECTIVE BARRIER) 49-15 % OINT Apply 1 application topically 2 (two) times daily. Apply to buttocks and inner thighs   10/08/2017 at Unknown time  . polyethylene glycol (MIRALAX / GLYCOLAX) packet Take 17 g by mouth daily as needed for mild constipation.    prn  . predniSONE (DELTASONE) 5 MG tablet 1 tablet on odd days, one half tablet on even days (Patient taking differently: Take 2.5-5 mg by mouth See admin instructions. 1 tablet (5mg ) on odd days, one half (2.5mg ) tablet on even days)   10/06/2017  . senna-docusate (SENOKOT-S) 8.6-50 MG tablet Take 2 tablets by mouth at bedtime.   10/08/2017 at Unknown time  . Skin Protectants, Misc. (EUCERIN) cream Apply 1 application topically 2 (two) times daily.   10/08/2017 at Unknown time  . tolterodine (DETROL LA) 4 MG 24 hr capsule Take 4 mg by mouth daily.   10/07/2017   Allergies:  Allergies  Allergen Reactions  . Codeine Other (See Comments)    Difficult breathing and skin problem  . Ultram [Tramadol] Other (See Comments)    Difficult breathing and skin peeling  . Januvia [Sitagliptin] Rash    Blisters    Family History  Problem Relation Age of Onset  . Multiple sclerosis Other        neices.   . Cancer Mother   . Diabetes Mother   . GI Bleed Sister        diverticulitis   Social History:  reports that she has never smoked. She has never used smokeless tobacco. She reports that she does not drink alcohol or use drugs.  ROS: A complete review of systems was performed.  All systems are negative except for pertinent findings as noted. Review of Systems  All other systems reviewed and are negative.    Physical Exam:  Vital signs in last 24 hours: Temp:  [98 F (36.7 C)-98.9 F (37.2 C)] 98.9 F (37.2 C) (04/14 0930) Pulse Rate:  [107-132] 132 (04/14 0930) Resp:  [18] 18 (04/14 0930) BP: (107-126)/(59-79) 109/63 (04/14 0930) SpO2:  [94 %-100 %] 94 % (04/14 0930) Weight:  [83.5 kg (184 lb 1.4 oz)] 83.5 kg (184 lb 1.4 oz)  (04/13 2034) General:  Alert and oriented, No acute distress HEENT: Normocephalic, atraumatic Cardiovascular: Regular rate and rhythm Lungs: Regular rate and effort Abdomen: Soft, nontender, nondistended, no abdominal masses' SP tube site is clean. No drainage. Skin appears normal around it. The pt has had a large BM in the bed Back: No CVA tenderness Extremities: No edema Neurologic: Head and upper ext grossly intact, no movement of LE  Laboratory Data:  Results for orders placed or performed during the hospital encounter of 10/09/17 (from the past 24 hour(s))  Glucose, capillary     Status: Abnormal  Collection Time: 10/11/17  4:12 PM  Result Value Ref Range   Glucose-Capillary 164 (H) 65 - 99 mg/dL  Glucose, capillary     Status: Abnormal   Collection Time: 10/11/17  8:36 PM  Result Value Ref Range   Glucose-Capillary 155 (H) 65 - 99 mg/dL  Glucose, capillary     Status: None   Collection Time: 10/12/17  7:26 AM  Result Value Ref Range   Glucose-Capillary 91 65 - 99 mg/dL  Basic metabolic panel     Status: Abnormal   Collection Time: 10/12/17  9:05 AM  Result Value Ref Range   Sodium 136 135 - 145 mmol/L   Potassium 3.8 3.5 - 5.1 mmol/L   Chloride 93 (L) 101 - 111 mmol/L   CO2 30 22 - 32 mmol/L   Glucose, Bld 149 (H) 65 - 99 mg/dL   BUN 12 6 - 20 mg/dL   Creatinine, Ser 0.50 0.44 - 1.00 mg/dL   Calcium 9.1 8.9 - 10.3 mg/dL   GFR calc non Af Amer >60 >60 mL/min   GFR calc Af Amer >60 >60 mL/min   Anion gap 13 5 - 15  CBC     Status: Abnormal   Collection Time: 10/12/17  9:05 AM  Result Value Ref Range   WBC 18.3 (H) 4.0 - 10.5 K/uL   RBC 4.04 3.87 - 5.11 MIL/uL   Hemoglobin 10.4 (L) 12.0 - 15.0 g/dL   HCT 32.5 (L) 36.0 - 46.0 %   MCV 80.4 78.0 - 100.0 fL   MCH 25.7 (L) 26.0 - 34.0 pg   MCHC 32.0 30.0 - 36.0 g/dL   RDW 19.7 (H) 11.5 - 15.5 %   Platelets 685 (H) 150 - 400 K/uL  Glucose, capillary     Status: Abnormal   Collection Time: 10/12/17 11:40 AM  Result  Value Ref Range   Glucose-Capillary 267 (H) 65 - 99 mg/dL   Recent Results (from the past 240 hour(s))  Blood Culture (routine x 2)     Status: None (Preliminary result)   Collection Time: 10/09/17  9:51 AM  Result Value Ref Range Status   Specimen Description BLOOD LEFT FOREARM  Final   Special Requests   Final    BOTTLES DRAWN AEROBIC AND ANAEROBIC Blood Culture adequate volume   Culture   Final    NO GROWTH 3 DAYS Performed at [facility], [address], [place], [place] [phone]    Report Status PENDING  Incomplete  Blood Culture (routine x 2)     Status: None (Preliminary result)   Collection Time: 10/09/17 10:00 AM  Result Value Ref Range Status   Specimen Description BLOOD LEFT ANTECUBITAL  Final   Special Requests   Final    BOTTLES DRAWN AEROBIC AND ANAEROBIC Blood Culture adequate volume   Culture   Final    NO GROWTH 3 DAYS Performed at [facility], [place] [address], [place], [place] [address]    Report Status PENDING  Incomplete  Urine culture     Status: Abnormal   Collection Time: 10/09/17 10:11 AM  Result Value Ref Range Status   Specimen Description URINE, RANDOM  Final   Special Requests   Final    NONE Performed at [facility], [place] [address], [place], [place] [address]    Culture MULTIPLE SPECIES PRESENT, SUGGEST RECOLLECTION (A)  Final   Report Status 10/10/2017 FINAL  Final  MRSA PCR Screening     Status:  None   Collection Time: 10/10/17  1:06 AM  Result Value Ref Range Status   MRSA by PCR NEGATIVE NEGATIVE Final    Comment:        The GeneXpert MRSA Assay (FDA approved for NASAL specimens only), is one component of a comprehensive MRSA colonization surveillance program. It is not intended to diagnose MRSA infection nor to guide or monitor treatment for MRSA infections. Performed at [facility], [place] [address], [place], [place] [address]    Creatinine: Recent Labs    10/09/17 0930 10/09/17 1830 10/09/17 2201  10/10/17 0922 10/10/17 2103 10/11/17 0837 10/12/17 0905  CREATININE 0.59 0.47 0.51 0.57 0.57 0.46 0.50    Impression/Assessment/plan: MS with incontinence female status post suprapubic tube, white count and abdominal pain-she seems to be improving.  The suprapubic tube site appears normal without drainage.  Her abdomen is soft and nontender.  There was no rebound or guarding.  The suprapubic tube was placed under CT guidance and if she has further pain or fever, recommend CT scan of the abdomen to rule out bowel injury or abscess. I paged and discussed with Dr. Trilby Drummer. Dr. Gloriann Loan was planning to have interventional change the suprapubic tube and upsize it which is scheduled for April 25.  I will go ahead and put an order in for them to change it while she is here on antibiotics and under observation.  Also, I placed an order that nurse can flush suprapubic tube with sterile saline syringe. Judie Bonus 10/12/2017, 2:45 PM

## 2017-10-13 ENCOUNTER — Inpatient Hospital Stay (HOSPITAL_COMMUNITY): Payer: Medicare (Managed Care)

## 2017-10-13 ENCOUNTER — Encounter (HOSPITAL_COMMUNITY): Payer: Self-pay | Admitting: Interventional Radiology

## 2017-10-13 HISTORY — PX: IR CATHETER TUBE CHANGE: IMG717

## 2017-10-13 LAB — BASIC METABOLIC PANEL
Anion gap: 12 (ref 5–15)
BUN: 10 mg/dL (ref 6–20)
CO2: 32 mmol/L (ref 22–32)
CREATININE: 0.54 mg/dL (ref 0.44–1.00)
Calcium: 9.1 mg/dL (ref 8.9–10.3)
Chloride: 93 mmol/L — ABNORMAL LOW (ref 101–111)
Glucose, Bld: 132 mg/dL — ABNORMAL HIGH (ref 65–99)
POTASSIUM: 3.3 mmol/L — AB (ref 3.5–5.1)
SODIUM: 137 mmol/L (ref 135–145)

## 2017-10-13 LAB — CBC
HEMATOCRIT: 30.9 % — AB (ref 36.0–46.0)
Hemoglobin: 9.8 g/dL — ABNORMAL LOW (ref 12.0–15.0)
MCH: 25.9 pg — ABNORMAL LOW (ref 26.0–34.0)
MCHC: 31.7 g/dL (ref 30.0–36.0)
MCV: 81.5 fL (ref 78.0–100.0)
PLATELETS: 662 10*3/uL — AB (ref 150–400)
RBC: 3.79 MIL/uL — AB (ref 3.87–5.11)
RDW: 20.6 % — ABNORMAL HIGH (ref 11.5–15.5)
WBC: 15.1 10*3/uL — AB (ref 4.0–10.5)

## 2017-10-13 LAB — PROTIME-INR
INR: 1.01
PROTHROMBIN TIME: 13.2 s (ref 11.4–15.2)

## 2017-10-13 LAB — MAGNESIUM: Magnesium: 1.5 mg/dL — ABNORMAL LOW (ref 1.7–2.4)

## 2017-10-13 LAB — GLUCOSE, CAPILLARY
GLUCOSE-CAPILLARY: 130 mg/dL — AB (ref 65–99)
GLUCOSE-CAPILLARY: 155 mg/dL — AB (ref 65–99)
Glucose-Capillary: 135 mg/dL — ABNORMAL HIGH (ref 65–99)
Glucose-Capillary: 190 mg/dL — ABNORMAL HIGH (ref 65–99)

## 2017-10-13 MED ORDER — INSULIN ASPART 100 UNIT/ML ~~LOC~~ SOLN
0.0000 [IU] | Freq: Three times a day (TID) | SUBCUTANEOUS | Status: DC
  Administered 2017-10-13 – 2017-10-14 (×2): 2 [IU] via SUBCUTANEOUS
  Administered 2017-10-14: 1 [IU] via SUBCUTANEOUS
  Administered 2017-10-14: 3 [IU] via SUBCUTANEOUS

## 2017-10-13 MED ORDER — INSULIN ASPART 100 UNIT/ML ~~LOC~~ SOLN: 0.0000 [IU] | Freq: Three times a day (TID) | SUBCUTANEOUS | Status: DC

## 2017-10-13 MED ORDER — LIDOCAINE HCL 1 % IJ SOLN
INTRAMUSCULAR | Status: AC
  Filled 2017-10-13: qty 20

## 2017-10-13 MED ORDER — MIDAZOLAM HCL 2 MG/2ML IJ SOLN
INTRAMUSCULAR | Status: AC
  Filled 2017-10-13: qty 4

## 2017-10-13 MED ORDER — POTASSIUM CHLORIDE CRYS ER 20 MEQ PO TBCR
40.0000 meq | EXTENDED_RELEASE_TABLET | Freq: Once | ORAL | Status: AC
  Administered 2017-10-13: 40 meq via ORAL
  Filled 2017-10-13: qty 2

## 2017-10-13 MED ORDER — FENTANYL CITRATE (PF) 100 MCG/2ML IJ SOLN
INTRAMUSCULAR | Status: AC | PRN
  Administered 2017-10-13: 50 ug via INTRAVENOUS

## 2017-10-13 MED ORDER — LIDOCAINE HCL 1 % IJ SOLN
INTRAMUSCULAR | Status: AC | PRN
  Administered 2017-10-13: 8 mL

## 2017-10-13 MED ORDER — MIDAZOLAM HCL 2 MG/2ML IJ SOLN
INTRAMUSCULAR | Status: AC | PRN
  Administered 2017-10-13: 1 mg via INTRAVENOUS

## 2017-10-13 MED ORDER — FENTANYL CITRATE (PF) 100 MCG/2ML IJ SOLN
INTRAMUSCULAR | Status: AC
  Filled 2017-10-13: qty 4

## 2017-10-13 MED ORDER — IOPAMIDOL (ISOVUE-300) INJECTION 61%
INTRAVENOUS | Status: AC
  Administered 2017-10-13: 10 mL
  Filled 2017-10-13: qty 50

## 2017-10-13 NOTE — Procedures (Signed)
Pre procedural Dx: Urinary incontinence Post procedural Dx: Same  Technically successful fluoro guided exchange and upsizing of now 14 Fr suprapubic catheter.   EBL: None  Complications: None immediate  Ronny Bacon, MD Pager #: [phone]

## 2017-10-13 NOTE — Care Management Important Message (Signed)
Important Message  Patient Details  Name: Laura Roberts MRN: [id number] Date of Birth: 1940/12/17   Medicare Important Message Given:  Yes    Countess Biebel Montine Circle 10/13/2017, 1:15 PM

## 2017-10-13 NOTE — Progress Notes (Signed)
   Subjective:  Patient continues to have suprapubic pain but has improved since admission. Denies shortness of breath, does endorse feeling warm overnight.    Objective:  Vital signs in last 24 hours: Vitals:   10/12/17 0930 10/12/17 1716 10/12/17 2032 10/13/17 0447  BP: 109/63 114/66 108/63 107/71  Pulse: (!) 132 (!) 124 (!) 122 (!) 120  Resp: 18 20 20    Temp: 98.9 F (37.2 C) 98.4 F (36.9 C) 99.6 F (37.6 C) 99.3 F (37.4 C)  TempSrc: Oral Oral Oral Oral  SpO2: 94% 95% 94% 93%  Weight:   183 lb 3.2 oz (83.1 kg)   Height:       Physical Exam  Constitutional: She is well-developed, well-nourished, and in no distress.  Cardiovascular: Normal rate, regular rhythm and normal heart sounds. Exam reveals no gallop and no friction rub.  No murmur heard. Pulmonary/Chest: Effort normal and breath sounds normal. No respiratory distress. She has no wheezes. She has no rales.  Abdominal: Soft.  Tenderness at suprapubic region  Skin:  No pustular drainage noted at suprapubic site catheter site, foul odor present   CBC Latest Ref Rng & Units 10/13/2017 10/12/2017 10/11/2017  WBC 4.0 - 10.5 K/uL 15.1(H) 18.3(H) 12.9(H)  Hemoglobin 12.0 - 15.0 g/dL 9.8(L) 10.4(L) 10.1(L)  Hematocrit 36.0 - 46.0 % 30.9(L) 32.5(L) 31.5(L)  Platelets 150 - 400 K/uL 662(H) 685(H) 603(H)   BMP Latest Ref Rng & Units 10/13/2017 10/12/2017 10/11/2017  Glucose 65 - 99 mg/dL 132(H) 149(H) 142(H)  BUN 6 - 20 mg/dL 10 12 7   Creatinine 0.44 - 1.00 mg/dL 0.54 0.50 0.46  BUN/Creat Ratio 12 - 28 - - -  Sodium 135 - 145 mmol/L 137 136 135  Potassium 3.5 - 5.1 mmol/L 3.3(L) 3.8 3.6  Chloride 101 - 111 mmol/L 93(L) 93(L) 92(L)  CO2 22 - 32 mmol/L 32 30 33(H)  Calcium 8.9 - 10.3 mg/dL 9.1 9.1 8.8(L)   Assessment/Plan:  Active Problems:   UTI (urinary tract infection)   Complicated UTI (urinary tract infection)   Hypokalemia   Diabetes mellitus type 2, insulin dependent (HCC)   Hypomagnesemia  Suprapubic Catheter  site infection w/ possible UTI: Still tender at site but stable.  Continued improvement with redness and irritation after abx. Large pannus overlying catheter site.  Urine culture shows too numerous organisms to speciate.  blood culture NGTD.  Will continue with ceftriaxone.  Spoke with Dr. Junious Silk a partner with Dr. Purvis [facility] he consulted IR to replace catheter.  -IR to replace suprapubic catheter today -continue ceftriaxone -wound care   Electrolyte abnormalities.  On admission K was less than 2.0 with ecg changes was started on IV potassium runs in ED.    Magnesium was 1.5 on admission replaced to 1.8.  - cardiac monitoring - repleat electrolytes as needed - bmet   T2DM: on lantus 25 units daily at her facility  - lantus 15 units  - SSI thin will push to tomorrow as pt npo - mealtime 2 units   MS: followed by [facility], disease seems to be stable, major complications are paraplegia and neurogenic bowel and bladder  - continue tecfidera - low dose prednisone 5mg  every other day   Normocytic anemia: Hgb  stable   -CBC's   Dispo: Anticipated discharge pending clinical improvement likely in 1-2 days.   Katherine Roan, MD 10/13/2017, 6:37 AM Vickki Muff MD PGY-1 Internal Medicine Pager # [phone]

## 2017-10-13 NOTE — Progress Notes (Signed)
Internal Medicine Attending:   I saw and examined the patient. I reviewed the resident's note and I agree with the resident's findings and plan as documented in the resident's note.  Patient states that his suprapubic pain continues to improve.  She denies any fevers overnight and has no other complaints.  Patient was initially admitted with worsening abdominal pain as well as fevers and drainage from the suprapubic tube site and thought to have a soft tissue infection at the site. We will continue ceftriaxone for now.  Urology follow-up and recommendations appreciated.  Patient to have suprapubic catheter replaced and upsized by IR today.  No further workup at this point.  Leukocytosis mildly improved today.  Patient remains afebrile.  We will continue to monitor closely.

## 2017-10-13 NOTE — Consult Note (Signed)
Chief Complaint: Patient was seen in consultation today for exchange of supra pubic catheter Chief Complaint  Patient presents with  . Chest Pain   at the request of Dr Junious Silk    Supervising Physician: Sandi Mariscal  Patient Status: [facility] - Out-pt  History of Present Illness: Laura Roberts is a 77 y.o. female   MS SP catheter placed in IR 09/09/17 Pubic pain and catheter leakage Was tentatively scheduled as OP for exchange 4/25  Urology asking for exchange today- while inpt    Past Medical History:  Diagnosis Date  . Degenerative arthritis   . Diabetes mellitus without complication ([place])   . Gait disturbance   . Hypertension   . Multiple sclerosis ([place])   . Neuromuscular disorder (HCC)    Bilateral hand carpal tunnel syndrome  . Obesity   . Spinal stenosis in cervical region   . Spinal stenosis of lumbosacral region   . Spinal stenosis, thoracic     Past Surgical History:  Procedure Laterality Date  . ABDOMINAL HYSTERECTOMY    . BACK SURGERY    . CATARACT EXTRACTION Right   . CHOLECYSTECTOMY    . FEMUR IM NAIL Right 06/02/2014   Procedure: INTRAMEDULLARY (IM) RETROGRADE FEMORAL NAILING;  Surgeon: Johnny Bridge, MD;  Location: [place];  Service: Orthopedics;  Laterality: Right;  . left hand carpal tunnel surgery    . REPLACEMENT TOTAL KNEE BILATERAL  2004    Allergies: Codeine; Ultram [tramadol]; and Januvia [sitagliptin]  Medications: Prior to Admission medications   Medication Sig Start Date End Date Taking? Authorizing Provider  acetaminophen (TYLENOL) 325 MG tablet Take 650 mg by mouth 3 (three) times daily.    Yes [provider]  Amino Acids-Protein Hydrolys (FEEDING SUPPLEMENT, PRO-STAT SUGAR FREE 64,) LIQD Take 30 mLs by mouth 2 (two) times daily. 11/26/16  Yes Minus Liberty, MD  amLODipine (NORVASC) 10 MG tablet Take 10 mg by mouth at bedtime.    Yes [provider]  ARTIFICIAL TEAR OP Apply 1 drop to eye 4 (four) times  daily as needed (dry eyes).    Yes [provider]  baclofen (LIORESAL) 10 MG tablet 1.5 tablet in the morning, 1 tablet midday, two tablets at night Patient taking differently: Take 10-20 mg by mouth See admin instructions. 15 mg  tablet in the morning, 10 mg  tablet midday, 20 mg tablets at night 11/29/15  Yes Millikan, Megan, NP  calcium citrate (CALCITRATE - DOSED IN MG ELEMENTAL CALCIUM) 950 MG tablet Take 200 mg of elemental calcium by mouth daily.   Yes [provider]  cholecalciferol (VITAMIN D) 1000 UNITS tablet Take 1,000 Units by mouth daily.   Yes [provider]  Dimethyl Fumarate (TECFIDERA) 240 MG CPDR Take 1 capsule (240 mg total) by mouth 2 (two) times daily. 03/13/16  Yes Kathrynn Ducking, MD  DULoxetine (CYMBALTA) 30 MG capsule Take 90 mg by mouth daily.    Yes [provider]  gabapentin (NEURONTIN) 300 MG capsule Take 1 capsule (300 mg total) by mouth 2 (two) times daily. 08/18/17  Yes Colbert Ewing, MD  insulin glargine (LANTUS) 100 UNIT/ML injection Inject 25 Units into the skin at bedtime.    Yes [provider]  Lidocaine (ASPERCREME LIDOCAINE) 4 % PTCH Apply 1 patch topically daily as needed (To painful areas).   Yes [provider]  omeprazole (PRILOSEC) 20 MG capsule Take 20 mg by mouth daily.   Yes [provider]  Petrolatum-Zinc Oxide (  SENSI-CARE PROTECTIVE BARRIER) 49-15 % OINT Apply 1 application topically 2 (two) times daily. Apply to buttocks and inner thighs   Yes [provider]  polyethylene glycol (MIRALAX / GLYCOLAX) packet Take 17 g by mouth daily as needed for mild constipation.    Yes [provider]  predniSONE (DELTASONE) 5 MG tablet 1 tablet on odd days, one half tablet on even days Patient taking differently: Take 2.5-5 mg by mouth See admin instructions. 1 tablet (5mg ) on odd days, one half (2.5mg ) tablet on even days 11/22/14  Yes Kathrynn Ducking, MD  senna-docusate  (SENOKOT-S) 8.6-50 MG tablet Take 2 tablets by mouth at bedtime.   Yes [provider]  Skin Protectants, Misc. (EUCERIN) cream Apply 1 application topically 2 (two) times daily.   Yes [provider]  tolterodine (DETROL LA) 4 MG 24 hr capsule Take 4 mg by mouth daily.   Yes [provider]     Family History  Problem Relation Age of Onset  . Multiple sclerosis Other        neices.   . Cancer Mother   . Diabetes Mother   . GI Bleed Sister        diverticulitis    Social History   Socioeconomic History  . Marital status: Widowed    Spouse name: Not on file  . Number of children: 2  . Years of education: 64yr colleg  . Highest education level: Not on file  Occupational History  . Occupation: N/A    Employer: [employer]    Comment: NIGHT RESIDENT [employer]  Social Needs  . Financial resource strain: Not on file  . Food insecurity:    Worry: Not on file    Inability: Not on file  . Transportation needs:    Medical: Not on file    Non-medical: Not on file  Tobacco Use  . Smoking status: Never Smoker  . Smokeless tobacco: Never Used  Substance and Sexual Activity  . Alcohol use: No  . Drug use: No  . Sexual activity: Never  Lifestyle  . Physical activity:    Days per week: Not on file    Minutes per session: Not on file  . Stress: Not on file  Relationships  . Social connections:    Talks on phone: Not on file    Gets together: Not on file    Attends religious service: Not on file    Active member of club or organization: Not on file    Attends meetings of clubs or organizations: Not on file    Relationship status: Not on file  Other Topics Concern  . Not on file  Social History Narrative   Patient is widowed with 2 children   Patient is right handed   Patient has 2 yrs of college   Patient drinks 2-3 cups of tea daily    Review of Systems: A 12 point ROS discussed and pertinent positives are indicated in the HPI above.  All other  systems are negative.  Review of Systems  Constitutional: Positive for fever. Negative for activity change, appetite change and fatigue.  Respiratory: Negative for cough and shortness of breath.   Cardiovascular: Negative for chest pain.  Gastrointestinal: Positive for abdominal pain.  Psychiatric/Behavioral: Negative for behavioral problems and confusion.    Vital Signs: BP 115/66 (BP Location: Right Arm)   Pulse (!) 110   Temp 98.1 F (36.7 C) (Oral)   Resp 18   Ht 5\' 2"  (  1.575 m)   Wt 183 lb 3.2 oz (83.1 kg)   SpO2 96%   BMI 33.51 kg/m   Physical Exam  Constitutional: She is oriented to person, place, and time. She appears well-developed.  Cardiovascular: Normal rate and regular rhythm.  Pulmonary/Chest: Effort normal and breath sounds normal.  Abdominal: Soft. Bowel sounds are normal.  Musculoskeletal:       Right lower leg: She exhibits no edema.       Left lower leg: She exhibits no edema.  Neurological: She is alert and oriented to person, place, and time.  Skin: Skin is warm and dry.  Psychiatric: She has a normal mood and affect. Her behavior is normal.  Nursing note and vitals reviewed.   Imaging: Dg Chest 2 View  Result Date: 10/09/2017 CLINICAL DATA:  Chest pain.  Recent cough and fever. EXAM: CHEST - 2 VIEW COMPARISON:  Chest x-ray 08/13/2017. FINDINGS: Mediastinum hilar structures normal. Heart size stable. Low lung volumes with mild basilar atelectasis. No pleural effusion or pneumothorax. Thoracic spine degenerative changes and scoliosis. IMPRESSION: No acute cardiopulmonary disease. Electronically Signed   By: Marcello Moores  Register   On: 10/09/2017 11:06    Labs:  CBC: Recent Labs    10/10/17 7371 10/11/17 0837 10/12/17 0905 10/13/17 0720  WBC 13.2* 12.9* 18.3* 15.1*  HGB 10.3* 10.1* 10.4* 9.8*  HCT 32.9* 31.5* 32.5* 30.9*  PLT 662* 603* 685* 662*    COAGS: Recent Labs    11/22/16 1536 09/09/17 1250  INR 1.08 1.05    BMP: Recent Labs     10/10/17 2103 10/11/17 0837 10/12/17 0905 10/13/17 0720  NA 137 135 136 137  K 4.3 3.6 3.8 3.3*  CL 93* 92* 93* 93*  CO2 33* 33* 30 32  GLUCOSE 236* 142* 149* 132*  BUN 11 7 12 10   CALCIUM 8.8* 8.8* 9.1 9.1  CREATININE 0.57 0.46 0.50 0.54  GFRNONAA >60 >60 >60 >60  GFRAA >60 >60 >60 >60    LIVER FUNCTION TESTS: Recent Labs    03/06/17 1427 08/13/17 1831 09/09/17 1250 10/09/17 0930 10/10/17 2103  BILITOT 0.3 2.1* 0.8 1.1  --   AST 11 17 22  11*  --   ALT 9 13* 16 10*  --   ALKPHOS 79 87 74 78  --   PROT 7.2 7.2 7.4 6.2*  --   ALBUMIN 4.3 2.2* 2.8* 2.4* 2.2*    TUMOR MARKERS: No results for input(s): AFPTM, CEA, CA199, CHROMGRNA in the last 8760 hours.  Assessment and Plan:  Multiple sclerosis Neurogenic bladder Supra pubic cath placed in IR 09/09/17 No with some leakage at site Leukocytosis; UTI Scheduled for SP catheter exchange in IR Pt is aware of procedure benefits and risks including But not limited to Infection; bleeding; damage to structure Agreeable to proceed consent signed andin chart  Thank you for this interesting consult.  I greatly enjoyed meeting Laura Roberts and look forward to participating in their care.  A copy of this report was sent to the requesting provider on this date.  Electronically Signed: Lavonia Drafts, PA-C 10/13/2017, 9:27 AM   I spent a total of 20 Minutes    in face to face in clinical consultation, greater than 50% of which was counseling/coordinating care for SP catheter exchange

## 2017-10-14 DIAGNOSIS — R000 Tachycardia, unspecified: Secondary | ICD-10-CM

## 2017-10-14 LAB — CBC
HEMATOCRIT: 31.2 % — AB (ref 36.0–46.0)
HEMOGLOBIN: 10 g/dL — AB (ref 12.0–15.0)
MCH: 25.9 pg — ABNORMAL LOW (ref 26.0–34.0)
MCHC: 32.1 g/dL (ref 30.0–36.0)
MCV: 80.8 fL (ref 78.0–100.0)
Platelets: 605 10*3/uL — ABNORMAL HIGH (ref 150–400)
RBC: 3.86 MIL/uL — ABNORMAL LOW (ref 3.87–5.11)
RDW: 20.2 % — AB (ref 11.5–15.5)
WBC: 15.2 10*3/uL — ABNORMAL HIGH (ref 4.0–10.5)

## 2017-10-14 LAB — GLUCOSE, CAPILLARY
GLUCOSE-CAPILLARY: 183 mg/dL — AB (ref 65–99)
Glucose-Capillary: 125 mg/dL — ABNORMAL HIGH (ref 65–99)
Glucose-Capillary: 240 mg/dL — ABNORMAL HIGH (ref 65–99)
Glucose-Capillary: 185 mg/dL — ABNORMAL HIGH (ref 65–99)

## 2017-10-14 LAB — BASIC METABOLIC PANEL WITH GFR
Anion gap: 13 (ref 5–15)
BUN: 9 mg/dL (ref 6–20)
CO2: 27 mmol/L (ref 22–32)
Calcium: 8.9 mg/dL (ref 8.9–10.3)
Chloride: 96 mmol/L — ABNORMAL LOW (ref 101–111)
Creatinine, Ser: 0.57 mg/dL (ref 0.44–1.00)
GFR calc Af Amer: >60 mL/min (ref >60)
GFR calc non Af Amer: >60 mL/min (ref >60)
Glucose, Bld: 137 mg/dL — ABNORMAL HIGH (ref 65–99)
Potassium: 3.5 mmol/L (ref 3.5–5.1)
Sodium: 136 mmol/L (ref 135–145)

## 2017-10-14 LAB — CULTURE, BLOOD (ROUTINE X 2)
Culture: NO GROWTH
Culture: NO GROWTH
SPECIAL REQUESTS: ADEQUATE
Special Requests: ADEQUATE

## 2017-10-14 LAB — MAGNESIUM: Magnesium: 1.6 mg/dL — ABNORMAL LOW (ref 1.7–2.4)

## 2017-10-14 MED ORDER — INSULIN GLARGINE 100 UNIT/ML ~~LOC~~ SOLN: 20.0000 [IU] | Freq: Every day | SUBCUTANEOUS | 11 refills | Status: DC

## 2017-10-14 MED ORDER — MAGNESIUM SULFATE 2 GM/50ML IV SOLN
2.0000 g | Freq: Once | INTRAVENOUS | Status: AC
  Administered 2017-10-14: 2 g via INTRAVENOUS
  Filled 2017-10-14: qty 50

## 2017-10-14 MED ORDER — CEFDINIR 300 MG PO CAPS: 300.0000 mg | ORAL_CAPSULE | Freq: Two times a day (BID) | ORAL | 0 refills | Status: AC

## 2017-10-14 MED ORDER — POTASSIUM CHLORIDE CRYS ER 20 MEQ PO TBCR
40.0000 meq | EXTENDED_RELEASE_TABLET | Freq: Once | ORAL | Status: AC
  Administered 2017-10-14: 40 meq via ORAL
  Filled 2017-10-14: qty 2

## 2017-10-14 NOTE — Progress Notes (Signed)
Gave report to accepting RN at [facility]. PTAR called and IV removed. Night nurse aware. Medications in pharmacy.   De Nurse, RN

## 2017-10-14 NOTE — Progress Notes (Signed)
Internal Medicine Attending:   I saw and examined the patient. I reviewed the resident's note and I agree with the resident's findings and plan as documented in the resident's note.  Patient is much improved today.  She states that she has no pain over her suprapubic catheter site.  Patient was initially admitted for suprapubic catheter site infection.  Will DC ceftriaxone and transition to [facility] on discharge to complete a 10-day total course of antibiotics.  Continue with wound care around the site.  IR changed the suprapubic catheter yesterday.  Patient to follow-up with urology as an outpatient.  No further workup for now.  Patient stable for discharge to SNF today.

## 2017-10-14 NOTE — NC FL2 (Signed)
[place] LEVEL OF CARE SCREENING TOOL     IDENTIFICATION  Patient Name: Laura Roberts Birthdate: 1941/02/02 Sex: female Admission Date (Current Location): 10/09/2017  [facility] and [place] Number:  Herbalist and Address:  [facility] [place]. [facility] [place] [address], [place], [place] [address]      Provider Number: [id number]  Attending Physician Name and Address:  [name], MD  Relative Name and Phone Number:  Fanta Wimberley III - son, [phone]    Current Level of Care: Hospital Recommended Level of Care: Skilled Nursing Facility(From [facility]) Prior Approval Number:    Date Approved/Denied:   PASRR Number:    Discharge Plan: SNF([facility])    Current Diagnoses: Patient Active Problem List   Diagnosis Date Noted  . Hypokalemia   . Diabetes mellitus type 2, insulin dependent ([place])   . Hypomagnesemia   . UTI (urinary tract infection) 10/09/2017  . Complicated UTI (urinary tract infection) 10/09/2017  . Febrile illness   . UTI (urinary tract infection) due to urinary indwelling Foley catheter ([place]) 08/13/2017  . Decubitus ulcer of buttock, stage 4 ([place]) 11/22/2016  . Sepsis ([place]) 11/22/2016  . Leukocytosis   . Paraplegia ([place]) 06/01/2014  . Closed supracondylar fracture of right femur, periprosthetic 06/01/2014  . Femoral fracture ([place]) 06/01/2014  . Encounter for therapeutic drug monitoring 01/05/2014  . Foot drop, bilateral 10/13/2012  . Abnormality of gait 10/13/2012  . Neurogenic bladder 09/02/2012  . Neurogenic pain of lower extremity 09/02/2012  . Weakness 07/21/2012  . HTN (hypertension) 07/21/2012  . DM (diabetes mellitus) ([place]) 07/21/2012  . HLD (hyperlipidemia) 07/21/2012  . Tachycardia 07/21/2012  . GERD (gastroesophageal reflux disease) 07/21/2012  . Multiple sclerosis (HCC)     Orientation RESPIRATION BLADDER Height & Weight     Self, Situation, Place, Time  Normal Indwelling  catheter(Subra-pubic catheter) Weight: 183 lb 3.2 oz (83.1 kg) Height:  5\' 2"  (157.5 cm)  BEHAVIORAL SYMPTOMS/MOOD NEUROLOGICAL BOWEL NUTRITION STATUS      Continent Diet(Carb modified)  AMBULATORY STATUS COMMUNICATION OF NEEDS Skin   Total Care(Paralysis from waist down) Verbally Other (Comment)(Stage 2 pressure injuries to buttocks (foam dressing) and sacrum; Unstageable pressure injuries to heel with foam dressing and buttocks with foam dressing)                       Personal Care Assistance Level of Assistance  Bathing, Feeding, Dressing Bathing Assistance: Limited assistance Feeding assistance: Limited assistance Dressing Assistance: Limited assistance     Functional Limitations Info  Sight, Hearing, Speech Sight Info: Adequate Hearing Info: Adequate Speech Info: Adequate    SPECIAL CARE FACTORS FREQUENCY                       Contractures Contractures Info: Not present    Additional Factors Info  Code Status, Allergies, Insulin Sliding Scale Code Status Info: DNR Allergies Info: Codeine, Ultram Tramadol, Januvia Sitagliptin   Insulin Sliding Scale Info: 0-9 Units 3 times a day with meals       Current Medications (10/14/2017):  This is the current hospital active medication list Current Facility-Administered Medications  Medication Dose Route Frequency Provider Last Rate Last Dose  . acetaminophen (TYLENOL) tablet 650 mg  650 mg Oral Q6H PRN Shela Leff, MD   650 mg at 10/11/17 1603   Or  . acetaminophen (TYLENOL) suppository 650 mg  650 mg Rectal Q6H PRN Shela Leff, MD      .  baclofen (LIORESAL) tablet 10 mg  10 mg Oral Q24H Annia Belt, MD   10 mg at 10/14/17 1357  . baclofen (LIORESAL) tablet 15 mg  15 mg Oral Daily Annia Belt, MD   15 mg at 10/14/17 1115  . baclofen (LIORESAL) tablet 20 mg  20 mg Oral QHS Annia Belt, MD   20 mg at 10/13/17 2339  . calcium citrate (CALCITRATE - dosed in mg elemental calcium)  tablet 200 mg of elemental calcium  200 mg of elemental calcium Oral Daily Shela Leff, MD   200 mg of elemental calcium at 10/14/17 1117  . cefTRIAXone (ROCEPHIN) 1 g in sodium chloride 0.9 % 100 mL IVPB  1 g Intravenous Q24H Annia Belt, MD 200 mL/hr at 10/13/17 1748 1 g at 10/13/17 1748  . cholecalciferol (VITAMIN D) tablet 1,000 Units  1,000 Units Oral Daily Shela Leff, MD   1,000 Units at 10/14/17 1116  . Dimethyl Fumarate CPDR 240 mg  240 mg Oral BID Shela Leff, MD   240 mg at 10/14/17 1117  . DULoxetine (CYMBALTA) DR capsule 90 mg  90 mg Oral Daily Shela Leff, MD   90 mg at 10/14/17 1113  . enoxaparin (LOVENOX) injection 40 mg  40 mg Subcutaneous Q24H Shela Leff, MD   40 mg at 10/13/17 2339  . feeding supplement (PRO-STAT SUGAR FREE 64) liquid 30 mL  30 mL Oral TID BM Annia Belt, MD   30 mL at 10/14/17 1357  . fesoterodine (TOVIAZ) tablet 4 mg  4 mg Oral Daily Shela Leff, MD   4 mg at 10/14/17 1117  . gabapentin (NEURONTIN) capsule 300 mg  300 mg Oral BID Shela Leff, MD   300 mg at 10/14/17 1115  . hydrocerin (EUCERIN) cream 1 application  1 application Topical BID Shela Leff, MD   1 application at 12/45/80 2342  . insulin aspart (novoLOG) injection 0-9 Units  0-9 Units Subcutaneous TID WC Katherine Roan, MD   3 Units at 10/14/17 1211  . insulin aspart (novoLOG) injection 2 Units  2 Units Subcutaneous TID WC Katherine Roan, MD   2 Units at 10/14/17 1212  . insulin glargine (LANTUS) injection 15 Units  15 Units Subcutaneous QHS Shela Leff, MD   15 Units at 10/13/17 2340  . multivitamin (PROSIGHT) tablet 1 tablet  1 tablet Oral Daily Annia Belt, MD   1 tablet at 10/14/17 1116  . nutrition supplement (JUVEN) (JUVEN) powder packet 1 packet  1 packet Oral BID BM Annia Belt, MD   1 packet at 10/12/17 1809  . ondansetron (ZOFRAN) injection 4 mg  4 mg Intravenous Q6H PRN Shela Leff, MD      . pantoprazole (PROTONIX) EC tablet 40 mg  40 mg Oral Daily Shela Leff, MD   40 mg at 10/14/17 1116  . polyethylene glycol (MIRALAX / GLYCOLAX) packet 17 g  17 g Oral Daily PRN Shela Leff, MD      . predniSONE (DELTASONE) tablet 2 mg  2 mg Oral Derrek Gu, MD   2 mg at 10/14/17 1118  . predniSONE (DELTASONE) tablet 5 mg  5 mg Oral Derrek Gu, MD   5 mg at 10/13/17 1054  . senna-docusate (Senokot-S) tablet 2 tablet  2 tablet Oral QHS Shela Leff, MD   2 tablet at 10/11/17 2133  . sodium chloride flush (NS) 0.9 % injection 3 mL  3 mL Intravenous Q12H Annia Belt, MD  3 mL at 10/14/17 1119  . sodium chloride flush (NS) 0.9 % injection 3 mL  3 mL Intravenous PRN Annia Belt, MD      . zinc oxide (BALMEX) 11.3 % cream   Topical BID Shela Leff, MD         Discharge Medications: Please see discharge summary for a list of discharge medications.  Relevant Imaging Results:  Relevant Lab Results:   Additional Information ss#[id number]  Sable Feil, LCSW

## 2017-10-14 NOTE — Progress Notes (Signed)
Subjective:  Pt reports feeling well today after the procedure yesterday afternoon.  She feels like she has progressed every day and that her suprapubic pain continues to improve.     Objective:  Vital signs in last 24 hours: Vitals:   10/13/17 1637 10/13/17 2145 10/14/17 0457 10/14/17 0926  BP: 107/64 128/78 (!) 101/54 119/70  Pulse: (!) 126 (!) 123 (!) 115 (!) 128  Resp: 18   20  Temp: 98.7 F (37.1 C)  99.3 F (37.4 C) 99.5 F (37.5 C)  TempSrc: Oral  Oral Oral  SpO2: 97% 100% 95% 92%  Weight:      Height:       Physical Exam  Constitutional: She is well-developed, well-nourished, and in no distress.  Cardiovascular: Normal rate, regular rhythm and normal heart sounds. Exam reveals no gallop and no friction rub.  No murmur heard. Pulmonary/Chest: Effort normal and breath sounds normal. No respiratory distress. She has no wheezes. She has no rales.  Abdominal: Soft.  Tenderness at suprapubic region with improvement Skin:  No pustular drainage noted at suprapubic site catheter site, foul odor present   CBC Latest Ref Rng & Units 10/14/2017 10/13/2017 10/12/2017  WBC 4.0 - 10.5 K/uL 15.2(H) 15.1(H) 18.3(H)  Hemoglobin 12.0 - 15.0 g/dL 10.0(L) 9.8(L) 10.4(L)  Hematocrit 36.0 - 46.0 % 31.2(L) 30.9(L) 32.5(L)  Platelets 150 - 400 K/uL 605(H) 662(H) 685(H)   BMP Latest Ref Rng & Units 10/14/2017 10/13/2017 10/12/2017  Glucose 65 - 99 mg/dL 137(H) 132(H) 149(H)  BUN 6 - 20 mg/dL 9 10 12   Creatinine 0.44 - 1.00 mg/dL 0.57 0.54 0.50  BUN/Creat Ratio 12 - 28 - - -  Sodium 135 - 145 mmol/L 136 137 136  Potassium 3.5 - 5.1 mmol/L 3.5 3.3(L) 3.8  Chloride 101 - 111 mmol/L 96(L) 93(L) 93(L)  CO2 22 - 32 mmol/L 27 32 30  Calcium 8.9 - 10.3 mg/dL 8.9 9.1 9.1   Assessment/Plan:  Active Problems:   UTI (urinary tract infection)   Complicated UTI (urinary tract infection)   Hypokalemia   Diabetes mellitus type 2, insulin dependent (HCC)   Hypomagnesemia  Suprapubic Catheter site  infection w/ possible UTI: Still tender at site but stable.  Continued improvement with redness and irritation after abx. Large pannus overlying catheter site.  Urine culture shows too numerous organisms to speciate.  blood culture NGTD.  Will continue with ceftriaxone.  Spoke with Dr. Junious Silk a partner with Dr. Purvis [facility] he consulted IR to replace catheter. Suprapubic catheter replaced and upsized by IR 10/13/17  -continue ceftriaxone will switch to omincef on discharge for four more days of abx therapy -wound care   Electrolyte abnormalities.  On admission K was less than 2.0 with ecg changes was started on IV potassium runs in ED.    Magnesium was 1.5 on admission replaced.  - cardiac monitoring - repleat electrolytes as needed - bmet   T2DM: on lantus 25 units daily at her facility  - lantus 15 units  - SSI thin will push to tomorrow as pt npo - mealtime 2 units   MS: followed by [facility], disease seems to be stable, major complications are paraplegia and neurogenic bowel and bladder  - continue tecfidera - low dose prednisone 5mg  every other day   Normocytic anemia: Hgb  stable   -CBC's   Dispo: Anticipated discharge back to [place] facility today.   Katherine Roan, MD 10/14/2017, 11:54 AM Vickki Muff MD PGY-1 Internal Medicine  Pager # [phone]

## 2017-10-14 NOTE — Clinical Social Work Note (Signed)
Clinical Social Work Assessment  Patient Details  Name: Laura Roberts MRN: [id number] Date of Birth: 12/06/1940  Date of referral:  10/13/17               Reason for consult:  Facility Placement, Discharge Planning                Permission sought to share information with:  Family Supports Permission granted to share information::  Yes, Verbal Permission Granted  Name::     Laura Roberts   Agency::     Relationship::  Son  Contact Information:  [phone]  Housing/Transportation Living arrangements for the past 2 months:  [place] of Information:  Patient, Other (Comment Required)([facility] [place] [facility] on 10/13/17) Patient Interpreter Needed:  None Criminal Activity/Legal Involvement Pertinent to Current Situation/Hospitalization:  No - Comment as needed Significant Relationships:  Adult Children Lives with:  Facility Resident([facility]) Do you feel safe going back to the place where you live?  Yes Need for family participation in patient care:  Yes (Comment)  Care giving concerns:  Patient and son Leane Para expressed no concerns regarding patient's care at [facility] assessment / plan:  CSW talked with [facility] LCSW Yolande Jolly on 415. She confirmed that patient is from [place], is LTC and will return there at discharge. CSW talked with patient and her son (by phone) on 4/16 and patient indicated that she is returning to [facility] and advised CSW that it was fine to talk with her son. Ms. Paul was sitting up in bed and was alert, oriented and engaged easily in conversation with CSW.   Mr. Martorano reported that he is looking for a facility in the [place] area. Per son [facility] will be discontinuing services for his mother. He also reported that his mother has Medicaid.  Employment status:  Retired Forensic scientist:  Other (Comment Required)([facility] manages [employer]) PT Recommendations:  No Follow Up Information /  Referral to community resources:  Other (Comment Required)(None needed or requested as patient from [facility] and is a [facility] participant)  Patient/Family's Response to care:  Patient reported no concerns regarding her care during hospitalization.  Patient/Family's Understanding of and Emotional Response to Diagnosis, Current Treatment, and Prognosis:  Patient was informed that she could request her discharge instructions from her nurse.   Emotional Assessment Appearance:  Appears younger than stated age Attitude/Demeanor/Rapport:  Engaged Affect (typically observed):  Pleasant, Appropriate Orientation:  Oriented to Self, Oriented to Situation, Oriented to Place, Oriented to  Time Alcohol / Substance use:  Never Used Psych involvement (Current and /or in the community):  No (Comment)  Discharge Needs  Concerns to be addressed:  Discharge Planning Concerns Readmission within the last 30 days:  No Current discharge risk:  None Barriers to Discharge:  No Barriers Identified   Sable Feil, LCSW 10/14/2017, 5:28 PM

## 2017-10-14 NOTE — Clinical Social Work Note (Signed)
Patient medically stable for discharge and will be returning to [facility]. Son contacted and informed of discharge. Admissions staff person Daleen Snook contacted and informed of discharge and d/c clinicals transmitted to facility. [name] will be transported by PTAR back to [place]. CSW signing due to patient's discharge from hospital.  [name], MSW, [place] Licensed Clinical Social Worker Clinical Social Work [place] [phone]

## 2017-10-15 NOTE — Progress Notes (Signed)
D/c'd via stretcher with ambulance. All belongings sent with pt.

## 2017-10-16 NOTE — Telephone Encounter (Signed)
Late entry: Faith M,RN had called LVM for pt to call about new insurance info 10/09/17.   No return call yet from pt. I called and spoke with her today. She states she spoke with Education officer, museum and they are currently still working on Theatre manager. She is agreeable to call me once she hears something.

## 2017-10-23 ENCOUNTER — Ambulatory Visit (HOSPITAL_COMMUNITY): Admission: RE | Admit: 2017-10-23 | Payer: Medicare (Managed Care) | Source: Ambulatory Visit

## 2017-10-24 ENCOUNTER — Encounter (HOSPITAL_COMMUNITY): Payer: Self-pay | Admitting: Emergency Medicine

## 2017-10-24 ENCOUNTER — Encounter (HOSPITAL_COMMUNITY)
Admission: EM | Disposition: A | Discharge status: Skilled Nursing Facility | Payer: Self-pay | Source: Home / Self Care | Attending: Internal Medicine

## 2017-10-24 ENCOUNTER — Emergency Department (HOSPITAL_COMMUNITY): Payer: Medicare (Managed Care) | Admitting: Certified Registered"

## 2017-10-24 ENCOUNTER — Inpatient Hospital Stay (HOSPITAL_COMMUNITY)
Admission: EM | Admit: 2017-10-24 | Discharge: 2017-10-29 | DRG: 853 | Disposition: A | Discharge status: Skilled Nursing Facility | Payer: Medicare (Managed Care) | Attending: Internal Medicine | Admitting: Internal Medicine

## 2017-10-24 ENCOUNTER — Emergency Department (HOSPITAL_COMMUNITY): Payer: Medicare (Managed Care)

## 2017-10-24 DIAGNOSIS — Z66 Do not resuscitate: Secondary | ICD-10-CM | POA: Diagnosis not present

## 2017-10-24 DIAGNOSIS — D75839 Thrombocytosis, unspecified: Secondary | ICD-10-CM

## 2017-10-24 DIAGNOSIS — A419 Sepsis, unspecified organism: Principal | ICD-10-CM | POA: Diagnosis present

## 2017-10-24 DIAGNOSIS — Z885 Allergy status to narcotic agent status: Secondary | ICD-10-CM

## 2017-10-24 DIAGNOSIS — E871 Hypo-osmolality and hyponatremia: Secondary | ICD-10-CM | POA: Diagnosis present

## 2017-10-24 DIAGNOSIS — M21371 Foot drop, right foot: Secondary | ICD-10-CM | POA: Diagnosis present

## 2017-10-24 DIAGNOSIS — I10 Essential (primary) hypertension: Secondary | ICD-10-CM | POA: Diagnosis not present

## 2017-10-24 DIAGNOSIS — E118 Type 2 diabetes mellitus with unspecified complications: Secondary | ICD-10-CM

## 2017-10-24 DIAGNOSIS — L02416 Cutaneous abscess of left lower limb: Secondary | ICD-10-CM | POA: Diagnosis present

## 2017-10-24 DIAGNOSIS — Z87440 Personal history of urinary (tract) infections: Secondary | ICD-10-CM

## 2017-10-24 DIAGNOSIS — D473 Essential (hemorrhagic) thrombocythemia: Secondary | ICD-10-CM

## 2017-10-24 DIAGNOSIS — L89610 Pressure ulcer of right heel, unstageable: Secondary | ICD-10-CM | POA: Diagnosis present

## 2017-10-24 DIAGNOSIS — Z960 Presence of urogenital implants: Secondary | ICD-10-CM | POA: Diagnosis present

## 2017-10-24 DIAGNOSIS — L89319 Pressure ulcer of right buttock, unspecified stage: Secondary | ICD-10-CM | POA: Diagnosis present

## 2017-10-24 DIAGNOSIS — Z743 Need for continuous supervision: Secondary | ICD-10-CM | POA: Diagnosis not present

## 2017-10-24 DIAGNOSIS — E785 Hyperlipidemia, unspecified: Secondary | ICD-10-CM | POA: Diagnosis not present

## 2017-10-24 DIAGNOSIS — G8220 Paraplegia, unspecified: Secondary | ICD-10-CM | POA: Diagnosis present

## 2017-10-24 DIAGNOSIS — G35 Multiple sclerosis: Secondary | ICD-10-CM | POA: Diagnosis not present

## 2017-10-24 DIAGNOSIS — E119 Type 2 diabetes mellitus without complications: Secondary | ICD-10-CM | POA: Diagnosis not present

## 2017-10-24 DIAGNOSIS — M21372 Foot drop, left foot: Secondary | ICD-10-CM | POA: Diagnosis present

## 2017-10-24 DIAGNOSIS — Z96653 Presence of artificial knee joint, bilateral: Secondary | ICD-10-CM | POA: Diagnosis present

## 2017-10-24 DIAGNOSIS — E669 Obesity, unspecified: Secondary | ICD-10-CM | POA: Diagnosis present

## 2017-10-24 DIAGNOSIS — Z6835 Body mass index (BMI) 35.0-35.9, adult: Secondary | ICD-10-CM

## 2017-10-24 DIAGNOSIS — L89304 Pressure ulcer of unspecified buttock, stage 4: Secondary | ICD-10-CM | POA: Diagnosis present

## 2017-10-24 DIAGNOSIS — Z794 Long term (current) use of insulin: Secondary | ICD-10-CM

## 2017-10-24 DIAGNOSIS — D729 Disorder of white blood cells, unspecified: Secondary | ICD-10-CM

## 2017-10-24 DIAGNOSIS — Z833 Family history of diabetes mellitus: Secondary | ICD-10-CM

## 2017-10-24 DIAGNOSIS — L89329 Pressure ulcer of left buttock, unspecified stage: Secondary | ICD-10-CM | POA: Diagnosis present

## 2017-10-24 DIAGNOSIS — E860 Dehydration: Secondary | ICD-10-CM | POA: Diagnosis present

## 2017-10-24 DIAGNOSIS — B954 Other streptococcus as the cause of diseases classified elsewhere: Secondary | ICD-10-CM | POA: Diagnosis not present

## 2017-10-24 DIAGNOSIS — R000 Tachycardia, unspecified: Secondary | ICD-10-CM | POA: Diagnosis present

## 2017-10-24 DIAGNOSIS — R1030 Lower abdominal pain, unspecified: Secondary | ICD-10-CM | POA: Diagnosis not present

## 2017-10-24 DIAGNOSIS — R6510 Systemic inflammatory response syndrome (SIRS) of non-infectious origin without acute organ dysfunction: Secondary | ICD-10-CM

## 2017-10-24 DIAGNOSIS — L03314 Cellulitis of groin: Secondary | ICD-10-CM

## 2017-10-24 DIAGNOSIS — Z79899 Other long term (current) drug therapy: Secondary | ICD-10-CM

## 2017-10-24 DIAGNOSIS — N319 Neuromuscular dysfunction of bladder, unspecified: Secondary | ICD-10-CM | POA: Diagnosis present

## 2017-10-24 DIAGNOSIS — Z820 Family history of epilepsy and other diseases of the nervous system: Secondary | ICD-10-CM

## 2017-10-24 DIAGNOSIS — L89153 Pressure ulcer of sacral region, stage 3: Secondary | ICD-10-CM | POA: Diagnosis not present

## 2017-10-24 DIAGNOSIS — L02214 Cutaneous abscess of groin: Secondary | ICD-10-CM | POA: Diagnosis not present

## 2017-10-24 DIAGNOSIS — Z9359 Other cystostomy status: Secondary | ICD-10-CM

## 2017-10-24 DIAGNOSIS — R279 Unspecified lack of coordination: Secondary | ICD-10-CM | POA: Diagnosis not present

## 2017-10-24 DIAGNOSIS — K429 Umbilical hernia without obstruction or gangrene: Secondary | ICD-10-CM | POA: Diagnosis present

## 2017-10-24 DIAGNOSIS — D649 Anemia, unspecified: Secondary | ICD-10-CM | POA: Diagnosis present

## 2017-10-24 DIAGNOSIS — E876 Hypokalemia: Secondary | ICD-10-CM | POA: Diagnosis present

## 2017-10-24 DIAGNOSIS — M1990 Unspecified osteoarthritis, unspecified site: Secondary | ICD-10-CM | POA: Diagnosis present

## 2017-10-24 DIAGNOSIS — G35D Multiple sclerosis, unspecified: Secondary | ICD-10-CM | POA: Diagnosis present

## 2017-10-24 DIAGNOSIS — K219 Gastro-esophageal reflux disease without esophagitis: Secondary | ICD-10-CM | POA: Diagnosis not present

## 2017-10-24 DIAGNOSIS — Z7401 Bed confinement status: Secondary | ICD-10-CM

## 2017-10-24 DIAGNOSIS — Z888 Allergy status to other drugs, medicaments and biological substances status: Secondary | ICD-10-CM

## 2017-10-24 DIAGNOSIS — E878 Other disorders of electrolyte and fluid balance, not elsewhere classified: Secondary | ICD-10-CM | POA: Diagnosis present

## 2017-10-24 DIAGNOSIS — K7460 Unspecified cirrhosis of liver: Secondary | ICD-10-CM | POA: Diagnosis present

## 2017-10-24 DIAGNOSIS — R531 Weakness: Secondary | ICD-10-CM

## 2017-10-24 DIAGNOSIS — E1140 Type 2 diabetes mellitus with diabetic neuropathy, unspecified: Secondary | ICD-10-CM

## 2017-10-24 DIAGNOSIS — Z7952 Long term (current) use of systemic steroids: Secondary | ICD-10-CM

## 2017-10-24 HISTORY — PX: GROIN DISSECTION: SHX5250

## 2017-10-24 LAB — TSH: TSH: 0.182 u[IU]/mL — ABNORMAL LOW (ref 0.350–4.500)

## 2017-10-24 LAB — BASIC METABOLIC PANEL
Anion gap: 17 — ABNORMAL HIGH (ref 5–15)
BUN: 9 mg/dL (ref 6–20)
CALCIUM: 9.4 mg/dL (ref 8.9–10.3)
CO2: 29 mmol/L (ref 22–32)
CREATININE: 0.55 mg/dL (ref 0.44–1.00)
Chloride: 88 mmol/L — ABNORMAL LOW (ref 101–111)
GFR calc Af Amer: >60 mL/min (ref >60)
GLUCOSE: 173 mg/dL — AB (ref 65–99)
Potassium: 2.7 mmol/L — CL (ref 3.5–5.1)
Sodium: 134 mmol/L — ABNORMAL LOW (ref 135–145)

## 2017-10-24 LAB — CBC WITH DIFFERENTIAL/PLATELET
BASOS PCT: 0 %
Basophils Absolute: 0 10*3/uL (ref 0.0–0.1)
EOS ABS: 0 10*3/uL (ref 0.0–0.7)
Eosinophils Relative: 0 %
HCT: 32.1 % — ABNORMAL LOW (ref 36.0–46.0)
Hemoglobin: 10.5 g/dL — ABNORMAL LOW (ref 12.0–15.0)
LYMPHS PCT: 5 %
Lymphs Abs: 1.2 10*3/uL (ref 0.7–4.0)
MCH: 25.8 pg — AB (ref 26.0–34.0)
MCHC: 32.7 g/dL (ref 30.0–36.0)
MCV: 78.9 fL (ref 78.0–100.0)
MONO ABS: 1.5 10*3/uL — AB (ref 0.1–1.0)
Monocytes Relative: 6 %
NEUTROS ABS: 21.5 10*3/uL — AB (ref 1.7–7.7)
NEUTROS PCT: 89 %
PLATELETS: 634 10*3/uL — AB (ref 150–400)
RBC: 4.07 MIL/uL (ref 3.87–5.11)
RDW: 19.2 % — AB (ref 11.5–15.5)
WBC: 24.2 10*3/uL — ABNORMAL HIGH (ref 4.0–10.5)

## 2017-10-24 LAB — MAGNESIUM: MAGNESIUM: 1.4 mg/dL — AB (ref 1.7–2.4)

## 2017-10-24 LAB — GLUCOSE, CAPILLARY
GLUCOSE-CAPILLARY: 162 mg/dL — AB (ref 65–99)
Glucose-Capillary: 147 mg/dL — ABNORMAL HIGH (ref 65–99)
Glucose-Capillary: 156 mg/dL — ABNORMAL HIGH (ref 65–99)

## 2017-10-24 LAB — I-STAT CG4 LACTIC ACID, ED: LACTIC ACID, VENOUS: 1.66 mmol/L (ref 0.5–1.9)

## 2017-10-24 SURGERY — EXPLORATION, INGUINAL REGION
Anesthesia: General | Laterality: Left

## 2017-10-24 MED ORDER — POTASSIUM CHLORIDE IN NACL 20-0.45 MEQ/L-% IV SOLN
INTRAVENOUS | Status: DC
  Administered 2017-10-24 – 2017-10-26 (×3): via INTRAVENOUS
  Filled 2017-10-24 (×3): qty 1000

## 2017-10-24 MED ORDER — IOPAMIDOL (ISOVUE-300) INJECTION 61%
INTRAVENOUS | Status: AC
  Filled 2017-10-24: qty 100

## 2017-10-24 MED ORDER — PROPOFOL 10 MG/ML IV BOLUS
INTRAVENOUS | Status: DC | PRN
  Administered 2017-10-24: 160 mg via INTRAVENOUS

## 2017-10-24 MED ORDER — SODIUM CHLORIDE 0.9 % IV BOLUS
1000.0000 mL | Freq: Once | INTRAVENOUS | Status: AC
  Administered 2017-10-24: 1000 mL via INTRAVENOUS

## 2017-10-24 MED ORDER — LACTATED RINGERS IV SOLN
INTRAVENOUS | Status: DC | PRN
  Administered 2017-10-24: 19:00:00 via INTRAVENOUS

## 2017-10-24 MED ORDER — BISACODYL 10 MG RE SUPP: 10.0000 mg | Freq: Every day | RECTAL | Status: DC | PRN

## 2017-10-24 MED ORDER — SODIUM CHLORIDE 0.9 % IV SOLN: INTRAVENOUS | Status: DC

## 2017-10-24 MED ORDER — ACETAMINOPHEN 325 MG PO TABS
650.0000 mg | ORAL_TABLET | Freq: Four times a day (QID) | ORAL | Status: DC | PRN
  Administered 2017-10-26: 650 mg via ORAL
  Filled 2017-10-24: qty 2

## 2017-10-24 MED ORDER — SODIUM CHLORIDE 0.9 % IV SOLN
3.0000 g | Freq: Four times a day (QID) | INTRAVENOUS | Status: DC
  Administered 2017-10-25 – 2017-10-29 (×18): 3 g via INTRAVENOUS
  Filled 2017-10-24 (×19): qty 3

## 2017-10-24 MED ORDER — ACETAMINOPHEN 10 MG/ML IV SOLN
INTRAVENOUS | Status: AC
  Administered 2017-10-24: 1000 mg via INTRAVENOUS
  Filled 2017-10-24: qty 100

## 2017-10-24 MED ORDER — PHENYLEPHRINE 40 MCG/ML (10ML) SYRINGE FOR IV PUSH (FOR BLOOD PRESSURE SUPPORT)
PREFILLED_SYRINGE | INTRAVENOUS | Status: AC
  Filled 2017-10-24: qty 10

## 2017-10-24 MED ORDER — FENTANYL CITRATE (PF) 100 MCG/2ML IJ SOLN
INTRAMUSCULAR | Status: DC | PRN
  Administered 2017-10-24 (×3): 50 ug via INTRAVENOUS
  Administered 2017-10-24: 100 ug via INTRAVENOUS

## 2017-10-24 MED ORDER — CLINDAMYCIN PHOSPHATE 900 MG/50ML IV SOLN
INTRAVENOUS | Status: AC
  Filled 2017-10-24: qty 50

## 2017-10-24 MED ORDER — MAGNESIUM SULFATE 2 GM/50ML IV SOLN
2.0000 g | Freq: Once | INTRAVENOUS | Status: AC
  Administered 2017-10-28: 2 g via INTRAVENOUS
  Filled 2017-10-24: qty 50

## 2017-10-24 MED ORDER — PROPOFOL 10 MG/ML IV BOLUS
INTRAVENOUS | Status: AC
  Filled 2017-10-24: qty 20

## 2017-10-24 MED ORDER — FESOTERODINE FUMARATE ER 4 MG PO TB24
4.0000 mg | ORAL_TABLET | Freq: Every day | ORAL | Status: DC
  Administered 2017-10-25 – 2017-10-29 (×5): 4 mg via ORAL
  Filled 2017-10-24 (×5): qty 1

## 2017-10-24 MED ORDER — ONDANSETRON HCL 4 MG/2ML IJ SOLN
4.0000 mg | Freq: Four times a day (QID) | INTRAMUSCULAR | Status: DC | PRN
Start: 2017-10-24 — End: 2017-10-29

## 2017-10-24 MED ORDER — ACETAMINOPHEN 325 MG PO TABS
650.0000 mg | ORAL_TABLET | Freq: Three times a day (TID) | ORAL | Status: DC
  Administered 2017-10-24 – 2017-10-29 (×14): 650 mg via ORAL
  Filled 2017-10-24 (×14): qty 2

## 2017-10-24 MED ORDER — HEPARIN SODIUM (PORCINE) 5000 UNIT/ML IJ SOLN
5000.0000 [IU] | Freq: Three times a day (TID) | INTRAMUSCULAR | Status: DC
  Administered 2017-10-25 – 2017-10-29 (×13): 5000 [IU] via SUBCUTANEOUS
  Filled 2017-10-24 (×12): qty 1

## 2017-10-24 MED ORDER — ONDANSETRON HCL 4 MG/2ML IJ SOLN
INTRAMUSCULAR | Status: AC
  Filled 2017-10-24: qty 2

## 2017-10-24 MED ORDER — PREDNISONE 5 MG PO TABS
2.5000 mg | ORAL_TABLET | ORAL | Status: DC
  Administered 2017-10-24: 2.5 mg via ORAL
  Administered 2017-10-25: 5 mg via ORAL
  Filled 2017-10-24 (×2): qty 1

## 2017-10-24 MED ORDER — HYDROCODONE-ACETAMINOPHEN 5-325 MG PO TABS: 1.0000 | ORAL_TABLET | ORAL | Status: DC | PRN

## 2017-10-24 MED ORDER — PIPERACILLIN-TAZOBACTAM 3.375 G IVPB 30 MIN
3.3750 g | Freq: Once | INTRAVENOUS | Status: AC
  Administered 2017-10-24: 3.375 g via INTRAVENOUS

## 2017-10-24 MED ORDER — POTASSIUM CHLORIDE IN NACL 40-0.9 MEQ/L-% IV SOLN: INTRAVENOUS | Status: DC

## 2017-10-24 MED ORDER — CALCIUM CITRATE 950 (200 CA) MG PO TABS
200.0000 mg | ORAL_TABLET | Freq: Every day | ORAL | Status: DC
  Administered 2017-10-25 – 2017-10-29 (×5): 200 mg via ORAL
  Filled 2017-10-24 (×5): qty 1

## 2017-10-24 MED ORDER — VANCOMYCIN HCL IN DEXTROSE 750-5 MG/150ML-% IV SOLN
750.0000 mg | INTRAVENOUS | Status: DC
  Administered 2017-10-25 – 2017-10-28 (×4): 750 mg via INTRAVENOUS
  Filled 2017-10-24 (×5): qty 150

## 2017-10-24 MED ORDER — ONDANSETRON HCL 4 MG/2ML IJ SOLN
INTRAMUSCULAR | Status: DC | PRN
  Administered 2017-10-24: 4 mg via INTRAVENOUS

## 2017-10-24 MED ORDER — SODIUM CHLORIDE 0.9 % IV BOLUS: 1000.0000 mL | Freq: Once | INTRAVENOUS | Status: DC

## 2017-10-24 MED ORDER — INSULIN GLARGINE 100 UNIT/ML ~~LOC~~ SOLN
20.0000 [IU] | Freq: Every day | SUBCUTANEOUS | Status: DC
  Administered 2017-10-24 – 2017-10-28 (×5): 20 [IU] via SUBCUTANEOUS
  Filled 2017-10-24 (×6): qty 0.2

## 2017-10-24 MED ORDER — ONDANSETRON HCL 4 MG/2ML IJ SOLN: 4.0000 mg | Freq: Once | INTRAMUSCULAR | Status: DC | PRN

## 2017-10-24 MED ORDER — MORPHINE SULFATE (PF) 4 MG/ML IV SOLN
1.0000 mg | INTRAVENOUS | Status: DC | PRN
  Filled 2017-10-24: qty 1

## 2017-10-24 MED ORDER — LIDOCAINE 2% (20 MG/ML) 5 ML SYRINGE
INTRAMUSCULAR | Status: DC | PRN
  Administered 2017-10-24: 100 mg via INTRAVENOUS
  Administered 2017-10-24: 1 mg via INTRAVENOUS

## 2017-10-24 MED ORDER — ROCURONIUM BROMIDE 10 MG/ML (PF) SYRINGE
PREFILLED_SYRINGE | INTRAVENOUS | Status: AC
  Filled 2017-10-24: qty 5

## 2017-10-24 MED ORDER — IOPAMIDOL (ISOVUE-300) INJECTION 61%
100.0000 mL | Freq: Once | INTRAVENOUS | Status: AC | PRN
  Administered 2017-10-24: 100 mL via INTRAVENOUS

## 2017-10-24 MED ORDER — VANCOMYCIN HCL 10 G IV SOLR
1500.0000 mg | INTRAVENOUS | Status: AC
  Administered 2017-10-24: 1500 mg via INTRAVENOUS
  Filled 2017-10-24: qty 1500

## 2017-10-24 MED ORDER — POLYETHYLENE GLYCOL 3350 17 G PO PACK: 17.0000 g | PACK | Freq: Every day | ORAL | Status: DC | PRN

## 2017-10-24 MED ORDER — MIDAZOLAM HCL 2 MG/2ML IJ SOLN
INTRAMUSCULAR | Status: AC
  Filled 2017-10-24: qty 2

## 2017-10-24 MED ORDER — FENTANYL CITRATE (PF) 100 MCG/2ML IJ SOLN
25.0000 ug | INTRAMUSCULAR | Status: DC | PRN
  Administered 2017-10-25 – 2017-10-29 (×12): 25 ug via INTRAVENOUS
  Filled 2017-10-24 (×12): qty 2

## 2017-10-24 MED ORDER — PHENYLEPHRINE 40 MCG/ML (10ML) SYRINGE FOR IV PUSH (FOR BLOOD PRESSURE SUPPORT)
PREFILLED_SYRINGE | INTRAVENOUS | Status: DC | PRN
  Administered 2017-10-24 (×2): 80 ug via INTRAVENOUS
  Administered 2017-10-24: 160 ug via INTRAVENOUS

## 2017-10-24 MED ORDER — BACLOFEN 10 MG PO TABS
15.0000 mg | ORAL_TABLET | Freq: Every day | ORAL | Status: DC
  Administered 2017-10-25 – 2017-10-29 (×5): 15 mg via ORAL
  Filled 2017-10-24 (×5): qty 2

## 2017-10-24 MED ORDER — PIPERACILLIN-TAZOBACTAM 3.375 G IVPB
INTRAVENOUS | Status: AC
  Filled 2017-10-24: qty 50

## 2017-10-24 MED ORDER — INSULIN ASPART 100 UNIT/ML ~~LOC~~ SOLN
0.0000 [IU] | Freq: Three times a day (TID) | SUBCUTANEOUS | Status: DC
  Administered 2017-10-25 (×2): 2 [IU] via SUBCUTANEOUS
  Administered 2017-10-25 – 2017-10-26 (×2): 3 [IU] via SUBCUTANEOUS
  Administered 2017-10-26 – 2017-10-27 (×2): 2 [IU] via SUBCUTANEOUS
  Administered 2017-10-28 (×2): 3 [IU] via SUBCUTANEOUS
  Administered 2017-10-29: 2 [IU] via SUBCUTANEOUS

## 2017-10-24 MED ORDER — PRO-STAT SUGAR FREE PO LIQD
30.0000 mL | Freq: Two times a day (BID) | ORAL | Status: DC
  Administered 2017-10-26 – 2017-10-29 (×5): 30 mL via ORAL
  Filled 2017-10-24 (×7): qty 30

## 2017-10-24 MED ORDER — VITAMIN D3 25 MCG (1000 UNIT) PO TABS
1000.0000 [IU] | ORAL_TABLET | Freq: Every day | ORAL | Status: DC
  Administered 2017-10-25 – 2017-10-29 (×5): 1000 [IU] via ORAL
  Filled 2017-10-24 (×5): qty 1

## 2017-10-24 MED ORDER — BACLOFEN 10 MG PO TABS
10.0000 mg | ORAL_TABLET | ORAL | Status: DC
  Administered 2017-10-25 – 2017-10-28 (×4): 10 mg via ORAL
  Filled 2017-10-24 (×4): qty 1

## 2017-10-24 MED ORDER — OXYCODONE-ACETAMINOPHEN 5-325 MG PO TABS
1.0000 | ORAL_TABLET | Freq: Four times a day (QID) | ORAL | Status: DC | PRN
  Administered 2017-10-24 – 2017-10-26 (×3): 1 via ORAL
  Filled 2017-10-24: qty 2
  Filled 2017-10-24 (×2): qty 1

## 2017-10-24 MED ORDER — FENTANYL CITRATE (PF) 100 MCG/2ML IJ SOLN: 25.0000 ug | INTRAMUSCULAR | Status: DC | PRN

## 2017-10-24 MED ORDER — PANTOPRAZOLE SODIUM 40 MG PO TBEC
40.0000 mg | DELAYED_RELEASE_TABLET | Freq: Every day | ORAL | Status: DC
  Administered 2017-10-25 – 2017-10-29 (×5): 40 mg via ORAL
  Filled 2017-10-24 (×5): qty 1

## 2017-10-24 MED ORDER — ACETAMINOPHEN 650 MG RE SUPP: 650.0000 mg | Freq: Four times a day (QID) | RECTAL | Status: DC | PRN

## 2017-10-24 MED ORDER — ACETAMINOPHEN 10 MG/ML IV SOLN
1000.0000 mg | Freq: Once | INTRAVENOUS | Status: AC
  Administered 2017-10-24: 1000 mg via INTRAVENOUS

## 2017-10-24 MED ORDER — HEPARIN SODIUM (PORCINE) 5000 UNIT/ML IJ SOLN: 5000.0000 [IU] | Freq: Three times a day (TID) | INTRAMUSCULAR | Status: DC

## 2017-10-24 MED ORDER — SENNOSIDES-DOCUSATE SODIUM 8.6-50 MG PO TABS
2.0000 | ORAL_TABLET | Freq: Every day | ORAL | Status: DC
  Administered 2017-10-27 – 2017-10-28 (×2): 2 via ORAL
  Filled 2017-10-24 (×2): qty 2

## 2017-10-24 MED ORDER — POTASSIUM CHLORIDE 10 MEQ/100ML IV SOLN
10.0000 meq | INTRAVENOUS | Status: AC
  Administered 2017-10-24: 10 meq via INTRAVENOUS
  Filled 2017-10-24: qty 100

## 2017-10-24 MED ORDER — ONDANSETRON HCL 4 MG PO TABS: 4.0000 mg | ORAL_TABLET | Freq: Four times a day (QID) | ORAL | Status: DC | PRN

## 2017-10-24 MED ORDER — FENTANYL CITRATE (PF) 250 MCG/5ML IJ SOLN
INTRAMUSCULAR | Status: AC
  Filled 2017-10-24: qty 5

## 2017-10-24 MED ORDER — DULOXETINE HCL 30 MG PO CPEP
90.0000 mg | ORAL_CAPSULE | Freq: Every day | ORAL | Status: DC
  Administered 2017-10-25 – 2017-10-29 (×5): 90 mg via ORAL
  Filled 2017-10-24 (×5): qty 3

## 2017-10-24 MED ORDER — LIDOCAINE 2% (20 MG/ML) 5 ML SYRINGE
INTRAMUSCULAR | Status: AC
  Filled 2017-10-24: qty 5

## 2017-10-24 MED ORDER — CLINDAMYCIN PHOSPHATE 600 MG/50ML IV SOLN: 600.0000 mg | Freq: Once | INTRAVENOUS | Status: DC

## 2017-10-24 MED ORDER — GABAPENTIN 300 MG PO CAPS
300.0000 mg | ORAL_CAPSULE | Freq: Two times a day (BID) | ORAL | Status: DC
  Administered 2017-10-24 – 2017-10-29 (×10): 300 mg via ORAL
  Filled 2017-10-24 (×10): qty 1

## 2017-10-24 MED ORDER — FENTANYL CITRATE (PF) 100 MCG/2ML IJ SOLN
50.0000 ug | Freq: Once | INTRAMUSCULAR | Status: AC
  Administered 2017-10-24: 50 ug via INTRAVENOUS
  Filled 2017-10-24: qty 2

## 2017-10-24 MED ORDER — INSULIN ASPART 100 UNIT/ML ~~LOC~~ SOLN: 0.0000 [IU] | Freq: Every day | SUBCUTANEOUS | Status: DC

## 2017-10-24 MED ORDER — BACLOFEN 20 MG PO TABS
20.0000 mg | ORAL_TABLET | Freq: Every day | ORAL | Status: DC
  Administered 2017-10-24 – 2017-10-28 (×5): 20 mg via ORAL
  Filled 2017-10-24 (×5): qty 1

## 2017-10-24 SURGICAL SUPPLY — 7 items
DRAPE SHEET LG 3/4 BI-LAMINATE (DRAPES) ×8 IMPLANT
GLOVE BIOGEL M STRL SZ7.5 (GLOVE) ×12 IMPLANT
GOWN STRL REUS W/ TWL XL LVL3 (GOWN DISPOSABLE) IMPLANT
GOWN STRL REUS W/TWL XL LVL3 (GOWN DISPOSABLE) ×9
KIT BASIN OR (CUSTOM PROCEDURE TRAY) ×2 IMPLANT
PACK GENERAL/GYN (CUSTOM PROCEDURE TRAY) ×2 IMPLANT
TOWEL NATURAL 10PK STERILE (DISPOSABLE) ×2 IMPLANT

## 2017-10-24 NOTE — ED Notes (Signed)
Bed: TM22 Expected date: 10/24/17 Expected time:  Means of arrival:  Comments: EMS

## 2017-10-24 NOTE — Op Note (Signed)
10/24/2017  7:22 PM  PATIENT:  Laura Roberts, 77 y.o., female, MRN: [id number]  PREOP DIAGNOSIS:  Left thigh ABCESS  POSTOP DIAGNOSIS:   Left thigh abscess  PROCEDURE:   Procedure(s): IRRIGATION AND DEBRIDEMENT, left thigh abscess   SURGEON:   Alphonsa Overall, M.D.  ASSISTANT:   None  ANESTHESIA:   general  Anesthesiologist: Catalina Gravel, MD CRNA: Marijo Conception, CRNA  General  EBL:  75  ml  BLOOD ADMINISTERED: none  DRAINS: Wound packed with Curlex  LOCAL MEDICATIONS USED:   None  SPECIMEN:   Cultures for aerobes and anaerobes  COUNTS CORRECT:  YES  INDICATIONS FOR PROCEDURE:  [name] is a 77 y.o. (DOB: 17-Jan-1941) AA female whose primary care physician is [name], MD.  She presented to the [facility] with a left groin/thigh abscess.   The indications and risks of the surgery were explained to the patient.  The risks include, but are not limited to, infection, bleeding, and nerve injury.  PROCEDURE:  The patient was taken to operating room #1 at [facility].  She was placed in a supine position with her legs frog legged. She was given a general anesthesia.   A timeout was held and the surgical checklist run.   She has an abscess in the left upper medial thigh that starts at her inguinal crease and goes about 15 cm down from the inguinal crease.   I made an incision in the left upper inner thigh and one third down her left upper thigh. I entered a deep abscess and drained over 100 mL of pus.  This abscess tracked along an area of the adductor longus.  I obtained aerobic and anaerobic cultures.   I irrigated the wound with 1 L of saline.  I packed the wound with saline Curlex and covered it with gauze.  Sponge and needle count were correct at the end of the case.   She tolerated the procedure well and was transferred to recovery room in good condition.  Whether she'll need to return to the operating room will depend on how these  wounds due to.  Alphonsa Overall, MD, [facility] Surgery Pager: [phone] Office phone:  [phone]

## 2017-10-24 NOTE — ED Notes (Signed)
Dr. Newman at bedside. 

## 2017-10-24 NOTE — Anesthesia Preprocedure Evaluation (Addendum)
Anesthesia Evaluation  Patient identified by MRN, date of birth, ID band Patient awake    Reviewed: Allergy & Precautions, NPO status , Patient's Chart, lab work & pertinent test results  Airway Mallampati: II  TM Distance: >3 FB Neck ROM: Full    Dental  (+) Dental Advisory Given, Partial Upper   Pulmonary neg pulmonary ROS,    Pulmonary exam normal breath sounds clear to auscultation       Cardiovascular hypertension, Pt. on medications Normal cardiovascular exam Rhythm:Regular Rate:Normal     Neuro/Psych Multiple Sclerosis   Neuromuscular disease    GI/Hepatic Neg liver ROS, GERD  Medicated,  Endo/Other  diabetes, Type 2, Insulin DependentObesity   Renal/GU negative Renal ROS     Musculoskeletal  (+) Arthritis ,   Abdominal   Peds  Hematology  (+) Blood dyscrasia, anemia ,   Anesthesia Other Findings Day of surgery medications reviewed with the patient.  Left GROIN ABCESS  Reproductive/Obstetrics                            Anesthesia Physical Anesthesia Plan  ASA: III and emergent  Anesthesia Plan: General   Post-op Pain Management:    Induction: Intravenous  PONV Risk Score and Plan: 3 and Dexamethasone, Ondansetron and Treatment may vary due to age or medical condition  Airway Management Planned: Oral ETT  Additional Equipment:   Intra-op Plan:   Post-operative Plan: Extubation in OR  Informed Consent: I have reviewed the patients History and Physical, chart, labs and discussed the procedure including the risks, benefits and alternatives for the proposed anesthesia with the patient or authorized representative who has indicated his/her understanding and acceptance.   Dental advisory given  Plan Discussed with: CRNA  Anesthesia Plan Comments: (Currently DNR: patient declines CPR, defibrillation, any heroic life saving measures.  OK with intubation post-operatively  if only for short period of time.)       Anesthesia Quick Evaluation

## 2017-10-24 NOTE — Anesthesia Procedure Notes (Signed)
Procedure Name: Intubation Date/Time: 10/24/2017 6:55 PM Performed by: Brayten Komar, Clinical cytogeneticist D, CRNA Pre-anesthesia Checklist: Patient identified, Emergency Drugs available, Suction available and Patient being monitored Patient Re-evaluated:Patient Re-evaluated prior to induction Oxygen Delivery Method: Circle system utilized Preoxygenation: Pre-oxygenation with 100% oxygen Induction Type: IV induction Ventilation: Mask ventilation without difficulty Laryngoscope Size: Mac and 3 Grade View: Grade I Tube type: Oral Number of attempts: 1 Airway Equipment and Method: Stylet and Oral airway Placement Confirmation: ETT inserted through vocal cords under direct vision,  positive ETCO2 and breath sounds checked- equal and bilateral Secured at: 21 cm Tube secured with: Tape Dental Injury: Teeth and Oropharynx as per pre-operative assessment

## 2017-10-24 NOTE — ED Triage Notes (Signed)
Per [facility] pt coming from [facility] c/o abscess to left leg onset of this am. Pt paraplegic and has suprapubic catheter.

## 2017-10-24 NOTE — ED Provider Notes (Signed)
[facility] DEPT Provider Note   CSN: [id number] Arrival date & time: 10/24/17  1346     History   Chief Complaint Chief Complaint  Patient presents with  . Abscess  . Leg Pain    HPI Laura Roberts is a 77 y.o. female with a PMHx of DM2, MS, spinal stenosis, paraplegia, HTN, HLD, and other conditions listed below, who presents to the ED with complaints of left inguinal abscess that she noticed today.  She states the area is very painful, describing the pain as 10/10 constant aching nonradiating pain in the left groin, no known aggravating factors, and minimally improved with Tylenol.  She reports warmth, erythema, and swelling to the area.  She denies any drainage that she is aware of although she cannot visualize the area well due to her baseline paraplegia, but states that her nursing staff at the facility she lives at did not report any drainage.  She denies fevers, chills, CP, SOB, abd pain, N/V/D/C, hematuria, dysuria, other arthralgias, new/worsening numbness/tingling, new/worsening focal weakness, or any other complaints at this time. Of note, she states her heart rate is always high; doesn't take any meds for this issue but it's not a new issue.   The history is provided by the patient and medical records. No language interpreter was used.  Abscess  Associated symptoms: no fever, no nausea and no vomiting   Leg Pain   Pertinent negatives include no numbness.    Past Medical History:  Diagnosis Date  . Degenerative arthritis   . Diabetes mellitus without complication ([place])   . Gait disturbance   . Hypertension   . Multiple sclerosis ([place])   . Neuromuscular disorder (HCC)    Bilateral hand carpal tunnel syndrome  . Obesity   . Spinal stenosis in cervical region   . Spinal stenosis of lumbosacral region   . Spinal stenosis, thoracic     Patient Active Problem List   Diagnosis Date Noted  . Hypokalemia   . Diabetes mellitus type 2,  insulin dependent ([place])   . Hypomagnesemia   . UTI (urinary tract infection) 10/09/2017  . Complicated UTI (urinary tract infection) 10/09/2017  . Febrile illness   . UTI (urinary tract infection) due to urinary indwelling Foley catheter ([place]) 08/13/2017  . Decubitus ulcer of buttock, stage 4 ([place]) 11/22/2016  . Sepsis ([place]) 11/22/2016  . Leukocytosis   . Paraplegia ([place]) 06/01/2014  . Closed supracondylar fracture of right femur, periprosthetic 06/01/2014  . Femoral fracture ([place]) 06/01/2014  . Encounter for therapeutic drug monitoring 01/05/2014  . Foot drop, bilateral 10/13/2012  . Abnormality of gait 10/13/2012  . Neurogenic bladder 09/02/2012  . Neurogenic pain of lower extremity 09/02/2012  . Weakness 07/21/2012  . HTN (hypertension) 07/21/2012  . DM (diabetes mellitus) ([place]) 07/21/2012  . HLD (hyperlipidemia) 07/21/2012  . Tachycardia 07/21/2012  . GERD (gastroesophageal reflux disease) 07/21/2012  . Multiple sclerosis ([place])     Past Surgical History:  Procedure Laterality Date  . ABDOMINAL HYSTERECTOMY    . BACK SURGERY    . CATARACT EXTRACTION Right   . CHOLECYSTECTOMY    . FEMUR IM NAIL Right 06/02/2014   Procedure: INTRAMEDULLARY (IM) RETROGRADE FEMORAL NAILING;  Surgeon: Johnny Bridge, MD;  Location: [facility];  Service: Orthopedics;  Laterality: Right;  . IR CATHETER TUBE CHANGE  10/13/2017  . left hand carpal tunnel surgery    . REPLACEMENT TOTAL KNEE BILATERAL  2004     OB History  None      Home Medications    Prior to Admission medications   Medication Sig Start Date End Date Taking? Authorizing Provider  acetaminophen (TYLENOL) 325 MG tablet Take 650 mg by mouth 3 (three) times daily.     [provider]  Amino Acids-Protein Hydrolys (FEEDING SUPPLEMENT, PRO-STAT SUGAR FREE 64,) LIQD Take 30 mLs by mouth 2 (two) times daily. 11/26/16   Minus Liberty, MD  amLODipine (NORVASC) 10 MG tablet Take 10 mg by mouth at bedtime.     [provider]  ARTIFICIAL TEAR OP Apply 1 drop to eye 4 (four) times daily as needed (dry eyes).     [provider]  baclofen (LIORESAL) 10 MG tablet 1.5 tablet in the morning, 1 tablet midday, two tablets at night Patient taking differently: Take 10-20 mg by mouth See admin instructions. 15 mg  tablet in the morning, 10 mg  tablet midday, 20 mg tablets at night 11/29/15   Ward Givens, NP  calcium citrate (CALCITRATE - DOSED IN MG ELEMENTAL CALCIUM) 950 MG tablet Take 200 mg of elemental calcium by mouth daily.    [provider]  cholecalciferol (VITAMIN D) 1000 UNITS tablet Take 1,000 Units by mouth daily.    [provider]  Dimethyl Fumarate (TECFIDERA) 240 MG CPDR Take 1 capsule (240 mg total) by mouth 2 (two) times daily. 03/13/16   Kathrynn Ducking, MD  DULoxetine (CYMBALTA) 30 MG capsule Take 90 mg by mouth daily.     [provider]  gabapentin (NEURONTIN) 300 MG capsule Take 1 capsule (300 mg total) by mouth 2 (two) times daily. 08/18/17   Colbert Ewing, MD  insulin glargine (LANTUS) 100 UNIT/ML injection Inject 0.2 mLs (20 Units total) into the skin at bedtime. 10/14/17   Katherine Roan, MD  Lidocaine (ASPERCREME LIDOCAINE) 4 % PTCH Apply 1 patch topically daily as needed (To painful areas).    [provider]  omeprazole (PRILOSEC) 20 MG capsule Take 20 mg by mouth daily.    [provider]  Petrolatum-Zinc Oxide (SENSI-CARE PROTECTIVE BARRIER) 49-15 % OINT Apply 1 application topically 2 (two) times daily. Apply to buttocks and inner thighs    [provider]  polyethylene glycol (MIRALAX / GLYCOLAX) packet Take 17 g by mouth daily as needed for mild constipation.     [provider]  predniSONE (DELTASONE) 5 MG tablet 1 tablet on odd days, one half tablet on even days Patient taking differently: Take 2.5-5 mg by mouth See admin instructions. 1 tablet (5mg ) on odd days, one half (2.5mg ) tablet on even days  11/22/14   Kathrynn Ducking, MD  senna-docusate (SENOKOT-S) 8.6-50 MG tablet Take 2 tablets by mouth at bedtime.    [provider]  Skin Protectants, Misc. (EUCERIN) cream Apply 1 application topically 2 (two) times daily.    [provider]  tolterodine (DETROL LA) 4 MG 24 hr capsule Take 4 mg by mouth daily.    [provider]    Family History Family History  Problem Relation Age of Onset  . Multiple sclerosis Other        neices.   . Cancer Mother   . Diabetes Mother   . GI Bleed Sister        diverticulitis    Social History Social History   Tobacco Use  . Smoking status: Never Smoker  . Smokeless tobacco: Never Used  Substance Use Topics  . Alcohol use: No  .  Drug use: No     Allergies   Codeine; Ultram [tramadol]; and Januvia [sitagliptin]   Review of Systems Review of Systems  Constitutional: Negative for chills and fever.  Respiratory: Negative for shortness of breath.   Cardiovascular: Negative for chest pain.  Gastrointestinal: Negative for abdominal pain, constipation, diarrhea, nausea and vomiting.  Genitourinary: Negative for dysuria and hematuria.  Musculoskeletal: Positive for myalgias (around the L groin near the abscess). Negative for arthralgias.  Skin: Positive for wound (abscess).  Allergic/Immunologic: Positive for immunocompromised state (DM2).  Neurological: Negative for weakness and numbness.  Psychiatric/Behavioral: Negative for confusion.   All other systems reviewed and are negative for acute change except as noted in the HPI.    Physical Exam Updated Vital Signs BP 105/64 (BP Location: Left Arm)   Pulse (!) 114   Temp 98.2 F (36.8 C) (Oral)   Resp 15   Ht 5\' 2"  (1.575 m)   Wt 83.9 kg (185 lb)   SpO2 94%   BMI 33.84 kg/m   Physical Exam  Constitutional: She is oriented to person, place, and time. Vital signs are normal. She appears well-developed and well-nourished.  Non-toxic appearance. No  distress.  Afebrile, nontoxic, NAD  HENT:  Head: Normocephalic and atraumatic.  Mouth/Throat: Oropharynx is clear and moist and mucous membranes are normal.  Eyes: Conjunctivae and EOM are normal. Right eye exhibits no discharge. Left eye exhibits no discharge.  Neck: Normal range of motion. Neck supple.  Cardiovascular: Regular rhythm, normal heart sounds and intact distal pulses. Tachycardia present. Exam reveals no gallop and no friction rub.  No murmur heard. Tachycardic in the 110s, reg rhythm, nl s1/s2, no m/r/g, distal pulses intact  Pulmonary/Chest: Effort normal and breath sounds normal. No respiratory distress. She has no decreased breath sounds. She has no wheezes. She has no rhonchi. She has no rales.  Abdominal: Soft. Normal appearance and bowel sounds are normal. She exhibits no distension. There is no tenderness. There is no rigidity, no rebound, no guarding, no CVA tenderness, no tenderness at McBurney's point and negative Murphy's sign.  Musculoskeletal:  Baseline ROM in upper extremities bilaterally, unable to move her legs at baseline.  Neurological: She is alert and oriented to person, place, and time. She has normal strength. No sensory deficit.  Skin: Skin is warm, dry and intact. No rash noted. There is erythema.  Exam limited due to body habitus and paraplegia. Moderately sized area of induration to the L inguinal fold area and into the upper medial thigh, doesn't appear to extend into the vaginal tissues/labia but difficult to appreciate due to exam limitations. Unable to see the posterior aspect of the abscess but feels like it stops about midway back on her thigh. Small area of fluctuance to the lateral edge anteriorly, possible purulent drainage from somewhere but unable to visualize where it's coming from. Very TTP. +warmth, +erythema. No red streaking.   Psychiatric: She has a normal mood and affect.  Nursing note and vitals reviewed.    ED Treatments / Results    Labs (all labs ordered are listed, but only abnormal results are displayed) Labs Reviewed  CBC WITH DIFFERENTIAL/PLATELET - Abnormal; Notable for the following components:      Result Value   WBC 24.2 (*)    Hemoglobin 10.5 (*)    HCT 32.1 (*)    MCH 25.8 (*)    RDW 19.2 (*)    Platelets 634 (*)    Neutro Abs 21.5 (*)  Monocytes Absolute 1.5 (*)    All other components within normal limits  BASIC METABOLIC PANEL - Abnormal; Notable for the following components:   Sodium 134 (*)    Potassium 2.7 (*)    Chloride 88 (*)    Glucose, Bld 173 (*)    Anion gap 17 (*)    All other components within normal limits  MAGNESIUM - Abnormal; Notable for the following components:   Magnesium 1.4 (*)    All other components within normal limits  I-STAT CG4 LACTIC ACID, ED    EKG EKG Interpretation  Date/Time:  Friday October 24 2017 16:57:49 EDT Ventricular Rate:  127 PR Interval:    QRS Duration: 97 QT Interval:  448 QTC Calculation: 652 R Axis:   -17 Text Interpretation:  Sinus tachycardia Borderline left axis deviation Anterior infarct, old Borderline ST depression, lateral leads Prolonged QT interval Baseline wander in lead(s) V2 V3 No significant change since last tracing Confirmed by Orlie Dakin [phone]) on 10/24/2017 5:01:28 PM   Radiology Ct Pelvis W Contrast  Result Date: 10/24/2017 CLINICAL DATA:  LEFT groin abscess into LEFT thigh EXAM: CT PELVIS WITH CONTRAST TECHNIQUE: Multidetector CT imaging of the pelvis was performed using the standard protocol following the bolus administration of intravenous contrast. Sagittal and coronal MPR images reconstructed from axial data set. CONTRAST:  133mL ISOVUE-300 IOPAMIDOL (ISOVUE-300) INJECTION 61% IV COMPARISON:  CT pelvis 09/09/2017 FINDINGS: Urinary Tract: Suprapubic catheter. Inferior pole RIGHT kidney normal appearance. Ureters and decompressed bladder otherwise unremarkable. Bowel: Prominent stool in rectum. Remaining visualized  pelvic bowel loops unremarkable. Vascular/Lymphatic: Scattered atherosclerotic calcifications throughout the iliac systems and at distal aorta. Few scattered pelvic phleboliths. Reproductive: Uterus surgically absent. Normal sized ovaries bilaterally. Other: Small umbilical hernia containing fat. Irregular fluid collection with enhancing margins and septations identified in the medial proximal LEFT thigh, extending from anterior to the LEFT pubic body inferiorly to the level of the proximal to mid LEFT femoral diaphysis, collection overall measuring 9.0 x 5.8 x >11.1 cm; inferior extent is not imaged on this exam. Finding is consistent with an abscess. The collection with surrounding inflammation extends to abut the anterior aspect of the LEFT pubic body. No intrapelvic extension. Tissue planes surrounding the bladder and rectum appear clear. Musculoskeletal: Diffuse osseous demineralization. Diffuse muscular atrophy. No bone destruction at the pubic body to suggest osteomyelitis though the inflammation extends to the anterior margin. Hip joints preserved. SI joints symmetric. IM nail proximal RIGHT femur. Calcification identified at the RIGHT ileo psoas muscle at the musculotendinous junction question sequela of prior trauma. Additionally, subcutaneous infiltration with minimal gas noted at the posterior inferior RIGHT buttock, extending towards the RIGHT ischium compatible with a decubitus ulcer; no drainable fluid collection seen. IMPRESSION: Multiloculated fluid collection with irregular enhancing margins and septations identified at the medial aspect of the upper LEFT thigh, extending from the LEFT pubic body to the level of the proximal to mid LEFT femoral diaphysis, 9.0 x 5.8 x 11.1 cm consistent with abscess. Inferior extent is not imaged; if further imaging is required distally, may consider MR or CT. Prominent stool in rectum. Small umbilical hernia containing fat. No intrapelvic abnormalities or  intrapelvic extension of inflammatory process seen. Inferior RIGHT gluteal decubitus ulcer. Electronically Signed   By: Lavonia Dana M.D.   On: 10/24/2017 16:45    Procedures Procedures (including critical care time)  Medications Ordered in ED Medications  potassium chloride 10 mEq in 100 mL IVPB (has no administration in time range)  magnesium sulfate IVPB 2 g 50 mL (has no administration in time range)  fentaNYL (SUBLIMAZE) injection 50 mcg (50 mcg Intravenous Given 10/24/17 1516)  iopamidol (ISOVUE-300) 61 % injection 100 mL (100 mLs Intravenous Contrast Given 10/24/17 1617)     Initial Impression / Assessment and Plan / ED Course  I have reviewed the triage vital signs and the nursing notes.  Pertinent labs & imaging results that were available during my care of the patient were reviewed by me and considered in my medical decision making (see chart for details).     77 y.o. female here with abscess to L groin area, states it started today but it looks like it's probably been developing for at least a few days. Exam is difficult due to pt being paraplegic and due to body habitus, but there is a moderately sized area of induration to the L groin which doesn't appear to extend into the vaginal area but abuts it fairly closely, some fluctuance to the edge of it, possibly draining but I can't see from where; +warmth, +erythema, +tenderness. No red streaking. Pt is also tachycardic but this is a persistent issue throughout all of her visits, not a new problem (interestingly she's not on any meds for this). Otherwise afebrile and nontoxic with stable vitals. Will get labs and CT pelvis to evaluate the extent of this abscess, it will likely need surgical intervention. Discussed case with my attending Dr. Lita Mains who agrees with plan.   4:07 PM CBC w/diff pending. BMP with K 2.7, will add-on Mg level and get EKG, will give IV potassium to avoid giving PO meds for now in case she needs surgical  intervention; also has mildly low Na 134 Cl 88 mildly elevated gluc 173 but bicarb WNL, anion gap slightly elevated at 17 likely from her mild electrolyte abnormalities. Lactic WNL. CT pelvis not yet done. Will continue to monitor and reassess shortly.   4:55 PM CBC w/diff with significant neutrophilic leukocytosis WBC 24.2, plt 634 likely acute phase reactant; also with mild stable anemia. Mg low at 1.4, will give Mg IV since we're avoiding PO meds now. EKG not yet done, asked that nursing staff get this done now. CT pelvis confirms multiloculated fluid collection with irregular enhancing margins and septations in the L upper thigh extending from L pubic body to proximal mid femoral diaphysis, measuring 9.0x5.8x11.1 cm. Will consult surgery team and probably admit.   5:14 PM EKG without concerning findings. Dr. Lucia Gaskins of general surgery returning page and would like her to remain NPO, will likely operate on her tonight if possible; requesting medical admission, will consult medical service for this.   5:26 PM Dr. Alfredia Ferguson of [facility] returning page and will admit here since she has already had surgical consult here; will let internal medicine know since technically she was just admitted by them. Holding orders to be placed by admitting team. Please see their notes for further documentation of care. I GREATLY appreciate their help with this pleasant pt's care. Pt stable at time of admission.    Final Clinical Impressions(s) / ED Diagnoses   Final diagnoses:  Inguinal abscess  Cellulitis of groin  Neutrophilic leukocytosis  Hypokalemia  Hypomagnesemia  Thrombocytosis [facility])  Chronic anemia    ED Discharge Orders    [address], [place], [place] 10/24/17 1727    Julianne Rice, MD 11/01/17 2132

## 2017-10-24 NOTE — ED Notes (Addendum)
CRITICAL VALUE STICKER  CRITICAL VALUE: K+ 2.7  RECEIVER (on-site recipient of call): Jake T RN  DATE & TIME NOTIFIED: 10/24/17 1552  MESSENGER (representative from lab): K, Tibbitts  MD NOTIFIED: PA Mercedes and RN are aware  TIME OF NOTIFICATION: 5183  RESPONSE: See orders placed at 1553 and 1606

## 2017-10-24 NOTE — ED Notes (Signed)
ED Provider at bedside. 

## 2017-10-24 NOTE — Transfer of Care (Signed)
Immediate Anesthesia Transfer of Care Note  Patient: Laura Roberts  Procedure(s) Performed: IRRIGATION AND DEBRIDEMENT, LEFT THIGH ABCESS  (Left )  Patient Location: PACU  Anesthesia Type:General  Level of Consciousness: awake, alert  and oriented  Airway & Oxygen Therapy: Patient Spontanous Breathing and Patient connected to face mask oxygen  Post-op Assessment: Report given to RN and Post -op Vital signs reviewed and stable  Post vital signs: Reviewed and stable  Last Vitals:  Vitals Value Taken Time  BP 98/56 10/24/2017  7:45 PM  Temp    Pulse 127 10/24/2017  7:46 PM  Resp 17 10/24/2017  7:46 PM  SpO2 98 % 10/24/2017  7:46 PM  Vitals shown include unvalidated device data.  Last Pain:  Vitals:   10/24/17 1546  TempSrc:   PainSc: Asleep         Complications: No apparent anesthesia complications

## 2017-10-24 NOTE — ED Notes (Signed)
Hospitalist at bedside 

## 2017-10-24 NOTE — Progress Notes (Signed)
Unable to document IV 20 gauge right anterior forearm as too many LDAs charted. Good blood return. Hematoma at entry site.

## 2017-10-24 NOTE — Progress Notes (Signed)
Pharmacy Antibiotic Note  Laura Roberts is a 77 y.o. paraplegic female presented to the ED from nursing facility  on 10/24/2017 with left inguinal abscess.  Patient went for I&D on 4/26. To start vancomycin and unasyn for infection post-op  - scr 0.55, paraplegic   Plan: - of note, zosyn 3.375 gm x1 given post-op at 1857 - unasyn 3gm IV q6h (give first dose at 0100 on 4/27) - vancomycin 1500 mg IV x1 load, then 750 mg IV 24h for est AUC 486 (goal 400-500) - monitor renal function - f/u cultures  ____________________________________  Height: 5\' 2"  (157.5 cm) Weight: 185 lb (83.9 kg) IBW/kg (Calculated) : 50.1  Temp (24hrs), Avg:98.2 F (36.8 C), Min:98.2 F (36.8 C), Max:98.2 F (36.8 C)  Recent Labs  Lab 10/24/17 1509 10/24/17 1515  WBC 24.2*  --   CREATININE 0.55  --   LATICACIDVEN  --  1.66    Estimated Creatinine Clearance: 61.9 mL/min (by C-G formula based on SCr of 0.55 mg/dL).    Allergies  Allergen Reactions  . Codeine Other (See Comments)    Difficult breathing and skin problem  . Ultram [Tramadol] Other (See Comments)    Difficult breathing and skin peeling  . Januvia [Sitagliptin] Rash    Blisters    Thank you for allowing pharmacy to be a part of this patient's care.  Lynelle Doctor 10/24/2017 6:02 PM

## 2017-10-24 NOTE — H&P (Signed)
History and Physical    [name]:[id number] DOB: [birth year] DOA: 10/24/2017  PCP: [name], MD   Patient coming from: Home  Chief Complaint: Left Thigh Pain  HPI: Laura Roberts is a 77 y.o. female with medical history significant of multiple sclerosis, paraplegia, diabetes mellitus type 2 insulin-dependent, hypertension, spinal stenosis, neurogenic bladder, bilateral foot drop, history of complicated UTIs, history of sacral decubitus ulcer, as well as other comorbidities  who presented to worsening of emergency room with a chief complaint of proximal thigh leg pain.  She states that she woke up this morning described as very painful and states he was a 10 out of 10 is constant nonradiating and stated there is aching pain.  She stated it felt warm and swollen to the touch and it was near her vaginal area.  Says she is not able to fully see it because of her paraplegia and has very little to no feeling from the waist down.  She denies any shortness of breath,  nausea, vomiting, lightheadedness, dizziness, or chest pain.  States her muscles were aching.  [facility] was called to admit for an extensive inguinal abscess with erythema  ED Course: Given pain control with fentanyl, her CT of the abdomen pelvis, as well as given 1 L normal saline.  Review of Systems: As per HPI otherwise 10 point review of systems negative.   Past Medical History:  Diagnosis Date  . Degenerative arthritis   . Diabetes mellitus without complication ([place])   . Gait disturbance   . Hypertension   . Multiple sclerosis ([place])   . Neuromuscular disorder ([facility])    Bilateral hand carpal tunnel syndrome  . Obesity   . Spinal stenosis in cervical region   . Spinal stenosis of lumbosacral region   . Spinal stenosis, thoracic     Past Surgical History:  Procedure Laterality Date  . ABDOMINAL HYSTERECTOMY    . BACK SURGERY    . CATARACT EXTRACTION Right   . CHOLECYSTECTOMY    . FEMUR IM NAIL Right  06/02/2014   Procedure: INTRAMEDULLARY (IM) RETROGRADE FEMORAL NAILING;  Surgeon: Johnny Bridge, MD;  Location: [facility];  Service: Orthopedics;  Laterality: Right;  . IR CATHETER TUBE CHANGE  10/13/2017  . left hand carpal tunnel surgery    . REPLACEMENT TOTAL KNEE BILATERAL  2004   SOCIAL HISTORY  reports that she has never smoked. She has never used smokeless tobacco. She reports that she does not drink alcohol or use drugs.  Allergies  Allergen Reactions  . Codeine Other (See Comments)    Difficult breathing and skin problem  . Ultram [Tramadol] Other (See Comments)    Difficult breathing and skin peeling  . Januvia [Sitagliptin] Rash    Blisters   Family History  Problem Relation Age of Onset  . Multiple sclerosis Other        neices.   . Cancer Mother   . Diabetes Mother   . GI Bleed Sister        diverticulitis   Prior to Admission medications   Medication Sig Start Date End Date Taking? Authorizing Provider  acetaminophen (TYLENOL) 325 MG tablet Take 650 mg by mouth 3 (three) times daily.    Yes [provider]  Amino Acids-Protein Hydrolys (FEEDING SUPPLEMENT, PRO-STAT SUGAR FREE 64,) LIQD Take 30 mLs by mouth 2 (two) times daily. 11/26/16  Yes Minus Liberty, MD  amLODipine (NORVASC) 10 MG tablet Take 10 mg by mouth at bedtime.  Yes [provider]  ARTIFICIAL TEAR OP Apply 1 drop to eye 4 (four) times daily as needed (dry eyes).    Yes [provider]  baclofen (LIORESAL) 10 MG tablet 1.5 tablet in the morning, 1 tablet midday, two tablets at night Patient taking differently: Take 10-20 mg by mouth daily. Take 15 mg in the am,, 10 mg at midday, 20 mg at bedtime. 11/29/15  Yes Ward Givens, NP  bisacodyl (DULCOLAX) 10 MG suppository Place 10 mg rectally daily as needed for moderate constipation.   Yes [provider]  calcium citrate (CALCITRATE - DOSED IN MG ELEMENTAL CALCIUM) 950 MG tablet Take 200 mg of elemental calcium by  mouth daily.   Yes [provider]  cholecalciferol (VITAMIN D) 1000 UNITS tablet Take 1,000 Units by mouth daily.   Yes [provider]  Dimethyl Fumarate (TECFIDERA) 240 MG CPDR Take 1 capsule (240 mg total) by mouth 2 (two) times daily. 03/13/16  Yes Kathrynn Ducking, MD  DULoxetine (CYMBALTA) 30 MG capsule Take 90 mg by mouth daily.    Yes [provider]  gabapentin (NEURONTIN) 300 MG capsule Take 1 capsule (300 mg total) by mouth 2 (two) times daily. 08/18/17  Yes Colbert Ewing, MD  insulin glargine (LANTUS) 100 UNIT/ML injection Inject 0.2 mLs (20 Units total) into the skin at bedtime. Patient taking differently: Inject 25 Units into the skin at bedtime.  10/14/17  Yes Katherine Roan, MD  Lidocaine (ASPERCREME LIDOCAINE) 4 % PTCH Apply 1 patch topically daily as needed (To painful areas).   Yes [provider]  magnesium hydroxide (MILK OF MAGNESIA) 400 MG/5ML suspension Take 30 mLs by mouth daily as needed for mild constipation.   Yes [provider]  pantoprazole (PROTONIX) 40 MG tablet Take 40 mg by mouth daily.   Yes [provider]  polyethylene glycol (MIRALAX / GLYCOLAX) packet Take 17 g by mouth daily as needed for mild constipation.    Yes [provider]  predniSONE (DELTASONE) 5 MG tablet 1 tablet on odd days, one half tablet on even days Patient taking differently: Take 2.5-5 mg by mouth See admin instructions. 1 tablet (5mg ) on odd days, one half (2.5mg ) tablet on even days 11/22/14  Yes Kathrynn Ducking, MD  senna-docusate (SENOKOT-S) 8.6-50 MG tablet Take 2 tablets by mouth at bedtime.   Yes [provider]  Skin Protectants, Misc. (EUCERIN) cream Apply 1 application topically 2 (two) times daily.   Yes [provider]  tolterodine (DETROL LA) 4 MG 24 hr capsule Take 4 mg by mouth daily.   Yes [provider]    Physical Exam: Vitals:   10/24/17 1357 10/24/17 1516 10/24/17 1700    BP: 105/64 122/66 124/66  Pulse: (!) 114 (!) 122 (!) 128  Resp: 15 19 (!) 21  Temp: 98.2 F (36.8 C)    TempSrc: Oral    SpO2: 94% 100% 97%  Weight: 83.9 kg (185 lb)    Height: 5\' 2"  (1.575 m)     Constitutional: WN/WD obese AAF in significant pain appearing uncomfortable Eyes: Lids and conjunctivae normal, sclerae anicteric  ENMT: External Ears, Nose appear normal. Grossly normal hearing. Mucous membranes are moist.  Neck: Appears normal, supple, no cervical masses, normal ROM, no appreciable thyromegaly; no JVD Respiratory: Diminished to auscultation bilaterally, no wheezing, rales, rhonchi or crackles. Normal respiratory effort and patient is not tachypenic. No accessory muscle use.  Cardiovascular: Tachycardic Rate but regular rhythm, no murmurs /  rubs / gallops. S1 and S2 auscultated. No extremity edema Abdomen: Soft, non-tender, Distended due to body habitus. No masses palpated. No appreciable hepatosplenomegaly. Bowel sounds positive.  GU: Has a large Inguinal Abscess on the Left leading to proximal Thigh. Has an indwelling suprapubic catheter.  Musculoskeletal: Paraplegic with neurogenic bladder and bilateral foot drop  Skin: Large indurated abscess medial and proximal thigh and inguinal area with warm and pain on palpation. Unable to assess Sacral Decubitus due to Pain  Neurologic: CN 2-12 grossly intact with no focal deficits.  Romberg sign and cerebellar reflexes not assessed.  Psychiatric: Normal judgment and insight. Alert and oriented x 3. Anxious mood and appropriate affect.   Labs on Admission: I have personally reviewed following labs and imaging studies  CBC: Recent Labs  Lab 10/24/17 1509  WBC 24.2*  NEUTROABS 21.5*  HGB 10.5*  HCT 32.1*  MCV 78.9  PLT 627*   Basic Metabolic Panel: Recent Labs  Lab 10/24/17 1509  NA 134*  K 2.7*  CL 88*  CO2 29  GLUCOSE 173*  BUN 9  CREATININE 0.55  CALCIUM 9.4  MG 1.4*   GFR: Estimated Creatinine Clearance:  61.9 mL/min (by C-G formula based on SCr of 0.55 mg/dL). Liver Function Tests: No results for input(s): AST, ALT, ALKPHOS, BILITOT, PROT, ALBUMIN in the last 168 hours. No results for input(s): LIPASE, AMYLASE in the last 168 hours. No results for input(s): AMMONIA in the last 168 hours. Coagulation Profile: No results for input(s): INR, PROTIME in the last 168 hours. Cardiac Enzymes: No results for input(s): CKTOTAL, CKMB, CKMBINDEX, TROPONINI in the last 168 hours. BNP (last 3 results) No results for input(s): PROBNP in the last 8760 hours. HbA1C: No results for input(s): HGBA1C in the last 72 hours. CBG: No results for input(s): GLUCAP in the last 168 hours. Lipid Profile: No results for input(s): CHOL, HDL, LDLCALC, TRIG, CHOLHDL, LDLDIRECT in the last 72 hours. Thyroid Function Tests: No results for input(s): TSH, T4TOTAL, FREET4, T3FREE, THYROIDAB in the last 72 hours. Anemia Panel: No results for input(s): VITAMINB12, FOLATE, FERRITIN, TIBC, IRON, RETICCTPCT in the last 72 hours. Urine analysis:    Component Value Date/Time   COLORURINE YELLOW 10/09/2017 0930   APPEARANCEUR TURBID (A) 10/09/2017 0930   LABSPEC 1.017 10/09/2017 0930   PHURINE 6.0 10/09/2017 0930   GLUCOSEU NEGATIVE 10/09/2017 0930   HGBUR LARGE (A) 10/09/2017 0930   BILIRUBINUR NEGATIVE 10/09/2017 0930   KETONESUR 5 (A) 10/09/2017 0930   PROTEINUR >=300 (A) 10/09/2017 0930   UROBILINOGEN 1.0 08/04/2012 1938   NITRITE NEGATIVE 10/09/2017 0930   LEUKOCYTESUR MODERATE (A) 10/09/2017 0930   Sepsis Labs: !!!!!!!!!!!!!!!!!!!!!!!!!!!!!!!!!!!!!!!!!!!! @LABRCNTIP (procalcitonin:4,lacticidven:4) )No results found for this or any previous visit (from the past 240 hour(s)).   Radiological Exams on Admission: Ct Pelvis W Contrast  Result Date: 10/24/2017 CLINICAL DATA:  LEFT groin abscess into LEFT thigh EXAM: CT PELVIS WITH CONTRAST TECHNIQUE: Multidetector CT imaging of the pelvis was performed using the  standard protocol following the bolus administration of intravenous contrast. Sagittal and coronal MPR images reconstructed from axial data set. CONTRAST:  140mL ISOVUE-300 IOPAMIDOL (ISOVUE-300) INJECTION 61% IV COMPARISON:  CT pelvis 09/09/2017 FINDINGS: Urinary Tract: Suprapubic catheter. Inferior pole RIGHT kidney normal appearance. Ureters and decompressed bladder otherwise unremarkable. Bowel: Prominent stool in rectum. Remaining visualized pelvic bowel loops unremarkable. Vascular/Lymphatic: Scattered atherosclerotic calcifications throughout the iliac systems and at distal aorta. Few scattered pelvic phleboliths. Reproductive: Uterus surgically absent. Normal sized ovaries bilaterally. Other: Small umbilical hernia  containing fat. Irregular fluid collection with enhancing margins and septations identified in the medial proximal LEFT thigh, extending from anterior to the LEFT pubic body inferiorly to the level of the proximal to mid LEFT femoral diaphysis, collection overall measuring 9.0 x 5.8 x >11.1 cm; inferior extent is not imaged on this exam. Finding is consistent with an abscess. The collection with surrounding inflammation extends to abut the anterior aspect of the LEFT pubic body. No intrapelvic extension. Tissue planes surrounding the bladder and rectum appear clear. Musculoskeletal: Diffuse osseous demineralization. Diffuse muscular atrophy. No bone destruction at the pubic body to suggest osteomyelitis though the inflammation extends to the anterior margin. Hip joints preserved. SI joints symmetric. IM nail proximal RIGHT femur. Calcification identified at the RIGHT ileo psoas muscle at the musculotendinous junction question sequela of prior trauma. Additionally, subcutaneous infiltration with minimal gas noted at the posterior inferior RIGHT buttock, extending towards the RIGHT ischium compatible with a decubitus ulcer; no drainable fluid collection seen. IMPRESSION: Multiloculated fluid  collection with irregular enhancing margins and septations identified at the medial aspect of the upper LEFT thigh, extending from the LEFT pubic body to the level of the proximal to mid LEFT femoral diaphysis, 9.0 x 5.8 x 11.1 cm consistent with abscess. Inferior extent is not imaged; if further imaging is required distally, may consider MR or CT. Prominent stool in rectum. Small umbilical hernia containing fat. No intrapelvic abnormalities or intrapelvic extension of inflammatory process seen. Inferior RIGHT gluteal decubitus ulcer. Electronically Signed   By: Lavonia Dana M.D.   On: 10/24/2017 16:45   EKG: Independently reviewed.  Sinus tachycardia with lateral lead ST slight depression.  Rate was 127 no evidence of any ST elevation.  Assessment/Plan Active Problems:   Inguinal abscess  SIRS 2/2 left-sided Inguinal Abscess and Cellulitis -Admit to inpatient telemetry -CT Scan of Pelvis showed "Multiloculated fluid collection with irregular enhancing margins and septations identified at the medial aspect of the upper LEFT thigh, extending from the LEFT pubic body to the level of the proximal to mid LEFT femoral diaphysis, 9.0 x 5.8 x 11.1 cm consistent with abscess.Inferior extent is not imaged; if further imaging is required distally, may consider MR or CT." -Patient presented to [facility] also emergency room she was found to be tachypneic, tachycardic and a white blood cell count of 24.2 however she was afebrile and lactic acid level was normal at 1.66 -Given 2 L of IV fluid boluses and placed on 100 mL/hr of IV normal saline +40 mEq of potassium chloride -Obtain blood cultures x2 -Surgery consulted for further evaluation and management and they will plan to take the patient to the OR tonight -We will currently keep the patient n.p.o. at this time -We will start empiric antibiotics with IV vancomycin and IV Unasyn after patient has been taken to the OR cultures of been obtained and patient has had  her incision and drainage  Hypokalemia -Patient's potassium on admission was 2.7 -Replete with IV potassium chloride 40 mEq.  Will also hydrate the patient with normal saline +40 mEq of potassium chloride in the fluid -Continue to monitor and replete as necessary -Repeat CMP in a.m.  Hypomagnesemia -Patient's magnesium level 1.4 on admission -Treat with IV mag sulfate 2 g -Continue to monitor and replete as necessary -Repeat magnesium level in a.m.  Hyponatremia/Hypochloremia -Likely from dehydration -Continue IV fluid hydration as above -Repeat CMP in a.m.  Thrombocytosis -Likely reactive however patient's platelet count has been elevated for last 2 weeks. -Continue  to monitor and repeat CBC in a.m.  Normocytic Anemia -Patient's Hemoglobin/Hematocrit stable at 10.5/32.1 -Continue to monitor for signs and symptoms of bleeding -Repeat CBC in a.m.  Multiple Sclerosis with Paraplegia, bilateral foot drop, and Neurogenic bladder -Recent Admission to [facility] For complicated UTI.  Her catheter was changed out at that time -Continue with Baclofen, gabapentin, prednisone, Inderal LA -Old of care and management  Essential Hypertension  -Currently hold amlodipine 10 mg p.o. Nightly for surgery  Diabetes mellitus type 2 insulin-dependent -Patient takes 25 units of Lantus at home however will place her on 20 units subcu nightly -Add moderate NovoLog sliding scale before meals and at bedtime -Check hemoglobin A1c -Due to monitor CBGs closely  Sinus Tachycardia -Likely pain mediated dehydration -Continue with IV fluid hydration -Pain control with  Sacral Decubitus Ulcer on Right -poA -Unable to turn patient to view due to pain -WOC Nurse Consult -CT Scan showed Inferior Right Gluteal Decubitus Ulcer  DVT prophylaxis: Heparin 5,000 Units sq Code Status: DNR Family Communication: No family present at bedside Disposition Plan: Anticipate SNF Discharge Consults called:  General Surgery; Case Discussed with ID via Telephone Admission status: Inpatient Telemetry    Severity of Illness: The appropriate patient status for this patient is INPATIENT. Inpatient status is judged to be reasonable and necessary in order to provide the required intensity of service to ensure the patient's safety. The patient's presenting symptoms, physical exam findings, and initial radiographic and laboratory data in the context of their chronic comorbidities is felt to place them at high risk for further clinical deterioration. Furthermore, it is not anticipated that the patient will be medically stable for discharge from the hospital within 2 midnights of admission. The following factors support the patient status of inpatient.   " The patient's presenting symptoms include Leg Pain. " The worrisome physical exam findings include Painful palpation. " The initial radiographic and laboratory data are worrisome because of Extensive Abscess. " The chronic co-morbidities include cirrhosis, diabetes, hypertension, paraplegia.  * I certify that at the point of admission it is my clinical judgment that the patient will require inpatient hospital care spanning beyond 2 midnights from the point of admission due to high intensity of service, high risk for further deterioration and high frequency of surveillance required.Kerney Elbe, D.O. [facility] Pager [phone]  If 7PM-7AM, please contact night-coverage [email or web address] Password [facility]  10/24/2017, 6:16 PM

## 2017-10-24 NOTE — ED Notes (Signed)
Patient transported to CT 

## 2017-10-24 NOTE — Consult Note (Signed)
Re:   Laura Roberts DOB:   09-10-1940 MRN:   [id number]  Chief Complaint Groin infection  ASSESEMENT AND PLAN: 1.  Left groin abscess  Plan I&D in OR tonight.  Discussed with patient.  Will need dressing changes post op.  Probably in the hospital 3 to 5 days to control infection  2.  Multiple sclerosis  X 12 years - followed by Laura Roberts  Now bedridden.  She cannot stand or walk 3.  DM  X 5 years.  On insulin over the last month. 4.  Obesity 5.  Inferior right decubitus ulcer 6.  Umbilical hernia 7.  Recent admission for hypertensive emergency 8.  Suprapubic catheter  For neurogenic bladder 9.  2 x 4 cm sacral decubiti 10. HTN 11.  On chronic low dose prednisone  Chief Complaint  Patient presents with  . Abscess  . Leg Pain   PHYSICIAN REQUESTING CONSULTATION:  Laura Agar, PA, [place]  HISTORY OF PRESENT ILLNESS: Laura Roberts is a 77 y.o. (DOB: 04-03-1941)  AA female whose primary care physician is Laura Adie, MD.  She is with [facility].  She came by herself, but a friend, Laura Roberts came in after I examined her.   I spoke to her sone, Laura Roberts, on the phone.   She came to the [facility] ER for increasing right groin pain.  She said that it has only been going on for one day.  She has not had an abscess in the left groin before.   CT scan of pelvis - 10/24/2017 - Multiloculated fluid collection with irregular enhancing margins and septations identified at the medial aspect of the upper LEFT thigh, extending from the LEFT pubic body to the level of the proximal to mid LEFT femoral diaphysis, 9.0 x 5.8 x 11.1 cm consistent with abscess.  2.  Prominent stool in rectum.  3. Small umbilical hernia containing fat.  4.  Inferior RIGHT gluteal decubitus ulcer.    Past Medical History:  Diagnosis Date  . Degenerative arthritis   . Diabetes mellitus without complication ([place])   . Gait disturbance   . Hypertension   . Multiple sclerosis ([place])   . Neuromuscular disorder  (HCC)    Bilateral hand carpal tunnel syndrome  . Obesity   . Spinal stenosis in cervical region   . Spinal stenosis of lumbosacral region   . Spinal stenosis, thoracic       Past Surgical History:  Procedure Laterality Date  . ABDOMINAL HYSTERECTOMY    . BACK SURGERY    . CATARACT EXTRACTION Right   . CHOLECYSTECTOMY    . FEMUR IM NAIL Right 06/02/2014   Procedure: INTRAMEDULLARY (IM) RETROGRADE FEMORAL NAILING;  Surgeon: Johnny Bridge, MD;  Location: [facility];  Service: Orthopedics;  Laterality: Right;  . IR CATHETER TUBE CHANGE  10/13/2017  . left hand carpal tunnel surgery    . REPLACEMENT TOTAL KNEE BILATERAL  2004      Current Facility-Administered Medications  Medication Dose Route Frequency Provider Last Rate Last Dose  . clindamycin (CLEOCIN) IVPB 600 mg  600 mg Intravenous Once [address], [place], [place]      . magnesium sulfate IVPB 2 g 50 mL  2 g Intravenous Once [address], [place], [place]      . potassium chloride 10 mEq in 100 mL IVPB  10 mEq Intravenous Q1 Hr x 4 [address], [place], [place] 100 mL/hr at 10/24/17 1714 10 mEq at 10/24/17 1714  . sodium chloride 0.9 % bolus  1,000 mL  1,000 mL Intravenous Once [address], [place], [place]       Current Outpatient Medications  Medication Sig Dispense Refill  . acetaminophen (TYLENOL) 325 MG tablet Take 650 mg by mouth 3 (three) times daily.     . Amino Acids-Protein Hydrolys (FEEDING SUPPLEMENT, PRO-STAT SUGAR FREE 64,) LIQD Take 30 mLs by mouth 2 (two) times daily. 900 mL 0  . amLODipine (NORVASC) 10 MG tablet Take 10 mg by mouth at bedtime.     . ARTIFICIAL TEAR OP Apply 1 drop to eye 4 (four) times daily as needed (dry eyes).     . baclofen (LIORESAL) 10 MG tablet 1.5 tablet in the morning, 1 tablet midday, two tablets at night (Patient taking differently: Take 10-20 mg by mouth daily. Take 15 mg in the am,, 10 mg at midday, 20 mg at bedtime.) 135 tablet 5  . bisacodyl (DULCOLAX) 10 MG suppository Place 10 mg rectally daily as needed for  moderate constipation.    . calcium citrate (CALCITRATE - DOSED IN MG ELEMENTAL CALCIUM) 950 MG tablet Take 200 mg of elemental calcium by mouth daily.    . cholecalciferol (VITAMIN D) 1000 UNITS tablet Take 1,000 Units by mouth daily.    . Dimethyl Fumarate (TECFIDERA) 240 MG CPDR Take 1 capsule (240 mg total) by mouth 2 (two) times daily. 60 capsule 11  . DULoxetine (CYMBALTA) 30 MG capsule Take 90 mg by mouth daily.     Marland Kitchen gabapentin (NEURONTIN) 300 MG capsule Take 1 capsule (300 mg total) by mouth 2 (two) times daily. 60 capsule 0  . insulin glargine (LANTUS) 100 UNIT/ML injection Inject 0.2 mLs (20 Units total) into the skin at bedtime. (Patient taking differently: Inject 25 Units into the skin at bedtime. ) 10 mL 11  . Lidocaine (ASPERCREME LIDOCAINE) 4 % PTCH Apply 1 patch topically daily as needed (To painful areas).    . magnesium hydroxide (MILK OF MAGNESIA) 400 MG/5ML suspension Take 30 mLs by mouth daily as needed for mild constipation.    . pantoprazole (PROTONIX) 40 MG tablet Take 40 mg by mouth daily.    . polyethylene glycol (MIRALAX / GLYCOLAX) packet Take 17 g by mouth daily as needed for mild constipation.     . predniSONE (DELTASONE) 5 MG tablet 1 tablet on odd days, one half tablet on even days (Patient taking differently: Take 2.5-5 mg by mouth See admin instructions. 1 tablet (5mg ) on odd days, one half (2.5mg ) tablet on even days)    . senna-docusate (SENOKOT-S) 8.6-50 MG tablet Take 2 tablets by mouth at bedtime.    . Skin Protectants, Misc. (EUCERIN) cream Apply 1 application topically 2 (two) times daily.    Marland Kitchen tolterodine (DETROL LA) 4 MG 24 hr capsule Take 4 mg by mouth daily.        Allergies  Allergen Reactions  . Codeine Other (See Comments)    Difficult breathing and skin problem  . Ultram [Tramadol] Other (See Comments)    Difficult breathing and skin peeling  . Januvia [Sitagliptin] Rash    Blisters    REVIEW OF SYSTEMS: Skin:  No history of rash.  No  history of abnormal moles. Infection:  History of recurrent UTI's Neurologic:  Multiple sclerosis x 12 years.  Sees Laura Roberts. Cardiac:  History of HTN. Pulmonary:  Does not smoke cigarettes.  No asthma or bronchitis.  No OSA/CPAP.  Endocrine:  DM x 5 years Gastrointestinal:  History of GERD  No history of liver  disease.  No history of gall bladder disease.   No history of colon disease. Urologic:  Suprapubic tube.  Recently admitted to [facility] for UTI.  10/09/2017 to 10/14/2017 Musculoskeletal:  Bedridden.  She has had a right femur fx about 5 years ago.  She has had both knees replaced. Hematologic:  No bleeding disorder.  No history of anemia.  Not anticoagulated. Psycho-social:  The patient is oriented.   The patient has no obvious psychologic or social impairment to understanding our conversation and plan.  SOCIAL and FAMILY HISTORY: Widowed. She lives at [facility], skilled nursing.  She says that she has been there one month. She has two sons:  La [place]aci[place], in [place] [phone]) and Leane Para, in [place] [phone])  PHYSICAL EXAM: BP 124/66   Pulse (!) 128   Temp 98.2 F (36.8 C) (Oral)   Resp (!) 21   Ht 5\' 2"  (1.575 m)   Wt 83.9 kg (185 lb)   SpO2 97%   BMI 33.84 kg/m   General: Obese AA F who is alert. Skin:  Inspection and palpation - no mass or rash. Eyes:  Conjunctiva and lids unremarkable.            Pupils are equal Ears, Nose, Mouth, and Throat:  Ears and nose unremarkable            Lips and teeth are unremarable. Neck: Supple. No mass, trachea midline.  No thyroid mass. Lymph Nodes:  No supraclavicular, cervical, or inguinal nodes. Lungs: Normal respiratory effort.  Clear to auscultation and symmetric breath sounds. Heart:  Palpation of the heart is normal.            Auscultation: Tachy. No murmur or rub.             Abdomen: Soft. No mass. No tenderness. No hernia.             Normal bowel sounds.   Has suprapubic tube.   Buttocks:  Sacral decubiti, about 2 x 4  cm Musculoskeletal:  She has an 8 x 14 cm mass/abscess of her left groin.  No movement of lower extremities. Neurologic:  No movement of her lower extremities.  She has good strength of her upper extremities Psychiatric: Normal judgement and insight. Behavior is normal.            Oriented to time, person, place.   DATA REVIEWED, COUNSELING AND COORDINATION OF CARE: Epic notes reviewed. Counseling and coordination of care exceeded more than 50% of the time spent with patient. Total time spent with patient and charting: 50 minutes  Alphonsa Overall, MD,  [facility], [address]  [place], [place]    [place] Phone:  [phone] FAX:  [phone]

## 2017-10-25 ENCOUNTER — Other Ambulatory Visit: Payer: Self-pay

## 2017-10-25 DIAGNOSIS — E876 Hypokalemia: Secondary | ICD-10-CM

## 2017-10-25 DIAGNOSIS — M21371 Foot drop, right foot: Secondary | ICD-10-CM

## 2017-10-25 DIAGNOSIS — Z794 Long term (current) use of insulin: Secondary | ICD-10-CM

## 2017-10-25 DIAGNOSIS — N319 Neuromuscular dysfunction of bladder, unspecified: Secondary | ICD-10-CM

## 2017-10-25 DIAGNOSIS — K219 Gastro-esophageal reflux disease without esophagitis: Secondary | ICD-10-CM

## 2017-10-25 DIAGNOSIS — M21372 Foot drop, left foot: Secondary | ICD-10-CM

## 2017-10-25 DIAGNOSIS — G8220 Paraplegia, unspecified: Secondary | ICD-10-CM

## 2017-10-25 DIAGNOSIS — L89314 Pressure ulcer of right buttock, stage 4: Secondary | ICD-10-CM

## 2017-10-25 DIAGNOSIS — R6510 Systemic inflammatory response syndrome (SIRS) of non-infectious origin without acute organ dysfunction: Secondary | ICD-10-CM

## 2017-10-25 DIAGNOSIS — R531 Weakness: Secondary | ICD-10-CM

## 2017-10-25 DIAGNOSIS — R000 Tachycardia, unspecified: Secondary | ICD-10-CM

## 2017-10-25 LAB — COMPREHENSIVE METABOLIC PANEL
ALBUMIN: 2 g/dL — AB (ref 3.5–5.0)
ALK PHOS: 76 U/L (ref 38–126)
ALT: UNDETERMINED U/L (ref 14–54)
ANION GAP: 16 — AB (ref 5–15)
AST: 9 U/L — ABNORMAL LOW (ref 15–41)
BILIRUBIN TOTAL: 0.9 mg/dL (ref 0.3–1.2)
BUN: 8 mg/dL (ref 6–20)
CALCIUM: 8.9 mg/dL (ref 8.9–10.3)
CO2: 27 mmol/L (ref 22–32)
Chloride: 91 mmol/L — ABNORMAL LOW (ref 101–111)
Creatinine, Ser: 0.69 mg/dL (ref 0.44–1.00)
GFR calc Af Amer: >60 mL/min (ref >60)
GLUCOSE: 212 mg/dL — AB (ref 65–99)
Potassium: 2.8 mmol/L — ABNORMAL LOW (ref 3.5–5.1)
Sodium: 134 mmol/L — ABNORMAL LOW (ref 135–145)
TOTAL PROTEIN: 5.7 g/dL — AB (ref 6.5–8.1)

## 2017-10-25 LAB — GLUCOSE, CAPILLARY
GLUCOSE-CAPILLARY: 192 mg/dL — AB (ref 65–99)
GLUCOSE-CAPILLARY: 150 mg/dL — AB (ref 65–99)
Glucose-Capillary: 141 mg/dL — ABNORMAL HIGH (ref 65–99)
Glucose-Capillary: 195 mg/dL — ABNORMAL HIGH (ref 65–99)

## 2017-10-25 LAB — HEMOGLOBIN A1C
HEMOGLOBIN A1C: 6.9 % — AB (ref 4.8–5.6)
Mean Plasma Glucose: 151.33 mg/dL

## 2017-10-25 LAB — URINALYSIS, COMPLETE (UACMP) WITH MICROSCOPIC
BILIRUBIN URINE: NEGATIVE
GLUCOSE, UA: NEGATIVE mg/dL
Ketones, ur: 20 mg/dL — AB
NITRITE: NEGATIVE
PH: 5 (ref 5.0–8.0)
Protein, ur: NEGATIVE mg/dL
SPECIFIC GRAVITY, URINE: 1.036 — AB (ref 1.005–1.030)

## 2017-10-25 LAB — CBC
HCT: 29.6 % — ABNORMAL LOW (ref 36.0–46.0)
Hemoglobin: 9.6 g/dL — ABNORMAL LOW (ref 12.0–15.0)
MCH: 25.7 pg — ABNORMAL LOW (ref 26.0–34.0)
MCHC: 32.4 g/dL (ref 30.0–36.0)
MCV: 79.4 fL (ref 78.0–100.0)
PLATELETS: 481 10*3/uL — AB (ref 150–400)
RBC: 3.73 MIL/uL — ABNORMAL LOW (ref 3.87–5.11)
RDW: 19 % — AB (ref 11.5–15.5)
WBC: 26.2 10*3/uL — ABNORMAL HIGH (ref 4.0–10.5)

## 2017-10-25 LAB — PHOSPHORUS: PHOSPHORUS: 3.1 mg/dL (ref 2.5–4.6)

## 2017-10-25 LAB — MAGNESIUM: Magnesium: 1.4 mg/dL — ABNORMAL LOW (ref 1.7–2.4)

## 2017-10-25 MED ORDER — POTASSIUM CHLORIDE CRYS ER 20 MEQ PO TBCR
40.0000 meq | EXTENDED_RELEASE_TABLET | Freq: Two times a day (BID) | ORAL | Status: DC
  Administered 2017-10-25 (×2): 40 meq via ORAL
  Filled 2017-10-25 (×2): qty 2

## 2017-10-25 MED ORDER — MAGNESIUM SULFATE 2 GM/50ML IV SOLN
2.0000 g | Freq: Once | INTRAVENOUS | Status: AC
  Administered 2017-10-25: 2 g via INTRAVENOUS

## 2017-10-25 MED ORDER — DIMETHYL FUMARATE 240 MG PO CPDR
240.0000 mg | DELAYED_RELEASE_CAPSULE | Freq: Two times a day (BID) | ORAL | Status: DC
  Administered 2017-10-25 – 2017-10-29 (×8): 240 mg via ORAL
  Filled 2017-10-25: qty 1

## 2017-10-25 NOTE — Progress Notes (Signed)
[facility]:  [phone] General Surgery Progress Note   LOS: 1 day  POD -  1 Day Post-Op  Chief Complaint: Left groin/thigh pain  Assessment and Plan: 1.  IRRIGATION AND DEBRIDEMENT, LEFT THIGH ABCESS - 10/24/2017 - D. Rosary Filosa  WBC - 26,200 - 08/27/2017  Unasyn/Vancomycin - 2/26 >>>  Wound looks okay.  Start BID dressing changes  2.  Multiple sclerosis             X 12 years - followed by Dr. Jannifer Franklin             Now bedridden.  She cannot stand or walk 3.  DM             X 5 years.  On insulin over the last month. 4.  Obesity 5.  Suprapubic catheter             For neurogenic bladder 6.  2 x 4 cm sacral decubiti 7. HTN 8.  On chronic low dose prednisone 9. K+ -   2.8 - 10/25/2017 10. Anemia   Hgb - 9.6 - 10/25/2017 11.  DVT prophylaxis - SQ Heparin   Active Problems:   Multiple sclerosis (HCC)   Weakness   HTN (hypertension)   Tachycardia   GERD (gastroesophageal reflux disease)   Neurogenic bladder   Foot drop, bilateral   Paraplegia (HCC)   Decubitus ulcer of buttock, stage 4 (HCC)   Hypokalemia   Diabetes mellitus type 2, insulin dependent (HCC)   Hypomagnesemia   Inguinal abscess   SIRS (systemic inflammatory response syndrome) (HCC)   Subjective:  Doing okay.  Some pain, but better in left groin/thigh.  Objective:   Vitals:   10/24/17 2035 10/25/17 0504  BP: (!) 115/55 135/74  Pulse: (!) 121 98  Resp: (!) 22 20  Temp: 99.2 F (37.3 C) (!) 97.5 F (36.4 C)  SpO2: 99% 100%     Intake/Output from previous day:  04/26 0701 - 04/27 0700 In: 1165.2 [P.O.:240; I.V.:775.2; IV Piggyback:150] Out: 475 [Urine:475]  Intake/Output this shift:  No intake/output data recorded.   Physical Exam:   General: WN AA F who is alert and oriented.    HEENT: Normal. Pupils equal. .   Lungs: Clear.   Abdomen: Soft   Wound: I changed both dressings.  The wounds are deep, but look okay.   Lab Results:    Recent Labs    10/24/17 1509  10/25/17 0456  WBC 24.2* 26.2*  HGB 10.5* 9.6*  HCT 32.1* 29.6*  PLT 634* 481*    BMET   Recent Labs    10/24/17 1509 10/25/17 0456  NA 134* 134*  K 2.7* 2.8*  CL 88* 91*  CO2 29 27  GLUCOSE 173* 212*  BUN 9 8  CREATININE 0.55 0.69  CALCIUM 9.4 8.9    PT/INR  No results for input(s): LABPROT, INR in the last 72 hours.  ABG  No results for input(s): PHART, HCO3 in the last 72 hours.  Invalid input(s): PCO2, PO2   Studies/Results:  Ct Pelvis W Contrast  Result Date: 10/24/2017 CLINICAL DATA:  LEFT groin abscess into LEFT thigh EXAM: CT PELVIS WITH CONTRAST TECHNIQUE: Multidetector CT imaging of the pelvis was performed using the standard protocol following the bolus administration of intravenous contrast. Sagittal and coronal MPR images reconstructed from axial data set. CONTRAST:  177mL ISOVUE-300 IOPAMIDOL (ISOVUE-300) INJECTION 61% IV COMPARISON:  CT pelvis 09/09/2017 FINDINGS: Urinary Tract: Suprapubic catheter. Inferior pole RIGHT kidney normal  appearance. Ureters and decompressed bladder otherwise unremarkable. Bowel: Prominent stool in rectum. Remaining visualized pelvic bowel loops unremarkable. Vascular/Lymphatic: Scattered atherosclerotic calcifications throughout the iliac systems and at distal aorta. Few scattered pelvic phleboliths. Reproductive: Uterus surgically absent. Normal sized ovaries bilaterally. Other: Small umbilical hernia containing fat. Irregular fluid collection with enhancing margins and septations identified in the medial proximal LEFT thigh, extending from anterior to the LEFT pubic body inferiorly to the level of the proximal to mid LEFT femoral diaphysis, collection overall measuring 9.0 x 5.8 x >11.1 cm; inferior extent is not imaged on this exam. Finding is consistent with an abscess. The collection with surrounding inflammation extends to abut the anterior aspect of the LEFT pubic body. No intrapelvic extension. Tissue planes surrounding the bladder  and rectum appear clear. Musculoskeletal: Diffuse osseous demineralization. Diffuse muscular atrophy. No bone destruction at the pubic body to suggest osteomyelitis though the inflammation extends to the anterior margin. Hip joints preserved. SI joints symmetric. IM nail proximal RIGHT femur. Calcification identified at the RIGHT ileo psoas muscle at the musculotendinous junction question sequela of prior trauma. Additionally, subcutaneous infiltration with minimal gas noted at the posterior inferior RIGHT buttock, extending towards the RIGHT ischium compatible with a decubitus ulcer; no drainable fluid collection seen. IMPRESSION: Multiloculated fluid collection with irregular enhancing margins and septations identified at the medial aspect of the upper LEFT thigh, extending from the LEFT pubic body to the level of the proximal to mid LEFT femoral diaphysis, 9.0 x 5.8 x 11.1 cm consistent with abscess. Inferior extent is not imaged; if further imaging is required distally, may consider MR or CT. Prominent stool in rectum. Small umbilical hernia containing fat. No intrapelvic abnormalities or intrapelvic extension of inflammatory process seen. Inferior RIGHT gluteal decubitus ulcer. Electronically Signed   By: Lavonia Dana M.D.   On: 10/24/2017 16:45     Anti-infectives:   Anti-infectives (From admission, onward)   Start     Dose/Rate Route Frequency Ordered Stop   10/25/17 2000  vancomycin (VANCOCIN) IVPB 750 mg/150 ml premix     750 mg 150 mL/hr over 60 Minutes Intravenous Every 24 hours 10/24/17 1934     10/25/17 0100  Ampicillin-Sulbactam (UNASYN) 3 g in sodium chloride 0.9 % 100 mL IVPB     3 g 200 mL/hr over 30 Minutes Intravenous Every 6 hours 10/24/17 1934     10/24/17 1945  vancomycin (VANCOCIN) 1,500 mg in sodium chloride 0.9 % 500 mL IVPB     1,500 mg 250 mL/hr over 120 Minutes Intravenous NOW 10/24/17 1934 10/24/17 2159   10/24/17 1930  piperacillin-tazobactam (ZOSYN) IVPB 3.375 g      3.375 g 100 mL/hr over 30 Minutes Intravenous  Once 10/24/17 1915 10/24/17 1857   10/24/17 1828  piperacillin-tazobactam (ZOSYN) 3.375 GM/50ML IVPB    Note to Pharmacy:  Eben Burow   : cabinet override      10/24/17 1828 10/24/17 1857   10/24/17 1808  clindamycin (CLEOCIN) 900 MG/50ML IVPB  Status:  Discontinued    Note to Pharmacy:  Marchia Meiers   : cabinet override      10/24/17 1808 10/24/17 2020   10/24/17 1730  clindamycin (CLEOCIN) IVPB 600 mg  Status:  Discontinued     600 mg 100 mL/hr over 30 Minutes Intravenous  Once 10/24/17 1726 10/24/17 1748      Alphonsa Overall, MD, FACS Pager: [facility]: [phone] 10/25/2017

## 2017-10-25 NOTE — Progress Notes (Addendum)
PROGRESS NOTE    Laura Roberts  NFA:[id number] DOB: 1941-06-16 DOA: 10/24/2017 PCP: [name], MD   Brief Narrative:  Laura Roberts is a 77 y.o. female with medical history significant of multiple sclerosis, paraplegia, diabetes mellitus type 2 insulin-dependent, hypertension, spinal stenosis, neurogenic bladder, bilateral foot drop, history of complicated UTIs, history of sacral decubitus ulcer, as well as other comorbidities  who presented to worsening of emergency room with a chief complaint of proximal thigh leg pain.  She states that she woke up this morning described as very painful and states he was a 10 out of 10 which was constant, nonradiating and stated that it was aching pain. She stated it felt warm and swollen to the touch and it was near her vaginal area.  Says she is not able to fully see it because of her paraplegia and has very little to no feeling from the waist down.  She denies any shortness of breath,  nausea, vomiting, lightheadedness, dizziness, or chest pain.  States her muscles were aching.  [facility] was called to admit for an extensive inguinal abscess with erythema. General surgery was consulted and took the patient to the OR for irrigation and debridement on 10/24/2017.  She is postoperative day 1 and slightly improved however still complains of pain.  Assessment & Plan:   Active Problems:   Multiple sclerosis (HCC)   Weakness   HTN (hypertension)   Tachycardia   GERD (gastroesophageal reflux disease)   Neurogenic bladder   Foot drop, bilateral   Paraplegia (HCC)   Decubitus ulcer of buttock, stage 4 (HCC)   Hypokalemia   Diabetes mellitus type 2, insulin dependent (HCC)   Hypomagnesemia   Inguinal abscess   SIRS (systemic inflammatory response syndrome) (HCC)  SIRS 2/2 left-sided Inguinal Abscess and Cellulitis s/p I&D POD1 -Admitted to Inpatient Telemetry -CT Scan of Pelvis showed "Multiloculated fluid collection with irregular enhancing margins and  septations identified at the medial aspect of the upper LEFT thigh, extending from the LEFT pubic body to the level of the proximal to mid LEFT femoral diaphysis, 9.0 x 5.8 x 11.1 cm consistent with abscess.Inferior extent is not imaged; if further imaging is required distally, may consider MR or CT." -Patient presented to [facility] also emergency room she was found to be tachypneic, tachycardic and a white blood cell count of 24.2 however she was afebrile and lactic acid level was normal at 1.66 -White blood cell count now is 26,200. -Given 2 L of IV fluid boluses and placed on 100 mL/hr of IV normal saline +40 mEq of potassium chloride but now reduced to half normal saline at 50 mL's per hour with 20 mEq of KCl -Obtained blood cultures x2 and show no growth to date less than 12 hours -Continue with pain control with IV fentanyl 25 mcg every hour for severe pain and Oxycodone-Acetaminophen 1 to 2 tablets p.o. every 6 hours as needed for moderate pain -Surgery consulted for further evaluation and management and took the patient for incision and drainage 10/24/2017 -Placed on a Carb Modified Diet now that she is no longer n.p.o. -C/w Empiric antibiotics with IV vancomycin and IV Unasyn  -Gram stain on abscess culture showed abundant WBCs present predominantly PMNs.  There is also abundant gram-positive cocci in pairs as well as gram-negative rods and few gram-positive rods.  The aerobic/anaerobic culture still pending -General surgery patient is to start twice daily dressing changes  Hypokalemia -Patient's potassium on admission was 2.8 -Repleted with p.o.  potassium chloride 40 mg twice daily along with half-normal saline with 20 mEq of KCl IV fluid running -Continue to monitor and replete as necessary -Repeat CMP in a.m.  Hypomagnesemia -Patient's magnesium level 1.4 this AM -Replete with IV mag sulfate 2 g -Continue to monitor and replete as necessary -Repeat magnesium level in  a.m.  Hyponatremia/Hypochloremia -Likely from dehydration -Continue IV fluid hydration as above -Sodium is now 134 and Chloride is now 91 -Repeat CMP in a.m.  Thrombocytosis -Likely reactive however patient's platelet count has been elevated for last 2 weeks. -Trending down.  Patient's platelet count went from 634 and is now 481 -Continue to monitor and repeat CBC in a.m.  Normocytic Anemia -Patient's Hemoglobin/Hematocrit went from 10.5/32.1 -> 1.6/29.6 -Continue to monitor for signs and symptoms of bleeding -Repeat CBC in a.m.  Multiple Sclerosis with Paraplegia, bilateral foot drop, and Neurogenic bladder -Recent Admission to [facility] For complicated UTI.  Her catheter was changed out at that time -Continue with Baclofen, gabapentin, prednisone, Inderal LA -We will resume the patient's home [employer] for her MS -Continue with supportive care care and management  Essential Hypertension  -Currently Held Amlodipine 10 mg p.o. Nightly -May resume in the a.m.  Diabetes MellitusType 2 insulin-dependent -Patient takes 25 units of Lantus at home however will place her on 20 units subcu nightly -Added moderate NovoLog sliding scale before meals and at bedtime -Check Hemoglobin A1c; The Last Hemoglobin in our system was 7.6 now is back in 2014 -Continue to monitor CBGs closely; BPs have been ranging from 150-192  Sinus Tachycardia -Slightly improved patient's heart rate is now in the high 90s now -Likely pain mediated dehydration -Continue with IV fluid hydration with half-normal saline at 50 mL's per hour +20 mg of KCl  Sacral Decubitus Ulcer on Right -poA -Unable to turn patient again to view due to pain -[place] ordered and pending  -CT Scan showed Inferior Right Gluteal Decubitus Ulcer  DVT prophylaxis: Heparin 5000 units subcu every Code Status: DO NOT RESUSCITATE Family Communication: No family present at bedside  Disposition Plan: Anticipate  discharge possible SNF the next 48 to 72 hours if improved  Consultants:   General Surgery    Procedures: Left Thigh Abscess Irrigation and Debridement 10/24/17   Antimicrobials:  Anti-infectives (From admission, onward)   Start     Dose/Rate Route Frequency Ordered Stop   10/25/17 2000  vancomycin (VANCOCIN) IVPB 750 mg/150 ml premix     750 mg 150 mL/hr over 60 Minutes Intravenous Every 24 hours 10/24/17 1934     10/25/17 0100  Ampicillin-Sulbactam (UNASYN) 3 g in sodium chloride 0.9 % 100 mL IVPB     3 g 200 mL/hr over 30 Minutes Intravenous Every 6 hours 10/24/17 1934     10/24/17 1945  vancomycin (VANCOCIN) 1,500 mg in sodium chloride 0.9 % 500 mL IVPB     1,500 mg 250 mL/hr over 120 Minutes Intravenous NOW 10/24/17 1934 10/24/17 2159   10/24/17 1930  piperacillin-tazobactam (ZOSYN) IVPB 3.375 g     3.375 g 100 mL/hr over 30 Minutes Intravenous  Once 10/24/17 1915 10/24/17 1857   10/24/17 1828  piperacillin-tazobactam (ZOSYN) 3.375 GM/50ML IVPB    Note to Pharmacy:  Eben Burow   : cabinet override      10/24/17 1828 10/24/17 1857   10/24/17 1808  clindamycin (CLEOCIN) 900 MG/50ML IVPB  Status:  Discontinued    Note to Pharmacy:  Marchia Meiers   : cabinet override  10/24/17 1808 10/24/17 2020   10/24/17 1730  clindamycin (CLEOCIN) IVPB 600 mg  Status:  Discontinued     600 mg 100 mL/hr over 30 Minutes Intravenous  Once 10/24/17 1726 10/24/17 1748     Subjective: Seen and examined at bedside and still complained of some pain in the thigh as well as the left lower abdomen. No chest pain, shortness breath, nausea, vomiting.  Slept okay.  No other concerns or complaints at this time.  Objective: Vitals:   10/24/17 2035 10/25/17 0500 10/25/17 0504 10/25/17 0700  BP: (!) 115/55  135/74   Pulse: (!) 121  98   Resp: (!) 22  20   Temp: 99.2 F (37.3 C)  (!) 97.5 F (36.4 C)   TempSrc: Oral  Oral   SpO2: 99%  100%   Weight:  80.3 kg (177 lb 0.5 oz)  80.3 kg (177 lb  0.5 oz)  Height:        Intake/Output Summary (Last 24 hours) at 10/25/2017 1432 Last data filed at 10/25/2017 0959 Gross per 24 hour  Intake 1285.17 ml  Output 475 ml  Net 810.17 ml   Filed Weights   10/24/17 1357 10/25/17 0500 10/25/17 0700  Weight: 83.9 kg (185 lb) 80.3 kg (177 lb 0.5 oz) 80.3 kg (177 lb 0.5 oz)   Examination: Physical Exam:  Constitutional: WN/WD obese AAF in NAD and appears calm and comfortable Eyes: Lids and conjunctivae normal, sclerae anicteric  ENMT: External Ears, Nose appear normal. Grossly normal hearing. Mucous membranes are moist. Neck: Appears normal, supple, no cervical masses, normal ROM, no appreciable thyromegaly; no JVD Respiratory: Diminished to auscultation bilaterally, no wheezing, rales, rhonchi or crackles. Normal respiratory effort and patient is not tachypenic. No accessory muscle use.  Cardiovascular: RRR but on the faster side, no murmurs / rubs / gallops. S1 and S2 auscultated. No LE extremity edema. 2+ pedal pulses. No carotid bruits.  Abdomen: Soft, tender to palpate in the LLQ, Distended due to body habitus. No masses palpated. No appreciable hepatosplenomegaly. Bowel sounds positive x4.  GU: Deferred. Musculoskeletal: Patient is paraplegic with a neurogenic bladder and bilateral lower extremity foot Skin: Large indurated abscess on the medial proximal thigh with the inguinal area still warm.  Still has some pain however it is packed.  Unable to assess patient's sacral decubitus due to her pain  Neurologic: CN 2-12 grossly intact with no focal deficits.  Romberg sign and cerebellar reflexes not assessed.  Psychiatric: Normal judgment and insight. Alert and oriented x 3. Normal mood and appropriate affect.   Data Reviewed: I have personally reviewed following labs and imaging studies  CBC: Recent Labs  Lab 10/24/17 1509 10/25/17 0456  WBC 24.2* 26.2*  NEUTROABS 21.5*  --   HGB 10.5* 9.6*  HCT 32.1* 29.6*  MCV 78.9 79.4  PLT  634* 102*   Basic Metabolic Panel: Recent Labs  Lab 10/24/17 1509 10/25/17 0456 10/25/17 0953  NA 134* 134*  --   K 2.7* 2.8*  --   CL 88* 91*  --   CO2 29 27  --   GLUCOSE 173* 212*  --   BUN 9 8  --   CREATININE 0.55 0.69  --   CALCIUM 9.4 8.9  --   MG 1.4*  --  1.4*  PHOS  --   --  3.1   GFR: Estimated Creatinine Clearance: 60.6 mL/min (by C-G formula based on SCr of 0.69 mg/dL). Liver Function Tests: Recent Labs  Lab 10/25/17  0456  AST 9*  ALT QUANTITY NOT SUFFICIENT, UNABLE TO PERFORM TEST  ALKPHOS 76  BILITOT 0.9  PROT 5.7*  ALBUMIN 2.0*   No results for input(s): LIPASE, AMYLASE in the last 168 hours. No results for input(s): AMMONIA in the last 168 hours. Coagulation Profile: No results for input(s): INR, PROTIME in the last 168 hours. Cardiac Enzymes: No results for input(s): CKTOTAL, CKMB, CKMBINDEX, TROPONINI in the last 168 hours. BNP (last 3 results) No results for input(s): PROBNP in the last 8760 hours. HbA1C: No results for input(s): HGBA1C in the last 72 hours. CBG: Recent Labs  Lab 10/24/17 1838 10/24/17 1955 10/24/17 2056 10/25/17 0757 10/25/17 1228  GLUCAP 147* 156* 162* 192* 150*   Lipid Profile: No results for input(s): CHOL, HDL, LDLCALC, TRIG, CHOLHDL, LDLDIRECT in the last 72 hours. Thyroid Function Tests: Recent Labs    10/24/17 2104  TSH 0.182*   Anemia Panel: No results for input(s): VITAMINB12, FOLATE, FERRITIN, TIBC, IRON, RETICCTPCT in the last 72 hours. Sepsis Labs: Recent Labs  Lab 10/24/17 1515  LATICACIDVEN 1.66   Recent Results (from the past 240 hour(s))  Aerobic/Anaerobic Culture (surgical/deep wound)     Status: None (Preliminary result)   Collection Time: 10/24/17  7:13 PM  Result Value Ref Range Status   Specimen Description   Final    ABSCESS LEFT THIGH Performed at [facility] [address] [place], [place] [address]    Special Requests   Final    NONE Performed at [facility], [place] [address], [place], [place] [address]    Gram Stain   Final    ABUNDANT WBC PRESENT, PREDOMINANTLY PMN ABUNDANT GRAM POSITIVE COCCI IN PAIRS ABUNDANT GRAM NEGATIVE RODS FEW GRAM POSITIVE RODS Performed at [facility], [place] [address], [place], [place] [address]    Culture PENDING  Incomplete   Report Status PENDING  Incomplete  Culture, blood (routine x 2)     Status: None (Preliminary result)   Collection Time: 10/24/17  9:04 PM  Result Value Ref Range Status   Specimen Description   Final    BLOOD RIGHT ARM Performed at [facility] [address], [place], [place] [address]    Special Requests   Final    BOTTLES DRAWN AEROBIC AND ANAEROBIC Blood Culture adequate volume Performed at [facility] [address], [place], [place] [address]    Culture   Final    NO GROWTH < 12 HOURS Performed at [facility] [address], [place], [place] [address]    Report Status PENDING  Incomplete  Culture, blood (routine x 2)     Status: None (Preliminary result)   Collection Time: 10/24/17  9:04 PM  Result Value Ref Range Status   Specimen Description   Final    BLOOD RIGHT HAND Performed at [facility] [address], [place], [place] [address]    Special Requests   Final    BOTTLES DRAWN AEROBIC ONLY Blood Culture adequate volume Performed at [facility] [address], [place], [place] [address]    Culture   Final    NO GROWTH < 12 HOURS Performed at [facility] [address] [place], [place] [address]    Report Status PENDING  Incomplete    Radiology Studies: Ct Pelvis W Contrast  Result Date: 10/24/2017 CLINICAL DATA:  LEFT groin abscess into LEFT thigh EXAM: CT PELVIS  WITH CONTRAST TECHNIQUE: Multidetector CT imaging of the pelvis was performed using the standard protocol following the bolus administration of intravenous contrast. Sagittal  and coronal MPR images reconstructed from axial data set. CONTRAST:  156mL ISOVUE-300 IOPAMIDOL (ISOVUE-300) INJECTION 61% IV COMPARISON:  CT pelvis 09/09/2017 FINDINGS: Urinary Tract: Suprapubic catheter. Inferior pole RIGHT kidney normal appearance. Ureters and decompressed bladder otherwise unremarkable. Bowel: Prominent stool in rectum. Remaining visualized pelvic bowel loops unremarkable. Vascular/Lymphatic: Scattered atherosclerotic calcifications throughout the iliac systems and at distal aorta. Few scattered pelvic phleboliths. Reproductive: Uterus surgically absent. Normal sized ovaries bilaterally. Other: Small umbilical hernia containing fat. Irregular fluid collection with enhancing margins and septations identified in the medial proximal LEFT thigh, extending from anterior to the LEFT pubic body inferiorly to the level of the proximal to mid LEFT femoral diaphysis, collection overall measuring 9.0 x 5.8 x >11.1 cm; inferior extent is not imaged on this exam. Finding is consistent with an abscess. The collection with surrounding inflammation extends to abut the anterior aspect of the LEFT pubic body. No intrapelvic extension. Tissue planes surrounding the bladder and rectum appear clear. Musculoskeletal: Diffuse osseous demineralization. Diffuse muscular atrophy. No bone destruction at the pubic body to suggest osteomyelitis though the inflammation extends to the anterior margin. Hip joints preserved. SI joints symmetric. IM nail proximal RIGHT femur. Calcification identified at the RIGHT ileo psoas muscle at the musculotendinous junction question sequela of prior trauma. Additionally, subcutaneous infiltration with minimal gas noted at the posterior inferior RIGHT buttock, extending towards the RIGHT ischium compatible with a decubitus ulcer; no drainable fluid collection seen. IMPRESSION: Multiloculated fluid collection with irregular enhancing margins and septations identified at the medial aspect of  the upper LEFT thigh, extending from the LEFT pubic body to the level of the proximal to mid LEFT femoral diaphysis, 9.0 x 5.8 x 11.1 cm consistent with abscess. Inferior extent is not imaged; if further imaging is required distally, may consider MR or CT. Prominent stool in rectum. Small umbilical hernia containing fat. No intrapelvic abnormalities or intrapelvic extension of inflammatory process seen. Inferior RIGHT gluteal decubitus ulcer. Electronically Signed   By: Lavonia Dana M.D.   On: 10/24/2017 16:45   Scheduled Meds: . acetaminophen  650 mg Oral TID  . baclofen  15 mg Oral QAC breakfast   And  . baclofen  10 mg Oral Q24H  . baclofen  20 mg Oral QHS  . calcium citrate  200 mg of elemental calcium Oral Daily  . cholecalciferol  1,000 Units Oral Daily  . DULoxetine  90 mg Oral Daily  . feeding supplement (PRO-STAT SUGAR FREE 64)  30 mL Oral BID  . fesoterodine  4 mg Oral Daily  . gabapentin  300 mg Oral BID  . heparin injection (subcutaneous)  5,000 Units Subcutaneous Q8H  . insulin aspart  0-15 Units Subcutaneous TID WC  . insulin aspart  0-5 Units Subcutaneous QHS  . insulin glargine  20 Units Subcutaneous QHS  . pantoprazole  40 mg Oral Daily  . potassium chloride  40 mEq Oral BID  . predniSONE  2.5-5 mg Oral See admin instructions  . senna-docusate  2 tablet Oral QHS   Continuous Infusions: . 0.45 % NaCl with KCl 20 mEq / L 50 mL/hr at 10/24/17 2349  . ampicillin-sulbactam (UNASYN) IV 3 g (10/25/17 1321)  . magnesium sulfate    . sodium chloride    . vancomycin      LOS: 1 day   Kerney Elbe, DO [facility] Pager [phone]  If 7PM-7AM, please contact night-coverage [email or web address] Password [facility] 10/25/2017, 2:32 PM

## 2017-10-26 LAB — CBC WITH DIFFERENTIAL/PLATELET
BASOS ABS: 0 10*3/uL (ref 0.0–0.1)
Basophils Relative: 0 %
EOS ABS: 0.1 10*3/uL (ref 0.0–0.7)
Eosinophils Relative: 1 %
HCT: 28.8 % — ABNORMAL LOW (ref 36.0–46.0)
Hemoglobin: 9 g/dL — ABNORMAL LOW (ref 12.0–15.0)
LYMPHS PCT: 10 %
Lymphs Abs: 1.2 10*3/uL (ref 0.7–4.0)
MCH: 25.2 pg — ABNORMAL LOW (ref 26.0–34.0)
MCHC: 31.3 g/dL (ref 30.0–36.0)
MCV: 80.7 fL (ref 78.0–100.0)
MONO ABS: 0.5 10*3/uL (ref 0.1–1.0)
Monocytes Relative: 4 %
NEUTROS PCT: 85 %
Neutro Abs: 10.4 10*3/uL — ABNORMAL HIGH (ref 1.7–7.7)
RBC: 3.57 MIL/uL — ABNORMAL LOW (ref 3.87–5.11)
RDW: 19.2 % — ABNORMAL HIGH (ref 11.5–15.5)
WBC: 12.2 10*3/uL — AB (ref 4.0–10.5)

## 2017-10-26 LAB — COMPREHENSIVE METABOLIC PANEL
ALBUMIN: 2.2 g/dL — AB (ref 3.5–5.0)
ALT: 10 U/L — AB (ref 14–54)
ANION GAP: 11 (ref 5–15)
AST: 15 U/L (ref 15–41)
Alkaline Phosphatase: 71 U/L (ref 38–126)
BUN: 8 mg/dL (ref 6–20)
CHLORIDE: 95 mmol/L — AB (ref 101–111)
CO2: 32 mmol/L (ref 22–32)
CREATININE: 0.48 mg/dL (ref 0.44–1.00)
Calcium: 9.2 mg/dL (ref 8.9–10.3)
GFR calc non Af Amer: >60 mL/min (ref >60)
GLUCOSE: 83 mg/dL (ref 65–99)
Potassium: 4.2 mmol/L (ref 3.5–5.1)
SODIUM: 138 mmol/L (ref 135–145)
Total Bilirubin: 0.6 mg/dL (ref 0.3–1.2)
Total Protein: 6.5 g/dL (ref 6.5–8.1)

## 2017-10-26 LAB — GLUCOSE, CAPILLARY
GLUCOSE-CAPILLARY: 86 mg/dL (ref 65–99)
GLUCOSE-CAPILLARY: 137 mg/dL — AB (ref 65–99)
Glucose-Capillary: 166 mg/dL — ABNORMAL HIGH (ref 65–99)
Glucose-Capillary: 197 mg/dL — ABNORMAL HIGH (ref 65–99)

## 2017-10-26 LAB — URINE CULTURE: CULTURE: NO GROWTH

## 2017-10-26 LAB — PHOSPHORUS: PHOSPHORUS: 2.4 mg/dL — AB (ref 2.5–4.6)

## 2017-10-26 LAB — MAGNESIUM: Magnesium: 1.9 mg/dL (ref 1.7–2.4)

## 2017-10-26 MED ORDER — PREDNISONE 5 MG PO TABS
2.5000 mg | ORAL_TABLET | ORAL | Status: DC
  Administered 2017-10-28: 2.5 mg via ORAL
  Filled 2017-10-26 (×2): qty 1

## 2017-10-26 MED ORDER — PREDNISONE 5 MG PO TABS
5.0000 mg | ORAL_TABLET | ORAL | Status: DC
  Administered 2017-10-27 – 2017-10-29 (×2): 5 mg via ORAL
  Filled 2017-10-26 (×3): qty 1

## 2017-10-26 MED ORDER — PREDNISONE 5 MG PO TABS
2.5000 mg | ORAL_TABLET | Freq: Once | ORAL | Status: AC
  Administered 2017-10-26: 2.5 mg via ORAL
  Filled 2017-10-26: qty 1

## 2017-10-26 MED ORDER — K PHOS MONO-SOD PHOS DI & MONO 155-852-130 MG PO TABS
500.0000 mg | ORAL_TABLET | Freq: Once | ORAL | Status: AC
  Administered 2017-10-26: 500 mg via ORAL
  Filled 2017-10-26: qty 2

## 2017-10-26 MED ORDER — JUVEN PO PACK
1.0000 | PACK | Freq: Two times a day (BID) | ORAL | Status: DC
  Administered 2017-10-26 – 2017-10-27 (×3): 1 via ORAL
  Filled 2017-10-26 (×8): qty 1

## 2017-10-26 MED ORDER — AMLODIPINE BESYLATE 10 MG PO TABS
10.0000 mg | ORAL_TABLET | Freq: Every day | ORAL | Status: DC
  Administered 2017-10-26 – 2017-10-28 (×3): 10 mg via ORAL
  Filled 2017-10-26 (×3): qty 1

## 2017-10-26 NOTE — Progress Notes (Signed)
PT Cancellation Note / SCREEN  Patient Details Name: Laura Roberts MRN: [id number] DOB: 11-16-1940   Cancelled Treatment:    Reason Eval/Treat Not Completed: PT screened, no needs identified, will sign off Pt is total care at SNF and has staff use lift for OOB to power w/c.  Pt reports she was not receiving therapy at SNF.  Pt does not appear appropriate for skilled acute needs.  PT to sign off.   Trina Asch,KATHrine E 10/26/2017, 12:23 PM Carmelia Bake, PT, DPT 10/26/2017 Pager: [phone]

## 2017-10-26 NOTE — Progress Notes (Signed)
[facility] Office:  [phone] General Surgery Progress Note   LOS: 2 days  POD -  2 Days Post-Op  Chief Complaint: Left groin/thigh pain  Assessment and Plan: 1.  IRRIGATION AND DEBRIDEMENT, LEFT THIGH ABCESS - 10/24/2017 - D. Demetri Goshert  WBC - 12,200 - 08/28/2017  Unasyn/Vancomycin - 2/26 >>>  BID dressing changes - wounds okay, just need deep packing  2.  Multiple sclerosis             X 12 years - followed by Dr. Jannifer Franklin             Now bedridden.  She cannot stand or walk 3.  DM             X 5 years.  On insulin over the last month. 4.  Obesity 5.  Suprapubic catheter             For neurogenic bladder 6.  2 x 4 cm sacral decubiti 7.  HTN 8.  On chronic low dose prednisone 9.  Anemia   Hgb - 9.0 - 10/26/2017 10.  DVT prophylaxis - SQ Heparin   Active Problems:   Multiple sclerosis (HCC)   Weakness   HTN (hypertension)   Tachycardia   GERD (gastroesophageal reflux disease)   Neurogenic bladder   Foot drop, bilateral   Paraplegia (HCC)   Decubitus ulcer of buttock, stage 4 (HCC)   Hypokalemia   Diabetes mellitus type 2, insulin dependent (HCC)   Hypomagnesemia   Inguinal abscess   SIRS (systemic inflammatory response syndrome) (HCC)   Subjective:  Still has left thigh pain.  But taking diet and looks okay.  Objective:   Vitals:   10/25/17 2154 10/26/17 0526  BP: 111/69 114/75  Pulse: 99 87  Resp: 18 18  Temp: 97.7 F (36.5 C) (!) 97.5 F (36.4 C)  SpO2: 100% 100%     Intake/Output from previous day:  04/27 0701 - 04/28 0700 In: 2350 [P.O.:600; I.V.:1200; IV Piggyback:550] Out: 775 [Urine:775]  Intake/Output this shift:  No intake/output data recorded.   Physical Exam:   General: WN AA F who is alert and oriented.    HEENT: Normal. Pupils equal. .   Lungs: Clear.   Abdomen: Soft   Wound: I changed both dressings.  The wounds are deep.   Lab Results:    Recent Labs    10/25/17 0456 10/26/17 0551  WBC 26.2* 12.2*  HGB 9.6*  9.0*  HCT 29.6* 28.8*  PLT 481* PLATELET CLUMPS NOTED ON SMEAR, COUNT APPEARS INCREASED    BMET   Recent Labs    10/25/17 0456 10/26/17 0551  NA 134* 138  K 2.8* 4.2  CL 91* 95*  CO2 27 32  GLUCOSE 212* 83  BUN 8 8  CREATININE 0.69 0.48  CALCIUM 8.9 9.2    PT/INR  No results for input(s): LABPROT, INR in the last 72 hours.  ABG  No results for input(s): PHART, HCO3 in the last 72 hours.  Invalid input(s): PCO2, PO2   Studies/Results:  Ct Pelvis W Contrast  Result Date: 10/24/2017 CLINICAL DATA:  LEFT groin abscess into LEFT thigh EXAM: CT PELVIS WITH CONTRAST TECHNIQUE: Multidetector CT imaging of the pelvis was performed using the standard protocol following the bolus administration of intravenous contrast. Sagittal and coronal MPR images reconstructed from axial data set. CONTRAST:  142mL ISOVUE-300 IOPAMIDOL (ISOVUE-300) INJECTION 61% IV COMPARISON:  CT pelvis 09/09/2017 FINDINGS: Urinary Tract: Suprapubic catheter. Inferior pole RIGHT kidney normal appearance.  Ureters and decompressed bladder otherwise unremarkable. Bowel: Prominent stool in rectum. Remaining visualized pelvic bowel loops unremarkable. Vascular/Lymphatic: Scattered atherosclerotic calcifications throughout the iliac systems and at distal aorta. Few scattered pelvic phleboliths. Reproductive: Uterus surgically absent. Normal sized ovaries bilaterally. Other: Small umbilical hernia containing fat. Irregular fluid collection with enhancing margins and septations identified in the medial proximal LEFT thigh, extending from anterior to the LEFT pubic body inferiorly to the level of the proximal to mid LEFT femoral diaphysis, collection overall measuring 9.0 x 5.8 x >11.1 cm; inferior extent is not imaged on this exam. Finding is consistent with an abscess. The collection with surrounding inflammation extends to abut the anterior aspect of the LEFT pubic body. No intrapelvic extension. Tissue planes surrounding the bladder  and rectum appear clear. Musculoskeletal: Diffuse osseous demineralization. Diffuse muscular atrophy. No bone destruction at the pubic body to suggest osteomyelitis though the inflammation extends to the anterior margin. Hip joints preserved. SI joints symmetric. IM nail proximal RIGHT femur. Calcification identified at the RIGHT ileo psoas muscle at the musculotendinous junction question sequela of prior trauma. Additionally, subcutaneous infiltration with minimal gas noted at the posterior inferior RIGHT buttock, extending towards the RIGHT ischium compatible with a decubitus ulcer; no drainable fluid collection seen. IMPRESSION: Multiloculated fluid collection with irregular enhancing margins and septations identified at the medial aspect of the upper LEFT thigh, extending from the LEFT pubic body to the level of the proximal to mid LEFT femoral diaphysis, 9.0 x 5.8 x 11.1 cm consistent with abscess. Inferior extent is not imaged; if further imaging is required distally, may consider MR or CT. Prominent stool in rectum. Small umbilical hernia containing fat. No intrapelvic abnormalities or intrapelvic extension of inflammatory process seen. Inferior RIGHT gluteal decubitus ulcer. Electronically Signed   By: Lavonia Dana M.D.   On: 10/24/2017 16:45     Anti-infectives:   Anti-infectives (From admission, onward)   Start     Dose/Rate Route Frequency Ordered Stop   10/25/17 2000  vancomycin (VANCOCIN) IVPB 750 mg/150 ml premix     750 mg 150 mL/hr over 60 Minutes Intravenous Every 24 hours 10/24/17 1934     10/25/17 0100  Ampicillin-Sulbactam (UNASYN) 3 g in sodium chloride 0.9 % 100 mL IVPB     3 g 200 mL/hr over 30 Minutes Intravenous Every 6 hours 10/24/17 1934     10/24/17 1945  vancomycin (VANCOCIN) 1,500 mg in sodium chloride 0.9 % 500 mL IVPB     1,500 mg 250 mL/hr over 120 Minutes Intravenous NOW 10/24/17 1934 10/24/17 2159   10/24/17 1930  piperacillin-tazobactam (ZOSYN) IVPB 3.375 g      3.375 g 100 mL/hr over 30 Minutes Intravenous  Once 10/24/17 1915 10/24/17 1857   10/24/17 1828  piperacillin-tazobactam (ZOSYN) 3.375 GM/50ML IVPB    Note to Pharmacy:  Eben Burow   : cabinet override      10/24/17 1828 10/24/17 1857   10/24/17 1808  clindamycin (CLEOCIN) 900 MG/50ML IVPB  Status:  Discontinued    Note to Pharmacy:  Marchia Meiers   : cabinet override      10/24/17 1808 10/24/17 2020   10/24/17 1730  clindamycin (CLEOCIN) IVPB 600 mg  Status:  Discontinued     600 mg 100 mL/hr over 30 Minutes Intravenous  Once 10/24/17 1726 10/24/17 1748      Alphonsa Overall, MD, FACS Pager: [facility] Office: [phone] 10/26/2017

## 2017-10-26 NOTE — Progress Notes (Signed)
OT Cancellation Note  Patient Details Name: Laura Roberts MRN: [id number] DOB: October 16, 1940   Cancelled Treatment:    Reason Eval/Treat Not Completed: OT screened, no needs identified, will sign off.  Pt resides in SNF.  She requires use of hoyer lift for transfers into her power chair, and has required use of lift "for years".  She has required total A for bathing, dressing, and toileting "for years".  Pt with plan to return to SNF.     Gracey, OTR/L [phone]   Lucille Passy M 10/26/2017, 12:24 PM

## 2017-10-26 NOTE — Anesthesia Postprocedure Evaluation (Signed)
Anesthesia Post Note  Patient: Laura Roberts  Procedure(s) Performed: IRRIGATION AND DEBRIDEMENT, LEFT THIGH ABCESS  (Left )     Patient location during evaluation: PACU Anesthesia Type: General Level of consciousness: awake and alert Pain management: pain level controlled Vital Signs Assessment: post-procedure vital signs reviewed and stable Respiratory status: spontaneous breathing, nonlabored ventilation, respiratory function stable and patient connected to nasal cannula oxygen Cardiovascular status: blood pressure returned to baseline and stable Postop Assessment: no apparent nausea or vomiting Anesthetic complications: no    Last Vitals:  Vitals:   10/25/17 2154 10/26/17 0526  BP: 111/69 114/75  Pulse: 99 87  Resp: 18 18  Temp: 36.5 C (!) 36.4 C  SpO2: 100% 100%    Last Pain:  Vitals:   10/26/17 0728  TempSrc:   PainSc: Asleep                 Catalina Gravel

## 2017-10-26 NOTE — Progress Notes (Signed)
PROGRESS NOTE    Laura Roberts  JXB:[id number] DOB: Jun 21, 1941 DOA: 10/24/2017 PCP: [name], MD   Brief Narrative:  Laura Roberts is a 77 y.o. female with medical history significant of multiple sclerosis, paraplegia, diabetes mellitus type 2 insulin-dependent, hypertension, spinal stenosis, neurogenic bladder, bilateral foot drop, history of complicated UTIs, history of sacral decubitus ulcer, as well as other comorbidities  who presented to worsening of emergency room with a chief complaint of proximal thigh leg pain.  She states that she woke up this morning described as very painful and states he was a 10 out of 10 which was constant, nonradiating and stated that it was aching pain. She stated it felt warm and swollen to the touch and it was near her vaginal area.  Says she is not able to fully see it because of her paraplegia and has very little to no feeling from the waist down.  She denies any shortness of breath,  nausea, vomiting, lightheadedness, dizziness, or chest pain.  States her muscles were aching.  TRH was called to admit for an extensive inguinal abscess with erythema. General surgery was consulted and took the patient to the OR for irrigation and debridement on 10/24/2017.  She is postoperative day 2 and slightly improved however still complains of pain.  Assessment & Plan:   Active Problems:   Multiple sclerosis (HCC)   Weakness   HTN (hypertension)   Tachycardia   GERD (gastroesophageal reflux disease)   Neurogenic bladder   Foot drop, bilateral   Paraplegia (HCC)   Decubitus ulcer of buttock, stage 4 (HCC)   Hypokalemia   Diabetes mellitus type 2, insulin dependent (HCC)   Hypomagnesemia   Inguinal abscess   SIRS (systemic inflammatory response syndrome) (HCC)  SIRS 2/2 left-sided Inguinal Abscess and Cellulitis s/p I&D POD2 -Admitted to Inpatient Telemetry -CT Scan of Pelvis showed "Multiloculated fluid collection with irregular enhancing margins and  septations identified at the medial aspect of the upper LEFT thigh, extending from the LEFT pubic body to the level of the proximal to mid LEFT femoral diaphysis, 9.0 x 5.8 x 11.1 cm consistent with abscess.Inferior extent is not imaged; if further imaging is required distally, may consider MR or CT." -Patient presented to [facility] also emergency room she was found to be tachypneic, tachycardic and a white blood cell count of 24.2 however she was afebrile and lactic acid level was normal at 1.66 -White blood cell count went from 26.2 and is now 12.2 -Given 2 L of IV fluid boluses and placed on 100 mL/hr of IV normal saline +40 mEq of potassium chloride but now reduced to half normal saline at 50 mL's per hour with 20 mEq of KCl; anticipate stopping patient's IV fluids in a.m. -Obtained blood cultures x2 and show no growth to date at 2 days -Continue with pain control with IV fentanyl 25 mcg every hour for severe pain and Oxycodone-Acetaminophen 1 to 2 tablets p.o. every 6 hours as needed for moderate pain -Surgery consulted for further evaluation and management and took the patient for incision and drainage 10/24/2017 -Placed on a Carb Modified Diet now that she is no longer n.p.o. -C/w Empiric antibiotics with IV vancomycin and IV Unasyn de-escalate accordingly; Continue to Monitor Renal Fxn while on Vancomycin and monitor for toxicity. -Gram stain on abscess culture showed abundant WBCs present predominantly PMNs.  There is also abundant gram-positive cocci in pairs as well as gram-negative rods and few gram-positive rods.  The aerobic/anaerobic culture  still pending as it was reincubated for better growth -General surgery patient is to start twice daily dressing changes and feels as if she needs deep packing -PT/OT evaluated and signed off as patient is bedridden and max assist at SNF  Hypokalemia -Patient's potassium on admission was 2.8 and now has improved to 4.2 -Replete with p.o. potassium  chloride 40 mg twice daily yesterday and now D/C'd -C/w Half-normal saline with 20 mEq of KCl IV fluid running down this evening -Continue to monitor and replete as necessary -Repeat CMP in a.m.  Hypomagnesemia -Patient's magnesium level 1.4 this AM improved to 1.9 -Replete with IV mag sulfate 2 g yesterday -Continue to monitor and replete as necessary -Repeat magnesium level in a.m.  Hyponatremia/Hypochloremia -Likely from dehydration -Continue IV fluid hydration as above -Sodium is now 139 and Chloride is now 95 -Repeat CMP in a.m.  Hypophosphatemia -Patient's phosphorus level was 2.4 this morning -Replete with p.o. K-Phos Neutral 500 mg tablet once -Continue to monitor and replete as necessary -Repeat phosphorus level in a.m.  Thrombocytosis -Likely reactive however patient's platelet count has been elevated for last 2 weeks. -Trending down.  Patient's platelet count went from 634 and is now 481; was not able to be calculated today as patient's platelets clumped on smear -Continue to monitor and repeat CBC in a.m.  Normocytic Anemia -Patient's Hemoglobin/Hematocrit went from 10.5/32.1 -> 9.6/29.6 -> 9.0/28.8 -Continue to monitor for signs and symptoms of bleeding -Repeat CBC in a.m.  Multiple Sclerosis with Paraplegia, bilateral foot drop, and Neurogenic bladder -Recent Admission to [facility] UTI.  Her catheter was changed out at that time -Continue with Baclofen, gabapentin, prednisone, Inderal LA -We will resume the patient's home [employer] for her MS -Continue with supportive care care and management  Essential Hypertension  -Currently Held Amlodipine 10 mg p.o. Nightly but will resume tonight  Diabetes MellitusType 2 insulin-dependent -Patient takes 25 units of Lantus at home however will place her on 20 units subcu nightly -Added moderate NovoLog sliding scale before meals and at bedtime -Checked Hemoglobin A1c and was 6.9  -Continue to  monitor CBGs closely; BPs have been ranging from 86-166  Sinus Tachycardia, improved -Likely pain mediate as well as dehydration -Continue with IV fluid hydration with half-normal saline at 50 mL's per hour +20 mg of KCl now and discontinue in a.m.  Sacral Decubitus Ulcer on Right -poA -Again Unable to turn patient to view due to pain -WOC Nurse Consult ordered and pending  -CT Scan showed Inferior Right Gluteal Decubitus Ulcer  DVT prophylaxis: Heparin 5000 units subcu every Code Status: DO NOT RESUSCITATE Family Communication: No family present at bedside  Disposition Plan: Anticipate discharge back to SNF in the next 24-48 hours if improving   Consultants:   General Surgery Dr. Lucia Gaskins    Procedures: Left Thigh Abscess Irrigation and Debridement 10/24/17   Antimicrobials:  Anti-infectives (From admission, onward)   Start     Dose/Rate Route Frequency Ordered Stop   10/25/17 2000  vancomycin (VANCOCIN) IVPB 750 mg/150 ml premix     750 mg 150 mL/hr over 60 Minutes Intravenous Every 24 hours 10/24/17 1934     10/25/17 0100  Ampicillin-Sulbactam (UNASYN) 3 g in sodium chloride 0.9 % 100 mL IVPB     3 g 200 mL/hr over 30 Minutes Intravenous Every 6 hours 10/24/17 1934     10/24/17 1945  vancomycin (VANCOCIN) 1,500 mg in sodium chloride 0.9 % 500 mL IVPB     1,500  mg 250 mL/hr over 120 Minutes Intravenous NOW 10/24/17 1934 10/24/17 2159   10/24/17 1930  piperacillin-tazobactam (ZOSYN) IVPB 3.375 g     3.375 g 100 mL/hr over 30 Minutes Intravenous  Once 10/24/17 1915 10/24/17 1857   10/24/17 1828  piperacillin-tazobactam (ZOSYN) 3.375 GM/50ML IVPB    Note to Pharmacy:  Eben Burow   : cabinet override      10/24/17 1828 10/24/17 1857   10/24/17 1808  clindamycin (CLEOCIN) 900 MG/50ML IVPB  Status:  Discontinued    Note to Pharmacy:  Marchia Meiers   : cabinet override      10/24/17 1808 10/24/17 2020   10/24/17 1730  clindamycin (CLEOCIN) IVPB 600 mg  Status:   Discontinued     600 mg 100 mL/hr over 30 Minutes Intravenous  Once 10/24/17 1726 10/24/17 1748     Subjective: Seen and examined and states pain is improved from a 10 out of 10 and is now a 5 out of 10 in severity.  Says that his open and draining.  Per nursing she appeared emotionless morning and was crying however when I went to the room she was not.  No chest pain, shortness breath, nausea, vomiting.  No lightheadedness or dizziness.  States that the pain is still there but is improved.  No other complaints or concerns at this time  Objective: Vitals:   10/25/17 0700 10/25/17 1636 10/25/17 2154 10/26/17 0526  BP:  113/72 111/69 114/75  Pulse:  (!) 111 99 87  Resp:  17 18 18   Temp:  (!) 97.5 F (36.4 C) 97.7 F (36.5 C) (!) 97.5 F (36.4 C)  TempSrc:   Oral Oral  SpO2:  100% 100% 100%  Weight: 80.3 kg (177 lb 0.5 oz)   81.4 kg (179 lb 7.3 oz)  Height:        Intake/Output Summary (Last 24 hours) at 10/26/2017 1358 Last data filed at 10/26/2017 0200 Gross per 24 hour  Intake 2110 ml  Output 775 ml  Net 1335 ml   Filed Weights   10/25/17 0500 10/25/17 0700 10/26/17 0526  Weight: 80.3 kg (177 lb 0.5 oz) 80.3 kg (177 lb 0.5 oz) 81.4 kg (179 lb 7.3 oz)   Examination: Physical Exam:  Constitutional: Well-nourished, well-developed obese African-American female in no acute distress.  Appears calm sitting up in bed. Eyes: Sclera are anicteric.  Lids and conjunctive are normal. ENMT: External ears and nose appear normal.  Grossly normal hearing.  Mucous membranes are moist Neck: Appears normal with no JVD. Respiratory: Diminished to auscultation bilaterally however there is no appreciable wheezing, rales, or rhonchi.  Patient has unlabored breathing is not wearing any supplemental oxygen via nasal cannula Cardiovascular: Regular rate and rhythm.  No appreciable murmurs, rubs or gallops.  No lower extremity edema noted Abdomen: Soft, lightly tender on the left side in the lower  quadrant.  Distended secondary to body habitus.  Bowel sounds present all 4 quadrants GU: Deferred.  Has a superb pubic catheter in place draining clear urine Musculoskeletal: Patient is paraplegic with a neurogenic bladder and does have bilateral lower extremity  foot drop Skin: Warm and dry.  Abscess: Proximal medial thigh near the inguinal area is improved and is not as warm.  There is open area draining.  Still unable to assess patient's sacral decubitus but is unable to return Neurologic: Cranial nerves II through XII grossly intact no focal deficits. Psychiatric: Normal judgment and insight.  Patient appears slightly depressed but has normal  affect.  Awake alert and oriented x3.  Data Reviewed: I have personally reviewed following labs and imaging studies  CBC: Recent Labs  Lab 10/24/17 1509 10/25/17 0456 10/26/17 0551  WBC 24.2* 26.2* 12.2*  NEUTROABS 21.5*  --  10.4*  HGB 10.5* 9.6* 9.0*  HCT 32.1* 29.6* 28.8*  MCV 78.9 79.4 80.7  PLT 634* 481* PLATELET CLUMPS NOTED ON SMEAR, COUNT APPEARS INCREASED   Basic Metabolic Panel: Recent Labs  Lab 10/24/17 1509 10/25/17 0456 10/25/17 0953 10/26/17 0551  NA 134* 134*  --  138  K 2.7* 2.8*  --  4.2  CL 88* 91*  --  95*  CO2 29 27  --  32  GLUCOSE 173* 212*  --  83  BUN 9 8  --  8  CREATININE 0.55 0.69  --  0.48  CALCIUM 9.4 8.9  --  9.2  MG 1.4*  --  1.4* 1.9  PHOS  --   --  3.1 2.4*   GFR: Estimated Creatinine Clearance: 61 mL/min (by C-G formula based on SCr of 0.48 mg/dL). Liver Function Tests: Recent Labs  Lab 10/25/17 0456 10/26/17 0551  AST 9* 15  ALT QUANTITY NOT SUFFICIENT, UNABLE TO PERFORM TEST 10*  ALKPHOS 76 71  BILITOT 0.9 0.6  PROT 5.7* 6.5  ALBUMIN 2.0* 2.2*   No results for input(s): LIPASE, AMYLASE in the last 168 hours. No results for input(s): AMMONIA in the last 168 hours. Coagulation Profile: No results for input(s): INR, PROTIME in the last 168 hours. Cardiac Enzymes: No results for  input(s): CKTOTAL, CKMB, CKMBINDEX, TROPONINI in the last 168 hours. BNP (last 3 results) No results for input(s): PROBNP in the last 8760 hours. HbA1C: Recent Labs    10/25/17 1714  HGBA1C 6.9*   CBG: Recent Labs  Lab 10/25/17 1228 10/25/17 1741 10/25/17 2148 10/26/17 0756 10/26/17 1200  GLUCAP 150* 141* 195* 86 166*   Lipid Profile: No results for input(s): CHOL, HDL, LDLCALC, TRIG, CHOLHDL, LDLDIRECT in the last 72 hours. Thyroid Function Tests: Recent Labs    10/24/17 2104  TSH 0.182*   Anemia Panel: No results for input(s): VITAMINB12, FOLATE, FERRITIN, TIBC, IRON, RETICCTPCT in the last 72 hours. Sepsis Labs: Recent Labs  Lab 10/24/17 1515  LATICACIDVEN 1.66   Recent Results (from the past 240 hour(s))  Aerobic/Anaerobic Culture (surgical/deep wound)     Status: None (Preliminary result)   Collection Time: 10/24/17  7:13 PM  Result Value Ref Range Status   Specimen Description   Final    ABSCESS LEFT THIGH Performed at [facility] [address], [place], [place] [address]    Special Requests   Final    NONE Performed at [facility], [place] [address] [place], [place] [address]    Gram Stain   Final    ABUNDANT WBC PRESENT, PREDOMINANTLY PMN ABUNDANT GRAM POSITIVE COCCI IN PAIRS ABUNDANT GRAM NEGATIVE RODS FEW GRAM POSITIVE RODS    Culture   Final    CULTURE REINCUBATED FOR BETTER GROWTH Performed at [facility], [place] [address], [place], [place] [address]    Report Status PENDING  Incomplete  Culture, blood (routine x 2)     Status: None (Preliminary result)   Collection Time: 10/24/17  9:04 PM  Result Value Ref Range Status   Specimen Description   Final    BLOOD RIGHT ARM Performed at [facility] [address] [place], [place] [address]  Special Requests   Final    BOTTLES DRAWN AEROBIC AND ANAEROBIC Blood Culture adequate volume Performed at [facility] [address], [place], [place] [address]    Culture   Final    NO GROWTH 2 DAYS Performed at [facility] [address] [place], [place] [address]    Report Status PENDING  Incomplete  Culture, blood (routine x 2)     Status: None (Preliminary result)   Collection Time: 10/24/17  9:04 PM  Result Value Ref Range Status   Specimen Description   Final    BLOOD RIGHT HAND Performed at [facility] [address], [place], [place] [address]    Special Requests   Final    BOTTLES DRAWN AEROBIC ONLY Blood Culture adequate volume Performed at [facility] [address], [place], [place] [address]    Culture   Final    NO GROWTH 2 DAYS Performed at [facility] [address] [place], [place] [address]    Report Status PENDING  Incomplete    Radiology Studies: Ct Pelvis W Contrast  Result Date: 10/24/2017 CLINICAL DATA:  LEFT groin abscess into LEFT thigh EXAM: CT PELVIS WITH CONTRAST TECHNIQUE: Multidetector CT imaging of the pelvis was performed using the standard protocol following the bolus administration of intravenous contrast. Sagittal and coronal MPR images reconstructed from axial data set. CONTRAST:  17mL ISOVUE-300 IOPAMIDOL (ISOVUE-300) INJECTION 61% IV COMPARISON:  CT pelvis 09/09/2017 FINDINGS: Urinary Tract: Suprapubic catheter. Inferior pole RIGHT kidney normal appearance. Ureters and decompressed bladder otherwise unremarkable. Bowel: Prominent stool in rectum. Remaining visualized pelvic bowel loops unremarkable. Vascular/Lymphatic: Scattered atherosclerotic calcifications throughout the iliac systems and at distal aorta. Few scattered pelvic phleboliths. Reproductive: Uterus surgically absent. Normal sized ovaries bilaterally. Other: Small umbilical hernia containing fat. Irregular fluid collection with enhancing margins and septations identified in the medial proximal LEFT thigh, extending from anterior to  the LEFT pubic body inferiorly to the level of the proximal to mid LEFT femoral diaphysis, collection overall measuring 9.0 x 5.8 x >11.1 cm; inferior extent is not imaged on this exam. Finding is consistent with an abscess. The collection with surrounding inflammation extends to abut the anterior aspect of the LEFT pubic body. No intrapelvic extension. Tissue planes surrounding the bladder and rectum appear clear. Musculoskeletal: Diffuse osseous demineralization. Diffuse muscular atrophy. No bone destruction at the pubic body to suggest osteomyelitis though the inflammation extends to the anterior margin. Hip joints preserved. SI joints symmetric. IM nail proximal RIGHT femur. Calcification identified at the RIGHT ileo psoas muscle at the musculotendinous junction question sequela of prior trauma. Additionally, subcutaneous infiltration with minimal gas noted at the posterior inferior RIGHT buttock, extending towards the RIGHT ischium compatible with a decubitus ulcer; no drainable fluid collection seen. IMPRESSION: Multiloculated fluid collection with irregular enhancing margins and septations identified at the medial aspect of the upper LEFT thigh, extending from the LEFT pubic body to the level of the proximal to mid LEFT femoral diaphysis, 9.0 x 5.8 x 11.1 cm consistent with abscess. Inferior extent is not imaged; if further imaging is required distally, may consider MR or CT. Prominent stool in rectum. Small umbilical hernia containing fat. No intrapelvic abnormalities or intrapelvic extension of inflammatory process seen. Inferior RIGHT gluteal decubitus ulcer. Electronically Signed   By: Lavonia Dana M.D.   On: 10/24/2017 16:45   Scheduled Meds: . acetaminophen  650 mg Oral TID  . baclofen  15  mg Oral QAC breakfast   And  . baclofen  10 mg Oral Q24H  . baclofen  20 mg Oral QHS  . calcium citrate  200 mg of elemental calcium Oral Daily  . cholecalciferol  1,000 Units Oral Daily  . Dimethyl Fumarate   240 mg Oral BID  . DULoxetine  90 mg Oral Daily  . feeding supplement (PRO-STAT SUGAR FREE 64)  30 mL Oral BID  . fesoterodine  4 mg Oral Daily  . gabapentin  300 mg Oral BID  . heparin injection (subcutaneous)  5,000 Units Subcutaneous Q8H  . insulin aspart  0-15 Units Subcutaneous TID WC  . insulin aspart  0-5 Units Subcutaneous QHS  . insulin glargine  20 Units Subcutaneous QHS  . nutrition supplement (JUVEN)  1 packet Oral BID BM  . pantoprazole  40 mg Oral Daily  . predniSONE  2.5-5 mg Oral See admin instructions  . senna-docusate  2 tablet Oral QHS   Continuous Infusions: . 0.45 % NaCl with KCl 20 mEq / L 50 mL/hr at 10/26/17 0119  . ampicillin-sulbactam (UNASYN) IV 3 g (10/26/17 6122)  . magnesium sulfate    . sodium chloride    . vancomycin Stopped (10/25/17 2140)    LOS: 2 days   Kerney Elbe, DO [facility] Pager [phone]  If 7PM-7AM, please contact night-coverage [email or web address] Password [email or web address] 10/26/2017, 1:58 PM

## 2017-10-26 NOTE — Progress Notes (Signed)
Pt had some very emotional moments during this shift. Stated that she was tired. Allowed her to vent and cry. Provided emotional support. Pt has had visitors throughout the day.    Dressing changed per MD request this am. Wound with granulated  tissue. No odor noted at this time.   Cont with plan of care. Report given to oncoming RN

## 2017-10-26 NOTE — Progress Notes (Signed)
Initial Nutrition Assessment  DOCUMENTATION CODES:   Obesity unspecified  INTERVENTION:   -Continue Prostat liquid protein PO 30 ml BID with meals, each supplement provides 100 kcal, 15 grams protein. -Provide Juven Fruit Punch BID, each serving provides 95kcal and 2.5g of protein (amino acids glutamine and arginine)  NUTRITION DIAGNOSIS:   Increased nutrient needs related to wound healing as evidenced by estimated needs.  GOAL:   Patient will meet greater than or equal to 90% of their needs  MONITOR:   PO intake, Supplement acceptance, Labs, Weight trends, I & O's, Skin  REASON FOR ASSESSMENT:   Malnutrition Screening Tool    ASSESSMENT:   77 y.o. female with medical history significant of multiple sclerosis, paraplegia, diabetes mellitus type 2 insulin-dependent, hypertension, spinal stenosis, neurogenic bladder, bilateral foot drop, history of complicated UTIs, history of sacral decubitus ulcer, as well as other comorbidities  who presented to worsening of emergency room with a chief complaint of proximal thigh leg pain.   Patient is s/p I&D of left thigh abscess on 4/26. Pt with several other wounds as well. Currently consuming 60% of meals. Will continue order for Prostat and will add Juven supplements to aid in wound healing.  Per chart review, pt's weight has trended down but pt weighed 176-178 lb in early 2018.   Medications: Calcitrate daily, Vitamin D tablet daily  Labs reviewed: CBGs: 86-166 Low Phos Mg WNL   NUTRITION - FOCUSED PHYSICAL EXAM:  Nutrition focused physical exam shows no sign of depletion of muscle mass or body fat.  Diet Order:  Diet Carb Modified Fluid consistency: Thin; Room service appropriate? Yes  EDUCATION NEEDS:   No education needs have been identified at this time  Skin:  Skin Assessment: Skin Integrity Issues: Skin Integrity Issues:: Stage II, Unstageable Stage II: buttocks, sacrum Unstageable: buttocks, rt heel  Last BM:   4/27  Height:   Ht Readings from Last 1 Encounters:  10/24/17 5\' 2"  (1.575 m)    Weight:   Wt Readings from Last 1 Encounters:  10/26/17 179 lb 7.3 oz (81.4 kg)    Ideal Body Weight:  50 kg  BMI:  Body mass index is 32.82 kg/m.  Estimated Nutritional Needs:   Kcal:  1900-2100  Protein:  80-90g  Fluid:  2L/day  Clayton Bibles, MS, RD, LDN [facility] Pager: [phone] After Hours Pager: [phone]

## 2017-10-27 ENCOUNTER — Encounter (HOSPITAL_COMMUNITY): Payer: Self-pay | Admitting: Surgery

## 2017-10-27 LAB — GLUCOSE, CAPILLARY
GLUCOSE-CAPILLARY: 139 mg/dL — AB (ref 65–99)
GLUCOSE-CAPILLARY: 152 mg/dL — AB (ref 65–99)
Glucose-Capillary: 79 mg/dL (ref 65–99)
Glucose-Capillary: 75 mg/dL (ref 65–99)

## 2017-10-27 LAB — CBC WITH DIFFERENTIAL/PLATELET
Basophils Absolute: 0 10*3/uL (ref 0.0–0.1)
Basophils Relative: 0 %
EOS ABS: 0.2 10*3/uL (ref 0.0–0.7)
EOS PCT: 3 %
HCT: 29.9 % — ABNORMAL LOW (ref 36.0–46.0)
HEMOGLOBIN: 9.4 g/dL — AB (ref 12.0–15.0)
LYMPHS PCT: 21 %
Lymphs Abs: 1.8 10*3/uL (ref 0.7–4.0)
MCH: 25.5 pg — ABNORMAL LOW (ref 26.0–34.0)
MCHC: 31.4 g/dL (ref 30.0–36.0)
MCV: 81.3 fL (ref 78.0–100.0)
Monocytes Absolute: 0.6 10*3/uL (ref 0.1–1.0)
Monocytes Relative: 7 %
NEUTROS PCT: 69 %
Neutro Abs: 6.3 10*3/uL (ref 1.7–7.7)
PLATELETS: 701 10*3/uL — AB (ref 150–400)
RBC: 3.68 MIL/uL — AB (ref 3.87–5.11)
RDW: 19.3 % — ABNORMAL HIGH (ref 11.5–15.5)
WBC: 8.9 10*3/uL (ref 4.0–10.5)

## 2017-10-27 LAB — COMPREHENSIVE METABOLIC PANEL
ALT: 9 U/L — AB (ref 14–54)
AST: 13 U/L — AB (ref 15–41)
Albumin: 2.2 g/dL — ABNORMAL LOW (ref 3.5–5.0)
Alkaline Phosphatase: 70 U/L (ref 38–126)
Anion gap: 10 (ref 5–15)
BUN: 13 mg/dL (ref 6–20)
CHLORIDE: 97 mmol/L — AB (ref 101–111)
CO2: 33 mmol/L — AB (ref 22–32)
CREATININE: 0.54 mg/dL (ref 0.44–1.00)
Calcium: 9.1 mg/dL (ref 8.9–10.3)
GFR calc Af Amer: >60 mL/min (ref >60)
GFR calc non Af Amer: >60 mL/min (ref >60)
Glucose, Bld: 96 mg/dL (ref 65–99)
Potassium: 3.7 mmol/L (ref 3.5–5.1)
SODIUM: 140 mmol/L (ref 135–145)
Total Bilirubin: 0.4 mg/dL (ref 0.3–1.2)
Total Protein: 6.1 g/dL — ABNORMAL LOW (ref 6.5–8.1)

## 2017-10-27 LAB — PHOSPHORUS: Phosphorus: 3.2 mg/dL (ref 2.5–4.6)

## 2017-10-27 LAB — MAGNESIUM: Magnesium: 1.7 mg/dL (ref 1.7–2.4)

## 2017-10-27 MED ORDER — COLLAGENASE 250 UNIT/GM EX OINT
TOPICAL_OINTMENT | Freq: Every day | CUTANEOUS | Status: DC
Start: 2017-10-27 — End: 2017-10-29
  Administered 2017-10-27 – 2017-10-29 (×3): via TOPICAL
  Filled 2017-10-27: qty 30
  Filled 2017-10-27: qty 90

## 2017-10-27 NOTE — Care Management Note (Signed)
Case Management Note  Patient Details  Name: Laura Roberts MRN: [id number] Date of Birth: [birth year]  Subjective/Objective:  77 y/o f admitted w/Inguinal abscess. From SNF. Paraplegic,DNR. Per CSW recc PT cons-await recc.                   Action/Plan:d/c return SNF.   Expected Discharge Date:                  Expected Discharge Plan:  Skilled Nursing Facility  In-House Referral:  Clinical Social Work  Discharge planning Services  CM Consult  Post Acute Care Choice:    Choice offered to:     DME Arranged:    DME Agency:     HH Arranged:    HH Agency:     Status of Service:     If discussed at [employer], dates discussed:    Additional Comments:  Dessa Phi, RN 10/27/2017, 12:40 PM

## 2017-10-27 NOTE — Consult Note (Addendum)
[facility] Nurse wound consult note Reason for Consult: Surgical team following for assessment and plan of care to groin wound; please refer to their team for further questions.   WOC requested to assess right ischium, sacrum and right heel.  These wounds were all noted as present on admission. Wound type: Sacrum with previously noted stage 2 pressure injury has evolved into stage 3; 2X.3X.2cm, red and moist, small amt tan drainage, no odor Bilat buttocks with patchy areas of pink moist partial thickness wounds; appearance consistent with moisture associated skin damage. Right ischium with previously noted unstageable wound is now yellow dry tightly adhered scar tissue/scabbed area, approx 1X1cm, no open wound, drainage, or odor. Right heel with previously noted dry eschar, now lifting at edges and slightly moist unstageable pressure injury; 1X1cm, no odor, drainage, or fluctuance. Pressure Injury POA: Yes Dressing procedure/placement/frequency: Float heels to reduce pressure. Aquacel to absorb drainage and promote healing to sacrum wound, foam dressing to protect. Barrier cream to protect and promote healing to right ischium and bilat buttocks Santyl for enzymatic debridement of nonviable tissue to right heel.  Discussed plan of care with patient.  Please re-consult if further assistance is needed.  Thank-you,  Julien Girt MSN, [place], [place], [place], [place]

## 2017-10-27 NOTE — Progress Notes (Signed)
PROGRESS NOTE    Laura Roberts  GYI:[id number] DOB: 1941-04-29 DOA: 10/24/2017 PCP: [name], MD   Brief Narrative:  Laura Roberts is a 77 y.o. female with medical history significant of multiple sclerosis, paraplegia, diabetes mellitus type 2 insulin-dependent, hypertension, spinal stenosis, neurogenic bladder, bilateral foot drop, history of complicated UTIs, history of sacral decubitus ulcer, as well as other comorbidities  who presented to worsening of emergency room with a chief complaint of proximal thigh leg pain.  She states that she woke up this morning described as very painful and states he was a 10 out of 10 which was constant, nonradiating and stated that it was aching pain. She stated it felt warm and swollen to the touch and it was near her vaginal area.  Says she is not able to fully see it because of her paraplegia and has very little to no feeling from the waist down.  She denies any shortness of breath,  nausea, vomiting, lightheadedness, dizziness, or chest pain.  States her muscles were aching.  [facility] was called to admit for an extensive inguinal abscess with erythema. General surgery was consulted and took the patient to the OR for irrigation and debridement on 10/24/2017.  She is postoperative day 3 and slightly improved however still complains of pain and today it was a 7 out of 10.  Assessment & Plan:   Active Problems:   Multiple sclerosis (HCC)   Weakness   HTN (hypertension)   Tachycardia   GERD (gastroesophageal reflux disease)   Neurogenic bladder   Foot drop, bilateral   Paraplegia (HCC)   Decubitus ulcer of buttock, stage 4 (HCC)   Hypokalemia   Diabetes mellitus type 2, insulin dependent (HCC)   Hypomagnesemia   Inguinal abscess   SIRS (systemic inflammatory response syndrome) (HCC)  SIRS 2/2 left-sided Inguinal Abscess and Cellulitis s/p I&D POD3 -Admitted to Inpatient Telemetry -CT Scan of Pelvis showed "Multiloculated fluid collection with  irregular enhancing margins and septations identified at the medial aspect of the upper LEFT thigh, extending from the LEFT pubic body to the level of the proximal to mid LEFT femoral diaphysis, 9.0 x 5.8 x 11.1 cm consistent with abscess.Inferior extent is not imaged; if further imaging is required distally, may consider MR or CT." -Patient presented to [facility] also emergency room she was found to be tachypneic, tachycardic and a white blood cell count of 24.2 however she was afebrile and lactic acid level was normal at 1.66 -White blood cell count went from 26.2 and is now 8.9 -Given 2 L of IV fluid boluses and placed on 100 mL/hr of IV normal saline +40 mEq of potassium chloride but was reduced to half normal saline at 50 mL's per hour with 20 mEq of KCl and now stopped this morning -Obtained blood cultures x2 and show no growth to date at 2 days -Continue with pain control with IV fentanyl 25 mcg every hour for severe pain and Oxycodone-Acetaminophen 1 to 2 tablets p.o. every 6 hours as needed for moderate pain -Surgery consulted for further evaluation and management and took the patient for incision and drainage 10/24/2017 -Placed on a Carb Modified Diet now that she is no longer n.p.o. -C/w Empiric antibiotics with IV vancomycin and IV Unasyn de-escalate accordingly; Continue to Monitor Renal Fxn while on Vancomycin and monitor for toxicity. -Gram stain on abscess culture showed abundant WBCs present predominantly PMNs.  There is also abundant gram-positive cocci in pairs as well as gram-negative rods and  few gram-positive rods.  The aerobic/anaerobic culture still pending as it was reincubated for better growth and is still pending at this time -General surgery patient is to start twice daily dressing changes and feels as if she needs deep packing -PT/OT evaluated and signed off as patient is bedridden and max assist at SNF -Once sensitivities are resulted patient will be transitioned to p.o. and  will be discharged back to skilled nursing facility  Hypokalemia -Patient's potassium on admission was 2.8 and now has improved to 3.7 -Discontinued half-normal saline with 20 mEq of KCl IV fluid running down this evening -Continue to monitor and replete as necessary -Repeat CMP in a.m.  Hypomagnesemia -Patient's magnesium level 1.4 this AM improved to 1.7 -Continue to monitor and replete as necessary -Repeat magnesium level in a.m.  Hyponatremia/Hypochloremia -Likely from dehydration -Discontinued IV fluid hydration as above -Sodium is now 139 and Chloride is now 97 -Repeat CMP in a.m.  Hypophosphatemia -Patient's phosphorus level was 3.2 this morning -Continue to monitor and replete as necessary -Repeat phosphorus level in a.m.  Thrombocytosis -Likely reactive however patient's platelet count has been elevated for last 2 weeks. -Initially was trending down.  Patient's platelet count went from 634 -> 481 -> 701 -Will Discuss with  -Continue to monitor and repeat CBC in a.m.  Normocytic Anemia -Patient's Hemoglobin/Hematocrit went from 10.5/32.1 -> 9.6/29.6 -> 9.0/28.8 -> 9.4/29.9 -Continue to monitor for signs and symptoms of bleeding -Repeat CBC in a.m.  Multiple Sclerosis with Paraplegia, bilateral foot drop, and Neurogenic bladder -Recent Admission to [facility] For complicated UTI.  Her catheter was changed out at that time -Continue with Baclofen, gabapentin, prednisone, Inderal LA -We will resume the patient's home [employer] for her MS -Continue with supportive care care and management  Essential Hypertension  -Continue Amlodipine 10 mg p.o. Nightly   Diabetes MellitusType 2 insulin-dependent -Patient takes 25 units of Lantus at home however will place her on 20 units subcu nightly -Added moderate NovoLog sliding scale before meals and at bedtime -Checked Hemoglobin A1c and was 6.9  -Continue to monitor CBGs closely; BPs have been ranging from  79-197  Sinus Tachycardia, improved -Likely pain mediated as well as dehydration -Heart rate was 85 and 1 -Discontinued with IV fluid hydration with half-normal saline at 50 mL's per hour +20 mg of KCl now and discontinue in a.m.  Sacral Decubitus Ulcer on Right  And Right heel ulcer -poA -WOC Nurse Consult and recommending barrier cream to protect and promote healing to the right ischium and bilateral buttocks and she is recommended Santyl for in the medical debridement of nonviable tissue of the right heel -CT Scan showed Inferior Right Gluteal Decubitus Ulcer  DVT prophylaxis: Heparin 5000 units subcu every Code Status: DO NOT RESUSCITATE Family Communication: No family present at bedside  Disposition Plan: Anticipate discharge back to SNF in the next 24-48 hours if improving   Consultants:   General Surgery Dr. Lucia Gaskins    Procedures: Left Thigh Abscess Irrigation and Debridement 10/24/17   Antimicrobials:  Anti-infectives (From admission, onward)   Start     Dose/Rate Route Frequency Ordered Stop   10/25/17 2000  vancomycin (VANCOCIN) IVPB 750 mg/150 ml premix     750 mg 150 mL/hr over 60 Minutes Intravenous Every 24 hours 10/24/17 1934     10/25/17 0100  Ampicillin-Sulbactam (UNASYN) 3 g in sodium chloride 0.9 % 100 mL IVPB     3 g 200 mL/hr over 30 Minutes Intravenous Every 6 hours 10/24/17 1934  10/24/17 1945  vancomycin (VANCOCIN) 1,500 mg in sodium chloride 0.9 % 500 mL IVPB     1,500 mg 250 mL/hr over 120 Minutes Intravenous NOW 10/24/17 1934 10/24/17 2159   10/24/17 1930  piperacillin-tazobactam (ZOSYN) IVPB 3.375 g     3.375 g 100 mL/hr over 30 Minutes Intravenous  Once 10/24/17 1915 10/24/17 1857   10/24/17 1828  piperacillin-tazobactam (ZOSYN) 3.375 GM/50ML IVPB    Note to Pharmacy:  Eben Burow   : cabinet override      10/24/17 1828 10/24/17 1857   10/24/17 1808  clindamycin (CLEOCIN) 900 MG/50ML IVPB  Status:  Discontinued    Note to Pharmacy:   Marchia Meiers   : cabinet override      10/24/17 1808 10/24/17 2020   10/24/17 1730  clindamycin (CLEOCIN) IVPB 600 mg  Status:  Discontinued     600 mg 100 mL/hr over 30 Minutes Intravenous  Once 10/24/17 1726 10/24/17 1748     Subjective: Seen and examined and states her pain level is a 7 out of 10 today.  Felt frustrated but denies any chest pain, shortness of breath, nausea, vomiting.  General surgery following and recommending deep packing and twice daily dressing changes wet-to-dry.  Cultures are still pending and once they result will be discharged back to skilled nursing facility.  Objective: Vitals:   10/26/17 0526 10/26/17 1436 10/26/17 2110 10/27/17 0416  BP: 114/75 118/75 119/63 117/75  Pulse: 87 (!) 102 93 85  Resp: 18 20 18 20   Temp: (!) 97.5 F (36.4 C) 98.1 F (36.7 C) 97.8 F (36.6 C) 97.6 F (36.4 C)  TempSrc: Oral Oral Oral Oral  SpO2: 100% 100% 98% 100%  Weight: 81.4 kg (179 lb 7.3 oz)   83.2 kg (183 lb 6.8 oz)  Height:        Intake/Output Summary (Last 24 hours) at 10/27/2017 1144 Last data filed at 10/27/2017 0400 Gross per 24 hour  Intake 780 ml  Output 800 ml  Net -20 ml   Filed Weights   10/25/17 0700 10/26/17 0526 10/27/17 0416  Weight: 80.3 kg (177 lb 0.5 oz) 81.4 kg (179 lb 7.3 oz) 83.2 kg (183 lb 6.8 oz)   Examination: Physical Exam:  Constitutional: Well-nourished, well-developed obese African-American female who is currently in no acute distress.  She appears calm sitting up in bed however she is complaining of some pain today Eyes: Sclera are anicteric.  Lids and conjunctive are normal. ENMT: External ears and nose appear normal.  Grossly normal hearing.  Mucous membranes are moist Neck: Supple with no JVD Respiratory: Diminished to auscultation bilaterally.  There is no appreciable wheezing, rales, rhonchi.  Unlabored breathing and is not wearing any supplemental oxygen via nasal cannula Cardiovascular: Regular rate and rhythm.  No  appreciable murmurs, rubs, gallops.  No lower extremity edema noted Abdomen: Slightly tender on the left side.  Distended secondary to body habitus. Bowel sounds present all 4 quadrants GU: Deferred. Suprapubic catheter in place soft, Musculoskeletal: Patient is paraplegic with a neurogenic bladder and does have bilateral lower extremity foot drop Skin: Warm and dry. Proximal medial thigh is open and draining some. Has a sacral decubitus ulcer as well as right heel ulcer Neurologic: Cranial nerves II through XII grossly intact no appreciable focal deficits. Has bilateral foot drop Psychiatric: Normal judgment and insight.  Patient appears slightly depressed but has normal affect.  Awake and alert and oriented x3  Data Reviewed: I have personally reviewed following labs and imaging  studies  CBC: Recent Labs  Lab 10/24/17 1509 10/25/17 0456 10/26/17 0551 10/27/17 0536  WBC 24.2* 26.2* 12.2* 8.9  NEUTROABS 21.5*  --  10.4* 6.3  HGB 10.5* 9.6* 9.0* 9.4*  HCT 32.1* 29.6* 28.8* 29.9*  MCV 78.9 79.4 80.7 81.3  PLT 634* 481* PLATELET CLUMPS NOTED ON SMEAR, COUNT APPEARS INCREASED 220*   Basic Metabolic Panel: Recent Labs  Lab 10/24/17 1509 10/25/17 0456 10/25/17 0953 10/26/17 0551 10/27/17 0536  NA 134* 134*  --  138 140  K 2.7* 2.8*  --  4.2 3.7  CL 88* 91*  --  95* 97*  CO2 29 27  --  32 33*  GLUCOSE 173* 212*  --  83 96  BUN 9 8  --  8 13  CREATININE 0.55 0.69  --  0.48 0.54  CALCIUM 9.4 8.9  --  9.2 9.1  MG 1.4*  --  1.4* 1.9 1.7  PHOS  --   --  3.1 2.4* 3.2   GFR: Estimated Creatinine Clearance: 61.7 mL/min (by C-G formula based on SCr of 0.54 mg/dL). Liver Function Tests: Recent Labs  Lab 10/25/17 0456 10/26/17 0551 10/27/17 0536  AST 9* 15 13*  ALT QUANTITY NOT SUFFICIENT, UNABLE TO PERFORM TEST 10* 9*  ALKPHOS 76 71 70  BILITOT 0.9 0.6 0.4  PROT 5.7* 6.5 6.1*  ALBUMIN 2.0* 2.2* 2.2*   No results for input(s): LIPASE, AMYLASE in the last 168 hours. No results  for input(s): AMMONIA in the last 168 hours. Coagulation Profile: No results for input(s): INR, PROTIME in the last 168 hours. Cardiac Enzymes: No results for input(s): CKTOTAL, CKMB, CKMBINDEX, TROPONINI in the last 168 hours. BNP (last 3 results) No results for input(s): PROBNP in the last 8760 hours. HbA1C: Recent Labs    10/25/17 1714  HGBA1C 6.9*   CBG: Recent Labs  Lab 10/26/17 0756 10/26/17 1200 10/26/17 1629 10/26/17 2101 10/27/17 0752  GLUCAP 86 166* 137* 197* 79   Lipid Profile: No results for input(s): CHOL, HDL, LDLCALC, TRIG, CHOLHDL, LDLDIRECT in the last 72 hours. Thyroid Function Tests: Recent Labs    10/24/17 2104  TSH 0.182*   Anemia Panel: No results for input(s): VITAMINB12, FOLATE, FERRITIN, TIBC, IRON, RETICCTPCT in the last 72 hours. Sepsis Labs: Recent Labs  Lab 10/24/17 1515  LATICACIDVEN 1.66   Recent Results (from the past 240 hour(s))  Aerobic/Anaerobic Culture (surgical/deep wound)     Status: None (Preliminary result)   Collection Time: 10/24/17  7:13 PM  Result Value Ref Range Status   Specimen Description   Final    ABSCESS LEFT THIGH Performed at [facility] [address] [place], [place] [address]    Special Requests   Final    NONE Performed at [facility], [place] [address], [place], [place] [address]    Gram Stain   Final    ABUNDANT WBC PRESENT, PREDOMINANTLY PMN ABUNDANT GRAM POSITIVE COCCI IN PAIRS ABUNDANT GRAM NEGATIVE RODS FEW GRAM POSITIVE RODS    Culture   Final    CULTURE REINCUBATED FOR BETTER GROWTH Performed at [facility], [place] [address], [place], [place] [address]    Report Status PENDING  Incomplete  Culture, blood (routine x 2)     Status: None (Preliminary result)   Collection Time: 10/24/17  9:04 PM  Result Value Ref Range Status   Specimen Description   Final    BLOOD RIGHT ARM Performed at [facility],  [place] [address]  [place], [place] [address]    Special Requests   Final    BOTTLES DRAWN AEROBIC AND ANAEROBIC Blood Culture adequate volume Performed at [facility] [address], [place], [place] [address]    Culture   Final    NO GROWTH 2 DAYS Performed at [facility] [address], [place], [place] [address]    Report Status PENDING  Incomplete  Culture, blood (routine x 2)     Status: None (Preliminary result)   Collection Time: 10/24/17  9:04 PM  Result Value Ref Range Status   Specimen Description   Final    BLOOD RIGHT HAND Performed at [facility] [address], [place], [place] [address]    Special Requests   Final    BOTTLES DRAWN AEROBIC ONLY Blood Culture adequate volume Performed at [facility] [address] [place], [place] [address]    Culture   Final    NO GROWTH 2 DAYS Performed at [facility] [address], [place], [place] [address]    Report Status PENDING  Incomplete  Urine culture     Status: None   Collection Time: 10/25/17  5:14 AM  Result Value Ref Range Status   Specimen Description   Final    URINE, RANDOM Performed at [facility] [address], [place], [place] [address]    Special Requests   Final    NONE Performed at [facility], [place] [address] [place], [place] [address]    Culture   Final    NO GROWTH Performed at [facility], [place] [address], [place], [place] [address]    Report Status 10/26/2017 FINAL  Final    Radiology Studies: No results found. Scheduled Meds: . acetaminophen  650 mg Oral TID  . amLODipine  10 mg Oral QHS  . baclofen  15 mg Oral QAC breakfast   And  . baclofen  10 mg Oral Q24H  . baclofen  20 mg Oral QHS  . calcium citrate  200 mg of elemental calcium Oral Daily  . cholecalciferol  1,000 Units Oral Daily  . collagenase   Topical Daily  . Dimethyl Fumarate  240 mg Oral BID  . DULoxetine  90 mg Oral  Daily  . feeding supplement (PRO-STAT SUGAR FREE 64)  30 mL Oral BID  . fesoterodine  4 mg Oral Daily  . gabapentin  300 mg Oral BID  . heparin injection (subcutaneous)  5,000 Units Subcutaneous Q8H  . insulin aspart  0-15 Units Subcutaneous TID WC  . insulin aspart  0-5 Units Subcutaneous QHS  . insulin glargine  20 Units Subcutaneous QHS  . nutrition supplement (JUVEN)  1 packet Oral BID BM  . pantoprazole  40 mg Oral Daily  . [START ON 10/28/2017] predniSONE  2.5 mg Oral Q48H  . predniSONE  5 mg Oral Q48H  . senna-docusate  2 tablet Oral QHS   Continuous Infusions: . 0.45 % NaCl with KCl 20 mEq / L 50 mL/hr at 10/26/17 2310  . ampicillin-sulbactam (UNASYN) IV Stopped (10/27/17 5188)  . magnesium sulfate    . sodium chloride    . vancomycin Stopped (10/26/17 2157)    LOS: 3 days   Kerney Elbe, DO [facility] Pager [phone]  If 7PM-7AM, please contact night-coverage [email or web address] Password [facility] 10/27/2017, 11:44 AM

## 2017-10-27 NOTE — Plan of Care (Signed)
  Problem: Education: Goal: Knowledge of General Education information will improve Outcome: Progressing   Problem: Health Behavior/Discharge Planning: Goal: Ability to manage health-related needs will improve Outcome: Progressing   Problem: Nutrition: Goal: Adequate nutrition will be maintained Outcome: Progressing   Problem: Pain Managment: Goal: General experience of comfort will improve Outcome: Progressing   

## 2017-10-27 NOTE — Care Management Important Message (Signed)
Important Message  Patient Details  Name: SHAMEKIA TIPPETS MRN: [id number] Date of Birth: [birth year]   Medicare Important Message Given:  Yes    Kerin Salen 10/27/2017, 11:17 AMImportant Message  Patient Details  Name: SAREENA ODEH MRN: [id number] Date of Birth: Dec 10, 1940   Medicare Important Message Given:  Yes    Kerin Salen 10/27/2017, 11:17 AM

## 2017-10-27 NOTE — Progress Notes (Signed)
[facility] Office:  [phone] General Surgery Progress Note   LOS: 3 days  POD -  3 Days Post-Op  Chief Complaint: Left groin/thigh pain  Assessment and Plan: 1.  IRRIGATION AND DEBRIDEMENT, LEFT THIGH ABCESS - 10/24/2017 - D. Newman  WBC - 8.9 from 12.2  Unasyn/Vancomycin - 2/26 >>>  BID dressing changes wet to dry - wounds okay, just need deep packing   2.  Multiple sclerosis             X 12 years - followed by Dr. Jannifer Franklin             Now bedridden.  She cannot stand or walk 3.  DM             X 5 years.  On insulin over the last month. 4.  Obesity 5.  Suprapubic catheter             For neurogenic bladder 6.  2 x 4 cm sacral decubiti 7.  HTN 8.  On chronic low dose prednisone 9.  Anemia   Hgb - 9.0 - 10/26/2017 10.  DVT prophylaxis - SQH, SCDs   Active Problems:   Multiple sclerosis (HCC)   Weakness   HTN (hypertension)   Tachycardia   GERD (gastroesophageal reflux disease)   Neurogenic bladder   Foot drop, bilateral   Paraplegia (HCC)   Decubitus ulcer of buttock, stage 4 (HCC)   Hypokalemia   Diabetes mellitus type 2, insulin dependent (HCC)   Hypomagnesemia   Inguinal abscess   SIRS (systemic inflammatory response syndrome) (HCC)   Subjective:  Still has left thigh pain but states much better than yesterday.  Tolerating diet without n/v  Objective:   Vitals:   10/26/17 2110 10/27/17 0416  BP: 119/63 117/75  Pulse: 93 85  Resp: 18 20  Temp: 97.8 F (36.6 C) 97.6 F (36.4 C)  SpO2: 98% 100%     Intake/Output from previous day:  04/28 0701 - 04/29 0700 In: 980 [P.O.:480; I.V.:400; IV Piggyback:100] Out: 800 [Urine:800]  Intake/Output this shift:  No intake/output data recorded.   Physical Exam:   General: WN AA F who is alert and oriented on exam   HEENT: Normal. Pupils equal. .   Lungs: Clear.   Abdomen: Soft   Wound: I changed both dressings.  The wounds are deep but with clean margins and base. No surrounding erythema.  No purulent drainage.   Lab Results:    Recent Labs    10/26/17 0551 10/27/17 0536  WBC 12.2* 8.9  HGB 9.0* 9.4*  HCT 28.8* 29.9*  PLT PLATELET CLUMPS NOTED ON SMEAR, COUNT APPEARS INCREASED 701*    BMET   Recent Labs    10/26/17 0551 10/27/17 0536  NA 138 140  K 4.2 3.7  CL 95* 97*  CO2 32 33*  GLUCOSE 83 96  BUN 8 13  CREATININE 0.48 0.54  CALCIUM 9.2 9.1    PT/INR  No results for input(s): LABPROT, INR in the last 72 hours.  ABG  No results for input(s): PHART, HCO3 in the last 72 hours.  Invalid input(s): PCO2, PO2   Studies/Results:  No results found.   Anti-infectives:   Anti-infectives (From admission, onward)   Start     Dose/Rate Route Frequency Ordered Stop   10/25/17 2000  vancomycin (VANCOCIN) IVPB 750 mg/150 ml premix     750 mg 150 mL/hr over 60 Minutes Intravenous Every 24 hours 10/24/17 1934  10/25/17 0100  Ampicillin-Sulbactam (UNASYN) 3 g in sodium chloride 0.9 % 100 mL IVPB     3 g 200 mL/hr over 30 Minutes Intravenous Every 6 hours 10/24/17 1934     10/24/17 1945  vancomycin (VANCOCIN) 1,500 mg in sodium chloride 0.9 % 500 mL IVPB     1,500 mg 250 mL/hr over 120 Minutes Intravenous NOW 10/24/17 1934 10/24/17 2159   10/24/17 1930  piperacillin-tazobactam (ZOSYN) IVPB 3.375 g     3.375 g 100 mL/hr over 30 Minutes Intravenous  Once 10/24/17 1915 10/24/17 1857   10/24/17 1828  piperacillin-tazobactam (ZOSYN) 3.375 GM/50ML IVPB    Note to Pharmacy:  Eben Burow   : cabinet override      10/24/17 1828 10/24/17 1857   10/24/17 1808  clindamycin (CLEOCIN) 900 MG/50ML IVPB  Status:  Discontinued    Note to Pharmacy:  Marchia Meiers   : cabinet override      10/24/17 1808 10/24/17 2020   10/24/17 1730  clindamycin (CLEOCIN) IVPB 600 mg  Status:  Discontinued     600 mg 100 mL/hr over 30 Minutes Intravenous  Once 10/24/17 1726 10/24/17 Hunker. Dema Severin, M.D. [facility]

## 2017-10-28 LAB — CBC WITH DIFFERENTIAL/PLATELET
BASOS PCT: 0 %
Basophils Absolute: 0 10*3/uL (ref 0.0–0.1)
EOS PCT: 3 %
Eosinophils Absolute: 0.3 10*3/uL (ref 0.0–0.7)
HEMATOCRIT: 28.1 % — AB (ref 36.0–46.0)
HEMOGLOBIN: 8.8 g/dL — AB (ref 12.0–15.0)
LYMPHS ABS: 2.1 10*3/uL (ref 0.7–4.0)
Lymphocytes Relative: 23 %
MCH: 25.7 pg — AB (ref 26.0–34.0)
MCHC: 31.3 g/dL (ref 30.0–36.0)
MCV: 81.9 fL (ref 78.0–100.0)
Monocytes Absolute: 0.8 10*3/uL (ref 0.1–1.0)
Monocytes Relative: 9 %
NEUTROS ABS: 6 10*3/uL (ref 1.7–7.7)
Neutrophils Relative %: 65 %
Platelets: 610 10*3/uL — ABNORMAL HIGH (ref 150–400)
RBC: 3.43 MIL/uL — ABNORMAL LOW (ref 3.87–5.11)
RDW: 19.5 % — ABNORMAL HIGH (ref 11.5–15.5)
WBC: 9.2 10*3/uL (ref 4.0–10.5)

## 2017-10-28 LAB — COMPREHENSIVE METABOLIC PANEL
ALBUMIN: 2.1 g/dL — AB (ref 3.5–5.0)
ALK PHOS: 66 U/L (ref 38–126)
ALT: 8 U/L — ABNORMAL LOW (ref 14–54)
ANION GAP: 11 (ref 5–15)
AST: 12 U/L — ABNORMAL LOW (ref 15–41)
BILIRUBIN TOTAL: 0.5 mg/dL (ref 0.3–1.2)
BUN: 12 mg/dL (ref 6–20)
CALCIUM: 8.9 mg/dL (ref 8.9–10.3)
CO2: 34 mmol/L — ABNORMAL HIGH (ref 22–32)
Chloride: 97 mmol/L — ABNORMAL LOW (ref 101–111)
Creatinine, Ser: 0.49 mg/dL (ref 0.44–1.00)
GFR calc non Af Amer: >60 mL/min (ref >60)
Glucose, Bld: 142 mg/dL — ABNORMAL HIGH (ref 65–99)
Potassium: 3.6 mmol/L (ref 3.5–5.1)
Sodium: 142 mmol/L (ref 135–145)
TOTAL PROTEIN: 6 g/dL — AB (ref 6.5–8.1)

## 2017-10-28 LAB — MAGNESIUM: Magnesium: 1.5 mg/dL — ABNORMAL LOW (ref 1.7–2.4)

## 2017-10-28 LAB — GLUCOSE, CAPILLARY
GLUCOSE-CAPILLARY: 111 mg/dL — AB (ref 65–99)
Glucose-Capillary: 197 mg/dL — ABNORMAL HIGH (ref 65–99)
Glucose-Capillary: 168 mg/dL — ABNORMAL HIGH (ref 65–99)
Glucose-Capillary: 148 mg/dL — ABNORMAL HIGH (ref 65–99)

## 2017-10-28 LAB — PHOSPHORUS: Phosphorus: 3.3 mg/dL (ref 2.5–4.6)

## 2017-10-28 MED ORDER — MAGNESIUM SULFATE 2 GM/50ML IV SOLN
2.0000 g | Freq: Once | INTRAVENOUS | Status: AC
  Filled 2017-10-28: qty 50

## 2017-10-28 NOTE — Progress Notes (Signed)
PROGRESS NOTE    Laura Roberts  SWN:[id number] DOB: January 04, 1941 DOA: 10/24/2017 PCP: [name], MD   Brief Narrative:  Laura Roberts is a 77 y.o. female with medical history significant of multiple sclerosis, paraplegia, diabetes mellitus type 2 insulin-dependent, hypertension, spinal stenosis, neurogenic bladder, bilateral foot drop, history of complicated UTIs, history of sacral decubitus ulcer, as well as other comorbidities  who presented to worsening of emergency room with a chief complaint of proximal thigh leg pain.  She states that she woke up this morning described as very painful and states he was a 10 out of 10 which was constant, nonradiating and stated that it was aching pain. She stated it felt warm and swollen to the touch and it was near her vaginal area.  Says she is not able to fully see it because of her paraplegia and has very little to no feeling from the waist down.  She denies any shortness of breath,  nausea, vomiting, lightheadedness, dizziness, or chest pain.  States her muscles were aching.  [facility] was called to admit for an extensive inguinal abscess with erythema.   General surgery was consulted and took the patient to the OR for irrigation and debridement on 10/24/2017.  She is postoperative day 4 and slightly improved however still complains of pain and today it was a 3/10.  Wound cultures grew out Strep Viridans sensitivities are pending as well as possible anaerobes on the culture.  Will de-escalate antibiotics once culture and sensitivities have resulted  Assessment & Plan:   Active Problems:   Multiple sclerosis (HCC)   Weakness   HTN (hypertension)   Tachycardia   GERD (gastroesophageal reflux disease)   Neurogenic bladder   Foot drop, bilateral   Paraplegia (HCC)   Decubitus ulcer of buttock, stage 4 (HCC)   Hypokalemia   Diabetes mellitus type 2, insulin dependent (HCC)   Hypomagnesemia   Inguinal abscess   SIRS (systemic inflammatory response  syndrome) (HCC)  SIRS 2/2 left-sided Inguinal Abscess and Cellulitis s/p I&D POD4 -Admitted to Inpatient Telemetry -CT Scan of Pelvis showed "Multiloculated fluid collection with irregular enhancing margins and septations identified at the medial aspect of the upper LEFT thigh, extending from the LEFT pubic body to the level of the proximal to mid LEFT femoral diaphysis, 9.0 x 5.8 x 11.1 cm consistent with abscess.Inferior extent is not imaged; if further imaging is required distally, may consider MR or CT." -Patient presented to [facility] also emergency room she was found to be tachypneic, tachycardic and a white blood cell count of 24.2 however she was afebrile and lactic acid level was normal at 1.66 -White blood cell count went from 26.2 and is now 9.2 -Given 2 L of IV fluid boluses and placed on 100 mL/hr of IV normal saline +40 mEq of potassium chloride but was reduced to half normal saline at 50 mL's per hour with 20 mEq of KCl and now stopped. -Obtained blood cultures x2 and show no growth to date at 4 days -Continue with pain control with IV fentanyl 25 mcg every hour for severe pain and Oxycodone-Acetaminophen 1 to 2 tablets p.o. every 6 hours as needed for moderate pain -Surgery consulted for further evaluation and management and took the patient for incision and drainage 10/24/2017 -Placed on a Carb Modified Diet now that she is no longer n.p.o. -C/w Empiric antibiotics with IV Vancomycin and IV Unasyn de-escalate accordingly; Continue to Monitor Renal Fxn while on Vancomycin and monitor for toxicity. -Gram  stain on abscess culture showed abundant WBCs present predominantly PMNs.  There is also abundant gram-positive cocci in pairs as well as gram-negative rods and few gram-positive rods.  The aerobic/anaerobic culture grew Strep Viridans and are holding for Anaerobes.  -General Surgery is to start twice daily dressing changes and feels as if she needs deep packing -PT/OT evaluated and signed  off as patient is bedridden and max assist at SNF -Once sensitivities are resulted patient will be transitioned to p.o. and will be discharged back to SNF  Hypokalemia, improved -Patient'sPotassium on admission was 2.8 and now has improved to 3.6 -Discontinued half-normal saline with 20 mEq of KCl IV fluid running down this evening -Continue to monitor and replete as necessary -Repeat CMP in a.m.  Hypomagnesemia -Patient's magnesium level 1.5 this AM -Repleted with IV mag sulfate 2 g -Continue to monitor and replete as necessary -Repeat magnesium level in a.m.  Hyponatremia/Hypochloremia -Likely from dehydration -Discontinued IV fluid hydration as above -Sodium is now 142 and Chloride is now 97 -Repeat CMP in a.m.  Hypophosphatemia -Patient's phosphorus level was 3.3 this morning -Continue to monitor and replete as necessary -Repeat phosphorus level in a.m.  Thrombocytosis -Likely reactive however patient's platelet count has been elevated for last 2 weeks. -Initially was trending down but had a slight bump and now trending down again.  Patient's platelet count went from 634 -> 481 -> 701 -> 610 -Will Discuss with Hematology if continues to worsen -Continue to monitor and repeat CBC in a.m.  Normocytic Anemia -Patient's Hemoglobin/Hematocrit went from 10.5/32.1 -> 9.6/29.6 -> 9.0/28.8 -> 9.4/29.9 -> 8.8/28.1 -Check Anemia Panel in the morning -Continue to monitor for signs and symptoms of bleeding -Repeat CBC in a.m.  Multiple Sclerosis with Paraplegia, bilateral foot drop, and Neurogenic bladder -Recent Admission to [facility] For complicated UTI.  Her catheter was changed out at that time -Continue with Baclofen, gabapentin, prednisone, Inderal LA -We will resume the patient's home [employer] for her MS -Continue with supportive care care and management  Essential Hypertension  -Continue Amlodipine 10 mg p.o. Nightly   Diabetes MellitusType 2  insulin-dependent -Patient takes 25 units of Lantus at home however will place her on 20 units subcu nightly -Added moderate NovoLog sliding scale before meals and at bedtime -Checked Hemoglobin A1c and was 6.9  -Continue to monitor CBGs closely; BPs have been ranging from 75-111  Sinus Tachycardia, improved -Likely pain mediated as well as dehydration -Heart rates in the 80s now -Discontinued with IV fluid hydration with half-normal saline at 50 mL's per hour +20 mg of KCl now and discontinue in a.m.  Sacral Decubitus Ulcer on Right  And Right heel ulcer -poA -WOC Nurse Consult and recommending barrier cream to protect and promote healing to the right ischium and bilateral buttocks and she is recommended Santyl for in the medical debridement of nonviable tissue of the right heel -CT Scan showed Inferior Right Gluteal Decubitus Ulcer  DVT prophylaxis: Heparin 5000 units subcu every Code Status: DO NOT RESUSCITATE Family Communication: Family present at bedside Disposition Plan: Anticipate discharge back to SNF in the next 24-48 hours if improving   Consultants:   General Surgery Dr. Lucia Gaskins    Procedures: Left Thigh Abscess Irrigation and Debridement 10/24/17   Antimicrobials:  Anti-infectives (From admission, onward)   Start     Dose/Rate Route Frequency Ordered Stop   10/25/17 2000  vancomycin (VANCOCIN) IVPB 750 mg/150 ml premix     750 mg 150 mL/hr over 60 Minutes Intravenous  Every 24 hours 10/24/17 1934     10/25/17 0100  Ampicillin-Sulbactam (UNASYN) 3 g in sodium chloride 0.9 % 100 mL IVPB     3 g 200 mL/hr over 30 Minutes Intravenous Every 6 hours 10/24/17 1934     10/24/17 1945  vancomycin (VANCOCIN) 1,500 mg in sodium chloride 0.9 % 500 mL IVPB     1,500 mg 250 mL/hr over 120 Minutes Intravenous NOW 10/24/17 1934 10/24/17 2159   10/24/17 1930  piperacillin-tazobactam (ZOSYN) IVPB 3.375 g     3.375 g 100 mL/hr over 30 Minutes Intravenous  Once 10/24/17 1915  10/24/17 1857   10/24/17 1828  piperacillin-tazobactam (ZOSYN) 3.375 GM/50ML IVPB    Note to Pharmacy:  Eben Burow   : cabinet override      10/24/17 1828 10/24/17 1857   10/24/17 1808  clindamycin (CLEOCIN) 900 MG/50ML IVPB  Status:  Discontinued    Note to Pharmacy:  Marchia Meiers   : cabinet override      10/24/17 1808 10/24/17 2020   10/24/17 1730  clindamycin (CLEOCIN) IVPB 600 mg  Status:  Discontinued     600 mg 100 mL/hr over 30 Minutes Intravenous  Once 10/24/17 1726 10/24/17 1748     Subjective: Seen and examined at bedside and states that she was doing better.  Stated pain level to 3 out of 10.  No nausea, vomiting, chest pain, shortness of breath. She did not want any more blood taken.  Discussed with her about pending sensitivities and she understood plan of care.  Objective: Vitals:   10/27/17 1244 10/27/17 2054 10/28/17 0500 10/28/17 0644  BP: 120/75 127/64  125/66  Pulse: (!) 101 99  87  Resp: 18 17  14   Temp: 97.6 F (36.4 C) 98 F (36.7 C)  97.7 F (36.5 C)  TempSrc: Oral Oral  Oral  SpO2: 98% 100%  100%  Weight:   83.3 kg (183 lb 10.3 oz)   Height:        Intake/Output Summary (Last 24 hours) at 10/28/2017 0756 Last data filed at 10/28/2017 0865 Gross per 24 hour  Intake 1380 ml  Output 1400 ml  Net -20 ml   Filed Weights   10/26/17 0526 10/27/17 0416 10/28/17 0500  Weight: 81.4 kg (179 lb 7.3 oz) 83.2 kg (183 lb 6.8 oz) 83.3 kg (183 lb 10.3 oz)   Examination: Physical Exam:  Constitutional: Well-nourished, well-developed obese hepatomegaly female is currently in no acute distress. Eyes: Sclera are anicteric. Conjunctivae and lids normal ENMT: External ears and nose appear normal.  Mucous membranes are moist Neck: Supple with no JVD Respiratory: Diminished to auscultation bilaterally. No appreciable wheezing, rales, rhonchi.  Unlabored with supplemental oxygen via.   Cardiovascular: Regular rate and rhythm.  No appreciable murmur. Abdomen: Soft,  nontender, distended secondary to body habitus.  Bowel sounds present upper quadrant GU: Deferred. Suprapubic catheter is in place Musculoskeletal: Patient is paraplegic with a neurogenic bladder and does have bilateral foot drop. Skin: Warm and dry.  Proximal medial thigh wound is open and draining from.  Sacral decubitus as well a pressure ulcer in the right heel. Neurologic: Cranial nerves II through XII grossly intact with no visible focal deficits. Psychiatric: Intact judgment and insight.  Appears better today but still appears slightly depressed.  Awake currently  Data Reviewed: I have personally reviewed following labs and imaging studies  CBC: Recent Labs  Lab 10/24/17 1509 10/25/17 0456 10/26/17 0551 10/27/17 0536 10/28/17 0554  WBC 24.2* 26.2* 12.2*  8.9 9.2  NEUTROABS 21.5*  --  10.4* 6.3 6.0  HGB 10.5* 9.6* 9.0* 9.4* 8.8*  HCT 32.1* 29.6* 28.8* 29.9* 28.1*  MCV 78.9 79.4 80.7 81.3 81.9  PLT 634* 481* PLATELET CLUMPS NOTED ON SMEAR, COUNT APPEARS INCREASED 701* 161*   Basic Metabolic Panel: Recent Labs  Lab 10/24/17 1509 10/25/17 0456 10/25/17 0953 10/26/17 0551 10/27/17 0536 10/28/17 0554  NA 134* 134*  --  138 140 142  K 2.7* 2.8*  --  4.2 3.7 3.6  CL 88* 91*  --  95* 97* 97*  CO2 29 27  --  32 33* 34*  GLUCOSE 173* 212*  --  83 96 142*  BUN 9 8  --  8 13 12   CREATININE 0.55 0.69  --  0.48 0.54 0.49  CALCIUM 9.4 8.9  --  9.2 9.1 8.9  MG 1.4*  --  1.4* 1.9 1.7 1.5*  PHOS  --   --  3.1 2.4* 3.2 3.3   GFR: Estimated Creatinine Clearance: 61.7 mL/min (by C-G formula based on SCr of 0.49 mg/dL). Liver Function Tests: Recent Labs  Lab 10/25/17 0456 10/26/17 0551 10/27/17 0536 10/28/17 0554  AST 9* 15 13* 12*  ALT QUANTITY NOT SUFFICIENT, UNABLE TO PERFORM TEST 10* 9* 8*  ALKPHOS 76 71 70 66  BILITOT 0.9 0.6 0.4 0.5  PROT 5.7* 6.5 6.1* 6.0*  ALBUMIN 2.0* 2.2* 2.2* 2.1*   No results for input(s): LIPASE, AMYLASE in the last 168 hours. No results for  input(s): AMMONIA in the last 168 hours. Coagulation Profile: No results for input(s): INR, PROTIME in the last 168 hours. Cardiac Enzymes: No results for input(s): CKTOTAL, CKMB, CKMBINDEX, TROPONINI in the last 168 hours. BNP (last 3 results) No results for input(s): PROBNP in the last 8760 hours. HbA1C: Recent Labs    10/25/17 1714  HGBA1C 6.9*   CBG: Recent Labs  Lab 10/27/17 0752 10/27/17 1240 10/27/17 1640 10/27/17 2049 10/28/17 0728  GLUCAP 79 75 139* 152* 111*   Lipid Profile: No results for input(s): CHOL, HDL, LDLCALC, TRIG, CHOLHDL, LDLDIRECT in the last 72 hours. Thyroid Function Tests: No results for input(s): TSH, T4TOTAL, FREET4, T3FREE, THYROIDAB in the last 72 hours. Anemia Panel: No results for input(s): VITAMINB12, FOLATE, FERRITIN, TIBC, IRON, RETICCTPCT in the last 72 hours. Sepsis Labs: Recent Labs  Lab 10/24/17 1515  LATICACIDVEN 1.66   Recent Results (from the past 240 hour(s))  Aerobic/Anaerobic Culture (surgical/deep wound)     Status: None (Preliminary result)   Collection Time: 10/24/17  7:13 PM  Result Value Ref Range Status   Specimen Description   Final    ABSCESS LEFT THIGH Performed at [facility] [address] [place], [place] [address]    Special Requests   Final    NONE Performed at [facility], [place] [address] [place], [place] [address]    Gram Stain   Final    ABUNDANT WBC PRESENT, PREDOMINANTLY PMN ABUNDANT GRAM POSITIVE COCCI IN PAIRS ABUNDANT GRAM NEGATIVE RODS FEW GRAM POSITIVE RODS    Culture   Final    ABUNDANT VIRIDANS STREPTOCOCCUS HOLDING FOR POSSIBLE ANAEROBE Performed at [facility], [place] [address], [place], [place] [address]    Report Status PENDING  Incomplete  Culture, blood (routine x 2)     Status: None (Preliminary result)   Collection Time: 10/24/17  9:04 PM  Result Value Ref Range Status   Specimen Description   Final  BLOOD RIGHT ARM Performed  at [facility], [place] [address] [place], [place] [address]    Special Requests   Final    BOTTLES DRAWN AEROBIC AND ANAEROBIC Blood Culture adequate volume Performed at [facility] [address], [place], [place] [address]    Culture   Final    NO GROWTH 3 DAYS Performed at [facility], [place] [address] [place], [place] [address]    Report Status PENDING  Incomplete  Culture, blood (routine x 2)     Status: None (Preliminary result)   Collection Time: 10/24/17  9:04 PM  Result Value Ref Range Status   Specimen Description   Final    BLOOD RIGHT HAND Performed at [facility] [address], [place], [place] [address]    Special Requests   Final    BOTTLES DRAWN AEROBIC ONLY Blood Culture adequate volume Performed at [facility] [address], [place], [place] [address]    Culture   Final    NO GROWTH 3 DAYS Performed at [facility], [place] [address], [place], [place] [address]    Report Status PENDING  Incomplete  Urine culture     Status: None   Collection Time: 10/25/17  5:14 AM  Result Value Ref Range Status   Specimen Description   Final    URINE, RANDOM Performed at [facility] [address] [place], [place] [address]    Special Requests   Final    NONE Performed at [facility], [place] [address] [place], [place] [address]    Culture   Final    NO GROWTH Performed at [facility], [place] [address], [place], [place] [address]    Report Status 10/26/2017 FINAL  Final    Radiology Studies: No results found. Scheduled Meds: . acetaminophen  650 mg Oral TID  . amLODipine  10 mg Oral QHS  . baclofen  15 mg Oral QAC breakfast   And  . baclofen  10 mg Oral Q24H  . baclofen  20 mg Oral QHS  . calcium citrate  200 mg of elemental calcium Oral Daily  . cholecalciferol  1,000 Units Oral Daily  . collagenase   Topical Daily  .  Dimethyl Fumarate  240 mg Oral BID  . DULoxetine  90 mg Oral Daily  . feeding supplement (PRO-STAT SUGAR FREE 64)  30 mL Oral BID  . fesoterodine  4 mg Oral Daily  . gabapentin  300 mg Oral BID  . heparin injection (subcutaneous)  5,000 Units Subcutaneous Q8H  . insulin aspart  0-15 Units Subcutaneous TID WC  . insulin aspart  0-5 Units Subcutaneous QHS  . insulin glargine  20 Units Subcutaneous QHS  . nutrition supplement (JUVEN)  1 packet Oral BID BM  . pantoprazole  40 mg Oral Daily  . predniSONE  2.5 mg Oral Q48H  . predniSONE  5 mg Oral Q48H  . senna-docusate  2 tablet Oral QHS   Continuous Infusions: . ampicillin-sulbactam (UNASYN) IV Stopped (10/28/17 0741)  . magnesium sulfate    . magnesium sulfate 1 - 4 g bolus IVPB    . sodium chloride    . vancomycin Stopped (10/27/17 2135)    LOS: 4 days   Kerney Elbe, DO [facility] Pager [phone]  If 7PM-7AM, please contact night-coverage [email or web address] Password [facility] 10/28/2017, 7:56 AM

## 2017-10-28 NOTE — Progress Notes (Signed)
Pharmacy Antibiotic Note  Laura Roberts is a 77 y.o. paraplegic female presented to the ED from nursing facility  on 10/24/2017 with left inguinal abscess.  Patient went for I&D on 4/26. To start vancomycin and unasyn for infection post-op  - scr 0.49, paraplegic   Plan: - Continue unasyn 3gm IV q6h   - Continue vancomycin 750 mg IV 24h  - monitor renal function - f/u cultures  ____________________________________  Height: 5\' 2"  (157.5 cm) Weight: 183 lb 10.3 oz (83.3 kg) IBW/kg (Calculated) : 50.1  Temp (24hrs), Avg:97.8 F (36.6 C), Min:97.6 F (36.4 C), Max:98 F (36.7 C)  Recent Labs  Lab 10/24/17 1509 10/24/17 1515 10/25/17 0456 10/26/17 0551 10/27/17 0536 10/28/17 0554  WBC 24.2*  --  26.2* 12.2* 8.9 9.2  CREATININE 0.55  --  0.69 0.48 0.54 0.49  LATICACIDVEN  --  1.66  --   --   --   --     Estimated Creatinine Clearance: 61.7 mL/min (by C-G formula based on SCr of 0.49 mg/dL).    Allergies  Allergen Reactions  . Codeine Other (See Comments)    Difficult breathing and skin problem  . Ultram [Tramadol] Other (See Comments)    Difficult breathing and skin peeling  . Januvia [Sitagliptin] Rash    Blisters     Antimicrobials this admission:  4/26 zosyn x 1 4/26 vanc>> 4/26 unasyn>>  Dose adjustments this admission:  -----  Microbiology results:  4/27 BCx: ngtd 4/26 L thigh abscess: abundant GPC pairs, GNR, few GPR, Strep viridians, sens pending  4/27 UCx: NGF    Thank you for allowing pharmacy to be a part of this patient's care.   Royetta Asal, PharmD, BCPS Pager [phone] 10/28/2017 12:01 PM

## 2017-10-29 DIAGNOSIS — Z9359 Other cystostomy status: Secondary | ICD-10-CM

## 2017-10-29 DIAGNOSIS — A419 Sepsis, unspecified organism: Principal | ICD-10-CM

## 2017-10-29 LAB — CULTURE, BLOOD (ROUTINE X 2)
CULTURE: NO GROWTH
CULTURE: NO GROWTH
Special Requests: ADEQUATE
Special Requests: ADEQUATE

## 2017-10-29 LAB — CBC WITH DIFFERENTIAL/PLATELET
Basophils Absolute: 0.1 10*3/uL (ref 0.0–0.1)
Basophils Relative: 1 %
Eosinophils Absolute: 0.4 10*3/uL (ref 0.0–0.7)
Eosinophils Relative: 3 %
HCT: 29.1 % — ABNORMAL LOW (ref 36.0–46.0)
HEMOGLOBIN: 9 g/dL — AB (ref 12.0–15.0)
LYMPHS PCT: 24 %
Lymphs Abs: 2.9 10*3/uL (ref 0.7–4.0)
MCH: 25.1 pg — ABNORMAL LOW (ref 26.0–34.0)
MCHC: 30.9 g/dL (ref 30.0–36.0)
MCV: 81.1 fL (ref 78.0–100.0)
MONOS PCT: 9 %
Monocytes Absolute: 1.1 10*3/uL — ABNORMAL HIGH (ref 0.1–1.0)
Neutro Abs: 7.5 10*3/uL (ref 1.7–7.7)
Neutrophils Relative %: 63 %
Platelets: 677 10*3/uL — ABNORMAL HIGH (ref 150–400)
RBC: 3.59 MIL/uL — AB (ref 3.87–5.11)
RDW: 19.7 % — ABNORMAL HIGH (ref 11.5–15.5)
WBC: 12 10*3/uL — AB (ref 4.0–10.5)

## 2017-10-29 LAB — COMPREHENSIVE METABOLIC PANEL
ALBUMIN: 2.3 g/dL — AB (ref 3.5–5.0)
ALK PHOS: 67 U/L (ref 38–126)
ALT: 9 U/L — AB (ref 14–54)
AST: 14 U/L — AB (ref 15–41)
Anion gap: 14 (ref 5–15)
BUN: 12 mg/dL (ref 6–20)
CALCIUM: 9 mg/dL (ref 8.9–10.3)
CO2: 33 mmol/L — AB (ref 22–32)
Chloride: 94 mmol/L — ABNORMAL LOW (ref 101–111)
Creatinine, Ser: 0.47 mg/dL (ref 0.44–1.00)
GFR calc Af Amer: >60 mL/min (ref >60)
GFR calc non Af Amer: >60 mL/min (ref >60)
GLUCOSE: 92 mg/dL (ref 65–99)
Potassium: 3.2 mmol/L — ABNORMAL LOW (ref 3.5–5.1)
SODIUM: 141 mmol/L (ref 135–145)
Total Bilirubin: 0.4 mg/dL (ref 0.3–1.2)
Total Protein: 6.3 g/dL — ABNORMAL LOW (ref 6.5–8.1)

## 2017-10-29 LAB — AEROBIC/ANAEROBIC CULTURE W GRAM STAIN (SURGICAL/DEEP WOUND)

## 2017-10-29 LAB — MAGNESIUM: Magnesium: 1.5 mg/dL — ABNORMAL LOW (ref 1.7–2.4)

## 2017-10-29 LAB — GLUCOSE, CAPILLARY
GLUCOSE-CAPILLARY: 89 mg/dL (ref 65–99)
Glucose-Capillary: 125 mg/dL — ABNORMAL HIGH (ref 65–99)

## 2017-10-29 LAB — AEROBIC/ANAEROBIC CULTURE (SURGICAL/DEEP WOUND)

## 2017-10-29 LAB — PHOSPHORUS: Phosphorus: 2.7 mg/dL (ref 2.5–4.6)

## 2017-10-29 MED ORDER — JUVEN PO PACK: 1.0000 | PACK | Freq: Every day | ORAL | Status: DC

## 2017-10-29 MED ORDER — AMOXICILLIN-POT CLAVULANATE 875-125 MG PO TABS: 1.0000 | ORAL_TABLET | Freq: Two times a day (BID) | ORAL | Status: DC

## 2017-10-29 MED ORDER — INSULIN ASPART 100 UNIT/ML ~~LOC~~ SOLN: 0.0000 [IU] | Freq: Three times a day (TID) | SUBCUTANEOUS | 11 refills | Status: DC

## 2017-10-29 MED ORDER — POTASSIUM CHLORIDE CRYS ER 20 MEQ PO TBCR
40.0000 meq | EXTENDED_RELEASE_TABLET | ORAL | Status: AC
  Administered 2017-10-29 (×2): 40 meq via ORAL
  Filled 2017-10-29 (×2): qty 2

## 2017-10-29 MED ORDER — JUVEN PO PACK: 1.0000 | PACK | Freq: Every day | ORAL | 0 refills | Status: DC

## 2017-10-29 MED ORDER — COLLAGENASE 250 UNIT/GM EX OINT: TOPICAL_OINTMENT | Freq: Every day | CUTANEOUS | 0 refills | Status: DC

## 2017-10-29 MED ORDER — PREMIER PROTEIN SHAKE
11.0000 [oz_av] | ORAL | Status: DC
  Filled 2017-10-29: qty 325.31

## 2017-10-29 MED ORDER — ACETAMINOPHEN 325 MG PO TABS: 650.0000 mg | ORAL_TABLET | Freq: Four times a day (QID) | ORAL | Status: DC | PRN

## 2017-10-29 MED ORDER — MAGNESIUM SULFATE 4 GM/100ML IV SOLN
4.0000 g | Freq: Once | INTRAVENOUS | Status: AC
  Administered 2017-10-29: 4 g via INTRAVENOUS
  Filled 2017-10-29: qty 100

## 2017-10-29 MED ORDER — PREMIER PROTEIN SHAKE: 11.0000 [oz_av] | ORAL | 0 refills | Status: DC

## 2017-10-29 MED ORDER — AMOXICILLIN-POT CLAVULANATE 875-125 MG PO TABS
1.0000 | ORAL_TABLET | Freq: Two times a day (BID) | ORAL | Status: DC
  Administered 2017-10-29: 1 via ORAL
  Filled 2017-10-29: qty 1

## 2017-10-29 NOTE — NC FL2 (Signed)
[place] LEVEL OF CARE SCREENING TOOL     IDENTIFICATION  Patient Name: Laura Roberts Birthdate: 02-23-1941 Sex: female Admission Date (Current Location): 10/24/2017  [facility] and [place] Number:  Herbalist and Address:  [facility],  [address] [place], [place]      Provider Number: [id number]  Attending Physician Name and Address:  Debbe Odea, MD  Relative Name and Phone Number:  Virgie Kunda III - son, [phone]    Current Level of Care: Hospital Recommended Level of Care: [place] Prior Approval Number:    Date Approved/Denied:   PASRR Number: [id number]  Discharge Plan: SNF    Current Diagnoses: Patient Active Problem List   Diagnosis Date Noted  . Inguinal abscess 10/24/2017  . SIRS (systemic inflammatory response syndrome) ([place]) 10/24/2017  . Hypokalemia   . Diabetes mellitus type 2, insulin dependent ([place])   . Hypomagnesemia   . UTI (urinary tract infection) 10/09/2017  . Complicated UTI (urinary tract infection) 10/09/2017  . Febrile illness   . UTI (urinary tract infection) due to urinary indwelling Foley catheter ([place]) 08/13/2017  . Decubitus ulcer of buttock, stage 4 ([place]) 11/22/2016  . Sepsis ([place]) 11/22/2016  . Leukocytosis   . Paraplegia ([place]) 06/01/2014  . Closed supracondylar fracture of right femur, periprosthetic 06/01/2014  . Femoral fracture ([place]) 06/01/2014  . Encounter for therapeutic drug monitoring 01/05/2014  . Foot drop, bilateral 10/13/2012  . Abnormality of gait 10/13/2012  . Neurogenic bladder 09/02/2012  . Neurogenic pain of lower extremity 09/02/2012  . Weakness 07/21/2012  . HTN (hypertension) 07/21/2012  . DM (diabetes mellitus) ([place]) 07/21/2012  . HLD (hyperlipidemia) 07/21/2012  . Tachycardia 07/21/2012  . GERD (gastroesophageal reflux disease) 07/21/2012  . Multiple sclerosis (HCC)     Orientation RESPIRATION BLADDER Height & Weight     Self, Time,  Situation, Place  Normal Indwelling catheter Weight: 191 lb 9.6 oz (86.9 kg) Height:  5\' 2"  (157.5 cm)  BEHAVIORAL SYMPTOMS/MOOD NEUROLOGICAL BOWEL NUTRITION STATUS      Incontinent Diet(carb modified)  AMBULATORY STATUS COMMUNICATION OF NEEDS Skin   Total Care Verbally Other (Comment), PU Stage and Appropriate Care(PressureInjuryUnstageable- Location: Heel Location Orientation: Posterior;Right Dressing:Foam;Santyl   PressureInjuryUnstageableLocation: Buttocks Location Orientation: Posterior;Proximal;Right  Dressing: Foam)   PU Stage 2 Dressing: ( PressureInjury04/11/19StageII-Partialthicknesslossofdermispresentingasashallowopenulcerwithared,pinkwoundbedwithoutslough.healedstage2pressureinjury   Location: Buttocks Location Orientation: Left;Posterior;  Dressing: Foam) PU Stage 3 Dressing: ( PressureInjury04/11/19StageIII-Fullthicknesstissueloss.Subcutaneousfatmaybevisiblebutbone,tendonormuscleareNOTexposed.    Location: Sacrum  Dressing: Foam)                 Personal Care Assistance Level of Assistance  Bathing, Feeding, Dressing Bathing Assistance: Limited assistance Feeding assistance: Limited assistance Dressing Assistance: Limited assistance     Functional Limitations Info  Sight, Hearing, Speech Sight Info: Adequate Hearing Info: Adequate Speech Info: Adequate    SPECIAL CARE FACTORS FREQUENCY                       Contractures Contractures Info: Not present    Additional Factors Info  Code Status, Allergies Code Status Info: DNR Allergies Info: Codeine, Ultram Tramadol, Januvia Sitagliptin           Current Medications (10/29/2017):  This is the current hospital active medication list Current Facility-Administered Medications  Medication Dose Route Frequency Provider Last Rate Last Dose  . acetaminophen (TYLENOL) tablet 650 mg  650 mg Oral Q6H PRN Raiford Noble [place], DO   650 mg at 10/26/17 0129    Or  .  acetaminophen (TYLENOL) suppository 650 mg  650 mg Rectal Q6H PRN Raiford Noble Latif, DO      . acetaminophen (TYLENOL) tablet 650 mg  650 mg Oral TID Raiford Noble [place], DO   650 mg at 10/29/17 4332  . amLODipine (NORVASC) tablet 10 mg  10 mg Oral QHS Raiford Noble [place], DO   10 mg at 10/28/17 2119  . amoxicillin-clavulanate (AUGMENTIN) 875-125 MG per tablet 1 tablet  1 tablet Oral Q12H Rizwan, Saima, MD      . baclofen (LIORESAL) tablet 15 mg  15 mg Oral QAC breakfast Raiford Noble [place], DO   15 mg at 10/29/17 0848   And  . baclofen (LIORESAL) tablet 10 mg  10 mg Oral Q24H SheikhGeorgina Quint [place], DO   10 mg at 10/28/17 1418  . baclofen (LIORESAL) tablet 20 mg  20 mg Oral QHS Raiford Noble [place], DO   20 mg at 10/28/17 2119  . bisacodyl (DULCOLAX) suppository 10 mg  10 mg Rectal Daily PRN Raiford Noble Latif, DO      . calcium citrate (CALCITRATE - dosed in mg elemental calcium) tablet 200 mg of elemental calcium  200 mg of elemental calcium Oral Daily Raiford Noble Latif, DO   200 mg of elemental calcium at 10/29/17 0959  . cholecalciferol (VITAMIN D) tablet 1,000 Units  1,000 Units Oral Daily Raiford Noble [place], [place]   1,000 Units at 10/29/17 9518  . collagenase (SANTYL) ointment   Topical Daily Raiford Noble [place], [place]      . Dimethyl Fumarate CPDR 240 mg  240 mg Oral BID Raiford Noble [place], DO   240 mg at 10/29/17 8416  . DULoxetine (CYMBALTA) DR capsule 90 mg  90 mg Oral Daily Raiford Noble [place], DO   90 mg at 10/29/17 6063  . feeding supplement (PRO-STAT SUGAR FREE 64) liquid 30 mL  30 mL Oral BID Raiford Noble [place], DO   30 mL at 10/29/17 0959  . fentaNYL (SUBLIMAZE) injection 25 mcg  25 mcg Intravenous Q1H PRN Alphonsa Overall, MD   25 mcg at 10/28/17 2118  . fesoterodine (TOVIAZ) tablet 4 mg  4 mg Oral Daily Raiford Noble [place], DO   4 mg at 10/29/17 0160  . gabapentin (NEURONTIN) capsule 300 mg  300 mg Oral BID Raiford Noble [place], DO   300 mg at 10/29/17 1093  . heparin injection  5,000 Units  5,000 Units Subcutaneous Q8H Alphonsa Overall, MD   5,000 Units at 10/29/17 [phone]  . insulin aspart (novoLOG) injection 0-15 Units  0-15 Units Subcutaneous TID WC Raiford Noble [place], DO   3 Units at 10/28/17 1733  . insulin aspart (novoLOG) injection 0-5 Units  0-5 Units Subcutaneous QHS Sheikh, Omair Latif, DO      . insulin glargine (LANTUS) injection 20 Units  20 Units Subcutaneous QHS Raiford Noble [place], [place]   20 Units at 10/28/17 2121  . magnesium sulfate IVPB 4 g 100 mL  4 g Intravenous Once Debbe Odea, MD 50 mL/hr at 10/29/17 0849 4 g at 10/29/17 0849  . nutrition supplement (JUVEN) (JUVEN) powder packet 1 packet  1 packet Oral BID BM Raiford Noble [place], DO   1 packet at 10/27/17 [phone]  . ondansetron (ZOFRAN) tablet 4 mg  4 mg Oral Q6H PRN Raiford Noble Latif, DO       Or  . ondansetron [facility]) injection 4 mg  4 mg Intravenous Q6H PRN Sheikh, Omair Latif, DO      . oxyCODONE-acetaminophen (PERCOCET/ROXICET) [phone]  MG per tablet 1-2 tablet  1-2 tablet Oral Q6H PRN Vertis Kelch, NP   1 tablet at 10/26/17 [phone]  . pantoprazole (PROTONIX) EC tablet 40 mg  40 mg Oral Daily Raiford Noble [place], DO   40 mg at 10/29/17 0931  . polyethylene glycol (MIRALAX / GLYCOLAX) packet 17 g  17 g Oral Daily PRN Sheikh, Omair Latif, DO      . potassium chloride SA (K-DUR,KLOR-CON) CR tablet 40 mEq  40 mEq Oral Q4H Debbe Odea, MD   40 mEq at 10/29/17 0849  . predniSONE (DELTASONE) tablet 2.5 mg  2.5 mg Oral Q48H Sheikh, Georgina Quint [place], DO   2.5 mg at 10/28/17 0830  . predniSONE (DELTASONE) tablet 5 mg  5 mg Oral Q48H Sheikh, Georgina Quint [place], DO   5 mg at 10/29/17 1216  . senna-docusate (Senokot-S) tablet 2 tablet  2 tablet Oral QHS Raiford Noble [place], [place]   2 tablet at 10/28/17 2119  . sodium chloride 0.9 % bolus 1,000 mL  1,000 mL Intravenous Once Kerney Elbe, DO         Discharge Medications: Please see discharge summary for a list of discharge medications.  Relevant Imaging  Results:  Relevant Lab Results:   Additional Information ss#[id number]  Burnis Medin, LCSW

## 2017-10-29 NOTE — Progress Notes (Signed)
[facility] Progress Note  5 Days Post-Op  Subjective: CC- L thigh abscess Patient states that he feels ok at rest, but continues to have a lot of pain with dressing changes. WBC up to 12.2 today from 9.2, afebrile. No new complaints including dysuria or cough.  Objective: Vital signs in last 24 hours: Temp:  [97.7 F (36.5 C)-98.4 F (36.9 C)] 98.1 F (36.7 C) (05/01 0557) Pulse Rate:  [90-104] 100 (05/01 0557) Resp:  [16-18] 18 (05/01 0557) BP: (124-146)/(69-85) 143/73 (05/01 0557) SpO2:  [92 %-99 %] 99 % (05/01 0557) Weight:  [191 lb 9.6 oz (86.9 kg)] 191 lb 9.6 oz (86.9 kg) (05/01 0557) Last BM Date: 10/28/17  Intake/Output from previous day: 04/30 0701 - 05/01 0700 In: 1270 [P.O.:720; IV Piggyback:550] Out: 1700 [Urine:1700] Intake/Output this shift: No intake/output data recorded.  PE: Gen:  Alert, NAD, pleasant HEENT: EOM's intact, pupils equal and round Pulm:  effort normal Abd: Soft, NT/ND LLE: wound clean/pink with no purulent drainage, fluctuance, or surrounding erythema       Lab Results:  Recent Labs    10/28/17 0554 10/29/17 0530  WBC 9.2 12.0*  HGB 8.8* 9.0*  HCT 28.1* 29.1*  PLT 610* 677*   BMET Recent Labs    10/28/17 0554 10/29/17 0530  NA 142 141  K 3.6 3.2*  CL 97* 94*  CO2 34* 33*  GLUCOSE 142* 92  BUN 12 12  CREATININE 0.49 0.47  CALCIUM 8.9 9.0   PT/INR No results for input(s): LABPROT, INR in the last 72 hours. CMP     Component Value Date/Time   NA 141 10/29/2017 0530   NA 145 (H) 03/06/2017 1427   K 3.2 (L) 10/29/2017 0530   CL 94 (L) 10/29/2017 0530   CO2 33 (H) 10/29/2017 0530   GLUCOSE 92 10/29/2017 0530   BUN 12 10/29/2017 0530   BUN 21 03/06/2017 1427   CREATININE 0.47 10/29/2017 0530   CALCIUM 9.0 10/29/2017 0530   PROT 6.3 (L) 10/29/2017 0530   PROT 7.2 03/06/2017 1427   ALBUMIN 2.3 (L) 10/29/2017 0530   ALBUMIN 4.3 03/06/2017 1427   AST 14 (L) 10/29/2017 0530   ALT 9 (L) 10/29/2017  0530   ALKPHOS 67 10/29/2017 0530   BILITOT 0.4 10/29/2017 0530   BILITOT 0.3 03/06/2017 1427   GFRNONAA >60 10/29/2017 0530   GFRAA >60 10/29/2017 0530   Lipase  No results found for: LIPASE     Studies/Results: No results found.  Anti-infectives: Anti-infectives (From admission, onward)   Start     Dose/Rate Route Frequency Ordered Stop   10/25/17 2000  vancomycin (VANCOCIN) IVPB 750 mg/150 ml premix     750 mg 150 mL/hr over 60 Minutes Intravenous Every 24 hours 10/24/17 1934     10/25/17 0100  Ampicillin-Sulbactam (UNASYN) 3 g in sodium chloride 0.9 % 100 mL IVPB     3 g 200 mL/hr over 30 Minutes Intravenous Every 6 hours 10/24/17 1934     10/24/17 1945  vancomycin (VANCOCIN) 1,500 mg in sodium chloride 0.9 % 500 mL IVPB     1,500 mg 250 mL/hr over 120 Minutes Intravenous NOW 10/24/17 1934 10/24/17 2159   10/24/17 1930  piperacillin-tazobactam (ZOSYN) IVPB 3.375 g     3.375 g 100 mL/hr over 30 Minutes Intravenous  Once 10/24/17 1915 10/24/17 1857   10/24/17 1828  piperacillin-tazobactam (ZOSYN) 3.375 GM/50ML IVPB    Note to Pharmacy:  Eben Burow   : cabinet override  10/24/17 1828 10/24/17 1857   10/24/17 1808  clindamycin (CLEOCIN) 900 MG/50ML IVPB  Status:  Discontinued    Note to Pharmacy:  Marchia Meiers   : cabinet override      10/24/17 1808 10/24/17 2020   10/24/17 1730  clindamycin (CLEOCIN) IVPB 600 mg  Status:  Discontinued     600 mg 100 mL/hr over 30 Minutes Intravenous  Once 10/24/17 1726 10/24/17 1748       Assessment/Plan HTN DM MS, bedbound Chronic prednisone use Neurogenic bladder s/p suprapubic catheter Anemia - Hg 9 from 9.6, stable  Left thigh abscess s/p I&D 4/26 Dr. Lucia Gaskins - POD 5 - culture growing ABUNDANT VIRIDANS STREPTOCOCCUS, report pending - BID wet to dry dressing changes  ID - vancomycin 4/26>>, unasyn 4/27>> FEN - Carb mod diet VTE - SCDs, heparin Foley - SP catheter  Plan - WBC up today but patient is afebrile;  wounds clean and do not appear to be the source. Continue BID wet to dry dressing changes. Ok to bathe with wounds open. Follow culture, will narrow abx once susceptibilities back. Repeat labs in AM. Magnesium/potassium being replaced by primary.    LOS: 5 days    Wellington Hampshire , [facility] 10/29/2017, 8:25 AM Pager: [phone] Consults: [phone] Mon-Fri 7:00 am-4:30 pm Sat-Sun 7:00 am-11:30 am

## 2017-10-29 NOTE — Plan of Care (Signed)
  Problem: Education: Goal: Knowledge of General Education information will improve Outcome: Progressing   Problem: Health Behavior/Discharge Planning: Goal: Ability to manage health-related needs will improve Outcome: Progressing   Problem: Clinical Measurements: Goal: Ability to maintain clinical measurements within normal limits will improve Outcome: Progressing Goal: Will remain free from infection Outcome: Progressing Goal: Diagnostic test results will improve Outcome: Progressing Goal: Respiratory complications will improve Outcome: Progressing Goal: Cardiovascular complication will be avoided Outcome: Progressing   Problem: Activity: Goal: Risk for activity intolerance will decrease Outcome: Progressing   Problem: Nutrition: Goal: Adequate nutrition will be maintained Outcome: Progressing   Problem: Pain Managment: Goal: General experience of comfort will improve Outcome: Progressing   Problem: Skin Integrity: Goal: Risk for impaired skin integrity will decrease Outcome: Progressing

## 2017-10-29 NOTE — Clinical Social Work Note (Signed)
Patient recently assessed on 10/14/17, see assessment below. No recent psychosocial changes reported, patient verbalized plan to dc back to [facility] for LTC. CSW will continue to follow and assist with discharge planning.   Clinical Social Work Assessment  Patient Details  Name: Laura Roberts MRN: [id number] Date of Birth: May 20, 1941  Date of referral:                  Reason for consult:   Discharge Planning                Permission sought to share information with:    Permission granted to share information::     Name::        Agency::     Relationship::     Contact Information:     Housing/Transportation Living arrangements for the past 2 months:    Source of Information:    Patient Interpreter Needed:    Criminal Activity/Legal Involvement Pertinent to Current Situation/Hospitalization:    Significant Relationships:    Lives with:    Do you feel safe going back to the place where you live?    Need for family participation in patient care:     Care giving concerns:  Patient and son Laura Roberts expressed no concerns regarding patient's care at [facility] assessment / plan:  CSW talked with [facility] LCSW Laura Roberts on 415. She confirmed that patient is from [place], is LTC and will return there at discharge. CSW talked with patient and her son (by phone) on 4/16 and patient indicated that she is returning to [facility] and advised CSW that it was fine to talk with her son. Laura Roberts was sitting up in bed and was alert, oriented and engaged easily in conversation with CSW.   Laura Roberts reported that he is looking for a facility in the [place] area. Per son [facility] will be discontinuing services for his mother. He also reported that his mother has Medicaid.  Employment status:    Insurance information:    PT Recommendations:    Information / Referral to community resources:     Patient/Family's Response to care:  Patient reported no concerns regarding her  care during hospitalization.  Patient/Family's Understanding of and Emotional Response to Diagnosis, Current Treatment, and Prognosis:  Patient was informed that she could request her discharge instructions from her nurse.   Emotional Assessment Appearance:   Appears Stated Age Attitude/Demeanor/Rapport:   Cooperative Affect (typically observed):   Calm Orientation:   Oriented to Self, Oriented to Situation, Oriented to Person, Oriented to Place Alcohol / Substance use:   N/A Psych involvement (Current and /or in the community):   No  Discharge Needs  Concerns to be addressed:   Care Coordination  Readmission within the last 30 days:   Yes Current discharge risk:    Barriers to Discharge:   None Identified   Laura Medin, LCSW 10/29/2017, 10:02 AM

## 2017-10-29 NOTE — Progress Notes (Signed)
Nutrition Follow-up  DOCUMENTATION CODES:   Obesity unspecified  INTERVENTION:  - Will decrease Juven to once/day, this packet provides 80 kcal and 14 grams of amino acids.  - Continue Prostat BID, each packet providse 100 kcal and 15 grams of protein. - Will order Premier Protein once/day, this supplement provides 160 kcal and 30 grams of protein.  - Continue to encourage PO intakes.   NUTRITION DIAGNOSIS:   Increased nutrient needs related to wound healing as evidenced by estimated needs. -ongoing  GOAL:   Patient will meet greater than or equal to 90% of their needs -unmet on average  MONITOR:   PO intake, Supplement acceptance, Labs, Weight trends, I & O's, Skin  ASSESSMENT:   77 y.o. female with medical history significant of multiple sclerosis, paraplegia, diabetes mellitus type 2 insulin-dependent, hypertension, spinal stenosis, neurogenic bladder, bilateral foot drop, history of complicated UTIs, history of sacral decubitus ulcer, as well as other comorbidities  who presented to worsening of emergency room with a chief complaint of proximal thigh leg pain.   Weight trending up since admission (+14 lbs). Per chart review, pt consumed 100% of breakfast and 75% of lunch and dinner yesterday. These meals totaled 1130 kcal and 31 grams of protein. Pt has been accepting Prostat and Juven about 50% of the time that they are offered. Will make adjustments to supplements as outlined above.  Medications reviewed; 1 tablet Calcitrate/day, 1000 units vitamin D/day, sliding scale Novolog, 20 units Lantus/day, 4 g IV Mg sulfate x1 run today, 40 mEq oral KCl x2 doses today, 2.5 mg + 5 mg Deltasone every 48 hours, 2 tablets Senokot/day,  Labs reviewed; CBG: 89 mg/dL this AM, K: 3.2 mmol/L, Cl: 94 mmol/L, Mg: 1.5 mg/dL.      Diet Order:   Diet Order           Diet Carb Modified Fluid consistency: Thin; Room service appropriate? Yes  Diet effective now          EDUCATION NEEDS:    No education needs have been identified at this time  Skin:  Skin Assessment: Skin Integrity Issues: Skin Integrity Issues:: Stage III Stage II: L buttocks Stage III: sacrum Unstageable: R heel and R buttocks  Last BM:  4/30  Height:   Ht Readings from Last 1 Encounters:  10/24/17 5\' 2"  (1.575 m)    Weight:   Wt Readings from Last 1 Encounters:  10/29/17 191 lb 9.6 oz (86.9 kg)    Ideal Body Weight:  50 kg  BMI:  Body mass index is 35.04 kg/m.  Estimated Nutritional Needs:   Kcal:  1900-2100  Protein:  80-90g  Fluid:  2L/day       Jarome Matin, MS, RD, LDN, CNSC Inpatient Clinical Dietitian Pager # [phone] After hours/weekend pager # [phone]

## 2017-10-29 NOTE — Progress Notes (Signed)
Patient returning to [facility] for Laura Roberts term care. Facility aware of patient's discharge and confirmed patient's ability to return. PTAR contacted, patient declined for CSW to notify family. Patient's RN can call report to [phone] Room 203A, packet complete. CSW signing off, no other needs identified at this time.  Abundio Miu, [place] Social Worker [facility] Cell#: [phone]

## 2017-10-29 NOTE — Discharge Summary (Signed)
Physician Discharge Summary  Laura Roberts NFA:[id number] DOB: June 12, 1941 DOA: 10/24/2017  PCP: [name], MD  Admit date: 10/24/2017 Discharge date: 10/29/2017  Admitted From: Home Disposition: Skilled nursing facility  Recommendations for Outpatient Follow-up:  1. Right groin wet-to-dry dressings twice daily 2. barrier cream to protect and promote healing to the right ischium and bilateral buttocks 3.  Santyl for medical debridement of nonviable tissue of the right heel 4.  Basic metabolic panel in 2 days to determine need for replacement of electrolytes 5.  Augmentin to be discontinued on 5/5 6. Please contact Dr Dema Severin, [facility] surgery if any questions about follow up- he will place a f/u appt on AVS  Discharge Condition: Stable CODE STATUS: DNR Diet recommendation: Heart healthy, diabetic  Consultations:  General surgery   Discharge Diagnoses:  Principal Problem:   Inguinal abscess  Sepsis ([place]) Active Problems:   Multiple sclerosis ([place])   HTN (hypertension)   GERD (gastroesophageal reflux disease)   Neurogenic bladder  Suprapubic catheter ([place])   Foot drop, bilateral   Paraplegia ([place])   Decubitus ulcers   Hypokalemia   Diabetes mellitus type 2, insulin dependent ([place])   Hypomagnesemia      Brief Summary: Laura Diliberto Smithis a 77 y.o.femalewith medical history significant ofmultiple sclerosis who is paraplegic, diabetes mellitus type 2 insulin-dependent, hypertension, spinal stenosis, neurogenic bladder with suprapubic catheter, complicated UTIs, decubitus ulcers,who presented to worsening of emergency room with a chief complaint of left groin pain and was found to have an abscess of the left groin.  Hospital Course:  Left groin abscess-Sepsis -General surgery consulted on 4/26- I&D performed the same day -Culture growing strep viridans she is currently on Unasyn and will be transitioned to Augmentin to cover strep viridans along with gram  negatives and anaerobes -Wound looks clean today-general surgery has evaluated her wounds and is recommending twice daily dressing changes which will be done at SNF -Protein supplements added by dietitian  Multiple sclerosis with paraplegia and neurogenic bladder with suprapubic catheter -Stable-continue home meds  Diabetes mellitus type 2 Continue Lantus-A1c 6.9-have added an insulin sliding scale which she was not using at home  Multiple decubitus ulcers Wound care recommendations noted and outlined above under recommendations  Hyponatremia/hypochloremia/hypo-kalemia/Hypomagnesemia -Have been replaced- potassium and magnesium replaced again today-repeat basic metabolic panel in 3 days please  Discharge Exam: Vitals:   10/29/17 0501 10/29/17 0557  BP: 139/69 (!) 143/73  Pulse: 99 100  Resp: 18 18  Temp: 98.4 F (36.9 C) 98.1 F (36.7 C)  SpO2: 92% 99%   Vitals:   10/28/17 1316 10/28/17 2158 10/29/17 0501 10/29/17 0557  BP: 124/72 (!) 146/85 139/69 (!) 143/73  Pulse: (!) 104 90 99 100  Resp: 18 16 18 18   Temp: 98.1 F (36.7 C) 97.7 F (36.5 C) 98.4 F (36.9 C) 98.1 F (36.7 C)  TempSrc: Oral Oral Oral Oral  SpO2: 94% 98% 92% 99%  Weight:    86.9 kg (191 lb 9.6 oz)  Height:        General: Pt is alert, awake, not in acute distress Cardiovascular: RRR, S1/S2 +, no rubs, no gallops Respiratory: CTA bilaterally, no wheezing, no rhonchi Abdominal: obese, Soft, NT, ND, bowel sounds + Extremities: no edema, no cyanosis Skin: wound on left groin appears clean   Discharge Instructions  Discharge Instructions    Diet - low sodium heart healthy   Complete by:  As directed    Diet Carb Modified   Complete by:  As directed    Increase activity slowly   Complete by:  As directed      Allergies as of 10/29/2017      Reactions   Codeine Other (See Comments)   Difficult breathing and skin problem   Ultram [tramadol] Other (See Comments)   Difficult breathing and skin  peeling   Januvia [sitagliptin] Rash   Blisters      Medication List    STOP taking these medications   amLODipine 10 MG tablet Commonly known as:  NORVASC   ASPERCREME LIDOCAINE 4 % Ptch Generic drug:  Lidocaine     TAKE these medications   acetaminophen 325 MG tablet Commonly known as:  TYLENOL Take 2 tablets (650 mg total) by mouth every 6 (six) hours as needed for mild pain (or Fever >/= 101). What changed:    when to take this  reasons to take this   amoxicillin-clavulanate 875-125 MG tablet Commonly known as:  AUGMENTIN Take 1 tablet by mouth every 12 (twelve) hours.   ARTIFICIAL TEAR OP Apply 1 drop to eye 4 (four) times daily as needed (dry eyes).   baclofen 10 MG tablet Commonly known as:  LIORESAL 1.5 tablet in the morning, 1 tablet midday, two tablets at night What changed:    how much to take  how to take this  when to take this  additional instructions   bisacodyl 10 MG suppository Commonly known as:  DULCOLAX Place 10 mg rectally daily as needed for moderate constipation.   calcium citrate 950 MG tablet Commonly known as:  CALCITRATE - dosed in mg elemental calcium Take 200 mg of elemental calcium by mouth daily.   cholecalciferol 1000 units tablet Commonly known as:  VITAMIN D Take 1,000 Units by mouth daily.   collagenase ointment Commonly known as:  SANTYL Apply topically daily. Start taking on:  10/30/2017   Dimethyl Fumarate 240 MG Cpdr Commonly known as:  TECFIDERA Take 1 capsule (240 mg total) by mouth 2 (two) times daily.   DULoxetine 30 MG capsule Commonly known as:  CYMBALTA Take 90 mg by mouth daily.   eucerin cream Apply 1 application topically 2 (two) times daily.   feeding supplement (PRO-STAT SUGAR FREE 64) Liqd Take 30 mLs by mouth 2 (two) times daily.   gabapentin 300 MG capsule Commonly known as:  NEURONTIN Take 1 capsule (300 mg total) by mouth 2 (two) times daily.   insulin aspart 100 UNIT/ML  injection Commonly known as:  novoLOG Inject 0-15 Units into the skin 3 (three) times daily with meals.   insulin glargine 100 UNIT/ML injection Commonly known as:  LANTUS Inject 0.2 mLs (20 Units total) into the skin at bedtime. What changed:  how much to take   magnesium hydroxide 400 MG/5ML suspension Commonly known as:  MILK OF MAGNESIA Take 30 mLs by mouth daily as needed for mild constipation.   nutrition supplement (JUVEN) Pack Take 1 packet by mouth daily. Start taking on:  10/30/2017   pantoprazole 40 MG tablet Commonly known as:  PROTONIX Take 40 mg by mouth daily.   polyethylene glycol packet Commonly known as:  MIRALAX / GLYCOLAX Take 17 g by mouth daily as needed for mild constipation.   predniSONE 5 MG tablet Commonly known as:  DELTASONE 1 tablet on odd days, one half tablet on even days What changed:    how much to take  how to take this  when to take this  additional instructions   protein supplement  shake Liqd Commonly known as:  PREMIER PROTEIN Take 325 mLs (11 oz total) by mouth daily.   senna-docusate 8.6-50 MG tablet Commonly known as:  Senokot-S Take 2 tablets by mouth at bedtime.   tolterodine 4 MG 24 hr capsule Commonly known as:  DETROL LA Take 4 mg by mouth daily.      Contact information for after-discharge care    Destination    [facility] .   Service:  Skilled Nursing Contact information: [address] [address] [phone]             Allergies  Allergen Reactions  . Codeine Other (See Comments)    Difficult breathing and skin problem  . Ultram [Tramadol] Other (See Comments)    Difficult breathing and skin peeling  . Januvia [Sitagliptin] Rash    Blisters     Procedures/Studies: I and D 4/26  Dg Chest 2 View  Result Date: 10/09/2017 CLINICAL DATA:  Chest pain.  Recent cough and fever. EXAM: CHEST - 2 VIEW COMPARISON:  Chest x-ray 08/13/2017. FINDINGS:  Mediastinum hilar structures normal. Heart size stable. Low lung volumes with mild basilar atelectasis. No pleural effusion or pneumothorax. Thoracic spine degenerative changes and scoliosis. IMPRESSION: No acute cardiopulmonary disease. Electronically Signed   By: Marcello Moores  Register   On: 10/09/2017 11:06   Ct Pelvis W Contrast  Result Date: 10/24/2017 CLINICAL DATA:  LEFT groin abscess into LEFT thigh EXAM: CT PELVIS WITH CONTRAST TECHNIQUE: Multidetector CT imaging of the pelvis was performed using the standard protocol following the bolus administration of intravenous contrast. Sagittal and coronal MPR images reconstructed from axial data set. CONTRAST:  146mL ISOVUE-300 IOPAMIDOL (ISOVUE-300) INJECTION 61% IV COMPARISON:  CT pelvis 09/09/2017 FINDINGS: Urinary Tract: Suprapubic catheter. Inferior pole RIGHT kidney normal appearance. Ureters and decompressed bladder otherwise unremarkable. Bowel: Prominent stool in rectum. Remaining visualized pelvic bowel loops unremarkable. Vascular/Lymphatic: Scattered atherosclerotic calcifications throughout the iliac systems and at distal aorta. Few scattered pelvic phleboliths. Reproductive: Uterus surgically absent. Normal sized ovaries bilaterally. Other: Small umbilical hernia containing fat. Irregular fluid collection with enhancing margins and septations identified in the medial proximal LEFT thigh, extending from anterior to the LEFT pubic body inferiorly to the level of the proximal to mid LEFT femoral diaphysis, collection overall measuring 9.0 x 5.8 x >11.1 cm; inferior extent is not imaged on this exam. Finding is consistent with an abscess. The collection with surrounding inflammation extends to abut the anterior aspect of the LEFT pubic body. No intrapelvic extension. Tissue planes surrounding the bladder and rectum appear clear. Musculoskeletal: Diffuse osseous demineralization. Diffuse muscular atrophy. No bone destruction at the pubic body to suggest  osteomyelitis though the inflammation extends to the anterior margin. Hip joints preserved. SI joints symmetric. IM nail proximal RIGHT femur. Calcification identified at the RIGHT ileo psoas muscle at the musculotendinous junction question sequela of prior trauma. Additionally, subcutaneous infiltration with minimal gas noted at the posterior inferior RIGHT buttock, extending towards the RIGHT ischium compatible with a decubitus ulcer; no drainable fluid collection seen. IMPRESSION: Multiloculated fluid collection with irregular enhancing margins and septations identified at the medial aspect of the upper LEFT thigh, extending from the LEFT pubic body to the level of the proximal to mid LEFT femoral diaphysis, 9.0 x 5.8 x 11.1 cm consistent with abscess. Inferior extent is not imaged; if further imaging is required distally, may consider MR or CT. Prominent stool in rectum. Small umbilical hernia containing fat. No intrapelvic abnormalities  or intrapelvic extension of inflammatory process seen. Inferior RIGHT gluteal decubitus ulcer. Electronically Signed   By: Lavonia Dana M.D.   On: 10/24/2017 16:45   Ir Catheter Tube Change  Result Date: 10/13/2017 INDICATION: History of prostate cancer, post ultrasound fluoroscopic guided suprapubic catheter placement on 03/21/2015. Patient has subsequent undergone multiple fluoroscopic guided suprapubic catheter exchanges and up sizing. Patient returns today for routine exchange. History of advanced multiple sclerosis with non functionality of the lower extremities and chronic incontinence post ultrasound CT-guided placement of a suprapubic catheter on 09/09/2017 Patient presents today for fluoroscopic guided exchange and up sizing. EXAM: FLUOROSCOPIC GUIDED SUPRAPUBIC CATHETER EXCHANGE AND CONVERSION COMPARISON:  CT and ultrasound guided suprapubic catheter placement - 09/09/2017 MEDICATIONS: None ANESTHESIA/SEDATION: Moderate (conscious) sedation was employed during this  procedure. A total of Versed 1 mg and Fentanyl 25 mcg was administered intravenously. Moderate Sedation Time: 10 minutes. The patient's level of consciousness and vital signs were monitored continuously by radiology nursing throughout the procedure under my direct supervision. CONTRAST:  None FLUOROSCOPY TIME:  24 seconds (1 mGy) COMPLICATIONS: None immediate. PROCEDURE: The procedure, risks, benefits, and alternatives were explained to the patient. Questions regarding the procedure were encouraged and answered. The patient understands and consents to the procedure. A timeout was performed prior to the initiation of the procedure. The external portion of the existing suprapubic catheter as well as the surrounding skin were prepped and draped in usual sterile fashion A preprocedural spot fluoroscopic image was obtained of the existing 12 [place] all-purpose drainage catheter with and coiled and locked overlying expected location of the urinary bladder. Contrast injection confirmed appropriate positioning and functionality existing suprapubic catheter. The external portion of the existing suprapubic catheter was cut and cannulated with a short Amplatz wire which was coiled within the urinary bladder. Under intermittent fluoroscopic guidance, the existing 12 [place] all-purpose drainage catheter was exchanged for a new 53 French catheter with end coiled and locked within the urinary bladder. Appropriate position was confirmed with the injection of a small amount of contrast as well as a efflux of urine. The catheter was connected to a gravity bag. A dressing was placed. The patient tolerated the procedure well without immediate postprocedural complication. FINDINGS: Preprocedural imaging demonstrates unchanged positioning of all-purpose drainage catheter with end coiled and locked over the expected location the urinary bladder. Contrast injection confirms appropriate positioning and functionality of the existing  percutaneous drainage catheter. After fluoroscopic guided exchange, a new, slightly larger now 40 French catheter is appropriately positioned within the urinary bladder. IMPRESSION: Technically successful fluoroscopic guided exchange and up sizing of now 14 French suprapubic catheter. PLAN: Would consider continued up sizing of percutaneous gastrostomy tube on future routine exchanges as clinically indicated. Electronically Signed   By: Sandi Mariscal M.D.   On: 10/13/2017 17:03     The results of significant diagnostics from this hospitalization (including imaging, microbiology, ancillary and laboratory) are listed below for reference.     Microbiology: Recent Results (from the past 240 hour(s))  Aerobic/Anaerobic Culture (surgical/deep wound)     Status: None (Preliminary result)   Collection Time: 10/24/17  7:13 PM  Result Value Ref Range Status   Specimen Description   Final    ABSCESS LEFT THIGH Performed at [facility] [address] [place], [place] [address]    Special Requests   Final    NONE Performed at [facility], [place] [address] [place],  [address]    Gram Stain  Final    ABUNDANT WBC PRESENT, PREDOMINANTLY PMN ABUNDANT GRAM POSITIVE COCCI IN PAIRS ABUNDANT GRAM NEGATIVE RODS FEW GRAM POSITIVE RODS    Culture   Final    ABUNDANT VIRIDANS STREPTOCOCCUS HOLDING FOR POSSIBLE ANAEROBE Performed at [facility], [address] [place], [place] [address]    Report Status PENDING  Incomplete  Culture, blood (routine x 2)     Status: None   Collection Time: 10/24/17  9:04 PM  Result Value Ref Range Status   Specimen Description   Final    BLOOD RIGHT ARM Performed at [facility] [address] [place], [place] [address]    Special Requests   Final    BOTTLES DRAWN AEROBIC AND ANAEROBIC Blood Culture adequate volume Performed at [facility] [address] [place], [place]  [address]    Culture   Final    NO GROWTH 5 DAYS Performed at [facility], [place] [address], [place], [place] [address]    Report Status 10/29/2017 FINAL  Final  Culture, blood (routine x 2)     Status: None   Collection Time: 10/24/17  9:04 PM  Result Value Ref Range Status   Specimen Description   Final    BLOOD RIGHT HAND Performed at [facility] [address], [place], [place] [address]    Special Requests   Final    BOTTLES DRAWN AEROBIC ONLY Blood Culture adequate volume Performed at [facility] [address], [place], [place] [address]    Culture   Final    NO GROWTH 5 DAYS Performed at [facility], [place] [address] [place], [place] [address]    Report Status 10/29/2017 FINAL  Final  Urine culture     Status: None   Collection Time: 10/25/17  5:14 AM  Result Value Ref Range Status   Specimen Description   Final    URINE, RANDOM Performed at [facility] [address], [place], [place] [address]    Special Requests   Final    NONE Performed at [facility], [place] [address] [place], [place] [address]    Culture   Final    NO GROWTH Performed at [facility], [place] [address], [place], [place] [address]    Report Status 10/26/2017 FINAL  Final     Labs: BNP (last 3 results) No results for input(s): BNP in the last 8760 hours. Basic Metabolic Panel: Recent Labs  Lab 10/25/17 0456 10/25/17 0953 10/26/17 0551 10/27/17 0536 10/28/17 0554 10/29/17 0530  NA 134*  --  138 140 142 141  K 2.8*  --  4.2 3.7 3.6 3.2*  CL 91*  --  95* 97* 97* 94*  CO2 27  --  32 33* 34* 33*  GLUCOSE 212*  --  83 96 142* 92  BUN 8  --  8 13 12 12   CREATININE 0.69  --  0.48 0.54 0.49 0.47  CALCIUM 8.9  --  9.2 9.1 8.9 9.0  MG  --  1.4* 1.9 1.7 1.5* 1.5*  PHOS  --  3.1 2.4* 3.2 3.3 2.7   Liver Function Tests: Recent Labs  Lab 10/25/17 0456 10/26/17 0551 10/27/17 0536 10/28/17 0554  10/29/17 0530  AST 9* 15 13* 12* 14*  ALT QUANTITY NOT SUFFICIENT, UNABLE TO PERFORM TEST 10* 9* 8* 9*  ALKPHOS 76 71 70 66 67  BILITOT 0.9 0.6 0.4  0.5 0.4  PROT 5.7* 6.5 6.1* 6.0* 6.3*  ALBUMIN 2.0* 2.2* 2.2* 2.1* 2.3*   No results for input(s): LIPASE, AMYLASE in the last 168 hours. No results for input(s): AMMONIA in the last 168 hours. CBC: Recent Labs  Lab 10/24/17 1509 10/25/17 0456 10/26/17 0551 10/27/17 0536 10/28/17 0554 10/29/17 0530  WBC 24.2* 26.2* 12.2* 8.9 9.2 12.0*  NEUTROABS 21.5*  --  10.4* 6.3 6.0 7.5  HGB 10.5* 9.6* 9.0* 9.4* 8.8* 9.0*  HCT 32.1* 29.6* 28.8* 29.9* 28.1* 29.1*  MCV 78.9 79.4 80.7 81.3 81.9 81.1  PLT 634* 481* PLATELET CLUMPS NOTED ON SMEAR, COUNT APPEARS INCREASED 701* 610* 677*   Cardiac Enzymes: No results for input(s): CKTOTAL, CKMB, CKMBINDEX, TROPONINI in the last 168 hours. BNP: Invalid input(s): POCBNP CBG: Recent Labs  Lab 10/28/17 1130 10/28/17 1715 10/28/17 2020 10/29/17 0803 10/29/17 1117  GLUCAP 197* 168* 148* 89 125*   D-Dimer No results for input(s): DDIMER in the last 72 hours. Hgb A1c No results for input(s): HGBA1C in the last 72 hours. Lipid Profile No results for input(s): CHOL, HDL, LDLCALC, TRIG, CHOLHDL, LDLDIRECT in the last 72 hours. Thyroid function studies No results for input(s): TSH, T4TOTAL, T3FREE, THYROIDAB in the last 72 hours.  Invalid input(s): FREET3 Anemia work up No results for input(s): VITAMINB12, FOLATE, FERRITIN, TIBC, IRON, RETICCTPCT in the last 72 hours. Urinalysis    Component Value Date/Time   COLORURINE YELLOW 10/25/2017 0514   APPEARANCEUR CLEAR 10/25/2017 0514   LABSPEC 1.036 (H) 10/25/2017 0514   PHURINE 5.0 10/25/2017 0514   GLUCOSEU NEGATIVE 10/25/2017 0514   HGBUR SMALL (A) 10/25/2017 0514   BILIRUBINUR NEGATIVE 10/25/2017 0514   KETONESUR 20 (A) 10/25/2017 0514   PROTEINUR NEGATIVE 10/25/2017 0514   UROBILINOGEN 1.0 08/04/2012 1938   NITRITE NEGATIVE 10/25/2017  0514   LEUKOCYTESUR SMALL (A) 10/25/2017 0514   Sepsis Labs Invalid input(s): PROCALCITONIN,  WBC,  LACTICIDVEN Microbiology Recent Results (from the past 240 hour(s))  Aerobic/Anaerobic Culture (surgical/deep wound)     Status: None (Preliminary result)   Collection Time: 10/24/17  7:13 PM  Result Value Ref Range Status   Specimen Description   Final    ABSCESS LEFT THIGH Performed at [facility], [place] [address], [place], [place] [address]    Special Requests   Final    NONE Performed at [facility], [place] [address] [place], [place] [address]    Gram Stain   Final    ABUNDANT WBC PRESENT, PREDOMINANTLY PMN ABUNDANT GRAM POSITIVE COCCI IN PAIRS ABUNDANT GRAM NEGATIVE RODS FEW GRAM POSITIVE RODS    Culture   Final    ABUNDANT VIRIDANS STREPTOCOCCUS HOLDING FOR POSSIBLE ANAEROBE Performed at [facility], [place] [address], [place], [place] [address]    Report Status PENDING  Incomplete  Culture, blood (routine x 2)     Status: None   Collection Time: 10/24/17  9:04 PM  Result Value Ref Range Status   Specimen Description   Final    BLOOD RIGHT ARM Performed at [facility] [address], [place], [place] [address]    Special Requests   Final    BOTTLES DRAWN AEROBIC AND ANAEROBIC Blood Culture adequate volume Performed at [facility] [address] [place], [place] [address]    Culture   Final    NO GROWTH 5 DAYS Performed at [facility], [place] [address], [place], [place] [address]  Report Status 10/29/2017 FINAL  Final  Culture, blood (routine x 2)     Status: None   Collection Time: 10/24/17  9:04 PM  Result Value Ref Range Status   Specimen Description   Final    BLOOD RIGHT HAND Performed at [facility] [address] [place], [place] [address]    Special Requests   Final    BOTTLES DRAWN AEROBIC ONLY Blood Culture adequate volume Performed at  [facility] [address] [place], [place] [address]    Culture   Final    NO GROWTH 5 DAYS Performed at [facility], [place] [address], [place], [place] [address]    Report Status 10/29/2017 FINAL  Final  Urine culture     Status: None   Collection Time: 10/25/17  5:14 AM  Result Value Ref Range Status   Specimen Description   Final    URINE, RANDOM Performed at [facility] [address], [place], [place] [address]    Special Requests   Final    NONE Performed at [facility], [place] [address], [place], [place] [address]    Culture   Final    NO GROWTH Performed at [facility], [place] [address], [place],  [address]    Report Status 10/26/2017 FINAL  Final     Time coordinating discharge: 22  SIGNED:   Debbe Odea, MD  [facility] 10/29/2017, 1:03 PM Pager   If 7PM-7AM, please contact night-coverage [email or web address] Password [email or web address]

## 2017-10-30 ENCOUNTER — Encounter: Payer: Self-pay | Admitting: Internal Medicine

## 2017-10-30 ENCOUNTER — Non-Acute Institutional Stay (SKILLED_NURSING_FACILITY): Payer: Medicare Other | Admitting: Internal Medicine

## 2017-10-30 DIAGNOSIS — L89314 Pressure ulcer of right buttock, stage 4: Secondary | ICD-10-CM | POA: Diagnosis not present

## 2017-10-30 DIAGNOSIS — R7989 Other specified abnormal findings of blood chemistry: Secondary | ICD-10-CM

## 2017-10-30 DIAGNOSIS — E119 Type 2 diabetes mellitus without complications: Secondary | ICD-10-CM | POA: Diagnosis not present

## 2017-10-30 DIAGNOSIS — L02214 Cutaneous abscess of groin: Secondary | ICD-10-CM

## 2017-10-30 DIAGNOSIS — Z794 Long term (current) use of insulin: Secondary | ICD-10-CM | POA: Diagnosis not present

## 2017-10-30 NOTE — Assessment & Plan Note (Signed)
Subclinical hyperthyroidism appears to have resolved based on  follow-up TSH

## 2017-10-30 NOTE — Assessment & Plan Note (Signed)
Wound care  @ SNF

## 2017-10-30 NOTE — Assessment & Plan Note (Signed)
Continue Augmentin until 11/02/17 Wound Care at the SNF

## 2017-10-30 NOTE — Progress Notes (Signed)
NURSING HOME LOCATION:  [facility] ROOM NUMBER:  [id number]  CODE STATUS:  DNR  PCP:  [name], MD  [place] [address]  This is a [place] readmission within 30 days.  Interim medical record and care since last [place] visit was updated with review of diagnostic studies and change in clinical status since last visit were documented.  HPI: The patient had been hospitalized 6/62-9/47/65 with a UTI complicated by hypokalemia and hypomagnesemia. She was readmitted 4/26-10/29/17 with an inguinal abscess with sepsis. The abscess of the left groin was in the context of multiple comorbidities including multiple sclerosis with paraplegia, IDDM, hypertension, spinal stenosis, neurogenic bladder with suprapubic catheter, recurrent UTIs, decubital ulcers and GERD. Incision and drainage was performed 4/26 by general surgery. Cultures grew strep viridans prompting transition from Unasyn to Augmentin. Surgery recommended twice daily dressing changes which can be done at the SNF. Caloric protein malnutrition was addressed with protein supplements. The patient is on basal insulin with current  A1c 6.9%.SSI added as IP. Electrolyte abnormalities including hyponatremia, hypokalemia, and hypomagnesemia were corrected. Last labs revealed potassium 3.7, and magnesium 1.7. Renal function was normal. Hemoglobin was 9.4 & hematocrit 29.9 with normocytic, minimally hypochromic indices. Here at the SNF glucoses have been markedly variable with fastings from 70-219 and evening glucoses up to 306.  Review of systems: She is having spasms in the left lower extremity in the area of the groin abscess.  She is chronically  intolerant to heat. She states she is to see her endocrinologist tomorrow concerning her thyroid. On 10/24/17 TSH was 0.182, therapeutic or normal.  On 08/15/17 her TSH was low at 0.052.Free T4 was mildly elevated at 1.71 & T3 normal @ 48. She was  unable to give me the name of the endocrinologist and I can find no notes in Epic concerning thyroid dysfunction. She is not on thyroid replacement. Also not on amiodarone.  Constipation is controlled with medications. She does admit to depression despite Cymbalta 90 mg daily. She's concerned about changes in insurance and continuing coverage of her multiple sclerosis medications from Dr. Jannifer Franklin.   Constitutional: No fever, significant weight change  Eyes: No redness, discharge, pain, vision change ENT/mouth: No nasal congestion,  purulent discharge, earache, change in hearing, sore throat  Cardiovascular: No chest pain, palpitations, paroxysmal nocturnal dyspnea, claudication, edema  Respiratory: No cough, sputum production, hemoptysis, DOE , significant snoring, apnea   Gastrointestinal: No heartburn, dysphagia, abdominal pain, nausea /vomiting, rectal bleeding, melena Genitourinary: No dysuria, hematuria, pyuria, incontinence, nocturia Musculoskeletal: No joint stiffness, joint swelling Dermatologic: No new rash, pruritus, change in appearance of skin Neurologic: No dizziness, headache, syncope, seizures Psychiatric: No insomnia, anorexia Endocrine: No change in hair/skin/ nails, excessive thirst, excessive hunger, excessive urination  Hematologic/lymphatic: No significant bruising, lymphadenopathy, abnormal bleeding Allergy/immunology: No itchy/watery eyes, significant sneezing, urticaria, angioedema  Physical exam:  Pertinent or positive findings: She is a very articulate individual. She is alert and oriented but her affect is flat. Arcus senilis is present. There is slight decreased left nasolabial fold. She has multiple missing upper teeth. She has a partial but is not wearing it. She has 1/2+ edema at the sock line. Pedal pulses are decreased. There is interosseous wasting between the thumb and index fingers bilaterally. Strength in the upper extremities is asymmetric, she is stronger on  the right. She has no range of motion or significant strength of lower extremities.  General appearance: Adequately nourished; no acute  distress, increased work of breathing is present.   Lymphatic: No lymphadenopathy about the head, neck, axilla. Eyes: No conjunctival inflammation or lid edema is present. There is no scleral icterus. Ears:  External ear exam shows no significant lesions or deformities.   Nose:  External nasal examination shows no deformity or inflammation. Nasal mucosa are pink and moist without lesions, exudates Oral exam:  Lips and gums are healthy appearing. There is no oropharyngeal erythema or exudate. Neck:  No thyromegaly, masses, tenderness noted.    Heart:  Normal rate and regular rhythm. S1 and S2 normal without gallop, murmur, click, rub .  Lungs: without wheezes, rhonchi,rales , rubs. Abdomen:Bowel sounds are normal. Abdomen is soft and nontender with no organomegaly, hernias,masses. GU: deferred  Extremities:  No cyanosis, clubbing Neurologic exam : Balance,Rhomberg,finger to nose testing could not be completed due to clinical state Skin: Warm & dry w/o tenting.  See summary under each active problem in the Problem List with associated updated therapeutic plan

## 2017-10-30 NOTE — Patient Instructions (Signed)
See assessment and plan under each diagnosis in the problem list and acutely for this visit 

## 2017-10-30 NOTE — Assessment & Plan Note (Signed)
Complicated sliding scale will be changed to less stringent protocol to prevent hypoglycemia

## 2017-10-31 ENCOUNTER — Telehealth: Payer: Self-pay

## 2017-10-31 DIAGNOSIS — G8220 Paraplegia, unspecified: Secondary | ICD-10-CM | POA: Diagnosis not present

## 2017-10-31 DIAGNOSIS — R293 Abnormal posture: Secondary | ICD-10-CM | POA: Diagnosis not present

## 2017-10-31 DIAGNOSIS — N312 Flaccid neuropathic bladder, not elsewhere classified: Secondary | ICD-10-CM | POA: Diagnosis not present

## 2017-10-31 DIAGNOSIS — R489 Unspecified symbolic dysfunctions: Secondary | ICD-10-CM | POA: Diagnosis not present

## 2017-10-31 DIAGNOSIS — G35 Multiple sclerosis: Secondary | ICD-10-CM | POA: Diagnosis not present

## 2017-10-31 DIAGNOSIS — M6281 Muscle weakness (generalized): Secondary | ICD-10-CM | POA: Diagnosis not present

## 2017-10-31 NOTE — Telephone Encounter (Signed)
Possible re-admission to facility. This is a patient you were seeing at [facility]. [facility] F/U is needed if patient was re-admitted to facility upon discharge. Hospital discharge from [facility] on 10/29/2017

## 2017-11-03 DIAGNOSIS — L89152 Pressure ulcer of sacral region, stage 2: Secondary | ICD-10-CM | POA: Diagnosis not present

## 2017-11-03 DIAGNOSIS — G8220 Paraplegia, unspecified: Secondary | ICD-10-CM | POA: Diagnosis not present

## 2017-11-03 DIAGNOSIS — R293 Abnormal posture: Secondary | ICD-10-CM | POA: Diagnosis not present

## 2017-11-03 DIAGNOSIS — T8189XD Other complications of procedures, not elsewhere classified, subsequent encounter: Secondary | ICD-10-CM | POA: Diagnosis not present

## 2017-11-03 DIAGNOSIS — L89613 Pressure ulcer of right heel, stage 3: Secondary | ICD-10-CM | POA: Diagnosis not present

## 2017-11-03 DIAGNOSIS — M6281 Muscle weakness (generalized): Secondary | ICD-10-CM | POA: Diagnosis not present

## 2017-11-03 DIAGNOSIS — G35 Multiple sclerosis: Secondary | ICD-10-CM | POA: Diagnosis not present

## 2017-11-03 DIAGNOSIS — R489 Unspecified symbolic dysfunctions: Secondary | ICD-10-CM | POA: Diagnosis not present

## 2017-11-04 DIAGNOSIS — G8220 Paraplegia, unspecified: Secondary | ICD-10-CM | POA: Diagnosis not present

## 2017-11-04 DIAGNOSIS — R489 Unspecified symbolic dysfunctions: Secondary | ICD-10-CM | POA: Diagnosis not present

## 2017-11-04 DIAGNOSIS — G35 Multiple sclerosis: Secondary | ICD-10-CM | POA: Diagnosis not present

## 2017-11-04 DIAGNOSIS — R293 Abnormal posture: Secondary | ICD-10-CM | POA: Diagnosis not present

## 2017-11-04 DIAGNOSIS — M6281 Muscle weakness (generalized): Secondary | ICD-10-CM | POA: Diagnosis not present

## 2017-11-05 DIAGNOSIS — G8220 Paraplegia, unspecified: Secondary | ICD-10-CM | POA: Diagnosis not present

## 2017-11-05 DIAGNOSIS — R293 Abnormal posture: Secondary | ICD-10-CM | POA: Diagnosis not present

## 2017-11-05 DIAGNOSIS — R489 Unspecified symbolic dysfunctions: Secondary | ICD-10-CM | POA: Diagnosis not present

## 2017-11-05 DIAGNOSIS — M6281 Muscle weakness (generalized): Secondary | ICD-10-CM | POA: Diagnosis not present

## 2017-11-05 DIAGNOSIS — G35 Multiple sclerosis: Secondary | ICD-10-CM | POA: Diagnosis not present

## 2017-11-06 DIAGNOSIS — G8220 Paraplegia, unspecified: Secondary | ICD-10-CM | POA: Diagnosis not present

## 2017-11-06 DIAGNOSIS — G35 Multiple sclerosis: Secondary | ICD-10-CM | POA: Diagnosis not present

## 2017-11-06 DIAGNOSIS — R489 Unspecified symbolic dysfunctions: Secondary | ICD-10-CM | POA: Diagnosis not present

## 2017-11-06 DIAGNOSIS — R293 Abnormal posture: Secondary | ICD-10-CM | POA: Diagnosis not present

## 2017-11-06 DIAGNOSIS — M6281 Muscle weakness (generalized): Secondary | ICD-10-CM | POA: Diagnosis not present

## 2017-11-07 DIAGNOSIS — R489 Unspecified symbolic dysfunctions: Secondary | ICD-10-CM | POA: Diagnosis not present

## 2017-11-07 DIAGNOSIS — R293 Abnormal posture: Secondary | ICD-10-CM | POA: Diagnosis not present

## 2017-11-07 DIAGNOSIS — G35 Multiple sclerosis: Secondary | ICD-10-CM | POA: Diagnosis not present

## 2017-11-07 DIAGNOSIS — M6281 Muscle weakness (generalized): Secondary | ICD-10-CM | POA: Diagnosis not present

## 2017-11-07 DIAGNOSIS — G8220 Paraplegia, unspecified: Secondary | ICD-10-CM | POA: Diagnosis not present

## 2017-11-08 ENCOUNTER — Encounter (HOSPITAL_COMMUNITY): Payer: Self-pay | Admitting: Emergency Medicine

## 2017-11-08 ENCOUNTER — Emergency Department (HOSPITAL_COMMUNITY)
Admission: EM | Admit: 2017-11-08 | Discharge: 2017-11-09 | Disposition: A | Discharge status: Home / Self Care | Payer: Medicare Other | Attending: Emergency Medicine | Admitting: Emergency Medicine

## 2017-11-08 DIAGNOSIS — Z794 Long term (current) use of insulin: Secondary | ICD-10-CM | POA: Diagnosis not present

## 2017-11-08 DIAGNOSIS — Y828 Other medical devices associated with adverse incidents: Secondary | ICD-10-CM | POA: Diagnosis not present

## 2017-11-08 DIAGNOSIS — T83198A Other mechanical complication of other urinary devices and implants, initial encounter: Secondary | ICD-10-CM | POA: Diagnosis not present

## 2017-11-08 DIAGNOSIS — I10 Essential (primary) hypertension: Secondary | ICD-10-CM | POA: Diagnosis not present

## 2017-11-08 DIAGNOSIS — T83098A Other mechanical complication of other indwelling urethral catheter, initial encounter: Secondary | ICD-10-CM | POA: Insufficient documentation

## 2017-11-08 DIAGNOSIS — Z79899 Other long term (current) drug therapy: Secondary | ICD-10-CM | POA: Insufficient documentation

## 2017-11-08 DIAGNOSIS — N3001 Acute cystitis with hematuria: Secondary | ICD-10-CM | POA: Diagnosis not present

## 2017-11-08 DIAGNOSIS — E119 Type 2 diabetes mellitus without complications: Secondary | ICD-10-CM | POA: Insufficient documentation

## 2017-11-08 DIAGNOSIS — T83010A Breakdown (mechanical) of cystostomy catheter, initial encounter: Secondary | ICD-10-CM

## 2017-11-08 DIAGNOSIS — T83498A Other mechanical complication of other prosthetic devices, implants and grafts of genital tract, initial encounter: Secondary | ICD-10-CM | POA: Diagnosis not present

## 2017-11-08 DIAGNOSIS — R350 Frequency of micturition: Secondary | ICD-10-CM | POA: Diagnosis present

## 2017-11-08 NOTE — ED Provider Notes (Signed)
[facility] EMERGENCY DEPARTMENT Provider Note   CSN: [id number] Arrival date & time: 11/08/17  2212     History   Chief Complaint Chief Complaint  Patient presents with  . Urinary Frequency    HPI Laura Roberts is a 77 y.o. female with a history of diabetes, multiple sclerosis, wheelchair dependence who presents emergency department today for drainage from suprapubic catheter.  Patient recently had suprapubic catheter placed in March.  She notes she recently finished antibiotics for UTI.  She reports over the last 3 days she has had mild drainage from the suprapubic catheter insertion site.  She has not tried anything for this.  Nothing makes her symptoms better or worse.  She denies any flank pain, abdominal pain, nausea, vomiting or fever.  She reports her urine is normal color.  No changes around superior catheter insertion site.  HPI  Past Medical History:  Diagnosis Date  . Degenerative arthritis   . Diabetes mellitus without complication ([place])   . Gait disturbance   . Hypertension   . Multiple sclerosis ([place])   . Neuromuscular disorder ([facility])    Bilateral hand carpal tunnel syndrome  . Obesity   . Spinal stenosis in cervical region   . Spinal stenosis of lumbosacral region   . Spinal stenosis, thoracic     Patient Active Problem List   Diagnosis Date Noted  . Abnormal TSH 10/30/2017  . Suprapubic catheter ([place]) 10/29/2017  . Inguinal abscess 10/24/2017  . Hypokalemia   . Diabetes mellitus type 2, insulin dependent ([place])   . Hypomagnesemia   . UTI (urinary tract infection) 10/09/2017  . Complicated UTI (urinary tract infection) 10/09/2017  . Febrile illness   . UTI (urinary tract infection) due to urinary indwelling Foley catheter ([place]) 08/13/2017  . Decubitus ulcer of buttock, stage 4 ([place]) 11/22/2016  . Sepsis ([place]) 11/22/2016  . Leukocytosis   . Paraplegia ([place]) 06/01/2014  . Closed supracondylar fracture of right femur, periprosthetic  06/01/2014  . Femoral fracture ([place]) 06/01/2014  . Encounter for therapeutic drug monitoring 01/05/2014  . Foot drop, bilateral 10/13/2012  . Neurogenic bladder 09/02/2012  . Neurogenic pain of lower extremity 09/02/2012  . HTN (hypertension) 07/21/2012  . HLD (hyperlipidemia) 07/21/2012  . GERD (gastroesophageal reflux disease) 07/21/2012  . Multiple sclerosis ([place])     Past Surgical History:  Procedure Laterality Date  . ABDOMINAL HYSTERECTOMY    . BACK SURGERY    . CATARACT EXTRACTION Right   . CHOLECYSTECTOMY    . FEMUR IM NAIL Right 06/02/2014   Procedure: INTRAMEDULLARY (IM) RETROGRADE FEMORAL NAILING;  Surgeon: Johnny Bridge, MD;  Location: [facility];  Service: Orthopedics;  Laterality: Right;  . GROIN DISSECTION Left 10/24/2017   Procedure: IRRIGATION AND DEBRIDEMENT, LEFT THIGH ABCESS ;  Surgeon: Alphonsa Overall, MD;  Location: [facility];  Service: General;  Laterality: Left;  . IR CATHETER TUBE CHANGE  10/13/2017  . left hand carpal tunnel surgery    . REPLACEMENT TOTAL KNEE BILATERAL  2004     OB History   None      Home Medications    Prior to Admission medications   Medication Sig Start Date End Date Taking? Authorizing Provider  acetaminophen (TYLENOL) 325 MG tablet Take 650 mg by mouth every 6 (six) hours as needed.     [provider]  Amino Acids-Protein Hydrolys (FEEDING SUPPLEMENT, PRO-STAT SUGAR FREE 64,) LIQD Take 30 mLs by mouth 2 (two) times daily. 11/26/16   Minus Liberty,  MD  amoxicillin-clavulanate (AUGMENTIN) 875-125 MG tablet Take 1 tablet by mouth every 12 (twelve) hours. 10/29/17   Debbe Odea, MD  ARTIFICIAL TEAR OP Apply 1 drop to eye 4 (four) times daily as needed (dry eyes).     [provider]  baclofen (LIORESAL) 10 MG tablet 1.5 tablet in the morning, 1 tablet midday, two tablets at night 11/29/15   Ward Givens, NP  bisacodyl (DULCOLAX) 10 MG suppository Place 10 mg rectally daily as needed for moderate constipation.     [provider]  calcium citrate (CALCITRATE - DOSED IN MG ELEMENTAL CALCIUM) 950 MG tablet Take 200 mg of elemental calcium by mouth daily.    [provider]  cholecalciferol (VITAMIN D) 1000 UNITS tablet Take 1,000 Units by mouth daily.    [provider]  collagenase (SANTYL) ointment Apply topically daily. 10/30/17   Debbe Odea, MD  Dimethyl Fumarate (TECFIDERA) 240 MG CPDR Take 1 capsule (240 mg total) by mouth 2 (two) times daily. 03/13/16   Kathrynn Ducking, MD  DULoxetine (CYMBALTA) 30 MG capsule Take 90 mg by mouth daily.     [provider]  gabapentin (NEURONTIN) 300 MG capsule Take 1 capsule (300 mg total) by mouth 2 (two) times daily. 08/18/17   Colbert Ewing, MD  Insulin Aspart (NOVOLOG FLEXPEN [place]) Inject 2-15 Units into the skin 3 (three) times daily before meals. SSI:  121-150 = 2 units, 151-300 = 3 units, 201-250 = 5 units, 251-300 = 8 units, 301-350 = 11 units, 351-400 = 15 units, greater than 400 notify provider    [provider]  insulin glargine (LANTUS) 100 UNIT/ML injection Inject 0.2 mLs (20 Units total) into the skin at bedtime. 10/14/17   Katherine Roan, MD  magnesium hydroxide (MILK OF MAGNESIA) 400 MG/5ML suspension Take 30 mLs by mouth daily as needed for mild constipation.    [provider]  nutrition supplement, JUVEN, (JUVEN) PACK Take 1 packet by mouth daily. 10/30/17   Debbe Odea, MD  NUTRITIONAL SUPPLEMENT LIQD Take 120 mLs by mouth 2 (two) times daily. NSA MedPass    [provider]  pantoprazole (PROTONIX) 40 MG tablet Take 40 mg by mouth daily.    [provider]  polyethylene glycol (MIRALAX / GLYCOLAX) packet Take 17 g by mouth daily as needed for mild constipation.     [provider]  predniSONE (DELTASONE) 5 MG tablet 1 tablet on odd days, one half tablet on even days 11/22/14   Kathrynn Ducking, MD  senna-docusate (SENOKOT-S) 8.6-50 MG tablet Take 2 tablets by mouth at  bedtime.    [provider]  Skin Protectants, Misc. (EUCERIN) cream Apply 1 application topically 2 (two) times daily.    [provider]  tolterodine (DETROL LA) 4 MG 24 hr capsule Take 4 mg by mouth daily.     [provider]    Family History Family History  Problem Relation Age of Onset  . Multiple sclerosis Other        neices.   . Cancer Mother   . Diabetes Mother   . GI Bleed Sister        diverticulitis    Social History Social History   Tobacco Use  . Smoking status: Never Smoker  . Smokeless tobacco: Never Used  Substance Use Topics  . Alcohol use: No  . Drug use: No     Allergies   Codeine; Ultram [tramadol]; and Januvia [sitagliptin]   Review of  Systems Review of Systems  All other systems reviewed and are negative.    Physical Exam Updated Vital Signs BP (!) 161/66   Pulse 97   Temp 98.1 F (36.7 C) (Oral)   Resp 18   Ht 5\' 2"  (1.575 m)   Wt 85.7 kg (189 lb)   SpO2 97%   BMI 34.57 kg/m   Physical Exam  Constitutional: She appears well-developed and well-nourished.  HENT:  Head: Normocephalic and atraumatic.  Right Ear: External ear normal.  Left Ear: External ear normal.  Nose: Nose normal.  Mouth/Throat: Uvula is midline, oropharynx is clear and moist and mucous membranes are normal. No tonsillar exudate.  Eyes: Pupils are equal, round, and reactive to light. Right eye exhibits no discharge. Left eye exhibits no discharge. No scleral icterus.  Neck: Trachea normal. Neck supple. No spinous process tenderness present. No neck rigidity. Normal range of motion present.  Cardiovascular: Normal rate, regular rhythm and intact distal pulses.  No murmur heard. Pulses:      Radial pulses are 2+ on the right side, and 2+ on the left side.       Dorsalis pedis pulses are 2+ on the right side, and 2+ on the left side.       Posterior tibial pulses are 2+ on the right side, and 2+ on the left side.  No lower extremity  swelling or edema. Calves symmetric in size bilaterally.  Pulmonary/Chest: Effort normal and breath sounds normal. She exhibits no tenderness.  Abdominal: Soft. Bowel sounds are normal. There is no tenderness. There is no rigidity, no rebound, no guarding and no CVA tenderness.  Suprapubic catheter in place.  Balloon appears to be debilitated and unable to remove as I meet resistance.  Area does appear to be unsecured and loose.  Locking sutures are in place but not attached to skin.  Musculoskeletal: She exhibits no edema.  Lymphadenopathy:    She has no cervical adenopathy.  Neurological: She is alert.  Skin: Skin is warm and dry. No rash noted. She is not diaphoretic.  Psychiatric: She has a normal mood and affect.  Nursing note and vitals reviewed.    ED Treatments / Results  Labs (all labs ordered are listed, but only abnormal results are displayed) Labs Reviewed  URINALYSIS, ROUTINE W REFLEX MICROSCOPIC - Abnormal; Notable for the following components:      Result Value   APPearance CLOUDY (*)    Hgb urine dipstick LARGE (*)    Protein, ur 100 (*)    Nitrite POSITIVE (*)    Leukocytes, UA LARGE (*)    RBC / HPF >50 (*)    WBC, UA >50 (*)    Bacteria, UA MANY (*)    All other components within normal limits  URINE CULTURE    EKG None  Radiology No results found.  Procedures Procedures (including critical care time)  Medications Ordered in ED Medications  cefTRIAXone (ROCEPHIN) injection 1 g (has no administration in time range)     Initial Impression / Assessment and Plan / ED Course  I have reviewed the triage vital signs and the nursing notes.  Pertinent labs & imaging results that were available during my care of the patient were reviewed by me and considered in my medical decision making (see chart for details).     77 y.o. female with leakage around suprapubic cathether.  No nausea or vomiting, fever, or abdominal pain at home.  Patient's vital signs  are reassuring on presentation.  No evidence of surrounding infection around catheter.  Suprapubic catheter did appear to be loose and was secured by Tegaderm.  Her abdominal exam is benign without any tenderness palpation.  No peritoneal signs.  Patient's urine was suspicious for UTI with nitrite positive, leukocyte positive, and 50 white blood cells.  Urine culture sent.  She was given dose of IM Rocephin.  Patient recently finished Augmentin antibiotic.  Will switch to Bactrim. I advised the patient to follow-up with PCP this week. Specific return precautions discussed. Time was given for all questions to be answered. The patient verbalized understanding and agreement with plan. The patient appears safe for discharge home. Patient case seen and discussed with Dr. Sherry Ruffing who is in agreement with plan.   Final Clinical Impressions(s) / ED Diagnoses   Final diagnoses:  Acute cystitis with hematuria  Suprapubic catheter dysfunction, initial encounter [facility])    ED Discharge Orders        Ordered    sulfamethoxazole-trimethoprim (BACTRIM DS,SEPTRA DS) 800-160 MG tablet  2 times daily     11/09/17 0025       Jillyn Ledger, PA-C 11/09/17 0026    Tegeler, Gwenyth Allegra, MD 11/10/17 1149

## 2017-11-08 NOTE — ED Triage Notes (Signed)
Per EMS pt is from [place] with a history of suprapubic catheter, type 2 diabetes and MS for which she is wheelchair bound.  She come in today due to leaking and pain at the insertion site 3/10.  Her primary doctor ask for her to come here because he could not see her until Monday.  Pt is AOx4 NAD noted at this time.

## 2017-11-09 ENCOUNTER — Other Ambulatory Visit: Payer: Self-pay

## 2017-11-09 DIAGNOSIS — N3001 Acute cystitis with hematuria: Secondary | ICD-10-CM | POA: Diagnosis not present

## 2017-11-09 DIAGNOSIS — M2550 Pain in unspecified joint: Secondary | ICD-10-CM | POA: Diagnosis not present

## 2017-11-09 DIAGNOSIS — Z7401 Bed confinement status: Secondary | ICD-10-CM | POA: Diagnosis not present

## 2017-11-09 LAB — URINALYSIS, ROUTINE W REFLEX MICROSCOPIC
BILIRUBIN URINE: NEGATIVE
Glucose, UA: NEGATIVE mg/dL
KETONES UR: NEGATIVE mg/dL
Nitrite: POSITIVE — AB
PROTEIN: 100 mg/dL — AB
Specific Gravity, Urine: 1.011 (ref 1.005–1.030)
pH: 6 (ref 5.0–8.0)

## 2017-11-09 MED ORDER — SULFAMETHOXAZOLE-TRIMETHOPRIM 800-160 MG PO TABS: 1.0000 | ORAL_TABLET | Freq: Two times a day (BID) | ORAL | 0 refills | Status: DC

## 2017-11-09 MED ORDER — CEFTRIAXONE SODIUM 1 G IJ SOLR
1.0000 g | Freq: Once | INTRAMUSCULAR | Status: AC
Start: 2017-11-09 — End: 2017-11-09
  Administered 2017-11-09: 1 g via INTRAMUSCULAR
  Filled 2017-11-09: qty 10

## 2017-11-09 MED ORDER — LIDOCAINE HCL (PF) 1 % IJ SOLN
INTRAMUSCULAR | Status: AC
  Filled 2017-11-09: qty 5

## 2017-11-09 NOTE — Discharge Instructions (Signed)
You were seen here today for leakage around the catheter.  This was secured using Tegaderm.  Your urine is also suspicious for UTI.  Please see attached handout on UTI.  I am discharging you home on antibiotics for this. Please take all of your antibiotics until finished!   You may develop abdominal discomfort or diarrhea from the antibiotic.  You may help offset this with probiotics which you can buy or get in yogurt. Do not eat or take the probiotics until 2 hours after your antibiotic. Do not take your medicine if develop an itchy rash, swelling in your mouth or lips, or difficulty breathing.   Follow up with your PCP on Monday. If you develop worsening or new concerning symptoms you can return to the emergency department for re-evaluation.

## 2017-11-09 NOTE — ED Notes (Signed)
Called facility to give report [facility]

## 2017-11-09 NOTE — ED Notes (Signed)
[facility] contacted for tx to [facility] 

## 2017-11-10 ENCOUNTER — Encounter: Payer: Self-pay | Admitting: Endocrinology

## 2017-11-10 ENCOUNTER — Ambulatory Visit (INDEPENDENT_AMBULATORY_CARE_PROVIDER_SITE_OTHER): Payer: Medicare Other | Admitting: Endocrinology

## 2017-11-10 VITALS — BP 138/74 | HR 116

## 2017-11-10 DIAGNOSIS — R293 Abnormal posture: Secondary | ICD-10-CM | POA: Diagnosis not present

## 2017-11-10 DIAGNOSIS — G8220 Paraplegia, unspecified: Secondary | ICD-10-CM | POA: Diagnosis not present

## 2017-11-10 DIAGNOSIS — R7989 Other specified abnormal findings of blood chemistry: Secondary | ICD-10-CM

## 2017-11-10 DIAGNOSIS — R489 Unspecified symbolic dysfunctions: Secondary | ICD-10-CM | POA: Diagnosis not present

## 2017-11-10 DIAGNOSIS — M6281 Muscle weakness (generalized): Secondary | ICD-10-CM | POA: Diagnosis not present

## 2017-11-10 DIAGNOSIS — G35 Multiple sclerosis: Secondary | ICD-10-CM | POA: Diagnosis not present

## 2017-11-10 LAB — URINE CULTURE

## 2017-11-10 MED ORDER — METHIMAZOLE 5 MG PO TABS: 5.0000 mg | ORAL_TABLET | Freq: Every day | ORAL | 5 refills | Status: DC

## 2017-11-10 NOTE — Patient Instructions (Signed)
Please start methimazole 5 mg PO qd If ever resident has fever while taking methimazole, stop it and check WBC, even if the reason is obvious, because of the risk of a rare side-effect.  Call us if WBC are low.  If WBC are normal or high, please resume methimazole.   Please recheck TSH and Free T4 in 1 month.  Please forward results. Please come back for a follow-up appointment in 2 months.

## 2017-11-10 NOTE — Progress Notes (Signed)
Subjective:    Patient ID: Laura Roberts, female    DOB: 25-Mar-1941, 77 y.o.   MRN: [id number]  HPI Pt is referred by Dr Linna Darner, for hyperthyroidism.  Pt reports she was dx'ed with hyperthyroidism in 2014.  she has never been on therapy for this.  she has never had XRT to the anterior neck, or thyroid surgery.  she does not consume kelp or any other non-prescribed thyroid medication.  she has never been on amiodarone.  She lives at [place], so she cannot be isolated for RAI.  She has severe weakness of the LE's, and assoc numbness.  Past Medical History:  Diagnosis Date  . Degenerative arthritis   . Diabetes mellitus without complication ([facility])   . Gait disturbance   . Hypertension   . Multiple sclerosis ([place])   . Neuromuscular disorder ([facility])    Bilateral hand carpal tunnel syndrome  . Obesity   . Spinal stenosis in cervical region   . Spinal stenosis of lumbosacral region   . Spinal stenosis, thoracic     Past Surgical History:  Procedure Laterality Date  . ABDOMINAL HYSTERECTOMY    . BACK SURGERY    . CATARACT EXTRACTION Right   . CHOLECYSTECTOMY    . FEMUR IM NAIL Right 06/02/2014   Procedure: INTRAMEDULLARY (IM) RETROGRADE FEMORAL NAILING;  Surgeon: Johnny Bridge, MD;  Location: [facility];  Service: Orthopedics;  Laterality: Right;  . GROIN DISSECTION Left 10/24/2017   Procedure: IRRIGATION AND DEBRIDEMENT, LEFT THIGH ABCESS ;  Surgeon: Alphonsa Overall, MD;  Location: [facility];  Service: General;  Laterality: Left;  . IR CATHETER TUBE CHANGE  10/13/2017  . left hand carpal tunnel surgery    . REPLACEMENT TOTAL KNEE BILATERAL  2004    Social History   Socioeconomic History  . Marital status: Widowed    Spouse name: Not on file  . Number of children: 2  . Years of education: 39yr colleg  . Highest education level: Not on file  Occupational History  . Occupation: N/A    Employer: [employer]    Comment: [employer]  Social Needs  . Financial resource strain:  Not on file  . Food insecurity:    Worry: Not on file    Inability: Not on file  . Transportation needs:    Medical: Not on file    Non-medical: Not on file  Tobacco Use  . Smoking status: Never Smoker  . Smokeless tobacco: Never Used  Substance and Sexual Activity  . Alcohol use: No  . Drug use: No  . Sexual activity: Never  Lifestyle  . Physical activity:    Days per week: Not on file    Minutes per session: Not on file  . Stress: Not on file  Relationships  . Social connections:    Talks on phone: Not on file    Gets together: Not on file    Attends religious service: Not on file    Active member of club or organization: Not on file    Attends meetings of clubs or organizations: Not on file    Relationship status: Not on file  . Intimate partner violence:    Fear of current or ex partner: Not on file    Emotionally abused: Not on file    Physically abused: Not on file    Forced sexual activity: Not on file  Other Topics Concern  . Not on file  Social History Narrative   Patient is  widowed with 2 children   Patient is right handed   Patient has 2 yrs of college   Patient drinks 2-3 cups of tea daily    Current Outpatient Medications on File Prior to Visit  Medication Sig Dispense Refill  . acetaminophen (TYLENOL) 325 MG tablet Take 650 mg by mouth every 6 (six) hours as needed.     . Amino Acids-Protein Hydrolys (FEEDING SUPPLEMENT, PRO-STAT SUGAR FREE 64,) LIQD Take 30 mLs by mouth 2 (two) times daily. 900 mL 0  . amoxicillin-clavulanate (AUGMENTIN) 875-125 MG tablet Take 1 tablet by mouth every 12 (twelve) hours.    . ARTIFICIAL TEAR OP Apply 1 drop to eye 4 (four) times daily as needed (dry eyes).     . baclofen (LIORESAL) 10 MG tablet 1.5 tablet in the morning, 1 tablet midday, two tablets at night 135 tablet 5  . bisacodyl (DULCOLAX) 10 MG suppository Place 10 mg rectally daily as needed for moderate constipation.    . calcium citrate (CALCITRATE - DOSED IN MG  ELEMENTAL CALCIUM) 950 MG tablet Take 200 mg of elemental calcium by mouth daily.    . cholecalciferol (VITAMIN D) 1000 UNITS tablet Take 1,000 Units by mouth daily.    . collagenase (SANTYL) ointment Apply topically daily. 15 g 0  . Dimethyl Fumarate (TECFIDERA) 240 MG CPDR Take 1 capsule (240 mg total) by mouth 2 (two) times daily. 60 capsule 11  . DULoxetine (CYMBALTA) 30 MG capsule Take 90 mg by mouth daily.     Marland Kitchen gabapentin (NEURONTIN) 300 MG capsule Take 1 capsule (300 mg total) by mouth 2 (two) times daily. 60 capsule 0  . Insulin Aspart (NOVOLOG FLEXPEN [place]) Inject 2-15 Units into the skin 3 (three) times daily before meals. SSI:  121-150 = 2 units, 151-300 = 3 units, 201-250 = 5 units, 251-300 = 8 units, 301-350 = 11 units, 351-400 = 15 units, greater than 400 notify provider    . insulin glargine (LANTUS) 100 UNIT/ML injection Inject 0.2 mLs (20 Units total) into the skin at bedtime. 10 mL 11  . magnesium hydroxide (MILK OF MAGNESIA) 400 MG/5ML suspension Take 30 mLs by mouth daily as needed for mild constipation.    . nutrition supplement, JUVEN, (JUVEN) PACK Take 1 packet by mouth daily.  0  . NUTRITIONAL SUPPLEMENT LIQD Take 120 mLs by mouth 2 (two) times daily. NSA MedPass    . pantoprazole (PROTONIX) 40 MG tablet Take 40 mg by mouth daily.    . polyethylene glycol (MIRALAX / GLYCOLAX) packet Take 17 g by mouth daily as needed for mild constipation.     . predniSONE (DELTASONE) 5 MG tablet 1 tablet on odd days, one half tablet on even days    . senna-docusate (SENOKOT-S) 8.6-50 MG tablet Take 2 tablets by mouth at bedtime.    . Skin Protectants, Misc. (EUCERIN) cream Apply 1 application topically 2 (two) times daily.    Marland Kitchen sulfamethoxazole-trimethoprim (BACTRIM DS,SEPTRA DS) 800-160 MG tablet Take 1 tablet by mouth 2 (two) times daily. 10 tablet 0  . tolterodine (DETROL LA) 4 MG 24 hr capsule Take 4 mg by mouth daily.      No current facility-administered medications on file prior to  visit.     Allergies  Allergen Reactions  . Codeine Other (See Comments)    Difficult breathing and skin problem  . Ultram [Tramadol] Other (See Comments)    Difficult breathing and skin peeling  . Januvia [Sitagliptin] Rash    Blisters  Family History  Problem Relation Age of Onset  . Multiple sclerosis Other        neices.   . Cancer Mother   . Diabetes Mother   . GI Bleed Sister        diverticulitis  . Thyroid disease Neg Hx     BP 138/74   Pulse (!) 116   SpO2 97%    Review of Systems denies headache, hoarseness, visual loss, palpitations, sob, tremor, heat intolerance, easy bruising, and rhinorrhea. She has lost 15 lbs over the past year.  She has intermitt diarrhea, night sweats, anxiety, and LE edema.     Objective:   Physical Exam VS: see vs page GEN: no distress.  In wheelchair. Has indwelling urinary catheter.   HEAD: head: no deformity eyes: no periorbital swelling, no proptosis.   external nose and ears are normal mouth: no lesion seen NECK: supple, thyroid is not enlarged CHEST WALL: no deformity LUNGS: clear to auscultation CV: tachycardic rate, but reg rhythm, no murmur ABD: abdomen is soft, nontender.  no hepatosplenomegaly.  not distended.  no hernia.  MUSCULOSKELETAL: muscle bulk and strength are grossly normal on the UE's, and severely decreased throughout the LE's.  no obvious joint swelling.   EXTEMITIES: no deformity.  Trace bilat leg edema PULSES: no carotid bruit NEURO:  cn 2-12 grossly intact.   readily moves all 4's.  sensation is decreased to touch on all 4's.  SKIN:  Normal texture and temperature.  No rash or suspicious lesion is visible.   NODES:  None palpable at the neck PSYCH: alert, well-oriented.  Does not appear anxious nor depressed.  [place] (2012): multiple nodules scattered throughout the right lobe of no more than 13 mm in diameter  I have reviewed outside records, and summarized: Pt was noted to have suppressed TSH, and  referred here.  Other med probs were addressed: DM, painful polyneuropathy, urinary retention, depression, wellness, and MS.      Assessment & Plan:  Multinodular goiter, which is usually hereditary Hyperthyroidism, due to the goiter: new to me MS: with normalization of TFT, she may notice some improvement.  Patient Instructions  Please start methimazole 5 mg PO qd If ever resident has fever while taking methimazole, stop it and check WBC, even if the reason is obvious, because of the risk of a rare side-effect.  Call us if WBC are low.  If WBC are normal or high, please resume methimazole.   Please recheck TSH and Free T4 in 1 month.  Please forward results. Please come back for a follow-up appointment in 2 months.

## 2017-11-11 ENCOUNTER — Other Ambulatory Visit: Payer: Self-pay

## 2017-11-11 DIAGNOSIS — G35 Multiple sclerosis: Secondary | ICD-10-CM | POA: Diagnosis not present

## 2017-11-11 DIAGNOSIS — R293 Abnormal posture: Secondary | ICD-10-CM | POA: Diagnosis not present

## 2017-11-11 DIAGNOSIS — M6281 Muscle weakness (generalized): Secondary | ICD-10-CM | POA: Diagnosis not present

## 2017-11-11 DIAGNOSIS — R489 Unspecified symbolic dysfunctions: Secondary | ICD-10-CM | POA: Diagnosis not present

## 2017-11-11 DIAGNOSIS — G8220 Paraplegia, unspecified: Secondary | ICD-10-CM | POA: Diagnosis not present

## 2017-11-11 MED ORDER — METHIMAZOLE 5 MG PO TABS: 5.0000 mg | ORAL_TABLET | Freq: Every day | ORAL | 5 refills | Status: DC

## 2017-11-12 DIAGNOSIS — G35 Multiple sclerosis: Secondary | ICD-10-CM | POA: Diagnosis not present

## 2017-11-12 DIAGNOSIS — R489 Unspecified symbolic dysfunctions: Secondary | ICD-10-CM | POA: Diagnosis not present

## 2017-11-12 DIAGNOSIS — G8220 Paraplegia, unspecified: Secondary | ICD-10-CM | POA: Diagnosis not present

## 2017-11-12 DIAGNOSIS — R293 Abnormal posture: Secondary | ICD-10-CM | POA: Diagnosis not present

## 2017-11-12 DIAGNOSIS — M6281 Muscle weakness (generalized): Secondary | ICD-10-CM | POA: Diagnosis not present

## 2017-11-13 ENCOUNTER — Non-Acute Institutional Stay (SKILLED_NURSING_FACILITY): Payer: Medicare Other

## 2017-11-13 DIAGNOSIS — Z0000 Encounter for general adult medical examination without abnormal findings: Secondary | ICD-10-CM

## 2017-11-13 DIAGNOSIS — G8220 Paraplegia, unspecified: Secondary | ICD-10-CM | POA: Diagnosis not present

## 2017-11-13 DIAGNOSIS — R489 Unspecified symbolic dysfunctions: Secondary | ICD-10-CM | POA: Diagnosis not present

## 2017-11-13 DIAGNOSIS — G35 Multiple sclerosis: Secondary | ICD-10-CM | POA: Diagnosis not present

## 2017-11-13 DIAGNOSIS — M6281 Muscle weakness (generalized): Secondary | ICD-10-CM | POA: Diagnosis not present

## 2017-11-13 DIAGNOSIS — R293 Abnormal posture: Secondary | ICD-10-CM | POA: Diagnosis not present

## 2017-11-13 DIAGNOSIS — Z23 Encounter for immunization: Secondary | ICD-10-CM | POA: Diagnosis not present

## 2017-11-13 NOTE — Patient Instructions (Addendum)
[name] , Thank you for taking time to come for your Medicare Wellness Visit. I appreciate your ongoing commitment to your health goals. Please review the following plan we discussed and let me know if I can assist you in the future.   Screening recommendations/referrals: Colonoscopy excluded Mammogram excluded Bone Density excluded Recommended yearly ophthalmology/optometry visit for glaucoma screening and checkup Recommended yearly dental visit for hygiene and checkup  Vaccinations: Influenza vaccine due 2019 fall season Pneumococcal vaccine 13 due, ordered Tdap vaccine due, ordered Shingles vaccine not in past records    Advanced directives: Need a copy of living will and health care power of attorney in chart. DNR in chart  Conditions/risks identified: none  Next appointment: Dr. Linna Darner makes rounds   Preventive Care 1 Years and Older, Female Preventive care refers to lifestyle choices and visits with your health care provider that can promote health and wellness. What does preventive care include?  A yearly physical exam. This is also called an annual well check.  Dental exams once or twice a year.  Routine eye exams. Ask your health care provider how often you should have your eyes checked.  Personal lifestyle choices, including:  Daily care of your teeth and gums.  Regular physical activity.  Eating a healthy diet.  Avoiding tobacco and drug use.  Limiting alcohol use.  Practicing safe sex.  Taking low-dose aspirin every day.  Taking vitamin and mineral supplements as recommended by your health care provider. What happens during an annual well check? The services and screenings done by your health care provider during your annual well check will depend on your age, overall health, lifestyle risk factors, and family history of disease. Counseling  Your health care provider may ask you questions about your:  Alcohol use.  Tobacco use.  Drug  use.  Emotional well-being.  Home and relationship well-being.  Sexual activity.  Eating habits.  History of falls.  Memory and ability to understand (cognition).  Work and work Statistician.  Reproductive health. Screening  You may have the following tests or measurements:  Height, weight, and BMI.  Blood pressure.  Lipid and cholesterol levels. These may be checked every 5 years, or more frequently if you are over 62 years old.  Skin check.  Lung cancer screening. You may have this screening every year starting at age 68 if you have a 30-pack-year history of smoking and currently smoke or have quit within the past 15 years.  Fecal occult blood test (FOBT) of the stool. You may have this test every year starting at age 54.  Flexible sigmoidoscopy or colonoscopy. You may have a sigmoidoscopy every 5 years or a colonoscopy every 10 years starting at age 47.  Hepatitis C blood test.  Hepatitis B blood test.  Sexually transmitted disease (STD) testing.  Diabetes screening. This is done by checking your blood sugar (glucose) after you have not eaten for a while (fasting). You may have this done every 1-3 years.  Bone density scan. This is done to screen for osteoporosis. You may have this done starting at age 27.  Mammogram. This may be done every 1-2 years. Talk to your health care provider about how often you should have regular mammograms. Talk with your health care provider about your test results, treatment options, and if necessary, the need for more tests. Vaccines  Your health care provider may recommend certain vaccines, such as:  Influenza vaccine. This is recommended every year.  Tetanus, diphtheria, and acellular pertussis (Tdap,  Td) vaccine. You may need a Td booster every 10 years.  Zoster vaccine. You may need this after age 50.  Pneumococcal 13-valent conjugate (PCV13) vaccine. One dose is recommended after age 66.  Pneumococcal polysaccharide  (PPSV23) vaccine. One dose is recommended after age 37. Talk to your health care provider about which screenings and vaccines you need and how often you need them. This information is not intended to replace advice given to you by your health care provider. Make sure you discuss any questions you have with your health care provider. Document Released: 07/14/2015 Document Revised: 03/06/2016 Document Reviewed: 04/18/2015 Elsevier Interactive Patient Education  2017 [place] Prevention in the Home Falls can cause injuries. They can happen to people of all ages. There are many things you can do to make your home safe and to help prevent falls. What can I do on the outside of my home?  Regularly fix the edges of walkways and driveways and fix any cracks.  Remove anything that might make you trip as you walk through a door, such as a raised step or threshold.  Trim any bushes or trees on the path to your home.  Use bright outdoor lighting.  Clear any walking paths of anything that might make someone trip, such as rocks or tools.  Regularly check to see if handrails are loose or broken. Make sure that both sides of any steps have handrails.  Any raised decks and porches should have guardrails on the edges.  Have any leaves, snow, or ice cleared regularly.  Use sand or salt on walking paths during winter.  Clean up any spills in your garage right away. This includes oil or grease spills. What can I do in the bathroom?  Use night lights.  Install grab bars by the toilet and in the tub and shower. Do not use towel bars as grab bars.  Use non-skid mats or decals in the tub or shower.  If you need to sit down in the shower, use a plastic, non-slip stool.  Keep the floor dry. Clean up any water that spills on the floor as soon as it happens.  Remove soap buildup in the tub or shower regularly.  Attach bath mats securely with double-sided non-slip rug tape.  Do not have  throw rugs and other things on the floor that can make you trip. What can I do in the bedroom?  Use night lights.  Make sure that you have a light by your bed that is easy to reach.  Do not use any sheets or blankets that are too big for your bed. They should not hang down onto the floor.  Have a firm chair that has side arms. You can use this for support while you get dressed.  Do not have throw rugs and other things on the floor that can make you trip. What can I do in the kitchen?  Clean up any spills right away.  Avoid walking on wet floors.  Keep items that you use a lot in easy-to-reach places.  If you need to reach something above you, use a strong step stool that has a grab bar.  Keep electrical cords out of the way.  Do not use floor polish or wax that makes floors slippery. If you must use wax, use non-skid floor wax.  Do not have throw rugs and other things on the floor that can make you trip. What can I do with my stairs?  Do not  leave any items on the stairs.  Make sure that there are handrails on both sides of the stairs and use them. Fix handrails that are broken or loose. Make sure that handrails are as long as the stairways.  Check any carpeting to make sure that it is firmly attached to the stairs. Fix any carpet that is loose or worn.  Avoid having throw rugs at the top or bottom of the stairs. If you do have throw rugs, attach them to the floor with carpet tape.  Make sure that you have a light switch at the top of the stairs and the bottom of the stairs. If you do not have them, ask someone to add them for you. What else can I do to help prevent falls?  Wear shoes that:  Do not have high heels.  Have rubber bottoms.  Are comfortable and fit you well.  Are closed at the toe. Do not wear sandals.  If you use a stepladder:  Make sure that it is fully opened. Do not climb a closed stepladder.  Make sure that both sides of the stepladder are  locked into place.  Ask someone to hold it for you, if possible.  Clearly mark and make sure that you can see:  Any grab bars or handrails.  First and last steps.  Where the edge of each step is.  Use tools that help you move around (mobility aids) if they are needed. These include:  Canes.  Walkers.  Scooters.  Crutches.  Turn on the lights when you go into a dark area. Replace any light bulbs as soon as they burn out.  Set up your furniture so you have a clear path. Avoid moving your furniture around.  If any of your floors are uneven, fix them.  If there are any pets around you, be aware of where they are.  Review your medicines with your doctor. Some medicines can make you feel dizzy. This can increase your chance of falling. Ask your doctor what other things that you can do to help prevent falls. This information is not intended to replace advice given to you by your health care provider. Make sure you discuss any questions you have with your health care provider. Document Released: 04/13/2009 Document Revised: 11/23/2015 Document Reviewed: 07/22/2014 Elsevier Interactive Patient Education  2017 [employer].

## 2017-11-13 NOTE — Progress Notes (Addendum)
Subjective:   Laura Roberts is a 76 y.o. female who presents for Medicare Annual (Subsequent) preventive examination at [facility] term snf  Last AWV-09/11/2011    Objective:     Vitals: BP (!) 155/72 (BP Location: Left Arm, Patient Position: Sitting) Comment: Has not had BP meds yet today  Pulse 97   Temp 98 F (36.7 C) (Oral)   Ht 5\' 2"  (1.575 m)   Wt 189 lb (85.7 kg)   SpO2 98%   BMI 34.57 kg/m   Body mass index is 34.57 kg/m.  Advanced Directives 11/13/2017 11/09/2017 10/30/2017 10/24/2017 10/24/2017 10/24/2017 10/10/2017  Does Patient Have a Medical Advance Directive? Yes Yes Yes - Yes Yes Yes  Type of Advance Directive Out of facility DNR (pink MOST or yellow form) Out of facility DNR (pink MOST or yellow form) Out of facility DNR (pink MOST or yellow form) Out of facility DNR (pink MOST or yellow form) Out of facility DNR (pink MOST or yellow form) Out of facility DNR (pink MOST or yellow form);Healthcare Power of [place];Living will  Does patient want to make changes to medical advance directive? No - Patient declined No - Patient declined No - Patient declined No - Patient declined - - No - Patient declined  Copy of [place] in Chart? No - copy requested - No - copy requested - - - No - copy requested  Would patient like information on creating a medical advance directive? - - - - - - -  Pre-existing out of facility DNR order (yellow form or pink MOST form) Yellow form placed in chart (order not valid for inpatient use) - - - - Yellow form placed in chart (order not valid for inpatient use) -    Tobacco Social History   Tobacco Use  Smoking Status Never Smoker  Smokeless Tobacco Never Used     Counseling given: Not Answered   Clinical Intake:  Pre-visit preparation completed: No  Pain : No/denies pain     Nutritional Risks: None Diabetes: Yes CBG done?: No Did pt. bring in CBG monitor from home?: No  How  often do you need to have someone help you when you read instructions, pamphlets, or other written materials from your doctor or pharmacy?: 2 - Rarely  Interpreter Needed?: No  Information entered by :: Tyson Dense, RN  Past Medical History:  Diagnosis Date  . Degenerative arthritis   . Diabetes mellitus without complication ([place])   . Gait disturbance   . Hypertension   . Multiple sclerosis ([place])   . Neuromuscular disorder (HCC)    Bilateral hand carpal tunnel syndrome  . Obesity   . Spinal stenosis in cervical region   . Spinal stenosis of lumbosacral region   . Spinal stenosis, thoracic    Past Surgical History:  Procedure Laterality Date  . ABDOMINAL HYSTERECTOMY    . BACK SURGERY    . CATARACT EXTRACTION Right   . CHOLECYSTECTOMY    . FEMUR IM NAIL Right 06/02/2014   Procedure: INTRAMEDULLARY (IM) RETROGRADE FEMORAL NAILING;  Surgeon: Johnny Bridge, MD;  Location: [place];  Service: Orthopedics;  Laterality: Right;  . GROIN DISSECTION Left 10/24/2017   Procedure: IRRIGATION AND DEBRIDEMENT, LEFT THIGH ABCESS ;  Surgeon: Alphonsa Overall, MD;  Location: [facility];  Service: General;  Laterality: Left;  . IR CATHETER TUBE CHANGE  10/13/2017  . left hand carpal tunnel surgery    . REPLACEMENT TOTAL KNEE BILATERAL  2004   Family History  Problem Relation Age of Onset  . Multiple sclerosis Other        neices.   . Cancer Mother   . Diabetes Mother   . GI Bleed Sister        diverticulitis  . Thyroid disease Neg Hx    Social History   Socioeconomic History  . Marital status: Widowed    Spouse name: Not on file  . Number of children: 2  . Years of education: 55yr colleg  . Highest education level: Not on file  Occupational History  . Occupation: N/A    Employer: [employer]    Comment: NIGHT RESIDENT [employer]  Social Needs  . Financial resource strain: Not hard at all  . Food insecurity:    Worry: Never true    Inability: Never true  . Transportation needs:     Medical: No    Non-medical: No  Tobacco Use  . Smoking status: Never Smoker  . Smokeless tobacco: Never Used  Substance and Sexual Activity  . Alcohol use: No  . Drug use: No  . Sexual activity: Never  Lifestyle  . Physical activity:    Days per week: 7 days    Minutes per session: 30 min  . Stress: Rather much  Relationships  . Social connections:    Talks on phone: More than three times a week    Gets together: More than three times a week    Attends religious service: Never    Active member of club or organization: No    Attends meetings of clubs or organizations: Never    Relationship status: Widowed  Other Topics Concern  . Not on file  Social History Narrative   Patient is widowed with 2 children   Patient is right handed   Patient has 2 yrs of college   Patient drinks 2-3 cups of tea daily    Outpatient Encounter Medications as of 11/13/2017  Medication Sig  . acetaminophen (TYLENOL) 325 MG tablet Take 650 mg by mouth every 6 (six) hours as needed.   . Amino Acids-Protein Hydrolys (FEEDING SUPPLEMENT, PRO-STAT SUGAR FREE 64,) LIQD Take 30 mLs by mouth 2 (two) times daily.  Marland Kitchen amoxicillin-clavulanate (AUGMENTIN) 875-125 MG tablet Take 1 tablet by mouth every 12 (twelve) hours.  . ARTIFICIAL TEAR OP Apply 1 drop to eye 4 (four) times daily as needed (dry eyes).   . baclofen (LIORESAL) 10 MG tablet 1.5 tablet in the morning, 1 tablet midday, two tablets at night  . bisacodyl (DULCOLAX) 10 MG suppository Place 10 mg rectally daily as needed for moderate constipation.  . calcium citrate (CALCITRATE - DOSED IN MG ELEMENTAL CALCIUM) 950 MG tablet Take 200 mg of elemental calcium by mouth daily.  . cholecalciferol (VITAMIN D) 1000 UNITS tablet Take 1,000 Units by mouth daily.  . collagenase (SANTYL) ointment Apply topically daily.  . Dimethyl Fumarate (TECFIDERA) 240 MG CPDR Take 1 capsule (240 mg total) by mouth 2 (two) times daily.  . DULoxetine (CYMBALTA) 30 MG capsule Take  90 mg by mouth daily.   Marland Kitchen gabapentin (NEURONTIN) 300 MG capsule Take 1 capsule (300 mg total) by mouth 2 (two) times daily.  . Insulin Aspart (NOVOLOG FLEXPEN [place]) Inject 2-15 Units into the skin 3 (three) times daily before meals. SSI:  121-150 = 2 units, 151-300 = 3 units, 201-250 = 5 units, 251-300 = 8 units, 301-350 = 11 units, 351-400 = 15 units, greater than 400 notify  provider  . insulin glargine (LANTUS) 100 UNIT/ML injection Inject 0.2 mLs (20 Units total) into the skin at bedtime.  . magnesium hydroxide (MILK OF MAGNESIA) 400 MG/5ML suspension Take 30 mLs by mouth daily as needed for mild constipation.  . methimazole (TAPAZOLE) 5 MG tablet Take 1 tablet (5 mg total) by mouth daily.  . nutrition supplement, JUVEN, (JUVEN) PACK Take 1 packet by mouth daily.  Marland Kitchen NUTRITIONAL SUPPLEMENT LIQD Take 120 mLs by mouth 2 (two) times daily. NSA MedPass  . pantoprazole (PROTONIX) 40 MG tablet Take 40 mg by mouth daily.  . polyethylene glycol (MIRALAX / GLYCOLAX) packet Take 17 g by mouth daily as needed for mild constipation.   . predniSONE (DELTASONE) 5 MG tablet 1 tablet on odd days, one half tablet on even days  . senna-docusate (SENOKOT-S) 8.6-50 MG tablet Take 2 tablets by mouth at bedtime.  . Skin Protectants, Misc. (EUCERIN) cream Apply 1 application topically 2 (two) times daily.  Marland Kitchen sulfamethoxazole-trimethoprim (BACTRIM DS,SEPTRA DS) 800-160 MG tablet Take 1 tablet by mouth 2 (two) times daily.  Marland Kitchen tolterodine (DETROL LA) 4 MG 24 hr capsule Take 4 mg by mouth daily.    No facility-administered encounter medications on file as of 11/13/2017.     Activities of Daily Living In your present state of health, do you have any difficulty performing the following activities: 11/13/2017 10/24/2017  Hearing? N N  Vision? N N  Difficulty concentrating or making decisions? N N  Walking or climbing stairs? Y Y  Comment paraplegic paralysis  Dressing or bathing? Y Y  Comment - -  Doing errands,  shopping? [place] Donning  Preparing Food and eating ? N -  Using the Toilet? N -  In the past six months, have you accidently leaked urine? Y -  Comment suprapubic catheter -  Do you have problems with loss of bowel control? N -  Managing your Medications? Y -  Managing your Finances? Y -  Housekeeping or managing your Housekeeping? Y -  Some recent data might be hidden    Patient Care Team: [name], MD as PCP - General (Internal Medicine) Medina-Vargas, Senaida Lange, NP as Nurse Practitioner (Internal Medicine)    Assessment:   This is a routine wellness examination for [place].  Exercise Activities and Dietary recommendations Current Exercise Habits: Structured exercise class, Type of exercise: Other - see comments(physical therapy), Time (Minutes): 30, Frequency (Times/Week): 7, Weekly Exercise (Minutes/Week): 210, Intensity: Mild, Exercise limited by: orthopedic condition(s)  Goals    None      Fall Risk Fall Risk  11/13/2017 10/14/2017  Falls in the past year? No No   Is the patient's home free of loose throw rugs in walkways, pet beds, electrical cords, etc?   yes      Grab bars in the bathroom? yes      Handrails on the stairs?   yes      Adequate lighting?   yes  Depression Screen PHQ 2/9 Scores 11/13/2017 10/14/2017  PHQ - 2 Score 3 0  PHQ- 9 Score 4 -     Cognitive Function     6CIT Screen 11/13/2017  What Year? 0 points  What month? 0 points  What time? 0 points  Count back from 20 0 points  Months in reverse 4 points  Repeat phrase 2 points  Total Score 6     There is no immunization history on file for this patient.  Qualifies for Shingles Vaccine? Not in  past records  Screening Tests Health Maintenance  Topic Date Due  . FOOT EXAM  11/05/1950  . OPHTHALMOLOGY EXAM  11[birth year]05/1950  . URINE MICROALBUMIN  11[birth year]05/1952  . COLONOSCOPY  11/05/1992  . DEXA SCAN  11/06/2007  . PNA vac Low Risk Adult (1 of 2 - PCV13) 11/06/2007  . TETANUS/TDAP  10/31/2018  (Originally 11/05/1961)  . INFLUENZA VACCINE  01/29/2018  . HEMOGLOBIN A1C  04/26/2018    Cancer Screenings: Lung: Low Dose CT Chest recommended if Age 25-80 years, 30 pack-year currently smoking OR have quit w/in 15years. Patient does not qualify. Breast:  Up to date on Mammogram? No, excluded due to MS and paraplegic Up to date of Bone Density/Dexa? No, excluded due to MS and paraplegic Colorectal: due, excluded due to MS and paraplegic  Additional Screenings:  Hepatitis C Screening: declined TDAP due-ordered Prevnar due-ordered Diabetic eye exam scheduled for later this month     Plan:    I have personally reviewed and addressed the Medicare Annual Wellness questionnaire and have noted the following in the patient's chart:  A. Medical and social history B. Use of alcohol, tobacco or illicit drugs  C. Current medications and supplements D. Functional ability and status E.  Nutritional status F.  Physical activity G. Advance directives H. List of other physicians I.  Hospitalizations, surgeries, and ER visits in previous 12 months J.  [place] to include hearing, vision, cognitive, depression L. Referrals and appointments - none  In addition, I have reviewed and discussed with patient certain preventive protocols, quality metrics, and best practice recommendations. A written personalized care plan for preventive services as well as general preventive health recommendations were provided to patient.  See attached scanned questionnaire for additional information.   Signed,   Tyson Dense, RN Nurse Health Advisor  Patient Concerns: None I have personally reviewed the health advisor's clinical note, was available for consultation, and agree with the assessment and plan as written. [name] M.D., FACP, [facility]

## 2017-11-17 DIAGNOSIS — N312 Flaccid neuropathic bladder, not elsewhere classified: Secondary | ICD-10-CM | POA: Diagnosis not present

## 2017-11-26 DIAGNOSIS — H52203 Unspecified astigmatism, bilateral: Secondary | ICD-10-CM | POA: Diagnosis not present

## 2017-11-26 DIAGNOSIS — E119 Type 2 diabetes mellitus without complications: Secondary | ICD-10-CM | POA: Diagnosis not present

## 2017-11-26 DIAGNOSIS — Z961 Presence of intraocular lens: Secondary | ICD-10-CM | POA: Diagnosis not present

## 2017-11-26 DIAGNOSIS — H04123 Dry eye syndrome of bilateral lacrimal glands: Secondary | ICD-10-CM | POA: Diagnosis not present

## 2017-12-01 DIAGNOSIS — L89214 Pressure ulcer of right hip, stage 4: Secondary | ICD-10-CM | POA: Diagnosis not present

## 2017-12-01 DIAGNOSIS — L89152 Pressure ulcer of sacral region, stage 2: Secondary | ICD-10-CM | POA: Diagnosis not present

## 2017-12-01 DIAGNOSIS — L89613 Pressure ulcer of right heel, stage 3: Secondary | ICD-10-CM | POA: Diagnosis not present

## 2017-12-03 DIAGNOSIS — N312 Flaccid neuropathic bladder, not elsewhere classified: Secondary | ICD-10-CM | POA: Diagnosis not present

## 2017-12-04 ENCOUNTER — Non-Acute Institutional Stay (SKILLED_NURSING_FACILITY): Payer: Medicare Other | Admitting: Adult Health

## 2017-12-04 ENCOUNTER — Encounter: Payer: Self-pay | Admitting: Adult Health

## 2017-12-04 DIAGNOSIS — F329 Major depressive disorder, single episode, unspecified: Secondary | ICD-10-CM

## 2017-12-04 DIAGNOSIS — Z794 Long term (current) use of insulin: Secondary | ICD-10-CM

## 2017-12-04 DIAGNOSIS — N319 Neuromuscular dysfunction of bladder, unspecified: Secondary | ICD-10-CM | POA: Diagnosis not present

## 2017-12-04 DIAGNOSIS — E0590 Thyrotoxicosis, unspecified without thyrotoxic crisis or storm: Secondary | ICD-10-CM

## 2017-12-04 DIAGNOSIS — G35 Multiple sclerosis: Secondary | ICD-10-CM | POA: Diagnosis not present

## 2017-12-04 DIAGNOSIS — L89890 Pressure ulcer of other site, unstageable: Secondary | ICD-10-CM | POA: Diagnosis not present

## 2017-12-04 DIAGNOSIS — E119 Type 2 diabetes mellitus without complications: Secondary | ICD-10-CM

## 2017-12-04 DIAGNOSIS — G629 Polyneuropathy, unspecified: Secondary | ICD-10-CM

## 2017-12-04 DIAGNOSIS — F32A Depression, unspecified: Secondary | ICD-10-CM

## 2017-12-04 NOTE — Progress Notes (Signed)
Location:  [place] Room Number: 202-A Place of Service:  SNF (31) Provider:  Durenda Age, NP  Patient Care Team: [name], MD as PCP - General (Internal Medicine) Medina-Vargas, Senaida Lange, NP as Nurse Practitioner (Internal Medicine)  Extended Emergency Contact Information Primary Emergency Contact: Ferdinand Lango [place] Phone: [phone] Relation: Son Secondary Emergency Contact: Salli Quarry [place] Phone: [phone] Mobile Phone: [phone] Relation: Son  Code Status:  DNR  Goals of care: Advanced Directive information Advanced Directives 11/13/2017  Does Patient Have a Medical Advance Directive? Yes  Type of Advance Directive Out of facility DNR (pink MOST or yellow form)  Does patient want to make changes to medical advance directive? No - Patient declined  Copy of [place] in Chart? No - copy requested  Would patient like information on creating a medical advance directive? -  Pre-existing out of facility DNR order (yellow form or pink MOST form) Yellow form placed in chart (order not valid for inpatient use)     Chief Complaint  Patient presents with  . Medical Management of Chronic Issues    Patient is seen for a routine [facility] visit    HPI:  Pt is a 77 y.o. female seen today for medical management of chronic diseases.  She is a long-term care resident of [facility].  She has a PMH of IDDM2, MS, hypertension, spinal stenosis, neurogenic bladder with suprapubic catheter, decubitus ulcers, and complicated UTIs. She was seen in the room today. She was seen in the room today. She had changed suprapubic catheter yesterday. Noted clear yellowish urine draining to urine bag. She was able to turn to her side using her arms upon assessment of multiple wounds.    Past Medical History:  Diagnosis Date  .  Degenerative arthritis   . Diabetes mellitus without complication ([place])   . Gait disturbance   . Hypertension   . Multiple sclerosis ([place])   . Neuromuscular disorder (HCC)    Bilateral hand carpal tunnel syndrome  . Obesity   . Spinal stenosis in cervical region   . Spinal stenosis of lumbosacral region   . Spinal stenosis, thoracic    Past Surgical History:  Procedure Laterality Date  . ABDOMINAL HYSTERECTOMY    . BACK SURGERY    . CATARACT EXTRACTION Right   . CHOLECYSTECTOMY    . FEMUR IM NAIL Right 06/02/2014   Procedure: INTRAMEDULLARY (IM) RETROGRADE FEMORAL NAILING;  Surgeon: Johnny Bridge, MD;  Location: [place];  Service: Orthopedics;  Laterality: Right;  . GROIN DISSECTION Left 10/24/2017   Procedure: IRRIGATION AND DEBRIDEMENT, LEFT THIGH ABCESS ;  Surgeon: Alphonsa Overall, MD;  [facility];  Service: General;  Laterality: Left;  . IR CATHETER TUBE CHANGE  10/13/2017  . left hand carpal tunnel surgery    . REPLACEMENT TOTAL KNEE BILATERAL  2004    Allergies  Allergen Reactions  . Codeine Other (See Comments)    Difficult breathing and skin problem  . Ultram [Tramadol] Other (See Comments)    Difficult breathing and skin peeling  . Januvia [Sitagliptin] Rash    Blisters    Outpatient Encounter Medications as of 12/04/2017  Medication Sig  . acetaminophen (TYLENOL) 325 MG tablet Take 650 mg by mouth every 6 (six) hours as needed.   . Amino Acids-Protein Hydrolys (FEEDING SUPPLEMENT, PRO-STAT SUGAR FREE 64,)  LIQD Take 30 mLs by mouth 2 (two) times daily.  . ARTIFICIAL TEAR OP Apply 1 drop to eye 4 (four) times daily as needed (dry eyes).   . baclofen (LIORESAL) 10 MG tablet 1.5 tablet in the morning, 1 tablet midday, two tablets at night  . bisacodyl (DULCOLAX) 10 MG suppository Place 10 mg rectally daily as needed for moderate constipation.  . calcium citrate (CALCITRATE - DOSED IN MG ELEMENTAL CALCIUM) 950 MG tablet Take 200 mg of elemental calcium by mouth daily.    . cholecalciferol (VITAMIN D) 1000 UNITS tablet Take 1,000 Units by mouth daily.  . collagenase (SANTYL) ointment Apply topically daily.  . Dimethyl Fumarate (TECFIDERA) 240 MG CPDR Take 1 capsule (240 mg total) by mouth 2 (two) times daily.  . DULoxetine (CYMBALTA) 30 MG capsule Take 90 mg by mouth daily.   Marland Kitchen gabapentin (NEURONTIN) 300 MG capsule Take 1 capsule (300 mg total) by mouth 2 (two) times daily.  . Insulin Aspart (NOVOLOG FLEXPEN [place]) Inject 5-10 Units into the skin 3 (three) times daily before meals. If CBG 200-300 give 5 units, 300-400 give 10 units, greater than 400 call provider  . insulin glargine (LANTUS) 100 UNIT/ML injection Inject 0.2 mLs (20 Units total) into the skin at bedtime.  . magnesium hydroxide (MILK OF MAGNESIA) 400 MG/5ML suspension Take 30 mLs by mouth daily as needed for mild constipation.  . methimazole (TAPAZOLE) 5 MG tablet Take 1 tablet (5 mg total) by mouth daily.  . Multiple Vitamin (MULTIVITAMIN) tablet Take 1 tablet by mouth daily.  . nutrition supplement, JUVEN, (JUVEN) PACK Take 1 packet by mouth daily.  Marland Kitchen NUTRITIONAL SUPPLEMENT LIQD Take 120 mLs by mouth 2 (two) times daily. NSA MedPass  . pantoprazole (PROTONIX) 40 MG tablet Take 40 mg by mouth daily.  . polyethylene glycol (MIRALAX / GLYCOLAX) packet Take 17 g by mouth daily as needed for mild constipation.   . predniSONE (DELTASONE) 5 MG tablet 1 tablet on odd days, one half tablet on even days  . senna-docusate (SENOKOT-S) 8.6-50 MG tablet Take 2 tablets by mouth at bedtime.  . Skin Protectants, Misc. (EUCERIN) cream Apply 1 application topically 2 (two) times daily.  Marland Kitchen sulfamethoxazole-trimethoprim (BACTRIM DS,SEPTRA DS) 800-160 MG tablet Take 1 tablet by mouth 2 (two) times daily.  Marland Kitchen tolterodine (DETROL LA) 4 MG 24 hr capsule Take 4 mg by mouth daily.    No facility-administered encounter medications on file as of 12/04/2017.     Review of Systems  GENERAL: No change in appetite, no fatigue,  no weight changes, no fever, chills or weakness MOUTH and THROAT: Denies oral discomfort, gingival pain or bleeding RESPIRATORY: no cough, SOB, DOE, wheezing, hemoptysis CARDIAC: No chest pain, edema or palpitations GI: No abdominal pain, diarrhea, constipation, heart burn, nausea or vomiting PSYCHIATRIC: Denies feelings of depression or anxiety. No report of hallucinations, insomnia, paranoia, or agitation    There is no immunization history on file for this patient. Pertinent  Health Maintenance Due  Topic Date Due  . FOOT EXAM  11/05/1950  . OPHTHALMOLOGY EXAM  11[birth year]05/1950  . URINE MICROALBUMIN  11[birth year]05/1952  . COLONOSCOPY  11/05/1992  . PNA vac Low Risk Adult (1 of 2 - PCV13) 11/06/2007  . DEXA SCAN  01/03/2018 (Originally 11/06/2007)  . INFLUENZA VACCINE  01/29/2018  . HEMOGLOBIN A1C  04/26/2018   Fall Risk  11/13/2017 10/14/2017  Falls in the past year? No No    Vitals:   12/04/17 1018  BP: 122/76  Pulse: 72  Resp: 16  Temp: 97.6 F (36.4 C)  TempSrc: Oral  SpO2: 100%  Weight: 184 lb 6.4 oz (83.6 kg)  Height: 5\' 2"  (1.575 m)   Body mass index is 33.73 kg/m. Left thigh wound c Physical Exam  GENERAL APPEARANCE: Well nourished. In no acute distress. Obese SKIN:  Left thigh wound is superficial, dry and no redness; sacral wound and right ischium is pinkish in the middle with whitish tissues/no redness;  right heel with open wound and  serous drainageand  MOUTH and THROAT: Lips are without lesions. Oral mucosa is . Tongue is normal in shape, size, and color and without lesions RESPIRATORY: Breathing is even & unlabored, BS CTAB CARDIAC: RRR, no murmur,no extra heart sounds, no edema GI: Abdomen soft, normal BS, no masses, no tenderness, suprapubic catheter draining to urine bag with clear yellowish urine EXTREMITIES:  Able to move BUE, not able to move BLE; paraplegic PSYCHIATRIC: Alert and oriented X 3. Affect and behavior are appropriate    Labs reviewed: Recent  Labs    10/27/17 0536 10/28/17 0554 10/29/17 0530  NA 140 142 141  K 3.7 3.6 3.2*  CL 97* 97* 94*  CO2 33* 34* 33*  GLUCOSE 96 142* 92  BUN 13 12 12   CREATININE 0.54 0.49 0.47  CALCIUM 9.1 8.9 9.0  MG 1.7 1.5* 1.5*  PHOS 3.2 3.3 2.7   Recent Labs    10/27/17 0536 10/28/17 0554 10/29/17 0530  AST 13* 12* 14*  ALT 9* 8* 9*  ALKPHOS 70 66 67  BILITOT 0.4 0.5 0.4  PROT 6.1* 6.0* 6.3*  ALBUMIN 2.2* 2.1* 2.3*   Recent Labs    10/27/17 0536 10/28/17 0554 10/29/17 0530  WBC 8.9 9.2 12.0*  NEUTROABS 6.3 6.0 7.5  HGB 9.4* 8.8* 9.0*  HCT 29.9* 28.1* 29.1*  MCV 81.3 81.9 81.1  PLT 701* 610* 677*   Lab Results  Component Value Date   TSH 0.182 (L) 10/24/2017   Lab Results  Component Value Date   HGBA1C 6.9 (H) 10/25/2017    Assessment/Plan:  1. Multiple sclerosis (HCC) - stable, continue Tecfidera 240 mg BID, Prednisone 5 mg Q other day, 2.5 mg  Q other day, Baclofen 10 mg  1 1/2 tab = 15 mg Q AM and 10 mg mid day for muscle spasm and 20 mg Q HS   2. Neurogenic bladder - continue Tolterodine ER 4 mg daily, suprapubic catheter care daily   3. Pressure injury of other site, unstageable ([place]) - continue treatment daily, turn to sides, monitor for infection   4. Chronic depression - mood is stable, continue Duloxetin 30 mg give 3 capsules = 90 mg daily   5. Diabetes mellitus type 2, insulin dependent (HCC) - well-controlled, continue Lantus 100 units/ml 20 units SQ Q HS, Novolog sliding scale TID Lab Results  Component Value Date   HGBA1C 6.9 (H) 10/25/2017    6. Neuropathy - stable, continue Gabapentin 300 mg BID  7. Hyperthyroidism - continue Methimazole 5 mg daily, for repeat tsh on 12/12/17 Lab Results  Component Value Date   TSH 0.182 (L) 10/24/2017        Family/ staff Communication:  Discussed plan of care with resident.  Labs/tests ordered: None  Goals of care:   Long-term care   Durenda Age, NP [facility] [phone] (Monday-Friday 8:00 a.m. - 5:00 p.m.) [phone] (after hours)

## 2017-12-08 DIAGNOSIS — L89152 Pressure ulcer of sacral region, stage 2: Secondary | ICD-10-CM | POA: Diagnosis not present

## 2017-12-08 DIAGNOSIS — L89613 Pressure ulcer of right heel, stage 3: Secondary | ICD-10-CM | POA: Diagnosis not present

## 2017-12-08 DIAGNOSIS — L89214 Pressure ulcer of right hip, stage 4: Secondary | ICD-10-CM | POA: Diagnosis not present

## 2017-12-12 ENCOUNTER — Encounter (HOSPITAL_COMMUNITY): Payer: Self-pay

## 2017-12-12 ENCOUNTER — Other Ambulatory Visit: Payer: Self-pay

## 2017-12-12 ENCOUNTER — Emergency Department (HOSPITAL_COMMUNITY)
Admission: EM | Admit: 2017-12-12 | Discharge: 2017-12-12 | Disposition: A | Discharge status: Home / Self Care | Payer: Medicare Other | Attending: Emergency Medicine | Admitting: Emergency Medicine

## 2017-12-12 DIAGNOSIS — Z794 Long term (current) use of insulin: Secondary | ICD-10-CM | POA: Diagnosis not present

## 2017-12-12 DIAGNOSIS — Z79899 Other long term (current) drug therapy: Secondary | ICD-10-CM | POA: Diagnosis not present

## 2017-12-12 DIAGNOSIS — T83198A Other mechanical complication of other urinary devices and implants, initial encounter: Secondary | ICD-10-CM | POA: Diagnosis not present

## 2017-12-12 DIAGNOSIS — Z96653 Presence of artificial knee joint, bilateral: Secondary | ICD-10-CM | POA: Diagnosis not present

## 2017-12-12 DIAGNOSIS — Z7401 Bed confinement status: Secondary | ICD-10-CM | POA: Diagnosis not present

## 2017-12-12 DIAGNOSIS — Y658 Other specified misadventures during surgical and medical care: Secondary | ICD-10-CM | POA: Diagnosis not present

## 2017-12-12 DIAGNOSIS — T83010A Breakdown (mechanical) of cystostomy catheter, initial encounter: Secondary | ICD-10-CM | POA: Insufficient documentation

## 2017-12-12 DIAGNOSIS — E785 Hyperlipidemia, unspecified: Secondary | ICD-10-CM | POA: Diagnosis not present

## 2017-12-12 DIAGNOSIS — Z960 Presence of urogenital implants: Secondary | ICD-10-CM | POA: Diagnosis not present

## 2017-12-12 DIAGNOSIS — E039 Hypothyroidism, unspecified: Secondary | ICD-10-CM | POA: Diagnosis not present

## 2017-12-12 DIAGNOSIS — M2550 Pain in unspecified joint: Secondary | ICD-10-CM | POA: Diagnosis not present

## 2017-12-12 DIAGNOSIS — E119 Type 2 diabetes mellitus without complications: Secondary | ICD-10-CM | POA: Insufficient documentation

## 2017-12-12 LAB — TSH: TSH: 0.91 (ref 0.41–5.90)

## 2017-12-12 MED ORDER — SODIUM CHLORIDE 0.9 % IV SOLN
1.0000 g | Freq: Once | INTRAVENOUS | Status: AC
  Administered 2017-12-12: 1 g via INTRAVENOUS
  Filled 2017-12-12: qty 10

## 2017-12-12 NOTE — ED Notes (Signed)
[facility] called for patient transport back to [facility]

## 2017-12-12 NOTE — Discharge Instructions (Addendum)
You were seen in the emergency department for a suprapubic catheter that became dislodged.  Urology was ultimately able to pass a 12 [place] catheter in.  He will need to follow-up with Dr. Gloriann Loan and likely you will need to be scheduled for another procedure to dilate the opening to get the 14 catheter back in.  Please call your doctor if any concerning symptoms.

## 2017-12-12 NOTE — ED Provider Notes (Signed)
[facility] DEPT Provider Note   CSN: [id number] Arrival date & time: 12/12/17  1518     History   Chief Complaint Chief Complaint  Patient presents with  . Suprapubic catheter replacement    HPI Laura Roberts is a 77 y.o. female.  She presents after having her suprapubic catheter dislodged since probably last night.  She is unclear how long she is had edematous sounds like at least a couple of months.  Thinks is a 8 [place].  She has a catheter in place because she was chronic Foley dependent due to her multiple sclerosis.  She denies any fever chest pain abdominal pain.  States all her MS symptoms are otherwise baseline.  The history is provided by the patient.  Female GU Problem  This is a new problem. The current episode started yesterday. The problem has not changed since onset.Pertinent negatives include no chest pain, no abdominal pain, no headaches and no shortness of breath. Nothing aggravates the symptoms. Nothing relieves the symptoms. She has tried nothing for the symptoms. The treatment provided no relief.    Past Medical History:  Diagnosis Date  . Degenerative arthritis   . Diabetes mellitus without complication ([place])   . Gait disturbance   . Hypertension   . Multiple sclerosis ([place])   . Neuromuscular disorder (HCC)    Bilateral hand carpal tunnel syndrome  . Obesity   . Spinal stenosis in cervical region   . Spinal stenosis of lumbosacral region   . Spinal stenosis, thoracic     Patient Active Problem List   Diagnosis Date Noted  . Abnormal TSH 10/30/2017  . Suprapubic catheter ([place]) 10/29/2017  . Inguinal abscess 10/24/2017  . Hypokalemia   . Diabetes mellitus type 2, insulin dependent ([place])   . Hypomagnesemia   . UTI (urinary tract infection) 10/09/2017  . Complicated UTI (urinary tract infection) 10/09/2017  . Febrile illness   . UTI (urinary tract infection) due to urinary indwelling Foley catheter ([place]) 08/13/2017   . Decubitus ulcer of buttock, stage 4 ([place]) 11/22/2016  . Sepsis ([place]) 11/22/2016  . Leukocytosis   . Paraplegia ([place]) 06/01/2014  . Closed supracondylar fracture of right femur, periprosthetic 06/01/2014  . Femoral fracture ([place]) 06/01/2014  . Encounter for therapeutic drug monitoring 01/05/2014  . Foot drop, bilateral 10/13/2012  . Neurogenic bladder 09/02/2012  . Neurogenic pain of lower extremity 09/02/2012  . HTN (hypertension) 07/21/2012  . HLD (hyperlipidemia) 07/21/2012  . GERD (gastroesophageal reflux disease) 07/21/2012  . Multiple sclerosis ([place])     Past Surgical History:  Procedure Laterality Date  . ABDOMINAL HYSTERECTOMY    . BACK SURGERY    . CATARACT EXTRACTION Right   . CHOLECYSTECTOMY    . FEMUR IM NAIL Right 06/02/2014   Procedure: INTRAMEDULLARY (IM) RETROGRADE FEMORAL NAILING;  Surgeon: Johnny Bridge, MD;  Location: [facility];  Service: Orthopedics;  Laterality: Right;  . GROIN DISSECTION Left 10/24/2017   Procedure: IRRIGATION AND DEBRIDEMENT, LEFT THIGH ABCESS ;  Surgeon: Alphonsa Overall, MD;  Location: [facility];  Service: General;  Laterality: Left;  . IR CATHETER TUBE CHANGE  10/13/2017  . left hand carpal tunnel surgery    . REPLACEMENT TOTAL KNEE BILATERAL  2004     OB History   None      Home Medications    Prior to Admission medications   Medication Sig Start Date End Date Taking? Authorizing Provider  acetaminophen (TYLENOL) 325 MG tablet Take 650 mg by mouth  every 6 (six) hours as needed.     [provider]  Amino Acids-Protein Hydrolys (FEEDING SUPPLEMENT, PRO-STAT SUGAR FREE 64,) LIQD Take 30 mLs by mouth 2 (two) times daily. 11/26/16   Minus Liberty, MD  ARTIFICIAL TEAR OP Apply 1 drop to eye 4 (four) times daily as needed (dry eyes).     [provider]  baclofen (LIORESAL) 10 MG tablet 1.5 tablet in the morning, 1 tablet midday, two tablets at night 11/29/15   Ward Givens, NP  bisacodyl (DULCOLAX) 10 MG suppository  Place 10 mg rectally daily as needed for moderate constipation.    [provider]  calcium citrate (CALCITRATE - DOSED IN MG ELEMENTAL CALCIUM) 950 MG tablet Take 200 mg of elemental calcium by mouth daily.    [provider]  cholecalciferol (VITAMIN D) 1000 UNITS tablet Take 1,000 Units by mouth daily.    [provider]  collagenase (SANTYL) ointment Apply topically daily. 10/30/17   Debbe Odea, MD  Dimethyl Fumarate (TECFIDERA) 240 MG CPDR Take 1 capsule (240 mg total) by mouth 2 (two) times daily. 03/13/16   Kathrynn Ducking, MD  DULoxetine (CYMBALTA) 30 MG capsule Take 90 mg by mouth daily.     [provider]  gabapentin (NEURONTIN) 300 MG capsule Take 1 capsule (300 mg total) by mouth 2 (two) times daily. 08/18/17   Colbert Ewing, MD  Insulin Aspart (NOVOLOG FLEXPEN [place]) Inject 5-10 Units into the skin 3 (three) times daily before meals. If CBG 200-300 give 5 units, 300-400 give 10 units, greater than 400 call provider    [provider]  insulin glargine (LANTUS) 100 UNIT/ML injection Inject 0.2 mLs (20 Units total) into the skin at bedtime. 10/14/17   Katherine Roan, MD  magnesium hydroxide (MILK OF MAGNESIA) 400 MG/5ML suspension Take 30 mLs by mouth daily as needed for mild constipation.    [provider]  methimazole (TAPAZOLE) 5 MG tablet Take 1 tablet (5 mg total) by mouth daily. 11/11/17   Renato Shin, MD  Multiple Vitamin (MULTIVITAMIN) tablet Take 1 tablet by mouth daily.    [provider]  nutrition supplement, JUVEN, (JUVEN) PACK Take 1 packet by mouth daily. 10/30/17   Debbe Odea, MD  NUTRITIONAL SUPPLEMENT LIQD Take 120 mLs by mouth 2 (two) times daily. NSA MedPass    [provider]  pantoprazole (PROTONIX) 40 MG tablet Take 40 mg by mouth daily.    [provider]  polyethylene glycol (MIRALAX / GLYCOLAX) packet Take 17 g by mouth daily as needed for mild constipation.     [provider]  predniSONE (DELTASONE) 5 MG tablet 1 tablet on odd days, one half tablet on even days 11/22/14   Kathrynn Ducking, MD  senna-docusate (SENOKOT-S) 8.6-50 MG tablet Take 2 tablets by mouth at bedtime.    [provider]  Skin Protectants, Misc. (EUCERIN) cream Apply 1 application topically 2 (two) times daily.    [provider]  sulfamethoxazole-trimethoprim (BACTRIM DS,SEPTRA DS) 800-160 MG tablet Take 1 tablet by mouth 2 (two) times daily. 11/09/17   Maczis, Barth Kirks, PA-C  tolterodine (DETROL LA) 4 MG 24 hr capsule Take 4 mg by mouth daily.     [provider]    Family History Family History  Problem Relation Age of Onset  . Multiple sclerosis Other        neices.   . Cancer Mother   . Diabetes Mother   . GI Bleed  Sister        diverticulitis  . Thyroid disease Neg Hx     Social History Social History   Tobacco Use  . Smoking status: Never Smoker  . Smokeless tobacco: Never Used  Substance Use Topics  . Alcohol use: No  . Drug use: No     Allergies   Codeine; Ultram [tramadol]; and Januvia [sitagliptin]   Review of Systems Review of Systems  Constitutional: Negative for fever.  HENT: Negative for sore throat.   Eyes: Negative for pain.  Respiratory: Negative for shortness of breath.   Cardiovascular: Negative for chest pain.  Gastrointestinal: Negative for abdominal pain.  Genitourinary: Positive for decreased urine volume and difficulty urinating.  Musculoskeletal: Negative for joint swelling.  Skin: Negative for rash.  Neurological: Positive for weakness. Negative for speech difficulty and headaches.     Physical Exam Updated Vital Signs BP 127/75   Pulse (!) 110   Temp 98.3 F (36.8 C)   Resp 18   Ht 5\' 2"  (1.575 m)   Wt 83.5 kg (184 lb)   SpO2 97%   BMI 33.65 kg/m   Physical Exam  Constitutional: She appears well-developed and well-nourished.  HENT:  Head: Normocephalic and atraumatic.  Eyes:  Conjunctivae are normal.  Neck: Neck supple.  Cardiovascular: Regular rhythm, normal heart sounds and intact distal pulses.  Pulmonary/Chest: Effort normal. She has no wheezes. She has no rales.  Abdominal: Soft. She exhibits no mass. There is no tenderness. There is no guarding.  Musculoskeletal: She exhibits no deformity.  Neurological: She is alert. GCS eye subscore is 4. GCS verbal subscore is 5. GCS motor subscore is 6.  Skin: Skin is warm and dry. Capillary refill takes less than 2 seconds.  Psychiatric: She has a normal mood and affect.     ED Treatments / Results  Labs (all labs ordered are listed, but only abnormal results are displayed) Labs Reviewed - No data to display  EKG None  Radiology No results found.  Procedures Procedures (including critical care time)  Medications Ordered in ED Medications - No data to display   Initial Impression / Assessment and Plan / ED Course  I have reviewed the triage vital signs and the nursing notes.  Pertinent labs & imaging results that were available during my care of the patient were reviewed by me and considered in my medical decision making (see chart for details).  Clinical Course as of Dec 13 1041  Fri Dec 12, 2017  1633 Attempted to replace the catheter with 14 French Foley.  I was able to access the initial skin opening but could not find the tract to get its place into the bladder.   [MB]  1649 With Dr. Junious Silk from urology who will evaluate the patient in the ed for replacement.   [MB]  1801 Patient has evaluated the urology fellow who ultimately was able to place a 12 Foley through the opening after guidewire.  He recommends dose antibiotics and follow-up with Dr. Gloriann Loan as an outpatient.  She will likely need to get that excised again.   [MB]    Clinical Course User Index [MB] Hayden Rasmussen, MD   Final Clinical Impressions(s) / ED Diagnoses   Final diagnoses:  Suprapubic catheter dysfunction, initial  encounter [facility])    ED Discharge Orders    None       Hayden Rasmussen, MD 12/13/17 1044

## 2017-12-12 NOTE — ED Notes (Signed)
Bed: [facility] Expected date:  Expected time:  Means of arrival:  Comments: EMS-supra pubic cath out

## 2017-12-12 NOTE — ED Notes (Signed)
Urologist at patient bedside

## 2017-12-12 NOTE — ED Triage Notes (Signed)
Patient BIB [facility] with complaints of suprapubic catheter displaced- unsure what time. Patient lives at [facility]- staff reported the patient's previous catheter was a 41F.

## 2017-12-12 NOTE — ED Notes (Signed)
Patient provided with dinner tray.

## 2017-12-12 NOTE — Consult Note (Signed)
Urology Consult Note   Requesting Attending Physician:  Hayden Rasmussen, MD Service Providing Consult: Urology  Consulting Attending: Alinda Money   Reason for Consult:  Dislodged SPT  HPI: Laura Roberts is seen in consultation for reasons noted above at the request of Hayden Rasmussen, MD for evaluation of dislodged SPT.  This is a 77 y.o. female with hx of DM, HTN, MS and NGB who presents with a dislodged SPT. Was showering and her SPT was pulled out while transferring. States the balloon was down. No bleeding for SPT tracted noted. Began leaking from her urethra soon after.   Hx of MS and NGB. Managed with foley for some time and recently transitioned to SPT. Last SPT change in 10/24/17 to a 30fr catheter under fluoroscopic guidance - uncomplicated.   Urology was able to place a 24fr catheter through the SPT tract with the assistance of a wire. The tract was patent but edematous, making passage of a 19fr catheter difficult. See procedure note below.   Denies fever, chills, nausea and vomiting.  No abdominal pain or flank pain.    Past Medical History: Past Medical History:  Diagnosis Date  . Degenerative arthritis   . Diabetes mellitus without complication ([place])   . Gait disturbance   . Hypertension   . Multiple sclerosis ([place])   . Neuromuscular disorder (HCC)    Bilateral hand carpal tunnel syndrome  . Obesity   . Spinal stenosis in cervical region   . Spinal stenosis of lumbosacral region   . Spinal stenosis, thoracic     Past Surgical History:  Past Surgical History:  Procedure Laterality Date  . ABDOMINAL HYSTERECTOMY    . BACK SURGERY    . CATARACT EXTRACTION Right   . CHOLECYSTECTOMY    . FEMUR IM NAIL Right 06/02/2014   Procedure: INTRAMEDULLARY (IM) RETROGRADE FEMORAL NAILING;  Surgeon: Johnny Bridge, MD;  Location: [facility];  Service: Orthopedics;  Laterality: Right;  . GROIN DISSECTION Left 10/24/2017   Procedure: IRRIGATION AND DEBRIDEMENT, LEFT THIGH ABCESS ;   Surgeon: Alphonsa Overall, MD;  Location: [facility];  Service: General;  Laterality: Left;  . IR CATHETER TUBE CHANGE  10/13/2017  . left hand carpal tunnel surgery    . REPLACEMENT TOTAL KNEE BILATERAL  2004    Medication: Current Facility-Administered Medications  Medication Dose Route Frequency Provider Last Rate Last Dose  . cefTRIAXone (ROCEPHIN) 1 g in sodium chloride 0.9 % 100 mL IVPB  1 g Intravenous Once Hayden Rasmussen, MD       Current Outpatient Medications  Medication Sig Dispense Refill  . acetaminophen (TYLENOL) 325 MG tablet Take 650 mg by mouth every 6 (six) hours as needed.     . Amino Acids-Protein Hydrolys (FEEDING SUPPLEMENT, PRO-STAT SUGAR FREE 64,) LIQD Take 30 mLs by mouth 2 (two) times daily. 900 mL 0  . ARTIFICIAL TEAR OP Apply 1 drop to eye 4 (four) times daily as needed (dry eyes).     . baclofen (LIORESAL) 10 MG tablet 1.5 tablet in the morning, 1 tablet midday, two tablets at night 135 tablet 5  . bisacodyl (DULCOLAX) 10 MG suppository Place 10 mg rectally daily as needed for moderate constipation.    . calcium citrate (CALCITRATE - DOSED IN MG ELEMENTAL CALCIUM) 950 MG tablet Take 200 mg of elemental calcium by mouth daily.    . cholecalciferol (VITAMIN D) 1000 UNITS tablet Take 1,000 Units by mouth daily.    . collagenase (SANTYL) ointment Apply  topically daily. 15 g 0  . Dimethyl Fumarate (TECFIDERA) 240 MG CPDR Take 1 capsule (240 mg total) by mouth 2 (two) times daily. 60 capsule 11  . DULoxetine (CYMBALTA) 30 MG capsule Take 90 mg by mouth daily.     Marland Kitchen gabapentin (NEURONTIN) 300 MG capsule Take 1 capsule (300 mg total) by mouth 2 (two) times daily. 60 capsule 0  . Insulin Aspart (NOVOLOG FLEXPEN [place]) Inject 5-10 Units into the skin 3 (three) times daily before meals. If CBG 200-300 give 5 units, 300-400 give 10 units, greater than 400 call provider    . insulin glargine (LANTUS) 100 UNIT/ML injection Inject 0.2 mLs (20 Units total) into the skin at bedtime. 10  mL 11  . magnesium hydroxide (MILK OF MAGNESIA) 400 MG/5ML suspension Take 30 mLs by mouth daily as needed for mild constipation.    . methimazole (TAPAZOLE) 5 MG tablet Take 1 tablet (5 mg total) by mouth daily. 30 tablet 5  . Multiple Vitamin (MULTIVITAMIN) tablet Take 1 tablet by mouth daily.    . nutrition supplement, JUVEN, (JUVEN) PACK Take 1 packet by mouth daily.  0  . NUTRITIONAL SUPPLEMENT LIQD Take 120 mLs by mouth 2 (two) times daily. NSA MedPass    . pantoprazole (PROTONIX) 40 MG tablet Take 40 mg by mouth daily.    . polyethylene glycol (MIRALAX / GLYCOLAX) packet Take 17 g by mouth daily as needed for mild constipation.     . predniSONE (DELTASONE) 5 MG tablet 1 tablet on odd days, one half tablet on even days    . senna-docusate (SENOKOT-S) 8.6-50 MG tablet Take 2 tablets by mouth at bedtime.    . Skin Protectants, Misc. (EUCERIN) cream Apply 1 application topically 2 (two) times daily.    Marland Kitchen sulfamethoxazole-trimethoprim (BACTRIM DS,SEPTRA DS) 800-160 MG tablet Take 1 tablet by mouth 2 (two) times daily. 10 tablet 0  . tolterodine (DETROL LA) 4 MG 24 hr capsule Take 4 mg by mouth daily.       Allergies: Allergies  Allergen Reactions  . Codeine Other (See Comments)    Difficult breathing and skin problem  . Ultram [Tramadol] Other (See Comments)    Difficult breathing and skin peeling  . Januvia [Sitagliptin] Rash    Blisters    Social History: Social History   Tobacco Use  . Smoking status: Never Smoker  . Smokeless tobacco: Never Used  Substance Use Topics  . Alcohol use: No  . Drug use: No    Family History Family History  Problem Relation Age of Onset  . Multiple sclerosis Other        neices.   . Cancer Mother   . Diabetes Mother   . GI Bleed Sister        diverticulitis  . Thyroid disease Neg Hx     Review of Systems 10 systems were reviewed and are negative except as noted specifically in the HPI.  Objective   Vital signs in last 24  hours: BP 134/87   Pulse 99   Temp 98.3 F (36.8 C)   Resp 18   Ht 5\' 2"  (1.575 m)   Wt 83.5 kg (184 lb)   SpO2 97%   BMI 33.65 kg/m   Physical Exam General: NAD, A&O, resting, appropriate HEENT: [place]/AT, EOMI, MMM Pulmonary: Normal work of breathing Cardiovascular: HDS, adequate peripheral perfusion Abdomen: Soft, NTTP, nondistended, SPT tract in lower abdomen, 65fr catheter draining light pink urine. GU: No CVA tenderness Extremities: warm and  well perfused Neuro: spastic LE`  Most Recent Labs: Lab Results  Component Value Date   WBC 12.0 (H) 10/29/2017   HGB 9.0 (L) 10/29/2017   HCT 29.1 (L) 10/29/2017   PLT 677 (H) 10/29/2017    Lab Results  Component Value Date   NA 141 10/29/2017   K 3.2 (L) 10/29/2017   CL 94 (L) 10/29/2017   CO2 33 (H) 10/29/2017   BUN 12 10/29/2017   CREATININE 0.47 10/29/2017   CALCIUM 9.0 10/29/2017   MG 1.5 (L) 10/29/2017   PHOS 2.7 10/29/2017    Lab Results  Component Value Date   INR 1.01 10/13/2017     IMAGING: No results found.  Foley Catheter Placement Note  Indications: 77 y.o. female with MS and NGB with dislodged SPT  Pre-operative Diagnosis: Dislodged SPT  Post-operative Diagnosis: Same  Surgeon: Alla Feeling, MD  Assistants: None  Procedure Details  Patient was placed in the supine position, prepped with Betadine and draped in the usual sterile fashion. We attempted to insert a 55fr catheter and 38fr catheter but were unable to advance them more than 1cm. A pediatric 17fr bougie was used to probe the tract and able to be advanced to a depth consistent with the bladder. Next a 41fr open ended catheter was inserted through the SPT tract into the bladder - urine was aspirated to confirm. Next a sensor wire was passed throught the 53fr catheter. A 23fr catheter was turned into a council tip via a 14r angio cath. The foley was advanced over the sensor wire into the bladder. The wire removed and the catheter irrigated,  delivering light red urine. The catheter was attached to a bag.                Complications: None; patient tolerated the procedure well.  Plan:   1. See below       Attending Attestation: Dr. Alinda Money was available.  ------  Assessment:  77 y.o. female with hx of MS and NGB with SPT that was dislodged. Catheter replaced at bedside with assistance of a wire. Tract edematous but patent.    Recommendations: - OK to discharge from ED after dose of abx - Follow up with Dr. Gloriann Loan  - Patient will need upsizing again to 89fr by IR. [phone]   Thank you for this consult. Please contact the urology consult pager with any further questions/concerns.

## 2017-12-12 NOTE — ED Notes (Signed)
[facility] here to transport pt back to [facility].

## 2017-12-15 DIAGNOSIS — L89613 Pressure ulcer of right heel, stage 3: Secondary | ICD-10-CM | POA: Diagnosis not present

## 2017-12-15 DIAGNOSIS — L89152 Pressure ulcer of sacral region, stage 2: Secondary | ICD-10-CM | POA: Diagnosis not present

## 2017-12-15 DIAGNOSIS — L89214 Pressure ulcer of right hip, stage 4: Secondary | ICD-10-CM | POA: Diagnosis not present

## 2017-12-23 DIAGNOSIS — N312 Flaccid neuropathic bladder, not elsewhere classified: Secondary | ICD-10-CM | POA: Diagnosis not present

## 2017-12-25 DIAGNOSIS — L89214 Pressure ulcer of right hip, stage 4: Secondary | ICD-10-CM | POA: Diagnosis not present

## 2017-12-25 DIAGNOSIS — L89613 Pressure ulcer of right heel, stage 3: Secondary | ICD-10-CM | POA: Diagnosis not present

## 2017-12-25 DIAGNOSIS — L89152 Pressure ulcer of sacral region, stage 2: Secondary | ICD-10-CM | POA: Diagnosis not present

## 2018-01-06 DIAGNOSIS — N310 Uninhibited neuropathic bladder, not elsewhere classified: Secondary | ICD-10-CM | POA: Diagnosis not present

## 2018-01-06 DIAGNOSIS — N311 Reflex neuropathic bladder, not elsewhere classified: Secondary | ICD-10-CM | POA: Diagnosis not present

## 2018-01-12 ENCOUNTER — Non-Acute Institutional Stay (SKILLED_NURSING_FACILITY): Payer: Medicare Other | Admitting: Adult Health

## 2018-01-12 ENCOUNTER — Telehealth: Payer: Self-pay | Admitting: *Deleted

## 2018-01-12 ENCOUNTER — Encounter: Payer: Self-pay | Admitting: Adult Health

## 2018-01-12 DIAGNOSIS — E0590 Thyrotoxicosis, unspecified without thyrotoxic crisis or storm: Secondary | ICD-10-CM | POA: Diagnosis not present

## 2018-01-12 DIAGNOSIS — G35 Multiple sclerosis: Secondary | ICD-10-CM

## 2018-01-12 DIAGNOSIS — Z1211 Encounter for screening for malignant neoplasm of colon: Secondary | ICD-10-CM | POA: Diagnosis not present

## 2018-01-12 DIAGNOSIS — L89613 Pressure ulcer of right heel, stage 3: Secondary | ICD-10-CM | POA: Diagnosis not present

## 2018-01-12 DIAGNOSIS — Z794 Long term (current) use of insulin: Secondary | ICD-10-CM | POA: Diagnosis not present

## 2018-01-12 DIAGNOSIS — L89152 Pressure ulcer of sacral region, stage 2: Secondary | ICD-10-CM | POA: Diagnosis not present

## 2018-01-12 DIAGNOSIS — L89214 Pressure ulcer of right hip, stage 4: Secondary | ICD-10-CM | POA: Diagnosis not present

## 2018-01-12 DIAGNOSIS — E1140 Type 2 diabetes mellitus with diabetic neuropathy, unspecified: Secondary | ICD-10-CM | POA: Diagnosis not present

## 2018-01-12 DIAGNOSIS — N319 Neuromuscular dysfunction of bladder, unspecified: Secondary | ICD-10-CM

## 2018-01-12 DIAGNOSIS — L89154 Pressure ulcer of sacral region, stage 4: Secondary | ICD-10-CM | POA: Diagnosis not present

## 2018-01-12 MED ORDER — DIMETHYL FUMARATE 240 MG PO CPDR: 240.0000 mg | DELAYED_RELEASE_CAPSULE | Freq: Two times a day (BID) | ORAL | 11 refills | Status: DC

## 2018-01-12 NOTE — Telephone Encounter (Signed)
Patient called back. States [facility] phone number is: [phone].

## 2018-01-12 NOTE — Telephone Encounter (Signed)
Called pt to f/u on rx tecfidera and new insurance. She never called back to provide updated insurance information. She states she has Medicaid and part D([facility]). She did not have contact info to [facility]. She has on phone and will call me back to provide information.  She reports she is still taking Tecfidera 240mg  tab BID and has not missed any doses.

## 2018-01-12 NOTE — Progress Notes (Signed)
Location:  [facility] Room Number: 202-A Place of Service:  SNF (31) Provider:  Durenda Age, NP  No care team member to display  Extended Emergency Contact Information Primary Emergency Contact: Shinsato,Leroy III          [place], [place] Phone: [phone] Relation: Son Secondary Emergency Contact: Salli [place] Phone: [phone] Mobile Phone: [phone] Relation: Son  Code Status:  DNR  Goals of care: Advanced Directive information Advanced Directives 12/04/2017  Does Patient Have a Medical Advance Directive? Yes  Type of Advance Directive Out of facility DNR (pink MOST or yellow form)  Does patient want to make changes to medical advance directive? No - Patient declined  Copy of [place] in Chart? No - copy requested  Would patient like information on creating a medical advance directive? -  Pre-existing out of facility DNR order (yellow form or pink MOST form) -     Chief Complaint  Patient presents with  . Medical Management of Chronic Issues    The patient is seen for a routine [facility] visit    HPI:  Pt is a 77 y.o. female seen today for medical management of chronic diseases.  She is a long-term care resident of [facility].  She has a PMH of multiple sclerosis, IDDM2, hypertension, spinal stenosis, neurogenic bladder with suprapubic catheter, decubitus ulcers and complicated UTIs. She was seen in the room today. She was concerned about her Tecfidera medication refill. Charge nurse reported that her refills were approved already. Latest tsh 0.91 and free T4 is 1.39, within normal. She verbalized having bladder spasm at times and causing incontinence even with suprapubic catheter and will need follow-up with urologist.   Past Medical History:  Diagnosis Date  . Degenerative arthritis   . Diabetes mellitus without complication ([place])    . Gait disturbance   . Hypertension   . Multiple sclerosis ([place])   . Neuromuscular disorder (HCC)    Bilateral hand carpal tunnel syndrome  . Obesity   . Spinal stenosis in cervical region   . Spinal stenosis of lumbosacral region   . Spinal stenosis, thoracic    Past Surgical History:  Procedure Laterality Date  . ABDOMINAL HYSTERECTOMY    . BACK SURGERY    . CATARACT EXTRACTION Right   . CHOLECYSTECTOMY    . FEMUR IM NAIL Right 06/02/2014   Procedure: INTRAMEDULLARY (IM) RETROGRADE FEMORAL NAILING;  Surgeon: Johnny Bridge, MD;  Location: [facility];  Service: Orthopedics;  Laterality: Right;  . GROIN DISSECTION Left 10/24/2017   Procedure: IRRIGATION AND DEBRIDEMENT, LEFT THIGH ABCESS ;  Surgeon: Alphonsa Overall, MD;  Location: [facility];  Service: General;  Laterality: Left;  . IR CATHETER TUBE CHANGE  10/13/2017  . left hand carpal tunnel surgery    . REPLACEMENT TOTAL KNEE BILATERAL  2004    Allergies  Allergen Reactions  . Codeine Other (See Comments)    Difficult breathing and skin problem  . Ultram [Tramadol] Other (See Comments)    Difficult breathing and skin peeling  . Januvia [Sitagliptin] Rash    Blisters    Outpatient Encounter Medications as of 01/12/2018  Medication Sig  . acetaminophen (TYLENOL) 325 MG tablet Take 650 mg by mouth every 6 (six) hours as needed.   . Amino Acids-Protein Hydrolys (FEEDING SUPPLEMENT, PRO-STAT SUGAR FREE 64,) LIQD Take 30 mLs by mouth 2 (two) times daily.  . ARTIFICIAL TEAR OP  Apply 1 drop to eye 4 (four) times daily as needed (dry eyes).   . baclofen (LIORESAL) 10 MG tablet Take 10-20 mg by mouth See admin instructions. Give 1-1/2 tablets to = 15 mg QAM, 1 tablet to = 10 mg midday, take 2 tablets to = 20 mg qhs  . bisacodyl (DULCOLAX) 10 MG suppository Place 10 mg rectally daily as needed for moderate constipation.  . calcium citrate (CALCITRATE - DOSED IN MG ELEMENTAL CALCIUM) 950 MG tablet Take 200 mg of elemental calcium by mouth  daily.  . cholecalciferol (VITAMIN D) 1000 UNITS tablet Take 1,000 Units by mouth daily.  . Dimethyl Fumarate (TECFIDERA) 240 MG CPDR Take 1 capsule (240 mg total) by mouth 2 (two) times daily.  . DULoxetine (CYMBALTA) 30 MG capsule Take 90 mg by mouth daily.   Marland Kitchen gabapentin (NEURONTIN) 300 MG capsule Take 1 capsule (300 mg total) by mouth 2 (two) times daily.  . insulin aspart (NOVOLOG FLEXPEN) 100 UNIT/ML FlexPen Inject 5-10 Units into the skin 3 (three) times daily before meals. If CBG 200-300 give 5 units, 300-400 give 10 units, greater than 400 call provider  . insulin glargine (LANTUS) 100 UNIT/ML injection Inject 0.2 mLs (20 Units total) into the skin at bedtime.  . magnesium hydroxide (MILK OF MAGNESIA) 400 MG/5ML suspension Take 30 mLs by mouth daily as needed for mild constipation.  . methimazole (TAPAZOLE) 5 MG tablet Take 1 tablet (5 mg total) by mouth daily.  . Multiple Vitamin (MULTIVITAMIN) tablet Take 1 tablet by mouth daily.  . nutrition supplement, JUVEN, (JUVEN) PACK Take 1 packet by mouth daily.  Marland Kitchen NUTRITIONAL SUPPLEMENT LIQD Take 120 mLs by mouth 2 (two) times daily. NSA MedPass  . pantoprazole (PROTONIX) 40 MG tablet Take 40 mg by mouth daily.  . polyethylene glycol (MIRALAX / GLYCOLAX) packet Take 17 g by mouth daily as needed for mild constipation.   . predniSONE (DELTASONE) 5 MG tablet 1 tablet on odd days, one half tablet on even days  . senna-docusate (SENOKOT-S) 8.6-50 MG tablet Take 2 tablets by mouth at bedtime.  . Skin Protectants, Misc. (EUCERIN) cream Apply 1 application topically 2 (two) times daily.  Marland Kitchen tolterodine (DETROL LA) 4 MG 24 hr capsule Take 4 mg by mouth daily.   . [DISCONTINUED] baclofen (LIORESAL) 10 MG tablet 1.5 tablet in the morning, 1 tablet midday, two tablets at night  . [DISCONTINUED] collagenase (SANTYL) ointment Apply topically daily.  . [DISCONTINUED] sulfamethoxazole-trimethoprim (BACTRIM DS,SEPTRA DS) 800-160 MG tablet Take 1 tablet by  mouth 2 (two) times daily.   No facility-administered encounter medications on file as of 01/12/2018.     Review of Systems  GENERAL: No change in appetite, no fatigue, no weight changes, no fever, chills or weakness MOUTH and THROAT: Denies oral discomfort, gingival pain or bleeding, pain from teeth or hoarseness   RESPIRATORY: no cough, SOB, DOE, wheezing, hemoptysis CARDIAC: No chest pain, edema or palpitations GI: No abdominal pain, diarrhea, constipation, heart burn, nausea or vomiting PSYCHIATRIC: Denies feelings of depression or anxiety. No report of hallucinations, insomnia, paranoia, or agitation   Immunization History  Administered Date(s) Administered  . Influenza-Unspecified 04/28/2017  . Pneumococcal-Unspecified 03/31/2016, 11/18/2017  . Tdap 11/13/2017   Pertinent  Health Maintenance Due  Topic Date Due  . FOOT EXAM  11/05/1950  . OPHTHALMOLOGY EXAM  11/05/1952  . URINE MICROALBUMIN  11/05/1952  . COLONOSCOPY  11/05/1992  . INFLUENZA VACCINE  01/29/2018  . HEMOGLOBIN A1C  04/26/2018  . PNA  vac Low Risk Adult (2 of 2 - PCV13) 11/19/2018  . DEXA SCAN  Discontinued   Fall Risk  11/13/2017 10/14/2017  Falls in the past year? No No      Vitals:   01/12/18 1008  BP: 137/85  Pulse: 90  Resp: 20  Temp: 97.6 F (36.4 C)  TempSrc: Oral  SpO2: 94%  Weight: 184 lb 12.8 oz (83.8 kg)  Height: 5\' 2"  (1.575 m)   Body mass index is 33.8 kg/m.  Physical Exam  GENERAL APPEARANCE: Well nourished. In no acute distress. Obese SKIN:  Right buttock pressure ulcer with dressing MOUTH and THROAT: Lips are without lesions. Oral mucosa is moist and without lesions. Tongue is normal in shape, size, and color and without lesions RESPIRATORY: Breathing is even & unlabored, BS CTAB CARDIAC: RRR, no murmur,no extra heart sounds, no edema GI: Abdomen soft, normal BS, no masses, no tenderness GU;  Has suprapubic catheter EXTREMITIES:  Able to move BUE, cannot move  BLE NEUROLOGICAL: There is no tremor. Speech is clear PSYCHIATRIC: Alert and oriented X 3. Affect and behavior are appropriate  Labs reviewed: Recent Labs    10/27/17 0536 10/28/17 0554 10/29/17 0530  NA 140 142 141  K 3.7 3.6 3.2*  CL 97* 97* 94*  CO2 33* 34* 33*  GLUCOSE 96 142* 92  BUN 13 12 12   CREATININE 0.54 0.49 0.47  CALCIUM 9.1 8.9 9.0  MG 1.7 1.5* 1.5*  PHOS 3.2 3.3 2.7   Recent Labs    10/27/17 0536 10/28/17 0554 10/29/17 0530  AST 13* 12* 14*  ALT 9* 8* 9*  ALKPHOS 70 66 67  BILITOT 0.4 0.5 0.4  PROT 6.1* 6.0* 6.3*  ALBUMIN 2.2* 2.1* 2.3*   Recent Labs    10/27/17 0536 10/28/17 0554 10/29/17 0530  WBC 8.9 9.2 12.0*  NEUTROABS 6.3 6.0 7.5  HGB 9.4* 8.8* 9.0*  HCT 29.9* 28.1* 29.1*  MCV 81.3 81.9 81.1  PLT 701* 610* 677*   Lab Results  Component Value Date   TSH 0.91 12/12/2017   Lab Results  Component Value Date   HGBA1C 6.9 (H) 10/25/2017    Assessment/Plan  1. Multiple sclerosis ([place]) - follows up with neurology, continue Tecfidera DR 240 mg BID, Prednisone and Baclofen   2. Neurogenic bladder - continue Tolterodine ER 4 mg 1 capsule daily, follow up with urology   3. Pressure injury of sacral region,  stage 4 (HCC) - continue treatment daily, monitor for infection, turn to sides when in bed   4. Hyperthyroidism -  Free T4 1.39, within normal, continue Methimazole 5 mg daily, follows up with endocrinology Lab Results  Component Value Date   TSH 0.91 12/12/2017     5. Type 2 diabetes mellitus with diabetic neuropathy, with long-term current use of insulin (HCC) - well-controlled, continue Lantus 100 units/ml inject 20 units SQ Q HS and Novolog 100 units/ml sliding scale TID, and Gabapentin 300 mg 1 capsule BID ophthalmology and podiatry consult for routine check, check for urine microalbumin Lab Results  Component Value Date   HGBA1C 6.9 (H) 10/25/2017    6. Colon cancer screening - stool occult blood X 3    Family/ staff  Communication: Discussed plan of care with resident.  Labs/tests ordered:  Urine microalbumin and stool occult blood X 3  Goals of care:   Long-term care.   Durenda Age, NP [facility] [phone] (Monday-Friday 8:00 a.m. - 5:00 p.m.) [phone] (after hours)

## 2018-01-13 DIAGNOSIS — Z79899 Other long term (current) drug therapy: Secondary | ICD-10-CM | POA: Diagnosis not present

## 2018-01-13 LAB — MICROALBUMIN, URINE: MICROALB UR: 1.9

## 2018-01-13 NOTE — Telephone Encounter (Signed)
[facility] They did received rx tecfidera sent in yesterday but when they rain the claim they got a rejection stating to bill pt primary medicare part D. She is going to send request to resolution team to verify her insurance. They will call me back with an update. I gave my name and [place] office number

## 2018-01-14 ENCOUNTER — Ambulatory Visit (INDEPENDENT_AMBULATORY_CARE_PROVIDER_SITE_OTHER): Payer: Medicare Other | Admitting: Endocrinology

## 2018-01-14 ENCOUNTER — Encounter: Payer: Self-pay | Admitting: Endocrinology

## 2018-01-14 VITALS — BP 148/98 | HR 110 | Ht 62.0 in

## 2018-01-14 DIAGNOSIS — R7989 Other specified abnormal findings of blood chemistry: Secondary | ICD-10-CM

## 2018-01-14 DIAGNOSIS — E0590 Thyrotoxicosis, unspecified without thyrotoxic crisis or storm: Secondary | ICD-10-CM | POA: Diagnosis not present

## 2018-01-14 LAB — T4, FREE: FREE T4: 1.01 ng/dL (ref 0.60–1.60)

## 2018-01-14 LAB — TSH: TSH: 1.57 u[IU]/mL (ref 0.35–4.50)

## 2018-01-14 NOTE — Progress Notes (Signed)
Subjective:    Patient ID: Laura Roberts, female    DOB: 1941-01-16, 77 y.o.   MRN: [id number]  HPI Pt returns for f/u of hyperthyroidism (dx'ed 2014; she lives at [facility], so she cannot be isolated for RAI; [place] (2012) showed multiple nodules scattered throughout the right lobe of no more than 13 mm in diameter).  Since on the tapazole, she feels no different.  Main symptom is bladder spasm.   Past Medical History:  Diagnosis Date  . Degenerative arthritis   . Diabetes mellitus without complication ([place])   . Gait disturbance   . Hypertension   . Multiple sclerosis ([place])   . Neuromuscular disorder (HCC)    Bilateral hand carpal tunnel syndrome  . Obesity   . Spinal stenosis in cervical region   . Spinal stenosis of lumbosacral region   . Spinal stenosis, thoracic     Past Surgical History:  Procedure Laterality Date  . ABDOMINAL HYSTERECTOMY    . BACK SURGERY    . CATARACT EXTRACTION Right   . CHOLECYSTECTOMY    . FEMUR IM NAIL Right 06/02/2014   Procedure: INTRAMEDULLARY (IM) RETROGRADE FEMORAL NAILING;  Surgeon: Johnny Bridge, MD;  Location: [facility];  Service: Orthopedics;  Laterality: Right;  . GROIN DISSECTION Left 10/24/2017   Procedure: IRRIGATION AND DEBRIDEMENT, LEFT THIGH ABCESS ;  Surgeon: Alphonsa Overall, MD;  [facility];  Service: General;  Laterality: Left;  . IR CATHETER TUBE CHANGE  10/13/2017  . left hand carpal tunnel surgery    . REPLACEMENT TOTAL KNEE BILATERAL  2004    Social History   Socioeconomic History  . Marital status: Widowed    Spouse name: Not on file  . Number of children: 2  . Years of education: 61yr colleg  . Highest education level: Not on file  Occupational History  . Occupation: N/A    Employer: [employer]    Comment: NIGHT RESIDENT [employer]  Social Needs  . Financial resource strain: Not hard at all  . Food insecurity:    Worry: Never true    Inability: Never true  . Transportation needs:    Medical: No   Non-medical: No  Tobacco Use  . Smoking status: Never Smoker  . Smokeless tobacco: Never Used  Substance and Sexual Activity  . Alcohol use: No  . Drug use: No  . Sexual activity: Never  Lifestyle  . Physical activity:    Days per week: 7 days    Minutes per session: 30 min  . Stress: Rather much  Relationships  . Social connections:    Talks on phone: More than three times a week    Gets together: More than three times a week    Attends religious service: Never    Active member of club or organization: No    Attends meetings of clubs or organizations: Never    Relationship status: Widowed  . Intimate partner violence:    Fear of current or ex partner: No    Emotionally abused: No    Physically abused: No    Forced sexual activity: No  Other Topics Concern  . Not on file  Social History Narrative   Patient is widowed with 2 children   Patient is right handed   Patient has 2 yrs of college   Patient drinks 2-3 cups of tea daily    Current Outpatient Medications on File Prior to Visit  Medication Sig Dispense Refill  . acetaminophen (TYLENOL) 325  MG tablet Take 650 mg by mouth every 6 (six) hours as needed.     . Amino Acids-Protein Hydrolys (FEEDING SUPPLEMENT, PRO-STAT SUGAR FREE 64,) LIQD Take 30 mLs by mouth 2 (two) times daily. 900 mL 0  . ARTIFICIAL TEAR OP Apply 1 drop to eye 4 (four) times daily as needed (dry eyes).     . baclofen (LIORESAL) 10 MG tablet Take 10-20 mg by mouth See admin instructions. Give 1-1/2 tablets to = 15 mg QAM, 1 tablet to = 10 mg midday, take 2 tablets to = 20 mg qhs    . bisacodyl (DULCOLAX) 10 MG suppository Place 10 mg rectally daily as needed for moderate constipation.    . calcium citrate (CALCITRATE - DOSED IN MG ELEMENTAL CALCIUM) 950 MG tablet Take 200 mg of elemental calcium by mouth daily.    . cholecalciferol (VITAMIN D) 1000 UNITS tablet Take 1,000 Units by mouth daily.    . Dimethyl Fumarate (TECFIDERA) 240 MG CPDR Take 1  capsule (240 mg total) by mouth 2 (two) times daily. 60 capsule 11  . DULoxetine (CYMBALTA) 30 MG capsule Take 90 mg by mouth daily.     Marland Kitchen gabapentin (NEURONTIN) 300 MG capsule Take 1 capsule (300 mg total) by mouth 2 (two) times daily. 60 capsule 0  . insulin aspart (NOVOLOG FLEXPEN) 100 UNIT/ML FlexPen Inject 5-10 Units into the skin 3 (three) times daily before meals. If CBG 200-300 give 5 units, 300-400 give 10 units, greater than 400 call provider    . insulin glargine (LANTUS) 100 UNIT/ML injection Inject 0.2 mLs (20 Units total) into the skin at bedtime. 10 mL 11  . magnesium hydroxide (MILK OF MAGNESIA) 400 MG/5ML suspension Take 30 mLs by mouth daily as needed for mild constipation.    . methimazole (TAPAZOLE) 5 MG tablet Take 1 tablet (5 mg total) by mouth daily. 30 tablet 5  . Multiple Vitamin (MULTIVITAMIN) tablet Take 1 tablet by mouth daily.    . nutrition supplement, JUVEN, (JUVEN) PACK Take 1 packet by mouth daily.  0  . NUTRITIONAL SUPPLEMENT LIQD Take 120 mLs by mouth 2 (two) times daily. NSA MedPass    . pantoprazole (PROTONIX) 40 MG tablet Take 40 mg by mouth daily.    . polyethylene glycol (MIRALAX / GLYCOLAX) packet Take 17 g by mouth daily as needed for mild constipation.     . predniSONE (DELTASONE) 5 MG tablet 1 tablet on odd days, one half tablet on even days    . senna-docusate (SENOKOT-S) 8.6-50 MG tablet Take 2 tablets by mouth at bedtime.    . Skin Protectants, Misc. (EUCERIN) cream Apply 1 application topically 2 (two) times daily.    Marland Kitchen tolterodine (DETROL LA) 4 MG 24 hr capsule Take 4 mg by mouth daily.      No current facility-administered medications on file prior to visit.     Allergies  Allergen Reactions  . Codeine Other (See Comments)    Difficult breathing and skin problem  . Ultram [Tramadol] Other (See Comments)    Difficult breathing and skin peeling  . Januvia [Sitagliptin] Rash    Blisters    Family History  Problem Relation Age of Onset  .  Multiple sclerosis Other        neices.   . Cancer Mother   . Diabetes Mother   . GI Bleed Sister        diverticulitis  . Thyroid disease Neg Hx     BP (!) 148/98 (  BP Location: Left Arm, Patient Position: Sitting, Cuff Size: Normal)   Pulse (!) 110   Ht 5\' 2"  (1.575 m)   SpO2 98%   BMI 33.80 kg/m   Review of Systems Pt says upper body strength is good.  Denies fever.      Objective:   Physical Exam VITAL SIGNS:  See vs page GENERAL: no distress NECK: There is no palpable thyroid enlargement.  No thyroid nodule is palpable.  No palpable lymphadenopathy at the anterior neck.  outside test results are reviewed: TSH and free T4 are normal (12/12/17)     Assessment & Plan:  Hyperthyroidism: well-controlled.  MS: this complicates the interpretation of sxs.    Patient Instructions  Your blood pressure is high today.  Please see your primary care provider soon, to have it rechecked. Please continue the same methimazole. If ever you have fever while taking methimazole, stop it and call us, even if the reason is obvious, because of the risk of a rare side-effect. Please come back for a follow-up appointment in 3 months.

## 2018-01-14 NOTE — Patient Instructions (Addendum)
Your blood pressure is high today.  Please see your primary care provider soon, to have it rechecked. Please continue the same methimazole. If ever you have fever while taking methimazole, stop it and call us, even if the reason is obvious, because of the risk of a rare side-effect. Please come back for a follow-up appointment in 3 months.

## 2018-01-15 NOTE — Telephone Encounter (Signed)
I spoke to pt.  They have not received shipment of of tecfidera at [facility].  I spoke to [place], Education officer, museum at [facility] [phone].  She is to fax copy of the Part D supplement card to [place].  [facility] of [facility] MCR RX.  Copy to front desk to be scanned in.  CoverMyMeds initiated  [phone] (urgent request).  Updated Emma,RN.  Awaiting clinical questions.

## 2018-01-15 NOTE — Telephone Encounter (Addendum)
I will not be in the office tomorrow. Cyril Mourning, RN will watch for approval and notify nursing home if approved.  Rx cx at [facility]. Spoke with Venora Maples there.

## 2018-01-15 NOTE — Telephone Encounter (Signed)
[facility] [phone] spoke to [place].  They have no MCR part D information on the pt.  They have orders for tecfidera. I verified phone and address for pt, and they have not heard from pt.  I LMVM for pt to return call needing this information so can continue the process.

## 2018-01-15 NOTE — Telephone Encounter (Signed)
I called son of pt.  Pt is now at [facility].  He states that her insurance has not changed but that [facility] whom she was with previously was facilitating her MS medication and now that she is at [facility] (there is some issue with the transfer).  He was going to call her and have her return call.

## 2018-01-15 NOTE — Telephone Encounter (Signed)
Pt has returned the call to RN Sandy, she is asking for a call back °

## 2018-01-16 MED ORDER — DIMETHYL FUMARATE 240 MG PO CPDR: 240.0000 mg | DELAYED_RELEASE_CAPSULE | Freq: Two times a day (BID) | ORAL | 11 refills | Status: DC

## 2018-01-16 NOTE — Telephone Encounter (Signed)
I called pt. I advised her that her tecfidera has been approved through [facility]. Pt asked me to send the RX for tecfidera to [facility], and [facility] will mail it to her. RX sent to [facility].  I also called Tillie Rung, Education officer, museum at [facility] to discuss. No answer, left a message asking her to call me back.

## 2018-01-16 NOTE — Telephone Encounter (Signed)
The clinical questions for this PA were not completed yesterday. I completed clinical questions, submitted PA via covermymeds to [facility].  Key: [id number]. Should have a determination in 1-3 business days.

## 2018-01-16 NOTE — Addendum Note (Signed)
Addended by: Lester Ansonia A on: 01/16/2018 09:49 AM   Modules accepted: Orders

## 2018-01-16 NOTE — Telephone Encounter (Signed)
Received this notification from [facility]: "CaseId:[id number];Status:Approved;Review Type:Prior Auth;Coverage Start Date:12/17/2017;Coverage End Date:01/16/2019;"

## 2018-01-18 DIAGNOSIS — R05 Cough: Secondary | ICD-10-CM | POA: Diagnosis not present

## 2018-01-19 DIAGNOSIS — L89613 Pressure ulcer of right heel, stage 3: Secondary | ICD-10-CM | POA: Diagnosis not present

## 2018-01-19 DIAGNOSIS — D649 Anemia, unspecified: Secondary | ICD-10-CM | POA: Diagnosis not present

## 2018-01-19 DIAGNOSIS — N390 Urinary tract infection, site not specified: Secondary | ICD-10-CM | POA: Diagnosis not present

## 2018-01-19 DIAGNOSIS — L89152 Pressure ulcer of sacral region, stage 2: Secondary | ICD-10-CM | POA: Diagnosis not present

## 2018-01-19 DIAGNOSIS — L89214 Pressure ulcer of right hip, stage 4: Secondary | ICD-10-CM | POA: Diagnosis not present

## 2018-01-19 LAB — BASIC METABOLIC PANEL
BUN: 16 (ref 4–21)
CREATININE: 0.5 (ref 0.5–1.1)
Glucose: 238
Potassium: 3.7 (ref 3.4–5.3)
Sodium: 135 — AB (ref 137–147)

## 2018-01-19 LAB — CBC AND DIFFERENTIAL
HCT: 34 — AB (ref 36–46)
HEMOGLOBIN: 10.5 — AB (ref 12.0–16.0)
NEUTROS ABS: 8
PLATELETS: 244 (ref 150–399)
WBC: 11

## 2018-01-19 NOTE — Telephone Encounter (Signed)
Called, LVM for Laura Roberts (Education officer, museum at nursing facility) letting her know Tecfidera approved via pt new insurance and rx sent to [facility]. Asked her to call back if she has further questions or concerns.

## 2018-01-20 ENCOUNTER — Other Ambulatory Visit: Payer: Self-pay | Admitting: Urology

## 2018-01-20 ENCOUNTER — Encounter: Payer: Self-pay | Admitting: Adult Health

## 2018-01-20 ENCOUNTER — Non-Acute Institutional Stay (SKILLED_NURSING_FACILITY): Payer: Medicare Other | Admitting: Adult Health

## 2018-01-20 DIAGNOSIS — R509 Fever, unspecified: Secondary | ICD-10-CM

## 2018-01-20 DIAGNOSIS — R000 Tachycardia, unspecified: Secondary | ICD-10-CM

## 2018-01-20 DIAGNOSIS — H1033 Unspecified acute conjunctivitis, bilateral: Secondary | ICD-10-CM

## 2018-01-20 DIAGNOSIS — N390 Urinary tract infection, site not specified: Secondary | ICD-10-CM | POA: Diagnosis not present

## 2018-01-20 DIAGNOSIS — Z79899 Other long term (current) drug therapy: Secondary | ICD-10-CM | POA: Diagnosis not present

## 2018-01-20 DIAGNOSIS — R05 Cough: Secondary | ICD-10-CM

## 2018-01-20 DIAGNOSIS — R059 Cough, unspecified: Secondary | ICD-10-CM

## 2018-01-20 DIAGNOSIS — G35 Multiple sclerosis: Secondary | ICD-10-CM

## 2018-01-20 DIAGNOSIS — R319 Hematuria, unspecified: Secondary | ICD-10-CM | POA: Diagnosis not present

## 2018-01-20 NOTE — Progress Notes (Signed)
Location:  [place] Room Number: 202-A Place of Service:  SNF (31) Provider:  Durenda Age, NP  No care team member to display  Extended Emergency Contact Information Primary Emergency Contact: Dennard,Leroy III          [place], [place] Phone: [phone] Relation: Son Secondary Emergency Contact: Salli [place] Phone: [phone] Mobile Phone: [phone] Relation: Son  Code Status:  DNR  Goals of care: Advanced Directive information Advanced Directives 01/12/2018  Does Patient Have a Medical Advance Directive? Yes  Type of Advance Directive Out of facility DNR (pink MOST or yellow form)  Does patient want to make changes to medical advance directive? No - Patient declined  Copy of [place] in Chart? No - copy requested  Would patient like information on creating a medical advance directive? -  Pre-existing out of facility DNR order (yellow form or pink MOST form) -     Chief Complaint  Patient presents with  . Acute Visit    Patient with fever, red/puffy eyes and nurse states she doesn't look well.      HPI:  Pt is a 77 y.o. female seen today for an acute visit due to a fever (T101) , rapid pulse (HR130), red/puffy face, and nurse states she doesn't look well.  She is a long-term care resident of [facility].  She has a PMH of multiple sclerosis, IDDM2, HTN, spinal stenosis, neurogenic bladder with suprapubic catheter, decubitus ulcers, and complicated UTIs. She was seen in the room today. She verbalized feeling better compared to yesterday. She was noted to be coughing productively with whitish phlegm. Chest x-ray was negative for pneumonia. She was recently started on Erythromycin ophthalmic ointment for bilateral conjunctivitis. Sclerae are reddish with whitish yellowish discharge. She is verbally responsive and denies any pain. She reported  eating with good appetite. She has suprapubic catheter with clear yellowish urine. Awaiting on her urine culture result.     Past Medical History:  Diagnosis Date  . Degenerative arthritis   . Diabetes mellitus without complication ([place])   . Gait disturbance   . Hypertension   . Multiple sclerosis ([place])   . Neuromuscular disorder ([facility])    Bilateral hand carpal tunnel syndrome  . Obesity   . Spinal stenosis in cervical region   . Spinal stenosis of lumbosacral region   . Spinal stenosis, thoracic    Past Surgical History:  Procedure Laterality Date  . ABDOMINAL HYSTERECTOMY    . BACK SURGERY    . CATARACT EXTRACTION Right   . CHOLECYSTECTOMY    . FEMUR IM NAIL Right 06/02/2014   Procedure: INTRAMEDULLARY (IM) RETROGRADE FEMORAL NAILING;  Surgeon: Johnny Bridge, MD;  Location: [facility];  Service: Orthopedics;  Laterality: Right;  . GROIN DISSECTION Left 10/24/2017   Procedure: IRRIGATION AND DEBRIDEMENT, LEFT THIGH ABCESS ;  Surgeon: Alphonsa Overall, MD;  Location: [facility];  Service: General;  Laterality: Left;  . IR CATHETER TUBE CHANGE  10/13/2017  . left hand carpal tunnel surgery    . REPLACEMENT TOTAL KNEE BILATERAL  2004    Allergies  Allergen Reactions  . Codeine Other (See Comments)    Difficult breathing and skin problem  . Ultram [Tramadol] Other (See Comments)    Difficult breathing and skin peeling  . Januvia [Sitagliptin] Rash    Blisters    Outpatient Encounter Medications as of 01/20/2018  Medication Sig  . acetaminophen (TYLENOL)  325 MG tablet Take 650 mg by mouth every 6 (six) hours as needed.   . Amino Acids-Protein Hydrolys (FEEDING SUPPLEMENT, PRO-STAT SUGAR FREE 64,) LIQD Take 30 mLs by mouth 2 (two) times daily.  . ARTIFICIAL TEAR OP Apply 1 drop to eye 4 (four) times daily as needed (dry eyes).   . baclofen (LIORESAL) 10 MG tablet Take 10-20 mg by mouth See admin instructions. Give 1-1/2 tablets to = 15 mg QAM, 1 tablet to = 10 mg midday, take 2 tablets to  = 20 mg qhs  . bisacodyl (DULCOLAX) 10 MG suppository Place 10 mg rectally daily as needed for moderate constipation.  . calcium citrate (CALCITRATE - DOSED IN MG ELEMENTAL CALCIUM) 950 MG tablet Take 200 mg of elemental calcium by mouth daily.  . cholecalciferol (VITAMIN D) 1000 UNITS tablet Take 1,000 Units by mouth daily.  . Dimethyl Fumarate (TECFIDERA) 240 MG CPDR Take 1 capsule (240 mg total) by mouth 2 (two) times daily.  . DULoxetine (CYMBALTA) 30 MG capsule Take 90 mg by mouth daily.   Marland Kitchen erythromycin ophthalmic ointment Place 1 application into both eyes 5 (five) times daily. Instill 1 cm into both eyes 4 times daily x7 days  . gabapentin (NEURONTIN) 300 MG capsule Take 1 capsule (300 mg total) by mouth 2 (two) times daily.  . insulin glargine (LANTUS) 100 UNIT/ML injection Inject 0.2 mLs (20 Units total) into the skin at bedtime.  . insulin lispro (HUMALOG) 100 UNIT/ML injection Inject 5-10 Units into the skin 3 (three) times daily before meals. If CBG 200-300 = 5 units, 300-400 = 10 units, greater than 400 call provider  . magnesium hydroxide (MILK OF MAGNESIA) 400 MG/5ML suspension Take 30 mLs by mouth daily as needed for mild constipation.  . methimazole (TAPAZOLE) 5 MG tablet Take 1 tablet (5 mg total) by mouth daily.  . Multiple Vitamin (MULTIVITAMIN) tablet Take 1 tablet by mouth daily.  . nutrition supplement, JUVEN, (JUVEN) PACK Take 1 packet by mouth daily.  Marland Kitchen NUTRITIONAL SUPPLEMENT LIQD Take 120 mLs by mouth 2 (two) times daily. NSA MedPass  . pantoprazole (PROTONIX) 40 MG tablet Take 40 mg by mouth daily.  . polyethylene glycol (MIRALAX / GLYCOLAX) packet Take 17 g by mouth daily as needed for mild constipation.   . predniSONE (DELTASONE) 5 MG tablet 1 tablet on odd days, one half tablet on even days  . senna-docusate (SENOKOT-S) 8.6-50 MG tablet Take 2 tablets by mouth at bedtime.  . Skin Protectants, Misc. (EUCERIN) cream Apply 1 application topically 2 (two) times daily.    Marland Kitchen tolterodine (DETROL LA) 4 MG 24 hr capsule Take 4 mg by mouth daily.    No facility-administered encounter medications on file as of 01/20/2018.     Review of Systems  GENERAL: +fever MOUTH and THROAT: Denies oral discomfort, gingival pain or bleeding RESPIRATORY: +cough, no SOB CARDIAC: No chest pain, edema or palpitations GI: No abdominal pain, diarrhea, constipation, heart burn, nausea or vomiting PSYCHIATRIC: Denies feelings of depression or anxiety. No report of hallucinations, insomnia, paranoia, or agitation   Immunization History  Administered Date(s) Administered  . Influenza-Unspecified 04/28/2017  . Pneumococcal-Unspecified 03/31/2016, 11/18/2017  . Tdap 11/13/2017   Pertinent  Health Maintenance Due  Topic Date Due  . FOOT EXAM  11/05/1950  . OPHTHALMOLOGY EXAM  11/05/1950  . COLONOSCOPY  11/05/1992  . INFLUENZA VACCINE  01/29/2018  . HEMOGLOBIN A1C  04/26/2018  . PNA vac Low Risk Adult (2 of 2 - PCV13) 11/19/2018  .  URINE MICROALBUMIN  01/14/2019  . DEXA SCAN  Discontinued   Fall Risk  11/13/2017 10/14/2017  Falls in the past year? No No      Vitals:   01/20/18 1547  BP: 128/71  Pulse: (!) 130  Resp: 20  Temp: (!) 101.3 F (38.5 C)  TempSrc: Oral  SpO2: 91%  Weight: 184 lb 12.8 oz (83.8 kg)  Height: 5\' 2"  (1.575 m)   Body mass index is 33.8 kg/m.  Physical Exam  GENERAL APPEARANCE: Well nourished. Obese SKIN:  Right buttock pressure ulcer with dressing EYES:  Bilateras sclerae are reddish, eyes with yellowish/whitish mucoid discharge, mild MOUTH and THROAT: Lips are without lesions. Oral mucosa is moist and without lesions. Tongue is normal in shape, size, and color and without lesions RESPIRATORY: Breathing is even & unlabored, BS CTAB CARDIAC: tachycardic, no murmur,no extra heart sounds, no edema GI: Abdomen soft, normal BS, no masses, no tenderness, +suprapubic catheter NEUROLOGICAL:  Unable to move BLE PSYCHIATRIC: Alert and oriented X  3. Affect and behavior are appropriate  Labs reviewed: Recent Labs    10/27/17 0536 10/28/17 0554 10/29/17 0530 01/19/18  NA 140 142 141 135*  K 3.7 3.6 3.2* 3.7  CL 97* 97* 94*  --   CO2 33* 34* 33*  --   GLUCOSE 96 142* 92  --   BUN 13 12 12 16   CREATININE 0.54 0.49 0.47 0.5  CALCIUM 9.1 8.9 9.0  --   MG 1.7 1.5* 1.5*  --   PHOS 3.2 3.3 2.7  --    Recent Labs    10/27/17 0536 10/28/17 0554 10/29/17 0530  AST 13* 12* 14*  ALT 9* 8* 9*  ALKPHOS 70 66 67  BILITOT 0.4 0.5 0.4  PROT 6.1* 6.0* 6.3*  ALBUMIN 2.2* 2.1* 2.3*   Recent Labs    10/27/17 0536 10/28/17 0554 10/29/17 0530 01/19/18  WBC 8.9 9.2 12.0* 11.0  NEUTROABS 6.3 6.0 7.5 8  HGB 9.4* 8.8* 9.0* 10.5*  HCT 29.9* 28.1* 29.1* 34*  MCV 81.3 81.9 81.1  --   PLT 701* 610* 677* 244   Lab Results  Component Value Date   TSH 1.57 01/14/2018   Lab Results  Component Value Date   HGBA1C 6.9 (H) 10/25/2017    Assessment/Plan  1. Tachycardia - patient verbalized that she tends to go above 100s due to her MS medications, she has Temp 101, awaiting urinalysis w/ culture and sensitivity report, repeat HR 118, which is going  down   2. Fever, unspecified fever cause - recent chest x-ray was negative for pneumonia, awaiting urinalysis with CS report, continue Acetaminophen 650 mg Q 6 hours PRN   3. Multiple sclerosis ([place]) - continue Baclofen 10 mg 1 1/2 tab Q AM, 10 mg mid day, 20 mg Q HS, Tecfidera 240 mg BID, Prednisone 5 mg Q other days on odd days and 2.5 mg Q other days on even days   4. Cough - will start Robafen 200 mg/10 ml give 10 ml Q 6AM, 12 PM, 4PM and 10 PM X 1 week   5. Acute conjunctivitis of both eyes, unspecified acute conjunctivitis type - continue Erythromycin opthlamic ointment 5X/day on both eyes for a total of 7 days, keep eyes clean      Family/ staff Communication: Discussed plan of care with resident.  Labs/tests ordered:  None  Goals of care:   Long-term care.   Durenda Age, NP [facility] [phone] (Monday-Friday  8:00 a.m. - 5:00 p.m.) [phone] (after hours)

## 2018-01-21 ENCOUNTER — Encounter (HOSPITAL_COMMUNITY): Payer: Self-pay | Admitting: *Deleted

## 2018-01-26 NOTE — Progress Notes (Signed)
Preop instructions for:    Laura Roberts      Date of Procedure: 01/30/2018        Doctor: Dr Link Snuffer  Time to arrive at [facility]: 1100 Report to: Admitting  Procedure: cystoscopy, botox  Any procedure time changes, MD office will notify you!   Do not eat  past midnight the night before your procedure.(To include any tube feedings-must be discontinued) May have clear liquids  from 12 midnite until 0800am morning of procedure then nothing by mouth.     CLEAR LIQUID DIET   Foods Allowed                                                                     Foods Excluded  Coffee and tea, regular and decaf                             liquids that you cannot  Plain Jell-O in any flavor                                             see through such as: Fruit ices (not with fruit pulp)                                     milk, soups, orange juice  Iced Popsicles                                    All solid food Carbonated beverages, regular and diet                                    Cranberry, grape and apple juices Sports drinks like Gatorade Lightly seasoned clear broth or consume(fat free) Sugar, honey syrup  Sample Menu Breakfast                                Lunch                                     Supper Cranberry juice                    Beef broth                            Chicken broth Jell-O                                     Grape juice  Apple juice Coffee or tea                        Jell-O                                      Popsicle                                                Coffee or tea                        Coffee or tea  _____________________________________________________________________     Take these morning medications only with sips of water.(or give through gastrostomy or feeding tube). Pantoprazole, baclofen, Duloxetine, Tolterodine, Artificial tears, Erythromycine eye ointment, Gabapentin, Tecfidera  DR  Note: No Insulin or Diabetic meds should be given or taken the morning of the procedure! Take 1/2 of evening dose of Lantus Insulin nite before surgery.    Facility contact: [facility]                   Phone: [place]:  Transportation contact phone#: [facility]  Please send day of procedure:current med list and meds last taken that day, confirm nothing by mouth status from what time, Patient Demographic info( to include DNR status, problem list, allergies)   RN contact name/phone#: [facility] Phone [phone]                             and Fax [phone]  [place] card and picture ID Leave all jewelry and other valuables at place where living( no metal or rings to be worn) No contact lens Women-no make-up, no lotions,perfumes,powders   Any questions day of procedure,call [phone]    Sent from :[facility]                   [place]                   Fax:[phone]  Sent by :Gillian Shields RN

## 2018-01-27 ENCOUNTER — Encounter (HOSPITAL_COMMUNITY): Payer: Self-pay | Admitting: *Deleted

## 2018-01-30 ENCOUNTER — Ambulatory Visit (HOSPITAL_COMMUNITY): Payer: Medicare Other | Admitting: Anesthesiology

## 2018-01-30 ENCOUNTER — Observation Stay (HOSPITAL_COMMUNITY)
Admission: RE | Admit: 2018-01-30 | Discharge: 2018-01-31 | Disposition: A | Discharge status: Skilled Nursing Facility | Payer: Medicare Other | Source: Ambulatory Visit | Attending: Urology | Admitting: Urology

## 2018-01-30 ENCOUNTER — Other Ambulatory Visit: Payer: Self-pay

## 2018-01-30 ENCOUNTER — Encounter (HOSPITAL_COMMUNITY): Payer: Self-pay | Admitting: *Deleted

## 2018-01-30 ENCOUNTER — Encounter (HOSPITAL_COMMUNITY)
Admission: RE | Disposition: A | Discharge status: Skilled Nursing Facility | Payer: Self-pay | Source: Ambulatory Visit | Attending: Urology

## 2018-01-30 DIAGNOSIS — Z466 Encounter for fitting and adjustment of urinary device: Secondary | ICD-10-CM | POA: Diagnosis not present

## 2018-01-30 DIAGNOSIS — F3341 Major depressive disorder, recurrent, in partial remission: Secondary | ICD-10-CM | POA: Insufficient documentation

## 2018-01-30 DIAGNOSIS — Z6833 Body mass index (BMI) 33.0-33.9, adult: Secondary | ICD-10-CM | POA: Insufficient documentation

## 2018-01-30 DIAGNOSIS — K219 Gastro-esophageal reflux disease without esophagitis: Secondary | ICD-10-CM | POA: Insufficient documentation

## 2018-01-30 DIAGNOSIS — Z79899 Other long term (current) drug therapy: Secondary | ICD-10-CM | POA: Insufficient documentation

## 2018-01-30 DIAGNOSIS — Z7401 Bed confinement status: Secondary | ICD-10-CM | POA: Diagnosis not present

## 2018-01-30 DIAGNOSIS — E7800 Pure hypercholesterolemia, unspecified: Secondary | ICD-10-CM | POA: Insufficient documentation

## 2018-01-30 DIAGNOSIS — M2550 Pain in unspecified joint: Secondary | ICD-10-CM | POA: Diagnosis not present

## 2018-01-30 DIAGNOSIS — E785 Hyperlipidemia, unspecified: Secondary | ICD-10-CM | POA: Diagnosis not present

## 2018-01-30 DIAGNOSIS — N368 Other specified disorders of urethra: Secondary | ICD-10-CM | POA: Insufficient documentation

## 2018-01-30 DIAGNOSIS — Z885 Allergy status to narcotic agent status: Secondary | ICD-10-CM | POA: Diagnosis not present

## 2018-01-30 DIAGNOSIS — I10 Essential (primary) hypertension: Secondary | ICD-10-CM | POA: Insufficient documentation

## 2018-01-30 DIAGNOSIS — N319 Neuromuscular dysfunction of bladder, unspecified: Principal | ICD-10-CM | POA: Insufficient documentation

## 2018-01-30 DIAGNOSIS — N3281 Overactive bladder: Secondary | ICD-10-CM | POA: Diagnosis not present

## 2018-01-30 DIAGNOSIS — E119 Type 2 diabetes mellitus without complications: Secondary | ICD-10-CM | POA: Diagnosis not present

## 2018-01-30 DIAGNOSIS — Z794 Long term (current) use of insulin: Secondary | ICD-10-CM | POA: Diagnosis not present

## 2018-01-30 DIAGNOSIS — E669 Obesity, unspecified: Secondary | ICD-10-CM | POA: Diagnosis not present

## 2018-01-30 DIAGNOSIS — R0902 Hypoxemia: Secondary | ICD-10-CM | POA: Diagnosis not present

## 2018-01-30 DIAGNOSIS — N318 Other neuromuscular dysfunction of bladder: Secondary | ICD-10-CM | POA: Diagnosis not present

## 2018-01-30 DIAGNOSIS — T83090A Other mechanical complication of cystostomy catheter, initial encounter: Secondary | ICD-10-CM | POA: Diagnosis not present

## 2018-01-30 DIAGNOSIS — L8990 Pressure ulcer of unspecified site, unspecified stage: Secondary | ICD-10-CM

## 2018-01-30 DIAGNOSIS — Z888 Allergy status to other drugs, medicaments and biological substances status: Secondary | ICD-10-CM | POA: Diagnosis not present

## 2018-01-30 DIAGNOSIS — G35 Multiple sclerosis: Secondary | ICD-10-CM | POA: Insufficient documentation

## 2018-01-30 HISTORY — DX: Age-related osteoporosis without current pathological fracture: M81.0

## 2018-01-30 HISTORY — DX: Other adrenocortical insufficiency: E27.49

## 2018-01-30 HISTORY — DX: Aphasia: R47.01

## 2018-01-30 HISTORY — DX: Polyneuropathy, unspecified: G62.9

## 2018-01-30 HISTORY — DX: Vitamin D deficiency, unspecified: E55.9

## 2018-01-30 HISTORY — DX: Major depressive disorder, single episode, unspecified: F32.9

## 2018-01-30 HISTORY — DX: Personal history of urinary (tract) infections: Z87.440

## 2018-01-30 HISTORY — DX: Gastro-esophageal reflux disease without esophagitis: K21.9

## 2018-01-30 HISTORY — DX: Pressure ulcer of right buttock, stage 4: L89.314

## 2018-01-30 HISTORY — DX: Depression, unspecified: F32.A

## 2018-01-30 HISTORY — PX: BOTOX INJECTION: SHX5754

## 2018-01-30 HISTORY — DX: Unspecified cystostomy status: Z93.50

## 2018-01-30 HISTORY — DX: History of falling: Z91.81

## 2018-01-30 HISTORY — DX: Full incontinence of feces: R15.9

## 2018-01-30 HISTORY — DX: Neuromuscular dysfunction of bladder, unspecified: N31.9

## 2018-01-30 HISTORY — DX: Paraplegia, unspecified: G82.20

## 2018-01-30 HISTORY — DX: Hyperlipidemia, unspecified: E78.5

## 2018-01-30 HISTORY — DX: Constipation, unspecified: K59.00

## 2018-01-30 HISTORY — DX: Nontoxic multinodular goiter: E04.2

## 2018-01-30 LAB — GLUCOSE, CAPILLARY
Glucose-Capillary: 90 mg/dL (ref 70–99)
Glucose-Capillary: 153 mg/dL — ABNORMAL HIGH (ref 70–99)
Glucose-Capillary: 318 mg/dL — ABNORMAL HIGH (ref 70–99)

## 2018-01-30 SURGERY — BOTOX INJECTION
Anesthesia: General

## 2018-01-30 MED ORDER — PREDNISONE 5 MG PO TABS: 2.5000 mg | ORAL_TABLET | ORAL | Status: DC

## 2018-01-30 MED ORDER — FESOTERODINE FUMARATE ER 4 MG PO TB24
4.0000 mg | ORAL_TABLET | Freq: Every day | ORAL | Status: DC
  Administered 2018-01-31: 4 mg via ORAL
  Filled 2018-01-30: qty 1

## 2018-01-30 MED ORDER — METHIMAZOLE 5 MG PO TABS
5.0000 mg | ORAL_TABLET | Freq: Every day | ORAL | Status: DC
  Administered 2018-01-30 – 2018-01-31 (×2): 5 mg via ORAL
  Filled 2018-01-30 (×2): qty 1

## 2018-01-30 MED ORDER — BACLOFEN 10 MG PO TABS: 10.0000 mg | ORAL_TABLET | ORAL | Status: DC

## 2018-01-30 MED ORDER — PROPOFOL 10 MG/ML IV BOLUS
INTRAVENOUS | Status: AC
  Filled 2018-01-30: qty 20

## 2018-01-30 MED ORDER — SODIUM CHLORIDE 0.9 % IR SOLN
Status: DC | PRN
  Administered 2018-01-30: 3000 mL via INTRAVESICAL

## 2018-01-30 MED ORDER — FENTANYL CITRATE (PF) 100 MCG/2ML IJ SOLN
INTRAMUSCULAR | Status: DC | PRN
  Administered 2018-01-30: 100 ug via INTRAVENOUS

## 2018-01-30 MED ORDER — EUCERIN EX CREA: 1.0000 "application " | TOPICAL_CREAM | Freq: Two times a day (BID) | CUTANEOUS | Status: DC

## 2018-01-30 MED ORDER — ALBUMIN HUMAN 5 % IV SOLN
12.5000 g | Freq: Once | INTRAVENOUS | Status: AC
  Administered 2018-01-30: 12.5 g via INTRAVENOUS

## 2018-01-30 MED ORDER — CEFAZOLIN SODIUM-DEXTROSE 2-4 GM/100ML-% IV SOLN
2.0000 g | INTRAVENOUS | Status: AC
  Administered 2018-01-30: 2 g via INTRAVENOUS
  Filled 2018-01-30: qty 100

## 2018-01-30 MED ORDER — BACLOFEN 10 MG PO TABS
15.0000 mg | ORAL_TABLET | Freq: Every day | ORAL | Status: DC
  Administered 2018-01-31: 15 mg via ORAL
  Filled 2018-01-30: qty 2

## 2018-01-30 MED ORDER — SODIUM CHLORIDE 0.9% FLUSH
3.0000 mL | Freq: Two times a day (BID) | INTRAVENOUS | Status: DC
  Administered 2018-01-30: 3 mL via INTRAVENOUS

## 2018-01-30 MED ORDER — ROCURONIUM BROMIDE 10 MG/ML (PF) SYRINGE
PREFILLED_SYRINGE | INTRAVENOUS | Status: DC | PRN
  Administered 2018-01-30: 20 mg via INTRAVENOUS

## 2018-01-30 MED ORDER — ENOXAPARIN SODIUM 40 MG/0.4ML ~~LOC~~ SOLN
40.0000 mg | SUBCUTANEOUS | Status: DC
  Administered 2018-01-30: 40 mg via SUBCUTANEOUS
  Filled 2018-01-30: qty 0.4

## 2018-01-30 MED ORDER — PROPOFOL 10 MG/ML IV BOLUS
INTRAVENOUS | Status: DC | PRN
  Administered 2018-01-30 (×2): 100 mg via INTRAVENOUS

## 2018-01-30 MED ORDER — ROCURONIUM BROMIDE 10 MG/ML (PF) SYRINGE
PREFILLED_SYRINGE | INTRAVENOUS | Status: AC
  Filled 2018-01-30: qty 10

## 2018-01-30 MED ORDER — LIDOCAINE 2% (20 MG/ML) 5 ML SYRINGE
INTRAMUSCULAR | Status: DC | PRN
  Administered 2018-01-30: 50 mg via INTRAVENOUS

## 2018-01-30 MED ORDER — SODIUM CHLORIDE 0.9 % IJ SOLN
INTRAMUSCULAR | Status: AC
  Filled 2018-01-30: qty 50

## 2018-01-30 MED ORDER — HYDROCERIN EX CREA
TOPICAL_CREAM | Freq: Two times a day (BID) | CUTANEOUS | Status: DC
  Administered 2018-01-30 – 2018-01-31 (×2): via TOPICAL
  Filled 2018-01-30: qty 113

## 2018-01-30 MED ORDER — BOOST / RESOURCE BREEZE PO LIQD CUSTOM: 1.0000 | Freq: Three times a day (TID) | ORAL | Status: DC

## 2018-01-30 MED ORDER — DEXAMETHASONE SODIUM PHOSPHATE 10 MG/ML IJ SOLN
INTRAMUSCULAR | Status: DC | PRN
  Administered 2018-01-30: 10 mg via INTRAVENOUS

## 2018-01-30 MED ORDER — ESMOLOL HCL 100 MG/10ML IV SOLN
INTRAVENOUS | Status: DC | PRN
  Administered 2018-01-30: 50 mg via INTRAVENOUS

## 2018-01-30 MED ORDER — SODIUM CHLORIDE 0.9% FLUSH: 3.0000 mL | INTRAVENOUS | Status: DC | PRN

## 2018-01-30 MED ORDER — CALCIUM CITRATE 950 (200 CA) MG PO TABS
200.0000 mg | ORAL_TABLET | Freq: Every day | ORAL | Status: DC
  Administered 2018-01-31: 200 mg via ORAL
  Filled 2018-01-30: qty 1

## 2018-01-30 MED ORDER — FENTANYL CITRATE (PF) 100 MCG/2ML IJ SOLN
INTRAMUSCULAR | Status: AC
  Filled 2018-01-30: qty 2

## 2018-01-30 MED ORDER — ONABOTULINUMTOXINA 100 UNITS IJ SOLR
200.0000 [IU] | Freq: Once | INTRAMUSCULAR | Status: AC
Start: 2018-01-30 — End: 2018-01-30
  Administered 2018-01-30: 100 [IU] via INTRAMUSCULAR
  Filled 2018-01-30: qty 200

## 2018-01-30 MED ORDER — SUGAMMADEX SODIUM 200 MG/2ML IV SOLN
INTRAVENOUS | Status: DC | PRN
  Administered 2018-01-30: 170 mg via INTRAVENOUS

## 2018-01-30 MED ORDER — PREDNISONE 5 MG PO TABS
5.0000 mg | ORAL_TABLET | ORAL | Status: DC
  Administered 2018-01-31: 5 mg via ORAL
  Filled 2018-01-30: qty 1

## 2018-01-30 MED ORDER — FENTANYL CITRATE (PF) 100 MCG/2ML IJ SOLN: 25.0000 ug | INTRAMUSCULAR | Status: DC | PRN

## 2018-01-30 MED ORDER — ACETAMINOPHEN 325 MG PO TABS
650.0000 mg | ORAL_TABLET | ORAL | Status: DC | PRN
  Administered 2018-01-30: 650 mg via ORAL
  Filled 2018-01-30: qty 2

## 2018-01-30 MED ORDER — DEXAMETHASONE SODIUM PHOSPHATE 10 MG/ML IJ SOLN
INTRAMUSCULAR | Status: AC
  Filled 2018-01-30: qty 1

## 2018-01-30 MED ORDER — DULOXETINE HCL 60 MG PO CPEP
90.0000 mg | ORAL_CAPSULE | Freq: Every day | ORAL | Status: DC
  Administered 2018-01-31: 90 mg via ORAL
  Filled 2018-01-30: qty 1

## 2018-01-30 MED ORDER — PHENYLEPHRINE 40 MCG/ML (10ML) SYRINGE FOR IV PUSH (FOR BLOOD PRESSURE SUPPORT)
PREFILLED_SYRINGE | INTRAVENOUS | Status: AC
  Filled 2018-01-30: qty 10

## 2018-01-30 MED ORDER — INSULIN ASPART 100 UNIT/ML ~~LOC~~ SOLN
0.0000 [IU] | SUBCUTANEOUS | Status: DC
  Administered 2018-01-30: 11 [IU] via SUBCUTANEOUS
  Administered 2018-01-31: 5 [IU] via SUBCUTANEOUS
  Administered 2018-01-31: 2 [IU] via SUBCUTANEOUS
  Administered 2018-01-31: 5 [IU] via SUBCUTANEOUS
  Administered 2018-01-31: 15 [IU] via SUBCUTANEOUS

## 2018-01-30 MED ORDER — SUGAMMADEX SODIUM 200 MG/2ML IV SOLN
INTRAVENOUS | Status: AC
  Filled 2018-01-30: qty 2

## 2018-01-30 MED ORDER — PRO-STAT SUGAR FREE PO LIQD
30.0000 mL | Freq: Two times a day (BID) | ORAL | Status: DC
  Administered 2018-01-30 – 2018-01-31 (×2): 30 mL via ORAL
  Filled 2018-01-30 (×2): qty 30

## 2018-01-30 MED ORDER — IPRATROPIUM-ALBUTEROL 0.5-2.5 (3) MG/3ML IN SOLN
3.0000 mL | RESPIRATORY_TRACT | Status: DC
  Administered 2018-01-30: 3 mL via RESPIRATORY_TRACT
  Filled 2018-01-30: qty 3

## 2018-01-30 MED ORDER — BACLOFEN 20 MG PO TABS
20.0000 mg | ORAL_TABLET | Freq: Every day | ORAL | Status: DC
  Administered 2018-01-30: 20 mg via ORAL
  Filled 2018-01-30: qty 1

## 2018-01-30 MED ORDER — GABAPENTIN 300 MG PO CAPS
300.0000 mg | ORAL_CAPSULE | Freq: Two times a day (BID) | ORAL | Status: DC
  Administered 2018-01-30 – 2018-01-31 (×2): 300 mg via ORAL
  Filled 2018-01-30 (×2): qty 1

## 2018-01-30 MED ORDER — ONDANSETRON HCL 4 MG/2ML IJ SOLN
INTRAMUSCULAR | Status: DC | PRN
  Administered 2018-01-30: 4 mg via INTRAVENOUS

## 2018-01-30 MED ORDER — SENNOSIDES-DOCUSATE SODIUM 8.6-50 MG PO TABS
2.0000 | ORAL_TABLET | Freq: Every day | ORAL | Status: DC
  Filled 2018-01-30: qty 2

## 2018-01-30 MED ORDER — ONABOTULINUMTOXINA 100 UNITS IJ SOLR
INTRAMUSCULAR | Status: DC | PRN
  Administered 2018-01-30: 100 [IU] via INTRAMUSCULAR

## 2018-01-30 MED ORDER — ONDANSETRON HCL 4 MG/2ML IJ SOLN: 4.0000 mg | Freq: Once | INTRAMUSCULAR | Status: DC | PRN

## 2018-01-30 MED ORDER — LACTATED RINGERS IV SOLN
INTRAVENOUS | Status: DC
  Administered 2018-01-30 (×2): via INTRAVENOUS

## 2018-01-30 MED ORDER — PHENYLEPHRINE 40 MCG/ML (10ML) SYRINGE FOR IV PUSH (FOR BLOOD PRESSURE SUPPORT)
PREFILLED_SYRINGE | INTRAVENOUS | Status: DC | PRN
  Administered 2018-01-30 (×5): 80 ug via INTRAVENOUS

## 2018-01-30 MED ORDER — BACLOFEN 10 MG PO TABS
10.0000 mg | ORAL_TABLET | ORAL | Status: DC
  Administered 2018-01-31: 10 mg via ORAL
  Filled 2018-01-30: qty 1

## 2018-01-30 MED ORDER — SODIUM CHLORIDE 0.9 % IV SOLN: 250.0000 mL | INTRAVENOUS | Status: DC | PRN

## 2018-01-30 MED ORDER — PANTOPRAZOLE SODIUM 40 MG PO TBEC
40.0000 mg | DELAYED_RELEASE_TABLET | Freq: Every day | ORAL | Status: DC
  Administered 2018-01-31: 40 mg via ORAL
  Filled 2018-01-30: qty 1

## 2018-01-30 MED ORDER — DIMETHYL FUMARATE 240 MG PO CPDR: 240.0000 mg | DELAYED_RELEASE_CAPSULE | Freq: Two times a day (BID) | ORAL | Status: DC

## 2018-01-30 MED ORDER — POLYETHYLENE GLYCOL 3350 17 G PO PACK
17.0000 g | PACK | Freq: Every day | ORAL | Status: DC | PRN
Start: 2018-01-30 — End: 2018-01-31

## 2018-01-30 MED ORDER — ALBUMIN HUMAN 5 % IV SOLN
INTRAVENOUS | Status: AC
  Administered 2018-01-30: 12.5 g via INTRAVENOUS
  Filled 2018-01-30: qty 250

## 2018-01-30 MED ORDER — ONDANSETRON HCL 4 MG/2ML IJ SOLN
INTRAMUSCULAR | Status: AC
  Filled 2018-01-30: qty 2

## 2018-01-30 MED ORDER — VITAMIN D3 25 MCG (1000 UNIT) PO TABS
1000.0000 [IU] | ORAL_TABLET | Freq: Every day | ORAL | Status: DC
  Administered 2018-01-31: 1000 [IU] via ORAL
  Filled 2018-01-30: qty 1

## 2018-01-30 MED ORDER — LIDOCAINE 2% (20 MG/ML) 5 ML SYRINGE
INTRAMUSCULAR | Status: AC
  Filled 2018-01-30: qty 5

## 2018-01-30 SURGICAL SUPPLY — 13 items
BAG URO CATCHER STRL LF (MISCELLANEOUS) ×2 IMPLANT
CATH SILICONE 14FRX5CC (CATHETERS) ×1 IMPLANT
CATH SILICONE 16FRX5CC (CATHETERS) ×1 IMPLANT
CLOTH BEACON ORANGE TIMEOUT ST (SAFETY) ×1 IMPLANT
GLOVE BIO SURGEON STRL SZ7.5 (GLOVE) ×4 IMPLANT
GOWN STRL REUS W/TWL LRG LVL3 (GOWN DISPOSABLE) ×4 IMPLANT
MANIFOLD NEPTUNE II (INSTRUMENTS) ×2 IMPLANT
NDL SAFETY ECLIPSE 18X1.5 (NEEDLE) IMPLANT
NEEDLE HYPO 18GX1.5 SHARP (NEEDLE)
PACK CYSTO (CUSTOM PROCEDURE TRAY) ×2 IMPLANT
SYR CONTROL 10ML LL (SYRINGE) ×1 IMPLANT
TUBING CONNECTING 10 (TUBING) ×1 IMPLANT
WATER STERILE IRR 3000ML UROMA (IV SOLUTION) ×2 IMPLANT

## 2018-01-30 NOTE — Discharge Instructions (Signed)
°  General instructions:     Your recent bladder surgery requires very little post hospital care but some definite precautions.  Despite the fact that no skin incisions were used, the area around the bladder incisions are raw and covered with scabs to promote healing and prevent bleeding. Certain precautions are needed to insure that the scabs are not disturbed over the next 2-4 weeks while the healing proceeds.  Because the raw surface inside your bladder and the irritating effects of urine you may expect frequency of urination and/or urgency (a stronger desire to urinate) and perhaps even getting up at night more often. This will usually resolve or improve slowly over the healing period. You may see some blood in your urine over the first 6 weeks. Do not be alarmed, even if the urine was clear for a while. Get off your feet and drink lots of fluids until clearing occurs. If you start to pass clots or don't improve call us.  Diet:  You may return to your normal diet immediately. Because of the raw surface of your bladder, alcohol, spicy foods, foods high in acid and drinks with caffeine may cause irritation or frequency and should be used in moderation. To keep your urine flowing freely and avoid constipation, drink plenty of fluids during the day (8-10 glasses). Tip: Avoid cranberry juice because it is very acidic.  Activity:  Your physical activity doesn't need to be restricted. However, if you are very active, you may see some blood in the urine. We suggest that you reduce your activity under the circumstances until the bleeding has stopped.  Bowels:  It is important to keep your bowels regular during the postoperative period. Straining with bowel movements can cause bleeding. A bowel movement every other day is reasonable. Use a mild laxative if needed, such as milk of magnesia 2-3 tablespoons, or 2 Dulcolax tablets. Call if you continue to have problems. If you had been taking narcotics for  pain, before, during or after your surgery, you may be constipated. Take a laxative if necessary.    Medication:  You should resume your pre-surgery medications unless told not to. In addition you may be given an antibiotic to prevent or treat infection. Antibiotics are not always necessary. All medication should be taken as prescribed until the bottles are finished unless you are having an unusual reaction to one of the drugs.

## 2018-01-30 NOTE — Op Note (Signed)
Operative Note  Preoperative diagnosis:  1.  Neurogenic bladder with detrusor overactivity  Postoperative diagnosis: 1.  Neurogenic bladder with detrusor overactivity  Procedure(s): 1.  Cystoscopy with intravesical Botox instillation 2.  Suprapubic tube tract dilation with complex suprapubic tube replacement 3.  Pelvic examination under anesthesia  Surgeon: Link Snuffer, MD  Assistants: None  Anesthesia: General  Complications: None immediate  EBL: Minimal  Specimens: 1.  None  Drains/Catheters: 1.  16 French suprapubic catheter  Intraoperative findings: 1.  Very patulous urethra that was wide open, able to digitally insert my finger into the bladder 2.  Cystoscopy revealed a low capacity bladder.  It was contracted.  There was a great deal of catheter edema. 3.  Tight suprapubic tube tract the required dilation in order to upsize the catheter  Indication: 77 year old female with a history of neurogenic bladder and detrusor overactivity with long-standing history of urethral catheter that was switched over to suprapubic tube.  She presents for the previously mentioned operation due to continuous leakage from her urethra despite suprapubic tube  Description of procedure:  The patient was identified and consent was obtained.  The patient was taken to the operating room and placed in the supine position.  The patient was placed under general anesthesia.  Perioperative antibiotics were administered.  The patient was placed in dorsal lithotomy.  Patient was prepped and draped in a standard sterile fashion and a timeout was performed.  A 21 French rigid cystoscope was advanced into the urethra and into the bladder.  Complete cystoscopy was performed with findings noted above.  I then systematically injected 200 units of Botox in 30 different locations.  I then withdrew the scope.  I tried to place a 27 and a 14 French suprapubic catheter but this met resistance.  I therefore carefully  dilated the tract up to 20 [place] followed by easily placing a 16 French suprapubic catheter.  I instilled 10 cc of sterile water into the catheter balloon.  I performed a pelvic exam which revealed a very patulous urethra.  It was wide open.  In fact, I was able to insert my finger into the bladder.  This concluded the operation.  Patient tolerated procedure well and was stable postoperatively.  Plan: Patient will return in 3 to 4 months to see how she is doing with the Botox.  Unfortunately, the patient's urethra is wide open and very patulous.  She may require a bladder neck surgery or sling in order to create resistance at the bladder neck.

## 2018-01-30 NOTE — Transfer of Care (Signed)
Immediate Anesthesia Transfer of Care Note  Patient: Laura Roberts  Procedure(s) Performed: CYSTOSCOPY BOTOX INJECTION, SUPRAPUBIC EXCHANGE (N/A )  Patient Location: PACU  Anesthesia Type:General  Level of Consciousness: awake, alert  and oriented  Airway & Oxygen Therapy: Patient Spontanous Breathing and Patient connected to face mask oxygen  Post-op Assessment: Report given to RN and Post -op Vital signs reviewed and stable  Post vital signs: Reviewed and stable  Last Vitals:  Vitals Value Taken Time  BP 87/66 01/30/2018  3:37 PM  Temp    Pulse 114 01/30/2018  3:40 PM  Resp 19 01/30/2018  3:40 PM  SpO2 100 % 01/30/2018  3:40 PM  Vitals shown include unvalidated device data.  Last Pain:  Vitals:   01/30/18 1220  TempSrc:   PainSc: 0-No pain      Patients Stated Pain Goal: 4 (27/61/84 8592)  Complications: No apparent anesthesia complications

## 2018-01-30 NOTE — Anesthesia Procedure Notes (Signed)
Procedure Name: Intubation Date/Time: 01/30/2018 2:30 PM Performed by: Sharlette Dense, CRNA Patient Re-evaluated:Patient Re-evaluated prior to induction Oxygen Delivery Method: Circle system utilized Preoxygenation: Pre-oxygenation with 100% oxygen Induction Type: IV induction Ventilation: Mask ventilation without difficulty and Oral airway inserted - appropriate to patient size Laryngoscope Size: Sabra Heck and 2 Grade View: Grade I Tube type: Oral Tube size: 7.5 mm Number of attempts: 1 Airway Equipment and Method: Stylet Placement Confirmation: ETT inserted through vocal cords under direct vision,  positive ETCO2 and breath sounds checked- equal and bilateral Secured at: 21 cm Tube secured with: Tape Dental Injury: Teeth and Oropharynx as per pre-operative assessment

## 2018-01-30 NOTE — H&P (Signed)
CC/HPI: cc: Neurogenic bladder  HPI:  10/31/17  A 77 year old female with a history of MS and neurogenic bladder. He recently underwent a suprapubic tube placement on 09/09/2017. She was admitted to the hospital with cellulitis around the suprapubic tube site and was seen by Dr. Junious Silk. She has improved from this. She had a SP tube change on 10/13/2017. This was exchanged for a 14 [place] tube. She presents for follow-up. She was recently hospitalized for a groin abscess that was incised and drained. She is happy now with her suprapubic tube. She has mild leakage around the catheter but not much. Infection has improved.   11/17/2017:  Patient presents today for suprapubic change. She has no other complaints.   12/03/17: Patient presents today with complaints of decreased urinary output from her SP tube site and increased urethral leakage. She states that this has been ongoing for the past week. She is having to wear 2 adult diapers per day, which are typically soaked. She denies any current suprapubic discomfort or abdominal pain. She denies fever or gross hematuria. No nausea or vomiting. She currently uses Detrol for bladder spasms.   12/23/17: She returns today with complaints of increased urinary leakage per her urethra. She remains on Detrol. She was seen in the ED on 6/14 after her SP catheter came out and was not able to be replaced her rehab facility. Per her report, they had a difficult time replaced the catheter and a 12 Fr. SP catheter was eventually placed. Today she states that it intermittently drains well. She states that is draining okay currently, but was not draining well this morning. She notes that when it is not draining, she is having increased leakage per her urethra. She denies fevers or gross hematuria. No suprapubic discomfort.   01/06/2018:  The patient continues with a suprapubic catheter but continues to have significant leakage per urethra. She is interested and entered detrusor  Botox. She has failed oral agents including Detrol.   ALLERGIES: Codeine Januvia Tramadol - Hives   MEDICATIONS: Detrol La 4 mg capsule, ext release 24 hr 1 capsule PO Daily  Omeprazole 20 mg tablet, delayed release  Acetaminophen  Amlodipine Besylate 10 mg tablet  Artificial Tears  Baclofen 10 mg tablet  Calcium Citrate  Cymbalta 30 mg capsule,delayed release  Gabapentin 300 mg capsule  Glimepiride 2 mg tablet  Lantus  Lidoderm  Miralax  Prednisone 5 mg tablet  Senna  Tecfidera 240 mg capsule,delayed release  Tinactin  Vitamin D3    Notes: Detrol LA rx faxed to [facility]   GU PSH: Compl Change SP Tube - 11/17/2017 Simple Change SP Tube - 12/03/2017, 11/17/2017   NON-GU PSH: No Non-GU PSH    GU PMH: Areflexic bladder - 09/16/2017, - 09/03/2017   NON-GU PMH: Hypercholesterolemia Hypertension Major depressive disorder, recurrent, in partial remission Multiple sclerosis Spinal stenosis, thoracic region   FAMILY HISTORY: No Family History    SOCIAL HISTORY: Marital Status: Unknown   REVIEW OF SYSTEMS:    GU Review Female:   Patient reports leakage of urine. Patient denies frequent urination, hard to postpone urination, burning /pain with urination, get up at night to urinate, stream starts and stops, trouble starting your stream, have to strain to urinate, and being pregnant.  Gastrointestinal (Upper):   Patient denies nausea, vomiting, and indigestion/ heartburn.  Gastrointestinal (Lower):   Patient denies diarrhea and constipation.  Constitutional:   Patient denies fever, night sweats, weight loss, and fatigue.  Skin:   Patient denies  skin rash/ lesion and itching.  Eyes:   Patient denies blurred vision and double vision.  Ears/ Nose/ Throat:   Patient denies sore throat and sinus problems.  Hematologic/Lymphatic:   Patient denies swollen glands and easy bruising.  Cardiovascular:   Patient denies leg swelling and chest pains.  Respiratory:   Patient denies cough and shortness  of breath.  Endocrine:   Patient denies excessive thirst.  Musculoskeletal:   Patient denies back pain and joint pain.  Neurological:   Patient denies headaches and dizziness.  Psychologic:   Patient denies depression and anxiety.   VITAL SIGNS:      01/06/2018 08:06 AM  BP 145/83 mmHg  Heart Rate 90 /min  Temperature 97.5 F / 36.3 C   GU PHYSICAL EXAMINATION:    Bladder: Superior pubic catheter in place draining clear yellow urine   MULTI-SYSTEM PHYSICAL EXAMINATION:    Constitutional: Well-nourished. No physical deformities. Normally developed. Good grooming.  Respiratory: No labored breathing, no use of accessory muscles.   Cardiovascular: Normal temperature, adequate perfusion of extremities  Skin: No paleness, no jaundice  Neurologic / Psychiatric: Oriented to time, oriented to place, oriented to person. No depression, no anxiety, no agitation.  Gastrointestinal: No mass, no tenderness, no rigidity, mildly obese abdomen.   Eyes: Normal conjunctivae. Normal eyelids.  Musculoskeletal: Normal gait and station of head and neck.    PAST DATA REVIEWED:  Source Of History:  Patient  Records Review:   Previous Patient Records   PROCEDURES: None   ASSESSMENT:      ICD-10 Details  1 GU:   Unihibited neuropathic bladder - N31.0   2   Detrusor overactivity - N31.1    PLAN:          Schedule Return Visit/Planned Activity: Next Available Appointment - Botox 200mg          Document Letter(s):  Created for Patient: Clinical Summary        Notes:   Proceed with bladder Botox. She understands potential complications including but not limited to bleeding, infection, injury to surrounding structures, need for additional procedures.        Next Appointment:      Next Appointment: 06/30/2018 01:30 PM    Appointment Type: Office Visit NO Urine    Location: [facility] [id number]    Provider: Link Snuffer, III, M.D.    Reason for Visit: 6 mo ck     Signed by Link Snuffer, III, M.D. on 01/06/18 at 8:29 AM (EDT)

## 2018-01-30 NOTE — Anesthesia Postprocedure Evaluation (Signed)
Anesthesia Post Note  Patient: Laura Roberts  Procedure(s) Performed: CYSTOSCOPY BOTOX INJECTION, SUPRAPUBIC EXCHANGE (N/A )     Patient location during evaluation: PACU Anesthesia Type: General Level of consciousness: awake and alert Pain management: pain level controlled Vital Signs Assessment: post-procedure vital signs reviewed and stable Respiratory status: spontaneous breathing, nonlabored ventilation, respiratory function stable and patient connected to nasal cannula oxygen Cardiovascular status: blood pressure returned to baseline, stable and tachycardic Postop Assessment: no apparent nausea or vomiting Anesthetic complications: no Comments: Patient to stay in the hospital overnight for additional oxygen support via nasal cannula.     Last Vitals:  Vitals:   01/30/18 1811 01/30/18 1838  BP:  135/75  Pulse: (!) 115 (!) 118  Resp: 19 19  Temp:    SpO2: 97% 98%    Last Pain:  Vitals:   01/30/18 1800  TempSrc:   PainSc: 0-No pain                 Salome Cozby P Cecil Vandyke

## 2018-01-30 NOTE — Anesthesia Preprocedure Evaluation (Addendum)
Anesthesia Evaluation  Patient identified by MRN, date of birth, ID band Patient awake    Reviewed: Allergy & Precautions, NPO status , Patient's Chart, lab work & pertinent test results  Airway Mallampati: IV  TM Distance: >3 FB Neck ROM: Full    Dental  (+) Missing,    Pulmonary neg pulmonary ROS,    Pulmonary exam normal breath sounds clear to auscultation       Cardiovascular hypertension,  Rhythm:Regular Rate:Tachycardia  ECG: ST, rate 127   Neuro/Psych PSYCHIATRIC DISORDERS Depression Multiple sclerosis  Paraplegia   Neuromuscular disease    GI/Hepatic Neg liver ROS, GERD  Medicated and Controlled,  Endo/Other  diabetes, Insulin Dependent  Renal/GU negative Renal ROS     Musculoskeletal Pressure ulcer of right buttock, stage 4  Spinal stenosis   Abdominal (+) + obese,   Peds  Hematology  (+) anemia , HLD   Anesthesia Other Findings DETRUSOR OVERACTIVITY  Reproductive/Obstetrics                            Anesthesia Physical Anesthesia Plan  ASA: IV  Anesthesia Plan: General   Post-op Pain Management:    Induction: Intravenous  PONV Risk Score and Plan: 3 and Ondansetron and Dexamethasone  Airway Management Planned: Oral ETT  Additional Equipment:   Intra-op Plan:   Post-operative Plan: Extubation in OR  Informed Consent: I have reviewed the patients History and Physical, chart, labs and discussed the procedure including the risks, benefits and alternatives for the proposed anesthesia with the patient or authorized representative who has indicated his/her understanding and acceptance.   Dental advisory given  Plan Discussed with: CRNA  Anesthesia Plan Comments:        Anesthesia Quick Evaluation

## 2018-01-31 DIAGNOSIS — T83090A Other mechanical complication of cystostomy catheter, initial encounter: Secondary | ICD-10-CM | POA: Diagnosis not present

## 2018-01-31 DIAGNOSIS — M2550 Pain in unspecified joint: Secondary | ICD-10-CM | POA: Diagnosis not present

## 2018-01-31 DIAGNOSIS — Z466 Encounter for fitting and adjustment of urinary device: Secondary | ICD-10-CM | POA: Diagnosis not present

## 2018-01-31 DIAGNOSIS — Z7401 Bed confinement status: Secondary | ICD-10-CM | POA: Diagnosis not present

## 2018-01-31 DIAGNOSIS — G35 Multiple sclerosis: Secondary | ICD-10-CM | POA: Diagnosis not present

## 2018-01-31 DIAGNOSIS — N3281 Overactive bladder: Secondary | ICD-10-CM | POA: Diagnosis not present

## 2018-01-31 DIAGNOSIS — L8990 Pressure ulcer of unspecified site, unspecified stage: Secondary | ICD-10-CM

## 2018-01-31 DIAGNOSIS — N368 Other specified disorders of urethra: Secondary | ICD-10-CM | POA: Diagnosis not present

## 2018-01-31 DIAGNOSIS — R0902 Hypoxemia: Secondary | ICD-10-CM | POA: Diagnosis not present

## 2018-01-31 DIAGNOSIS — N319 Neuromuscular dysfunction of bladder, unspecified: Secondary | ICD-10-CM | POA: Diagnosis not present

## 2018-01-31 LAB — GLUCOSE, CAPILLARY
GLUCOSE-CAPILLARY: 100 mg/dL — AB (ref 70–99)
GLUCOSE-CAPILLARY: 219 mg/dL — AB (ref 70–99)
GLUCOSE-CAPILLARY: 380 mg/dL — AB (ref 70–99)
Glucose-Capillary: 147 mg/dL — ABNORMAL HIGH (ref 70–99)
Glucose-Capillary: 249 mg/dL — ABNORMAL HIGH (ref 70–99)

## 2018-01-31 NOTE — Discharge Summary (Signed)
Physician Discharge Summary  Patient ID: Laura Roberts MRN: [id number] DOB/AGE: 08-08-1940 77 y.o.  Admit date: 01/30/2018 Discharge date: 01/31/2018  Admission Diagnoses: Hypoxia  Discharge Diagnoses:  Active Problems:   Hypoxia   Pressure injury of skin   Discharged Condition: good  Hospital Course: The patient tolerated the procedure well and was transferred to the floor on IV pain meds, IV fluid. She was admitted due to hypoxia and inability to wean oxygen.  On POD#1 the oxygen was weaned to off. Prior to discharge the pt was tolerating a regular diet, pain was controlled on PO pain meds, and they had normal bowel function.   Consults: None  Significant Diagnostic Studies: none  Treatments: surgery: intravesical botos  Discharge Exam: Blood pressure 129/77, pulse (!) 104, temperature 97.7 F (36.5 C), temperature source Oral, resp. rate 13, height 5\' 2"  (1.575 m), weight 83.9 kg (184 lb 15.5 oz), SpO2 95 %. General appearance: alert, cooperative and appears stated age Nose: Nares normal. Septum midline. Mucosa normal. No drainage or sinus tenderness. Neck: no adenopathy, no carotid bruit, no JVD, supple, symmetrical, trachea midline and thyroid not enlarged, symmetric, no tenderness/mass/nodules Resp: clear to auscultation bilaterally Cardio: regular rate and rhythm, S1, S2 normal, no murmur, click, rub or gallop GI: soft, non-tender; bowel sounds normal; no masses,  no organomegaly Extremities: extremities normal, atraumatic, no cyanosis or edema Neurologic: Grossly normal  Disposition: Discharge disposition: 03-Skilled Nursing Facility        Allergies as of 01/31/2018      Reactions   Codeine Other (See Comments)   Difficult breathing and skin problem   Ultram [tramadol] Other (See Comments)   Difficult breathing and skin peeling   Januvia [sitagliptin] Rash, Other (See Comments)   Blisters      Medication List    TAKE these medications   acetaminophen  325 MG tablet Commonly known as:  TYLENOL Take 650 mg by mouth every 6 (six) hours as needed for mild pain or fever.   ARTIFICIAL TEAR OP Apply 1 drop to eye 4 (four) times daily as needed (dry eyes).   baclofen 10 MG tablet Commonly known as:  LIORESAL Take 10-20 mg by mouth See admin instructions. Give 1-1/2 tablets to = 15 mg QAM, 1 tablet to = 10 mg midday, take 2 tablets to = 20 mg qhs   calcium citrate 950 MG tablet Commonly known as:  CALCITRATE - dosed in mg elemental calcium Take 200 mg of elemental calcium by mouth daily.   cholecalciferol 1000 units tablet Commonly known as:  VITAMIN D Take 1,000 Units by mouth daily.   Dimethyl Fumarate 240 MG Cpdr Commonly known as:  TECFIDERA Take 1 capsule (240 mg total) by mouth 2 (two) times daily.   DULoxetine 30 MG capsule Commonly known as:  CYMBALTA Take 90 mg by mouth daily.   eucerin cream Apply 1 application topically 2 (two) times daily.   feeding supplement (PRO-STAT SUGAR FREE 64) Liqd Take 30 mLs by mouth 2 (two) times daily.   gabapentin 300 MG capsule Commonly known as:  NEURONTIN Take 1 capsule (300 mg total) by mouth 2 (two) times daily.   HUMALOG 100 UNIT/ML injection Generic drug:  insulin lispro Inject 5-10 Units into the skin 3 (three) times daily before meals. If CBG 200-300 = 5 units, 300-400 = 10 units, greater than 400 call provider   insulin glargine 100 UNIT/ML injection Commonly known as:  LANTUS Inject 0.2 mLs (20 Units total) into the skin  at bedtime.   methimazole 5 MG tablet Commonly known as:  TAPAZOLE Take 1 tablet (5 mg total) by mouth daily.   multivitamin tablet Take 1 tablet by mouth daily.   NUTRITIONAL SUPPLEMENT Liqd Take 120 mLs by mouth 2 (two) times daily. NSA MedPass   nutrition supplement (JUVEN) Pack Take 1 packet by mouth daily.   pantoprazole 40 MG tablet Commonly known as:  PROTONIX Take 40 mg by mouth daily.   polyethylene glycol packet Commonly known as:   MIRALAX / GLYCOLAX Take 17 g by mouth daily as needed for mild constipation.   predniSONE 5 MG tablet Commonly known as:  DELTASONE 1 tablet on odd days, one half tablet on even days What changed:    how much to take  how to take this  when to take this  additional instructions   senna-docusate 8.6-50 MG tablet Commonly known as:  Senokot-S Take 2 tablets by mouth at bedtime.   tolterodine 4 MG 24 hr capsule Commonly known as:  DETROL LA Take 4 mg by mouth daily.      Follow-up Information    Lucas Mallow, MD. Call in 1 month(s).   Specialty:  Urology Contact information: [place] [address] [phone]           Signed: Nicolette Bang 01/31/2018, 11:05 AM

## 2018-01-31 NOTE — Progress Notes (Signed)
CSW following to assist with discharge planning.   Patient admitted under observation status for hypoxia and inability to wean oxygen after having procedure. Patient is a [facility] Lannen term care resident at [facility]. Patient verbalized plan to return to SNF. [facility] staff member Kristeen Miss confirmed patient's ability to return. CSW sent requested clinical documents. [facility] contacted, patient aware and declined for CSW to notify family. Patient's RN can call report to [phone], packet complete. CSW signing off, no other needs identified at this time.  Abundio Miu, [place] Social Worker [facility] Cell#: [phone]

## 2018-01-31 NOTE — Progress Notes (Signed)
Attempted to call report to nurse at [facility] x 2 with no answer.  Left message with receptionist Tresa Moore  that I had attempted to call report and left my name and direct number for the nurse there to call me.

## 2018-01-31 NOTE — NC FL2 (Signed)
[place] LEVEL OF CARE SCREENING TOOL     IDENTIFICATION  Patient Name: Laura Roberts Birthdate: [birth year] Sex: female Admission Date (Current Location): 01/30/2018  [facility] and [place] Number:  Herbalist and Address:  [facility],  [address], [place]      Provider Number: [id number]  Attending Physician Name and Address:  Lucas Mallow, MD  Relative Name and Phone Number:  Jaclene Bartelt III, son: [phone], Teresea Donley, son: [phone]    Current Level of Care: Hospital Recommended Level of Care: [place] Prior Approval Number:    Date Approved/Denied:   PASRR Number: [id number]  Discharge Plan: SNF    Current Diagnoses: Patient Active Problem List   Diagnosis Date Noted  . Pressure injury of skin 01/31/2018  . Hypoxia 01/30/2018  . Hyperthyroidism 01/14/2018  . Abnormal TSH 10/30/2017  . Suprapubic catheter ([place]) 10/29/2017  . Inguinal abscess 10/24/2017  . Hypokalemia   . Diabetes mellitus type 2, insulin dependent ([place])   . Hypomagnesemia   . UTI (urinary tract infection) 10/09/2017  . Complicated UTI (urinary tract infection) 10/09/2017  . Febrile illness   . UTI (urinary tract infection) due to urinary indwelling Foley catheter ([place]) 08/13/2017  . Decubitus ulcer of buttock, stage 4 ([place]) 11/22/2016  . Sepsis ([place]) 11/22/2016  . Leukocytosis   . Paraplegia ([place]) 06/01/2014  . Closed supracondylar fracture of right femur, periprosthetic 06/01/2014  . Femoral fracture ([place]) 06/01/2014  . Encounter for therapeutic drug monitoring 01/05/2014  . Foot drop, bilateral 10/13/2012  . Neurogenic bladder 09/02/2012  . Neurogenic pain of lower extremity 09/02/2012  . HTN (hypertension) 07/21/2012  . HLD (hyperlipidemia) 07/21/2012  . GERD (gastroesophageal reflux disease) 07/21/2012  . Multiple sclerosis (HCC)     Orientation RESPIRATION BLADDER Height & Weight     Self, Time,  Situation, Place  Normal Continent Weight: 184 lb 15.5 oz (83.9 kg) Height:  5\' 2"  (157.5 cm)  BEHAVIORAL SYMPTOMS/MOOD NEUROLOGICAL BOWEL NUTRITION STATUS      Continent Diet(Carb Modified)  AMBULATORY STATUS COMMUNICATION OF NEEDS Skin   Total Care Verbally Normal                       Personal Care Assistance Level of Assistance  Bathing, Feeding, Dressing Bathing Assistance: Maximum assistance Feeding assistance: Independent Dressing Assistance: Maximum assistance     Functional Limitations Info  Sight, Hearing, Speech Sight Info: Adequate Hearing Info: Adequate Speech Info: Adequate    SPECIAL CARE FACTORS FREQUENCY                       Contractures      Additional Factors Info  Code Status, Allergies Code Status Info: Full Allergies Info: Codeine, Ultram (Tramadol), Januvia (Sitagliptin)           Current Medications (01/31/2018):  This is the current hospital active medication list Current Facility-Administered Medications  Medication Dose Route Frequency Provider Last Rate Last Dose  . 0.9 %  sodium chloride infusion  250 mL Intravenous PRN Raynelle Bring, MD      . acetaminophen (TYLENOL) tablet 650 mg  650 mg Oral Q4H PRN Raynelle Bring, MD   650 mg at 01/30/18 2305  . baclofen (LIORESAL) tablet 10 mg  10 mg Oral Q24H Raynelle Bring, MD      . baclofen (LIORESAL) tablet 15 mg  15 mg Oral Daily Raynelle Bring, MD   15  mg at 01/31/18 1040  . baclofen (LIORESAL) tablet 20 mg  20 mg Oral QHS Raynelle Bring, MD   20 mg at 01/30/18 2305  . calcium citrate (CALCITRATE - dosed in mg elemental calcium) tablet 200 mg of elemental calcium  200 mg of elemental calcium Oral Daily Raynelle Bring, MD   200 mg of elemental calcium at 01/31/18 1045  . cholecalciferol (VITAMIN D) tablet 1,000 Units  1,000 Units Oral Daily Raynelle Bring, MD   1,000 Units at 01/31/18 1043  . Dimethyl Fumarate CPDR 240 mg  240 mg Oral BID Raynelle Bring, MD      . DULoxetine  (CYMBALTA) DR capsule 90 mg  90 mg Oral Daily Raynelle Bring, MD   90 mg at 01/31/18 1039  . enoxaparin (LOVENOX) injection 40 mg  40 mg Subcutaneous Q24H Raynelle Bring, MD   40 mg at 01/30/18 2309  . feeding supplement (BOOST / RESOURCE BREEZE) liquid 1 Container  1 Container Oral TID BM Raynelle Bring, MD      . feeding supplement (PRO-STAT SUGAR FREE 64) liquid 30 mL  30 mL Oral BID Raynelle Bring, MD   30 mL at 01/31/18 1045  . fesoterodine (TOVIAZ) tablet 4 mg  4 mg Oral Daily Raynelle Bring, MD   4 mg at 01/31/18 1039  . gabapentin (NEURONTIN) capsule 300 mg  300 mg Oral BID Raynelle Bring, MD   300 mg at 01/31/18 1044  . hydrocerin (EUCERIN) cream   Topical BID Raynelle Bring, MD      . insulin aspart (novoLOG) injection 0-15 Units  0-15 Units Subcutaneous Q4H Raynelle Bring, MD   5 Units at 01/31/18 1330  . methimazole (TAPAZOLE) tablet 5 mg  5 mg Oral Daily Raynelle Bring, MD   5 mg at 01/31/18 0849  . pantoprazole (PROTONIX) EC tablet 40 mg  40 mg Oral Daily Raynelle Bring, MD   40 mg at 01/31/18 1044  . polyethylene glycol (MIRALAX / GLYCOLAX) packet 17 g  17 g Oral Daily PRN Raynelle Bring, MD      . Derrill Memo ON 02/01/2018] predniSONE (DELTASONE) tablet 2.5 mg  2.5 mg Oral Q48H Raynelle Bring, MD      . predniSONE (DELTASONE) tablet 5 mg  5 mg Oral Q48H Raynelle Bring, MD   5 mg at 01/31/18 0839  . senna-docusate (Senokot-S) tablet 2 tablet  2 tablet Oral QHS Raynelle Bring, MD      . sodium chloride flush (NS) 0.9 % injection 3 mL  3 mL Intravenous Q12H Raynelle Bring, MD   3 mL at 01/30/18 2308  . sodium chloride flush (NS) 0.9 % injection 3 mL  3 mL Intravenous PRN Raynelle Bring, MD         Discharge Medications: Please see discharge summary for a list of discharge medications.  Relevant Imaging Results:  Relevant Lab Results:   Additional Information SSN: [id number]  Pricilla Holm, LCSW

## 2018-02-01 ENCOUNTER — Encounter (HOSPITAL_COMMUNITY): Payer: Self-pay | Admitting: Urology

## 2018-02-05 ENCOUNTER — Encounter: Payer: Self-pay | Admitting: Internal Medicine

## 2018-02-05 ENCOUNTER — Non-Acute Institutional Stay (SKILLED_NURSING_FACILITY): Payer: Medicare Other | Admitting: Internal Medicine

## 2018-02-05 DIAGNOSIS — R05 Cough: Secondary | ICD-10-CM | POA: Diagnosis not present

## 2018-02-05 DIAGNOSIS — R059 Cough, unspecified: Secondary | ICD-10-CM

## 2018-02-05 DIAGNOSIS — Z794 Long term (current) use of insulin: Secondary | ICD-10-CM

## 2018-02-05 DIAGNOSIS — E119 Type 2 diabetes mellitus without complications: Secondary | ICD-10-CM | POA: Diagnosis not present

## 2018-02-05 NOTE — Patient Instructions (Signed)
See assessment and plan under each diagnosis in the problem list and acutely for this visit 

## 2018-02-05 NOTE — Progress Notes (Signed)
    NURSING HOME LOCATION:  [facility] ROOM NUMBER:  [id number]  CODE STATUS:  DNR  PCP:  [name] [address]  This is a nursing facility follow up for specific acute issue of cough.  Interim medical record and care since last [place] visit was updated with review of diagnostic studies and change in clinical status since last visit were documented.  HPI: The patient states her cough began approximately August 4.  Initially it was productive of white and yellow sputum ;she also had yellow nasal discharge.Guaifenesin is of some benefit for the cough.  She has had intermittent paroxysmal coughing to the point of choking. Dry eyes is a chronic issue. She has noted some pressure in her ears but no other upper respiratory tract symptoms;GI or extrinsic symptoms. She is not on ACE inhibitor;she is on Tapazole. She has no pulmonary diagnoses but does have GERD as well as multiple sclerosis. She has never smoked. She is a diabetic; her glucose is been markedly variable. FBS has been as low as 104 ;post evening meal glucose up to 399.  Last A1c was 6.9% on 10/25/2017.  Review of systems: Constitutional: No fever, significant weight change, fatigue  Eyes: No redness, discharge, pain, vision change ENT/mouth: No change in hearing, sore throat  Cardiovascular: No chest pain, palpitations, paroxysmal nocturnal dyspnea, claudication, edema  Respiratory: No hemoptysis  Gastrointestinal: No heartburn, dysphagia, abdominal pain, nausea /vomiting, rectal bleeding, melena, change in bowels Endocrine: No change in hair/skin/nails, excessive thirst, excessive hunger, excessive urination  Hematologic/lymphatic: No lymphadenopathy Allergy/immunology: No itchy/watery eyes, significant sneezing, urticaria, angioedema  Physical exam:  Pertinent or positive findings: She is in a wheelchair. She has limited mobility of her trunk.  Intermittent brassy cough.   She exhibits a resting tachycardia.  Breath sounds are decreased.  Abdomen is protuberant.  Dorsalis pedis pulses are strong.  She has 1/2+ edema at the sock line. Fusiform changes of the knees are noted. Suprapubic catheter is present. She has dressings over the medial malleoli.  General appearance: Adequately nourished; no acute distress, increased work of breathing is present.   Lymphatic: No lymphadenopathy about the head, neck, axilla. Eyes: No conjunctival inflammation or lid edema is present. There is no scleral icterus. Ears:  External ear exam shows no significant lesions or deformities.   Nose:  External nasal examination shows no deformity or inflammation. Nasal mucosa are pink and moist without lesions, exudates Oral exam:  Lips and gums are healthy appearing. There is no oropharyngeal erythema or exudate. Neck:  No thyromegaly, masses, tenderness noted.    Heart:  No gallop, murmur, click, rub .  Lungs:  without wheezes, rhonchi, rales, rubs. Abdomen: Bowel sounds are normal. Abdomen is soft and nontender with no organomegaly, hernias, masses. GU: Deferred  Extremities:  No cyanosis, clubbing  Skin: Warm & dry w/o tenting. No significant rash.  See summary under each active problem in the Problem List with associated updated therapeutic plan    .

## 2018-02-06 DIAGNOSIS — E119 Type 2 diabetes mellitus without complications: Secondary | ICD-10-CM | POA: Diagnosis not present

## 2018-02-06 DIAGNOSIS — Z79899 Other long term (current) drug therapy: Secondary | ICD-10-CM | POA: Diagnosis not present

## 2018-02-06 DIAGNOSIS — D649 Anemia, unspecified: Secondary | ICD-10-CM | POA: Diagnosis not present

## 2018-02-06 LAB — CBC AND DIFFERENTIAL
HEMATOCRIT: 36 (ref 36–46)
Hemoglobin: 11.3 — AB (ref 12.0–16.0)
Neutrophils Absolute: 7
Platelets: 590 — AB (ref 150–399)
WBC: 9.1

## 2018-02-06 LAB — HEMOGLOBIN A1C: HEMOGLOBIN A1C: 7.5 — AB (ref 4.0–6.0)

## 2018-02-06 NOTE — Assessment & Plan Note (Signed)
Monitor glucose response to steroid burst

## 2018-02-09 DIAGNOSIS — L89152 Pressure ulcer of sacral region, stage 2: Secondary | ICD-10-CM | POA: Diagnosis not present

## 2018-02-09 DIAGNOSIS — L89613 Pressure ulcer of right heel, stage 3: Secondary | ICD-10-CM | POA: Diagnosis not present

## 2018-02-09 DIAGNOSIS — L89214 Pressure ulcer of right hip, stage 4: Secondary | ICD-10-CM | POA: Diagnosis not present

## 2018-02-16 DIAGNOSIS — L89613 Pressure ulcer of right heel, stage 3: Secondary | ICD-10-CM | POA: Diagnosis not present

## 2018-02-16 DIAGNOSIS — L89214 Pressure ulcer of right hip, stage 4: Secondary | ICD-10-CM | POA: Diagnosis not present

## 2018-02-16 DIAGNOSIS — L89152 Pressure ulcer of sacral region, stage 2: Secondary | ICD-10-CM | POA: Diagnosis not present

## 2018-03-04 ENCOUNTER — Encounter: Payer: Self-pay | Admitting: Adult Health

## 2018-03-04 ENCOUNTER — Non-Acute Institutional Stay (SKILLED_NURSING_FACILITY): Payer: Medicare Other | Admitting: Adult Health

## 2018-03-04 DIAGNOSIS — E0590 Thyrotoxicosis, unspecified without thyrotoxic crisis or storm: Secondary | ICD-10-CM

## 2018-03-04 DIAGNOSIS — Z794 Long term (current) use of insulin: Secondary | ICD-10-CM | POA: Diagnosis not present

## 2018-03-04 DIAGNOSIS — F339 Major depressive disorder, recurrent, unspecified: Secondary | ICD-10-CM

## 2018-03-04 DIAGNOSIS — K219 Gastro-esophageal reflux disease without esophagitis: Secondary | ICD-10-CM | POA: Diagnosis not present

## 2018-03-04 DIAGNOSIS — G35 Multiple sclerosis: Secondary | ICD-10-CM | POA: Diagnosis not present

## 2018-03-04 DIAGNOSIS — N319 Neuromuscular dysfunction of bladder, unspecified: Secondary | ICD-10-CM

## 2018-03-04 DIAGNOSIS — E1140 Type 2 diabetes mellitus with diabetic neuropathy, unspecified: Secondary | ICD-10-CM

## 2018-03-04 NOTE — Progress Notes (Signed)
Location:  [place] Room Number: 202-A Place of Service:  SNF (31) Provider:  Durenda Age, NP  Patient Care Team: [name], MD as PCP - General (Internal Medicine) Medina-Vargas, Senaida Lange, NP as Nurse Practitioner (Internal Medicine)  Extended Emergency Contact Information Primary Emergency Contact: Ferdinand Lango [place] Phone: [phone] Relation: Son Secondary Emergency Contact: Salli Quarry [place] Phone: [phone] Mobile Phone: [phone] Relation: Son  Code Status:  DNR  Goals of care: Advanced Directive information Advanced Directives 03/04/2018  Does Patient Have a Medical Advance Directive? Yes  Type of Advance Directive Out of facility DNR (pink MOST or yellow form)  Does patient want to make changes to medical advance directive? No - Patient declined  Copy of [place] in Chart? No - copy requested  Would patient like information on creating a medical advance directive? -  Pre-existing out of facility DNR order (yellow form or pink MOST form) -     Chief Complaint  Patient presents with  . Medical Management of Chronic Issues    The patient is seen for a routine [facility] visit    HPI:  Pt is a 77 y.o. female seen today for medical management of chronic diseases.  She is a long-term care resident of [facility].  She has a PMH of MS, IDDM2, hypertension, spinal stenosis, neurogenic bladder with suprapubic catheter, decubitus ulcers, and complicated UTIs. She was seen in the room today. She did not verbalize any concerns.    Past Medical History:  Diagnosis Date  . Age related osteoporosis   . Aphasia   . Constipation   . Cystostomy in place [facility])   . Degenerative arthritis    osteoarthritis   . Depression   . Diabetes mellitus without complication ([place])    type 2   . Full incontinence of feces    . Gait disturbance   . GERD (gastroesophageal reflux disease)   . History of falling   . Hyperlipidemia   . Hypertension   . Multiple sclerosis ([place])   . Neuromuscular disorder (HCC)    Bilateral hand carpal tunnel syndrome  . Neuromuscular dysfunction of bladder   . Nontoxic multinodular goiter   . Obesity   . Obesity   . Other adrenocortical insufficiency ([place])   . Paraplegia ([place])   . Personal history of urinary (tract) infections   . Polyneuropathy   . Pressure ulcer of right buttock, stage 4 ([place])   . Spinal stenosis in cervical region   . Spinal stenosis of lumbosacral region   . Spinal stenosis, thoracic   . Vitamin D deficiency    Past Surgical History:  Procedure Laterality Date  . ABDOMINAL HYSTERECTOMY    . BACK SURGERY    . BOTOX INJECTION N/A 01/30/2018   Procedure: CYSTOSCOPY BOTOX INJECTION, SUPRAPUBIC EXCHANGE;  Surgeon: Lucas Mallow, MD;  [facility];  Service: Urology;  Laterality: N/A;  . CATARACT EXTRACTION Right   . CHOLECYSTECTOMY    . FEMUR IM NAIL Right 06/02/2014   Procedure: INTRAMEDULLARY (IM) RETROGRADE FEMORAL NAILING;  Surgeon: Johnny Bridge, MD;  Location: [place];  Service: Orthopedics;  Laterality: Right;  . GROIN DISSECTION Left 10/24/2017   Procedure: IRRIGATION AND DEBRIDEMENT, LEFT THIGH ABCESS ;  Surgeon: Alphonsa Overall, MD;  [facility];  Service: General;  Laterality: Left;  . IR CATHETER  TUBE CHANGE  10/13/2017  . left hand carpal tunnel surgery    . REPLACEMENT TOTAL KNEE BILATERAL  2004    Allergies  Allergen Reactions  . Codeine Other (See Comments)    Difficult breathing and skin problem  . Ultram [Tramadol] Other (See Comments)    Difficult breathing and skin peeling  . Januvia [Sitagliptin] Rash and Other (See Comments)    Blisters    Outpatient Encounter Medications as of 03/04/2018  Medication Sig  . acetaminophen (TYLENOL) 325 MG tablet Take 650 mg by mouth every 6 (six) hours as needed for mild pain or  fever.   . Amino Acids-Protein Hydrolys (FEEDING SUPPLEMENT, PRO-STAT SUGAR FREE 64,) LIQD Take 30 mLs by mouth 2 (two) times daily.  . ARTIFICIAL TEAR OP Apply 1 drop to eye 4 (four) times daily as needed (dry eyes).   . baclofen (LIORESAL) 10 MG tablet Take 10-20 mg by mouth See admin instructions. Give 1-1/2 tablets to = 15 mg QAM, 1 tablet to = 10 mg midday, take 2 tablets to = 20 mg qhs  . calcium citrate (CALCITRATE - DOSED IN MG ELEMENTAL CALCIUM) 950 MG tablet Take 200 mg of elemental calcium by mouth daily.  . cholecalciferol (VITAMIN D) 1000 UNITS tablet Take 1,000 Units by mouth daily.  . Dimethyl Fumarate (TECFIDERA) 240 MG CPDR Take 1 capsule (240 mg total) by mouth 2 (two) times daily.  . DULoxetine (CYMBALTA) 30 MG capsule Take 90 mg by mouth daily.   Marland Kitchen gabapentin (NEURONTIN) 300 MG capsule Take 1 capsule (300 mg total) by mouth 2 (two) times daily.  . insulin glargine (LANTUS) 100 UNIT/ML injection Inject 0.2 mLs (20 Units total) into the skin at bedtime.  . insulin lispro (HUMALOG) 100 UNIT/ML injection Inject 5-10 Units into the skin 3 (three) times daily before meals. If CBG 200-300 = 5 units, 300-400 = 10 units, greater than 400 call provider  . methimazole (TAPAZOLE) 5 MG tablet Take 1 tablet (5 mg total) by mouth daily.  . Multiple Vitamin (MULTIVITAMIN) tablet Take 1 tablet by mouth daily.  Marland Kitchen NUTRITIONAL SUPPLEMENT LIQD Take 120 mLs by mouth 2 (two) times daily. NSA MedPass   . pantoprazole (PROTONIX) 40 MG tablet Take 40 mg by mouth daily.  . polyethylene glycol (MIRALAX / GLYCOLAX) packet Take 17 g by mouth daily as needed for mild constipation.   . predniSONE (DELTASONE) 5 MG tablet 1 tablet on odd days, one half tablet on even days  . senna-docusate (SENOKOT-S) 8.6-50 MG tablet Take 2 tablets by mouth at bedtime.  . Skin Protectants, Misc. (EUCERIN) cream Apply 1 application topically 2 (two) times daily.  Marland Kitchen tolterodine (DETROL LA) 4 MG 24 hr capsule Take 4 mg by mouth  daily.   . [DISCONTINUED] guaiFENesin (ROBITUSSIN) 100 MG/5ML liquid Take 200 mg by mouth 3 (three) times daily as needed for cough.  . [DISCONTINUED] nutrition supplement, JUVEN, (JUVEN) PACK Take 1 packet by mouth daily.   No facility-administered encounter medications on file as of 03/04/2018.     Review of Systems  GENERAL: No change in appetite, no fatigue, no weight changes, no fever, chills or weakness MOUTH and THROAT: Denies oral discomfort, gingival pain or bleeding RESPIRATORY: no cough, SOB, DOE, wheezing, hemoptysis CARDIAC: No chest pain, or palpitations GI: No abdominal pain, diarrhea, constipation, heart burn, nausea or vomiting GU: Denies dysuria, frequency, hematuria, incontinence, or discharge PSYCHIATRIC: Denies feelings of depression or anxiety. No report of hallucinations, insomnia, paranoia, or agitation  Immunization History  Administered Date(s) Administered  . Influenza-Unspecified 04/28/2017  . Pneumococcal-Unspecified 03/31/2016, 11/18/2017  . Tdap 11/13/2017   Pertinent  Health Maintenance Due  Topic Date Due  . FOOT EXAM  11/05/1952  . OPHTHALMOLOGY EXAM  11/05/1952  . COLONOSCOPY  11/05/1992  . INFLUENZA VACCINE  01/29/2018  . HEMOGLOBIN A1C  08/09/2018  . PNA vac Low Risk Adult (2 of 2 - PCV13) 11/19/2018  . URINE MICROALBUMIN  01/14/2019  . DEXA SCAN  Discontinued   Fall Risk  11/13/2017 10/14/2017  Falls in the past year? No No    Vitals:   03/04/18 1033  BP: 134/88  Pulse: 86  Resp: 20  Temp: 97.6 F (36.4 C)  TempSrc: Oral  SpO2: 96%  Weight: 184 lb 12.8 oz (83.8 kg)  Height: 5\' 2"  (1.575 m)   Body mass index is 33.8 kg/m.  Physical Exam  GENERAL APPEARANCE: Well nourished. In no acute distress. Obese SKIN:  Sacral wound covered with dry dressing.  MOUTH and THROAT: Lips are without lesions. Oral mucosa is moist and without lesions. Tongue is normal in shape, size, and color and without lesions RESPIRATORY: Breathing is  even & unlabored, BS CTAB CARDIAC: RRR, no murmur,no extra heart sounds, BLE 1+ edema GI: Abdomen soft, normal BS, no masses, no tenderness GU:  Has suprapubic catheter draining to urine bag with clear yellowish urine EXTREMITIES:  Able to move BUE NEUROLOGICAL: There is no tremor. Speech is clear. Not able to move BLE PSYCHIATRIC: Alert and oriented X 3. Affect and behavior are appropriate   Labs reviewed: Recent Labs    10/27/17 0536 10/28/17 0554 10/29/17 0530 01/19/18  NA 140 142 141 135*  K 3.7 3.6 3.2* 3.7  CL 97* 97* 94*  --   CO2 33* 34* 33*  --   GLUCOSE 96 142* 92  --   BUN 13 12 12 16   CREATININE 0.54 0.49 0.47 0.5  CALCIUM 9.1 8.9 9.0  --   MG 1.7 1.5* 1.5*  --   PHOS 3.2 3.3 2.7  --    Recent Labs    10/27/17 0536 10/28/17 0554 10/29/17 0530  AST 13* 12* 14*  ALT 9* 8* 9*  ALKPHOS 70 66 67  BILITOT 0.4 0.5 0.4  PROT 6.1* 6.0* 6.3*  ALBUMIN 2.2* 2.1* 2.3*   Recent Labs    10/27/17 0536 10/28/17 0554 10/29/17 0530 01/19/18 02/06/18  WBC 8.9 9.2 12.0* 11.0 9.1  NEUTROABS 6.3 6.0 7.5 8 7   HGB 9.4* 8.8* 9.0* 10.5* 11.3*  HCT 29.9* 28.1* 29.1* 34* 36  MCV 81.3 81.9 81.1  --   --   PLT 701* 610* 677* 244 590*   Lab Results  Component Value Date   TSH 1.57 01/14/2018   Lab Results  Component Value Date   HGBA1C 7.5 (A) 02/06/2018    Assessment/Plan  1. Multiple sclerosis ([place]) - continue supportive care, continue Tecfidera DR 240 mg 1 capsule BID, Baclofen 10 mg  1/2 tab =  5 mg Q AM, 10 mg Q mid day and 20 mg Q HS, Prednisone 2.5 mg 1 tab every other day alternating with 5 mg every other day   2. Hyperthyroidism - continue methimazole 5 mg 1 tab daily Lab Results  Component Value Date   TSH 1.57 01/14/2018    3. Type 2 diabetes mellitus with diabetic neuropathy, with long-term current use of insulin (HCC) - continue gabapentin 300 mg 1 capsule twice a day, humalog 100 units/mL sliding  scale TIDac, lantus 100 units/mL inject 20 units  subcutaneous daily at bedtime Lab Results  Component Value Date   HGBA1C 7.5 (A) 02/06/2018     4. Neurogenic bladder - continue Tolterodine ER 4 mg 1 capsule daily   5. Gastroesophageal reflux disease, esophagitis presence not specified - continue pantoprazole 40 mg 1 tab daily   6. Recurrent depression - mood is stable, continue duloxetine 30 mg 3 capsules = 90 mg  daily    Family/ staff Communication: Discussed plan of care with resident.  Labs/tests ordered:  None  Goals of care:   Long-term care.   Durenda Age, NP [facility] and [facility] [phone] (Monday-Friday 8:00 a.m. - 5:00 p.m.) [phone] (after hours)

## 2018-03-13 DIAGNOSIS — I739 Peripheral vascular disease, unspecified: Secondary | ICD-10-CM | POA: Diagnosis not present

## 2018-03-13 DIAGNOSIS — Z794 Long term (current) use of insulin: Secondary | ICD-10-CM | POA: Diagnosis not present

## 2018-03-13 DIAGNOSIS — B351 Tinea unguium: Secondary | ICD-10-CM | POA: Diagnosis not present

## 2018-03-13 LAB — HM DIABETES FOOT EXAM

## 2018-03-25 ENCOUNTER — Ambulatory Visit: Payer: Medicare (Managed Care) | Admitting: Adult Health

## 2018-03-30 DIAGNOSIS — R293 Abnormal posture: Secondary | ICD-10-CM | POA: Diagnosis not present

## 2018-03-30 DIAGNOSIS — G35 Multiple sclerosis: Secondary | ICD-10-CM | POA: Diagnosis not present

## 2018-04-08 LAB — FECAL OCCULT BLOOD, GUAIAC: Fecal Occult Blood: NEGATIVE

## 2018-04-14 ENCOUNTER — Non-Acute Institutional Stay: Payer: Medicare Other | Admitting: Primary Care

## 2018-04-14 DIAGNOSIS — G35 Multiple sclerosis: Secondary | ICD-10-CM

## 2018-04-14 DIAGNOSIS — Z515 Encounter for palliative care: Secondary | ICD-10-CM

## 2018-04-14 DIAGNOSIS — M62838 Other muscle spasm: Secondary | ICD-10-CM

## 2018-04-15 ENCOUNTER — Ambulatory Visit (INDEPENDENT_AMBULATORY_CARE_PROVIDER_SITE_OTHER): Payer: Medicare Other | Admitting: Endocrinology

## 2018-04-15 ENCOUNTER — Encounter: Payer: Self-pay | Admitting: Endocrinology

## 2018-04-15 VITALS — BP 128/70 | HR 92 | Ht 62.0 in | Wt 192.0 lb

## 2018-04-15 DIAGNOSIS — G35 Multiple sclerosis: Secondary | ICD-10-CM | POA: Diagnosis not present

## 2018-04-15 DIAGNOSIS — R4701 Aphasia: Secondary | ICD-10-CM | POA: Diagnosis not present

## 2018-04-15 DIAGNOSIS — E039 Hypothyroidism, unspecified: Secondary | ICD-10-CM | POA: Diagnosis not present

## 2018-04-15 DIAGNOSIS — R7989 Other specified abnormal findings of blood chemistry: Secondary | ICD-10-CM | POA: Diagnosis not present

## 2018-04-15 LAB — TSH
TSH: 1.28 u[IU]/mL (ref 0.35–4.50)
TSH: 2.94 (ref 0.41–5.90)

## 2018-04-15 LAB — T4, FREE: Free T4: 1.06 ng/dL (ref 0.60–1.60)

## 2018-04-15 NOTE — Patient Instructions (Addendum)
Thyroid blood tests are requested for you today.  We'll let you know about the results.  Please continue the same methimazole.  If ever you have fever while taking methimazole, stop it and call us, even if the reason is obvious, because of the risk of a rare side-effect.   Please come back for a follow-up appointment in 4-5 months.

## 2018-04-15 NOTE — Progress Notes (Signed)
Subjective:    Patient ID: Laura Roberts, female    DOB: Apr 11, 1941, 77 y.o.   MRN: [id number]  HPI Pt returns for f/u of hyperthyroidism (dx'ed 2014; she lives at [facility], so she cannot be isolated for RAI; [place] (2012) showed multiple nodules scattered throughout the right lobe of no more than 13 mm in diameter).  No new symptoms.   Past Medical History:  Diagnosis Date  . Age related osteoporosis   . Aphasia   . Constipation   . Cystostomy in place [facility])   . Degenerative arthritis    osteoarthritis   . Depression   . Diabetes mellitus without complication ([place])    type 2   . Full incontinence of feces   . Gait disturbance   . GERD (gastroesophageal reflux disease)   . History of falling   . Hyperlipidemia   . Hypertension   . Multiple sclerosis ([place])   . Neuromuscular disorder (HCC)    Bilateral hand carpal tunnel syndrome  . Neuromuscular dysfunction of bladder   . Nontoxic multinodular goiter   . Obesity   . Obesity   . Other adrenocortical insufficiency ([place])   . Paraplegia ([place])   . Personal history of urinary (tract) infections   . Polyneuropathy   . Pressure ulcer of right buttock, stage 4 ([place])   . Spinal stenosis in cervical region   . Spinal stenosis of lumbosacral region   . Spinal stenosis, thoracic   . Vitamin D deficiency     Past Surgical History:  Procedure Laterality Date  . ABDOMINAL HYSTERECTOMY    . BACK SURGERY    . BOTOX INJECTION N/A 01/30/2018   Procedure: CYSTOSCOPY BOTOX INJECTION, SUPRAPUBIC EXCHANGE;  Surgeon: Lucas Mallow, MD;  [facility];  Service: Urology;  Laterality: N/A;  . CATARACT EXTRACTION Right   . CHOLECYSTECTOMY    . FEMUR IM NAIL Right 06/02/2014   Procedure: INTRAMEDULLARY (IM) RETROGRADE FEMORAL NAILING;  Surgeon: Johnny Bridge, MD;  Location: [place];  Service: Orthopedics;  Laterality: Right;  . GROIN DISSECTION Left 10/24/2017   Procedure: IRRIGATION AND DEBRIDEMENT, LEFT THIGH ABCESS ;  Surgeon:  Alphonsa Overall, MD;  [facility];  Service: General;  Laterality: Left;  . IR CATHETER TUBE CHANGE  10/13/2017  . left hand carpal tunnel surgery    . REPLACEMENT TOTAL KNEE BILATERAL  2004    Social History   Socioeconomic History  . Marital status: Widowed    Spouse name: Not on file  . Number of children: 2  . Years of education: 58yr colleg  . Highest education level: Not on file  Occupational History  . Occupation: N/A    Employer: [employer]    Comment: [employer]  Social Needs  . Financial resource strain: Not hard at all  . Food insecurity:    Worry: Never true    Inability: Never true  . Transportation needs:    Medical: No    Non-medical: No  Tobacco Use  . Smoking status: Never Smoker  . Smokeless tobacco: Never Used  Substance and Sexual Activity  . Alcohol use: No  . Drug use: No  . Sexual activity: Never  Lifestyle  . Physical activity:    Days per week: 7 days    Minutes per session: 30 min  . Stress: Rather much  Relationships  . Social connections:    Talks on phone: More than three times a week    Gets  together: More than three times a week    Attends religious service: Never    Active member of club or organization: No    Attends meetings of clubs or organizations: Never    Relationship status: Widowed  . Intimate partner violence:    Fear of current or ex partner: No    Emotionally abused: No    Physically abused: No    Forced sexual activity: No  Other Topics Concern  . Not on file  Social History Narrative   Patient is widowed with 2 children   Patient is right handed   Patient has 2 yrs of college   Patient drinks 2-3 cups of tea daily    Current Outpatient Medications on File Prior to Visit  Medication Sig Dispense Refill  . acetaminophen (TYLENOL) 325 MG tablet Take 650 mg by mouth every 6 (six) hours as needed for mild pain or fever.     . Amino Acids-Protein Hydrolys (FEEDING SUPPLEMENT, PRO-STAT SUGAR FREE 64,)  LIQD Take 30 mLs by mouth 2 (two) times daily. 900 mL 0  . ARTIFICIAL TEAR OP Apply 1 drop to eye 4 (four) times daily as needed (dry eyes).     . baclofen (LIORESAL) 10 MG tablet Take 10-20 mg by mouth See admin instructions. Give 1-1/2 tablets to = 15 mg QAM, 1 tablet to = 10 mg midday, take 2 tablets to = 20 mg qhs    . calcium citrate (CALCITRATE - DOSED IN MG ELEMENTAL CALCIUM) 950 MG tablet Take 200 mg of elemental calcium by mouth daily.    . cholecalciferol (VITAMIN D) 1000 UNITS tablet Take 1,000 Units by mouth daily.    . Dimethyl Fumarate (TECFIDERA) 240 MG CPDR Take 1 capsule (240 mg total) by mouth 2 (two) times daily. 60 capsule 11  . DULoxetine (CYMBALTA) 30 MG capsule Take 90 mg by mouth daily.     Marland Kitchen gabapentin (NEURONTIN) 300 MG capsule Take 1 capsule (300 mg total) by mouth 2 (two) times daily. 60 capsule 0  . insulin glargine (LANTUS) 100 UNIT/ML injection Inject 0.2 mLs (20 Units total) into the skin at bedtime. 10 mL 11  . insulin lispro (HUMALOG) 100 UNIT/ML injection Inject 5-10 Units into the skin 3 (three) times daily before meals. If CBG 200-300 = 5 units, 300-400 = 10 units, greater than 400 call provider    . methimazole (TAPAZOLE) 5 MG tablet Take 1 tablet (5 mg total) by mouth daily. 30 tablet 5  . Multiple Vitamin (MULTIVITAMIN) tablet Take 1 tablet by mouth daily.    Marland Kitchen NUTRITIONAL SUPPLEMENT LIQD Take 120 mLs by mouth 2 (two) times daily. NSA MedPass     . pantoprazole (PROTONIX) 40 MG tablet Take 40 mg by mouth daily.    . polyethylene glycol (MIRALAX / GLYCOLAX) packet Take 17 g by mouth daily as needed for mild constipation.     . predniSONE (DELTASONE) 5 MG tablet 1 tablet on odd days, one half tablet on even days    . senna-docusate (SENOKOT-S) 8.6-50 MG tablet Take 2 tablets by mouth at bedtime.    . Skin Protectants, Misc. (EUCERIN) cream Apply 1 application topically 2 (two) times daily.    Marland Kitchen tolterodine (DETROL LA) 4 MG 24 hr capsule Take 4 mg by mouth  daily.      No current facility-administered medications on file prior to visit.     Allergies  Allergen Reactions  . Codeine Other (See Comments)    Difficult breathing and  skin problem  . Ultram [Tramadol] Other (See Comments)    Difficult breathing and skin peeling  . Januvia [Sitagliptin] Rash and Other (See Comments)    Blisters    Family History  Problem Relation Age of Onset  . Multiple sclerosis Other        neices.   . Cancer Mother   . Diabetes Mother   . GI Bleed Sister        diverticulitis  . Thyroid disease Neg Hx     BP 128/70 (BP Location: Right Arm, Patient Position: Sitting, Cuff Size: Large)   Pulse 92   Ht 5\' 2"  (1.575 m)   Wt 192 lb (87.1 kg) Comment: Pt verbalized. Unable to stand  SpO2 96%   BMI 35.12 kg/m    Review of Systems Denies fever.      Objective:   Physical Exam VITAL SIGNS:  See vs page GENERAL: no distress.  In wheelchair.   NECK: There is no palpable thyroid enlargement.  No thyroid nodule is palpable.  No palpable lymphadenopathy at the anterior neck.   Lab Results  Component Value Date   TSH 1.28 04/15/2018   T3TOTAL 48 (L) 08/15/2017      Assessment & Plan:  Hyperthyroidism: well-controlled Multinodular goiter: nonpalpable.  Given frail elderly state and hyperthyroidism, she doe not need f/u US.  Patient Instructions  Thyroid blood tests are requested for you today.  We'll let you know about the results.  Please continue the same methimazole.  If ever you have fever while taking methimazole, stop it and call us, even if the reason is obvious, because of the risk of a rare side-effect.   Please come back for a follow-up appointment in 4-5 months.

## 2018-04-15 NOTE — Progress Notes (Signed)
PALLIATIVE CARE CONSULT VISIT   PATIENT NAME: Laura Roberts DOB: 04-07-1941 MRN: [id number]  PRIMARY CARE PROVIDER:   [name], MD  REFERRING PROVIDER:  [name], [address], [place] [address]  RESPONSIBLE PARTY:   Yatzari, Jonsson III, Home Phone: [phone] and son Steckman,Tyrone, (h)  [phone], (m) [phone]. Both Sons are POA.  ASSESSMENT:      1. Pain  -Of etiology related to MS, specifically in LE nerves and muscle spasms. Currently taking gabapentin, baclofen, duloxetine, tecfidera. Patient feels the Tecfidera has helped her quite a bit with MS symptoms. States she is on 90 mg duloxetine but has had some issues with insurance and ordering this dose per her report. Taking 300 mg bid gabapentin and had tried 600 bid but was too sedating. She endorses ongoing neurological pain however. Baclofen is moderately helpful.  2. Goals of care : States both sons have HPOA. One lives in [place] and the other in [place]. She has DNR in place and is interested in discussion of MOST on next visit.  3. Cough Endorses cough for a week, states she has used nebulizers in the past. Patient is afebrile and lungs bases clear. Few anterior exp. Wheezes. Denies h/o lung disease.  RECOMMENDATIONS and PLAN:  1. Pain related to MS- Has a follow up appointment  with Neurology in [facility] November and has arranged transportation. [facility] to assess for improving pain control. Recommend gradual gabapentin titration to 300 mg q 8 hrs, patient was willing to consider gradual titration. 2. Will discuss MOST with patient on next visit if not currently on file. 3. Recommend duoneb 1 vial per nebulizer q 6 h prn cough, wheezing 4.  F/u by facility provider to assess respiratory status if cough persists or worsens. Patient states she now is on [employer] as well and is expecting NP to visit soon.   I spent 35 minutes providing this consultation,  from 1500 To 1535. More than  50% of the time in this consultation was spent coordinating communication.   HISTORY OF PRESENT ILLNESS:  Laura Roberts is a 77 y.o. year old female with multiple medical problems including MS, paraplegia, depression, h/o fracture, GED, DM2,  HTN, neurogenic pain of LE, suprapubic urinary catheter. Palliative Care was asked to help address goals of care.   CODE STATUS: DNR. Would like to discuss MOST form on next visit.  PPS: 50% HOSPICE ELIGIBILITY/DIAGNOSIS: TBD  PAST MEDICAL HISTORY:  Past Medical History:  Diagnosis Date  . Age related osteoporosis   . Aphasia   . Constipation   . Cystostomy in place [facility])   . Degenerative arthritis    osteoarthritis   . Depression   . Diabetes mellitus without complication ([place])    type 2   . Full incontinence of feces   . Gait disturbance   . GERD (gastroesophageal reflux disease)   . History of falling   . Hyperlipidemia   . Hypertension   . Multiple sclerosis ([place])   . Neuromuscular disorder (HCC)    Bilateral hand carpal tunnel syndrome  . Neuromuscular dysfunction of bladder   . Nontoxic multinodular goiter   . Obesity   . Obesity   . Other adrenocortical insufficiency ([place])   . Paraplegia ([place])   . Personal history of urinary (tract) infections   . Polyneuropathy   . Pressure ulcer of right buttock, stage 4 ([place])   . Spinal stenosis in cervical region   . Spinal stenosis  of lumbosacral region   . Spinal stenosis, thoracic   . Vitamin D deficiency     SOCIAL HX:  Social History   Tobacco Use  . Smoking status: Never Smoker  . Smokeless tobacco: Never Used  Substance Use Topics  . Alcohol use: No    ALLERGIES:  Allergies  Allergen Reactions  . Codeine Other (See Comments)    Difficult breathing and skin problem  . Ultram [Tramadol] Other (See Comments)    Difficult breathing and skin peeling  . Januvia [Sitagliptin] Rash and Other (See Comments)    Blisters     PERTINENT MEDICATIONS:  Outpatient Encounter  Medications as of 04/14/2018  Medication Sig  . acetaminophen (TYLENOL) 325 MG tablet Take 650 mg by mouth every 6 (six) hours as needed for mild pain or fever.   . Amino Acids-Protein Hydrolys (FEEDING SUPPLEMENT, PRO-STAT SUGAR FREE 64,) LIQD Take 30 mLs by mouth 2 (two) times daily.  . ARTIFICIAL TEAR OP Apply 1 drop to eye 4 (four) times daily as needed (dry eyes).   . baclofen (LIORESAL) 10 MG tablet Take 10-20 mg by mouth See admin instructions. Give 1-1/2 tablets to = 15 mg QAM, 1 tablet to = 10 mg midday, take 2 tablets to = 20 mg qhs  . calcium citrate (CALCITRATE - DOSED IN MG ELEMENTAL CALCIUM) 950 MG tablet Take 200 mg of elemental calcium by mouth daily.  . cholecalciferol (VITAMIN D) 1000 UNITS tablet Take 1,000 Units by mouth daily.  . Dimethyl Fumarate (TECFIDERA) 240 MG CPDR Take 1 capsule (240 mg total) by mouth 2 (two) times daily.  . DULoxetine (CYMBALTA) 30 MG capsule Take 90 mg by mouth daily.   Marland Kitchen gabapentin (NEURONTIN) 300 MG capsule Take 1 capsule (300 mg total) by mouth 2 (two) times daily.  . insulin glargine (LANTUS) 100 UNIT/ML injection Inject 0.2 mLs (20 Units total) into the skin at bedtime.  . insulin lispro (HUMALOG) 100 UNIT/ML injection Inject 5-10 Units into the skin 3 (three) times daily before meals. If CBG 200-300 = 5 units, 300-400 = 10 units, greater than 400 call provider  . methimazole (TAPAZOLE) 5 MG tablet Take 1 tablet (5 mg total) by mouth daily.  . Multiple Vitamin (MULTIVITAMIN) tablet Take 1 tablet by mouth daily.  Marland Kitchen NUTRITIONAL SUPPLEMENT LIQD Take 120 mLs by mouth 2 (two) times daily. NSA MedPass   . pantoprazole (PROTONIX) 40 MG tablet Take 40 mg by mouth daily.  . polyethylene glycol (MIRALAX / GLYCOLAX) packet Take 17 g by mouth daily as needed for mild constipation.   . predniSONE (DELTASONE) 5 MG tablet 1 tablet on odd days, one half tablet on even days  . senna-docusate (SENOKOT-S) 8.6-50 MG tablet Take 2 tablets by mouth at bedtime.  .  Skin Protectants, Misc. (EUCERIN) cream Apply 1 application topically 2 (two) times daily.  Marland Kitchen tolterodine (DETROL LA) 4 MG 24 hr capsule Take 4 mg by mouth daily.    No facility-administered encounter medications on file as of 04/14/2018.     PHYSICAL EXAM:   VS:98.2- 98-18 128/82 PO2= 98% General: NAD, WNWD, obese, pleasant and interactive Cardiovascular: S1, S2 regular rate and rhythm, rapid rate. Pulmonary: Endorses dry cough, posterior fields clear, anterior with few wheezes. Abdomen: soft, nontender, + bowel sounds, denies constipation GU: no suprapubic tenderness. Catheter in place, urine clear Extremities: no edema, no sensation, mobility in LE bilateral Skin: no rashes, lesions on gross exam Neurological: Weakness,LE motor/sensory deficits from [place]  Heeter AGPCNP-BC

## 2018-05-04 DIAGNOSIS — E109 Type 1 diabetes mellitus without complications: Secondary | ICD-10-CM | POA: Diagnosis not present

## 2018-05-04 DIAGNOSIS — H43813 Vitreous degeneration, bilateral: Secondary | ICD-10-CM | POA: Diagnosis not present

## 2018-05-04 DIAGNOSIS — H04123 Dry eye syndrome of bilateral lacrimal glands: Secondary | ICD-10-CM | POA: Diagnosis not present

## 2018-05-04 DIAGNOSIS — G35 Multiple sclerosis: Secondary | ICD-10-CM | POA: Diagnosis not present

## 2018-05-19 ENCOUNTER — Encounter: Payer: Self-pay | Admitting: Neurology

## 2018-05-19 ENCOUNTER — Ambulatory Visit (INDEPENDENT_AMBULATORY_CARE_PROVIDER_SITE_OTHER): Payer: Medicare Other | Admitting: Neurology

## 2018-05-19 ENCOUNTER — Other Ambulatory Visit: Payer: Self-pay

## 2018-05-19 VITALS — BP 133/84 | HR 96 | Resp 18 | Wt 193.0 lb

## 2018-05-19 DIAGNOSIS — G8220 Paraplegia, unspecified: Secondary | ICD-10-CM | POA: Diagnosis not present

## 2018-05-19 DIAGNOSIS — Z5181 Encounter for therapeutic drug level monitoring: Secondary | ICD-10-CM | POA: Diagnosis not present

## 2018-05-19 DIAGNOSIS — N319 Neuromuscular dysfunction of bladder, unspecified: Secondary | ICD-10-CM | POA: Diagnosis not present

## 2018-05-19 DIAGNOSIS — G35 Multiple sclerosis: Secondary | ICD-10-CM

## 2018-05-19 DIAGNOSIS — G35D Multiple sclerosis, unspecified: Secondary | ICD-10-CM

## 2018-05-19 NOTE — Progress Notes (Signed)
Reason for visit: Sclerosis, paraplegia  Laura Roberts is an 77 y.o. female  History of present illness:  Laura Roberts is a 77 year old right-handed black female with a history of multiple sclerosis associated with the paraplegia.  The patient has essentially no motor or sensory function in the legs.  She requires a motorized wheelchair for mobility.  The patient currently is living in the [facility] following an admission to the hospital for a severe urinary tract infection.  The patient overall is stable with her multiple sclerosis, she does not have good use of the left arm because of a severe degenerative arthritis problem in the left shoulder.  She cannot lift the left arm up.  She has severe pain in the shoulder.  She has seen an orthopedic surgeon, she has gotten an injection with minimal benefit.  The patient has a suprapubic catheter in place, she has not had any recent bladder infections.  The patient does have some spasm in the legs, this is not painful for her, she does take baclofen.  The patient is sleeping fairly well.  She denies any new numbness or weakness or vision changes with her MS.  She is on Tecfidera, she tolerates the medication well.  Past Medical History:  Diagnosis Date  . Age related osteoporosis   . Aphasia   . Constipation   . Cystostomy in place [facility])   . Degenerative arthritis    osteoarthritis   . Depression   . Diabetes mellitus without complication ([place])    type 2   . Full incontinence of feces   . Gait disturbance   . GERD (gastroesophageal reflux disease)   . History of falling   . Hyperlipidemia   . Hypertension   . Multiple sclerosis ([place])   . Neuromuscular disorder (HCC)    Bilateral hand carpal tunnel syndrome  . Neuromuscular dysfunction of bladder   . Nontoxic multinodular goiter   . Obesity   . Obesity   . Other adrenocortical insufficiency ([place])   . Paraplegia ([place])   . Personal history of urinary (tract)  infections   . Polyneuropathy   . Pressure ulcer of right buttock, stage 4 ([place])   . Spinal stenosis in cervical region   . Spinal stenosis of lumbosacral region   . Spinal stenosis, thoracic   . Vitamin D deficiency     Past Surgical History:  Procedure Laterality Date  . ABDOMINAL HYSTERECTOMY    . BACK SURGERY    . BOTOX INJECTION N/A 01/30/2018   Procedure: CYSTOSCOPY BOTOX INJECTION, SUPRAPUBIC EXCHANGE;  Surgeon: Lucas Mallow, MD;  Location: [facility];  Service: Urology;  Laterality: N/A;  . CATARACT EXTRACTION Right   . CHOLECYSTECTOMY    . FEMUR IM NAIL Right 06/02/2014   Procedure: INTRAMEDULLARY (IM) RETROGRADE FEMORAL NAILING;  Surgeon: Johnny Bridge, MD;  Location: [place];  Service: Orthopedics;  Laterality: Right;  . GROIN DISSECTION Left 10/24/2017   Procedure: IRRIGATION AND DEBRIDEMENT, LEFT THIGH ABCESS ;  Surgeon: Alphonsa Overall, MD;  Location: [facility];  Service: General;  Laterality: Left;  . IR CATHETER TUBE CHANGE  10/13/2017  . left hand carpal tunnel surgery    . REPLACEMENT TOTAL KNEE BILATERAL  2004    Family History  Problem Relation Age of Onset  . Multiple sclerosis Other        neices.   . Cancer Mother   . Diabetes Mother   . GI Bleed Sister  diverticulitis  . Thyroid disease Neg Hx     Social history:  reports that she has never smoked. She has never used smokeless tobacco. She reports that she does not drink alcohol or use drugs.    Allergies  Allergen Reactions  . Codeine Other (See Comments)    Difficult breathing and skin problem  . Ultram [Tramadol] Other (See Comments)    Difficult breathing and skin peeling  . Januvia [Sitagliptin] Rash and Other (See Comments)    Blisters    Medications:  Prior to Admission medications   Medication Sig Start Date End Date Taking? Authorizing Provider  acetaminophen (TYLENOL) 325 MG tablet Take 650 mg by mouth every 6 (six) hours as needed for mild pain or fever.    Yes [provider]  Amino Acids-Protein Hydrolys (FEEDING SUPPLEMENT, PRO-STAT SUGAR FREE 64,) LIQD Take 30 mLs by mouth 2 (two) times daily. 11/26/16  Yes Minus Liberty, MD  ARTIFICIAL TEAR OP Apply 1 drop to eye 4 (four) times daily as needed (dry eyes).    Yes [provider]  baclofen (LIORESAL) 10 MG tablet Take 10-20 mg by mouth See admin instructions. Give 1-1/2 tablets to = 15 mg QAM, 1 tablet to = 10 mg midday, take 2 tablets to = 20 mg qhs   Yes [provider]  calcium citrate (CALCITRATE - DOSED IN MG ELEMENTAL CALCIUM) 950 MG tablet Take 200 mg of elemental calcium by mouth daily.   Yes [provider]  Carboxymeth-Glycerin-Polysorb (REFRESH OPTIVE ADVANCED) 0.5-1-0.5 % SOLN Apply to eye.   Yes [provider]  cholecalciferol (VITAMIN D) 1000 UNITS tablet Take 1,000 Units by mouth daily.   Yes [provider]  cycloSPORINE (RESTASIS) 0.05 % ophthalmic emulsion 1 drop 2 (two) times daily.   Yes [provider]  Dimethyl Fumarate (TECFIDERA) 240 MG CPDR Take 1 capsule (240 mg total) by mouth 2 (two) times daily. 01/16/18  Yes Kathrynn Ducking, MD  DULoxetine (CYMBALTA) 30 MG capsule Take 90 mg by mouth daily.    Yes [provider]  gabapentin (NEURONTIN) 300 MG capsule Take 1 capsule (300 mg total) by mouth 2 (two) times daily. 08/18/17  Yes Colbert Ewing, MD  guaiFENesin (MUCINEX) 600 MG 12 hr tablet Take 600 mg by mouth 2 (two) times daily.   Yes [provider]  insulin glargine (LANTUS) 100 UNIT/ML injection Inject 0.2 mLs (20 Units total) into the skin at bedtime. 10/14/17  Yes Katherine Roan, MD  insulin lispro (HUMALOG) 100 UNIT/ML injection Inject 5-10 Units into the skin 3 (three) times daily before meals. If CBG 200-300 = 5 units, 300-400 = 10 units, greater than 400 call provider   Yes [provider]  ipratropium-albuterol (DUONEB) 0.5-2.5 (3) MG/3ML SOLN Take 3 mLs by nebulization.   Yes  [provider]  methimazole (TAPAZOLE) 5 MG tablet Take 1 tablet (5 mg total) by mouth daily. 11/11/17  Yes Renato Shin, MD  Multiple Vitamin (MULTIVITAMIN) tablet Take 1 tablet by mouth daily.   Yes [provider]  NUTRITIONAL SUPPLEMENT LIQD Take 120 mLs by mouth 2 (two) times daily. NSA MedPass    Yes [provider]  pantoprazole (PROTONIX) 40 MG tablet Take 40 mg by mouth daily.   Yes [provider]  polyethylene glycol (MIRALAX / GLYCOLAX) packet Take 17 g by mouth daily as needed for mild constipation.    Yes [provider]  predniSONE (DELTASONE) 5 MG tablet 1 tablet on  odd days, one half tablet on even days 11/22/14  Yes Kathrynn Ducking, MD  senna-docusate (SENOKOT-S) 8.6-50 MG tablet Take 2 tablets by mouth at bedtime.   Yes [provider]  Skin Protectants, Misc. (EUCERIN) cream Apply 1 application topically 2 (two) times daily.   Yes [provider]  tolterodine (DETROL LA) 4 MG 24 hr capsule Take 4 mg by mouth daily.    Yes [provider]    ROS:  Out of a complete 14 system review of symptoms, the patient complains only of the following symptoms, and all other reviewed systems are negative.  Left shoulder pain Weakness  Blood pressure 133/84, pulse 96, resp. rate 18, weight 193 lb (87.5 kg).  Physical Exam  General: The patient is alert and cooperative at the time of the examination.  The patient is moderately obese.  Skin: 2+ edema below the knees is seen bilaterally.   Neurologic Exam  Mental status: The patient is alert and oriented x 3 at the time of the examination. The patient has apparent normal recent and remote memory, with an apparently normal attention span and concentration ability.   Cranial nerves: Facial symmetry is present. Speech is normal, no aphasia or dysarthria is noted. Extraocular movements are full. Visual fields are full.  Pupils are equal, round, and reactive to  light.  Discs are flat bilaterally.  Motor: The patient has good strength of the arms, but the patient is not able to elevate the left arm more than about 15 degrees, she has significant pain in the left shoulder.  Sensory examination: Soft touch sensation is symmetric on the face and arms, she has no sensation in the legs on his side.  Coordination: The patient has good finger-nose-finger with the right arm, she cannot perform well with the left arm, she has no use of the legs.  Gait and station: The patient is wheelchair-bound, she is unable to ambulate.  Reflexes: Deep tendon reflexes are symmetric, but are depressed.    Assessment/Plan:  1.  Multiple sclerosis  2.  Paraplegia  The patient will continue the Tecfidera for now, blood work will be done today.  She will follow-up in 6 months.  She currently is in an extended care facility.  Jill Alexanders MD 05/19/2018 10:24 AM  [facility] [address] [place], [place] [address]  Phone [phone] Fax [phone]

## 2018-05-20 ENCOUNTER — Telehealth: Payer: Self-pay | Admitting: *Deleted

## 2018-05-20 LAB — CBC WITH DIFFERENTIAL/PLATELET
BASOS: 1 %
Basophils Absolute: 0.1 10*3/uL (ref 0.0–0.2)
EOS (ABSOLUTE): 0.4 10*3/uL (ref 0.0–0.4)
EOS: 4 %
HEMATOCRIT: 40.9 % (ref 34.0–46.6)
HEMOGLOBIN: 13.1 g/dL (ref 11.1–15.9)
Immature Grans (Abs): 0.1 10*3/uL (ref 0.0–0.1)
Immature Granulocytes: 1 %
Lymphocytes Absolute: 1.6 10*3/uL (ref 0.7–3.1)
Lymphs: 16 %
MCH: 27.1 pg (ref 26.6–33.0)
MCHC: 32 g/dL (ref 31.5–35.7)
MCV: 85 fL (ref 79–97)
MONOCYTES: 7 %
Monocytes Absolute: 0.7 10*3/uL (ref 0.1–0.9)
NEUTROS ABS: 7 10*3/uL (ref 1.4–7.0)
Neutrophils: 71 %
Platelets: 317 10*3/uL (ref 150–450)
RBC: 4.83 x10E6/uL (ref 3.77–5.28)
RDW: 16.2 % — ABNORMAL HIGH (ref 12.3–15.4)
WBC: 9.8 10*3/uL (ref 3.4–10.8)

## 2018-05-20 LAB — COMPREHENSIVE METABOLIC PANEL
ALBUMIN: 4.3 g/dL (ref 3.5–4.8)
ALK PHOS: 96 IU/L (ref 39–117)
ALT: 15 IU/L (ref 0–32)
AST: 11 IU/L (ref 0–40)
Albumin/Globulin Ratio: 1.7 (ref 1.2–2.2)
BUN/Creatinine Ratio: 28 (ref 12–28)
BUN: 21 mg/dL (ref 8–27)
Bilirubin Total: 0.3 mg/dL (ref 0.0–1.2)
CALCIUM: 10 mg/dL (ref 8.7–10.3)
CO2: 26 mmol/L (ref 20–29)
CREATININE: 0.74 mg/dL (ref 0.57–1.00)
Chloride: 98 mmol/L (ref 96–106)
GFR, EST AFRICAN AMERICAN: 92 mL/min/{1.73_m2} (ref >59)
GFR, EST NON AFRICAN AMERICAN: 80 mL/min/{1.73_m2} (ref >59)
GLOBULIN, TOTAL: 2.5 g/dL (ref 1.5–4.5)
GLUCOSE: 149 mg/dL — AB (ref 65–99)
Potassium: 4.5 mmol/L (ref 3.5–5.2)
SODIUM: 143 mmol/L (ref 134–144)
TOTAL PROTEIN: 6.8 g/dL (ref 6.0–8.5)

## 2018-05-20 NOTE — Telephone Encounter (Signed)
-----   Message from Kathrynn Ducking, MD sent at 05/20/2018  7:23 AM EST -----   The blood work results are unremarkable. Resolution of prior anemia. Please call the patient. ----- Message ----- From: Lavone Neri Lab Results In Sent: 05/20/2018   5:39 AM EST To: Kathrynn Ducking, MD

## 2018-05-20 NOTE — Telephone Encounter (Signed)
LMOM with below lab results. Pt. does not need to return this call unless she has questions/fim

## 2018-06-03 ENCOUNTER — Non-Acute Institutional Stay: Payer: Medicare Other | Admitting: Primary Care

## 2018-06-03 DIAGNOSIS — N319 Neuromuscular dysfunction of bladder, unspecified: Secondary | ICD-10-CM

## 2018-06-03 DIAGNOSIS — G35 Multiple sclerosis: Secondary | ICD-10-CM

## 2018-06-03 DIAGNOSIS — M62838 Other muscle spasm: Secondary | ICD-10-CM

## 2018-06-03 DIAGNOSIS — Z515 Encounter for palliative care: Secondary | ICD-10-CM

## 2018-06-03 NOTE — Progress Notes (Signed)
[facility] Telephone: [phone] Fax: [phone]  PATIENT NAME: Laura Roberts DOB: Feb 18, 1941 MRN: [id number]  PRIMARY CARE PROVIDER:   [name], MD  REFERRING PROVIDER:  [name], [place], [place] [address]  RESPONSIBLE PARTY : Extended Emergency Contact Information Primary Emergency Contact: Mcfall,Leroy III          [place], [place] Phone: [phone] Relation: Son Secondary Emergency Contact: Salli Quarry [place] Phone: [phone] Mobile Phone: [phone] Relation: Son   ASSESSMENT and RECOMMENDATIONS:   1. Pain: Well managed on current regimen. Using antispasmodics and gabapentin for pain/ spasm control. States she pushes through not feeling well some days to get up and help others. Taking Tecfidera which is also helping.Saw neurology recently with good report.  2. Goals of Care: Has DNR, wanted to complete MOST form with DNR, limited hospitalization, IV fluids, ABX and no feeding tube. Completed today and put on facility chart. States she has discussed her wishes with her sons. Expressed worry at financial drain of SNF.  Palliative to continue to follow for symptom management and goals of care clarification. Return 1-2 mos.  I spent 25 minutes providing this consultation,  from 1430 to 1455. More than 50% of the time in this consultation was spent coordinating communication.   HISTORY OF PRESENT ILLNESS:  Laura Roberts is a 77 y.o. year old female with multiple medical problems including MS, paraplegia, depression, h/o fracture, GED, DM2,  HTN, neurogenic pain of LE, suprapubic urinary catheter.Palliative Care was asked to help address goals of care.   CODE STATUS: DNR, MOST with limited hospital, IV, ABX, no feeding tube  PPS: 40% HOSPICE ELIGIBILITY/DIAGNOSIS: TBD  PAST MEDICAL HISTORY:  Past Medical History:  Diagnosis Date  . Age related  osteoporosis   . Aphasia   . Constipation   . Cystostomy in place [facility])   . Degenerative arthritis    osteoarthritis   . Depression   . Diabetes mellitus without complication ([place])    type 2   . Full incontinence of feces   . Gait disturbance   . GERD (gastroesophageal reflux disease)   . History of falling   . Hyperlipidemia   . Hypertension   . Multiple sclerosis ([place])   . Neuromuscular disorder (HCC)    Bilateral hand carpal tunnel syndrome  . Neuromuscular dysfunction of bladder   . Nontoxic multinodular goiter   . Obesity   . Obesity   . Other adrenocortical insufficiency ([place])   . Paraplegia ([place])   . Personal history of urinary (tract) infections   . Polyneuropathy   . Pressure ulcer of right buttock, stage 4 ([place])   . Spinal stenosis in cervical region   . Spinal stenosis of lumbosacral region   . Spinal stenosis, thoracic   . Vitamin D deficiency     SOCIAL HX:  Social History   Tobacco Use  . Smoking status: Never Smoker  . Smokeless tobacco: Never Used  Substance Use Topics  . Alcohol use: No    ALLERGIES:  Allergies  Allergen Reactions  . Codeine Other (See Comments)    Difficult breathing and skin problem  . Ultram [Tramadol] Other (See Comments)    Difficult breathing and skin peeling  . Januvia [Sitagliptin] Rash and Other (See Comments)    Blisters     PERTINENT MEDICATIONS:  Outpatient Encounter Medications as of 06/03/2018  Medication Sig  . acetaminophen (TYLENOL) 325 MG  tablet Take 650 mg by mouth every 6 (six) hours as needed for mild pain or fever.   . Amino Acids-Protein Hydrolys (FEEDING SUPPLEMENT, PRO-STAT SUGAR FREE 64,) LIQD Take 30 mLs by mouth 2 (two) times daily.  . ARTIFICIAL TEAR OP Apply 1 drop to eye 4 (four) times daily as needed (dry eyes).   . baclofen (LIORESAL) 10 MG tablet Take 10-20 mg by mouth See admin instructions. Give 1-1/2 tablets to = 15 mg QAM, 1 tablet to = 10 mg midday, take 2 tablets to = 20 mg qhs  .  calcium citrate (CALCITRATE - DOSED IN MG ELEMENTAL CALCIUM) 950 MG tablet Take 200 mg of elemental calcium by mouth daily.  . Carboxymeth-Glycerin-Polysorb (REFRESH OPTIVE ADVANCED) 0.5-1-0.5 % SOLN Apply to eye.  . cholecalciferol (VITAMIN D) 1000 UNITS tablet Take 1,000 Units by mouth daily.  . cycloSPORINE (RESTASIS) 0.05 % ophthalmic emulsion 1 drop 2 (two) times daily.  . Dimethyl Fumarate (TECFIDERA) 240 MG CPDR Take 1 capsule (240 mg total) by mouth 2 (two) times daily.  . DULoxetine (CYMBALTA) 30 MG capsule Take 90 mg by mouth daily.   Marland Kitchen gabapentin (NEURONTIN) 300 MG capsule Take 1 capsule (300 mg total) by mouth 2 (two) times daily.  Marland Kitchen guaiFENesin (MUCINEX) 600 MG 12 hr tablet Take 600 mg by mouth 2 (two) times daily.  . insulin glargine (LANTUS) 100 UNIT/ML injection Inject 0.2 mLs (20 Units total) into the skin at bedtime.  . insulin lispro (HUMALOG) 100 UNIT/ML injection Inject 5-10 Units into the skin 3 (three) times daily before meals. If CBG 200-300 = 5 units, 300-400 = 10 units, greater than 400 call provider  . ipratropium-albuterol (DUONEB) 0.5-2.5 (3) MG/3ML SOLN Take 3 mLs by nebulization.  . methimazole (TAPAZOLE) 5 MG tablet Take 1 tablet (5 mg total) by mouth daily.  . Multiple Vitamin (MULTIVITAMIN) tablet Take 1 tablet by mouth daily.  Marland Kitchen NUTRITIONAL SUPPLEMENT LIQD Take 120 mLs by mouth 2 (two) times daily. NSA MedPass   . pantoprazole (PROTONIX) 40 MG tablet Take 40 mg by mouth daily.  . polyethylene glycol (MIRALAX / GLYCOLAX) packet Take 17 g by mouth daily as needed for mild constipation.   . predniSONE (DELTASONE) 5 MG tablet 1 tablet on odd days, one half tablet on even days  . senna-docusate (SENOKOT-S) 8.6-50 MG tablet Take 2 tablets by mouth at bedtime.  . Skin Protectants, Misc. (EUCERIN) cream Apply 1 application topically 2 (two) times daily.  Marland Kitchen tolterodine (DETROL LA) 4 MG 24 hr capsule Take 4 mg by mouth daily.    No facility-administered encounter  medications on file as of 06/03/2018.     PHYSICAL EXAM:  VS 139/81 98.3-98-18 95% General: NAD, frail appearing,obese  Cardiovascular: regular rate and rhythm, S1S2 Pulmonary: clear all fields, no cough   Abdomen: soft, nontender, + bowel sounds, good appetite GU: no suprapubic tenderness, suprapubic cath in place Extremities: no edema, limb spacicity Skin: no rashes, wounds reported Neurological: Weakness, paraplegia from MS, oriented x 4.   Cyndia Skeeters DNP , AGPCNP-BC

## 2018-06-04 ENCOUNTER — Non-Acute Institutional Stay (SKILLED_NURSING_FACILITY): Payer: Medicare Other | Admitting: Internal Medicine

## 2018-06-04 ENCOUNTER — Encounter: Payer: Self-pay | Admitting: Internal Medicine

## 2018-06-04 DIAGNOSIS — E0590 Thyrotoxicosis, unspecified without thyrotoxic crisis or storm: Secondary | ICD-10-CM | POA: Diagnosis not present

## 2018-06-04 DIAGNOSIS — E119 Type 2 diabetes mellitus without complications: Secondary | ICD-10-CM

## 2018-06-04 DIAGNOSIS — N319 Neuromuscular dysfunction of bladder, unspecified: Secondary | ICD-10-CM | POA: Diagnosis not present

## 2018-06-04 DIAGNOSIS — G35 Multiple sclerosis: Secondary | ICD-10-CM | POA: Diagnosis not present

## 2018-06-04 DIAGNOSIS — Z794 Long term (current) use of insulin: Secondary | ICD-10-CM

## 2018-06-04 NOTE — Assessment & Plan Note (Addendum)
04/15/2018 Free T4 and TSH therapeutic, no change in methimazole dose CBC and differential were normal 11/19 except for minimally increased RDW

## 2018-06-04 NOTE — Assessment & Plan Note (Addendum)
Random glucose was 149; A1c needs update Her A1c goal would be less than 8%

## 2018-06-04 NOTE — Assessment & Plan Note (Addendum)
Neurology follow-up is current (05/19/18); patient felt to be stable on Tecfidera Follow-up recommended in 6 months

## 2018-06-04 NOTE — Progress Notes (Signed)
    NURSING HOME LOCATION:  [facility] ROOM NUMBER:  [id number]  CODE STATUS:  DNR  PCP:  [name], MD  [place] [address]  This is a nursing facility follow up of chronic medical diagnoses.  Interim medical record and care since last [place] visit was updated with review of diagnostic studies and change in clinical status since last visit were documented.  HPI:  She saw Dr. Jannifer Franklin in follow-up for her MS complicated by paraplegia and neurogenic bladder on 05/19/2018.  Tecfidera was continued and follow-up was recommended in 6 months.  Extensive labs were done at that time.  All values were normal except for glucose of 149.  The most recent A1c was 7.5% on 02/06/2018. Urologic follow-up was completed by Dr. Gloriann Loan ,[facility] and urinary catheter malfunction addressed satisfactorily. She was seen by Dr. Loanne Drilling 04/15/2018 for hyperthyroidism with multinodular goiter.  Follow-up ultrasound was not felt to be necessary. Methimazole was continued; T4 and TSH were therapeutic.  Review of systems: Her only complaint is pain related to her MS, described as diffuse but tolerable.  She says that this pain is "body wide" but especially in the hands and left shoulder.  Constitutional: No fever, significant weight change, fatigue  Eyes: No redness, discharge, pain, vision change ENT/mouth: No nasal congestion,  purulent discharge, earache, change in hearing, sore throat  Cardiovascular: No chest pain, palpitations, paroxysmal nocturnal dyspnea, claudication, edema  Respiratory: No cough, sputum production, hemoptysis, DOE, significant snoring, apnea   Gastrointestinal: No heartburn, dysphagia, abdominal pain, nausea /vomiting, rectal bleeding, melena, change in bowels Genitourinary: No dysuria, hematuria, pyuria, incontinence, nocturia Dermatologic: No rash, pruritus, change in appearance of skin Neurologic: No dizziness, headache, syncope, seizures,   Psychiatric: No significant anxiety, depression, insomnia, anorexia Endocrine: No change in hair/skin/nails, excessive thirst, excessive hunger, excessive urination  Hematologic/lymphatic: No significant bruising, lymphadenopathy, abnormal bleeding Allergy/immunology: No itchy/watery eyes, significant sneezing, urticaria, angioedema  Physical exam:  Pertinent or positive findings: She is morbidly obese.  She sits in the wheelchair.  Arcus senilis is present.  Very subtle hirsutism is present over the chin.  She exhibits a resting slight tachycardia.  Abdomen is protuberant.  Pedal pulses are decreased.  She has nonpitting edema.  Pedal pulses are decreased.  The left upper extremity is weaker than the right.  She has no range of motion or muscular activity of the lower extremities.  General appearance: Adequately nourished; no acute distress, increased work of breathing is present.   Lymphatic: No lymphadenopathy about the head, neck, axilla. Eyes: No conjunctival inflammation or lid edema is present. There is no scleral icterus. Ears:  External ear exam shows no significant lesions or deformities.   Nose:  External nasal examination shows no deformity or inflammation. Nasal mucosa are pink and moist without lesions, exudates Oral exam:  Lips and gums are healthy appearing. There is no oropharyngeal erythema or exudate. Neck:  No thyromegaly, masses, tenderness noted.    Heart:  Regular rhythm. S1 and S2 normal without gallop, murmur, click, rub .  Lungs: Chest clear to auscultation without wheezes, rhonchi, rales, rubs. Abdomen: Bowel sounds are normal. Abdomen is soft and nontender with no organomegaly, hernias, masses. GU: Deferred  Extremities:  No cyanosis, clubbing  Skin: Warm & dry w/o tenting. No significant lesions or rash.  See summary under each active problem in the Problem List with associated updated therapeutic plan

## 2018-06-05 LAB — HEMOGLOBIN A1C: Hgb A1c MFr Bld: 7.9 — AB (ref 4.0–6.0)

## 2018-06-06 ENCOUNTER — Encounter: Payer: Self-pay | Admitting: Internal Medicine

## 2018-06-06 NOTE — Patient Instructions (Signed)
See assessment and plan under each diagnosis in the problem list and acutely for this visit 

## 2018-06-06 NOTE — Assessment & Plan Note (Signed)
06/04/18 she is asymptomatic

## 2018-07-07 ENCOUNTER — Other Ambulatory Visit (HOSPITAL_COMMUNITY): Payer: Self-pay | Admitting: Urology

## 2018-07-07 DIAGNOSIS — N312 Flaccid neuropathic bladder, not elsewhere classified: Secondary | ICD-10-CM

## 2018-07-13 ENCOUNTER — Telehealth: Payer: Self-pay

## 2018-07-13 NOTE — Telephone Encounter (Signed)
Received fax from [facility] stating Tecfidera 240 mg was shipped to the pt on 07/06/2018.

## 2018-07-14 ENCOUNTER — Other Ambulatory Visit: Payer: Self-pay | Admitting: Radiology

## 2018-07-15 ENCOUNTER — Other Ambulatory Visit (HOSPITAL_COMMUNITY): Payer: Self-pay | Admitting: Urology

## 2018-07-15 ENCOUNTER — Other Ambulatory Visit (HOSPITAL_COMMUNITY): Payer: Self-pay | Admitting: Diagnostic Radiology

## 2018-07-15 ENCOUNTER — Ambulatory Visit (HOSPITAL_COMMUNITY)
Admission: RE | Admit: 2018-07-15 | Discharge: 2018-07-15 | Disposition: A | Discharge status: Home / Self Care | Payer: Medicare Other | Source: Ambulatory Visit | Attending: Urology | Admitting: Urology

## 2018-07-15 ENCOUNTER — Encounter (HOSPITAL_COMMUNITY): Payer: Self-pay | Admitting: *Deleted

## 2018-07-15 ENCOUNTER — Other Ambulatory Visit: Payer: Self-pay

## 2018-07-15 DIAGNOSIS — K219 Gastro-esophageal reflux disease without esophagitis: Secondary | ICD-10-CM | POA: Insufficient documentation

## 2018-07-15 DIAGNOSIS — E119 Type 2 diabetes mellitus without complications: Secondary | ICD-10-CM | POA: Diagnosis not present

## 2018-07-15 DIAGNOSIS — Z79899 Other long term (current) drug therapy: Secondary | ICD-10-CM | POA: Diagnosis not present

## 2018-07-15 DIAGNOSIS — F329 Major depressive disorder, single episode, unspecified: Secondary | ICD-10-CM | POA: Insufficient documentation

## 2018-07-15 DIAGNOSIS — G35 Multiple sclerosis: Secondary | ICD-10-CM | POA: Diagnosis not present

## 2018-07-15 DIAGNOSIS — N312 Flaccid neuropathic bladder, not elsewhere classified: Secondary | ICD-10-CM

## 2018-07-15 DIAGNOSIS — Z794 Long term (current) use of insulin: Secondary | ICD-10-CM | POA: Insufficient documentation

## 2018-07-15 DIAGNOSIS — Z466 Encounter for fitting and adjustment of urinary device: Secondary | ICD-10-CM | POA: Diagnosis not present

## 2018-07-15 DIAGNOSIS — G8220 Paraplegia, unspecified: Secondary | ICD-10-CM | POA: Diagnosis not present

## 2018-07-15 DIAGNOSIS — I10 Essential (primary) hypertension: Secondary | ICD-10-CM | POA: Diagnosis not present

## 2018-07-15 DIAGNOSIS — M810 Age-related osteoporosis without current pathological fracture: Secondary | ICD-10-CM | POA: Diagnosis not present

## 2018-07-15 DIAGNOSIS — Z7401 Bed confinement status: Secondary | ICD-10-CM | POA: Diagnosis not present

## 2018-07-15 DIAGNOSIS — M2550 Pain in unspecified joint: Secondary | ICD-10-CM | POA: Diagnosis not present

## 2018-07-15 DIAGNOSIS — E785 Hyperlipidemia, unspecified: Secondary | ICD-10-CM | POA: Diagnosis not present

## 2018-07-15 HISTORY — PX: IR CATHETER TUBE CHANGE: IMG717

## 2018-07-15 LAB — CBC WITH DIFFERENTIAL/PLATELET
Abs Immature Granulocytes: 0.11 10*3/uL — ABNORMAL HIGH (ref 0.00–0.07)
Basophils Absolute: 0 10*3/uL (ref 0.0–0.1)
Basophils Relative: 0 %
Eosinophils Absolute: 0.4 10*3/uL (ref 0.0–0.5)
Eosinophils Relative: 3 %
HCT: 39.2 % (ref 36.0–46.0)
Hemoglobin: 12.1 g/dL (ref 12.0–15.0)
Immature Granulocytes: 1 %
LYMPHS PCT: 18 %
Lymphs Abs: 2.1 10*3/uL (ref 0.7–4.0)
MCH: 27.4 pg (ref 26.0–34.0)
MCHC: 30.9 g/dL (ref 30.0–36.0)
MCV: 88.9 fL (ref 80.0–100.0)
Monocytes Absolute: 0.7 10*3/uL (ref 0.1–1.0)
Monocytes Relative: 6 %
Neutro Abs: 8.3 10*3/uL — ABNORMAL HIGH (ref 1.7–7.7)
Neutrophils Relative %: 72 %
Platelets: 455 10*3/uL — ABNORMAL HIGH (ref 150–400)
RBC: 4.41 MIL/uL (ref 3.87–5.11)
RDW: 16.5 % — ABNORMAL HIGH (ref 11.5–15.5)
WBC: 11.7 10*3/uL — AB (ref 4.0–10.5)
nRBC: 0 % (ref 0.0–0.2)

## 2018-07-15 LAB — BASIC METABOLIC PANEL
Anion gap: 11 (ref 5–15)
BUN: 25 mg/dL — ABNORMAL HIGH (ref 8–23)
CO2: 31 mmol/L (ref 22–32)
Calcium: 9.6 mg/dL (ref 8.9–10.3)
Chloride: 98 mmol/L (ref 98–111)
Creatinine, Ser: 0.63 mg/dL (ref 0.44–1.00)
GFR calc Af Amer: >60 mL/min (ref >60)
GLUCOSE: 105 mg/dL — AB (ref 70–99)
Potassium: 4.9 mmol/L (ref 3.5–5.1)
Sodium: 140 mmol/L (ref 135–145)

## 2018-07-15 LAB — PROTIME-INR
INR: 1
Prothrombin Time: 13.1 seconds (ref 11.4–15.2)

## 2018-07-15 LAB — GLUCOSE, CAPILLARY: Glucose-Capillary: 83 mg/dL (ref 70–99)

## 2018-07-15 MED ORDER — MIDAZOLAM HCL 2 MG/2ML IJ SOLN
INTRAMUSCULAR | Status: AC
  Filled 2018-07-15: qty 4

## 2018-07-15 MED ORDER — FENTANYL CITRATE (PF) 100 MCG/2ML IJ SOLN
INTRAMUSCULAR | Status: AC
  Filled 2018-07-15: qty 4

## 2018-07-15 MED ORDER — LIDOCAINE VISCOUS HCL 2 % MT SOLN
OROMUCOSAL | Status: AC
  Administered 2018-07-15: 7 mL
  Filled 2018-07-15: qty 15

## 2018-07-15 MED ORDER — LIDOCAINE HCL 1 % IJ SOLN
INTRAMUSCULAR | Status: AC
  Filled 2018-07-15: qty 20

## 2018-07-15 MED ORDER — IOPAMIDOL (ISOVUE-300) INJECTION 61%
INTRAVENOUS | Status: AC
  Administered 2018-07-15: 10 mL
  Filled 2018-07-15: qty 50

## 2018-07-15 MED ORDER — CEFAZOLIN SODIUM-DEXTROSE 2-4 GM/100ML-% IV SOLN
2.0000 g | INTRAVENOUS | Status: AC
  Administered 2018-07-15: 2 g via INTRAVENOUS

## 2018-07-15 MED ORDER — FENTANYL CITRATE (PF) 100 MCG/2ML IJ SOLN
INTRAMUSCULAR | Status: AC | PRN
  Administered 2018-07-15: 50 ug via INTRAVENOUS

## 2018-07-15 MED ORDER — NALOXONE HCL 0.4 MG/ML IJ SOLN
INTRAMUSCULAR | Status: AC
  Filled 2018-07-15: qty 1

## 2018-07-15 MED ORDER — FLUMAZENIL 0.5 MG/5ML IV SOLN
INTRAVENOUS | Status: AC
  Filled 2018-07-15: qty 5

## 2018-07-15 MED ORDER — MIDAZOLAM HCL 2 MG/2ML IJ SOLN
INTRAMUSCULAR | Status: AC | PRN
  Administered 2018-07-15: 1 mg via INTRAVENOUS

## 2018-07-15 MED ORDER — CEFAZOLIN SODIUM-DEXTROSE 2-4 GM/100ML-% IV SOLN
INTRAVENOUS | Status: AC
  Administered 2018-07-15: 2 g via INTRAVENOUS
  Filled 2018-07-15: qty 100

## 2018-07-15 MED ORDER — SODIUM CHLORIDE 0.9 % IV SOLN
INTRAVENOUS | Status: DC
  Administered 2018-07-15: 09:00:00 via INTRAVENOUS

## 2018-07-15 NOTE — Procedures (Signed)
Interventional Radiology Procedure:   Indications: Dislodged suprapubic catheter and neurogenic bladder  Procedure: Replacement of suprapubic catheter with fluoroscopy  Findings: Successful replacement of catheter.  14 Fr balloon retention catheter in place.   Complications: None     EBL: Minimal  Plan: Upsize or change per Urology.     Jonisha Kindig R. Anselm Pancoast, MD  Pager: [phone]

## 2018-07-15 NOTE — Progress Notes (Signed)
D/c'd foley cath per MD order w/o difficulty

## 2018-07-15 NOTE — Discharge Instructions (Signed)
Bone Marrow Aspiration and Bone Marrow Biopsy, Adult, Care After °This sheet gives you information about how to care for yourself after your procedure. Your health care provider may also give you more specific instructions. If you have problems or questions, contact your health care provider. °What can I expect after the procedure? °After the procedure, it is common to have: °· Mild pain and tenderness. °· Swelling. °· Bruising. °Follow these instructions at home: °Puncture site care ° °  ° °· Follow instructions from your health care provider about how to take care of the puncture site. Make sure you: °? Wash your hands with soap and water before you change your bandage (dressing). If soap and water are not available, use hand sanitizer. °? Change your dressing as told by your health care provider. °· Check your puncture site every day for signs of infection. Check for: °? More redness, swelling, or pain. °? More fluid or blood. °? Warmth. °? Pus or a bad smell. °General instructions °· Take over-the-counter and prescription medicines only as told by your health care provider. °· Do not take baths, swim, or use a hot tub until your health care provider approves. Ask if you can take a shower or have a sponge bath. °· Return to your normal activities as told by your health care provider. Ask your health care provider what activities are safe for you. °· Do not drive for 24 hours if you were given a medicine to help you relax (sedative) during your procedure. °· Keep all follow-up visits as told by your health care provider. This is important. °Contact a health care provider if: °· Your pain is not controlled with medicine. °Get help right away if: °· You have a fever. °· You have more redness, swelling, or pain around the puncture site. °· You have more fluid or blood coming from the puncture site. °· Your puncture site feels warm to the touch. °· You have pus or a bad smell coming from the puncture site. °These  symptoms may represent a serious problem that is an emergency. Do not wait to see if the symptoms will go away. Get medical help right away. Call your local emergency services (911 in the [place] Do not drive yourself to the hospital. °Summary °· After the procedure, it is common to have mild pain, tenderness, swelling, and bruising. °· Follow instructions from your health care provider about how to take care of the puncture site. °· Get help right away if you have any symptoms of infection or if you have more blood or fluid coming from the puncture site. °This information is not intended to replace advice given to you by your health care provider. Make sure you discuss any questions you have with your health care provider. °Document Released: 01/04/2005 Document Revised: 09/30/2017 Document Reviewed: 11/29/2015 °Elsevier Interactive Patient Education © 2019 [employer] Inc[employer] ° ° ° °Moderate Conscious Sedation, Adult, Care After °These instructions provide you with information about caring for yourself after your procedure. Your health care provider may also give you more specific instructions. Your treatment has been planned according to current medical practices, but problems sometimes occur. Call your health care provider if you have any problems or questions after your procedure. °What can I expect after the procedure? °After your procedure, it is common: °· To feel sleepy for several hours. °· To feel clumsy and have poor balance for several hours. °· To have poor judgment for several hours. °· To vomit if you eat too soon. °  Follow these instructions at home: °For at least 24 hours after the procedure: ° °· Do not: °? Participate in activities where you could fall or become injured. °? Drive. °? Use heavy machinery. °? Drink alcohol. °? Take sleeping pills or medicines that cause drowsiness. °? Make important decisions or sign legal documents. °? Take care of children on your own. °· Rest. °Eating and  drinking °· Follow the diet recommended by your health care provider. °· If you vomit: °? Drink water, juice, or soup when you can drink without vomiting. °? Make sure you have little or no nausea before eating solid foods. °General instructions °· Have a responsible adult stay with you until you are awake and alert. °· Take over-the-counter and prescription medicines only as told by your health care provider. °· If you smoke, do not smoke without supervision. °· Keep all follow-up visits as told by your health care provider. This is important. °Contact a health care provider if: °· You keep feeling nauseous or you keep vomiting. °· You feel light-headed. °· You develop a rash. °· You have a fever. °Get help right away if: °· You have trouble breathing. °This information is not intended to replace advice given to you by your health care provider. Make sure you discuss any questions you have with your health care provider. °Document Released: 04/07/2013 Document Revised: 11/20/2015 Document Reviewed: 10/07/2015 °Elsevier Interactive Patient Education © 2019 Elsevier Inc. ° °

## 2018-07-15 NOTE — H&P (Signed)
Chief Complaint: Patient was seen in consultation today for No chief complaint on file.  at the request of Bell,[name]  Referring Physician(s): [name]  Supervising Physician: Markus Daft  Patient Status: [facility] - Out-pt  History of Present Illness: Laura Roberts is a 78 y.o. female with hx of MS and neurogenic. She has had a suprapubic catheter for long time but recently it came out and was not able to be replaced before the tract closed up. She is referred for image guided placement of new SP catheter. PMHx, meds, labs, imaging, allergies reviewed. Feels well, no recent fevers, chills, illness. She currently has foley in placeHas been NPO today as directed.   Past Medical History:  Diagnosis Date  . Age related osteoporosis   . Aphasia   . Constipation   . Cystostomy in place [facility])   . Degenerative arthritis    osteoarthritis   . Depression   . Diabetes mellitus without complication ([place])    type 2   . Full incontinence of feces   . Gait disturbance   . GERD (gastroesophageal reflux disease)   . History of falling   . Hyperlipidemia   . Hypertension   . Multiple sclerosis ([place])   . Neuromuscular disorder ([facility])    Bilateral hand carpal tunnel syndrome  . Neuromuscular dysfunction of bladder   . Nontoxic multinodular goiter   . Obesity   . Obesity   . Other adrenocortical insufficiency ([place])   . Paraplegia ([place])   . Personal history of urinary (tract) infections   . Polyneuropathy   . Pressure ulcer of right buttock, stage 4 ([place])   . Spinal stenosis in cervical region   . Spinal stenosis of lumbosacral region   . Spinal stenosis, thoracic   . Vitamin D deficiency     Past Surgical History:  Procedure Laterality Date  . ABDOMINAL HYSTERECTOMY    . BACK SURGERY    . BOTOX INJECTION N/A 01/30/2018   Procedure: CYSTOSCOPY BOTOX INJECTION, SUPRAPUBIC EXCHANGE;  Surgeon: Lucas Mallow, MD;  Location: [facility];  Service: Urology;   Laterality: N/A;  . CATARACT EXTRACTION Right   . CHOLECYSTECTOMY    . FEMUR IM NAIL Right 06/02/2014   Procedure: INTRAMEDULLARY (IM) RETROGRADE FEMORAL NAILING;  Surgeon: Johnny Bridge, MD;  Location: [facility];  Service: Orthopedics;  Laterality: Right;  . GROIN DISSECTION Left 10/24/2017   Procedure: IRRIGATION AND DEBRIDEMENT, LEFT THIGH ABCESS ;  Surgeon: Alphonsa Overall, MD;  Location: [facility];  Service: General;  Laterality: Left;  . IR CATHETER TUBE CHANGE  10/13/2017  . left hand carpal tunnel surgery    . REPLACEMENT TOTAL KNEE BILATERAL  2004    Allergies: Codeine; Ultram [tramadol]; and Januvia [sitagliptin]  Medications: Prior to Admission medications   Medication Sig Start Date End Date Taking? Authorizing Provider  acetaminophen (TYLENOL) 325 MG tablet Take 650 mg by mouth every 6 (six) hours as needed for mild pain or fever.    Yes [provider]  Amino Acids-Protein Hydrolys (FEEDING SUPPLEMENT, PRO-STAT SUGAR FREE 64,) LIQD Take 30 mLs by mouth 2 (two) times daily. 11/26/16  Yes Minus Liberty, MD  ARTIFICIAL TEAR OP Apply 1 drop to eye 4 (four) times daily as needed (dry eyes).    Yes [provider]  baclofen (LIORESAL) 10 MG tablet Take 10-20 mg by mouth See admin instructions. Give 1-1/2 tablets to = 15 mg QAM, 1 tablet to = 10 mg midday, take  2 tablets to = 20 mg qhs   Yes [provider]  calcium citrate (CALCITRATE - DOSED IN MG ELEMENTAL CALCIUM) 950 MG tablet Take 200 mg of elemental calcium by mouth daily.   Yes [provider]  Carboxymeth-Glycerin-Polysorb (REFRESH OPTIVE ADVANCED) 0.5-1-0.5 % SOLN Apply 1 drop to eye 4 (four) times daily.    Yes [provider]  cholecalciferol (VITAMIN D) 1000 UNITS tablet Take 1,000 Units by mouth daily.   Yes [provider]  cycloSPORINE (RESTASIS) 0.05 % ophthalmic emulsion 1 drop 2 (two) times daily.   Yes [provider]  Dimethyl Fumarate (TECFIDERA) 240 MG  CPDR Take 1 capsule (240 mg total) by mouth 2 (two) times daily. 01/16/18  Yes Kathrynn Ducking, MD  DULoxetine (CYMBALTA) 30 MG capsule Take 90 mg by mouth daily.    Yes [provider]  gabapentin (NEURONTIN) 300 MG capsule Take 1 capsule (300 mg total) by mouth 2 (two) times daily. 08/18/17  Yes Colbert Ewing, MD  guaiFENesin (MUCINEX) 600 MG 12 hr tablet Take 600 mg by mouth 2 (two) times daily.    Yes [provider]  insulin glargine (LANTUS) 100 UNIT/ML injection Inject 0.2 mLs (20 Units total) into the skin at bedtime. 10/14/17  Yes Katherine Roan, MD  insulin lispro (HUMALOG) 100 UNIT/ML injection Inject 5-10 Units into the skin 3 (three) times daily before meals. If CBG 200-300 = 5 units, 300-400 = 10 units, greater than 400 call provider   Yes [provider]  ipratropium-albuterol (DUONEB) 0.5-2.5 (3) MG/3ML SOLN Take 3 mLs by nebulization.   Yes [provider]  methimazole (TAPAZOLE) 5 MG tablet Take 1 tablet (5 mg total) by mouth daily. 11/11/17  Yes Renato Shin, MD  Multiple Vitamin (MULTIVITAMIN) tablet Take 1 tablet by mouth daily.   Yes [provider]  pantoprazole (PROTONIX) 40 MG tablet Take 40 mg by mouth daily.   Yes [provider]  polyethylene glycol (MIRALAX / GLYCOLAX) packet Take 17 g by mouth daily as needed for mild constipation.    Yes [provider]  predniSONE (DELTASONE) 5 MG tablet 1 tablet on odd days, one half tablet on even days 11/22/14  Yes Kathrynn Ducking, MD  senna-docusate (SENOKOT-S) 8.6-50 MG tablet Take 2 tablets by mouth at bedtime.   Yes [provider]  Skin Protectants, Misc. (EUCERIN) cream Apply 1 application topically 2 (two) times daily.   Yes [provider]  tolterodine (DETROL LA) 4 MG 24 hr capsule Take 4 mg by mouth daily.    Yes [provider]     Family History  Problem Relation Age of Onset  . Multiple sclerosis Other        neices.   .  Cancer Mother   . Diabetes Mother   . GI Bleed Sister        diverticulitis  . Thyroid disease Neg Hx     Social History   Socioeconomic History  . Marital status: Widowed    Spouse name: Not on file  . Number of children: 2  . Years of education: 55yr colleg  . Highest education level: Not on file  Occupational History  . Occupation: N/A    Employer: [employer]    Comment: N[employer]  Social Needs  . Financial resource strain: Not hard at all  . Food insecurity:    Worry: Never true    Inability: Never true  . Transportation needs:  Medical: No    Non-medical: No  Tobacco Use  . Smoking status: Never Smoker  . Smokeless tobacco: Never Used  Substance and Sexual Activity  . Alcohol use: No  . Drug use: No  . Sexual activity: Never  Lifestyle  . Physical activity:    Days per week: 7 days    Minutes per session: 30 min  . Stress: Rather much  Relationships  . Social connections:    Talks on phone: More than three times a week    Gets together: More than three times a week    Attends religious service: Never    Active member of club or organization: No    Attends meetings of clubs or organizations: Never    Relationship status: Widowed  Other Topics Concern  . Not on file  Social History Narrative   Patient is widowed with 2 children   Patient is right handed   Patient has 2 yrs of college   Patient drinks 2-3 cups of tea daily     Review of Systems: A 12 point ROS discussed and pertinent positives are indicated in the HPI above.  All other systems are negative.  Review of Systems  Vital Signs: BP (!) 164/95   Pulse 92   Temp 98 F (36.7 C) (Oral)   Resp 16   Ht 5\' 2"  (1.575 m)   Wt 87.5 kg   SpO2 95%   BMI 35.30 kg/m   Physical Exam Constitutional:      Appearance: Normal appearance.  Cardiovascular:     Rate and Rhythm: Normal rate and regular rhythm.     Heart sounds: Normal heart sounds.  Pulmonary:     Effort: Pulmonary  effort is normal. No respiratory distress.     Breath sounds: Normal breath sounds.  Abdominal:     General: Abdomen is flat. There is no distension.     Palpations: There is no mass.  Skin:    General: Skin is warm and dry.  Neurological:     Mental Status: She is alert.  Psychiatric:        Mood and Affect: Mood normal.     Imaging: No results found.  Labs:  CBC: Recent Labs    01/19/18 02/06/18 05/19/18 1028 07/15/18 0915  WBC 11.0 9.1 9.8 11.7*  HGB 10.5* 11.3* 13.1 12.1  HCT 34* 36 40.9 39.2  PLT 244 590* 317 455*    COAGS: Recent Labs    09/09/17 1250 10/13/17 1033 07/15/18 0915  INR 1.05 1.01 1.00    BMP: Recent Labs    10/28/17 0554 10/29/17 0530 01/19/18 05/19/18 1028 07/15/18 0915  NA 142 141 135* 143 140  K 3.6 3.2* 3.7 4.5 4.9  CL 97* 94*  --  98 98  CO2 34* 33*  --  26 31  GLUCOSE 142* 92  --  149* 105*  BUN 12 12 16 21  25*  CALCIUM 8.9 9.0  --  10.0 9.6  CREATININE 0.49 0.47 0.5 0.74 0.63  GFRNONAA >60 >60  --  80 >60  GFRAA >60 >60  --  92 >60    LIVER FUNCTION TESTS: Recent Labs    10/27/17 0536 10/28/17 0554 10/29/17 0530 05/19/18 1028  BILITOT 0.4 0.5 0.4 0.3  AST 13* 12* 14* 11  ALT 9* 8* 9* 15  ALKPHOS 70 66 67 96  PROT 6.1* 6.0* 6.3* 6.8  ALBUMIN 2.2* 2.1* 2.3* 4.3    TUMOR MARKERS: No results for input(s): AFPTM,  CEA, CA199, CHROMGRNA in the last 8760 hours.  Assessment and Plan: Neurogenic bladder secondary to multiple sclerosis Dislodged SP tube Plan for CT guided placement of new SP catehter. Labs ok Risks and benefits discussed with the patient including bleeding, infection, damage to adjacent structures, bowel perforation/fistula connection, and sepsis.  All of the patient's questions were answered, patient is agreeable to proceed. Consent signed and in chart.    Thank you for this interesting consult.  I greatly enjoyed meeting Laura Roberts and look forward to participating in their care.  A copy  of this report was sent to the requesting provider on this date.  Electronically Signed: Ascencion Dike, PA-C 07/15/2018, 9:53 AM   I spent a total of 20 minutes in face to face in clinical consultation, greater than 50% of which was counseling/coordinating care for neurogenic bladder

## 2018-08-06 ENCOUNTER — Other Ambulatory Visit: Payer: Self-pay | Admitting: Urology

## 2018-08-06 MED ORDER — ONABOTULINUMTOXINA 100 UNITS IJ SOLR: 200.0000 [IU] | Freq: Once | INTRAMUSCULAR | Status: DC

## 2018-08-12 ENCOUNTER — Non-Acute Institutional Stay: Payer: Medicare Other | Admitting: Primary Care

## 2018-08-12 DIAGNOSIS — G35 Multiple sclerosis: Secondary | ICD-10-CM

## 2018-08-12 DIAGNOSIS — N319 Neuromuscular dysfunction of bladder, unspecified: Secondary | ICD-10-CM

## 2018-08-12 DIAGNOSIS — Z515 Encounter for palliative care: Secondary | ICD-10-CM

## 2018-08-12 DIAGNOSIS — M62838 Other muscle spasm: Secondary | ICD-10-CM

## 2018-08-12 NOTE — Progress Notes (Signed)
[facility] Consult Note Telephone: [phone]  Fax: [phone]  PATIENT NAME: Laura Roberts DOB: [birth year] MRN: [id number]  PRIMARY CARE PROVIDER:   [name], MD  REFERRING PROVIDER:  [name], [place], [place] [address]  RESPONSIBLE PARTY:   Extended Emergency Contact Information Primary Emergency Contact: Britten,Leroy III          [place], [place] Phone: [phone] Relation: Son Secondary Emergency Contact: Salli Quarry [place] Phone: [phone] Mobile Phone: [phone] Relation: Son   ASSESSMENT and RECOMMENDATIONS:   1. Pain: Very painful today, 7/10.  Using antispasmodics and gabapentin for pain/ spasm control. Does not want to pursue prn narcotics stating fear of addiction. Will continue to monitor pain management regimen if pain is not well controlled. Education provided re nature of chronic pain management.  2. Goals of Care: Has DNR, MOST on file at SNF in code book. Reiterates these are her wishes and does not want her sons to have to make these decisions.  3. Urinary elimination: Has cath in now which is smaller than her usual. The former one fell out and the stoma became narrowed. For a procedure to re insert the larger cath on 09/02/2018. Denies dysuria altho there is some sediment in her foley line. Denies s/ sx of UTI.  Palliative to continue to monitor  For goals of cafe, symptom management. Return 1-2 mos for continued monitoring.  I spent 25 minutes providing this consultation,  from 1430 to 1455. More than 50% of the time in this consultation was spent coordinating communication.   HISTORY OF PRESENT ILLNESS:  Laura Roberts is a 78 y.o. year old female with multiple medical problems including MS, paraplegia, depression, h/o fracture, GED, DM2, HTN, neurogenic pain of LE, suprapubic urinary catheter. Palliative  Care was asked to help address goals of care.   CODE STATUS: DNR, Most on file at SNF  PPS: 30% HOSPICE ELIGIBILITY/DIAGNOSIS: TBD  PAST MEDICAL HISTORY:  Past Medical History:  Diagnosis Date  . Age related osteoporosis   . Aphasia   . Constipation   . Cystostomy in place [facility])   . Degenerative arthritis    osteoarthritis   . Depression   . Diabetes mellitus without complication ([place])    type 2   . Full incontinence of feces   . Gait disturbance   . GERD (gastroesophageal reflux disease)   . History of falling   . Hyperlipidemia   . Hypertension   . Multiple sclerosis ([place])   . Neuromuscular disorder ([facility])    Bilateral hand carpal tunnel syndrome  . Neuromuscular dysfunction of bladder   . Nontoxic multinodular goiter   . Obesity   . Obesity   . Other adrenocortical insufficiency ([place])   . Paraplegia ([place])   . Personal history of urinary (tract) infections   . Polyneuropathy   . Pressure ulcer of right buttock, stage 4 ([place])   . Spinal stenosis in cervical region   . Spinal stenosis of lumbosacral region   . Spinal stenosis, thoracic   . Vitamin D deficiency     SOCIAL HX:  Social History   Tobacco Use  . Smoking status: Never Smoker  . Smokeless tobacco: Never Used  Substance Use Topics  . Alcohol use: No    ALLERGIES:  Allergies  Allergen Reactions  . Codeine Other (See Comments)    Difficult breathing and skin problem  .  Ultram [Tramadol] Other (See Comments)    Difficult breathing and skin peeling  . Januvia [Sitagliptin] Rash and Other (See Comments)    Blisters     PERTINENT MEDICATIONS:  Outpatient Encounter Medications as of 08/12/2018  Medication Sig  . acetaminophen (TYLENOL) 325 MG tablet Take 650 mg by mouth every 6 (six) hours as needed for mild pain or fever.   . Amino Acids-Protein Hydrolys (FEEDING SUPPLEMENT, PRO-STAT SUGAR FREE 64,) LIQD Take 30 mLs by mouth 2 (two) times daily.  . ARTIFICIAL TEAR OP Apply 1 drop to eye 4 (four)  times daily as needed (dry eyes).   . baclofen (LIORESAL) 10 MG tablet Take 10-20 mg by mouth See admin instructions. Give 1-1/2 tablets to = 15 mg QAM, 1 tablet to = 10 mg midday, take 2 tablets to = 20 mg qhs  . calcium citrate (CALCITRATE - DOSED IN MG ELEMENTAL CALCIUM) 950 MG tablet Take 200 mg of elemental calcium by mouth daily.  . Carboxymeth-Glycerin-Polysorb (REFRESH OPTIVE ADVANCED) 0.5-1-0.5 % SOLN Apply 1 drop to eye 4 (four) times daily.   . cholecalciferol (VITAMIN D) 1000 UNITS tablet Take 1,000 Units by mouth daily.  . cycloSPORINE (RESTASIS) 0.05 % ophthalmic emulsion 1 drop 2 (two) times daily.  . Dimethyl Fumarate (TECFIDERA) 240 MG CPDR Take 1 capsule (240 mg total) by mouth 2 (two) times daily.  . DULoxetine (CYMBALTA) 30 MG capsule Take 90 mg by mouth daily.   Marland Kitchen gabapentin (NEURONTIN) 300 MG capsule Take 1 capsule (300 mg total) by mouth 2 (two) times daily.  Marland Kitchen guaiFENesin (MUCINEX) 600 MG 12 hr tablet Take 600 mg by mouth 2 (two) times daily.   . insulin glargine (LANTUS) 100 UNIT/ML injection Inject 0.2 mLs (20 Units total) into the skin at bedtime.  . insulin lispro (HUMALOG) 100 UNIT/ML injection Inject 5-10 Units into the skin 3 (three) times daily before meals. If CBG 200-300 = 5 units, 300-400 = 10 units, greater than 400 call provider  . ipratropium-albuterol (DUONEB) 0.5-2.5 (3) MG/3ML SOLN Take 3 mLs by nebulization.  . methimazole (TAPAZOLE) 5 MG tablet Take 1 tablet (5 mg total) by mouth daily.  . Multiple Vitamin (MULTIVITAMIN) tablet Take 1 tablet by mouth daily.  . pantoprazole (PROTONIX) 40 MG tablet Take 40 mg by mouth daily.  . polyethylene glycol (MIRALAX / GLYCOLAX) packet Take 17 g by mouth daily as needed for mild constipation.   . predniSONE (DELTASONE) 5 MG tablet 1 tablet on odd days, one half tablet on even days  . senna-docusate (SENOKOT-S) 8.6-50 MG tablet Take 2 tablets by mouth at bedtime.  . Skin Protectants, Misc. (EUCERIN) cream Apply 1  application topically 2 (two) times daily.  Marland Kitchen tolterodine (DETROL LA) 4 MG 24 hr capsule Take 4 mg by mouth daily.    Facility-Administered Encounter Medications as of 08/12/2018  Medication  . botulinum toxin Type A (BOTOX) injection 200 Units    PHYSICAL EXAM:   VS : 41 cm arm, 98.1-90-18 155/94  Wt 194 lb. Gain of 10 lbs in 5 mos. General: NAD, WNWD appearing,obese Cardiovascular: regular rate and rhythm, s1s2 Pulmonary: clear all fields, coughs productively, clear sputum Abdomen: soft, nontender, + bowel sounds, bms WNL GU: catheter in temporarily ,anticipates a procedure for placing larger catheter and botox Extremities: 1+ edema, MS induced joint deformities  Skin: no rashes, no wounds Neurological: Weakness,  Denies exacerbations of MS, has not walked for 3 years, f/u with neurology in May.  Cyndia Skeeters DNP,  AGPCNP-BC

## 2018-08-17 ENCOUNTER — Encounter (HOSPITAL_COMMUNITY): Payer: Self-pay | Admitting: *Deleted

## 2018-08-17 NOTE — Progress Notes (Addendum)
Preop instructions for: Laura Roberts                          Date of Birth  Oct 30, 1940                           Date of Procedure: 09/02/2018      Doctor:DR Link Snuffer  Time to arrive at [facility]: 0830am Report to: Admitting  Procedure: Cystostomy,suprapubic, botox injection  Any procedure time changes, MD office will notify you!   Do not eat or drink past midnight the night before your procedure.(To include any tube feedings-must be discontinued) Reminder:Follow bowel prep instructions per MD office!   Take these morning medications only with sips of water.(or give through gastrostomy or feeding tube). Mucinex, Eye drops as usual, Nebulizer if needed, Gabapentin, Tecfidera, Pantoprazole, Baclofen, Duloxetime, Solifenacin, Methimazole,  Lantus Insulin- Take 1/2 of pm dose nite before surgery  Note: No Insulin or Diabetic meds should be given or taken the morning of the procedure!   Facility contact: [employer]: [place]:  Transportation contact phone#:[facility]   Please send day of procedure:current med list and meds last taken that day, confirm nothing by mouth status from what time, Patient Demographic info( to include DNR status, problem list, allergies)   RN contact name/phone#: Network engineer card and picture ID Leave all jewelry and other valuables at place where living( no metal or rings to be worn) No contact lens Women-no make-up, no lotions,perfumes,powders   Any questions day of procedure,call [facility] [phone]    Sent from :[facility]                   [place]                   Fax:[phone]  Sent by :Gillian Shields RN

## 2018-08-31 NOTE — Progress Notes (Signed)
Called and spoke to Irving Shows, son and he stated that his mother signs her own consent forms.

## 2018-09-02 ENCOUNTER — Other Ambulatory Visit: Payer: Self-pay

## 2018-09-02 ENCOUNTER — Encounter (HOSPITAL_COMMUNITY)
Admission: RE | Disposition: A | Discharge status: Home / Self Care | Payer: Self-pay | Source: Home / Self Care | Attending: Urology

## 2018-09-02 ENCOUNTER — Ambulatory Visit (HOSPITAL_COMMUNITY): Payer: Medicare Other | Admitting: Physician Assistant

## 2018-09-02 ENCOUNTER — Encounter (HOSPITAL_COMMUNITY): Payer: Self-pay | Admitting: Anesthesiology

## 2018-09-02 ENCOUNTER — Ambulatory Visit (HOSPITAL_COMMUNITY)
Admission: RE | Admit: 2018-09-02 | Discharge: 2018-09-02 | Disposition: A | Discharge status: Home / Self Care | Payer: Medicare Other | Attending: Urology | Admitting: Urology

## 2018-09-02 ENCOUNTER — Telehealth (HOSPITAL_COMMUNITY): Payer: Self-pay | Admitting: *Deleted

## 2018-09-02 DIAGNOSIS — K219 Gastro-esophageal reflux disease without esophagitis: Secondary | ICD-10-CM | POA: Insufficient documentation

## 2018-09-02 DIAGNOSIS — Z794 Long term (current) use of insulin: Secondary | ICD-10-CM | POA: Insufficient documentation

## 2018-09-02 DIAGNOSIS — G35 Multiple sclerosis: Secondary | ICD-10-CM | POA: Diagnosis not present

## 2018-09-02 DIAGNOSIS — F329 Major depressive disorder, single episode, unspecified: Secondary | ICD-10-CM | POA: Insufficient documentation

## 2018-09-02 DIAGNOSIS — E119 Type 2 diabetes mellitus without complications: Secondary | ICD-10-CM | POA: Insufficient documentation

## 2018-09-02 DIAGNOSIS — Z79899 Other long term (current) drug therapy: Secondary | ICD-10-CM | POA: Diagnosis not present

## 2018-09-02 DIAGNOSIS — I10 Essential (primary) hypertension: Secondary | ICD-10-CM | POA: Diagnosis not present

## 2018-09-02 DIAGNOSIS — N319 Neuromuscular dysfunction of bladder, unspecified: Secondary | ICD-10-CM | POA: Diagnosis present

## 2018-09-02 DIAGNOSIS — Z466 Encounter for fitting and adjustment of urinary device: Secondary | ICD-10-CM | POA: Insufficient documentation

## 2018-09-02 HISTORY — PX: BOTOX INJECTION: SHX5754

## 2018-09-02 HISTORY — PX: CYSTOSCOPY: SHX5120

## 2018-09-02 HISTORY — PX: CYSTOSTOMY: SHX155

## 2018-09-02 LAB — CBC
HCT: 41.6 % (ref 36.0–46.0)
Hemoglobin: 12.9 g/dL (ref 12.0–15.0)
MCH: 27.6 pg (ref 26.0–34.0)
MCHC: 31 g/dL (ref 30.0–36.0)
MCV: 88.9 fL (ref 80.0–100.0)
Platelets: 267 10*3/uL (ref 150–400)
RBC: 4.68 MIL/uL (ref 3.87–5.11)
RDW: 16.8 % — ABNORMAL HIGH (ref 11.5–15.5)
WBC: 10.7 10*3/uL — ABNORMAL HIGH (ref 4.0–10.5)
nRBC: 0 % (ref 0.0–0.2)

## 2018-09-02 LAB — BASIC METABOLIC PANEL
Anion gap: 10 (ref 5–15)
BUN: 22 mg/dL (ref 8–23)
CO2: 30 mmol/L (ref 22–32)
Calcium: 9.4 mg/dL (ref 8.9–10.3)
Chloride: 100 mmol/L (ref 98–111)
Creatinine, Ser: 0.52 mg/dL (ref 0.44–1.00)
GFR calc Af Amer: >60 mL/min (ref >60)
GLUCOSE: 130 mg/dL — AB (ref 70–99)
Potassium: 3.9 mmol/L (ref 3.5–5.1)
Sodium: 140 mmol/L (ref 135–145)

## 2018-09-02 LAB — GLUCOSE, CAPILLARY
Glucose-Capillary: 129 mg/dL — ABNORMAL HIGH (ref 70–99)
Glucose-Capillary: 96 mg/dL (ref 70–99)

## 2018-09-02 LAB — HEMOGLOBIN A1C
Hgb A1c MFr Bld: 7.8 % — ABNORMAL HIGH (ref 4.8–5.6)
Mean Plasma Glucose: 177.16 mg/dL

## 2018-09-02 SURGERY — CREATION, CYSTOSTOMY, SUPRAPUBIC
Anesthesia: General

## 2018-09-02 MED ORDER — ONDANSETRON HCL 4 MG/2ML IJ SOLN: 4.0000 mg | Freq: Once | INTRAMUSCULAR | Status: DC | PRN

## 2018-09-02 MED ORDER — ACETAMINOPHEN 160 MG/5ML PO SOLN: 325.0000 mg | ORAL | Status: DC | PRN

## 2018-09-02 MED ORDER — SODIUM CHLORIDE (PF) 0.9 % IJ SOLN
INTRAMUSCULAR | Status: DC | PRN
  Administered 2018-09-02: 20 mL

## 2018-09-02 MED ORDER — ACETAMINOPHEN 325 MG PO TABS: 325.0000 mg | ORAL_TABLET | ORAL | Status: DC | PRN

## 2018-09-02 MED ORDER — MEPERIDINE HCL 50 MG/ML IJ SOLN: 6.2500 mg | INTRAMUSCULAR | Status: DC | PRN

## 2018-09-02 MED ORDER — PHENYLEPHRINE 40 MCG/ML (10ML) SYRINGE FOR IV PUSH (FOR BLOOD PRESSURE SUPPORT)
PREFILLED_SYRINGE | INTRAVENOUS | Status: DC | PRN
  Administered 2018-09-02: 80 ug via INTRAVENOUS
  Administered 2018-09-02 (×2): 40 ug via INTRAVENOUS
  Administered 2018-09-02 (×3): 80 ug via INTRAVENOUS

## 2018-09-02 MED ORDER — LACTATED RINGERS IV SOLN
INTRAVENOUS | Status: DC
  Administered 2018-09-02: 11:00:00 via INTRAVENOUS

## 2018-09-02 MED ORDER — FENTANYL CITRATE (PF) 100 MCG/2ML IJ SOLN
25.0000 ug | INTRAMUSCULAR | Status: DC | PRN
  Administered 2018-09-02 (×2): 25 ug via INTRAVENOUS

## 2018-09-02 MED ORDER — ONDANSETRON HCL 4 MG/2ML IJ SOLN
INTRAMUSCULAR | Status: DC | PRN
  Administered 2018-09-02: 4 mg via INTRAVENOUS

## 2018-09-02 MED ORDER — ONABOTULINUMTOXINA 100 UNITS IJ SOLR
100.0000 [IU] | Freq: Once | INTRAMUSCULAR | Status: AC
  Administered 2018-09-02: 100 [IU] via INTRAMUSCULAR
  Filled 2018-09-02: qty 100

## 2018-09-02 MED ORDER — FENTANYL CITRATE (PF) 100 MCG/2ML IJ SOLN
INTRAMUSCULAR | Status: DC | PRN
  Administered 2018-09-02: 50 ug via INTRAVENOUS

## 2018-09-02 MED ORDER — DEXAMETHASONE SODIUM PHOSPHATE 4 MG/ML IJ SOLN
INTRAMUSCULAR | Status: DC | PRN
  Administered 2018-09-02: 6 mg via INTRAVENOUS

## 2018-09-02 MED ORDER — OXYCODONE HCL 5 MG PO TABS: 5.0000 mg | ORAL_TABLET | Freq: Once | ORAL | Status: DC | PRN

## 2018-09-02 MED ORDER — OXYCODONE HCL 5 MG/5ML PO SOLN: 5.0000 mg | Freq: Once | ORAL | Status: DC | PRN

## 2018-09-02 MED ORDER — SODIUM CHLORIDE 0.9 % IR SOLN
Status: DC | PRN
  Administered 2018-09-02: 3000 mL via INTRAVESICAL

## 2018-09-02 MED ORDER — PROPOFOL 10 MG/ML IV BOLUS
INTRAVENOUS | Status: DC | PRN
  Administered 2018-09-02: 180 mg via INTRAVENOUS

## 2018-09-02 MED ORDER — CEFAZOLIN SODIUM-DEXTROSE 2-4 GM/100ML-% IV SOLN
2.0000 g | INTRAVENOUS | Status: AC
  Administered 2018-09-02: 2 g via INTRAVENOUS
  Filled 2018-09-02: qty 100

## 2018-09-02 MED ORDER — FENTANYL CITRATE (PF) 100 MCG/2ML IJ SOLN
INTRAMUSCULAR | Status: AC
  Administered 2018-09-02: 25 ug via INTRAVENOUS
  Filled 2018-09-02: qty 2

## 2018-09-02 MED ORDER — FENTANYL CITRATE (PF) 100 MCG/2ML IJ SOLN
INTRAMUSCULAR | Status: AC
  Filled 2018-09-02: qty 2

## 2018-09-02 MED ORDER — LACTATED RINGERS IV SOLN: INTRAVENOUS | Status: DC | PRN

## 2018-09-02 MED ORDER — SODIUM CHLORIDE (PF) 0.9 % IJ SOLN
INTRAMUSCULAR | Status: AC
  Filled 2018-09-02: qty 10

## 2018-09-02 MED ORDER — FENTANYL CITRATE (PF) 100 MCG/2ML IJ SOLN: 25.0000 ug | INTRAMUSCULAR | Status: DC | PRN

## 2018-09-02 MED ORDER — LIDOCAINE 2% (20 MG/ML) 5 ML SYRINGE
INTRAMUSCULAR | Status: DC | PRN
  Administered 2018-09-02: 80 mg via INTRAVENOUS

## 2018-09-02 SURGICAL SUPPLY — 40 items
ADH SKN CLS APL DERMABOND .7 (GAUZE/BANDAGES/DRESSINGS)
BAG URINE DRAINAGE (UROLOGICAL SUPPLIES) ×1 IMPLANT
BAG URINE LEG 500ML (DRAIN) IMPLANT
BAG URO CATCHER STRL LF (MISCELLANEOUS) ×2 IMPLANT
BLADE CLIPPER SURG (BLADE) IMPLANT
BLADE SURG 15 STRL LF DISP TIS (BLADE) ×1 IMPLANT
BLADE SURG 15 STRL SS (BLADE) ×2
CATH FOLEY 2WAY SLVR  5CC 16FR (CATHETERS)
CATH FOLEY 2WAY SLVR 30CC 24FR (CATHETERS) ×1 IMPLANT
CATH FOLEY 2WAY SLVR 5CC 16FR (CATHETERS) ×1 IMPLANT
CATH INTERMIT  6FR 70CM (CATHETERS) ×1 IMPLANT
CLOTH BEACON ORANGE TIMEOUT ST (SAFETY) ×1 IMPLANT
COVER WAND RF STERILE (DRAPES) IMPLANT
DERMABOND ADVANCED (GAUZE/BANDAGES/DRESSINGS)
DERMABOND ADVANCED .7 DNX12 (GAUZE/BANDAGES/DRESSINGS) ×1 IMPLANT
DRAPE LAPAROTOMY T 102X78X121 (DRAPES) ×1 IMPLANT
ELECT PENCIL ROCKER SW 15FT (MISCELLANEOUS) IMPLANT
ELECT REM PT RETURN 15FT ADLT (MISCELLANEOUS) IMPLANT
GLOVE BIO SURGEON STRL SZ7.5 (GLOVE) ×1 IMPLANT
GOWN STRL REUS W/TWL LRG LVL3 (GOWN DISPOSABLE) ×6 IMPLANT
GUIDEWIRE STR DUAL SENSOR (WIRE) ×2 IMPLANT
KIT BASIN OR (CUSTOM PROCEDURE TRAY) ×1 IMPLANT
MANIFOLD NEPTUNE II (INSTRUMENTS) ×2 IMPLANT
NEEDLE HYPO 22GX1.5 SAFETY (NEEDLE) IMPLANT
NS IRRIG 1000ML POUR BTL (IV SOLUTION) IMPLANT
PACK CYSTO (CUSTOM PROCEDURE TRAY) ×2 IMPLANT
PACK GENERAL/GYN (CUSTOM PROCEDURE TRAY) ×1 IMPLANT
PLUG CATH AND CAP STER (CATHETERS) ×1 IMPLANT
SPONGE DRAIN TRACH 4X4 STRL 2S (GAUZE/BANDAGES/DRESSINGS) ×4 IMPLANT
SUT MNCRL AB 4-0 PS2 18 (SUTURE) ×1 IMPLANT
SUT PDS AB 1 CTX 36 (SUTURE) ×1 IMPLANT
SUT SILK 2 0 30  PSL (SUTURE) ×1
SUT SILK 2 0 30 PSL (SUTURE) ×1 IMPLANT
SUT VIC AB 2-0 SH 27 (SUTURE)
SUT VIC AB 2-0 SH 27X BRD (SUTURE) ×4 IMPLANT
SYR 30ML LL (SYRINGE) ×1 IMPLANT
SYR CONTROL 10ML LL (SYRINGE) IMPLANT
TOWEL OR 17X26 10 PK STRL BLUE (TOWEL DISPOSABLE) ×2 IMPLANT
TUBING CONNECTING 10 (TUBING) ×2 IMPLANT
WATER STERILE IRR 3000ML UROMA (IV SOLUTION) ×1 IMPLANT

## 2018-09-02 NOTE — Transfer of Care (Signed)
Immediate Anesthesia Transfer of Care Note  Patient: Laura Roberts  Procedure(s) Performed: Procedure(s) with comments: CYSTOSTOMY SUPRAPUBIC (N/A) - 31 MINS BOTOX INJECTION (N/A) CYSTOSCOPY (N/A)  Patient Location: PACU  Anesthesia Type:General  Level of Consciousness:  sedated, patient cooperative and responds to stimulation  Airway & Oxygen Therapy:Patient Spontanous Breathing and Patient connected to face mask oxgen  Post-op Assessment:  Report given to PACU RN and Post -op Vital signs reviewed and stable  Post vital signs:  Reviewed and stable  Last Vitals:  Vitals:   09/02/18 0959 09/02/18 1254  BP: 113/76 124/62  Pulse: 96 96  Resp: 18 (P) 13  Temp: 36.5 C 36.6 C  SpO2: 26% 834%    Complications: No apparent anesthesia complications

## 2018-09-02 NOTE — Progress Notes (Signed)
All of patient's belongings are at [facility] in Nursing office. She arrived today in hospital gown.

## 2018-09-02 NOTE — Anesthesia Procedure Notes (Addendum)
Procedure Name: LMA Insertion Date/Time: 09/02/2018 11:55 AM Performed by: Lavina Hamman, CRNA Pre-anesthesia Checklist: Patient identified, Emergency Drugs available, Suction available and Patient being monitored Patient Re-evaluated:Patient Re-evaluated prior to induction Oxygen Delivery Method: Circle System Utilized Preoxygenation: Pre-oxygenation with 100% oxygen Induction Type: IV induction Ventilation: Mask ventilation without difficulty LMA: LMA inserted LMA Size: 4.0 Number of attempts: 1 Airway Equipment and Method: Bite block Placement Confirmation: positive ETCO2 Tube secured with: Tape Dental Injury: Teeth and Oropharynx as per pre-operative assessment

## 2018-09-02 NOTE — Discharge Instructions (Addendum)
1.  Expect to see some bloody urethral drainage as well as blood in the initial part of your urinary stream for a few days  2.  Limit your exertional activity for approximately 2 weeks after the procedure.  If you are having no bloody drainage or pain at that point, you may liberalize your activities   3.  Keep your follow-up appointment that is scheduled.  If you have problems before the appointment such as continued large clots in your urine, difficulty urinating or fever, contact us before at [phone]       General Anesthesia, Adult, Care After This sheet gives you information about how to care for yourself after your procedure. Your health care provider may also give you more specific instructions. If you have problems or questions, contact your health care provider. What can I expect after the procedure? After the procedure, the following side effects are common:  Pain or discomfort at the IV site.  Nausea.  Vomiting.  Sore throat.  Trouble concentrating.  Feeling cold or chills.  Weak or tired.  Sleepiness and fatigue.  Soreness and body aches. These side effects can affect parts of the body that were not involved in surgery. Follow these instructions at home:  For at least 24 hours after the procedure:  Have a responsible adult stay with you. It is important to have someone help care for you until you are awake and alert.  Rest as needed.  Do not: ? Participate in activities in which you could fall or become injured. ? Drive. ? Use heavy machinery. ? Drink alcohol. ? Take sleeping pills or medicines that cause drowsiness. ? Make important decisions or sign legal documents. ? Take care of children on your own. Eating and drinking  Follow any instructions from your health care provider about eating or drinking restrictions.  When you feel hungry, start by eating small amounts of foods that are soft and easy to digest (bland), such as toast. Gradually  return to your regular diet.  Drink enough fluid to keep your urine pale yellow.  If you vomit, rehydrate by drinking water, juice, or clear broth. General instructions  If you have sleep apnea, surgery and certain medicines can increase your risk for breathing problems. Follow instructions from your health care provider about wearing your sleep device: ? Anytime you are sleeping, including during daytime naps. ? While taking prescription pain medicines, sleeping medicines, or medicines that make you drowsy.  Return to your normal activities as told by your health care provider. Ask your health care provider what activities are safe for you.  Take over-the-counter and prescription medicines only as told by your health care provider.  If you smoke, do not smoke without supervision.  Keep all follow-up visits as told by your health care provider. This is important. Contact a health care provider if:  You have nausea or vomiting that does not get better with medicine.  You cannot eat or drink without vomiting.  You have pain that does not get better with medicine.  You are unable to pass urine.  You develop a skin rash.  You have a fever.  You have redness around your IV site that gets worse. Get help right away if:  You have difficulty breathing.  You have chest pain.  You have blood in your urine or stool, or you vomit blood. Summary  After the procedure, it is common to have a sore throat or nausea. It is also common to  feel tired.  Have a responsible adult stay with you for the first 24 hours after general anesthesia. It is important to have someone help care for you until you are awake and alert.  When you feel hungry, start by eating small amounts of foods that are soft and easy to digest (bland), such as toast. Gradually return to your regular diet.  Drink enough fluid to keep your urine pale yellow.  Return to your normal activities as told by your health care  provider. Ask your health care provider what activities are safe for you. This information is not intended to replace advice given to you by your health care provider. Make sure you discuss any questions you have with your health care provider. Document Released: 09/23/2000 Document Revised: 01/31/2017 Document Reviewed: 01/31/2017 Elsevier Interactive Patient Education  2019 [employer].

## 2018-09-02 NOTE — Anesthesia Postprocedure Evaluation (Signed)
Anesthesia Post Note  Patient: SHAKINA CHOY  Procedure(s) Performed: CYSTOSTOMY SUPRAPUBIC (N/A ) BOTOX INJECTION (N/A ) CYSTOSCOPY (N/A )     Patient location during evaluation: PACU Anesthesia Type: General Level of consciousness: awake and alert Pain management: pain level controlled Vital Signs Assessment: post-procedure vital signs reviewed and stable Respiratory status: spontaneous breathing, nonlabored ventilation, respiratory function stable and patient connected to nasal cannula oxygen Cardiovascular status: blood pressure returned to baseline and stable Postop Assessment: no apparent nausea or vomiting Anesthetic complications: no    Last Vitals:  Vitals:   09/02/18 1345 09/02/18 1354  BP: 116/67 135/74  Pulse: 98 97  Resp: 12 14  Temp: (!) 36.4 C 36.5 C  SpO2: 93%     Last Pain:  Vitals:   09/02/18 1354  TempSrc: Oral  PainSc: 2                  Cass Vandermeulen

## 2018-09-02 NOTE — Op Note (Signed)
Operative Note  Preoperative diagnosis:  1.  Neurogenic bladder with detrusor overactivity  Postoperative diagnosis: 1.  Neurogenic bladder with detrusor overactivity  Procedure(s): 1.  Cystoscopy with intravesical Botox instillation, 200 units 2.  Suprapubic tube tract dilation with complex suprapubic tube replacement   Surgeon: Link Snuffer, MD  Assistants: None  Anesthesia: General  Complications: None immediate  EBL: Minimal  Specimens: 1.  None  Drains/Catheters: 1.  16 French silicone suprapubic catheter  Intraoperative findings: Cystoscopy revealed a low capacity bladder.  It was contracted with low the bladder filled much easier than at the time of Botox injection last time.  There was a great some catheter edema. 2.  Tight suprapubic tube tract the required dilation in order to upsize the catheter  Indication: 78 year old female with a history of neurogenic bladder and detrusor overactivity with long-standing history of urethral catheter that was switched over to suprapubic tube.  She presents for the previously mentioned operation due to continuous leakage from her urethra despite suprapubic tube.  Her leakage improved with Botox injection last time.  She presents to have this done again.  Description of procedure:  The patient was identified and consent was obtained.  The patient was taken to the operating room and placed in the supine position.  The patient was placed under general anesthesia.  Perioperative antibiotics were administered.  The patient was placed in dorsal lithotomy.  Patient was prepped and draped in a standard sterile fashion and a timeout was performed.  A sensor wire was advanced through the 14 French suprapubic catheter.  The catheter was then removed.  I tried to place a 53 French catheter over the wire but there was resistance.  I therefore used a sound dilator to carefully dilate the tract to 20 [place].  I was then able to place  an 95 French silicone catheter over the wire.  10 cc of sterile water was placed into the balloon.    A 21 French rigid cystoscope was advanced into the urethra and into the bladder.  Complete cystoscopy was performed with findings noted above.  I then systematically injected 200 units of Botox in 20 different locations.  I then withdrew the scope. This concluded the operation.  Patient tolerated procedure well and was stable postoperatively.  Plan: Patient will return in 3 to 4 months to see how she is doing with the Botox.  Unfortunately, the patient's urethra is wide open and very patulous.  She may require a bladder neck surgery or sling in order to create resistance at the bladder neck.

## 2018-09-02 NOTE — H&P (Signed)
CC/HPI: cc: Neurogenic bladder  HPI:  10/31/17  A 78 year old female with a history of MS and neurogenic bladder. He recently underwent a suprapubic tube placement on 09/09/2017. She was admitted to the hospital with cellulitis around the suprapubic tube site and was seen by Dr. Junious Silk. She has improved from this. She had a SP tube change on 10/13/2017. This was exchanged for a 14 [place] tube. She presents for follow-up. She was recently hospitalized for a groin abscess that was incised and drained. She is happy now with her suprapubic tube. She has mild leakage around the catheter but not much. Infection has improved.   11/17/2017:  Patient presents today for suprapubic change. She has no other complaints.   12/03/17: Patient presents today with complaints of decreased urinary output from her SP tube site and increased urethral leakage. She states that this has been ongoing for the past week. She is having to wear 2 adult diapers per day, which are typically soaked. She denies any current suprapubic discomfort or abdominal pain. She denies fever or gross hematuria. No nausea or vomiting. She currently uses Detrol for bladder spasms.   12/23/17: She returns today with complaints of increased urinary leakage per her urethra. She remains on Detrol. She was seen in the ED on 6/14 after her SP catheter came out and was not able to be replaced her rehab facility. Per her report, they had a difficult time replaced the catheter and a 12 Fr. SP catheter was eventually placed. Today she states that it intermittently drains well. She states that is draining okay currently, but was not draining well this morning. She notes that when it is not draining, she is having increased leakage per her urethra. She denies fevers or gross hematuria. No suprapubic discomfort.   01/06/2018:  The patient continues with a suprapubic catheter but continues to have significant leakage per urethra. She is interested and entered detrusor  Botox. She has failed oral agents including Detrol.   05/05/2018:  Patient is status post cystoscopy with Botox instillation on 01/30/2018. She initially did well with this. Urine leakage decreased. Of note, during the surgery, she had a very patulous urethra and I was able to insert a finger into the bladder. However, Botox did improve her symptoms of leakage and bladder spasms. Over the past few days, she had an increase in spasms and leakage and the catheter was not draining but after manipulation her catheter has gone back to normal.+   07/06/18: She returns today for follow up. She is s/p Botox instillation as noted above. She presents today with complications regarding SP tube. She states that her SP tube came out late Saturday evening and they were unable to replace this at her SNF. She states that it was draining well up until that point in time. Until this point, she states she has been having some leakage per her urethra intermittently, but generally felt that she had been doing well. Since SP catheter has been out, she has had significant amount of leakage per her urethra since that time and is soaking diapers. No fevers or chills.   07/23/18:  Patient is s/p replacement of SPT, which she underwent on 07/15/18. She c/o bladder spasms. She takes [employer]. SPT has been draining clear, yellow urine.   08/04/2018  Patient had her suprapubic tube replaced on 07/15/2018. She wants it up size. Unfortunately, cannot upsize currently since it has only been 2 weeks and the tract is not mature. She has not had  Botox in about 6 months. She desires this as well.     ALLERGIES: Codeine Januvia Tramadol - Hives    MEDICATIONS: Omeprazole 20 mg tablet, delayed release  Acetaminophen  Amlodipine Besylate 10 mg tablet  Artificial Tears  Baclofen 10 mg tablet  Calcium Citrate  Cymbalta 30 mg capsule,delayed release  Duloxetine Hcl 30 mg capsule,delayed release  Gabapentin 300 mg capsule  Glimepiride 2 mg  tablet  Ipratropium Bromide  Lantus  Lidoderm  Methimazole 5 mg tablet  Minerin cream  Miralax  Mucinex D 600 mg-60 mg tablet, extended release 12 hr  Pantoprazole Sodium 40 mg tablet, delayed release  Prednisone 5 mg tablet  Prednisone 5 mg tablet  Prednisone 2.5 mg tablet  Restasis 0.05 % dropperette, single-use drop dispenser  Senna  Solifenacin Succinate 10 mg tablet  Tecfidera 240 mg capsule,delayed release  Tinactin  Vitamin D3     Notes: Detrol LA rx faxed to [facility]   GU PSH: Compl Change SP Tube - 01/30/2018, 11/17/2017 Cystourethroscopy, W/Injection For Chemodenervation Of Bladder - 01/30/2018 Simple Change SP Tube - 12/03/2017, 11/17/2017    NON-GU PSH: No Non-GU PSH    GU PMH: Bladder, Neuromuscular dysfunction, Unspec - 07/23/2018 Urinary Tract Inf, Unspec site - 07/23/2018 Detrusor overactivity - 01/06/2018 Unihibited neuropathic bladder - 01/06/2018 Areflexic bladder - 09/16/2017, - 09/03/2017    NON-GU PMH: Hypercholesterolemia Hypertension Major depressive disorder, recurrent, in partial remission Multiple sclerosis Spinal stenosis, thoracic region    FAMILY HISTORY: No Family History    SOCIAL HISTORY: Marital Status: Unknown Preferred Language: English; Ethnicity: Not Hispanic Or Latino; Race: Black or African American Current Smoking Status: Patient has never smoked.   Tobacco Use Assessment Completed: Used Tobacco in last 30 days? Does not use smokeless tobacco. Has never drank.  Does not use drugs. Does not drink caffeine. Has not had a blood transfusion.    REVIEW OF SYSTEMS:    GU Review Female:   Patient denies frequent urination, hard to postpone urination, burning /pain with urination, get up at night to urinate, leakage of urine, stream starts and stops, trouble starting your stream, have to strain to urinate, and being pregnant.  Gastrointestinal (Upper):   Patient denies nausea, vomiting, and indigestion/ heartburn.  Gastrointestinal (Lower):    Patient denies diarrhea and constipation.  Constitutional:   Patient denies fever, night sweats, weight loss, and fatigue.  Skin:   Patient denies skin rash/ lesion and itching.  Eyes:   Patient denies blurred vision and double vision.  Ears/ Nose/ Throat:   Patient denies sore throat and sinus problems.  Hematologic/Lymphatic:   Patient denies swollen glands and easy bruising.  Cardiovascular:   Patient denies leg swelling and chest pains.  Respiratory:   Patient denies cough and shortness of breath.  Endocrine:   Patient denies excessive thirst.  Musculoskeletal:   Patient denies back pain and joint pain.  Neurological:   Patient denies headaches and dizziness.  Psychologic:   Patient denies depression and anxiety.   VITAL SIGNS:      08/04/2018 11:07 AM  BP 141/87 mmHg  Heart Rate 101 /min  Temperature 98.4 F / 36.8 C   MULTI-SYSTEM PHYSICAL EXAMINATION:    Constitutional: Well-nourished. In a wheelchair  Neck: Neck symmetrical, not swollen. Normal tracheal position.  Respiratory: No labored breathing, no use of accessory muscles.   Cardiovascular: Normal temperature, adequate perfusion of extremities  Skin: No paleness, no jaundice  Neurologic / Psychiatric: Oriented to time, oriented to place, oriented to person.  No depression, no anxiety, no agitation.  Gastrointestinal: Obese abdomen. Suprapubic tube presen draining yellow urine. No mass, no tenderness, no rigidity.   Eyes: Normal conjunctivae. Normal eyelids.  Musculoskeletal: Normal gait and station of head and neck. Uses a wheelchair.     PAST DATA REVIEWED:  Source Of History:  Patient  Records Review:   Previous Patient Records   PROCEDURES: None   ASSESSMENT:      ICD-10 Details  1 GU:   Bladder, Neuromuscular dysfunction, Unspec - N31.9   2   Detrusor overactivity - N31.1    PLAN:           Document Letter(s):  Created for Patient: Clinical Summary         Notes:   Plan for repeat Botox in the operating  room. I will change the suprapubic tube at that time and try to upsize it.     Signed by Link Snuffer, III, M.D. on 08/04/18 at 11:24 AM (EST)

## 2018-09-02 NOTE — Anesthesia Preprocedure Evaluation (Signed)
Anesthesia Evaluation  Patient identified by MRN, date of birth, ID band Patient awake    Reviewed: Allergy & Precautions, H&P , NPO status , Patient's Chart, lab work & pertinent test results  Airway Mallampati: II  TM Distance: >3 FB Neck ROM: Full    Dental  (+) Missing,    Pulmonary neg pulmonary ROS,    Pulmonary exam normal breath sounds clear to auscultation       Cardiovascular hypertension,  Rhythm:Regular Rate:Tachycardia  ECG: ST, rate 127   Neuro/Psych PSYCHIATRIC DISORDERS Depression Multiple sclerosis  Paraplegia   Neuromuscular disease    GI/Hepatic Neg liver ROS, GERD  Medicated and Controlled,  Endo/Other  diabetes, Type 2, Insulin Dependent  Renal/GU negative Renal ROS     Musculoskeletal Pressure ulcer of right buttock, stage 4  Spinal stenosis   Abdominal (+) + obese,   Peds  Hematology  (+) Blood dyscrasia, anemia , HLD   Anesthesia Other Findings DETRUSOR OVERACTIVITY  Reproductive/Obstetrics                             Anesthesia Physical  Anesthesia Plan  ASA: IV  Anesthesia Plan: General   Post-op Pain Management:    Induction: Intravenous  PONV Risk Score and Plan: 3 and Ondansetron, Dexamethasone and Treatment may vary due to age or medical condition  Airway Management Planned: Oral ETT and LMA  Additional Equipment:   Intra-op Plan:   Post-operative Plan: Extubation in OR  Informed Consent: I have reviewed the patients History and Physical, chart, labs and discussed the procedure including the risks, benefits and alternatives for the proposed anesthesia with the patient or authorized representative who has indicated his/her understanding and acceptance.     Dental advisory given  Plan Discussed with: CRNA, Anesthesiologist and Surgeon  Anesthesia Plan Comments:         Anesthesia Quick Evaluation

## 2018-09-02 NOTE — Progress Notes (Signed)
[facility] notified of pt's readiness for discharge  Laura Roberts

## 2018-09-03 ENCOUNTER — Non-Acute Institutional Stay (SKILLED_NURSING_FACILITY): Payer: Medicare Other | Admitting: Internal Medicine

## 2018-09-03 ENCOUNTER — Encounter: Payer: Self-pay | Admitting: Internal Medicine

## 2018-09-03 DIAGNOSIS — G35 Multiple sclerosis: Secondary | ICD-10-CM | POA: Diagnosis not present

## 2018-09-03 DIAGNOSIS — N319 Neuromuscular dysfunction of bladder, unspecified: Secondary | ICD-10-CM | POA: Diagnosis not present

## 2018-09-03 DIAGNOSIS — E119 Type 2 diabetes mellitus without complications: Secondary | ICD-10-CM

## 2018-09-03 DIAGNOSIS — Z9359 Other cystostomy status: Secondary | ICD-10-CM

## 2018-09-03 DIAGNOSIS — Z794 Long term (current) use of insulin: Secondary | ICD-10-CM

## 2018-09-03 NOTE — Progress Notes (Signed)
NURSING HOME LOCATION:  [facility] ROOM NUMBER:  [id number]  CODE STATUS:  DNR  PCP:  [name] MD.   This is a nursing facility follow up of chronic medical diagnoses  Interim medical record and care since last [facility] visit was updated with review of diagnostic studies and change in clinical status since last visit were documented.  HPI: Patient is a permanent resident of the facility with medical diagnoses of history of MS complicated by paraplegia and neurogenic bladder, hyperthyroidism with multinodular goiter, and diabetes. Dr. Jannifer Franklin, neurology follows her MS & will see her in May. Diabetic control is been adequate; A1c was 7.8% on 3/4.  Glucoses have been markedly variable with lows of 77 and a high of 312 in context of suboptimal dietary compliance. Dr. Link Snuffer, urology performed cystoscopy with intravesical Botox instillation and supra pubic tube tract dilation with complex suprapubic tube replacement 09/02/2018 for neurogenic bladder with detrusor overactivity complicated by continuous leakage from her urethra despite the suprapubic tube.  Urologic issues are in the context of history of multiple sclerosis.  Previous Botox injections have improved leakage.  Cystoscopy revealed a low capacity bladder and narrowed suprapubic tube track which required dilation to upsize the catheter.  She had previously been hospitalized with cellulitis around the suprapubic tube site.  Review of systems: She states that the suprapubic pain is better following the Botox treatment.  She stated that her blood pressure and glucoses have been elevated due to the suprapubic pain.  She  did have some blood in the urine after the procedure as expected.  She feels her numbness in the lower extremities has ascended somewhat.  She has a follow-up appointment in May with her neurologist.  She describes pain in the left upper extremity related to rotator cuff syndrome.   Constitutional: No  fever, significant weight change, fatigue  Eyes: No redness, discharge, pain, vision change ENT/mouth: No nasal congestion,  purulent discharge, earache, change in hearing, sore throat  Cardiovascular: No chest pain, palpitations, paroxysmal nocturnal dyspnea, claudication, edema  Respiratory: No cough, sputum production, hemoptysis, DOE, significant snoring, apnea   Gastrointestinal: No heartburn, dysphagia, nausea /vomiting, rectal bleeding, melena, change in bowels Genitourinary: No dysuria, pyuria Dermatologic: No rash, pruritus, change in appearance of skin Neurologic: No dizziness, headache, syncope, seizures Psychiatric: No significant anxiety, depression, insomnia, anorexia Endocrine: No change in hair/skin/nails, excessive thirst, excessive hunger, excessive urination  Hematologic/lymphatic: No significant bruising, lymphadenopathy Allergy/immunology: No itchy/watery eyes, significant sneezing, urticaria, angioedema  Pertinent or positive findings: When seen to take vital signs, the patient was eating cookies which were not sugar-free. She reclines in lounge chair/wheelchair apparatus.  She has hirsutism of the chin. Repeat pulse was 99 and regular.  Abdomen is protuberant; cath present.  She has no movement in the lower extremities.  Strength in the upper extremities is fair.  There is decreased range of motion of the left upper extremity due to pain.  Physical exam: General appearance: Adequately nourished; no acute distress, increased work of breathing is present.   Lymphatic: No lymphadenopathy about the head, neck, axilla. Eyes: No conjunctival inflammation or lid edema is present. There is no scleral icterus. Ears:  External ear exam shows no significant lesions or deformities.   Nose:  External nasal examination shows no deformity or inflammation. Nasal mucosa are pink and moist without lesions, exudates Oral exam:  Lips and gums are healthy appearing.  Neck:  No thyromegaly,  masses, tenderness noted.    Heart:  Normal rate and regular rhythm. S1 and S2 normal without gallop, murmur, click, rub .  Lungs: Chest clear to auscultation without wheezes, rhonchi, rales, rubs. Abdomen: Bowel sounds are normal. Abdomen is soft and nontender with no organomegaly, hernias, masses. GU: Deferred  Extremities:  No cyanosis, clubbing, edema  Neurologic exam : Balance, Rhomberg, finger to nose testing could not be completed due to clinical state Skin: Warm & dry w/o tenting. No visable rash.  See summary under each active problem in the Problem List with associated updated therapeutic plan

## 2018-09-04 ENCOUNTER — Encounter: Payer: Self-pay | Admitting: Internal Medicine

## 2018-09-04 LAB — HEPATIC FUNCTION PANEL
ALT: 12 (ref 7–35)
AST: 9 — AB (ref 13–35)

## 2018-09-04 LAB — CBC AND DIFFERENTIAL
HCT: 40 (ref 36–46)
Hemoglobin: 13.2 (ref 12.0–16.0)
Platelets: 256 (ref 150–399)
WBC: 9.5

## 2018-09-04 LAB — BASIC METABOLIC PANEL
BUN: 20 (ref 4–21)
Glucose: 153
Potassium: 4.5 (ref 3.4–5.3)
Sodium: 142 (ref 137–147)

## 2018-09-04 LAB — TSH: TSH: 2.68 (ref 0.41–5.90)

## 2018-09-04 LAB — LIPID PANEL
Cholesterol: 203 — AB (ref 0–200)
HDL: 69 (ref 35–70)
LDL Cholesterol: 120
Triglycerides: 69 (ref 40–160)

## 2018-09-04 LAB — HEMOGLOBIN A1C: Hemoglobin A1C: 7.9

## 2018-09-04 NOTE — Assessment & Plan Note (Signed)
Stable post replacement 09/03/2018 by Dr Gloriann Loan, Urology

## 2018-09-04 NOTE — Assessment & Plan Note (Signed)
Neurology F/U in May

## 2018-09-04 NOTE — Assessment & Plan Note (Signed)
Botox prn as per Dr Gloriann Loan

## 2018-09-04 NOTE — Patient Instructions (Signed)
See assessment and plan under each diagnosis in the problem list and acutely for this visit 

## 2018-09-04 NOTE — Assessment & Plan Note (Signed)
She states bladder pain cause of hyperglycemia Dietary non compliance ( cookies, snacks) discussed An  A1c of 8% or less  is the safest goal for her; it is critical to prevent hypoglycemia. Recheck A1c every 3-4 months.

## 2018-09-08 ENCOUNTER — Encounter: Payer: Self-pay | Admitting: Endocrinology

## 2018-09-08 ENCOUNTER — Ambulatory Visit (INDEPENDENT_AMBULATORY_CARE_PROVIDER_SITE_OTHER): Payer: Medicare Other | Admitting: Endocrinology

## 2018-09-08 ENCOUNTER — Other Ambulatory Visit: Payer: Self-pay

## 2018-09-08 VITALS — BP 132/74 | HR 89 | Ht 62.0 in | Wt 192.0 lb

## 2018-09-08 DIAGNOSIS — E0590 Thyrotoxicosis, unspecified without thyrotoxic crisis or storm: Secondary | ICD-10-CM

## 2018-09-08 LAB — T4, FREE: Free T4: 1.1 ng/dL (ref 0.60–1.60)

## 2018-09-08 LAB — MAGNESIUM: Magnesium: 1.8

## 2018-09-08 LAB — TSH: TSH: 1.1 u[IU]/mL (ref 0.35–4.50)

## 2018-09-08 LAB — VITAMIN D 25 HYDROXY (VIT D DEFICIENCY, FRACTURES): Vitamin D, 25-Hydroxy: 43.06

## 2018-09-08 NOTE — Progress Notes (Signed)
Subjective:    Patient ID: Laura Roberts, female    DOB: [birth year], 78 y.o.   MRN: [id number]  HPI Pt returns for f/u of hyperthyroidism (dx'ed 2014; she lives at [facility], so she cannot be isolated for RAI; [place] (2012) showed multiple nodules scattered throughout the right lobe of no more than 13 mm in diameter).  No new symptoms.   Past Medical History:  Diagnosis Date  . Age related osteoporosis   . Aphasia   . Constipation   . Cystostomy in place [facility])   . Degenerative arthritis    osteoarthritis   . Depression   . Diabetes mellitus without complication ([place])    type 2   . Full incontinence of feces   . Gait disturbance   . GERD (gastroesophageal reflux disease)   . History of falling   . Hyperlipidemia   . Hypertension   . Multiple sclerosis ([place])   . Neuromuscular disorder ([facility])    Bilateral hand carpal tunnel syndrome  . Neuromuscular dysfunction of bladder   . Nontoxic multinodular goiter   . Obesity   . Obesity   . Other adrenocortical insufficiency ([place])   . Paraplegia ([place])   . Personal history of urinary (tract) infections   . Polyneuropathy   . Polyneuropathy   . Pressure ulcer of right buttock, stage 4 ([place])   . Spinal stenosis in cervical region   . Spinal stenosis of lumbosacral region   . Spinal stenosis, thoracic   . Vitamin D deficiency     Past Surgical History:  Procedure Laterality Date  . ABDOMINAL HYSTERECTOMY    . BACK SURGERY    . BOTOX INJECTION N/A 01/30/2018   Procedure: CYSTOSCOPY BOTOX INJECTION, SUPRAPUBIC EXCHANGE;  Surgeon: Lucas Mallow, MD;  Location: [facility];  Service: Urology;  Laterality: N/A;  . BOTOX INJECTION N/A 09/02/2018   Procedure: BOTOX INJECTION;  Surgeon: Lucas Mallow, MD;  Location: [facility];  Service: Urology;  Laterality: N/A;  . CATARACT EXTRACTION Right   . CHOLECYSTECTOMY    . CYSTOSCOPY N/A 09/02/2018   Procedure: CYSTOSCOPY;  Surgeon: Lucas Mallow, MD;  Location: [facility];  Service: Urology;   Laterality: N/A;  . CYSTOSTOMY N/A 09/02/2018   Procedure: CYSTOSTOMY SUPRAPUBIC;  Surgeon: Lucas Mallow, MD;  Location: [facility];  Service: Urology;  Laterality: N/A;  49 MINS  . FEMUR IM NAIL Right 06/02/2014   Procedure: INTRAMEDULLARY (IM) RETROGRADE FEMORAL NAILING;  Surgeon: Johnny Bridge, MD;  Location: [facility];  Service: Orthopedics;  Laterality: Right;  . GROIN DISSECTION Left 10/24/2017   Procedure: IRRIGATION AND DEBRIDEMENT, LEFT THIGH ABCESS ;  Surgeon: Alphonsa Overall, MD;  Location: [facility];  Service: General;  Laterality: Left;  . IR CATHETER TUBE CHANGE  10/13/2017  . IR CATHETER TUBE CHANGE  07/15/2018  . left hand carpal tunnel surgery    . REPLACEMENT TOTAL KNEE BILATERAL  2004    Social History   Socioeconomic History  . Marital status: Widowed    Spouse name: Not on file  . Number of children: 2  . Years of education: 17yr colleg  . Highest education level: Not on file  Occupational History  . Occupation: N/A    Employer: [employer]    Comment: [employer]  Social Needs  . Financial resource strain: Not hard at all  . Food insecurity:    Worry: Never true    Inability: Never true  .  Transportation needs:    Medical: No    Non-medical: No  Tobacco Use  . Smoking status: Never Smoker  . Smokeless tobacco: Never Used  Substance and Sexual Activity  . Alcohol use: No  . Drug use: No  . Sexual activity: Never  Lifestyle  . Physical activity:    Days per week: 7 days    Minutes per session: 30 min  . Stress: Rather much  Relationships  . Social connections:    Talks on phone: More than three times a week    Gets together: More than three times a week    Attends religious service: Never    Active member of club or organization: No    Attends meetings of clubs or organizations: Never    Relationship status: Widowed  . Intimate partner violence:    Fear of current or ex partner: No    Emotionally abused: No    Physically abused: No     Forced sexual activity: No  Other Topics Concern  . Not on file  Social History Narrative   Patient is widowed with 2 children   Patient is right handed   Patient has 2 yrs of college   Patient drinks 2-3 cups of tea daily    Current Outpatient Medications on File Prior to Visit  Medication Sig Dispense Refill  . acetaminophen (TYLENOL) 325 MG tablet Take 650 mg by mouth every 6 (six) hours as needed for mild pain or fever.     . Amino Acids-Protein Hydrolys (FEEDING SUPPLEMENT, PRO-STAT SUGAR FREE 64,) LIQD Take 30 mLs by mouth 2 (two) times daily. 900 mL 0  . ARTIFICIAL TEAR OP Apply 1 drop to eye 4 (four) times daily as needed (dry eyes).     . baclofen (LIORESAL) 10 MG tablet Take 10-20 mg by mouth See admin instructions. Take 1.5 tablets (15 mg) by mouth in the morning for spasms, take 1 tablet (10 mg) by mouth midday, & take 2 tablets (20 mg) by mouth at bedtime.    . calcium citrate (CALCITRATE - DOSED IN MG ELEMENTAL CALCIUM) 950 MG tablet Take 200 mg of elemental calcium by mouth daily. (0800)    . Carboxymeth-Glycerin-Polysorb (REFRESH OPTIVE ADVANCED) 0.5-1-0.5 % SOLN Apply 1 drop to eye 4 (four) times daily. (0900, 1300, 1700, & 2100)    . cholecalciferol (VITAMIN D) 1000 UNITS tablet Take 1,000 Units by mouth daily. (0800)    . cycloSPORINE (RESTASIS) 0.05 % ophthalmic emulsion Place 1 drop into both eyes 2 (two) times daily. (0900 & 1700)    . Dimethyl Fumarate (TECFIDERA) 240 MG CPDR Take 1 capsule (240 mg total) by mouth 2 (two) times daily. 60 capsule 11  . DULoxetine (CYMBALTA) 30 MG capsule Take 30 mg by mouth daily. Give along with 60 mg to = 90 mg    . DULoxetine (CYMBALTA) 60 MG capsule Take 60 mg by mouth daily. Give along with 30 mg to = 90 mg    . gabapentin (NEURONTIN) 300 MG capsule Take 1 capsule (300 mg total) by mouth 2 (two) times daily. 60 capsule 0  . guaiFENesin (MUCINEX) 600 MG 12 hr tablet Take 1,200 mg by mouth 2 (two) times daily. (0900 & 1700)    .  Insulin Glargine (LANTUS SOLOSTAR) 100 UNIT/ML Solostar Pen LANTUS SOLOSTAR 100 UNIT/ML INJECT 22 UNITS SUBCUTANEOUSLY AT BEDTIME (PRIME PEN WITH 2 UNITS PRIOR TO EACH USE - DO NOT MIX WITH ANY    . insulin lispro (HUMALOG) 100  UNIT/ML injection Inject 5-10 Units into the skin 3 (three) times daily before meals. Sliding Scale Insulin If CBG 200-300 = 5 units, 300-400 = 10 units, greater than 400 call provider    . ipratropium-albuterol (DUONEB) 0.5-2.5 (3) MG/3ML SOLN Take 3 mLs by nebulization every 6 (six) hours as needed (wheezing/coughing).     Marland Kitchen linaclotide (LINZESS) 72 MCG capsule Take 72 mcg by mouth every morning.    . methimazole (TAPAZOLE) 5 MG tablet Take 1 tablet (5 mg total) by mouth daily. 30 tablet 5  . Multiple Vitamin (MULTIVITAMIN WITH MINERALS) TABS tablet Take 1 tablet by mouth daily. (0900)    . pantoprazole (PROTONIX) 40 MG tablet Take 40 mg by mouth daily at 6 (six) AM.     . polyethylene glycol (MIRALAX / GLYCOLAX) packet Take 17 g by mouth daily as needed (constipation.).     Marland Kitchen predniSONE (DELTASONE) 5 MG tablet 1 tablet on odd days, one half tablet on even days    . senna-docusate (SENOKOT-S) 8.6-50 MG tablet Take 2 tablets by mouth at bedtime. (2000)    . Skin Protectants, Misc. (MINERIN) CREA MINERIN CREME APPLY 1 APPLICATION TOPICALLY TWICE A DAY    . solifenacin (VESICARE) 10 MG tablet Take 10 mg by mouth daily. (0900)     Current Facility-Administered Medications on File Prior to Visit  Medication Dose Route Frequency Provider Last Rate Last Dose  . botulinum toxin Type A (BOTOX) injection 200 Units  200 Units Intramuscular Once Lucas Mallow, MD        Allergies  Allergen Reactions  . Codeine Other (See Comments)    Difficult breathing and skin problem  . Ultram [Tramadol] Other (See Comments)    Difficult breathing and skin peeling  . Januvia [Sitagliptin] Rash and Other (See Comments)    Blisters    Family History  Problem Relation Age of Onset  .  Multiple sclerosis Other        neices.   . Cancer Mother   . Diabetes Mother   . GI Bleed Sister        diverticulitis  . Thyroid disease Neg Hx     BP 132/74 (BP Location: Right Arm, Patient Position: Sitting, Cuff Size: Large)   Pulse 89   Ht 5\' 2"  (1.575 m)   Wt 192 lb (87.1 kg)   SpO2 95%   BMI 35.12 kg/m    Review of Systems denies fever.      Objective:   Physical Exam VITAL SIGNS:  See vs page.   GENERAL: no distress.   In wheelchair.   NECK: There is no palpable thyroid enlargement.  No thyroid nodule is palpable.  No palpable lymphadenopathy at the anterior neck.     Lab Results  Component Value Date   TSH 1.10 09/08/2018   T3TOTAL 48 (L) 08/15/2017       Assessment & Plan:  Hyperthyroidism: well-controlled.  Please continue the same medication Small MNG: all she needs for this is periodic PE of the neck  Patient Instructions  Thyroid blood tests are requested for you today.  We'll let you know about the results.  Please continue the same methimazole.  If ever you have fever while taking methimazole, stop it and call us, even if the reason is obvious, because of the risk of a rare side-effect.   Please come back for a follow-up appointment in 5-6 months.

## 2018-09-08 NOTE — Patient Instructions (Addendum)
Thyroid blood tests are requested for you today.  We'll let you know about the results.  Please continue the same methimazole.  If ever you have fever while taking methimazole, stop it and call us, even if the reason is obvious, because of the risk of a rare side-effect.   Please come back for a follow-up appointment in 5-6 months.

## 2018-09-23 ENCOUNTER — Other Ambulatory Visit: Payer: Self-pay

## 2018-09-23 ENCOUNTER — Non-Acute Institutional Stay: Payer: Medicare Other | Admitting: Adult Health Nurse Practitioner

## 2018-09-23 DIAGNOSIS — Z515 Encounter for palliative care: Secondary | ICD-10-CM

## 2018-09-23 NOTE — Progress Notes (Signed)
[facility] Consult Note Telephone: [phone]  Fax: [phone]  PATIENT NAME: Laura Roberts DOB: [birth year] MRN: [id number]  PRIMARY CARE PROVIDER:   [name], MD  REFERRING PROVIDER:  [name], [address] [place], [place] [address]  RESPONSIBLE PARTY:   Extended Emergency Contact Information Primary Emergency Contact: Rode,Leroy III          [place], [place] Phone: [phone] Relation: Son Secondary Emergency Contact: Salli [place] Phone: [phone] Mobile Phone: [phone] Relation: Son    RECOMMENDATIONS and PLAN:  1. Pain: patient has no complaints of pain today  2. Urinary elimination: Has suprapubic catheter in place that has been switched to a larger size.  States that she still has bladder spasms.  Has been receiving botox for the spasms with relief.   3. Goals of Care: Has DNR, MOST on file at SNF in code book. No changes today.  Patient is doing well with no concerns that need to be addressed today.  Does state having a little increased anxiety over the current national situation with the coronavirus.  I spent 25 minutes providing this consultation,  from 10:15 to 10:40. More than 50% of the time in this consultation was spent coordinating communication.   HISTORY OF PRESENT ILLNESS:  Laura Roberts is a 78 y.o. year old female with multiple medical problems including MS, paraplegia, depression, h/o fracture, GED, DM2, HTN, neurogenic pain of LE, suprapubic urinary catheter. Palliative Care was asked to help address goals of care.   CODE STATUS: DNR  PPS: 30% HOSPICE ELIGIBILITY/DIAGNOSIS: TBD  PHYSICAL EXAM:   General: patient lying in bed in NAD Cardiovascular: regular rate and rhythm Pulmonary: lung sounds clear, normal respiratory effort Abdomen: soft, obese, nontender, + bowel sounds, bms WNL GU: suprapubic  catheter in place with clear yellow urine noted in bag Extremities: trace edema, MS induced joint deformities  Skin: no rashes, no wounds Neurological: Weakness,  Denies exacerbations of MS, has not walked for 3 years follow up with neurology in May   PAST MEDICAL HISTORY:  Past Medical History:  Diagnosis Date  . Age related osteoporosis   . Aphasia   . Constipation   . Cystostomy in place [facility])   . Degenerative arthritis    osteoarthritis   . Depression   . Diabetes mellitus without complication ([place])    type 2   . Full incontinence of feces   . Gait disturbance   . GERD (gastroesophageal reflux disease)   . History of falling   . Hyperlipidemia   . Hypertension   . Multiple sclerosis ([place])   . Neuromuscular disorder (H[facility])    Bilateral hand carpal tunnel syndrome  . Neuromuscular dysfunction of bladder   . Nontoxic multinodular goiter   . Obesity   . Obesity   . Other adrenocortical insufficiency ([place])   . Paraplegia ([place])   . Personal history of urinary (tract) infections   . Polyneuropathy   . Polyneuropathy   . Pressure ulcer of right buttock, stage 4 ([place])   . Spinal stenosis in cervical region   . Spinal stenosis of lumbosacral region   . Spinal stenosis, thoracic   . Vitamin D deficiency     SOCIAL HX:  Social History   Tobacco Use  . Smoking status: Never Smoker  . Smokeless tobacco: Never Used  Substance Use Topics  . Alcohol use: No  ALLERGIES:  Allergies  Allergen Reactions  . Codeine Other (See Comments)    Difficult breathing and skin problem  . Ultram [Tramadol] Other (See Comments)    Difficult breathing and skin peeling  . Januvia [Sitagliptin] Rash and Other (See Comments)    Blisters     PERTINENT MEDICATIONS:  Outpatient Encounter Medications as of 09/23/2018  Medication Sig  . acetaminophen (TYLENOL) 325 MG tablet Take 650 mg by mouth every 6 (six) hours as needed for mild pain or fever.   . Amino Acids-Protein Hydrolys (FEEDING  SUPPLEMENT, PRO-STAT SUGAR FREE 64,) LIQD Take 30 mLs by mouth 2 (two) times daily.  . ARTIFICIAL TEAR OP Apply 1 drop to eye 4 (four) times daily as needed (dry eyes).   . baclofen (LIORESAL) 10 MG tablet Take 10-20 mg by mouth See admin instructions. Take 1.5 tablets (15 mg) by mouth in the morning for spasms, take 1 tablet (10 mg) by mouth midday, & take 2 tablets (20 mg) by mouth at bedtime.  . calcium citrate (CALCITRATE - DOSED IN MG ELEMENTAL CALCIUM) 950 MG tablet Take 200 mg of elemental calcium by mouth daily. (0800)  . Carboxymeth-Glycerin-Polysorb (REFRESH OPTIVE ADVANCED) 0.5-1-0.5 % SOLN Apply 1 drop to eye 4 (four) times daily. (0900, 1300, 1700, & 2100)  . cholecalciferol (VITAMIN D) 1000 UNITS tablet Take 1,000 Units by mouth daily. (0800)  . cycloSPORINE (RESTASIS) 0.05 % ophthalmic emulsion Place 1 drop into both eyes 2 (two) times daily. (0900 & 1700)  . Dimethyl Fumarate (TECFIDERA) 240 MG CPDR Take 1 capsule (240 mg total) by mouth 2 (two) times daily.  . DULoxetine (CYMBALTA) 30 MG capsule Take 30 mg by mouth daily. Give along with 60 mg to = 90 mg  . DULoxetine (CYMBALTA) 60 MG capsule Take 60 mg by mouth daily. Give along with 30 mg to = 90 mg  . gabapentin (NEURONTIN) 300 MG capsule Take 1 capsule (300 mg total) by mouth 2 (two) times daily.  Marland Kitchen guaiFENesin (MUCINEX) 600 MG 12 hr tablet Take 1,200 mg by mouth 2 (two) times daily. (0900 & 1700)  . Insulin Glargine (LANTUS SOLOSTAR) 100 UNIT/ML Solostar Pen LANTUS SOLOSTAR 100 UNIT/ML INJECT 22 UNITS SUBCUTANEOUSLY AT BEDTIME (PRIME PEN WITH 2 UNITS PRIOR TO EACH USE - DO NOT MIX WITH ANY  . insulin lispro (HUMALOG) 100 UNIT/ML injection Inject 5-10 Units into the skin 3 (three) times daily before meals. Sliding Scale Insulin If CBG 200-300 = 5 units, 300-400 = 10 units, greater than 400 call provider  . ipratropium-albuterol (DUONEB) 0.5-2.5 (3) MG/3ML SOLN Take 3 mLs by nebulization every 6 (six) hours as needed  (wheezing/coughing).   Marland Kitchen linaclotide (LINZESS) 72 MCG capsule Take 72 mcg by mouth every morning.  . methimazole (TAPAZOLE) 5 MG tablet Take 1 tablet (5 mg total) by mouth daily.  . Multiple Vitamin (MULTIVITAMIN WITH MINERALS) TABS tablet Take 1 tablet by mouth daily. (0900)  . pantoprazole (PROTONIX) 40 MG tablet Take 40 mg by mouth daily at 6 (six) AM.   . polyethylene glycol (MIRALAX / GLYCOLAX) packet Take 17 g by mouth daily as needed (constipation.).   Marland Kitchen predniSONE (DELTASONE) 5 MG tablet 1 tablet on odd days, one half tablet on even days  . senna-docusate (SENOKOT-S) 8.6-50 MG tablet Take 2 tablets by mouth at bedtime. (2000)  . Skin Protectants, Misc. (MINERIN) CREA MINERIN CREME APPLY 1 APPLICATION TOPICALLY TWICE A DAY  . solifenacin (VESICARE) 10 MG tablet Take 10 mg by mouth daily. (  0900)   Facility-Administered Encounter Medications as of 09/23/2018  Medication  . botulinum toxin Type A (BOTOX) injection 200 Units     Amy Jenetta Downer, NP

## 2018-10-29 ENCOUNTER — Encounter: Payer: Self-pay | Admitting: Internal Medicine

## 2018-10-29 ENCOUNTER — Non-Acute Institutional Stay (SKILLED_NURSING_FACILITY): Payer: Medicare Other | Admitting: Internal Medicine

## 2018-10-29 DIAGNOSIS — E119 Type 2 diabetes mellitus without complications: Secondary | ICD-10-CM

## 2018-10-29 DIAGNOSIS — G35 Multiple sclerosis: Secondary | ICD-10-CM

## 2018-10-29 DIAGNOSIS — R197 Diarrhea, unspecified: Secondary | ICD-10-CM

## 2018-10-29 DIAGNOSIS — B351 Tinea unguium: Secondary | ICD-10-CM | POA: Diagnosis not present

## 2018-10-29 DIAGNOSIS — Z794 Long term (current) use of insulin: Secondary | ICD-10-CM

## 2018-10-29 NOTE — Assessment & Plan Note (Addendum)
Clinically stable.  Neurology follow-up with [facility] 11/17/2018.

## 2018-10-29 NOTE — Patient Instructions (Signed)
See assessment and plan under each diagnosis in the problem list and acutely for this visit 

## 2018-10-29 NOTE — Assessment & Plan Note (Addendum)
09/04/2018 A1c 7.9% indicating adequate control.  Patient is employing non-sugar sweeteners.  Low-dose steroids for adrenal cortical insufficiency should not be making a significant impact on diabetic control.

## 2018-10-29 NOTE — Progress Notes (Signed)
NURSING HOME LOCATION:  [facility] ROOM NUMBER:  [id number]  CODE STATUS: DNR    PCP: [name] MD.    This is a nursing facility follow up for specific acute issue of wound of L great toe.  Interim medical record and care since last [place] visit was updated with review of diagnostic studies and change in clinical status since last visit were documented.  HPI: Several months ago the patient placed a toe protector over the left great toe and nail; she believes this was in January.  She had worn this device 24/7.  [facility] NP and wound care nurse noted tissue maceration and purulent material draining from the nail 4/24.The patient has no feeling in the lower extremities and was unaware of the process.  Onychomycosis was felt to be present in the context of diabetes.  The occlusive device was removed and patient treated with Keflex and topical antifungal.  The purulent drainage has resolved and swelling and ecchymotic changes have also improved according to the Wound Care Nurse. Glucoses have been markedly variable; she is on basal insulin 22 units at bedtime.  She is also on a sliding scale if pre-meal glucoses are over 200.  A1c is current; it was 7.9% on 09/04/2018.  She is on low-dose prednisone as prevention for adrenal cortical insufficiency. She is on methimazole for hyperthyroidism and has a follow-up endocrinology appointment with Dr. Loanne Drilling 8/18. Neurology follow-up of her MS is 5/19 with [facility].  Review of systems:  The patient is on baclofen for muscle spasms. She describes diarrhea as frankly watery stool.  She changed from 2% milk to lactose-free milk without improvement in the stool consistency.  She had requested that 1 of the stool softeners to be discontinued.  She is also on Linzess which was prescribed for chronic constipation. She describes some abdominal tightness related to Botox injections at the time her suprapubic catheter was changed.   Constitutional: No fever, significant weight change, fatigue  Eyes: No redness, discharge, pain, vision change ENT/mouth: No nasal congestion,  purulent discharge, earache, change in hearing, sore throat  Cardiovascular: No chest pain, palpitations, paroxysmal nocturnal dyspnea, claudication, edema  Respiratory: No cough, sputum production, hemoptysis, DOE, significant snoring, apnea   Gastrointestinal: No heartburn, dysphagia,nausea /vomiting, rectal bleeding, melena Musculoskeletal: No joint stiffness, joint swelling Dermatologic: No rash, pruritus, change in appearance of skin Neurologic: No dizziness, headache, syncope, seizures Psychiatric: No significant anxiety, depression, insomnia, anorexia Endocrine: No change in hair/skin/nails, excessive thirst, excessive hunger, excessive urination  Hematologic/lymphatic: No significant bruising, lymphadenopathy, abnormal bleeding Allergy/immunology: No itchy/watery eyes, significant sneezing, urticaria, angioedema  Physical exam:  Pertinent or positive findings: She is bedridden due to her neurologic condition.  Arcus senilis is present.  Dental hygiene is good.  She exhibits a slight tachycardia.  Chest is clear anteriorly.  Bowel sounds are decreased.  Suprapubic catheter is present.  Urine is yellow and clear without sediment.  She has nonpitting edema of the feet but does have a 1+ indention at the superior sock line.  She is wearing socks which leave the toes exposed.  Posterior tibial pulses decreased.  There is granulation tissue at the tip of the left great toe distally.  There is ecchymosis at the base of the nail.  The nail itself is markedly deformed and thickened.  The right great toenail is also thickened and deformed without associated skin changes.  With palpation of the right foot dorsally, the great toe was upgoing.  General  appearance: Adequately nourished; no acute distress, increased work of breathing is present.   Lymphatic: No  lymphadenopathy about the head, neck, axilla. Eyes: No conjunctival inflammation or lid edema is present. There is no scleral icterus. Ears:  External ear exam shows no significant lesions or deformities.   Nose:  External nasal examination shows no deformity or inflammation. Nasal mucosa are pink and moist without lesions, exudates Oral exam:  Lips and gums are healthy appearing. There is no oropharyngeal erythema or exudate. Neck:  No thyromegaly, masses, tenderness noted.    Heart:  No gallop, murmur, click, rub .  Lungs:  without wheezes, rhonchi, rales, rubs. Abdomen:Abdomen is soft and nontender with no organomegaly, hernias, masses. GU: Deferred  Extremities:  No cyanosis, clubbing  Neurologic exam : Balance, Rhomberg, finger to nose testing could not be completed due to clinical state Skin: Warm & dry w/o tenting. No significant rash.  See summary under each active problem in the Problem List with associated updated therapeutic plan

## 2018-11-16 ENCOUNTER — Telehealth: Payer: Self-pay | Admitting: Neurology

## 2018-11-16 NOTE — Telephone Encounter (Signed)
Noted  

## 2018-11-16 NOTE — Telephone Encounter (Signed)
[facility] called asked if the appointment for 05-19 could be rescheduled due to the restrictions they have on taking certain patients out of the facility.  Hilda Blades asked that the in office appointment be r/s for 06-15. No call back requested

## 2018-11-17 ENCOUNTER — Ambulatory Visit: Payer: Medicare Other | Admitting: Neurology

## 2018-12-10 NOTE — Telephone Encounter (Signed)
Got it, thank you!   ___________________________ [email or web address] [phone]

## 2018-12-10 NOTE — Telephone Encounter (Signed)
I called [employer] and also pt home # ([email or web address]).  I called [facility], Consented to [email or web address], she would let pt know.  Due to current COVID 19 pandemic, our office is severely reducing in office visits until further notice, in order to minimize the risk to our patients and healthcare providers.  Pt understands that although there may be some limitations with this type of visit, we will take all precautions to reduce any security or privacy concerns.  Pt understands that this will be treated like an in office visit and we will file with pt's insurance, and there may be a patient responsible charge related to this service. Email sent to [email or web address] and also [email or web address].

## 2018-12-13 NOTE — Progress Notes (Signed)
Virtual Visit via Video Note  I connected with Laura Roberts on 12/14/18 at  2:15 PM EDT by a video enabled telemedicine application and verified that I am speaking with the correct person using two identifiers.  Location: Patient: [facility] Re[facility] Provider: In the office    I discussed the limitations of evaluation and management by telemedicine and the availability of in person appointments. The patient expressed understanding and agreed to proceed.  History of Present Illness: 12/14/2018 SS: [name] is a 78 year old female with history of multiple sclerosis associated with paraplegia.  She is currently residing at [facility] after a sacral decubitus ulcer in 2018.  She is currently taking Tecfidera twice daily.  She is tolerating the medication well.  She has a neurogenic bladder with a suprapubic catheter.  She had MRI of the brain and cervical spine in September 2018, findings were relatively stable compared with 2015.  She denies any new problems or concerns.  She reports her decubitus ulcer has healed.  She does operate an Clinical research associate.  She is not able to use her legs.  She is able to use her right and left arms, but her mobility is limited in her left arm due to her rotator cuff.  She denies any new numbness or weakness.  She denies any vision changes.  She reports her appetite is good and she is able to feed herself.  She is involved in restorative therapy, where the staff performs passive range of motion on her extremities.  She does not have any open wounds at current.  She reports some spasticity in her arms and legs, adequately controlled with baclofen.  05/19/2018 Dr.Willis: [name] is a 78 year old right-handed black female with a history of multiple sclerosis associated with the paraplegia.  The patient has essentially no motor or sensory function in the legs.  She requires a motorized wheelchair for mobility.  The patient currently is living in the  [facility] following an admission to the hospital for a severe urinary tract infection.  The patient overall is stable with her multiple sclerosis, she does not have good use of the left arm because of a severe degenerative arthritis problem in the left shoulder.  She cannot lift the left arm up.  She has severe pain in the shoulder.  She has seen an orthopedic surgeon, she has gotten an injection with minimal benefit.  The patient has a suprapubic catheter in place, she has not had any recent bladder infections.  The patient does have some spasm in the legs, this is not painful for her, she does take baclofen.  The patient is sleeping fairly well.  She denies any new numbness or weakness or vision changes with her MS.  She is on Tecfidera, she tolerates the medication well.   Observations/Objective: Alert, answers questions appropriately, very pleasant, speech is clear and concise, follows commands, facial symmetry noted, symmetric movements of her eyes, Left arm limited ROM rotator cuff, no arm drift, unable to ambulate, bedbound, uses electric wheelchair  Assessment and Plan: 1.  Multiple sclerosis  She indicates she is doing quite well, she has not had recent exacerbation.  She will continue taking Tecfidera, I will check lab work.  We will fax the lab orders to [facility] rehabilitation center to be collected.  She will follow-up in 6 months or sooner if needed.  Follow Up Instructions: 6 months 06/16/2019 10:15 am   I discussed the assessment and treatment plan  with the patient. The patient was provided an opportunity to ask questions and all were answered. The patient agreed with the plan and demonstrated an understanding of the instructions.   The patient was advised to call back or seek an in-person evaluation if the symptoms worsen or if the condition fails to improve as anticipated.  I provided 15 minutes of non-face-to-face time during this encounter.   Evangeline Dakin, DNP  [facility] [address] [place],  [phone]

## 2018-12-14 ENCOUNTER — Telehealth: Payer: Self-pay | Admitting: Neurology

## 2018-12-14 ENCOUNTER — Encounter: Payer: Self-pay | Admitting: Neurology

## 2018-12-14 ENCOUNTER — Ambulatory Visit (INDEPENDENT_AMBULATORY_CARE_PROVIDER_SITE_OTHER): Payer: Medicare Other | Admitting: Neurology

## 2018-12-14 ENCOUNTER — Other Ambulatory Visit: Payer: Self-pay

## 2018-12-14 DIAGNOSIS — G35 Multiple sclerosis: Secondary | ICD-10-CM | POA: Diagnosis not present

## 2018-12-14 NOTE — Telephone Encounter (Signed)
Can we fax orders for lab work, CBC with diff, CMP to [facility] rehabilitation center. Fax # [phone]. She is taking Tecfidera for MS.

## 2018-12-14 NOTE — Telephone Encounter (Signed)
Fax confirmation received to [facility] rehab [phone] for lab requests CMP/ CBC w/d/plts.

## 2018-12-14 NOTE — Progress Notes (Signed)
I have read the note, and I agree with the clinical assessment and plan.  Charles K Willis   

## 2018-12-17 ENCOUNTER — Other Ambulatory Visit: Payer: Self-pay | Admitting: Neurology

## 2018-12-17 MED ORDER — TECFIDERA 240 MG PO CPDR: 240.0000 mg | DELAYED_RELEASE_CAPSULE | Freq: Two times a day (BID) | ORAL | 11 refills | Status: DC

## 2018-12-18 LAB — CBC AND DIFFERENTIAL
HCT: 39 (ref 36–46)
Hemoglobin: 12.6 (ref 12.0–16.0)
Platelets: 385 (ref 150–399)
WBC: 10.7

## 2018-12-18 LAB — HEPATIC FUNCTION PANEL
ALT: 11 (ref 7–35)
AST: 10 — AB (ref 13–35)

## 2018-12-18 LAB — BASIC METABOLIC PANEL
BUN: 111 — AB (ref 4–21)
Creatinine: 0.6 (ref 0.5–1.1)
Glucose: 175
Potassium: 5.3 (ref 3.4–5.3)
Sodium: 141 (ref 137–147)

## 2018-12-21 ENCOUNTER — Telehealth: Payer: Self-pay | Admitting: *Deleted

## 2018-12-21 ENCOUNTER — Telehealth: Payer: Self-pay | Admitting: Neurology

## 2018-12-21 NOTE — Telephone Encounter (Signed)
Received lab reports form [facility]. Placed on Rhea Pink, NP's desk for review.

## 2018-12-21 NOTE — Telephone Encounter (Signed)
I received lab work from [facility] regarding patient receiving Tecfidera for MS. labs look good, absolute lymphocyte 1.2, WBC 10.7, neutrophils mildly elevated at 8.8.  Glucose 175, potassium mildly elevated at 5.3, liver function within normal limits.  Please ensure primary care has access to these labs. I believe her PCP is Dr. Linna Darner with [facility].

## 2018-12-21 NOTE — Telephone Encounter (Signed)
Spoke with [facility] rehab and the patient's labs were given to Durenda Age, NP and Dr. Unice Cobble to look over.

## 2018-12-21 NOTE — Telephone Encounter (Signed)
Reviewed the laboratory evaluation from [facility] clinic stated December 18, 2018, CMP showed elevated glucose 175 showed hemoglobin of 12.6, creatinine of 0.6

## 2018-12-21 NOTE — Telephone Encounter (Signed)
Unable to complete tecfidera PA on CMM, key - [id number]; CMM stated patient is inactive. Called [employer] and was told patient's medicare insurance ID# [id number] has been inactive since 03/30/2018, no medicare coverage at this time. The rep was unable to find patient under her Medicaid ID- [id number] T.  Called patient and requested call back to give [place] her new insurance information. I informed her that I was unable to complete PA for her Tecfidera with our current insurance information.

## 2018-12-24 ENCOUNTER — Telehealth: Payer: Self-pay | Admitting: *Deleted

## 2018-12-24 NOTE — Telephone Encounter (Signed)
I spoke to Neoma Laming at [facility], trying to get updated insurance information, was given [phone] as pts cell #, not her #.  Relayed to Neoma Laming and she will call me with pts new number when she can, was doing pt care at this time.  I emailed Tillie Rung asking for updated insurance information, if she can give to me.

## 2019-01-06 ENCOUNTER — Encounter: Payer: Self-pay | Admitting: Internal Medicine

## 2019-01-14 ENCOUNTER — Encounter: Payer: Self-pay | Admitting: Internal Medicine

## 2019-01-14 ENCOUNTER — Non-Acute Institutional Stay (SKILLED_NURSING_FACILITY): Payer: Medicare Other | Admitting: Internal Medicine

## 2019-01-14 DIAGNOSIS — Z794 Long term (current) use of insulin: Secondary | ICD-10-CM

## 2019-01-14 DIAGNOSIS — G8220 Paraplegia, unspecified: Secondary | ICD-10-CM

## 2019-01-14 DIAGNOSIS — F339 Major depressive disorder, recurrent, unspecified: Secondary | ICD-10-CM | POA: Diagnosis not present

## 2019-01-14 DIAGNOSIS — E1140 Type 2 diabetes mellitus with diabetic neuropathy, unspecified: Secondary | ICD-10-CM

## 2019-01-14 DIAGNOSIS — Z9359 Other cystostomy status: Secondary | ICD-10-CM

## 2019-01-14 NOTE — Assessment & Plan Note (Addendum)
The role of high fructose corn syrup in foods and beverages discussed with her again.  A1c will need to be repeated as soon as the quarantine is lifted.  [facility] NP and I can review possibly increasing her basal insulin as fasting glucoses preferably should be below 200. Role of uncontrolled DM in recurrent infections reviewed

## 2019-01-14 NOTE — Progress Notes (Signed)
NURSING HOME LOCATION:  [facility] ROOM NUMBER:  [id number]  CODE STATUS:  DNR  PCP:  [name], MD  [place] [address]   This is a nursing facility follow up of chronic medical diagnoses.    Interim medical record and care since last [place] visit was updated with review of diagnostic studies and change in clinical status since last visit were documented.  HPI: She is a permanent resident of the facility with medical diagnoses of MS, insulin-dependent diabetes, reactive airways disease, chronic constipation, vitamin D deficiency, spinal stenosis at multiple levels, adrenocortical insufficiency, nontoxic multinodular goiter, GERD, essential hypertension, dyslipidemia, and neurogenic bladder. Surgeries include hysterectomy, cholecystectomy, suprapubic bladder placement, and bilateral TKA.  Family history was reviewed; it is non-contributory except for MS among 2 nieces.  Review of systems: She states she is attempting to make better snack choices changing from Oreo cookies to [employer] which have less sugar content.  Morning sugars have ranged from 219 up to 274 and evening sugars 181-272 indicating suboptimal control.  She is on both basal as well as pre-meal insulin.  A1c is due for repeat.  On 3/6 value was 7.9%, indicating adequate control.  She is on low-dose steroids which may be playing a role in the hyperglycemia. She states that her last neurology visit was a virtual visit and no changes were made in her therapy.   She is now on antibiotics for cellulitis around the suprapubic catheter with presentation as bleeding and erythema.  She denies any constitutional symptoms but describes getting "hot" when she takes her MS medication.  She is on lubricating drops for dry eyes.  She has decreased range of motion of the left upper extremity due to rotator cuff symptoms.  She is appreciative of the restorative therapy by OT for her legs and  feet.Her situation is anxiety inducing & depressing ,but she has strong faith & family support.  Constitutional: No fever, significant weight change, fatigue  Eyes: No redness, discharge, pain, vision change ENT/mouth: No nasal congestion,  purulent discharge, earache, change in hearing, sore throat  Cardiovascular: No chest pain, palpitations, paroxysmal nocturnal dyspnea, claudication, edema  Respiratory: No cough, sputum production, hemoptysis, DOE, significant snoring, apnea   Gastrointestinal: No heartburn, dysphagia, abdominal pain, nausea /vomiting, rectal bleeding, melena, change in bowels Genitourinary: No  hematuria, pyuria Dermatologic: No new rash, pruritus, change in appearance of skin Neurologic: No dizziness, headache, syncope, seizures Psychiatric: No significant insomnia, anorexia Endocrine: No change in hair/nails, excessive thirst, excessive hunger Hematologic/lymphatic: No significant bruising, lymphadenopathy, abnormal bleeding Allergy/immunology: No itchy/watery eyes, significant sneezing, urticaria, angioedema  Physical exam:  Pertinent or positive findings: Arcus senilis is present.  Suprapubic catheter present. 1+ edema is present over the lower extremities.  She has no movement in the lower extremities.  The lower extremities are in booties.  The left upper extremity is also weak which she attributes to her rotator cuff issues.  General appearance: Adequately nourished; no acute distress, increased work of breathing is present.   Lymphatic: No lymphadenopathy about the head, neck, axilla. Eyes: No conjunctival inflammation or lid edema is present. There is no scleral icterus. Ears:  External ear exam shows no significant lesions or deformities.   Nose:  External nasal examination shows no deformity or inflammation. Nasal mucosa are pink and moist without lesions, exudates Oral exam:  Lips and gums are healthy appearing. There is no oropharyngeal erythema or exudate.  Neck:  No thyromegaly, masses, tenderness  noted.    Heart:  Normal rate and regular rhythm. S1 and S2 normal without gallop, murmur, click, rub .  Lungs: Chest clear to auscultation without wheezes, rhonchi, rales, rubs. Abdomen: Bowel sounds are normal. Abdomen is soft and nontender with no organomegaly, hernias, masses. GU: Deferred  Extremities:  No cyanosis, clubbing  Neurologic exam : Balance, Rhomberg, finger to nose testing could not be completed due to clinical state Skin: Warm & dry w/o tenting. No significant lesions or rash.  See summary under each active problem in the Problem List with associated updated therapeutic plan

## 2019-01-14 NOTE — Assessment & Plan Note (Signed)
She describes both anxiety and depression due to her MS and other comorbidities.  Psych NP can be consulted as she is already on high-dose Cymbalta.  This agent also may help any neuropathic pain

## 2019-01-14 NOTE — Patient Instructions (Signed)
See assessment and plan under each diagnosis in the problem list and acutely for this visit 

## 2019-01-14 NOTE — Assessment & Plan Note (Addendum)
Catheter site exhibited bleeding and cellulitis prompting initiation of antibiotics.  At present these findings have improved and there is no purulence or other constitutional symptoms. DM control essential to prevent recurrence

## 2019-01-15 NOTE — Assessment & Plan Note (Signed)
Continue Restorative Care by OT

## 2019-01-18 LAB — BASIC METABOLIC PANEL
BUN: 14 (ref 4–21)
Creatinine: 0.7 (ref 0.5–1.1)
Glucose: 214
Potassium: 3.7 (ref 3.4–5.3)
Sodium: 142 (ref 137–147)

## 2019-02-12 ENCOUNTER — Other Ambulatory Visit: Payer: Self-pay

## 2019-02-16 ENCOUNTER — Ambulatory Visit: Payer: Medicare Other | Admitting: Endocrinology

## 2019-03-25 ENCOUNTER — Encounter: Payer: Self-pay | Admitting: Internal Medicine

## 2019-03-25 ENCOUNTER — Other Ambulatory Visit: Payer: Self-pay | Admitting: Internal Medicine

## 2019-03-25 ENCOUNTER — Non-Acute Institutional Stay (SKILLED_NURSING_FACILITY): Payer: Medicare Other | Admitting: Internal Medicine

## 2019-03-25 DIAGNOSIS — E1140 Type 2 diabetes mellitus with diabetic neuropathy, unspecified: Secondary | ICD-10-CM | POA: Diagnosis not present

## 2019-03-25 DIAGNOSIS — I10 Essential (primary) hypertension: Secondary | ICD-10-CM

## 2019-03-25 DIAGNOSIS — G35 Multiple sclerosis: Secondary | ICD-10-CM | POA: Diagnosis not present

## 2019-03-25 DIAGNOSIS — E1165 Type 2 diabetes mellitus with hyperglycemia: Secondary | ICD-10-CM

## 2019-03-25 DIAGNOSIS — Z794 Long term (current) use of insulin: Secondary | ICD-10-CM

## 2019-03-25 NOTE — Assessment & Plan Note (Addendum)
Read all labels ; consume LESS than 40 grams of sugar / day from foods & drinks with  High Fructose Corn Syrup as #1, 2 , 3 or # 4 on label. (Note : dividing the "grams of sugar" on label by 4 gives "teaspoons of sugar" content of food or drink. For example a 22 oz Coke has 68 grams of sugar or 17 tsp of sugar). The immediate risk of uncontrolled diabetes is infection, especially COVID-19.   Also every 1% rise in your A1c above 7 increases the risk of heart attack or stroke by 20%. A1c will be updated as she has a follow-up Endocrinology appointment 10/5 with Dr. Loanne Drilling.

## 2019-03-25 NOTE — Assessment & Plan Note (Signed)
BP controlled; no change in antihypertensive medications  

## 2019-03-25 NOTE — Patient Instructions (Signed)
See assessment and plan under each diagnosis in the problem list and acutely for this visit 

## 2019-03-25 NOTE — Progress Notes (Signed)
NURSING HOME LOCATION:  [facility] ROOM NUMBER:  [id number]  CODE STATUS:  DNR  PCP:  Pascal Lux MD  This is a nursing facility follow up for specific acute issue of poorly controlled DM.  Interim medical record and care since last [place] visit was updated with review of diagnostic studies and change in clinical status since last visit were documented.  HPI:The [facility] NP reported she has had glucoses > 400 intermittently.  The most recent A1c in Epic was on 3/6 with a value of 7.9%.  She did have a BMET performed 7/20 which was normal.  She has a follow-up appointment with Dr. Loanne Drilling, Endocrinology on 10/5. She has extensive spinal stenosis at the cervical, thoracic, and lumbosacral areas.  She also has underlying MS.  Neurology follow-up is scheduled 06/16/2019.  Review of systems: She denies any symptoms related to her diabetes.  She did have a blister due to friction from changing her diaper.  Numbness and tingling is unchanged.  She also has a history of carpal tunnel syndrome.  Surgery was scheduled on the right hand but was canceled when the MS was diagnosed.  Her peripheral edema has improved with Lasix.  She has an occasional cough with clear sputum.  She validates that she does snore but denies any history of sleep apnea.  She receives Botox injections for muscular spasms. She has no symptoms of COVID.  She is tested weekly.  Constitutional: No fever, significant weight change, fatigue  Eyes: No redness, discharge, pain, vision change ENT/mouth: No nasal congestion,  purulent discharge, earache, change in hearing, sore throat  Cardiovascular: No chest pain, palpitations, paroxysmal nocturnal dyspnea, claudication  Respiratory: No hemoptysis,  apnea   Gastrointestinal: No heartburn, dysphagia, abdominal pain, nausea /vomiting, rectal bleeding, melena, change in bowels Genitourinary: No dysuria, hematuria, pyuria Musculoskeletal: No joint stiffness, joint swelling,  weakness, pain Dermatologic: No rash, pruritus, change in appearance of skin other than blister Neurologic: No dizziness, headache, syncope, seizures Psychiatric: No significant anxiety, depression, insomnia, anorexia Endocrine: No change in hair, excessive thirst, excessive hunger, excessive urination  Hematologic/lymphatic: No significant bruising, lymphadenopathy, abnormal bleeding Allergy/immunology: No itchy/watery eyes, significant sneezing, urticaria, angioedema  Physical exam:  Pertinent or positive findings: She appears much younger than her stated age.  Arcus senilis is present.  Dental hygiene is good.  Breath sounds are decreased.  Abdomen is protuberant.  Pedal pulses are decreased.  She has minimal peripheral edema of the lower extremities.  The lower extremities are in booties.  She has no strength or range of motion of the lower extremities.  The right great toenail is thickened distally and partially avulsed.  Some of the toenails are elongated.  She has a partial flexion contracture of the third right finger.  Slight clubbing of the fingernails is suggested.  General appearance: Adequately nourished; no acute distress, increased work of breathing is present.   Lymphatic: No lymphadenopathy about the head, neck, axilla. Eyes: No conjunctival inflammation or lid edema is present. There is no scleral icterus. Ears:  External ear exam shows no significant lesions or deformities.   Nose:  External nasal examination shows no deformity or inflammation. Nasal mucosa are pink and moist without lesions, exudates Oral exam:  Lips and gums are healthy appearing. There is no oropharyngeal erythema or exudate. Neck:  No thyromegaly, masses, tenderness noted.    Heart:  Normal rate and regular rhythm. S1 and S2 normal without gallop, murmur, click, rub .  Lungs:  without  wheezes, rhonchi, rales, rubs. Abdomen: Bowel sounds are normal. Abdomen is soft and nontender with no organomegaly,  hernias, masses. GU: Deferred  Extremities:  No cyanosis  Neurologic exam : Balance, Rhomberg, finger to nose testing could not be completed due to clinical state Skin: Warm & dry w/o tenting. No significant lesions or rash.  See summary under each active problem in the Problem List with associated updated therapeutic plan

## 2019-03-25 NOTE — Assessment & Plan Note (Addendum)
No change in her numbness and tingling or other neuromuscular symptoms.  Neurology follow-up is scheduled for 12/16.

## 2019-03-30 LAB — HEMOGLOBIN A1C
Hemoglobin A1C: 9.7
Hemoglobin A1C: 9.7

## 2019-04-01 ENCOUNTER — Encounter: Payer: Self-pay | Admitting: Internal Medicine

## 2019-04-01 ENCOUNTER — Non-Acute Institutional Stay (SKILLED_NURSING_FACILITY): Payer: Medicare Other | Admitting: Internal Medicine

## 2019-04-01 DIAGNOSIS — E1140 Type 2 diabetes mellitus with diabetic neuropathy, unspecified: Secondary | ICD-10-CM

## 2019-04-01 DIAGNOSIS — Z794 Long term (current) use of insulin: Secondary | ICD-10-CM | POA: Diagnosis not present

## 2019-04-01 NOTE — Assessment & Plan Note (Addendum)
The mean glucose associated with A1c of 9.7% and its increased risk discussed with her frankly.  She is intelligent and grasps the significance and plans to make positive interventions, mainly avoiding excess sweets snacks from the snack cart.  She has already canceled her endocrinologic consultation. [facility] NP has increased the basal Lantus insulin and the sliding scale coverage.

## 2019-04-01 NOTE — Patient Instructions (Signed)
See assessment and plan under diagnosis in the problem list  for this visit

## 2019-04-01 NOTE — Progress Notes (Signed)
   NURSING HOME LOCATION:  [facility] ROOM NUMBER:  202 [id number]  CODE STATUS:  DNR  PCP:  Pascal Lux MD [place] [address]  This is a nursing facility follow up for specific acute issue of uncontrolled DM.  Interim medical record and care since last [place] visit was updated with review of diagnostic studies and change in clinical status since last visit were documented.  HPI: [facility] NP reports that her A1c has increased to 9.7%; it had been 7.9 on 09/04/2018.  This result was anticipated as the [facility] NP has noted significant intake of sweets snacks from the snack cart on multiple occasions. This A1c would be associated with a mean glucose of greater than 200 and approximately 54% increased risk of heart attack or stroke.   There is no family history of heart attack or stroke but her mother had diabetes. Endocrinology appointment had been scheduled with Dr. Loanne Drilling for 10/5.  Review of systems: She states that she has discussed the situation with the Nutritionist and feels that she can make a major difference in control of her DM. She tells me she has canceled the appointment with the Endocrinologist with that in mind.  She denies any diabetic related symptoms.  She has a Foley and cannot tell if she has excessive urination.  She denies any active skin lesions/ wounds.  She denies any change in her neurologic state.  Her major symptoms are not numbness and tingling but spasms for which she takes Botox.  Constitutional: No fever, significant weight change, fatigue  Cardiovascular: No chest pain, palpitations, paroxysmal nocturnal dyspnea, claudication, edema  Respiratory: No cough, sputum production, hemoptysis, DOE, significant snoring, apnea   Gastrointestinal: No heartburn, dysphagia, abdominal pain, nausea /vomiting, rectal bleeding, melena, change in bowels Neurologic: No dizziness, headache, syncope, seizures Psychiatric: No significant anxiety,  depression, insomnia, anorexia Endocrine: No change in hair/skin/nails, excessive thirst, excessive hunger Hematologic/lymphatic: No significant bruising, lymphadenopathy, abnormal bleeding  Physical exam:  Pertinent or positive findings: She is an IT trainer wheelchair.  Breath sounds are decreased.  Abdomen is protuberant.  Central obesity is present. She has no movement in the lower extremities.  Pedal pulses are not palpable.  General appearance: Adequately nourished; no acute distress, increased work of breathing is present.   Lymphatic: No lymphadenopathy about the head, neck, axilla. Eyes: No conjunctival inflammation or lid edema is present. There is no scleral icterus. Heart:  Normal rate and regular rhythm. S1 and S2 normal without gallop, murmur, click, rub .  Lungs: without wheezes, rhonchi, rales, rubs. Abdomen: Bowel sounds are normal. Abdomen is soft and nontender with no organomegaly, hernias, masses. GU: Deferred  Extremities:  No cyanosis, clubbing  Neurologic exam : Balance, Rhomberg, finger to nose testing could not be completed due to clinical state Deep tendon reflexes are equal Skin: Warm & dry w/o tenting. No significant lesions or rash.  See summary under each active problem in the Problem List with associated updated therapeutic plan

## 2019-04-05 ENCOUNTER — Ambulatory Visit: Payer: Medicare Other | Admitting: Endocrinology

## 2019-04-06 LAB — BASIC METABOLIC PANEL
BUN: 14 (ref 4–21)
CO2: 31 — AB (ref 13–22)
Chloride: 97 — AB (ref 99–108)
Creatinine: 0.6 (ref 0.5–1.1)
Glucose: 177
Potassium: 4.5 (ref 3.4–5.3)
Sodium: 141 (ref 137–147)

## 2019-04-06 LAB — COMPREHENSIVE METABOLIC PANEL
Calcium: 9.9 (ref 8.7–10.7)
GFR calc Af Amer: 90
GFR calc non Af Amer: 89.06

## 2019-04-06 LAB — LIPID PANEL
Cholesterol: 179 (ref 0–200)
HDL: 64 (ref 35–70)
LDL Cholesterol: 97
LDl/HDL Ratio: 2.8
Triglycerides: 91 (ref 40–160)

## 2019-04-06 LAB — TSH: TSH: 1.54 (ref 0.41–5.90)

## 2019-04-20 ENCOUNTER — Telehealth: Payer: Self-pay

## 2019-04-20 MED ORDER — TECFIDERA 240 MG PO CPDR: 240.0000 mg | DELAYED_RELEASE_CAPSULE | Freq: Two times a day (BID) | ORAL | 11 refills | Status: DC

## 2019-04-20 NOTE — Telephone Encounter (Signed)
I spoke to pt and she is wanting the BN tecfidera.  Will place prescription as a DAW.

## 2019-04-20 NOTE — Telephone Encounter (Signed)
I called [facility] [phone] no answer.  Pt resides there at this time.  ? What they are giving her there?

## 2019-04-20 NOTE — Telephone Encounter (Signed)
Debra from [facility] is stating pt is requesting brand name refill on tecfidera only. Contact number for Hilda Blades is [phone]. She wanted this message sent as urgent.

## 2019-04-20 NOTE — Addendum Note (Signed)
Addended by: Brandon Melnick on: 04/20/2019 04:15 PM   Modules accepted: Orders

## 2019-04-20 NOTE — Telephone Encounter (Signed)
I called [facility].  I think they need prescription for TECFIDERA (BN) DAW, since the generic form is now available since 03-03-19.  Ok to do?

## 2019-04-21 ENCOUNTER — Other Ambulatory Visit: Payer: Self-pay | Admitting: Urology

## 2019-05-07 ENCOUNTER — Encounter (HOSPITAL_COMMUNITY): Payer: Self-pay

## 2019-05-07 NOTE — Progress Notes (Signed)
SPOKE W/  Laura Roberts     SCREENING SYMPTOMS OF COVID 19:   COUGH--NO  RUNNY NOSE--- NO  SORE THROAT---NO  NASAL CONGESTION----NO  SNEEZING----NO  SHORTNESS OF BREATH---NO  DIFFICULTY BREATHING---NO  TEMP >100.0 -----NO  UNEXPLAINED BODY ACHES------NO  CHILLS -------- NO  HEADACHES ---------NO  LOSS OF SMELL/ TASTE --------NO    HAVE YOU OR ANY FAMILY MEMBER TRAVELLED PAST 14 DAYS OUT OF THE   COUNTY---NO STATE----NO COUNTRY----NO  HAVE YOU OR ANY FAMILY MEMBER BEEN EXPOSED TO ANYONE WITH COVID 19? NO

## 2019-05-07 NOTE — Patient Instructions (Addendum)
DUE TO COVID-19 ONLY ONE VISITOR IS ALLOWED TO COME WITH YOU AND STAY IN THE WAITING ROOM ONLY DURING PRE OP AND PROCEDURE. THE ONE VISITOR MAY VISIT WITH YOU IN YOUR PRIVATE ROOM DURING VISITING HOURS ONLY!!   COVID SWAB TESTING MUST BE COMPLETED ON:   MORNING OF SURGERY AT [facility]         Your procedure is scheduled on: Monday, NOV. 9, 2020   Report to [facility] Main  Entrance    Report to admitting at 11:45 AM   [place]   Call this number if you have problems the morning of surgery [phone]   Do not eat food: After Midnight.   MAY HAVE LIQUIDS UNTIL 8:45 AM DAY OF SURGERY   CLEAR LIQUID DIET  Foods Allowed                                                                     Foods Excluded  Water, Black Coffee and tea, regular and decaf                             liquids that you cannot  Plain Jell-O in any flavor  (No red)                                           see through such as: Fruit ices (not with fruit pulp)                                     milk, soups, orange juice  Iced Popsicles (No red)                                    All solid food Carbonated beverages, regular and diet                                    Apple juices Sports drinks like Gatorade (No red) Lightly seasoned clear broth or consume(fat free) Sugar, honey syrup  Sample Menu Breakfast                                Lunch                                     Supper Cranberry juice                    Beef broth                            Chicken broth Jell-O  Grape juice                           Apple juice Coffee or tea                        Jell-O                                      Popsicle                                                Coffee or tea                        Coffee or tea   Brush your teeth the morning of surgery.   Do NOT smoke after Midnight   Take these medicines the  morning of surgery with A SIP OF WATER:  GABAPENTIN, DULOXETINE, PREDNISONE, METHIMAZOLE TECFIDERA    MAY USE EYEDROPS AND NEBULIZER IF NEEDED DAY OF SURGERY    THE NIGHT BEFORE SURGERY TAKE 1/2 (HALF) LANTUS DOSE OF INSULIN  DO NOT TAKE ANY DIABETIC MEDICATIONS DAY OF YOUR SURGERY                               You may not have any metal on your body including hair pins, jewelry, and body piercings             Do not wear make-up, lotions, powders, perfumes/cologne, or deodorant             Do not wear nail polish.  Do not shave  48 hours prior to surgery.                Do not bring valuables to the hospital. [place].   Contacts, dentures or bridgework may not be worn into surgery.    Patients discharged the day of surgery will not be allowed to drive home.   Special Instructions: Bring a copy of your healthcare power of attorney and living will documents         the day of surgery if you haven't scanned them in before.              Please read over the following fact sheets you were given:  How to Manage Your Diabetes Before and After Surgery  Why is it important to control my blood sugar before and after surgery? . Improving blood sugar levels before and after surgery helps healing and can limit problems. . A way of improving blood sugar control is eating a healthy diet by: o  Eating less sugar and carbohydrates o  Increasing activity/exercise o  Talking with your doctor about reaching your blood sugar goals . High blood sugars (greater than 180 mg/dL) can raise your risk of infections and slow your recovery, so you will need to focus on controlling your diabetes during the weeks before surgery. . Make sure that the doctor who takes care of your diabetes knows about your planned surgery including the date and  location.  How do I manage my blood sugar before surgery? . Check your blood sugar at least 4 times a day, starting 2  days before surgery, to make sure that the level is not too high or low. o Check your blood sugar the morning of your surgery when you wake up and every 2 hours until you get to the Short Stay unit. . If your blood sugar is less than 70 mg/dL, you will need to treat for low blood sugar: o Do not take insulin. o Treat a low blood sugar (less than 70 mg/dL) with  cup of clear juice (cranberry or apple), 4 glucose tablets, OR glucose gel. o Recheck blood sugar in 15 minutes after treatment (to make sure it is greater than 70 mg/dL). If your blood sugar is not greater than 70 mg/dL on recheck, call [phone] for further instructions. . Report your blood sugar to the short stay nurse when you get to Short Stay.  . If you are admitted to the hospital after surgery: o Your blood sugar will be checked by the staff and you will probably be given insulin after surgery (instead of oral diabetes medicines) to make sure you have good blood sugar levels. o The goal for blood sugar control after surgery is 80-180 mg/dL.   WHAT DO I DO ABOUT MY DIABETES MEDICATION?  Marland Kitchen Do not take oral diabetes medicines (pills) the morning of surgery.  . THE NIGHT BEFORE SURGERY, take 16    units of  LANTUS     insulin.      . If your CBG is greater than 220 mg/dL, you may take  of your sliding scale  . (correction) dose of insulin.    For patients with insulin pumps: Contact your diabetes doctor for specific instructions before surgery. Decrease basal rates by 20% at midnight the night before your surgery. Note that if your surgery is planned to be longer than 2 hours, your insulin pump will be removed and intravenous (IV) insulin will be started and managed by the nurses and the anesthesiologist. You will be able to restart your insulin pump once you are awake and able to manage it.  Make sure to bring insulin pump supplies to the hospital with you in case the  site needs to be changed.  Patient  Signature:  Date:   Nurse Signature:  Date:   Reviewed and Endorsed by [facility] Patient Education Committee, August 2015

## 2019-05-07 NOTE — Progress Notes (Signed)
[name] at [facility] left voicemail to confirm they received the faxed pre op instructions for Laura Roberts.

## 2019-05-10 ENCOUNTER — Ambulatory Visit (HOSPITAL_COMMUNITY): Payer: Medicare Other | Admitting: Anesthesiology

## 2019-05-10 ENCOUNTER — Encounter (HOSPITAL_COMMUNITY)
Admission: RE | Disposition: A | Discharge status: Skilled Nursing Facility | Payer: Self-pay | Source: Home / Self Care | Attending: Urology

## 2019-05-10 ENCOUNTER — Encounter (HOSPITAL_COMMUNITY): Payer: Self-pay

## 2019-05-10 ENCOUNTER — Ambulatory Visit (HOSPITAL_COMMUNITY)
Admission: RE | Admit: 2019-05-10 | Discharge: 2019-05-10 | Disposition: A | Discharge status: Skilled Nursing Facility | Payer: Medicare Other | Attending: Urology | Admitting: Urology

## 2019-05-10 DIAGNOSIS — E559 Vitamin D deficiency, unspecified: Secondary | ICD-10-CM | POA: Insufficient documentation

## 2019-05-10 DIAGNOSIS — E1142 Type 2 diabetes mellitus with diabetic polyneuropathy: Secondary | ICD-10-CM | POA: Insufficient documentation

## 2019-05-10 DIAGNOSIS — E0590 Thyrotoxicosis, unspecified without thyrotoxic crisis or storm: Secondary | ICD-10-CM | POA: Insufficient documentation

## 2019-05-10 DIAGNOSIS — Z79899 Other long term (current) drug therapy: Secondary | ICD-10-CM | POA: Insufficient documentation

## 2019-05-10 DIAGNOSIS — Z20828 Contact with and (suspected) exposure to other viral communicable diseases: Secondary | ICD-10-CM | POA: Insufficient documentation

## 2019-05-10 DIAGNOSIS — Z96653 Presence of artificial knee joint, bilateral: Secondary | ICD-10-CM | POA: Diagnosis not present

## 2019-05-10 DIAGNOSIS — Z794 Long term (current) use of insulin: Secondary | ICD-10-CM | POA: Insufficient documentation

## 2019-05-10 DIAGNOSIS — R4701 Aphasia: Secondary | ICD-10-CM | POA: Insufficient documentation

## 2019-05-10 DIAGNOSIS — N319 Neuromuscular dysfunction of bladder, unspecified: Secondary | ICD-10-CM | POA: Diagnosis not present

## 2019-05-10 DIAGNOSIS — Z6839 Body mass index (BMI) 39.0-39.9, adult: Secondary | ICD-10-CM | POA: Diagnosis not present

## 2019-05-10 DIAGNOSIS — F329 Major depressive disorder, single episode, unspecified: Secondary | ICD-10-CM | POA: Insufficient documentation

## 2019-05-10 DIAGNOSIS — I10 Essential (primary) hypertension: Secondary | ICD-10-CM | POA: Insufficient documentation

## 2019-05-10 DIAGNOSIS — R32 Unspecified urinary incontinence: Secondary | ICD-10-CM | POA: Diagnosis not present

## 2019-05-10 DIAGNOSIS — Z87440 Personal history of urinary (tract) infections: Secondary | ICD-10-CM | POA: Insufficient documentation

## 2019-05-10 DIAGNOSIS — G8220 Paraplegia, unspecified: Secondary | ICD-10-CM | POA: Diagnosis not present

## 2019-05-10 DIAGNOSIS — K5900 Constipation, unspecified: Secondary | ICD-10-CM | POA: Diagnosis not present

## 2019-05-10 DIAGNOSIS — G35 Multiple sclerosis: Secondary | ICD-10-CM | POA: Insufficient documentation

## 2019-05-10 DIAGNOSIS — K219 Gastro-esophageal reflux disease without esophagitis: Secondary | ICD-10-CM | POA: Diagnosis not present

## 2019-05-10 DIAGNOSIS — Z7952 Long term (current) use of systemic steroids: Secondary | ICD-10-CM | POA: Insufficient documentation

## 2019-05-10 HISTORY — DX: Thyrotoxicosis, unspecified without thyrotoxic crisis or storm: E05.90

## 2019-05-10 HISTORY — PX: BOTOX INJECTION: SHX5754

## 2019-05-10 LAB — CBC
HCT: 41.3 % (ref 36.0–46.0)
Hemoglobin: 13 g/dL (ref 12.0–15.0)
MCH: 28.9 pg (ref 26.0–34.0)
MCHC: 31.5 g/dL (ref 30.0–36.0)
MCV: 91.8 fL (ref 80.0–100.0)
Platelets: 327 10*3/uL (ref 150–400)
RBC: 4.5 MIL/uL (ref 3.87–5.11)
RDW: 16 % — ABNORMAL HIGH (ref 11.5–15.5)
WBC: 12.5 10*3/uL — ABNORMAL HIGH (ref 4.0–10.5)
nRBC: 0 % (ref 0.0–0.2)

## 2019-05-10 LAB — BASIC METABOLIC PANEL
Anion gap: 8 (ref 5–15)
BUN: 11 mg/dL (ref 8–23)
CO2: 37 mmol/L — ABNORMAL HIGH (ref 22–32)
Calcium: 9.1 mg/dL (ref 8.9–10.3)
Chloride: 95 mmol/L — ABNORMAL LOW (ref 98–111)
Creatinine, Ser: 0.62 mg/dL (ref 0.44–1.00)
GFR calc Af Amer: >60 mL/min (ref >60)
GFR calc non Af Amer: >60 mL/min (ref >60)
Glucose, Bld: 151 mg/dL — ABNORMAL HIGH (ref 70–99)
Potassium: 3.3 mmol/L — ABNORMAL LOW (ref 3.5–5.1)
Sodium: 140 mmol/L (ref 135–145)

## 2019-05-10 LAB — GLUCOSE, CAPILLARY
Glucose-Capillary: 129 mg/dL — ABNORMAL HIGH (ref 70–99)
Glucose-Capillary: 142 mg/dL — ABNORMAL HIGH (ref 70–99)

## 2019-05-10 LAB — SARS CORONAVIRUS 2 BY RT PCR (HOSPITAL ORDER, PERFORMED IN ~~LOC~~ HOSPITAL LAB): SARS Coronavirus 2: NEGATIVE

## 2019-05-10 SURGERY — BOTOX INJECTION
Anesthesia: General

## 2019-05-10 MED ORDER — PROPOFOL 10 MG/ML IV BOLUS
INTRAVENOUS | Status: DC | PRN
  Administered 2019-05-10: 150 mg via INTRAVENOUS

## 2019-05-10 MED ORDER — STERILE WATER FOR IRRIGATION IR SOLN
Status: DC | PRN
  Administered 2019-05-10: 3000 mL via INTRAVESICAL

## 2019-05-10 MED ORDER — ONDANSETRON HCL 4 MG/2ML IJ SOLN
INTRAMUSCULAR | Status: DC | PRN
  Administered 2019-05-10: 4 mg via INTRAVENOUS

## 2019-05-10 MED ORDER — ONABOTULINUMTOXINA 100 UNITS IJ SOLR
INTRAMUSCULAR | Status: AC
  Filled 2019-05-10: qty 200

## 2019-05-10 MED ORDER — CEFAZOLIN SODIUM-DEXTROSE 2-4 GM/100ML-% IV SOLN
2.0000 g | INTRAVENOUS | Status: AC
  Administered 2019-05-10: 2 g via INTRAVENOUS
  Filled 2019-05-10: qty 100

## 2019-05-10 MED ORDER — SODIUM CHLORIDE (PF) 0.9 % IJ SOLN
INTRAMUSCULAR | Status: AC
  Filled 2019-05-10: qty 10

## 2019-05-10 MED ORDER — FENTANYL CITRATE (PF) 100 MCG/2ML IJ SOLN
INTRAMUSCULAR | Status: AC
  Filled 2019-05-10: qty 2

## 2019-05-10 MED ORDER — FENTANYL CITRATE (PF) 100 MCG/2ML IJ SOLN
INTRAMUSCULAR | Status: DC | PRN
  Administered 2019-05-10: 25 ug via INTRAVENOUS

## 2019-05-10 MED ORDER — LIDOCAINE 2% (20 MG/ML) 5 ML SYRINGE
INTRAMUSCULAR | Status: DC | PRN
  Administered 2019-05-10: 50 mg via INTRAVENOUS

## 2019-05-10 MED ORDER — DEXAMETHASONE SODIUM PHOSPHATE 10 MG/ML IJ SOLN
INTRAMUSCULAR | Status: DC | PRN
  Administered 2019-05-10: 10 mg via INTRAVENOUS

## 2019-05-10 MED ORDER — ACETAMINOPHEN 500 MG PO TABS: 1000.0000 mg | ORAL_TABLET | Freq: Once | ORAL | Status: DC

## 2019-05-10 MED ORDER — FENTANYL CITRATE (PF) 100 MCG/2ML IJ SOLN: 25.0000 ug | INTRAMUSCULAR | Status: DC | PRN

## 2019-05-10 MED ORDER — ONABOTULINUMTOXINA 100 UNITS IJ SOLR
INTRAMUSCULAR | Status: DC | PRN
  Administered 2019-05-10: 200 [IU] via INTRAMUSCULAR

## 2019-05-10 MED ORDER — PROPOFOL 10 MG/ML IV BOLUS
INTRAVENOUS | Status: AC
  Filled 2019-05-10: qty 20

## 2019-05-10 MED ORDER — SODIUM CHLORIDE (PF) 0.9 % IJ SOLN
INTRAMUSCULAR | Status: DC | PRN
  Administered 2019-05-10: 20 mL

## 2019-05-10 MED ORDER — LACTATED RINGERS IV SOLN
INTRAVENOUS | Status: DC
  Administered 2019-05-10: 13:00:00 via INTRAVENOUS

## 2019-05-10 SURGICAL SUPPLY — 16 items
BAG URO CATCHER STRL LF (MISCELLANEOUS) ×2 IMPLANT
CATH FOLEY 2WAY SLVR  5CC 16FR (CATHETERS) ×1
CATH FOLEY 2WAY SLVR 5CC 16FR (CATHETERS) IMPLANT
CLOTH BEACON ORANGE TIMEOUT ST (SAFETY) ×2 IMPLANT
GLOVE BIO SURGEON STRL SZ7.5 (GLOVE) ×2 IMPLANT
GOWN STRL REUS W/TWL XL LVL3 (GOWN DISPOSABLE) ×4 IMPLANT
KIT TURNOVER KIT A (KITS) IMPLANT
MANIFOLD NEPTUNE II (INSTRUMENTS) ×2 IMPLANT
NDL ASPIRATION 22 (NEEDLE) ×1 IMPLANT
NDL SAFETY ECLIPSE 18X1.5 (NEEDLE) IMPLANT
NEEDLE ASPIRATION 22 (NEEDLE) ×2 IMPLANT
NEEDLE HYPO 18GX1.5 SHARP (NEEDLE) ×2
PACK CYSTO (CUSTOM PROCEDURE TRAY) ×2 IMPLANT
SYR CONTROL 10ML LL (SYRINGE) ×2 IMPLANT
TUBING CONNECTING 10 (TUBING) ×1 IMPLANT
WATER STERILE IRR 3000ML UROMA (IV SOLUTION) ×2 IMPLANT

## 2019-05-10 NOTE — Transfer of Care (Signed)
Immediate Anesthesia Transfer of Care Note  Patient: Laura Roberts  Procedure(s) Performed: Procedure(s): CYSTOSCOPY BOTOX INJECTION, SUPRAPUBIC EXCHANGE (N/A)  Patient Location: PACU  Anesthesia Type:General  Level of Consciousness:  sedated, patient cooperative and responds to stimulation  Airway & Oxygen Therapy:Patient Spontanous Breathing and Patient connected to face mask oxgen  Post-op Assessment:  Report given to PACU RN and Post -op Vital signs reviewed and stable  Post vital signs:  Reviewed and stable  Last Vitals:  Vitals:   05/10/19 1206  BP: (!) 145/91  Pulse: 92  Resp: 20  SpO2: [id number]    Complications: No apparent anesthesia complications

## 2019-05-10 NOTE — Progress Notes (Signed)
Updated [facility] (patient's residence) and left message for patient's son Leane Para) about surgery delay.

## 2019-05-10 NOTE — Discharge Instructions (Addendum)
You may notice some blood in the urine.  This is normal.  Your suprapubic tube was changed in the operating room.  It should be changed in 1 month.

## 2019-05-10 NOTE — Anesthesia Procedure Notes (Signed)
Procedure Name: LMA Insertion Date/Time: 05/10/2019 5:45 PM Performed by: Anne Fu, CRNA Pre-anesthesia Checklist: Patient identified, Emergency Drugs available, Suction available, Patient being monitored and Timeout performed Patient Re-evaluated:Patient Re-evaluated prior to induction Oxygen Delivery Method: Circle system utilized Preoxygenation: Pre-oxygenation with 100% oxygen Induction Type: IV induction Ventilation: Mask ventilation without difficulty LMA: LMA inserted LMA Size: 4.0 Number of attempts: 1 Placement Confirmation: positive ETCO2 and breath sounds checked- equal and bilateral Tube secured with: Tape

## 2019-05-10 NOTE — Anesthesia Preprocedure Evaluation (Addendum)
Anesthesia Evaluation  Patient identified by MRN, date of birth, ID band Patient awake    Reviewed: Allergy & Precautions, H&P , NPO status , Patient's Chart, lab work & pertinent test results  Airway Mallampati: III  TM Distance: >3 FB Neck ROM: Full    Dental no notable dental hx. (+) Teeth Intact, Dental Advisory Given   Pulmonary neg pulmonary ROS,    Pulmonary exam normal breath sounds clear to auscultation       Cardiovascular hypertension, Pt. on medications  Rhythm:Regular Rate:Normal     Neuro/Psych Depression  Neuromuscular disease    GI/Hepatic Neg liver ROS, GERD  Medicated and Controlled,  Endo/Other  diabetes, Insulin DependentHyperthyroidism Morbid obesity  Renal/GU negative Renal ROS  negative genitourinary   Musculoskeletal  (+) Arthritis , Osteoarthritis,    Abdominal   Peds  Hematology negative hematology ROS (+)   Anesthesia Other Findings   Reproductive/Obstetrics negative OB ROS                            Anesthesia Physical Anesthesia Plan  ASA: III  Anesthesia Plan: General   Post-op Pain Management:    Induction: Intravenous  PONV Risk Score and Plan: 4 or greater and Ondansetron, Treatment may vary due to age or medical condition and Dexamethasone  Airway Management Planned: LMA  Additional Equipment:   Intra-op Plan:   Post-operative Plan: Extubation in OR  Informed Consent: I have reviewed the patients History and Physical, chart, labs and discussed the procedure including the risks, benefits and alternatives for the proposed anesthesia with the patient or authorized representative who has indicated his/her understanding and acceptance.     Dental advisory given  Plan Discussed with: CRNA  Anesthesia Plan Comments:        Anesthesia Quick Evaluation

## 2019-05-10 NOTE — H&P (Signed)
H&P  CC/HPI: cc: Neurogenic bladder  HPI:  10/31/17  A 78 year old female with a history of MS and neurogenic bladder. He recently underwent a suprapubic tube placement on 09/09/2017. She was admitted to the hospital with cellulitis around the suprapubic tube site and was seen by Dr. Junious Silk. She has improved from this. She had a SP tube change on 10/13/2017. This was exchanged for a 14 [place] tube. She presents for follow-up. She was recently hospitalized for a groin abscess that was incised and drained. She is happy now with her suprapubic tube. She has mild leakage around the catheter but not much. Infection has improved.   11/17/2017:  Patient presents today for suprapubic change. She has no other complaints.   12/03/17: Patient presents today with complaints of decreased urinary output from her SP tube site and increased urethral leakage. She states that this has been ongoing for the past week. She is having to wear 2 adult diapers per day, which are typically soaked. She denies any current suprapubic discomfort or abdominal pain. She denies fever or gross hematuria. No nausea or vomiting. She currently uses Detrol for bladder spasms.   12/23/17: She returns today with complaints of increased urinary leakage per her urethra. She remains on Detrol. She was seen in the ED on 6/14 after her SP catheter came out and was not able to be replaced her rehab facility. Per her report, they had a difficult time replaced the catheter and a 12 Fr. SP catheter was eventually placed. Today she states that it intermittently drains well. She states that is draining okay currently, but was not draining well this morning. She notes that when it is not draining, she is having increased leakage per her urethra. She denies fevers or gross hematuria. No suprapubic discomfort.   01/06/2018:  The patient continues with a suprapubic catheter but continues to have significant leakage per urethra. She is interested and entered  detrusor Botox. She has failed oral agents including Detrol.   05/05/2018:  Patient is status post cystoscopy with Botox instillation on 01/30/2018. She initially did well with this. Urine leakage decreased. Of note, during the surgery, she had a very patulous urethra and I was able to insert a finger into the bladder. However, Botox did improve her symptoms of leakage and bladder spasms. Over the past few days, she had an increase in spasms and leakage and the catheter was not draining but after manipulation her catheter has gone back to normal.+   07/06/18: She returns today for follow up. She is s/p Botox instillation as noted above. She presents today with complications regarding SP tube. She states that her SP tube came out late Saturday evening and they were unable to replace this at her SNF. She states that it was draining well up until that point in time. Until this point, she states she has been having some leakage per her urethra intermittently, but generally felt that she had been doing well. Since SP catheter has been out, she has had significant amount of leakage per her urethra since that time and is soaking diapers. No fevers or chills.   07/23/18:  Patient is s/p replacement of SPT, which she underwent on 07/15/18. She c/o bladder spasms. She takes [employer]. SPT has been draining clear, yellow urine.   08/04/2018  Patient had her suprapubic tube replaced on 07/15/2018. She wants it up size. Unfortunately, cannot upsize currently since it has only been 2 weeks and the tract is not mature. She has  not had Botox in about 6 months. She desires this as well.   05/10/2019 Patient last underwent Botox 09/02/2018.  Symptoms have returned and she desires repeat Botox.  She presents for this today.  Past Medical History:  Diagnosis Date  . Age related osteoporosis   . Aphasia   . Constipation   . Cystostomy in place [facility])   . Degenerative arthritis    osteoarthritis   . Depression   . Diabetes  mellitus without complication ([place])    type 2   . Full incontinence of feces   . Gait disturbance   . GERD (gastroesophageal reflux disease)   . History of falling   . Hyperlipidemia   . Hypertension   . Hyperthyroidism   . Multiple sclerosis ([place])   . Neuromuscular disorder (HCC)    Bilateral hand carpal tunnel syndrome  . Neuromuscular dysfunction of bladder   . Nontoxic multinodular goiter   . Obesity   . Other adrenocortical insufficiency ([place])   . Paraplegia (HCC)    LEGS  . Personal history of urinary (tract) infections   . Polyneuropathy   . Pressure ulcer of right buttock, stage 4 ([place])   . Spinal stenosis in cervical region   . Spinal stenosis of lumbosacral region   . Spinal stenosis, thoracic   . Vitamin D deficiency    Past Surgical History:  Procedure Laterality Date  . ABDOMINAL HYSTERECTOMY    . BACK SURGERY    . BOTOX INJECTION N/A 01/30/2018   Procedure: CYSTOSCOPY BOTOX INJECTION, SUPRAPUBIC EXCHANGE;  Surgeon: Lucas Mallow, MD;  Location: [facility];  Service: Urology;  Laterality: N/A;  . BOTOX INJECTION N/A 09/02/2018   Procedure: BOTOX INJECTION;  Surgeon: Lucas Mallow, MD;  Location: [facility];  Service: Urology;  Laterality: N/A;  . CATARACT EXTRACTION Right   . CHOLECYSTECTOMY    . CYSTOSCOPY N/A 09/02/2018   Procedure: CYSTOSCOPY;  Surgeon: Lucas Mallow, MD;  Location: [facility];  Service: Urology;  Laterality: N/A;  . CYSTOSTOMY N/A 09/02/2018   Procedure: CYSTOSTOMY SUPRAPUBIC;  Surgeon: Lucas Mallow, MD;  Location: [facility];  Service: Urology;  Laterality: N/A;  31 MINS  . FEMUR IM NAIL Right 06/02/2014   Procedure: INTRAMEDULLARY (IM) RETROGRADE FEMORAL NAILING;  Surgeon: Johnny Bridge, MD;  Location: [facility];  Service: Orthopedics;  Laterality: Right;  . GROIN DISSECTION Left 10/24/2017   Procedure: IRRIGATION AND DEBRIDEMENT, LEFT THIGH ABCESS ;  Surgeon: Alphonsa Overall, MD;  Location: [facility];  Service: General;  Laterality: Left;  . IR  CATHETER TUBE CHANGE  10/13/2017  . IR CATHETER TUBE CHANGE  07/15/2018  . left hand carpal tunnel surgery    . REPLACEMENT TOTAL KNEE BILATERAL  2004    Home Medications:  Medications Prior to Admission  Medication Sig Dispense Refill Last Dose  . baclofen (LIORESAL) 10 MG tablet Take 10-15 mg by mouth See admin instructions. Take 15 mg in the morning and 10 mg at noon   05/10/2019 at 0800  . baclofen (LIORESAL) 20 MG tablet Take 20 mg by mouth at bedtime.    05/09/2019 at Unknown time  . calcium citrate (CALCITRATE - DOSED IN MG ELEMENTAL CALCIUM) 950 MG tablet Take 200 mg of elemental calcium by mouth daily.    05/10/2019 at 0800  . Carboxymeth-Glycerin-Polysorb (REFRESH OPTIVE ADVANCED) 0.5-1-0.5 % SOLN Apply 1 drop to eye 4 (four) times daily.    05/10/2019 at 0800  . cholecalciferol (VITAMIN D)  1000 UNITS tablet Take 1,000 Units by mouth daily.    05/10/2019 at 0800  . cycloSPORINE (RESTASIS) 0.05 % ophthalmic emulsion Place 1 drop into both eyes 2 (two) times daily.    05/10/2019 at 0800  . DULoxetine (CYMBALTA) 30 MG capsule Take 30 mg by mouth daily. Give along with 60 mg to = 90 mg   05/10/2019 at 0800  . DULoxetine (CYMBALTA) 60 MG capsule Take 60 mg by mouth daily. Give along with 30 mg to = 90 mg   05/10/2019 at 0800  . furosemide (LASIX) 40 MG tablet Take 40 mg by mouth daily.   05/10/2019 at 0800  . gabapentin (NEURONTIN) 400 MG capsule Take 400 mg by mouth 3 (three) times daily.   05/10/2019 at 0800  . guaiFENesin (MUCINEX) 600 MG 12 hr tablet Take 1,200 mg by mouth 2 (two) times daily.    05/10/2019 at 0800  . Insulin Glargine (LANTUS SOLOSTAR) 100 UNIT/ML Solostar Pen Inject 32 Units into the skin at bedtime.    05/09/2019 at 2000  . insulin lispro (HUMALOG) 100 UNIT/ML injection Inject 7-12 Units into the skin See admin instructions. CHECK FSBS BEFORE EACH MEAL IF CBG IS 200-300=7U, 301-400=12U, GREATER THAN 400 CALL MD (PRIME PEN WITH 2 UNITS PRIOR TO EACH USE - PEN EXP   none today  .  linaclotide (LINZESS) 145 MCG CAPS capsule Take 145 mcg by mouth daily before breakfast.    05/10/2019 at 0800  . methimazole (TAPAZOLE) 5 MG tablet Take 1 tablet (5 mg total) by mouth daily. 30 tablet 5 05/10/2019 at 0800  . Multiple Vitamin (MULTIVITAMIN WITH MINERALS) TABS tablet Take 1 tablet by mouth daily.    05/10/2019 at 0800  . pantoprazole (PROTONIX) 40 MG tablet Take 40 mg by mouth daily at 6 (six) AM.    05/10/2019 at 0800  . predniSONE (DELTASONE) 2.5 MG tablet Take 2.5 mg by mouth every other day. Alternating days with 5 mg dose   05/10/2019 at 0800  . predniSONE (DELTASONE) 5 MG tablet Take 5 mg by mouth every other day. Alternating days with 2.5 mg dose   05/09/2019 at Unknown time  . senna-docusate (SENOKOT-S) 8.6-50 MG tablet Take 1 tablet by mouth at bedtime.    05/09/2019 at Unknown time  . solifenacin (VESICARE) 10 MG tablet Take 10 mg by mouth daily.    05/10/2019 at 0800  . spironolactone (ALDACTONE) 50 MG tablet Take 75 mg by mouth daily.    05/10/2019 at 0800  . TECFIDERA 240 MG CPDR Take 1 capsule (240 mg total) by mouth 2 (two) times daily. 60 capsule 11 05/10/2019 at 0800  . acetaminophen (TYLENOL) 325 MG tablet Take 650 mg by mouth every 6 (six) hours as needed for mild pain or fever.    Unknown at Unknown time  . barrier cream (NON-SPECIFIED) CREA Apply 1 application topically 2 (two) times daily. Apply to right ischium and bilateral buttocks   Unknown at Unknown time  . bisacodyl (DULCOLAX) 10 MG suppository Place 10 mg rectally as needed for moderate constipation.   Unknown at Unknown time  . Dextran 70-Hypromellose (GENTEAL TEARS) 0.1-0.3 % SOLN Place 1 drop into both eyes 4 (four) times daily as needed.    Unknown at Unknown time  . ipratropium-albuterol (DUONEB) 0.5-2.5 (3) MG/3ML SOLN Take 3 mLs by nebulization every 6 (six) hours as needed (wheezing/coughing).    Unknown at Unknown time  . magnesium hydroxide (MILK OF MAGNESIA) 400 MG/5ML suspension Take 30 mLs  by mouth  daily as needed for mild constipation.   Unknown at Unknown time  . polyethylene glycol (MIRALAX / GLYCOLAX) packet Take 17 g by mouth daily as needed (constipation.).    Unknown at Unknown time  . promethazine (PHENERGAN) 25 MG tablet Take 25 mg by mouth every 6 (six) hours as needed for nausea or vomiting. Max 3 doses per 24 hours   Unknown at Unknown time  . Skin Protectants, Misc. (MINERIN) CREA Apply 1 application topically 2 (two) times daily.    Unknown at Unknown time  . Sodium Phosphates (RA SALINE ENEMA RE) Place 1 enema rectally daily as needed (constipation).   Unknown at Unknown time   Allergies:  Allergies  Allergen Reactions  . Codeine Other (See Comments)    Difficult breathing and skin problem  . Ultram [Tramadol] Other (See Comments)    Difficult breathing and skin peeling  . Januvia [Sitagliptin] Rash and Other (See Comments)    Blisters    Family History  Problem Relation Age of Onset  . Multiple sclerosis Other        neices.   . Cancer Mother   . Diabetes Mother   . GI Bleed Sister        diverticulitis  . Thyroid disease Neg Hx    Social History:  reports that she has never smoked. She has never used smokeless tobacco. She reports that she does not drink alcohol or use drugs.  ROS: A complete review of systems was performed.  All systems are negative except for pertinent findings as noted. ROS   Physical Exam:  Vital signs in last 24 hours: Pulse Rate:  [92] 92 (11/09 1206) Resp:  [20] 20 (11/09 1206) BP: (145)/(91) 145/91 (11/09 1206) SpO2:  [95 %] 95 % (11/09 1206) Weight:  [97.5 kg] 97.5 kg (11/09 1206) General:  Alert and oriented, No acute distress HEENT: Normocephalic, atraumatic Neck: No JVD or lymphadenopathy Cardiovascular: Regular rate and rhythm Lungs: Regular rate and effort Abdomen: Soft, nontender, nondistended, no abdominal masses Back: No CVA tenderness Extremities: No edema Neurologic: Grossly intact  Laboratory Data:   Results for orders placed or performed during the hospital encounter of 05/10/19 (from the past 24 hour(s))  Glucose, capillary     Status: Abnormal   Collection Time: 05/10/19 12:41 PM  Result Value Ref Range   Glucose-Capillary 129 (H) 70 - 99 mg/dL   Comment 1 Notify RN    Comment 2 Document in Chart   Basic metabolic panel     Status: Abnormal   Collection Time: 05/10/19 12:54 PM  Result Value Ref Range   Sodium 140 135 - 145 mmol/L   Potassium 3.3 (L) 3.5 - 5.1 mmol/L   Chloride 95 (L) 98 - 111 mmol/L   CO2 37 (H) 22 - 32 mmol/L   Glucose, Bld 151 (H) 70 - 99 mg/dL   BUN 11 8 - 23 mg/dL   Creatinine, Ser 0.62 0.44 - 1.00 mg/dL   Calcium 9.1 8.9 - 10.3 mg/dL   GFR calc non Af Amer >60 >60 mL/min   GFR calc Af Amer >60 >60 mL/min   Anion gap 8 5 - 15   No results found for this or any previous visit (from the past 240 hour(s)). Creatinine: Recent Labs    05/10/19 1254  CREATININE 0.62    Impression/Assessment:  Neurogenic bladder Urinary incontinence  Plan:  Proceed with bladder Botox.  Marton Redwood, III 05/10/2019, 3:01 PM

## 2019-05-10 NOTE — Progress Notes (Signed)
Called [facility] lab and requested estimated time of COVID result. They stated time was 1816. COVID test was ordered as rapid. The tech stated no one in [facility] system was able to process rapid at this time and there was no way to result any faster.  Per [facility], COVID test could be done. Rapid test was repeated. Patient verbalized understanding.

## 2019-05-10 NOTE — Anesthesia Postprocedure Evaluation (Signed)
Anesthesia Post Note  Patient: Laura Roberts  Procedure(s) Performed: CYSTOSCOPY BOTOX INJECTION, SUPRAPUBIC EXCHANGE (N/A )     Patient location during evaluation: PACU Anesthesia Type: General Level of consciousness: awake and alert Pain management: pain level controlled Vital Signs Assessment: post-procedure vital signs reviewed and stable Respiratory status: spontaneous breathing, nonlabored ventilation and respiratory function stable Cardiovascular status: blood pressure returned to baseline and stable Postop Assessment: no apparent nausea or vomiting Anesthetic complications: no    Last Vitals:  Vitals:   05/10/19 1845 05/10/19 1857  BP: (!) 143/74 (!) 145/89  Pulse: 92 96  Resp: 15 16  Temp: 36.5 C   SpO2: 100% 96%    Last Pain:  Vitals:   05/10/19 1857  PainSc: 0-No pain                 Renee Beale,W. EDMOND

## 2019-05-10 NOTE — Op Note (Signed)
Operative Note  Preoperative diagnosis:  1.Neurogenic bladder with detrusor overactivity  Postoperative diagnosis: 1.Neurogenic bladder with detrusor overactivity  Procedure(s): 1.Cystoscopy with intravesical Botox instillation, 200 units 2. Suprapubic tube replacement   Surgeon:Eugene Gloriann Loan, MD  Assistants:None  Anesthesia:General  Complications:None immediate  IG:[id number]  Specimens: 1.None  Drains/Catheters: 1.16 French suprapubic catheter  Intraoperative findings:Cystoscopy revealed a low capacity bladder. There was significant catheter edema.  But no obvious tumor  Indication:78 year old female with a history of neurogenic bladder and detrusor overactivity with long-standing history of urethral catheter that was switched over to suprapubic tube. She presents for the previously mentioned operation due to continuous leakage from her urethra despite suprapubic tube.  Her leakage improved with Botox injection last time.  She presents to have this done again as her symptoms have returned.  Description of procedure:  The patient was identified and consent was obtained. The patient was taken to the operating room and placed in the supine position. The patient was placed under generalanesthesia. Perioperative antibiotics were administered. The patient was placed in dorsal lithotomy. Patient was prepped and draped in a standard sterile fashion and a timeout was performed.  The 16 French suprapubic catheter was exchanged.  A 21 French rigid cystoscope was advanced into the urethra and into the bladder. Complete cystoscopy was performed with findings noted above. I then systematically injected 200 units of Botox in 20 different locations. I then withdrew the scope. This concluded the operation. Patient tolerated procedure well and was stable postoperatively.  Plan:Patient will return in 3 to 4 months to see how she is doing with the  Botox.

## 2019-05-11 ENCOUNTER — Encounter (HOSPITAL_COMMUNITY): Payer: Self-pay | Admitting: Urology

## 2019-05-14 LAB — URINE CULTURE: Culture: >=100000 — AB

## 2019-06-14 ENCOUNTER — Telehealth: Payer: Self-pay | Admitting: Neurology

## 2019-06-14 NOTE — Telephone Encounter (Signed)
Debra @ [facility] has called to request a Doxy.me vv for pt.  She stated they are not wanting to bring pt's out to appointments if it is not urgent.  Email sent to [email or web address]

## 2019-06-16 ENCOUNTER — Other Ambulatory Visit: Payer: Self-pay

## 2019-06-16 ENCOUNTER — Encounter: Payer: Self-pay | Admitting: Neurology

## 2019-06-16 ENCOUNTER — Telehealth: Payer: Self-pay | Admitting: Neurology

## 2019-06-16 ENCOUNTER — Telehealth (INDEPENDENT_AMBULATORY_CARE_PROVIDER_SITE_OTHER): Payer: Medicare Other | Admitting: Neurology

## 2019-06-16 DIAGNOSIS — G35 Multiple sclerosis: Secondary | ICD-10-CM | POA: Diagnosis not present

## 2019-06-16 NOTE — Progress Notes (Signed)
Virtual Visit via Video Note  I connected with Laura Roberts on 06/16/19 at 10:15 AM EST by a video enabled telemedicine application and verified that I am speaking with the correct person using two identifiers.  Location: Patient: [facility] in her room  Provider: In the office    I discussed the limitations of evaluation and management by telemedicine and the availability of in person appointments. The patient expressed understanding and agreed to proceed.  History of Present Illness: 06/16/2019 SS: Laura Roberts is a 78 year old female with history of multiple sclerosis associated with paraplegia.  She is currently residing at [facility] rehab center.  She remains on Tecfidera.  She has a neurogenic bladder with a suprapubic catheter.  She had MRI of the brain and cervical spine in September 2018, findings are relatively stable compared to 2015.  She operates an Clinical research associate.  She is not ambulatory, she is not able to use her legs.  She remains involved in restorative therapy, 5 days a week.  She recently had Botox injections in her bladder, that has worked well in the past.  She indicates her MS has remained stable. She denies any new numbness or weakness, changes to the bowels or bladder, or changes to vision.  She currently does not have any skin wounds.  She remains on baclofen for spasticity.  She reports she has remained well.  She presents today for evaluation via virtual visit, difficulty getting the virtual visit at the facility.   12/14/2018 SS: Laura Roberts is a 78 year old female with history of multiple sclerosis associated with paraplegia.  She is currently residing at [facility] after a sacral decubitus ulcer in 2018.  She is currently taking Tecfidera twice daily.  She is tolerating the medication well.  She has a neurogenic bladder with a suprapubic catheter.  She had MRI of the brain and cervical spine in September 2018, findings were relatively stable  compared with 2015.  She denies any new problems or concerns.  She reports her decubitus ulcer has healed.  She does operate an Clinical research associate.  She is not able to use her legs.  She is able to use her right and left arms, but her mobility is limited in her left arm due to her rotator cuff.  She denies any new numbness or weakness.  She denies any vision changes.  She reports her appetite is good and she is able to feed herself.  She is involved in restorative therapy, where the staff performs passive range of motion on her extremities.  She does not have any open wounds at current.  She reports some spasticity in her arms and legs, adequately controlled with baclofen.   Observations/Objective: Telephone visit, I called the patient's cell phone, she answered, and we spoke on he cell phone, facility had difficulty getting video with doxy  She is alert and oriented, answers questions appropriately, speech is clear and concise, good historian  Assessment and Plan: 1.  Multiple sclerosis  She has continued to remain stable.  She will remain on Tecfidera.  I will fax orders to [facility] rehab for CBC with differential, CMP while on Tecfidera.  She has a neurogenic bladder with a suprapubic catheter, has recently undergone Botox injections for the bladder.  The last MRI of the brain and cervical spine was done in September 2018, findings are relatively stable compared with 2015.  She is nonambulatory. I suppose we could discuss at next visit, whether we should continue to  follow MRI's.  She will follow-up in 6 months or sooner if needed.  Follow Up Instructions: 6 months   I discussed the assessment and treatment plan with the patient. The patient was provided an opportunity to ask questions and all were answered. The patient agreed with the plan and demonstrated an understanding of the instructions.   The patient was advised to call back or seek an in-person evaluation if the symptoms worsen or if  the condition fails to improve as anticipated.  I provided 15 minutes of non-face-to-face time during this encounter.  Evangeline Dakin, DNP  [facility] [address] [place], [place] [address] [phone]

## 2019-06-16 NOTE — Telephone Encounter (Signed)
Please fax orders for CBC with diff, cmp to [facility]. Also, schedule 6 month revisit.

## 2019-06-16 NOTE — Telephone Encounter (Signed)
[facility] home and scheduled pts 6 month f/u for 12/20/19 @ 12:45 Doxy appt.   Also faxed over lab orders on behalf of NP Butler Denmark. (Please see SS first note).  Facility Number: [phone] Facility Fax: [phone].

## 2019-06-16 NOTE — Progress Notes (Signed)
I have read the note, and I agree with the clinical assessment and plan.  Lilana Blasko K Neddie Steedman   

## 2019-06-17 NOTE — Telephone Encounter (Signed)
Reynada @ [facility] is asking for a call from [facility] re: the order faxed to them yesterday morning, when calling please ask for pt's RN.  Please call

## 2019-06-17 NOTE — Telephone Encounter (Signed)
Attempted to return call regarding the labs order request. Transferred 4-5x.  Pts RN currently on Lunch break.  Another RN made a note and states that they will reach back out.

## 2019-06-18 LAB — HEPATIC FUNCTION PANEL
ALT: 11 (ref 7–35)
AST: 8 — AB (ref 13–35)
Alkaline Phosphatase: 83 (ref 25–125)
Bilirubin, Total: 0.3

## 2019-06-18 LAB — BASIC METABOLIC PANEL
BUN: 10 (ref 4–21)
CO2: 35 — AB (ref 13–22)
Chloride: 93 — AB (ref 99–108)
Creatinine: 0.6 (ref 0.5–1.1)
Glucose: 241
Potassium: 3.3 — AB (ref 3.4–5.3)
Sodium: 139 (ref 137–147)

## 2019-06-18 LAB — COMPREHENSIVE METABOLIC PANEL
Albumin: 3.9 (ref 3.5–5.0)
Calcium: 9 (ref 8.7–10.7)
GFR calc Af Amer: 90
GFR calc non Af Amer: 88.93
Globulin: 2.6

## 2019-06-18 LAB — CBC AND DIFFERENTIAL
HCT: 37 (ref 36–46)
Hemoglobin: 12 (ref 12.0–16.0)
Neutrophils Absolute: 9
Platelets: 285 (ref 150–399)
WBC: 11.8

## 2019-06-18 LAB — CBC: RBC: 4.36 (ref 3.87–5.11)

## 2019-06-21 NOTE — Telephone Encounter (Signed)
Anderson Malta RN called in regards to the patients labs. Please follow up.

## 2019-06-21 NOTE — Telephone Encounter (Signed)
Please advise: Attempted to return a call to the facility. The staff/RN had questions regarding the lab orders.   No answer, transferred 3x no answer. One of the staff wrote down that I had called and was attempting to reach back out.   Please see NP Lenore Manner last note about the lab orders.  Already scheduled the pts 69mon f/u. Labs ordered: CBC w/ Diff, and CMP.

## 2019-07-06 ENCOUNTER — Encounter: Payer: Self-pay | Admitting: Internal Medicine

## 2019-07-06 ENCOUNTER — Non-Acute Institutional Stay (SKILLED_NURSING_FACILITY): Payer: Medicare Other | Admitting: Internal Medicine

## 2019-07-06 DIAGNOSIS — I10 Essential (primary) hypertension: Secondary | ICD-10-CM | POA: Diagnosis not present

## 2019-07-06 DIAGNOSIS — Z794 Long term (current) use of insulin: Secondary | ICD-10-CM

## 2019-07-06 DIAGNOSIS — R195 Other fecal abnormalities: Secondary | ICD-10-CM

## 2019-07-06 DIAGNOSIS — N319 Neuromuscular dysfunction of bladder, unspecified: Secondary | ICD-10-CM

## 2019-07-06 DIAGNOSIS — E1140 Type 2 diabetes mellitus with diabetic neuropathy, unspecified: Secondary | ICD-10-CM | POA: Diagnosis not present

## 2019-07-06 NOTE — Patient Instructions (Signed)
See assessment and plan under each diagnosis in the problem list and acutely for this visit 

## 2019-07-06 NOTE — Progress Notes (Signed)
   NURSING HOME LOCATION:  [facility] ROOM NUMBER:  [id number]  CODE STATUS:   DNR  PCP:  [name], MD  [place] [address]   This is a nursing facility follow up of chronic medical diagnoses.  Interim medical record and care since last [place] visit was updated with review of diagnostic studies and change in clinical status since last visit were documented.  HPI: She is a permanent resident of the facility with diagnoses of vitamin D deficiency, cervical/ thoracic and lumbosacral spinal stenoses, polyneuropathy, paraplegia, adrenocortical insufficiency, neurogenic bladder, hypothyroidism, essential hypertension, dyslipidemia, GERD, and depression.  Her neuromuscular condition with bed or wheelchair bound status has been complicated by pressure ulcers.  Review of systems: The patient was changed to Trulicity approximately 3 weeks ago.  Morning sugars range from 120 up to 180.  Evening glucoses range from 154 up to 261.  Control has improved significantly with this regimen change.  She states that the medicine has caused some nausea.  She denies any diabetic symptoms and there has been no change in her numbness or other neuromuscular symptoms.  She stated she was having watery stools which have now become soft. She is on Linzess.  Constitutional: No fever, significant weight change, fatigue  Eyes: No redness, discharge, pain, vision change ENT/mouth: No nasal congestion,  purulent discharge, earache, change in hearing, sore throat  Cardiovascular: No chest pain, palpitations, paroxysmal nocturnal dyspnea, claudication Respiratory: No cough, sputum production, hemoptysis, DOE, significant snoring, apnea   Gastrointestinal: No heartburn, dysphagia, abdominal pain, vomiting, rectal bleeding, melena Genitourinary: No  hematuria, pyuria Musculoskeletal: No joint stiffness, joint swelling Dermatologic: No rash, pruritus, change in appearance of  skin Neurologic: No dizziness, headache, syncope, seizures Psychiatric: No significant anxiety, depression, insomnia, anorexia Endocrine: No change in hair/skin/nails, excessive thirst, excessive hunger Hematologic/lymphatic: No significant bruising, lymphadenopathy, abnormal bleeding Allergy/immunology: No itchy/watery eyes, significant sneezing, urticaria, angioedema  Physical exam:  Pertinent or positive findings: When seen she was reclining in the power wheelchair receiving PT to the lower extremities.  Arcus senilis is present.  She was wearing a mask during the interview and the oral cavity was not examined as she has no active upper respiratory tract symptoms.  Breath sounds were slightly decreased.  Abdomen is protuberant.  She has nonpitting edema of the lower extremities.  Pedal pulses are not palpable.  She has no movement in the lower extremities. General appearance: Adequately nourished; no acute distress, increased work of breathing is present.   Lymphatic: No lymphadenopathy about the head, neck, axilla. Eyes: No conjunctival inflammation or lid edema is present. There is no scleral icterus. Ears:  External ear exam shows no significant lesions or deformities.   Nose:  External nasal examination shows no deformity or inflammation. Nasal mucosa are pink and moist without lesions, exudates Oral exam:  Lips and gums are healthy appearing. There is no oropharyngeal erythema or exudate. Neck:  No thyromegaly, masses, tenderness noted.    Heart:  Normal rate and regular rhythm. S1 and S2 normal without gallop, murmur, click, rub .  Lungs:  without wheezes, rhonchi, rales, rubs. Abdomen: Bowel sounds are normal. Abdomen is soft and nontender with no organomegaly, hernias, masses. GU: Deferred  Extremities:  No cyanosis, clubbing  Skin: Warm & dry w/o tenting. No significant lesions or rash.  See summary under each active problem in the Problem List with associated updated therapeutic  plan

## 2019-07-06 NOTE — Assessment & Plan Note (Addendum)
Trulicity has resulted in improved glucose control.  A1c can be rechecked after being on this regimen for at least 8 weeks.

## 2019-07-06 NOTE — Assessment & Plan Note (Signed)
BP controlled; no change in antihypertensive medications  

## 2019-07-06 NOTE — Assessment & Plan Note (Signed)
Despite the presence of neurogenic bladder and need for recurrent urologic procedures, creatinine remains normal.

## 2019-07-09 ENCOUNTER — Other Ambulatory Visit: Payer: Self-pay

## 2019-07-09 ENCOUNTER — Non-Acute Institutional Stay: Payer: Medicare Other | Admitting: Hospice

## 2019-07-09 DIAGNOSIS — G35 Multiple sclerosis: Secondary | ICD-10-CM

## 2019-07-09 DIAGNOSIS — Z515 Encounter for palliative care: Secondary | ICD-10-CM

## 2019-07-09 NOTE — Progress Notes (Signed)
[facility] Palliative Care Consult Note Telephone: [phone]  Fax: [phone]  PATIENT NAME: Laura Roberts DOB: [birth year] MRN: [id number]  PRIMARY CARE PROVIDER:   [name], MD  REFERRING PROVIDER:  [name], [place],  [place] [address]  RESPONSIBLE PARTY:   Extended Emergency Contact Information Primary Emergency Contact: Froman,Leroy III [place], [place] Phone: [phone] Relation: Son Secondary Emergency Contact: Maggart,Tyrone [place] Phone: [phone] Mobile Phone: [phone] Relation: Son  TELEHEALTH VISIT STATEMENT Due to the COVID-19 crisis, this visit was done via telephone from my office. It was initiated and consented to by this patient and/or family. Social worker Pension scheme manager facilitated zoom call and provided information  RECOMMENDATIONS/PLAN:   Advance Care Planning/Goals of Care: Telehealth Visit consisted of building trust and follow up on patient's health status and palliative care. Patient is a DNR; DNR form uploaded in [facility]. Goals of care include to maximize quality of life and symptom management. Symptom management: Patient continues with restorative exercises at the facility gym related to [place]; she is fairly stable in her chronic conditions with no acute concerns or uncontrolled pain; no significant weight loss since last assessment. Current weight is 209. Ibs.  Follow up: Palliative care will continue to follow patient for goals of care clarification and symptom management. I spent 20  minutes providing this consultation, from 2pm to 2.30pm. More than 50% of the time in this consultation was spent on coordinating communication HISTORY OF PRESENT ILLNESS:  Laura Roberts is a 79 y.o. year old female with multiple medical problems including MS, paraplegia, depression, h/o fracture, GED, DM2, HTN, neurogenic pain of LE,  suprapubic urinary catheter. Palliative Care was asked to help address goals of care. CODE STATUS: DNR  PPS: 30% HOSPICE ELIGIBILITY/DIAGNOSIS: TBD  PAST MEDICAL HISTORY:  Past Medical History:  Diagnosis Date  . Age related osteoporosis   . Aphasia   . Constipation   . Cystostomy in place [facility])   . Degenerative arthritis    osteoarthritis   . Depression   . Diabetes mellitus without complication ([facility])    type 2   . Full incontinence of feces   . Gait disturbance   . GERD (gastroesophageal reflux disease)   . History of falling   . Hyperlipidemia   . Hypertension   . Hyperthyroidism   . Multiple sclerosis ([place])   . Neuromuscular disorder ([facility])    Bilateral hand carpal tunnel syndrome  . Neuromuscular dysfunction of bladder   . Nontoxic multinodular goiter   . Obesity   . Other adrenocortical insufficiency ([place])   . Paraplegia ([facility])    LEGS  . Personal history of urinary (tract) infections   . Polyneuropathy   . Pressure ulcer of right buttock, stage 4 ([place])   . Spinal stenosis in cervical region   . Spinal stenosis of lumbosacral region   . Spinal stenosis, thoracic   . Vitamin D deficiency     SOCIAL HX:  Social History   Tobacco Use  . Smoking status: Never Smoker  . Smokeless tobacco: Never Used  Substance Use Topics  . Alcohol use: No    ALLERGIES:  Allergies  Allergen Reactions  . Codeine Other (See Comments)    Difficult breathing and skin problem  . Ultram [Tramadol] Other (See Comments)    Difficult breathing and skin peeling  . Januvia [Sitagliptin] Rash and Other (See Comments)    Blisters  PERTINENT MEDICATIONS:  Outpatient Encounter Medications as of 07/09/2019  Medication Sig  . acetaminophen (TYLENOL) 325 MG tablet Take 650 mg by mouth every 6 (six) hours as needed for mild pain or fever.   . baclofen (LIORESAL) 10 MG tablet Take 10-15 mg by mouth See admin instructions. Take 15 mg in the morning and 10 mg at noon  . baclofen  (LIORESAL) 20 MG tablet Take 20 mg by mouth at bedtime.   . barrier cream (NON-SPECIFIED) CREA Apply 1 application topically 2 (two) times daily. Apply to right ischium and bilateral buttocks  . bisacodyl (DULCOLAX) 10 MG suppository Place 10 mg rectally as needed for moderate constipation.  . calcium citrate (CALCITRATE - DOSED IN MG ELEMENTAL CALCIUM) 950 MG tablet Take 200 mg of elemental calcium by mouth daily.   . Carboxymeth-Glycerin-Polysorb (REFRESH OPTIVE ADVANCED) 0.5-1-0.5 % SOLN Apply 1 drop to eye 4 (four) times daily.   . cholecalciferol (VITAMIN D) 1000 UNITS tablet Take 1,000 Units by mouth daily.   . cycloSPORINE (RESTASIS) 0.05 % ophthalmic emulsion Place 1 drop into both eyes 2 (two) times daily.   Marland Kitchen Dextran 70-Hypromellose (GENTEAL TEARS) 0.1-0.3 % SOLN Place 1 drop into both eyes 4 (four) times daily as needed.   . Dulaglutide (TRULICITY) A999333 0000000 SOPN Inject 0.5 mLs into the skin.  . DULoxetine (CYMBALTA) 30 MG capsule Take 30 mg by mouth daily. Give along with 60 mg to = 90 mg  . DULoxetine (CYMBALTA) 60 MG capsule Take 60 mg by mouth daily. Give along with 30 mg to = 90 mg  . furosemide (LASIX) 40 MG tablet Take 40 mg by mouth daily.  Marland Kitchen gabapentin (NEURONTIN) 400 MG capsule Take 400 mg by mouth 3 (three) times daily.  Marland Kitchen guaiFENesin (MUCINEX) 600 MG 12 hr tablet Take 1,200 mg by mouth 2 (two) times daily.   . Insulin Glargine (LANTUS SOLOSTAR) 100 UNIT/ML Solostar Pen Inject 34 Units into the skin at bedtime.   . insulin lispro (HUMALOG) 100 UNIT/ML injection Inject 7-12 Units into the skin See admin instructions. CHECK FSBS BEFORE EACH MEAL IF CBG IS 200-300=7U, 301-400=12U, GREATER THAN 400 CALL MD (PRIME PEN WITH 2 UNITS PRIOR TO EACH USE - PEN EXP  . ipratropium-albuterol (DUONEB) 0.5-2.5 (3) MG/3ML SOLN Take 3 mLs by nebulization every 6 (six) hours as needed (wheezing/coughing).   Marland Kitchen linaclotide (LINZESS) 145 MCG CAPS capsule Take 145 mcg by mouth daily before  breakfast.   . magnesium hydroxide (MILK OF MAGNESIA) 400 MG/5ML suspension Take 30 mLs by mouth daily as needed for mild constipation.  . methimazole (TAPAZOLE) 5 MG tablet Take 1 tablet (5 mg total) by mouth daily.  . Multiple Vitamin (MULTIVITAMIN WITH MINERALS) TABS tablet Take 1 tablet by mouth daily.   . ondansetron (ZOFRAN) 4 MG tablet Take 4 mg by mouth every 6 (six) hours as needed for nausea or vomiting.  . pantoprazole (PROTONIX) 40 MG tablet Take 40 mg by mouth daily at 6 (six) AM.   . polyethylene glycol (MIRALAX / GLYCOLAX) packet Take 17 g by mouth daily as needed (constipation.).   Marland Kitchen predniSONE (DELTASONE) 2.5 MG tablet Take 2.5 mg by mouth every other day. Alternating days with 5 mg dose  . predniSONE (DELTASONE) 5 MG tablet Take 5 mg by mouth every other day. Alternating days with 2.5 mg dose  . promethazine (PHENERGAN) 25 MG tablet Take 25 mg by mouth every 6 (six) hours as needed for nausea or vomiting. Max 3 doses per  24 hours  . senna-docusate (SENOKOT-S) 8.6-50 MG tablet Take 1 tablet by mouth at bedtime.   . Skin Protectants, Misc. (MINERIN) CREA Apply 1 application topically 2 (two) times daily.   . Sodium Phosphates (RA SALINE ENEMA RE) Place 1 enema rectally daily as needed (constipation).  . solifenacin (VESICARE) 10 MG tablet Take 10 mg by mouth daily.   Marland Kitchen spironolactone (ALDACTONE) 50 MG tablet Take 75 mg by mouth daily.   . TECFIDERA 240 MG CPDR Take 1 capsule (240 mg total) by mouth 2 (two) times daily.   Facility-Administered Encounter Medications as of 07/09/2019  Medication  . botulinum toxin Type A (BOTOX) injection 200 Units    Teodoro Spray, NP

## 2019-07-30 LAB — COMPREHENSIVE METABOLIC PANEL
Calcium: 9.5 (ref 8.7–10.7)
GFR calc Af Amer: 90
GFR calc non Af Amer: 86.96

## 2019-07-30 LAB — BASIC METABOLIC PANEL
BUN: 13 (ref 4–21)
CO2: 35 — AB (ref 13–22)
Chloride: 96 — AB (ref 99–108)
Creatinine: 0.6 (ref 0.5–1.1)
Glucose: 133
Potassium: 3.4 (ref 3.4–5.3)
Sodium: 143 (ref 137–147)

## 2019-07-30 LAB — LIPID PANEL
Cholesterol: 156 (ref 0–200)
HDL: 56 (ref 35–70)
LDL Cholesterol: 85
LDl/HDL Ratio: 2.8
Triglycerides: 77 (ref 40–160)

## 2019-07-30 LAB — TSH: TSH: 1.1 (ref 0.41–5.90)

## 2019-07-30 LAB — HEMOGLOBIN A1C: Hemoglobin A1C: 7.1

## 2019-08-12 ENCOUNTER — Other Ambulatory Visit: Payer: Self-pay | Admitting: Urology

## 2019-08-13 ENCOUNTER — Other Ambulatory Visit: Payer: Self-pay | Admitting: Urology

## 2019-08-24 ENCOUNTER — Other Ambulatory Visit (HOSPITAL_COMMUNITY): Payer: Self-pay | Admitting: Internal Medicine

## 2019-08-24 ENCOUNTER — Encounter: Payer: Self-pay | Admitting: Internal Medicine

## 2019-08-24 DIAGNOSIS — T83010A Breakdown (mechanical) of cystostomy catheter, initial encounter: Secondary | ICD-10-CM

## 2019-08-24 DIAGNOSIS — Z430 Encounter for attention to tracheostomy: Secondary | ICD-10-CM

## 2019-08-26 ENCOUNTER — Other Ambulatory Visit: Payer: Self-pay

## 2019-08-26 ENCOUNTER — Ambulatory Visit (HOSPITAL_COMMUNITY)
Admission: RE | Admit: 2019-08-26 | Discharge: 2019-08-26 | Disposition: A | Discharge status: Home / Self Care | Payer: Medicare Other | Source: Ambulatory Visit | Attending: Internal Medicine | Admitting: Internal Medicine

## 2019-08-26 ENCOUNTER — Other Ambulatory Visit (HOSPITAL_COMMUNITY): Payer: Self-pay | Admitting: Internal Medicine

## 2019-08-26 DIAGNOSIS — Z435 Encounter for attention to cystostomy: Secondary | ICD-10-CM | POA: Diagnosis not present

## 2019-08-26 DIAGNOSIS — Z9359 Other cystostomy status: Secondary | ICD-10-CM

## 2019-08-26 DIAGNOSIS — T83010A Breakdown (mechanical) of cystostomy catheter, initial encounter: Secondary | ICD-10-CM

## 2019-08-26 HISTORY — PX: IR CATHETER TUBE CHANGE: IMG717

## 2019-08-26 MED ORDER — IOHEXOL 300 MG/ML  SOLN
50.0000 mL | Freq: Once | INTRAMUSCULAR | Status: AC | PRN
  Administered 2019-08-26: 20 mL

## 2019-08-26 MED ORDER — LIDOCAINE VISCOUS HCL 2 % MT SOLN
OROMUCOSAL | Status: AC
  Filled 2019-08-26: qty 15

## 2019-08-26 NOTE — Procedures (Signed)
Interventional Radiology Procedure Note  History:79 yo female with suprapubic catheter, displaced Saturday.  Previous was 41F.  Procedure: Exchange with 21F pigtail. Will need re-upsize.    Complications: None  Recommendations:  - To gravity drain - 2 weeks will need appointment for upsize again, with sedation - DC when goals met  Signed,  Dulcy Fanny. Earleen Newport, DO

## 2019-08-31 ENCOUNTER — Encounter (HOSPITAL_COMMUNITY): Payer: Self-pay | Admitting: Urology

## 2019-08-31 ENCOUNTER — Other Ambulatory Visit: Payer: Self-pay | Admitting: Urology

## 2019-08-31 NOTE — Progress Notes (Addendum)
Preop instructions for:    Laura Roberts                      Date of Birth  1941/02/20                            Date of Procedure: 09/06/2019       Doctor: Link Snuffer  Time to arrive at [facility]  Report to: Admitting?Short Stay   Procedure:Cystoscopy    Do not eat or drink past midnight the night before your procedure.(To include any tube feedings-must be discontinued)    Take these morning medications only with sips of water.(or give through gastrostomy or feeding tube). Nebulizer if needed, Eye drops is needed, Pantoprazole, Methimazole, Baclofen, Gabapentin , Tecfidera  Take 1/2 of evening dose of Lantus Insulin nite before surgery.  Note: No Insulin or Diabetic meds should be given or taken the morning of the procedure!   Facility contact:    [place]             Phone:     [place]:  Transportation contact phone#:  Please send day of procedure:current med list and meds last taken that day, confirm nothing by mouth status from what time, Patient Demographic info( to include DNR status, problem list, allergies)   RN contact name/phone#:   Short Stay [facility]           [phone]                 and Fax #:  [place] card and picture ID Leave all jewelry and other valuables at place where living( no metal or rings to be worn) No contact lens Women-no make-up, no lotions,perfumes,powders   Any questions day of procedure,call  SHORT STAY-[phone]   Sent from :[facility]                   [place]                   Fax:[phone]  Sent by : Gillian Shields RN

## 2019-09-03 NOTE — Progress Notes (Signed)
Spoke with nurse at [facility],  Has not yet received proep instructioins per nurse that were faxed on 08/31/2019.  Nurse game another fax number and proep instructions faxed to that number.  Nurse to call back if she does not received preop instructions.

## 2019-09-06 ENCOUNTER — Ambulatory Visit (HOSPITAL_COMMUNITY)
Admission: RE | Admit: 2019-09-06 | Discharge: 2019-09-06 | Disposition: A | Discharge status: Home / Self Care | Payer: Medicare Other | Attending: Urology | Admitting: Urology

## 2019-09-06 ENCOUNTER — Encounter (HOSPITAL_COMMUNITY): Payer: Self-pay | Admitting: Urology

## 2019-09-06 ENCOUNTER — Encounter (HOSPITAL_COMMUNITY)
Admission: RE | Disposition: A | Discharge status: Home / Self Care | Payer: Self-pay | Source: Home / Self Care | Attending: Urology

## 2019-09-06 ENCOUNTER — Ambulatory Visit (HOSPITAL_COMMUNITY): Payer: Medicare Other | Admitting: Anesthesiology

## 2019-09-06 DIAGNOSIS — F329 Major depressive disorder, single episode, unspecified: Secondary | ICD-10-CM | POA: Insufficient documentation

## 2019-09-06 DIAGNOSIS — Z20822 Contact with and (suspected) exposure to covid-19: Secondary | ICD-10-CM | POA: Insufficient documentation

## 2019-09-06 DIAGNOSIS — M1990 Unspecified osteoarthritis, unspecified site: Secondary | ICD-10-CM | POA: Diagnosis not present

## 2019-09-06 DIAGNOSIS — G35 Multiple sclerosis: Secondary | ICD-10-CM | POA: Insufficient documentation

## 2019-09-06 DIAGNOSIS — I10 Essential (primary) hypertension: Secondary | ICD-10-CM | POA: Diagnosis not present

## 2019-09-06 DIAGNOSIS — Z888 Allergy status to other drugs, medicaments and biological substances status: Secondary | ICD-10-CM | POA: Diagnosis not present

## 2019-09-06 DIAGNOSIS — Z7952 Long term (current) use of systemic steroids: Secondary | ICD-10-CM | POA: Diagnosis not present

## 2019-09-06 DIAGNOSIS — K219 Gastro-esophageal reflux disease without esophagitis: Secondary | ICD-10-CM | POA: Diagnosis not present

## 2019-09-06 DIAGNOSIS — E119 Type 2 diabetes mellitus without complications: Secondary | ICD-10-CM | POA: Insufficient documentation

## 2019-09-06 DIAGNOSIS — Z6837 Body mass index (BMI) 37.0-37.9, adult: Secondary | ICD-10-CM | POA: Insufficient documentation

## 2019-09-06 DIAGNOSIS — Z79899 Other long term (current) drug therapy: Secondary | ICD-10-CM | POA: Diagnosis not present

## 2019-09-06 DIAGNOSIS — N319 Neuromuscular dysfunction of bladder, unspecified: Secondary | ICD-10-CM | POA: Diagnosis not present

## 2019-09-06 DIAGNOSIS — N311 Reflex neuropathic bladder, not elsewhere classified: Secondary | ICD-10-CM | POA: Insufficient documentation

## 2019-09-06 DIAGNOSIS — Z794 Long term (current) use of insulin: Secondary | ICD-10-CM | POA: Diagnosis not present

## 2019-09-06 DIAGNOSIS — Z885 Allergy status to narcotic agent status: Secondary | ICD-10-CM | POA: Diagnosis not present

## 2019-09-06 HISTORY — DX: Edema, unspecified: R60.9

## 2019-09-06 HISTORY — DX: Gout, unspecified: M10.9

## 2019-09-06 HISTORY — DX: Bronchitis, not specified as acute or chronic: J40

## 2019-09-06 HISTORY — DX: Cutaneous abscess of unspecified foot: L03.039

## 2019-09-06 HISTORY — DX: Cutaneous abscess of unspecified foot: L02.619

## 2019-09-06 HISTORY — DX: Age-related osteoporosis without current pathological fracture: M81.0

## 2019-09-06 HISTORY — PX: INSERTION OF SUPRAPUBIC CATHETER: SHX5870

## 2019-09-06 HISTORY — DX: Tinea unguium: B35.1

## 2019-09-06 HISTORY — PX: BOTOX INJECTION: SHX5754

## 2019-09-06 LAB — BASIC METABOLIC PANEL
Anion gap: 10 (ref 5–15)
BUN: 9 mg/dL (ref 8–23)
CO2: 39 mmol/L — ABNORMAL HIGH (ref 22–32)
Calcium: 9 mg/dL (ref 8.9–10.3)
Chloride: 93 mmol/L — ABNORMAL LOW (ref 98–111)
Creatinine, Ser: 0.54 mg/dL (ref 0.44–1.00)
GFR calc Af Amer: >60 mL/min (ref >60)
GFR calc non Af Amer: >60 mL/min (ref >60)
Glucose, Bld: 104 mg/dL — ABNORMAL HIGH (ref 70–99)
Potassium: 3 mmol/L — ABNORMAL LOW (ref 3.5–5.1)
Sodium: 142 mmol/L (ref 135–145)

## 2019-09-06 LAB — CBC
HCT: 41 % (ref 36.0–46.0)
Hemoglobin: 12.9 g/dL (ref 12.0–15.0)
MCH: 27.4 pg (ref 26.0–34.0)
MCHC: 31.5 g/dL (ref 30.0–36.0)
MCV: 87.2 fL (ref 80.0–100.0)
Platelets: 346 10*3/uL (ref 150–400)
RBC: 4.7 MIL/uL (ref 3.87–5.11)
RDW: 16.9 % — ABNORMAL HIGH (ref 11.5–15.5)
WBC: 11 10*3/uL — ABNORMAL HIGH (ref 4.0–10.5)
nRBC: 0 % (ref 0.0–0.2)

## 2019-09-06 LAB — RESPIRATORY PANEL BY RT PCR (FLU A&B, COVID)
Influenza A by PCR: NEGATIVE
Influenza B by PCR: NEGATIVE
SARS Coronavirus 2 by RT PCR: NEGATIVE

## 2019-09-06 LAB — GLUCOSE, CAPILLARY
Glucose-Capillary: 91 mg/dL (ref 70–99)
Glucose-Capillary: 100 mg/dL — ABNORMAL HIGH (ref 70–99)

## 2019-09-06 SURGERY — BOTOX INJECTION
Anesthesia: General

## 2019-09-06 MED ORDER — ONABOTULINUMTOXINA 100 UNITS IJ SOLR
INTRAMUSCULAR | Status: AC
  Filled 2019-09-06: qty 200

## 2019-09-06 MED ORDER — LACTATED RINGERS IV SOLN: INTRAVENOUS | Status: DC

## 2019-09-06 MED ORDER — DEXAMETHASONE SODIUM PHOSPHATE 10 MG/ML IJ SOLN
INTRAMUSCULAR | Status: AC
  Filled 2019-09-06: qty 1

## 2019-09-06 MED ORDER — LIDOCAINE 2% (20 MG/ML) 5 ML SYRINGE
INTRAMUSCULAR | Status: AC
  Filled 2019-09-06: qty 5

## 2019-09-06 MED ORDER — ONABOTULINUMTOXINA 100 UNITS IJ SOLR
100.0000 [IU] | Freq: Once | INTRAMUSCULAR | Status: AC
  Administered 2019-09-06: 100 [IU] via INTRAMUSCULAR

## 2019-09-06 MED ORDER — LIDOCAINE 2% (20 MG/ML) 5 ML SYRINGE
INTRAMUSCULAR | Status: DC | PRN
  Administered 2019-09-06: 100 mg via INTRAVENOUS

## 2019-09-06 MED ORDER — ONABOTULINUMTOXINA 100 UNITS IJ SOLR: 100.0000 [IU] | Freq: Once | INTRAMUSCULAR | Status: DC

## 2019-09-06 MED ORDER — CEFAZOLIN SODIUM-DEXTROSE 2-4 GM/100ML-% IV SOLN
2.0000 g | INTRAVENOUS | Status: AC
  Administered 2019-09-06: 2 g via INTRAVENOUS
  Filled 2019-09-06: qty 100

## 2019-09-06 MED ORDER — FENTANYL CITRATE (PF) 100 MCG/2ML IJ SOLN
INTRAMUSCULAR | Status: DC | PRN
  Administered 2019-09-06 (×2): 50 ug via INTRAVENOUS

## 2019-09-06 MED ORDER — STERILE WATER FOR IRRIGATION IR SOLN
Status: DC | PRN
  Administered 2019-09-06: 3000 mL

## 2019-09-06 MED ORDER — FENTANYL CITRATE (PF) 100 MCG/2ML IJ SOLN
25.0000 ug | INTRAMUSCULAR | Status: DC | PRN
  Administered 2019-09-06: 50 ug via INTRAVENOUS

## 2019-09-06 MED ORDER — PROPOFOL 500 MG/50ML IV EMUL
INTRAVENOUS | Status: DC | PRN
  Administered 2019-09-06: 150 mg via INTRAVENOUS

## 2019-09-06 MED ORDER — ONABOTULINUMTOXINA 100 UNITS IJ SOLR
INTRAMUSCULAR | Status: DC | PRN
  Administered 2019-09-06: 100 [IU] via INTRAMUSCULAR

## 2019-09-06 MED ORDER — DEXAMETHASONE SODIUM PHOSPHATE 10 MG/ML IJ SOLN
INTRAMUSCULAR | Status: DC | PRN
  Administered 2019-09-06: 10 mg via INTRAVENOUS

## 2019-09-06 MED ORDER — FENTANYL CITRATE (PF) 100 MCG/2ML IJ SOLN
INTRAMUSCULAR | Status: AC
  Filled 2019-09-06: qty 2

## 2019-09-06 MED ORDER — ONDANSETRON HCL 4 MG/2ML IJ SOLN: 4.0000 mg | Freq: Once | INTRAMUSCULAR | Status: DC | PRN

## 2019-09-06 MED ORDER — SODIUM CHLORIDE (PF) 0.9 % IJ SOLN
INTRAMUSCULAR | Status: AC
  Filled 2019-09-06: qty 20

## 2019-09-06 MED ORDER — ONDANSETRON HCL 4 MG/2ML IJ SOLN
INTRAMUSCULAR | Status: AC
  Filled 2019-09-06: qty 2

## 2019-09-06 MED ORDER — MEPERIDINE HCL 50 MG/ML IJ SOLN: 6.2500 mg | INTRAMUSCULAR | Status: DC | PRN

## 2019-09-06 MED ORDER — ONDANSETRON HCL 4 MG/2ML IJ SOLN
INTRAMUSCULAR | Status: DC | PRN
  Administered 2019-09-06: 4 mg via INTRAVENOUS

## 2019-09-06 SURGICAL SUPPLY — 34 items
BAG DRN RND TRDRP ANRFLXCHMBR (UROLOGICAL SUPPLIES) ×1
BAG URINE DRAIN 2000ML AR STRL (UROLOGICAL SUPPLIES) ×2 IMPLANT
BAG URINE LEG 500ML (DRAIN) ×2 IMPLANT
BAG URO CATCHER STRL LF (MISCELLANEOUS) ×2 IMPLANT
BLADE SURG 15 STRL LF DISP TIS (BLADE) ×1 IMPLANT
BLADE SURG 15 STRL SS (BLADE) ×2
CATH FOLEY 2W COUNCIL 5CC 18FR (CATHETERS) ×1 IMPLANT
CATH FOLEY 2WAY SLVR  5CC 20FR (CATHETERS) ×2
CATH FOLEY 2WAY SLVR 5CC 20FR (CATHETERS) ×1 IMPLANT
CATH SET URETHRAL DILATOR (CATHETERS) ×1 IMPLANT
CATH URET 5FR 28IN OPEN ENDED (CATHETERS) IMPLANT
CLOTH BEACON ORANGE TIMEOUT ST (SAFETY) ×2 IMPLANT
COVER WAND RF STERILE (DRAPES) IMPLANT
ELECT REM PT RETURN 15FT ADLT (MISCELLANEOUS) ×1 IMPLANT
GLOVE BIO SURGEON STRL SZ7.5 (GLOVE) ×4 IMPLANT
GLOVE SURG SS PI 8.0 STRL IVOR (GLOVE) ×1 IMPLANT
GOWN STRL REUS W/TWL XL LVL3 (GOWN DISPOSABLE) ×4 IMPLANT
GUIDEWIRE STR DUAL SENSOR (WIRE) ×1 IMPLANT
KIT TURNOVER KIT A (KITS) ×1 IMPLANT
MANIFOLD NEPTUNE II (INSTRUMENTS) ×2 IMPLANT
NDL ASPIRATION 22 (NEEDLE) ×1 IMPLANT
NDL SAFETY ECLIPSE 18X1.5 (NEEDLE) IMPLANT
NEEDLE ASPIRATION 22 (NEEDLE) ×2 IMPLANT
NEEDLE HYPO 18GX1.5 SHARP (NEEDLE) ×2
NEEDLE HYPO 22GX1.5 SAFETY (NEEDLE) ×2 IMPLANT
PACK CYSTO (CUSTOM PROCEDURE TRAY) ×2 IMPLANT
PENCIL SMOKE EVACUATOR (MISCELLANEOUS) IMPLANT
PLUG CATH AND CAP STER (CATHETERS) ×1 IMPLANT
SPONGE DRAIN TRACH 4X4 STRL 2S (GAUZE/BANDAGES/DRESSINGS) ×1 IMPLANT
SUT ETHILON 2 0 PS N (SUTURE) ×1 IMPLANT
SYR CONTROL 10ML LL (SYRINGE) ×1 IMPLANT
TOWEL OR 17X26 10 PK STRL BLUE (TOWEL DISPOSABLE) ×1 IMPLANT
TUBING CONNECTING 10 (TUBING) ×1 IMPLANT
WATER STERILE IRR 3000ML UROMA (IV SOLUTION) ×2 IMPLANT

## 2019-09-06 NOTE — Anesthesia Procedure Notes (Signed)
Procedure Name: LMA Insertion Date/Time: 09/06/2019 12:06 PM Performed by: Gerald Leitz, CRNA Pre-anesthesia Checklist: Patient identified, Patient being monitored, Timeout performed, Emergency Drugs available and Suction available Patient Re-evaluated:Patient Re-evaluated prior to induction Oxygen Delivery Method: Circle system utilized Preoxygenation: Pre-oxygenation with 100% oxygen Induction Type: IV induction Ventilation: Mask ventilation without difficulty LMA: LMA inserted and LMA with gastric port inserted LMA Size: 4.0 Tube type: Oral Number of attempts: 1 Placement Confirmation: positive ETCO2 and breath sounds checked- equal and bilateral Tube secured with: Tape Dental Injury: Teeth and Oropharynx as per pre-operative assessment

## 2019-09-06 NOTE — Anesthesia Postprocedure Evaluation (Signed)
Anesthesia Post Note  Patient: Laura Roberts  Procedure(s) Performed: CYSTOSCOPY BOTOX INJECTION (N/A ) SUPRAPUBIC TUBE CHANGE (N/A )     Patient location during evaluation: PACU Anesthesia Type: General Level of consciousness: sedated and patient cooperative Pain management: pain level controlled Vital Signs Assessment: post-procedure vital signs reviewed and stable Respiratory status: spontaneous breathing Cardiovascular status: stable Anesthetic complications: no    Last Vitals:  Vitals:   09/06/19 1345 09/06/19 1400  BP: (!) 148/76 (!) 152/84  Pulse: 88 88  Resp: 13 13  Temp:    SpO2: 99% 98%    Last Pain:  Vitals:   09/06/19 1400  TempSrc:   PainSc: [name]

## 2019-09-06 NOTE — Anesthesia Preprocedure Evaluation (Signed)
Anesthesia Evaluation  Patient identified by MRN, date of birth, ID band Patient awake    Reviewed: Allergy & Precautions, H&P , NPO status , Patient's Chart, lab work & pertinent test results  Airway Mallampati: III  TM Distance: >3 FB Neck ROM: Full    Dental no notable dental hx. (+) Teeth Intact, Dental Advisory Given   Pulmonary neg pulmonary ROS,    Pulmonary exam normal breath sounds clear to auscultation       Cardiovascular hypertension, Pt. on medications  Rhythm:Regular Rate:Normal     Neuro/Psych PSYCHIATRIC DISORDERS Depression  Neuromuscular disease    GI/Hepatic Neg liver ROS, GERD  Medicated and Controlled,  Endo/Other  diabetes, Insulin DependentHyperthyroidism Morbid obesity  Renal/GU negative Renal ROS  negative genitourinary   Musculoskeletal  (+) Arthritis , Osteoarthritis,    Abdominal   Peds  Hematology negative hematology ROS (+)   Anesthesia Other Findings   Reproductive/Obstetrics negative OB ROS                             Anesthesia Physical  Anesthesia Plan  ASA: III  Anesthesia Plan: General   Post-op Pain Management:    Induction: Intravenous  PONV Risk Score and Plan: 4 or greater and Ondansetron, Treatment may vary due to age or medical condition and Dexamethasone  Airway Management Planned: LMA  Additional Equipment:   Intra-op Plan:   Post-operative Plan: Extubation in OR  Informed Consent: I have reviewed the patients History and Physical, chart, labs and discussed the procedure including the risks, benefits and alternatives for the proposed anesthesia with the patient or authorized representative who has indicated his/her understanding and acceptance.     Dental advisory given  Plan Discussed with: CRNA  Anesthesia Plan Comments:         Anesthesia Quick Evaluation

## 2019-09-06 NOTE — Transfer of Care (Signed)
Immediate Anesthesia Transfer of Care Note  Patient: Laura Roberts  Procedure(s) Performed: Procedure(s): CYSTOSCOPY BOTOX INJECTION (N/A) SUPRAPUBIC TUBE CHANGE (N/A)  Patient Location: PACU  Anesthesia Type:General  Level of Consciousness: Alert, Awake, Oriented  Airway & Oxygen Therapy: Patient Spontanous Breathing  Post-op Assessment: Report given to RN  Post vital signs: Reviewed and stable  Last Vitals:  Vitals:   09/06/19 0854  BP: (!) 157/85  Pulse: 83  Resp: 18  Temp: 36.8 C  SpO2: 0000000    Complications: No apparent anesthesia complications

## 2019-09-06 NOTE — Op Note (Signed)
Operative Note  Preoperative diagnosis:  1.  Neurogenic bladder  Postoperative diagnosis: 1.  Neurogenic bladder  Procedure(s): 1.  Cystoscopy with Botox injection 2.  Complicated suprapubic tube exchange  Surgeon: Link Snuffer, MD  Assistants: None  Anesthesia: General  Complications: None immediate  EBL: Minimal  Specimens: 1.  None  Drains/Catheters: 1.  3 French council tip catheter as a suprapubic tube  Intraoperative findings: Wide open patulous urethra for which the bladder could be digitally inspected.  Diffuse edema within the bladder consistent with catheter related change.  Indication: 79 year old female with neurogenic bladder currently managed with suprapubic tube and Botox presents for the previously mentioned operation  Description of procedure:  The patient was identified and consent was obtained.  The patient was taken to the operating room and placed in the supine position.  The patient was placed under general anesthesia.  Perioperative antibiotics were administered.  The patient was placed in dorsal lithotomy.  Patient was prepped and draped in a standard sterile fashion and a timeout was performed.  Rigid cystoscope was advanced into the urethra and into the bladder.  Cystoscopy was performed.  200 units of intravesical Botox was systematically injected throughout the bladder.  I advanced a wire through the 12 French pigtail suprapubic tube and removed the suprapubic tube.  I then systematically dilated from 12 [place] up to 22 [place].  I then advanced a 73 [place] council tip catheter over the wire and into the bladder and instilled 10 cc of sterile water into the catheter balloon.  This cleared the operation.  Patient tolerated the procedure well and was stable postoperatively.  Plan: Follow-up in about 4 to 6 months for repeat Botox.  Continue monthly suprapubic tube changes

## 2019-09-06 NOTE — H&P (Signed)
CC/HPI: cc: Neurogenic bladder  HPI:  10/31/17  A 79 year old female with a history of MS and neurogenic bladder. He recently underwent a suprapubic tube placement on 09/09/2017. She was admitted to the hospital with cellulitis around the suprapubic tube site and was seen by Dr. Junious Silk. She has improved from this. She had a SP tube change on 10/13/2017. This was exchanged for a 14 [place] tube. She presents for follow-up. She was recently hospitalized for a groin abscess that was incised and drained. She is happy now with her suprapubic tube. She has mild leakage around the catheter but not much. Infection has improved.   11/17/2017:  Patient presents today for suprapubic change. She has no other complaints.   12/03/17: Patient presents today with complaints of decreased urinary output from her SP tube site and increased urethral leakage. She states that this has been ongoing for the past week. She is having to wear 2 adult diapers per day, which are typically soaked. She denies any current suprapubic discomfort or abdominal pain. She denies fever or gross hematuria. No nausea or vomiting. She currently uses Detrol for bladder spasms.   12/23/17: She returns today with complaints of increased urinary leakage per her urethra. She remains on Detrol. She was seen in the ED on 6/14 after her SP catheter came out and was not able to be replaced her rehab facility. Per her report, they had a difficult time replaced the catheter and a 12 Fr. SP catheter was eventually placed. Today she states that it intermittently drains well. She states that is draining okay currently, but was not draining well this morning. She notes that when it is not draining, she is having increased leakage per her urethra. She denies fevers or gross hematuria. No suprapubic discomfort.   01/06/2018:  The patient continues with a suprapubic catheter but continues to have significant leakage per urethra. She is interested and entered detrusor  Botox. She has failed oral agents including Detrol.   05/05/2018:  Patient is status post cystoscopy with Botox instillation on 01/30/2018. She initially did well with this. Urine leakage decreased. Of note, during the surgery, she had a very patulous urethra and I was able to insert a finger into the bladder. However, Botox did improve her symptoms of leakage and bladder spasms. Over the past few days, she had an increase in spasms and leakage and the catheter was not draining but after manipulation her catheter has gone back to normal.+   07/06/18: She returns today for follow up. She is s/p Botox instillation as noted above. She presents today with complications regarding SP tube. She states that her SP tube came out late Saturday evening and they were unable to replace this at her SNF. She states that it was draining well up until that point in time. Until this point, she states she has been having some leakage per her urethra intermittently, but generally felt that she had been doing well. Since SP catheter has been out, she has had significant amount of leakage per her urethra since that time and is soaking diapers. No fevers or chills.   07/23/18:  Patient is s/p replacement of SPT, which she underwent on 07/15/18. She c/o bladder spasms. She takes [employer]. SPT has been draining clear, yellow urine.   08/04/2018  Patient had her suprapubic tube replaced on 07/15/2018. She wants it up size. Unfortunately, cannot upsize currently since it has only been 2 weeks and the tract is not mature. She has not had  Botox in about 6 months. She desires this as well.   09/06/2019  Patient presents today for repeat Botox and suprapubic tube change    ALLERGIES: Codeine Januvia Tramadol - Hives    MEDICATIONS: Omeprazole 20 mg tablet, delayed release  Acetaminophen  Amlodipine Besylate 10 mg tablet  Artificial Tears  Baclofen 10 mg tablet  Calcium Citrate  Cymbalta 30 mg capsule,delayed release  Duloxetine  Hcl 30 mg capsule,delayed release  Gabapentin 300 mg capsule  Glimepiride 2 mg tablet  Ipratropium Bromide  Lantus  Lidoderm  Methimazole 5 mg tablet  Minerin cream  Miralax  Mucinex D 600 mg-60 mg tablet, extended release 12 hr  Pantoprazole Sodium 40 mg tablet, delayed release  Prednisone 5 mg tablet  Prednisone 5 mg tablet  Prednisone 2.5 mg tablet  Restasis 0.05 % dropperette, single-use drop dispenser  Senna  Solifenacin Succinate 10 mg tablet  Tecfidera 240 mg capsule,delayed release  Tinactin  Vitamin D3     Notes: Detrol [place] rx faxed to [facility]   GU PSH: Compl Change SP Tube - 01/30/2018, 11/17/2017 Cystourethroscopy, W/Injection For Chemodenervation Of Bladder - 01/30/2018 Simple Change SP Tube - 12/03/2017, 11/17/2017    NON-GU PSH: No Non-GU PSH    GU PMH: Bladder, Neuromuscular dysfunction, Unspec - 07/23/2018 Urinary Tract Inf, Unspec site - 07/23/2018 Detrusor overactivity - 01/06/2018 Unihibited neuropathic bladder - 01/06/2018 Areflexic bladder - 09/16/2017, - 09/03/2017    NON-GU PMH: Hypercholesterolemia Hypertension Major depressive disorder, recurrent, in partial remission Multiple sclerosis Spinal stenosis, thoracic region    FAMILY HISTORY: No Family History    SOCIAL HISTORY: Marital Status: Unknown Preferred Language: English; Ethnicity: Not Hispanic Or Latino; Race: Black or African American Current Smoking Status: Patient has never smoked.   Tobacco Use Assessment Completed: Used Tobacco in last 30 days? Does not use smokeless tobacco. Has never drank.  Does not use drugs. Does not drink caffeine. Has not had a blood transfusion.    REVIEW OF SYSTEMS:    GU Review Female:   Patient denies frequent urination, hard to postpone urination, burning /pain with urination, get up at night to urinate, leakage of urine, stream starts and stops, trouble starting your stream, have to strain to urinate, and being pregnant.  Gastrointestinal (Upper):   Patient  denies nausea, vomiting, and indigestion/ heartburn.  Gastrointestinal (Lower):   Patient denies diarrhea and constipation.  Constitutional:   Patient denies fever, night sweats, weight loss, and fatigue.  Skin:   Patient denies skin rash/ lesion and itching.  Eyes:   Patient denies blurred vision and double vision.  Ears/ Nose/ Throat:   Patient denies sore throat and sinus problems.  Hematologic/Lymphatic:   Patient denies swollen glands and easy bruising.  Cardiovascular:   Patient denies leg swelling and chest pains.  Respiratory:   Patient denies cough and shortness of breath.  Endocrine:   Patient denies excessive thirst.  Musculoskeletal:   Patient denies back pain and joint pain.  Neurological:   Patient denies headaches and dizziness.  Psychologic:   Patient denies depression and anxiety.   VITAL SIGNS:      08/04/2018 11:07 AM  BP 141/87 mmHg  Heart Rate 101 /min  Temperature 98.4 F / 36.8 C   MULTI-SYSTEM PHYSICAL EXAMINATION:    Constitutional: Well-nourished. In a wheelchair  Neck: Neck symmetrical, not swollen. Normal tracheal position.  Respiratory: No labored breathing, no use of accessory muscles.   Cardiovascular: Normal temperature, adequate perfusion of extremities  Skin: No paleness, no jaundice  Neurologic / Psychiatric: Oriented to time, oriented to place, oriented to person. No depression, no anxiety, no agitation.  Gastrointestinal: Obese abdomen. Suprapubic tube presen draining yellow urine. No mass, no tenderness, no rigidity.   Eyes: Normal conjunctivae. Normal eyelids.  Musculoskeletal: Normal gait and station of head and neck. Uses a wheelchair.     PAST DATA REVIEWED:  Source Of History:  Patient  Records Review:   Previous Patient Records   PROCEDURES: None   ASSESSMENT:      ICD-10 Details  1 GU:   Bladder, Neuromuscular dysfunction, Unspec - N31.9   2   Detrusor overactivity - N31.1    PLAN:           Document Letter(s):  Created for  Patient: Clinical Summary         Notes:   Plan for repeat Botox in the operating room. I will change the suprapubic tube at that time

## 2019-09-07 ENCOUNTER — Encounter: Payer: Self-pay | Admitting: *Deleted

## 2019-09-09 ENCOUNTER — Other Ambulatory Visit (HOSPITAL_COMMUNITY): Payer: Medicare Other

## 2019-09-28 ENCOUNTER — Other Ambulatory Visit: Payer: Self-pay

## 2019-09-28 ENCOUNTER — Non-Acute Institutional Stay: Payer: Medicare Other | Admitting: Hospice

## 2019-09-28 DIAGNOSIS — Z515 Encounter for palliative care: Secondary | ICD-10-CM

## 2019-09-28 DIAGNOSIS — G35 Multiple sclerosis: Secondary | ICD-10-CM

## 2019-09-28 NOTE — Progress Notes (Signed)
[facility] Consult Note Telephone: [phone]  Fax: [phone]  PATIENT NAME: Laura Roberts DOB: 07-Dec-1940 MRN: [id number]  PRIMARY CARE PROVIDER:   [name], MD  REFERRING PROVIDER:  [name], [address] [place],  [place] [address]  RESPONSIBLE PARTY:   Self Extended Emergency Contact Information Primary Emergency Contact: Liss,Leroy III [place], [place] Phone: [phone] Relation: Son Secondary Emergency Contact: Crumpacker,Tyrone [place] Phone: [phone] Mobile Phone: [phone] Relation: Son   RECOMMENDATIONS/PLAN:   Advance Care Planning/Goals of Care:  Visit consisted of building trust and discussions on Palliative Medicine as specialized medical care for people living with serious illness, aimed at facilitating better quality of life through symptoms relief, assisting with advance care plan and establishing goals of care. Patient affirmed she is a DNR. She explained she took the decision to save her children the ordeal of taking the decision. Therapeutic listening and validation provided. DNR in [place]. Goals of care include to maximize quality of life and symptom management. She requested discussion on MOST form next visit. Called Leane Para and left him a voicemail with call back number. ymptom management:  suprapubic tube replaced 09/06/2019 with repeat botox   injection related to neurogenic bladder and MS. F/u with [facility] 12/29/2019.  Patient denied spasm at this time; spasm well managed with neurontin and baclofen. Type 2 DM managed with Trulicity, last and Lantus. Patient is bedbound, hoyer lift for transfers, able to feed self after set up.She said she requested air mattress from her [employer] provider.  She denied moodiness, said she participates in [place] and other facility activities. She loves to color and has some colored works on  display in her room. Nursing staff with no concerns. Encouraged ongoing care.  Follow up: Palliative care will continue to follow patient for goals of care clarification and symptom management. I spent 76 minutes providing this consultation; time includes chart review and documentation. More than 50% of the time in this consultation was spent on coordinating communication  HISTORY OF PRESENT ILLNESS:  HISTORY OF PRESENT ILLNESS:  Laura Roberts is a 79 y.o. year old female with multiple medical problems including MS, paraplegia, depression, h/o fracture, GED, DM2, HTN, neurogenic pain of LE, suprapubic urinary catheter. Palliative Care was asked to help address goals of care.   CODE STATUS: DNR  PPS: 30% HOSPICE ELIGIBILITY/DIAGNOSIS: TBD  PAST MEDICAL HISTORY:  Past Medical History:  Diagnosis Date  . Age related osteoporosis   . Aphasia   . Bronchitis    hx of   . Cellulitis and abscess of toe    hx of   . Constipation   . Cystostomy in place [facility])   . Degenerative arthritis    osteoarthritis   . Depression   . Diabetes mellitus without complication ([place])    type 2   . Edema    localized   . Full incontinence of feces   . Gait disturbance   . GERD (gastroesophageal reflux disease)   . Gout    hx of   . History of falling   . Hyperlipidemia   . Hypertension   . Hyperthyroidism   . Multiple sclerosis ([place])   . Neuromuscular disorder (HCC)    Bilateral hand carpal tunnel syndrome  . Neuromuscular dysfunction of bladder   . Nontoxic multinodular goiter   . Obesity   . Osteoporosis   . Other adrenocortical insufficiency ([place])   .  Paraplegia (HCC)    LEGS  . Personal history of urinary (tract) infections   . Polyneuropathy   . Pressure ulcer of right buttock, stage 4 ([place])   . Spinal stenosis in cervical region   . Spinal stenosis of lumbosacral region   . Spinal stenosis, thoracic   . Tinea unguium   . Vitamin D deficiency     SOCIAL HX:  Social History    Tobacco Use  . Smoking status: Never Smoker  . Smokeless tobacco: Never Used  Substance Use Topics  . Alcohol use: No    ALLERGIES:  Allergies  Allergen Reactions  . Codeine Other (See Comments)    Difficult breathing and skin problem  . Ultram [Tramadol] Other (See Comments)    Difficult breathing and skin peeling  . Januvia [Sitagliptin] Rash and Other (See Comments)    Blisters     PERTINENT MEDICATIONS:  Outpatient Encounter Medications as of 09/28/2019  Medication Sig  . acetaminophen (TYLENOL) 325 MG tablet Take 650 mg by mouth every 6 (six) hours as needed for mild pain or fever.   . baclofen (LIORESAL) 10 MG tablet Take 10-15 mg by mouth See admin instructions. (0800 & 1200) TAKE 1 TABLET (15 MG) BY MOUTH ONCE DAILY IN THE MORNING & TAKE 1 TABLET (10 MG) BY MOUTH AT NOON.  . baclofen (LIORESAL) 20 MG tablet Take 20 mg by mouth at bedtime.   . barrier cream (NON-SPECIFIED) CREA Apply 1 application topically 2 (two) times daily. Apply to right ischium and bilateral buttocks  . bisacodyl (DULCOLAX) 10 MG suppository Place 10 mg rectally as needed for moderate constipation.  . calcium citrate (CALCITRATE - DOSED IN MG ELEMENTAL CALCIUM) 950 MG tablet Take 200 mg of elemental calcium by mouth daily. (0800)  . Carboxymeth-Glycerin-Polysorb (REFRESH OPTIVE ADVANCED) 0.5-1-0.5 % SOLN Apply 1 drop to eye 4 (four) times daily.   . cholecalciferol (VITAMIN D) 1000 UNITS tablet Take 1,000 Units by mouth daily. (0800)  . cycloSPORINE (RESTASIS) 0.05 % ophthalmic emulsion Place 1 drop into both eyes 2 (two) times daily.   Marland Kitchen Dextran 70-Hypromellose (GENTEAL TEARS) 0.1-0.3 % SOLN Place 1 drop into both eyes 4 (four) times daily as needed (DRY/IRRITATED EYES.).   Marland Kitchen Dulaglutide (TRULICITY) 1.5 0000000 SOPN Inject 1.5 mg into the skin once a week. (1300)  . DULoxetine (CYMBALTA) 30 MG capsule Take 30 mg by mouth daily. (0800) Give along with 60 mg to = 90 mg  . DULoxetine (CYMBALTA) 60 MG  capsule Take 60 mg by mouth daily. (0800)Give along with 30 mg to = 90 mg  . furosemide (LASIX) 40 MG tablet Take 40 mg by mouth daily. (0800)  . gabapentin (NEURONTIN) 400 MG capsule Take 400 mg by mouth 3 (three) times daily. (0800, 1400, & 2000)  . guaiFENesin (MUCINEX) 600 MG 12 hr tablet Take 1,200 mg by mouth at bedtime. (2000)  . Insulin Glargine (LANTUS SOLOSTAR) 100 UNIT/ML Solostar Pen Inject 25 Units into the skin at bedtime.   . insulin lispro (HUMALOG) 100 UNIT/ML injection Inject 7-12 Units into the skin See admin instructions. CHECK FSBS BEFORE EACH MEAL IF CBG IS 200-300=7U, 301-400=12U, GREATER THAN 400 CALL MD (PRIME PEN WITH 2 UNITS PRIOR TO EACH USE - PEN EXP  . ipratropium-albuterol (DUONEB) 0.5-2.5 (3) MG/3ML SOLN Take 3 mLs by nebulization every 6 (six) hours as needed (wheezing/coughing).   Marland Kitchen linaclotide (LINZESS) 72 MCG capsule Take 72 mcg by mouth daily before breakfast. (0800)  . magnesium hydroxide (MILK  OF MAGNESIA) 400 MG/5ML suspension Take 30 mLs by mouth daily as needed for mild constipation.  . magnesium oxide (MAG-OX) 400 MG tablet Take 400 mg by mouth every morning. (1000)  . methimazole (TAPAZOLE) 5 MG tablet Take 1 tablet (5 mg total) by mouth daily. (Patient taking differently: Take 5 mg by mouth daily at 6 (six) AM. )  . Multiple Vitamin (MULTIVITAMIN WITH MINERALS) TABS tablet Take 1 tablet by mouth daily. (0800)  . ondansetron (ZOFRAN) 4 MG tablet Take 4 mg by mouth every 6 (six) hours as needed for nausea or vomiting.  . pantoprazole (PROTONIX) 40 MG tablet Take 40 mg by mouth daily at 6 (six) AM.   . polyethylene glycol (MIRALAX / GLYCOLAX) packet Take 17 g by mouth daily as needed (constipation.).   Marland Kitchen predniSONE (DELTASONE) 2.5 MG tablet Take 2.5 mg by mouth every other day. (0800) Alternating days with 5 mg dose  . predniSONE (DELTASONE) 5 MG tablet Take 5 mg by mouth every other day. (0800) Alternating days with 2.5 mg dose  . promethazine (PHENERGAN)  25 MG tablet Take 25 mg by mouth every 6 (six) hours as needed for nausea or vomiting. Max 3 doses per 24 hours  . senna-docusate (SENOKOT-S) 8.6-50 MG tablet Take 1 tablet by mouth at bedtime. (2000)  . Skin Protectants, Misc. (MINERIN) CREA Apply 1 application topically 2 (two) times daily.   . Sodium Phosphates (RA SALINE ENEMA RE) Place 1 enema rectally daily as needed (constipation).  . solifenacin (VESICARE) 10 MG tablet Take 10 mg by mouth daily. (0800)  . spironolactone (ALDACTONE) 100 MG tablet Take 100 mg by mouth daily. (0800)  . TECFIDERA 240 MG CPDR Take 1 capsule (240 mg total) by mouth 2 (two) times daily.   No facility-administered encounter medications on file as of 09/28/2019.   Physical Exam/ROS General: patient lying in bed in NAD Cardiovascular: regular rate and rhythm Pulmonary: lung sounds clear, normal respiratory effort 95% RA Abdomen: soft, obese, nontender, + bowel sounds, bms WNL [id number] catheter in place with clear yellow urine noted in bag Extremities:traceedema, MS induced joint deformities not walked in >3 years.  Skin: no rashes, no wounds to exposed skin Neurological: Weakness, alert and oriented x 3  Teodoro Spray, NP

## 2019-10-21 ENCOUNTER — Other Ambulatory Visit (HOSPITAL_COMMUNITY): Payer: Self-pay | Admitting: Physician Assistant

## 2019-10-21 ENCOUNTER — Other Ambulatory Visit (HOSPITAL_COMMUNITY): Payer: Self-pay | Admitting: Urology

## 2019-10-21 DIAGNOSIS — N319 Neuromuscular dysfunction of bladder, unspecified: Secondary | ICD-10-CM

## 2019-10-21 LAB — HM DIABETES EYE EXAM

## 2019-10-27 ENCOUNTER — Other Ambulatory Visit: Payer: Self-pay | Admitting: Radiology

## 2019-10-28 ENCOUNTER — Encounter: Payer: Self-pay | Admitting: Internal Medicine

## 2019-10-28 ENCOUNTER — Other Ambulatory Visit: Payer: Self-pay

## 2019-10-28 ENCOUNTER — Encounter (HOSPITAL_COMMUNITY): Payer: Self-pay

## 2019-10-28 ENCOUNTER — Ambulatory Visit (HOSPITAL_COMMUNITY)
Admission: RE | Admit: 2019-10-28 | Discharge: 2019-10-28 | Disposition: A | Discharge status: Home / Self Care | Payer: Medicare Other | Source: Ambulatory Visit | Attending: Urology | Admitting: Urology

## 2019-10-28 ENCOUNTER — Ambulatory Visit (HOSPITAL_COMMUNITY)
Admission: RE | Admit: 2019-10-28 | Discharge: 2019-10-28 | Disposition: A | Discharge status: Home / Self Care | Payer: Medicare Other | Source: Ambulatory Visit | Attending: Internal Medicine | Admitting: Internal Medicine

## 2019-10-28 ENCOUNTER — Non-Acute Institutional Stay (SKILLED_NURSING_FACILITY): Payer: Medicare Other | Admitting: Internal Medicine

## 2019-10-28 DIAGNOSIS — M109 Gout, unspecified: Secondary | ICD-10-CM | POA: Diagnosis not present

## 2019-10-28 DIAGNOSIS — D72829 Elevated white blood cell count, unspecified: Secondary | ICD-10-CM

## 2019-10-28 DIAGNOSIS — E46 Unspecified protein-calorie malnutrition: Secondary | ICD-10-CM

## 2019-10-28 DIAGNOSIS — E039 Hypothyroidism, unspecified: Secondary | ICD-10-CM | POA: Diagnosis not present

## 2019-10-28 DIAGNOSIS — G35 Multiple sclerosis: Secondary | ICD-10-CM | POA: Diagnosis not present

## 2019-10-28 DIAGNOSIS — E119 Type 2 diabetes mellitus without complications: Secondary | ICD-10-CM | POA: Insufficient documentation

## 2019-10-28 DIAGNOSIS — G8220 Paraplegia, unspecified: Secondary | ICD-10-CM

## 2019-10-28 DIAGNOSIS — M810 Age-related osteoporosis without current pathological fracture: Secondary | ICD-10-CM | POA: Insufficient documentation

## 2019-10-28 DIAGNOSIS — E669 Obesity, unspecified: Secondary | ICD-10-CM | POA: Diagnosis not present

## 2019-10-28 DIAGNOSIS — F329 Major depressive disorder, single episode, unspecified: Secondary | ICD-10-CM | POA: Insufficient documentation

## 2019-10-28 DIAGNOSIS — Z79899 Other long term (current) drug therapy: Secondary | ICD-10-CM | POA: Diagnosis not present

## 2019-10-28 DIAGNOSIS — N319 Neuromuscular dysfunction of bladder, unspecified: Secondary | ICD-10-CM | POA: Insufficient documentation

## 2019-10-28 DIAGNOSIS — D473 Essential (hemorrhagic) thrombocythemia: Secondary | ICD-10-CM | POA: Insufficient documentation

## 2019-10-28 DIAGNOSIS — E876 Hypokalemia: Secondary | ICD-10-CM

## 2019-10-28 DIAGNOSIS — K219 Gastro-esophageal reflux disease without esophagitis: Secondary | ICD-10-CM | POA: Insufficient documentation

## 2019-10-28 DIAGNOSIS — Z794 Long term (current) use of insulin: Secondary | ICD-10-CM | POA: Insufficient documentation

## 2019-10-28 DIAGNOSIS — F339 Major depressive disorder, recurrent, unspecified: Secondary | ICD-10-CM

## 2019-10-28 DIAGNOSIS — E785 Hyperlipidemia, unspecified: Secondary | ICD-10-CM | POA: Diagnosis not present

## 2019-10-28 DIAGNOSIS — Z6836 Body mass index (BMI) 36.0-36.9, adult: Secondary | ICD-10-CM | POA: Diagnosis not present

## 2019-10-28 DIAGNOSIS — Z9359 Other cystostomy status: Secondary | ICD-10-CM

## 2019-10-28 DIAGNOSIS — I10 Essential (primary) hypertension: Secondary | ICD-10-CM | POA: Diagnosis not present

## 2019-10-28 DIAGNOSIS — D75839 Thrombocytosis, unspecified: Secondary | ICD-10-CM

## 2019-10-28 LAB — CBC WITH DIFFERENTIAL/PLATELET
Abs Immature Granulocytes: 0.06 10*3/uL (ref 0.00–0.07)
Basophils Absolute: 0.1 10*3/uL (ref 0.0–0.1)
Basophils Relative: 0 %
Eosinophils Absolute: 0.3 10*3/uL (ref 0.0–0.5)
Eosinophils Relative: 3 %
HCT: 42.3 % (ref 36.0–46.0)
Hemoglobin: 13.7 g/dL (ref 12.0–15.0)
Immature Granulocytes: 1 %
Lymphocytes Relative: 17 %
Lymphs Abs: 2.1 10*3/uL (ref 0.7–4.0)
MCH: 27.8 pg (ref 26.0–34.0)
MCHC: 32.4 g/dL (ref 30.0–36.0)
MCV: 85.8 fL (ref 80.0–100.0)
Monocytes Absolute: 0.9 10*3/uL (ref 0.1–1.0)
Monocytes Relative: 8 %
Neutro Abs: 8.8 10*3/uL — ABNORMAL HIGH (ref 1.7–7.7)
Neutrophils Relative %: 71 %
Platelets: 337 10*3/uL (ref 150–400)
RBC: 4.93 MIL/uL (ref 3.87–5.11)
RDW: 17.1 % — ABNORMAL HIGH (ref 11.5–15.5)
WBC: 12.3 10*3/uL — ABNORMAL HIGH (ref 4.0–10.5)
nRBC: 0 % (ref 0.0–0.2)

## 2019-10-28 LAB — GLUCOSE, CAPILLARY
Glucose-Capillary: 80 mg/dL (ref 70–99)
Glucose-Capillary: 82 mg/dL (ref 70–99)

## 2019-10-28 LAB — BASIC METABOLIC PANEL
Anion gap: 15 (ref 5–15)
BUN: 9 mg/dL (ref 8–23)
CO2: 42 mmol/L — ABNORMAL HIGH (ref 22–32)
Calcium: 9.3 mg/dL (ref 8.9–10.3)
Chloride: 86 mmol/L — ABNORMAL LOW (ref 98–111)
Creatinine, Ser: 0.51 mg/dL (ref 0.44–1.00)
GFR calc Af Amer: >60 mL/min (ref >60)
GFR calc non Af Amer: >60 mL/min (ref >60)
Glucose, Bld: 78 mg/dL (ref 70–99)
Potassium: 2.3 mmol/L — CL (ref 3.5–5.1)
Sodium: 143 mmol/L (ref 135–145)

## 2019-10-28 LAB — PROTIME-INR
INR: 1.1 (ref 0.8–1.2)
Prothrombin Time: 13.8 seconds (ref 11.4–15.2)

## 2019-10-28 MED ORDER — FENTANYL CITRATE (PF) 100 MCG/2ML IJ SOLN
INTRAMUSCULAR | Status: AC | PRN
  Administered 2019-10-28 (×2): 50 ug via INTRAVENOUS

## 2019-10-28 MED ORDER — SODIUM CHLORIDE 0.9 % IV SOLN
1.0000 g | Freq: Once | INTRAVENOUS | Status: AC
  Administered 2019-10-28: 1 g via INTRAVENOUS
  Filled 2019-10-28: qty 1

## 2019-10-28 MED ORDER — POTASSIUM CHLORIDE CRYS ER 20 MEQ PO TBCR
40.0000 meq | EXTENDED_RELEASE_TABLET | Freq: Once | ORAL | Status: AC
  Administered 2019-10-28: 40 meq via ORAL
  Filled 2019-10-28: qty 2

## 2019-10-28 MED ORDER — MIDAZOLAM HCL 2 MG/2ML IJ SOLN
INTRAMUSCULAR | Status: AC
  Filled 2019-10-28: qty 4

## 2019-10-28 MED ORDER — SODIUM CHLORIDE 0.9 % IV SOLN
INTRAVENOUS | Status: AC
  Filled 2019-10-28: qty 250

## 2019-10-28 MED ORDER — FENTANYL CITRATE (PF) 100 MCG/2ML IJ SOLN
INTRAMUSCULAR | Status: AC
  Filled 2019-10-28: qty 2

## 2019-10-28 MED ORDER — LIDOCAINE HCL (PF) 1 % IJ SOLN
INTRAMUSCULAR | Status: AC | PRN
  Administered 2019-10-28: 10 mL

## 2019-10-28 MED ORDER — MIDAZOLAM HCL 2 MG/2ML IJ SOLN
INTRAMUSCULAR | Status: AC | PRN
  Administered 2019-10-28: 1 mg via INTRAVENOUS

## 2019-10-28 MED ORDER — SODIUM CHLORIDE 0.9 % IV SOLN: INTRAVENOUS | Status: DC

## 2019-10-28 NOTE — Assessment & Plan Note (Signed)
White count varies from 11.8-12.3 with no associated constitutional or infectious symptoms.  This may be related to low-dose maintenance steroid administration

## 2019-10-28 NOTE — Assessment & Plan Note (Signed)
10/28/2019 CT replacement.  Balloon had burst and catheter had come out.  Suprapubic catheter necessary to prevent fecal soiling of GU tract with risk of UTIs and sepsis.

## 2019-10-28 NOTE — Patient Instructions (Signed)
See assessment and plan under each diagnosis in the problem list and acutely for this visit 

## 2019-10-28 NOTE — H&P (Signed)
Chief Complaint: Patient was seen in consultation today for urinary incontinence/suprapubic catheter placement.  Referring Physician(s): [name]  Supervising Physician: Sandi Mariscal  Patient Status: [facility] - Out-pt  History of Present Illness: Laura Roberts is a 79 y.o. female with a past medical history of hypertension, hyperlipidemia, multiple sclerosis, paraplegia, bronchitis, GERD, urinary/fecal incontinence, hyperthyroidism, diabetes mellitus type II, pessure ulcer of right buttock stage IV, gout, OA, osteoporosis, obesity, and depression. She is known to IR- she had a suprapubic catheter placed in our department 09/09/2017 by Dr. Pascal Lux. It has been exchanged/replaced in our department 3 times since. Most recently, tube was replaced in IR 08/26/2019. Unfortunately, patient's suprapubic catheter tube was inadvertently displaced- per patient approximately 2 weeks ago.  IR requested by Dr. Gloriann Loan for possible image-guided suprapubic catheter placement/replacement. Patient awake and alert laying in bed with no complaints at this time. Denies fever, chills, chest pain, dyspnea, abdominal pain, or headache.   Past Medical History:  Diagnosis Date  . Age related osteoporosis   . Aphasia   . Bronchitis    hx of   . Cellulitis and abscess of toe    hx of   . Constipation   . Cystostomy in place [facility])   . Degenerative arthritis    osteoarthritis   . Depression   . Diabetes mellitus without complication ([place])    type 2   . Edema    localized   . Full incontinence of feces   . Gait disturbance   . GERD (gastroesophageal reflux disease)   . Gout    hx of   . History of falling   . Hyperlipidemia   . Hypertension   . Hyperthyroidism   . Multiple sclerosis ([place])   . Neuromuscular disorder (HCC)    Bilateral hand carpal tunnel syndrome  . Neuromuscular dysfunction of bladder   . Nontoxic multinodular goiter   . Obesity   . Osteoporosis   . Other adrenocortical  insufficiency ([place])   . Paraplegia (HCC)    LEGS  . Personal history of urinary (tract) infections   . Polyneuropathy   . Pressure ulcer of right buttock, stage 4 ([place])   . Spinal stenosis in cervical region   . Spinal stenosis of lumbosacral region   . Spinal stenosis, thoracic   . Tinea unguium   . Vitamin D deficiency     Past Surgical History:  Procedure Laterality Date  . ABDOMINAL HYSTERECTOMY    . BACK SURGERY    . BOTOX INJECTION N/A 01/30/2018   Procedure: CYSTOSCOPY BOTOX INJECTION, SUPRAPUBIC EXCHANGE;  Surgeon: Lucas Mallow, MD;  Location: [facility];  Service: Urology;  Laterality: N/A;  . BOTOX INJECTION N/A 09/02/2018   Procedure: BOTOX INJECTION;  Surgeon: Lucas Mallow, MD;  Location: [facility];  Service: Urology;  Laterality: N/A;  . BOTOX INJECTION N/A 05/10/2019   Procedure: CYSTOSCOPY BOTOX INJECTION, SUPRAPUBIC EXCHANGE;  Surgeon: Lucas Mallow, MD;  Location: [facility];  Service: Urology;  Laterality: N/A;  . BOTOX INJECTION N/A 09/06/2019   Procedure: CYSTOSCOPY BOTOX INJECTION;  Surgeon: Lucas Mallow, MD;  Location: [facility];  Service: Urology;  Laterality: N/A;  . CATARACT EXTRACTION Right   . CHOLECYSTECTOMY    . CYSTOSCOPY N/A 09/02/2018   Procedure: CYSTOSCOPY;  Surgeon: Lucas Mallow, MD;  Location: [facility];  Service: Urology;  Laterality: N/A;  . CYSTOSTOMY N/A 09/02/2018   Procedure: CYSTOSTOMY SUPRAPUBIC;  Surgeon: [name],  MD;  Location: [facility];  Service: Urology;  Laterality: N/A;  90 MINS  . FEMUR IM NAIL Right 06/02/2014   Procedure: INTRAMEDULLARY (IM) RETROGRADE FEMORAL NAILING;  Surgeon: Johnny Bridge, MD;  Location: [facility];  Service: Orthopedics;  Laterality: Right;  . GROIN DISSECTION Left 10/24/2017   Procedure: IRRIGATION AND DEBRIDEMENT, LEFT THIGH ABCESS ;  Surgeon: Alphonsa Overall, MD;  Location: [facility];  Service: General;  Laterality: Left;  . INSERTION OF SUPRAPUBIC CATHETER N/A 09/06/2019   Procedure: SUPRAPUBIC TUBE  CHANGE;  Surgeon: Lucas Mallow, MD;  Location: [facility];  Service: Urology;  Laterality: N/A;  . IR CATHETER TUBE CHANGE  10/13/2017  . IR CATHETER TUBE CHANGE  07/15/2018  . IR CATHETER TUBE CHANGE  08/26/2019  . left hand carpal tunnel surgery    . REPLACEMENT TOTAL KNEE BILATERAL  2004    Allergies: Codeine, Ultram [tramadol], and Januvia [sitagliptin]  Medications: Prior to Admission medications   Medication Sig Start Date End Date Taking? Authorizing Provider  acetaminophen (TYLENOL) 325 MG tablet Take 650 mg by mouth every 6 (six) hours as needed for mild pain or fever.     [provider]  baclofen (LIORESAL) 10 MG tablet Take 10-15 mg by mouth See admin instructions. (0800 & 1200) TAKE 1 TABLET (15 MG) BY MOUTH ONCE DAILY IN THE MORNING & TAKE 1 TABLET (10 MG) BY MOUTH AT NOON.    [provider]  baclofen (LIORESAL) 20 MG tablet Take 20 mg by mouth at bedtime.     [provider]  barrier cream (NON-SPECIFIED) CREA Apply 1 application topically 2 (two) times daily. Apply to right ischium and bilateral buttocks    [provider]  bisacodyl (DULCOLAX) 10 MG suppository Place 10 mg rectally as needed for moderate constipation.    [provider]  calcium citrate (CALCITRATE - DOSED IN MG ELEMENTAL CALCIUM) 950 MG tablet Take 200 mg of elemental calcium by mouth daily. (0800)    [provider]  Carboxymeth-Glycerin-Polysorb (REFRESH OPTIVE ADVANCED) 0.5-1-0.5 % SOLN Apply 1 drop to eye 4 (four) times daily.     [provider]  cholecalciferol (VITAMIN D) 1000 UNITS tablet Take 1,000 Units by mouth daily. (0800)    [provider]  cycloSPORINE (RESTASIS) 0.05 % ophthalmic emulsion Place 1 drop into both eyes 2 (two) times daily.     [provider]  Dextran 70-Hypromellose (GENTEAL TEARS) 0.1-0.3 % SOLN Place 1 drop into both eyes 4 (four) times daily as needed (DRY/IRRITATED EYES.).     [provider]  Dulaglutide (TRULICITY) 1.5 0000000 SOPN Inject 1.5 mg into the skin once a week. (1300)    [provider]  DULoxetine (CYMBALTA) 30 MG capsule Take 30 mg by mouth daily. (0800) Give along with 60 mg to = 90 mg    [provider]  DULoxetine (CYMBALTA) 60 MG capsule Take 60 mg by mouth daily. (0800)Give along with 30 mg to = 90 mg    [provider]  furosemide (LASIX) 40 MG tablet Take 40 mg by mouth daily. (0800)    [provider]  gabapentin (NEURONTIN) 400 MG capsule Take 400 mg by mouth 3 (three) times daily. (0800, 1400, & 2000)    [provider]  guaiFENesin (MUCINEX) 600 MG 12 hr tablet Take 1,200 mg by mouth at bedtime. (2000)    [provider]  Insulin Glargine (LANTUS SOLOSTAR) 100 UNIT/ML Solostar Pen Inject 25 Units into  the skin at bedtime.     [provider]  insulin lispro (HUMALOG) 100 UNIT/ML injection Inject 7-12 Units into the skin See admin instructions. CHECK FSBS BEFORE EACH MEAL IF CBG IS 200-300=7U, 301-400=12U, GREATER THAN 400 CALL MD (PRIME PEN WITH 2 UNITS PRIOR TO EACH USE - PEN EXP    [provider]  ipratropium-albuterol (DUONEB) 0.5-2.5 (3) MG/3ML SOLN Take 3 mLs by nebulization every 6 (six) hours as needed (wheezing/coughing).     [provider]  linaclotide (LINZESS) 72 MCG capsule Take 72 mcg by mouth daily before breakfast. (0800)    [provider]  magnesium hydroxide (MILK OF MAGNESIA) 400 MG/5ML suspension Take 30 mLs by mouth daily as needed for mild constipation.    [provider]  magnesium oxide (MAG-OX) 400 MG tablet Take 400 mg by mouth every morning. (1000)    [provider]  methimazole (TAPAZOLE) 5 MG tablet Take 1 tablet (5 mg total) by mouth daily. Patient taking differently: Take 5 mg by mouth daily at 6 (six) AM.  11/11/17   Renato Shin, MD  Multiple Vitamin (MULTIVITAMIN WITH MINERALS) TABS tablet Take 1 tablet by  mouth daily. (0800)    [provider]  ondansetron (ZOFRAN) 4 MG tablet Take 4 mg by mouth every 6 (six) hours as needed for nausea or vomiting.    [provider]  pantoprazole (PROTONIX) 40 MG tablet Take 40 mg by mouth daily at 6 (six) AM.     [provider]  polyethylene glycol (MIRALAX / GLYCOLAX) packet Take 17 g by mouth daily as needed (constipation.).     [provider]  predniSONE (DELTASONE) 2.5 MG tablet Take 2.5 mg by mouth every other day. (0800) Alternating days with 5 mg dose    [provider]  predniSONE (DELTASONE) 5 MG tablet Take 5 mg by mouth every other day. (0800) Alternating days with 2.5 mg dose    [provider]  promethazine (PHENERGAN) 25 MG tablet Take 25 mg by mouth every 6 (six) hours as needed for nausea or vomiting. Max 3 doses per 24 hours    [provider]  senna-docusate (SENOKOT-S) 8.6-50 MG tablet Take 1 tablet by mouth at bedtime. (2000)    [provider]  Skin Protectants, Misc. (MINERIN) CREA Apply 1 application topically 2 (two) times daily.     [provider]  Sodium Phosphates (RA SALINE ENEMA RE) Place 1 enema rectally daily as needed (constipation).    [provider]  solifenacin (VESICARE) 10 MG tablet Take 10 mg by mouth daily. (0800)    [provider]  spironolactone (ALDACTONE) 100 MG tablet Take 100 mg by mouth daily. (0800)    [provider]  TECFIDERA 240 MG CPDR Take 1 capsule (240 mg total) by mouth 2 (two) times daily. 04/20/19   Suzzanne Cloud, NP     Family History  Problem Relation Age of Onset  . Multiple sclerosis Other        neices.   . Cancer Mother   . Diabetes Mother   . GI Bleed Sister        diverticulitis  . Thyroid disease Neg Hx     Social History   Socioeconomic History  . Marital status: Widowed    Spouse name: Not on file  . Number of children: 2  . Years of education: 61yr colleg  . Highest  education level: Not on file  Occupational History  .  Occupation: N/A    Employer: [employer]    Comment: NIGHT RESIDENT [employer]  Tobacco Use  . Smoking status: Never Smoker  . Smokeless tobacco: Never Used  Substance and Sexual Activity  . Alcohol use: No  . Drug use: No  . Sexual activity: Never    Birth control/protection: Surgical  Other Topics Concern  . Not on file  Social History Narrative   Patient is widowed with 2 children   Patient is right handed   Patient has 2 yrs of college   Patient drinks 2-3 cups of tea daily   Social Determinants of Health   Financial Resource Strain:   . Difficulty of Paying Living Expenses:   Food Insecurity:   . Worried About Charity fundraiser in the Last Year:   . Arboriculturist in the Last Year:   Transportation Needs:   . Film/video editor (Medical):   Marland Kitchen Lack of Transportation (Non-Medical):   Physical Activity:   . Days of Exercise per Week:   . Minutes of Exercise per Session:   Stress:   . Feeling of Stress :   Social Connections:   . Frequency of Communication with Friends and Family:   . Frequency of Social Gatherings with Friends and Family:   . Attends Religious Services:   . Active Member of Clubs or Organizations:   . Attends Archivist Meetings:   Marland Kitchen Marital Status:      Review of Systems: A 12 point ROS discussed and pertinent positives are indicated in the HPI above.  All other systems are negative.  Review of Systems  Constitutional: Negative for chills and fever.  Respiratory: Negative for shortness of breath and wheezing.   Cardiovascular: Negative for chest pain and palpitations.  Gastrointestinal: Negative for abdominal pain.  Neurological: Negative for headaches.  Psychiatric/Behavioral: Negative for behavioral problems and confusion.    Vital Signs: Ht 5\' 2"  (1.575 m)   Wt 200 lb (90.7 kg)   BMI 36.58 kg/m   Physical Exam Vitals and nursing note reviewed.  Constitutional:        General: She is not in acute distress.    Appearance: Normal appearance.  Cardiovascular:     Rate and Rhythm: Normal rate and regular rhythm.     Heart sounds: Normal heart sounds. No murmur.  Pulmonary:     Effort: Pulmonary effort is normal. No respiratory distress.     Breath sounds: Normal breath sounds. No wheezing.  Skin:    General: Skin is warm and dry.  Neurological:     Mental Status: She is alert and oriented to person, place, and time.      MD Evaluation Airway: WNL Heart: WNL Abdomen: WNL Chest/ Lungs: WNL ASA  Classification: 3 Mallampati/Airway Score: Two   Imaging: No results found.  Labs:  CBC: Recent Labs    05/10/19 1252 06/18/19 0000 09/06/19 0852 10/28/19 0945  WBC 12.5* 11.8 11.0* 12.3*  HGB 13.0 12.0 12.9 13.7  HCT 41.3 37 41.0 42.3  PLT 327 285 346 337    COAGS: No results for input(s): INR, APTT in the last 8760 hours.  BMP: Recent Labs    05/10/19 1254 06/18/19 0000 07/30/19 0000 09/06/19 0940  NA 140 139 143 142  K 3.3* 3.3* 3.4 3.0*  CL 95* 93* 96* 93*  CO2 37* 35* 35* 39*  GLUCOSE 151*  --   --  104*  BUN 11 10 13 9   CALCIUM 9.1  9.0 9.5 9.0  CREATININE 0.62 0.6 0.6 0.54  GFRNONAA >60 88.93 86.96 >60  GFRAA >60 90 90 >60    LIVER FUNCTION TESTS: Recent Labs    12/18/18 0000 06/18/19 0000  AST 10* 8*  ALT 11 11  ALKPHOS  --  83  ALBUMIN  --  3.9     Assessment and Plan:  History of chronic urinary incontinence secondary to advanced multiple sclerosis (neurogenic bladder) s/p suprapubic catheter placement in IR 09/09/2017 by Dr. Pascal Lux; suprapubic catheter was inadvertently displaced. Plan for image-guided suprapubic catheter placement today in IR. Patient is NPO. Afebrile. She does not take blood thinners. INR pending.  Risks and benefits discussed with the patient including bleeding, infection, damage to adjacent structures, and sepsis. All of the patient's questions were answered, patient is  agreeable to proceed. Consent signed and in chart.   Thank you for this interesting consult.  I greatly enjoyed meeting Laura Roberts and look forward to participating in their care.  A copy of this report was sent to the requesting provider on this date.  Electronically Signed: Earley Abide, PA-C 10/28/2019, 10:08 AM   I spent a total of 40 Minutes in face to face in clinical consultation, greater than 50% of which was counseling/coordinating care for urinary incontinence/suprapubic catheter placement.

## 2019-10-28 NOTE — Procedures (Signed)
Pre procedural Dx: Incontinence Post procedural Dx: Same  Technically successful CT guided placed of a 12 Fr drainage catheter placement into the urinary bladder yielding clear urine.    EBL: Trace Complications: None immediate  Ronny Bacon, MD Pager #: [phone]

## 2019-10-28 NOTE — Progress Notes (Signed)
NURSING HOME LOCATION:  [facility] ROOM NUMBER:  [id number]  CODE STATUS:  DNR  PCP:  [name], MD  [place] [address]   This is a nursing facility follow up of chronic medical diagnoses.   Interim medical record and care since last [facility] visit was updated with review of diagnostic studies and change in clinical status since last visit were documented.  HPI: She is a permanent resident of facility with diagnoses of MS, cervical/ thoracic/lumbosacral spinal stenosis, polyneuropathy, paraplegia, neurogenic bladder, diabetes with neurologic complications, gout, and GERD. Because of the neurogenic bladder, she has a suprapubic catheter.  The balloon burst and the catheter came out.  To prevent fecal soiling and risk of UTIs and sepsis; the catheter was replaced today.   Labs performed at the hospital revealed potassium of 2.3; this was repleted.  The hypo kalemia is present despite maintenance dose of 100 mg of spironolactone daily.   Mild leukocytosis was present; white count has ranged from 11.8-12.3 with minimal left shift. Despite the chronic leukocytosis, she denies any infectious or constitutional symptoms.  She is on low-dose maintenance prednisone every day with alternating doses of 2.5 and 5 mg on alternate days..  She does describe "diarrhea" as watery stool "off and on".  She states it is becoming daily.  It began approximately week and a half ago.  She is on a nightly stool softener and also daily Linzess. After the CT-guided procedure and catheter replacement; she describes nausea. Fasting glucoses have been 78-99.  Nonfasting glucoses have ranged from 119 up to a high of 226.  Constitutional: No fever, significant weight change  Eyes: No redness, discharge, pain ENT/mouth: No nasal congestion,  purulent discharge, earache, sore throat  Cardiovascular: No chest pain, palpitations, paroxysmal nocturnal dyspnea  Respiratory: No  cough, sputum production, hemoptysis, significant snoring, apnea   Gastrointestinal: No heartburn, dysphagia, abdominal pain, nausea /vomiting, rectal bleeding, melena Dermatologic: No rash, pruritus, change in appearance of skin Psychiatric: No significant anxiety, depression, insomnia, anorexia Endocrine: No change in hair/skin/nails, excessive thirst, excessive hunger  Hematologic/lymphatic: No significant bruising, lymphadenopathy, abnormal bleeding  Physical exam:  Pertinent or positive findings: She appears younger than her stated age.  She is somewhat lethargic after the procedure.  Arcus senilis is present.  Breath sounds are decreased.  She has slight tachycardia.  Suprapubic catheter is present.  She has bilateral foot drop.  Dorsalis pedis pulses are stronger than posterior tibial pulses.  General appearance: Adequately nourished; no acute distress, increased work of breathing is present.   Lymphatic: No lymphadenopathy about the head, neck, axilla. Eyes: No conjunctival inflammation or lid edema is present. There is no scleral icterus. Ears:  External ear exam shows no significant lesions or deformities.   Nose:  External nasal examination shows no deformity or inflammation. Nasal mucosa are pink and moist without lesions, exudates Oral exam:  Lips and gums are healthy appearing. There is no oropharyngeal erythema or exudate. Neck:  No thyromegaly, masses, tenderness noted.    Heart:  No murmur, click, rub .  Lungs:  without wheezes, rhonchi, rales, rubs. Abdomen: Bowel sounds are normal. Abdomen is soft and nontender with no organomegaly, hernias, masses. GU: Deferred  Extremities:  No cyanosis, clubbing  Neurologic exam : Balance, Rhomberg, finger to nose testing obviously could not be completed due to clinical state Skin: Warm & dry w/o tenting. No significant lesions or rash.  See summary under each active problem in the  Problem List with associated updated therapeutic  plan

## 2019-10-28 NOTE — Assessment & Plan Note (Signed)
Unchanged

## 2019-10-28 NOTE — Progress Notes (Signed)
Pt returned from Radiology at 1245.   Called [facility] for return trip to [facility].  Patient Can be discharged at 1400.   IV Rocephin 1g completed and PO Potassium given per order (see MAR).  [facility] and provided report to nurse caring for [name].  Aware of procedure; meds given and pt. Status.   Emptied urine from catheter bag- 150cc of yellow- blood tinged urine  1430 called [facility] again to verify ETA for transport back to facility.  Dispatcher states team onscene.  1440 [facility] here for transport.   Updated team on status

## 2019-10-28 NOTE — Discharge Instructions (Signed)
Suprapubic Catheter Replacement  Suprapubic catheter replacement is a procedure to remove an old catheter and insert a new, clean catheter. A suprapubic catheter is a rubber tube that drains urine from the bladder into a collection bag outside the body. The catheter is inserted into the bladder through a small opening in the lower abdomen, near the center of the body, above the pubic bone (suprapubicarea). There is a tiny balloon filled with germ-free (sterile) water on the end of the catheter that is in the bladder. The balloon helps to keep the catheter in place. If you need to wear a catheter for a long period of time, you may be instructed to replace the catheter yourself. Usually, suprapubic catheters need to be replaced every 4-6 weeks, or as often as told by your health care provider. What are the risks? Generally, this is a safe procedure. However, problems may occur, including failure to get the catheter into the bladder. What happens before the procedure?  You may have an exam or testing, including a blood or urine sample.  Ask your health care provider what steps will be taken to help prevent infection. What happens during the procedure?  You will lie on your back.  The water from the balloon will be removed using a syringe.  The catheter will be slowly removed.  Lubricant will be applied to the end of the new catheter that will go into your bladder.  The new catheter will be inserted through the opening in your abdomen. Your health care provider will slide the catheter into your bladder.  Your health care provider will wait for some urine to start flowing through the catheter. When this happens, a syringe will be used to fill the balloon with sterile water.  A collection bag will be attached to the end of the catheter. The procedure may vary among health care providers and hospitals. What can I expect after procedure? After the procedure, it is common to have:  Some  discomfort around the opening in your abdomen. Follow these instructions at home: Caring for the skin around the catheter Use a clean washcloth and soapy water to clean the skin around your catheter every day. Pat the area dry with a clean towel.  Do not pull on the catheter.  Do not use ointment or lotion on this area unless told by your health care provider.  Check the skin around the catheter every day for signs of infection. Check for: ? Redness, swelling, or pain. ? Fluid or blood. ? Warmth. ? Pus or a bad smell. Caring for the catheter  Clean the catheter with soap and water as often as told by your health care provider.  Always make sure there are no twists or curls (kinks) in the catheter.  As soon as you are able to move, you may use a leg bag to collect the urine. ? Make sure that the tubing is straight and without kinks. ? Wrap an ace bandage gently over the tubing and around your leg to minimize the risk of the bag getting pulled out. Emptying the collection bag Empty the large collection bag every 8 hours. Empty the small collection bag when it is about ? full. To empty your large or small collection bag, take the following steps:  Always keep the bag below the level of the catheter. This keeps urine from flowing backward into the catheter.  Hold the bag over the toilet or another container. Turn the valve (spigot) at the bottom of   the bag to empty the urine. ? Do not touch the opening of the spigot. ? Do not let the opening touch the toilet or container.  Close the spigot tightly when the bag is empty. Cleaning the collection bag   Wash your hands with soap and water. If soap and water are not available, use hand sanitizer.  Disconnect the bag from the catheter and immediately attach a new bag to the catheter.  Empty the used bag completely.  Clean the used bag according to the manufacturer's instructions, or as told by your health care provider.  Let the bag  dry completely, and put it in a clean plastic bag before storing it.       General instructions   Always wash your hands before and after caring for your catheter and collection bag. Use soap and water. If soap and water are not available, use hand sanitizer.  Always make sure there are no leaks in the catheter or collection bag.  Drink enough fluid to keep your urine pale yellow.  If you were prescribed an antibiotic medicine, take it as told by your health care provider. Do not stop taking the antibiotic even if you start to feel better.  Do not take baths, swim, or use a hot tub.  Keep all follow-up visits as told by your health care provider. This is important. Contact a health care provider if:  You leak urine.  You have redness, swelling, or pain around your catheter opening.  You have fluid or blood coming from your catheter opening.  Your catheter opening feels warm to the touch.  You have pus or a bad smell coming from your catheter opening.  You have a fever or chills.  Your urine flow slows down.  Your urine becomes cloudy or smelly. Get help right away if:  Your catheter comes out.  You feel nauseous.  You have back pain.  You have difficulty changing your catheter.  You have blood in your urine.  You have no urine flow for 1 hour. Summary  Suprapubic catheter replacement is a procedure to remove an old catheter and insert a new, clean catheter.  Make sure that you understand how to care for your catheter, your collection bag, and the opening in your abdomen.  Always wash your hands before and after caring for your catheter and collection bag.  Contact a health care provider if you leak urine or have a fever or any signs of infection around the catheter opening, or if your urine becomes cloudy or smelly.  Get help right away if your catheter comes out or you have nausea, back pain, blood in your urine, no urine flow for 1 hour, or difficulty  changing your catheter. This information is not intended to replace advice given to you by your health care provider. Make sure you discuss any questions you have with your health care provider. Document Revised: 02/04/2018 Document Reviewed: 02/04/2018 Elsevier Patient Education  [place].  Moderate Conscious Sedation, Adult, Care After These instructions provide you with information about caring for yourself after your procedure. Your health care provider may also give you more specific instructions. Your treatment has been planned according to current medical practices, but problems sometimes occur. Call your health care provider if you have any problems or questions after your procedure. What can I expect after the procedure? After your procedure, it is common:  To feel sleepy for several hours.  To feel clumsy and have poor balance for  several hours.  To have poor judgment for several hours.  To vomit if you eat too soon. Follow these instructions at home: For at least 24 hours after the procedure:   Do not: ? Participate in activities where you could fall or become injured. ? Drive. ? Use heavy machinery. ? Drink alcohol. ? Take sleeping pills or medicines that cause drowsiness. ? Make important decisions or sign legal documents. ? Take care of children on your own.  Rest. Eating and drinking  Follow the diet recommended by your health care provider.  If you vomit: ? Drink water, juice, or soup when you can drink without vomiting. ? Make sure you have little or no nausea before eating solid foods. General instructions  Have a responsible adult stay with you until you are awake and alert.  Take over-the-counter and prescription medicines only as told by your health care provider.  If you smoke, do not smoke without supervision.  Keep all follow-up visits as told by your health care provider. This is important. Contact a health care provider if:  You  keep feeling nauseous or you keep vomiting.  You feel light-headed.  You develop a rash.  You have a fever. Get help right away if:  You have trouble breathing. This information is not intended to replace advice given to you by your health care provider. Make sure you discuss any questions you have with your health care provider. Document Revised: 05/30/2017 Document Reviewed: 10/07/2015 Elsevier Patient Education  2020 [employer].

## 2019-10-28 NOTE — Assessment & Plan Note (Addendum)
Potassium 2.3 at IR CT suprapubic cath placement visit.  Potassium repleted. Patient describes persistent diarrhea up to 2 times a day for the last week and a half.  This is in the context of Linzess and despite spironolactone 100 mg daily. [facility] NP will address the need for Linzess and stool softeners. C. difficile studies if diarrhea persists.

## 2019-10-28 NOTE — Assessment & Plan Note (Signed)
10/28/2019 platelet count 337,000

## 2019-10-28 NOTE — Assessment & Plan Note (Signed)
Clinically not depressed at this time despite her advanced, complicated comorbidities .She was communicative and interactive.

## 2019-10-28 NOTE — Progress Notes (Signed)
CRITICAL VALUE ALERT  Critical Value:  Potassium 2.3  Date & Time Notified: 10/28/2019 10:23  Provider Notified: Dr Pascal Lux  Orders Received/Actions taken: No orders given at this time

## 2019-11-02 LAB — BASIC METABOLIC PANEL
BUN: 9 (ref 4–21)
BUN: 9 (ref 4–21)
CO2: 35 — AB (ref 13–22)
Chloride: 91 — AB (ref 99–108)
Creatinine: 0.4 — AB (ref 0.5–1.1)
Creatinine: 0.4 — AB (ref 0.5–1.1)
Glucose: 150
Potassium: 2.6 — AB (ref 3.4–5.3)
Sodium: 142 (ref 137–147)

## 2019-11-02 LAB — COMPREHENSIVE METABOLIC PANEL
Calcium: 9.8 (ref 8.7–10.7)
Calcium: 9.8 (ref 8.7–10.7)
GFR calc Af Amer: 90
GFR calc non Af Amer: 90

## 2019-11-20 LAB — BASIC METABOLIC PANEL
BUN: 6 (ref 4–21)
BUN: 6 (ref 4–21)
CO2: 29 — AB (ref 13–22)
CO2: 29 — AB (ref 13–22)
Chloride: 99 (ref 99–108)
Chloride: 99 (ref 99–108)
Creatinine: 0.6 (ref 0.5–1.1)
Creatinine: 0.6 (ref 0.5–1.1)
Glucose: 127
Glucose: 127
Potassium: 4.8 (ref 3.4–5.3)
Potassium: 4.8 (ref 3.4–5.3)
Sodium: 144 (ref 137–147)
Sodium: 144 (ref 137–147)

## 2019-11-20 LAB — COMPREHENSIVE METABOLIC PANEL
Calcium: 10.2 (ref 8.7–10.7)
Calcium: 10.2 (ref 8.7–10.7)
GFR calc Af Amer: 90
GFR calc Af Amer: 90
GFR calc non Af Amer: 89.68
GFR calc non Af Amer: 89.68

## 2019-11-25 ENCOUNTER — Other Ambulatory Visit (HOSPITAL_COMMUNITY): Payer: Self-pay | Admitting: Physician Assistant

## 2019-11-25 ENCOUNTER — Ambulatory Visit (HOSPITAL_COMMUNITY)
Admission: RE | Admit: 2019-11-25 | Discharge: 2019-11-25 | Disposition: A | Discharge status: Home / Self Care | Payer: Medicare Other | Source: Ambulatory Visit | Attending: Physician Assistant | Admitting: Physician Assistant

## 2019-11-25 DIAGNOSIS — Z9359 Other cystostomy status: Secondary | ICD-10-CM

## 2019-11-25 DIAGNOSIS — Z435 Encounter for attention to cystostomy: Secondary | ICD-10-CM | POA: Insufficient documentation

## 2019-11-25 DIAGNOSIS — R32 Unspecified urinary incontinence: Secondary | ICD-10-CM | POA: Diagnosis not present

## 2019-11-25 HISTORY — PX: IR CATHETER TUBE CHANGE: IMG717

## 2019-11-25 MED ORDER — LIDOCAINE VISCOUS HCL 2 % MT SOLN
OROMUCOSAL | Status: AC
  Filled 2019-11-25: qty 15

## 2019-11-25 MED ORDER — IOHEXOL 300 MG/ML  SOLN
50.0000 mL | Freq: Once | INTRAMUSCULAR | Status: AC | PRN
  Administered 2019-11-25: 8 mL

## 2019-11-25 MED ORDER — LIDOCAINE HCL 1 % IJ SOLN
INTRAMUSCULAR | Status: AC
  Filled 2019-11-25: qty 20

## 2019-11-25 NOTE — Procedures (Signed)
Pre procedural Dx: Incontinence Post procedural Dx: Same  Technically successful fluoro guided exchange and up-sizing of now 14 Fr suprapubic catheter.   EBL: Trace Complications: None immediate  Ronny Bacon, MD Pager #: [phone]

## 2019-12-01 LAB — BASIC METABOLIC PANEL
BUN: 12 (ref 4–21)
CO2: 31 — AB (ref 13–22)
Chloride: 96 — AB (ref 99–108)
Creatinine: 0.6 (ref 0.5–1.1)
Glucose: 131
Potassium: 4.2 (ref 3.4–5.3)
Sodium: 143 (ref 137–147)

## 2019-12-01 LAB — COMPREHENSIVE METABOLIC PANEL
Calcium: 9.5 (ref 8.7–10.7)
GFR calc Af Amer: 90
GFR calc non Af Amer: 86.31

## 2019-12-07 ENCOUNTER — Ambulatory Visit (INDEPENDENT_AMBULATORY_CARE_PROVIDER_SITE_OTHER): Payer: Medicare Other | Admitting: Neurology

## 2019-12-07 ENCOUNTER — Encounter: Payer: Self-pay | Admitting: Neurology

## 2019-12-07 ENCOUNTER — Other Ambulatory Visit: Payer: Self-pay

## 2019-12-07 ENCOUNTER — Non-Acute Institutional Stay: Payer: Medicare Other | Admitting: Hospice

## 2019-12-07 VITALS — BP 133/79 | HR 90 | Ht 62.0 in | Wt 186.0 lb

## 2019-12-07 DIAGNOSIS — N319 Neuromuscular dysfunction of bladder, unspecified: Secondary | ICD-10-CM | POA: Diagnosis not present

## 2019-12-07 DIAGNOSIS — G35 Multiple sclerosis: Secondary | ICD-10-CM | POA: Diagnosis not present

## 2019-12-07 DIAGNOSIS — Z515 Encounter for palliative care: Secondary | ICD-10-CM

## 2019-12-07 MED ORDER — TECFIDERA 240 MG PO CPDR: 240.0000 mg | DELAYED_RELEASE_CAPSULE | Freq: Two times a day (BID) | ORAL | 11 refills | Status: DC

## 2019-12-07 NOTE — Patient Instructions (Signed)
Continue current medications  Check blood work today  See you back in 8 months

## 2019-12-07 NOTE — Progress Notes (Signed)
I have read the note, and I agree with the clinical assessment and plan.  Domonick Sittner K Sybol Morre   

## 2019-12-07 NOTE — Progress Notes (Signed)
[facility] Consult Note Telephone: [phone]  Fax: [phone] Fax: [phone]  PATIENT NAME: Laura Roberts DOB: 1940/07/03 MRN: [id number]  PRIMARY CARE PROVIDER:   [name], MD  REFERRING PROVIDER:  Hendricks Roberts, [place],  [place] [address]  RESPONSIBLE PARTY:   Self Extended Emergency Contact Information Primary Emergency Contact: Laura Roberts [place], [place] Phone: [phone] Relation: Son Secondary Emergency Contact: Laura Roberts [place] Phone: [phone] Mobile Phone: [phone] Relation: Son   RECOMMENDATIONS/PLAN:   Advance Care Planning/Goals of Care:  Visit consisted of building trust and discussions on Palliative Medicine as specialized medical care for people living with serious illness, aimed at facilitating better quality of life through symptoms relief, assisting with advance care plan and establishing goals of care. Patient affirmed she is a DNR. She explained she took the decision to save her children the ordeal of taking the decision. DNR in [place]. Goals of care include to maximize quality of life and symptom management.   Today, visit consisted of counseling and education dealing with the complex and emotionally intense issues of symptom management and palliative care in the setting of serious and potentially life-threatening illness.  MOST form discussions today.  Patient reiterated her desire to make advance care planning decisions and not put the burden on someone else.  Most selections today include DO NOT RESUSCITATE, limited additional interventions, IV fluids as indicated, antibiotics as indicated, no feeding tube.  MOST form put in facility records and same uploaded to epic.  She also said that her children are aware of her advance care plan choices and are supportive.Palliative medicine will  continue to support patient, patient's family and the medical team.  Symptom management:  Patient reported she saw her Neurologist today related to multiple sclerosis and she said patient is doing well fairly stable.  She remains on Tecfidera; no changes to plan of care. suprapubic tube replaced 09/06/2019 with repeat botox   injection related to neurogenic bladder and MS. F/u with [facility] 01/06/2020.  Patient denied spasm at this time; spasm well managed with neurontin and baclofen. Type 2 DM managed with Trulicity, last and Lantus. Patient is bedbound, hoyer lift for transfers, able to feed self after set up.  She denies new numbness or weakness or changes to the bowels or bladder.  She denies moodiness, continues on Cymbalta for depression;  she participates in [place] and other facility activities. She loves to color and has some colored works on display in her room. Nursing staff with no concerns. Encouraged ongoing care.  Follow up: Palliative care will continue to follow patient for goals of care clarification and symptom management. I spent  1 hour and 16 minutes providing this consultation; time includes chart review and documentation. More than 50% of the time in this consultation was spent on coordinating communication  HISTORY OF PRESENT ILLNESS:  HISTORY OF PRESENT ILLNESS:Laura M Smithis a 79 y.o.year oldfemalewith multiple medical problems including MS, paraplegia, depression, h/o fracture, GED, DM2, HTN, neurogenic pain of LE, suprapubic urinary catheter. Palliative Care was asked to help address goals of care.   CODE STATUS: DNR  PPS: 30% HOSPICE ELIGIBILITY/DIAGNOSIS: TBD  PAST MEDICAL HISTORY:  Past Medical History:  Diagnosis Date  . Age related osteoporosis   . Aphasia   . Bronchitis    hx of   . Cellulitis and abscess of toe    hx of   .  Constipation   . Cystostomy in place [facility])   . Degenerative arthritis    osteoarthritis   . Depression   . Diabetes  mellitus without complication ([place])    type 2   . Edema    localized   . Full incontinence of feces   . Gait disturbance   . GERD (gastroesophageal reflux disease)   . Gout    hx of   . History of falling   . Hyperlipidemia   . Hypertension   . Hyperthyroidism   . Multiple sclerosis ([facility])   . Neuromuscular disorder (H[facility])    Bilateral hand carpal tunnel syndrome  . Neuromuscular dysfunction of bladder   . Nontoxic multinodular goiter   . Obesity   . Osteoporosis   . Other adrenocortical insufficiency ([place])   . Paraplegia (H[facility])    LEGS  . Personal history of urinary (tract) infections   . Polyneuropathy   . Pressure ulcer of right buttock, stage 4 ([place])   . Spinal stenosis in cervical region   . Spinal stenosis of lumbosacral region   . Spinal stenosis, thoracic   . Tinea unguium   . Vitamin D deficiency     SOCIAL HX:  Social History   Tobacco Use  . Smoking status: Never Smoker  . Smokeless tobacco: Never Used  Substance Use Topics  . Alcohol use: No    ALLERGIES:  Allergies  Allergen Reactions  . Codeine Other (See Comments)    Difficult breathing and skin problem  . Ultram [Tramadol] Other (See Comments)    Difficult breathing and skin peeling  . Januvia [Sitagliptin] Rash and Other (See Comments)    Blisters     PERTINENT MEDICATIONS:  Outpatient Encounter Medications as of 12/07/2019  Medication Sig  . acetaminophen (TYLENOL) 325 MG tablet Take 650 mg by mouth every 6 (six) hours as needed for mild pain or fever.   . baclofen (LIORESAL) 10 MG tablet Take 10-20 mg by mouth See admin instructions. (0800 & 1200) TAKE  1.5 tabs (15 mg)  BY MOUTH ONCE DAILY IN THE MORNING & TAKE 1 TABLET (10 MG) BY MOUTH AT NOON  . baclofen (LIORESAL) 20 MG tablet Take 20 mg by mouth at bedtime.   . barrier cream (NON-SPECIFIED) CREA Apply 1 application topically 2 (two) times daily. Apply to right ischium and bilateral buttocks  . bisacodyl (DULCOLAX) 10 MG suppository Place  10 mg rectally as needed for moderate constipation.  . calcium citrate (CALCITRATE - DOSED IN MG ELEMENTAL CALCIUM) 950 MG tablet Take 200 mg of elemental calcium by mouth daily. (0800)  . Carboxymeth-Glycerin-Polysorb (REFRESH OPTIVE ADVANCED) 0.5-1-0.5 % SOLN Apply 1 drop to eye 4 (four) times daily.   . cholecalciferol (VITAMIN D) 1000 UNITS tablet Take 1,000 Units by mouth daily. (0800)  . cycloSPORINE (RESTASIS) 0.05 % ophthalmic emulsion Place 1 drop into both eyes 2 (two) times daily.   Marland Kitchen Dextran 70-Hypromellose (GENTEAL TEARS) 0.1-0.3 % SOLN Place 1 drop into both eyes 4 (four) times daily as needed (DRY/IRRITATED EYES.).   Marland Kitchen Dulaglutide (TRULICITY) 1.5 IO/2.7OJ SOPN Inject 1.5 mg into the skin once a week. (1300)  . DULoxetine (CYMBALTA) 30 MG capsule Take 30 mg by mouth daily. (0800) Give along with 60 mg to = 90 mg  . DULoxetine (CYMBALTA) 60 MG capsule Take 60 mg by mouth daily. (0800)Give along with 30 mg to = 90 mg  . furosemide (LASIX) 40 MG tablet Take 40 mg by mouth daily. (0800)  .  gabapentin (NEURONTIN) 400 MG capsule Take 400 mg by mouth 3 (three) times daily. (0800, 1400, & 2000)  . guaiFENesin (MUCINEX) 600 MG 12 hr tablet Take 1,200 mg by mouth at bedtime. (2000)  . Insulin Glargine (LANTUS SOLOSTAR) 100 UNIT/ML Solostar Pen Inject 25 Units into the skin at bedtime.   . insulin lispro (HUMALOG) 100 UNIT/ML injection Inject 7-12 Units into the skin See admin instructions. CHECK FSBS BEFORE EACH MEAL IF CBG IS 200-300=7U, 301-400=12U, GREATER THAN 400 CALL MD (PRIME PEN WITH 2 UNITS PRIOR TO EACH USE - PEN EXP  . ipratropium-albuterol (DUONEB) 0.5-2.5 (3) MG/3ML SOLN Take 3 mLs by nebulization every 6 (six) hours as needed (wheezing/coughing).   Marland Kitchen linaclotide (LINZESS) 72 MCG capsule Take 72 mcg by mouth daily before breakfast. (0800)  . magnesium hydroxide (MILK OF MAGNESIA) 400 MG/5ML suspension Take 30 mLs by mouth daily as needed for mild constipation.  . magnesium oxide  (MAG-OX) 400 MG tablet Take 400 mg by mouth every morning. (1000)  . methimazole (TAPAZOLE) 5 MG tablet Take 1 tablet (5 mg total) by mouth daily.  . Multiple Vitamin (MULTIVITAMIN WITH MINERALS) TABS tablet Take 1 tablet by mouth daily. (0800)  . ondansetron (ZOFRAN) 4 MG tablet Take 4 mg by mouth every 6 (six) hours as needed for nausea or vomiting.  . pantoprazole (PROTONIX) 40 MG tablet Take 40 mg by mouth daily at 6 (six) AM.   . polyethylene glycol (MIRALAX / GLYCOLAX) packet Take 17 g by mouth daily as needed (constipation.).   Marland Kitchen predniSONE (DELTASONE) 2.5 MG tablet Take 2.5 mg by mouth every other day. (0800) Alternating days with 5 mg dose  . predniSONE (DELTASONE) 5 MG tablet Take 5 mg by mouth every other day. (0800) Alternating days with 2.5 mg dose  . promethazine (PHENERGAN) 25 MG tablet Take 25 mg by mouth every 6 (six) hours as needed for nausea or vomiting. Max 3 doses per 24 hours  . senna-docusate (SENOKOT-S) 8.6-50 MG tablet Take 1 tablet by mouth at bedtime. (2000)  . Skin Protectants, Misc. (MINERIN) CREA Apply 1 application topically 2 (two) times daily.   . Sodium Phosphates (RA SALINE ENEMA RE) Place 1 enema rectally daily as needed (constipation).  . solifenacin (VESICARE) 10 MG tablet Take 10 mg by mouth daily. (0800)  . spironolactone (ALDACTONE) 100 MG tablet Take 100 mg by mouth daily. (0800)  . TECFIDERA 240 MG CPDR Take 1 capsule (240 mg total) by mouth 2 (two) times daily.   No facility-administered encounter medications on file as of 12/07/2019.    Physical Exam/ROS General:pleasant, cooperative Cardiovascular: regular rate and rhythm; denies chest pain Pulmonary:lung sounds clear, normal respiratory effort 95% RA; no adventitious sounds auscultated Abdomen: soft,nontender, + bowel sounds, bms WNL RX:[id number] catheter in place with clear yellow urine noted in bag Extremities:No edema, MS induced joint deformities not walked in >3 years.  Skin: no  rashes, no wounds to exposed skin Neurological: Weakness, alert and oriented x 3  Teodoro Spray, NP

## 2019-12-07 NOTE — Progress Notes (Signed)
PATIENT: Laura Roberts DOB: [birth year]  REASON FOR VISIT: follow up HISTORY FROM: patient  HISTORY OF PRESENT ILLNESS: Today 12/07/19  Laura Roberts is a 79 year old female with history of multiple sclerosis associated with paraplegia.  She resides at [facility] rehab center.  She is on Tecfidera.  She has neurogenic bladder with suprapubic catheter.  She is nonambulatory, uses electric wheelchair.  She is not able to use her legs.  Has had some recent bladder issues, her catheter balloon burst, has a temporary tube in place.  Continues follow-up with urology in July.  Continues with restorative therapy 5 days a week.  Feels MS is overall stable.  She remains on baclofen. She had been missing her morning dose due to stocking issue.  She presents today for evaluation unaccompanied.  HISTORY  06/16/2019 SS: Laura Roberts is a 79 year old female with history of multiple sclerosis associated with paraplegia.  She is currently residing at [facility] rehab center.  She remains on Tecfidera.  She has a neurogenic bladder with a suprapubic catheter.  She had MRI of the brain and cervical spine in September 2018, findings are relatively stable compared to 2015.  She operates an Clinical research associate.  She is not ambulatory, she is not able to use her legs.  She remains involved in restorative therapy, 5 days a week.  She recently had Botox injections in her bladder, that has worked well in the past.  She indicates her MS has remained stable. She denies any new numbness or weakness, changes to the bowels or bladder, or changes to vision.  She currently does not have any skin wounds.  She remains on baclofen for spasticity.  She reports she has remained well.  She presents today for evaluation via virtual visit, difficulty getting the virtual visit at the facility.   REVIEW OF SYSTEMS: Out of a complete 14 system review of symptoms, the patient complains only of the following symptoms, and all other reviewed systems  are negative.  spasms  ALLERGIES: Allergies  Allergen Reactions  . Codeine Other (See Comments)    Difficult breathing and skin problem  . Ultram [Tramadol] Other (See Comments)    Difficult breathing and skin peeling  . Januvia [Sitagliptin] Rash and Other (See Comments)    Blisters    HOME MEDICATIONS: Outpatient Medications Prior to Visit  Medication Sig Dispense Refill  . acetaminophen (TYLENOL) 325 MG tablet Take 650 mg by mouth every 6 (six) hours as needed for mild pain or fever.     . baclofen (LIORESAL) 10 MG tablet Take 10-20 mg by mouth See admin instructions. (0800 & 1200) TAKE  1.5 tabs (15 mg)  BY MOUTH ONCE DAILY IN THE MORNING & TAKE 1 TABLET (10 MG) BY MOUTH AT NOON    . baclofen (LIORESAL) 20 MG tablet Take 20 mg by mouth at bedtime.     . barrier cream (NON-SPECIFIED) CREA Apply 1 application topically 2 (two) times daily. Apply to right ischium and bilateral buttocks    . bisacodyl (DULCOLAX) 10 MG suppository Place 10 mg rectally as needed for moderate constipation.    . calcium citrate (CALCITRATE - DOSED IN MG ELEMENTAL CALCIUM) 950 MG tablet Take 200 mg of elemental calcium by mouth daily. (0800)    . Carboxymeth-Glycerin-Polysorb (REFRESH OPTIVE ADVANCED) 0.5-1-0.5 % SOLN Apply 1 drop to eye 4 (four) times daily.     . cholecalciferol (VITAMIN D) 1000 UNITS tablet Take 1,000 Units by mouth daily. (0800)    .  cycloSPORINE (RESTASIS) 0.05 % ophthalmic emulsion Place 1 drop into both eyes 2 (two) times daily.     Marland Kitchen Dextran 70-Hypromellose (GENTEAL TEARS) 0.1-0.3 % SOLN Place 1 drop into both eyes 4 (four) times daily as needed (DRY/IRRITATED EYES.).     Marland Kitchen Dulaglutide (TRULICITY) 1.5 IE/3.3IR SOPN Inject 1.5 mg into the skin once a week. (1300)    . DULoxetine (CYMBALTA) 30 MG capsule Take 30 mg by mouth daily. (0800) Give along with 60 mg to = 90 mg    . DULoxetine (CYMBALTA) 60 MG capsule Take 60 mg by mouth daily. (0800)Give along with 30 mg to = 90 mg    .  furosemide (LASIX) 40 MG tablet Take 40 mg by mouth daily. (0800)    . gabapentin (NEURONTIN) 400 MG capsule Take 400 mg by mouth 3 (three) times daily. (0800, 1400, & 2000)    . guaiFENesin (MUCINEX) 600 MG 12 hr tablet Take 1,200 mg by mouth at bedtime. (2000)    . Insulin Glargine (LANTUS SOLOSTAR) 100 UNIT/ML Solostar Pen Inject 25 Units into the skin at bedtime.     . insulin lispro (HUMALOG) 100 UNIT/ML injection Inject 7-12 Units into the skin See admin instructions. CHECK FSBS BEFORE EACH MEAL IF CBG IS 200-300=7U, 301-400=12U, GREATER THAN 400 CALL MD (PRIME PEN WITH 2 UNITS PRIOR TO EACH USE - PEN EXP    . ipratropium-albuterol (DUONEB) 0.5-2.5 (3) MG/3ML SOLN Take 3 mLs by nebulization every 6 (six) hours as needed (wheezing/coughing).     Marland Kitchen linaclotide (LINZESS) 72 MCG capsule Take 72 mcg by mouth daily before breakfast. (0800)    . magnesium hydroxide (MILK OF MAGNESIA) 400 MG/5ML suspension Take 30 mLs by mouth daily as needed for mild constipation.    . magnesium oxide (MAG-OX) 400 MG tablet Take 400 mg by mouth every morning. (1000)    . methimazole (TAPAZOLE) 5 MG tablet Take 1 tablet (5 mg total) by mouth daily. 30 tablet 5  . Multiple Vitamin (MULTIVITAMIN WITH MINERALS) TABS tablet Take 1 tablet by mouth daily. (0800)    . ondansetron (ZOFRAN) 4 MG tablet Take 4 mg by mouth every 6 (six) hours as needed for nausea or vomiting.    . pantoprazole (PROTONIX) 40 MG tablet Take 40 mg by mouth daily at 6 (six) AM.     . polyethylene glycol (MIRALAX / GLYCOLAX) packet Take 17 g by mouth daily as needed (constipation.).     Marland Kitchen predniSONE (DELTASONE) 2.5 MG tablet Take 2.5 mg by mouth every other day. (0800) Alternating days with 5 mg dose    . predniSONE (DELTASONE) 5 MG tablet Take 5 mg by mouth every other day. (0800) Alternating days with 2.5 mg dose    . promethazine (PHENERGAN) 25 MG tablet Take 25 mg by mouth every 6 (six) hours as needed for nausea or vomiting. Max 3 doses per 24  hours    . senna-docusate (SENOKOT-S) 8.6-50 MG tablet Take 1 tablet by mouth at bedtime. (2000)    . Skin Protectants, Misc. (MINERIN) CREA Apply 1 application topically 2 (two) times daily.     . Sodium Phosphates (RA SALINE ENEMA RE) Place 1 enema rectally daily as needed (constipation).    . solifenacin (VESICARE) 10 MG tablet Take 10 mg by mouth daily. (0800)    . spironolactone (ALDACTONE) 100 MG tablet Take 100 mg by mouth daily. (0800)    . TECFIDERA 240 MG CPDR Take 1 capsule (240 mg total) by mouth 2 (  two) times daily. 60 capsule 11   No facility-administered medications prior to visit.    PAST MEDICAL HISTORY: Past Medical History:  Diagnosis Date  . Age related osteoporosis   . Aphasia   . Bronchitis    hx of   . Cellulitis and abscess of toe    hx of   . Constipation   . Cystostomy in place [facility])   . Degenerative arthritis    osteoarthritis   . Depression   . Diabetes mellitus without complication ([place])    type 2   . Edema    localized   . Full incontinence of feces   . Gait disturbance   . GERD (gastroesophageal reflux disease)   . Gout    hx of   . History of falling   . Hyperlipidemia   . Hypertension   . Hyperthyroidism   . Multiple sclerosis ([place])   . Neuromuscular disorder (HCC)    Bilateral hand carpal tunnel syndrome  . Neuromuscular dysfunction of bladder   . Nontoxic multinodular goiter   . Obesity   . Osteoporosis   . Other adrenocortical insufficiency ([place])   . Paraplegia (HCC)    LEGS  . Personal history of urinary (tract) infections   . Polyneuropathy   . Pressure ulcer of right buttock, stage 4 ([place])   . Spinal stenosis in cervical region   . Spinal stenosis of lumbosacral region   . Spinal stenosis, thoracic   . Tinea unguium   . Vitamin D deficiency     PAST SURGICAL HISTORY: Past Surgical History:  Procedure Laterality Date  . ABDOMINAL HYSTERECTOMY    . BACK SURGERY    . BOTOX INJECTION N/A 01/30/2018   Procedure:  CYSTOSCOPY BOTOX INJECTION, SUPRAPUBIC EXCHANGE;  Surgeon: Lucas Mallow, MD;  Location: [facility];  Service: Urology;  Laterality: N/A;  . BOTOX INJECTION N/A 09/02/2018   Procedure: BOTOX INJECTION;  Surgeon: Lucas Mallow, MD;  Location: [facility];  Service: Urology;  Laterality: N/A;  . BOTOX INJECTION N/A 05/10/2019   Procedure: CYSTOSCOPY BOTOX INJECTION, SUPRAPUBIC EXCHANGE;  Surgeon: Lucas Mallow, MD;  Location: [facility];  Service: Urology;  Laterality: N/A;  . BOTOX INJECTION N/A 09/06/2019   Procedure: CYSTOSCOPY BOTOX INJECTION;  Surgeon: Lucas Mallow, MD;  Location: [facility];  Service: Urology;  Laterality: N/A;  . CATARACT EXTRACTION Right   . CHOLECYSTECTOMY    . CYSTOSCOPY N/A 09/02/2018   Procedure: CYSTOSCOPY;  Surgeon: Lucas Mallow, MD;  Location: [facility];  Service: Urology;  Laterality: N/A;  . CYSTOSTOMY N/A 09/02/2018   Procedure: CYSTOSTOMY SUPRAPUBIC;  Surgeon: Lucas Mallow, MD;  Location: [facility];  Service: Urology;  Laterality: N/A;  96 MINS  . FEMUR IM NAIL Right 06/02/2014   Procedure: INTRAMEDULLARY (IM) RETROGRADE FEMORAL NAILING;  Surgeon: Johnny Bridge, MD;  Location: [place];  Service: Orthopedics;  Laterality: Right;  . GROIN DISSECTION Left 10/24/2017   Procedure: IRRIGATION AND DEBRIDEMENT, LEFT THIGH ABCESS ;  Surgeon: Alphonsa Overall, MD;  Location: [facility];  Service: General;  Laterality: Left;  . INSERTION OF SUPRAPUBIC CATHETER N/A 09/06/2019   Procedure: SUPRAPUBIC TUBE CHANGE;  Surgeon: Lucas Mallow, MD;  Location: [facility];  Service: Urology;  Laterality: N/A;  . IR CATHETER TUBE CHANGE  10/13/2017  . IR CATHETER TUBE CHANGE  07/15/2018  . IR CATHETER TUBE CHANGE  08/26/2019  . IR CATHETER TUBE CHANGE  11/25/2019  .  left hand carpal tunnel surgery    . REPLACEMENT TOTAL KNEE BILATERAL  2004    FAMILY HISTORY: Family History  Problem Relation Age of Onset  . Multiple sclerosis Other        neices.   . Cancer Mother   . Diabetes Mother     . GI Bleed Sister        diverticulitis  . Thyroid disease Neg Hx     SOCIAL HISTORY: Social History   Socioeconomic History  . Marital status: Widowed    Spouse name: Not on file  . Number of children: 2  . Years of education: 49yr colleg  . Highest education level: Not on file  Occupational History  . Occupation: N/A    Employer: [employer]    Comment: NIGHT RESIDENT [employer]  Tobacco Use  . Smoking status: Never Smoker  . Smokeless tobacco: Never Used  Substance and Sexual Activity  . Alcohol use: No  . Drug use: No  . Sexual activity: Never    Birth control/protection: Surgical  Other Topics Concern  . Not on file  Social History Narrative   Patient is widowed with 2 children   Patient is right handed   Patient has 2 yrs of college   Patient drinks 2-3 cups of tea daily   Social Determinants of Health   Financial Resource Strain:   . Difficulty of Paying Living Expenses:   Food Insecurity:   . Worried About Charity fundraiser in the Last Year:   . Arboriculturist in the Last Year:   Transportation Needs:   . Film/video editor (Medical):   Marland Kitchen Lack of Transportation (Non-Medical):   Physical Activity:   . Days of Exercise per Week:   . Minutes of Exercise per Session:   Stress:   . Feeling of Stress :   Social Connections:   . Frequency of Communication with Friends and Family:   . Frequency of Social Gatherings with Friends and Family:   . Attends Religious Services:   . Active Member of Clubs or Organizations:   . Attends Archivist Meetings:   Marland Kitchen Marital Status:   Intimate Partner Violence:   . Fear of Current or Ex-Partner:   . Emotionally Abused:   Marland Kitchen Physically Abused:   . Sexually Abused:    PHYSICAL EXAM  Vitals:   12/07/19 1024  BP: 133/79  Pulse: 90  Weight: 186 lb (84.4 kg)  Height: 5\' 2"  (1.575 m)   Body mass index is 34.02 kg/m.  Generalized: Well developed, in no acute distress  Neurological examination   Mentation: Alert oriented to time, place, history taking. Follows all commands speech and language fluent Cranial nerve II-XII: Pupils were equal round reactive to light. Extraocular movements were full, visual field were full on confrontational test. Facial sensation and strength were normal. Head turning and shoulder shrug were normal and symmetric. Motor: Good strength of right upper extremity, 3/5 left upper extremity difficulty holding the left arm elevated, no movement of lower extremities Sensory: No sensation to bilateral lower extremities Coordination: Cerebellar testing reveals good finger-nose-finger on the right, cannot perform well on the left, she has no use of the legs  Gait and station: Nonambulatory, in transport chair.  Reflexes: Deep tendon reflexes are symmetric but depressed throughout  DIAGNOSTIC DATA (LABS, IMAGING, TESTING) - I reviewed patient records, labs, notes, testing and imaging myself where available.  Lab Results  Component Value Date   WBC  12.3 (H) 10/28/2019   HGB 13.7 10/28/2019   HCT 42.3 10/28/2019   MCV 85.8 10/28/2019   PLT 337 10/28/2019      Component Value Date/Time   NA 143 10/28/2019 0945   NA 143 07/30/2019 0000   K 2.3 (LL) 10/28/2019 0945   CL 86 (L) 10/28/2019 0945   CO2 42 (H) 10/28/2019 0945   GLUCOSE 78 10/28/2019 0945   BUN 9 10/28/2019 0945   BUN 13 07/30/2019 0000   CREATININE 0.51 10/28/2019 0945   CALCIUM 9.3 10/28/2019 0945   PROT 6.8 05/19/2018 1028   ALBUMIN 3.9 06/18/2019 0000   ALBUMIN 4.3 05/19/2018 1028   AST 8 (A) 06/18/2019 0000   ALT 11 06/18/2019 0000   ALKPHOS 83 06/18/2019 0000   BILITOT 0.3 05/19/2018 1028   GFRNONAA >60 10/28/2019 0945   GFRAA >60 10/28/2019 0945   Lab Results  Component Value Date   CHOL 156 07/30/2019   HDL 56 07/30/2019   LDLCALC 85 07/30/2019   TRIG 77 07/30/2019   Lab Results  Component Value Date   HGBA1C 7.1 07/30/2019   No results found for: VITAMINB12 Lab Results   Component Value Date   TSH 1.10 07/30/2019      ASSESSMENT AND PLAN 79 y.o. year old female  has a past medical history of Age related osteoporosis, Aphasia, Bronchitis, Cellulitis and abscess of toe, Constipation, Cystostomy in place [facility]), Degenerative arthritis, Depression, Diabetes mellitus without complication ([place]), Edema, Full incontinence of feces, Gait disturbance, GERD (gastroesophageal reflux disease), Gout, History of falling, Hyperlipidemia, Hypertension, Hyperthyroidism, Multiple sclerosis ([place]), Neuromuscular disorder ([place]), Neuromuscular dysfunction of bladder, Nontoxic multinodular goiter, Obesity, Osteoporosis, Other adrenocortical insufficiency ([place]), Paraplegia ([place]), Personal history of urinary (tract) infections, Polyneuropathy, Pressure ulcer of right buttock, stage 4 ([place]), Spinal stenosis in cervical region, Spinal stenosis of lumbosacral region, Spinal stenosis, thoracic, Tinea unguium, and Vitamin D deficiency. here with:  1.  Multiple sclerosis 2.  Neurogenic bladder 3.  Paraparesis  Overall, her MS condition appears stable.  She continues to reside at [facility] rehab facility.  She will remain on Tecfidera for now.  Blood work will be done.  She continues to work with urology, has a temporary catheter in place.  She will follow-up in 8 months or sooner if needed, will let her see Dr. Jannifer Franklin to ensure continued benefit of Tecfidera.  I spent 30 minutes of face-to-face and non-face-to-face time with patient.  This included previsit chart review, lab review, study review, order entry, electronic health record documentation, patient education.  Butler Denmark, AGNP-C, DNP 12/07/2019, 11:04 AM [facility] [address], [place], [place] [address] [phone]

## 2019-12-08 LAB — CBC WITH DIFFERENTIAL/PLATELET
Basophils Absolute: 0 10*3/uL (ref 0.0–0.2)
Basos: 0 %
EOS (ABSOLUTE): 0.3 10*3/uL (ref 0.0–0.4)
Eos: 2 %
Hematocrit: 39.7 % (ref 34.0–46.6)
Hemoglobin: 12.9 g/dL (ref 11.1–15.9)
Immature Grans (Abs): 0 10*3/uL (ref 0.0–0.1)
Immature Granulocytes: 0 %
Lymphocytes Absolute: 1.7 10*3/uL (ref 0.7–3.1)
Lymphs: 12 %
MCH: 28.1 pg (ref 26.6–33.0)
MCHC: 32.5 g/dL (ref 31.5–35.7)
MCV: 87 fL (ref 79–97)
Monocytes Absolute: 0.6 10*3/uL (ref 0.1–0.9)
Monocytes: 4 %
Neutrophils Absolute: 11.3 10*3/uL — ABNORMAL HIGH (ref 1.4–7.0)
Neutrophils: 82 %
Platelets: 369 10*3/uL (ref 150–450)
RBC: 4.59 x10E6/uL (ref 3.77–5.28)
RDW: 15.9 % — ABNORMAL HIGH (ref 11.7–15.4)
WBC: 14.1 10*3/uL — ABNORMAL HIGH (ref 3.4–10.8)

## 2019-12-08 LAB — COMPREHENSIVE METABOLIC PANEL
ALT: 15 IU/L (ref 0–32)
AST: 11 IU/L (ref 0–40)
Albumin/Globulin Ratio: 1.3 (ref 1.2–2.2)
Albumin: 3.5 g/dL — ABNORMAL LOW (ref 3.7–4.7)
Alkaline Phosphatase: 74 IU/L (ref 48–121)
BUN/Creatinine Ratio: 14 (ref 12–28)
BUN: 10 mg/dL (ref 8–27)
Bilirubin Total: 0.3 mg/dL (ref 0.0–1.2)
CO2: 34 mmol/L — ABNORMAL HIGH (ref 20–29)
Calcium: 10.3 mg/dL (ref 8.7–10.3)
Chloride: 94 mmol/L — ABNORMAL LOW (ref 96–106)
Creatinine, Ser: 0.72 mg/dL (ref 0.57–1.00)
GFR calc Af Amer: 93 mL/min/{1.73_m2} (ref >59)
GFR calc non Af Amer: 81 mL/min/{1.73_m2} (ref >59)
Globulin, Total: 2.8 g/dL (ref 1.5–4.5)
Glucose: 124 mg/dL — ABNORMAL HIGH (ref 65–99)
Potassium: 3.3 mmol/L — ABNORMAL LOW (ref 3.5–5.2)
Sodium: 144 mmol/L (ref 134–144)
Total Protein: 6.3 g/dL (ref 6.0–8.5)

## 2019-12-09 ENCOUNTER — Telehealth: Payer: Self-pay

## 2019-12-09 NOTE — Telephone Encounter (Signed)
Attempted to call pt, LVM for results.  Labs have been faxed to [facility]

## 2019-12-09 NOTE — Telephone Encounter (Signed)
Pt verified by name and DOB, results given per provider, pt voiced understanding all question answered. 

## 2019-12-09 NOTE — Telephone Encounter (Signed)
-----   Message from Suzzanne Cloud, NP sent at 12/08/2019  6:01 AM EDT ----- Please fax labs to [facility] for MD to review. WBC elevated 14.1, potassium 3.3. Unclear any signs of infections? Labs look okay from Tecfidera standpoint.

## 2019-12-09 NOTE — Telephone Encounter (Signed)
-----   Message from Suzzanne Cloud, NP sent at 12/08/2019  6:01 AM EDT ----- Please fax labs to [facility] for MD to review. WBC elevated 14.1, potassium 3.3. Unclear any signs of infections? Labs look okay from Tecfidera standpoint.

## 2019-12-09 NOTE — Telephone Encounter (Signed)
error 

## 2019-12-14 LAB — BASIC METABOLIC PANEL
BUN: 10 (ref 4–21)
CO2: 32 — AB (ref 13–22)
Chloride: 96 — AB (ref 99–108)
Creatinine: 0.4 — AB (ref 0.5–1.1)
Glucose: 74
Potassium: 3.6 (ref 3.4–5.3)
Sodium: 144 (ref 137–147)

## 2019-12-14 LAB — CBC AND DIFFERENTIAL
HCT: 38 (ref 36–46)
Hemoglobin: 12.3 (ref 12.0–16.0)
Neutrophils Absolute: 9
Platelets: 334 (ref 150–399)
WBC: 11.5

## 2019-12-14 LAB — COMPREHENSIVE METABOLIC PANEL
Calcium: 10.1 (ref 8.7–10.7)
GFR calc Af Amer: 90
GFR calc non Af Amer: 90

## 2019-12-14 LAB — CBC: RBC: 4.5 (ref 3.87–5.11)

## 2019-12-20 ENCOUNTER — Telehealth: Payer: Self-pay | Admitting: Neurology

## 2020-01-06 ENCOUNTER — Other Ambulatory Visit: Payer: Self-pay

## 2020-01-06 ENCOUNTER — Ambulatory Visit (HOSPITAL_COMMUNITY)
Admission: RE | Admit: 2020-01-06 | Discharge: 2020-01-06 | Disposition: A | Discharge status: Home / Self Care | Payer: Medicare Other | Source: Ambulatory Visit | Attending: Physician Assistant | Admitting: Physician Assistant

## 2020-01-06 DIAGNOSIS — Z435 Encounter for attention to cystostomy: Secondary | ICD-10-CM | POA: Insufficient documentation

## 2020-01-06 DIAGNOSIS — Z9359 Other cystostomy status: Secondary | ICD-10-CM

## 2020-01-06 HISTORY — PX: IR CATHETER TUBE CHANGE: IMG717

## 2020-01-06 MED ORDER — FENTANYL CITRATE (PF) 100 MCG/2ML IJ SOLN
INTRAMUSCULAR | Status: AC
  Filled 2020-01-06: qty 2

## 2020-01-06 MED ORDER — FENTANYL CITRATE (PF) 100 MCG/2ML IJ SOLN
INTRAMUSCULAR | Status: AC | PRN
  Administered 2020-01-06 (×2): 50 ug via INTRAVENOUS

## 2020-01-06 MED ORDER — LIDOCAINE VISCOUS HCL 2 % MT SOLN
OROMUCOSAL | Status: AC
  Filled 2020-01-06: qty 15

## 2020-01-06 MED ORDER — IOHEXOL 300 MG/ML  SOLN
50.0000 mL | Freq: Once | INTRAMUSCULAR | Status: AC | PRN
  Administered 2020-01-06: 5 mL

## 2020-01-27 ENCOUNTER — Non-Acute Institutional Stay (SKILLED_NURSING_FACILITY): Payer: Medicare Other | Admitting: Internal Medicine

## 2020-01-27 ENCOUNTER — Encounter: Payer: Self-pay | Admitting: Internal Medicine

## 2020-01-27 DIAGNOSIS — E1140 Type 2 diabetes mellitus with diabetic neuropathy, unspecified: Secondary | ICD-10-CM

## 2020-01-27 DIAGNOSIS — I10 Essential (primary) hypertension: Secondary | ICD-10-CM

## 2020-01-27 DIAGNOSIS — F339 Major depressive disorder, recurrent, unspecified: Secondary | ICD-10-CM | POA: Diagnosis not present

## 2020-01-27 DIAGNOSIS — G35 Multiple sclerosis: Secondary | ICD-10-CM

## 2020-01-27 DIAGNOSIS — Z794 Long term (current) use of insulin: Secondary | ICD-10-CM

## 2020-01-27 LAB — BASIC METABOLIC PANEL
BUN: 12 (ref 4–21)
BUN: 12 (ref 4–21)
CO2: 33 — AB (ref 13–22)
CO2: 33 — AB (ref 13–22)
Chloride: 98 — AB (ref 99–108)
Chloride: 98 — AB (ref 99–108)
Creatinine: 0.5 (ref 0.5–1.1)
Creatinine: 0.5 (ref 0.5–1.1)
Glucose: 138
Glucose: 138
Potassium: 3.7 (ref 3.4–5.3)
Potassium: 3.7 (ref 3.4–5.3)
Sodium: 141 (ref 137–147)
Sodium: 141 (ref 137–147)

## 2020-01-27 LAB — COMPREHENSIVE METABOLIC PANEL
Calcium: 9.8 (ref 8.7–10.7)
Calcium: 9.8 (ref 8.7–10.7)
GFR calc Af Amer: 90
GFR calc Af Amer: >90
GFR calc non Af Amer: 90
GFR calc non Af Amer: >90

## 2020-01-27 LAB — HEMOGLOBIN A1C
Hemoglobin A1C: 5.8
Hemoglobin A1C: 5.8

## 2020-01-27 LAB — VITAMIN D 25 HYDROXY (VIT D DEFICIENCY, FRACTURES): Vit D, 25-Hydroxy: 51

## 2020-01-27 LAB — TSH: TSH: 2.34 (ref 0.41–5.90)

## 2020-01-27 NOTE — Assessment & Plan Note (Signed)
Subjectively stable on Cymbalta.

## 2020-01-27 NOTE — Patient Instructions (Signed)
See assessment and plan under each diagnosis in the problem list and acutely for this visit 

## 2020-01-27 NOTE — Assessment & Plan Note (Addendum)
Serial glucoses suggest adequate control. Last A1c was 7.1 in January in [place] indicating excellent control.I shall verify whether this has been updated with the [facility] NP

## 2020-01-27 NOTE — Progress Notes (Signed)
NURSING HOME LOCATION:  [facility] ROOM NUMBER:  [id number]  CODE STATUS:  DNR  PCP:  [name], MD  [place] [id number]   This is a nursing facility follow up of chronic medical diagnoses.  Interim medical record and care since last [place] visit was updated with review of diagnostic studies and change in clinical status since last visit were documented.  HPI: Her neurologic assessment of her MS is current and her MS is felt to be stable.  She is concerned as her Neurologist Dr. Jannifer Franklin is retiring next year.She admits to chronic depression related to her MS but feels that Cymbalta has helped. An attempt was made to replace the suprapubic catheter  by IR; the scar tissue was an issue.  A G-tube was placed.  Urology follow-up with Dr. Gloriann Loan is scheduled for Botox so an 24 French catheter can be placed.  She states they are awaiting insurance  authorization. Podiatry has prescribed doxycycline for cellulitis of the toenail areas. Her diabetes has been well controlled as morning glucoses range from 90 up to 134 and evening from 108 up to 195.  The latter is an outlier.  She feels that Trulicity is causing loose- watery stools intermittently.  Review of systems:  Constitutional: No fever, significant weight change  Eyes: No redness, discharge, pain, vision change ENT/mouth: No nasal congestion,  purulent discharge, earache, change in hearing, sore throat  Cardiovascular: No chest pain, palpitations, paroxysmal nocturnal dyspnea Respiratory: No cough, sputum production, hemoptysis, significant snoring, apnea   Gastrointestinal: No heartburn, dysphagia, abdominal pain, nausea /vomiting, rectal bleeding, melena, change in bowels Genitourinary: No hematuria, pyuria Musculoskeletal: No joint stiffness, joint swelling Dermatologic: No rash, pruritus, change in appearance of skin Neurologic: No dizziness, headache, syncope, seizures Psychiatric: No   insomnia, anorexia Endocrine: No change in hair/skin/nails, excessive thirst, excessive hunger, excessive urination  Hematologic/lymphatic: No significant bruising, lymphadenopathy, abnormal bleeding Allergy/immunology: No itchy/watery eyes, significant sneezing, urticaria, angioedema  Physical exam:  Pertinent or positive findings: She sits in the power wheelchair with minimal movement of the upper extremities.  There is no movement in the lower extremities.  Arcus senilis is present.  She exhibits a slight tachycardia.  There is central obesity with abdominal protuberance.  Trace edema is noted at the ankle/sock line.  Pedal pulses are palpable.  Clubbing of the nailbeds is noted. As noted there is no range of motion of the lower extremities.The left upper extremity appears to be slightly weaker than the right.  General appearance: Adequately nourished; no acute distress, increased work of breathing is present.   Lymphatic: No lymphadenopathy about the head, neck, axilla. Eyes: No conjunctival inflammation or lid edema is present. There is no scleral icterus. Ears:  External ear exam shows no significant lesions or deformities.   Nose:  External nasal examination shows no deformity or inflammation. Nasal mucosa are pink and moist without lesions, exudates Oral exam:  Lips and gums are healthy appearing. There is no oropharyngeal erythema or exudate. Neck:  No thyromegaly, masses, tenderness noted.    Heart:  No murmur, click, rub .  Lungs: Chest clear to auscultation without wheezes, rhonchi, rales, rubs. Abdomen: Bowel sounds are normal. Abdomen is soft and nontender with no organomegaly, hernias, masses. GU: Deferred  Extremities:  No cyanosis  Neurologic exam :Balance, Rhomberg, finger to nose testing could not be completed due to clinical state Skin: Warm & dry w/o tenting. No significant lesions or rash.  See summary  under each active problem in the Problem List with associated  updated therapeutic plan

## 2020-01-27 NOTE — Assessment & Plan Note (Signed)
BP controlled; no change in antihypertensive medications  

## 2020-01-27 NOTE — Assessment & Plan Note (Signed)
She reports no changes in her neuropathic symptoms. Clinically stable as per Dr. Jannifer Franklin.  Follow-up scheduled in 6 months.

## 2020-02-24 ENCOUNTER — Other Ambulatory Visit: Payer: Self-pay | Admitting: Urology

## 2020-02-26 IMAGING — CT CT IMAGE GUIDED FLUID DRAIN BY CATHETER
2 of 4 series · 13 of 32 positions shown, 18 images · non-contrast
Comparison: None

INDICATION: History of advanced multiple sclerosis, now with non functionality
of the lower extremities and chronic incontinence. As such, request
made for placement of image guided suprapubic catheters for the
purposes of urinary diversion.

EXAM:
ULTRASOUND AND CT GUIDED SUPRAPUBIC CATHETER PLACEMENT

[Series 2: i-spiral 5.0 b31f · axial · 0.98mm/px · z∈[-196,-49]mm · 8 of 56 slices shown, 13 images (1 of 2)]
[im 7/56  soft-tissue]
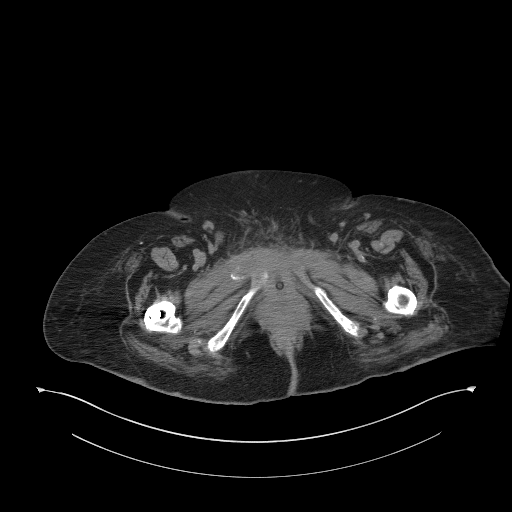
[im 7/56  bone]
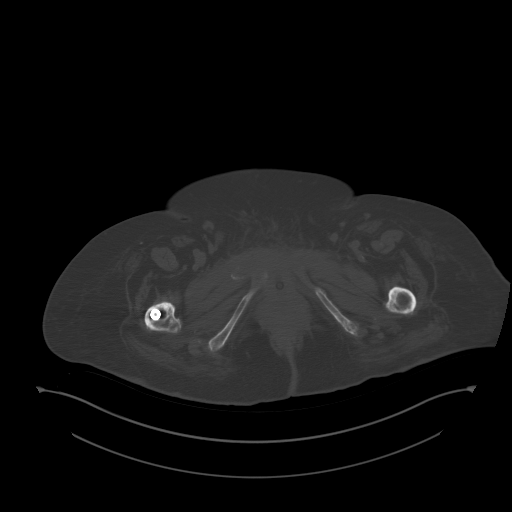
[im 13/56  soft-tissue]
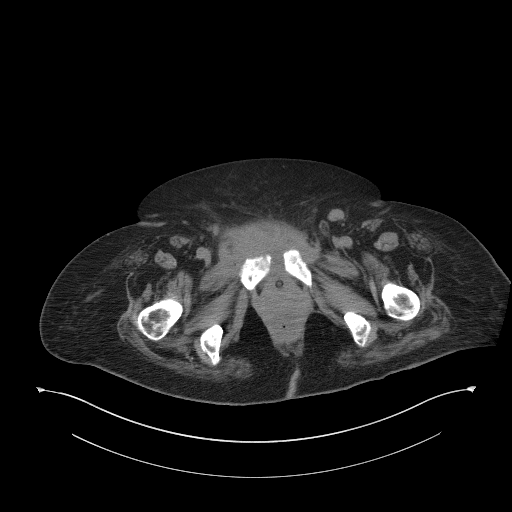
[im 19/56  soft-tissue]
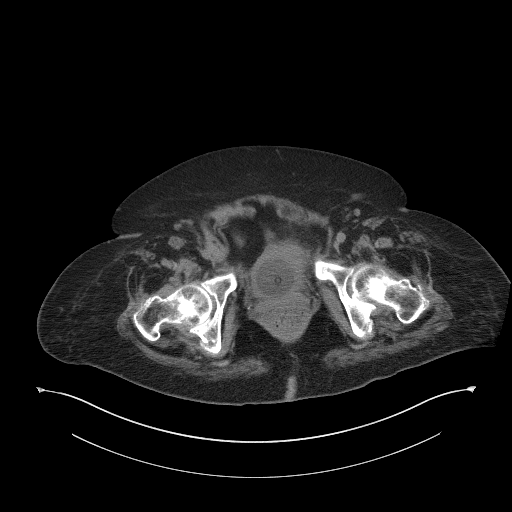
[im 25/56  soft-tissue]
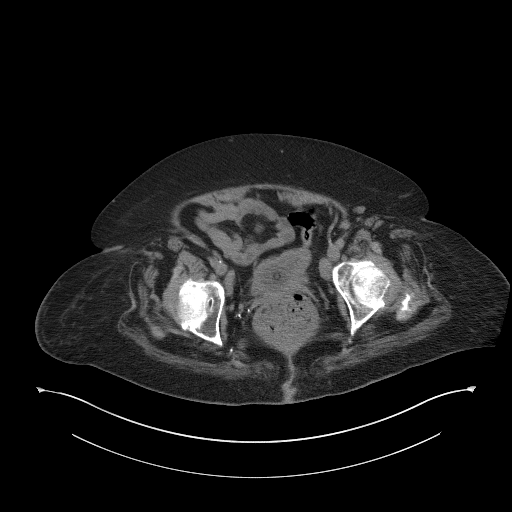
[im 31/56  soft-tissue]
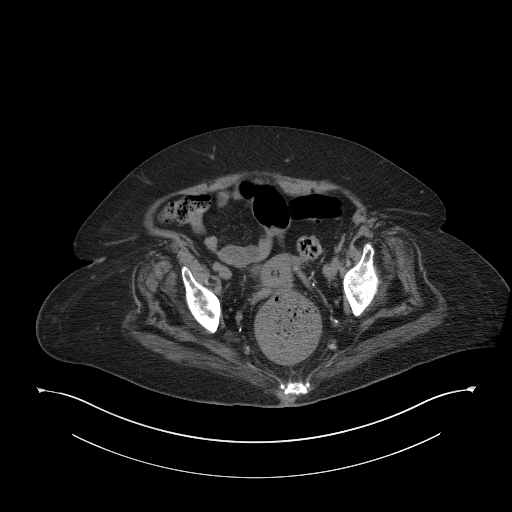
[im 31/56  lung]
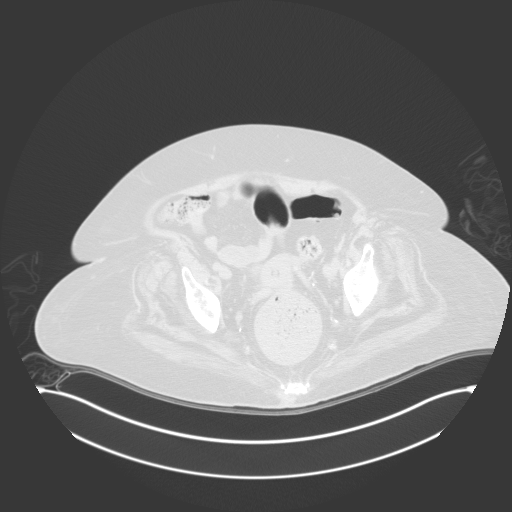
[im 37/56  soft-tissue]
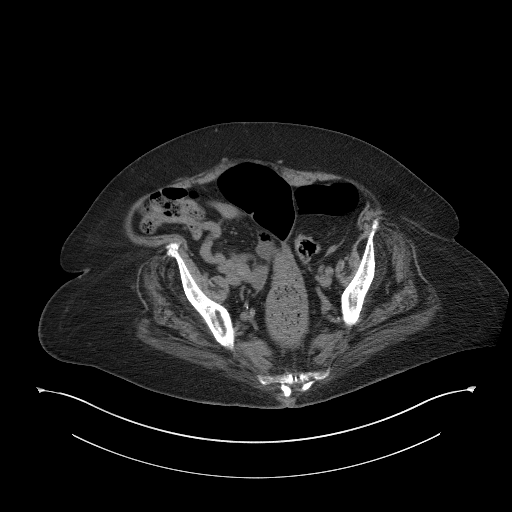
[im 37/56  lung]
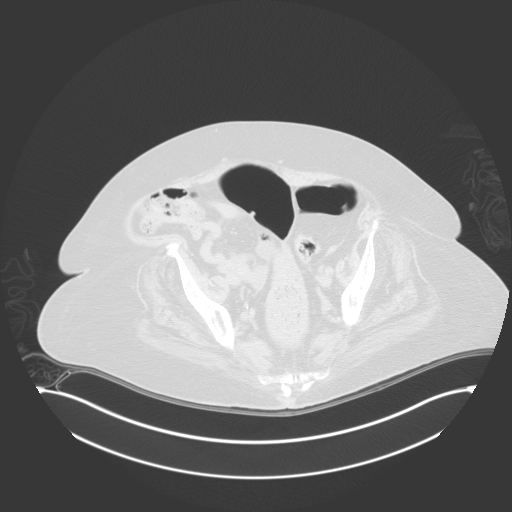
[im 43/56  soft-tissue]
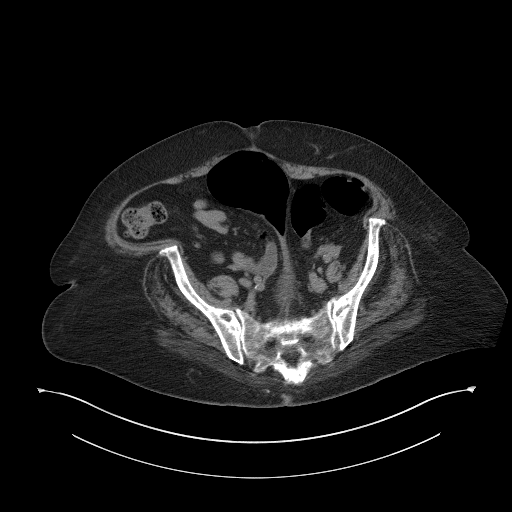
[im 43/56  lung]
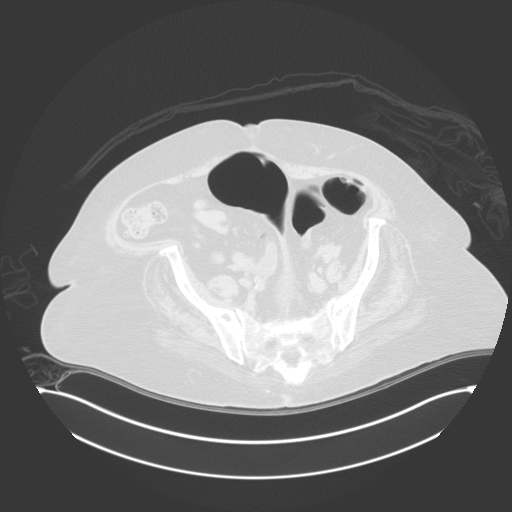
[im 49/56  soft-tissue]
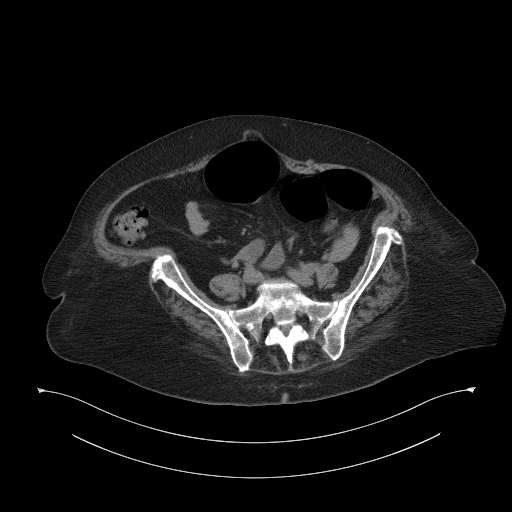
[im 49/56  lung]
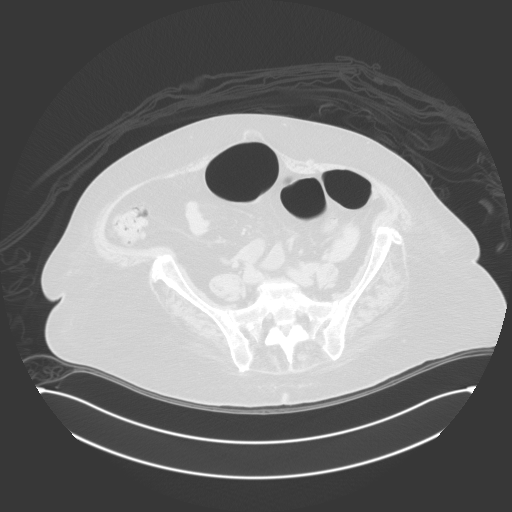

[Series 3: i-spiral 5.0 b31f · axial · 0.98mm/px · z∈[-196,-112]mm · 5 of 56 slices shown (2 of 2)]
[im 7/56  soft-tissue]
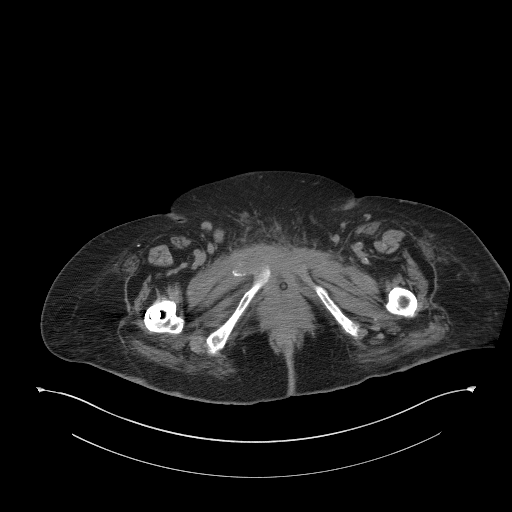
[im 13/56  soft-tissue]
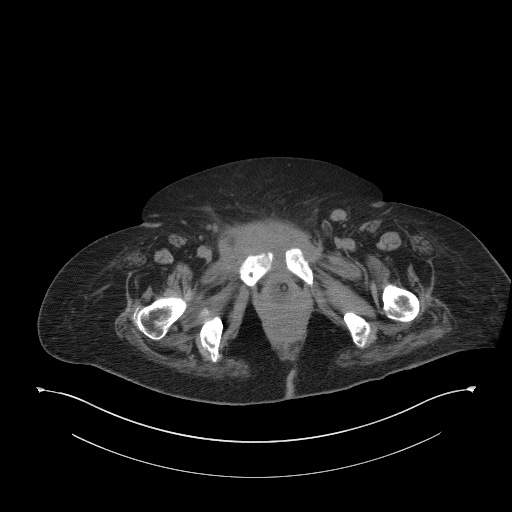
[im 19/56  soft-tissue]
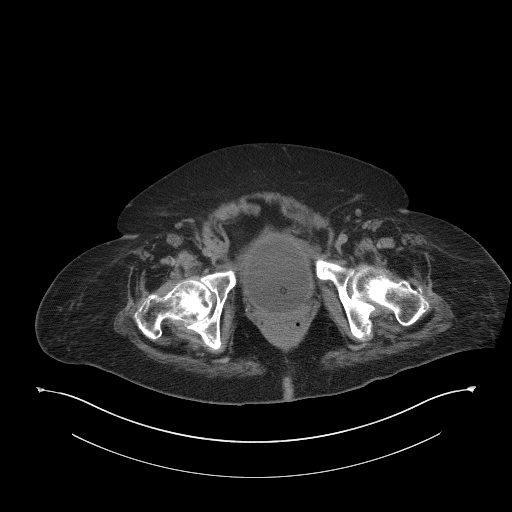
[im 25/56  soft-tissue]
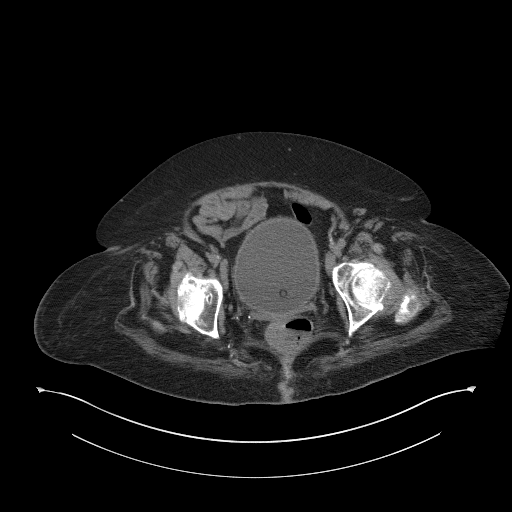
[im 31/56  soft-tissue]
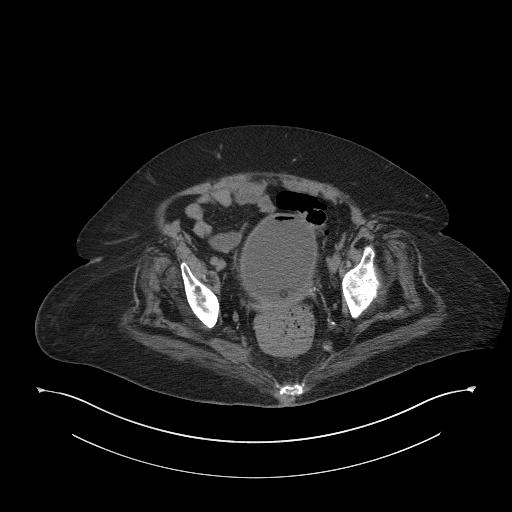

[13 of 32 positions shown; findings below may reference images not displayed]

MEDICATIONS:
None

ANESTHESIA/SEDATION:
Moderate (conscious) sedation was employed during this procedure. A
total of Versed 1 mg and Fentanyl 50 mcg was administered
intravenously.

Moderate Sedation Time: 18 minutes. The patient's level of
consciousness and vital signs were monitored continuously by
radiology nursing throughout the procedure under my direct
supervision.

CONTRAST:  None

COMPLICATIONS:
None immediate.

PROCEDURE:
The procedure, risks, benefits, and alternatives were explained to
the patient. Questions regarding the procedure were encouraged and
answered. The patient understands and consents to the procedure. A
timeout was performed prior to the initiation of the procedure.

The patient was positioned supine on the CT gantry. Preprocedural
imaging was obtained of the lower pelvis. The skin overlying the
anterior aspect the lower pelvis was prepped and draped in usual
sterile fashion.

The patient's the urinary bladder was then distended with the
administration of approximately 400 cc of saline via the existing
Foley catheter.

Under direct ultrasound guidance, a 22 gauge needle was utilized for
the purposes of procedural planning after the overlying soft tissues
were anesthetized with 1% lidocaine. Next, an 18 gauge trocar needle
was advanced into the urinary bladder under direct ultrasound
guidance. Ultrasound image was saved for procedural documentation
purposes. A short Amplatz wire was coiled within the urinary
bladder. Appropriate position was confirmed with a limited pelvic
CT.

The track was serially dilated ultimately allowing placement of a 12
French all-purpose drainage catheter with end coiled and locked
within the urinary bladder.

The catheter was connected to a gravity bag. The exit site of the
catheter was secured within interrupted suture. A dressing was
placed. The patient tolerated the procedure well without immediate
postprocedural complication.
FINDINGS: CT imaging demonstrates decompression of the urinary bladder via a
Foley catheter.

There is no bowel interposed between the anterior aspect of the
urinary bladder and the ventral lower abdominal/pelvic wall.

After distension of the urinary bladder with the administration of
saline via the Foley catheter, ultrasound and CT was utilized for
the placement of a 12 French percutaneous drainage catheter with end
coiled and locked within the urinary bladder.
IMPRESSION: Technically successful ultrasound and CT-guided placement of a 12
French suprapubic catheter.

PLAN:
Recommend fluoroscopic guided exchange and up sizing of this
suprapubic drainage catheter in approximately 6-8 weeks.

## 2020-03-16 LAB — BASIC METABOLIC PANEL
BUN: 14 (ref 4–21)
BUN: 14 (ref 4–21)
CO2: 29 — AB (ref 13–22)
CO2: 29 — AB (ref 13–22)
Chloride: 101 (ref 99–108)
Chloride: 101 (ref 99–108)
Creatinine: 0.5 (ref 0.5–1.1)
Creatinine: 0.5 (ref 0.5–1.1)
Glucose: 96
Glucose: 96
Potassium: 4.3 (ref 3.4–5.3)
Potassium: 4.3 (ref 3.4–5.3)
Sodium: 144 (ref 137–147)
Sodium: 144 (ref 137–147)

## 2020-03-16 LAB — COMPREHENSIVE METABOLIC PANEL
Calcium: 9.9 (ref 8.7–10.7)
Calcium: 9.9 (ref 8.7–10.7)
GFR calc Af Amer: 90
GFR calc Af Amer: >90
GFR calc non Af Amer: 90
GFR calc non Af Amer: >90

## 2020-04-04 NOTE — Patient Instructions (Addendum)
Preop instructions for:     Sura Canul. Dimitrov Date of Birth:   [birth year]                    Date of Procedure:  Monday, Oct. 11, 2021 Procedure:   CYSTOSCOPY BOTOX [place] AND EXCHANGE     Surgeon: Dr. Festus Aloe Facility contact: [facility]     Phone: [place] POA:N/A RN contact name/phone#: Lavell Luster and Glennon Hamilton                         and Fax #: [phone]   Transportation contact phone#: [facility]    Time to arrive at [facility]: [id number]   Report to: Admitting (On your left hand side)    Do not eat or drink past midnight the night before your procedure.(To include any tube feedings-must be discontinued)    Take these morning medications only with sips of water.(or give through gastrostomy or feeding tube). DULOXETINE, METHIMAZOLE, METOPROLOL, PANTOPRAZOLE, PREDNISONE, SOLIFENACIN, TECFIDERA, GABAPENTIN, May use eyedrops   The Night Before Surgery: TAKE ONLY HALF NORMAL DOSE OF LANTUS INSULIN AT BEDTIME   Note: No Insulin or Diabetic meds should be given or taken the morning of the procedure!     Please send day of procedure:current med list and meds last taken that day, confirm nothing by mouth status from what time, Patient Demographic info( to include DNR status, problem list, allergies)   Bring Insurance card and picture ID Leave all jewelry and other valuables at place where living( no metal or rings to be worn) No contact lens Women-no make-up, no lotions,perfumes,powders    Any questions day of procedure,call  SHORT [phone]     Sent from :[facility]                   Phone:[phone]                   Fax:[phone]   Sent by :    Renise Gillies BSN, RN

## 2020-04-05 ENCOUNTER — Other Ambulatory Visit: Payer: Self-pay | Admitting: Urology

## 2020-04-05 MED ORDER — CEFAZOLIN SODIUM-DEXTROSE 2-4 GM/100ML-% IV SOLN: 2.0000 g | INTRAVENOUS | Status: DC

## 2020-04-09 MED ORDER — SODIUM CHLORIDE 0.9 % IV SOLN
2.0000 g | Freq: Once | INTRAVENOUS | Status: DC
  Filled 2020-04-09: qty 2

## 2020-04-09 MED ORDER — CIPROFLOXACIN IN D5W 400 MG/200ML IV SOLN: 400.0000 mg | Freq: Two times a day (BID) | INTRAVENOUS | Status: DC

## 2020-04-10 ENCOUNTER — Ambulatory Visit (HOSPITAL_COMMUNITY): Payer: Medicare Other | Admitting: Certified Registered Nurse Anesthetist

## 2020-04-10 ENCOUNTER — Ambulatory Visit (HOSPITAL_COMMUNITY)
Admission: RE | Admit: 2020-04-10 | Discharge: 2020-04-10 | Disposition: A | Discharge status: Home / Self Care | Payer: Medicare Other | Attending: Urology | Admitting: Urology

## 2020-04-10 ENCOUNTER — Encounter (HOSPITAL_COMMUNITY): Payer: Self-pay | Admitting: Urology

## 2020-04-10 ENCOUNTER — Encounter (HOSPITAL_COMMUNITY)
Admission: RE | Disposition: A | Discharge status: Home / Self Care | Payer: Self-pay | Source: Home / Self Care | Attending: Urology

## 2020-04-10 DIAGNOSIS — F329 Major depressive disorder, single episode, unspecified: Secondary | ICD-10-CM | POA: Diagnosis not present

## 2020-04-10 DIAGNOSIS — Z833 Family history of diabetes mellitus: Secondary | ICD-10-CM | POA: Insufficient documentation

## 2020-04-10 DIAGNOSIS — Z888 Allergy status to other drugs, medicaments and biological substances status: Secondary | ICD-10-CM | POA: Insufficient documentation

## 2020-04-10 DIAGNOSIS — Z20822 Contact with and (suspected) exposure to covid-19: Secondary | ICD-10-CM | POA: Diagnosis not present

## 2020-04-10 DIAGNOSIS — R32 Unspecified urinary incontinence: Secondary | ICD-10-CM | POA: Diagnosis not present

## 2020-04-10 DIAGNOSIS — Z79899 Other long term (current) drug therapy: Secondary | ICD-10-CM | POA: Diagnosis not present

## 2020-04-10 DIAGNOSIS — Z7952 Long term (current) use of systemic steroids: Secondary | ICD-10-CM | POA: Insufficient documentation

## 2020-04-10 DIAGNOSIS — N319 Neuromuscular dysfunction of bladder, unspecified: Secondary | ICD-10-CM | POA: Insufficient documentation

## 2020-04-10 DIAGNOSIS — G35 Multiple sclerosis: Secondary | ICD-10-CM | POA: Insufficient documentation

## 2020-04-10 DIAGNOSIS — Z96653 Presence of artificial knee joint, bilateral: Secondary | ICD-10-CM | POA: Insufficient documentation

## 2020-04-10 DIAGNOSIS — Z9049 Acquired absence of other specified parts of digestive tract: Secondary | ICD-10-CM | POA: Diagnosis not present

## 2020-04-10 DIAGNOSIS — Z885 Allergy status to narcotic agent status: Secondary | ICD-10-CM | POA: Diagnosis not present

## 2020-04-10 DIAGNOSIS — I10 Essential (primary) hypertension: Secondary | ICD-10-CM | POA: Insufficient documentation

## 2020-04-10 DIAGNOSIS — K219 Gastro-esophageal reflux disease without esophagitis: Secondary | ICD-10-CM | POA: Insufficient documentation

## 2020-04-10 DIAGNOSIS — Z6832 Body mass index (BMI) 32.0-32.9, adult: Secondary | ICD-10-CM | POA: Insufficient documentation

## 2020-04-10 DIAGNOSIS — Z87440 Personal history of urinary (tract) infections: Secondary | ICD-10-CM | POA: Diagnosis not present

## 2020-04-10 DIAGNOSIS — G8220 Paraplegia, unspecified: Secondary | ICD-10-CM | POA: Diagnosis not present

## 2020-04-10 DIAGNOSIS — E119 Type 2 diabetes mellitus without complications: Secondary | ICD-10-CM | POA: Insufficient documentation

## 2020-04-10 DIAGNOSIS — E669 Obesity, unspecified: Secondary | ICD-10-CM | POA: Diagnosis not present

## 2020-04-10 DIAGNOSIS — Z794 Long term (current) use of insulin: Secondary | ICD-10-CM | POA: Diagnosis not present

## 2020-04-10 HISTORY — PX: INSERTION OF SUPRAPUBIC CATHETER: SHX5870

## 2020-04-10 HISTORY — PX: BOTOX INJECTION: SHX5754

## 2020-04-10 LAB — CBC
HCT: 39.6 % (ref 36.0–46.0)
Hemoglobin: 12.7 g/dL (ref 12.0–15.0)
MCH: 29.6 pg (ref 26.0–34.0)
MCHC: 32.1 g/dL (ref 30.0–36.0)
MCV: 92.3 fL (ref 80.0–100.0)
Platelets: 327 10*3/uL (ref 150–400)
RBC: 4.29 MIL/uL (ref 3.87–5.11)
RDW: 16.4 % — ABNORMAL HIGH (ref 11.5–15.5)
WBC: 13.8 10*3/uL — ABNORMAL HIGH (ref 4.0–10.5)
nRBC: 0 % (ref 0.0–0.2)

## 2020-04-10 LAB — GLUCOSE, CAPILLARY: Glucose-Capillary: 97 mg/dL (ref 70–99)

## 2020-04-10 LAB — BASIC METABOLIC PANEL
Anion gap: 13 (ref 5–15)
BUN: 17 mg/dL (ref 8–23)
CO2: 31 mmol/L (ref 22–32)
Calcium: 9.3 mg/dL (ref 8.9–10.3)
Chloride: 99 mmol/L (ref 98–111)
Creatinine, Ser: 0.6 mg/dL (ref 0.44–1.00)
GFR, Estimated: >60 mL/min (ref >60)
Glucose, Bld: 108 mg/dL — ABNORMAL HIGH (ref 70–99)
Potassium: 4.4 mmol/L (ref 3.5–5.1)
Sodium: 143 mmol/L (ref 135–145)

## 2020-04-10 LAB — RESP PANEL BY RT PCR (RSV, FLU A&B, COVID)
Influenza A by PCR: NEGATIVE
Influenza B by PCR: NEGATIVE
Respiratory Syncytial Virus by PCR: NEGATIVE
SARS Coronavirus 2 by RT PCR: NEGATIVE

## 2020-04-10 SURGERY — BOTOX INJECTION
Anesthesia: General | Site: Bladder

## 2020-04-10 MED ORDER — ONDANSETRON HCL 4 MG/2ML IJ SOLN
INTRAMUSCULAR | Status: DC | PRN
  Administered 2020-04-10: 4 mg via INTRAVENOUS

## 2020-04-10 MED ORDER — CHLORHEXIDINE GLUCONATE 0.12 % MT SOLN
15.0000 mL | Freq: Once | OROMUCOSAL | Status: AC
  Administered 2020-04-10: 15 mL via OROMUCOSAL

## 2020-04-10 MED ORDER — ONABOTULINUMTOXINA 100 UNITS IJ SOLR
INTRAMUSCULAR | Status: DC | PRN
  Administered 2020-04-10: 200 [IU] via INTRAMUSCULAR

## 2020-04-10 MED ORDER — SODIUM CHLORIDE 0.9 % IV SOLN
2.0000 g | Freq: Once | INTRAVENOUS | Status: AC
  Administered 2020-04-10: 2 g via INTRAVENOUS
  Filled 2020-04-10: qty 2

## 2020-04-10 MED ORDER — MEPERIDINE HCL 50 MG/ML IJ SOLN: 6.2500 mg | INTRAMUSCULAR | Status: DC | PRN

## 2020-04-10 MED ORDER — LACTATED RINGERS IV SOLN: INTRAVENOUS | Status: DC

## 2020-04-10 MED ORDER — ONABOTULINUMTOXINA 100 UNITS IJ SOLR
INTRAMUSCULAR | Status: AC
  Filled 2020-04-10: qty 200

## 2020-04-10 MED ORDER — FENTANYL CITRATE (PF) 100 MCG/2ML IJ SOLN
INTRAMUSCULAR | Status: AC
  Filled 2020-04-10: qty 2

## 2020-04-10 MED ORDER — FENTANYL CITRATE (PF) 100 MCG/2ML IJ SOLN
INTRAMUSCULAR | Status: DC | PRN
  Administered 2020-04-10: 50 ug via INTRAVENOUS

## 2020-04-10 MED ORDER — PROPOFOL 10 MG/ML IV BOLUS
INTRAVENOUS | Status: DC | PRN
  Administered 2020-04-10: 140 mg via INTRAVENOUS

## 2020-04-10 MED ORDER — LIDOCAINE 2% (20 MG/ML) 5 ML SYRINGE
INTRAMUSCULAR | Status: DC | PRN
  Administered 2020-04-10: 80 mg via INTRAVENOUS

## 2020-04-10 MED ORDER — SODIUM CHLORIDE (PF) 0.9 % IJ SOLN
INTRAMUSCULAR | Status: AC
  Filled 2020-04-10: qty 50

## 2020-04-10 MED ORDER — ORAL CARE MOUTH RINSE: 15.0000 mL | Freq: Once | OROMUCOSAL | Status: AC

## 2020-04-10 MED ORDER — HYDROMORPHONE HCL 1 MG/ML IJ SOLN: 0.2500 mg | INTRAMUSCULAR | Status: DC | PRN

## 2020-04-10 MED ORDER — PROPOFOL 10 MG/ML IV BOLUS
INTRAVENOUS | Status: AC
  Filled 2020-04-10: qty 20

## 2020-04-10 MED ORDER — ONDANSETRON HCL 4 MG/2ML IJ SOLN: 4.0000 mg | Freq: Once | INTRAMUSCULAR | Status: DC | PRN

## 2020-04-10 MED ORDER — STERILE WATER FOR IRRIGATION IR SOLN
Status: DC | PRN
  Administered 2020-04-10: 3000 mL via INTRAVESICAL

## 2020-04-10 SURGICAL SUPPLY — 37 items
BAG DRN RND TRDRP ANRFLXCHMBR (UROLOGICAL SUPPLIES) ×4
BAG URINE DRAIN 2000ML AR STRL (UROLOGICAL SUPPLIES) ×4 IMPLANT
BAG URINE LEG 500ML (DRAIN) ×3 IMPLANT
BAG URO CATCHER STRL LF (MISCELLANEOUS) ×3 IMPLANT
BLADE SURG 15 STRL LF DISP TIS (BLADE) ×2 IMPLANT
BLADE SURG 15 STRL SS (BLADE) ×3
CATH SILICONE 5CC 18FR (INSTRUMENTS) ×1 IMPLANT
CLOTH BEACON ORANGE TIMEOUT ST (SAFETY) ×3 IMPLANT
COUNTER NEEDLE 20 DBL MAG RED (NEEDLE) ×3 IMPLANT
COVER SURGICAL LIGHT HANDLE (MISCELLANEOUS) ×3 IMPLANT
COVER WAND RF STERILE (DRAPES) IMPLANT
ELECT REM PT RETURN 15FT ADLT (MISCELLANEOUS) ×3 IMPLANT
GAUZE 4X4 16PLY RFD (DISPOSABLE) ×3 IMPLANT
GLOVE BIOGEL M STRL SZ7.5 (GLOVE) ×3 IMPLANT
GOWN STRL REUS W/TWL XL LVL3 (GOWN DISPOSABLE) ×6 IMPLANT
GUIDEWIRE STR DUAL SENSOR (WIRE) ×1 IMPLANT
KIT TURNOVER KIT A (KITS) IMPLANT
MANIFOLD NEPTUNE II (INSTRUMENTS) ×3 IMPLANT
NDL ASPIRATION 22 (NEEDLE) ×2 IMPLANT
NDL SAFETY ECLIPSE 18X1.5 (NEEDLE) IMPLANT
NEEDLE ASPIRATION 22 (NEEDLE) ×3 IMPLANT
NEEDLE HYPO 18GX1.5 SHARP (NEEDLE) ×3
NEEDLE HYPO 22GX1.5 SAFETY (NEEDLE) IMPLANT
NS IRRIG 1000ML POUR BTL (IV SOLUTION) ×3 IMPLANT
PACK CYSTO (CUSTOM PROCEDURE TRAY) ×3 IMPLANT
PENCIL SMOKE EVACUATOR (MISCELLANEOUS) IMPLANT
PLUG CATH AND CAP STER (CATHETERS) IMPLANT
SET AMPLATZ RENAL DILATOR (MISCELLANEOUS) ×1 IMPLANT
SPONGE DRAIN TRACH 4X4 STRL 2S (GAUZE/BANDAGES/DRESSINGS) ×1 IMPLANT
SUT SILK 2 0 SH (SUTURE) ×3 IMPLANT
SYR 10ML LL (SYRINGE) ×3 IMPLANT
SYR 20ML LL LF (SYRINGE) ×3 IMPLANT
SYR CONTROL 10ML LL (SYRINGE) IMPLANT
TAPE CLOTH SURG 4X10 WHT LF (GAUZE/BANDAGES/DRESSINGS) ×1 IMPLANT
TOWEL OR 17X26 10 PK STRL BLUE (TOWEL DISPOSABLE) ×3 IMPLANT
TUBING CONNECTING 10 (TUBING) IMPLANT
WATER STERILE IRR 3000ML UROMA (IV SOLUTION) ×3 IMPLANT

## 2020-04-10 NOTE — Op Note (Signed)
Preoperative diagnosis: Neurogenic bladder Postoperative diagnosis: Same  Procedure: Cystoscopy with Botox injection 200 units, suprapubic tube tract dilation and exchange  Surgeon: Junious Silk  Anesthesia: General  Indication for procedure: Laura Roberts is a 79 year old female she typically follows with Dr. Gloriann Loan.  It was time for her to have suprapubic tube change and she was hoping to upsize from a 16 [place] gastrostomy tube to an 47 French catheter.  Also she had more leakage per urethra and was ready to undergo repeat Botox.  Last Botox was about 6 months ago.  Findings: Patulous urethra with prior erosion evident.  Decent coaptation of the membranous urethra.  Bladder with some capacity and erythema from the catheter but no mucosal lesions, stone or foreign body.  Suprapubic tube tract was quite tight and it was dilated from 16-20 [place] and then the 18 [place] catheter placed over a wire.  Description of procedure: After consent was obtained patient brought to the operating room.  After adequate anesthesia she is placed in lithotomy position and prepped and draped in the usual sterile fashion.  Timeout was performed from the patient and procedure.  The cystoscope with the Botox needle was passed per urethra and the bladder irrigated several times.  Urine was clear.  Then 200 units of Botox was injected in the typical 1 mL increments at 20 injection sites in a grid pattern.  Hemostasis was excellent.  The G-tube balloon was then deflated.  I passed a wire through the G-tube into the bladder and remove the G-tube.  The suprapubic tube site was prepped again.  Amplatz dilators were then passed 16 through 20 [place].  I then passed the Silastic catheter onto the sensor wire using an 18-gauge needle and then passed the catheter over the wire into the bladder.  The sensor wire was removed leaving the catheter in the bladder and the balloon inflated and seated up at the bladder wall.  Dilation procedure,  balloon inflation and catheter manipulation all done under direct cystoscopic guidance.  The scope was then removed and the bladder allowed to drain through the new SP tube which was draining clear urine.  She was awakened taken recovery room in stable condition.  Complications: None  Blood loss: Minimal  Specimens: None  Drains: 18 French suprapubic tube Silastic catheter  Disposition: Patient stable to PACU

## 2020-04-10 NOTE — Anesthesia Procedure Notes (Signed)
Procedure Name: LMA Insertion Performed by: Deloss Amico J, CRNA Pre-anesthesia Checklist: Patient identified, Emergency Drugs available, Suction available, Patient being monitored and Timeout performed Patient Re-evaluated:Patient Re-evaluated prior to induction Oxygen Delivery Method: Circle system utilized Preoxygenation: Pre-oxygenation with 100% oxygen Induction Type: IV induction Ventilation: Mask ventilation without difficulty LMA: LMA inserted LMA Size: 4.0 Number of attempts: 1 Placement Confirmation: positive ETCO2 and breath sounds checked- equal and bilateral Tube secured with: Tape Dental Injury: Teeth and Oropharynx as per pre-operative assessment        

## 2020-04-10 NOTE — Anesthesia Postprocedure Evaluation (Signed)
Anesthesia Post Note  Patient: Laura Roberts  Procedure(s) Performed: CYSTOSCOPY BOTOX INJECTION (N/A Bladder) SUPRAPUBIC CATHETER EXCHANGE/TRACT  DILATION (N/A Abdomen)     Patient location during evaluation: PACU Anesthesia Type: General Level of consciousness: awake and alert Pain management: pain level controlled Vital Signs Assessment: post-procedure vital signs reviewed and stable Respiratory status: spontaneous breathing, nonlabored ventilation, respiratory function stable and patient connected to nasal cannula oxygen Cardiovascular status: blood pressure returned to baseline and stable Postop Assessment: no apparent nausea or vomiting Anesthetic complications: no   No complications documented.  Last Vitals:  Vitals:   04/10/20 1445 04/10/20 1521  BP: (!) 149/94 (!) 149/81  Pulse: 85 88  Resp: 13 14  Temp: 36.7 C (!) 36.3 C  SpO2: 94% 96%    Last Pain:  Vitals:   04/10/20 1521  TempSrc:   PainSc: 0-No pain                 Nuri Larmer DAVID

## 2020-04-10 NOTE — Transfer of Care (Signed)
Immediate Anesthesia Transfer of Care Note  Patient: Laura Roberts  Procedure(s) Performed: CYSTOSCOPY BOTOX INJECTION (N/A Bladder) SUPRAPUBIC CATHETER EXCHANGE/TRACT  DILATION (N/A Abdomen)  Patient Location: PACU  Anesthesia Type:General  Level of Consciousness: sedated, patient cooperative and responds to stimulation  Airway & Oxygen Therapy: Patient Spontanous Breathing and Patient connected to face mask oxygen  Post-op Assessment: Report given to RN and Post -op Vital signs reviewed and stable  Post vital signs: Reviewed and stable  Last Vitals:  Vitals Value Taken Time  BP 156/85 04/10/20 1321  Temp    Pulse 86 04/10/20 1323  Resp 10 04/10/20 1323  SpO2 100 % 04/10/20 1323  Vitals shown include unvalidated device data.  Last Pain:  Vitals:   04/10/20 1022  TempSrc:   PainSc: 0-No pain         Complications: No complications documented.

## 2020-04-10 NOTE — H&P (Signed)
H&P  Chief Complaint: Neurogenic bladder, incontinence  History of Present Illness: Laura Roberts is a 79 year old female with a history of MS and neurogenic bladder.  She is managed with a suprapubic tube since March 2019.  She gets leakage per urethra and will undergo Botox.  Her suprapubic tube fell out and the tract closed April 2021.  It has since been replaced by IR and upsized to a 16 [place] catheter.  She requests an 8 French catheter.  Also she has had more leakage per urethra and Botox is planned.  She typically follows with one of my partners.  She has been well and I started her on nitrofurantoin preoperatively.  She has been taking that and had clear urine.  Past Medical History:  Diagnosis Date  . Age related osteoporosis   . Aphasia   . Bronchitis    hx of   . Cellulitis and abscess of toe    hx of   . Constipation   . Cystostomy in place [facility])   . Degenerative arthritis    osteoarthritis   . Depression   . Diabetes mellitus without complication ([place])    type 2   . Edema    localized   . Full incontinence of feces   . Gait disturbance   . GERD (gastroesophageal reflux disease)   . Gout    hx of   . History of falling   . Hyperlipidemia   . Hypertension   . Hyperthyroidism   . Multiple sclerosis ([place])   . Neuromuscular disorder (HCC)    Bilateral hand carpal tunnel syndrome  . Neuromuscular dysfunction of bladder   . Nontoxic multinodular goiter   . Obesity   . Osteoporosis   . Other adrenocortical insufficiency ([place])   . Paraplegia (HCC)    LEGS  . Personal history of urinary (tract) infections   . Polyneuropathy   . Pressure ulcer of right buttock, stage 4 ([place])   . Spinal stenosis in cervical region   . Spinal stenosis of lumbosacral region   . Spinal stenosis, thoracic   . Tinea unguium   . Vitamin D deficiency    Past Surgical History:  Procedure Laterality Date  . ABDOMINAL HYSTERECTOMY    . BACK SURGERY    . BOTOX INJECTION N/A 01/30/2018    Procedure: CYSTOSCOPY BOTOX INJECTION, SUPRAPUBIC EXCHANGE;  Surgeon: Lucas Mallow, MD;  Location: [facility];  Service: Urology;  Laterality: N/A;  . BOTOX INJECTION N/A 09/02/2018   Procedure: BOTOX INJECTION;  Surgeon: Lucas Mallow, MD;  Location: [facility];  Service: Urology;  Laterality: N/A;  . BOTOX INJECTION N/A 05/10/2019   Procedure: CYSTOSCOPY BOTOX INJECTION, SUPRAPUBIC EXCHANGE;  Surgeon: Lucas Mallow, MD;  Location: [facility];  Service: Urology;  Laterality: N/A;  . BOTOX INJECTION N/A 09/06/2019   Procedure: CYSTOSCOPY BOTOX INJECTION;  Surgeon: Lucas Mallow, MD;  Location: [facility];  Service: Urology;  Laterality: N/A;  . CATARACT EXTRACTION Right   . CHOLECYSTECTOMY    . CYSTOSCOPY N/A 09/02/2018   Procedure: CYSTOSCOPY;  Surgeon: Lucas Mallow, MD;  Location: [facility];  Service: Urology;  Laterality: N/A;  . CYSTOSTOMY N/A 09/02/2018   Procedure: CYSTOSTOMY SUPRAPUBIC;  Surgeon: Lucas Mallow, MD;  Location: [facility];  Service: Urology;  Laterality: N/A;  54 MINS  . FEMUR IM NAIL Right 06/02/2014   Procedure: INTRAMEDULLARY (IM) RETROGRADE FEMORAL NAILING;  Surgeon: Johnny Bridge, MD;  Location: [facility];  Service: Orthopedics;  Laterality: Right;  . GROIN DISSECTION Left 10/24/2017   Procedure: IRRIGATION AND DEBRIDEMENT, LEFT THIGH ABCESS ;  Surgeon: Alphonsa Overall, MD;  Location: [facility];  Service: General;  Laterality: Left;  . INSERTION OF SUPRAPUBIC CATHETER N/A 09/06/2019   Procedure: SUPRAPUBIC TUBE CHANGE;  Surgeon: Lucas Mallow, MD;  Location: [facility];  Service: Urology;  Laterality: N/A;  . IR CATHETER TUBE CHANGE  10/13/2017  . IR CATHETER TUBE CHANGE  07/15/2018  . IR CATHETER TUBE CHANGE  08/26/2019  . IR CATHETER TUBE CHANGE  11/25/2019  . IR CATHETER TUBE CHANGE  01/06/2020  . left hand carpal tunnel surgery    . REPLACEMENT TOTAL KNEE BILATERAL  2004    Home Medications:  Medications Prior to Admission  Medication Sig Dispense Refill Last Dose  .  acetaminophen (TYLENOL) 325 MG tablet Take 650 mg by mouth every 6 (six) hours as needed for mild pain or fever.    Past Week at Unknown time  . baclofen (LIORESAL) 10 MG tablet Take 10-20 mg by mouth See admin instructions. (0800 & 1200) TAKE  1.5 tabs (15 mg)  BY MOUTH ONCE DAILY IN THE MORNING & TAKE 1 TABLET (10 MG) BY MOUTH AT NOON   04/10/2020 at Unknown time  . baclofen (LIORESAL) 20 MG tablet Take 20 mg by mouth at bedtime.    04/09/2020 at Unknown time  . barrier cream (NON-SPECIFIED) CREA Apply 1 application topically 2 (two) times daily. Apply to right ischium and bilateral buttocks   Past Week at Unknown time  . calcium citrate (CALCITRATE - DOSED IN MG ELEMENTAL CALCIUM) 950 MG tablet Take 200 mg of elemental calcium by mouth daily. (0800)   Past Week at Unknown time  . Carboxymeth-Glycerin-Polysorb (REFRESH OPTIVE ADVANCED) 0.5-1-0.5 % SOLN Apply 1 drop to eye 4 (four) times daily.    Past Week at Unknown time  . cholecalciferol (VITAMIN D) 1000 UNITS tablet Take 1,000 Units by mouth daily. (0800)   Past Week at Unknown time  . cycloSPORINE (RESTASIS) 0.05 % ophthalmic emulsion Place 1 drop into both eyes 2 (two) times daily.    Past Week at Unknown time  . Dulaglutide (TRULICITY) 3 WE/3.1VQ SOPN Inject 3 mg into the skin every Friday.    04/09/2020 at Unknown time  . DULoxetine (CYMBALTA) 30 MG capsule Take 30 mg by mouth daily. (0800) Give along with 60 mg to = 90 mg   04/10/2020 at Unknown time  . DULoxetine (CYMBALTA) 60 MG capsule Take 60 mg by mouth daily. (0800)Give along with 30 mg to = 90 mg   04/10/2020 at Unknown time  . furosemide (LASIX) 40 MG tablet Take 40 mg by mouth daily. (0800)   Past Week at Unknown time  . gabapentin (NEURONTIN) 400 MG capsule Take 400 mg by mouth 3 (three) times daily. (0800, 1400, & 2000)   04/10/2020 at Unknown time  . guaiFENesin (MUCINEX) 600 MG 12 hr tablet Take 1,200 mg by mouth at bedtime. (2000)   04/09/2020 at Unknown time  . Insulin  Glargine (LANTUS SOLOSTAR) 100 UNIT/ML Solostar Pen Inject 18 Units into the skin at bedtime.    04/09/2020 at Unknown time  . loratadine (CLARITIN) 10 MG tablet Take 10 mg by mouth daily.     . methimazole (TAPAZOLE) 5 MG tablet Take 1 tablet (5 mg total) by mouth daily. 30 tablet 5 04/10/2020 at Unknown time  . metoprolol succinate (TOPROL-XL) 25 MG 24 hr tablet Take  25 mg by mouth in the morning and at bedtime. (0800 & 2100)   04/10/2020 at 0630  . Multiple Vitamin (MULTIVITAMIN WITH MINERALS) TABS tablet Take 1 tablet by mouth daily. (0800)   04/09/2020 at Unknown time  . pantoprazole (PROTONIX) 40 MG tablet Take 40 mg by mouth daily at 6 (six) AM.    04/10/2020 at Unknown time  . potassium chloride (KLOR-CON) 10 MEQ tablet Take 10 mEq by mouth every morning.   04/09/2020 at Unknown time  . predniSONE (DELTASONE) 2.5 MG tablet Take 2.5 mg by mouth every other day. (0800) Alternating days with 5 mg dose   04/09/2020 at Unknown time  . predniSONE (DELTASONE) 5 MG tablet Take 5 mg by mouth every other day. (0800) Alternating days with 2.5 mg dose   04/10/2020 at Unknown time  . Skin Protectants, Misc. (MINERIN) CREA Apply 1 application topically 2 (two) times daily.    Past Week at Unknown time  . Sodium Phosphates (RA SALINE ENEMA RE) Place 1 enema rectally daily as needed (constipation).     . solifenacin (VESICARE) 10 MG tablet Take 10 mg by mouth daily. (0800)   04/09/2020 at Unknown time  . spironolactone (ALDACTONE) 100 MG tablet Take 100 mg by mouth daily. (0800)   Past Week at Unknown time  . TECFIDERA 240 MG CPDR Take 1 capsule (240 mg total) by mouth 2 (two) times daily. 60 capsule 11 04/10/2020 at Unknown time  . bisacodyl (DULCOLAX) 10 MG suppository Place 10 mg rectally as needed for moderate constipation.   More than a month at Unknown time  . magnesium hydroxide (MILK OF MAGNESIA) 400 MG/5ML suspension Take 30 mLs by mouth daily as needed for mild constipation.   More than a month at  Unknown time  . magnesium oxide (MAG-OX) 400 MG tablet Take 400 mg by mouth every morning. (1000)   More than a month at Unknown time  . ondansetron (ZOFRAN) 4 MG tablet Take 4 mg by mouth every 6 (six) hours as needed for nausea or vomiting.   More than a month at Unknown time  . polyethylene glycol (MIRALAX / GLYCOLAX) packet Take 17 g by mouth daily as needed (constipation.).    More than a month at Unknown time   Allergies:  Allergies  Allergen Reactions  . Codeine Other (See Comments)    Difficult breathing and skin problem  . Ultram [Tramadol] Other (See Comments)    Difficult breathing and skin peeling  . Januvia [Sitagliptin] Rash and Other (See Comments)    Blisters    Family History  Problem Relation Age of Onset  . Multiple sclerosis Other        neices.   . Cancer Mother   . Diabetes Mother   . GI Bleed Sister        diverticulitis  . Thyroid disease Neg Hx    Social History:  reports that she has never smoked. She has never used smokeless tobacco. She reports that she does not drink alcohol and does not use drugs.  ROS: A complete review of systems was performed.  All systems are negative except for pertinent findings as noted. Review of Systems  Neurological: Positive for weakness.  All other systems reviewed and are negative.    Physical Exam:  Vital signs in last 24 hours: Temp:  [98.1 F (36.7 C)] 98.1 F (36.7 C) (10/11 0925) Pulse Rate:  [89] 89 (10/11 0925) Resp:  [18] 18 (10/11 0925) BP: (118)/(76) 118/76 (10/11  8101) SpO2:  [96 %] 96 % (10/11 0925) Weight:  [79.7 kg] 79.7 kg (10/11 0925) General:  Alert and oriented, No acute distress HEENT: Normocephalic, atraumatic Cardiovascular: Regular rate and rhythm Lungs: Regular rate and effort Abdomen: Soft, nontender, nondistended, no abdominal masses, 16 French G-tube in place for a suprapubic tube Back: No CVA tenderness Extremities: No edema Neurologic: Grossly intact  Laboratory Data:   Results for orders placed or performed during the hospital encounter of 04/10/20 (from the past 24 hour(s))  Resp Panel by RT PCR (RSV, Flu A&B, Covid) - Nasopharyngeal Swab     Status: None   Collection Time: 04/10/20  9:18 AM   Specimen: Nasopharyngeal Swab  Result Value Ref Range   SARS Coronavirus 2 by RT PCR NEGATIVE NEGATIVE   Influenza A by PCR NEGATIVE NEGATIVE   Influenza B by PCR NEGATIVE NEGATIVE   Respiratory Syncytial Virus by PCR NEGATIVE NEGATIVE  Basic metabolic panel per protocol     Status: Abnormal   Collection Time: 04/10/20 10:30 AM  Result Value Ref Range   Sodium 143 135 - 145 mmol/L   Potassium 4.4 3.5 - 5.1 mmol/L   Chloride 99 98 - 111 mmol/L   CO2 31 22 - 32 mmol/L   Glucose, Bld 108 (H) 70 - 99 mg/dL   BUN 17 8 - 23 mg/dL   Creatinine, Ser 0.60 0.44 - 1.00 mg/dL   Calcium 9.3 8.9 - 10.3 mg/dL   GFR, Estimated >60 >60 mL/min   Anion gap 13 5 - 15  CBC per protocol     Status: Abnormal   Collection Time: 04/10/20 10:30 AM  Result Value Ref Range   WBC 13.8 (H) 4.0 - 10.5 K/uL   RBC 4.29 3.87 - 5.11 MIL/uL   Hemoglobin 12.7 12.0 - 15.0 g/dL   HCT 39.6 36 - 46 %   MCV 92.3 80.0 - 100.0 fL   MCH 29.6 26.0 - 34.0 pg   MCHC 32.1 30.0 - 36.0 g/dL   RDW 16.4 (H) 11.5 - 15.5 %   Platelets 327 150 - 400 K/uL   nRBC 0.0 0.0 - 0.2 %   Recent Results (from the past 240 hour(s))  Resp Panel by RT PCR (RSV, Flu A&B, Covid) - Nasopharyngeal Swab     Status: None   Collection Time: 04/10/20  9:18 AM   Specimen: Nasopharyngeal Swab  Result Value Ref Range Status   SARS Coronavirus 2 by RT PCR NEGATIVE NEGATIVE Final    Comment: (NOTE) SARS-CoV-2 target nucleic acids are NOT DETECTED.  The SARS-CoV-2 RNA is generally detectable in upper respiratoy specimens during the acute phase of infection. The lowest concentration of SARS-CoV-2 viral copies this assay can detect is 131 copies/mL. A negative result does not preclude SARS-Cov-2 infection and should not be  used as the sole basis for treatment or other patient management decisions. A negative result may occur with  improper specimen collection/handling, submission of specimen other than nasopharyngeal swab, presence of viral mutation(s) within the areas targeted by this assay, and inadequate number of viral copies (<131 copies/mL). A negative result must be combined with clinical observations, patient history, and epidemiological information. The expected result is Negative.  Fact Sheet for Patients:  PinkCheek.be  Fact Sheet for Healthcare Providers:  GravelBags.it  This test is no t yet approved or cleared by the [place] FDA and  has been authorized for detection and/or diagnosis of SARS-CoV-2 by FDA under an Emergency Use Authorization (EUA). This EUA  will remain  in effect (meaning this test can be used) for the duration of the COVID-19 declaration under Section 564(b)(1) of the Act, 21 U.S.C. section 360bbb-3(b)(1), unless the authorization is terminated or revoked sooner.     Influenza A by PCR NEGATIVE NEGATIVE Final   Influenza B by PCR NEGATIVE NEGATIVE Final    Comment: (NOTE) The Xpert Xpress SARS-CoV-2/FLU/RSV assay is intended as an aid in  the diagnosis of influenza from Nasopharyngeal swab specimens and  should not be used as a sole basis for treatment. Nasal washings and  aspirates are unacceptable for Xpert Xpress SARS-CoV-2/FLU/RSV  testing.  Fact Sheet for Patients: PinkCheek.be  Fact Sheet for Healthcare Providers: GravelBags.it  This test is not yet approved or cleared by the [place] FDA and  has been authorized for detection and/or diagnosis of SARS-CoV-2 by  FDA under an Emergency Use Authorization (EUA). This EUA will remain  in effect (meaning this test can be used) for the duration of the  Covid-19 declaration under Section  564(b)(1) of the Act, 21  U.S.C. section 360bbb-3(b)(1), unless the authorization is  terminated or revoked.    Respiratory Syncytial Virus by PCR NEGATIVE NEGATIVE Final    Comment: (NOTE) Fact Sheet for Patients: PinkCheek.be  Fact Sheet for Healthcare Providers: GravelBags.it  This test is not yet approved or cleared by the [place] FDA and  has been authorized for detection and/or diagnosis of SARS-CoV-2 by  FDA under an Emergency Use Authorization (EUA). This EUA will remain  in effect (meaning this test can be used) for the duration of the  COVID-19 declaration under Section 564(b)(1) of the Act, 21 U.S.C.  section 360bbb-3(b)(1), unless the authorization is terminated or  revoked. Performed at [facility], [place] [address] [place], [place] [address]    Creatinine: Recent Labs    04/10/20 1030  CREATININE 0.60    Impression/Assessment/plan:   I discussed with the patient the nature, potential benefits, risks and alternatives to cystoscopy with Botox injection and suprapubic tube change, including side effects of the proposed treatment, the likelihood of the patient achieving the goals of the procedure, and any potential problems that might occur during the procedure or recuperation. All questions answered. Patient elects to proceed.    Festus Aloe 04/10/2020, 12:00 PM

## 2020-04-10 NOTE — Discharge Instructions (Signed)
Botulinum Toxin Bladder Injection  A botulinum toxin bladder injection is a procedure to treat an overactive bladder. During the procedure, a drug called botulinum toxin is injected into the bladder through a long, thin needle. This drug relaxes the bladder muscles and reduces overactivity. You may need this procedure if your medicines are not working or you cannot take them. The procedure may be repeated as needed. The treatment usually lasts for 6 months. Your health care provider will monitor you to see how well you respond. Tell a health care provider about:  Any allergies you have.  All medicines you are taking, including vitamins, herbs, eye drops, creams, and over-the-counter medicines.  Any problems you or family members have had with anesthetic medicines.  Any blood disorders you have.  Any surgeries you have had.  Any medical conditions you have.  Any previous reactions to a botulinum toxin injection.  Any symptoms of urinary tract infection. These include chills, fever, a burning feeling when passing urine, and needing to pass urine often.  Whether you are pregnant or may be pregnant. What are the risks? Generally this is a safe procedure. However, problems may occur, including:  Not being able to pass urine. If this happens, you may need to have your bladder emptied with a thin tube inserted into your urethra (urinary catheter).  Bleeding.  Urinary tract infection.  Allergic reaction to the botulinum toxin.  Pain or burning when passing urine.  Damage to other structures or organs. What happens before the procedure? Staying hydrated Follow instructions from your health care provider about hydration, which may include:  Up to 2 hours before the procedure - you may continue to drink clear liquids, such as water, clear fruit juice, black coffee, and plain tea. Eating and drinking restrictions Follow instructions from your health care provider about eating and  drinking, which may include:  8 hours before the procedure - stop eating heavy meals or foods, such as meat, fried foods, or fatty foods.  6 hours before the procedure - stop eating light meals or foods, such as toast or cereal.  6 hours before the procedure - stop drinking milk or drinks that contain milk.  2 hours before the procedure - stop drinking clear liquids. Medicines Ask your health care provider about:  Changing or stopping your regular medicines. This is especially important if you are taking diabetes medicines or blood thinners.  Taking medicines such as aspirin and ibuprofen. These medicines can thin your blood. Do not take these medicines unless your health care provider tells you to take them.  Taking over-the-counter medicines, vitamins, herbs, and supplements. General instructions  Plan to have someone take you home from the hospital or clinic.  If you will be going home right after the procedure, plan to have someone with you for 24 hours.  Ask your health care provider what steps will be taken to help prevent infection. These may include: ? Removing hair at the procedure site. ? Washing skin with a germ-killing soap. ? Antibiotic medicine. What happens during the procedure?   You will be asked to empty your bladder.  An IV will be inserted into one of your veins.  You will be given one or more of the following: ? A medicine to help you relax (sedative). ? A medicine to numb the area (local anesthetic). ? A medicine to make you fall asleep (general anesthetic).  A long, thin scope called a cystoscope will be passed into your bladder through the part   of the body that carries urine from your bladder (urethra).  The cystoscope will be used to fill your bladder with water.  A long needle will be passed through the cystoscope and into the bladder.  The botulinum toxin will be injected into your bladder. It may be injected into multiple areas of your  bladder.  Your bladder will be emptied, and the cystoscope will be removed. The procedure may vary among health care providers and hospitals. What can I expect after procedure? After your procedure, it is common to have:  Blood-tinged urine.  Burning or soreness when you pass urine. Follow these instructions at home: Medicines  Take over-the-counter and prescription medicines only as told by your health care provider.  If you were prescribed an antibiotic medicine, take it as told by your health care provider. Do not stop taking the antibiotic even if you start to feel better. General instructions   Do not drive for 24 hours if you were given a sedative during your procedure.  Drink enough fluid to keep your urine pale yellow.  Return to your normal activities as told by your health care provider. Ask your health care provider what activities are safe for you.  Keep all follow-up visits as told by your health care provider. This is important. Contact a health care provider if you have:  A fever or chills.  Blood-tinged urine for more than one day after your procedure.  Worsening pain or burning when you pass urine.  Pain or burning when passing urine for more than two days after your procedure.  Trouble emptying your bladder. Get help right away if you:  Have bright red blood in your urine.  Are unable to pass urine. Summary  A botulinum toxin bladder injection is a procedure to treat an overactive bladder.  This is generally a very safe procedure. However, problems may occur, including not being able to pass urine, bleeding, infection, pain, and allergic reactions to medicines.  You will be told when to stop eating and drinking, and what medicines to change or stop. Follow instructions carefully.  After the procedure, it is common to have blood in urine and to have soreness or burning when passing urine.  Contact a health care provider if you have a fever, have  blood in urine for more than a few days, or have trouble passing urine. Get help right away if you have bright red blood in the urine, or if you are unable to pass urine. This information is not intended to replace advice given to you by your health care provider. Make sure you discuss any questions you have with your health care provider. Document Revised: 12/26/2017 Document Reviewed: 12/26/2017 Elsevier Patient Education  2020 [place] Anesthesia, Adult, Care After This sheet gives you information about how to care for yourself after your procedure. Your health care provider may also give you more specific instructions. If you have problems or questions, contact your health care provider. What can I expect after the procedure? After the procedure, the following side effects are common:  Pain or discomfort at the IV site.  Nausea.  Vomiting.  Sore throat.  Trouble concentrating.  Feeling cold or chills.  Weak or tired.  Sleepiness and fatigue.  Soreness and body aches. These side effects can affect parts of the body that were not involved in surgery. Follow these instructions at home:  For at least 24 hours after the procedure:  Have a responsible adult stay with you. It is important to have someone help care for you until you are awake and alert.  Rest as needed.  Do not: ? Participate in activities in which you could fall or become injured. ? Drive. ? Use heavy machinery. ? Drink alcohol. ? Take sleeping pills or medicines that cause drowsiness. ? Make important decisions or sign legal documents. ? Take care of children on your own. Eating and drinking  Follow any instructions from your health care provider about eating or drinking restrictions.  When you feel hungry, start by eating small amounts of foods that are soft and easy to digest (bland), such as toast. Gradually return to your regular diet.  Drink enough fluid to keep your  urine pale yellow.  If you vomit, rehydrate by drinking water, juice, or clear broth. General instructions  If you have sleep apnea, surgery and certain medicines can increase your risk for breathing problems. Follow instructions from your health care provider about wearing your sleep device: ? Anytime you are sleeping, including during daytime naps. ? While taking prescription pain medicines, sleeping medicines, or medicines that make you drowsy.  Return to your normal activities as told by your health care provider. Ask your health care provider what activities are safe for you.  Take over-the-counter and prescription medicines only as told by your health care provider.  If you smoke, do not smoke without supervision.  Keep all follow-up visits as told by your health care provider. This is important. Contact a health care provider if:  You have nausea or vomiting that does not get better with medicine.  You cannot eat or drink without vomiting.  You have pain that does not get better with medicine.  You are unable to pass urine.  You develop a skin rash.  You have a fever.  You have redness around your IV site that gets worse. Get help right away if:  You have difficulty breathing.  You have chest pain.  You have blood in your urine or stool, or you vomit blood. Summary  After the procedure, it is common to have a sore throat or nausea. It is also common to feel tired.  Have a responsible adult stay with you for the first 24 hours after general anesthesia. It is important to have someone help care for you until you are awake and alert.  When you feel hungry, start by eating small amounts of foods that are soft and easy to digest (bland), such as toast. Gradually return to your regular diet.  Drink enough fluid to keep your urine pale yellow.  Return to your normal activities as told by your health care provider. Ask your health care provider what activities are safe  for you. This information is not intended to replace advice given to you by your health care provider. Make sure you discuss any questions you have with your health care provider. Document Revised: 06/20/2017 Document Reviewed: 01/31/2017 Elsevier Patient Education  [place].

## 2020-04-10 NOTE — Anesthesia Preprocedure Evaluation (Signed)
Anesthesia Evaluation  Patient identified by MRN, date of birth, ID band Patient awake    Reviewed: Allergy & Precautions, NPO status , Patient's Chart, lab work & pertinent test results  Airway Mallampati: II  TM Distance: >3 FB Neck ROM: Full    Dental   Pulmonary    Pulmonary exam normal        Cardiovascular hypertension, Pt. on medications Normal cardiovascular exam     Neuro/Psych Depression    GI/Hepatic GERD  Medicated and Controlled,  Endo/Other  diabetes, Type 2, Oral Hypoglycemic Agents  Renal/GU      Musculoskeletal   Abdominal   Peds  Hematology   Anesthesia Other Findings   Reproductive/Obstetrics                             Anesthesia Physical Anesthesia Plan  ASA: III  Anesthesia Plan: General   Post-op Pain Management:    Induction: Intravenous  PONV Risk Score and Plan: 3 and Midazolam, Ondansetron and Treatment may vary due to age or medical condition  Airway Management Planned: LMA  Additional Equipment:   Intra-op Plan:   Post-operative Plan: Extubation in OR  Informed Consent: I have reviewed the patients History and Physical, chart, labs and discussed the procedure including the risks, benefits and alternatives for the proposed anesthesia with the patient or authorized representative who has indicated his/her understanding and acceptance.       Plan Discussed with: CRNA and Surgeon  Anesthesia Plan Comments:         Anesthesia Quick Evaluation

## 2020-04-11 ENCOUNTER — Encounter (HOSPITAL_COMMUNITY): Payer: Self-pay | Admitting: Urology

## 2020-04-13 ENCOUNTER — Non-Acute Institutional Stay (SKILLED_NURSING_FACILITY): Payer: Medicare Other | Admitting: Internal Medicine

## 2020-04-13 ENCOUNTER — Encounter: Payer: Self-pay | Admitting: Internal Medicine

## 2020-04-13 ENCOUNTER — Encounter: Payer: Self-pay | Admitting: Adult Health

## 2020-04-13 DIAGNOSIS — E1140 Type 2 diabetes mellitus with diabetic neuropathy, unspecified: Secondary | ICD-10-CM | POA: Diagnosis not present

## 2020-04-13 DIAGNOSIS — L299 Pruritus, unspecified: Secondary | ICD-10-CM

## 2020-04-13 DIAGNOSIS — D72829 Elevated white blood cell count, unspecified: Secondary | ICD-10-CM | POA: Diagnosis not present

## 2020-04-13 DIAGNOSIS — Z9359 Other cystostomy status: Secondary | ICD-10-CM

## 2020-04-13 DIAGNOSIS — Z794 Long term (current) use of insulin: Secondary | ICD-10-CM

## 2020-04-13 NOTE — Patient Instructions (Signed)
See assessment and plan under each diagnosis in the problem list and acutely for this visit 

## 2020-04-13 NOTE — Progress Notes (Signed)
NURSING HOME LOCATION:  [facility] ROOM NUMBER:  [id number]  CODE STATUS:  DNR  PCP:  [name], MD  [place] [id number]  This is a nursing facility follow up of chronic medical diagnoses.   Interim medical record and care since last [place] visit was updated with review of diagnostic studies and change in clinical status since last visit were documented.  HPI: She is a permanent resident facility with medical diagnoses of spinal stenosis involving the cervical, thoracic as well as lumbosacral regions.  MS is associated with paraplegia of the lower extremities.  Other medical diagnoses include GERD, gout, dyslipidemia, hypothyroidism, and diabetes with neuropathy. [facility] NP had decreased the insulin dose approximately 3 weeks ago. Dr. Festus Aloe, Urology exchanged the suprapubic catheter and performed cystoscopy and Botox injection 82/99/3716 without complication.  Macrobid twice daily was initiated preop 10/6; duration of therapy is unclear.  White blood count was 13,800 preop.  Review of systems:   Claritin had been prescribed apparently for presumed rhinitis. Actually she describes diffuse facial itching which she relates to dry scalp and skin. She has been using a moisturizer for several days with good response. Despite the leukocytosis she denies any signs or symptoms of infection. She denies any new diabetic symptoms. . Constitutional: No fever, significant weight change, fatigue  Eyes: No redness, discharge, pain, vision change ENT/mouth: No nasal congestion,  purulent discharge, earache, change in hearing, sore throat  Cardiovascular: No chest pain, palpitations, paroxysmal nocturnal dyspnea, edema  Respiratory: No cough, sputum production, hemoptysis, DOE, significant snoring, apnea   Gastrointestinal: No heartburn, dysphagia, abdominal pain, nausea /vomiting, rectal bleeding, melena, change in bowels Genitourinary: No  dysuria, hematuria, pyuria Musculoskeletal: No joint stiffness, joint swelling, weakness, pain Dermatologic: No rash, pruritus, change in appearance of skin Neurologic: No dizziness, headache, syncope, seizures Psychiatric: No significant anxiety, depression, insomnia, anorexia Endocrine: No change in hair/skin/nails, excessive thirst, excessive hunger, excessive urination  Hematologic/lymphatic: No significant bruising, lymphadenopathy, abnormal bleeding Allergy/immunology: No itchy/watery eyes, significant sneezing, urticaria, angioedema  Physical exam:  Pertinent or positive findings: Morbid obesity is present. She is missing multiple mandibular teeth. Abdomen is protuberant. Pedal pulses are decreased. Interosseous wasting of the hands is present. She has no range of motion in the lower extremities. The right upper extremity is stronger than the left upper extremity. Clubbing of the nailbeds is suggested. Clinically excessive drying of the face is not present.  General appearance: Adequately nourished; no acute distress, increased work of breathing is present.   Lymphatic: No lymphadenopathy about the head, neck, axilla. Eyes: No conjunctival inflammation or lid edema is present. There is no scleral icterus. Ears:  External ear exam shows no significant lesions or deformities.   Nose:  External nasal examination shows no deformity or inflammation. Nasal mucosa are pink and moist without lesions, exudates Oral exam:  Lips and gums are healthy appearing. There is no oropharyngeal erythema or exudate. Neck:  No thyromegaly, masses, tenderness noted.    Heart:  Normal rate and regular rhythm. S1 and S2 normal without gallop, murmur, click, rub .  Lungs: Chest clear to auscultation without wheezes, rhonchi, rales, rubs. Abdomen: Bowel sounds are normal. Abdomen is soft and nontender with no organomegaly, hernias, masses. GU: Deferred  Extremities:  No cyanosis, edema  Neurologic exam :Balance,  Rhomberg, finger to nose testing could not be completed due to clinical state Skin: Warm & dry w/o tenting. No significant lesions or rash.  See summary under  each active problem in the Problem List with associated updated therapeutic plan

## 2020-04-13 NOTE — Progress Notes (Signed)
This encounter was created in error - please disregard.

## 2020-04-13 NOTE — Progress Notes (Deleted)
   NURSING HOME LOCATION:  [facility] ROOM NUMBER:  [id number]  CODE STATUS:  DNR  PCP:  [name], MD  [place] [id number]    This is a nursing facility follow up of chronic medical diagnoses.  Interim medical record and care since last [place] visit was updated with review of diagnostic studies and change in clinical status since last visit were documented.  HPI:  Review of systems: Dementia invalidated responses. Date given as   Constitutional: No fever, significant weight change, fatigue  Eyes: No redness, discharge, pain, vision change ENT/mouth: No nasal congestion,  purulent discharge, earache, change in hearing, sore throat  Cardiovascular: No chest pain, palpitations, paroxysmal nocturnal dyspnea, claudication, edema  Respiratory: No cough, sputum production, hemoptysis, DOE, significant snoring, apnea   Gastrointestinal: No heartburn, dysphagia, abdominal pain, nausea /vomiting, rectal bleeding, melena, change in bowels Genitourinary: No dysuria, hematuria, pyuria, incontinence, nocturia Musculoskeletal: No joint stiffness, joint swelling, weakness, pain Dermatologic: No rash, pruritus, change in appearance of skin Neurologic: No dizziness, headache, syncope, seizures, numbness, tingling Psychiatric: No significant anxiety, depression, insomnia, anorexia Endocrine: No change in hair/skin/nails, excessive thirst, excessive hunger, excessive urination  Hematologic/lymphatic: No significant bruising, lymphadenopathy, abnormal bleeding Allergy/immunology: No itchy/watery eyes, significant sneezing, urticaria, angioedema  Physical exam:  Pertinent or positive findings: General appearance: Adequately nourished; no acute distress, increased work of breathing is present.   Lymphatic: No lymphadenopathy about the head, neck, axilla. Eyes: No conjunctival inflammation or lid edema is present. There is no scleral icterus. Ears:   External ear exam shows no significant lesions or deformities.   Nose:  External nasal examination shows no deformity or inflammation. Nasal mucosa are pink and moist without lesions, exudates Oral exam:  Lips and gums are healthy appearing. There is no oropharyngeal erythema or exudate. Neck:  No thyromegaly, masses, tenderness noted.    Heart:  Normal rate and regular rhythm. S1 and S2 normal without gallop, murmur, click, rub .  Lungs: Chest clear to auscultation without wheezes, rhonchi, rales, rubs. Abdomen: Bowel sounds are normal. Abdomen is soft and nontender with no organomegaly, hernias, masses. GU: Deferred  Extremities:  No cyanosis, clubbing, edema  Neurologic exam : Cn 2-7 intact Strength equal  in upper & lower extremities Balance, Rhomberg, finger to nose testing could not be completed due to clinical state Deep tendon reflexes are equal Skin: Warm & dry w/o tenting. No significant lesions or rash.  See summary under each active problem in the Problem List with associated updated therapeutic plan

## 2020-04-13 NOTE — Assessment & Plan Note (Signed)
She has no extrinsic symptoms; the Claritin is probably superfluous as she is getting subjective benefit from the topical moisturizing agent.

## 2020-04-13 NOTE — Assessment & Plan Note (Signed)
04/10/2020 Dr. Elie Goody exchange of suprapubic catheter and perform cystoscopy and Botox injection.  Macrobid twice daily was initiated preop 10/6; duration of therapy will be ascertained.

## 2020-04-13 NOTE — Assessment & Plan Note (Signed)
04/10/2020 white blood count 13,800. Reviewing the chart reveals that her white count has varied from a low of 11,000 up to a high of 14,100. She has no signs or symptoms of infectious process at this time. The most likely etiology of the chronic leukocytosis is chronic leukocytosis typically found among African-Americans without active associated infection.

## 2020-04-13 NOTE — Assessment & Plan Note (Signed)
Fasting glucoses range from 92 up to 184.  The latter is an outlier as the majority are less than 150.  Evening glucoses range from 10 9-1 77.  [facility] NP has decreased insulin dosage.

## 2020-04-18 ENCOUNTER — Other Ambulatory Visit: Payer: Self-pay

## 2020-04-18 ENCOUNTER — Non-Acute Institutional Stay: Payer: Medicare Other | Admitting: Hospice

## 2020-04-18 DIAGNOSIS — Z515 Encounter for palliative care: Secondary | ICD-10-CM

## 2020-04-18 DIAGNOSIS — G35 Multiple sclerosis: Secondary | ICD-10-CM

## 2020-04-18 NOTE — Progress Notes (Signed)
[facility] Consult Note Telephone: [phone]  Fax: [phone]  PATIENT NAME: Laura Roberts DOB: 10-20-1940 MRN: [id number]  PRIMARY CARE PROVIDER:   [name], MD [name], [address] [place] [address] [place],  [place] [address]  REFERRING PROVIDER: [name], MD [name], [place],  [place] [address]  RESPONSIBLE PARTY:Self Extended Emergency Contact Information Primary Emergency Contact: Dorer,Leroy III [place], [place] Phone: [phone] Relation: Son Secondary Emergency Contact: Salli Quarry [place] Phone: [phone] Mobile Phone: [phone] Relation: Son   RECOMMENDATIONS/PLAN:  Advance Care Planning: Follow up visit consisted of building trust and discussions on Palliative Medicine as specialized medical care for people living with serious illness, aimed at facilitating better quality of life through symptoms relief, assisting with advance care plan and establishing goals of care.  Patient endorsed proactive planning because she believes it is wrong to leave the responsibility to the children.  Code Status: Patient affirmed she is a DNR. Signed DNR in facility chart and in [facility].  Goals of Care: Goals of care include to maximize quality of life and symptom management. MOST form selections  include DO NOT RESUSCITATE, limited additional interventions, IV fluids as indicated, antibiotics as indicated, no feeding tube.  Signed MOST form in facility chart; same document uploaded to epic today. Visit consisted of counseling and education dealing with the complex and emotionally intense issues of symptom management and palliative care in the setting of serious and potentially life-threatening illness. Palliative medicine will continue to support patient, patient's family and the medical team.   Follow  DT:[id number] care will continue to follow patient for goals of care clarification and symptom management.  Follow-up in 2 months  Symptom management: Incontinence/Neurogenic bladder related to multiple sclerosis; currently managed with botox injection and suprapubic cath maintained by [facility]- last encounter 04/10/2020.  She remains on Tecfidera for MS with no adverse reactions. Patient denied spasm at this time; spasmwell managed with neurontin and baclofen. Patientisbedbound, hoyer lift for transfers, able to feed self after set up. She denies new numbness or weakness or changes to the bowels or bladder. Nursing staff with no concerns. Paraplegia of the lower extremities associated with MS. Encouraged ongoing care.   From previous visit 12/07/2019: Type 2 DM managed with Trulicity and Lantus. She denies moodiness, continues on Cymbalta for depression;  she participates in [place] and other facility activities. She loves to color and has some colored works on display in her room.   I spent 9minutes providing this consultation; time includes chart review and documentation. More than 50% of the time in this consultation was spent on coordinating communication  HISTORY OF PRESENT ILLNESS:HISTORY OF PRESENT ILLNESS:Laura M Smithis a 79 y.o.year oldfemalewith multiple medical problems including MS, paraplegia, depression, h/o fracture,  DM2, HTN, neurogenic pain of LE, suprapubic urinary catheter. Palliative Care was asked to help address goals of care.  CODE STATUS:DNR  PPS:30%  HOSPICE ELIGIBILITY/DIAGNOSIS: TBD  PAST MEDICAL HISTORY:  Past Medical History:  Diagnosis Date  . Age related osteoporosis   . Aphasia   . Bronchitis    hx of   . Cellulitis and abscess of toe    hx of   . Constipation   . Cystostomy in place [facility])   . Degenerative arthritis    osteoarthritis   . Depression   . Diabetes mellitus without complication ([place])    type 2   .  Edema    localized   . Full incontinence of feces   . Gait disturbance   . GERD (gastroesophageal reflux disease)   . Gout    hx of   . History of falling   . Hyperlipidemia   . Hypertension   . Hyperthyroidism   . Multiple sclerosis ([place])   . Neuromuscular disorder (HCC)    Bilateral hand carpal tunnel syndrome  . Neuromuscular dysfunction of bladder   . Nontoxic multinodular goiter   . Obesity   . Osteoporosis   . Other adrenocortical insufficiency ([place])   . Paraplegia (HCC)    LEGS  . Personal history of urinary (tract) infections   . Polyneuropathy   . Pressure ulcer of right buttock, stage 4 ([place])   . Spinal stenosis in cervical region   . Spinal stenosis of lumbosacral region   . Spinal stenosis, thoracic   . Tinea unguium   . Vitamin D deficiency     SOCIAL HX:  Social History   Tobacco Use  . Smoking status: Never Smoker  . Smokeless tobacco: Never Used  Substance Use Topics  . Alcohol use: No    ALLERGIES:  Allergies  Allergen Reactions  . Codeine Other (See Comments)    Difficult breathing and skin problem  . Ultram [Tramadol] Other (See Comments)    Difficult breathing and skin peeling  . Januvia [Sitagliptin] Rash and Other (See Comments)    Blisters     PERTINENT MEDICATIONS:  Outpatient Encounter Medications as of 04/18/2020  Medication Sig  . acetaminophen (TYLENOL) 325 MG tablet Take 650 mg by mouth every 6 (six) hours as needed for mild pain or fever.   . baclofen (LIORESAL) 10 MG tablet Take 10-20 mg by mouth See admin instructions. (0800 & 1200) TAKE  1.5 tabs (15 mg)  BY MOUTH ONCE DAILY IN THE MORNING & TAKE 1 TABLET (10 MG) BY MOUTH AT NOON  . baclofen (LIORESAL) 20 MG tablet Take 20 mg by mouth at bedtime.   . barrier cream (NON-SPECIFIED) CREA Apply 1 application topically 2 (two) times daily. Apply to right ischium and bilateral buttocks  . bisacodyl (DULCOLAX) 10 MG suppository Place 10 mg rectally as needed for moderate constipation.   . calcium citrate (CALCITRATE - DOSED IN MG ELEMENTAL CALCIUM) 950 MG tablet Take 200 mg of elemental calcium by mouth daily. (0800)  . Carboxymeth-Glycerin-Polysorb (REFRESH OPTIVE ADVANCED) 0.5-1-0.5 % SOLN Apply 1 drop to eye 4 (four) times daily.   . cholecalciferol (VITAMIN D) 1000 UNITS tablet Take 1,000 Units by mouth daily. (0800)  . cycloSPORINE (RESTASIS) 0.05 % ophthalmic emulsion Place 1 drop into both eyes 2 (two) times daily.   . Dulaglutide (TRULICITY) 3 NA/3.5TD SOPN Inject 3 mg into the skin every Friday.   . DULoxetine (CYMBALTA) 30 MG capsule Take 30 mg by mouth daily. (0800) Give along with 60 mg to = 90 mg  . DULoxetine (CYMBALTA) 60 MG capsule Take 60 mg by mouth daily. (0800)Give along with 30 mg to = 90 mg  . furosemide (LASIX) 40 MG tablet Take 40 mg by mouth daily. (0800)  . gabapentin (NEURONTIN) 400 MG capsule Take 400 mg by mouth 3 (three) times daily. (0800, 1400, & 2000)  . guaiFENesin (MUCINEX) 600 MG 12 hr tablet Take 1,200 mg by mouth at bedtime. (2000)  . Insulin Glargine (LANTUS SOLOSTAR) 100 UNIT/ML Solostar Pen Inject 18 Units into the skin at bedtime.   Marland Kitchen loratadine (CLARITIN) 10 MG tablet Take 10  mg by mouth daily. (Patient not taking: Reported on 04/13/2020)  . magnesium hydroxide (MILK OF MAGNESIA) 400 MG/5ML suspension Take 30 mLs by mouth daily as needed for mild constipation.  . magnesium oxide (MAG-OX) 400 MG tablet Take 400 mg by mouth every morning. (1000)  . methimazole (TAPAZOLE) 5 MG tablet Take 1 tablet (5 mg total) by mouth daily.  . metoprolol succinate (TOPROL-XL) 25 MG 24 hr tablet Take 25 mg by mouth in the morning and at bedtime. (0800 & 2100)  . Multiple Vitamin (MULTIVITAMIN WITH MINERALS) TABS tablet Take 1 tablet by mouth daily. (0800)  . ondansetron (ZOFRAN) 4 MG tablet Take 4 mg by mouth every 6 (six) hours as needed for nausea or vomiting.  . pantoprazole (PROTONIX) 40 MG tablet Take 40 mg by mouth daily at 6 (six) AM.   .  polyethylene glycol (MIRALAX / GLYCOLAX) packet Take 17 g by mouth daily as needed (constipation.).   Marland Kitchen potassium chloride (KLOR-CON) 10 MEQ tablet Take 10 mEq by mouth every morning.  . predniSONE (DELTASONE) 2.5 MG tablet Take 2.5 mg by mouth every other day. (0800) Alternating days with 5 mg dose  . predniSONE (DELTASONE) 5 MG tablet Take 5 mg by mouth every other day. (0800) Alternating days with 2.5 mg dose  . Skin Protectants, Misc. (MINERIN) CREA Apply 1 application topically 2 (two) times daily.   . Sodium Phosphates (RA SALINE ENEMA RE) Place 1 enema rectally daily as needed (constipation).  . solifenacin (VESICARE) 10 MG tablet Take 10 mg by mouth daily. (0800)  . spironolactone (ALDACTONE) 100 MG tablet Take 100 mg by mouth daily. (0800)  . TECFIDERA 240 MG CPDR Take 1 capsule (240 mg total) by mouth 2 (two) times daily.   No facility-administered encounter medications on file as of 04/18/2020.    PHYSICAL EXAM/ROS:  General:pleasant, cooperative Cardiovascular: regular rate and rhythm; denies chest pain Pulmonary:lung sounds clear, normal respiratory effort95% RA; no adventitious sounds auscultated Abdomen: soft,nontender, + bowel sounds, bms WNL XB:[id number] catheter in place with clear yellow urine noted in bag Extremities:No edema, MS induced joint deformities-not walked in >3 years. Skin: no rashes, no woundsto visible skin Neurological: Weakness, alert and oriented x 3   Teodoro Spray, NP

## 2020-06-08 ENCOUNTER — Inpatient Hospital Stay: Admit: 2020-06-08 | Payer: Medicare Other | Admitting: Orthopedic Surgery

## 2020-06-08 SURGERY — INSERTION, INTRAMEDULLARY ROD, FEMUR
Anesthesia: Choice | Laterality: Left

## 2020-06-13 ENCOUNTER — Other Ambulatory Visit (HOSPITAL_COMMUNITY): Payer: Self-pay | Admitting: Internal Medicine

## 2020-06-13 ENCOUNTER — Other Ambulatory Visit: Payer: Self-pay

## 2020-06-13 ENCOUNTER — Ambulatory Visit (HOSPITAL_COMMUNITY)
Admission: RE | Admit: 2020-06-13 | Discharge: 2020-06-13 | Disposition: A | Discharge status: Home / Self Care | Payer: Medicare Other | Source: Ambulatory Visit | Attending: Internal Medicine | Admitting: Internal Medicine

## 2020-06-13 DIAGNOSIS — Z9359 Other cystostomy status: Secondary | ICD-10-CM

## 2020-06-13 DIAGNOSIS — Z435 Encounter for attention to cystostomy: Secondary | ICD-10-CM | POA: Diagnosis not present

## 2020-06-13 HISTORY — PX: IR CATHETER TUBE CHANGE: IMG717

## 2020-06-13 MED ORDER — LIDOCAINE VISCOUS HCL 2 % MT SOLN
OROMUCOSAL | Status: AC
  Filled 2020-06-13: qty 15

## 2020-06-13 MED ORDER — LIDOCAINE VISCOUS HCL 2 % MT SOLN
OROMUCOSAL | Status: AC | PRN
  Administered 2020-06-13: 15 mL via ORAL

## 2020-06-13 MED ORDER — IOHEXOL 300 MG/ML  SOLN
50.0000 mL | Freq: Once | INTRAMUSCULAR | Status: AC | PRN
  Administered 2020-06-13: 15 mL

## 2020-06-13 NOTE — Procedures (Signed)
Interventional Radiology Procedure Note   HPI:  Ms Laura Roberts had CT guided supra-pubic catheter placed 10/28/19.  She tells me that today was the first time that her staff at her facility performed attempt at bedside exchange of the 28F foley.  This failed, when they were unable to get new foley in place.  They placed a trans-uretheral catheter, and sent for rescue to VIR.   Procedure: Rescue of the supra-pubic catheter, with new 28F entuit balloon retention.  This required 45F and 54F dilation of the tract, and coaxial 54F sheath introducer through the gtube. .   Complications: None  Recommendations:  - To gravity - Do not submerge  - Next exchange should be at least 6-8 weeks, performed in VIR with moderate sedation, as the tissue tract will require 28F, 57F, perhaps 10F dilation, such that future foley exchanges may be made at the bedside.  There is a bit of scarring along the tract that clearly will need dilation  - Routine line care   Signed,  Laura Roberts. Laura Newport, DO

## 2020-06-28 ENCOUNTER — Other Ambulatory Visit (HOSPITAL_COMMUNITY): Payer: Self-pay | Admitting: Interventional Radiology

## 2020-06-28 DIAGNOSIS — Z9359 Other cystostomy status: Secondary | ICD-10-CM

## 2020-07-31 ENCOUNTER — Other Ambulatory Visit: Payer: Self-pay | Admitting: Radiology

## 2020-08-01 ENCOUNTER — Ambulatory Visit (HOSPITAL_COMMUNITY)
Admission: RE | Admit: 2020-08-01 | Discharge: 2020-08-01 | Disposition: A | Discharge status: Home / Self Care | Payer: Medicare Other | Source: Ambulatory Visit | Attending: Interventional Radiology | Admitting: Interventional Radiology

## 2020-08-01 ENCOUNTER — Encounter: Payer: Self-pay | Admitting: Internal Medicine

## 2020-08-01 ENCOUNTER — Other Ambulatory Visit: Payer: Self-pay

## 2020-08-01 ENCOUNTER — Non-Acute Institutional Stay (SKILLED_NURSING_FACILITY): Payer: Medicare Other | Admitting: Internal Medicine

## 2020-08-01 ENCOUNTER — Encounter (HOSPITAL_COMMUNITY): Payer: Self-pay

## 2020-08-01 ENCOUNTER — Other Ambulatory Visit (HOSPITAL_COMMUNITY): Payer: Self-pay | Admitting: Interventional Radiology

## 2020-08-01 DIAGNOSIS — L89890 Pressure ulcer of other site, unstageable: Secondary | ICD-10-CM

## 2020-08-01 DIAGNOSIS — G8220 Paraplegia, unspecified: Secondary | ICD-10-CM | POA: Diagnosis not present

## 2020-08-01 DIAGNOSIS — E46 Unspecified protein-calorie malnutrition: Secondary | ICD-10-CM | POA: Diagnosis not present

## 2020-08-01 DIAGNOSIS — E1140 Type 2 diabetes mellitus with diabetic neuropathy, unspecified: Secondary | ICD-10-CM

## 2020-08-01 DIAGNOSIS — Z79899 Other long term (current) drug therapy: Secondary | ICD-10-CM | POA: Insufficient documentation

## 2020-08-01 DIAGNOSIS — N319 Neuromuscular dysfunction of bladder, unspecified: Secondary | ICD-10-CM | POA: Insufficient documentation

## 2020-08-01 DIAGNOSIS — Z9359 Other cystostomy status: Secondary | ICD-10-CM

## 2020-08-01 DIAGNOSIS — Z435 Encounter for attention to cystostomy: Secondary | ICD-10-CM | POA: Insufficient documentation

## 2020-08-01 DIAGNOSIS — G35 Multiple sclerosis: Secondary | ICD-10-CM | POA: Insufficient documentation

## 2020-08-01 DIAGNOSIS — Z794 Long term (current) use of insulin: Secondary | ICD-10-CM

## 2020-08-01 HISTORY — PX: IR CATHETER TUBE CHANGE: IMG717

## 2020-08-01 LAB — CBC WITH DIFFERENTIAL/PLATELET
Abs Immature Granulocytes: 0.14 10*3/uL — ABNORMAL HIGH (ref 0.00–0.07)
Basophils Absolute: 0.1 10*3/uL (ref 0.0–0.1)
Basophils Relative: 1 %
Eosinophils Absolute: 0.3 10*3/uL (ref 0.0–0.5)
Eosinophils Relative: 3 %
HCT: 42.8 % (ref 36.0–46.0)
Hemoglobin: 13.9 g/dL (ref 12.0–15.0)
Immature Granulocytes: 1 %
Lymphocytes Relative: 18 %
Lymphs Abs: 2 10*3/uL (ref 0.7–4.0)
MCH: 29.2 pg (ref 26.0–34.0)
MCHC: 32.5 g/dL (ref 30.0–36.0)
MCV: 89.9 fL (ref 80.0–100.0)
Monocytes Absolute: 1.1 10*3/uL — ABNORMAL HIGH (ref 0.1–1.0)
Monocytes Relative: 10 %
Neutro Abs: 7.7 10*3/uL (ref 1.7–7.7)
Neutrophils Relative %: 67 %
Platelets: 322 10*3/uL (ref 150–400)
RBC: 4.76 MIL/uL (ref 3.87–5.11)
RDW: 15.1 % (ref 11.5–15.5)
WBC: 11.4 10*3/uL — ABNORMAL HIGH (ref 4.0–10.5)
nRBC: 0 % (ref 0.0–0.2)

## 2020-08-01 LAB — PROTIME-INR
INR: 1 (ref 0.8–1.2)
Prothrombin Time: 13.2 seconds (ref 11.4–15.2)

## 2020-08-01 LAB — BASIC METABOLIC PANEL
Anion gap: 14 (ref 5–15)
BUN: 35 mg/dL — ABNORMAL HIGH (ref 8–23)
CO2: 28 mmol/L (ref 22–32)
Calcium: 10.2 mg/dL (ref 8.9–10.3)
Chloride: 97 mmol/L — ABNORMAL LOW (ref 98–111)
Creatinine, Ser: 0.92 mg/dL (ref 0.44–1.00)
GFR, Estimated: >60 mL/min (ref >60)
Glucose, Bld: 88 mg/dL (ref 70–99)
Potassium: 4.4 mmol/L (ref 3.5–5.1)
Sodium: 139 mmol/L (ref 135–145)

## 2020-08-01 LAB — MAGNESIUM: Magnesium: 1.7

## 2020-08-01 LAB — GLUCOSE, CAPILLARY: Glucose-Capillary: 86 mg/dL (ref 70–99)

## 2020-08-01 MED ORDER — IOHEXOL 300 MG/ML  SOLN
50.0000 mL | Freq: Once | INTRAMUSCULAR | Status: AC | PRN
  Administered 2020-08-01: 20 mL

## 2020-08-01 MED ORDER — FENTANYL CITRATE (PF) 100 MCG/2ML IJ SOLN
INTRAMUSCULAR | Status: AC
  Filled 2020-08-01: qty 2

## 2020-08-01 MED ORDER — LIDOCAINE VISCOUS HCL 2 % MT SOLN
OROMUCOSAL | Status: AC
  Filled 2020-08-01: qty 15

## 2020-08-01 MED ORDER — MIDAZOLAM HCL 2 MG/2ML IJ SOLN
INTRAMUSCULAR | Status: AC | PRN
Start: 2020-08-01 — End: 2020-08-01
  Administered 2020-08-01: 1 mg via INTRAVENOUS

## 2020-08-01 MED ORDER — LIDOCAINE VISCOUS HCL 2 % MT SOLN
OROMUCOSAL | Status: AC
  Administered 2020-08-01: 4 mL
  Filled 2020-08-01: qty 15

## 2020-08-01 MED ORDER — LIDOCAINE HCL 1 % IJ SOLN
INTRAMUSCULAR | Status: AC
  Filled 2020-08-01: qty 20

## 2020-08-01 MED ORDER — FENTANYL CITRATE (PF) 100 MCG/2ML IJ SOLN
INTRAMUSCULAR | Status: AC | PRN
  Administered 2020-08-01: 50 ug via INTRAVENOUS

## 2020-08-01 MED ORDER — MIDAZOLAM HCL 2 MG/2ML IJ SOLN
INTRAMUSCULAR | Status: AC
  Filled 2020-08-01: qty 2

## 2020-08-01 MED ORDER — SODIUM CHLORIDE 0.9 % IV SOLN: INTRAVENOUS | Status: DC

## 2020-08-01 NOTE — Assessment & Plan Note (Addendum)
PT/OT at [facility] periodically assesses the patient for restorative therapy.  She has been going to the gym on a regular basis. She reports that she had a pelvic fracture in November 2021 while in PT/OT.  Orthopedically she was seen by Dr. Noemi Chapel & Percell Miller.  This raises the possibility that she has advanced osteoporosis.  Performance of a DEXA scan will be discussed with the [facility] NP.

## 2020-08-01 NOTE — Patient Instructions (Signed)
See assessment and plan under each diagnosis in the problem list and acutely for this visit 

## 2020-08-01 NOTE — Assessment & Plan Note (Signed)
Ongoing monitor by Wound Care Nurse at [facility] monitor

## 2020-08-01 NOTE — Assessment & Plan Note (Signed)
In June 2021 albumin was slightly reduced at 3.5; total protein was 6.3.  Clinically she is not malnourished.

## 2020-08-01 NOTE — Assessment & Plan Note (Addendum)
Glucoses on record have ranged from a low of 78 up to a high of 150.  A1c is not current but it had been 5.8% indicating prediabetes.

## 2020-08-01 NOTE — Procedures (Signed)
Interventional Radiology Procedure Note  Procedure: Exchange of suprapubic tube  Complications: None  Estimated Blood Loss: None  Findings: New 18 Fr Council-tip Foley catheter placed in bladder over wire after tract dilatation to 20 Fr. Tip in bladder lumen. Attached to gravity drainage bag.  Venetia Night. Kathlene Cote, M.D Pager:  [phone]

## 2020-08-01 NOTE — Progress Notes (Signed)
NURSING HOME LOCATION:  ROOM NUMBER: [id number]   CODE STATUS:DNR    PCP: [name], MD   This is a nursing facility follow up visit of chronic medical diagnoses  Interim medical record and care since last [place] visit was updated with review of diagnostic studies and change in clinical status since last visit were documented.  HPI: She is a permanent resident of the facility with medical diagnoses of cervical, thoracic and lumbosacral spinal stenosis; paraplegia of the lower extremities; history of adrenal cortical insufficiency; history of nontoxic multinodular goiter; dyslipidemia; essential hypertension; GERD; and multiple sclerosis. She has had multiple suprapubic catheter interventions because of difficulty replacing the catheter due to scarring.An 18 Fr Council tip Foley was placed in IR today.Once the tract matures future 16 or 18 Fr catheter exchange may be easier. She has a neurology follow-up with Dr. Jannifer Franklin later this month.  Review of systems: She states that the change in the catheter today has caused pain. She realizes that she will see her neurologist in follow-up 2/14.  She is attempting to travel by the facility transportation in her wheelchair which locks down in route.  Apparently the ambulance gurney complicates the office visit. She states that after Thanksgiving she was in PT/OT and a "pop" was heard with passive ROM restorative exercise of LE.  She believes that she had a pelvic fracture for which she saw Dr. Percell Miller & Noemi Chapel.  I will verify this with the [facility] NP.   She is on prednisone and has been treated for Hashimoto's thyroiditis.  These may be factors in exacerbation of osteoporosis.  Perhaps a DEXA study could be performed.  Eyes: No redness, discharge, pain, vision change ENT/mouth: No nasal congestion,  purulent discharge, earache, change in hearing, sore throat  Cardiovascular: No chest pain, palpitations, paroxysmal nocturnal  dyspnea, claudication, edema  Respiratory: No cough, sputum production, hemoptysis, significant snoring, apnea   Gastrointestinal: No heartburn, dysphagia, nausea /vomiting, rectal bleeding, melena, change in bowels Genitourinary: No dysuria, hematuria, pyuria, incontinence, nocturia Dermatologic: No rash, pruritus, change in appearance of skin Neurologic: No dizziness, headache, syncope, seizures Psychiatric: No significant anxiety,  insomnia, anorexia Endocrine: No change in hair/skin/nails, excessive thirst, excessive hunger, excessive urination  Hematologic/lymphatic: No significant bruising, lymphadenopathy, abnormal bleeding Allergy/immunology: No itchy/watery eyes, significant sneezing, urticaria, angioedema  Physical exam:  Pertinent or positive findings: She is intelligent & communicative.She is morbidly obese. She is missing multiple mandibular teeth. Abdomen is protuberant. Pedal pulses are decreased. Hands reveal interosseous wasting. There is no range of motion of the lower extremities. The right upper extremity clinically is stronger than the left. Nailbed clubbing is suggested.  General appearance: Adequately nourished; no acute distress, increased work of breathing is present.   Lymphatic: No lymphadenopathy about the head, neck, axilla. Eyes: No conjunctival inflammation or lid edema is present. There is no scleral icterus. Ears:  External ear exam shows no significant lesions or deformities.   Nose:  External nasal examination shows no deformity or inflammation. Nasal mucosa are pink and moist without lesions, exudates Oral exam:  Lips and gums are healthy appearing.  Neck:  No thyromegaly, masses, tenderness noted.    Heart:  Normal rate and regular rhythm. S1 and S2 normal without gallop, murmur, click, rub .  Lungs: Chest clear to auscultation without wheezes, rhonchi, rales, rubs. Abdomen: Bowel sounds are normal. Abdomen is soft with no organomegaly, hernias,  masses. GU: Deferred  Extremities:  No cyanosis,  edema  Neurologic exam :  Balance, Rhomberg, finger to nose testing could not be completed due to clinical state Skin: Warm & dry w/o tenting. No visible significant lesions or rash.  See summary under each active problem in the Problem List with associated updated therapeutic plan

## 2020-08-01 NOTE — H&P (Signed)
Referring Physician(s): Bell,E  Supervising Physician: Irish LackYamagata, Glenn  Patient Status:  [facility]  Chief Complaint: Neurogenic bladder, chronic suprapubic catheter   Subjective: Patient familiar to IR service from suprapubic cath in 2019 followed by routine catheter exchanges/replacement latest being on 06/13/2020. She  has a history of advanced multiple sclerosis with associated paraplegia/neurogenic bladder/chronic incontinence. She presents again today for suprapubic catheter exchange with moderate sedation. She currently denies fever, chest pain, dyspnea, abdo additional medical history as below minal pain, back pain, nausea, vomiting or bleeding. She has had some leakage around SP tube.  Additional medical history as below.  Past Medical History:  Diagnosis Date  . Age related osteoporosis   . Aphasia   . Bronchitis    hx of   . Cellulitis and abscess of toe    hx of   . Constipation   . Cystostomy in place [employer](HCC)   . Degenerative arthritis    osteoarthritis   . Depression   . Diabetes mellitus without complication (HCC)    type 2   . Edema    localized   . Full incontinence of feces   . Gait disturbance   . GERD (gastroesophageal reflux disease)   . Gout    hx of   . History of falling   . Hyperlipidemia   . Hypertension   . Hyperthyroidism   . Multiple sclerosis (HCC)   . Neuromuscular disorder (HCC)    Bilateral hand carpal tunnel syndrome  . Neuromuscular dysfunction of bladder   . Nontoxic multinodular goiter   . Obesity   . Osteoporosis   . Other adrenocortical insufficiency (HCC)   . Paraplegia (HCC)    LEGS  . Personal history of urinary (tract) infections   . Polyneuropathy   . Pressure ulcer of right buttock, stage 4 (HCC)   . Spinal stenosis in cervical region   . Spinal stenosis of lumbosacral region   . Spinal stenosis, thoracic   . Tinea unguium   . Vitamin D deficiency    Past Surgical History:  Procedure Laterality Date  . ABDOMINAL  HYSTERECTOMY    . BACK SURGERY    . BOTOX INJECTION N/A 01/30/2018   Procedure: CYSTOSCOPY BOTOX INJECTION, SUPRAPUBIC EXCHANGE;  Surgeon: Crista ElliotBell, [name], MD;  Location: [facility];  Service: Urology;  Laterality: N/A;  . BOTOX INJECTION N/A 09/02/2018   Procedure: BOTOX INJECTION;  Surgeon: Crista ElliotBell, [name], MD;  Location: [facility];  Service: Urology;  Laterality: N/A;  . BOTOX INJECTION N/A 05/10/2019   Procedure: CYSTOSCOPY BOTOX INJECTION, SUPRAPUBIC EXCHANGE;  Surgeon: Crista ElliotBell, [name], MD;  Location: [facility];  Service: Urology;  Laterality: N/A;  . BOTOX INJECTION N/A 09/06/2019   Procedure: CYSTOSCOPY BOTOX INJECTION;  Surgeon: Crista ElliotBell, [name], MD;  Location: [facility];  Service: Urology;  Laterality: N/A;  . BOTOX INJECTION N/A 04/10/2020   Procedure: CYSTOSCOPY BOTOX INJECTION;  Surgeon: Jerilee FieldEskridge, Matthew, MD;  Location: [facility];  Service: Urology;  Laterality: N/A;  . CATARACT EXTRACTION Right   . CHOLECYSTECTOMY    . CYSTOSCOPY N/A 09/02/2018   Procedure: CYSTOSCOPY;  Surgeon: Crista ElliotBell, [name], MD;  Location: [facility];  Service: Urology;  Laterality: N/A;  . CYSTOSTOMY N/A 09/02/2018   Procedure: CYSTOSTOMY SUPRAPUBIC;  Surgeon: Crista ElliotBell, [name], MD;  Location: [facility];  Service: Urology;  Laterality: N/A;  45 MINS  . FEMUR IM NAIL Right 06/02/2014   Procedure: INTRAMEDULLARY (IM) RETROGRADE FEMORAL NAILING;  Surgeon:  Johnny Bridge, MD;  Location: [place];  Service: Orthopedics;  Laterality: Right;  . GROIN DISSECTION Left 10/24/2017   Procedure: IRRIGATION AND DEBRIDEMENT, LEFT THIGH ABCESS ;  Surgeon: Alphonsa Overall, MD;  Location: [facility];  Service: General;  Laterality: Left;  . INSERTION OF SUPRAPUBIC CATHETER N/A 09/06/2019   Procedure: SUPRAPUBIC TUBE CHANGE;  Surgeon: Lucas Mallow, MD;  Location: [facility];  Service: Urology;  Laterality: N/A;  . INSERTION OF SUPRAPUBIC CATHETER N/A 04/10/2020   Procedure: SUPRAPUBIC CATHETER EXCHANGE/TRACT  DILATION;  Surgeon: Festus Aloe, MD;   Location: [facility];  Service: Urology;  Laterality: N/A;  . IR CATHETER TUBE CHANGE  10/13/2017  . IR CATHETER TUBE CHANGE  07/15/2018  . IR CATHETER TUBE CHANGE  08/26/2019  . IR CATHETER TUBE CHANGE  11/25/2019  . IR CATHETER TUBE CHANGE  01/06/2020  . IR CATHETER TUBE CHANGE  06/13/2020  . left hand carpal tunnel surgery    . REPLACEMENT TOTAL KNEE BILATERAL  2004      Allergies: Codeine, Ultram [tramadol], and Januvia [sitagliptin]  Medications: Prior to Admission medications   Medication Sig Start Date End Date Taking? Authorizing Provider  acetaminophen (TYLENOL) 325 MG tablet Take 650 mg by mouth every 6 (six) hours as needed for mild pain or fever.    Yes [provider]  baclofen (LIORESAL) 10 MG tablet TAKE  1.5 tabs (15 mg)  BY MOUTH ONCE DAILY IN THE MORNING & TAKE 1 TABLET (10 MG) BY MOUTH AT NOON   Yes [provider]  baclofen (LIORESAL) 20 MG tablet Take 20 mg by mouth at bedtime.    Yes [provider]  barrier cream (NON-SPECIFIED) CREA Apply 1 application topically 2 (two) times daily. Apply to right ischium and bilateral buttocks   Yes [provider]  bisacodyl (DULCOLAX) 10 MG suppository Place 10 mg rectally as needed for moderate constipation.   Yes [provider]  calcium citrate (CALCITRATE - DOSED IN MG ELEMENTAL CALCIUM) 950 MG tablet Take 200 mg of elemental calcium by mouth daily. (0800)   Yes [provider]  Carboxymeth-Glycerin-Polysorb 0.5-1-0.5 % SOLN Apply 1 drop to eye 4 (four) times daily.    Yes [provider]  cholecalciferol (VITAMIN D) 1000 UNITS tablet Take 1,000 Units by mouth daily. (0800)   Yes [provider]  cycloSPORINE (RESTASIS) 0.05 % ophthalmic emulsion Place 1 drop into both eyes 2 (two) times daily.    Yes [provider]  DULoxetine (CYMBALTA) 30 MG capsule Take 30 mg by mouth daily. (0800) Give along with 60 mg to = 90 mg   Yes [provider]   DULoxetine (CYMBALTA) 60 MG capsule Take 60 mg by mouth daily. (0800)Give along with 30 mg to = 90 mg   Yes [provider]  furosemide (LASIX) 40 MG tablet Take 40 mg by mouth daily. (0800)   Yes [provider]  gabapentin (NEURONTIN) 400 MG capsule Take 400 mg by mouth 3 (three) times daily. (0800, 1400, & 2000)   Yes [provider]  guaiFENesin (MUCINEX) 600 MG 12 hr tablet Take 1,200 mg by mouth at bedtime. (2000)   Yes [provider]  insulin glargine (LANTUS) 100 UNIT/ML Solostar Pen Inject 18 Units into the skin at bedtime.    Yes [provider]  loperamide (IMODIUM) 2 MG capsule Take by mouth daily as needed for diarrhea or loose stools. SUBSEQUENT DOSE GIVE ONE TAB AFTER EACH STOOL  Yes [provider]  magnesium hydroxide (MILK OF MAGNESIA) 400 MG/5ML suspension Take 30 mLs by mouth daily as needed for mild constipation.   Yes [provider]  magnesium oxide (MAG-OX) 400 MG tablet Take 400 mg by mouth every morning. (1000)   Yes [provider]  methimazole (TAPAZOLE) 5 MG tablet Take 1 tablet (5 mg total) by mouth daily. 11/11/17  Yes Renato Shin, MD  Multiple Vitamin (MULTIVITAMIN WITH MINERALS) TABS tablet Take 1 tablet by mouth daily. (0800)   Yes [provider]  ondansetron (ZOFRAN) 4 MG tablet Take 4 mg by mouth every 6 (six) hours as needed for nausea or vomiting.   Yes [provider]  pantoprazole (PROTONIX) 40 MG tablet Take 40 mg by mouth daily at 6 (six) AM.    Yes [provider]  potassium chloride (KLOR-CON) 10 MEQ tablet Take 10 mEq by mouth every morning. 03/23/20  Yes [provider]  predniSONE (DELTASONE) 2.5 MG tablet Take 2.5 mg by mouth every other day. (0800) Alternating days with 5 mg dose   Yes [provider]  predniSONE (DELTASONE) 5 MG tablet Take 5 mg by mouth every other day. (0800) Alternating days with 2.5 mg dose   Yes [provider]  Sodium Phosphates (RA SALINE ENEMA RE) Place 1 enema rectally daily as needed (constipation).   Yes [provider]  solifenacin (VESICARE) 10 MG tablet Take 10 mg by mouth daily. (0800)   Yes [provider]  spironolactone (ALDACTONE) 100 MG tablet Take 100 mg by mouth daily. (0800)   Yes [provider]  TECFIDERA 240 MG CPDR Take 1 capsule (240 mg total) by mouth 2 (two) times daily. 12/07/19  Yes Suzzanne Cloud, NP  Dulaglutide (TRULICITY) 3 0000000 SOPN Inject 3 mg into the skin every Friday.     [provider]  metoprolol succinate (TOPROL-XL) 25 MG 24 hr tablet Take 25 mg by mouth daily.    [provider]     Vital Signs: BP 116/73   Pulse (!) 101   Temp 98.2 F (36.8 C) (Oral)   Resp 18   Ht 5\' 2"  (1.575 m)   Wt 178 lb 6.4 oz (80.9 kg)   SpO2 96%   BMI 32.63 kg/m   Physical Exam awake, alert.  Chest with slight diminished breath sounds bases.  Heart with slightly tachycardic but regular rhythm.  Abdomen soft, positive bowel sounds not significantly tender, intact suprapubic catheter; no movement of lower extremities  Imaging: No results found.  Labs:  CBC: Recent Labs    10/28/19 0945 12/07/19 1103 12/14/19 0000 04/10/20 1030  WBC 12.3* 14.1* 11.5 13.8*  HGB 13.7 12.9 12.3 12.7  HCT 42.3 39.7 38 39.6  PLT 337 369 334 327    COAGS: Recent Labs    10/28/19 0945  INR 1.1    BMP: Recent Labs    09/06/19 0940 10/28/19 0945 11/02/19 0000 12/07/19 1103 12/14/19 0000 01/27/20 0000 03/16/20 0000 04/10/20 1030  NA 142 143   < > 144 144 141  141 144  144 143  K 3.0* 2.3*   < > 3.3* 3.6 3.7  3.7 4.3  4.3 4.4  CL 93* 86*   < > 94* 96* 98*  98* 101  101 99  CO2 39* 42*   < > 34* 32* 33*  33* 29*  29* 31  GLUCOSE 104* 78  --  124*  --   --   --  108*  BUN 9 9   < > 10 10 12  12 14  14 17   CALCIUM 9.0 9.3   < > 10.3 10.1 9.8  9.8 9.9  9.9 9.3  CREATININE 0.54 0.51   < > 0.72 0.4* 0.5   0.5 0.5  0.5 0.60  GFRNONAA >60 >60   < > 81 90 >90  90 >90  90 >60  GFRAA >60 >60   < > 93 90 >90  90 >90  90  --    < > = values in this interval not displayed.    LIVER FUNCTION TESTS: Recent Labs    12/07/19 1103  BILITOT 0.3  AST 11  ALT 15  ALKPHOS 74  PROT 6.3  ALBUMIN 3.5*    Assessment and Plan: Patient familiar to IR service from suprapubic cath in 2019 followed by routine catheter exchanges/replacement latest being on 06/13/2020. She  has a history of advanced multiple sclerosis with associated paraplegia/neurogenic bladder/chronic incontinence. She presents again today for suprapubic catheter exchange with moderate sedation. She currently denies fever, chest pain, dyspnea, abdo additional medical history as below minal pain, back pain, nausea, vomiting or bleeding. She has had some leakage around SP tube.  Details/risks of procedure, including but not limited to, internal bleeding, infection, injury to adjacent structures discussed with patient with her understanding and consent.   Electronically Signed: D. Rowe Robert, PA-C 08/01/2020, 12:40 PM   I spent a total of 20 minutes at the the patient's bedside AND on the patient's hospital floor or unit, greater than 50% of which was counseling/coordinating care for suprapubic catheter exchange

## 2020-08-01 NOTE — Discharge Instructions (Signed)
Interventional radiology phone numbers [phone] After hours [phone]     Suprapubic Catheter Replacement  Suprapubic catheter replacement is a procedure to remove an old catheter and insert a new, clean catheter. A suprapubic catheter is a rubber tube that drains urine from the bladder into a collection bag outside the body. The catheter is inserted into the bladder through a small opening in the lower abdomen, near the center of the body, above the pubic bone (suprapubicarea). There is a tiny balloon filled with germ-free (sterile) water on the end of the catheter that is in the bladder. The balloon helps to keep the catheter in place. If you need to wear a catheter for a long period of time, you may be instructed to replace the catheter yourself.  What are the risks? Generally, this is a safe procedure. However, problems may occur, including failure to get the catheter into the bladder. What happens during the procedure?  You will lie on your back.  The water from the balloon will be removed using a syringe.  The catheter will be slowly removed.  Lubricant will be applied to the end of the new catheter that will go into your bladder.  The new catheter will be inserted through the opening in your abdomen. Your health care provider will slide the catheter into your bladder.  Your health care provider will wait for some urine to start flowing through the catheter. When this happens, a syringe will be used to fill the balloon with sterile water.  A collection bag will be attached to the end of the catheter. The procedure may vary among health care providers and hospitals. What can I expect after procedure? After the procedure, it is common to have:  Some discomfort around the opening in your abdomen. Follow these instructions at home: Caring for the skin around the catheter Use a clean washcloth and soapy water to clean the skin around your catheter every day. Pat the area  dry with a clean towel.  Do not pull on the catheter.  Do not use ointment or lotion on this area unless told by your health care provider.  Check the skin around the catheter every day for signs of infection. Check for: ? Redness, swelling, or pain. ? Fluid or blood. ? Warmth. ? Pus or a bad smell. Caring for the catheter  Clean the catheter with soap and water as often as told by your health care provider.  Always make sure there are no twists or curls (kinks) in the catheter.  As soon as you are able to move, you may use a leg bag to collect the urine. ? Make sure that the tubing is straight and without kinks. ? Wrap an ace bandage gently over the tubing and around your leg to minimize the risk of the bag getting pulled out. Emptying the collection bag Empty the large collection bag every 8 hours. Empty the small collection bag when it is about ? full. To empty your large or small collection bag, take the following steps:  Always keep the bag below the level of the catheter. This keeps urine from flowing backward into the catheter.  Hold the bag over the toilet or another container. Turn the valve (spigot) at the bottom of the bag to empty the urine. ? Do not touch the opening of the spigot. ? Do not let the opening touch the toilet or container.  Close the spigot tightly when the bag is empty. Cleaning the collection bag  Wash your hands with soap and water. If soap and water are not available, use hand sanitizer.  Disconnect the bag from the catheter and immediately attach a new bag to the catheter.  Empty the used bag completely.  Clean the used bag according to the manufacturer's instructions, or as told by your health care provider.  Let the bag dry completely, and put it in a clean plastic bag before storing it.   General instructions  Always wash your hands before and after caring for your catheter and collection bag. Use soap and water. If soap and water are not  available, use hand sanitizer.  Always make sure there are no leaks in the catheter or collection bag.  Drink enough fluid to keep your urine pale yellow.  If you were prescribed an antibiotic medicine, take it as told by your health care provider. Do not stop taking the antibiotic even if you start to feel better.  Do not take baths, swim, or use a hot tub.  Keep all follow-up visits as told by your health care provider. This is important.   Contact a health care provider if:  You leak urine.  You have redness, swelling, or pain around your catheter opening.  You have fluid or blood coming from your catheter opening.  Your catheter opening feels warm to the touch.  You have pus or a bad smell coming from your catheter opening.  You have a fever or chills.  Your urine flow slows down.  Your urine becomes cloudy or smelly. Get help right away if:  Your catheter comes out.  You feel nauseous.  You have back pain.  You have difficulty changing your catheter.  You have blood in your urine.  You have no urine flow for 1 hour. Summary  Suprapubic catheter replacement is a procedure to remove an old catheter and insert a new, clean catheter.  Make sure that you understand how to care for your catheter, your collection bag, and the opening in your abdomen.  Always wash your hands before and after caring for your catheter and collection bag.  Contact a health care provider if you leak urine or have a fever or any signs of infection around the catheter opening, or if your urine becomes cloudy or smelly.  Get help right away if your catheter comes out or you have nausea, back pain, blood in your urine, no urine flow for 1 hour, or difficulty changing your catheter. This information is not intended to replace advice given to you by your health care provider. Make sure you discuss any questions you have with your health care provider. Document Revised: 02/04/2018 Document  Reviewed: 02/04/2018 Elsevier Patient Education  2021 [employer].     Moderate Conscious Sedation, Adult, Care After This sheet gives you information about how to care for yourself after your procedure. Your health care provider may also give you more specific instructions. If you have problems or questions, contact your health care provider. What can I expect after the procedure? After the procedure, it is common to have:  Sleepiness for several hours.  Impaired judgment for several hours.  Difficulty with balance.  Vomiting if you eat too soon. Follow these instructions at home: For the time period you were told by your health care provider:  Rest.  Do not participate in activities where you could fall or become injured.  Do not drive or use machinery.  Do not drink alcohol.  Do not take sleeping pills or  medicines that cause drowsiness.  Do not make important decisions or sign legal documents.  Do not take care of children on your own.      Eating and drinking  Follow the diet recommended by your health care provider.  Drink enough fluid to keep your urine pale yellow.  If you vomit: ? Drink water, juice, or soup when you can drink without vomiting. ? Make sure you have little or no nausea before eating solid foods.   General instructions  Take over-the-counter and prescription medicines only as told by your health care provider.  Have a responsible adult stay with you for the time you are told. It is important to have someone help care for you until you are awake and alert.  Do not smoke.  Keep all follow-up visits as told by your health care provider. This is important. Contact a health care provider if:  You are still sleepy or having trouble with balance after 24 hours.  You feel light-headed.  You keep feeling nauseous or you keep vomiting.  You develop a rash.  You have a fever.  You have redness or swelling around the IV site. Get help  right away if:  You have trouble breathing.  You have new-onset confusion at home. Summary  After the procedure, it is common to feel sleepy, have impaired judgment, or feel nauseous if you eat too soon.  Rest after you get home. Know the things you should not do after the procedure.  Follow the diet recommended by your health care provider and drink enough fluid to keep your urine pale yellow.  Get help right away if you have trouble breathing or new-onset confusion at home. This information is not intended to replace advice given to you by your health care provider. Make sure you discuss any questions you have with your health care provider. Document Revised: 10/15/2019 Document Reviewed: 05/13/2019 Elsevier Patient Education  2021 [employer].

## 2020-08-02 ENCOUNTER — Other Ambulatory Visit: Payer: Self-pay | Admitting: Urology

## 2020-08-02 NOTE — Assessment & Plan Note (Signed)
18 Fr Council tip Foley cath placed in IR today.Once tract matures easier catheter exchange with 16 or 18 Fr cath anticipated.

## 2020-08-07 ENCOUNTER — Other Ambulatory Visit: Payer: Self-pay

## 2020-08-07 ENCOUNTER — Non-Acute Institutional Stay: Payer: Medicare Other | Admitting: Hospice

## 2020-08-07 DIAGNOSIS — N319 Neuromuscular dysfunction of bladder, unspecified: Secondary | ICD-10-CM

## 2020-08-07 DIAGNOSIS — Z515 Encounter for palliative care: Secondary | ICD-10-CM

## 2020-08-07 NOTE — Progress Notes (Signed)
[facility] Consult Note Telephone: [phone]  Fax: [phone]  PATIENT NAME: Laura Roberts DOB: Jul 23, 1940 MRN: [id number]  PRIMARY CARE PROVIDER:   [name], MD Laura Roberts, [place],  [place] [address]  REFERRING PROVIDER: [name], MD Laura Roberts, [place],  [place] [address]  RESPONSIBLE PARTY:     Visit is to build trust and highlight Palliative Medicine as specialized medical care for people living with serious illness, aimed at facilitating better quality of life through symptoms relief, assisting with advance care plan and establishing goals of care.   CHIEF COMPLAINT: Follow up palliative visit/Neurogenic bladder  RECOMMENDATIONS/PLAN:   1. Advance Care Planning/Code Status: Reviewed advance directives today.  Patient affirmed she is a DO NOT RESUSCITATE.  Signed DNR form in facility chart reuploaded to epic today.  2. Goals of Care: Goals of care include to maximize quality of life and symptom management.  Signed MOST form in facility records reuploaded to epic today.  Most selections include Do not attempt resuscitation, limited additional intervention, no feeding tube, antibiotics as indicated, IV fluids as indicated.Signed  MOST form reuploaded to Epic today. Visit consisted of counseling and education dealing with the complex and emotionally intense issues of symptom management and palliative care in the setting of serious and potentially life-threatening illness. Palliative care team will continue to support patient, patient's family, and medical team.  I spent 46 minutes providing this consultation. More than 50% of the time in this consultation was spent on coordinating communication.  -------------------------------------------------------------------------------------------------------------------------------------------------- 3. Symptom  management/Plan:  Neurogenic bladder: Recent suprapubic cath replacement well-tolerated with no adverse reactions.  Continue to follow up with [facility] as planned.  Botox injection planned for next month March 2022.  Palliative will continue to monitor for symptom management/decline and make recommendations as needed. Return 2 months or prn. Encouraged to call provider sooner with any concerns.  HISTORY OF PRESENT ILLNESS:  Laura Roberts is a 80 y.o. year old female with multiple medical problems including neurogenic bladder related to multiple sclerosis. Neurogenic bladder is chronic, [facility]; recent suprapubic catheter replacement to a larger size 18 French well-tolerated.  No spasms, no urinary symptoms. Hx of MS, paraplegia, depression, DM2, HTN, neurogenic pain of LE. Palliative Care was asked to help address goals of care and complex decision making. Review and summarization of old Epic records shows history from other than patient. Rest of 10 point ROS asked and negative.  CODE STATUS: DNR  PPS: 30%  HOSPICE ELIGIBILITY/DIAGNOSIS: TBD  PAST MEDICAL HISTORY:  Past Medical History:  Diagnosis Date  . Age related osteoporosis   . Aphasia   . Bronchitis    hx of   . Cellulitis and abscess of toe    hx of   . Constipation   . Cystostomy in place [facility])   . Degenerative arthritis    osteoarthritis   . Depression   . Diabetes mellitus without complication ([place])    type 2   . Edema    localized   . Full incontinence of feces   . Gait disturbance   . GERD (gastroesophageal reflux disease)   . Gout    hx of   . History of falling   . Hyperlipidemia   . Hypertension   . Hyperthyroidism   . Multiple sclerosis ([place])   . Neuromuscular disorder (HCC)    Bilateral hand carpal tunnel  syndrome  . Neuromuscular dysfunction of bladder   . Nontoxic multinodular goiter   . Obesity   . Osteoporosis   . Other adrenocortical insufficiency ([place])   .  Paraplegia (HCC)    LEGS  . Personal history of urinary (tract) infections   . Polyneuropathy   . Pressure ulcer of right buttock, stage 4 ([place])   . Spinal stenosis in cervical region   . Spinal stenosis of lumbosacral region   . Spinal stenosis, thoracic   . Tinea unguium   . Vitamin D deficiency     SOCIAL HX: Patient in SNF for ongoing care  FAMILY HX:  Family History  Problem Relation Age of Onset  . Multiple sclerosis Other        neices.   . Cancer Mother   . Diabetes Mother   . GI Bleed Sister        diverticulitis  . Thyroid disease Neg Hx     Labs:   Recent Labs  Lab 08/01/20 1230  WBC 11.4*  HGB 13.9  HCT 42.8  PLT 322  MCV 89.9   Recent Labs  Lab 08/01/20 1230  NA 139  K 4.4  CL 97*  CO2 28  BUN 35*  CREATININE 0.92  GLUCOSE 88   Latest GFR by Cockcroft Gault (not valid in AKI or ESRD) Estimated Creatinine Clearance: 50.4 mL/min (by C-G formula based on SCr of 0.92 mg/dL). No results for input(s): AST, ALT, ALKPHOS, GGT in the last 168 hours.  Invalid input(s): TBILI, CONJBILI, ALB, TOTALPROTEIN No components found for: ALB Recent Labs  Lab 08/01/20 1230  INR 1.0   No results for input(s): BNP, PROBNP in the last 168 hours.  ALLERGIES:  Allergies  Allergen Reactions  . Codeine Other (See Comments)    Difficult breathing and skin problem  . Ultram [Tramadol] Other (See Comments)    Difficult breathing and skin peeling  . Januvia [Sitagliptin] Rash and Other (See Comments)    Blisters      PERTINENT MEDICATIONS:  Outpatient Encounter Medications as of 08/07/2020  Medication Sig  . acetaminophen (TYLENOL) 325 MG tablet Take 650 mg by mouth every 6 (six) hours as needed for mild pain or fever.   . baclofen (LIORESAL) 10 MG tablet TAKE  1.5 tabs (15 mg)  BY MOUTH ONCE DAILY IN THE MORNING & TAKE 1 TABLET (10 MG) BY MOUTH AT NOON  . baclofen (LIORESAL) 20 MG tablet Take 20 mg by mouth at bedtime.   . barrier cream (NON-SPECIFIED) CREA  Apply 1 application topically 2 (two) times daily. Apply to right ischium and bilateral buttocks  . bisacodyl (DULCOLAX) 10 MG suppository Place 10 mg rectally as needed for moderate constipation.  . calcium citrate (CALCITRATE - DOSED IN MG ELEMENTAL CALCIUM) 950 MG tablet Take 200 mg of elemental calcium by mouth daily. (0800)  . Carboxymeth-Glycerin-Polysorb 0.5-1-0.5 % SOLN Apply 1 drop to eye 4 (four) times daily.   . cholecalciferol (VITAMIN D) 1000 UNITS tablet Take 1,000 Units by mouth daily. (0800)  . cycloSPORINE (RESTASIS) 0.05 % ophthalmic emulsion Place 1 drop into both eyes 2 (two) times daily.   . Dulaglutide (TRULICITY) 3 JE/5.6DJ SOPN Inject 3 mg into the skin every Friday.   . DULoxetine (CYMBALTA) 30 MG capsule Take 30 mg by mouth daily. (0800) Give along with 60 mg to = 90 mg  . DULoxetine (CYMBALTA) 60 MG capsule Take 60 mg by mouth daily. (0800)Give along with 30 mg to = 90  mg  . furosemide (LASIX) 40 MG tablet Take 40 mg by mouth daily. (0800)  . gabapentin (NEURONTIN) 400 MG capsule Take 400 mg by mouth 3 (three) times daily. (0800, 1400, & 2000)  . guaiFENesin (MUCINEX) 600 MG 12 hr tablet Take 1,200 mg by mouth at bedtime. (2000)  . insulin glargine (LANTUS) 100 UNIT/ML Solostar Pen Inject 18 Units into the skin at bedtime.   Marland Kitchen loperamide (IMODIUM) 2 MG capsule Take by mouth daily as needed for diarrhea or loose stools. SUBSEQUENT DOSE GIVE ONE TAB AFTER EACH STOOL  . magnesium hydroxide (MILK OF MAGNESIA) 400 MG/5ML suspension Take 30 mLs by mouth daily as needed for mild constipation.  . magnesium oxide (MAG-OX) 400 MG tablet Take 400 mg by mouth every morning. (1000)  . methimazole (TAPAZOLE) 5 MG tablet Take 1 tablet (5 mg total) by mouth daily.  . metoprolol succinate (TOPROL-XL) 25 MG 24 hr tablet Take 25 mg by mouth daily.  . Multiple Vitamin (MULTIVITAMIN WITH MINERALS) TABS tablet Take 1 tablet by mouth daily. (0800)  . ondansetron (ZOFRAN) 4 MG tablet Take 4 mg  by mouth every 6 (six) hours as needed for nausea or vomiting.  . pantoprazole (PROTONIX) 40 MG tablet Take 40 mg by mouth daily at 6 (six) AM.   . potassium chloride (KLOR-CON) 10 MEQ tablet Take 10 mEq by mouth every morning.  . predniSONE (DELTASONE) 2.5 MG tablet Take 2.5 mg by mouth every other day. (0800) Alternating days with 5 mg dose  . predniSONE (DELTASONE) 5 MG tablet Take 5 mg by mouth every other day. (0800) Alternating days with 2.5 mg dose  . Sodium Phosphates (RA SALINE ENEMA RE) Place 1 enema rectally daily as needed (constipation).  . solifenacin (VESICARE) 10 MG tablet Take 10 mg by mouth daily. (0800)  . spironolactone (ALDACTONE) 100 MG tablet Take 100 mg by mouth daily. (0800)  . TECFIDERA 240 MG CPDR Take 1 capsule (240 mg total) by mouth 2 (two) times daily.   No facility-administered encounter medications on file as of 08/07/2020.    ROS  General: NAD EYES: denies vision changes ENMT: denies dysphagia no xerostomia Cardiovascular: denies chest pain Pulmonary: denies  cough, denies SOB  Abdomen: endorses fair appetite, no constipation or diarrhea [id number] bladder spasm; no urinary symtoms MSK:  endorses ROM limitations, no falls reported Skin: denies rashes or wounds Neurological: endorses weakness, denies pain, denies insomnia Psych: Endorses positive mood Heme/lymph/immuno: denies bruises, abnormal bleeding   PHYSICAL EXAM  General: In no acute distress, cooperative Cardiovascular: regular rate and rhythm Pulmonary: no cough, no increased work of breathing, normal respiratory effort Abdomen: soft, non tender, positive bowel sounds in all quadrants GU:  no suprapubic tenderness; suprapubic Size 18 French in place, patient with likely low urine Eyes: Normal lids, no discharge, sclera anicteric ENMT: Moist mucous membranes Musculoskeletal: Trace edema in BLE Skin: no rash to visible skin, warm without cyanosis Psych: non-anxious affect Neurological:  Weakness but otherwise non focal Heme/lymph/immuno: no bruises, no bleeding  Palliative Care was asked to follow this patient by consultation request of Laura Limes, MD to help address advance care planning and complex decision making. Thank you for the opportunity to participate in the care of Laura Roberts Please call our office at [phone] if we can be of additional assistance.  Note: Portions of this note were generated with [employer]ist. Dictation errors may occur despite best attempts at proofreading.  Teodoro Spray, NP

## 2020-08-14 ENCOUNTER — Ambulatory Visit (INDEPENDENT_AMBULATORY_CARE_PROVIDER_SITE_OTHER): Payer: Medicare Other | Admitting: Neurology

## 2020-08-14 ENCOUNTER — Telehealth: Payer: Self-pay | Admitting: Neurology

## 2020-08-14 ENCOUNTER — Other Ambulatory Visit: Payer: Self-pay

## 2020-08-14 ENCOUNTER — Encounter: Payer: Self-pay | Admitting: Neurology

## 2020-08-14 VITALS — BP 122/74 | HR 90 | Ht 62.0 in | Wt 178.4 lb

## 2020-08-14 DIAGNOSIS — G8220 Paraplegia, unspecified: Secondary | ICD-10-CM

## 2020-08-14 DIAGNOSIS — G35 Multiple sclerosis: Secondary | ICD-10-CM

## 2020-08-14 DIAGNOSIS — N319 Neuromuscular dysfunction of bladder, unspecified: Secondary | ICD-10-CM | POA: Diagnosis not present

## 2020-08-14 NOTE — Telephone Encounter (Signed)
Pt's nurse called today regarding pt's consultation report, she is having trouble understanding it and needs a call back. Best call back is [phone]

## 2020-08-14 NOTE — Progress Notes (Signed)
Reason for visit: Multiple sclerosis  Laura Roberts is an 80 y.o. female  History of present illness:  Laura Roberts is a 80 year old right-handed black female with a history of multiple sclerosis that presented with a paraplegia.  The patient has been stable for a number of years, she indicates that since she has been on Tecfidera she is no longer having any exacerbations of her MS.  She does have neurogenic bladder, she has a suprapubic catheter in place.  Occasionally, she may have some spasms of the lower extremities.  She has no skin breakdown issues.  During the day, she usually mobilizes in an IT trainer wheelchair.  She denies any changes in vision or speech or swallowing.  She has severe discomfort of the left shoulder secondary to arthritis, she has gotten injections in the past.  She recently sustained a spontaneous pelvic fracture that occurred while doing some therapy.  She has not had any falls.  She is tolerating the Tecfidera fairly well.  She has had recent blood work with a CBC and differential showing absolute lymphocyte count of 2.0.  Past Medical History:  Diagnosis Date  . Age related osteoporosis   . Aphasia   . Bronchitis    hx of   . Cellulitis and abscess of toe    hx of   . Constipation   . Cystostomy in place [facility])   . Degenerative arthritis    osteoarthritis   . Depression   . Diabetes mellitus without complication ([place])    type 2   . Edema    localized   . Full incontinence of feces   . Gait disturbance   . GERD (gastroesophageal reflux disease)   . Gout    hx of   . History of falling   . Hyperlipidemia   . Hypertension   . Hyperthyroidism   . Multiple sclerosis ([place])   . Neuromuscular disorder (HCC)    Bilateral hand carpal tunnel syndrome  . Neuromuscular dysfunction of bladder   . Nontoxic multinodular goiter   . Obesity   . Osteoporosis   . Other adrenocortical insufficiency ([place])   . Paraplegia (HCC)    LEGS  . Personal history of  urinary (tract) infections   . Polyneuropathy   . Pressure ulcer of right buttock, stage 4 ([place])   . Spinal stenosis in cervical region   . Spinal stenosis of lumbosacral region   . Spinal stenosis, thoracic   . Tinea unguium   . Vitamin D deficiency     Past Surgical History:  Procedure Laterality Date  . ABDOMINAL HYSTERECTOMY    . BACK SURGERY    . BOTOX INJECTION N/A 01/30/2018   Procedure: CYSTOSCOPY BOTOX INJECTION, SUPRAPUBIC EXCHANGE;  Surgeon: Lucas Mallow, MD;  Location: [facility];  Service: Urology;  Laterality: N/A;  . BOTOX INJECTION N/A 09/02/2018   Procedure: BOTOX INJECTION;  Surgeon: Lucas Mallow, MD;  Location: [facility];  Service: Urology;  Laterality: N/A;  . BOTOX INJECTION N/A 05/10/2019   Procedure: CYSTOSCOPY BOTOX INJECTION, SUPRAPUBIC EXCHANGE;  Surgeon: Lucas Mallow, MD;  Location: [facility];  Service: Urology;  Laterality: N/A;  . BOTOX INJECTION N/A 09/06/2019   Procedure: CYSTOSCOPY BOTOX INJECTION;  Surgeon: Lucas Mallow, MD;  Location: [facility];  Service: Urology;  Laterality: N/A;  . BOTOX INJECTION N/A 04/10/2020   Procedure: CYSTOSCOPY BOTOX INJECTION;  Surgeon: Festus Aloe, MD;  Location: [facility];  Service: Urology;  Laterality:  N/A;  . CATARACT EXTRACTION Right   . CHOLECYSTECTOMY    . CYSTOSCOPY N/A 09/02/2018   Procedure: CYSTOSCOPY;  Surgeon: Lucas Mallow, MD;  Location[facility];  Service: Urology;  Laterality: N/A;  . CYSTOSTOMY N/A 09/02/2018   Procedure: CYSTOSTOMY SUPRAPUBIC;  Surgeon: Lucas Mallow, MD;  Location: [facility];  Service: Urology;  Laterality: N/A;  42 MINS  . FEMUR IM NAIL Right 06/02/2014   Procedure: INTRAMEDULLARY (IM) RETROGRADE FEMORAL NAILING;  Surgeon: Johnny Bridge, MD;  Location: [place];  Service: Orthopedics;  Laterality: Right;  . GROIN DISSECTION Left 10/24/2017   Procedure: IRRIGATION AND DEBRIDEMENT, LEFT THIGH ABCESS ;  Surgeon: Alphonsa Overall, MD;  Location: [facility];  Service: General;  Laterality:  Left;  . INSERTION OF SUPRAPUBIC CATHETER N/A 09/06/2019   Procedure: SUPRAPUBIC TUBE CHANGE;  Surgeon: Lucas Mallow, MD;  Location[facility];  Service: Urology;  Laterality: N/A;  . INSERTION OF SUPRAPUBIC CATHETER N/A 04/10/2020   Procedure: SUPRAPUBIC CATHETER EXCHANGE/TRACT  DILATION;  Surgeon: Festus Aloe, MD;  Location[facility];  Service: Urology;  Laterality: N/A;  . IR CATHETER TUBE CHANGE  10/13/2017  . IR CATHETER TUBE CHANGE  07/15/2018  . IR CATHETER TUBE CHANGE  08/26/2019  . IR CATHETER TUBE CHANGE  11/25/2019  . IR CATHETER TUBE CHANGE  01/06/2020  . IR CATHETER TUBE CHANGE  06/13/2020  . IR CATHETER TUBE CHANGE  08/01/2020  . left hand carpal tunnel surgery    . REPLACEMENT TOTAL KNEE BILATERAL  2004    Family History  Problem Relation Age of Onset  . Multiple sclerosis Other        neices.   . Cancer Mother   . Diabetes Mother   . GI Bleed Sister        diverticulitis  . Thyroid disease Neg Hx     Social history:  reports that she has never smoked. She has never used smokeless tobacco. She reports that she does not drink alcohol and does not use drugs.    Allergies  Allergen Reactions  . Codeine Other (See Comments)    Difficult breathing and skin problem  . Ultram [Tramadol] Other (See Comments)    Difficult breathing and skin peeling  . Januvia [Sitagliptin] Rash and Other (See Comments)    Blisters    Medications:  Prior to Admission medications   Medication Sig Start Date End Date Taking? Authorizing Provider  acetaminophen (TYLENOL) 325 MG tablet Take 650 mg by mouth every 6 (six) hours as needed for mild pain or fever.     [provider]  baclofen (LIORESAL) 10 MG tablet TAKE  1.5 tabs (15 mg)  BY MOUTH ONCE DAILY IN THE MORNING & TAKE 1 TABLET (10 MG) BY MOUTH AT NOON    [provider]  baclofen (LIORESAL) 20 MG tablet Take 20 mg by mouth at bedtime.     [provider]  barrier cream (NON-SPECIFIED) CREA Apply 1  application topically 2 (two) times daily. Apply to right ischium and bilateral buttocks    [provider]  bisacodyl (DULCOLAX) 10 MG suppository Place 10 mg rectally as needed for moderate constipation.    [provider]  calcium citrate (CALCITRATE - DOSED IN MG ELEMENTAL CALCIUM) 950 MG tablet Take 200 mg of elemental calcium by mouth daily. (0800)    [provider]  Carboxymeth-Glycerin-Polysorb 0.5-1-0.5 % SOLN Apply 1 drop to eye 4 (four) times daily.  [provider]  cholecalciferol (VITAMIN D) 1000 UNITS tablet Take 1,000 Units by mouth daily. (0800)    [provider]  cycloSPORINE (RESTASIS) 0.05 % ophthalmic emulsion Place 1 drop into both eyes 2 (two) times daily.     [provider]  Dulaglutide (TRULICITY) 3 RU/0.4VW SOPN Inject 3 mg into the skin every Friday.     [provider]  DULoxetine (CYMBALTA) 30 MG capsule Take 30 mg by mouth daily. (0800) Give along with 60 mg to = 90 mg    [provider]  DULoxetine (CYMBALTA) 60 MG capsule Take 60 mg by mouth daily. (0800)Give along with 30 mg to = 90 mg    [provider]  furosemide (LASIX) 40 MG tablet Take 40 mg by mouth daily. (0800)    [provider]  gabapentin (NEURONTIN) 400 MG capsule Take 400 mg by mouth 3 (three) times daily. (0800, 1400, & 2000)    [provider]  guaiFENesin (MUCINEX) 600 MG 12 hr tablet Take 1,200 mg by mouth at bedtime. (2000)    [provider]  insulin glargine (LANTUS) 100 UNIT/ML Solostar Pen Inject 18 Units into the skin at bedtime.     [provider]  loperamide (IMODIUM) 2 MG capsule Take by mouth daily as needed for diarrhea or loose stools. SUBSEQUENT DOSE GIVE ONE TAB AFTER EACH STOOL    [provider]  magnesium hydroxide (MILK OF MAGNESIA) 400 MG/5ML suspension Take 30 mLs by mouth daily as needed for mild constipation.    [provider]  magnesium  oxide (MAG-OX) 400 MG tablet Take 400 mg by mouth every morning. (1000)    [provider]  methimazole (TAPAZOLE) 5 MG tablet Take 1 tablet (5 mg total) by mouth daily. 11/11/17   Renato Shin, MD  metoprolol succinate (TOPROL-XL) 25 MG 24 hr tablet Take 25 mg by mouth daily.    [provider]  Multiple Vitamin (MULTIVITAMIN WITH MINERALS) TABS tablet Take 1 tablet by mouth daily. (0800)    [provider]  ondansetron (ZOFRAN) 4 MG tablet Take 4 mg by mouth every 6 (six) hours as needed for nausea or vomiting.    [provider]  pantoprazole (PROTONIX) 40 MG tablet Take 40 mg by mouth daily at 6 (six) AM.     [provider]  potassium chloride (KLOR-CON) 10 MEQ tablet Take 10 mEq by mouth every morning. 03/23/20   [provider]  predniSONE (DELTASONE) 2.5 MG tablet Take 2.5 mg by mouth every other day. (0800) Alternating days with 5 mg dose    [provider]  predniSONE (DELTASONE) 5 MG tablet Take 5 mg by mouth every other day. (0800) Alternating days with 2.5 mg dose    [provider]  Sodium Phosphates (RA SALINE ENEMA RE) Place 1 enema rectally daily as needed (constipation).    [provider]  solifenacin (VESICARE) 10 MG tablet Take 10 mg by mouth daily. (0800)    [provider]  spironolactone (ALDACTONE) 100 MG tablet Take 100 mg by mouth daily. (0800)    [provider]  TECFIDERA 240 MG CPDR Take 1 capsule (240 mg total) by mouth 2 (two) times daily. 12/07/19   Suzzanne Cloud, NP    ROS:  Out of a complete 14 system review of symptoms, the patient complains only of the following symptoms, and all other reviewed systems are negative.  Leg weakness Leg spasms Left shoulder pain  Blood  pressure 122/74, pulse 90, height 5\' 2"  (1.575 m), weight 178 lb 6.4 oz (80.9 kg).  Physical Exam  General: The patient is alert and cooperative at the time of the examination.  The patient is  moderately obese.  Skin: 2+ edema below the knees is seen bilaterally.   Neurologic Exam  Mental status: The patient is alert and oriented x 3 at the time of the examination. The patient has apparent normal recent and remote memory, with an apparently normal attention span and concentration ability.   Cranial nerves: Facial symmetry is present. Speech is normal, no aphasia or dysarthria is noted. Extraocular movements are full.  With superior gaze, there is divergence of the eyes without subjective double vision, there is exotropia of the left eye.  Visual fields are full.  Pupils are equal, round, and reactive to light.  Discs are flat bilaterally.  Motor: The patient has good strength in the upper extremities.  The patient has no voluntary movement of the lower extremities.  Sensory examination: Soft touch sensation is symmetric on the face and arms, decreased on the legs.  Coordination: The patient has good finger-nose-finger bilaterally, unable to perform heel-to-shin on either side.  Gait and station: The patient is wheelchair-bound, unable to stand or walk.  Reflexes: Deep tendon reflexes are symmetric, but are depressed.   Assessment/Plan:  1.  Multiple sclerosis  2.  Paraplegia  3.  Neurogenic bladder  The patient is stable on Tecfidera, would continue the medication for now.  The patient has had recent blood work done.  She will follow up here in 6 months.  Jill Alexanders MD 08/14/2020 11:23 AM  [facility] [address] [place], [place] [address]  Phone [phone] Fax [phone]

## 2020-08-14 NOTE — Telephone Encounter (Signed)
Returned the call to patient's facility, [facility].  Left name and number to return call back to.

## 2020-08-14 NOTE — Telephone Encounter (Signed)
Nurse from [facility] returned call Jodi Mourning).  Went over patient AVS and Dr. Tobey Grim recommendations.  No change to any medications, continue doing what she is doing and follow up with him or NP in 6 months.  Patient currently has a follow up appointment dated 8/15.  Nurse denied further questions, verbalized understanding and expressed appreciation for the phone call.

## 2020-08-31 NOTE — Patient Instructions (Addendum)
Preop instructions for:   Laura Roberts. [place]   Date of Birth:  Dec 30, 1940                     Date of Procedure:   09/13/2020 Procedure: CYSTOSCOPY BOTOX INJECTION SUPRAPUBIC TUBE UPSIZE AND EXCHANGE     Surgeon:  Dr. Link Snuffer Facility contact:  [facility]  Phone:   [place]: N/A RN contact name/phone#:                          and Fax #: [phone]   Transportation contact phone#: [facility]   Time to arrive at [facility]: 9: 00 AM   Report to: Admitting (On your left hand side)    Do not eat or drink past midnight the night before your procedure.(To include any tube feedings-must be discontinued)   Take these morning medications only with sips of water.(or give through gastrostomy or feeding tube).   Tecfidera, Pantoprazole, Methimazole, Duloxetine, Solifenacin, Metoprolol, Prednisone, Gabapentin.  Okay to use eyedrops   The Night Before Surgery:  Take only half normal dose of Lantus Insulin at bedtime   Note: No Insulin or Diabetic meds should be given or taken the morning of the procedure!     Please send day of procedure:current med list and meds last taken that day, confirm nothing by mouth status from what time, Patient Demographic info( to include DNR status, problem list, allergies)   Bring Insurance card and picture ID Leave all jewelry and other valuables at place where living( no metal or rings to be worn) No contact lens Women-no make-up, no lotions,perfumes,powders   Any questions day of procedure,call  SHORT STAY-[phone]     Sent from :[facility]                   [phone]                   Fax:[phone]   Sent by :      Norvel Richards, RN

## 2020-08-31 NOTE — Progress Notes (Signed)
COVID Vaccine Completed: Yes Date COVID Vaccine completed: 07/19/19, 08/16/19 Has received booster: 07/04/20 COVID vaccine manufacturer:    Moderna   Date of COVID positive in last 90 days:  PCP - [name], MD Cardiologist - N/A  Chest x-ray - greater than 1 year in epic EKG - greater than 1 year in epic Stress Test -  N/A ECHO -  N/A Cardiac Cath -  N/A Pacemaker/ICD device last checked: N/A  Sleep Study -  N/A CPAP -  N/A  Fasting Blood Sugar -  Checks Blood Sugar _____ times a day  Blood Thinner Instructions: N/A Aspirin Instructions: N/A Last Dose: N/A  Activity level:  paraplegic       Anesthesia review: Multiple sclerosis, HTN, DM  Patient denies shortness of breath, fever, cough and chest pain at PAT appointment   Patient verbalized understanding of instructions that were given to them at the PAT appointment. Patient was also instructed that they will need to review over the PAT instructions again at home before surgery.

## 2020-09-11 ENCOUNTER — Encounter (HOSPITAL_COMMUNITY): Payer: Self-pay | Admitting: Urology

## 2020-09-11 NOTE — Progress Notes (Signed)
Spoke to Laura Roberts at [facility] and confirmed that instructions had been received for surgery 09/13/20.

## 2020-09-11 NOTE — Progress Notes (Signed)
COVID Vaccine Completed: Yes Date COVID Vaccine completed: 07/19/19, 08/16/19 Has received booster: 07/04/20 COVID vaccine manufacturer:    Moderna    Date of COVID positive in last 90 days: N/A  PCP - [name], MD (last note on chart) Cardiologist - N/A  Chest x-ray - greater than 1 year in epic EKG - greater than 1 year in epic Stress Test -  N/A ECHO -  N/A Cardiac Cath -  N/A Pacemaker/ICD device last checked: N/A  Sleep Study -  N/A CPAP -  N/A  Fasting Blood Sugar - 116-248 Checks Blood Sugar - 4 times a day  Blood Thinner Instructions: N/A Aspirin Instructions: N/A Last Dose: N/A  Activity level:   paraplegic                                                   Anesthesia review: Multiple sclerosis, HTN, DM  Patient denies shortness of breath, fever, cough and chest pain at PAT appointment   Patient verbalized understanding of instructions that were given to them at the PAT appointment. Patient was also instructed that they will need to review over the PAT instructions again at home before surgery.

## 2020-09-13 ENCOUNTER — Ambulatory Visit (HOSPITAL_COMMUNITY): Payer: Medicare Other | Admitting: Physician Assistant

## 2020-09-13 ENCOUNTER — Encounter (HOSPITAL_COMMUNITY)
Admission: RE | Disposition: A | Discharge status: Home / Self Care | Payer: Self-pay | Source: Home / Self Care | Attending: Urology

## 2020-09-13 ENCOUNTER — Encounter (HOSPITAL_COMMUNITY): Payer: Self-pay | Admitting: Urology

## 2020-09-13 ENCOUNTER — Ambulatory Visit (HOSPITAL_COMMUNITY)
Admission: RE | Admit: 2020-09-13 | Discharge: 2020-09-13 | Disposition: A | Discharge status: Home / Self Care | Payer: Medicare Other | Attending: Urology | Admitting: Urology

## 2020-09-13 DIAGNOSIS — Z7952 Long term (current) use of systemic steroids: Secondary | ICD-10-CM | POA: Insufficient documentation

## 2020-09-13 DIAGNOSIS — Z20822 Contact with and (suspected) exposure to covid-19: Secondary | ICD-10-CM | POA: Diagnosis not present

## 2020-09-13 DIAGNOSIS — N319 Neuromuscular dysfunction of bladder, unspecified: Secondary | ICD-10-CM | POA: Diagnosis not present

## 2020-09-13 DIAGNOSIS — G35 Multiple sclerosis: Secondary | ICD-10-CM | POA: Insufficient documentation

## 2020-09-13 DIAGNOSIS — Z888 Allergy status to other drugs, medicaments and biological substances status: Secondary | ICD-10-CM | POA: Insufficient documentation

## 2020-09-13 DIAGNOSIS — Z79899 Other long term (current) drug therapy: Secondary | ICD-10-CM | POA: Insufficient documentation

## 2020-09-13 DIAGNOSIS — Z885 Allergy status to narcotic agent status: Secondary | ICD-10-CM | POA: Diagnosis not present

## 2020-09-13 HISTORY — DX: Neurogenic bowel, not elsewhere classified: K59.2

## 2020-09-13 HISTORY — PX: BOTOX INJECTION: SHX5754

## 2020-09-13 LAB — CBC
HCT: 42 % (ref 36.0–46.0)
Hemoglobin: 13.5 g/dL (ref 12.0–15.0)
MCH: 29 pg (ref 26.0–34.0)
MCHC: 32.1 g/dL (ref 30.0–36.0)
MCV: 90.3 fL (ref 80.0–100.0)
Platelets: 404 10*3/uL — ABNORMAL HIGH (ref 150–400)
RBC: 4.65 MIL/uL (ref 3.87–5.11)
RDW: 15.5 % (ref 11.5–15.5)
WBC: 12.2 10*3/uL — ABNORMAL HIGH (ref 4.0–10.5)
nRBC: 0 % (ref 0.0–0.2)

## 2020-09-13 LAB — HEMOGLOBIN A1C
Hgb A1c MFr Bld: 6.1 % — ABNORMAL HIGH (ref 4.8–5.6)
Mean Plasma Glucose: 128.37 mg/dL

## 2020-09-13 LAB — BASIC METABOLIC PANEL
Anion gap: 11 (ref 5–15)
BUN: 38 mg/dL — ABNORMAL HIGH (ref 8–23)
CO2: 28 mmol/L (ref 22–32)
Calcium: 9.8 mg/dL (ref 8.9–10.3)
Chloride: 99 mmol/L (ref 98–111)
Creatinine, Ser: 0.85 mg/dL (ref 0.44–1.00)
GFR, Estimated: >60 mL/min (ref >60)
Glucose, Bld: 124 mg/dL — ABNORMAL HIGH (ref 70–99)
Potassium: 4.3 mmol/L (ref 3.5–5.1)
Sodium: 138 mmol/L (ref 135–145)

## 2020-09-13 LAB — GLUCOSE, CAPILLARY: Glucose-Capillary: 125 mg/dL — ABNORMAL HIGH (ref 70–99)

## 2020-09-13 LAB — SARS CORONAVIRUS 2 BY RT PCR (HOSPITAL ORDER, PERFORMED IN ~~LOC~~ HOSPITAL LAB): SARS Coronavirus 2: NEGATIVE

## 2020-09-13 SURGERY — BOTOX INJECTION
Anesthesia: General

## 2020-09-13 MED ORDER — ACETAMINOPHEN 325 MG PO TABS: 325.0000 mg | ORAL_TABLET | ORAL | Status: DC | PRN

## 2020-09-13 MED ORDER — MEPERIDINE HCL 50 MG/ML IJ SOLN: 6.2500 mg | INTRAMUSCULAR | Status: DC | PRN

## 2020-09-13 MED ORDER — DEXAMETHASONE SODIUM PHOSPHATE 10 MG/ML IJ SOLN
INTRAMUSCULAR | Status: AC
  Filled 2020-09-13: qty 1

## 2020-09-13 MED ORDER — ONDANSETRON HCL 4 MG/2ML IJ SOLN: 4.0000 mg | Freq: Once | INTRAMUSCULAR | Status: DC | PRN

## 2020-09-13 MED ORDER — STERILE WATER FOR IRRIGATION IR SOLN
Status: DC | PRN
  Administered 2020-09-13: 3000 mL

## 2020-09-13 MED ORDER — ONABOTULINUMTOXINA 100 UNITS IJ SOLR
INTRAMUSCULAR | Status: AC
  Filled 2020-09-13: qty 100

## 2020-09-13 MED ORDER — ONDANSETRON HCL 4 MG/2ML IJ SOLN
INTRAMUSCULAR | Status: AC
  Filled 2020-09-13: qty 2

## 2020-09-13 MED ORDER — FENTANYL CITRATE (PF) 100 MCG/2ML IJ SOLN: 25.0000 ug | INTRAMUSCULAR | Status: DC | PRN

## 2020-09-13 MED ORDER — CHLORHEXIDINE GLUCONATE 0.12 % MT SOLN
15.0000 mL | Freq: Once | OROMUCOSAL | Status: AC
  Administered 2020-09-13: 15 mL via OROMUCOSAL

## 2020-09-13 MED ORDER — PROPOFOL 10 MG/ML IV BOLUS
INTRAVENOUS | Status: AC
  Filled 2020-09-13: qty 20

## 2020-09-13 MED ORDER — LACTATED RINGERS IV SOLN: INTRAVENOUS | Status: DC

## 2020-09-13 MED ORDER — LIDOCAINE 2% (20 MG/ML) 5 ML SYRINGE
INTRAMUSCULAR | Status: DC | PRN
  Administered 2020-09-13: 60 mg via INTRAVENOUS

## 2020-09-13 MED ORDER — ACETAMINOPHEN 160 MG/5ML PO SOLN: 325.0000 mg | ORAL | Status: DC | PRN

## 2020-09-13 MED ORDER — OXYCODONE HCL 5 MG/5ML PO SOLN: 5.0000 mg | Freq: Once | ORAL | Status: DC | PRN

## 2020-09-13 MED ORDER — OXYCODONE HCL 5 MG PO TABS: 5.0000 mg | ORAL_TABLET | Freq: Once | ORAL | Status: DC | PRN

## 2020-09-13 MED ORDER — DEXAMETHASONE SODIUM PHOSPHATE 10 MG/ML IJ SOLN
INTRAMUSCULAR | Status: DC | PRN
  Administered 2020-09-13: 8 mg via INTRAVENOUS

## 2020-09-13 MED ORDER — LIDOCAINE 2% (20 MG/ML) 5 ML SYRINGE
INTRAMUSCULAR | Status: AC
  Filled 2020-09-13: qty 5

## 2020-09-13 MED ORDER — PHENYLEPHRINE 40 MCG/ML (10ML) SYRINGE FOR IV PUSH (FOR BLOOD PRESSURE SUPPORT)
PREFILLED_SYRINGE | INTRAVENOUS | Status: DC | PRN
  Administered 2020-09-13 (×2): 200 ug via INTRAVENOUS
  Administered 2020-09-13: 120 ug via INTRAVENOUS

## 2020-09-13 MED ORDER — ONDANSETRON HCL 4 MG/2ML IJ SOLN
INTRAMUSCULAR | Status: DC | PRN
  Administered 2020-09-13: 4 mg via INTRAVENOUS

## 2020-09-13 MED ORDER — SODIUM CHLORIDE 0.9 % IV SOLN
1.0000 g | INTRAVENOUS | Status: AC
  Administered 2020-09-13: 1 g via INTRAVENOUS
  Filled 2020-09-13: qty 1

## 2020-09-13 MED ORDER — PROPOFOL 10 MG/ML IV BOLUS
INTRAVENOUS | Status: DC | PRN
  Administered 2020-09-13: 150 mg via INTRAVENOUS

## 2020-09-13 MED ORDER — SODIUM CHLORIDE (PF) 0.9 % IJ SOLN
INTRAMUSCULAR | Status: AC
  Filled 2020-09-13: qty 10

## 2020-09-13 MED ORDER — ONABOTULINUMTOXINA 100 UNITS IJ SOLR
INTRAMUSCULAR | Status: DC | PRN
  Administered 2020-09-13: 200 [IU]

## 2020-09-13 MED ORDER — ORAL CARE MOUTH RINSE: 15.0000 mL | Freq: Once | OROMUCOSAL | Status: AC

## 2020-09-13 SURGICAL SUPPLY — 14 items
BAG URO CATCHER STRL LF (MISCELLANEOUS) ×2 IMPLANT
CLOTH BEACON ORANGE TIMEOUT ST (SAFETY) ×2 IMPLANT
GLOVE SURG ENC MOIS LTX SZ7.5 (GLOVE) ×2 IMPLANT
GOWN STRL REUS W/TWL XL LVL3 (GOWN DISPOSABLE) ×4 IMPLANT
KIT TURNOVER KIT A (KITS) ×2 IMPLANT
MANIFOLD NEPTUNE II (INSTRUMENTS) ×2 IMPLANT
NDL ASPIRATION 22 (NEEDLE) ×1 IMPLANT
NDL SAFETY ECLIPSE 18X1.5 (NEEDLE) IMPLANT
NEEDLE ASPIRATION 22 (NEEDLE) ×2 IMPLANT
NEEDLE HYPO 18GX1.5 SHARP (NEEDLE)
PACK CYSTO (CUSTOM PROCEDURE TRAY) ×2 IMPLANT
SYR CONTROL 10ML LL (SYRINGE) IMPLANT
TUBING CONNECTING 10 (TUBING) IMPLANT
WATER STERILE IRR 3000ML UROMA (IV SOLUTION) ×2 IMPLANT

## 2020-09-13 NOTE — Discharge Instructions (Addendum)
Suprapubic tube may be exchanged monthly by her facility   Cystoscopy Cystoscopy is a procedure that is used to help diagnose and sometimes treat conditions that affect the lower urinary tract. The lower urinary tract includes the bladder and the urethra. The urethra is the tube that drains urine from the bladder. Cystoscopy is done using a thin, tube-shaped instrument with a light and camera at the end (cystoscope). The cystoscope may be hard or flexible, depending on the goal of the procedure. The cystoscope is inserted through the urethra, into the bladder. Cystoscopy may be recommended if you have:  Urinary tract infections that keep coming back.  Blood in the urine (hematuria).  An inability to control when you urinate (urinary incontinence) or an overactive bladder.  Unusual cells found in a urine sample.  A blockage in the urethra, such as a urinary stone.  Painful urination.  An abnormality in the bladder found during an intravenous pyelogram (IVP) or CT scan. Cystoscopy may also be done to remove a sample of tissue to be examined under a microscope (biopsy). Tell a health care provider about:  Any allergies you have.  All medicines you are taking, including vitamins, herbs, eye drops, creams, and over-the-counter medicines.  Any problems you or family members have had with anesthetic medicines.  Any blood disorders you have.  Any surgeries you have had.  Any medical conditions you have.  Whether you are pregnant or may be pregnant. What are the risks? Generally, this is a safe procedure. However, problems may occur, including:  Infection.  Bleeding.  Allergic reactions to medicines.  Damage to other structures or organs. What happens before the procedure? Medicines Ask your health care provider about:  Changing or stopping your regular medicines. This is especially important if you are taking diabetes medicines or blood thinners.  Taking medicines such as  aspirin and ibuprofen. These medicines can thin your blood. Do not take these medicines unless your health care provider tells you to take them.  Taking over-the-counter medicines, vitamins, herbs, and supplements. Tests You may have an exam or testing, such as:  X-rays of the bladder, urethra, or kidneys.  CT scan of the abdomen or pelvis.  Urine tests to check for signs of infection. General instructions  Follow instructions from your health care provider about eating or drinking restrictions.  Ask your health care provider what steps will be taken to help prevent infection. These steps may include: ? Washing skin with a germ-killing soap. ? Taking antibiotic medicine.  Plan to have a responsible adult take you home from the hospital or clinic. What happens during the procedure?  You will be given one or more of the following: ? A medicine to help you relax (sedative). ? A medicine to numb the area (local anesthetic).  The area around the opening of your urethra will be cleaned.  The cystoscope will be passed through your urethra into your bladder.  Germ-free (sterile) fluid will flow through the cystoscope to fill your bladder. The fluid will stretch your bladder so that your health care provider can clearly examine your bladder walls.  Your doctor will look at the urethra and bladder. Your doctor may take a biopsy or remove stones.  The cystoscope will be removed, and your bladder will be emptied. The procedure may vary among health care providers and hospitals.   What can I expect after the procedure? After the procedure, it is common to have:  Some soreness or pain in your abdomen and  urethra.  Urinary symptoms. These include: ? Mild pain or burning when you urinate. Pain should stop within a few minutes after you urinate. This may last for up to 1 week. ? A small amount of blood in your urine for several days. ? Feeling like you need to urinate but producing only a  small amount of urine. Follow these instructions at home: Medicines  Take over-the-counter and prescription medicines only as told by your health care provider.  If you were prescribed an antibiotic medicine, take it as told by your health care provider. Do not stop taking the antibiotic even if you start to feel better. General instructions  Return to your normal activities as told by your health care provider. Ask your health care provider what activities are safe for you.  If you were given a sedative during the procedure, it can affect you for several hours. Do not drive or operate machinery until your health care provider says that it is safe.  Watch for any blood in your urine. If the amount of blood in your urine increases, call your health care provider.  Follow instructions from your health care provider about eating or drinking restrictions.  If a tissue sample was removed for testing (biopsy) during your procedure, it is up to you to get your test results. Ask your health care provider, or the department that is doing the test, when your results will be ready.  Drink enough fluid to keep your urine pale yellow.  Keep all follow-up visits. This is important. Contact a health care provider if:  You have pain that gets worse or does not get better with medicine, especially pain when you urinate.  You have trouble urinating.  You have more blood in your urine. Get help right away if:  You have blood clots in your urine.  You have abdominal pain.  You have a fever or chills.  You are unable to urinate. Summary  Cystoscopy is a procedure that is used to help diagnose and sometimes treat conditions that affect the lower urinary tract.  Cystoscopy is done using a thin, tube-shaped instrument with a light and camera at the end.  After the procedure, it is common to have some soreness or pain in your abdomen and urethra.  Watch for any blood in your urine. If the amount  of blood in your urine increases, call your health care provider.  If you were prescribed an antibiotic medicine, take it as told by your health care provider. Do not stop taking the antibiotic even if you start to feel better. This information is not intended to replace advice given to you by your health care provider. Make sure you discuss any questions you have with your health care provider. Document Revised: 01/28/2020 Document Reviewed: 01/28/2020 Elsevier Patient Education  2021 [place] Anesthesia, Adult, Care After This sheet gives you information about how to care for yourself after your procedure. Your health care provider may also give you more specific instructions. If you have problems or questions, contact your health care provider. What can I expect after the procedure? After the procedure, the following side effects are common:  Pain or discomfort at the IV site.  Nausea.  Vomiting.  Sore throat.  Trouble concentrating.  Feeling cold or chills.  Feeling weak or tired.  Sleepiness and fatigue.  Soreness and body aches. These side effects can affect parts of the body that were not involved in surgery. Follow these instructions at  home: For the time period you were told by your health care provider:  Rest.  Do not participate in activities where you could fall or become injured.  Do not drive or use machinery.  Do not drink alcohol.  Do not take sleeping pills or medicines that cause drowsiness.  Do not make important decisions or sign legal documents.  Do not take care of children on your own.   Eating and drinking  Follow any instructions from your health care provider about eating or drinking restrictions.  When you feel hungry, start by eating small amounts of foods that are soft and easy to digest (bland), such as toast. Gradually return to your regular diet.  Drink enough fluid to keep your urine pale yellow.  If you vomit,  rehydrate by drinking water, juice, or clear broth. General instructions  If you have sleep apnea, surgery and certain medicines can increase your risk for breathing problems. Follow instructions from your health care provider about wearing your sleep device: ? Anytime you are sleeping, including during daytime naps. ? While taking prescription pain medicines, sleeping medicines, or medicines that make you drowsy.  Have a responsible adult stay with you for the time you are told. It is important to have someone help care for you until you are awake and alert.  Return to your normal activities as told by your health care provider. Ask your health care provider what activities are safe for you.  Take over-the-counter and prescription medicines only as told by your health care provider.  If you smoke, do not smoke without supervision.  Keep all follow-up visits as told by your health care provider. This is important. Contact a health care provider if:  You have nausea or vomiting that does not get better with medicine.  You cannot eat or drink without vomiting.  You have pain that does not get better with medicine.  You are unable to pass urine.  You develop a skin rash.  You have a fever.  You have redness around your IV site that gets worse. Get help right away if:  You have difficulty breathing.  You have chest pain.  You have blood in your urine or stool, or you vomit blood. Summary  After the procedure, it is common to have a sore throat or nausea. It is also common to feel tired.  Have a responsible adult stay with you for the time you are told. It is important to have someone help care for you until you are awake and alert.  When you feel hungry, start by eating small amounts of foods that are soft and easy to digest (bland), such as toast. Gradually return to your regular diet.  Drink enough fluid to keep your urine pale yellow.  Return to your normal activities  as told by your health care provider. Ask your health care provider what activities are safe for you. This information is not intended to replace advice given to you by your health care provider. Make sure you discuss any questions you have with your health care provider. Document Revised: 03/02/2020 Document Reviewed: 09/30/2019 Elsevier Patient Education  2021 [employer].

## 2020-09-13 NOTE — H&P (Signed)
CC/HPI: cc: Neurogenic bladder  HPI:  10/31/17  A 80 year old female with a history of MS and neurogenic bladder. He recently underwent a suprapubic tube placement on 09/09/2017. She was admitted to the hospital with cellulitis around the suprapubic tube site and was seen by Dr. Junious Silk. She has improved from this. She had a SP tube change on 10/13/2017. This was exchanged for a 14 [place] tube. She presents for follow-up. She was recently hospitalized for a groin abscess that was incised and drained. She is happy now with her suprapubic tube. She has mild leakage around the catheter but not much. Infection has improved.   11/17/2017:  Patient presents today for suprapubic change. She has no other complaints.   12/03/17: Patient presents today with complaints of decreased urinary output from her SP tube site and increased urethral leakage. She states that this has been ongoing for the past week. She is having to wear 2 adult diapers per day, which are typically soaked. She denies any current suprapubic discomfort or abdominal pain. She denies fever or gross hematuria. No nausea or vomiting. She currently uses Detrol for bladder spasms.   12/23/17: She returns today with complaints of increased urinary leakage per her urethra. She remains on Detrol. She was seen in the ED on 6/14 after her SP catheter came out and was not able to be replaced her rehab facility. Per her report, they had a difficult time replaced the catheter and a 12 Fr. SP catheter was eventually placed. Today she states that it intermittently drains well. She states that is draining okay currently, but was not draining well this morning. She notes that when it is not draining, she is having increased leakage per her urethra. She denies fevers or gross hematuria. No suprapubic discomfort.   01/06/2018:  The patient continues with a suprapubic catheter but continues to have significant leakage per urethra. She is interested and entered detrusor  Botox. She has failed oral agents including Detrol.   05/05/2018:  Patient is status post cystoscopy with Botox instillation on 01/30/2018. She initially did well with this. Urine leakage decreased. Of note, during the surgery, she had a very patulous urethra and I was able to insert a finger into the bladder. However, Botox did improve her symptoms of leakage and bladder spasms. Over the past few days, she had an increase in spasms and leakage and the catheter was not draining but after manipulation her catheter has gone back to normal.+   07/06/18: She returns today for follow up. She is s/p Botox instillation as noted above. She presents today with complications regarding SP tube. She states that her SP tube came out late Saturday evening and they were unable to replace this at her SNF. She states that it was draining well up until that point in time. Until this point, she states she has been having some leakage per her urethra intermittently, but generally felt that she had been doing well. Since SP catheter has been out, she has had significant amount of leakage per her urethra since that time and is soaking diapers. No fevers or chills.   07/23/18:  Patient is s/p replacement of SPT, which she underwent on 07/15/18. She c/o bladder spasms. She takes [employer]. SPT has been draining clear, yellow urine.   08/04/2018  Patient had her suprapubic tube replaced on 07/15/2018. She wants it up size. Unfortunately, cannot upsize currently since it has only been 2 weeks and the tract is not mature. She has not had  Botox in about 6 months. She desires this as well.   07/04/2020  Patient underwent Botox with suprapubic tube exchange on 04/10/2020. Her facility tried change the tube and failed. Therefore, she had to go to interventional Radiology last month for a guided suprapubic tube placement. She is scheduled for up sizing on 08/01/2020.Her catheter is draining but she is having some leakage from her urethra. She  has a very patulous urethra.   09/13/2020 Interventional radiology placed an 18 French catheter on 08/01/2020.  She has done very well with this catheter.  She does not want to upsize any further.  She is happy with the 58 [place].  She presents for Botox and suprapubic tube exchange.    ALLERGIES: Codeine Januvia Tramadol - Hives    MEDICATIONS: Omeprazole 20 mg tablet, delayed release  Acetaminophen  Amlodipine Besylate 10 mg tablet  Artificial Tears  Baclofen 10 mg tablet  Calcium Citrate  Cymbalta 30 mg capsule,delayed release  Duloxetine Hcl 30 mg capsule,delayed release  Gabapentin 300 mg capsule  Glimepiride 2 mg tablet  Ipratropium Bromide  Lantus  Lidoderm  Methimazole 5 mg tablet  Minerin cream  Miralax  Mucinex D 600 mg-60 mg tablet, extended release 12 hr  Pantoprazole Sodium 40 mg tablet, delayed release  Prednisone 5 mg tablet  Prednisone 5 mg tablet  Prednisone 2.5 mg tablet  Restasis 0.05 % dropperette, single-use drop dispenser  Senna  Solifenacin Succinate 10 mg tablet  Tecfidera 240 mg capsule,delayed release  Tinactin  Trulicity  Vitamin D3     Notes: Detrol LA rx faxed to [facility]   GU PSH: Compl Change SP Tube - 09/06/2019, 01/30/2018, 2019 Cystourethroscopy, W/Injection For Chemodenervation Of Bladder - 04/10/2020, 09/06/2019, 05/10/2019, 01/30/2018 Simple Change SP Tube - 04/10/2020, 05/10/2019, 2019, 2019     NON-GU PSH: No Non-GU PSH    GU PMH: Bladder, Neuromuscular dysfunction, Unspec - 07/23/2018 Urinary Tract Inf, Unspec site - 07/23/2018 Detrusor overactivity - 01/06/2018 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   Tobacco Use Assessment Completed: Used Tobacco in last 30 days? Does not use smokeless tobacco. Has never drank.  Does not use drugs. Does not drink caffeine. Has not had a blood transfusion.    REVIEW OF SYSTEMS:    GU Review Female:   Patient denies frequent urination, hard to postpone urination, burning /pain with urination, get up at night to urinate, leakage of urine, stream starts and stops, trouble starting your stream, have to strain to urinate, and being pregnant.  Gastrointestinal (Upper):   Patient denies nausea, vomiting, and indigestion/ heartburn.  Gastrointestinal (Lower):   Patient denies diarrhea and constipation.  Constitutional:   Patient denies fever, night sweats, weight loss, and fatigue.  Skin:   Patient denies skin rash/ lesion and itching.  Eyes:   Patient denies blurred vision and double vision.  Ears/ Nose/ Throat:   Patient denies sore throat and sinus problems.  Hematologic/Lymphatic:   Patient denies swollen glands and easy bruising.  Cardiovascular:   Patient denies leg swelling and chest pains.  Respiratory:   Patient denies cough and shortness of breath.  Endocrine:   Patient denies excessive  thirst.  Musculoskeletal:   Patient denies back pain and joint pain.  Neurological:   Patient denies headaches and dizziness.  Psychologic:   Patient denies depression and anxiety.   BP 139/79 (BP Location: Left Arm)   Pulse 84   Temp 97.7 F (36.5 C) (Oral)   Resp 18   Ht 5\' 2"  (1.575 m)   Wt 76.7 kg   SpO2 99%   BMI 30.91 kg/m   MULTI-SYSTEM PHYSICAL EXAMINATION:    Gastrointestinal: Suprapubic tube draining clear yellow urine     Complexity of Data:  Source Of History:  Patient  Records Review:   Previous Doctor Records, Previous Hospital Records   PROCEDURES: None   ASSESSMENT:      ICD-10 Details  1 GU:   Bladder, Neuromuscular dysfunction, Unspec -  N31.9 Chronic, Stable  2   Areflexic bladder - N31.2 Chronic, Stable  3   Incontinence w/o Sensation - N39.42 Chronic, Stable   PLAN:           Schedule Procedure: Unspecified Date at [facility] - 29199 - Botox 200mg  (Botulinum Toxin Type A, Per Unit) - 46568, L2751          Document Letter(s):  Created for Patient: Clinical Summary    to take her to the operating room for repeat intravesical Botox with suprapubic tube exchange under anesthesia

## 2020-09-13 NOTE — Anesthesia Preprocedure Evaluation (Addendum)
Anesthesia Evaluation  Patient identified by MRN, date of birth, ID band Patient awake    Reviewed: Allergy & Precautions, NPO status , Patient's Chart, lab work & pertinent test results  Airway Mallampati: I  TM Distance: >3 FB Neck ROM: Full    Dental  (+) Teeth Intact, Poor Dentition, Missing,    Pulmonary neg pulmonary ROS,    Pulmonary exam normal        Cardiovascular hypertension, Pt. on medications and Pt. on home beta blockers Normal cardiovascular exam     Neuro/Psych PSYCHIATRIC DISORDERS Depression  Neuromuscular disease    GI/Hepatic Neg liver ROS, GERD  Medicated and Controlled,  Endo/Other  diabetes, Type 2  Renal/GU Renal disease  negative genitourinary   Musculoskeletal  (+) Arthritis , Osteoarthritis,    Abdominal   Peds  Hematology negative hematology ROS (+)   Anesthesia Other Findings   Reproductive/Obstetrics                            Anesthesia Physical  Anesthesia Plan  ASA: III  Anesthesia Plan: General   Post-op Pain Management:    Induction: Intravenous  PONV Risk Score and Plan: 3 and Midazolam, Ondansetron and Treatment may vary due to age or medical condition  Airway Management Planned: LMA  Additional Equipment: None  Intra-op Plan:   Post-operative Plan: Extubation in OR  Informed Consent: I have reviewed the patients History and Physical, chart, labs and discussed the procedure including the risks, benefits and alternatives for the proposed anesthesia with the patient or authorized representative who has indicated his/her understanding and acceptance.       Plan Discussed with: CRNA, Surgeon and Anesthesiologist  Anesthesia Plan Comments:        Anesthesia Quick Evaluation

## 2020-09-13 NOTE — Anesthesia Postprocedure Evaluation (Signed)
Anesthesia Post Note  Patient: Laura Roberts  Procedure(s) Performed: CYSTOSCOPYBOTOX INJECTION SUPRAPUBIC TUBE UPSIZE AND EXCHANGE (N/A )     Patient location during evaluation: PACU Anesthesia Type: General Level of consciousness: awake and alert Pain management: pain level controlled Vital Signs Assessment: post-procedure vital signs reviewed and stable Respiratory status: spontaneous breathing, nonlabored ventilation and respiratory function stable Cardiovascular status: blood pressure returned to baseline and stable Postop Assessment: no apparent nausea or vomiting Anesthetic complications: no   No complications documented.  Last Vitals:  Vitals:   09/13/20 1315 09/13/20 1330  BP: 117/73 115/69  Pulse: 88 83  Resp: 16 12  Temp:    SpO2: 93% 94%    Last Pain:  Vitals:   09/13/20 1315  TempSrc:   PainSc: 0-No pain                 Audry Pili

## 2020-09-13 NOTE — Anesthesia Procedure Notes (Signed)
Procedure Name: Intubation Performed by: Rosaland Lao, CRNA Pre-anesthesia Checklist: Patient identified, Emergency Drugs available, Suction available and Patient being monitored Patient Re-evaluated:Patient Re-evaluated prior to induction Oxygen Delivery Method: Circle system utilized Preoxygenation: Pre-oxygenation with 100% oxygen Induction Type: IV induction LMA: LMA inserted LMA Size: 4.0 Tube type: Oral Number of attempts: 1 Placement Confirmation: positive ETCO2 and breath sounds checked- equal and bilateral Tube secured with: Tape Dental Injury: Teeth and Oropharynx as per pre-operative assessment

## 2020-09-13 NOTE — Transfer of Care (Signed)
Immediate Anesthesia Transfer of Care Note  Patient: Laura Roberts  Procedure(s) Performed: CYSTOSCOPYBOTOX INJECTION SUPRAPUBIC TUBE UPSIZE AND EXCHANGE (N/A )  Patient Location: PACU  Anesthesia Type:General  Level of Consciousness: awake, alert  and oriented  Airway & Oxygen Therapy: Patient Spontanous Breathing and Patient connected to face mask  Post-op Assessment: VSS  Post vital signs: Reviewed and stable  Last Vitals:  Vitals Value Taken Time  BP    Temp    Pulse    Resp    SpO2      Last Pain:  Vitals:   09/13/20 0936  TempSrc:   PainSc: 0-No pain         Complications: No complications documented.

## 2020-09-13 NOTE — Op Note (Signed)
Operative Note  Preoperative diagnosis:  1.  Neurogenic bladder  Postoperative diagnosis: 1.  Neurogenic bladder  Procedure(s): 1.  Cystoscopy with 200 units of Botox 2.  Suprapubic catheter exchange, simple  Surgeon: Link Snuffer, MD  Assistants: None  Anesthesia: General  Complications: None immediate  EBL: Minimal  Specimens: 1.  None  Drains/Catheters: 1.  18 French suprapubic tube  Intraoperative findings: 1.  Wide open urethra and bladder neck consistent with history of Foley catheter prolonged use.  Low capacity bladder with some edema but no obvious mass.  Indication: 80 year old female with neurogenic bladder recently had suprapubic tube replaced by interventional radiology followed by upsizing to 18 [place].  She presents for the above operation.  Description of procedure:  The patient was identified and consent was obtained.  The patient was taken to the operating room and placed in the supine position.  The patient was placed under general anesthesia.  Perioperative antibiotics were administered.  The patient was placed in dorsal lithotomy.  Patient was prepped and draped in a standard sterile fashion and a timeout was performed.  An 44 French catheter was advanced through the suprapubic tract and into the bladder and 10 cc of sterile water was instilled into the catheter balloon.  The catheter was placed easily.    A 21 French cystoscope was advanced into the bladder and then 200 units of Botox was systematically injected into the detrusor.  I withdrew the scope and this concluded the operation.  Patient tolerated the procedure well and was stable postoperatively.  Plan: Follow-up in 6 months with repeat Botox.  Suprapubic tube may be exchanged by her facility monthly.

## 2020-09-13 NOTE — Progress Notes (Signed)
Pharmacy Antibiotic Note  Laura Roberts is a 80 y.o. female admitted on 09/13/2020 with surgical prophylaxis.  Pharmacy has been consulted for meropenem dosing.  Plan: Meropenem 1 gm x 1 dose preop Rx to sign off  Height: 5\' 2"  (157.5 cm) Weight: 76.7 kg (169 lb) IBW/kg (Calculated) : 50.1  Temp (24hrs), Avg:97.7 F (36.5 C), Min:97.7 F (36.5 C), Max:97.7 F (36.5 C)  No results for input(s): WBC, CREATININE, LATICACIDVEN, VANCOTROUGH, VANCOPEAK, VANCORANDOM, GENTTROUGH, GENTPEAK, GENTRANDOM, TOBRATROUGH, TOBRAPEAK, TOBRARND, AMIKACINPEAK, AMIKACINTROU, AMIKACIN in the last 168 hours.  CrCl cannot be calculated (Patient's most recent lab result is older than the maximum 21 days allowed.).    Allergies  Allergen Reactions  . Codeine Other (See Comments)    Difficult breathing and skin problem  . Ultram [Tramadol] Other (See Comments)    Difficult breathing and skin peeling  . Januvia [Sitagliptin] Rash and Other (See Comments)    Blisters   Eudelia Bunch, Pharm.D 09/13/2020 9:14 AM

## 2020-09-14 ENCOUNTER — Encounter (HOSPITAL_COMMUNITY): Payer: Self-pay | Admitting: Urology

## 2020-09-19 ENCOUNTER — Other Ambulatory Visit (HOSPITAL_COMMUNITY): Payer: Medicare Other

## 2020-09-22 ENCOUNTER — Other Ambulatory Visit: Payer: Self-pay

## 2020-09-22 ENCOUNTER — Non-Acute Institutional Stay: Payer: Medicare Other | Admitting: Hospice

## 2020-09-22 DIAGNOSIS — Z515 Encounter for palliative care: Secondary | ICD-10-CM

## 2020-09-22 DIAGNOSIS — N3289 Other specified disorders of bladder: Secondary | ICD-10-CM

## 2020-09-22 DIAGNOSIS — G35 Multiple sclerosis: Secondary | ICD-10-CM

## 2020-09-22 NOTE — Progress Notes (Signed)
[facility] Consult Note Telephone: [phone]  Fax: [phone]  PATIENT NAME: Laura Roberts DOB: Jun 21, 1941 MRN: [id number]  PRIMARY CARE PROVIDER:   [name], MD [name], [address] [place],  [place] [address]  REFERRING PROVIDER: [name], MD [name], [address] [place],  [place] [address]  RESPONSIBLE PARTY:Self Extended Emergency Contact Information Primary Emergency Contact: Kinkaid,Leroy III [place], [place] Phone: [phone] Relation: Son Secondary Emergency Contact: Betters,Tyrone [place] Phone: [phone] Mobile Phone: [phone] Relation: Son  Visit is to build trust and highlight Palliative Medicine as specialized medical care for people living with serious illness, aimed at facilitating better quality of life through symptoms relief, assisting with advance care plan and establishing goals of care.   CHIEF COMPLAINT: Palliative focused visit/bladder spasm  RECOMMENDATIONS/PLAN:    Code Status: Patient affirmed she is a DNR. Signed DNR in facility chart and in [facility].  Goals of Care: Goals of care include to maximize quality of life and symptom management. MOST form selections  include DO NOT RESUSCITATE, limited additional interventions, IV fluids as indicated, antibiotics as indicated, no feeding tube.  Signed MOST form in facility chart; same document uploaded to epic today. Visit consisted of counseling and education dealing with the complex and emotionally intense issues of symptom management and palliative care in the setting of serious and potentially life-threatening illness. Palliative medicine will continue to support patient,patient's family and the medical team.  I spent 20 minutes providing this consult.  More than 50% of the time was spent on counseling and coordinating  communication. --------------------------------------------------------------------------------------------- 3. Symptom management/Plan:  Bladder spasm: Continue Neurontin and Baclofen as ordered. Suprpubic cath in place. Last Botox injection 09/13/2020. Continue to see Urologist as planned.  Patient remains on Tecfidera for MS with no adverse reactions. Patient denied spasm at this time; spasmwell managed with neurontin and baclofen. Patientisbedbound, hoyer lift for transfers, able to feed self after set up. She deniesnew numbness or weakness or changes to the bowels or bladder. Nursing staff with no concerns. Paraplegia of the lower extremities associated with MS. Encouraged ongoing care.  Continue Trulicity and Lantus for diabetes mellitus.She denies moodiness,continue on Cymbalta for depression;she participates in [place] and other facility activities. She loves to color and has some colored works on display in her room.   Palliative will continue to monitor for symptom management/decline and make recommendations as needed. Return 2 months or prn. Encouraged to call provider sooner with any concerns.   HISTORY OF PRESENT ILLNESS:  Laura Roberts is a 80 y.o. female with multiple medical problems including bladder spasm related to multiple sclerosis/neurogenic bladder; mostly intermittent.  Patient reports bladder spasms negatively impacts her quality of life; it is ameliorated by Botox injection and baclofen. History of MS, paraplegia, depression, diabetes mellitus, hypertension, Neurogenic bladder.  History obtained from review of EMR, discussion with patient/family.   Review and summarization of Epic records shows history from other than patient. Rest of 10 point ROS asked and negative.  Palliative Care was asked to follow this patient by consultation request of [name], MD to help address complex decision making in the context of advance care planning and goals of care  clarification.   CODE STATUS: DNR  PPS: 30%  HOSPICE ELIGIBILITY/DIAGNOSIS: TBD  PAST MEDICAL HISTORY:  Past Medical History:  Diagnosis Date  . Age related osteoporosis   . Aphasia   .  Bronchitis    hx of   . Cellulitis and abscess of toe    hx of   . Constipation   . Cystostomy in place [facility])   . Degenerative arthritis    osteoarthritis   . Depression   . Diabetes mellitus without complication ([facility])    type 2   . Edema    localized   . Full incontinence of feces   . Gait disturbance   . GERD (gastroesophageal reflux disease)   . Gout    hx of   . History of falling   . Hyperlipidemia   . Hypertension   . Hyperthyroidism   . Multiple sclerosis ([facility])   . Neurogenic bowel   . Neuromuscular disorder (H[facility])    Bilateral hand carpal tunnel syndrome  . Neuromuscular dysfunction of bladder   . Nontoxic multinodular goiter   . Obesity   . Osteoporosis   . Other adrenocortical insufficiency ([place])   . Paraplegia (H[facility])    LEGS  . Personal history of urinary (tract) infections   . Polyneuropathy   . Pressure ulcer of right buttock, stage 4 ([place])   . Spinal stenosis in cervical region   . Spinal stenosis of lumbosacral region   . Spinal stenosis, thoracic   . Tinea unguium   . Vitamin D deficiency     SOCIAL HX: @SOCX  Patient at SNF  for ongoing care   FAMILY HX:  Family History  Problem Relation Age of Onset  . Multiple sclerosis Other        neices.   . Cancer Mother   . Diabetes Mother   . GI Bleed Sister        diverticulitis  . Thyroid disease Neg Hx     Review lab tests/diagnostics No results for input(s): WBC, HGB, HCT, PLT, MCV in the last 168 hours. No results for input(s): NA, K, CL, CO2, BUN, CREATININE, GLUCOSE in the last 168 hours. Latest GFR by Cockcroft Gault (not valid in AKI or ESRD) Estimated Creatinine Clearance: 53.1 mL/min (by C-G formula based on SCr of 0.85 mg/dL). No results for input(s): AST, ALT, ALKPHOS, GGT in the last 168  hours.  Invalid input(s): TBILI, CONJBILI, ALB, TOTALPROTEIN No components found for: ALB No results for input(s): APTT, INR in the last 168 hours.  Invalid input(s): PTPATIENT No results for input(s): BNP, PROBNP in the last 168 hours.  ALLERGIES:  Allergies  Allergen Reactions  . Codeine Other (See Comments)    Difficult breathing and skin problem  . Ultram [Tramadol] Other (See Comments)    Difficult breathing and skin peeling  . Januvia [Sitagliptin] Rash and Other (See Comments)    Blisters      PERTINENT MEDICATIONS:  Outpatient Encounter Medications as of 09/22/2020  Medication Sig  . acetaminophen (TYLENOL) 325 MG tablet Take 650 mg by mouth every 6 (six) hours as needed for mild pain or fever.   . baclofen (LIORESAL) 10 MG tablet Take 10-15 mg by mouth daily. (0800 & 1200) Take 1.5 tablets (15 mg) by mouth in the morning & take 1 tablet (10 mg) by mouth at noon.  . baclofen (LIORESAL) 20 MG tablet Take 20 mg by mouth at bedtime. (2000)  . barrier cream (NON-SPECIFIED) CREA Apply 1 application topically 2 (two) times daily. (0700 & 1900) Apply to right ischium and bilateral buttocks  . bisacodyl (DULCOLAX) 10 MG suppository Place 10 mg rectally as needed for moderate constipation.  . calcium citrate (CALCITRATE - DOSED IN  MG ELEMENTAL CALCIUM) 950 MG tablet Take 200 mg of elemental calcium by mouth daily. (0800)  . Carboxymeth-Glycerin-Polysorb 0.5-1-0.5 % SOLN Apply 1 drop to eye 4 (four) times daily. (0800,1200, 1600 & 2000)  . cholecalciferol (VITAMIN D) 1000 UNITS tablet Take 1,000 Units by mouth daily. (0800)  . cycloSPORINE (RESTASIS) 0.05 % ophthalmic emulsion Place 1 drop into both eyes 2 (two) times daily. (0800 & 2000)  . Dulaglutide (TRULICITY) 3 SP/2.3RA SOPN Inject 3 mg into the skin every Friday. (0800)  . DULoxetine (CYMBALTA) 30 MG capsule Take 30 mg by mouth daily. (0800) Give along with 60 mg to = 90 mg  . DULoxetine (CYMBALTA) 60 MG capsule Take 60 mg by  mouth daily. (0800)Give along with 30 mg to = 90 mg  . furosemide (LASIX) 40 MG tablet Take 40 mg by mouth daily. (0800)  . gabapentin (NEURONTIN) 400 MG capsule Take 400 mg by mouth 3 (three) times daily. (0800, 1400, & 2000)  . guaiFENesin (MUCINEX) 600 MG 12 hr tablet Take 1,200 mg by mouth at bedtime. (2000)  . insulin glargine (LANTUS) 100 UNIT/ML Solostar Pen Inject 18 Units into the skin at bedtime. (2000)  . loperamide (IMODIUM) 2 MG capsule Take 2 mg by mouth as needed for diarrhea or loose stools (SUBSEQUENT DOSE GIVE ONE TAB AFTER EACH STOOL (max 8 mg/24 hrs.)).  Marland Kitchen magnesium hydroxide (MILK OF MAGNESIA) 400 MG/5ML suspension Take 30 mLs by mouth daily as needed for mild constipation.  . magnesium oxide (MAG-OX) 400 MG tablet Take 400 mg by mouth every morning. (1000)  . methimazole (TAPAZOLE) 5 MG tablet Take 1 tablet (5 mg total) by mouth daily. (Patient taking differently: Take 5 mg by mouth daily at 6 (six) AM. (0600))  . metoprolol succinate (TOPROL-XL) 50 MG 24 hr tablet Take 50 mg by mouth daily. (0800)  . Multiple Vitamin (MULTIVITAMIN WITH MINERALS) TABS tablet Take 1 tablet by mouth daily. (0800)  . ondansetron (ZOFRAN) 4 MG tablet Take 4 mg by mouth every 6 (six) hours as needed for nausea or vomiting.  . pantoprazole (PROTONIX) 40 MG tablet Take 40 mg by mouth daily at 6 (six) AM. (0600)  . potassium chloride (KLOR-CON) 10 MEQ tablet Take 10 mEq by mouth every morning. (1000)  . predniSONE (DELTASONE) 2.5 MG tablet Take 2.5 mg by mouth every other day. (0800) Alternating days with 5 mg dose  . predniSONE (DELTASONE) 5 MG tablet Take 5 mg by mouth every other day. (0800) Alternating days with 2.5 mg dose  . Sodium Phosphates (RA ENEMA) 7-19 GM/118ML ENEM Place 118 mLs rectally daily as needed (severe constipation).  . solifenacin (VESICARE) 10 MG tablet Take 10 mg by mouth daily. (0800)  . spironolactone (ALDACTONE) 100 MG tablet Take 100 mg by mouth daily. (0800)  . TECFIDERA  240 MG CPDR Take 1 capsule (240 mg total) by mouth 2 (two) times daily. (Patient taking differently: Take 240 mg by mouth 2 (two) times daily. (0800 & 2000))   No facility-administered encounter medications on file as of 09/22/2020.    ROS  General: NAD EYES: No vision changes ENMT: No dysphagia no xerostomia Cardiovascular: No chest pain Pulmonary: No cough, SOB  Abdomen:no constipation or diarrhea GU: Endorses intermittent bladder spasm MSK:   ROM limitations, no falls reported Skin: No rashes or wounds Neurological: Endorses weakness Psych: Endorses positive mood Heme/lymph/immuno: No bruises or abnormal bleeding   PHYSICAL EXAM  General: In no acute distress, cooperative Cardiovascular: regular rate and rhythm  Pulmonary: no cough, no increased work of breathing, normal respiratory effort Abdomen: soft, non tender, positive bowel sounds in all quadrants GU:  no suprapubic tenderness; suprapubic Size 18 French in place, patient with likely low urine Eyes: Normal lids, no discharge, sclera anicteric ENMT: Moist mucous membranes Musculoskeletal: Trace edema in BLE Skin: no rash to visible skin, warm without cyanosis Psych: non-anxious affect Neurological: Weakness but otherwise non focal Heme/lymph/immuno: no bruises, no bleeding   Thank you for the opportunity to participate in the care of SHERLEEN PANGBORN Please call our office at [phone] if we can be of additional assistance.  Note: Portions of this note were generated with [employer]. Dictation errors may occur despite best attempts at proofreading.  Teodoro Spray, NP

## 2020-11-09 ENCOUNTER — Telehealth: Payer: Self-pay | Admitting: Emergency Medicine

## 2020-11-09 NOTE — Telephone Encounter (Signed)
Prescription faxed to [facility] for [place]  OK transmission received

## 2020-11-15 ENCOUNTER — Telehealth: Payer: Self-pay | Admitting: Neurology

## 2020-11-15 NOTE — Telephone Encounter (Signed)
Pt is requesting a refill for TECFIDERA 240 MG CPDR t.  Pharmacy:  [facility]

## 2020-11-16 ENCOUNTER — Other Ambulatory Visit: Payer: Self-pay | Admitting: Emergency Medicine

## 2020-11-16 MED ORDER — DIMETHYL FUMARATE 240 MG PO CPDR: 240.0000 mg | DELAYED_RELEASE_CAPSULE | Freq: Two times a day (BID) | ORAL | 11 refills | Status: DC

## 2020-11-16 NOTE — Telephone Encounter (Signed)
LVM for patient to return call.  If patient calls back, please inform her that a new prescription was sent to [facility] for [place] on 11/09/20.  If she needs anything else, please let me know.  Thank you Doroteo Bradford, RN

## 2020-12-05 ENCOUNTER — Telehealth: Payer: Self-pay | Admitting: Neurology

## 2020-12-05 NOTE — Telephone Encounter (Signed)
Prescription sent to [facility] 11/06/20.  Patient changed pharmacies.  This is old pharmacy

## 2020-12-05 NOTE — Telephone Encounter (Signed)
[facility]) request refill Dimethyl Fumarate (TECFIDERA) 240 MG CPDR at [place].

## 2020-12-06 ENCOUNTER — Other Ambulatory Visit: Payer: Self-pay | Admitting: Emergency Medicine

## 2020-12-06 MED ORDER — DIMETHYL FUMARATE 240 MG PO CPDR: 240.0000 mg | DELAYED_RELEASE_CAPSULE | Freq: Two times a day (BID) | ORAL | 11 refills | Status: DC

## 2020-12-13 ENCOUNTER — Telehealth: Payer: Self-pay | Admitting: *Deleted

## 2020-12-13 NOTE — Telephone Encounter (Signed)
Received refill fax from [facility] for tecfidera, called [facility], spoke with Eddie North who stated per her records patient getting brand. They  got Rx from [place] on 11/16/20 for generic dimethyl fumarate for a starter dose, but she's been on therapy already, started dose not needed.   She's been getting tecfidera brand from them since 2019. [facility] Rx to [facility] in EMR for dimethyl fumarate, will discuss with Doroteo Bradford RN as her note states [facility]  is new pharmacy for patient.

## 2020-12-14 NOTE — Telephone Encounter (Signed)
Called [facility] back and informed them patient has changed pharmacies.

## 2020-12-14 NOTE — Telephone Encounter (Signed)
Otila Kluver @[facility]  [phone]) is calling now for the refill on pt's [place]

## 2020-12-20 NOTE — Telephone Encounter (Signed)
Faxed meication clarification request to [facility].  OK transmission received.

## 2021-01-16 ENCOUNTER — Non-Acute Institutional Stay: Payer: Medicare Other | Admitting: Hospice

## 2021-01-16 ENCOUNTER — Other Ambulatory Visit: Payer: Self-pay

## 2021-01-16 DIAGNOSIS — N3289 Other specified disorders of bladder: Secondary | ICD-10-CM

## 2021-01-16 DIAGNOSIS — Z515 Encounter for palliative care: Secondary | ICD-10-CM

## 2021-01-16 DIAGNOSIS — G35 Multiple sclerosis: Secondary | ICD-10-CM

## 2021-01-16 NOTE — Progress Notes (Signed)
[facility] Consult Note Telephone: [phone]  Fax: [phone]  PATIENT NAME: Laura Roberts DOB: 05/18/1941 MRN: [id number]  PRIMARY CARE PROVIDER:   [name], MD [name], [place],  [place] [address]  REFERRING PROVIDER: [name], MD [name], [place],  [place] [address]  RESPONSIBLE PARTY:  Self Contact Information     Name Relation Home Work Mobile   [place] Son [phone]     [place] [phone]  [phone]       Visit is to build trust and highlight Palliative Medicine as specialized medical care for people living with serious illness, aimed at facilitating better quality of life through symptoms relief, assisting with advance care planning and complex medical decision making. This is a follow up visit.  RECOMMENDATIONS/PLAN:    Code Status: Patient affirmed she is a DNR. Signed DNR in facility chart and in [place].   Goals of Care: Goals of care include to maximize quality of life and symptom management. MOST form selections  include DO NOT RESUSCITATE, limited additional interventions, IV fluids as indicated, antibiotics as indicated, no feeding tube  Visit consisted of counseling and education dealing with the complex and emotionally intense issues of symptom management and palliative care in the setting of serious and potentially life-threatening illness. Palliative care team will continue to support patient, patient's family, and medical team.  Symptom management/Plan:  Multiple sclerosis: Managed with Tecfidera, Neurontin, baclofen. Continue with restorative exercises and ongoing occupational therapy.  Neurogenic bladder: Suprapubic cath in place.  Followed by urologist.  Gets Botox injections.   Bladder spasm: Patient reports bladder spasms under control with the Botox injections and baclofen and  Solifenacin. Type 2 diabetes mellitus: Continue Trulicity and Lantus as ordered.  Last A1c 6.1 09/13/2020 Depression:She continues on Cymbalta, followed by psych.  Follow up: Palliative care will continue to follow for complex medical decision making, advance care planning, and clarification of goals. Return 6 weeks or prn. Encouraged to call provider sooner with any concerns.  CHIEF COMPLAINT: Palliative follow up  HISTORY OF PRESENT ILLNESS:  Laura Roberts a 80 y.o. female with multiple medical problems including multiple sclerosis, neurogenic bladder, paraplegia, type 2 diabetes mellitus, depression, hypertension.  Patient reports occasional bladder spasm which she said is well managed with current medications; no respiratory distress, no chest pain/discomfort.;  Not able to ambulate due to MS progression, bedbound, Hoyer lift for transfers.  History obtained from review of EMR, discussion with primary team, family and/or patient. All 10 point systems reviewed and are negative except as documented in history of present illness above  Review and summarization of Epic records shows history from other than patient.   Palliative Care was asked to follow this patient o help address complex decision making in the context of advance care planning and goals of care clarification.   PHYSICAL EXAM  General: In no acute distress, cooperative Cardiovascular: regular rate and rhythm, no edema in BLE Pulmonary: no cough, no increased work of breathing, normal respiratory effort Abdomen: soft, non tender, positive bowel sounds in all quadrants GU:  no suprapubic tenderness; suprapubic Size 18 French in place, patient with light yellow urine Eyes: Normal lids, no discharge, sclera anicteric ENMT: Moist mucous membranes Musculoskeletal: bed bound, hoyer lift for transfers.  Skin: no rash to visible skin, warm without cyanosis Psych: non-anxious affect Neurological: Weakness but otherwise non  focal Heme/lymph/immuno: no bruises,  no bleeding  PERTINENT MEDICATIONS:  Outpatient Encounter Medications as of 01/16/2021  Medication Sig   acetaminophen (TYLENOL) 325 MG tablet Take 650 mg by mouth every 6 (six) hours as needed for mild pain or fever.    baclofen (LIORESAL) 10 MG tablet Take 10-15 mg by mouth daily. (0800 & 1200) Take 1.5 tablets (15 mg) by mouth in the morning & take 1 tablet (10 mg) by mouth at noon.   baclofen (LIORESAL) 20 MG tablet Take 20 mg by mouth at bedtime. (2000)   barrier cream (NON-SPECIFIED) CREA Apply 1 application topically 2 (two) times daily. (0700 & 1900) Apply to right ischium and bilateral buttocks   bisacodyl (DULCOLAX) 10 MG suppository Place 10 mg rectally as needed for moderate constipation.   calcium citrate (CALCITRATE - DOSED IN MG ELEMENTAL CALCIUM) 950 MG tablet Take 200 mg of elemental calcium by mouth daily. (0800)   Carboxymeth-Glycerin-Polysorb 0.5-1-0.5 % SOLN Apply 1 drop to eye 4 (four) times daily. (0800,1200, 1600 & 2000)   cholecalciferol (VITAMIN D) 1000 UNITS tablet Take 1,000 Units by mouth daily. (0800)   cycloSPORINE (RESTASIS) 0.05 % ophthalmic emulsion Place 1 drop into both eyes 2 (two) times daily. (0800 & 2000)   Dimethyl Fumarate (TECFIDERA) 240 MG CPDR Take 1 capsule (240 mg total) by mouth 2 (two) times daily. (0800 & 2000)   Dulaglutide (TRULICITY) 3 LX/7.2IO SOPN Inject 3 mg into the skin every Friday. (0800)   DULoxetine (CYMBALTA) 30 MG capsule Take 30 mg by mouth daily. (0800) Give along with 60 mg to = 90 mg   DULoxetine (CYMBALTA) 60 MG capsule Take 60 mg by mouth daily. (0800)Give along with 30 mg to = 90 mg   furosemide (LASIX) 40 MG tablet Take 40 mg by mouth daily. (0800)   gabapentin (NEURONTIN) 400 MG capsule Take 400 mg by mouth 3 (three) times daily. (0800, 1400, & 2000)   guaiFENesin (MUCINEX) 600 MG 12 hr tablet Take 1,200 mg by mouth at bedtime. (2000)   insulin glargine (LANTUS) 100 UNIT/ML Solostar Pen  Inject 18 Units into the skin at bedtime. (2000)   loperamide (IMODIUM) 2 MG capsule Take 2 mg by mouth as needed for diarrhea or loose stools (SUBSEQUENT DOSE GIVE ONE TAB AFTER EACH STOOL (max 8 mg/24 hrs.)).   magnesium hydroxide (MILK OF MAGNESIA) 400 MG/5ML suspension Take 30 mLs by mouth daily as needed for mild constipation.   magnesium oxide (MAG-OX) 400 MG tablet Take 400 mg by mouth every morning. (1000)   methimazole (TAPAZOLE) 5 MG tablet Take 1 tablet (5 mg total) by mouth daily. (Patient taking differently: Take 5 mg by mouth daily at 6 (six) AM. (0600))   metoprolol succinate (TOPROL-XL) 50 MG 24 hr tablet Take 50 mg by mouth daily. (0800)   Multiple Vitamin (MULTIVITAMIN WITH MINERALS) TABS tablet Take 1 tablet by mouth daily. (0800)   ondansetron (ZOFRAN) 4 MG tablet Take 4 mg by mouth every 6 (six) hours as needed for nausea or vomiting.   pantoprazole (PROTONIX) 40 MG tablet Take 40 mg by mouth daily at 6 (six) AM. (0600)   potassium chloride (KLOR-CON) 10 MEQ tablet Take 10 mEq by mouth every morning. (1000)   predniSONE (DELTASONE) 2.5 MG tablet Take 2.5 mg by mouth every other day. (0800) Alternating days with 5 mg dose   predniSONE (DELTASONE) 5 MG tablet Take 5 mg by mouth every other day. (0800) Alternating days with 2.5 mg dose   Sodium Phosphates (RA ENEMA)  7-19 GM/118ML ENEM Place 118 mLs rectally daily as needed (severe constipation).   solifenacin (VESICARE) 10 MG tablet Take 10 mg by mouth daily. (0800)   spironolactone (ALDACTONE) 100 MG tablet Take 100 mg by mouth daily. (0800)   No facility-administered encounter medications on file as of 01/16/2021.    HOSPICE ELIGIBILITY/DIAGNOSIS: TBD  PAST MEDICAL HISTORY:  Past Medical History:  Diagnosis Date   Age related osteoporosis    Aphasia    Bronchitis    hx of    Cellulitis and abscess of toe    hx of    Constipation    Cystostomy in place [facility])    Degenerative arthritis    osteoarthritis    Depression     Diabetes mellitus without complication (HCC)    type 2    Edema    localized    Full incontinence of feces    Gait disturbance    GERD (gastroesophageal reflux disease)    Gout    hx of    History of falling    Hyperlipidemia    Hypertension    Hyperthyroidism    Multiple sclerosis (HCC)    Neurogenic bowel    Neuromuscular disorder (HCC)    Bilateral hand carpal tunnel syndrome   Neuromuscular dysfunction of bladder    Nontoxic multinodular goiter    Obesity    Osteoporosis    Other adrenocortical insufficiency (HCC)    Paraplegia (HCC)    LEGS   Personal history of urinary (tract) infections    Polyneuropathy    Pressure ulcer of right buttock, stage 4 (HCC)    Spinal stenosis in cervical region    Spinal stenosis of lumbosacral region    Spinal stenosis, thoracic    Tinea unguium    Vitamin D deficiency       ALLERGIES:  Allergies  Allergen Reactions   Codeine Other (See Comments)    Difficult breathing and skin problem   Ultram [Tramadol] Other (See Comments)    Difficult breathing and skin peeling   Januvia [Sitagliptin] Rash and Other (See Comments)    Blisters      I spent 50 minutes providing this consultation; this includes time spent with patient/family, chart review and documentation. More than 50% of the time in this consultation was spent on counseling and coordinating communication   Thank you for the opportunity to participate in the care of Laura Roberts Please call our office at [phone] if we can be of additional assistance.  Note: Portions of this note were generated with [employer]. Dictation errors may occur despite best attempts at proofreading.  Teodoro Spray, NP

## 2021-01-25 ENCOUNTER — Telehealth: Payer: Self-pay | Admitting: Neurology

## 2021-01-25 ENCOUNTER — Other Ambulatory Visit: Payer: Self-pay

## 2021-01-25 MED ORDER — TECFIDERA 240 MG PO CPDR: 240.0000 mg | DELAYED_RELEASE_CAPSULE | Freq: Two times a day (BID) | ORAL | 11 refills | Status: DC

## 2021-01-25 NOTE — Telephone Encounter (Signed)
Pt states that her pharmacy informed her that they are switching her from the name brand to the generic of her Dulaglutide (TRULICITY) 3 0000000 SOPN Pt would like to know why this change. Please advise.

## 2021-01-25 NOTE — Telephone Encounter (Signed)
After review of the chart again with Megan B, it looks as if the prescription was accidentally prescribed as a generic. I contacted pt pharmacy which was listed and charted as [facility] in previous notes. Spent 22 mins on the phone with them for them to tell me they are not filling this prescription for this pt they were reported that [facility] was. I contacted them and did a verbal order to give the pt brand name Tecfidera 60 tablets, 11 refills. Contacted pt back to inform her this matter has been taken care of. She was very Patent attorney.

## 2021-01-25 NOTE — Telephone Encounter (Addendum)
Contacted pt to inform her that we do not manage nor prescribe this medication in our office. She stated that wasn't the correct medication, she was referring to Tecfidera. Pt was on Brand April 20, 2019-11/06/20 and has been on generic since and prior to brand. Will contact pt back to inform her why she was switched back to generic as I can not find anything in her chart regarding it.

## 2021-01-26 ENCOUNTER — Encounter: Payer: Self-pay | Admitting: Internal Medicine

## 2021-01-26 ENCOUNTER — Non-Acute Institutional Stay (SKILLED_NURSING_FACILITY): Payer: Medicare Other | Admitting: Internal Medicine

## 2021-01-26 DIAGNOSIS — E1140 Type 2 diabetes mellitus with diabetic neuropathy, unspecified: Secondary | ICD-10-CM | POA: Diagnosis not present

## 2021-01-26 DIAGNOSIS — I10 Essential (primary) hypertension: Secondary | ICD-10-CM

## 2021-01-26 DIAGNOSIS — F339 Major depressive disorder, recurrent, unspecified: Secondary | ICD-10-CM

## 2021-01-26 DIAGNOSIS — E0590 Thyrotoxicosis, unspecified without thyrotoxic crisis or storm: Secondary | ICD-10-CM

## 2021-01-26 DIAGNOSIS — Z794 Long term (current) use of insulin: Secondary | ICD-10-CM

## 2021-01-26 DIAGNOSIS — L89159 Pressure ulcer of sacral region, unspecified stage: Secondary | ICD-10-CM

## 2021-01-26 NOTE — Assessment & Plan Note (Addendum)
Psych NP monitoring her; on maintenance Cymbalta. Clinically she is stable.

## 2021-01-26 NOTE — Assessment & Plan Note (Signed)
11/09/20 TSH 2.10, therapeutic

## 2021-01-26 NOTE — Progress Notes (Signed)
NURSING HOME LOCATION:  [facility] ROOM NUMBER:  [id number]  CODE STATUS:  DNR  PCP:  Lorette Ang [facility] NP  This is a nursing facility follow up visit of chronic medical diagnoses & to document compliance with Regulation 483.30 (c) in [employer] [place] Manual Phase 2 which mandates caregiver visit ( visits can alternate among physician, PA or NP as per statutes) within 10 days of 30 days / 60 days/ 90 days post admission to SNF date    Interim medical record and care since last SNF visit was updated with review of diagnostic studies and change in clinical status since last visit were documented.  HPI: She is a permanent resident of this facility with medical diagnoses of multiple sclerosis, neurogenic bladder, depression, and type 2 diabetes with neurologic complications.  The MS is managed with Tecfidera, Neurontin, and baclofen.  Restorative exercises and ongoing occupational therapy are conducted at the facility.  Dr. Jannifer Franklin monitors her MS periodically. Dr. Gloriann Loan monitors the neurogenic bladder.  Suprapubic cath is in place.  Dr. Gloriann Loan provides Botox injections; most recent injection was 09/13/2020.  She was on baclofen and Solifenacin for the bladder spasms.   Trulicity and Lantus are ordered for her diabetes.  Cymbalta is a maintenance drug for her depression.  Psych NP follows her periodically.   Palliative care is seeing the patient intermittently in attempt to determine long-term plans.  She remains DNR. Most recent chemistry and electrolyte results were conducted 01/03/2021.  Sodium was 142, creatinine 0.46/GFR > 90, and osmolality 284, low normal.  Most recent CBC revealed a normochromic, normocytic anemia with H/H of 11.9/35.5.  H/H has varied slightly serially. Most recent A1c was 6% which is high normal or prediabetes on 09/07/2020.  Review of systems: Despite the mild anemia, she denies any bleeding dyscrasias.  There is no change in her neurologic  status.  She has an appointment with Dr. Jannifer Franklin next month.  When asked about the status of her depression she stated "I am maintaining".  She states that her son initiated the palliative care consult. Urologic status is stable.  She is scheduled to see Dr. Gloriann Loan in October. She believes the sacral ulcer was improving;but Wound Care Nurse states that it is actually slightly worse.  Constitutional: No fever, significant weight change  Eyes: No redness, discharge, pain, vision change ENT/mouth: No nasal congestion,  purulent discharge, earache, change in hearing, sore throat  Cardiovascular: No chest pain, palpitations, paroxysmal nocturnal dyspnea, claudication, edema  Respiratory: No cough, sputum production, hemoptysis, significant snoring, apnea   Gastrointestinal: No heartburn, dysphagia, abdominal pain, nausea /vomiting, rectal bleeding, melena, change in bowels Musculoskeletal: No joint stiffness, joint swelling Dermatologic: No rash, pruritus, change in appearance of skin Neurologic: No dizziness, headache, syncope, seizures Psychiatric: No significant insomnia, anorexia Endocrine: No change in hair/skin/nails, excessive thirst, excessive hunger  Hematologic/lymphatic: No significant bruising, lymphadenopathy, abnormal bleeding Allergy/immunology: No itchy/watery eyes, significant sneezing, urticaria, angioedema  Physical exam:  Pertinent or positive findings: She sits in the motorized wheelchair with no muscular activity of the lower extremities.  Affect is slightly flat.  Minimal asymmetric ptosis is present.  Breath sounds are decreased.  Pedal pulses are also decreased.  She has trace edema at the ankles.  General appearance: Adequately nourished; no acute distress, increased work of breathing is present.   Lymphatic: No lymphadenopathy about the head, neck, axilla. Eyes: No conjunctival inflammation or lid edema is present. There is no scleral icterus.  Ears:  External ear exam shows  no significant lesions or deformities.   Nose:  External nasal examination shows no deformity or inflammation. Nasal mucosa are pink and moist without lesions, exudates Oral exam:  Lips and gums are healthy appearing. There is no oropharyngeal erythema or exudate. Neck:  No thyromegaly, masses, tenderness noted.    Heart:  Normal rate and regular rhythm. S1 and S2 normal without gallop, murmur, click, rub .  Lungs:  without wheezes, rhonchi, rales, rubs. Abdomen: Bowel sounds are normal. Abdomen is soft and nontender with no organomegaly, hernias, masses. GU: Deferred  Extremities:  No cyanosis  Neurologic exam :Balance, Rhomberg, finger to nose testing could not be completed due to clinical state Skin: Warm & dry w/o tenting. No significant visable rash.  See summary under each active problem in the Problem List with associated updated therapeutic plan

## 2021-01-26 NOTE — Assessment & Plan Note (Signed)
BP controlled; no change in antihypertensive medications  

## 2021-01-26 NOTE — Patient Instructions (Signed)
See assessment and plan under each diagnosis in the problem list and acutely for this visit 

## 2021-01-26 NOTE — Assessment & Plan Note (Addendum)
DM with neuro complications Most current A1c:09/07/20 was 6% A1c goal : 8% No hypoglycemia No change indicated but A1c update indicated

## 2021-01-26 NOTE — Assessment & Plan Note (Signed)
SNF Wound Care Nurse monitoring resident. She reports stasis ulcer slightly worse Refer to [facility] if progressive

## 2021-01-28 ENCOUNTER — Encounter: Payer: Self-pay | Admitting: Internal Medicine

## 2021-01-30 ENCOUNTER — Other Ambulatory Visit: Payer: Self-pay | Admitting: Urology

## 2021-02-12 ENCOUNTER — Ambulatory Visit: Payer: Medicare Other | Admitting: Neurology

## 2021-02-13 ENCOUNTER — Ambulatory Visit: Payer: Medicare Other | Admitting: Neurology

## 2021-02-20 ENCOUNTER — Ambulatory Visit (INDEPENDENT_AMBULATORY_CARE_PROVIDER_SITE_OTHER): Payer: Medicare Other | Admitting: Neurology

## 2021-02-20 ENCOUNTER — Telehealth: Payer: Self-pay | Admitting: Neurology

## 2021-02-20 ENCOUNTER — Encounter: Payer: Self-pay | Admitting: Neurology

## 2021-02-20 ENCOUNTER — Other Ambulatory Visit: Payer: Self-pay

## 2021-02-20 VITALS — BP 107/75 | HR 93 | Ht 62.0 in

## 2021-02-20 DIAGNOSIS — G8220 Paraplegia, unspecified: Secondary | ICD-10-CM | POA: Diagnosis not present

## 2021-02-20 DIAGNOSIS — N319 Neuromuscular dysfunction of bladder, unspecified: Secondary | ICD-10-CM

## 2021-02-20 DIAGNOSIS — G35 Multiple sclerosis: Secondary | ICD-10-CM

## 2021-02-20 MED ORDER — DULOXETINE HCL 60 MG PO CPEP: 60.0000 mg | ORAL_CAPSULE | Freq: Two times a day (BID) | ORAL | 1 refills | Status: DC

## 2021-02-20 MED ORDER — ALPRAZOLAM 0.5 MG PO TABS: ORAL_TABLET | ORAL | 0 refills | Status: DC

## 2021-02-20 NOTE — Progress Notes (Signed)
Reason for visit: Multiple sclerosis, paraplegia  Laura Roberts is an 80 y.o. female  History of present illness:  Laura Roberts is a 80 year old right-handed black female with a history of multiple sclerosis.  The patient has extensive brain and spinal cord involvement with a paraplegia.  She has neurogenic bowel and bladder.  She has prediabetes with a hemoglobin A1c of 6.1.  She resides in an extended care facility, she comes in today with EMS on a stretcher.  The patient has been on Tecfidera, she said she could not get the medication for about 2 weeks, this occurred about a month ago.  Since that time, she has had generalized increase in pain with a burning sensation and occasional shock sensations throughout the arms and legs and abdomen.  The patient has had no alteration in strength of the arms or any change in speech or vision.  She has intrinsic left shoulder disease and has difficulty elevating the left arm because of this.  She has been seen through orthopedic surgery previously.  The patient has a suprapubic catheter in place, she denies any significant issues with urinary tract infections.  The patient does report some occasional spasms of the muscles the lower extremities.  She is on baclofen.  At some point in May or June, the gabapentin dose was reduced as it was causing drowsiness.  It is not clear if the generalized pain worsened shortly after this or not.  Past Medical History:  Diagnosis Date   Age related osteoporosis    Aphasia    Bronchitis    hx of    Cellulitis and abscess of toe    hx of    Constipation    Cystostomy in place [facility])    Degenerative arthritis    osteoarthritis    Depression    Diabetes mellitus without complication (HCC)    type 2    Edema    localized    Full incontinence of feces    Gait disturbance    GERD (gastroesophageal reflux disease)    Gout    hx of    History of falling    Hyperlipidemia    Hypertension    Hyperthyroidism     Multiple sclerosis (HCC)    Neurogenic bowel    Neuromuscular disorder (HCC)    Bilateral hand carpal tunnel syndrome   Neuromuscular dysfunction of bladder    Nontoxic multinodular goiter    Obesity    Osteoporosis    Other adrenocortical insufficiency (HCC)    Paraplegia (HCC)    LEGS   Personal history of urinary (tract) infections    Polyneuropathy    Pressure ulcer of right buttock, stage 4 (HCC)    Spinal stenosis in cervical region    Spinal stenosis of lumbosacral region    Spinal stenosis, thoracic    Tinea unguium    Vitamin D deficiency     Past Surgical History:  Procedure Laterality Date   ABDOMINAL HYSTERECTOMY     BACK SURGERY     BOTOX INJECTION N/A 01/30/2018   Procedure: CYSTOSCOPY BOTOX INJECTION, SUPRAPUBIC EXCHANGE;  Surgeon: Lucas Mallow, MD;  Location: [facility];  Service: Urology;  Laterality: N/A;   BOTOX INJECTION N/A 09/02/2018   Procedure: BOTOX INJECTION;  Surgeon: Lucas Mallow, MD;  Location: [facility];  Service: Urology;  Laterality: N/A;   BOTOX INJECTION N/A 05/10/2019   Procedure: CYSTOSCOPY BOTOX INJECTION, SUPRAPUBIC EXCHANGE;  Surgeon: Lucas Mallow, MD;  Location: [facility];  Service: Urology;  Laterality: N/A;   BOTOX INJECTION N/A 09/06/2019   Procedure: CYSTOSCOPY BOTOX INJECTION;  Surgeon: Lucas Mallow, MD;  Location: [facility];  Service: Urology;  Laterality: N/A;   BOTOX INJECTION N/A 04/10/2020   Procedure: CYSTOSCOPY BOTOX INJECTION;  Surgeon: Festus Aloe, MD;  Location: [facility];  Service: Urology;  Laterality: N/A;   BOTOX INJECTION N/A 09/13/2020   Procedure: CYSTOSCOPYBOTOX INJECTION SUPRAPUBIC TUBE UPSIZE AND EXCHANGE;  Surgeon: Lucas Mallow, MD;  Location: [facility];  Service: Urology;  Laterality: N/A;   CATARACT EXTRACTION Right    CHOLECYSTECTOMY     CYSTOSCOPY N/A 09/02/2018   Procedure: CYSTOSCOPY;  Surgeon: Lucas Mallow, MD;  Location: [facility];  Service: Urology;  Laterality: N/A;   CYSTOSTOMY N/A 09/02/2018    Procedure: CYSTOSTOMY SUPRAPUBIC;  Surgeon: Lucas Mallow, MD;  Location: [facility];  Service: Urology;  Laterality: N/A;  45 MINS   FEMUR IM NAIL Right 06/02/2014   Procedure: INTRAMEDULLARY (IM) RETROGRADE FEMORAL NAILING;  Surgeon: Johnny Bridge, MD;  Location: [place];  Service: Orthopedics;  Laterality: Right;   GROIN DISSECTION Left 10/24/2017   Procedure: IRRIGATION AND DEBRIDEMENT, LEFT THIGH ABCESS ;  Surgeon: Alphonsa Overall, MD;  Location: [facility];  Service: General;  Laterality: Left;   INSERTION OF SUPRAPUBIC CATHETER N/A 09/06/2019   Procedure: SUPRAPUBIC TUBE CHANGE;  Surgeon: Lucas Mallow, MD;  Location[facility];  Service: Urology;  Laterality: N/A;   INSERTION OF SUPRAPUBIC CATHETER N/A 04/10/2020   Procedure: SUPRAPUBIC CATHETER EXCHANGE/TRACT  DILATION;  Surgeon: Festus Aloe, MD;  Location: [facility];  Service: Urology;  Laterality: N/A;   IR CATHETER TUBE CHANGE  10/13/2017   IR CATHETER TUBE CHANGE  07/15/2018   IR CATHETER TUBE CHANGE  08/26/2019   IR CATHETER TUBE CHANGE  11/25/2019   IR CATHETER TUBE CHANGE  01/06/2020   IR CATHETER TUBE CHANGE  06/13/2020   IR CATHETER TUBE CHANGE  08/01/2020   left hand carpal tunnel surgery     REPLACEMENT TOTAL KNEE BILATERAL  2004    Family History  Problem Relation Age of Onset   Multiple sclerosis Other        neices.    Cancer Mother    Diabetes Mother    GI Bleed Sister        diverticulitis   Thyroid disease Neg Hx     Social history:  reports that she has never smoked. She has never used smokeless tobacco. She reports that she does not drink alcohol and does not use drugs.    Allergies  Allergen Reactions   Codeine Other (See Comments)    Difficult breathing and skin problem   Ultram [Tramadol] Other (See Comments)    Difficult breathing and skin peeling   Januvia [Sitagliptin] Rash and Other (See Comments)    Blisters    Medications:  Prior to Admission medications   Medication Sig Start Date End Date  Taking? Authorizing Provider  acetaminophen (TYLENOL) 325 MG tablet Take 650 mg by mouth every 6 (six) hours as needed for mild pain or fever.    Yes [provider]  Amino Acids-Protein Hydrolys (FEEDING SUPPLEMENT, PRO-STAT SUGAR FREE 64,) LIQD Take 30 mLs by mouth once.   Yes [provider]  baclofen (LIORESAL) 10 MG tablet Take 10-15 mg by mouth daily. (0800 & 1200) Take 1.5 tablets (15 mg) by mouth in the morning & take 1 tablet (  10 mg) by mouth at noon.   Yes [provider]  baclofen (LIORESAL) 20 MG tablet Take 20 mg by mouth at bedtime. (2000)   Yes [provider]  barrier cream (NON-SPECIFIED) CREA Apply 1 application topically 2 (two) times daily. (0700 & 1900) Apply to right ischium and bilateral buttocks   Yes [provider]  calcium citrate (CALCITRATE - DOSED IN MG ELEMENTAL CALCIUM) 950 MG tablet Take 200 mg of elemental calcium by mouth daily. (0800)   Yes [provider]  Carboxymeth-Glycerin-Polysorb 0.5-1-0.5 % SOLN Apply 1 drop to eye 4 (four) times daily. (0800,1200, 1600 & 2000)   Yes [provider]  cycloSPORINE (RESTASIS) 0.05 % ophthalmic emulsion Place 1 drop into both eyes 2 (two) times daily. (0800 & 2000)   Yes [provider]  Dulaglutide (TRULICITY) 3 0000000 SOPN Inject 3 mg into the skin every Friday. (0800)   Yes [provider]  DULoxetine (CYMBALTA) 30 MG capsule Take 30 mg by mouth daily. (0800) Give along with 60 mg to = 90 mg   Yes [provider]  DULoxetine (CYMBALTA) 60 MG capsule Take 60 mg by mouth daily. (0800)Give along with 30 mg to = 90 mg   Yes [provider]  furosemide (LASIX) 40 MG tablet Take 40 mg by mouth daily. (0800)   Yes [provider]  gabapentin (NEURONTIN) 400 MG capsule Take 400 mg by mouth 3 (three) times daily. (0800, 1400, & 2000)   Yes [provider]  guaiFENesin (MUCINEX) 600 MG 12 hr tablet Take 1,200 mg by  mouth at bedtime. (2000)   Yes [provider]  insulin glargine (LANTUS) 100 UNIT/ML Solostar Pen Inject 18 Units into the skin at bedtime. (2000)   Yes [provider]  loperamide (IMODIUM) 2 MG capsule Take 2 mg by mouth as needed for diarrhea or loose stools (SUBSEQUENT DOSE GIVE ONE TAB AFTER EACH STOOL (max 8 mg/24 hrs.)).   Yes [provider]  magnesium hydroxide (MILK OF MAGNESIA) 400 MG/5ML suspension Take 30 mLs by mouth daily as needed for mild constipation.   Yes [provider]  methimazole (TAPAZOLE) 5 MG tablet Take 1 tablet (5 mg total) by mouth daily. Patient taking differently: Take 5 mg by mouth daily at 6 (six) AM. (0600) 11/11/17  Yes Renato Shin, MD  metoprolol succinate (TOPROL-XL) 50 MG 24 hr tablet Take 50 mg by mouth daily. (0800)   Yes [provider]  Multiple Vitamin (MULTIVITAMIN WITH MINERALS) TABS tablet Take 1 tablet by mouth daily. (0800)   Yes [provider]  ondansetron (ZOFRAN) 4 MG tablet Take 4 mg by mouth every 6 (six) hours as needed for nausea or vomiting.   Yes [provider]  pantoprazole (PROTONIX) 40 MG tablet Take 40 mg by mouth daily at 6 (six) AM. (0600)   Yes [provider]  potassium chloride (KLOR-CON) 10 MEQ tablet Take 10 mEq by mouth every morning. (1000) 03/23/20  Yes [provider]  predniSONE (DELTASONE) 2.5 MG tablet Take 2.5 mg by mouth every other day. (0800) Alternating days with 5 mg dose   Yes [provider]  predniSONE (DELTASONE) 5 MG tablet Take 5 mg by mouth every other day. (0800) Alternating days with 2.5 mg dose   Yes [provider]  SF 5000 PLUS 1.1 % CREA dental cream Take by mouth. 12/13/20  Yes [provider]  Sodium Phosphates (RA ENEMA) 7-19 GM/118ML ENEM Place 118 mLs  rectally daily as needed (severe constipation).   Yes [provider]  solifenacin (VESICARE) 10 MG tablet Take 10 mg by mouth daily.  (0800)   Yes [provider]  TECFIDERA 240 MG CPDR Take 1 capsule (240 mg total) by mouth 2 (two) times daily. (0800 & 2000) 01/25/21  Yes Kathrynn Ducking, MD  vitamin C (ASCORBIC ACID) 500 MG tablet Take 500 mg by mouth daily.   Yes [provider]  zinc sulfate 220 (50 Zn) MG capsule Take 220 mg by mouth daily.   Yes [provider]    ROS:  Out of a complete 14 system review of symptoms, the patient complains only of the following symptoms, and all other reviewed systems are negative.  Weakness in the legs Increased pain Left shoulder joint pain  Blood pressure 107/75, pulse 93, height '5\' 2"'$  (1.575 m).  Physical Exam  General: The patient is alert and cooperative at the time of the examination.  The patient is markedly obese.  Skin: 2+ edema below the knees is seen bilaterally.   Neurologic Exam  Mental status: The patient is alert and oriented x 3 at the time of the examination. The patient has apparent normal recent and remote memory, with an apparently normal attention span and concentration ability.   Cranial nerves: Facial symmetry is present. Speech is normal, no aphasia or dysarthria is noted. Extraocular movements are full. Visual fields are full.  Pupils are equal, round, and reactive to light.  Discs are flat bilaterally.  Motor: The patient has good strength the upper 4 extremities.  The patient has impairment with elevation of the left arm and decreased mobility across the left shoulder.  The patient has no voluntary movement of the lower extremities.  Sensory examination: Soft touch sensation is symmetric on the face and arms, the patient has decreased sensation in the legs.  Coordination: The patient has good finger-nose-finger bilaterally.  She is unable to form heel-to-shin with the legs.  Gait and station: The patient is nonambulatory.  Reflexes: Deep tendon reflexes are symmetric, but are depressed.   Assessment/Plan:  1.   Multiple sclerosis  2.  Paraparesis  3.  Neurogenic bowel and bladder  4.  Generalized pain  The patient has had a recent increase in generalized pain which may be related to the decrease in gabapentin dose or potentially related to an exacerbation of her MS with a spinal cord lesion.  The patient last had MRI evaluation in 2018.  We will set her up for MRI of the brain and cervical spine.  The patient will continue the Tecfidera, she will follow-up here in 6 months.  She just recently had blood work done showing a white blood count 10.8, hemoglobin 12.1, MCV of 88.0, platelets of 305, lymphocyte count of 1.7.  I do not have a recent lipid profile.  In the future, she can be followed through Dr. Felecia Shelling.  The Cymbalta will be increased for pain, she will take 60 mg twice daily.  Jill Alexanders MD 02/20/2021 11:40 AM  [facility] [address] [place], [place] [address]  Phone [phone] Fax [phone]

## 2021-02-20 NOTE — Telephone Encounter (Signed)
[employer] medicare/medicaid order sent to [facility]. They will reach out to the patient to schedule.

## 2021-02-22 NOTE — Telephone Encounter (Signed)
Patient called me stating she cannot have her MRI's at GI because she is in a wheel chair.  She is scheduled at [facility] for Friday 03/09/21 to arrive at 10:30 AM. I left a voicemail to inform the patient this information and left their number of [phone] incase she needed to r/s.

## 2021-03-01 ENCOUNTER — Encounter (HOSPITAL_COMMUNITY): Payer: Self-pay | Admitting: Urology

## 2021-03-01 NOTE — Patient Instructions (Addendum)
Preop instructions for: Vaudie Biagioni  Date of Birth: [birth year]                   Date of Procedure:   03/07/2021 Procedure:   CYSTOSCOPY BOTOX INJECTION AND SUPRAPUBIC TUBE EXCHANGE    Surgeon:  Dr. Link Snuffer Facility contact: [facility]    Phone: [place]: RN contact name/phone#:                          and Fax #: [phone]   Transportation contact phone#:     Time to arrive at [facility]: 9:00 AM   Report to: Admitting (On your left hand side)    Do not eat or drink past midnight the night before your procedure.(To include any tube feedings-must be discontinued)   Take these morning medications only with sips of water.(or give through gastrostomy or feeding tube). Baclofen, Duloxetine, Gabapentin, Methimazole, Metoprolol, Pantoprazole, Prednisone, Solifenacin, Tecfidera, Use eyedrops   The Day Before Surgery: Take only half (1/2) of Lantus at bedtime   Note: No Insulin or Diabetic meds should be given or taken the morning of the procedure!   Please send day of procedure:current med list and meds last taken that day, confirm nothing by mouth status from what time, Patient Demographic info( to include DNR status, problem list, allergies)   Bring Insurance card and picture ID Leave all jewelry and other valuables at place where living( no metal or rings to be worn) No contact lens Women-no make-up, no lotions,perfumes,powders   Any questions day of procedure,call  SHORT STAY-[phone]     Sent from :[facility]                   Phone:[phone]                   Fax:[phone]   Sent by :   Harlon Flor BSN, RN

## 2021-03-01 NOTE — Progress Notes (Signed)
Short Stay: COVID testing date: N/A  Anesthesia:  PCP - [name], MD Cardiologist - N/A   Chest x-ray - greater than 1 year in epic EKG - 09/13/20 in epic Stress Test -  N/A ECHO -  N/A Cardiac Cath -  N/A Pacemaker/ICD device last checked: N/A   Sleep Study -  N/A CPAP -  N/A   Fasting Blood Sugar -  Checks Blood Sugar _____ times a day   Blood Thinner Instructions: N/A Aspirin Instructions: N/A Last Dose: N/A   Activity level:   paraplegic                                                    Anesthesia review: Multiple sclerosis with extensive brain and spinal cord involvement with a paraplegia,Poor R wave progression on EKG 09/13/20 in epic, HTN, DM   Patient denies shortness of breath, fever, cough and chest pain at PAT appointment

## 2021-03-02 NOTE — Progress Notes (Signed)
Preop instructions for:     Laura Roberts Date of Birth:       [birth year]                Date of Procedure:   03-07-21 Procedure:     Cysto Botox Suprapubic exhange Surgeon:  Dr. Link Snuffer Facility contact:  [facility]   Phone:          [place]: RN contact name/phone#:                          and Fax #: [phone]   Transportation contact phone#: Corey Harold [phone]    Time to arrive at [facility]: 9:00 AM   Report to: Admitting (On your left hand side)    Do not eat or drink past midnight the night before your procedure.(To include any tube feedings-must be discontinued)   Take these morning medications only with sips of water.(or give through gastrostomy or feeding tube).  Acetaminophen, Duloxetine, Gabapentin, Methimazole, Metoprolol, Pantoprazole, Prednisone, Solfenacin, Tecfidera.  Ondansetron if needed   The Day Before Surgery:  Administer 50% of Insulin Glargine (Lantus) at bedtime   Note: No Insulin or Diabetic meds should be given or taken the morning of the procedure!     Please send day of procedure:current med list and meds last taken that day, confirm nothing by mouth status from what time, Patient Demographic info( to include DNR status, problem list, allergies)   Bring Insurance card and picture ID Leave all jewelry and other valuables at place where living( no metal or rings to be worn) No contact lens Women-no make-up, no lotions,perfumes,powders Men-no colognes,lotions   Any questions day of procedure,call  SHORT STAY-[phone]     Sent from :[facility]                   Phone:[phone]                   Fax:[phone]   Sent by :     Norvel Richards,  RN

## 2021-03-06 ENCOUNTER — Emergency Department (HOSPITAL_COMMUNITY)
Admission: EM | Admit: 2021-03-06 | Discharge: 2021-03-06 | Disposition: A | Discharge status: Home / Self Care | Payer: Medicare Other | Attending: Emergency Medicine | Admitting: Emergency Medicine

## 2021-03-06 ENCOUNTER — Other Ambulatory Visit: Payer: Self-pay

## 2021-03-06 ENCOUNTER — Emergency Department (HOSPITAL_COMMUNITY): Payer: Medicare Other

## 2021-03-06 DIAGNOSIS — R4182 Altered mental status, unspecified: Secondary | ICD-10-CM | POA: Insufficient documentation

## 2021-03-06 DIAGNOSIS — I10 Essential (primary) hypertension: Secondary | ICD-10-CM | POA: Diagnosis not present

## 2021-03-06 DIAGNOSIS — Z96653 Presence of artificial knee joint, bilateral: Secondary | ICD-10-CM | POA: Insufficient documentation

## 2021-03-06 DIAGNOSIS — E876 Hypokalemia: Secondary | ICD-10-CM | POA: Diagnosis not present

## 2021-03-06 DIAGNOSIS — Z20822 Contact with and (suspected) exposure to covid-19: Secondary | ICD-10-CM | POA: Insufficient documentation

## 2021-03-06 DIAGNOSIS — Z79899 Other long term (current) drug therapy: Secondary | ICD-10-CM | POA: Insufficient documentation

## 2021-03-06 DIAGNOSIS — E119 Type 2 diabetes mellitus without complications: Secondary | ICD-10-CM | POA: Insufficient documentation

## 2021-03-06 DIAGNOSIS — R509 Fever, unspecified: Secondary | ICD-10-CM | POA: Diagnosis not present

## 2021-03-06 DIAGNOSIS — Z794 Long term (current) use of insulin: Secondary | ICD-10-CM | POA: Diagnosis not present

## 2021-03-06 LAB — CBC WITH DIFFERENTIAL/PLATELET
Abs Immature Granulocytes: 0.13 10*3/uL — ABNORMAL HIGH (ref 0.00–0.07)
Basophils Absolute: 0 10*3/uL (ref 0.0–0.1)
Basophils Relative: 0 %
Eosinophils Absolute: 0.1 10*3/uL (ref 0.0–0.5)
Eosinophils Relative: 1 %
HCT: 30.6 % — ABNORMAL LOW (ref 36.0–46.0)
Hemoglobin: 10 g/dL — ABNORMAL LOW (ref 12.0–15.0)
Immature Granulocytes: 1 %
Lymphocytes Relative: 7 %
Lymphs Abs: 1.1 10*3/uL (ref 0.7–4.0)
MCH: 28.7 pg (ref 26.0–34.0)
MCHC: 32.7 g/dL (ref 30.0–36.0)
MCV: 87.7 fL (ref 80.0–100.0)
Monocytes Absolute: 1.2 10*3/uL — ABNORMAL HIGH (ref 0.1–1.0)
Monocytes Relative: 7 %
Neutro Abs: 14.5 10*3/uL — ABNORMAL HIGH (ref 1.7–7.7)
Neutrophils Relative %: 84 %
Platelets: 295 10*3/uL (ref 150–400)
RBC: 3.49 MIL/uL — ABNORMAL LOW (ref 3.87–5.11)
RDW: 15.3 % (ref 11.5–15.5)
WBC: 17.1 10*3/uL — ABNORMAL HIGH (ref 4.0–10.5)
nRBC: 0 % (ref 0.0–0.2)

## 2021-03-06 LAB — BASIC METABOLIC PANEL
Anion gap: 5 (ref 5–15)
BUN: 8 mg/dL (ref 8–23)
CO2: 31 mmol/L (ref 22–32)
Calcium: 7.3 mg/dL — ABNORMAL LOW (ref 8.9–10.3)
Chloride: 100 mmol/L (ref 98–111)
Creatinine, Ser: 0.45 mg/dL (ref 0.44–1.00)
GFR, Estimated: >60 mL/min (ref >60)
Glucose, Bld: 116 mg/dL — ABNORMAL HIGH (ref 70–99)
Potassium: 3.3 mmol/L — ABNORMAL LOW (ref 3.5–5.1)
Sodium: 136 mmol/L (ref 135–145)

## 2021-03-06 LAB — RESP PANEL BY RT-PCR (FLU A&B, COVID) ARPGX2
Influenza A by PCR: NEGATIVE
Influenza B by PCR: NEGATIVE
SARS Coronavirus 2 by RT PCR: NEGATIVE

## 2021-03-06 LAB — LACTIC ACID, PLASMA: Lactic Acid, Venous: 1.3 mmol/L (ref 0.5–1.9)

## 2021-03-06 MED ORDER — SODIUM CHLORIDE 0.9 % IV SOLN: Freq: Once | INTRAVENOUS | Status: AC

## 2021-03-06 MED ORDER — ACETAMINOPHEN 500 MG PO TABS
1000.0000 mg | ORAL_TABLET | Freq: Once | ORAL | Status: AC
  Administered 2021-03-06: 1000 mg via ORAL
  Filled 2021-03-06: qty 2

## 2021-03-06 MED ORDER — POTASSIUM CHLORIDE 20 MEQ PO PACK
40.0000 meq | PACK | Freq: Once | ORAL | Status: AC
  Administered 2021-03-06: 40 meq via ORAL
  Filled 2021-03-06: qty 2

## 2021-03-06 MED ORDER — SODIUM CHLORIDE 0.9 % IV SOLN
1.0000 g | INTRAVENOUS | Status: AC
  Administered 2021-03-07: 1 g via INTRAVENOUS
  Filled 2021-03-06: qty 1

## 2021-03-06 NOTE — Discharge Instructions (Addendum)
The cause of your fever is unclear but we were unable to get a urine specimen.  When you see Dr. Gloriann Loan later today ask him to check your urinalysis.

## 2021-03-06 NOTE — ED Notes (Addendum)
Attempted in and out cath with nurse, Merrily Pew and [facility] was unsuccessful .

## 2021-03-06 NOTE — ED Triage Notes (Signed)
Pt BIB EMS from SNF [facility]). Per report pt was found lethargic tonight. Per report pt was recently diagnosis with UTI and started on ABT last Saturday. Pt a/ox4.    BP 91/68 RR20 HR 104 O2sat 93% on RA CBG 170

## 2021-03-06 NOTE — ED Provider Notes (Signed)
[facility] DEPT Provider Note: Georgena Spurling, MD, FACEP  CSN: [id number] MRN: [id number] ARRIVAL: 03/06/21 at [place]: [place]  Altered Mental Status   HISTORY OF PRESENT ILLNESS  03/06/21 12:34 AM Laura Roberts is a 80 y.o. female with a history of multiple sclerosis and a suprapubic catheter.  She was diagnosed with a urinary tract infection 3 days ago.  She was placed on Rocephin 1 g IM injections x3 days.  She was sent from her living facility this morning because of reported altered mental status ("lethargy").  On my evaluation the patient is awake, alert, and oriented to person, place, year, month but not day a week.  She tells me she has had chills and feels weaker than usual but denies any pain.  She acknowledges she has a little feeling below the waist because of her MS.  She is able to move her right arm but has minimal movement of the left arm and lower extremities at baseline.  She denies any shortness of breath or abdominal pain.  She is scheduled to have her indwelling suprapubic catheter replaced by Dr. Gloriann Loan of urology at 10 AM today.  She also states he is going to do a Botox injection.   Past Medical History:  Diagnosis Date   Age related osteoporosis    Aphasia    Bronchitis    hx of    Cellulitis and abscess of toe    hx of    Constipation    Cystostomy in place [facility])    Decubitus ulcer    Degenerative arthritis    osteoarthritis    Depression    Diabetes mellitus without complication (HCC)    type 2    Edema    localized    Full incontinence of feces    Gait disturbance    GERD (gastroesophageal reflux disease)    Gout    hx of    History of falling    Hyperlipidemia    Hypertension    Hyperthyroidism    Multiple sclerosis (HCC)    Neurogenic bowel    Neuromuscular disorder (HCC)    Bilateral hand carpal tunnel syndrome   Neuromuscular dysfunction of bladder    Nontoxic multinodular goiter    Obesity    Osteoporosis     Other adrenocortical insufficiency (HCC)    Paraplegia (HCC)    LEGS   Personal history of urinary (tract) infections    Polyneuropathy    Pressure ulcer of right buttock, stage 4 (HCC)    Spinal stenosis in cervical region    Spinal stenosis of lumbosacral region    Spinal stenosis, thoracic    Tinea unguium    Vitamin D deficiency     Past Surgical History:  Procedure Laterality Date   ABDOMINAL HYSTERECTOMY     BACK SURGERY     BOTOX INJECTION N/A 01/30/2018   Procedure: CYSTOSCOPY BOTOX INJECTION, SUPRAPUBIC EXCHANGE;  Surgeon: Lucas Mallow, MD;  Location: [facility];  Service: Urology;  Laterality: N/A;   BOTOX INJECTION N/A 09/02/2018   Procedure: BOTOX INJECTION;  Surgeon: Lucas Mallow, MD;  Location: [facility];  Service: Urology;  Laterality: N/A;   BOTOX INJECTION N/A 05/10/2019   Procedure: CYSTOSCOPY BOTOX INJECTION, SUPRAPUBIC EXCHANGE;  Surgeon: Lucas Mallow, MD;  Location: [facility];  Service: Urology;  Laterality: N/A;   BOTOX INJECTION N/A 09/06/2019   Procedure: CYSTOSCOPY BOTOX INJECTION;  Surgeon: Lucas Mallow, MD;  Location: [facility];  Service: Urology;  Laterality: N/A;   BOTOX INJECTION N/A 04/10/2020   Procedure: CYSTOSCOPY BOTOX INJECTION;  Surgeon: Festus Aloe, MD;  Location: [facility];  Service: Urology;  Laterality: N/A;   BOTOX INJECTION N/A 09/13/2020   Procedure: CYSTOSCOPYBOTOX INJECTION SUPRAPUBIC TUBE UPSIZE AND EXCHANGE;  Surgeon: Lucas Mallow, MD;  Location: [facility];  Service: Urology;  Laterality: N/A;   CATARACT EXTRACTION Right    CHOLECYSTECTOMY     CYSTOSCOPY N/A 09/02/2018   Procedure: CYSTOSCOPY;  Surgeon: Lucas Mallow, MD;  Location: [facility];  Service: Urology;  Laterality: N/A;   CYSTOSTOMY N/A 09/02/2018   Procedure: CYSTOSTOMY SUPRAPUBIC;  Surgeon: Lucas Mallow, MD;  Location: [facility];  Service: Urology;  Laterality: N/A;  45 MINS   FEMUR IM NAIL Right 06/02/2014   Procedure: INTRAMEDULLARY (IM) RETROGRADE FEMORAL  NAILING;  Surgeon: Johnny Bridge, MD;  Location: [place];  Service: Orthopedics;  Laterality: Right;   GROIN DISSECTION Left 10/24/2017   Procedure: IRRIGATION AND DEBRIDEMENT, LEFT THIGH ABCESS ;  Surgeon: Alphonsa Overall, MD;  Location: [facility];  Service: General;  Laterality: Left;   INSERTION OF SUPRAPUBIC CATHETER N/A 09/06/2019   Procedure: SUPRAPUBIC TUBE CHANGE;  Surgeon: Lucas Mallow, MD;  Location: [facility];  Service: Urology;  Laterality: N/A;   INSERTION OF SUPRAPUBIC CATHETER N/A 04/10/2020   Procedure: SUPRAPUBIC CATHETER EXCHANGE/TRACT  DILATION;  Surgeon: Festus Aloe, MD;  Location: [facility];  Service: Urology;  Laterality: N/A;   IR CATHETER TUBE CHANGE  10/13/2017   IR CATHETER TUBE CHANGE  07/15/2018   IR CATHETER TUBE CHANGE  08/26/2019   IR CATHETER TUBE CHANGE  11/25/2019   IR CATHETER TUBE CHANGE  01/06/2020   IR CATHETER TUBE CHANGE  06/13/2020   IR CATHETER TUBE CHANGE  08/01/2020   left hand carpal tunnel surgery     REPLACEMENT TOTAL KNEE BILATERAL  2004    Family History  Problem Relation Age of Onset   Multiple sclerosis Other        neices.    Cancer Mother    Diabetes Mother    GI Bleed Sister        diverticulitis   Thyroid disease Neg Hx     Social History   Tobacco Use   Smoking status: Never   Smokeless tobacco: Never  Vaping Use   Vaping Use: Never used  Substance Use Topics   Alcohol use: No   Drug use: No    Prior to Admission medications   Medication Sig Start Date End Date Taking? Authorizing Provider  acetaminophen (TYLENOL) 325 MG tablet Take 650 mg by mouth every 6 (six) hours as needed for mild pain or fever.     [provider]  ALPRAZolam Duanne Moron) 0.5 MG tablet Take 2 tablets approximately 45 minutes prior to the MRI study, take a third tablet if needed. 02/20/21   Kathrynn Ducking, MD  Amino Acids-Protein Hydrolys (FEEDING SUPPLEMENT, PRO-STAT SUGAR FREE 64,) LIQD Take 30 mLs by mouth once.    [provider]   baclofen (LIORESAL) 10 MG tablet Take 10-15 mg by mouth daily. (0800 & 1200) Take 1.5 tablets (15 mg) by mouth in the morning & take 1 tablet (10 mg) by mouth at noon.    [provider]  baclofen (LIORESAL) 20 MG tablet Take 20 mg by mouth at bedtime. (2000)    [provider]  barrier cream (NON-SPECIFIED) CREA Apply 1  application topically 2 (two) times daily. (0700 & 1900) Apply to right ischium and bilateral buttocks    [provider]  calcium citrate (CALCITRATE - DOSED IN MG ELEMENTAL CALCIUM) 950 (200 Ca) MG tablet Take 200 mg of elemental calcium by mouth daily.    [provider]  Carboxymeth-Glycerin-Polysorb 0.5-1-0.5 % SOLN Apply 1 drop to eye 4 (four) times daily. (0800,1200, 1600 & 2000)    [provider]  cholecalciferol (VITAMIN D) 25 MCG (1000 UNIT) tablet Take 1,000 Units by mouth daily.    [provider]  cycloSPORINE (RESTASIS) 0.05 % ophthalmic emulsion Place 1 drop into both eyes 2 (two) times daily. (0800 & 2000)    [provider]  Dulaglutide (TRULICITY) 3 0000000 SOPN Inject 3 mg into the skin every Friday. (0800)    [provider]  DULoxetine (CYMBALTA) 60 MG capsule Take 1 capsule (60 mg total) by mouth 2 (two) times daily. (0800)Give along with 30 mg to = 90 mg 02/20/21   Kathrynn Ducking, MD  furosemide (LASIX) 40 MG tablet Take 40 mg by mouth daily. (0800)    [provider]  gabapentin (NEURONTIN) 400 MG capsule Take 400 mg by mouth 3 (three) times daily. (0800, 1400, & 2000)    [provider]  guaiFENesin (MUCINEX) 600 MG 12 hr tablet Take 1,200 mg by mouth at bedtime. (2000)    [provider]  insulin glargine (LANTUS) 100 UNIT/ML Solostar Pen Inject 18 Units into the skin at bedtime. (2000)    [provider]  loperamide (IMODIUM) 2 MG capsule Take 2 mg by mouth as needed for diarrhea or loose stools (SUBSEQUENT DOSE GIVE ONE TAB AFTER EACH STOOL (max 8  mg/24 hrs.)).    [provider]  magnesium hydroxide (MILK OF MAGNESIA) 400 MG/5ML suspension Take 30 mLs by mouth daily as needed for mild constipation.    [provider]  methimazole (TAPAZOLE) 5 MG tablet Take 1 tablet (5 mg total) by mouth daily. Patient taking differently: Take 5 mg by mouth daily at 6 (six) AM. (0600) 11/11/17   Renato Shin, MD  metoprolol succinate (TOPROL-XL) 50 MG 24 hr tablet Take 50 mg by mouth daily. (0800)    [provider]  Multiple Vitamin (MULTIVITAMIN WITH MINERALS) TABS tablet Take 1 tablet by mouth daily. (0800)    [provider]  ondansetron (ZOFRAN) 4 MG tablet Take 4 mg by mouth every 6 (six) hours as needed for nausea or vomiting.    [provider]  pantoprazole (PROTONIX) 40 MG tablet Take 40 mg by mouth daily at 6 (six) AM. (0600)    [provider]  potassium chloride (KLOR-CON) 10 MEQ tablet Take 10 mEq by mouth every morning. (1000) 03/23/20   [provider]  predniSONE (DELTASONE) 2.5 MG tablet Take 2.5 mg by mouth every other day. (0800) Alternating days with 5 mg dose    [provider]  predniSONE (DELTASONE) 5 MG tablet Take 5 mg by mouth every other day. (0800) Alternating days with 2.5 mg dose    [provider]  SF 5000 PLUS 1.1 % CREA dental cream Take by mouth. 12/13/20   [provider]  Sodium Phosphates (RA ENEMA) 7-19 GM/118ML ENEM Place 118 mLs rectally daily as needed (severe constipation).    [provider]  solifenacin (VESICARE) 10 MG tablet Take 10 mg by mouth daily. (0800)    [provider]  TECFIDERA 240 MG CPDR Take 1  capsule (240 mg total) by mouth 2 (two) times daily. (0800 & 2000) 01/25/21   Kathrynn Ducking, MD  vitamin C (ASCORBIC ACID) 500 MG tablet Take 500 mg by mouth daily.    [provider]  zinc sulfate 220 (50 Zn) MG capsule Take 220 mg by mouth daily.    [provider]     Allergies Codeine, Ultram [tramadol], and Januvia [sitagliptin]   REVIEW OF SYSTEMS  Negative except as noted here or in the History of Present Illness.   PHYSICAL EXAMINATION  Initial Vital Signs Blood pressure 117/70, pulse 100, temperature 98.6 F (37 C), temperature source Oral, resp. rate 18, SpO2 99 %.  Examination General: Well-developed, well-nourished female in no acute distress; appearance consistent with age of record HENT: normocephalic; atraumatic Eyes: pupils equal, round and reactive to light; extraocular muscles intact; bilateral arcus senilis and pseudophakia Neck: supple Heart: regular rate and rhythm Lungs: clear to auscultation bilaterally Abdomen: soft; nondistended; nontender; right lower quadrant indwelling urinary catheter draining blood-tinged urine; bowel sounds present Extremities: No acute deformity; chronic appearing contractures of ankles and feet, which are in soft orthotics Neurologic: Awake, alert and oriented as noted above; paraparesis and left hemiparesis  Skin: Warm and dry Psychiatric: Normal mood and affect   RESULTS  Summary of this visit's results, reviewed and interpreted by myself:   EKG Interpretation  Date/Time:    Ventricular Rate:    PR Interval:    QRS Duration:   QT Interval:    QTC Calculation:   R Axis:     Text Interpretation:         Laboratory Studies: Results for orders placed or performed during the hospital encounter of 03/06/21 (from the past 24 hour(s))  CBC with Differential/Platelet     Status: Abnormal   Collection Time: 03/06/21 12:52 AM  Result Value Ref Range   WBC 17.1 (H) 4.0 - 10.5 K/uL   RBC 3.49 (L) 3.87 - 5.11 MIL/uL   Hemoglobin 10.0 (L) 12.0 - 15.0 g/dL   HCT 30.6 (L) 36.0 - 46.0 %   MCV 87.7 80.0 - 100.0 fL   MCH 28.7 26.0 - 34.0 pg   MCHC 32.7 30.0 - 36.0 g/dL   RDW 15.3 11.5 - 15.5 %   Platelets 295 150 - 400 K/uL   nRBC 0.0 0.0 - 0.2 %   Neutrophils Relative % 84 %   Neutro  Abs 14.5 (H) 1.7 - 7.7 K/uL   Lymphocytes Relative 7 %   Lymphs Abs 1.1 0.7 - 4.0 K/uL   Monocytes Relative 7 %   Monocytes Absolute 1.2 (H) 0.1 - 1.0 K/uL   Eosinophils Relative 1 %   Eosinophils Absolute 0.1 0.0 - 0.5 K/uL   Basophils Relative 0 %   Basophils Absolute 0.0 0.0 - 0.1 K/uL   Immature Granulocytes 1 %   Abs Immature Granulocytes 0.13 (H) 0.00 - 0.07 K/uL  Basic metabolic panel     Status: Abnormal   Collection Time: 03/06/21 12:52 AM  Result Value Ref Range   Sodium 136 135 - 145 mmol/L   Potassium 3.3 (L) 3.5 - 5.1 mmol/L   Chloride 100 98 - 111 mmol/L   CO2 31 22 - 32 mmol/L   Glucose, Bld 116 (H) 70 - 99 mg/dL   BUN 8 8 - 23 mg/dL   Creatinine, Ser 0.45 0.44 - 1.00 mg/dL   Calcium 7.3 (L) 8.9 - 10.3 mg/dL   GFR, Estimated >60 >60 mL/min  Anion gap 5 5 - 15  Lactic acid, plasma     Status: None   Collection Time: 03/06/21  1:27 AM  Result Value Ref Range   Lactic Acid, Venous 1.3 0.5 - 1.9 mmol/L  Resp Panel by RT-PCR (Flu A&B, Covid) Nasopharyngeal Swab     Status: None   Collection Time: 03/06/21  1:58 AM   Specimen: Nasopharyngeal Swab; Nasopharyngeal(NP) swabs in vial transport medium  Result Value Ref Range   SARS Coronavirus 2 by RT PCR NEGATIVE NEGATIVE   Influenza A by PCR NEGATIVE NEGATIVE   Influenza B by PCR NEGATIVE NEGATIVE   Imaging Studies: DG Chest Port 1 View  Result Date: 03/06/2021 CLINICAL DATA:  Lethargy, altered mental status EXAM: PORTABLE CHEST 1 VIEW COMPARISON:  10/09/2017 FINDINGS: Lungs are clear. No pneumothorax or pleural effusion. Cardiac size within normal limits. Tortuosity of the thoracic aorta is stable when accounting for semi-erect positioning. No acute bone abnormality. IMPRESSION: No active disease. Electronically Signed   By: Fidela Salisbury M.D.   On: 03/06/2021 02:20    ED COURSE and MDM  Nursing notes, initial and subsequent vitals signs, including pulse oximetry, reviewed and interpreted by myself.  Vitals:    03/06/21 0145 03/06/21 0146 03/06/21 0245 03/06/21 0345  BP: 122/85  (!) 164/98 140/81  Pulse: (!) 104  (!) 115 (!) 109  Resp: '20  18 19  '$ Temp:  100.3 F (37.9 C)    TempSrc:  Rectal    SpO2: 100%  99% 95%  Weight:      Height:       Medications  acetaminophen (TYLENOL) tablet 1,000 mg (has no administration in time range)  0.9 %  sodium chloride infusion ( Intravenous New Bag/Given 03/06/21 0058)  potassium chloride (KLOR-CON) packet 40 mEq (40 mEq Oral Given 03/06/21 0220)   4:11 AM Unable to obtain urine specimen for urinalysis but since patient was recently treated with IM Rocephin x3 days I think the likelihood of urinary tract infection is low.  Her chest x-ray is reassuring and her COVID and influenza tests are negative.  I spoke with her facility physician and she agrees the patient may return to the facility and follow-up with Dr. Gloriann Loan later today.  He will be able to obtain urine during the procedure and send it for urinalysis.   PROCEDURES  Procedures   ED DIAGNOSES     ICD-10-CM   1. Febrile illness  R50.9     2. COVID-19 virus not detected  Z20.822     3. Hypokalemia  E87.6     4. Hypocalcemia  E83.51          [place] Deleo, Jenny Reichmann, MD 03/06/21 [phone]

## 2021-03-07 ENCOUNTER — Ambulatory Visit (HOSPITAL_COMMUNITY): Payer: Medicare Other | Admitting: Physician Assistant

## 2021-03-07 ENCOUNTER — Encounter (HOSPITAL_COMMUNITY): Payer: Self-pay | Admitting: Urology

## 2021-03-07 ENCOUNTER — Other Ambulatory Visit: Payer: Self-pay

## 2021-03-07 ENCOUNTER — Ambulatory Visit (HOSPITAL_COMMUNITY)
Admission: RE | Admit: 2021-03-07 | Discharge: 2021-03-07 | Disposition: A | Discharge status: Rehabilitation Facility | Payer: Medicare Other | Attending: Urology | Admitting: Urology

## 2021-03-07 ENCOUNTER — Encounter (HOSPITAL_COMMUNITY)
Admission: RE | Disposition: A | Discharge status: Rehabilitation Facility | Payer: Self-pay | Source: Home / Self Care | Attending: Urology

## 2021-03-07 DIAGNOSIS — Z888 Allergy status to other drugs, medicaments and biological substances status: Secondary | ICD-10-CM | POA: Insufficient documentation

## 2021-03-07 DIAGNOSIS — Z79899 Other long term (current) drug therapy: Secondary | ICD-10-CM | POA: Insufficient documentation

## 2021-03-07 DIAGNOSIS — Z96653 Presence of artificial knee joint, bilateral: Secondary | ICD-10-CM | POA: Insufficient documentation

## 2021-03-07 DIAGNOSIS — Z7952 Long term (current) use of systemic steroids: Secondary | ICD-10-CM | POA: Diagnosis not present

## 2021-03-07 DIAGNOSIS — Z794 Long term (current) use of insulin: Secondary | ICD-10-CM | POA: Diagnosis not present

## 2021-03-07 DIAGNOSIS — N319 Neuromuscular dysfunction of bladder, unspecified: Secondary | ICD-10-CM | POA: Diagnosis not present

## 2021-03-07 DIAGNOSIS — Z885 Allergy status to narcotic agent status: Secondary | ICD-10-CM | POA: Diagnosis not present

## 2021-03-07 HISTORY — DX: Pressure ulcer of unspecified site, unspecified stage: L89.90

## 2021-03-07 HISTORY — PX: BOTOX INJECTION: SHX5754

## 2021-03-07 LAB — BASIC METABOLIC PANEL
Anion gap: 14 (ref 5–15)
BUN: <5 mg/dL — ABNORMAL LOW (ref 8–23)
CO2: 28 mmol/L (ref 22–32)
Calcium: 8.9 mg/dL (ref 8.9–10.3)
Chloride: 96 mmol/L — ABNORMAL LOW (ref 98–111)
Creatinine, Ser: 0.47 mg/dL (ref 0.44–1.00)
GFR, Estimated: >60 mL/min (ref >60)
Glucose, Bld: 194 mg/dL — ABNORMAL HIGH (ref 70–99)
Potassium: 2.8 mmol/L — ABNORMAL LOW (ref 3.5–5.1)
Sodium: 138 mmol/L (ref 135–145)

## 2021-03-07 LAB — CBC
HCT: 31.9 % — ABNORMAL LOW (ref 36.0–46.0)
Hemoglobin: 10.6 g/dL — ABNORMAL LOW (ref 12.0–15.0)
MCH: 28.6 pg (ref 26.0–34.0)
MCHC: 33.2 g/dL (ref 30.0–36.0)
MCV: 86 fL (ref 80.0–100.0)
Platelets: 386 10*3/uL (ref 150–400)
RBC: 3.71 MIL/uL — ABNORMAL LOW (ref 3.87–5.11)
RDW: 15.3 % (ref 11.5–15.5)
WBC: 20.8 10*3/uL — ABNORMAL HIGH (ref 4.0–10.5)
nRBC: 0 % (ref 0.0–0.2)

## 2021-03-07 LAB — HEMOGLOBIN A1C
Hgb A1c MFr Bld: 5.5 % (ref 4.8–5.6)
Mean Plasma Glucose: 111.15 mg/dL

## 2021-03-07 LAB — GLUCOSE, CAPILLARY
Glucose-Capillary: 187 mg/dL — ABNORMAL HIGH (ref 70–99)
Glucose-Capillary: 198 mg/dL — ABNORMAL HIGH (ref 70–99)

## 2021-03-07 SURGERY — BOTOX INJECTION
Anesthesia: General

## 2021-03-07 MED ORDER — PROPOFOL 10 MG/ML IV BOLUS
INTRAVENOUS | Status: AC
  Filled 2021-03-07: qty 20

## 2021-03-07 MED ORDER — ONDANSETRON HCL 4 MG/2ML IJ SOLN
INTRAMUSCULAR | Status: AC
  Filled 2021-03-07: qty 2

## 2021-03-07 MED ORDER — SODIUM CHLORIDE 0.9 % IR SOLN
Status: DC | PRN
  Administered 2021-03-07: 3000 mL via INTRAVESICAL

## 2021-03-07 MED ORDER — ORAL CARE MOUTH RINSE: 15.0000 mL | Freq: Once | OROMUCOSAL | Status: AC

## 2021-03-07 MED ORDER — ONDANSETRON HCL 4 MG/2ML IJ SOLN
INTRAMUSCULAR | Status: DC | PRN
  Administered 2021-03-07: 4 mg via INTRAVENOUS

## 2021-03-07 MED ORDER — ONDANSETRON HCL 4 MG/2ML IJ SOLN: 4.0000 mg | Freq: Once | INTRAMUSCULAR | Status: DC | PRN

## 2021-03-07 MED ORDER — LACTATED RINGERS IV SOLN: INTRAVENOUS | Status: DC

## 2021-03-07 MED ORDER — PHENYLEPHRINE 40 MCG/ML (10ML) SYRINGE FOR IV PUSH (FOR BLOOD PRESSURE SUPPORT)
PREFILLED_SYRINGE | INTRAVENOUS | Status: AC
  Filled 2021-03-07: qty 10

## 2021-03-07 MED ORDER — LIDOCAINE 2% (20 MG/ML) 5 ML SYRINGE
INTRAMUSCULAR | Status: DC | PRN
  Administered 2021-03-07: 60 mg via INTRAVENOUS

## 2021-03-07 MED ORDER — PROPOFOL 10 MG/ML IV BOLUS
INTRAVENOUS | Status: DC | PRN
  Administered 2021-03-07: 120 mg via INTRAVENOUS

## 2021-03-07 MED ORDER — PHENYLEPHRINE 40 MCG/ML (10ML) SYRINGE FOR IV PUSH (FOR BLOOD PRESSURE SUPPORT)
PREFILLED_SYRINGE | INTRAVENOUS | Status: DC | PRN
  Administered 2021-03-07 (×2): 80 ug via INTRAVENOUS

## 2021-03-07 MED ORDER — DEXAMETHASONE SODIUM PHOSPHATE 10 MG/ML IJ SOLN
INTRAMUSCULAR | Status: DC | PRN
  Administered 2021-03-07: 10 mg via INTRAVENOUS

## 2021-03-07 MED ORDER — SODIUM CHLORIDE (PF) 0.9 % IJ SOLN
INTRAMUSCULAR | Status: AC
  Filled 2021-03-07: qty 20

## 2021-03-07 MED ORDER — CHLORHEXIDINE GLUCONATE 0.12 % MT SOLN
15.0000 mL | Freq: Once | OROMUCOSAL | Status: AC
  Administered 2021-03-07: 15 mL via OROMUCOSAL

## 2021-03-07 MED ORDER — ONABOTULINUMTOXINA 100 UNITS IJ SOLR
INTRAMUSCULAR | Status: DC | PRN
  Administered 2021-03-07: 200 [IU] via INTRAMUSCULAR

## 2021-03-07 MED ORDER — FENTANYL CITRATE PF 50 MCG/ML IJ SOSY: 25.0000 ug | PREFILLED_SYRINGE | INTRAMUSCULAR | Status: DC | PRN

## 2021-03-07 MED ORDER — ONABOTULINUMTOXINA 100 UNITS IJ SOLR
INTRAMUSCULAR | Status: AC
  Filled 2021-03-07: qty 200

## 2021-03-07 MED ORDER — FENTANYL CITRATE (PF) 100 MCG/2ML IJ SOLN
INTRAMUSCULAR | Status: DC | PRN
  Administered 2021-03-07: 25 ug via INTRAVENOUS
  Administered 2021-03-07: 50 ug via INTRAVENOUS
  Administered 2021-03-07: 25 ug via INTRAVENOUS

## 2021-03-07 MED ORDER — DEXAMETHASONE SODIUM PHOSPHATE 10 MG/ML IJ SOLN
INTRAMUSCULAR | Status: AC
  Filled 2021-03-07: qty 1

## 2021-03-07 MED ORDER — FENTANYL CITRATE (PF) 100 MCG/2ML IJ SOLN
INTRAMUSCULAR | Status: AC
  Filled 2021-03-07: qty 2

## 2021-03-07 MED ORDER — LIDOCAINE 2% (20 MG/ML) 5 ML SYRINGE
INTRAMUSCULAR | Status: AC
  Filled 2021-03-07: qty 5

## 2021-03-07 SURGICAL SUPPLY — 18 items
BAG DRN RND TRDRP ANRFLXCHMBR (UROLOGICAL SUPPLIES) ×1
BAG URINE DRAIN 2000ML AR STRL (UROLOGICAL SUPPLIES) ×1 IMPLANT
BAG URO CATCHER STRL LF (MISCELLANEOUS) ×2 IMPLANT
CATH FOLEY 2WAY SLVR  5CC 18FR (CATHETERS) ×2
CATH FOLEY 2WAY SLVR 5CC 18FR (CATHETERS) IMPLANT
CLOTH BEACON ORANGE TIMEOUT ST (SAFETY) ×2 IMPLANT
GLOVE SURG ENC MOIS LTX SZ7.5 (GLOVE) ×5 IMPLANT
GOWN STRL REUS W/TWL XL LVL3 (GOWN DISPOSABLE) ×4 IMPLANT
KIT TURNOVER KIT A (KITS) ×2 IMPLANT
MANIFOLD NEPTUNE II (INSTRUMENTS) ×2 IMPLANT
NDL ASPIRATION 22 (NEEDLE) ×1 IMPLANT
NDL SAFETY ECLIPSE 18X1.5 (NEEDLE) IMPLANT
NEEDLE ASPIRATION 22 (NEEDLE) ×2 IMPLANT
NEEDLE HYPO 18GX1.5 SHARP (NEEDLE) ×2
PACK CYSTO (CUSTOM PROCEDURE TRAY) ×2 IMPLANT
SYR CONTROL 10ML LL (SYRINGE) ×2 IMPLANT
TUBING CONNECTING 10 (TUBING) ×1 IMPLANT
WATER STERILE IRR 3000ML UROMA (IV SOLUTION) ×2 IMPLANT

## 2021-03-07 NOTE — Op Note (Signed)
Operative Note  Preoperative diagnosis:  1.  Neurogenic bladder  Postoperative diagnosis: 1.  Neurogenic bladder  Procedure(s): 1.  Cystoscopy with intra detrusor Botox injection, 200 units 2.  Complicated suprapubic tube exchange  Surgeon: Link Snuffer, MD  Assistants: None  Anesthesia: General  Complications: None immediate  EBL: Minimal  Specimens: 1.  None  Drains/Catheters: 1.  18 French suprapubic catheter  Intraoperative findings: Wide open patulous urethra.  Bladder mucosa with some bullous edema but no obvious tumors.  200 units of Botox injected.  Indication: 80 year old female with neurogenic bladder managed with Botox and suprapubic tube presents for repeat Botox.  Description of procedure:  The patient was identified and consent was obtained.  The patient was taken to the operating room and placed in the supine position.  The patient was placed under general anesthesia.  Perioperative antibiotics were administered.  The patient was placed in dorsal lithotomy.  Patient was prepped and draped in a standard sterile fashion and a timeout was performed.  A 22 French cystoscope was inserted into the bladder.  Complete cystoscopy was performed.  200 units of Botox was systematically injected throughout the bladder into the detrusor muscle.  I then withdrew the scope.  I attempted to place an 37 French suprapubic catheter but there was some resistance.  I therefore used some female sounds to sequentially dilate from 25 [place] up to 22 [place].  I was then able to easily passed the 18 [place] superpubic catheter into the bladder and instilled 10 cc into the catheter balloon.  Cystoscopy confirmed appropriate placement of the catheter.  This concluded the operation.  Patient tolerated the procedure well was stable postoperative.  Plan: Repeat Botox in about 6 months.

## 2021-03-07 NOTE — Anesthesia Procedure Notes (Signed)
Procedure Name: LMA Insertion Date/Time: 03/07/2021 12:14 PM Performed by: Diarra Kos D, CRNA Pre-anesthesia Checklist: Patient identified, Emergency Drugs available, Suction available and Patient being monitored Patient Re-evaluated:Patient Re-evaluated prior to induction Oxygen Delivery Method: Circle system utilized Preoxygenation: Pre-oxygenation with 100% oxygen Induction Type: IV induction Ventilation: Mask ventilation without difficulty LMA: LMA inserted LMA Size: 4.0 Tube type: Oral Number of attempts: 1 Placement Confirmation: positive ETCO2 and breath sounds checked- equal and bilateral Tube secured with: Tape Dental Injury: Teeth and Oropharynx as per pre-operative assessment

## 2021-03-07 NOTE — Transfer of Care (Signed)
Immediate Anesthesia Transfer of Care Note  Patient: Laura Roberts  Procedure(s) Performed: CYSTOSCOPY BOTOX INJECTION AND SUPRAPUBIC TUBE EXCHANGE  Patient Location: PACU  Anesthesia Type:General  Level of Consciousness: awake, alert  and oriented  Airway & Oxygen Therapy: Patient Spontanous Breathing and Patient connected to face mask oxygen  Post-op Assessment: Report given to RN and Post -op Vital signs reviewed and stable  Post vital signs: Reviewed and stable  Last Vitals:  Vitals Value Taken Time  BP 130/65 03/07/21 1302  Temp    Pulse 117 03/07/21 1303  Resp 20 03/07/21 1303  SpO2 100 % 03/07/21 1303  Vitals shown include unvalidated device data.  Last Pain:  Vitals:   03/07/21 1044  TempSrc:   PainSc: 0-No pain         Complications: No notable events documented.

## 2021-03-07 NOTE — Progress Notes (Signed)
EMS arrived report given to [facility] ambulance transport team she took paperwork packet and patient left facility via stretcher.

## 2021-03-07 NOTE — H&P (Signed)
H&P  Chief Complaint: Neurogenic bladder  History of Present Illness: 80 year old female with history of neurogenic bladder with detrusor overactivity who is managed with intra detrusor Botox and suprapubic tube last performed on 09/14/2018 to presents for repeat intra detrusor Botox with suprapubic tube exchange.  Past Medical History:  Diagnosis Date   Age related osteoporosis    Aphasia    Bronchitis    hx of    Cellulitis and abscess of toe    hx of    Constipation    Cystostomy in place [facility])    Decubitus ulcer    Degenerative arthritis    osteoarthritis    Depression    Diabetes mellitus without complication (HCC)    type 2    Edema    localized    Full incontinence of feces    Gait disturbance    GERD (gastroesophageal reflux disease)    Gout    hx of    History of falling    Hyperlipidemia    Hypertension    Hyperthyroidism    Multiple sclerosis (HCC)    Neurogenic bowel    Neuromuscular disorder (HCC)    Bilateral hand carpal tunnel syndrome   Neuromuscular dysfunction of bladder    Nontoxic multinodular goiter    Obesity    Osteoporosis    Other adrenocortical insufficiency (HCC)    Paraplegia (HCC)    LEGS   Personal history of urinary (tract) infections    Polyneuropathy    Pressure ulcer of right buttock, stage 4 (HCC)    Spinal stenosis in cervical region    Spinal stenosis of lumbosacral region    Spinal stenosis, thoracic    Tinea unguium    Vitamin D deficiency    Past Surgical History:  Procedure Laterality Date   ABDOMINAL HYSTERECTOMY     BACK SURGERY     BOTOX INJECTION N/A 01/30/2018   Procedure: CYSTOSCOPY BOTOX INJECTION, SUPRAPUBIC EXCHANGE;  Surgeon: Lucas Mallow, MD;  Location: [facility];  Service: Urology;  Laterality: N/A;   BOTOX INJECTION N/A 09/02/2018   Procedure: BOTOX INJECTION;  Surgeon: Lucas Mallow, MD;  Location: [facility];  Service: Urology;  Laterality: N/A;   BOTOX INJECTION N/A 05/10/2019   Procedure: CYSTOSCOPY  BOTOX INJECTION, SUPRAPUBIC EXCHANGE;  Surgeon: Lucas Mallow, MD;  Location: [facility];  Service: Urology;  Laterality: N/A;   BOTOX INJECTION N/A 09/06/2019   Procedure: CYSTOSCOPY BOTOX INJECTION;  Surgeon: Lucas Mallow, MD;  Location: [facility];  Service: Urology;  Laterality: N/A;   BOTOX INJECTION N/A 04/10/2020   Procedure: CYSTOSCOPY BOTOX INJECTION;  Surgeon: Festus Aloe, MD;  Location: [facility];  Service: Urology;  Laterality: N/A;   BOTOX INJECTION N/A 09/13/2020   Procedure: CYSTOSCOPYBOTOX INJECTION SUPRAPUBIC TUBE UPSIZE AND EXCHANGE;  Surgeon: Lucas Mallow, MD;  Location: [facility];  Service: Urology;  Laterality: N/A;   CATARACT EXTRACTION Right    CHOLECYSTECTOMY     CYSTOSCOPY N/A 09/02/2018   Procedure: CYSTOSCOPY;  Surgeon: Lucas Mallow, MD;  Location: [facility];  Service: Urology;  Laterality: N/A;   CYSTOSTOMY N/A 09/02/2018   Procedure: CYSTOSTOMY SUPRAPUBIC;  Surgeon: Lucas Mallow, MD;  Location: [facility];  Service: Urology;  Laterality: N/A;  45 MINS   FEMUR IM NAIL Right 06/02/2014   Procedure: INTRAMEDULLARY (IM) RETROGRADE FEMORAL NAILING;  Surgeon: Johnny Bridge, MD;  Location: [facility];  Service: Orthopedics;  Laterality: Right;   GROIN  DISSECTION Left 10/24/2017   Procedure: IRRIGATION AND DEBRIDEMENT, LEFT THIGH ABCESS ;  Surgeon: Alphonsa Overall, MD;  Location: [facility];  Service: General;  Laterality: Left;   INSERTION OF SUPRAPUBIC CATHETER N/A 09/06/2019   Procedure: SUPRAPUBIC TUBE CHANGE;  Surgeon: Lucas Mallow, MD;  Location: [facility];  Service: Urology;  Laterality: N/A;   INSERTION OF SUPRAPUBIC CATHETER N/A 04/10/2020   Procedure: SUPRAPUBIC CATHETER EXCHANGE/TRACT  DILATION;  Surgeon: Festus Aloe, MD;  Location: [facility];  Service: Urology;  Laterality: N/A;   IR CATHETER TUBE CHANGE  10/13/2017   IR CATHETER TUBE CHANGE  07/15/2018   IR CATHETER TUBE CHANGE  08/26/2019   IR CATHETER TUBE CHANGE  11/25/2019   IR CATHETER TUBE CHANGE  01/06/2020    IR CATHETER TUBE CHANGE  06/13/2020   IR CATHETER TUBE CHANGE  08/01/2020   left hand carpal tunnel surgery     REPLACEMENT TOTAL KNEE BILATERAL  2004    Home Medications:  Medications Prior to Admission  Medication Sig Dispense Refill Last Dose   acetaminophen (TYLENOL) 325 MG tablet Take 650 mg by mouth every 6 (six) hours as needed for mild pain or fever.    Past Week   Amino Acids-Protein Hydrolys (FEEDING SUPPLEMENT, PRO-STAT SUGAR FREE 64,) LIQD Take 30 mLs by mouth daily.   Past Week   baclofen (LIORESAL) 10 MG tablet Take 10-15 mg by mouth daily. (0800 & 1200) Take 1.5 tablets (15 mg) by mouth in the morning & take 1 tablet (10 mg) by mouth at noon.   03/06/2021 at 1200   baclofen (LIORESAL) 20 MG tablet Take 20 mg by mouth at bedtime. (2000)   03/06/2021 at 2000   barrier cream (NON-SPECIFIED) CREA Apply 1 application topically 2 (two) times daily. (0700 & 1900) Apply to right ischium and bilateral buttocks   03/06/2021   bisacodyl (DULCOLAX) 10 MG suppository Place 10 mg rectally daily as needed for moderate constipation.   Past Week   calcium citrate (CALCITRATE - DOSED IN MG ELEMENTAL CALCIUM) 950 (200 Ca) MG tablet Take 200 mg of elemental calcium by mouth daily.   03/06/2021   carboxymethylcellul-glycerin (OPTIVE) 0.5-0.9 % ophthalmic solution Place 1 drop into both eyes in the morning, at noon, in the evening, and at bedtime. (0800,1200, 1600 & 2000)   03/06/2021   cefTRIAXone 1 g in dextrose 5 % 50 mL Inject 1 g into the vein daily.   03/06/2021   cholecalciferol (VITAMIN D) 25 MCG (1000 UNIT) tablet Take 1,000 Units by mouth daily.   03/06/2021   cycloSPORINE (RESTASIS) 0.05 % ophthalmic emulsion Place 1 drop into both eyes 2 (two) times daily. (0800 & 2000)   03/06/2021   Dulaglutide (TRULICITY) 3 0000000 SOPN Inject 3 mg into the skin every Friday. (0800)   Past Week   DULoxetine (CYMBALTA) 60 MG capsule Take 1 capsule (60 mg total) by mouth 2 (two) times daily. (0800)Give along with 30 mg  to = 90 mg (Patient taking differently: Take 60 mg by mouth 2 (two) times daily. (1000 & 1900)) 180 capsule 1 03/06/2021   furosemide (LASIX) 20 MG tablet Take 20 mg by mouth in the morning.   03/06/2021   gabapentin (NEURONTIN) 100 MG capsule Take 200 mg by mouth 3 (three) times daily. (0800, 1400 & 2000)   03/06/2021   guaiFENesin (MUCINEX) 600 MG 12 hr tablet Take 1,200 mg by mouth at bedtime. (2000)   03/06/2021   insulin glargine (LANTUS) 100 UNIT/ML  Solostar Pen Inject 15 Units into the skin at bedtime. (2000)   03/06/2021   loperamide (IMODIUM) 2 MG capsule Take 2 mg by mouth as needed for diarrhea or loose stools (SUBSEQUENT DOSE GIVE ONE TAB AFTER EACH STOOL (max 8 mg/24 hrs.)).   Past Month   magnesium hydroxide (MILK OF MAGNESIA) 400 MG/5ML suspension Take 30 mLs by mouth daily as needed for mild constipation.   Past Week   methimazole (TAPAZOLE) 5 MG tablet Take 1 tablet (5 mg total) by mouth daily. (Patient taking differently: Take 5 mg by mouth daily at 6 (six) AM. (0600)) 30 tablet 5 03/06/2021   metoprolol succinate (TOPROL-XL) 50 MG 24 hr tablet Take 50 mg by mouth daily. (0800)   03/06/2021   Multiple Vitamin (MULTIVITAMIN WITH MINERALS) TABS tablet Take 1 tablet by mouth daily. (0800)   03/06/2021   ondansetron (ZOFRAN) 4 MG tablet Take 4 mg by mouth every 6 (six) hours as needed for nausea or vomiting.   Past Week   pantoprazole (PROTONIX) 40 MG tablet Take 40 mg by mouth daily at 6 (six) AM. (0600)   03/06/2021   potassium chloride SA (KLOR-CON) 20 MEQ tablet Take 20 mEq by mouth 2 (two) times a week. Mondays & Fridays   Past Week   predniSONE (DELTASONE) 2.5 MG tablet Take 2.5 mg by mouth every other day. (0800) Alternating days with 5 mg dose   03/06/2021   predniSONE (DELTASONE) 5 MG tablet Take 5 mg by mouth every other day. (0800) Alternating days with 2.5 mg dose   Past Week   SF 5000 PLUS 1.1 % CREA dental cream Take 1 application by mouth daily at 8 pm. (2000)      Sodium Phosphates (RA  ENEMA) 7-19 GM/118ML ENEM Place 118 mLs rectally daily as needed (severe constipation).   Past Month   solifenacin (VESICARE) 10 MG tablet Take 10 mg by mouth daily. (0800)   03/06/2021   TECFIDERA 240 MG CPDR Take 1 capsule (240 mg total) by mouth 2 (two) times daily. (0800 & 2000) 60 capsule 11 03/06/2021   vitamin C (ASCORBIC ACID) 500 MG tablet Take 500 mg by mouth in the morning and at bedtime.   03/06/2021   zinc sulfate 220 (50 Zn) MG capsule Take 220 mg by mouth daily.      ALPRAZolam (XANAX) 0.5 MG tablet Take 2 tablets approximately 45 minutes prior to the MRI study, take a third tablet if needed. 3 tablet 0    Allergies:  Allergies  Allergen Reactions   Codeine Other (See Comments)    Difficult breathing and skin problem   Ultram [Tramadol] Other (See Comments)    Difficult breathing and skin peeling   Januvia [Sitagliptin] Rash and Other (See Comments)    Blisters    Family History  Problem Relation Age of Onset   Multiple sclerosis Other        neices.    Cancer Mother    Diabetes Mother    GI Bleed Sister        diverticulitis   Thyroid disease Neg Hx    Social History:  reports that she has never smoked. She has never used smokeless tobacco. She reports that she does not drink alcohol and does not use drugs.  ROS: A complete review of systems was performed.  All systems are negative except for pertinent findings as noted. ROS   Physical Exam:  Vital signs in last 24 hours: Temp:  [99.6 F (37.6  C)] 99.6 F (37.6 C) (09/07 1035) Pulse Rate:  [120] 120 (09/07 1035) Resp:  [18] 18 (09/07 1035) BP: (127)/(71) 127/71 (09/07 1035) SpO2:  [96 %] 96 % (09/07 1035) General:  Alert and oriented, No acute distress HEENT: Normocephalic, atraumatic Neck: No JVD or lymphadenopathy Cardiovascular: Regular rate and rhythm Lungs: Regular rate and effort Abdomen: Soft, nontender, nondistended, no abdominal masses Back: No CVA tenderness Extremities: No edema Neurologic:  Grossly intact  Laboratory Data:  Results for orders placed or performed during the hospital encounter of 03/07/21 (from the past 24 hour(s))  Hemoglobin A1c per protocol     Status: None   Collection Time: 03/07/21 10:21 AM  Result Value Ref Range   Hgb A1c MFr Bld 5.5 4.8 - 5.6 %   Mean Plasma Glucose 111.15 mg/dL  Basic metabolic panel per protocol     Status: Abnormal   Collection Time: 03/07/21 10:21 AM  Result Value Ref Range   Sodium 138 135 - 145 mmol/L   Potassium 2.8 (L) 3.5 - 5.1 mmol/L   Chloride 96 (L) 98 - 111 mmol/L   CO2 28 22 - 32 mmol/L   Glucose, Bld 194 (H) 70 - 99 mg/dL   BUN <5 (L) 8 - 23 mg/dL   Creatinine, Ser 0.47 0.44 - 1.00 mg/dL   Calcium 8.9 8.9 - 10.3 mg/dL   GFR, Estimated >60 >60 mL/min   Anion gap 14 5 - 15  Glucose, capillary     Status: Abnormal   Collection Time: 03/07/21 10:30 AM  Result Value Ref Range   Glucose-Capillary 187 (H) 70 - 99 mg/dL   Recent Results (from the past 240 hour(s))  Resp Panel by RT-PCR (Flu A&B, Covid) Nasopharyngeal Swab     Status: None   Collection Time: 03/06/21  1:58 AM   Specimen: Nasopharyngeal Swab; Nasopharyngeal(NP) swabs in vial transport medium  Result Value Ref Range Status   SARS Coronavirus 2 by RT PCR NEGATIVE NEGATIVE Final    Comment: (NOTE) SARS-CoV-2 target nucleic acids are NOT DETECTED.  The SARS-CoV-2 RNA is generally detectable in upper respiratory specimens during the acute phase of infection. The lowest concentration of SARS-CoV-2 viral copies this assay can detect is 138 copies/mL. A negative result does not preclude SARS-Cov-2 infection and should not be used as the sole basis for treatment or other patient management decisions. A negative result may occur with  improper specimen collection/handling, submission of specimen other than nasopharyngeal swab, presence of viral mutation(s) within the areas targeted by this assay, and inadequate number of viral copies(<138 copies/mL). A  negative result must be combined with clinical observations, patient history, and epidemiological information. The expected result is Negative.  Fact Sheet for Patients:  EntrepreneurPulse.com.au  Fact Sheet for Healthcare Providers:  IncredibleEmployment.be  This test is no t yet approved or cleared by the [place] FDA and  has been authorized for detection and/or diagnosis of SARS-CoV-2 by FDA under an Emergency Use Authorization (EUA). This EUA will remain  in effect (meaning this test can be used) for the duration of the COVID-19 declaration under Section 564(b)(1) of the Act, 21 U.S.C.section 360bbb-3(b)(1), unless the authorization is terminated  or revoked sooner.       Influenza A by PCR NEGATIVE NEGATIVE Final   Influenza B by PCR NEGATIVE NEGATIVE Final    Comment: (NOTE) The Xpert Xpress SARS-CoV-2/FLU/RSV plus assay is intended as an aid in the diagnosis of influenza from Nasopharyngeal swab specimens and should not be used  as a sole basis for treatment. Nasal washings and aspirates are unacceptable for Xpert Xpress SARS-CoV-2/FLU/RSV testing.  Fact Sheet for Patients: EntrepreneurPulse.com.au  Fact Sheet for Healthcare Providers: IncredibleEmployment.be  This test is not yet approved or cleared by the [place] FDA and has been authorized for detection and/or diagnosis of SARS-CoV-2 by FDA under an Emergency Use Authorization (EUA). This EUA will remain in effect (meaning this test can be used) for the duration of the COVID-19 declaration under Section 564(b)(1) of the Act, 21 U.S.C. section 360bbb-3(b)(1), unless the authorization is terminated or revoked.  Performed at [facility], [place] [address], [place], [place] [address]    Creatinine: Recent Labs    03/06/21 0052 03/07/21 1021  CREATININE 0.45 0.47    Impression/Assessment:  Neurogenic  bladder  Plan:  Proceed with enteral detrusor Botox with suprapubic catheter exchange  Marton Redwood, III 03/07/2021, 11:49 AM

## 2021-03-07 NOTE — Progress Notes (Signed)
Report called to [facility] and nursing facility phone number [phone].  Son Leane Para updated on patient status and that she was leaving [facility] to return back to rehab facility he thanked me for calling.

## 2021-03-07 NOTE — Anesthesia Preprocedure Evaluation (Addendum)
Anesthesia Evaluation  Patient identified by MRN, date of birth, ID band Patient awake    Reviewed: Allergy & Precautions, NPO status , Patient's Chart, lab work & pertinent test results, reviewed documented beta blocker date and time   History of Anesthesia Complications Negative for: history of anesthetic complications  Airway Mallampati: III  TM Distance: >3 FB Neck ROM: Full    Dental  (+) Dental Advisory Given, Poor Dentition, Missing   Pulmonary neg pulmonary ROS,    Pulmonary exam normal        Cardiovascular hypertension, Pt. on home beta blockers and Pt. on medications  Rhythm:Regular Rate:Tachycardia     Neuro/Psych PSYCHIATRIC DISORDERS Depression  Neuromuscular disease (MS, LE paraplegia, polyneuropathy)    GI/Hepatic Neg liver ROS, GERD  Medicated, Fecal incontinence    Endo/Other  diabetes, Type 2, Oral Hypoglycemic AgentsHyperthyroidism  Nontoxic multinodular goiter K 3.3 Ca 7.3 Obesity   Renal/GU negative Renal ROS     Musculoskeletal  (+) Arthritis ,  Gout    Abdominal   Peds  Hematology  (+) anemia ,  WBC >17k    Anesthesia Other Findings   Reproductive/Obstetrics                          Anesthesia Physical Anesthesia Plan  ASA: 3  Anesthesia Plan: General   Post-op Pain Management:    Induction: Intravenous  PONV Risk Score and Plan: 3 and Treatment may vary due to age or medical condition, Ondansetron and Dexamethasone  Airway Management Planned: LMA  Additional Equipment: None  Intra-op Plan:   Post-operative Plan: Extubation in OR  Informed Consent: I have reviewed the patients History and Physical, chart, labs and discussed the procedure including the risks, benefits and alternatives for the proposed anesthesia with the patient or authorized representative who has indicated his/her understanding and acceptance.     Dental advisory  given  Plan Discussed with: CRNA and Anesthesiologist  Anesthesia Plan Comments:        Anesthesia Quick Evaluation

## 2021-03-07 NOTE — Anesthesia Postprocedure Evaluation (Signed)
Anesthesia Post Note  Patient: JANARI AVIS  Procedure(s) Performed: CYSTOSCOPY BOTOX INJECTION AND SUPRAPUBIC TUBE EXCHANGE     Patient location during evaluation: PACU Anesthesia Type: General Level of consciousness: awake and alert Pain management: pain level controlled Vital Signs Assessment: post-procedure vital signs reviewed and stable Respiratory status: spontaneous breathing, nonlabored ventilation and respiratory function stable Cardiovascular status: blood pressure returned to baseline and stable Postop Assessment: no apparent nausea or vomiting Anesthetic complications: no   No notable events documented.  Last Vitals:  Vitals:   03/07/21 1413 03/07/21 1415  BP: (!) 159/84 (!) 153/82  Pulse: (!) 112 (!) 111  Resp: 20 18  Temp: 37.4 C   SpO2: 92% 90%    Last Pain:  Vitals:   03/07/21 1415  TempSrc:   PainSc: 0-No pain                 Audry Pili

## 2021-03-08 ENCOUNTER — Encounter (HOSPITAL_COMMUNITY): Payer: Self-pay | Admitting: Urology

## 2021-03-09 ENCOUNTER — Other Ambulatory Visit: Payer: Self-pay

## 2021-03-09 ENCOUNTER — Ambulatory Visit (HOSPITAL_COMMUNITY)
Admission: RE | Admit: 2021-03-09 | Discharge: 2021-03-09 | Disposition: A | Discharge status: Home / Self Care | Payer: Medicare Other | Source: Ambulatory Visit | Attending: Neurology | Admitting: Neurology

## 2021-03-09 DIAGNOSIS — G35 Multiple sclerosis: Secondary | ICD-10-CM | POA: Insufficient documentation

## 2021-03-09 MED ORDER — GADOBUTROL 1 MMOL/ML IV SOLN
7.0000 mL | Freq: Once | INTRAVENOUS | Status: AC | PRN
  Administered 2021-03-09: 7 mL via INTRAVENOUS

## 2021-03-12 ENCOUNTER — Other Ambulatory Visit (HOSPITAL_COMMUNITY): Payer: Self-pay | Admitting: Internal Medicine

## 2021-03-12 ENCOUNTER — Other Ambulatory Visit: Payer: Self-pay | Admitting: Internal Medicine

## 2021-03-12 ENCOUNTER — Telehealth: Payer: Self-pay | Admitting: Neurology

## 2021-03-12 NOTE — Telephone Encounter (Signed)
  I called the patient.  MRI of the brain and cervical spine appear to be relatively stable.  No new lesions seen.  There appears to be a left thyroid nodule that has enlarged over time, if the patient is not aware of this and his not being followed over time, I will be happy to set up an ultrasound of the thyroid, she will contact me if this is the case.   MRI brain and cervical 03/12/21:   IMPRESSION: 1. History of multiple sclerosis with stable white matter disease and mild cord involvement. No enhancing disease. 2. Notable enlargement of the left lobe thyroid with tracheal deviation, a significant change from a 2007 chest CT. If appropriate for comorbidities, recommend thyroid US (ref: J Am Coll Radiol. 2015 Feb;12(2): 143-50). 3. Generalized cervical spine degeneration with notable spinal stenosis at C4-5 causing mass effect on the cord. Multilevel foraminal impingement described above.

## 2021-03-13 ENCOUNTER — Non-Acute Institutional Stay (SKILLED_NURSING_FACILITY): Payer: Medicare Other | Admitting: Internal Medicine

## 2021-03-13 ENCOUNTER — Other Ambulatory Visit (HOSPITAL_COMMUNITY): Payer: Self-pay | Admitting: Internal Medicine

## 2021-03-13 ENCOUNTER — Encounter: Payer: Self-pay | Admitting: Internal Medicine

## 2021-03-13 DIAGNOSIS — E0590 Thyrotoxicosis, unspecified without thyrotoxic crisis or storm: Secondary | ICD-10-CM | POA: Diagnosis not present

## 2021-03-13 DIAGNOSIS — D72829 Elevated white blood cell count, unspecified: Secondary | ICD-10-CM

## 2021-03-13 DIAGNOSIS — L89159 Pressure ulcer of sacral region, unspecified stage: Secondary | ICD-10-CM

## 2021-03-13 DIAGNOSIS — G35 Multiple sclerosis: Secondary | ICD-10-CM

## 2021-03-13 DIAGNOSIS — R197 Diarrhea, unspecified: Secondary | ICD-10-CM | POA: Diagnosis not present

## 2021-03-13 DIAGNOSIS — M869 Osteomyelitis, unspecified: Secondary | ICD-10-CM

## 2021-03-13 NOTE — Assessment & Plan Note (Addendum)
03/09/2021 MRI of the brain and cervical spine revealed essentially stable disease.  Incidental finding was enlargement of the left thyroid 03/09/21 on head/cervical spine MRI vs. 2007 chest CT

## 2021-03-13 NOTE — Progress Notes (Signed)
NURSING HOME LOCATION:  [facility] ROOM NUMBER:  [id number]  CODE STATUS:  DNR  PCP:  Martie Lee GNP-C, [facility]   This is a nursing facility follow up visit for specific acute issue of enlarging thyroid lobe on cns imaging.  Interim medical record and care since last SNF visit was updated with review of diagnostic studies and change in clinical status since last visit were documented.  HPI: Dr. Jannifer Franklin , her Neurologist saw her 02/20/2021.  She described recent increase in generalized pain.  Dr. Jannifer Franklin questioned whether this might be related to decrease in the gabapentin dose versus an exacerbation of MS due to a progression of possible spinal cord lesions.  She does have MS with extensive brain and spinal cord involvement with associated paralysis, neurogenic bladder, and neurogenic bowel.  Also her Tecfidera had been interrupted for approximately 2 weeks in July apparently. To assess the pain Dr. Jannifer Franklin ordered MRI of the head & cervical spine with and without contrast.  This revealed essentially stable cervical spine and CNS findings; but did reveal an enlarging left thyroid lobe.  Thyroid function tests  9/7 revealed a free T3 of 2.9 (2.5-3.9),Free T4 1.58 (0.93-1.70), and TSH 1.24 (0.2 7-4.20).  The patient has a history of multinodular goiter and has been followed by Dr. Loanne Drilling, Endocrinologist.He had prescribed methimazole. She is on 5 mg daily. The patient was seen in the ED 9/6 with altered mental status described as lethargy.   She had been treated  for possible UTI several days prior to the ED visit.  The patient had been on Cipro pending cultures . She was transitioned to Rocephin 1 g IM x3 days because of fever & persistent leukocytosis. C&S revealed only 15,000 colonies Enterococcus faecalis. The patient described chills and increasing weakness.  COVID and influenza screens were negative.  Chest x-ray revealed no acute process. She went back to the facility with  plans for suprapubic catheter placement by Dr. Gloriann Loan as already scheduled.  Dr. Gloriann Loan performed cystoscopy and Botox injection and suprapubic tube exchange 9/7. Because of pre-existing sacral wound imaging was performed 9/9 at the SNF.  This revealed no evidence of sacrococcygeal fracture or osteomyelitis.  White count was 18,100 with a left shift.  She did exhibit an iron deficiency anemia with hemoglobin 10.9 hematocrit 32.6 with normochromic, normocytic indices.  Iron level was 24. Because of persistent leukocytosis with WBC ranging from 17,200 to 19,200; the [facility] NP collected stool for C. difficile.  The elevated white count is also in the context of maintenance low-dose prednisone. The patient states that she has had intermittent loose to watery stool for several weeks.  The patient thought the bowel changes were related to Trulicity. C. difficile toxin was positive within the last 48 hours and Vancomycin 250 mg qid X 10 days prescribed.  ROS: Other than signs & symptoms delineated above; she denies any infectious symptoms.  Constitutional: No significant weight change Eyes: No redness, discharge, pain, vision change ENT/mouth: No nasal congestion,  purulent discharge, earache, change in hearing, sore throat  Cardiovascular: No chest pain, palpitations, paroxysmal nocturnal dyspnea, edema  Respiratory: No cough, sputum production, hemoptysis  Gastrointestinal: No heartburn, dysphagia, abdominal pain, nausea /vomiting, rectal bleeding, melena Genitourinary: N, hematuria, pyuria Musculoskeletal: No joint stiffness, joint swelling, weakness, pain Dermatologic: No new rash or lesion , pruritus, change in appearance of skin Neurologic: No dizziness, headache, syncope, seizures Endocrine: No change in hair/skin/nails, excessive thirst, excessive hunger, excessive urination  Hematologic/lymphatic:  No significant bruising, lymphadenopathy,bleeding Allergy/immunology: No itchy/watery eyes, significant  sneezing, urticaria, angioedema  Physical exam:  Pertinent or positive findings: Weight excess is present. She  appears anxious , unlike her usual calm demeanor despite her advance co-morbidities. Missing multiple mandibular teeth.Breath sounds are decreased.  She has a resting tachycardia.  Bowel sounds are hyperactive.  Suprapubic tube is present.  Pedal pulses are decreased.  Feet are in booties.  There is no range of motion in the lower extremities and minimal in the upper extremities.  There is marked interosseous wasting of the hands.  General appearance: Adequately nourished; no acute distress, increased work of breathing is present.   Lymphatic: No lymphadenopathy about the head, neck, axilla. Eyes: No conjunctival inflammation or lid edema is present. There is no scleral icterus. Ears:  External ear exam shows no significant lesions or deformities.   Nose:  External nasal examination shows no deformity or inflammation. Nasal mucosa are pink and moist without lesions, exudates Oral exam:  Lips and gums are healthy appearing. There is no oropharyngeal erythema or exudate. Neck:  No thyromegaly, masses, tenderness noted.    Heart:  No murmur, click, rub .  Lungs:  without wheezes, rhonchi, rales, rubs. Abdomen: Bowel sounds are normal. Abdomen is soft and nontender with no organomegaly, hernias, masses. GU: Deferred  Extremities:  No cyanosis, clubbing, edema  Neurologic exam :Balance, Rhomberg, finger to nose testing could not be completed due to clinical state Skin: Warm & dry w/o tenting. No significant  rash.  See summary under each active problem in the Problem List with associated updated therapeutic plan

## 2021-03-13 NOTE — Assessment & Plan Note (Signed)
Martie Lee GNP-C [facility] NP Rxed PO Vanc 250 mg qid X 10 days

## 2021-03-13 NOTE — Patient Instructions (Addendum)
See assessment and plan under each diagnosis in the problem list and acutely for this visit Total time 39  minutes; greater than 50% of the visit spent counseling patient and coordinating care for problems addressed at this encounter

## 2021-03-13 NOTE — Assessment & Plan Note (Addendum)
Portable imaging @ SNF 03/09/2021 did not reveal osteomyelitis

## 2021-03-13 NOTE — Assessment & Plan Note (Addendum)
Most recent white blood counts have varied from 17,200 up to 19,200 with a left shift.  This is in the context of chronic low-dose prednisone therapy and positive C. difficile toxin.

## 2021-03-13 NOTE — Assessment & Plan Note (Addendum)
03/07/2021 Free T3 2.9 (2.5-3.9), free T4 1.58 (0.93-1.7L), and TSH 1.24 (0.27-4.20). Notify Dr. Loanne Drilling of apparent thyroid lobe progressive enlargement (Note: 2 different imaging modalities 15 years apart)

## 2021-03-23 ENCOUNTER — Ambulatory Visit (HOSPITAL_COMMUNITY)
Admission: RE | Admit: 2021-03-23 | Discharge: 2021-03-23 | Disposition: A | Discharge status: Home / Self Care | Payer: Medicare Other | Source: Ambulatory Visit | Attending: Internal Medicine | Admitting: Internal Medicine

## 2021-03-23 ENCOUNTER — Other Ambulatory Visit: Payer: Self-pay

## 2021-03-23 DIAGNOSIS — M869 Osteomyelitis, unspecified: Secondary | ICD-10-CM | POA: Diagnosis not present

## 2021-03-28 ENCOUNTER — Encounter: Payer: Self-pay | Admitting: Internal Medicine

## 2021-03-28 ENCOUNTER — Non-Acute Institutional Stay (SKILLED_NURSING_FACILITY): Payer: Medicare Other | Admitting: Internal Medicine

## 2021-03-28 DIAGNOSIS — F339 Major depressive disorder, recurrent, unspecified: Secondary | ICD-10-CM

## 2021-03-28 DIAGNOSIS — L89150 Pressure ulcer of sacral region, unstageable: Secondary | ICD-10-CM | POA: Diagnosis not present

## 2021-03-28 NOTE — Assessment & Plan Note (Addendum)
03/23/2021 MRI revealed deep midline sacral decubitus extending to the bone with acute on chronic osteomyelitis of the sacrum and coccyx.   3 cm abscess just to the right of midline at the level of the upper sacrum, superior to the decubitus ulcer. Vancomycin initiated via PICC line 9/22 pending ID consultation 9/29.Copy of current labs given to her to share with Dr Lucianne Lei Dam,ID. Wound care team which attends this facility will be actively involved in her care. Discuss with ID indication for surgical versus IR approach to the abscess.

## 2021-03-28 NOTE — Patient Instructions (Signed)
See assessment and plan under each diagnosis acutely for this visit

## 2021-03-28 NOTE — Assessment & Plan Note (Addendum)
She is on Cymbalta and has been followed by the Psychiatric NP. I explained that her depression is a natural& expected response to the serious health conditions she has.  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 I stressed that in addition to the [facility] NP who has been most diligent; ID and wound care team will be involved directly in her care.

## 2021-03-28 NOTE — Progress Notes (Signed)
NURSING HOME LOCATION:  [facility] ROOM NUMBER:  [id number]  CODE STATUS:  DNR  PCP:  Martie Lee ,NP , [facility]  This is a nursing facility follow up visit for specific acute issue of osteomyelitis.  Interim medical record and care since last SNF visit was updated with review of diagnostic studies and change in clinical status since last visit were documented.  HPI: She has exhibited persistent leukocytosis with intermittent fever.  Extensive evaluation has failed to reveal a definitive cause although stool was positive for C. difficile.  Urine culture revealed only 15,000 colonies of Enterococcus despite the presence of a suprapubic catheter and neurogenic bladder. The patient has a sacral wound which has become more deep without definite clinical cellulitis or abscess.  Portable imaging @ the SNF revealed no sacral or coccygeal fracture or osteomyelitis. Specifically white count had ranged from 17,200 up to 19,200.  Despite definitive treatment of the C. difficile isolate; white count actually rose to 25,100 with 17.9 neutrophils indicating left shift. Most current C-reactive protein is 18.53. The [facility] NP initiated doxycycline 9/21; but this was changed to  vancomycin via PICC line 9/22 pending MRI which she astutely requested because of persistent and progressive leukocytosis despite the relatively benign findings on mobile imaging.  This was completed 9/23 and revealed deep midline sacral decubitus extending to the bone with acute on chronic osteomyelitis of the sacrum and coccyx.  Also noted was a 3 cm abscess just to the right of midline at the level of the upper sacrum superior to the decubitus ulcer.  ID consultation was also requested.  Review of systems: She denies any pain at the site of the wound.  This is in the context of her peripheral neuropathy due to multiple sclerosis.  Both she and the wound care nurse at the facility document that 3-4 years ago she had a  wound VAC for several months.  At that time she was on PACE and being followed at the [facility]. Today she admits to depression related to the present situation. She describes some queasiness which she feels may be related to constipation.  Constitutional: No fever, significant weight change Eyes: No redness, discharge, pain, vision change ENT/mouth: No nasal congestion,  purulent discharge, earache, change in hearing, sore throat  Cardiovascular: No chest pain, palpitations, paroxysmal nocturnal dyspnea Respiratory: No cough, sputum production, hemoptysis Gastrointestinal: No heartburn, dysphagia, abdominal pain Musculoskeletal: No joint stiffness, joint swelling, pain Neurologic: No dizziness, headache, syncope, seizures Endocrine: No  excessive thirst, excessive hunger Hematologic/lymphatic: No significant bruising, lymphadenopathy, abnormal bleeding  Physical exam:  Pertinent or positive findings: Weight excess, especially centrally is present.  Affect is markedly flat.  Breath sounds are decreased.  Slight tachycardia is present at rest.  Suprapubic catheter is present.  Pedal pulses are decreased.  There is minimal range of motion in upper extremities and none in the lower extremities.  Hands reveal marked interosseous wasting.  General appearance: Adequately nourished; no acute distress, increased work of breathing is present.   Lymphatic: No lymphadenopathy about the head, neck, axilla. Eyes: No conjunctival inflammation or lid edema is present. There is no scleral icterus. Ears:  External ear exam shows no significant lesions or deformities.   Nose:  External nasal examination shows no deformity or inflammation. Nasal mucosa are pink and moist without lesions, exudates Oral exam:  Lips and gums are healthy appearing. There is no oropharyngeal erythema or exudate. Neck:  No thyromegaly, masses, tenderness noted.  Heart:  No gallop, murmur, click, rub .  Lungs:  without  wheezes, rhonchi, rales, rubs. Abdomen: Bowel sounds are normal. Abdomen is soft and nontender with no organomegaly, hernias, masses. GU: Deferred  Extremities:  No cyanosis, clubbing Skin: Warm & dry w/o tenting. No significant lesions or rash; sacral wound not examined.  See summary under each active problem in the Problem List with associated updated therapeutic plan

## 2021-03-29 ENCOUNTER — Ambulatory Visit: Payer: Medicare Other | Admitting: Infectious Disease

## 2021-04-05 ENCOUNTER — Encounter: Payer: Self-pay | Admitting: Infectious Disease

## 2021-04-05 ENCOUNTER — Inpatient Hospital Stay (HOSPITAL_COMMUNITY)
Admission: EM | Admit: 2021-04-05 | Discharge: 2021-04-14 | DRG: 628 | Disposition: A | Discharge status: Skilled Nursing Facility | Payer: Medicare Other | Source: Skilled Nursing Facility | Attending: Internal Medicine | Admitting: Internal Medicine

## 2021-04-05 ENCOUNTER — Encounter (HOSPITAL_COMMUNITY): Payer: Self-pay | Admitting: Oncology

## 2021-04-05 ENCOUNTER — Telehealth: Payer: Self-pay

## 2021-04-05 ENCOUNTER — Other Ambulatory Visit (HOSPITAL_COMMUNITY): Payer: Self-pay

## 2021-04-05 ENCOUNTER — Ambulatory Visit (INDEPENDENT_AMBULATORY_CARE_PROVIDER_SITE_OTHER): Payer: Medicare Other | Admitting: Infectious Disease

## 2021-04-05 ENCOUNTER — Other Ambulatory Visit: Payer: Self-pay

## 2021-04-05 ENCOUNTER — Emergency Department (HOSPITAL_COMMUNITY): Payer: Medicare Other

## 2021-04-05 VITALS — BP 127/74 | HR 104 | Temp 97.4°F

## 2021-04-05 DIAGNOSIS — G35 Multiple sclerosis: Secondary | ICD-10-CM | POA: Diagnosis present

## 2021-04-05 DIAGNOSIS — Z9359 Other cystostomy status: Secondary | ICD-10-CM

## 2021-04-05 DIAGNOSIS — M4628 Osteomyelitis of vertebra, sacral and sacrococcygeal region: Secondary | ICD-10-CM | POA: Diagnosis present

## 2021-04-05 DIAGNOSIS — L89150 Pressure ulcer of sacral region, unstageable: Secondary | ICD-10-CM | POA: Diagnosis not present

## 2021-04-05 DIAGNOSIS — E039 Hypothyroidism, unspecified: Secondary | ICD-10-CM | POA: Diagnosis present

## 2021-04-05 DIAGNOSIS — Z820 Family history of epilepsy and other diseases of the nervous system: Secondary | ICD-10-CM

## 2021-04-05 DIAGNOSIS — M792 Neuralgia and neuritis, unspecified: Secondary | ICD-10-CM

## 2021-04-05 DIAGNOSIS — J9610 Chronic respiratory failure, unspecified whether with hypoxia or hypercapnia: Secondary | ICD-10-CM | POA: Diagnosis present

## 2021-04-05 DIAGNOSIS — Z833 Family history of diabetes mellitus: Secondary | ICD-10-CM

## 2021-04-05 DIAGNOSIS — D638 Anemia in other chronic diseases classified elsewhere: Secondary | ICD-10-CM | POA: Diagnosis present

## 2021-04-05 DIAGNOSIS — J9612 Chronic respiratory failure with hypercapnia: Secondary | ICD-10-CM | POA: Diagnosis not present

## 2021-04-05 DIAGNOSIS — E042 Nontoxic multinodular goiter: Secondary | ICD-10-CM | POA: Diagnosis present

## 2021-04-05 DIAGNOSIS — Z794 Long term (current) use of insulin: Secondary | ICD-10-CM | POA: Diagnosis not present

## 2021-04-05 DIAGNOSIS — N319 Neuromuscular dysfunction of bladder, unspecified: Secondary | ICD-10-CM

## 2021-04-05 DIAGNOSIS — G8220 Paraplegia, unspecified: Secondary | ICD-10-CM | POA: Diagnosis present

## 2021-04-05 DIAGNOSIS — L8994 Pressure ulcer of unspecified site, stage 4: Secondary | ICD-10-CM | POA: Insufficient documentation

## 2021-04-05 DIAGNOSIS — B964 Proteus (mirabilis) (morganii) as the cause of diseases classified elsewhere: Secondary | ICD-10-CM | POA: Diagnosis present

## 2021-04-05 DIAGNOSIS — E1140 Type 2 diabetes mellitus with diabetic neuropathy, unspecified: Secondary | ICD-10-CM | POA: Diagnosis present

## 2021-04-05 DIAGNOSIS — E876 Hypokalemia: Secondary | ICD-10-CM | POA: Diagnosis present

## 2021-04-05 DIAGNOSIS — F32A Depression, unspecified: Secondary | ICD-10-CM | POA: Diagnosis present

## 2021-04-05 DIAGNOSIS — K529 Noninfective gastroenteritis and colitis, unspecified: Secondary | ICD-10-CM | POA: Diagnosis present

## 2021-04-05 DIAGNOSIS — R600 Localized edema: Secondary | ICD-10-CM | POA: Diagnosis present

## 2021-04-05 DIAGNOSIS — Z20822 Contact with and (suspected) exposure to covid-19: Secondary | ICD-10-CM | POA: Diagnosis present

## 2021-04-05 DIAGNOSIS — L89154 Pressure ulcer of sacral region, stage 4: Secondary | ICD-10-CM | POA: Diagnosis present

## 2021-04-05 DIAGNOSIS — E1169 Type 2 diabetes mellitus with other specified complication: Secondary | ICD-10-CM | POA: Diagnosis present

## 2021-04-05 DIAGNOSIS — Z1624 Resistance to multiple antibiotics: Secondary | ICD-10-CM | POA: Diagnosis present

## 2021-04-05 DIAGNOSIS — M869 Osteomyelitis, unspecified: Secondary | ICD-10-CM | POA: Diagnosis not present

## 2021-04-05 DIAGNOSIS — Z885 Allergy status to narcotic agent status: Secondary | ICD-10-CM

## 2021-04-05 DIAGNOSIS — L0291 Cutaneous abscess, unspecified: Secondary | ICD-10-CM

## 2021-04-05 DIAGNOSIS — K219 Gastro-esophageal reflux disease without esophagitis: Secondary | ICD-10-CM | POA: Diagnosis present

## 2021-04-05 DIAGNOSIS — M1990 Unspecified osteoarthritis, unspecified site: Secondary | ICD-10-CM | POA: Diagnosis present

## 2021-04-05 DIAGNOSIS — Z7401 Bed confinement status: Secondary | ICD-10-CM

## 2021-04-05 DIAGNOSIS — K592 Neurogenic bowel, not elsewhere classified: Secondary | ICD-10-CM | POA: Diagnosis present

## 2021-04-05 DIAGNOSIS — E559 Vitamin D deficiency, unspecified: Secondary | ICD-10-CM | POA: Diagnosis present

## 2021-04-05 DIAGNOSIS — E669 Obesity, unspecified: Secondary | ICD-10-CM | POA: Diagnosis present

## 2021-04-05 DIAGNOSIS — Z95828 Presence of other vascular implants and grafts: Secondary | ICD-10-CM | POA: Diagnosis not present

## 2021-04-05 DIAGNOSIS — M810 Age-related osteoporosis without current pathological fracture: Secondary | ICD-10-CM | POA: Diagnosis present

## 2021-04-05 DIAGNOSIS — Z6833 Body mass index (BMI) 33.0-33.9, adult: Secondary | ICD-10-CM

## 2021-04-05 DIAGNOSIS — I10 Essential (primary) hypertension: Secondary | ICD-10-CM | POA: Diagnosis present

## 2021-04-05 DIAGNOSIS — Z66 Do not resuscitate: Secondary | ICD-10-CM | POA: Diagnosis present

## 2021-04-05 DIAGNOSIS — Z90710 Acquired absence of both cervix and uterus: Secondary | ICD-10-CM

## 2021-04-05 DIAGNOSIS — L8990 Pressure ulcer of unspecified site, unspecified stage: Secondary | ICD-10-CM | POA: Insufficient documentation

## 2021-04-05 DIAGNOSIS — E785 Hyperlipidemia, unspecified: Secondary | ICD-10-CM | POA: Diagnosis present

## 2021-04-05 DIAGNOSIS — Z888 Allergy status to other drugs, medicaments and biological substances status: Secondary | ICD-10-CM

## 2021-04-05 DIAGNOSIS — Z96653 Presence of artificial knee joint, bilateral: Secondary | ICD-10-CM | POA: Diagnosis present

## 2021-04-05 DIAGNOSIS — L8944 Pressure ulcer of contiguous site of back, buttock and hip, stage 4: Secondary | ICD-10-CM

## 2021-04-05 DIAGNOSIS — Z87440 Personal history of urinary (tract) infections: Secondary | ICD-10-CM

## 2021-04-05 DIAGNOSIS — Z79899 Other long term (current) drug therapy: Secondary | ICD-10-CM

## 2021-04-05 DIAGNOSIS — M8660 Other chronic osteomyelitis, unspecified site: Secondary | ICD-10-CM | POA: Diagnosis present

## 2021-04-05 DIAGNOSIS — R110 Nausea: Secondary | ICD-10-CM | POA: Diagnosis present

## 2021-04-05 HISTORY — DX: Osteomyelitis of vertebra, sacral and sacrococcygeal region: M46.28

## 2021-04-05 HISTORY — DX: Osteomyelitis, unspecified: M86.9

## 2021-04-05 LAB — CBC WITH DIFFERENTIAL/PLATELET
Abs Immature Granulocytes: 0.15 10*3/uL — ABNORMAL HIGH (ref 0.00–0.07)
Basophils Absolute: 0.1 10*3/uL (ref 0.0–0.1)
Basophils Relative: 0 %
Eosinophils Absolute: 0.1 10*3/uL (ref 0.0–0.5)
Eosinophils Relative: 0 %
HCT: 31.2 % — ABNORMAL LOW (ref 36.0–46.0)
Hemoglobin: 9.8 g/dL — ABNORMAL LOW (ref 12.0–15.0)
Immature Granulocytes: 1 %
Lymphocytes Relative: 7 %
Lymphs Abs: 1.2 10*3/uL (ref 0.7–4.0)
MCH: 26.9 pg (ref 26.0–34.0)
MCHC: 31.4 g/dL (ref 30.0–36.0)
MCV: 85.7 fL (ref 80.0–100.0)
Monocytes Absolute: 0.6 10*3/uL (ref 0.1–1.0)
Monocytes Relative: 4 %
Neutro Abs: 14.9 10*3/uL — ABNORMAL HIGH (ref 1.7–7.7)
Neutrophils Relative %: 88 %
Platelets: 585 10*3/uL — ABNORMAL HIGH (ref 150–400)
RBC: 3.64 MIL/uL — ABNORMAL LOW (ref 3.87–5.11)
RDW: 19.9 % — ABNORMAL HIGH (ref 11.5–15.5)
WBC: 16.9 10*3/uL — ABNORMAL HIGH (ref 4.0–10.5)
nRBC: 0 % (ref 0.0–0.2)

## 2021-04-05 LAB — RESP PANEL BY RT-PCR (FLU A&B, COVID) ARPGX2
Influenza A by PCR: NEGATIVE
Influenza B by PCR: NEGATIVE
SARS Coronavirus 2 by RT PCR: NEGATIVE

## 2021-04-05 LAB — BASIC METABOLIC PANEL
Anion gap: 9 (ref 5–15)
BUN: 15 mg/dL (ref 8–23)
CO2: 35 mmol/L — ABNORMAL HIGH (ref 22–32)
Calcium: 8.5 mg/dL — ABNORMAL LOW (ref 8.9–10.3)
Chloride: 96 mmol/L — ABNORMAL LOW (ref 98–111)
Creatinine, Ser: 0.42 mg/dL — ABNORMAL LOW (ref 0.44–1.00)
GFR, Estimated: >60 mL/min (ref >60)
Glucose, Bld: 107 mg/dL — ABNORMAL HIGH (ref 70–99)
Potassium: 3.4 mmol/L — ABNORMAL LOW (ref 3.5–5.1)
Sodium: 140 mmol/L (ref 135–145)

## 2021-04-05 LAB — C-REACTIVE PROTEIN: CRP: 7.6 mg/dL — ABNORMAL HIGH (ref <1.0)

## 2021-04-05 LAB — PROTIME-INR
INR: 1.2 (ref 0.8–1.2)
Prothrombin Time: 14.8 seconds (ref 11.4–15.2)

## 2021-04-05 LAB — CBG MONITORING, ED: Glucose-Capillary: 127 mg/dL — ABNORMAL HIGH (ref 70–99)

## 2021-04-05 LAB — SEDIMENTATION RATE: Sed Rate: 88 mm/hr — ABNORMAL HIGH (ref 0–22)

## 2021-04-05 MED ORDER — ONDANSETRON HCL 4 MG PO TABS: 4.0000 mg | ORAL_TABLET | Freq: Four times a day (QID) | ORAL | Status: DC | PRN

## 2021-04-05 MED ORDER — PREDNISONE 5 MG PO TABS
5.0000 mg | ORAL_TABLET | ORAL | Status: DC
  Administered 2021-04-07 – 2021-04-12 (×3): 5 mg via ORAL
  Filled 2021-04-05 (×4): qty 1

## 2021-04-05 MED ORDER — ACETAMINOPHEN 325 MG PO TABS
650.0000 mg | ORAL_TABLET | Freq: Four times a day (QID) | ORAL | Status: DC | PRN
  Administered 2021-04-12 (×2): 650 mg via ORAL
  Filled 2021-04-05 (×2): qty 2

## 2021-04-05 MED ORDER — BACLOFEN 10 MG PO TABS
10.0000 mg | ORAL_TABLET | Freq: Every day | ORAL | Status: DC
  Administered 2021-04-06 – 2021-04-13 (×8): 10 mg via ORAL
  Filled 2021-04-05 (×8): qty 1

## 2021-04-05 MED ORDER — INSULIN GLARGINE-YFGN 100 UNIT/ML ~~LOC~~ SOLN
15.0000 [IU] | Freq: Every day | SUBCUTANEOUS | Status: DC
  Administered 2021-04-06 – 2021-04-13 (×9): 15 [IU] via SUBCUTANEOUS
  Filled 2021-04-05 (×11): qty 0.15

## 2021-04-05 MED ORDER — METOPROLOL SUCCINATE ER 50 MG PO TB24: 50.0000 mg | ORAL_TABLET | Freq: Every day | ORAL | Status: DC

## 2021-04-05 MED ORDER — DARIFENACIN HYDROBROMIDE ER 15 MG PO TB24
15.0000 mg | ORAL_TABLET | Freq: Every day | ORAL | Status: DC
  Administered 2021-04-08 – 2021-04-13 (×6): 15 mg via ORAL
  Filled 2021-04-05 (×9): qty 1

## 2021-04-05 MED ORDER — METOPROLOL SUCCINATE ER 50 MG PO TB24
50.0000 mg | ORAL_TABLET | Freq: Every day | ORAL | Status: DC
  Administered 2021-04-06 – 2021-04-13 (×8): 50 mg via ORAL
  Filled 2021-04-05 (×8): qty 1

## 2021-04-05 MED ORDER — BACLOFEN 20 MG PO TABS
20.0000 mg | ORAL_TABLET | Freq: Every day | ORAL | Status: DC
  Administered 2021-04-06 – 2021-04-13 (×9): 20 mg via ORAL
  Filled 2021-04-05 (×6): qty 1
  Filled 2021-04-05: qty 2
  Filled 2021-04-05 (×2): qty 1

## 2021-04-05 MED ORDER — INSULIN ASPART 100 UNIT/ML IJ SOLN
0.0000 [IU] | Freq: Three times a day (TID) | INTRAMUSCULAR | Status: DC
  Administered 2021-04-07: 3 [IU] via SUBCUTANEOUS
  Administered 2021-04-09 – 2021-04-11 (×4): 2 [IU] via SUBCUTANEOUS
  Administered 2021-04-12 – 2021-04-13 (×2): 3 [IU] via SUBCUTANEOUS
  Filled 2021-04-05: qty 0.15

## 2021-04-05 MED ORDER — FUROSEMIDE 20 MG PO TABS
20.0000 mg | ORAL_TABLET | Freq: Every morning | ORAL | Status: DC
  Administered 2021-04-06 – 2021-04-13 (×8): 20 mg via ORAL
  Filled 2021-04-05 (×8): qty 1

## 2021-04-05 MED ORDER — GABAPENTIN 100 MG PO CAPS
200.0000 mg | ORAL_CAPSULE | Freq: Three times a day (TID) | ORAL | Status: DC
  Administered 2021-04-06 – 2021-04-13 (×24): 200 mg via ORAL
  Filled 2021-04-05 (×24): qty 2

## 2021-04-05 MED ORDER — MAGNESIUM HYDROXIDE 400 MG/5ML PO SUSP: 30.0000 mL | Freq: Every day | ORAL | Status: DC | PRN

## 2021-04-05 MED ORDER — POTASSIUM CHLORIDE CRYS ER 20 MEQ PO TBCR
20.0000 meq | EXTENDED_RELEASE_TABLET | Freq: Two times a day (BID) | ORAL | Status: AC
  Administered 2021-04-06 (×2): 20 meq via ORAL
  Filled 2021-04-05 (×2): qty 1

## 2021-04-05 MED ORDER — INSULIN ASPART 100 UNIT/ML IJ SOLN
0.0000 [IU] | Freq: Every day | INTRAMUSCULAR | Status: DC
  Filled 2021-04-05: qty 0.05

## 2021-04-05 MED ORDER — BACLOFEN 10 MG PO TABS: 10.0000 mg | ORAL_TABLET | Freq: Three times a day (TID) | ORAL | Status: DC

## 2021-04-05 MED ORDER — DIMETHYL FUMARATE 240 MG PO CPDR
240.0000 mg | DELAYED_RELEASE_CAPSULE | Freq: Two times a day (BID) | ORAL | Status: DC
  Administered 2021-04-11 – 2021-04-12 (×2): 240 mg via ORAL

## 2021-04-05 MED ORDER — ONDANSETRON HCL 4 MG/2ML IJ SOLN
4.0000 mg | Freq: Four times a day (QID) | INTRAMUSCULAR | Status: DC | PRN
  Administered 2021-04-11: 4 mg via INTRAVENOUS
  Filled 2021-04-05: qty 2

## 2021-04-05 MED ORDER — PREDNISONE 5 MG PO TABS
2.5000 mg | ORAL_TABLET | ORAL | Status: DC
  Administered 2021-04-06 – 2021-04-13 (×5): 2.5 mg via ORAL
  Filled 2021-04-05 (×6): qty 1

## 2021-04-05 MED ORDER — INFLUENZA VAC A&B SA ADJ QUAD 0.5 ML IM PRSY
0.5000 mL | PREFILLED_SYRINGE | INTRAMUSCULAR | Status: DC | PRN
  Filled 2021-04-05: qty 0.5

## 2021-04-05 MED ORDER — ACETAMINOPHEN 650 MG RE SUPP: 650.0000 mg | Freq: Four times a day (QID) | RECTAL | Status: DC | PRN

## 2021-04-05 MED ORDER — METHIMAZOLE 5 MG PO TABS
5.0000 mg | ORAL_TABLET | Freq: Every day | ORAL | Status: DC
  Administered 2021-04-06 – 2021-04-13 (×8): 5 mg via ORAL
  Filled 2021-04-05 (×9): qty 1

## 2021-04-05 MED ORDER — ENOXAPARIN SODIUM 40 MG/0.4ML IJ SOSY
40.0000 mg | PREFILLED_SYRINGE | INTRAMUSCULAR | Status: DC
  Administered 2021-04-08 – 2021-04-13 (×6): 40 mg via SUBCUTANEOUS
  Filled 2021-04-05 (×7): qty 0.4

## 2021-04-05 MED ORDER — DULOXETINE HCL 60 MG PO CPEP
90.0000 mg | ORAL_CAPSULE | Freq: Two times a day (BID) | ORAL | Status: DC
  Administered 2021-04-06 – 2021-04-13 (×16): 90 mg via ORAL
  Filled 2021-04-05: qty 1
  Filled 2021-04-05: qty 3
  Filled 2021-04-05 (×10): qty 1
  Filled 2021-04-05: qty 3
  Filled 2021-04-05 (×3): qty 1

## 2021-04-05 MED ORDER — BISACODYL 10 MG RE SUPP: 10.0000 mg | Freq: Every day | RECTAL | Status: DC | PRN

## 2021-04-05 MED ORDER — BACLOFEN 10 MG PO TABS
15.0000 mg | ORAL_TABLET | Freq: Every day | ORAL | Status: DC
  Administered 2021-04-06 – 2021-04-13 (×8): 15 mg via ORAL
  Filled 2021-04-05 (×8): qty 2

## 2021-04-05 MED ORDER — SODIUM CHLORIDE 0.9 % IV BOLUS
1000.0000 mL | Freq: Once | INTRAVENOUS | Status: AC
  Administered 2021-04-05: 1000 mL via INTRAVENOUS

## 2021-04-05 MED ORDER — PANTOPRAZOLE SODIUM 40 MG PO TBEC
40.0000 mg | DELAYED_RELEASE_TABLET | Freq: Every day | ORAL | Status: DC
  Administered 2021-04-06 – 2021-04-13 (×8): 40 mg via ORAL
  Filled 2021-04-05 (×8): qty 1

## 2021-04-05 NOTE — Telephone Encounter (Signed)
[facility] Patient Teacher, English as a foreign language completed.    The patient is insured through [employer] and has a 0.00 copay.  Nuzyra & Sivextro are both covered under the patient insurance.   We will continue to follow to see if copay assistance is needed.  Laura Roberts, [facility] Patient [facility] Phone: [phone] Fax:  [phone]

## 2021-04-05 NOTE — H&P (Signed)
History and Physical    Laura Roberts NAT:[id number] DOB: 05-Jan-1941 DOA: 04/05/2021  PCP: [name], MD   Chief Complaint: Sent from ID clinic for Plastic Surgery consultation   HPI: Laura Roberts is a 80 y.o. F with hx MS, paraplegic/bedbound with SP catheter for neurogenic bladder, on Tecfidera, IDDM, and multinodular goiter on Tapazole who presented from ID clinic for surgery consultation.  Patient noted by MD at [facility] to have leukocytosis over a month ago.  Extensive work up done there, ultimately, source thought to be her chronic stage IV sacral decubitus ulcer, the patient astutely requested imaging, had an MRI sacrum that showed erosion of her sacrum adjacent to her large ulcer, as well as a 3cm abscess.  She was referred urgently to ID who recommended direct admission to the hospital for surgical debridement and deep culture.  WBC 16.9K, HR and BP at baseline.  Patient asymptomatic, without constitutional symptoms.        Review of Systems: Review of Systems  Constitutional:  Negative for chills, diaphoresis, fever, malaise/fatigue and weight loss.  All other systems reviewed and are negative.     Allergies  Allergen Reactions   Codeine Other (See Comments)    Difficult breathing and skin problem   Ultram [Tramadol] Other (See Comments)    Difficult breathing and skin peeling   Januvia [Sitagliptin] Rash and Other (See Comments)    Blisters    Past Medical History:  Diagnosis Date   Age related osteoporosis    Aphasia    Bronchitis    hx of    Cellulitis and abscess of toe    hx of    Closed supracondylar fracture of right femur, periprosthetic 06/01/2014   Constipation    Cystostomy in place [facility])    Decubitus ulcer    Degenerative arthritis    osteoarthritis    Depression    Diabetes mellitus without complication (HCC)    type 2    Edema    localized    Full incontinence of feces    Gait disturbance    GERD (gastroesophageal reflux  disease)    Gout    hx of    History of falling    Hyperlipidemia    Hypertension    Hyperthyroidism    Multiple sclerosis (HCC)    Neurogenic bowel    Neuromuscular disorder (HCC)    Bilateral hand carpal tunnel syndrome   Neuromuscular dysfunction of bladder    Nontoxic multinodular goiter    Obesity    Osteomyelitis of coccyx ([place]) 04/05/2021   Osteoporosis    Other adrenocortical insufficiency (HCC)    Paraplegia (HCC)    LEGS   Personal history of urinary (tract) infections    Polyneuropathy    Pressure ulcer of right buttock, stage 4 (HCC)    Sacral osteomyelitis ([place]) 04/05/2021   Spinal stenosis in cervical region    Spinal stenosis of lumbosacral region    Spinal stenosis, thoracic    Tinea unguium    Vitamin D deficiency     Past Surgical History:  Procedure Laterality Date   ABDOMINAL HYSTERECTOMY     BACK SURGERY     BOTOX INJECTION N/A 01/30/2018   Procedure: CYSTOSCOPY BOTOX INJECTION, SUPRAPUBIC EXCHANGE;  Surgeon: Lucas Mallow, MD;  Location: [facility];  Service: Urology;  Laterality: N/A;   BOTOX INJECTION N/A 09/02/2018   Procedure: BOTOX INJECTION;  Surgeon: Lucas Mallow, MD;  Location: [facility];  Service: Urology;  Laterality: N/A;   BOTOX INJECTION N/A 05/10/2019   Procedure: CYSTOSCOPY BOTOX INJECTION, SUPRAPUBIC EXCHANGE;  Surgeon: Lucas Mallow, MD;  Location: [facility];  Service: Urology;  Laterality: N/A;   BOTOX INJECTION N/A 09/06/2019   Procedure: CYSTOSCOPY BOTOX INJECTION;  Surgeon: Lucas Mallow, MD;  [facility]: [facility];  Service: Urology;  Laterality: N/A;   BOTOX INJECTION N/A 04/10/2020   Procedure: CYSTOSCOPY BOTOX INJECTION;  Surgeon: Festus Aloe, MD;  Location: [facility];  Service: Urology;  Laterality: N/A;   BOTOX INJECTION N/A 09/13/2020   Procedure: CYSTOSCOPYBOTOX INJECTION SUPRAPUBIC TUBE UPSIZE AND EXCHANGE;  Surgeon: Lucas Mallow, MD;  [facility];  Service: Urology;  Laterality: N/A;   BOTOX INJECTION N/A  03/07/2021   Procedure: CYSTOSCOPY BOTOX INJECTION AND SUPRAPUBIC TUBE EXCHANGE;  Surgeon: Lucas Mallow, MD;  Location: [facility];  Service: Urology;  Laterality: N/A;  REQUESTING 66 MINS FOR CASE   CATARACT EXTRACTION Right    CHOLECYSTECTOMY     CYSTOSCOPY N/A 09/02/2018   Procedure: CYSTOSCOPY;  Surgeon: Lucas Mallow, MD;  [facility]: [facility];  Service: Urology;  Laterality: N/A;   CYSTOSTOMY N/A 09/02/2018   Procedure: CYSTOSTOMY SUPRAPUBIC;  Surgeon: Lucas Mallow, MD;  Location: [facility];  Service: Urology;  Laterality: N/A;  45 MINS   FEMUR IM NAIL Right 06/02/2014   Procedure: INTRAMEDULLARY (IM) RETROGRADE FEMORAL NAILING;  Surgeon: Johnny Bridge, MD;  Location: [facility];  Service: Orthopedics;  Laterality: Right;   GROIN DISSECTION Left 10/24/2017   Procedure: IRRIGATION AND DEBRIDEMENT, LEFT THIGH ABCESS ;  Surgeon: Alphonsa Overall, MD;  Location[facility];  Service: General;  Laterality: Left;   INSERTION OF SUPRAPUBIC CATHETER N/A 09/06/2019   Procedure: SUPRAPUBIC TUBE CHANGE;  Surgeon: Lucas Mallow, MD;  [facility];  Service: Urology;  Laterality: N/A;   INSERTION OF SUPRAPUBIC CATHETER N/A 04/10/2020   Procedure: SUPRAPUBIC CATHETER EXCHANGE/TRACT  DILATION;  Surgeon: Festus Aloe, MD;  [facility];  Service: Urology;  Laterality: N/A;   IR CATHETER TUBE CHANGE  10/13/2017   IR CATHETER TUBE CHANGE  07/15/2018   IR CATHETER TUBE CHANGE  08/26/2019   IR CATHETER TUBE CHANGE  11/25/2019   IR CATHETER TUBE CHANGE  01/06/2020   IR CATHETER TUBE CHANGE  06/13/2020   IR CATHETER TUBE CHANGE  08/01/2020   left hand carpal tunnel surgery     REPLACEMENT TOTAL KNEE BILATERAL  2004     reports that she has never smoked. She has never used smokeless tobacco. She reports that she does not drink alcohol and does not use drugs.  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  Has 2 sons  who are in frequent contact but do not live locally.      Family History  Problem Relation Age of Onset   Multiple sclerosis Other        neices.    Cancer Mother    Diabetes Mother    GI Bleed Sister        diverticulitis   Thyroid disease Neg Hx     Prior to Admission medications   Medication Sig Start Date End Date Taking? Authorizing Provider  acetaminophen (TYLENOL) 325 MG tablet Take 650 mg by mouth every 6 (six) hours as needed for mild pain or fever.     [provider]  ALPRAZolam Duanne Moron)  0.5 MG tablet Take 2 tablets approximately 45 minutes prior to the MRI study, take a third tablet if needed. 02/20/21   Kathrynn Ducking, MD  Amino Acids-Protein Hydrolys (FEEDING SUPPLEMENT, PRO-STAT SUGAR FREE 64,) LIQD Take 30 mLs by mouth daily.    [provider]  baclofen (LIORESAL) 10 MG tablet Take 10-15 mg by mouth daily. (0800 & 1200) Take 1.5 tablets (15 mg) by mouth in the morning & take 1 tablet (10 mg) by mouth at noon.    [provider]  baclofen (LIORESAL) 20 MG tablet Take 20 mg by mouth at bedtime. (2000)    [provider]  barrier cream (NON-SPECIFIED) CREA Apply 1 application topically 2 (two) times daily. (0700 & 1900) Apply to right ischium and bilateral buttocks    [provider]  bisacodyl (DULCOLAX) 10 MG suppository Place 10 mg rectally daily as needed for moderate constipation.    [provider]  calcium citrate (CALCITRATE - DOSED IN MG ELEMENTAL CALCIUM) 950 (200 Ca) MG tablet Take 200 mg of elemental calcium by mouth daily.    [provider]  carboxymethylcellul-glycerin (OPTIVE) 0.5-0.9 % ophthalmic solution Place 1 drop into both eyes in the morning, at noon, in the evening, and at bedtime. (0800,1200, 1600 & 2000)    [provider]  cholecalciferol (VITAMIN D) 25 MCG (1000 UNIT) tablet Take 1,000 Units by mouth daily.    [provider]  cycloSPORINE (RESTASIS) 0.05 %  ophthalmic emulsion Place 1 drop into both eyes 2 (two) times daily. (0800 & 2000)    [provider]  Dulaglutide (TRULICITY) 3 KY/7.0WC SOPN Inject 3 mg into the skin every Friday. (0800)    [provider]  DULoxetine (CYMBALTA) 60 MG capsule Take 1 capsule (60 mg total) by mouth 2 (two) times daily. (0800)Give along with 30 mg to = 90 mg Patient taking differently: Take 60 mg by mouth 2 (two) times daily. (1000 & 1900) 02/20/21   Kathrynn Ducking, MD  furosemide (LASIX) 20 MG tablet Take 20 mg by mouth in the morning.    [provider]  gabapentin (NEURONTIN) 100 MG capsule Take 200 mg by mouth 3 (three) times daily. (0800, 1400 & 2000)    [provider]  guaiFENesin (MUCINEX) 600 MG 12 hr tablet Take 1,200 mg by mouth at bedtime. (2000)    [provider]  insulin glargine (LANTUS) 100 UNIT/ML Solostar Pen Inject 15 Units into the skin at bedtime. (2000)    [provider]  loperamide (IMODIUM) 2 MG capsule Take 2 mg by mouth as needed for diarrhea or loose stools (SUBSEQUENT DOSE GIVE ONE TAB AFTER EACH STOOL (max 8 mg/24 hrs.)).    [provider]  magnesium hydroxide (MILK OF MAGNESIA) 400 MG/5ML suspension Take 30 mLs by mouth daily as needed for mild constipation.    [provider]  methimazole (TAPAZOLE) 5 MG tablet Take 1 tablet (5 mg total) by mouth daily. Patient taking differently: Take 5 mg by mouth daily at 6 (six) AM. (0600) 11/11/17   Renato Shin, MD  metoprolol succinate (TOPROL-XL) 50 MG 24 hr tablet Take 50 mg by mouth daily. (0800)    [provider]  Multiple Vitamin (MULTIVITAMIN WITH MINERALS) TABS tablet Take 1 tablet by mouth daily. (0800)    [provider]  ondansetron (ZOFRAN) 4 MG tablet Take 4 mg by mouth every 6 (six) hours as needed for nausea or vomiting.    [provider]  pantoprazole (PROTONIX) 40 MG tablet Take 40 mg by mouth daily at 6 (six) AM. (0600)     [provider]  potassium chloride SA (KLOR-CON) 20 MEQ tablet Take 20 mEq by mouth 2 (two) times a week. Mondays & Fridays    [provider]  predniSONE (DELTASONE) 2.5 MG tablet Take 2.5 mg by mouth every other day. (0800) Alternating days with 5 mg dose    [provider]  predniSONE (DELTASONE) 5 MG tablet Take 5 mg by mouth every other day. (0800) Alternating days with 2.5 mg dose    [provider]  SF 5000 PLUS 1.1 % CREA dental cream Take 1 application by mouth daily at 8 pm. (2000) 12/13/20   [provider]  Sodium Phosphates (RA ENEMA) 7-19 GM/118ML ENEM Place 118 mLs rectally daily as needed (severe constipation).    [provider]  solifenacin (VESICARE) 10 MG tablet Take 10 mg by mouth daily. (0800)    [provider]  TECFIDERA 240 MG CPDR Take 1 capsule (240 mg total) by mouth 2 (two) times daily. (0800 & 2000) 01/25/21   Kathrynn Ducking, MD  vitamin C (ASCORBIC ACID) 500 MG tablet Take 500 mg by mouth in the morning and at bedtime.    [provider]  zinc sulfate 220 (50 Zn) MG capsule Take 220 mg by mouth daily.    [provider]    Physical Exam: Vitals:   04/05/21 1430 04/05/21 1530 04/05/21 1643 04/05/21 1700  BP: 104/74 112/69 107/60 111/67  Pulse: (!) 101  (!) 111 (!) 112  Resp: 19 17 17 18   Temp:      TempSrc:      SpO2: 93%  93% 93%   General appearance: Elderly adult female, obese, lying in bed, no acute distress, interactive     HEENT: Anicteric, conjunctival pink, lids and lashes normal.  No nasal deformity, discharge, or epistaxis.  Dentition in good repair, oropharynx dry, no oral lesions, hearing normal. Skin: Skin warm and dry, no suspicious rashes or lesions on the face, upper chest, arms, legs.  Wound as pictured.   Cardiac: Tachycardic, regular, no murmurs, 1+ bilateral lower extremity edema, JVP not visible Respiratory: Respiratory rate and rhythm, lungs clear without  rales or wheezes Abdomen: Abdomen soft without tenderness palpation or guarding, no ascites or distention, no hepatosplenomegaly MSK: Contractures of both upper extremities hands, lower extremities are paraplegic.  There are no obvious deformities. Neuro: Awake and alert, extraocular movements intact, face symmetric, speech fluent, phonation normal, upper extremity strength is 4/5 but symmetric, hands have some mild contractures, both legs are numb completely and flaccid Psych: Attention normal, affect appropriate, judgment and insight appear normal.     Labs on Admission: I have personally reviewed the patients's labs and imaging studies which are summarized above.  Assessment/Plan Principal Problem:   Chronic osteomyelitis ([place]) This has been going on some time.  The patient had a good judgment vascular imaging from her PCP at [facility], an MRI recently showed bony erosion, chronic osteomyelitis, and a 3 cm abscess.  Appreciate general surgery assistance for deep culture and debridement.  I see she meets SIRS criteria, but clinically at present, the patient is not septic, nor hemodynamically unstable or symptomatic.  - Consult General Surgery for deep culture to guide antibiotic therapy - Hold antibiotics for now  - Consult ID       Chronic respiratory failure ([place]) Patient's bicarb is 35, high chance this  is chronic hypercarbic respiratory failure due to neuromuscular disease due to multiple sclerosis.  This increases her risks of acute respiratory failure postanesthesia. - Check VBG in AM     Multiple sclerosis (HCC) -Continue home prednisone and Tecfidera     Hypokalemia -Supplement potassium -Check magnesium     Essential hypertension Blood pressure normal - Continue Lasix, metoprolol     Type 2 diabetes mellitus with diabetic neuropathy, with long-term current use of insulin (HCC) Glucose here is well controlled - Continue home Lantus - Hold home Trulicity -  Start sliding scale corrections while in the hospital   Multinodular goiter Notes suggest that she has a multinodular goiter followed by Dr. Loanne Drilling, for which he treats her with Tapazole. - Continue Tapazole   Anemia in other chronic diseases classified elsewhere Hemoglobin stable relative to baseline   Paraplegia (HCC) Due to multiple sclerosis, with SP catheter in place - Continue baclofen, duloxetine, gabapentin     Chronic suprapubic catheter ([place])  - Continue Vesicare      Admission status: Inpatient Med-Surg  Certification: The appropriate patient status for this patient is INPATIENT. Inpatient status is judged to be reasonable and necessary in order to provide the required intensity of service to ensure the patient's safety. The patient's presenting symptoms, physical exam findings, and initial radiographic and laboratory data in the context of their chronic comorbidities is felt to place them at high risk for further clinical deterioration. Furthermore, it is not anticipated that the patient will be medically stable for discharge from the hospital within 2 midnights of admission. The following factors support the patient status of inpatient.   " The patient's presenting symptoms include longstanding ulcer in the sacrum, with worsening drainage " The worrisome physical exam findings include stage IV ulcer with drainage, tachycardia. " The initial radiographic and laboratory data are worrisome because of MRI showing osteomyelitis, 3 cm abscess, also anemia, leukocytosis. " The chronic co-morbidities include multiple sclerosis, paraplegia, diabetes, neurogenic bladder, hypothyroidism.   * I certify that at the point of admission it is my clinical judgment that the patient will require inpatient hospital care spanning beyond 2 midnights from the point of admission due to high intensity of service, high risk for further deterioration and high frequency of surveillance  required.Edwin Dada MD [facility] If 7PM-7AM, please contact night-coverage [email or web address]  04/05/2021, 7:19 PM

## 2021-04-05 NOTE — Progress Notes (Signed)
Subjective:  Reason for infectious disease consult: Stage IV decubitus ulcer with sacrococcygeal osteomyelitis and adjacent abscess   Requesting physician: Darrick Penna. Linna Darner, MD   Patient ID: Laura Roberts, female    DOB: 17-Jun-1941, 80 y.o.   MRN: [id number]  HPI  Laura Roberts is a very was not 80 year old black woman with a history of MS for more than a decade and a half who is unfortunately paraplegic and bedbound, with suprapubic catheter type 2 diabetes requiring insulin who has developed a large stage IV decubitus ulcer.  This now has had recent L smelling drainage from the wound.  She had an MRI of the sacral area performed March 23, 2021 which shows her deep ulcer with extension to the bone and evidence of acute on chronic osteomyelitis involving the sacrum and coccyx there is also a 3 cm abscess superior to the wound on the right of the midline.  She has had a PICC line placed at skilled nursing facility and started on IV vancomycin.  We do not have any culture data to guide [place] yet.  Does endorse some nausea "from the medications that they are giving me" she denies fevers or chills or malaise.  She believes that wound has been there for a few weeks though clearly has been there for much longer.   Past Medical History:  Diagnosis Date   Age related osteoporosis    Aphasia    Bronchitis    hx of    Cellulitis and abscess of toe    hx of    Constipation    Cystostomy in place [facility])    Decubitus ulcer    Degenerative arthritis    osteoarthritis    Depression    Diabetes mellitus without complication (HCC)    type 2    Edema    localized    Full incontinence of feces    Gait disturbance    GERD (gastroesophageal reflux disease)    Gout    hx of    History of falling    Hyperlipidemia    Hypertension    Hyperthyroidism    Multiple sclerosis (HCC)    Neurogenic bowel    Neuromuscular disorder (HCC)    Bilateral hand carpal tunnel syndrome   Neuromuscular  dysfunction of bladder    Nontoxic multinodular goiter    Obesity    Osteomyelitis of coccyx ([place]) 04/05/2021   Osteoporosis    Other adrenocortical insufficiency (HCC)    Paraplegia (HCC)    LEGS   Personal history of urinary (tract) infections    Polyneuropathy    Pressure ulcer of right buttock, stage 4 (HCC)    Sacral osteomyelitis ([place]) 04/05/2021   Spinal stenosis in cervical region    Spinal stenosis of lumbosacral region    Spinal stenosis, thoracic    Tinea unguium    Vitamin D deficiency     Past Surgical History:  Procedure Laterality Date   ABDOMINAL HYSTERECTOMY     BACK SURGERY     BOTOX INJECTION N/A 01/30/2018   Procedure: CYSTOSCOPY BOTOX INJECTION, SUPRAPUBIC EXCHANGE;  Surgeon: Lucas Mallow, MD;  Location: [facility];  Service: Urology;  Laterality: N/A;   BOTOX INJECTION N/A 09/02/2018   Procedure: BOTOX INJECTION;  Surgeon: Lucas Mallow, MD;  Location: [facility];  Service: Urology;  Laterality: N/A;   BOTOX INJECTION N/A 05/10/2019   Procedure: CYSTOSCOPY BOTOX INJECTION, SUPRAPUBIC EXCHANGE;  Surgeon: Lucas Mallow, MD;  Location[facility];  Service: Urology;  Laterality: N/A;   BOTOX INJECTION N/A 09/06/2019   Procedure: CYSTOSCOPY BOTOX INJECTION;  Surgeon: Lucas Mallow, MD;  [facility];  Service: Urology;  Laterality: N/A;   BOTOX INJECTION N/A 04/10/2020   Procedure: CYSTOSCOPY BOTOX INJECTION;  Surgeon: Festus Aloe, MD;  Location: [facility];  Service: Urology;  Laterality: N/A;   BOTOX INJECTION N/A 09/13/2020   Procedure: CYSTOSCOPYBOTOX INJECTION SUPRAPUBIC TUBE UPSIZE AND EXCHANGE;  Surgeon: Lucas Mallow, MD;  [facility];  Service: Urology;  Laterality: N/A;   BOTOX INJECTION N/A 03/07/2021   Procedure: CYSTOSCOPY BOTOX INJECTION AND SUPRAPUBIC TUBE EXCHANGE;  Surgeon: Lucas Mallow, MD;  Location: [facility];  Service: Urology;  Laterality: N/A;  REQUESTING 54 MINS FOR CASE   CATARACT EXTRACTION Right    CHOLECYSTECTOMY      CYSTOSCOPY N/A 09/02/2018   Procedure: CYSTOSCOPY;  Surgeon: Lucas Mallow, MD;  Location: [facility];  Service: Urology;  Laterality: N/A;   CYSTOSTOMY N/A 09/02/2018   Procedure: CYSTOSTOMY SUPRAPUBIC;  Surgeon: Lucas Mallow, MD;  Location: [facility];  Service: Urology;  Laterality: N/A;  45 MINS   FEMUR IM NAIL Right 06/02/2014   Procedure: INTRAMEDULLARY (IM) RETROGRADE FEMORAL NAILING;  Surgeon: Johnny Bridge, MD;  Location: [place];  Service: Orthopedics;  Laterality: Right;   GROIN DISSECTION Left 10/24/2017   Procedure: IRRIGATION AND DEBRIDEMENT, LEFT THIGH ABCESS ;  Surgeon: Alphonsa Overall, MD;  Location: [facility];  Service: General;  Laterality: Left;   INSERTION OF SUPRAPUBIC CATHETER N/A 09/06/2019   Procedure: SUPRAPUBIC TUBE CHANGE;  Surgeon: Lucas Mallow, MD;  [facility];  Service: Urology;  Laterality: N/A;   INSERTION OF SUPRAPUBIC CATHETER N/A 04/10/2020   Procedure: SUPRAPUBIC CATHETER EXCHANGE/TRACT  DILATION;  Surgeon: Festus Aloe, MD;  [facility];  Service: Urology;  Laterality: N/A;   IR CATHETER TUBE CHANGE  10/13/2017   IR CATHETER TUBE CHANGE  07/15/2018   IR CATHETER TUBE CHANGE  08/26/2019   IR CATHETER TUBE CHANGE  11/25/2019   IR CATHETER TUBE CHANGE  01/06/2020   IR CATHETER TUBE CHANGE  06/13/2020   IR CATHETER TUBE CHANGE  08/01/2020   left hand carpal tunnel surgery     REPLACEMENT TOTAL KNEE BILATERAL  2004    Family History  Problem Relation Age of Onset   Multiple sclerosis Other        neices.    Cancer Mother    Diabetes Mother    GI Bleed Sister        diverticulitis   Thyroid disease Neg Hx       Social History   Socioeconomic History   Marital status: Widowed    Spouse name: Not on file   Number of children: 2   Years of education: 13yr colleg   Highest education level: Not on file  Occupational History   Occupation: N/A    Employer: [employer]    Comment: [employer]  Tobacco Use   Smoking status: Never    Smokeless tobacco: Never  Vaping Use   Vaping Use: Never used  Substance and Sexual Activity   Alcohol use: No   Drug use: No   Sexual activity: Never    Birth control/protection: Surgical  Other Topics Concern   Not on file  Social History Narrative   Patient is widowed with 2 children   Patient is right handed   Patient has 2  yrs of college   Patient drinks 2-3 cups of tea daily   Social Determinants of Health   Financial Resource Strain: Not on file  Food Insecurity: Not on file  Transportation Needs: Not on file  Physical Activity: Not on file  Stress: Not on file  Social Connections: Not on file    Allergies  Allergen Reactions   Codeine Other (See Comments)    Difficult breathing and skin problem   Ultram [Tramadol] Other (See Comments)    Difficult breathing and skin peeling   Januvia [Sitagliptin] Rash and Other (See Comments)    Blisters     Current Outpatient Medications:    vitamin C (ASCORBIC ACID) 500 MG tablet, Take 500 mg by mouth in the morning and at bedtime., Disp: , Rfl:    acetaminophen (TYLENOL) 325 MG tablet, Take 650 mg by mouth every 6 (six) hours as needed for mild pain or fever. , Disp: , Rfl:    ALPRAZolam (XANAX) 0.5 MG tablet, Take 2 tablets approximately 45 minutes prior to the MRI study, take a third tablet if needed., Disp: 3 tablet, Rfl: 0   Amino Acids-Protein Hydrolys (FEEDING SUPPLEMENT, PRO-STAT SUGAR FREE 64,) LIQD, Take 30 mLs by mouth daily., Disp: , Rfl:    baclofen (LIORESAL) 10 MG tablet, Take 10-15 mg by mouth daily. (0800 & 1200) Take 1.5 tablets (15 mg) by mouth in the morning & take 1 tablet (10 mg) by mouth at noon., Disp: , Rfl:    baclofen (LIORESAL) 20 MG tablet, Take 20 mg by mouth at bedtime. (2000), Disp: , Rfl:    barrier cream (NON-SPECIFIED) CREA, Apply 1 application topically 2 (two) times daily. (0700 & 1900) Apply to right ischium and bilateral buttocks, Disp: , Rfl:    bisacodyl (DULCOLAX) 10 MG suppository,  Place 10 mg rectally daily as needed for moderate constipation., Disp: , Rfl:    calcium citrate (CALCITRATE - DOSED IN MG ELEMENTAL CALCIUM) 950 (200 Ca) MG tablet, Take 200 mg of elemental calcium by mouth daily., Disp: , Rfl:    carboxymethylcellul-glycerin (OPTIVE) 0.5-0.9 % ophthalmic solution, Place 1 drop into both eyes in the morning, at noon, in the evening, and at bedtime. (0800,1200, 1600 & 2000), Disp: , Rfl:    cefTRIAXone 1 g in dextrose 5 % 50 mL, Inject 1 g into the vein daily. (Patient not taking: Reported on 03/28/2021), Disp: , Rfl:    cholecalciferol (VITAMIN D) 25 MCG (1000 UNIT) tablet, Take 1,000 Units by mouth daily., Disp: , Rfl:    cycloSPORINE (RESTASIS) 0.05 % ophthalmic emulsion, Place 1 drop into both eyes 2 (two) times daily. (0800 & 2000), Disp: , Rfl:    Dulaglutide (TRULICITY) 3 OI/7.1IW SOPN, Inject 3 mg into the skin every Friday. (0800), Disp: , Rfl:    DULoxetine (CYMBALTA) 60 MG capsule, Take 1 capsule (60 mg total) by mouth 2 (two) times daily. (0800)Give along with 30 mg to = 90 mg (Patient taking differently: Take 60 mg by mouth 2 (two) times daily. (1000 & 1900)), Disp: 180 capsule, Rfl: 1   furosemide (LASIX) 20 MG tablet, Take 20 mg by mouth in the morning., Disp: , Rfl:    gabapentin (NEURONTIN) 100 MG capsule, Take 200 mg by mouth 3 (three) times daily. (0800, 1400 & 2000), Disp: , Rfl:    guaiFENesin (MUCINEX) 600 MG 12 hr tablet, Take 1,200 mg by mouth at bedtime. (2000), Disp: , Rfl:    insulin glargine (LANTUS) 100 UNIT/ML Solostar Pen,  Inject 15 Units into the skin at bedtime. (2000), Disp: , Rfl:    loperamide (IMODIUM) 2 MG capsule, Take 2 mg by mouth as needed for diarrhea or loose stools (SUBSEQUENT DOSE GIVE ONE TAB AFTER EACH STOOL (max 8 mg/24 hrs.))., Disp: , Rfl:    magnesium hydroxide (MILK OF MAGNESIA) 400 MG/5ML suspension, Take 30 mLs by mouth daily as needed for mild constipation., Disp: , Rfl:    methimazole (TAPAZOLE) 5 MG tablet, Take  1 tablet (5 mg total) by mouth daily. (Patient taking differently: Take 5 mg by mouth daily at 6 (six) AM. (0600)), Disp: 30 tablet, Rfl: 5   metoprolol succinate (TOPROL-XL) 50 MG 24 hr tablet, Take 50 mg by mouth daily. (0800), Disp: , Rfl:    Multiple Vitamin (MULTIVITAMIN WITH MINERALS) TABS tablet, Take 1 tablet by mouth daily. (0800), Disp: , Rfl:    ondansetron (ZOFRAN) 4 MG tablet, Take 4 mg by mouth every 6 (six) hours as needed for nausea or vomiting., Disp: , Rfl:    pantoprazole (PROTONIX) 40 MG tablet, Take 40 mg by mouth daily at 6 (six) AM. (0600), Disp: , Rfl:    potassium chloride SA (KLOR-CON) 20 MEQ tablet, Take 20 mEq by mouth 2 (two) times a week. Mondays & Fridays, Disp: , Rfl:    predniSONE (DELTASONE) 2.5 MG tablet, Take 2.5 mg by mouth every other day. (0800) Alternating days with 5 mg dose, Disp: , Rfl:    predniSONE (DELTASONE) 5 MG tablet, Take 5 mg by mouth every other day. (0800) Alternating days with 2.5 mg dose, Disp: , Rfl:    SF 5000 PLUS 1.1 % CREA dental cream, Take 1 application by mouth daily at 8 pm. (2000), Disp: , Rfl:    Sodium Phosphates (RA ENEMA) 7-19 GM/118ML ENEM, Place 118 mLs rectally daily as needed (severe constipation)., Disp: , Rfl:    solifenacin (VESICARE) 10 MG tablet, Take 10 mg by mouth daily. (0800), Disp: , Rfl:    TECFIDERA 240 MG CPDR, Take 1 capsule (240 mg total) by mouth 2 (two) times daily. (0800 & 2000), Disp: 60 capsule, Rfl: 11   zinc sulfate 220 (50 Zn) MG capsule, Take 220 mg by mouth daily., Disp: , Rfl:    Review of Systems  Constitutional:  Negative for activity change, appetite change, chills, diaphoresis, fatigue, fever and unexpected weight change.  HENT:  Negative for congestion, rhinorrhea, sinus pressure, sneezing, sore throat and trouble swallowing.   Eyes:  Negative for photophobia and visual disturbance.  Respiratory:  Negative for cough, chest tightness, shortness of breath, wheezing and stridor.   Cardiovascular:   Negative for chest pain, palpitations and leg swelling.  Gastrointestinal:  Negative for abdominal distention, abdominal pain, anal bleeding, blood in stool, constipation, diarrhea, nausea and vomiting.  Genitourinary:  Negative for difficulty urinating, dysuria, flank pain and hematuria.  Musculoskeletal:  Negative for arthralgias, back pain, gait problem, joint swelling and myalgias.  Skin:  Negative for color change, pallor, rash and wound.  Neurological:  Negative for dizziness, tremors, weakness and light-headedness.  Hematological:  Negative for adenopathy. Does not bruise/bleed easily.  Psychiatric/Behavioral:  Negative for agitation, behavioral problems, confusion, decreased concentration, dysphoric mood and sleep disturbance.       Objective:   Physical Exam HENT:     Head: Normocephalic and atraumatic.     Nose: Nose normal.  Cardiovascular:     Rate and Rhythm: Normal rate and regular rhythm.  Pulmonary:     Effort: Pulmonary  effort is normal. No respiratory distress.     Breath sounds: No wheezing.  Abdominal:     General: There is no distension.     Palpations: There is no mass.  Skin:    General: Skin is warm and dry.  Neurological:     Mental Status: She is alert and oriented to person, place, and time.     Paraplegia  Decubitus ulcer pictured [place] 04/05/2021:     Bone palpable in wound  PICC line 04/05/2021:         Assessment & Plan:   Sacrococcygeal osteomyelitis in the presence of stage IV decubitus ulcer with adjacent abscess:  I am going to refer the patient to plastic surgery with Dr. Tedra Coupe him in the hopes that she could perform I&D of the abscess and potentially obtained culture from bone to guide therapy  In the interim I would like to have her vancomycin held while continuing maintenance of the PICC line  Hopefully cultures from the abscess plus minus the bone can give [place] some guidance with regards to treatment though we may have to  give her empiric therapy that may include vancomycin though I would like to give [place] a chance to have some culture to give [place] guidance.  I discussed with her the importance of offloading this area.  She says that she has an air mattress but she has not frequently turned.  She cannot turn herself.  I also discussed the option of diversion of stool via diverting colostomy to protect the site as it is in danger of becoming fecally contaminated.  I spent 882minutes with the patient including than 50% of the time in face to face counseling of the patient the nature of sacral osteomyelitis due to decubitus ulcers and pressure wounds in the context of paraplegia, multidisciplinary care that is needed including surgical wound care antibiotics nutrition, personally reviewing MRI of the sacral area performed on March 23, 2021: CBC BMPalong with review of medical records in preparation for the visit and during the visit and in coordination of her care.

## 2021-04-05 NOTE — Telephone Encounter (Addendum)
Cassandra with [facility] called office stating pt is being transported to [facility] ED. Dr. Tommy Medal updated.   Leatrice Jewels, RMA

## 2021-04-05 NOTE — Consult Note (Signed)
[facility] for Infectious Disease    Date of Admission:  04/05/2021     Reason for Consult: sacral decub associated soft tissue infection/abscess and osteomyelitis    Referring Provider: triad hospitalist   Lines:  Outpatient placed PICC Suprapubic cath  Abx: None  Outpatient vanc ?since after 9/23, about 2 weeks per her report       Assessment: 80 yo female with long standing MS, paraplegic/bedbound, neurogenic bladder s/p chronic suprapubic cath, dm2, chronic sacral decub referred from ID clinic for admission for further diagnostic w/u of the sacral ulcer associated soft tissue/bone infection  Dr Tommy Medal had evaluated in clinic. He would like to have plastic or surgery evaluation for I&D with bone/deep tissue biopsy to guide targeted therapy. This is appropriate given her being a nursing home patient and at risk for mdro. And generally chronic OM does better with I&D of necrotic bone/tissue. Ideally also would benefit from colonic diversion/flap coverage toward end of abx treatment although not sure it would be an option for this patient   Her spc is changed weekly, and is about due for anther change    Plan: Continue to hold abx Please discuss case with plastic or general surgery regarding I&D and bone/deep tissue culture Please change her SPC sometimes this week Will follow  I spent 60 minute reviewing data/chart, and coordinating care and >50% direct face to face time providing counseling/discussing diagnostics/treatment plan with patient      ------------------------------------------------  HPI: Laura Roberts is a 80 y.o. female MS, dm2, chronic decub ulcer, suprapubic cath, referred from ID clinic for surgical intervention/diagnosis of sacral ulcer associated soft tissue/osseous infection  Patient was seen in the ID clinic by my partner Dr Drucilla Schmidt.  Please refer to his note today for detail  Patient had recent sacral mri that showed OM and  soft tissue abscess. She has a picc placed at the nursing home and vancomycin was started  There is no culture data  Dr Drucilla Schmidt would like her abx hold given no sepsis, to optimize culture yield. He wants her to be admitted for surgical intervention in terms of I&D and bone/deep tissue culture  Patient without f/c Her ms is quiescent    Family History  Problem Relation Age of Onset   Multiple sclerosis Other        neices.    Cancer Mother    Diabetes Mother    GI Bleed Sister        diverticulitis   Thyroid disease Neg Hx     Social History   Tobacco Use   Smoking status: Never   Smokeless tobacco: Never  Vaping Use   Vaping Use: Never used  Substance Use Topics   Alcohol use: No   Drug use: No    Allergies  Allergen Reactions   Codeine Other (See Comments)    Difficult breathing and skin problem   Ultram [Tramadol] Other (See Comments)    Difficult breathing and skin peeling   Januvia [Sitagliptin] Rash and Other (See Comments)    Blisters    Review of Systems: ROS All Other ROS was negative, except mentioned above   Past Medical History:  Diagnosis Date   Age related osteoporosis    Aphasia    Bronchitis    hx of    Cellulitis and abscess of toe    hx of    Constipation    Cystostomy in place [facility])  Decubitus ulcer    Degenerative arthritis    osteoarthritis    Depression    Diabetes mellitus without complication (HCC)    type 2    Edema    localized    Full incontinence of feces    Gait disturbance    GERD (gastroesophageal reflux disease)    Gout    hx of    History of falling    Hyperlipidemia    Hypertension    Hyperthyroidism    Multiple sclerosis (HCC)    Neurogenic bowel    Neuromuscular disorder (HCC)    Bilateral hand carpal tunnel syndrome   Neuromuscular dysfunction of bladder    Nontoxic multinodular goiter    Obesity    Osteomyelitis of coccyx ([place]) 04/05/2021   Osteoporosis    Other adrenocortical insufficiency  (HCC)    Paraplegia (HCC)    LEGS   Personal history of urinary (tract) infections    Polyneuropathy    Pressure ulcer of right buttock, stage 4 (HCC)    Sacral osteomyelitis ([place]) 04/05/2021   Spinal stenosis in cervical region    Spinal stenosis of lumbosacral region    Spinal stenosis, thoracic    Tinea unguium    Vitamin D deficiency        Scheduled Meds: Continuous Infusions:  sodium chloride     PRN Meds:.   OBJECTIVE: Blood pressure 96/82, pulse (!) 111, temperature 98.9 F (37.2 C), temperature source Oral, resp. rate 18, SpO2 91 %.  Physical Exam General/constitutional: no distress, pleasant HEENT: Normocephalic, PER, Conj Clear, EOMI, Oropharynx clear Neck supple CV: rrr no mrg Lungs: clear to auscultation, normal respiratory effort Abd: Soft, Nontender; spc site without purulence/discharge/erythema Ext: no edema Skin: No Rash. Refer to pictures today for detail of the ulcer Neuro: paraplegic Gu: spc in place MSK: no peripheral joint swelling/tenderness/warmth; back spines nontender        Lab Results Lab Results  Component Value Date   WBC 20.8 (H) 03/07/2021   HGB 10.6 (L) 03/07/2021   HCT 31.9 (L) 03/07/2021   MCV 86.0 03/07/2021   PLT 386 03/07/2021    Lab Results  Component Value Date   CREATININE 0.47 03/07/2021   BUN <5 (L) 03/07/2021   NA 138 03/07/2021   K 2.8 (L) 03/07/2021   CL 96 (L) 03/07/2021   CO2 28 03/07/2021    Lab Results  Component Value Date   ALT 15 12/07/2019   AST 11 12/07/2019   ALKPHOS 74 12/07/2019   BILITOT 0.3 12/07/2019      Microbiology: No results found for this or any previous visit (from the past 240 hour(s)).   Serology:    Imaging: If present, new imagings (plain films, ct scans, and mri) have been personally visualized and interpreted; radiology reports have been reviewed. Decision making incorporated into the Impression / Recommendations.  9/23 mri sacrum/sijoint 1. Deep midline  sacral decubitus ulcer extending to bone with underlying acute on chronic osteomyelitis of the inferior sacrum and coccyx. 2. 3.0 cm abscess just right of midline at the level of the upper sacrum, superior to the decubitus ulcer.  Jabier Mutton, [place] for Infectious [place] [phone] pager    04/05/2021, 1:08 PM

## 2021-04-05 NOTE — ED Triage Notes (Signed)
Pt bib [facility] from [facility] d/t stage IV sacral ulcer. Pt seen by wound care center today and sent back to [facility].  Per EMS pt was to be a direct admission for a plastic surgery referral.  Pt believes she is here for a possible biopsy.  No new concerns at this time.

## 2021-04-05 NOTE — ED Provider Notes (Signed)
[place] DEPT Provider Note   CSN: [id number] Arrival date & time: 04/05/21  1223     History No chief complaint on file.   Laura Roberts is a 80 y.o. female.  She has a history of MS and has paraplegia.  Has a suprapubic catheter and a PICC line in her right upper arm.  Has been at [facility] getting wound care and IV antibiotics for sacral osteomyelitis.  It sounds as though there is a new fluid collection and this is not able to be taken care of as an outpatient.  She saw infectious disease today Dr. Tommy Medal who recommended the patient come to the emergency department for admission and possible general surgery versus plastics consult for management of this.  He did not recommend any antibiotics at this time.  Patient endorses some nausea no vomiting.  Has chronic diarrhea.  No fevers or chills.  No chest pain or shortness of breath.  She is bedbound.  The history is provided by the patient and the EMS personnel.  Wound Check This is a chronic problem. Episode onset: months. The problem has not changed since onset.Pertinent negatives include no chest pain, no abdominal pain, no headaches and no shortness of breath. Exacerbated by: pressure. Nothing relieves the symptoms. She has tried rest for the symptoms. The treatment provided no relief.      Past Medical History:  Diagnosis Date   Age related osteoporosis    Aphasia    Bronchitis    hx of    Cellulitis and abscess of toe    hx of    Constipation    Cystostomy in place [facility])    Decubitus ulcer    Degenerative arthritis    osteoarthritis    Depression    Diabetes mellitus without complication (HCC)    type 2    Edema    localized    Full incontinence of feces    Gait disturbance    GERD (gastroesophageal reflux disease)    Gout    hx of    History of falling    Hyperlipidemia    Hypertension    Hyperthyroidism    Multiple sclerosis (HCC)    Neurogenic bowel    Neuromuscular  disorder (HCC)    Bilateral hand carpal tunnel syndrome   Neuromuscular dysfunction of bladder    Nontoxic multinodular goiter    Obesity    Osteomyelitis of coccyx ([place]) 04/05/2021   Osteoporosis    Other adrenocortical insufficiency (HCC)    Paraplegia (HCC)    LEGS   Personal history of urinary (tract) infections    Polyneuropathy    Pressure ulcer of right buttock, stage 4 (HCC)    Sacral osteomyelitis ([place]) 04/05/2021   Spinal stenosis in cervical region    Spinal stenosis of lumbosacral region    Spinal stenosis, thoracic    Tinea unguium    Vitamin D deficiency     Patient Active Problem List   Diagnosis Date Noted   Sacral osteomyelitis ([place]) 04/05/2021   Stage 4 decubitus ulcer ([place]) 04/05/2021   Osteomyelitis of coccyx ([place]) 04/05/2021   Abscess 04/05/2021   Diarrhea of presumed infectious origin 03/13/2021   Protein-calorie malnutrition ([place]) 08/01/2020   Pruritus of scalp 04/13/2020   Essential (hemorrhagic) thrombocythemia ([place]) 10/28/2019   Recurrent depression ([place]) 01/14/2019   Decubitus ulcer 01/31/2018   Hypoxia 01/30/2018   Hyperthyroidism 01/14/2018   Suprapubic catheter ([place]) 10/29/2017   Inguinal abscess 10/24/2017   Hypokalemia  Type 2 diabetes mellitus with diabetic neuropathy, with long-term current use of insulin (HCC)    Hypomagnesemia    UTI (urinary tract infection) 09/60/4540   Complicated UTI (urinary tract infection) 10/09/2017   UTI (urinary tract infection) due to urinary indwelling Foley catheter ([place]) 08/13/2017   Sepsis ([place]) 11/22/2016   Leukocytosis    Paraplegia ([place]) 06/01/2014   Closed supracondylar fracture of right femur, periprosthetic 06/01/2014   Femoral fracture ([place]) 06/01/2014   Encounter for therapeutic drug monitoring 01/05/2014   Foot drop, bilateral 10/13/2012   Neurogenic bladder 09/02/2012   Neurogenic pain of lower extremity 09/02/2012   HTN (hypertension) 07/21/2012   HLD (hyperlipidemia) 07/21/2012   GERD  (gastroesophageal reflux disease) 07/21/2012   Multiple sclerosis ([place])     Past Surgical History:  Procedure Laterality Date   ABDOMINAL HYSTERECTOMY     BACK SURGERY     BOTOX INJECTION N/A 01/30/2018   Procedure: CYSTOSCOPY BOTOX INJECTION, SUPRAPUBIC EXCHANGE;  Surgeon: Lucas Mallow, MD;  Location: [facility];  Service: Urology;  Laterality: N/A;   BOTOX INJECTION N/A 09/02/2018   Procedure: BOTOX INJECTION;  Surgeon: Lucas Mallow, MD;  Location: [facility];  Service: Urology;  Laterality: N/A;   BOTOX INJECTION N/A 05/10/2019   Procedure: CYSTOSCOPY BOTOX INJECTION, SUPRAPUBIC EXCHANGE;  Surgeon: Lucas Mallow, MD;  Location: [facility];  Service: Urology;  Laterality: N/A;   BOTOX INJECTION N/A 09/06/2019   Procedure: CYSTOSCOPY BOTOX INJECTION;  Surgeon: Lucas Mallow, MD;  [facility];  Service: Urology;  Laterality: N/A;   BOTOX INJECTION N/A 04/10/2020   Procedure: CYSTOSCOPY BOTOX INJECTION;  Surgeon: Festus Aloe, MD;  [facility]: [facility];  Service: Urology;  Laterality: N/A;   BOTOX INJECTION N/A 09/13/2020   Procedure: CYSTOSCOPYBOTOX INJECTION SUPRAPUBIC TUBE UPSIZE AND EXCHANGE;  Surgeon: Lucas Mallow, MD;  [facility];  Service: Urology;  Laterality: N/A;   BOTOX INJECTION N/A 03/07/2021   Procedure: CYSTOSCOPY BOTOX INJECTION AND SUPRAPUBIC TUBE EXCHANGE;  Surgeon: Lucas Mallow, MD;  [facility]: [facility];  Service: Urology;  Laterality: N/A;  REQUESTING 44 MINS FOR CASE   CATARACT EXTRACTION Right    CHOLECYSTECTOMY     CYSTOSCOPY N/A 09/02/2018   Procedure: CYSTOSCOPY;  Surgeon: Lucas Mallow, MD;  [facility]: [facility];  Service: Urology;  Laterality: N/A;   CYSTOSTOMY N/A 09/02/2018   Procedure: CYSTOSTOMY SUPRAPUBIC;  Surgeon: Lucas Mallow, MD;  Location: [facility];  Service: Urology;  Laterality: N/A;  45 MINS   FEMUR IM NAIL Right 06/02/2014   Procedure: INTRAMEDULLARY (IM) RETROGRADE FEMORAL NAILING;  Surgeon: Johnny Bridge, MD;  Location: [place];  Service: Orthopedics;  Laterality: Right;   GROIN DISSECTION Left 10/24/2017   Procedure: IRRIGATION AND DEBRIDEMENT, LEFT THIGH ABCESS ;  Surgeon: Alphonsa Overall, MD;  Location[facility];  Service: General;  Laterality: Left;   INSERTION OF SUPRAPUBIC CATHETER N/A 09/06/2019   Procedure: SUPRAPUBIC TUBE CHANGE;  Surgeon: Lucas Mallow, MD;  [facility];  Service: Urology;  Laterality: N/A;   INSERTION OF SUPRAPUBIC CATHETER N/A 04/10/2020   Procedure: SUPRAPUBIC CATHETER EXCHANGE/TRACT  DILATION;  Surgeon: Festus Aloe, MD;  [facility];  Service: Urology;  Laterality: N/A;   IR CATHETER TUBE CHANGE  10/13/2017   IR CATHETER TUBE CHANGE  07/15/2018   IR CATHETER TUBE CHANGE  08/26/2019   IR CATHETER TUBE CHANGE  11/25/2019   IR CATHETER TUBE CHANGE  01/06/2020   IR CATHETER TUBE CHANGE  06/13/2020   IR CATHETER TUBE CHANGE  08/01/2020   left hand carpal tunnel surgery     REPLACEMENT TOTAL KNEE BILATERAL  2004     OB History   No obstetric history on file.     Family History  Problem Relation Age of Onset   Multiple sclerosis Other        neices.    Cancer Mother    Diabetes Mother    GI Bleed Sister        diverticulitis   Thyroid disease Neg Hx     Social History   Tobacco Use   Smoking status: Never   Smokeless tobacco: Never  Vaping Use   Vaping Use: Never used  Substance Use Topics   Alcohol use: No   Drug use: No    Home Medications Prior to Admission medications   Medication Sig Start Date End Date Taking? Authorizing Provider  acetaminophen (TYLENOL) 325 MG tablet Take 650 mg by mouth every 6 (six) hours as needed for mild pain or fever.     [provider]  ALPRAZolam Duanne Moron) 0.5 MG tablet Take 2 tablets approximately 45 minutes prior to the MRI study, take a third tablet if needed. 02/20/21   Kathrynn Ducking, MD  Amino Acids-Protein Hydrolys (FEEDING SUPPLEMENT, PRO-STAT SUGAR FREE 64,) LIQD Take 30 mLs by mouth daily.    [provider]  baclofen (LIORESAL) 10 MG tablet Take 10-15 mg by mouth daily. (0800 & 1200) Take 1.5 tablets (15 mg) by mouth in the morning & take 1 tablet (10 mg) by mouth at noon.    [provider]  baclofen (LIORESAL) 20 MG tablet Take 20 mg by mouth at bedtime. (2000)    [provider]  barrier cream (NON-SPECIFIED) CREA Apply 1 application topically 2 (two) times daily. (0700 & 1900) Apply to right ischium and bilateral buttocks    [provider]  bisacodyl (DULCOLAX) 10 MG suppository Place 10 mg rectally daily as needed for moderate constipation.    [provider]  calcium citrate (CALCITRATE - DOSED IN MG ELEMENTAL CALCIUM) 950 (200 Ca) MG tablet Take 200 mg of elemental calcium by mouth daily.    [provider]  carboxymethylcellul-glycerin (OPTIVE) 0.5-0.9 % ophthalmic solution Place 1 drop into both eyes in the morning, at noon, in the evening, and at bedtime. (0800,1200, 1600 & 2000)    [provider]  cholecalciferol (VITAMIN D) 25 MCG (1000 UNIT) tablet Take 1,000 Units by mouth daily.    [provider]  cycloSPORINE (RESTASIS) 0.05 % ophthalmic emulsion Place 1 drop into both eyes 2 (two) times daily. (0800 & 2000)    [provider]  Dulaglutide (TRULICITY) 3 XB/3.5HG SOPN Inject 3 mg into the skin every Friday. (0800)    [provider]  DULoxetine (CYMBALTA) 60 MG capsule Take 1 capsule (60 mg total) by mouth 2 (two) times daily. (0800)Give along with 30 mg to = 90 mg Patient taking differently: Take 60 mg by mouth 2 (two) times daily. (1000 & 1900) 02/20/21   Kathrynn Ducking, MD  furosemide (LASIX) 20 MG tablet Take 20 mg by mouth in the morning.    [provider]  gabapentin (NEURONTIN) 100 MG capsule Take 200 mg by mouth 3 (three) times daily. (0800, 1400 & 2000)    [provider]  guaiFENesin (MUCINEX) 600 MG 12 hr tablet Take 1,200 mg by mouth at  bedtime. (2000)     [provider]  insulin glargine (LANTUS) 100 UNIT/ML Solostar Pen Inject 15 Units into the skin at bedtime. (2000)    [provider]  loperamide (IMODIUM) 2 MG capsule Take 2 mg by mouth as needed for diarrhea or loose stools (SUBSEQUENT DOSE GIVE ONE TAB AFTER EACH STOOL (max 8 mg/24 hrs.)).    [provider]  magnesium hydroxide (MILK OF MAGNESIA) 400 MG/5ML suspension Take 30 mLs by mouth daily as needed for mild constipation.    [provider]  methimazole (TAPAZOLE) 5 MG tablet Take 1 tablet (5 mg total) by mouth daily. Patient taking differently: Take 5 mg by mouth daily at 6 (six) AM. (0600) 11/11/17   Renato Shin, MD  metoprolol succinate (TOPROL-XL) 50 MG 24 hr tablet Take 50 mg by mouth daily. (0800)    [provider]  Multiple Vitamin (MULTIVITAMIN WITH MINERALS) TABS tablet Take 1 tablet by mouth daily. (0800)    [provider]  ondansetron (ZOFRAN) 4 MG tablet Take 4 mg by mouth every 6 (six) hours as needed for nausea or vomiting.    [provider]  pantoprazole (PROTONIX) 40 MG tablet Take 40 mg by mouth daily at 6 (six) AM. (0600)    [provider]  potassium chloride SA (KLOR-CON) 20 MEQ tablet Take 20 mEq by mouth 2 (two) times a week. Mondays & Fridays    [provider]  predniSONE (DELTASONE) 2.5 MG tablet Take 2.5 mg by mouth every other day. (0800) Alternating days with 5 mg dose    [provider]  predniSONE (DELTASONE) 5 MG tablet Take 5 mg by mouth every other day. (0800) Alternating days with 2.5 mg dose    [provider]  SF 5000 PLUS 1.1 % CREA dental cream Take 1 application by mouth daily at 8 pm. (2000) 12/13/20   [provider]  Sodium Phosphates (RA ENEMA) 7-19 GM/118ML ENEM Place 118 mLs rectally daily as needed (severe constipation).    [provider]  solifenacin (VESICARE) 10 MG tablet Take 10 mg by mouth daily. (0800)    [provider]  TECFIDERA 240 MG CPDR Take 1 capsule (240 mg total) by mouth 2 (two) times daily. (0800 & 2000) 01/25/21   Kathrynn Ducking, MD  vitamin C (ASCORBIC ACID) 500 MG tablet Take 500 mg by mouth in the morning and at bedtime.    [provider]  zinc sulfate 220 (50 Zn) MG capsule Take 220 mg by mouth daily.    [provider]    Allergies    Codeine, Ultram [tramadol], and Januvia [sitagliptin]  Review of Systems   Review of Systems  Constitutional:  Negative for fever.  HENT:  Negative for sore throat.   Eyes:  Negative for pain.  Respiratory:  Negative for shortness of breath.   Cardiovascular:  Negative for chest pain.  Gastrointestinal:  Positive for diarrhea and nausea. Negative for abdominal pain and vomiting.  Genitourinary:  Negative for dysuria.  Musculoskeletal:  Positive for gait problem.  Skin:  Positive for wound. Negative for rash.  Neurological:  Positive for weakness (chronic BLE). Negative for headaches.   Physical Exam Updated Vital Signs BP 96/82 (BP Location: Left Arm)   Pulse (!) 111   Temp 98.9 F (37.2 C) (Oral)   Resp 18   SpO2 91%   Physical Exam Vitals and nursing note reviewed.  Constitutional:      General: She  is not in acute distress.    Appearance: Normal appearance. She is well-developed.  HENT:     Head: Normocephalic and atraumatic.  Eyes:     Conjunctiva/sclera: Conjunctivae normal.  Cardiovascular:     Rate and Rhythm: Regular rhythm. Tachycardia present.     Heart sounds: No murmur heard. Pulmonary:     Effort: Pulmonary effort is normal. No respiratory distress.     Breath sounds: Normal breath sounds.  Abdominal:     Palpations: Abdomen is soft.     Tenderness: There is no abdominal tenderness. There is no guarding or rebound.     Comments: Suprapubic catheter in place  Musculoskeletal:        General: No tenderness or signs of injury.     Cervical back: Neck supple.     Comments: PICC line right  upper arm nontender  Skin:    General: Skin is warm and dry.     Comments: She has a large deep sacral ulceration that likely tracks down to bone.  Neurological:     Mental Status: She is alert. Mental status is at baseline.     Comments: She is awake and alert.  No difficulty using her upper extremities.  Minimal to no use of her lower extremities.     ED Results / Procedures / Treatments   Labs (all labs ordered are listed, but only abnormal results are displayed) Labs Reviewed  BASIC METABOLIC PANEL - Abnormal; Notable for the following components:      Result Value   Potassium 3.4 (*)    Chloride 96 (*)    CO2 35 (*)    Glucose, Bld 107 (*)    Creatinine, Ser 0.42 (*)    Calcium 8.5 (*)    All other components within normal limits  CBC WITH DIFFERENTIAL/PLATELET - Abnormal; Notable for the following components:   WBC 16.9 (*)    RBC 3.64 (*)    Hemoglobin 9.8 (*)    HCT 31.2 (*)    RDW 19.9 (*)    Platelets 585 (*)    Neutro Abs 14.9 (*)    Abs Immature Granulocytes 0.15 (*)    All other components within normal limits  SEDIMENTATION RATE - Abnormal; Notable for the following components:   Sed Rate 88 (*)    All other components within normal limits  C-REACTIVE PROTEIN - Abnormal; Notable for the following components:   CRP 7.6 (*)    All other components within normal limits  RESP PANEL BY RT-PCR (FLU A&B, COVID) [id number]  PROTIME-INR    EKG None  Radiology DG Chest Port 1 View  Result Date: 04/05/2021 CLINICAL DATA:  Check PICC location EXAM: PORTABLE CHEST 1 VIEW COMPARISON:  03/06/2021 FINDINGS: RIGHT PICC line with tip in the distal SVC at the cavoatrial junction. Stable cardiac silhouette. Mild atelectasis in the LEFT midlung. IMPRESSION: PICC line appears in good position. Electronically Signed   By: Suzy Bouchard M.D.   On: 04/05/2021 14:59    Procedures Procedures   Medications Ordered in ED Medications  influenza vaccine adjuvanted (FLUAD)  injection 0.5 mL (has no administration in time range)  sodium chloride 0.9 % bolus 1,000 mL (0 mLs Intravenous Stopped 04/05/21 1642)    ED Course  I have reviewed the triage vital signs and the nursing notes.  Pertinent labs & imaging results that were available during my care of the patient were reviewed by me and considered in my medical decision making (see chart for  details).  Clinical Course as of 04/05/21 1831  Thu Apr 05, 2021  1251 Discussed with Dr. Drucilla Schmidt infectious disease who saw the patient earlier today.  Will need to be admitted to the hospital for consultation regarding drainage of this abscess on her back. [MB]  3382 Discussed with general surgery PA Ms. Anice Paganini who will see in consult. [MB]  1431 EKG not crossing.  Sinus tachycardia normal intervals left axis deviation probable old anterior septal infarct.  No significant change from prior EKG March 2022 [MB]  5053 Chest x-ray interpreted by me as packing in appropriate position.  No gross infiltrates.  Awaiting radiology reading. [MB]    Clinical Course User Index [MB] Hayden Rasmussen, MD   MDM Rules/Calculators/A&P                          This patient complains of sacral ulceration; this involves an extensive number of treatment Options and is a complaint that carries with it a high risk of complications and Morbidity. The differential includes decubitus ulcer, osteomyelitis, abscess, sepsis, Sirs  I ordered, reviewed and interpreted labs, which included CBC with elevated white count, hemoglobin 7 baseline, chemistries fairly unremarkable inflammatory markers elevated, flu and COVID-negative I ordered medication IV fluids I ordered imaging studies which included chest x-ray and I independently    visualized and interpreted imaging which showed PICC in good position no acute infiltrates Additional history obtained from EMS Previous records obtained and reviewed in epic including prior ID notes I consulted Dr.  Drucilla Schmidt infectious disease, general surgery and discussed lab and imaging findings  Critical Interventions: None  After the interventions stated above, I reevaluated the patient and found patient to be hemodynamically stable although still borderline tachycardic.  ID recommendations are for admission to the hospital.  General surgery is consulting on patient.  Patient's labs are not back yet so signed out to oncoming provider Dr. Venita Sheffield to contact [facility] hospitalist once labs resulted.  Anticipate will need admission to the hospital for continued management.   Final Clinical Impression(s) / ED Diagnoses Final diagnoses:  Pressure injury of sacral region, unstageable ([place])  Osteomyelitis of sacrum ([place])  MS (multiple sclerosis) ([place])  Paraplegia ([place])    Rx / DC Orders ED Discharge Orders     None        Hayden Rasmussen, MD 04/05/21 1839

## 2021-04-05 NOTE — Telephone Encounter (Signed)
Per MD called [facility] to see if they would be able to transport patient to [facility] if directly admitted. Spoke with Cassandra who states they are not able to transport pt due to being transported on stretcher.  Cassandra will reach out to transportation scheduler to see if they could work something out. If not patient will need to be sent to ED for evaluation on sacral osteo wound.  Will hold off on calling bed control until SNF calls back.  Leatrice Jewels, RMA

## 2021-04-05 NOTE — Consult Note (Signed)
Consult Note  Laura Roberts Dec 13, 1940  [id number].    Requesting MD: Dr. Melina Copa Chief Complaint/Reason for Consult: Sacral decubitus ulcer with abscess  HPI:  80 yo female with medical history significant for MS, paraplegia, T2DM, presence of SPT, neurogenic bowel, HTN, GERD and acute on chronic decubitus ulcer with osteomyelitis. She has been seeing infectious disease for her ulcer and osteomyelitis. She had been on IV vancomycin via PICC at her SNF for at least a few weeks. She saw infectious disease in clinic today for follow up who recommended she proceed to hospital for surgical evaluation  She had and MRI on 9/23 which showed sacral decubitus ulcer with acute on chronic osteomyelitis as well as 3.0 cm abscess superior to ulcer  She states she has had decreased appetite over the last 3 weeks and has had nausea and emesis. Last episode of emesis was in September. ROS otherwise as below  Allergies: codeine, ultram Blood thinners: none  ROS: Review of Systems  Constitutional:  Negative for chills and fever.       Loss of appetite  Respiratory:  Negative for cough, shortness of breath and wheezing.   Cardiovascular:  Negative for chest pain and palpitations.  Gastrointestinal:  Positive for diarrhea, nausea and vomiting. Negative for abdominal pain and constipation.  Genitourinary: Negative.    Family History  Problem Relation Age of Onset   Multiple sclerosis Other        neices.    Cancer Mother    Diabetes Mother    GI Bleed Sister        diverticulitis   Thyroid disease Neg Hx     Past Medical History:  Diagnosis Date   Age related osteoporosis    Aphasia    Bronchitis    hx of    Cellulitis and abscess of toe    hx of    Constipation    Cystostomy in place [facility])    Decubitus ulcer    Degenerative arthritis    osteoarthritis    Depression    Diabetes mellitus without complication ([place])    type 2    Edema    localized    Full incontinence of  feces    Gait disturbance    GERD (gastroesophageal reflux disease)    Gout    hx of    History of falling    Hyperlipidemia    Hypertension    Hyperthyroidism    Multiple sclerosis (HCC)    Neurogenic bowel    Neuromuscular disorder (HCC)    Bilateral hand carpal tunnel syndrome   Neuromuscular dysfunction of bladder    Nontoxic multinodular goiter    Obesity    Osteomyelitis of coccyx ([place]) 04/05/2021   Osteoporosis    Other adrenocortical insufficiency (HCC)    Paraplegia (HCC)    LEGS   Personal history of urinary (tract) infections    Polyneuropathy    Pressure ulcer of right buttock, stage 4 (HCC)    Sacral osteomyelitis ([place]) 04/05/2021   Spinal stenosis in cervical region    Spinal stenosis of lumbosacral region    Spinal stenosis, thoracic    Tinea unguium    Vitamin D deficiency     Past Surgical History:  Procedure Laterality Date   ABDOMINAL HYSTERECTOMY     BACK SURGERY     BOTOX INJECTION N/A 01/30/2018   Procedure: CYSTOSCOPY BOTOX INJECTION, SUPRAPUBIC EXCHANGE;  Surgeon: Lucas Mallow, MD;  Location: [facility];  Service: Urology;  Laterality: N/A;   BOTOX INJECTION N/A 09/02/2018   Procedure: BOTOX INJECTION;  Surgeon: Lucas Mallow, MD;  [facility];  Service: Urology;  Laterality: N/A;   BOTOX INJECTION N/A 05/10/2019   Procedure: CYSTOSCOPY BOTOX INJECTION, SUPRAPUBIC EXCHANGE;  Surgeon: Lucas Mallow, MD;  [facility];  Service: Urology;  Laterality: N/A;   BOTOX INJECTION N/A 09/06/2019   Procedure: CYSTOSCOPY BOTOX INJECTION;  Surgeon: Lucas Mallow, MD;  [facility];  Service: Urology;  Laterality: N/A;   BOTOX INJECTION N/A 04/10/2020   Procedure: CYSTOSCOPY BOTOX INJECTION;  Surgeon: Festus Aloe, MD;  [facility];  Service: Urology;  Laterality: N/A;   BOTOX INJECTION N/A 09/13/2020   Procedure: CYSTOSCOPYBOTOX INJECTION SUPRAPUBIC TUBE UPSIZE AND EXCHANGE;  Surgeon: Lucas Mallow, MD;  [facility];   Service: Urology;  Laterality: N/A;   BOTOX INJECTION N/A 03/07/2021   Procedure: CYSTOSCOPY BOTOX INJECTION AND SUPRAPUBIC TUBE EXCHANGE;  Surgeon: Lucas Mallow, MD;  [facility];  Service: Urology;  Laterality: N/A;  REQUESTING 42 MINS FOR CASE   CATARACT EXTRACTION Right    CHOLECYSTECTOMY     CYSTOSCOPY N/A 09/02/2018   Procedure: CYSTOSCOPY;  Surgeon: Lucas Mallow, MD;  [facility];  Service: Urology;  Laterality: N/A;   CYSTOSTOMY N/A 09/02/2018   Procedure: CYSTOSTOMY SUPRAPUBIC;  Surgeon: Lucas Mallow, MD;  [facility];  Service: Urology;  Laterality: N/A;  45 MINS   FEMUR IM NAIL Right 06/02/2014   Procedure: INTRAMEDULLARY (IM) RETROGRADE FEMORAL NAILING;  Surgeon: Johnny Bridge, MD;  Location: [facility];  Service: Orthopedics;  Laterality: Right;   GROIN DISSECTION Left 10/24/2017   Procedure: IRRIGATION AND DEBRIDEMENT, LEFT THIGH ABCESS ;  Surgeon: Alphonsa Overall, MD;  [facility];  Service: General;  Laterality: Left;   INSERTION OF SUPRAPUBIC CATHETER N/A 09/06/2019   Procedure: SUPRAPUBIC TUBE CHANGE;  Surgeon: Lucas Mallow, MD;  [facility];  Service: Urology;  Laterality: N/A;   INSERTION OF SUPRAPUBIC CATHETER N/A 04/10/2020   Procedure: SUPRAPUBIC CATHETER EXCHANGE/TRACT  DILATION;  Surgeon: Festus Aloe, MD;  [facility];  Service: Urology;  Laterality: N/A;   IR CATHETER TUBE CHANGE  10/13/2017   IR CATHETER TUBE CHANGE  07/15/2018   IR CATHETER TUBE CHANGE  08/26/2019   IR CATHETER TUBE CHANGE  11/25/2019   IR CATHETER TUBE CHANGE  01/06/2020   IR CATHETER TUBE CHANGE  06/13/2020   IR CATHETER TUBE CHANGE  08/01/2020   left hand carpal tunnel surgery     REPLACEMENT TOTAL KNEE BILATERAL  2004    Social History:  reports that she has never smoked. She has never used smokeless tobacco. She reports that she does not drink alcohol and does not use drugs.  Allergies:  Allergies  Allergen Reactions   Codeine Other (See Comments)     Difficult breathing and skin problem   Ultram [Tramadol] Other (See Comments)    Difficult breathing and skin peeling   Januvia [Sitagliptin] Rash and Other (See Comments)    Blisters    (Not in a hospital admission)   Blood pressure 96/82, pulse (!) 111, temperature 98.9 F (37.2 C), temperature source Oral, resp. rate 18, SpO2 91 %. Physical Exam:  General: pleasant, WD, female who is laying in bed in NAD HEENT: head is normocephalic, atraumatic.  Sclera are noninjected.  Pupils equal and round.  Ears and nose  without any masses or lesions.  Mouth is pink and moist Heart: tachycardic, regular rhythm.  Normal s1,s2. No obvious murmurs, gallops, or rubs noted.  Palpable radial and pedal pulses bilaterally Lungs: CTAB, no wheezes, rhonchi, or rales noted.  Respiratory effort nonlabored Abd: soft, NT, ND, +BS, no masses, hernias, or organomegaly MS: all 4 extremities are symmetrical with no cyanosis, or edema. Skin: warm and dry. Chronic ulcer with stool contaminant and fibrinous tissue at base. No necrosis and margins without erythema. Palpable bone. There is undermining of wound along entirety of border. See picture below  GU: SPT with cloudy yellow urine. Site with expected exudate but without erythema Psych: A&Ox3 with an appropriate affect.     No results found for this or any previous visit (from the past 48 hour(s)). No results found.    Assessment/Plan Sacral decubitus ulcer - stage IV Osteomyelitis Abscess - 3 cm - given interval since last imaging recommend further imaging prior to potential surgical planning. Nothing visible/palpable on external exam. This could be CT with IV contrast but will defer modality to ID.  - open wound likely colonized at this point and we do not perform bone biopsy or cultures  - abx as per ID - none currently - labs pending at time of my consultation - WOC consult - pending surgical plans may also benefit from PT hydrotherapy  FEN:  regular, NPO MN in case of surgical needs ID: none currently VTE: okay for chemical prophylaxis from our perspective  Agree with hospitalist admission and we will follow   T2DM GERD HTN Neurogenic bowel [place], [facility] Surgery 04/05/2021, 2:24 PM Please see Amion for pager number during day hours 7:00am-4:30pm

## 2021-04-06 DIAGNOSIS — I10 Essential (primary) hypertension: Secondary | ICD-10-CM | POA: Diagnosis not present

## 2021-04-06 DIAGNOSIS — M8660 Other chronic osteomyelitis, unspecified site: Secondary | ICD-10-CM | POA: Diagnosis not present

## 2021-04-06 DIAGNOSIS — J9612 Chronic respiratory failure with hypercapnia: Secondary | ICD-10-CM | POA: Diagnosis not present

## 2021-04-06 DIAGNOSIS — L8990 Pressure ulcer of unspecified site, unspecified stage: Secondary | ICD-10-CM | POA: Insufficient documentation

## 2021-04-06 DIAGNOSIS — Z9359 Other cystostomy status: Secondary | ICD-10-CM | POA: Diagnosis not present

## 2021-04-06 LAB — CBC
HCT: 29 % — ABNORMAL LOW (ref 36.0–46.0)
Hemoglobin: 9.4 g/dL — ABNORMAL LOW (ref 12.0–15.0)
MCH: 27.6 pg (ref 26.0–34.0)
MCHC: 32.4 g/dL (ref 30.0–36.0)
MCV: 85 fL (ref 80.0–100.0)
Platelets: 549 10*3/uL — ABNORMAL HIGH (ref 150–400)
RBC: 3.41 MIL/uL — ABNORMAL LOW (ref 3.87–5.11)
RDW: 20 % — ABNORMAL HIGH (ref 11.5–15.5)
WBC: 12.5 10*3/uL — ABNORMAL HIGH (ref 4.0–10.5)
nRBC: 0 % (ref 0.0–0.2)

## 2021-04-06 LAB — BLOOD GAS, VENOUS
Acid-Base Excess: 9.3 mmol/L — ABNORMAL HIGH (ref 0.0–2.0)
Bicarbonate: 33.6 mmol/L — ABNORMAL HIGH (ref 20.0–28.0)
O2 Saturation: 94.5 %
Patient temperature: 98.6
pCO2, Ven: 45.7 mmHg (ref 44.0–60.0)
pH, Ven: 7.479 — ABNORMAL HIGH (ref 7.250–7.430)
pO2, Ven: 69.2 mmHg — ABNORMAL HIGH (ref 32.0–45.0)

## 2021-04-06 LAB — BASIC METABOLIC PANEL
Anion gap: 7 (ref 5–15)
BUN: 10 mg/dL (ref 8–23)
CO2: 33 mmol/L — ABNORMAL HIGH (ref 22–32)
Calcium: 8.2 mg/dL — ABNORMAL LOW (ref 8.9–10.3)
Chloride: 99 mmol/L (ref 98–111)
Creatinine, Ser: 0.38 mg/dL — ABNORMAL LOW (ref 0.44–1.00)
GFR, Estimated: >60 mL/min (ref >60)
Glucose, Bld: 80 mg/dL (ref 70–99)
Potassium: 2.8 mmol/L — ABNORMAL LOW (ref 3.5–5.1)
Sodium: 139 mmol/L (ref 135–145)

## 2021-04-06 LAB — MAGNESIUM: Magnesium: 1.8 mg/dL (ref 1.7–2.4)

## 2021-04-06 LAB — HEMOGLOBIN A1C
Hgb A1c MFr Bld: 5.9 % — ABNORMAL HIGH (ref 4.8–5.6)
Mean Plasma Glucose: 122.63 mg/dL

## 2021-04-06 LAB — CBG MONITORING, ED
Glucose-Capillary: 58 mg/dL — ABNORMAL LOW (ref 70–99)
Glucose-Capillary: 97 mg/dL (ref 70–99)
Glucose-Capillary: 107 mg/dL — ABNORMAL HIGH (ref 70–99)

## 2021-04-06 MED ORDER — CHLORHEXIDINE GLUCONATE CLOTH 2 % EX PADS
6.0000 | MEDICATED_PAD | Freq: Every day | CUTANEOUS | Status: DC
  Administered 2021-04-06 – 2021-04-07 (×2): 6 via TOPICAL

## 2021-04-06 MED ORDER — COLLAGENASE 250 UNIT/GM EX OINT
TOPICAL_OINTMENT | Freq: Every day | CUTANEOUS | Status: DC
  Filled 2021-04-06: qty 30

## 2021-04-06 MED ORDER — POTASSIUM CHLORIDE 10 MEQ/100ML IV SOLN
10.0000 meq | INTRAVENOUS | Status: AC
  Administered 2021-04-06 (×3): 10 meq via INTRAVENOUS
  Filled 2021-04-06 (×3): qty 100

## 2021-04-06 NOTE — Progress Notes (Signed)
Inpatient Diabetes Program Recommendations  AACE/ADA: New Consensus Statement on Inpatient Glycemic Control (2015)  Target Ranges:  Prepandial:   less than 140 mg/dL      Peak postprandial:   less than 180 mg/dL (1-2 hours)      Critically ill patients:  140 - 180 mg/dL   Lab Results  Component Value Date   GLUCAP 58 (L) 04/06/2021   HGBA1C 5.9 (H) 04/06/2021    Review of Glycemic Control Results for Laura Roberts, Laura Roberts (MRN [id number]) as of 04/06/2021 09:36  Ref. Range 04/05/2021 23:42 04/06/2021 07:55  Glucose-Capillary Latest Ref Range: 70 - 99 mg/dL 127 (H) 58 (L)   Chronic Osteo sacral wound Diabetes history: DM 2 Outpatient Diabetes medications: Trulicity 3 mg QFriday, Lantus 15 units Daily Current orders for Inpatient glycemic control:  Semglee 15 units Novolog 0-15 units tid + hs Low dose prednisone A1c 5.9% on 10/7  Inpatient Diabetes Program Recommendations:    Hypoglycemia 58 this am  - reduce Semlgee to 10 units  Thanks,  Tama Headings RN, MSN, BC-ADM Inpatient Diabetes Coordinator Team Pager [phone] (8a-5p)

## 2021-04-06 NOTE — Progress Notes (Signed)
Id brief note   A/p Chronic sacral ulcer associated om and soft tissue infection  Await ct with contrast pelvis. EGS following; planning I&D soft tissue if abscess present; there is no plan per EGS team to debride/sample the infected bone  Patient remains off abx   Both bone/tissue culture would be needed for target therapy. However, limited option to sampling procedure   -await ct/egs management input -if no bone biopsy can base empiric treatment on tissue culture if available -for chronic sacral OM, oral abx would be fine; likely will start back on IV after procedure and let Dr Tommy Medal decides on clinic f/u

## 2021-04-06 NOTE — ED Notes (Signed)
Breakfast tray given. °

## 2021-04-06 NOTE — ED Notes (Signed)
Pt given cranberry juice.

## 2021-04-06 NOTE — Progress Notes (Addendum)
[facility]  PROGRESS NOTE  Laura Roberts FFM:[id number] DOB: February 18, 1941 DOA: 04/05/2021 PCP: [name], MD   Brief HPI:   80 year old female with history of MS on Tecfidera, paraplegia, bedbound with suprapubic catheter in place for neurogenic bladder, diabetes mellitus type 2, multinodular goiter on Tapazole presented from ID clinic for surgery consultation. She was found to have leukocytosis by MD at [facility] over a month ago.  Extensive work-up done over there, source was thought to be chronic stage IV sacral decubitus ulcer.  MRI sacrum showed erosion of sacrum adjacent to large ulcer as well as 3 cm abscess.  She was referred to ID who recommended direct admission to hospital for surgical debridement and deep culture. General surgery has been consulted.    Subjective   Patient seen and examined, denies any complaints.   Assessment/Plan:    Chronic osteomyelitis -MRI recently done showed bone erosion, chronic osteomyelitis and 3 cm abscess -General surgery consulted for deep culture and debridement -Antibiotics per ID  Chronic respiratory failure -Likely ventilatory failure from multiple sclerosis -VBG this morning shows PCO2 of 45.2 -O2 sats 92% on room air   Multiple sclerosis -Continue prednisone, Tecfidera -Continue baclofen  Diabetes mellitus type 2 -Continue Lantus -Holding home Trulicity -Continue sliding scale insulin with NovoLog -CBG well controlled  Hypertension -Continue Lasix, metoprolol  Multinodular goiter -Followed by Dr. Loanne Drilling -Continue Tapazole  Paraplegia -Secondary to multiple sclerosis -Suprapubic catheter in place -Continue duloxetine, gabapentin, baclofen  Hypokalemia -Potassium was 2.8 -Replace potassium and follow BMP in am  Anemia of chronic disease -Hemoglobin stable    Scheduled medications:    baclofen  10 mg Oral Q1400   baclofen  15 mg Oral Q breakfast   baclofen  20 mg Oral QHS   collagenase    Topical Daily   darifenacin  15 mg Oral Daily   Dimethyl Fumarate  240 mg Oral BID   DULoxetine  90 mg Oral BID   enoxaparin (LOVENOX) injection  40 mg Subcutaneous Q24H   furosemide  20 mg Oral q AM   gabapentin  200 mg Oral TID   insulin aspart  0-15 Units Subcutaneous TID WC   insulin aspart  0-5 Units Subcutaneous QHS   insulin glargine-yfgn  15 Units Subcutaneous QHS   methimazole  5 mg Oral Q0600   metoprolol succinate  50 mg Oral QAC breakfast   pantoprazole  40 mg Oral Q0600   predniSONE  2.5 mg Oral Q M,W,F,Su-1800   [START ON 04/07/2021] predniSONE  5 mg Oral Q T,Th,Sat-1800     Data Reviewed:   CBG:  Recent Labs  Lab 04/05/21 2342 04/06/21 0755 04/06/21 1157  GLUCAP 127* 58* 97    SpO2: 92 %    Vitals:   04/06/21 0612 04/06/21 0901 04/06/21 1143 04/06/21 1443  BP:  115/63 (!) 92/55 110/64  Pulse: (!) 105 (!) 106 (!) 107 (!) 103  Resp: (!) 22 17 19 17   Temp:      TempSrc:      SpO2: 92% 92% 92% 92%     Intake/Output Summary (Last 24 hours) at 04/06/2021 1554 Last data filed at 04/05/2021 1642 Gross per 24 hour  Intake 1000 ml  Output --  Net 1000 ml    10/05 1901 - 10/07 0700 In: 1000  Out: -   There were no vitals filed for this visit.  Data Reviewed: Basic Metabolic Panel: Recent Labs  Lab 04/05/21 1439 04/06/21 0535  NA 140 139  K  3.4* 2.8*  CL 96* 99  CO2 35* 33*  GLUCOSE 107* 80  BUN 15 10  CREATININE 0.42* 0.38*  CALCIUM 8.5* 8.2*  MG  --  1.8   Liver Function Tests: No results for input(s): AST, ALT, ALKPHOS, BILITOT, PROT, ALBUMIN in the last 168 hours. No results for input(s): LIPASE, AMYLASE in the last 168 hours. No results for input(s): AMMONIA in the last 168 hours. CBC: Recent Labs  Lab 04/05/21 1439 04/06/21 0535  WBC 16.9* 12.5*  NEUTROABS 14.9*  --   HGB 9.8* 9.4*  HCT 31.2* 29.0*  MCV 85.7 85.0  PLT 585* 549*   Cardiac Enzymes: No results for input(s): CKTOTAL, CKMB, CKMBINDEX, TROPONINI in the last 168  hours. BNP (last 3 results) No results for input(s): BNP in the last 8760 hours.  ProBNP (last 3 results) No results for input(s): PROBNP in the last 8760 hours.  CBG: Recent Labs  Lab 04/05/21 2342 04/06/21 0755 04/06/21 1157  GLUCAP 127* 58* 97       Radiology Reports  DG Chest Port 1 View  Result Date: 04/05/2021 CLINICAL DATA:  Check PICC location EXAM: PORTABLE CHEST 1 VIEW COMPARISON:  03/06/2021 FINDINGS: RIGHT PICC line with tip in the distal SVC at the cavoatrial junction. Stable cardiac silhouette. Mild atelectasis in the LEFT midlung. IMPRESSION: PICC line appears in good position. Electronically Signed   By: Suzy Bouchard M.D.   On: 04/05/2021 14:59       Antibiotics: Anti-infectives (From admission, onward)    None         DVT prophylaxis: Lovenox  Code Status: DNR  Family Communication: No family at bedside   Consultants:   Procedures:     Objective    Physical Examination:   General-appears in no acute distress Heart-S1-S2, regular, no murmur auscultated Lungs-clear to auscultation bilaterally, no wheezing or crackles auscultated Abdomen-soft, nontender, no organomegaly Extremities-1+ edema in both lower extremities  Neuro-alert, oriented x3,  paraplegia  Status is: Inpatient  Dispo: The patient is from: Skilled nursing facility              Anticipated d/c is to: Skilled nursing facility              Anticipated d/c date is: 04/10/2021              Patient currently not stable for discharge  Barrier to discharge-ongoing treatment for decubitus ulcer  COVID-19 Labs  Recent Labs    04/05/21 1439  CRP 7.6*    Lab Results  Component Value Date   SARSCOV2NAA NEGATIVE 04/05/2021   [place] NEGATIVE 03/06/2021   [place] NEGATIVE 09/13/2020   [place] NEGATIVE 04/10/2020     Pressure Injury 10/09/17 Stage II -  Partial thickness loss of dermis presenting as a shallow open ulcer with a red, pink wound bed  without slough. healed stage 2 pressure injury (Active)  10/09/17 2110  Location: Buttocks  Location Orientation: Left;Posterior;Proximal  Staging: Stage II -  Partial thickness loss of dermis presenting as a shallow open ulcer with a red, pink wound bed without slough.  Wound Description (Comments): healed stage 2 pressure injury  Present on Admission: Yes     Pressure Injury 01/30/18 Stage II -  Partial thickness loss of dermis presenting as a shallow open ulcer with a red, pink wound bed without slough. stage 2  (Active)  01/30/18 2045  Location: Coccyx  Location Orientation: Posterior  Staging: Stage II -  Partial thickness loss of dermis  presenting as a shallow open ulcer with a red, pink wound bed without slough.  Wound Description (Comments): stage 2   Present on Admission: Yes     Pressure Injury 01/30/18 Stage II -  Partial thickness loss of dermis presenting as a shallow open ulcer with a red, pink wound bed without slough. stage 2 (Active)  01/30/18 2045  Location: Thigh  Location Orientation: Left;Posterior;Proximal  Staging: Stage II -  Partial thickness loss of dermis presenting as a shallow open ulcer with a red, pink wound bed without slough.  Wound Description (Comments): stage 2  Present on Admission: Yes     Pressure Injury 01/30/18 diabetic ulcer on great toe on right foot (Active)  01/30/18 2100  Location: Toe (Comment  which one)  Location Orientation: Anterior;Right  Staging:   Wound Description (Comments): diabetic ulcer on great toe on right foot  Present on Admission: Yes     Pressure Injury 04/06/21 Sacrum Medial Stage 4 - Full thickness tissue loss with exposed bone, tendon or muscle. (Active)  04/06/21 0937  Location: Sacrum  Location Orientation: Medial  Staging: Stage 4 - Full thickness tissue loss with exposed bone, tendon or muscle.  Wound Description (Comments):   Present on Admission: Yes        Recent Results (from the past 240 hour(s))   Resp Panel by RT-PCR (Flu A&B, Covid) Nasopharyngeal Swab     Status: None   Collection Time: 04/05/21  3:41 PM   Specimen: Nasopharyngeal Swab; Nasopharyngeal(NP) swabs in vial transport medium  Result Value Ref Range Status   SARS Coronavirus 2 by RT PCR NEGATIVE NEGATIVE Final    Comment: (NOTE) SARS-CoV-2 target nucleic acids are NOT DETECTED.  The SARS-CoV-2 RNA is generally detectable in upper respiratory specimens during the acute phase of infection. The lowest concentration of SARS-CoV-2 viral copies this assay can detect is 138 copies/mL. A negative result does not preclude SARS-Cov-2 infection and should not be used as the sole basis for treatment or other patient management decisions. A negative result may occur with  improper specimen collection/handling, submission of specimen other than nasopharyngeal swab, presence of viral mutation(s) within the areas targeted by this assay, and inadequate number of viral copies(<138 copies/mL). A negative result must be combined with clinical observations, patient history, and epidemiological information. The expected result is Negative.  Fact Sheet for Patients:  EntrepreneurPulse.com.au  Fact Sheet for Healthcare Providers:  IncredibleEmployment.be  This test is no t yet approved or cleared by the [place] FDA and  has been authorized for detection and/or diagnosis of SARS-CoV-2 by FDA under an Emergency Use Authorization (EUA). This EUA will remain  in effect (meaning this test can be used) for the duration of the COVID-19 declaration under Section 564(b)(1) of the Act, 21 U.S.C.section 360bbb-3(b)(1), unless the authorization is terminated  or revoked sooner.       Influenza A by PCR NEGATIVE NEGATIVE Final   Influenza B by PCR NEGATIVE NEGATIVE Final    Comment: (NOTE) The Xpert Xpress SARS-CoV-2/FLU/RSV plus assay is intended as an aid in the diagnosis of influenza from  Nasopharyngeal swab specimens and should not be used as a sole basis for treatment. Nasal washings and aspirates are unacceptable for Xpert Xpress SARS-CoV-2/FLU/RSV testing.  Fact Sheet for Patients: EntrepreneurPulse.com.au  Fact Sheet for Healthcare Providers: IncredibleEmployment.be  This test is not yet approved or cleared by the [place] FDA and has been authorized for detection and/or diagnosis of SARS-CoV-2 by FDA under an Emergency  Use Authorization (EUA). This EUA will remain in effect (meaning this test can be used) for the duration of the COVID-19 declaration under Section 564(b)(1) of the Act, 21 U.S.C. section 360bbb-3(b)(1), unless the authorization is terminated or revoked.  Performed at [facility], [place] [address] [place], [place] [address]     Oswald Hillock   [facility] If 7PM-7AM, please contact night-coverage at [email or web address], Office  [phone]   04/06/2021, 3:54 PM  LOS: 1 day

## 2021-04-06 NOTE — Consult Note (Signed)
[place] Nurse wound consult note Consultation was completed by review of records, images and assistance from the bedside nurse/clinical staff.  Reason for Consult: sacral pressure injury Wound type: Stage 4 Pressure Injury; chronic with osteomylitis.  Notes indicated [facility] following and sent patient to hospital for plastic surgery consult; however general surgery is following patient currently pending CT results may need surgical intevention Pressure Injury POA: Yes Measurement: will have bedside nurse obtain with skin assessment and provision of topical care  Wound bed:80% pink/20% slough Drainage (amount, consistency, odor) heavy; dressings saturated in images  Periwound: intact  Dressing procedure/placement/frequency:  Surgery did not order wound care I have updated and added enzymatic debridement agent, pack with saline fluff gauze and top with dry dressing. Secure with tape. Change daily. Low air loss mattress for moisture management and pressure redistribution. Consult to RD for wound healing for significant Stage 4 Pressure injury.    Re consult if needed, will not follow at this time. Thanks  Krisy Dix [employer], RN,CWOCN, CNS, [place] [phone])

## 2021-04-06 NOTE — Progress Notes (Signed)
No further imaging completed.  Will await f/u CT before deciding on any operative drainage.  Ok for diet today from our standpoint.  No plans for deep tissue biopsy unless CT show fluid collection that needs to be drained  Rosario Adie, MD  [facility]

## 2021-04-07 ENCOUNTER — Inpatient Hospital Stay (HOSPITAL_COMMUNITY): Payer: Medicare Other | Admitting: Certified Registered Nurse Anesthetist

## 2021-04-07 ENCOUNTER — Encounter (HOSPITAL_COMMUNITY)
Admission: EM | Disposition: A | Discharge status: Skilled Nursing Facility | Payer: Self-pay | Source: Skilled Nursing Facility | Attending: Family Medicine

## 2021-04-07 DIAGNOSIS — M8660 Other chronic osteomyelitis, unspecified site: Secondary | ICD-10-CM | POA: Diagnosis not present

## 2021-04-07 HISTORY — PX: APPLICATION OF WOUND VAC: SHX5189

## 2021-04-07 HISTORY — PX: INCISION AND DRAINAGE PERIRECTAL ABSCESS: SHX1804

## 2021-04-07 HISTORY — PX: LAPAROSCOPY: SHX197

## 2021-04-07 LAB — CBC
HCT: 27.5 % — ABNORMAL LOW (ref 36.0–46.0)
Hemoglobin: 8.8 g/dL — ABNORMAL LOW (ref 12.0–15.0)
MCH: 27.2 pg (ref 26.0–34.0)
MCHC: 32 g/dL (ref 30.0–36.0)
MCV: 85.1 fL (ref 80.0–100.0)
Platelets: 498 10*3/uL — ABNORMAL HIGH (ref 150–400)
RBC: 3.23 MIL/uL — ABNORMAL LOW (ref 3.87–5.11)
RDW: 20.1 % — ABNORMAL HIGH (ref 11.5–15.5)
WBC: 11.7 10*3/uL — ABNORMAL HIGH (ref 4.0–10.5)
nRBC: 0 % (ref 0.0–0.2)

## 2021-04-07 LAB — BASIC METABOLIC PANEL
Anion gap: 8 (ref 5–15)
BUN: 6 mg/dL — ABNORMAL LOW (ref 8–23)
CO2: 35 mmol/L — ABNORMAL HIGH (ref 22–32)
Calcium: 8.2 mg/dL — ABNORMAL LOW (ref 8.9–10.3)
Chloride: 100 mmol/L (ref 98–111)
Creatinine, Ser: 0.43 mg/dL — ABNORMAL LOW (ref 0.44–1.00)
GFR, Estimated: >60 mL/min (ref >60)
Glucose, Bld: 99 mg/dL (ref 70–99)
Potassium: 2.9 mmol/L — ABNORMAL LOW (ref 3.5–5.1)
Sodium: 143 mmol/L (ref 135–145)

## 2021-04-07 LAB — SURGICAL PCR SCREEN
MRSA, PCR: POSITIVE — AB
Staphylococcus aureus: POSITIVE — AB

## 2021-04-07 LAB — GLUCOSE, CAPILLARY
Glucose-Capillary: 96 mg/dL (ref 70–99)
Glucose-Capillary: 69 mg/dL — ABNORMAL LOW (ref 70–99)
Glucose-Capillary: 104 mg/dL — ABNORMAL HIGH (ref 70–99)
Glucose-Capillary: 178 mg/dL — ABNORMAL HIGH (ref 70–99)
Glucose-Capillary: 135 mg/dL — ABNORMAL HIGH (ref 70–99)

## 2021-04-07 SURGERY — INCISION AND DRAINAGE, ABSCESS, PERIRECTAL
Anesthesia: General | Site: Back

## 2021-04-07 MED ORDER — LACTATED RINGERS IR SOLN
Status: DC | PRN
  Administered 2021-04-07: 1000 mL

## 2021-04-07 MED ORDER — DEXAMETHASONE SODIUM PHOSPHATE 10 MG/ML IJ SOLN
INTRAMUSCULAR | Status: DC | PRN
  Administered 2021-04-07: 4 mg via INTRAVENOUS

## 2021-04-07 MED ORDER — ROCURONIUM BROMIDE 10 MG/ML (PF) SYRINGE
PREFILLED_SYRINGE | INTRAVENOUS | Status: DC | PRN
  Administered 2021-04-07: 80 mg via INTRAVENOUS

## 2021-04-07 MED ORDER — BUPIVACAINE-EPINEPHRINE 0.25% -1:200000 IJ SOLN
INTRAMUSCULAR | Status: DC | PRN
  Administered 2021-04-07: 30 mL

## 2021-04-07 MED ORDER — PHENYLEPHRINE HCL-NACL 20-0.9 MG/250ML-% IV SOLN
INTRAVENOUS | Status: DC | PRN
  Administered 2021-04-07: 80 ug/min via INTRAVENOUS

## 2021-04-07 MED ORDER — PHENYLEPHRINE 40 MCG/ML (10ML) SYRINGE FOR IV PUSH (FOR BLOOD PRESSURE SUPPORT)
PREFILLED_SYRINGE | INTRAVENOUS | Status: DC | PRN
  Administered 2021-04-07: 80 ug via INTRAVENOUS
  Administered 2021-04-07 (×3): 120 ug via INTRAVENOUS
  Administered 2021-04-07: 80 ug via INTRAVENOUS
  Administered 2021-04-07: 120 ug via INTRAVENOUS
  Administered 2021-04-07: 80 ug via INTRAVENOUS
  Administered 2021-04-07: 120 ug via INTRAVENOUS

## 2021-04-07 MED ORDER — SUGAMMADEX SODIUM 200 MG/2ML IV SOLN
INTRAVENOUS | Status: DC | PRN
  Administered 2021-04-07: 200 mg via INTRAVENOUS

## 2021-04-07 MED ORDER — OXYCODONE HCL 5 MG PO TABS
5.0000 mg | ORAL_TABLET | ORAL | Status: DC | PRN
  Administered 2021-04-07 – 2021-04-09 (×4): 5 mg via ORAL
  Filled 2021-04-07 (×4): qty 1

## 2021-04-07 MED ORDER — DEXTROSE 5 % IV SOLN: 500.0000 mg | Freq: Four times a day (QID) | INTRAVENOUS | Status: DC | PRN

## 2021-04-07 MED ORDER — AMISULPRIDE (ANTIEMETIC) 5 MG/2ML IV SOLN: 10.0000 mg | Freq: Once | INTRAVENOUS | Status: DC | PRN

## 2021-04-07 MED ORDER — LACTATED RINGERS IV SOLN
INTRAVENOUS | Status: DC | PRN
Start: 2021-04-07 — End: 2021-04-07

## 2021-04-07 MED ORDER — CEFAZOLIN SODIUM-DEXTROSE 2-3 GM-%(50ML) IV SOLR
INTRAVENOUS | Status: DC | PRN
  Administered 2021-04-07: 2 g via INTRAVENOUS

## 2021-04-07 MED ORDER — CHLORHEXIDINE GLUCONATE CLOTH 2 % EX PADS
6.0000 | MEDICATED_PAD | Freq: Every day | CUTANEOUS | Status: AC
Start: 2021-04-08 — End: 2021-04-11
  Administered 2021-04-08 – 2021-04-11 (×5): 6 via TOPICAL

## 2021-04-07 MED ORDER — BUPIVACAINE-EPINEPHRINE (PF) 0.25% -1:200000 IJ SOLN
INTRAMUSCULAR | Status: AC
  Filled 2021-04-07: qty 30

## 2021-04-07 MED ORDER — FENTANYL CITRATE (PF) 100 MCG/2ML IJ SOLN
INTRAMUSCULAR | Status: AC
  Filled 2021-04-07: qty 2

## 2021-04-07 MED ORDER — CEFAZOLIN SODIUM-DEXTROSE 2-4 GM/100ML-% IV SOLN
INTRAVENOUS | Status: AC
  Filled 2021-04-07: qty 100

## 2021-04-07 MED ORDER — ROCURONIUM BROMIDE 10 MG/ML (PF) SYRINGE
PREFILLED_SYRINGE | INTRAVENOUS | Status: AC
  Filled 2021-04-07: qty 10

## 2021-04-07 MED ORDER — DEXAMETHASONE SODIUM PHOSPHATE 10 MG/ML IJ SOLN
INTRAMUSCULAR | Status: AC
  Filled 2021-04-07: qty 1

## 2021-04-07 MED ORDER — LIDOCAINE 2% (20 MG/ML) 5 ML SYRINGE
INTRAMUSCULAR | Status: DC | PRN
  Administered 2021-04-07: 60 mg via INTRAVENOUS

## 2021-04-07 MED ORDER — PROMETHAZINE HCL 25 MG/ML IJ SOLN: 6.2500 mg | INTRAMUSCULAR | Status: DC | PRN

## 2021-04-07 MED ORDER — FENTANYL CITRATE PF 50 MCG/ML IJ SOSY: 25.0000 ug | PREFILLED_SYRINGE | INTRAMUSCULAR | Status: DC | PRN

## 2021-04-07 MED ORDER — ONDANSETRON HCL 4 MG/2ML IJ SOLN
INTRAMUSCULAR | Status: DC | PRN
  Administered 2021-04-07: 4 mg via INTRAVENOUS

## 2021-04-07 MED ORDER — HYDROMORPHONE HCL 1 MG/ML IJ SOLN
0.5000 mg | INTRAMUSCULAR | Status: DC | PRN
  Administered 2021-04-07 – 2021-04-13 (×5): 0.5 mg via INTRAVENOUS
  Filled 2021-04-07 (×5): qty 0.5

## 2021-04-07 MED ORDER — PROPOFOL 10 MG/ML IV BOLUS
INTRAVENOUS | Status: DC | PRN
  Administered 2021-04-07: 100 mg via INTRAVENOUS

## 2021-04-07 MED ORDER — LACTATED RINGERS IV SOLN: INTRAVENOUS | Status: DC

## 2021-04-07 MED ORDER — PROPOFOL 10 MG/ML IV BOLUS
INTRAVENOUS | Status: AC
  Filled 2021-04-07: qty 20

## 2021-04-07 MED ORDER — METRONIDAZOLE 500 MG/100ML IV SOLN
INTRAVENOUS | Status: DC | PRN
  Administered 2021-04-07: 500 mg via INTRAVENOUS

## 2021-04-07 MED ORDER — PHENYLEPHRINE HCL (PRESSORS) 10 MG/ML IV SOLN
INTRAVENOUS | Status: AC
  Filled 2021-04-07: qty 2

## 2021-04-07 MED ORDER — OXYCODONE HCL 5 MG PO TABS: 10.0000 mg | ORAL_TABLET | ORAL | Status: DC | PRN

## 2021-04-07 MED ORDER — METRONIDAZOLE 500 MG/100ML IV SOLN
INTRAVENOUS | Status: AC
  Filled 2021-04-07: qty 100

## 2021-04-07 MED ORDER — ONDANSETRON HCL 4 MG/2ML IJ SOLN
INTRAMUSCULAR | Status: AC
  Filled 2021-04-07: qty 2

## 2021-04-07 MED ORDER — ALBUMIN HUMAN 5 % IV SOLN
INTRAVENOUS | Status: AC
  Filled 2021-04-07: qty 500

## 2021-04-07 MED ORDER — ALBUMIN HUMAN 5 % IV SOLN: INTRAVENOUS | Status: DC | PRN

## 2021-04-07 MED ORDER — 0.9 % SODIUM CHLORIDE (POUR BTL) OPTIME
TOPICAL | Status: DC | PRN
  Administered 2021-04-07 (×2): 1000 mL

## 2021-04-07 MED ORDER — FENTANYL CITRATE (PF) 100 MCG/2ML IJ SOLN
INTRAMUSCULAR | Status: DC | PRN
  Administered 2021-04-07: 25 ug via INTRAVENOUS
  Administered 2021-04-07: 75 ug via INTRAVENOUS

## 2021-04-07 MED ORDER — MUPIROCIN 2 % EX OINT
1.0000 "application " | TOPICAL_OINTMENT | Freq: Two times a day (BID) | CUTANEOUS | Status: AC
  Administered 2021-04-07 – 2021-04-12 (×10): 1 via NASAL
  Filled 2021-04-07 (×2): qty 22

## 2021-04-07 SURGICAL SUPPLY — 89 items
ADH SKN CLS APL DERMABOND .7 (GAUZE/BANDAGES/DRESSINGS) ×3
APL PRP STRL LF DISP 70% ISPRP (MISCELLANEOUS) ×3
APPLIER CLIP 5 13 M/L LIGAMAX5 (MISCELLANEOUS)
APPLIER CLIP ROT 10 11.4 M/L (STAPLE)
APR CLP MED LRG 11.4X10 (STAPLE)
APR CLP MED LRG 5 ANG JAW (MISCELLANEOUS)
BAG COUNTER SPONGE SURGICOUNT (BAG) IMPLANT
BAG SPNG CNTER NS LX DISP (BAG)
BLADE EXTENDED COATED 6.5IN (ELECTRODE) IMPLANT
BLADE HEX COATED 2.75 (ELECTRODE) ×4 IMPLANT
BLADE SURG SZ10 CARB STEEL (BLADE) ×4 IMPLANT
CANISTER WOUNDNEG PRESSURE 500 (CANNISTER) ×1 IMPLANT
CATH ROBINSON RED A/P 16FR (CATHETERS) ×1 IMPLANT
CELLS DAT CNTRL 66122 CELL SVR (MISCELLANEOUS) IMPLANT
CHLORAPREP W/TINT 26 (MISCELLANEOUS) ×4 IMPLANT
CLIP APPLIE 5 13 M/L LIGAMAX5 (MISCELLANEOUS) IMPLANT
CLIP APPLIE ROT 10 11.4 M/L (STAPLE) IMPLANT
COVER MAYO STAND STRL (DRAPES) IMPLANT
COVER SURGICAL LIGHT HANDLE (MISCELLANEOUS) ×4 IMPLANT
DECANTER SPIKE VIAL GLASS SM (MISCELLANEOUS) ×4 IMPLANT
DERMABOND ADVANCED (GAUZE/BANDAGES/DRESSINGS) ×1
DERMABOND ADVANCED .7 DNX12 (GAUZE/BANDAGES/DRESSINGS) IMPLANT
DRAPE LAPAROSCOPIC ABDOMINAL (DRAPES) IMPLANT
DRAPE LAPAROTOMY T 102X78X121 (DRAPES) IMPLANT
DRAPE LAPAROTOMY TRNSV 102X78 (DRAPES) IMPLANT
DRAPE SHEET LG 3/4 BI-LAMINATE (DRAPES) IMPLANT
DRAPE WARM FLUID 44X44 (DRAPES) IMPLANT
DRSG VAC ATS MED SENSATRAC (GAUZE/BANDAGES/DRESSINGS) ×1 IMPLANT
ELECT REM PT RETURN 15FT ADLT (MISCELLANEOUS) ×4 IMPLANT
GAUZE SPONGE 4X4 12PLY STRL (GAUZE/BANDAGES/DRESSINGS) ×4 IMPLANT
GLOVE SRG 8 PF TXTR STRL LF DI (GLOVE) ×3 IMPLANT
GLOVE SURG ENC MOIS LTX SZ7 (GLOVE) ×12 IMPLANT
GLOVE SURG ENC MOIS LTX SZ7.5 (GLOVE) ×4 IMPLANT
GLOVE SURG ENC TEXT LTX SZ7.5 (GLOVE) ×4 IMPLANT
GLOVE SURG POLY ORTHO LF SZ7.5 (GLOVE) ×4 IMPLANT
GLOVE SURG UNDER POLY LF SZ7 (GLOVE) ×4 IMPLANT
GLOVE SURG UNDER POLY LF SZ7.5 (GLOVE) ×12 IMPLANT
GLOVE SURG UNDER POLY LF SZ8 (GLOVE) ×4
GOWN STRL REUS W/ TWL XL LVL3 (GOWN DISPOSABLE) ×3 IMPLANT
GOWN STRL REUS W/TWL LRG LVL3 (GOWN DISPOSABLE) ×8 IMPLANT
GOWN STRL REUS W/TWL XL LVL3 (GOWN DISPOSABLE) ×20 IMPLANT
HANDLE SUCTION POOLE (INSTRUMENTS) IMPLANT
IRRIG SUCT STRYKERFLOW 2 WTIP (MISCELLANEOUS) ×4
IRRIGATION SUCT STRKRFLW 2 WTP (MISCELLANEOUS) IMPLANT
KIT BASIN OR (CUSTOM PROCEDURE TRAY) ×4 IMPLANT
KIT COLOSTOMY ILEOSTOMY 4 (WOUND CARE) ×1 IMPLANT
KIT TURNOVER KIT A (KITS) ×4 IMPLANT
LEGGING LITHOTOMY PAIR STRL (DRAPES) IMPLANT
LOOP OSTOMY BRIDGE (OSTOMY) ×1 IMPLANT
MARKER SKIN DUAL TIP RULER LAB (MISCELLANEOUS) ×4 IMPLANT
NDL HYPO 25X1 1.5 SAFETY (NEEDLE) ×3 IMPLANT
NDL INSUFFLATION 14GA 120MM (NEEDLE) IMPLANT
NEEDLE HYPO 25X1 1.5 SAFETY (NEEDLE) ×4 IMPLANT
NEEDLE INSUFFLATION 14GA 120MM (NEEDLE) ×4 IMPLANT
NS IRRIG 1000ML POUR BTL (IV SOLUTION) ×4 IMPLANT
PACK BASIC VI WITH GOWN DISP (CUSTOM PROCEDURE TRAY) ×4 IMPLANT
PENCIL SMOKE EVACUATOR (MISCELLANEOUS) IMPLANT
RETRACTOR WND ALEXIS 18 MED (MISCELLANEOUS) IMPLANT
RTRCTR WOUND ALEXIS 18CM MED (MISCELLANEOUS)
SCISSORS LAP 5X35 DISP (ENDOMECHANICALS) ×4 IMPLANT
SHEARS HARMONIC ACE PLUS 36CM (ENDOMECHANICALS) IMPLANT
SLEEVE XCEL OPT CAN 5 100 (ENDOMECHANICALS) ×5 IMPLANT
SPONGE T-LAP 18X18 ~~LOC~~+RFID (SPONGE) IMPLANT
SPONGE T-LAP 4X18 ~~LOC~~+RFID (SPONGE) IMPLANT
STAPLER VISISTAT 35W (STAPLE) IMPLANT
STRIP CLOSURE SKIN 1/2X4 (GAUZE/BANDAGES/DRESSINGS) IMPLANT
SUCTION POOLE HANDLE (INSTRUMENTS)
SUT MNCRL AB 4-0 PS2 18 (SUTURE) IMPLANT
SUT PDS AB 1 TP1 96 (SUTURE) IMPLANT
SUT PROLENE 2 0 KS (SUTURE) IMPLANT
SUT PROLENE 2 0 SH DA (SUTURE) IMPLANT
SUT SILK 2 0 (SUTURE)
SUT SILK 2 0 SH CR/8 (SUTURE) IMPLANT
SUT SILK 2-0 18XBRD TIE 12 (SUTURE) IMPLANT
SUT SILK 3 0 (SUTURE)
SUT SILK 3 0 SH CR/8 (SUTURE) IMPLANT
SUT SILK 3-0 18XBRD TIE 12 (SUTURE) IMPLANT
SUT VIC AB 3-0 SH 18 (SUTURE) IMPLANT
SYR BULB IRRIG 60ML STRL (SYRINGE) IMPLANT
SYR CONTROL 10ML LL (SYRINGE) ×4 IMPLANT
SYS LAPSCP GELPORT 120MM (MISCELLANEOUS)
SYSTEM LAPSCP GELPORT 120MM (MISCELLANEOUS) IMPLANT
TOWEL OR 17X26 10 PK STRL BLUE (TOWEL DISPOSABLE) ×4 IMPLANT
TOWEL OR NON WOVEN STRL DISP B (DISPOSABLE) ×4 IMPLANT
TRAY FOLEY MTR SLVR 16FR STAT (SET/KITS/TRAYS/PACK) ×4 IMPLANT
TRAY LAPAROSCOPIC (CUSTOM PROCEDURE TRAY) ×4 IMPLANT
TROCAR BLADELESS OPT 5 100 (ENDOMECHANICALS) ×4 IMPLANT
TROCAR XCEL NON-BLD 11X100MML (ENDOMECHANICALS) IMPLANT
YANKAUER SUCT BULB TIP NO VENT (SUCTIONS) IMPLANT

## 2021-04-07 NOTE — Progress Notes (Signed)
Progress Note: General Surgery Service   Chief Complaint/Subjective: Laura Roberts says she can't see the wound or feel the wound.  She is paraplegic from [place] and is unable to transfer to the toilet and usually just requires nurses to clean her bed when she has a bowel movement.  Objective: Vital signs in last 24 hours: Temp:  [98.4 F (36.9 C)-98.9 F (37.2 C)] 98.9 F (37.2 C) (10/08 0724) Pulse Rate:  [55-108] 105 (10/08 0724) Resp:  [16-20] 18 (10/08 0724) BP: (92-121)/(55-69) 121/65 (10/08 0724) SpO2:  [92 %-95 %] 95 % (10/08 0724) Weight:  [82.1 kg] 82.1 kg (10/07 1823) Last BM Date: 04/06/21  Intake/Output from previous day: 10/07 0701 - 10/08 0700 In: -  Out: 1800 [Urine:1800] Intake/Output this shift: No intake/output data recorded.  Constitutional: NAD; conversant; no deformities Eyes: Moist conjunctiva; no lid lag; anicteric; PERRL Neck: Trachea midline; no thyromegaly Lungs: Normal respiratory effort; no tactile fremitus CV: RRR; no palpable thrills; no pitting edema GI: Abd soft, non-tender, suprapubic catheter in place, no upper abdominal scars; no palpable hepatosplenomegaly Sacral decubitus ulcer with some packing.  Some adherent slough and palpable bone in the base of the wound, some healthy granulation tissue, possibly some fluctuance above the wound cavity MSK: paraplegic with no movement of the legs, no clubbing/cyanosis Psychiatric: Appropriate affect; alert and oriented x3 Lymphatic: No palpable cervical or axillary lymphadenopathy  Lab Results: CBC  Recent Labs    04/06/21 0535 04/07/21 0645  WBC 12.5* 11.7*  HGB 9.4* 8.8*  HCT 29.0* 27.5*  PLT 549* 498*   BMET Recent Labs    04/06/21 0535 04/07/21 0645  NA 139 143  K 2.8* 2.9*  CL 99 100  CO2 33* 35*  GLUCOSE 80 99  BUN 10 6*  CREATININE 0.38* 0.43*  CALCIUM 8.2* 8.2*   PT/INR Recent Labs    04/05/21 1439  LABPROT 14.8  INR 1.2   ABG Recent Labs    04/06/21 0535  HCO3 33.6*     Anti-infectives: Anti-infectives (From admission, onward)    None       Medications: Scheduled Meds:  baclofen  10 mg Oral Q1400   baclofen  15 mg Oral Q breakfast   baclofen  20 mg Oral QHS   Chlorhexidine Gluconate Cloth  6 each Topical Daily   collagenase   Topical Daily   darifenacin  15 mg Oral Daily   Dimethyl Fumarate  240 mg Oral BID   DULoxetine  90 mg Oral BID   enoxaparin (LOVENOX) injection  40 mg Subcutaneous Q24H   furosemide  20 mg Oral q AM   gabapentin  200 mg Oral TID   insulin aspart  0-15 Units Subcutaneous TID WC   insulin aspart  0-5 Units Subcutaneous QHS   insulin glargine-yfgn  15 Units Subcutaneous QHS   methimazole  5 mg Oral Q0600   metoprolol succinate  50 mg Oral QAC breakfast   pantoprazole  40 mg Oral Q0600   predniSONE  2.5 mg Oral Q M,W,F,Su-1800   predniSONE  5 mg Oral Q T,Th,Sat-1800   Continuous Infusions: PRN Meds:.acetaminophen **OR** acetaminophen, bisacodyl, influenza vaccine adjuvanted, magnesium hydroxide, ondansetron **OR** ondansetron (ZOFRAN) IV, ondansetron  Assessment/Plan: Laura Roberts is a 80 year old female with paraplegia secondary to MS who is bedbound and unable to transfer to the toilet for bowel movements and requires care to clean the bed with every bowel movement.  She has a chronic decubitus ulcer, stage 4 with palpable bone in  the base of the wound.  I recommend incision and debridement of this wound in the OR to clean up the wound.  I also recommend laparoscopic loop transverse colostomy to divert the stream of stool away from the wound to allow the wound to heal.  As she is unable to use the bedside commode, this could help improve her quality of life and need for care in the long run.  I discussed the risks, benefits and alternatives with the patient.  After a full discussion and all questions answered the patient granted consent to proceed.  We will add her on for surgery today.    LOS: 2 days     Felicie Morn, MD  [facility] Use [email or web address] to contact on call provider

## 2021-04-07 NOTE — Anesthesia Postprocedure Evaluation (Signed)
Anesthesia Post Note  Patient: Laura Roberts  Procedure(s) Performed: DEBRIDEMENT OF SACRAL DECUBITUS ULCER (Back) LAPAROSCOPY DIAGNOSTIC; LAPAROSCOPIC CREATION OF TRANSVERSE COLOSTOMY (Abdomen) APPLICATION OF WOUND VAC (Back)     Patient location during evaluation: PACU Anesthesia Type: General Level of consciousness: sedated Pain management: pain level controlled Vital Signs Assessment: post-procedure vital signs reviewed and stable Respiratory status: spontaneous breathing and respiratory function stable Cardiovascular status: stable Postop Assessment: no apparent nausea or vomiting Anesthetic complications: no   No notable events documented.  Last Vitals:  Vitals:   04/07/21 1430 04/07/21 1445  BP: 114/64 121/67  Pulse: 92 95  Resp: 12 13  Temp: (!) 36.3 C   SpO2: 99% 98%    Last Pain:  Vitals:   04/07/21 1445  TempSrc:   PainSc: 0-No pain                 Nykayla Marcelli DANIEL

## 2021-04-07 NOTE — Progress Notes (Signed)
PROGRESS NOTE  Laura Roberts HUT:[id number] DOB: [birth year] DOA: 04/05/2021 PCP: [name], MD  HPI/Recap of past 24 hours: This is a 80 year old female resident of [place] with history of multiple sclerosis on [place], paraplegia bedbound with suprapubic catheter in place for neurogenic bladder, diabetes mellitus type 2, multinodular goiter on Tapazole who presented from the ID clinic for surgery consultation for decubitus ulcer she had had extensive work-up in months prior She was found to have leukocytosis due to chronic stage IV sacral decubitus ulcer MRI of the sacrum showed erosion of the sacrum adjacent to a large ulcer as well as 3 cm abscess she was referred to ID who recommended direct admission to hospital for surgical debridement and deep culture, surgery was consulted who took her to the OR today for debridement  Subjective: April 07, 2021: Patient seen and examined at bedside she just came back from operating room f patient underwent debridement and deep tissue culture Patient denies any pain  Assessment/Plan: Principal Problem:   Chronic osteomyelitis ([place]) Active Problems:   Multiple sclerosis ([place])   Essential hypertension   Paraplegia ([place])   Hypokalemia   Type 2 diabetes mellitus with diabetic neuropathy, with long-term current use of insulin ([place])   Chronic suprapubic catheter ([place])   Decubitus ulcer   Multinodular goiter   Anemia in other chronic diseases classified elsewhere   Chronic respiratory failure ([place])   Pressure injury of skin  Chronic osteomyelitis: Recent MRI shows bony erosion and a 3 cm abscess General surgery was consulted and she underwent wound debridement with a deep tissue culture Infectious disease following  Chronic respiratory failure Patient is on nasal cannula O2  Multiple sclerosis Continue prednisone and Tecfidera Continue baclofen  Type 2 diabetes mellitus Continue Lantus Holding home Trulicity Continue  sliding scale and NovoLog   Hypertension with mild lower extremity edema Continue Lexis and metoprolol  Multinodular goiter Follow-up with Dr. Loanne Drilling Continue Tapazole  Paraplegia secondary to multiple sclerosis Suprapubic catheter is in place Continue duloxetine gabapentin and baclofen  Anemia of chronic disease Hemoglobin is stable Code Status: DNR  Severity of Illness: The appropriate patient status for this patient is INPATIENT. Inpatient status is judged to be reasonable and necessary in order to provide the required intensity of service to ensure the patient's safety. The patient's presenting symptoms, physical exam findings, and initial radiographic and laboratory data in the context of their chronic comorbidities is felt to place them at high risk for further clinical deterioration. Furthermore, it is not anticipated that the patient will be medically stable for discharge from the hospital within 2 midnights of admission. The following factors support the patient status of inpatient.   " Wound culture wound treatment  * I certify that at the point of admission it is my clinical judgment that the patient will require inpatient hospital care spanning beyond 2 midnights from the point of admission due to high intensity of service, high risk for further deterioration and high frequency of surveillance required.*   Family Communication: None at bedside  Disposition Plan: Status is: Inpatient  The patient is from: Skilled nursing facility              Anticipated d/c is to: Skilled nursing facility              Anticipated d/c date is: 04/10/2021              Patient currently not stable for discharge   Barrier to discharge-ongoing treatment for  decubitus ulcer    Consultants: Surgery Infectious disease  Procedures: Wound debridement  Antimicrobials: None  DVT prophylaxis: Lovenox   Objective: Vitals:   04/06/21 1823 04/06/21 2305 04/07/21 0334 04/07/21 0724   BP: (!) 110/59 113/69 115/66 121/65  Pulse: (!) 108 (!) 107 (!) 102 (!) 105  Resp: 16 20 19 18   Temp: 98.4 F (36.9 C) 98.5 F (36.9 C) 98.5 F (36.9 C) 98.9 F (37.2 C)  TempSrc: Oral Oral Oral Oral  SpO2: 92% 93% 95% 95%  Weight: 82.1 kg     Height: 5\' 2"  (1.575 m)       Intake/Output Summary (Last 24 hours) at 04/07/2021 0949 Last data filed at 04/07/2021 0525 Gross per 24 hour  Intake --  Output 1800 ml  Net -1800 ml   Filed Weights   04/06/21 1823  Weight: 82.1 kg   Body mass index is 33.1 kg/m.  Exam:  General: 80 y.o. year-old female well developed well nourished in no acute distress.  Alert and oriented x3.  Overweight Cardiovascular: Regular rate and rhythm with no rubs or gallops.  No thyromegaly or JVD noted.   Respiratory: Clear to auscultation with no wheezes or rales. Good inspiratory effort. Abdomen: Soft nontender nondistended with normal bowel sounds x4 quadrants.  Colostomy and colostomy bag in place Musculoskeletal: +1 lower extremity edema. 2/4 pulses in all 4 extremities. Skin: Sacral decubitus ulcer Psychiatry: Mood is appropriate for condition and setting Neurology: Paraplegia alert oriented x3 speech: Slow to answer questions    Data Reviewed: CBC: Recent Labs  Lab 04/05/21 1439 04/06/21 0535 04/07/21 0645  WBC 16.9* 12.5* 11.7*  NEUTROABS 14.9*  --   --   HGB 9.8* 9.4* 8.8*  HCT 31.2* 29.0* 27.5*  MCV 85.7 85.0 85.1  PLT 585* 549* 245*   Basic Metabolic Panel: Recent Labs  Lab 04/05/21 1439 04/06/21 0535 04/07/21 0645  NA 140 139 143  K 3.4* 2.8* 2.9*  CL 96* 99 100  CO2 35* 33* 35*  GLUCOSE 107* 80 99  BUN 15 10 6*  CREATININE 0.42* 0.38* 0.43*  CALCIUM 8.5* 8.2* 8.2*  MG  --  1.8  --    GFR: Estimated Creatinine Clearance: 57.5 mL/min (A) (by C-G formula based on SCr of 0.43 mg/dL (L)). Liver Function Tests: No results for input(s): AST, ALT, ALKPHOS, BILITOT, PROT, ALBUMIN in the last 168 hours. No results for  input(s): LIPASE, AMYLASE in the last 168 hours. No results for input(s): AMMONIA in the last 168 hours. Coagulation Profile: Recent Labs  Lab 04/05/21 1439  INR 1.2   Cardiac Enzymes: No results for input(s): CKTOTAL, CKMB, CKMBINDEX, TROPONINI in the last 168 hours. BNP (last 3 results) No results for input(s): PROBNP in the last 8760 hours. HbA1C: Recent Labs    04/06/21 0535  HGBA1C 5.9*   CBG: Recent Labs  Lab 04/05/21 2342 04/06/21 0755 04/06/21 1157 04/06/21 1724 04/07/21 0739  GLUCAP 127* 58* 97 107* 96   Lipid Profile: No results for input(s): CHOL, HDL, LDLCALC, TRIG, CHOLHDL, LDLDIRECT in the last 72 hours. Thyroid Function Tests: No results for input(s): TSH, T4TOTAL, FREET4, T3FREE, THYROIDAB in the last 72 hours. Anemia Panel: No results for input(s): VITAMINB12, FOLATE, FERRITIN, TIBC, IRON, RETICCTPCT in the last 72 hours. Urine analysis:    Component Value Date/Time   COLORURINE YELLOW 11/08/2017 2312   APPEARANCEUR CLOUDY (A) 11/08/2017 2312   LABSPEC 1.011 11/08/2017 2312   PHURINE 6.0 11/08/2017 2312   GLUCOSEU NEGATIVE 11/08/2017  [place] (A) 11/08/2017 2312   BILIRUBINUR NEGATIVE 11/08/2017 2312   KETONESUR NEGATIVE 11/08/2017 2312   PROTEINUR 100 (A) 11/08/2017 2312   UROBILINOGEN 1.0 08/04/2012 1938   NITRITE POSITIVE (A) 11/08/2017 2312   LEUKOCYTESUR LARGE (A) 11/08/2017 2312   Sepsis Labs: @LABRCNTIP (procalcitonin:4,lacticidven:4)  ) Recent Results (from the past 240 hour(s))  Resp Panel by RT-PCR (Flu A&B, Covid) Nasopharyngeal Swab     Status: None   Collection Time: 04/05/21  3:41 PM   Specimen: Nasopharyngeal Swab; Nasopharyngeal(NP) swabs in vial transport medium  Result Value Ref Range Status   SARS Coronavirus 2 by RT PCR NEGATIVE NEGATIVE Final    Comment: (NOTE) SARS-CoV-2 target nucleic acids are NOT DETECTED.  The SARS-CoV-2 RNA is generally detectable in upper respiratory specimens during the acute phase  of infection. The lowest concentration of SARS-CoV-2 viral copies this assay can detect is 138 copies/mL. A negative result does not preclude SARS-Cov-2 infection and should not be used as the sole basis for treatment or other patient management decisions. A negative result may occur with  improper specimen collection/handling, submission of specimen other than nasopharyngeal swab, presence of viral mutation(s) within the areas targeted by this assay, and inadequate number of viral copies(<138 copies/mL). A negative result must be combined with clinical observations, patient history, and epidemiological information. The expected result is Negative.  Fact Sheet for Patients:  EntrepreneurPulse.com.au  Fact Sheet for Healthcare Providers:  IncredibleEmployment.be  This test is no t yet approved or cleared by the [place] FDA and  has been authorized for detection and/or diagnosis of SARS-CoV-2 by FDA under an Emergency Use Authorization (EUA). This EUA will remain  in effect (meaning this test can be used) for the duration of the COVID-19 declaration under Section 564(b)(1) of the Act, 21 U.S.C.section 360bbb-3(b)(1), unless the authorization is terminated  or revoked sooner.       Influenza A by PCR NEGATIVE NEGATIVE Final   Influenza B by PCR NEGATIVE NEGATIVE Final    Comment: (NOTE) The Xpert Xpress SARS-CoV-2/FLU/RSV plus assay is intended as an aid in the diagnosis of influenza from Nasopharyngeal swab specimens and should not be used as a sole basis for treatment. Nasal washings and aspirates are unacceptable for Xpert Xpress SARS-CoV-2/FLU/RSV testing.  Fact Sheet for Patients: EntrepreneurPulse.com.au  Fact Sheet for Healthcare Providers: IncredibleEmployment.be  This test is not yet approved or cleared by the [place] FDA and has been authorized for detection and/or diagnosis of  SARS-CoV-2 by FDA under an Emergency Use Authorization (EUA). This EUA will remain in effect (meaning this test can be used) for the duration of the COVID-19 declaration under Section 564(b)(1) of the Act, 21 U.S.C. section 360bbb-3(b)(1), unless the authorization is terminated or revoked.  Performed at [facility], [address] [place], [place] [address]       Studies: No results found.  Scheduled Meds:  baclofen  10 mg Oral Q1400   baclofen  15 mg Oral Q breakfast   baclofen  20 mg Oral QHS   Chlorhexidine Gluconate Cloth  6 each Topical Daily   collagenase   Topical Daily   darifenacin  15 mg Oral Daily   Dimethyl Fumarate  240 mg Oral BID   DULoxetine  90 mg Oral BID   enoxaparin (LOVENOX) injection  40 mg Subcutaneous Q24H   furosemide  20 mg Oral q AM   gabapentin  200 mg Oral TID   insulin aspart  0-15 Units  Subcutaneous TID WC   insulin aspart  0-5 Units Subcutaneous QHS   insulin glargine-yfgn  15 Units Subcutaneous QHS   methimazole  5 mg Oral Q0600   metoprolol succinate  50 mg Oral QAC breakfast   pantoprazole  40 mg Oral Q0600   predniSONE  2.5 mg Oral Q M,W,F,Su-1800   predniSONE  5 mg Oral Q T,Th,Sat-1800    Continuous Infusions:   LOS: 2 days     Cristal Deer, MD [facility]  To reach me or the doctor on call, go to: [email or web address] Password [facility]  04/07/2021, 9:49 AM

## 2021-04-07 NOTE — Transfer of Care (Signed)
Immediate Anesthesia Transfer of Care Note  Patient: KASHINA MECUM  Procedure(s) Performed: DEBRIDEMENT OF SACRAL DECUBITUS ULCER (Back) LAPAROSCOPY DIAGNOSTIC; LAPAROSCOPIC CREATION OF TRANSVERSE COLOSTOMY (Abdomen) APPLICATION OF WOUND VAC (Back)  Patient Location: PACU  Anesthesia Type:General  Level of Consciousness: drowsy and patient cooperative  Airway & Oxygen Therapy: Patient Spontanous Breathing and Patient connected to face mask oxygen  Post-op Assessment: Report given to RN and Post -op Vital signs reviewed and stable  Post vital signs: Reviewed and stable  Last Vitals:  Vitals Value Taken Time  BP 115/60 04/07/21 1347  Temp    Pulse 93 04/07/21 1349  Resp 11 04/07/21 1350  SpO2 86 % 04/07/21 1349  Vitals shown include unvalidated device data.  Last Pain:  Vitals:   04/07/21 0724  TempSrc: Oral  PainSc:          Complications: No notable events documented.

## 2021-04-07 NOTE — Op Note (Signed)
Patient: Laura Roberts ([birth year], [id number])  Date of Surgery: 04/07/21  Preoperative Diagnosis: SACRAL DECUBITUS ULCER   Postoperative Diagnosis: SACRAL DECUBITUS ULCER   Surgical Procedure:  Sharp debridement of sacral decubitus ulcer using curette and electrocautery Wound vac application to sacral decubitus ulcer Wound measures 7cm tall by 5 cm wide by 2.5 cm deep with 2.5 cm of superior undermining   Laparoscopic creation of loop transverse colostomy   Operative Team Members:  Surgeon(s) and Role:    * Refujio Haymer, Nickola Major, Laura Roberts - Primary   Anesthesiologist: Duane Boston, MD CRNA: Milford Cage, CRNA; Montel Clock, CRNA   Anesthesia: General   Fluids:  Total I/O In: 1200 [P.O.:50; I.V.:650; IV Piggyback:500] Out: 260 [Urine:250; YQMVH:84]  Complications: None  Drains:   16 Fr Red Rubber used as transverse colostomy bar    Specimen:  ID Type Source Tests Collected by Time Destination  A : sacral bone Tissue PATH Bone resection AEROBIC/ANAEROBIC CULTURE W GRAM STAIN (SURGICAL/DEEP WOUND) Avary Pitsenbarger, Nickola Major, Laura Roberts 04/07/2021 1208      Disposition:  PACU - hemodynamically stable.  Plan of Care:  Continued inpatient care on medical service    Indications for Procedure: Laura Roberts is a 80 y.o. female with paraplegia secondary to [place] who presented with a sacral decubitus ulcer with fecal contamination.  I recommended debridement of the decubitus ulcer and laparoscopic transverse colostomy creation to divert the feces from the wound.    The procedure itself as well as its risks, benefits and alternatives were discussed.  The risks discussed included but were not limited to the risk of infection, bleeding, damage to nearby structures.  After a full discussion and all questions answered the patient granted consent to proceed.  The plan was discussed with the patient's sons Leane Para and Riggins.  Findings:  Sacral wound measuring 7cm tall by 5 cm wide by 2.5 cm  deep with 2.5 cm of undermining Normal intra-abdominal anatomy   Description of Procedure:   On the date stated above the patient was taken operating room suite and placed in prone position.  General endotracheal anesthesia was induced.  A timeout was completed verifying the correct patient, procedure, positioning, and equipment needed for the case.  Ancef and Flagyl were given prior to the case start.  SCDs were placed on the lower extremities to prevent DVT.  A photo was taken of the sacral wound prior to debridement.  Please see below.  The wound was sharply debrided to clear the necrotic tissue in the base of the wound and on the walls of the wound.  The wound was explored.  There were no areas of fluctuance surrounding the wound.  There was some undermining to the wound superiorly.  Bone was palpable in the base of the wound.  Tissue debrided from the wound was sent off for culture including some bone that was sent off for culture.  Once the necrotic tissue had been removed from the wound the wound was irrigated and hemostasis was ensured using electrocautery.  The total area of the wound was measured-7 cm tall by  5 cm wide by 2.5 cm deep with 2.5 cm of undermining superiorly.  A wound VAC was then applied using 2 black sponges and then a Tegaderm to cover the wound.  A hole was made over the black sponge and a bridge of black sponge was applied with another Tegaderm to cover that in order to bring the suction tubing away from the area of  pressure necrosis to avoid worsening the pressure necrosis issue.  This bridge was then connected to the suction tubing and suction was applied.  The wound VAC held suction, I anticipate this to seal may be difficult to maintain due to the proximity of this wound to the anus.  We then transitioned to the colostomy portion of the case.  The patient was rolled into supine position.  We again completed a timeout and began.  Incision was made just below the umbilicus  and a Veress needle was inserted in the abdomen the abdomen was insufflated to 15 mmHg.  3 5 mm trochars were placed across the abdomen at the level of the umbilicus.  The transverse colon was inspected.  There were no abnormalities noted within the abdomen.  The omentum was lifted and divided where it attaches to the transverse colon using a harmonic scalpel.  The omentum was split using the harmonic scalpel as well.  Hemostasis was obtained using the harmonic scalpel.  The omentum was lifted off the transverse colon proximally and distally from the area that the colostomy was to be formed.  The abdomen was then desufflated.  The location of the colostomy which had been marked in the preoperative area was elevated with an Allis and a circular incision was created.  This dissection was carried down to the fascia the anterior abdominal wall.  A cruciate incision was made in the anterior rectus fascia.  The rectus fibers were split along their length bring this down to the posterior fascia the peritoneum which were opened using scissors and then dilated using 2 fingers.  The transverse colon was lifted out of the wound leaving the omentum intra-abdominal.  A window was made in the mesocolon and the a red rubber drain tube was passed through this window and used as an ostomy bar to allow appropriate maturation of the colostomy.  The transverse colon was opened transversely and the ostomy was matured.  Multiple Vicryl sutures were used to [place] the ostomy both proximal limb and distal limb.  The ostomy appliance was cut to size and applied.  Monocryl and Dermabond were used to close the port sites.  All sponge needle counts were correct at end of this case.   At the end of the case we reviewed the infection status of the case. Patient: Laura Roberts Emergency General Surgery Service Patient Case: Urgent Infection Present At Time Of Surgery (PATOS):  Infected sacral wound, colon entered in formation of the colostomy   - spillage contained  Laura Raw, Laura Roberts General, Bariatric, & Minimally Invasive Surgery [facility]  Wound photo prior to debridement:

## 2021-04-07 NOTE — Anesthesia Preprocedure Evaluation (Addendum)
Anesthesia Evaluation  Patient identified by MRN, date of birth, ID band Patient awake    Reviewed: Allergy & Precautions, NPO status , Patient's Chart, lab work & pertinent test results  History of Anesthesia Complications Negative for: history of anesthetic complications  Airway Mallampati: II  TM Distance: >3 FB Neck ROM: Full    Dental  (+) Poor Dentition, Dental Advisory Given   Pulmonary neg pulmonary ROS,    Pulmonary exam normal        Cardiovascular hypertension, Pt. on home beta blockers and Pt. on medications Normal cardiovascular exam     Neuro/Psych PSYCHIATRIC DISORDERS Depression MS/paraplegic  Neuromuscular disease (MS, LE paraplegia, polyneuropathy)    GI/Hepatic Neg liver ROS, GERD  Medicated, Colostomy   Endo/Other  diabetes, Type 2, Oral Hypoglycemic AgentsHyperthyroidism  Nontoxic multinodular goiter K 3.3 Ca 7.3 Obesity   Renal/GU negative Renal ROSSuprapubic catheter     Musculoskeletal  (+) Arthritis ,  Gout    Abdominal   Peds  Hematology  (+) anemia ,     Anesthesia Other Findings   Reproductive/Obstetrics                            Anesthesia Physical  Anesthesia Plan  ASA: 3  Anesthesia Plan: General   Post-op Pain Management:    Induction: Intravenous  PONV Risk Score and Plan: 3 and Treatment may vary due to age or medical condition, Ondansetron and Dexamethasone  Airway Management Planned: Oral ETT  Additional Equipment: None  Intra-op Plan:   Post-operative Plan: Extubation in OR  Informed Consent: I have reviewed the patients History and Physical, chart, labs and discussed the procedure including the risks, benefits and alternatives for the proposed anesthesia with the patient or authorized representative who has indicated his/her understanding and acceptance.     Dental advisory given  Plan Discussed with: Anesthesiologist and  CRNA  Anesthesia Plan Comments:        Anesthesia Quick Evaluation

## 2021-04-07 NOTE — Anesthesia Procedure Notes (Signed)
Procedure Name: Intubation Date/Time: 04/07/2021 11:32 AM Performed by: Montel Clock, CRNA Pre-anesthesia Checklist: Patient identified, Emergency Drugs available, Suction available, Patient being monitored and Timeout performed Patient Re-evaluated:Patient Re-evaluated prior to induction Oxygen Delivery Method: Circle system utilized Preoxygenation: Pre-oxygenation with 100% oxygen Induction Type: IV induction Ventilation: Mask ventilation without difficulty Laryngoscope Size: Mac and 3 Grade View: Grade II Tube type: Oral Tube size: 7.0 mm Number of attempts: 1 Airway Equipment and Method: Stylet Placement Confirmation: ETT inserted through vocal cords under direct vision, positive ETCO2 and breath sounds checked- equal and bilateral Secured at: 21 cm Tube secured with: Tape Dental Injury: Teeth and Oropharynx as per pre-operative assessment

## 2021-04-08 DIAGNOSIS — M8660 Other chronic osteomyelitis, unspecified site: Secondary | ICD-10-CM | POA: Diagnosis not present

## 2021-04-08 LAB — GLUCOSE, CAPILLARY
Glucose-Capillary: 104 mg/dL — ABNORMAL HIGH (ref 70–99)
Glucose-Capillary: 105 mg/dL — ABNORMAL HIGH (ref 70–99)
Glucose-Capillary: 143 mg/dL — ABNORMAL HIGH (ref 70–99)

## 2021-04-08 MED ORDER — PHENOL 1.4 % MT LIQD
1.0000 | OROMUCOSAL | Status: DC | PRN
  Filled 2021-04-08: qty 177

## 2021-04-08 MED ORDER — ADULT MULTIVITAMIN W/MINERALS CH
1.0000 | ORAL_TABLET | Freq: Every day | ORAL | Status: DC
  Administered 2021-04-08 – 2021-04-13 (×6): 1 via ORAL
  Filled 2021-04-08 (×6): qty 1

## 2021-04-08 MED ORDER — ASCORBIC ACID 500 MG PO TABS
500.0000 mg | ORAL_TABLET | Freq: Two times a day (BID) | ORAL | Status: DC
  Administered 2021-04-08 – 2021-04-13 (×12): 500 mg via ORAL
  Filled 2021-04-08 (×12): qty 1

## 2021-04-08 MED ORDER — PROSOURCE PLUS PO LIQD
30.0000 mL | Freq: Three times a day (TID) | ORAL | Status: DC
  Administered 2021-04-08 – 2021-04-13 (×10): 30 mL via ORAL
  Filled 2021-04-08 (×9): qty 30

## 2021-04-08 MED ORDER — ENSURE MAX PROTEIN PO LIQD
11.0000 [oz_av] | Freq: Every day | ORAL | Status: DC
  Administered 2021-04-09 – 2021-04-13 (×5): 11 [oz_av] via ORAL
  Filled 2021-04-08 (×7): qty 330

## 2021-04-08 MED ORDER — ZINC SULFATE 220 (50 ZN) MG PO CAPS
220.0000 mg | ORAL_CAPSULE | Freq: Every day | ORAL | Status: DC
  Administered 2021-04-08 – 2021-04-13 (×6): 220 mg via ORAL
  Filled 2021-04-08 (×7): qty 1

## 2021-04-08 NOTE — Progress Notes (Signed)
PROGRESS NOTE  Laura Roberts MBT:[id number] DOB: 1940-09-17 DOA: 04/05/2021 PCP: [name], MD  HPI/Recap of past 24 hours: This is a 80 year old female resident of [place] with history of multiple sclerosis on Tecfidera, paraplegia bedbound with suprapubic catheter in place for neurogenic bladder, diabetes mellitus type 2, multinodular goiter on Tapazole who presented from the ID clinic for surgery consultation for decubitus ulcer she had had extensive work-up in months prior She was found to have leukocytosis due to chronic stage IV sacral decubitus ulcer MRI of the sacrum showed erosion of the sacrum adjacent to a large ulcer as well as 3 cm abscess she was referred to ID who recommended direct admission to hospital for surgical debridement and deep culture, surgery was consulted who took her to the OR today for debridement  Subjective: April 07, 2021: Patient seen and examined at bedside she just came back from operating room f patient underwent debridement and deep tissue culture Patient denies any pain  April 08, 2021: Patient seen and examined at bedside She is feeling much better Complaining of sore throat  Assessment/Plan: Principal Problem:   Chronic osteomyelitis ([place]) Active Problems:   Multiple sclerosis ([place])   Essential hypertension   Paraplegia ([place])   Hypokalemia   Type 2 diabetes mellitus with diabetic neuropathy, with long-term current use of insulin ([place])   Chronic suprapubic catheter ([place])   Decubitus ulcer   Multinodular goiter   Anemia in other chronic diseases classified elsewhere   Chronic respiratory failure ([place])   Pressure injury of skin  Chronic osteomyelitis: Recent MRI shows bony erosion and a 3 cm abscess General surgery was consulted and she underwent wound debridement with a deep tissue culture Infectious disease following  Chronic respiratory failure Patient is on nasal cannula O2  Multiple sclerosis Continue prednisone and  Tecfidera Continue baclofen  Type 2 diabetes mellitus Continue Lantus Holding home Trulicity Continue sliding scale and NovoLog   Hypertension with mild lower extremity edema Continue Lexis and metoprolol  Multinodular goiter Follow-up with Dr. Loanne Drilling Continue Tapazole  Paraplegia secondary to multiple sclerosis Suprapubic catheter is in place Continue duloxetine gabapentin and baclofen  Anemia of chronic disease Hemoglobin is stable Code Status: DNR  Severity of Illness: The appropriate patient status for this patient is INPATIENT. Inpatient status is judged to be reasonable and necessary in order to provide the required intensity of service to ensure the patient's safety. The patient's presenting symptoms, physical exam findings, and initial radiographic and laboratory data in the context of their chronic comorbidities is felt to place them at high risk for further clinical deterioration. Furthermore, it is not anticipated that the patient will be medically stable for discharge from the hospital within 2 midnights of admission. The following factors support the patient status of inpatient.   " Wound culture wound treatment  * I certify that at the point of admission it is my clinical judgment that the patient will require inpatient hospital care spanning beyond 2 midnights from the point of admission due to high intensity of service, high risk for further deterioration and high frequency of surveillance required.*   Family Communication: None at bedside  Disposition Plan: Status is: Inpatient  The patient is from: Skilled nursing facility              Anticipated d/c is to: Skilled nursing facility              Anticipated d/c date is: 04/10/2021  Patient currently not stable for discharge   Barrier to discharge-ongoing treatment for decubitus ulcer    Consultants: Surgery Infectious disease  Procedures: Wound debridement  Antimicrobials: None  DVT  prophylaxis: Lovenox   Objective: Vitals:   04/07/21 1700 04/07/21 2133 04/08/21 0627 04/08/21 1346  BP: 115/68 119/75 114/60 99/60  Pulse: 94 91 89 89  Resp: 16 14 19 18   Temp: 97.9 F (36.6 C) 97.9 F (36.6 C) 98.3 F (36.8 C) 99.1 F (37.3 C)  TempSrc: Oral Oral Oral   SpO2: 100% 98% 100% 90%  Weight:      Height:        Intake/Output Summary (Last 24 hours) at 04/08/2021 1921 Last data filed at 04/08/2021 1400 Gross per 24 hour  Intake --  Output 1000 ml  Net -1000 ml    Filed Weights   04/06/21 1823  Weight: 82.1 kg   Body mass index is 33.1 kg/m.  Exam:  General: 80 y.o. year-old female well developed well nourished in no acute distress.  Alert and oriented x3.  Overweight Cardiovascular: Regular rate and rhythm with no rubs or gallops.  No thyromegaly or JVD noted.   Respiratory: Clear to auscultation with no wheezes or rales. Good inspiratory effort. Abdomen: Soft nontender nondistended with normal bowel sounds x4 quadrants.  Colostomy and colostomy bag in place Musculoskeletal: +1 lower extremity edema. 2/4 pulses in all 4 extremities. Skin: Sacral decubitus ulcer Psychiatry: Mood is appropriate for condition and setting Neurology: Paraplegia alert oriented x3 speech: Slow to answer questions    Data Reviewed: CBC: Recent Labs  Lab 04/05/21 1439 04/06/21 0535 04/07/21 0645  WBC 16.9* 12.5* 11.7*  NEUTROABS 14.9*  --   --   HGB 9.8* 9.4* 8.8*  HCT 31.2* 29.0* 27.5*  MCV 85.7 85.0 85.1  PLT 585* 549* 498*    Basic Metabolic Panel: Recent Labs  Lab 04/05/21 1439 04/06/21 0535 04/07/21 0645  NA 140 139 143  K 3.4* 2.8* 2.9*  CL 96* 99 100  CO2 35* 33* 35*  GLUCOSE 107* 80 99  BUN 15 10 6*  CREATININE 0.42* 0.38* 0.43*  CALCIUM 8.5* 8.2* 8.2*  MG  --  1.8  --     GFR: Estimated Creatinine Clearance: 57.5 mL/min (A) (by C-G formula based on SCr of 0.43 mg/dL (L)). Liver Function Tests: No results for input(s): AST, ALT, ALKPHOS,  BILITOT, PROT, ALBUMIN in the last 168 hours. No results for input(s): LIPASE, AMYLASE in the last 168 hours. No results for input(s): AMMONIA in the last 168 hours. Coagulation Profile: Recent Labs  Lab 04/05/21 1439  INR 1.2    Cardiac Enzymes: No results for input(s): CKTOTAL, CKMB, CKMBINDEX, TROPONINI in the last 168 hours. BNP (last 3 results) No results for input(s): PROBNP in the last 8760 hours. HbA1C: Recent Labs    04/06/21 0535  HGBA1C 5.9*    CBG: Recent Labs  Lab 04/07/21 1432 04/07/21 1627 04/07/21 2134 04/08/21 0730 04/08/21 1608  GLUCAP 104* 178* 135* 104* 105*    Lipid Profile: No results for input(s): CHOL, HDL, LDLCALC, TRIG, CHOLHDL, LDLDIRECT in the last 72 hours. Thyroid Function Tests: No results for input(s): TSH, T4TOTAL, FREET4, T3FREE, THYROIDAB in the last 72 hours. Anemia Panel: No results for input(s): VITAMINB12, FOLATE, FERRITIN, TIBC, IRON, RETICCTPCT in the last 72 hours. Urine analysis:    Component Value Date/Time   COLORURINE YELLOW 11/08/2017 2312   APPEARANCEUR CLOUDY (A) 11/08/2017 2312   LABSPEC 1.011 11/08/2017 2312  PHURINE 6.0 11/08/2017 2312   GLUCOSEU NEGATIVE 11/08/2017 2312   HGBUR LARGE (A) 11/08/2017 2312   BILIRUBINUR NEGATIVE 11/08/2017 2312   KETONESUR NEGATIVE 11/08/2017 2312   PROTEINUR 100 (A) 11/08/2017 2312   UROBILINOGEN 1.0 08/04/2012 1938   NITRITE POSITIVE (A) 11/08/2017 2312   LEUKOCYTESUR LARGE (A) 11/08/2017 2312   Sepsis Labs: @LABRCNTIP (procalcitonin:4,lacticidven:4)  ) Recent Results (from the past 240 hour(s))  Resp Panel by RT-PCR (Flu A&B, Covid) Nasopharyngeal Swab     Status: None   Collection Time: 04/05/21  3:41 PM   Specimen: Nasopharyngeal Swab; Nasopharyngeal(NP) swabs in vial transport medium  Result Value Ref Range Status   SARS Coronavirus 2 by RT PCR NEGATIVE NEGATIVE Final    Comment: (NOTE) SARS-CoV-2 target nucleic acids are NOT DETECTED.  The SARS-CoV-2 RNA is  generally detectable in upper respiratory specimens during the acute phase of infection. The lowest concentration of SARS-CoV-2 viral copies this assay can detect is 138 copies/mL. A negative result does not preclude SARS-Cov-2 infection and should not be used as the sole basis for treatment or other patient management decisions. A negative result may occur with  improper specimen collection/handling, submission of specimen other than nasopharyngeal swab, presence of viral mutation(s) within the areas targeted by this assay, and inadequate number of viral copies(<138 copies/mL). A negative result must be combined with clinical observations, patient history, and epidemiological information. The expected result is Negative.  Fact Sheet for Patients:  EntrepreneurPulse.com.au  Fact Sheet for Healthcare Providers:  IncredibleEmployment.be  This test is no t yet approved or cleared by the [place] FDA and  has been authorized for detection and/or diagnosis of SARS-CoV-2 by FDA under an Emergency Use Authorization (EUA). This EUA will remain  in effect (meaning this test can be used) for the duration of the COVID-19 declaration under Section 564(b)(1) of the Act, 21 U.S.C.section 360bbb-3(b)(1), unless the authorization is terminated  or revoked sooner.       Influenza A by PCR NEGATIVE NEGATIVE Final   Influenza B by PCR NEGATIVE NEGATIVE Final    Comment: (NOTE) The Xpert Xpress SARS-CoV-2/FLU/RSV plus assay is intended as an aid in the diagnosis of influenza from Nasopharyngeal swab specimens and should not be used as a sole basis for treatment. Nasal washings and aspirates are unacceptable for Xpert Xpress SARS-CoV-2/FLU/RSV testing.  Fact Sheet for Patients: EntrepreneurPulse.com.au  Fact Sheet for Healthcare Providers: IncredibleEmployment.be  This test is not yet approved or cleared by the [place] FDA and has been authorized for detection and/or diagnosis of SARS-CoV-2 by FDA under an Emergency Use Authorization (EUA). This EUA will remain in effect (meaning this test can be used) for the duration of the COVID-19 declaration under Section 564(b)(1) of the Act, 21 U.S.C. section 360bbb-3(b)(1), unless the authorization is terminated or revoked.  Performed at [facility], [place] [address] [place], [place] [address]   Surgical pcr screen     Status: Abnormal   Collection Time: 04/07/21  9:24 AM   Specimen: Nasal Mucosa; Nasal Swab  Result Value Ref Range Status   MRSA, PCR POSITIVE (A) NEGATIVE Final    Comment: RESULT CALLED TO, READ BACK BY AND VERIFIED WITH: Clotilde Dieter, RN 04/07/21 1041 KDS    Staphylococcus aureus POSITIVE (A) NEGATIVE Final    Comment: (NOTE) The Xpert SA Assay (FDA approved for NASAL specimens in patients 43 years of age and older), is one component of a comprehensive surveillance program. It is not intended to diagnose  infection nor to guide or monitor treatment. Performed at [facility], [place] [address], [place], [place] [address]   Aerobic/Anaerobic Culture w Gram Stain (surgical/deep wound)     Status: None (Preliminary result)   Collection Time: 04/07/21 12:08 PM   Specimen: PATH Bone resection; Tissue  Result Value Ref Range Status   Specimen Description   Final    SACRAL BONE Performed at [facility] [address], [place], [place] [address]    Special Requests   Final    NONE Performed at [facility], [place] [address], [place], [place] [address]    Gram Stain   Final    FEW SQUAMOUS EPITHELIAL CELLS PRESENT FEW WBC SEEN FEW GRAM NEGATIVE RODS    Culture   Final    CULTURE REINCUBATED FOR BETTER GROWTH Performed at [facility], [place] [address] [place],  [address]    Report Status PENDING  Incomplete      Studies: No results  found.  Scheduled Meds:  (feeding supplement) PROSource Plus  30 mL Oral TID BM   vitamin C  500 mg Oral BID   baclofen  10 mg Oral Q1400   baclofen  15 mg Oral Q breakfast   baclofen  20 mg Oral QHS   Chlorhexidine Gluconate Cloth  6 each Topical Q0600   collagenase   Topical Daily   darifenacin  15 mg Oral Daily   Dimethyl Fumarate  240 mg Oral BID   DULoxetine  90 mg Oral BID   enoxaparin (LOVENOX) injection  40 mg Subcutaneous Q24H   furosemide  20 mg Oral q AM   gabapentin  200 mg Oral TID   insulin aspart  0-15 Units Subcutaneous TID WC   insulin aspart  0-5 Units Subcutaneous QHS   insulin glargine-yfgn  15 Units Subcutaneous QHS   methimazole  5 mg Oral Q0600   metoprolol succinate  50 mg Oral QAC breakfast   multivitamin with minerals  1 tablet Oral Daily   mupirocin ointment  1 application Nasal BID   pantoprazole  40 mg Oral Q0600   predniSONE  2.5 mg Oral Q M,W,F,Su-1800   predniSONE  5 mg Oral Q T,Th,Sat-1800   Ensure Max Protein  11 oz Oral Daily   zinc sulfate  220 mg Oral Daily    Continuous Infusions:  methocarbamol (ROBAXIN) IV       LOS: 3 days     Cristal Deer, MD [facility]  To reach me or the doctor on call, go to: [email or web address] Password [email or web address]  04/08/2021, 7:21 PM

## 2021-04-08 NOTE — Progress Notes (Signed)
Progress Note: General Surgery Service   Chief Complaint/Subjective: Abdomen and throat are sore.  Asking for something for sore throat.  Objective: Vital signs in last 24 hours: Temp:  [97.4 F (36.3 C)-98.3 F (36.8 C)] 98.3 F (36.8 C) (10/09 0627) Pulse Rate:  [89-95] 89 (10/09 0627) Resp:  [12-20] 19 (10/09 0627) BP: (112-122)/(56-75) 114/60 (10/09 0627) SpO2:  [97 %-100 %] 100 % (10/09 0627) Last BM Date: 04/07/21  Intake/Output from previous day: 10/08 0701 - 10/09 0700 In: 1200 [P.O.:50; I.V.:650; IV Piggyback:500] Out: 560 [Urine:550; Blood:10] Intake/Output this shift: No intake/output data recorded.   GI: Abd soft, non-tender, suprapubic catheter in place, left upper quadrant loop colostomy healthy, incisions c/d/I w/ glue Sacral decubitus ulcer with wound vac in place holding seal  Lab Results: CBC  Recent Labs    04/06/21 0535 04/07/21 0645  WBC 12.5* 11.7*  HGB 9.4* 8.8*  HCT 29.0* 27.5*  PLT 549* 498*    BMET Recent Labs    04/06/21 0535 04/07/21 0645  NA 139 143  K 2.8* 2.9*  CL 99 100  CO2 33* 35*  GLUCOSE 80 99  BUN 10 6*  CREATININE 0.38* 0.43*  CALCIUM 8.2* 8.2*    PT/INR Recent Labs    04/05/21 1439  LABPROT 14.8  INR 1.2    ABG Recent Labs    04/06/21 0535  HCO3 33.6*     Anti-infectives: Anti-infectives (From admission, onward)    Start     Dose/Rate Route Frequency Ordered Stop   04/07/21 1059  metroNIDAZOLE (FLAGYL) 500 MG/100ML IVPB       Note to Pharmacy: Laura Roberts   : cabinet override      04/07/21 1059 04/07/21 1152   04/07/21 1059  ceFAZolin (ANCEF) 2-4 GM/100ML-% IVPB       Note to Pharmacy: Laura Roberts   : cabinet override      04/07/21 1059 04/07/21 2314       Medications: Scheduled Meds:  baclofen  10 mg Oral Q1400   baclofen  15 mg Oral Q breakfast   baclofen  20 mg Oral QHS   Chlorhexidine Gluconate Cloth  6 each Topical Q0600   collagenase   Topical Daily   darifenacin  15 mg  Oral Daily   Dimethyl Fumarate  240 mg Oral BID   DULoxetine  90 mg Oral BID   enoxaparin (LOVENOX) injection  40 mg Subcutaneous Q24H   furosemide  20 mg Oral q AM   gabapentin  200 mg Oral TID   insulin aspart  0-15 Units Subcutaneous TID WC   insulin aspart  0-5 Units Subcutaneous QHS   insulin glargine-yfgn  15 Units Subcutaneous QHS   methimazole  5 mg Oral Q0600   metoprolol succinate  50 mg Oral QAC breakfast   mupirocin ointment  1 application Nasal BID   pantoprazole  40 mg Oral Q0600   predniSONE  2.5 mg Oral Q M,W,F,Su-1800   predniSONE  5 mg Oral Q T,Th,Sat-1800   Continuous Infusions:  methocarbamol (ROBAXIN) IV     PRN Meds:.acetaminophen **OR** acetaminophen, bisacodyl, HYDROmorphone (DILAUDID) injection, influenza vaccine adjuvanted, magnesium hydroxide, methocarbamol (ROBAXIN) IV, ondansetron **OR** ondansetron (ZOFRAN) IV, ondansetron, oxyCODONE, oxyCODONE  Assessment/Plan: Laura Roberts is a 80 year old female with paraplegia secondary to [place] who is bedbound and unable to transfer to the toilet for bowel movements and requires care to clean the bed with every bowel movement.  She has a chronic decubitus ulcer, stage 4 with palpable  bone in the base of the wound. On 04/07/21 she underwent excisional debridement of the sacral wound and laparoscopic transverse colostomy.  - Diet as tolerated - WOC consult for ostomy and wound vac care - Pain and nausea control as needed - Remove red rubber at about 7-10 days post op - check lab work tomorrow    LOS: 3 days     Laura Morn, MD  [facility] Use [email or web address] to contact on call provider

## 2021-04-08 NOTE — Progress Notes (Signed)
Initial Nutrition Assessment  DOCUMENTATION CODES:   Obesity unspecified  INTERVENTION:   -Ensure MAX Protein po daily, each supplement provides 150 kcal and 30 grams of protein   -Prosource Plus PO TID, each provides 100 kcals and 15g protein  -Multivitamin with minerals daily  -Vitamin C 500 mg BID -Zinc sulfate 220 mg daily  NUTRITION DIAGNOSIS:   Increased nutrient needs related to wound healing as evidenced by estimated needs.  GOAL:   Patient will meet greater than or equal to 90% of their needs  MONITOR:   PO intake, Supplement acceptance, Labs, Weight trends, Skin, I & O's  REASON FOR ASSESSMENT:   Consult Wound healing  ASSESSMENT:   80 year old female with history of MS on Tecfidera, paraplegia, bedbound with suprapubic catheter in place for neurogenic bladder, diabetes mellitus type 2, multinodular goiter on Tapazole presented from ID clinic for surgery consultation.  She was found to have leukocytosis by MD at [facility] over a month ago.  Extensive work-up done over there, source was thought to be chronic stage IV sacral decubitus ulcer.  MRI sacrum showed erosion of sacrum adjacent to large ulcer as well as 3 cm abscess.  10/8: s/p Sharp debridement of sacral decubitus ulcer using curette and electrocautery, Wound vac application to sacral decubitus ulcer, Laparoscopic creation of loop transverse colostomy  Patient with poor PO. Reports sore throat. Will order Ensure supplements with MVI, Vitamin C and zinc for wound healing.  Per weight records, no significant weight loss noted.  Medications: Lasix  Labs reviewed: CBGs: 69-178 Low K  NUTRITION - FOCUSED PHYSICAL EXAM:  Unable to complete -working remote  Diet Order:   Diet Order             Diet Carb Modified Fluid consistency: Thin; Room service appropriate? Yes  Diet effective now                   EDUCATION NEEDS:   No education needs have been identified at this time  Skin:   Skin Assessment: Skin Integrity Issues: Skin Integrity Issues:: Stage IV Stage IV: sacrum  Last BM:  10/8 -colostomy  Height:   Ht Readings from Last 1 Encounters:  04/06/21 5\' 2"  (1.575 m)    Weight:   Wt Readings from Last 1 Encounters:  04/06/21 82.1 kg    BMI:  Body mass index is 33.1 kg/m.  Estimated Nutritional Needs:   Kcal:  8937-3428  Protein:  85-100g  Fluid:  2L/day   Clayton Bibles, MS, RD, LDN Inpatient Clinical Dietitian Contact information available via Amion

## 2021-04-09 DIAGNOSIS — M8660 Other chronic osteomyelitis, unspecified site: Secondary | ICD-10-CM

## 2021-04-09 LAB — CBC
HCT: 26.5 % — ABNORMAL LOW (ref 36.0–46.0)
Hemoglobin: 8.3 g/dL — ABNORMAL LOW (ref 12.0–15.0)
MCH: 26.9 pg (ref 26.0–34.0)
MCHC: 31.3 g/dL (ref 30.0–36.0)
MCV: 86 fL (ref 80.0–100.0)
Platelets: 472 10*3/uL — ABNORMAL HIGH (ref 150–400)
RBC: 3.08 MIL/uL — ABNORMAL LOW (ref 3.87–5.11)
RDW: 19.9 % — ABNORMAL HIGH (ref 11.5–15.5)
WBC: 13.4 10*3/uL — ABNORMAL HIGH (ref 4.0–10.5)
nRBC: 0 % (ref 0.0–0.2)

## 2021-04-09 LAB — GLUCOSE, CAPILLARY
Glucose-Capillary: 117 mg/dL — ABNORMAL HIGH (ref 70–99)
Glucose-Capillary: 139 mg/dL — ABNORMAL HIGH (ref 70–99)
Glucose-Capillary: 85 mg/dL (ref 70–99)
Glucose-Capillary: 116 mg/dL — ABNORMAL HIGH (ref 70–99)
Glucose-Capillary: 101 mg/dL — ABNORMAL HIGH (ref 70–99)
Glucose-Capillary: 130 mg/dL — ABNORMAL HIGH (ref 70–99)
Glucose-Capillary: 97 mg/dL (ref 70–99)
Glucose-Capillary: 151 mg/dL — ABNORMAL HIGH (ref 70–99)

## 2021-04-09 LAB — BASIC METABOLIC PANEL
Anion gap: 7 (ref 5–15)
BUN: 8 mg/dL (ref 8–23)
CO2: 37 mmol/L — ABNORMAL HIGH (ref 22–32)
Calcium: 8 mg/dL — ABNORMAL LOW (ref 8.9–10.3)
Chloride: 94 mmol/L — ABNORMAL LOW (ref 98–111)
Creatinine, Ser: 0.59 mg/dL (ref 0.44–1.00)
GFR, Estimated: >60 mL/min (ref >60)
Glucose, Bld: 125 mg/dL — ABNORMAL HIGH (ref 70–99)
Potassium: 2.9 mmol/L — ABNORMAL LOW (ref 3.5–5.1)
Sodium: 138 mmol/L (ref 135–145)

## 2021-04-09 MED ORDER — POTASSIUM CHLORIDE 20 MEQ PO PACK
40.0000 meq | PACK | Freq: Two times a day (BID) | ORAL | Status: AC
  Administered 2021-04-09 (×2): 40 meq via ORAL
  Filled 2021-04-09 (×2): qty 2

## 2021-04-09 NOTE — Progress Notes (Signed)
PROGRESS NOTE  Laura Roberts IRW:[id number] DOB: Oct 10, 1940 DOA: 04/05/2021 PCP: [name], MD  HPI/Recap of past 24 hours: This is a 80 year old female resident of [facility] with history of multiple sclerosis on Tecfidera, paraplegia bedbound with suprapubic catheter in place for neurogenic bladder, diabetes mellitus type 2, multinodular goiter on Tapazole who presented from the ID clinic for surgery consultation for decubitus ulcer she had had extensive work-up in months prior She was found to have leukocytosis due to chronic stage IV sacral decubitus ulcer MRI of the sacrum showed erosion of the sacrum adjacent to a large ulcer as well as 3 cm abscess she was referred to ID who recommended direct admission to hospital for surgical debridement and deep culture, surgery was consulted who took her to the OR today for debridement  Subjective: April 07, 2021: Patient seen and examined at bedside she just came back from operating room  patient underwent debridement and deep tissue culture Patient denies any pain  April 08, 2021: Patient seen and examined at bedside She is feeling much better Complaining of sore throat  April 09, 2021: Patient seen and examined at bedside she said she had a good night denies any fever nausea vomiting.  Has some pain  Assessment/Plan: Principal Problem:   Chronic osteomyelitis ([place]) Active Problems:   Multiple sclerosis (HCC)   Essential hypertension   Paraplegia (HCC)   Hypokalemia   Type 2 diabetes mellitus with diabetic neuropathy, with Roberts-term current use of insulin (HCC)   Chronic suprapubic catheter (HCC)   Decubitus ulcer   Multinodular goiter   Anemia in other chronic diseases classified elsewhere   Chronic respiratory failure (HCC)   Pressure injury of skin  Chronic osteomyelitis: Recent MRI shows bony erosion and a 3 cm abscess General surgery was consulted and she underwent wound debridement with a deep tissue  culture Infectious disease following  Chronic respiratory failure Patient is on nasal cannula O2  Multiple sclerosis Continue prednisone and Tecfidera Continue baclofen  Type 2 diabetes mellitus Continue Lantus Holding home Trulicity Continue sliding scale and NovoLog   Hypertension with mild lower extremity edema Continue Lexis and metoprolol  Multinodular goiter Follow-up with Dr. Loanne Drilling Continue Tapazole  Paraplegia secondary to multiple sclerosis Suprapubic catheter is in place Continue duloxetine gabapentin and baclofen  Anemia of chronic disease Hemoglobin is stable Code Status: DNR  Severity of Illness: The appropriate patient status for this patient is INPATIENT. Inpatient status is judged to be reasonable and necessary in order to provide the required intensity of service to ensure the patient's safety. The patient's presenting symptoms, physical exam findings, and initial radiographic and laboratory data in the context of their chronic comorbidities is felt to place them at high risk for further clinical deterioration. Furthermore, it is not anticipated that the patient will be medically stable for discharge from the hospital within 2 midnights of admission. The following factors support the patient status of inpatient.   " Wound culture wound treatment  * I certify that at the point of admission it is my clinical judgment that the patient will require inpatient hospital care spanning beyond 2 midnights from the point of admission due to high intensity of service, high risk for further deterioration and high frequency of surveillance required.*   Family Communication: None at bedside  Disposition Plan: Status is: Inpatient  The patient is from: Skilled nursing facility              Anticipated d/c is to: Skilled nursing  facility              Anticipated d/c date is: 04/10/2021              Patient currently not stable for discharge   Barrier to  discharge-ongoing treatment for decubitus ulcer    Consultants: Surgery Infectious disease  Procedures: Wound debridement  Antimicrobials: None  DVT prophylaxis: Lovenox   Objective: Vitals:   04/08/21 1346 04/08/21 2228 04/09/21 0351 04/09/21 1403  BP: 99/60 (!) 104/53 117/61 105/65  Pulse: 89 91 86 85  Resp: 18 15 15 16   Temp: 99.1 F (37.3 C) 97.8 F (36.6 C) 98 F (36.7 C) 98.3 F (36.8 C)  TempSrc:  Oral Oral Oral  SpO2: 90% 100% 96% 100%  Weight:      Height:        Intake/Output Summary (Last 24 hours) at 04/09/2021 1830 Last data filed at 04/09/2021 1740 Gross per 24 hour  Intake 120 ml  Output 1300 ml  Net -1180 ml    Filed Weights   04/06/21 1823  Weight: 82.1 kg   Body mass index is 33.1 kg/m.  Exam:  General: 80 y.o. year-old female well developed well nourished in no acute distress.  Alert and oriented x3.  Overweight Cardiovascular: Regular rate and rhythm with no rubs or gallops.  No thyromegaly or JVD noted.   Respiratory: Clear to auscultation with no wheezes or rales. Good inspiratory effort. Abdomen: Soft nontender nondistended with normal bowel sounds x4 quadrants.  Colostomy and colostomy bag in place Musculoskeletal: +1 lower extremity edema. 2/4 pulses in all 4 extremities. Skin: Sacral decubitus ulcer patient has a wound VAC Psychiatry: Mood is appropriate for condition and setting affect is flat Neurology: Paraplegia alert oriented x3 speech: Slow to answer questions    Data Reviewed: CBC: Recent Labs  Lab 04/05/21 1439 04/06/21 0535 04/07/21 0645 04/09/21 0453  WBC 16.9* 12.5* 11.7* 13.4*  NEUTROABS 14.9*  --   --   --   HGB 9.8* 9.4* 8.8* 8.3*  HCT 31.2* 29.0* 27.5* 26.5*  MCV 85.7 85.0 85.1 86.0  PLT 585* 549* 498* 472*    Basic Metabolic Panel: Recent Labs  Lab 04/05/21 1439 04/06/21 0535 04/07/21 0645 04/09/21 0453  NA 140 139 143 138  K 3.4* 2.8* 2.9* 2.9*  CL 96* 99 100 94*  CO2 35* 33* 35* 37*   GLUCOSE 107* 80 99 125*  BUN 15 10 6* 8  CREATININE 0.42* 0.38* 0.43* 0.59  CALCIUM 8.5* 8.2* 8.2* 8.0*  MG  --  1.8  --   --     GFR: Estimated Creatinine Clearance: 57.5 mL/min (by C-G formula based on SCr of 0.59 mg/dL). Liver Function Tests: No results for input(s): AST, ALT, ALKPHOS, BILITOT, PROT, ALBUMIN in the last 168 hours. No results for input(s): LIPASE, AMYLASE in the last 168 hours. No results for input(s): AMMONIA in the last 168 hours. Coagulation Profile: Recent Labs  Lab 04/05/21 1439  INR 1.2    Cardiac Enzymes: No results for input(s): CKTOTAL, CKMB, CKMBINDEX, TROPONINI in the last 168 hours. BNP (last 3 results) No results for input(s): PROBNP in the last 8760 hours. HbA1C: No results for input(s): HGBA1C in the last 72 hours.  CBG: Recent Labs  Lab 04/08/21 2225 04/09/21 0339 04/09/21 0742 04/09/21 1211 04/09/21 1734  GLUCAP 143* 116* 101* 130* 97    Lipid Profile: No results for input(s): CHOL, HDL, LDLCALC, TRIG, CHOLHDL, LDLDIRECT in the last 72 hours. Thyroid  Function Tests: No results for input(s): TSH, T4TOTAL, FREET4, T3FREE, THYROIDAB in the last 72 hours. Anemia Panel: No results for input(s): VITAMINB12, FOLATE, FERRITIN, TIBC, IRON, RETICCTPCT in the last 72 hours. Urine analysis:    Component Value Date/Time   COLORURINE YELLOW 11/08/2017 2312   APPEARANCEUR CLOUDY (A) 11/08/2017 2312   LABSPEC 1.011 11/08/2017 2312   PHURINE 6.0 11/08/2017 2312   GLUCOSEU NEGATIVE 11/08/2017 2312   HGBUR LARGE (A) 11/08/2017 2312   BILIRUBINUR NEGATIVE 11/08/2017 2312   KETONESUR NEGATIVE 11/08/2017 2312   PROTEINUR 100 (A) 11/08/2017 2312   UROBILINOGEN 1.0 08/04/2012 1938   NITRITE POSITIVE (A) 11/08/2017 2312   LEUKOCYTESUR LARGE (A) 11/08/2017 2312   Sepsis Labs: @LABRCNTIP (procalcitonin:4,lacticidven:4)  ) Recent Results (from the past 240 hour(s))  Resp Panel by RT-PCR (Flu A&B, Covid) Nasopharyngeal Swab     Status: None    Collection Time: 04/05/21  3:41 PM   Specimen: Nasopharyngeal Swab; Nasopharyngeal(NP) swabs in vial transport medium  Result Value Ref Range Status   SARS Coronavirus 2 by RT PCR NEGATIVE NEGATIVE Final    Comment: (NOTE) SARS-CoV-2 target nucleic acids are NOT DETECTED.  The SARS-CoV-2 RNA is generally detectable in upper respiratory specimens during the acute phase of infection. The lowest concentration of SARS-CoV-2 viral copies this assay can detect is 138 copies/mL. A negative result does not preclude SARS-Cov-2 infection and should not be used as the sole basis for treatment or other patient management decisions. A negative result may occur with  improper specimen collection/handling, submission of specimen other than nasopharyngeal swab, presence of viral mutation(s) within the areas targeted by this assay, and inadequate number of viral copies(<138 copies/mL). A negative result must be combined with clinical observations, patient history, and epidemiological information. The expected result is Negative.  Fact Sheet for Patients:  EntrepreneurPulse.com.au  Fact Sheet for Healthcare Providers:  IncredibleEmployment.be  This test is no t yet approved or cleared by the [place] FDA and  has been authorized for detection and/or diagnosis of SARS-CoV-2 by FDA under an Emergency Use Authorization (EUA). This EUA will remain  in effect (meaning this test can be used) for the duration of the COVID-19 declaration under Section 564(b)(1) of the Act, 21 U.S.C.section 360bbb-3(b)(1), unless the authorization is terminated  or revoked sooner.       Influenza A by PCR NEGATIVE NEGATIVE Final   Influenza B by PCR NEGATIVE NEGATIVE Final    Comment: (NOTE) The Xpert Xpress SARS-CoV-2/FLU/RSV plus assay is intended as an aid in the diagnosis of influenza from Nasopharyngeal swab specimens and should not be used as a sole basis for treatment.  Nasal washings and aspirates are unacceptable for Xpert Xpress SARS-CoV-2/FLU/RSV testing.  Fact Sheet for Patients: EntrepreneurPulse.com.au  Fact Sheet for Healthcare Providers: IncredibleEmployment.be  This test is not yet approved or cleared by the [place] FDA and has been authorized for detection and/or diagnosis of SARS-CoV-2 by FDA under an Emergency Use Authorization (EUA). This EUA will remain in effect (meaning this test can be used) for the duration of the COVID-19 declaration under Section 564(b)(1) of the Act, 21 U.S.C. section 360bbb-3(b)(1), unless the authorization is terminated or revoked.  Performed at [facility], [place] [address], [place], [place] [address]   Surgical pcr screen     Status: Abnormal   Collection Time: 04/07/21  9:24 AM   Specimen: Nasal Mucosa; Nasal Swab  Result Value Ref Range Status   MRSA, PCR POSITIVE (A) NEGATIVE Final  Comment: RESULT CALLED TO, READ BACK BY AND VERIFIED WITH: Clotilde Dieter, RN 04/07/21 1041 KDS    Staphylococcus aureus POSITIVE (A) NEGATIVE Final    Comment: (NOTE) The Xpert SA Assay (FDA approved for NASAL specimens in patients 39 years of age and older), is one component of a comprehensive surveillance program. It is not intended to diagnose infection nor to guide or monitor treatment. Performed at [facility], [place] [address] [place], [place] [address]   Aerobic/Anaerobic Culture w Gram Stain (surgical/deep wound)     Status: None (Preliminary result)   Collection Time: 04/07/21 12:08 PM   Specimen: PATH Bone resection; Tissue  Result Value Ref Range Status   Specimen Description   Final    SACRAL BONE Performed at [facility] [address] [place], [place] [address]    Special Requests   Final    NONE Performed at [facility], [place] [address], [place], [place] [address]    Gram  Stain   Final    FEW SQUAMOUS EPITHELIAL CELLS PRESENT FEW WBC SEEN FEW GRAM NEGATIVE RODS    Culture   Final    ABUNDANT GRAM NEGATIVE RODS CULTURE REINCUBATED FOR BETTER GROWTH Performed at [facility], [place] [address] [place], [place] [address]    Report Status PENDING  Incomplete      Studies: No results found.  Scheduled Meds:  (feeding supplement) PROSource Plus  30 mL Oral TID BM   vitamin C  500 mg Oral BID   baclofen  10 mg Oral Q1400   baclofen  15 mg Oral Q breakfast   baclofen  20 mg Oral QHS   Chlorhexidine Gluconate Cloth  6 each Topical Q0600   collagenase   Topical Daily   darifenacin  15 mg Oral Daily   Dimethyl Fumarate  240 mg Oral BID   DULoxetine  90 mg Oral BID   enoxaparin (LOVENOX) injection  40 mg Subcutaneous Q24H   furosemide  20 mg Oral q AM   gabapentin  200 mg Oral TID   insulin aspart  0-15 Units Subcutaneous TID WC   insulin aspart  0-5 Units Subcutaneous QHS   insulin glargine-yfgn  15 Units Subcutaneous QHS   methimazole  5 mg Oral Q0600   metoprolol succinate  50 mg Oral QAC breakfast   multivitamin with minerals  1 tablet Oral Daily   mupirocin ointment  1 application Nasal BID   pantoprazole  40 mg Oral Q0600   potassium chloride  40 mEq Oral BID   predniSONE  2.5 mg Oral Q M,W,F,Su-1800   predniSONE  5 mg Oral Q T,Th,Sat-1800   Ensure Max Protein  11 oz Oral Daily   zinc sulfate  220 mg Oral Daily    Continuous Infusions:  methocarbamol (ROBAXIN) IV       LOS: 4 days     Cristal Deer, MD [facility]  To reach me or the doctor on call, go to: [email or web address] Password [email or web address]  04/09/2021, 6:30 PM

## 2021-04-09 NOTE — Progress Notes (Signed)
INFECTIOUS DISEASE PROGRESS NOTE  ID: Laura Roberts is a 80 y.o. female with  Principal Problem:   Chronic osteomyelitis ([name]) Active Problems:   Multiple sclerosis ([name])   Essential hypertension   Paraplegia ([place])   Hypokalemia   Type 2 diabetes mellitus with diabetic neuropathy, with long-term current use of insulin (HCC)   Chronic suprapubic catheter ([name])   Decubitus ulcer   Multinodular goiter   Anemia in other chronic diseases classified elsewhere   Chronic respiratory failure ([place])   Pressure injury of skin  Subjective: Resting quietly  Abtx:  Anti-infectives (From admission, onward)    Start     Dose/Rate Route Frequency Ordered Stop   04/07/21 1059  metroNIDAZOLE (FLAGYL) 500 MG/100ML IVPB       Note to Pharmacy: Darlys Gales   : cabinet override      04/07/21 1059 04/07/21 1152   04/07/21 1059  ceFAZolin (ANCEF) 2-4 GM/100ML-% IVPB       Note to Pharmacy: Darlys Gales   : cabinet override      04/07/21 1059 04/07/21 2314       Medications: Scheduled:  (feeding supplement) PROSource Plus  30 mL Oral TID BM   vitamin C  500 mg Oral BID   baclofen  10 mg Oral Q1400   baclofen  15 mg Oral Q breakfast   baclofen  20 mg Oral QHS   Chlorhexidine Gluconate Cloth  6 each Topical Q0600   collagenase   Topical Daily   darifenacin  15 mg Oral Daily   Dimethyl Fumarate  240 mg Oral BID   DULoxetine  90 mg Oral BID   enoxaparin (LOVENOX) injection  40 mg Subcutaneous Q24H   furosemide  20 mg Oral q AM   gabapentin  200 mg Oral TID   insulin aspart  0-15 Units Subcutaneous TID WC   insulin aspart  0-5 Units Subcutaneous QHS   insulin glargine-yfgn  15 Units Subcutaneous QHS   methimazole  5 mg Oral Q0600   metoprolol succinate  50 mg Oral QAC breakfast   multivitamin with minerals  1 tablet Oral Daily   mupirocin ointment  1 application Nasal BID   pantoprazole  40 mg Oral Q0600   potassium chloride  40 mEq Oral BID   predniSONE  2.5 mg Oral Q  M,W,F,Su-1800   predniSONE  5 mg Oral Q T,Th,Sat-1800   Ensure Max Protein  11 oz Oral Daily   zinc sulfate  220 mg Oral Daily    Objective: Vital signs in last 24 hours: Temp:  [97.8 F (36.6 C)-99.1 F (37.3 C)] 98 F (36.7 C) (10/10 0351) Pulse Rate:  [86-91] 86 (10/10 0351) Resp:  [15-18] 15 (10/10 0351) BP: (99-117)/(53-61) 117/61 (10/10 0351) SpO2:  [90 %-100 %] 96 % (10/10 0351)   General appearance: no distress  Lab Results Recent Labs    04/07/21 0645 04/09/21 0453  WBC 11.7* 13.4*  HGB 8.8* 8.3*  HCT 27.5* 26.5*  NA 143 138  K 2.9* 2.9*  CL 100 94*  CO2 35* 37*  BUN 6* 8  CREATININE 0.43* 0.59   Liver Panel No results for input(s): PROT, ALBUMIN, AST, ALT, ALKPHOS, BILITOT, BILIDIR, IBILI in the last 72 hours. Sedimentation Rate No results for input(s): ESRSEDRATE in the last 72 hours. C-Reactive Protein No results for input(s): CRP in the last 72 hours.  Microbiology: Recent Results (from the past 240 hour(s))  Resp Panel by RT-PCR (Flu A&B, Covid) Nasopharyngeal Swab  Status: None   Collection Time: 04/05/21  3:41 PM   Specimen: Nasopharyngeal Swab; Nasopharyngeal(NP) swabs in vial transport medium  Result Value Ref Range Status   SARS Coronavirus 2 by RT PCR NEGATIVE NEGATIVE Final    Comment: (NOTE) SARS-CoV-2 target nucleic acids are NOT DETECTED.  The SARS-CoV-2 RNA is generally detectable in upper respiratory specimens during the acute phase of infection. The lowest concentration of SARS-CoV-2 viral copies this assay can detect is 138 copies/mL. A negative result does not preclude SARS-Cov-2 infection and should not be used as the sole basis for treatment or other patient management decisions. A negative result may occur with  improper specimen collection/handling, submission of specimen other than nasopharyngeal swab, presence of viral mutation(s) within the areas targeted by this assay, and inadequate number of viral copies(<138  copies/mL). A negative result must be combined with clinical observations, patient history, and epidemiological information. The expected result is Negative.  Fact Sheet for Patients:  EntrepreneurPulse.com.au  Fact Sheet for Healthcare Providers:  IncredibleEmployment.be  This test is no t yet approved or cleared by the [place] FDA and  has been authorized for detection and/or diagnosis of SARS-CoV-2 by FDA under an Emergency Use Authorization (EUA). This EUA will remain  in effect (meaning this test can be used) for the duration of the COVID-19 declaration under Section 564(b)(1) of the Act, 21 U.S.C.section 360bbb-3(b)(1), unless the authorization is terminated  or revoked sooner.       Influenza A by PCR NEGATIVE NEGATIVE Final   Influenza B by PCR NEGATIVE NEGATIVE Final    Comment: (NOTE) The Xpert Xpress SARS-CoV-2/FLU/RSV plus assay is intended as an aid in the diagnosis of influenza from Nasopharyngeal swab specimens and should not be used as a sole basis for treatment. Nasal washings and aspirates are unacceptable for Xpert Xpress SARS-CoV-2/FLU/RSV testing.  Fact Sheet for Patients: EntrepreneurPulse.com.au  Fact Sheet for Healthcare Providers: IncredibleEmployment.be  This test is not yet approved or cleared by the [place] FDA and has been authorized for detection and/or diagnosis of SARS-CoV-2 by FDA under an Emergency Use Authorization (EUA). This EUA will remain in effect (meaning this test can be used) for the duration of the COVID-19 declaration under Section 564(b)(1) of the Act, 21 U.S.C. section 360bbb-3(b)(1), unless the authorization is terminated or revoked.  Performed at [facility], [place] [address], [place], [place] [address]   Surgical pcr screen     Status: Abnormal   Collection Time: 04/07/21  9:24 AM   Specimen: Nasal Mucosa; Nasal Swab   Result Value Ref Range Status   MRSA, PCR POSITIVE (A) NEGATIVE Final    Comment: RESULT CALLED TO, READ BACK BY AND VERIFIED WITH: Clotilde Dieter, RN 04/07/21 1041 KDS    Staphylococcus aureus POSITIVE (A) NEGATIVE Final    Comment: (NOTE) The Xpert SA Assay (FDA approved for NASAL specimens in patients 38 years of age and older), is one component of a comprehensive surveillance program. It is not intended to diagnose infection nor to guide or monitor treatment. Performed at [facility], [place] [address], [place], [place] [address]   Aerobic/Anaerobic Culture w Gram Stain (surgical/deep wound)     Status: None (Preliminary result)   Collection Time: 04/07/21 12:08 PM   Specimen: PATH Bone resection; Tissue  Result Value Ref Range Status   Specimen Description   Final    SACRAL BONE Performed at [facility] [address] [place], [place] [address]  Special Requests   Final    NONE Performed at [facility], [place] [address] [place], [place] [address]    Gram Stain   Final    FEW SQUAMOUS EPITHELIAL CELLS PRESENT FEW WBC SEEN FEW GRAM NEGATIVE RODS    Culture   Final    CULTURE REINCUBATED FOR BETTER GROWTH Performed at [facility], [place] [address] [place], [place] [address]    Report Status PENDING  Incomplete    Studies/Results: No results found.   Assessment/Plan:  Sacral Decubitus from MS Debridement 10-8 Colostomy 10-8  Total days of antibiotics: off.  Keys to sacral wound care: WOC, nutrition, offloading at home. Diversion. Judicious use of anbx.  Hold her anbx for now Awaiting her Op Cx (they are growing).          Bobby Rumpf MD, FACP Infectious Diseases (pager) [phone] [email or web address] 04/09/2021, 11:33 AM  LOS: 4 days

## 2021-04-09 NOTE — Care Management Important Message (Signed)
Important Message  Patient Details IM Letter given to the Patient. Name: Laura Roberts MRN: [id number] Date of Birth: September 14, 1940   Medicare Important Message Given:  Yes     Kerin Salen 04/09/2021, 9:30 AM

## 2021-04-09 NOTE — Progress Notes (Signed)
RN came in to check on patient. Pt was awake and anxious, asking to go back to her facility. Pt was reoriented and told that she was at [facility] long after a procedure. Pt stated that she remembered now, but was still very anxious appearing. Pt stated that she feels "woozy". Vitals and blood sugar normal. Provider notified and Q4 neuro checks initiated. Will continue to monitor.

## 2021-04-09 NOTE — Progress Notes (Signed)
2 Days Post-Op   Subjective/Chief Complaint: Pt with some abd pain as expected.    Objective: Vital signs in last 24 hours: Temp:  [97.8 F (36.6 C)-99.1 F (37.3 C)] 98 F (36.7 C) (10/10 0351) Pulse Rate:  [86-91] 86 (10/10 0351) Resp:  [15-18] 15 (10/10 0351) BP: (99-117)/(53-61) 117/61 (10/10 0351) SpO2:  [90 %-100 %] 96 % (10/10 0351) Last BM Date: 04/08/21  Intake/Output from previous day: 10/09 0701 - 10/10 0700 In: -  Out: 900 [Urine:850; Stool:50] Intake/Output this shift: No intake/output data recorded.  PE:  Constitutional: No acute distress, conversant, appears states age. Eyes: Anicteric sclerae, moist conjunctiva, no lid lag Lungs: Clear to auscultation bilaterally, normal respiratory effort CV: regular rate and rhythm, no murmurs, no peripheral edema, pedal pulses 2+ GI: Soft, no masses or hepatosplenomegaly, non-tender to palpation, ostomy pink/patent Skin: No rashes, palpation reveals normal turgor Psychiatric: appropriate judgment and insight, oriented to person, place, and time   Lab Results:  Recent Labs    04/07/21 0645 04/09/21 0453  WBC 11.7* 13.4*  HGB 8.8* 8.3*  HCT 27.5* 26.5*  PLT 498* 472*   BMET Recent Labs    04/07/21 0645 04/09/21 0453  NA 143 138  K 2.9* 2.9*  CL 100 94*  CO2 35* 37*  GLUCOSE 99 125*  BUN 6* 8  CREATININE 0.43* 0.59  CALCIUM 8.2* 8.0*   PT/INR No results for input(s): LABPROT, INR in the last 72 hours. ABG No results for input(s): PHART, HCO3 in the last 72 hours.  Invalid input(s): PCO2, PO2  Studies/Results: No results found.  Anti-infectives: Anti-infectives (From admission, onward)    Start     Dose/Rate Route Frequency Ordered Stop   04/07/21 1059  metroNIDAZOLE (FLAGYL) 500 MG/100ML IVPB       Note to Pharmacy: Darlys Gales   : cabinet override      04/07/21 1059 04/07/21 1152   04/07/21 1059  ceFAZolin (ANCEF) 2-4 GM/100ML-% IVPB       Note to Pharmacy: Darlys Gales   : cabinet  override      04/07/21 1059 04/07/21 2314       Assessment/Plan: s/p Procedure(s): DEBRIDEMENT OF SACRAL DECUBITUS ULCER (N/A) LAPAROSCOPY DIAGNOSTIC; LAPAROSCOPIC CREATION OF TRANSVERSE COLOSTOMY (N/A) APPLICATION OF WOUND VAC Advance diet Mobilize Await ostomy output Mobilize Wound vac change today  LOS: 4 days    Ralene Ok 04/09/2021

## 2021-04-09 NOTE — Consult Note (Signed)
WOC Nurse Consult Note: Reason for Consult:NPWT (VAC) dressing change to sacrum.  LUQ diverting loop colostomy with retention rod in place.  Wound type: Surgical debridement stage 4 sacral pressure injury Pressure Injury POA: Yes Measurement: 9 cm x 5 cm x 4 cm with undermining from 9 to 2 o'clock, extends 0.4 cm  Wound bed: Ruddy red with bone palpable Drainage (amount, consistency, odor) minimal bleeding with dressing change.   Clear mucus noted from rectum as we turn patient.  Bed and linens are changed.  Photo in chart.  Periwound: Denuded skin at 6 o'clock, 0.3 cm x 0.2 cm  x0.1 cm  PEriwound protected with skin prep and barrier ring.  Dressing is bridged to right hip to offload pressure.  Dressing procedure/placement/frequency: Cleanse sacral wound with NS and pat dry.  Skin prep and barrier ring to periwound. Fill wound bed with black foam and bridge with additional foam to hip to offload pressure.  Seal immediately achieved at 125 mmHg.  Change Mon/Wed/Fri.   WOC Nurse ostomy consult note Stoma type/location: LUQ loop colostomy Stomal assessment/size: well budded. 2" cut barrier to 2 1/4" to accommodate retention rod.  Barrier ring to peristomal skin to protect.  Peristomal assessment: intact skin Treatment options for stomal/peristomal skin: barrier ring Output blood tinged liquid only at this time Mucus output from rectum Ostomy pouching: 2pc. 2 3/4" with barrier ring.   Education provided: Discussed twice weekly pouch changes.  Patient unable to participate in care.   Enrolled patient in [facility] program: No  Will follow.  Domenic Moras MSN, RN, FNP-BC CWON Wound, Ostomy, Continence Nurse Pager [phone]

## 2021-04-10 DIAGNOSIS — G35 Multiple sclerosis: Secondary | ICD-10-CM | POA: Diagnosis not present

## 2021-04-10 DIAGNOSIS — L89150 Pressure ulcer of sacral region, unstageable: Secondary | ICD-10-CM | POA: Diagnosis not present

## 2021-04-10 DIAGNOSIS — M8660 Other chronic osteomyelitis, unspecified site: Secondary | ICD-10-CM | POA: Diagnosis not present

## 2021-04-10 LAB — BASIC METABOLIC PANEL
Anion gap: 8 (ref 5–15)
BUN: 12 mg/dL (ref 8–23)
CO2: 36 mmol/L — ABNORMAL HIGH (ref 22–32)
Calcium: 8.1 mg/dL — ABNORMAL LOW (ref 8.9–10.3)
Chloride: 95 mmol/L — ABNORMAL LOW (ref 98–111)
Creatinine, Ser: 0.49 mg/dL (ref 0.44–1.00)
GFR, Estimated: >60 mL/min (ref >60)
Glucose, Bld: 160 mg/dL — ABNORMAL HIGH (ref 70–99)
Potassium: 3.6 mmol/L (ref 3.5–5.1)
Sodium: 139 mmol/L (ref 135–145)

## 2021-04-10 LAB — CBC
HCT: 26.5 % — ABNORMAL LOW (ref 36.0–46.0)
Hemoglobin: 8.4 g/dL — ABNORMAL LOW (ref 12.0–15.0)
MCH: 27.5 pg (ref 26.0–34.0)
MCHC: 31.7 g/dL (ref 30.0–36.0)
MCV: 86.6 fL (ref 80.0–100.0)
Platelets: 487 10*3/uL — ABNORMAL HIGH (ref 150–400)
RBC: 3.06 MIL/uL — ABNORMAL LOW (ref 3.87–5.11)
RDW: 19.8 % — ABNORMAL HIGH (ref 11.5–15.5)
WBC: 16.7 10*3/uL — ABNORMAL HIGH (ref 4.0–10.5)
nRBC: 0 % (ref 0.0–0.2)

## 2021-04-10 LAB — GLUCOSE, CAPILLARY
Glucose-Capillary: 132 mg/dL — ABNORMAL HIGH (ref 70–99)
Glucose-Capillary: 65 mg/dL — ABNORMAL LOW (ref 70–99)
Glucose-Capillary: 94 mg/dL (ref 70–99)
Glucose-Capillary: 96 mg/dL (ref 70–99)
Glucose-Capillary: 122 mg/dL — ABNORMAL HIGH (ref 70–99)

## 2021-04-10 MED ORDER — GLUCERNA SHAKE PO LIQD
237.0000 mL | Freq: Two times a day (BID) | ORAL | Status: DC
  Administered 2021-04-10 – 2021-04-13 (×7): 237 mL via ORAL
  Filled 2021-04-10 (×9): qty 237

## 2021-04-10 NOTE — Progress Notes (Signed)
Patient ID: Laura Roberts, female   DOB: March 30, 1941, 80 y.o.   MRN: [id number] [facility] Surgery Progress Note  3 Days Post-Op  Subjective: CC-  No complaints this morning. Denies abdominal pain, n/v. Tolerating diet but not eating a lot. Ostomy productive.  Objective: Vital signs in last 24 hours: Temp:  [98.3 F (36.8 C)-98.6 F (37 C)] 98.4 F (36.9 C) (10/11 0614) Pulse Rate:  [85-93] 93 (10/11 0614) Resp:  [16] 16 (10/11 0614) BP: (105-146)/(65-100) 114/66 (10/11 0614) SpO2:  [99 %-100 %] 100 % (10/11 0614) Last BM Date: 04/09/21  Intake/Output from previous day: 10/10 0701 - 10/11 0700 In: 120 [P.O.:120] Out: 1400 [Urine:1275; Stool:125] Intake/Output this shift: No intake/output data recorded.  PE: Gen:  Alert, NAD, pleasant Pulm:  rate and effort normal Abd: Soft, NT/ND, +BS, incisions cdi, ostomy pink with liquid brown stool in pouch  Lab Results:  Recent Labs    04/09/21 0453 04/10/21 0501  WBC 13.4* 16.7*  HGB 8.3* 8.4*  HCT 26.5* 26.5*  PLT 472* 487*   BMET Recent Labs    04/09/21 0453 04/10/21 0501  NA 138 139  K 2.9* 3.6  CL 94* 95*  CO2 37* 36*  GLUCOSE 125* 160*  BUN 8 12  CREATININE 0.59 0.49  CALCIUM 8.0* 8.1*   PT/INR No results for input(s): LABPROT, INR in the last 72 hours. CMP     Component Value Date/Time   NA 139 04/10/2021 0501   NA 144 03/16/2020 0000   NA 144 03/16/2020 0000   K 3.6 04/10/2021 0501   CL 95 (L) 04/10/2021 0501   CO2 36 (H) 04/10/2021 0501   GLUCOSE 160 (H) 04/10/2021 0501   BUN 12 04/10/2021 0501   BUN 14 03/16/2020 0000   BUN 14 03/16/2020 0000   CREATININE 0.49 04/10/2021 0501   CALCIUM 8.1 (L) 04/10/2021 0501   PROT 6.3 12/07/2019 1103   ALBUMIN 3.5 (L) 12/07/2019 1103   AST 11 12/07/2019 1103   ALT 15 12/07/2019 1103   ALKPHOS 74 12/07/2019 1103   BILITOT 0.3 12/07/2019 1103   GFRNONAA >60 04/10/2021 0501   GFRAA 90 03/16/2020 0000   GFRAA >90 03/16/2020 0000   Lipase  No  results found for: LIPASE     Studies/Results: No results found.  Anti-infectives: Anti-infectives (From admission, onward)    Start     Dose/Rate Route Frequency Ordered Stop   04/07/21 1059  metroNIDAZOLE (FLAGYL) 500 MG/100ML IVPB       Note to Pharmacy: Darlys Gales   : cabinet override      04/07/21 1059 04/07/21 1152   04/07/21 1059  ceFAZolin (ANCEF) 2-4 GM/100ML-% IVPB       Note to Pharmacy: Darlys Gales   : cabinet override      04/07/21 1059 04/07/21 2314        Assessment/Plan Sacral decubitus ulcer - stage IV Osteomyelitis -POD#3 S/p Sharp debridement of sacral decubitus ulcer using curette and electrocautery; Wound vac application to sacral decubitus ulcer; Laparoscopic creation of loop transverse colostomy 10/8 Dr. Thermon Leyland - ostomy productive. Plan to d/c red rubber 7-10 days postop - CM diet, add glucerna - wound vac MWF - cultures pending, abx per ID   FEN: CM diet, glucerna ID: none currently. WBC up 16.7, afebrile, monitor VTE: lovenox   T2DM GERD HTN Neurogenic bowel Paraplegia   LOS: 5 days    Wellington Hampshire, [facility] Surgery 04/10/2021, 8:11 AM Please see Amion for pager  number during day hours 7:00am-4:30pm

## 2021-04-10 NOTE — Progress Notes (Signed)
[facility] for Infectious Disease  Date of Admission:  04/05/2021           Reason for visit: Follow up on sacral decubitus ulcer  Current antibiotics: None   Previous antibiotics: Cefazolin x 1 dose 10/8 MTZ x 1 dose 10/8  ASSESSMENT:    80 y.o. female admitted with:  Infected sacral decubitus ulcer and osteomyelitis: s/p debridement and colostomy with surgery 04/07/21.  Bone cultures from the OR with Gram negative rods (identification pending) and she is currently off antibiotics.  Of note, she was on vancomycin outpatient from ~9/23-10/6/22 MS Diabetes: A1c is 5.9.  RECOMMENDATIONS:    Continue holding antibiotics pending culture identification Follow up cultures WOC, nutrition, offloading, diversion Will follow   Principal Problem:   Chronic osteomyelitis ([facility]) Active Problems:   Multiple sclerosis ([place])   Essential hypertension   Paraplegia (HCC)   Hypokalemia   Type 2 diabetes mellitus with diabetic neuropathy, with long-term current use of insulin (HCC)   Chronic suprapubic catheter (HCC)   Decubitus ulcer   Multinodular goiter   Anemia in other chronic diseases classified elsewhere   Chronic respiratory failure (HCC)   Pressure injury of skin    MEDICATIONS:    Scheduled Meds: . (feeding supplement) PROSource Plus  30 mL Oral TID BM  . vitamin C  500 mg Oral BID  . baclofen  10 mg Oral Q1400  . baclofen  15 mg Oral Q breakfast  . baclofen  20 mg Oral QHS  . Chlorhexidine Gluconate Cloth  6 each Topical Q0600  . collagenase   Topical Daily  . darifenacin  15 mg Oral Daily  . Dimethyl Fumarate  240 mg Oral BID  . DULoxetine  90 mg Oral BID  . enoxaparin (LOVENOX) injection  40 mg Subcutaneous Q24H  . feeding supplement (GLUCERNA SHAKE)  237 mL Oral BID BM  . furosemide  20 mg Oral q AM  . gabapentin  200 mg Oral TID  . insulin aspart  0-15 Units Subcutaneous TID WC  . insulin aspart  0-5 Units Subcutaneous QHS  . insulin glargine-yfgn   15 Units Subcutaneous QHS  . methimazole  5 mg Oral Q0600  . metoprolol succinate  50 mg Oral QAC breakfast  . multivitamin with minerals  1 tablet Oral Daily  . mupirocin ointment  1 application Nasal BID  . pantoprazole  40 mg Oral Q0600  . predniSONE  2.5 mg Oral Q M,W,F,Su-1800  . predniSONE  5 mg Oral Q T,Th,Sat-1800  . Ensure Max Protein  11 oz Oral Daily  . zinc sulfate  220 mg Oral Daily   Continuous Infusions: . methocarbamol (ROBAXIN) IV     PRN Meds:.acetaminophen **OR** acetaminophen, bisacodyl, HYDROmorphone (DILAUDID) injection, influenza vaccine adjuvanted, magnesium hydroxide, methocarbamol (ROBAXIN) IV, ondansetron **OR** ondansetron (ZOFRAN) IV, ondansetron, oxyCODONE, oxyCODONE, phenol  SUBJECTIVE:   24 hour events:  No acute events OR cultures with GNRs Afebrile WBC increased   No fevers, chills. Nausea earlier this morning now improved.  No other acute complaints.   Review of Systems  All other systems reviewed and are negative.    OBJECTIVE:   Blood pressure 114/66, pulse 93, temperature 98.4 F (36.9 C), temperature source Oral, resp. rate 16, height 5\' 2"  (1.575 m), weight 82.1 kg, SpO2 100 %. Body mass index is 33.1 kg/m.  Physical Exam Constitutional:      Comments: Chronically ill appearing woman, lying in bed, NAD.   Pulmonary:  Effort: Pulmonary effort is normal. No respiratory distress.  Abdominal:     General: There is no distension.     Palpations: Abdomen is soft.     Comments: Ostomy in place.   Musculoskeletal:     Comments: Wound vac in place for sacral wound.   Neurological:     General: No focal deficit present.     Mental Status: She is oriented to person, place, and time.  Psychiatric:        Mood and Affect: Mood normal.        Behavior: Behavior normal.     Lab Results: Lab Results  Component Value Date   WBC 16.7 (H) 04/10/2021   HGB 8.4 (L) 04/10/2021   HCT 26.5 (L) 04/10/2021   MCV 86.6 04/10/2021   PLT  487 (H) 04/10/2021    Lab Results  Component Value Date   NA 139 04/10/2021   K 3.6 04/10/2021   CO2 36 (H) 04/10/2021   GLUCOSE 160 (H) 04/10/2021   BUN 12 04/10/2021   CREATININE 0.49 04/10/2021   CALCIUM 8.1 (L) 04/10/2021   GFRNONAA >60 04/10/2021   GFRAA 90 03/16/2020   GFRAA >90 03/16/2020    Lab Results  Component Value Date   ALT 15 12/07/2019   AST 11 12/07/2019   ALKPHOS 74 12/07/2019   BILITOT 0.3 12/07/2019       Component Value Date/Time   CRP 7.6 (H) 04/05/2021 1439       Component Value Date/Time   ESRSEDRATE 88 (H) 04/05/2021 1439     I have reviewed the micro and lab results in Epic.  Imaging: No results found.   Imaging independently reviewed in Epic.    Rayn[facility] for Infectious Disease [facility] [phone] pager 04/10/2021, 8:45 AM  I spent greater than 35 minutes with the patient including greater than 50% of time in face to face counsel of the patient and in coordination of their care.

## 2021-04-10 NOTE — Progress Notes (Signed)
PROGRESS NOTE  Laura Roberts:[id number] DOB: 03/09/1941 DOA: 04/05/2021 PCP: [name], MD  HPI/Recap of past 24 hours: This is a 80 year old female resident of [facility] with history of multiple sclerosis on Tecfidera, paraplegia bedbound with suprapubic catheter in place for neurogenic bladder, diabetes mellitus type 2, multinodular goiter on Tapazole who presented from the ID clinic for surgery consultation for decubitus ulcer she had had extensive work-up in months prior She was found to have leukocytosis due to chronic stage IV sacral decubitus ulcer MRI of the sacrum showed erosion of the sacrum adjacent to a large ulcer as well as 3 cm abscess she was referred to ID who recommended direct admission to hospital for surgical debridement and deep culture, surgery was consulted who took her to the OR today for debridement  Subjective: April 07, 2021: Patient seen and examined at bedside she just came back from operating room  patient underwent debridement and deep tissue culture Patient denies any pain  April 08, 2021: Patient seen and examined at bedside She is feeling much better Complaining of sore throat  April 09, 2021: Patient seen and examined at bedside she said she had a good night denies any fever nausea vomiting.  Has some pain  April 10, 2021: Patient seen and examined at bedside She is complaining of shaking from her MS.  Continue baclofen  we are still waiting for her wound culture infectious disease and surgery still following  Assessment/Plan: Principal Problem:   Chronic osteomyelitis ([place]) Active Problems:   Multiple sclerosis ([place])   Essential hypertension   Paraplegia (HCC)   Hypokalemia   Type 2 diabetes mellitus with diabetic neuropathy, with long-term current use of insulin (HCC)   Chronic suprapubic catheter ([place])   Decubitus ulcer   Multinodular goiter   Anemia in other chronic diseases classified elsewhere   Chronic respiratory  failure (HCC)   Pressure injury of skin  Chronic osteomyelitis: Recent MRI shows bony erosion and a 3 cm abscess General surgery was consulted and she underwent wound debridement with a deep tissue culture Infectious disease following  Chronic respiratory failure Patient is on nasal cannula O2  Multiple sclerosis Continue prednisone and Tecfidera Continue baclofen  Type 2 diabetes mellitus Continue Lantus Holding home Trulicity Continue sliding scale and NovoLog   Hypertension with mild lower extremity edema Continue Lexis and metoprolol  Multinodular goiter Follow-up with Dr. Loanne Drilling Continue Tapazole  Paraplegia secondary to multiple sclerosis Suprapubic catheter is in place Continue duloxetine gabapentin and baclofen  Anemia of chronic disease Hemoglobin is stable  Leukocytosis.  Patient is afebrile Her white count is elevated more than yesterday Antibiotic is still on hold.  Infectious disease  Code Status: DNR  Severity of Illness: The appropriate patient status for this patient is INPATIENT. Inpatient status is judged to be reasonable and necessary in order to provide the required intensity of service to ensure the patient's safety. The patient's presenting symptoms, physical exam findings, and initial radiographic and laboratory data in the context of their chronic comorbidities is felt to place them at high risk for further clinical deterioration. Furthermore, it is not anticipated that the patient will be medically stable for discharge from the hospital within 2 midnights of admission. The following factors support the patient status of inpatient.   " Wound culture wound treatment  * I certify that at the point of admission it is my clinical judgment that the patient will require inpatient hospital care spanning beyond 2 midnights from the point  of admission due to high intensity of service, high risk for further deterioration and high frequency of surveillance  required.*   Family Communication: None at bedside  Disposition Plan: Status is: Inpatient  The patient is from: Skilled nursing facility              Anticipated d/c is to: Skilled nursing facility              Anticipated d/c date is: 04/10/2021              Patient currently not stable for discharge   Barrier to discharge-ongoing treatment for decubitus ulcer    Consultants: Surgery Infectious disease  Procedures: Wound debridement  Antimicrobials: None  DVT prophylaxis: Lovenox   Objective: Vitals:   04/09/21 1403 04/09/21 2141 04/10/21 0614 04/10/21 1324  BP: 105/65 (!) 146/100 114/66 (!) 126/93  Pulse: 85 88 93 89  Resp: 16 16 16 18   Temp: 98.3 F (36.8 C) 98.6 F (37 C) 98.4 F (36.9 C) 98.8 F (37.1 C)  TempSrc: Oral Oral Oral Oral  SpO2: 100% 99% 100% 97%  Weight:      Height:        Intake/Output Summary (Last 24 hours) at 04/10/2021 1716 Last data filed at 04/10/2021 0921 Gross per 24 hour  Intake 120 ml  Output 1400 ml  Net -1280 ml    Filed Weights   04/06/21 1823  Weight: 82.1 kg   Body mass index is 33.1 kg/m.  Exam:  General: 80 y.o. year-old female well developed well nourished in no acute distress.  Alert and oriented x3.  Overweight Cardiovascular: Regular rate and rhythm with no rubs or gallops.  No thyromegaly or JVD noted.   Respiratory: Clear to auscultation with no wheezes or rales. Good inspiratory effort. Abdomen: Soft nontender nondistended with normal bowel sounds x4 quadrants.  Colostomy and colostomy bag in place Musculoskeletal: +1 lower extremity edema. 2/4 pulses in all 4 extremities. Skin: Sacral decubitus ulcer patient has a wound VAC Psychiatry: Mood is appropriate for condition and setting affect is flat Neurology: Paraplegia alert oriented x3 speech: Slow to answer questions    Data Reviewed: CBC: Recent Labs  Lab 04/05/21 1439 04/06/21 0535 04/07/21 0645 04/09/21 0453 04/10/21 0501  WBC 16.9*  12.5* 11.7* 13.4* 16.7*  NEUTROABS 14.9*  --   --   --   --   HGB 9.8* 9.4* 8.8* 8.3* 8.4*  HCT 31.2* 29.0* 27.5* 26.5* 26.5*  MCV 85.7 85.0 85.1 86.0 86.6  PLT 585* 549* 498* 472* 487*    Basic Metabolic Panel: Recent Labs  Lab 04/05/21 1439 04/06/21 0535 04/07/21 0645 04/09/21 0453 04/10/21 0501  NA 140 139 143 138 139  K 3.4* 2.8* 2.9* 2.9* 3.6  CL 96* 99 100 94* 95*  CO2 35* 33* 35* 37* 36*  GLUCOSE 107* 80 99 125* 160*  BUN 15 10 6* 8 12  CREATININE 0.42* 0.38* 0.43* 0.59 0.49  CALCIUM 8.5* 8.2* 8.2* 8.0* 8.1*  MG  --  1.8  --   --   --     GFR: Estimated Creatinine Clearance: 57.5 mL/min (by C-G formula based on SCr of 0.49 mg/dL). Liver Function Tests: No results for input(s): AST, ALT, ALKPHOS, BILITOT, PROT, ALBUMIN in the last 168 hours. No results for input(s): LIPASE, AMYLASE in the last 168 hours. No results for input(s): AMMONIA in the last 168 hours. Coagulation Profile: Recent Labs  Lab 04/05/21 1439  INR 1.2    Cardiac  Enzymes: No results for input(s): CKTOTAL, CKMB, CKMBINDEX, TROPONINI in the last 168 hours. BNP (last 3 results) No results for input(s): PROBNP in the last 8760 hours. HbA1C: No results for input(s): HGBA1C in the last 72 hours.  CBG: Recent Labs  Lab 04/09/21 2143 04/10/21 0812 04/10/21 1133 04/10/21 1205 04/10/21 1707  GLUCAP 151* 132* 65* 94 96    Lipid Profile: No results for input(s): CHOL, HDL, LDLCALC, TRIG, CHOLHDL, LDLDIRECT in the last 72 hours. Thyroid Function Tests: No results for input(s): TSH, T4TOTAL, FREET4, T3FREE, THYROIDAB in the last 72 hours. Anemia Panel: No results for input(s): VITAMINB12, FOLATE, FERRITIN, TIBC, IRON, RETICCTPCT in the last 72 hours. Urine analysis:    Component Value Date/Time   COLORURINE YELLOW 11/08/2017 2312   APPEARANCEUR CLOUDY (A) 11/08/2017 2312   LABSPEC 1.011 11/08/2017 2312   PHURINE 6.0 11/08/2017 2312   GLUCOSEU NEGATIVE 11/08/2017 2312   HGBUR LARGE (A)  11/08/2017 2312   BILIRUBINUR NEGATIVE 11/08/2017 2312   KETONESUR NEGATIVE 11/08/2017 2312   PROTEINUR 100 (A) 11/08/2017 2312   UROBILINOGEN 1.0 08/04/2012 1938   NITRITE POSITIVE (A) 11/08/2017 2312   LEUKOCYTESUR LARGE (A) 11/08/2017 2312   Sepsis Labs: @LABRCNTIP (procalcitonin:4,lacticidven:4)  ) Recent Results (from the past 240 hour(s))  Resp Panel by RT-PCR (Flu A&B, Covid) Nasopharyngeal Swab     Status: None   Collection Time: 04/05/21  3:41 PM   Specimen: Nasopharyngeal Swab; Nasopharyngeal(NP) swabs in vial transport medium  Result Value Ref Range Status   SARS Coronavirus 2 by RT PCR NEGATIVE NEGATIVE Final    Comment: (NOTE) SARS-CoV-2 target nucleic acids are NOT DETECTED.  The SARS-CoV-2 RNA is generally detectable in upper respiratory specimens during the acute phase of infection. The lowest concentration of SARS-CoV-2 viral copies this assay can detect is 138 copies/mL. A negative result does not preclude SARS-Cov-2 infection and should not be used as the sole basis for treatment or other patient management decisions. A negative result may occur with  improper specimen collection/handling, submission of specimen other than nasopharyngeal swab, presence of viral mutation(s) within the areas targeted by this assay, and inadequate number of viral copies(<138 copies/mL). A negative result must be combined with clinical observations, patient history, and epidemiological information. The expected result is Negative.  Fact Sheet for Patients:  EntrepreneurPulse.com.au  Fact Sheet for Healthcare Providers:  IncredibleEmployment.be  This test is no t yet approved or cleared by the [place] FDA and  has been authorized for detection and/or diagnosis of SARS-CoV-2 by FDA under an Emergency Use Authorization (EUA). This EUA will remain  in effect (meaning this test can be used) for the duration of the COVID-19 declaration under  Section 564(b)(1) of the Act, 21 U.S.C.section 360bbb-3(b)(1), unless the authorization is terminated  or revoked sooner.       Influenza A by PCR NEGATIVE NEGATIVE Final   Influenza B by PCR NEGATIVE NEGATIVE Final    Comment: (NOTE) The Xpert Xpress SARS-CoV-2/FLU/RSV plus assay is intended as an aid in the diagnosis of influenza from Nasopharyngeal swab specimens and should not be used as a sole basis for treatment. Nasal washings and aspirates are unacceptable for Xpert Xpress SARS-CoV-2/FLU/RSV testing.  Fact Sheet for Patients: EntrepreneurPulse.com.au  Fact Sheet for Healthcare Providers: IncredibleEmployment.be  This test is not yet approved or cleared by the [place] FDA and has been authorized for detection and/or diagnosis of SARS-CoV-2 by FDA under an Emergency Use Authorization (EUA). This EUA will remain in effect (meaning this test  can be used) for the duration of the COVID-19 declaration under Section 564(b)(1) of the Act, 21 U.S.C. section 360bbb-3(b)(1), unless the authorization is terminated or revoked.  Performed at [facility], [place] [address], [place], [place] [address]   Surgical pcr screen     Status: Abnormal   Collection Time: 04/07/21  9:24 AM   Specimen: Nasal Mucosa; Nasal Swab  Result Value Ref Range Status   MRSA, PCR POSITIVE (A) NEGATIVE Final    Comment: RESULT CALLED TO, READ BACK BY AND VERIFIED WITH: Clotilde Dieter, RN 04/07/21 1041 KDS    Staphylococcus aureus POSITIVE (A) NEGATIVE Final    Comment: (NOTE) The Xpert SA Assay (FDA approved for NASAL specimens in patients 105 years of age and older), is one component of a comprehensive surveillance program. It is not intended to diagnose infection nor to guide or monitor treatment. Performed at [facility], [place] [address], [place], [place] [address]   Aerobic/Anaerobic Culture w Gram Stain (surgical/deep  wound)     Status: None (Preliminary result)   Collection Time: 04/07/21 12:08 PM   Specimen: PATH Bone resection; Tissue  Result Value Ref Range Status   Specimen Description   Final    SACRAL BONE Performed at [place] [address] [place], [place] [address]    Special Requests   Final    NONE Performed at [facility], [place] [address] [place], [place] [address]    Gram Stain   Final    FEW SQUAMOUS EPITHELIAL CELLS PRESENT FEW WBC SEEN FEW GRAM NEGATIVE RODS    Culture   Final    ABUNDANT GRAM NEGATIVE RODS CULTURE REINCUBATED FOR BETTER GROWTH Performed at [facility], [place] [address] [place], [place] [address]    Report Status PENDING  Incomplete      Studies: No results found.  Scheduled Meds:  (feeding supplement) PROSource Plus  30 mL Oral TID BM   vitamin C  500 mg Oral BID   baclofen  10 mg Oral Q1400   baclofen  15 mg Oral Q breakfast   baclofen  20 mg Oral QHS   Chlorhexidine Gluconate Cloth  6 each Topical Q0600   collagenase   Topical Daily   darifenacin  15 mg Oral Daily   Dimethyl Fumarate  240 mg Oral BID   DULoxetine  90 mg Oral BID   enoxaparin (LOVENOX) injection  40 mg Subcutaneous Q24H   feeding supplement (GLUCERNA SHAKE)  237 mL Oral BID BM   furosemide  20 mg Oral q AM   gabapentin  200 mg Oral TID   insulin aspart  0-15 Units Subcutaneous TID WC   insulin aspart  0-5 Units Subcutaneous QHS   insulin glargine-yfgn  15 Units Subcutaneous QHS   methimazole  5 mg Oral Q0600   metoprolol succinate  50 mg Oral QAC breakfast   multivitamin with minerals  1 tablet Oral Daily   mupirocin ointment  1 application Nasal BID   pantoprazole  40 mg Oral Q0600   predniSONE  2.5 mg Oral Q M,W,F,Su-1800   predniSONE  5 mg Oral Q T,Th,Sat-1800   Ensure Max Protein  11 oz Oral Daily   zinc sulfate  220 mg Oral Daily    Continuous Infusions:  methocarbamol (ROBAXIN) IV       LOS: 5 days      Cristal Deer, MD [facility]  To reach me or the doctor on  call, go to: [email or web address] Password [facility]  04/10/2021, 5:16 PM

## 2021-04-11 ENCOUNTER — Encounter (HOSPITAL_COMMUNITY): Payer: Self-pay | Admitting: Surgery

## 2021-04-11 DIAGNOSIS — M8660 Other chronic osteomyelitis, unspecified site: Secondary | ICD-10-CM | POA: Diagnosis not present

## 2021-04-11 DIAGNOSIS — L89150 Pressure ulcer of sacral region, unstageable: Secondary | ICD-10-CM | POA: Diagnosis not present

## 2021-04-11 DIAGNOSIS — E1140 Type 2 diabetes mellitus with diabetic neuropathy, unspecified: Secondary | ICD-10-CM

## 2021-04-11 DIAGNOSIS — G35 Multiple sclerosis: Secondary | ICD-10-CM | POA: Diagnosis not present

## 2021-04-11 DIAGNOSIS — M4628 Osteomyelitis of vertebra, sacral and sacrococcygeal region: Secondary | ICD-10-CM | POA: Diagnosis not present

## 2021-04-11 DIAGNOSIS — Z794 Long term (current) use of insulin: Secondary | ICD-10-CM

## 2021-04-11 DIAGNOSIS — D638 Anemia in other chronic diseases classified elsewhere: Secondary | ICD-10-CM | POA: Diagnosis not present

## 2021-04-11 DIAGNOSIS — J9612 Chronic respiratory failure with hypercapnia: Secondary | ICD-10-CM | POA: Diagnosis not present

## 2021-04-11 LAB — GLUCOSE, CAPILLARY
Glucose-Capillary: 63 mg/dL — ABNORMAL LOW (ref 70–99)
Glucose-Capillary: 88 mg/dL (ref 70–99)
Glucose-Capillary: 141 mg/dL — ABNORMAL HIGH (ref 70–99)
Glucose-Capillary: 135 mg/dL — ABNORMAL HIGH (ref 70–99)
Glucose-Capillary: 172 mg/dL — ABNORMAL HIGH (ref 70–99)

## 2021-04-11 LAB — CBC
HCT: 27 % — ABNORMAL LOW (ref 36.0–46.0)
Hemoglobin: 8.5 g/dL — ABNORMAL LOW (ref 12.0–15.0)
MCH: 27.1 pg (ref 26.0–34.0)
MCHC: 31.5 g/dL (ref 30.0–36.0)
MCV: 86 fL (ref 80.0–100.0)
Platelets: 490 10*3/uL — ABNORMAL HIGH (ref 150–400)
RBC: 3.14 MIL/uL — ABNORMAL LOW (ref 3.87–5.11)
RDW: 19.7 % — ABNORMAL HIGH (ref 11.5–15.5)
WBC: 15.6 10*3/uL — ABNORMAL HIGH (ref 4.0–10.5)
nRBC: 0 % (ref 0.0–0.2)

## 2021-04-11 LAB — BASIC METABOLIC PANEL
Anion gap: 6 (ref 5–15)
BUN: 9 mg/dL (ref 8–23)
CO2: 41 mmol/L — ABNORMAL HIGH (ref 22–32)
Calcium: 8.8 mg/dL — ABNORMAL LOW (ref 8.9–10.3)
Chloride: 94 mmol/L — ABNORMAL LOW (ref 98–111)
Creatinine, Ser: 0.45 mg/dL (ref 0.44–1.00)
GFR, Estimated: >60 mL/min (ref >60)
Glucose, Bld: 84 mg/dL (ref 70–99)
Potassium: 3.7 mmol/L (ref 3.5–5.1)
Sodium: 141 mmol/L (ref 135–145)

## 2021-04-11 MED ORDER — SODIUM CHLORIDE 0.9 % IV SOLN
1.0000 g | INTRAVENOUS | Status: DC
  Administered 2021-04-11 – 2021-04-13 (×3): 1000 mg via INTRAVENOUS
  Filled 2021-04-11 (×3): qty 1

## 2021-04-11 MED ORDER — CHLORHEXIDINE GLUCONATE CLOTH 2 % EX PADS
6.0000 | MEDICATED_PAD | Freq: Every day | CUTANEOUS | Status: DC
  Administered 2021-04-11 – 2021-04-13 (×3): 6 via TOPICAL

## 2021-04-11 NOTE — Consult Note (Signed)
[facility] Nurse wound follow up Wound type: Debrided Stage 4 pressure injury to sacrum Measurement:7cm x 6cm x 3cm with undermining from 9-2 o'clock measuring 2.5-4cm Wound bed: beefy red Drainage (amount, consistency, odor) serosanguinous Periwound: several areas of medical adhesive related skin injury (MARSI), the largest of which measures 1.8cm x 1.5cm. Dressing procedure/placement/frequency: Dressing removed and two pieces of black foam retrieved. Wound cleansed with NS, measured and periwound skin protected with several layers of drape. One piece of black foam used to obliterate dead space, this is covered with drape.  One piece of black foam is used to bridge the dressing to the right hip after first putting down a protective layer of drape upon which it would rest. This is also covered with drape and the T.R.A.C.C. pad is attached.  Therapy is initiated at 144mHg continuous negative pressure and an immediate seal is attained.  Note: two pieces of black foam were used.  The dressing is labeled.  A dressing kit is in the room for the Friday dressing change.  CCS PA-C B. Meuth was present for the wound assessment and photographed the wound for the EHR.  I will provide a mattress replacement and bilateral pressure redistribution heel boots today.  I recommend a pressure redistribution mattress at the SNF upon discharge.   [facility] Stoma type/location: LUQ loop colostomy with red rubber catheter retention rod. Pouch with undermining beneath skin barrier. Stomal assessment/size: pink, moist, edematous, raised.1 and 3/4 inch x 2 and 1/4 inch oval Peristomal assessment: intact Treatment options for stomal/peristomal skin: skin barrier ring Output: liquid light brown stool Ostomy pouching: 2pc. 2 and 3/4 inch ostomy pouching system with skin barrier ring. Education provided: Patient is not able to participate in her care; discussed the need to notify nursing staff when pouch was 1/3 to  1/2 full of flatus or stool. [facility] in [facility] program: No. Disposition to SNF is anticipated.   Two pouch set ups are in the room (Skin barriers, pouches, skin barrier rings)   WOC nursing team will follow, and will remain available to this patient, the nursing and medical teams.   Thanks, LMaudie Flakes MSN, RN, GNazlini CArther Abbott Pager# [phone]

## 2021-04-11 NOTE — Progress Notes (Signed)
PROGRESS NOTE  Laura Roberts QJF:[id number] DOB: [birth year] DOA: 04/05/2021 PCP: [name], MD  HPI/Recap of past 24 hours: This is a 80 year old female resident of [facility] with history of multiple sclerosis on Tecfidera, paraplegia bedbound with suprapubic catheter in place for neurogenic bladder, diabetes mellitus type 2, multinodular goiter on Tapazole who presented from the ID clinic for surgery consultation for decubitus ulcer she had had extensive work-up in months prior She was found to have leukocytosis due to chronic stage IV sacral decubitus ulcer MRI of the sacrum showed erosion of the sacrum adjacent to a large ulcer as well as 3 cm abscess she was referred to ID who recommended direct admission to hospital for surgical debridement and deep culture, surgery was consulted who took her to the OR  for debridement  Subjective: She does not have any specific complaints today.  Assessment/Plan: Principal Problem:   Chronic osteomyelitis ([place]) Active Problems:   Multiple sclerosis (HCC)   Essential hypertension   Paraplegia (HCC)   Hypokalemia   Type 2 diabetes mellitus with diabetic neuropathy, with long-term current use of insulin (HCC)   Chronic suprapubic catheter (HCC)   Decubitus ulcer   Multinodular goiter   Anemia in other chronic diseases classified elsewhere   Chronic respiratory failure (HCC)   Pressure injury of skin  Chronic osteomyelitis: Recent MRI shows bony erosion and a 3 cm abscess General surgery was consulted and she underwent wound debridement with a deep tissue culture.  Currently has wound VAC in place Infectious disease following  Chronic respiratory failure Patient is on nasal cannula O2  Multiple sclerosis Continue prednisone and Tecfidera Continue baclofen  Type 2 diabetes mellitus Continue Lantus Holding home Trulicity Continue sliding scale and NovoLog   Hypertension with mild lower extremity edema Continue Lasix and  metoprolol  Multinodular goiter Follow-up with Dr. Loanne Drilling Continue Tapazole  Paraplegia secondary to multiple sclerosis Suprapubic catheter is in place Continue duloxetine gabapentin and baclofen  Anemia of chronic disease Hemoglobin is stable  Leukocytosis.  Patient is afebrile Her white count is elevated more than yesterday Infectious disease following and based on wound culture results, she will be started on ertapenem for 6 weeks  Code Status: DNR  Severity of Illness: The appropriate patient status for this patient is INPATIENT. Inpatient status is judged to be reasonable and necessary in order to provide the required intensity of service to ensure the patient's safety. The patient's presenting symptoms, physical exam findings, and initial radiographic and laboratory data in the context of their chronic comorbidities is felt to place them at high risk for further clinical deterioration. Furthermore, it is not anticipated that the patient will be medically stable for discharge from the hospital within 2 midnights of admission. The following factors support the patient status of inpatient.   " Wound culture wound treatment  * I certify that at the point of admission it is my clinical judgment that the patient will require inpatient hospital care spanning beyond 2 midnights from the point of admission due to high intensity of service, high risk for further deterioration and high frequency of surveillance required.*   Family Communication: None at bedside  Disposition Plan: Status is: Inpatient  The patient is from: Skilled nursing facility              Anticipated d/c is to: Skilled nursing facility              Anticipated d/c date is: 04/12/2021  Patient currently not stable for discharge   Barrier to discharge-ongoing treatment for decubitus ulcer    Consultants: Surgery Infectious disease  Procedures: Wound debridement  Antimicrobials: None  DVT  prophylaxis: Lovenox   Objective: Vitals:   04/10/21 1324 04/10/21 2101 04/11/21 0447 04/11/21 1342  BP: (!) 126/93 118/70 113/66 115/66  Pulse: 89 92 84 98  Resp: 18 14 18 18   Temp: 98.8 F (37.1 C) 98.4 F (36.9 C) 98 F (36.7 C) 98.1 F (36.7 C)  TempSrc: Oral Oral Oral Oral  SpO2: 97% 99% 100% 98%  Weight:      Height:        Intake/Output Summary (Last 24 hours) at 04/11/2021 2047 Last data filed at 04/11/2021 1846 Gross per 24 hour  Intake 634.25 ml  Output 1475 ml  Net -840.75 ml   Filed Weights   04/06/21 1823  Weight: 82.1 kg   Body mass index is 33.1 kg/m.  Exam:  General: 80 y.o. year-old female well developed well nourished in no acute distress.  Alert and oriented x3.  Overweight Cardiovascular: Regular rate and rhythm with no rubs or gallops.  No thyromegaly or JVD noted.   Respiratory: Clear to auscultation with no wheezes or rales. Good inspiratory effort. Abdomen: Soft nontender nondistended with normal bowel sounds x4 quadrants.  Colostomy and colostomy bag in place Musculoskeletal: +1 lower extremity edema. 2/4 pulses in all 4 extremities. Skin: Sacral decubitus ulcer patient has a wound VAC Psychiatry: Mood is appropriate for condition and setting affect is flat Neurology: Paraplegia alert oriented x3 speech: Slow to answer questions    Data Reviewed: CBC: Recent Labs  Lab 04/05/21 1439 04/06/21 0535 04/07/21 0645 04/09/21 0453 04/10/21 0501 04/11/21 0510  WBC 16.9* 12.5* 11.7* 13.4* 16.7* 15.6*  NEUTROABS 14.9*  --   --   --   --   --   HGB 9.8* 9.4* 8.8* 8.3* 8.4* 8.5*  HCT 31.2* 29.0* 27.5* 26.5* 26.5* 27.0*  MCV 85.7 85.0 85.1 86.0 86.6 86.0  PLT 585* 549* 498* 472* 487* 329*   Basic Metabolic Panel: Recent Labs  Lab 04/06/21 0535 04/07/21 0645 04/09/21 0453 04/10/21 0501 04/11/21 0510  NA 139 143 138 139 141  K 2.8* 2.9* 2.9* 3.6 3.7  CL 99 100 94* 95* 94*  CO2 33* 35* 37* 36* 41*  GLUCOSE 80 99 125* 160* 84  BUN  10 6* 8 12 9   CREATININE 0.38* 0.43* 0.59 0.49 0.45  CALCIUM 8.2* 8.2* 8.0* 8.1* 8.8*  MG 1.8  --   --   --   --    GFR: Estimated Creatinine Clearance: 57.5 mL/min (by C-G formula based on SCr of 0.45 mg/dL). Liver Function Tests: No results for input(s): AST, ALT, ALKPHOS, BILITOT, PROT, ALBUMIN in the last 168 hours. No results for input(s): LIPASE, AMYLASE in the last 168 hours. No results for input(s): AMMONIA in the last 168 hours. Coagulation Profile: Recent Labs  Lab 04/05/21 1439  INR 1.2   Cardiac Enzymes: No results for input(s): CKTOTAL, CKMB, CKMBINDEX, TROPONINI in the last 168 hours. BNP (last 3 results) No results for input(s): PROBNP in the last 8760 hours. HbA1C: No results for input(s): HGBA1C in the last 72 hours.  CBG: Recent Labs  Lab 04/10/21 2026 04/11/21 0757 04/11/21 0835 04/11/21 1141 04/11/21 1621  GLUCAP 122* 63* 88 141* 135*   Lipid Profile: No results for input(s): CHOL, HDL, LDLCALC, TRIG, CHOLHDL, LDLDIRECT in the last 72 hours. Thyroid Function Tests: No results  for input(s): TSH, T4TOTAL, FREET4, T3FREE, THYROIDAB in the last 72 hours. Anemia Panel: No results for input(s): VITAMINB12, FOLATE, FERRITIN, TIBC, IRON, RETICCTPCT in the last 72 hours. Urine analysis:    Component Value Date/Time   COLORURINE YELLOW 11/08/2017 2312   APPEARANCEUR CLOUDY (A) 11/08/2017 2312   LABSPEC 1.011 11/08/2017 2312   PHURINE 6.0 11/08/2017 2312   GLUCOSEU NEGATIVE 11/08/2017 2312   HGBUR LARGE (A) 11/08/2017 2312   BILIRUBINUR NEGATIVE 11/08/2017 2312   KETONESUR NEGATIVE 11/08/2017 2312   PROTEINUR 100 (A) 11/08/2017 2312   UROBILINOGEN 1.0 08/04/2012 1938   NITRITE POSITIVE (A) 11/08/2017 2312   LEUKOCYTESUR LARGE (A) 11/08/2017 2312   Sepsis Labs: @LABRCNTIP (procalcitonin:4,lacticidven:4)  ) Recent Results (from the past 240 hour(s))  Resp Panel by RT-PCR (Flu A&B, Covid) Nasopharyngeal Swab     Status: None   Collection Time:  04/05/21  3:41 PM   Specimen: Nasopharyngeal Swab; Nasopharyngeal(NP) swabs in vial transport medium  Result Value Ref Range Status   SARS Coronavirus 2 by RT PCR NEGATIVE NEGATIVE Final    Comment: (NOTE) SARS-CoV-2 target nucleic acids are NOT DETECTED.  The SARS-CoV-2 RNA is generally detectable in upper respiratory specimens during the acute phase of infection. The lowest concentration of SARS-CoV-2 viral copies this assay can detect is 138 copies/mL. A negative result does not preclude SARS-Cov-2 infection and should not be used as the sole basis for treatment or other patient management decisions. A negative result may occur with  improper specimen collection/handling, submission of specimen other than nasopharyngeal swab, presence of viral mutation(s) within the areas targeted by this assay, and inadequate number of viral copies(<138 copies/mL). A negative result must be combined with clinical observations, patient history, and epidemiological information. The expected result is Negative.  Fact Sheet for Patients:  EntrepreneurPulse.com.au  Fact Sheet for Healthcare Providers:  IncredibleEmployment.be  This test is no t yet approved or cleared by the [place] FDA and  has been authorized for detection and/or diagnosis of SARS-CoV-2 by FDA under an Emergency Use Authorization (EUA). This EUA will remain  in effect (meaning this test can be used) for the duration of the COVID-19 declaration under Section 564(b)(1) of the Act, 21 U.S.C.section 360bbb-3(b)(1), unless the authorization is terminated  or revoked sooner.       Influenza A by PCR NEGATIVE NEGATIVE Final   Influenza B by PCR NEGATIVE NEGATIVE Final    Comment: (NOTE) The Xpert Xpress SARS-CoV-2/FLU/RSV plus assay is intended as an aid in the diagnosis of influenza from Nasopharyngeal swab specimens and should not be used as a sole basis for treatment. Nasal washings  and aspirates are unacceptable for Xpert Xpress SARS-CoV-2/FLU/RSV testing.  Fact Sheet for Patients: EntrepreneurPulse.com.au  Fact Sheet for Healthcare Providers: IncredibleEmployment.be  This test is not yet approved or cleared by the [place] FDA and has been authorized for detection and/or diagnosis of SARS-CoV-2 by FDA under an Emergency Use Authorization (EUA). This EUA will remain in effect (meaning this test can be used) for the duration of the COVID-19 declaration under Section 564(b)(1) of the Act, 21 U.S.C. section 360bbb-3(b)(1), unless the authorization is terminated or revoked.  Performed at [facility], [place] [address] [place], [place] [address]   Surgical pcr screen     Status: Abnormal   Collection Time: 04/07/21  9:24 AM   Specimen: Nasal Mucosa; Nasal Swab  Result Value Ref Range Status   MRSA, PCR POSITIVE (A) NEGATIVE Final    Comment: RESULT CALLED  TO, READ BACK BY AND VERIFIED WITH: Clotilde Dieter, RN 04/07/21 1041 KDS    Staphylococcus aureus POSITIVE (A) NEGATIVE Final    Comment: (NOTE) The Xpert SA Assay (FDA approved for NASAL specimens in patients 66 years of age and older), is one component of a comprehensive surveillance program. It is not intended to diagnose infection nor to guide or monitor treatment. Performed at [facility], [place] [address], [place], [place] [address]   Aerobic/Anaerobic Culture w Gram Stain (surgical/deep wound)     Status: None (Preliminary result)   Collection Time: 04/07/21 12:08 PM   Specimen: PATH Bone resection; Tissue  Result Value Ref Range Status   Specimen Description   Final    SACRAL BONE Performed at [facility] [address] [place], [place] [address]    Special Requests   Final    NONE Performed at [facility], [place] [address] [place], [place] [address]    Gram Stain   Final     FEW SQUAMOUS EPITHELIAL CELLS PRESENT FEW WBC SEEN FEW GRAM NEGATIVE RODS Performed at [facility], [place] [address] [place], [place] [address]    Culture   Final    ABUNDANT PROTEUS MIRABILIS ABUNDANT ESCHERICHIA COLI NO ANAEROBES ISOLATED; CULTURE IN PROGRESS FOR 5 DAYS    Report Status PENDING  Incomplete   Organism ID, Bacteria PROTEUS MIRABILIS  Final   Organism ID, Bacteria ESCHERICHIA COLI  Final      Susceptibility   Escherichia coli - MIC*    AMPICILLIN >=32 RESISTANT Resistant     CEFAZOLIN <=4 SENSITIVE Sensitive     CEFEPIME <=0.12 SENSITIVE Sensitive     CEFTAZIDIME <=1 SENSITIVE Sensitive     CEFTRIAXONE <=0.25 SENSITIVE Sensitive     CIPROFLOXACIN >=4 RESISTANT Resistant     GENTAMICIN >=16 RESISTANT Resistant     IMIPENEM <=0.25 SENSITIVE Sensitive     TRIMETH/SULFA <=20 SENSITIVE Sensitive     AMPICILLIN/SULBACTAM 16 INTERMEDIATE Intermediate     PIP/TAZO <=4 SENSITIVE Sensitive     * ABUNDANT ESCHERICHIA COLI   Proteus mirabilis - MIC*    AMPICILLIN >=32 RESISTANT Resistant     CEFAZOLIN >=64 RESISTANT Resistant     CEFEPIME <=0.12 SENSITIVE Sensitive     CEFTAZIDIME 16 INTERMEDIATE Intermediate     CEFTRIAXONE 2 INTERMEDIATE Intermediate     CIPROFLOXACIN >=4 RESISTANT Resistant     GENTAMICIN <=1 SENSITIVE Sensitive     IMIPENEM <=0.25 SENSITIVE Sensitive     TRIMETH/SULFA <=20 SENSITIVE Sensitive     AMPICILLIN/SULBACTAM >=32 RESISTANT Resistant     PIP/TAZO 64 INTERMEDIATE Intermediate     * ABUNDANT PROTEUS MIRABILIS      Studies: No results found.  Scheduled Meds:  (feeding supplement) PROSource Plus  30 mL Oral TID BM   vitamin C  500 mg Oral BID   baclofen  10 mg Oral Q1400   baclofen  15 mg Oral Q breakfast   baclofen  20 mg Oral QHS   Chlorhexidine Gluconate Cloth  6 each Topical Daily   collagenase   Topical Daily   darifenacin  15 mg Oral Daily   Dimethyl Fumarate  240 mg Oral BID   DULoxetine  90 mg Oral BID   enoxaparin  (LOVENOX) injection  40 mg Subcutaneous Q24H   feeding supplement (GLUCERNA SHAKE)  237 mL Oral BID BM   furosemide  20 mg Oral q AM   gabapentin  200 mg Oral TID  insulin aspart  0-15 Units Subcutaneous TID WC   insulin aspart  0-5 Units Subcutaneous QHS   insulin glargine-yfgn  15 Units Subcutaneous QHS   methimazole  5 mg Oral Q0600   metoprolol succinate  50 mg Oral QAC breakfast   multivitamin with minerals  1 tablet Oral Daily   mupirocin ointment  1 application Nasal BID   pantoprazole  40 mg Oral Q0600   predniSONE  2.5 mg Oral Q M,W,F,Su-1800   predniSONE  5 mg Oral Q T,Th,Sat-1800   Ensure Max Protein  11 oz Oral Daily   zinc sulfate  220 mg Oral Daily    Continuous Infusions:  ertapenem 1,000 mg (04/11/21 1637)   methocarbamol (ROBAXIN) IV       LOS: 6 days     Kathie Dike, MD [facility]   04/11/2021, 8:47 PM

## 2021-04-11 NOTE — Progress Notes (Addendum)
Pt's am CBS 63. Orange juice given to pt per hypoglcemia protocol.   CBS to be rechecked.    Rechecked cbs 88

## 2021-04-11 NOTE — Progress Notes (Signed)
Patient ID: Laura Roberts, female   DOB: May 22, 1941, 80 y.o.   MRN: [id number] [facility] Progress Note  4 Days Post-Op  Subjective: CC-  Seen during vac change today. Patient without new complaints. Poor appetite. Denies abdominal pain, nausea, vomiting. Ostomy productive.  Objective: Vital signs in last 24 hours: Temp:  [98 F (36.7 C)-98.8 F (37.1 C)] 98 F (36.7 C) (10/12 0447) Pulse Rate:  [84-92] 84 (10/12 0447) Resp:  [14-18] 18 (10/12 0447) BP: (113-126)/(66-93) 113/66 (10/12 0447) SpO2:  [97 %-100 %] 100 % (10/12 0447) Last BM Date: 04/10/21  Intake/Output from previous day: 10/11 0701 - 10/12 0700 In: 300 [P.O.:300] Out: 900 [Urine:850; Stool:50] Intake/Output this shift: No intake/output data recorded.  PE: Gen:  Alert, NAD, pleasant Pulm:  rate and effort normal Abd: Soft, NT/ND, +BS, incisions cdi, ostomy pink with with clean pouch in place/ red rubber present GU:     Lab Results:  Recent Labs    04/10/21 0501 04/11/21 0510  WBC 16.7* 15.6*  HGB 8.4* 8.5*  HCT 26.5* 27.0*  PLT 487* 490*   BMET Recent Labs    04/10/21 0501 04/11/21 0510  NA 139 141  K 3.6 3.7  CL 95* 94*  CO2 36* 41*  GLUCOSE 160* 84  BUN 12 9  CREATININE 0.49 0.45  CALCIUM 8.1* 8.8*   PT/INR No results for input(s): LABPROT, INR in the last 72 hours. CMP     Component Value Date/Time   NA 141 04/11/2021 0510   NA 144 03/16/2020 0000   NA 144 03/16/2020 0000   K 3.7 04/11/2021 0510   CL 94 (L) 04/11/2021 0510   CO2 41 (H) 04/11/2021 0510   GLUCOSE 84 04/11/2021 0510   BUN 9 04/11/2021 0510   BUN 14 03/16/2020 0000   BUN 14 03/16/2020 0000   CREATININE 0.45 04/11/2021 0510   CALCIUM 8.8 (L) 04/11/2021 0510   PROT 6.3 12/07/2019 1103   ALBUMIN 3.5 (L) 12/07/2019 1103   AST 11 12/07/2019 1103   ALT 15 12/07/2019 1103   ALKPHOS 74 12/07/2019 1103   BILITOT 0.3 12/07/2019 1103   GFRNONAA >60 04/11/2021 0510   GFRAA 90 03/16/2020 0000   GFRAA  >90 03/16/2020 0000   Lipase  No results found for: LIPASE     Studies/Results: No results found.  Anti-infectives: Anti-infectives (From admission, onward)    Start     Dose/Rate Route Frequency Ordered Stop   04/07/21 1059  metroNIDAZOLE (FLAGYL) 500 MG/100ML IVPB       Note to Pharmacy: Darlys Gales   : cabinet override      04/07/21 1059 04/07/21 1152   04/07/21 1059  ceFAZolin (ANCEF) 2-4 GM/100ML-% IVPB       Note to Pharmacy: Darlys Gales   : cabinet override      04/07/21 1059 04/07/21 2314        Assessment/Plan Sacral decubitus ulcer - stage IV Osteomyelitis -POD#4 S/p Sharp debridement of sacral decubitus ulcer using curette and electrocautery; Wound vac application to sacral decubitus ulcer; Laparoscopic creation of loop transverse colostomy 10/8 Dr. Thermon Leyland - tolerating diet and having bowel function - Plan to d/c red rubber 7-10 days postop - CM diet, glucerna - wound vac MWF - cultures pending, abx per ID - We will sign off, please call with questions or concerns. Follow up on AVS.    FEN: CM diet, glucerna ID: none currently VTE: lovenox   T2DM GERD HTN Neurogenic bowel Paraplegia  LOS: 6 days    [facility] 04/11/2021, 8:22 AM Please see Amion for pager number during day hours 7:00am-4:30pm

## 2021-04-11 NOTE — Progress Notes (Signed)
[facility] for Infectious Disease  Date of Admission:  04/05/2021           Reason for visit: Follow up on sacral decubitus ulcer/osteomyelitis  Current antibiotics: None     Previous antibiotics: Cefazolin x 1 dose 10/8 MTZ x 1 dose 10/8  ASSESSMENT:    80 y.o. female admitted with:  Infected sacral decubitus ulcer and osteomyelitis: Status post debridement and colostomy with surgery 04/07/2021.  Bone cultures from the OR with gram-negative rods (identification pending). Multiple sclerosis Diabetes: A1c 5.9.  RECOMMENDATIONS:    Continue holding antibiotics pending culture identification Will follow cultures Continue wound care, nutrition, offloading, diversion Will follow   Principal Problem:   Chronic osteomyelitis ([place]) Active Problems:   Multiple sclerosis ([place])   Essential hypertension   Paraplegia ([place])   Hypokalemia   Type 2 diabetes mellitus with diabetic neuropathy, with long-term current use of insulin (HCC)   Chronic suprapubic catheter ([place])   Decubitus ulcer   Multinodular goiter   Anemia in other chronic diseases classified elsewhere   Chronic respiratory failure (HCC)   Pressure injury of skin    MEDICATIONS:    Scheduled Meds: . (feeding supplement) PROSource Plus  30 mL Oral TID BM  . vitamin C  500 mg Oral BID  . baclofen  10 mg Oral Q1400  . baclofen  15 mg Oral Q breakfast  . baclofen  20 mg Oral QHS  . Chlorhexidine Gluconate Cloth  6 each Topical Daily  . collagenase   Topical Daily  . darifenacin  15 mg Oral Daily  . Dimethyl Fumarate  240 mg Oral BID  . DULoxetine  90 mg Oral BID  . enoxaparin (LOVENOX) injection  40 mg Subcutaneous Q24H  . feeding supplement (GLUCERNA SHAKE)  237 mL Oral BID BM  . furosemide  20 mg Oral q AM  . gabapentin  200 mg Oral TID  . insulin aspart  0-15 Units Subcutaneous TID WC  . insulin aspart  0-5 Units Subcutaneous QHS  . insulin glargine-yfgn  15 Units Subcutaneous QHS  .  methimazole  5 mg Oral Q0600  . metoprolol succinate  50 mg Oral QAC breakfast  . multivitamin with minerals  1 tablet Oral Daily  . mupirocin ointment  1 application Nasal BID  . pantoprazole  40 mg Oral Q0600  . predniSONE  2.5 mg Oral Q M,W,F,Su-1800  . predniSONE  5 mg Oral Q T,Th,Sat-1800  . Ensure Max Protein  11 oz Oral Daily  . zinc sulfate  220 mg Oral Daily   Continuous Infusions: . methocarbamol (ROBAXIN) IV     PRN Meds:.acetaminophen **OR** acetaminophen, bisacodyl, HYDROmorphone (DILAUDID) injection, influenza vaccine adjuvanted, magnesium hydroxide, methocarbamol (ROBAXIN) IV, ondansetron **OR** ondansetron (ZOFRAN) IV, ondansetron, oxyCODONE, oxyCODONE, phenol  SUBJECTIVE:   24 hour events:  No acute events noted overnight CBG 63 this morning No fevers WBC stable Cultures pending  Patient with no new complaints this morning.   Review of Systems  All other systems reviewed and are negative.    OBJECTIVE:   Blood pressure 113/66, pulse 84, temperature 98 F (36.7 C), temperature source Oral, resp. rate 18, height 5\' 2"  (1.575 m), weight 82.1 kg, SpO2 100 %. Body mass index is 33.1 kg/m.  Physical Exam Constitutional:      Comments: Sitting up in bed eating breakfast, no acute distress.  Pulmonary:     Effort: Pulmonary effort is normal. No respiratory distress.  Skin:    General:  Skin is warm and dry.     Findings: No rash.  Neurological:     General: No focal deficit present.     Mental Status: She is oriented to person, place, and time.  Psychiatric:        Mood and Affect: Mood normal.        Behavior: Behavior normal.       Lab Results: Lab Results  Component Value Date   WBC 15.6 (H) 04/11/2021   HGB 8.5 (L) 04/11/2021   HCT 27.0 (L) 04/11/2021   MCV 86.0 04/11/2021   PLT 490 (H) 04/11/2021    Lab Results  Component Value Date   NA 141 04/11/2021   K 3.7 04/11/2021   CO2 41 (H) 04/11/2021   GLUCOSE 84 04/11/2021   BUN 9  04/11/2021   CREATININE 0.45 04/11/2021   CALCIUM 8.8 (L) 04/11/2021   GFRNONAA >60 04/11/2021   GFRAA 90 03/16/2020   GFRAA >90 03/16/2020    Lab Results  Component Value Date   ALT 15 12/07/2019   AST 11 12/07/2019   ALKPHOS 74 12/07/2019   BILITOT 0.3 12/07/2019       Component Value Date/Time   CRP 7.6 (H) 04/05/2021 1439       Component Value Date/Time   ESRSEDRATE 88 (H) 04/05/2021 1439     I have reviewed the micro and lab results in Epic.  Imaging: No results found.   Imaging independently reviewed in Epic.    Raynelle Highland for Infectious Disease [facility] [phone] pager 04/11/2021, 9:53 AM  I spent greater than 35 minutes with the patient including greater than 50% of time in face to face counsel of the patient and in coordination of their care.

## 2021-04-11 NOTE — TOC Initial Note (Signed)
Transition of Care [facility]) - Initial/Assessment Note    Patient Details  Name: Laura Roberts MRN: [id number] Date of Birth: 08-10-1940  [facility] CM/SW Contact:    Lynnell Catalan, RN Phone Number: 04/11/2021, 1:23 PM  Clinical Narrative:                 Pt is a long term care resident from [facility]. [facility] liaison has been updated as to pt condition and the need for a Vac and redistribution mattress at dc back to facility. TOC will continue to follow and communicate with SNF.   Expected Discharge Plan: Long Term Nursing Home Barriers to Discharge: Continued Medical Work up   Expected Discharge Plan and Services Expected Discharge Plan: [place]   Discharge Planning Services: CM Consult   Living arrangements for the past 2 months: [place]                       Prior Living Arrangements/Services Living arrangements for the past 2 months: [place] Lives with:: Facility Resident Patient language and need for interpreter reviewed:: Yes        Need for Family Participation in Patient Care: No (Comment)     Criminal Activity/Legal Involvement Pertinent to Current Situation/Hospitalization: No - Comment as needed  Activities of Daily Living Home Assistive Devices/Equipment: Wheelchair, Civil Service fast streamer, Wellsite geologist, Dentures (specify type) (partial upper denture, shower bed) ADL Screening (condition at time of admission) Patient's cognitive ability adequate to safely complete daily activities?: Yes Is the patient deaf or have difficulty hearing?: No Does the patient have difficulty seeing, even when wearing glasses/contacts?: No Does the patient have difficulty concentrating, remembering, or making decisions?: No Patient able to express need for assistance with ADLs?: Yes Does the patient have difficulty dressing or bathing?: Yes Independently performs ADLs?: No Communication: Independent Dressing (OT): Needs  assistance Is this a change from baseline?: Pre-admission baseline Grooming: Independent Feeding: Independent Bathing: Needs assistance Is this a change from baseline?: Pre-admission baseline Toileting: Needs assistance Is this a change from baseline?: Pre-admission baseline In/Out Bed: Needs assistance Is this a change from baseline?: Pre-admission baseline Walks in Home: Dependent Is this a change from baseline?: Pre-admission baseline Does the patient have difficulty walking or climbing stairs?: Yes Weakness of Legs: Both Weakness of Arms/Hands: None  Admission diagnosis:  Paraplegia ([place]) [G82.20] MS (multiple sclerosis) ([place]) [G35] Osteomyelitis of sacrum ([place]) [M46.28] Chronic osteomyelitis ([place]) [M86.60] Pressure injury of sacral region, unstageable ([place]) [L89.150] Patient Active Problem List   Diagnosis Date Noted   Pressure injury of skin 04/06/2021   Sacral osteomyelitis ([place]) 04/05/2021   Stage 4 decubitus ulcer ([place]) 04/05/2021   Abscess 04/05/2021   Chronic osteomyelitis ([place]) 04/05/2021   Multinodular goiter 04/05/2021   Anemia in other chronic diseases classified elsewhere 04/05/2021   Chronic respiratory failure ([place]) 04/05/2021   Diarrhea of presumed infectious origin 03/13/2021   Protein-calorie malnutrition ([place]) 08/01/2020   Pruritus of scalp 04/13/2020   Essential (hemorrhagic) thrombocythemia ([place]) 10/28/2019   Recurrent depression ([place]) 01/14/2019   Decubitus ulcer 01/31/2018   Hypoxia 01/30/2018   Hyperthyroidism 01/14/2018   Chronic suprapubic catheter ([place]) 10/29/2017   Inguinal abscess 10/24/2017   Hypokalemia    Type 2 diabetes mellitus with diabetic neuropathy, with long-term current use of insulin ([place])    Hypomagnesemia    Complicated UTI (urinary tract infection) 10/09/2017   Sepsis ([place]) 11/22/2016   Leukocytosis    Paraplegia ([place]) 06/01/2014  Encounter for therapeutic drug monitoring 01/05/2014   Foot drop, bilateral 10/13/2012    Neurogenic bladder 09/02/2012   Neurogenic pain of lower extremity 09/02/2012   Essential hypertension 07/21/2012   HLD (hyperlipidemia) 07/21/2012   GERD (gastroesophageal reflux disease) 07/21/2012   Multiple sclerosis ([place])    PCP:  [name], MD Pharmacy:   [facility] - [place], [address] [place] [address] Phone: [phone] Fax: [phone]     Social Determinants of Health (SDOH) Interventions    Readmission Risk Interventions Readmission Risk Prevention Plan 04/11/2021  PCP or Specialist Appt within 3-5 Days Complete  HRI or [place] Complete  Social Work Consult for [place] Planning/Counseling Complete  Palliative Care Screening Not Applicable  Medication Review Press photographer) Complete  Some recent data might be hidden

## 2021-04-12 DIAGNOSIS — E1140 Type 2 diabetes mellitus with diabetic neuropathy, unspecified: Secondary | ICD-10-CM | POA: Diagnosis not present

## 2021-04-12 DIAGNOSIS — L89150 Pressure ulcer of sacral region, unstageable: Secondary | ICD-10-CM | POA: Diagnosis not present

## 2021-04-12 DIAGNOSIS — D638 Anemia in other chronic diseases classified elsewhere: Secondary | ICD-10-CM | POA: Diagnosis not present

## 2021-04-12 DIAGNOSIS — M4628 Osteomyelitis of vertebra, sacral and sacrococcygeal region: Secondary | ICD-10-CM | POA: Diagnosis not present

## 2021-04-12 DIAGNOSIS — J9612 Chronic respiratory failure with hypercapnia: Secondary | ICD-10-CM | POA: Diagnosis not present

## 2021-04-12 DIAGNOSIS — M8660 Other chronic osteomyelitis, unspecified site: Secondary | ICD-10-CM | POA: Diagnosis not present

## 2021-04-12 DIAGNOSIS — G35 Multiple sclerosis: Secondary | ICD-10-CM | POA: Diagnosis not present

## 2021-04-12 LAB — AEROBIC/ANAEROBIC CULTURE W GRAM STAIN (SURGICAL/DEEP WOUND)

## 2021-04-12 LAB — BASIC METABOLIC PANEL
Anion gap: 8 (ref 5–15)
BUN: 8 mg/dL (ref 8–23)
CO2: 41 mmol/L — ABNORMAL HIGH (ref 22–32)
Calcium: 8.9 mg/dL (ref 8.9–10.3)
Chloride: 93 mmol/L — ABNORMAL LOW (ref 98–111)
Creatinine, Ser: 0.35 mg/dL — ABNORMAL LOW (ref 0.44–1.00)
GFR, Estimated: >60 mL/min (ref >60)
Glucose, Bld: 84 mg/dL (ref 70–99)
Potassium: 3.6 mmol/L (ref 3.5–5.1)
Sodium: 142 mmol/L (ref 135–145)

## 2021-04-12 LAB — CBC
HCT: 26.1 % — ABNORMAL LOW (ref 36.0–46.0)
Hemoglobin: 8.5 g/dL — ABNORMAL LOW (ref 12.0–15.0)
MCH: 27.2 pg (ref 26.0–34.0)
MCHC: 32.6 g/dL (ref 30.0–36.0)
MCV: 83.7 fL (ref 80.0–100.0)
Platelets: 475 10*3/uL — ABNORMAL HIGH (ref 150–400)
RBC: 3.12 MIL/uL — ABNORMAL LOW (ref 3.87–5.11)
RDW: 19.6 % — ABNORMAL HIGH (ref 11.5–15.5)
WBC: 14.7 10*3/uL — ABNORMAL HIGH (ref 4.0–10.5)
nRBC: 0 % (ref 0.0–0.2)

## 2021-04-12 LAB — GLUCOSE, CAPILLARY
Glucose-Capillary: 79 mg/dL (ref 70–99)
Glucose-Capillary: 164 mg/dL — ABNORMAL HIGH (ref 70–99)
Glucose-Capillary: 82 mg/dL (ref 70–99)
Glucose-Capillary: 91 mg/dL (ref 70–99)

## 2021-04-12 MED ORDER — GLUCERNA SHAKE PO LIQD: 237.0000 mL | Freq: Two times a day (BID) | ORAL | 0 refills | Status: DC

## 2021-04-12 MED ORDER — PROSOURCE PLUS PO LIQD: 30.0000 mL | Freq: Three times a day (TID) | ORAL | Status: DC

## 2021-04-12 MED ORDER — POLYETHYLENE GLYCOL 3350 17 G PO PACK: 17.0000 g | PACK | Freq: Every day | ORAL | 0 refills | Status: DC | PRN

## 2021-04-12 MED ORDER — DULOXETINE HCL 30 MG PO CPEP
90.0000 mg | ORAL_CAPSULE | Freq: Two times a day (BID) | ORAL | Status: DC
Start: 2021-04-12 — End: 2022-11-01

## 2021-04-12 MED ORDER — ERTAPENEM IV (FOR PTA / DISCHARGE USE ONLY): 1.0000 g | INTRAVENOUS | 0 refills | Status: DC

## 2021-04-12 MED ORDER — OXYCODONE HCL 5 MG PO TABS: 5.0000 mg | ORAL_TABLET | Freq: Four times a day (QID) | ORAL | 0 refills | Status: DC | PRN

## 2021-04-12 NOTE — Plan of Care (Signed)
  Problem: Education: Goal: Knowledge of General Education information will improve Description: Including pain rating scale, medication(s)/side effects and non-pharmacologic comfort measures Outcome: Progressing   Problem: Health Behavior/Discharge Planning: Goal: Ability to manage health-related needs will improve Outcome: Progressing   Problem: Clinical Measurements: Goal: Ability to maintain clinical measurements within normal limits will improve Outcome: Progressing Goal: Will remain free from infection Outcome: Progressing Goal: Respiratory complications will improve Outcome: Progressing Goal: Cardiovascular complication will be avoided Outcome: Progressing   Problem: Nutrition: Goal: Adequate nutrition will be maintained Outcome: Progressing   Problem: Coping: Goal: Level of anxiety will decrease Outcome: Progressing   Problem: Elimination: Goal: Will not experience complications related to bowel motility Outcome: Progressing Goal: Will not experience complications related to urinary retention Outcome: Progressing   Problem: Pain Managment: Goal: General experience of comfort will improve Outcome: Progressing   Problem: Safety: Goal: Ability to remain free from injury will improve Outcome: Progressing   Problem: Education: Goal: Knowledge of General Education information will improve Description: Including pain rating scale, medication(s)/side effects and non-pharmacologic comfort measures Outcome: Progressing   Problem: Clinical Measurements: Goal: Ability to maintain clinical measurements within normal limits will improve Outcome: Progressing

## 2021-04-12 NOTE — Progress Notes (Addendum)
PHARMACY CONSULT NOTE FOR:  OUTPATIENT  PARENTERAL ANTIBIOTIC THERAPY (OPAT)  Indication: Sacral osteomyelitis  Regimen: Ertapenem 1g IV Q24H  End date: 05/23/2021  IV antibiotic discharge orders are pended. To discharging provider:  please sign these orders via discharge navigator,  Select New Orders & click on the button choice - Manage This Unsigned Work.     Thank you for allowing pharmacy to be a part of this patient's care.  Adria Dill, PharmD PGY-1 Acute Care Resident  04/12/2021 9:16 AM

## 2021-04-12 NOTE — Progress Notes (Signed)
[facility] for Infectious Disease  Date of Admission:  04/05/2021           Reason for visit: Follow up on sacral OM  Current antibiotics: Ertapenem 10/12-present  ASSESSMENT:    80 y.o. female admitted with:  Infected sacral decubitus ulcer and OM secondary to MDR Proteus mirabilis and E. Coli: Status post debridement and colostomy with surgery 04/07/21.  Bone cultures polymicrobial with Proteus and E coli. MS DM: A1c is 5.9.  RECOMMENDATIONS:    Continue ertapenem x 6 weeks Wound care, nutrition, offloading, diversion See OPAT note Will sign off, please call as needed   Diagnosis: Sacral OM  Culture Result: MDR Proteus mirabilis and E coli  Allergies  Allergen Reactions  . Codeine Other (See Comments)    Difficult breathing and skin problem  . Ultram [Tramadol] Other (See Comments)    Difficult breathing and skin peeling  . Januvia [Sitagliptin] Rash and Other (See Comments)    Blisters    OPAT Orders Discharge antibiotics to be given via PICC line Discharge antibiotics: Per pharmacy protocol ertapenem 1gm daily  Duration: 6 weeks  End Date: 05/23/21  [facility] Care Per Protocol:  Home health RN for IV administration and teaching; PICC line care and labs.    Labs weekly while on IV antibiotics: _x_ CBC with differential _x_ BMP __ CMP _x_ CRP _x_ ESR __ Vancomycin trough __ CK  _x_ Please pull PIC at completion of IV antibiotics __ Please leave PIC in place until doctor has seen patient or been notified  Fax weekly labs to [phone]  Clinic Follow Up Appt: Dr Tommy Medal on 05/10/21 at 1045am  Principal Problem:   Chronic osteomyelitis ([place]) Active Problems:   Multiple sclerosis ([place])   Essential hypertension   Paraplegia ([place])   Hypokalemia   Type 2 diabetes mellitus with diabetic neuropathy, with long-term current use of insulin ([place])   Chronic suprapubic catheter ([place])   Decubitus ulcer   Multinodular goiter   Anemia in  other chronic diseases classified elsewhere   Chronic respiratory failure ([place])   Pressure injury of skin    MEDICATIONS:    Scheduled Meds: . (feeding supplement) PROSource Plus  30 mL Oral TID BM  . vitamin C  500 mg Oral BID  . baclofen  10 mg Oral Q1400  . baclofen  15 mg Oral Q breakfast  . baclofen  20 mg Oral QHS  . Chlorhexidine Gluconate Cloth  6 each Topical Daily  . collagenase   Topical Daily  . darifenacin  15 mg Oral Daily  . Dimethyl Fumarate  240 mg Oral BID  . DULoxetine  90 mg Oral BID  . enoxaparin (LOVENOX) injection  40 mg Subcutaneous Q24H  . feeding supplement (GLUCERNA SHAKE)  237 mL Oral BID BM  . furosemide  20 mg Oral q AM  . gabapentin  200 mg Oral TID  . insulin aspart  0-15 Units Subcutaneous TID WC  . insulin aspart  0-5 Units Subcutaneous QHS  . insulin glargine-yfgn  15 Units Subcutaneous QHS  . methimazole  5 mg Oral Q0600  . metoprolol succinate  50 mg Oral QAC breakfast  . multivitamin with minerals  1 tablet Oral Daily  . mupirocin ointment  1 application Nasal BID  . pantoprazole  40 mg Oral Q0600  . predniSONE  2.5 mg Oral Q M,W,F,Su-1800  . predniSONE  5 mg Oral Q T,Th,Sat-1800  . Ensure Max Protein  11 oz  Oral Daily  . zinc sulfate  220 mg Oral Daily   Continuous Infusions: . ertapenem 1,000 mg (04/11/21 1637)  . methocarbamol (ROBAXIN) IV     PRN Meds:.acetaminophen **OR** acetaminophen, bisacodyl, HYDROmorphone (DILAUDID) injection, influenza vaccine adjuvanted, magnesium hydroxide, methocarbamol (ROBAXIN) IV, ondansetron **OR** ondansetron (ZOFRAN) IV, ondansetron, oxyCODONE, oxyCODONE, phenol  SUBJECTIVE:   24 hour events:  No events noted Cx with MDR Proteus and E coli Started on Ertapenem  No new complaints. Discussed abx plan.  No fevers, chills.  Tolerating abx.  Review of Systems  All other systems reviewed and are negative.    OBJECTIVE:   Blood pressure 114/63, pulse 87, temperature 97.9 F (36.6 C),  temperature source Oral, resp. rate 14, height _0  (1.575 m), weight 82.1 kg, SpO2 97 %. Body mass index is 33.1 kg/m.  Physical Exam Constitutional:      General: She is not in acute distress.    Appearance: Normal appearance.  Pulmonary:     Effort: Pulmonary effort is normal. No respiratory distress.  Musculoskeletal:     Comments: Wound vac in place.  PICC line in place.   Neurological:     General: No focal deficit present.     Mental Status: She is alert and oriented to person, place, and time.  Psychiatric:        Mood and Affect: Mood normal.        Behavior: Behavior normal.     Lab Results: Lab Results  Component Value Date   WBC 14.7 (H) 04/12/2021   HGB 8.5 (L) 04/12/2021   HCT 26.1 (L) 04/12/2021   MCV 83.7 04/12/2021   PLT 475 (H) 04/12/2021    Lab Results  Component Value Date   NA 142 04/12/2021   K 3.6 04/12/2021   CO2 41 (H) 04/12/2021   GLUCOSE 84 04/12/2021   BUN 8 04/12/2021   CREATININE 0.35 (L) 04/12/2021   CALCIUM 8.9 04/12/2021   GFRNONAA >60 04/12/2021   GFRAA 90 03/16/2020   GFRAA >90 03/16/2020    Lab Results  Component Value Date   ALT 15 12/07/2019   AST 11 12/07/2019   ALKPHOS 74 12/07/2019   BILITOT 0.3 12/07/2019       Component Value Date/Time   CRP 7.6 (H) 04/05/2021 1439       Component Value Date/Time   ESRSEDRATE 88 (H) 04/05/2021 1439     I have reviewed the micro and lab results in Epic.  Imaging: No results found.   Imaging independently reviewed in Epic.    Raynelle Highland for Infectious Disease [facility] [phone] pager 04/12/2021, 8:53 AM  I spent greater than 35 minutes with the patient including greater than 50% of time in face to face counsel of the patient and in coordination of their care.

## 2021-04-12 NOTE — Discharge Summary (Signed)
Physician Discharge Summary  Laura Roberts GGY:[id number] DOB: 11-24-1940 DOA: 04/05/2021  PCP: [name], MD  Admit date: 04/05/2021 Discharge date: 04/12/2021  Admitted From: SNF Disposition:  SNF  Recommendations for Outpatient Follow-up:  Continue on IV ertapenem through PICC on discharge, end date is 05/23/2021 Follow up with Dr. Drucilla Schmidt in [facility] [place] [facility] on 05/10/21 at 10:45am Continue wound vac for sacral wound with vac change every MWF Follow up with general surgery, Dr. Thermon Leyland in 4 weeks Repeat CBC in 1 week  Discharge Condition:stable CODE STATUS:DNR Diet recommendation: heart healthy/carb modified  Brief/Interim Summary: This is a 80 year old female resident of [facility] with history of multiple sclerosis on Tecfidera, paraplegia bedbound with suprapubic catheter in place for neurogenic bladder, diabetes mellitus type 2, multinodular goiter on Tapazole who presented from the ID clinic for surgery consultation for decubitus ulcer she had had extensive work-up in months prior She was found to have leukocytosis due to chronic stage IV sacral decubitus ulcer MRI of the sacrum showed erosion of the sacrum adjacent to a large ulcer as well as 3 cm abscess she was referred to ID who recommended direct admission to hospital for surgical debridement and deep culture, surgery was consulted who took her to the OR  for debridement and performed diverting colostomy. Based on cultures results, she is being treated with 6 weeks of ertapenem  Discharge Diagnoses:  Principal Problem:   Chronic osteomyelitis ([place]) Active Problems:   Multiple sclerosis ([place])   Essential hypertension   Paraplegia (HCC)   Hypokalemia   Type 2 diabetes mellitus with diabetic neuropathy, with long-term current use of insulin (HCC)   Chronic suprapubic catheter (H[facility])   Decubitus ulcer   Multinodular goiter   Anemia in other chronic diseases classified elsewhere   Chronic respiratory failure (HCC)    Pressure injury of skin  Chronic osteomyelitis: Recent MRI shows bony erosion and a 3 cm abscess General surgery was consulted and she underwent wound debridement with a deep tissue culture.  Currently has wound VAC in place Infectious disease following Wound culture polymicrobial with Proteus and E coli Started on ertapenem for 6 weeks  Chronic respiratory failure Patient is on nasal cannula O2  Multiple sclerosis Continue prednisone and Tecfidera Continue baclofen  Type 2 diabetes mellitus Continue Lantus Resume home Trulicity on discharge   Hypertension with mild lower extremity edema Continue Lasix and metoprolol  Multinodular goiter Follow-up with Dr. Loanne Drilling Continue Tapazole  Paraplegia secondary to multiple sclerosis Suprapubic catheter is in place Continue duloxetine gabapentin and baclofen  Anemia of chronic disease Hemoglobin is stable  Leukocytosis.  Patient is afebrile WBC count remains mildly elevated, but stable. She is afebrile Infectious disease following and based on wound culture results, she will be started on ertapenem for 6 weeks.  Repeat cbc in 1 week  Discharge Instructions  Discharge Instructions     [facility] pharmacist to adjust dose for Vancomycin, Aminoglycosides and other anti-infective therapies as requested by physician.   Complete by: As directed    [facility] to provide Cath Flo 48m   Complete by: As directed    Administer for PICC line occlusion and as ordered by physician for other access device issues.   Anaphylaxis Kit: Provided to treat any anaphylactic reaction to the medication being provided to the patient if First Dose or when requested by physician   Complete by: As directed    Epinephrine 128mml vial / amp: Administer 0.80m380m0.80ml14mubcutaneously once for moderate to severe  anaphylaxis, nurse to call physician and pharmacy when reaction occurs and call 911 if needed for immediate care    Diphenhydramine 91m/ml IV vial: Administer 25-582mIV/IM PRN for first dose reaction, rash, itching, mild reaction, nurse to call physician and pharmacy when reaction occurs   Sodium Chloride 0.9% NS 50060mV: Administer if needed for hypovolemic blood pressure drop or as ordered by physician after call to physician with anaphylactic reaction   Change dressing on IV access line weekly and PRN   Complete by: As directed    Diet - low sodium heart healthy   Complete by: As directed    Discharge wound care:   Complete by: As directed    Negative pressure wound therapy  Every Mon-Wed-Fri 1000    Comments: Large black foam kit- 1 piece foam to sacrum, 1 piece foam to bridge to hip.   Barrier ring /skin prep to periwound skin Question:  Amount of suction?  Answer:  125 mm/Hg   Flush IV access with Sodium Chloride 0.9% and Heparin 10 units/ml or 100 units/ml   Complete by: As directed    Home infusion instructions - [facility]   Complete by: As directed    Instructions: Flush IV access with Sodium Chloride 0.9% and Heparin 10units/ml or 100units/ml   Change dressing on IV access line: Weekly and PRN   Instructions Cath Flo 2mg12mdminister for PICC Line occlusion and as ordered by physician for other access device   [facility] pharmacist to adjust dose for: Vancomycin, Aminoglycosides and other anti-infective therapies as requested by physician   Increase activity slowly   Complete by: As directed    Method of administration may be changed at the discretion of home infusion pharmacist based upon assessment of the patient and/or caregiver's ability to self-administer the medication ordered   Complete by: As directed    Outpatient Parenteral Antibiotic Therapy Information Antibiotic: Ertapenem (Invanz) IVPB; Indications for use: Sacral Osteomyelitis; End Date: 06/11/2021   Complete by: As directed    Antibiotic: Ertapenem (InvColbert EwingPB   Indications for use: Sacral  Osteomyelitis   End Date: 06/11/2021      Allergies as of 04/12/2021       Reactions   Codeine Other (See Comments)   Difficult breathing and skin problem   Ultram [tramadol] Other (See Comments)   Difficult breathing and skin peeling   Januvia [sitagliptin] Rash, Other (See Comments)   Blisters        Medication List     STOP taking these medications    ALPRAZolam 0.5 MG tablet Commonly known as: XANAX   vancomycin 1,500 mg in sodium chloride 0.9 % 250 mL       TAKE these medications    acetaminophen 325 MG tablet Commonly known as: TYLENOL Take 650 mg by mouth every 6 (six) hours as needed for mild pain or fever.   baclofen 10 MG tablet Commonly known as: LIORESAL Take 10-15 mg by mouth daily. (0800 & 1200) Take 1.5 tablets (15 mg) by mouth in the morning & take 1 tablet (10 mg) by mouth at noon.   baclofen 20 MG tablet Commonly known as: LIORESAL Take 20 mg by mouth at bedtime. (2000)   barrier cream Crea Commonly known as: non-specified Apply 1 application topically 2 (two) times daily. (0700 & 1900) Apply to right ischium and bilateral buttocks   calcium citrate 950 (200 Ca) MG tablet Commonly known as: CALCITRATE - dosed in mg elemental calcium Take  200 mg of elemental calcium by mouth daily.   cholecalciferol 25 MCG (1000 UNIT) tablet Commonly known as: VITAMIN D Take 1,000 Units by mouth daily.   cycloSPORINE 0.05 % ophthalmic emulsion Commonly known as: RESTASIS Place 1 drop into both eyes 2 (two) times daily. (0800 & 2000)   DULoxetine 30 MG capsule Commonly known as: CYMBALTA Take 3 capsules (90 mg total) by mouth 2 (two) times daily. (0800)Give along with 30 mg to = 90 mg What changed:  medication strength how much to take   ertapenem  IVPB Commonly known as: INVANZ Inject 1 g into the vein daily. Indication:  Sacral osteomyelitis  First Dose: No Last Day of Therapy:  05/23/21 Labs - Once weekly:  CBC/D and BMP, Labs - Every other  week:  ESR and CRP Method of administration: Mini-Bag Plus / Gravity Method of administration may be changed at the discretion of home infusion pharmacist based upon assessment of the patient and/or caregiver's ability to self-administer the medication ordered.   feeding supplement (GLUCERNA SHAKE) Liqd Take 237 mLs by mouth 2 (two) times daily between meals.   (feeding supplement) PROSource Plus liquid Take 30 mLs by mouth 3 (three) times daily between meals.   feeding supplement (PRO-STAT SUGAR FREE 64) Liqd Take 30 mLs by mouth 2 (two) times daily.   ferrous sulfate 325 (65 FE) MG tablet Take 325 mg by mouth 2 (two) times daily with a meal.   furosemide 20 MG tablet Commonly known as: LASIX Take 20 mg by mouth in the morning.   gabapentin 100 MG capsule Commonly known as: NEURONTIN Take 200 mg by mouth 3 (three) times daily.   guaiFENesin 600 MG 12 hr tablet Commonly known as: MUCINEX Take 1,200 mg by mouth at bedtime. (2000)   insulin glargine 100 UNIT/ML Solostar Pen Commonly known as: LANTUS Inject 18 Units into the skin at bedtime. (2000)   loperamide 2 MG capsule Commonly known as: IMODIUM Take 4 mg by mouth daily as needed for diarrhea or loose stools (SUBSEQUENT DOSE GIVE ONE TAB AFTER EACH STOOL (max 8 mg/24 hrs.)).   magnesium oxide 400 MG tablet Commonly known as: MAG-OX Take 400 mg by mouth 2 (two) times daily.   methimazole 5 MG tablet Commonly known as: TAPAZOLE Take 1 tablet (5 mg total) by mouth daily. What changed:  when to take this additional instructions   metoprolol succinate 50 MG 24 hr tablet Commonly known as: TOPROL-XL Take 50 mg by mouth daily.   multivitamin with minerals Tabs tablet Take 1 tablet by mouth daily.   ondansetron 4 MG tablet Commonly known as: ZOFRAN Take 4 mg by mouth every 6 (six) hours as needed for nausea or vomiting.   OPTIVE 0.5-0.9 % ophthalmic solution Generic drug: carboxymethylcellul-glycerin Place 1 drop  into both eyes 4 (four) times daily as needed for dry eyes. (0800,1200, 1600 & 2000)   oxyCODONE 5 MG immediate release tablet Commonly known as: Oxy IR/ROXICODONE Take 1 tablet (5 mg total) by mouth every 6 (six) hours as needed for moderate pain.   pantoprazole 40 MG tablet Commonly known as: PROTONIX Take 40 mg by mouth daily at 6 (six) AM. (0600)   polyethylene glycol 17 g packet Commonly known as: MIRALAX / GLYCOLAX Take 17 g by mouth daily as needed. What changed:  when to take this reasons to take this   potassium chloride SA 20 MEQ tablet Commonly known as: KLOR-CON Take 20 mEq by mouth 2 (two) times daily.  predniSONE 5 MG tablet Commonly known as: DELTASONE Take 5 mg by mouth every other day. (0800) Alternating days with 2.5 mg dose   predniSONE 2.5 MG tablet Commonly known as: DELTASONE Take 2.5 mg by mouth every other day. (0800) Alternating days with 5 mg dose   saccharomyces boulardii 250 MG capsule Commonly known as: FLORASTOR Take 250 mg by mouth 2 (two) times daily.   SF 5000 Plus 1.1 % Crea dental cream Generic drug: sodium fluoride Take 1 application by mouth daily at 8 pm. (2000)   solifenacin 10 MG tablet Commonly known as: VESICARE Take 10 mg by mouth daily. (0800)   Tecfidera 240 MG Cpdr Generic drug: Dimethyl Fumarate Take 1 capsule (240 mg total) by mouth 2 (two) times daily. (1540 & 0867)   Trulicity 3 YP/9.5KD Sopn Generic drug: Dulaglutide Inject 3 mg into the skin every Friday. (0800)   vitamin C 500 MG tablet Commonly known as: ASCORBIC ACID Take 500 mg by mouth in the morning and at bedtime.   zinc sulfate 220 (50 Zn) MG capsule Take 220 mg by mouth daily.               Discharge Care Instructions  (From admission, onward)           Start     Ordered   04/12/21 0000  Change dressing on IV access line weekly and PRN  (Home infusion instructions - [facility] )        04/12/21 1214   04/12/21 0000   Discharge wound care:       Comments: Negative pressure wound therapy  Every Mon-Wed-Fri 1000    Comments: Large black foam kit- 1 piece foam to sacrum, 1 piece foam to bridge to hip.   Barrier ring /skin prep to periwound skin Question:  Amount of suction?  Answer:  125 mm/Hg   04/12/21 1214            Follow-up Information     Stechschulte, Nickola Major, MD. Schedule an appointment as soon as possible for a visit in 4 week(s).   Specialty: Surgery Contact information: [place] [place] [address] [phone]                Allergies  Allergen Reactions   Codeine Other (See Comments)    Difficult breathing and skin problem   Ultram [Tramadol] Other (See Comments)    Difficult breathing and skin peeling   Januvia [Sitagliptin] Rash and Other (See Comments)    Blisters    Consultations: General surgery Infectious Disease   Procedures/Studies: MR SACRUM SI JOINTS WO CONTRAST  Result Date: 03/26/2021 CLINICAL DATA:  Sacral wound. EXAM: MRI SACRUM WITHOUT CONTRAST TECHNIQUE: Multiplanar, multisequence MR imaging of the sacrum was performed. No intravenous contrast was administered. COMPARISON:  CT pelvis dated October 24, 2017. MRI pelvis dated December 23, 2016. FINDINGS: Bones/Joint/Cartilage Abnormal marrow edema with corresponding decreased T1 marrow signal involving the inferior sacrum. Significantly eroded coccyx. These findings are new since 2019. No fracture or dislocation. Mild degenerative changes of the sacroiliac joints. No joint effusion. Muscles and Tendons Diffuse muscle atrophy. Soft tissue Deep midline sacral decubitus ulcer extending to the remnant sacrococcygeal junction and coccyx. Prominent presacral soft tissue edema. Superficial 0.8 x 2.3 x 3.0 cm gas and fluid collection just right of midline at the level of the upper sacrum (series 11, image 13; series 7, image 15). No soft tissue mass. Suprapubic catheter in the bladder.  IMPRESSION: 1.  Deep midline sacral decubitus ulcer extending to bone with underlying acute on chronic osteomyelitis of the inferior sacrum and coccyx. 2. 3.0 cm abscess just right of midline at the level of the upper sacrum, superior to the decubitus ulcer. Electronically Signed   By: Titus Dubin M.D.   On: 03/26/2021 08:25   DG Chest Port 1 View  Result Date: 04/05/2021 CLINICAL DATA:  Check PICC location EXAM: PORTABLE CHEST 1 VIEW COMPARISON:  03/06/2021 FINDINGS: RIGHT PICC line with tip in the distal SVC at the cavoatrial junction. Stable cardiac silhouette. Mild atelectasis in the LEFT midlung. IMPRESSION: PICC line appears in good position. Electronically Signed   By: Suzy Bouchard M.D.   On: 04/05/2021 14:59      Subjective: She denies any pain. She does not have any new complaints  Discharge Exam: Vitals:   04/11/21 0447 04/11/21 1342 04/11/21 2108 04/12/21 0443  BP: 113/66 115/66 107/75 114/63  Pulse: 84 98 96 87  Resp: _0 Temp: 98 F (36.7 C) 98.1 F (36.7 C) 98.4 F (36.9 C) 97.9 F (36.6 C)  TempSrc: Oral Oral Oral Oral  SpO2: 100% 98% 94% 97%  Weight:      Height:        General: Pt is alert, awake, not in acute distress Cardiovascular: RRR, S1/S2 +, no rubs, no gallops Respiratory: CTA bilaterally, no wheezing, no rhonchi Abdominal: Soft, NT, ND, bowel sounds +, ostomy in place Extremities: 1+ edema, no cyanosis    The results of significant diagnostics from this hospitalization (including imaging, microbiology, ancillary and laboratory) are listed below for reference.     Microbiology: Recent Results (from the past 240 hour(s))  Resp Panel by RT-PCR (Flu A&B, Covid) Nasopharyngeal Swab     Status: None   Collection Time: 04/05/21  3:41 PM   Specimen: Nasopharyngeal Swab; Nasopharyngeal(NP) swabs in vial transport medium  Result Value Ref Range Status   SARS Coronavirus 2 by RT PCR NEGATIVE NEGATIVE Final    Comment: (NOTE) SARS-CoV-2 target nucleic  acids are NOT DETECTED.  The SARS-CoV-2 RNA is generally detectable in upper respiratory specimens during the acute phase of infection. The lowest concentration of SARS-CoV-2 viral copies this assay can detect is 138 copies/mL. A negative result does not preclude SARS-Cov-2 infection and should not be used as the sole basis for treatment or other patient management decisions. A negative result may occur with  improper specimen collection/handling, submission of specimen other than nasopharyngeal swab, presence of viral mutation(s) within the areas targeted by this assay, and inadequate number of viral copies(<138 copies/mL). A negative result must be combined with clinical observations, patient history, and epidemiological information. The expected result is Negative.  Fact Sheet for Patients:  EntrepreneurPulse.com.au  Fact Sheet for Healthcare Providers:  IncredibleEmployment.be  This test is no t yet approved or cleared by the [place] FDA and  has been authorized for detection and/or diagnosis of SARS-CoV-2 by FDA under an Emergency Use Authorization (EUA). This EUA will remain  in effect (meaning this test can be used) for the duration of the COVID-19 declaration under Section 564(b)(1) of the Act, 21 U.S.C.section 360bbb-3(b)(1), unless the authorization is terminated  or revoked sooner.       Influenza A by PCR NEGATIVE NEGATIVE Final   Influenza B by PCR NEGATIVE NEGATIVE Final    Comment: (NOTE) The Xpert Xpress SARS-CoV-2/FLU/RSV plus assay is intended as an aid in the diagnosis of influenza from Nasopharyngeal swab  specimens and should not be used as a sole basis for treatment. Nasal washings and aspirates are unacceptable for Xpert Xpress SARS-CoV-2/FLU/RSV testing.  Fact Sheet for Patients: EntrepreneurPulse.com.au  Fact Sheet for Healthcare Providers: IncredibleEmployment.be  This  test is not yet approved or cleared by the [place] FDA and has been authorized for detection and/or diagnosis of SARS-CoV-2 by FDA under an Emergency Use Authorization (EUA). This EUA will remain in effect (meaning this test can be used) for the duration of the COVID-19 declaration under Section 564(b)(1) of the Act, 21 U.S.C. section 360bbb-3(b)(1), unless the authorization is terminated or revoked.  Performed at [facility], [place] [address], [place], [place] [address]   Surgical pcr screen     Status: Abnormal   Collection Time: 04/07/21  9:24 AM   Specimen: Nasal Mucosa; Nasal Swab  Result Value Ref Range Status   MRSA, PCR POSITIVE (A) NEGATIVE Final    Comment: RESULT CALLED TO, READ BACK BY AND VERIFIED WITH: Clotilde Dieter, RN 04/07/21 1041 KDS    Staphylococcus aureus POSITIVE (A) NEGATIVE Final    Comment: (NOTE) The Xpert SA Assay (FDA approved for NASAL specimens in patients 4 years of age and older), is one component of a comprehensive surveillance program. It is not intended to diagnose infection nor to guide or monitor treatment. Performed at [facility], [place] [address] [place], [place] [address]   Aerobic/Anaerobic Culture w Gram Stain (surgical/deep wound)     Status: None (Preliminary result)   Collection Time: 04/07/21 12:08 PM   Specimen: PATH Bone resection; Tissue  Result Value Ref Range Status   Specimen Description   Final    SACRAL BONE Performed at [place] [address], [place], [place] [address]    Special Requests   Final    NONE Performed at [facility], [place] [address], [place], [place] [address]    Gram Stain   Final    FEW SQUAMOUS EPITHELIAL CELLS PRESENT FEW WBC SEEN FEW GRAM NEGATIVE RODS Performed at [facility], [place] [address], [place], [place] [address]    Culture   Final    ABUNDANT PROTEUS MIRABILIS ABUNDANT ESCHERICHIA COLI NO  ANAEROBES ISOLATED; CULTURE IN PROGRESS FOR 5 DAYS    Report Status PENDING  Incomplete   Organism ID, Bacteria PROTEUS MIRABILIS  Final   Organism ID, Bacteria ESCHERICHIA COLI  Final      Susceptibility   Escherichia coli - MIC*    AMPICILLIN >=32 RESISTANT Resistant     CEFAZOLIN <=4 SENSITIVE Sensitive     CEFEPIME <=0.12 SENSITIVE Sensitive     CEFTAZIDIME <=1 SENSITIVE Sensitive     CEFTRIAXONE <=0.25 SENSITIVE Sensitive     CIPROFLOXACIN >=4 RESISTANT Resistant     GENTAMICIN >=16 RESISTANT Resistant     IMIPENEM <=0.25 SENSITIVE Sensitive     TRIMETH/SULFA <=20 SENSITIVE Sensitive     AMPICILLIN/SULBACTAM 16 INTERMEDIATE Intermediate     PIP/TAZO <=4 SENSITIVE Sensitive     * ABUNDANT ESCHERICHIA COLI   Proteus mirabilis - MIC*    AMPICILLIN >=32 RESISTANT Resistant     CEFAZOLIN >=64 RESISTANT Resistant     CEFEPIME <=0.12 SENSITIVE Sensitive     CEFTAZIDIME 16 INTERMEDIATE Intermediate     CEFTRIAXONE 2 INTERMEDIATE Intermediate     CIPROFLOXACIN >=4 RESISTANT Resistant     GENTAMICIN <=1 SENSITIVE Sensitive     IMIPENEM <=0.25 SENSITIVE Sensitive     TRIMETH/SULFA <=20  SENSITIVE Sensitive     AMPICILLIN/SULBACTAM >=32 RESISTANT Resistant     PIP/TAZO 64 INTERMEDIATE Intermediate     * ABUNDANT PROTEUS MIRABILIS     Labs: BNP (last 3 results) No results for input(s): BNP in the last 8760 hours. Basic Metabolic Panel: Recent Labs  Lab 04/06/21 0535 04/07/21 0645 04/09/21 0453 04/10/21 0501 04/11/21 0510 04/12/21 0625  NA 139 143 138 139 141 142  K 2.8* 2.9* 2.9* 3.6 3.7 3.6  CL 99 100 94* 95* 94* 93*  CO2 33* 35* 37* 36* 41* 41*  GLUCOSE 80 99 125* 160* 84 84  BUN 10 6* _0 CREATININE 0.38* 0.43* 0.59 0.49 0.45 0.35*  CALCIUM 8.2* 8.2* 8.0* 8.1* 8.8* 8.9  MG 1.8  --   --   --   --   --    Liver Function Tests: No results for input(s): AST, ALT, ALKPHOS, BILITOT, PROT, ALBUMIN in the last 168 hours. No results for input(s): LIPASE, AMYLASE in  the last 168 hours. No results for input(s): AMMONIA in the last 168 hours. CBC: Recent Labs  Lab 04/05/21 1439 04/06/21 0535 04/07/21 0645 04/09/21 0453 04/10/21 0501 04/11/21 0510 04/12/21 0625  WBC 16.9*   < > 11.7* 13.4* 16.7* 15.6* 14.7*  NEUTROABS 14.9*  --   --   --   --   --   --   HGB 9.8*   < > 8.8* 8.3* 8.4* 8.5* 8.5*  HCT 31.2*   < > 27.5* 26.5* 26.5* 27.0* 26.1*  MCV 85.7   < > 85.1 86.0 86.6 86.0 83.7  PLT 585*   < > 498* 472* 487* 490* 475*   < > = values in this interval not displayed.   Cardiac Enzymes: No results for input(s): CKTOTAL, CKMB, CKMBINDEX, TROPONINI in the last 168 hours. BNP: Invalid input(s): POCBNP CBG: Recent Labs  Lab 04/11/21 1141 04/11/21 1621 04/11/21 2106 04/12/21 0724 04/12/21 1107  GLUCAP 141* 135* 172* 79 164*   D-Dimer No results for input(s): DDIMER in the last 72 hours. Hgb A1c No results for input(s): HGBA1C in the last 72 hours. Lipid Profile No results for input(s): CHOL, HDL, LDLCALC, TRIG, CHOLHDL, LDLDIRECT in the last 72 hours. Thyroid function studies No results for input(s): TSH, T4TOTAL, T3FREE, THYROIDAB in the last 72 hours.  Invalid input(s): FREET3 Anemia work up No results for input(s): VITAMINB12, FOLATE, FERRITIN, TIBC, IRON, RETICCTPCT in the last 72 hours. Urinalysis    Component Value Date/Time   COLORURINE YELLOW 11/08/2017 2312   APPEARANCEUR CLOUDY (A) 11/08/2017 2312   LABSPEC 1.011 11/08/2017 2312   PHURINE 6.0 11/08/2017 2312   GLUCOSEU NEGATIVE 11/08/2017 2312   HGBUR LARGE (A) 11/08/2017 2312   BILIRUBINUR NEGATIVE 11/08/2017 2312   KETONESUR NEGATIVE 11/08/2017 2312   PROTEINUR 100 (A) 11/08/2017 2312   UROBILINOGEN 1.0 08/04/2012 1938   NITRITE POSITIVE (A) 11/08/2017 2312   LEUKOCYTESUR LARGE (A) 11/08/2017 2312   Sepsis Labs Invalid input(s): PROCALCITONIN,  WBC,  LACTICIDVEN Microbiology Recent Results (from the past 240 hour(s))  Resp Panel by RT-PCR (Flu A&B, Covid)  Nasopharyngeal Swab     Status: None   Collection Time: 04/05/21  3:41 PM   Specimen: Nasopharyngeal Swab; Nasopharyngeal(NP) swabs in vial transport medium  Result Value Ref Range Status   SARS Coronavirus 2 by RT PCR NEGATIVE NEGATIVE Final    Comment: (NOTE) SARS-CoV-2 target nucleic acids are NOT DETECTED.  The SARS-CoV-2 RNA is generally detectable in upper respiratory  specimens during the acute phase of infection. The lowest concentration of SARS-CoV-2 viral copies this assay can detect is 138 copies/mL. A negative result does not preclude SARS-Cov-2 infection and should not be used as the sole basis for treatment or other patient management decisions. A negative result may occur with  improper specimen collection/handling, submission of specimen other than nasopharyngeal swab, presence of viral mutation(s) within the areas targeted by this assay, and inadequate number of viral copies(<138 copies/mL). A negative result must be combined with clinical observations, patient history, and epidemiological information. The expected result is Negative.  Fact Sheet for Patients:  EntrepreneurPulse.com.au  Fact Sheet for Healthcare Providers:  [employer]redible[employer]  This test is no t yet approved or cleared by the [place] FDA and  has been authorized for detection and/or diagnosis of SARS-CoV-2 by FDA under an Emergency Use Authorization (EUA). This EUA will remain  in effect (meaning this test can be used) for the duration of the COVID-19 declaration under Section 564(b)(1) of the Act, 21 U.S.C.section 360bbb-3(b)(1), unless the authorization is terminated  or revoked sooner.       Influenza A by PCR NEGATIVE NEGATIVE Final   Influenza B by PCR NEGATIVE NEGATIVE Final    Comment: (NOTE) The Xpert Xpress SARS-CoV-2/FLU/RSV plus assay is intended as an aid in the diagnosis of influenza from Nasopharyngeal swab specimens and should not be  used as a sole basis for treatment. Nasal washings and aspirates are unacceptable for Xpert Xpress SARS-CoV-2/FLU/RSV testing.  Fact Sheet for Patients: EntrepreneurPulse.com.au  Fact Sheet for Healthcare Providers: IncredibleEmployment.be  This test is not yet approved or cleared by the [place] FDA and has been authorized for detection and/or diagnosis of SARS-CoV-2 by FDA under an Emergency Use Authorization (EUA). This EUA will remain in effect (meaning this test can be used) for the duration of the COVID-19 declaration under Section 564(b)(1) of the Act, 21 U.S.C. section 360bbb-3(b)(1), unless the authorization is terminated or revoked.  Performed at [facility], [place] [address], [place], [place] [address]   Surgical pcr screen     Status: Abnormal   Collection Time: 04/07/21  9:24 AM   Specimen: Nasal Mucosa; Nasal Swab  Result Value Ref Range Status   MRSA, PCR POSITIVE (A) NEGATIVE Final    Comment: RESULT CALLED TO, READ BACK BY AND VERIFIED WITH: Clotilde Dieter, RN 04/07/21 1041 KDS    Staphylococcus aureus POSITIVE (A) NEGATIVE Final    Comment: (NOTE) The Xpert SA Assay (FDA approved for NASAL specimens in patients 51 years of age and older), is one component of a comprehensive surveillance program. It is not intended to diagnose infection nor to guide or monitor treatment. Performed at [facility], [place] [address] [place], [place] [address]   Aerobic/Anaerobic Culture w Gram Stain (surgical/deep wound)     Status: None (Preliminary result)   Collection Time: 04/07/21 12:08 PM   Specimen: PATH Bone resection; Tissue  Result Value Ref Range Status   Specimen Description   Final    SACRAL BONE Performed at [place] [address], [place], [place] [address]    Special Requests   Final    NONE Performed at [facility], [place] [address]  [place], [place] [address]    Gram Stain   Final    FEW SQUAMOUS EPITHELIAL CELLS PRESENT FEW WBC SEEN FEW GRAM NEGATIVE RODS Performed at [facility], [place] [address] [place] [address] [place], [place] [address]  Culture   Final    ABUNDANT PROTEUS MIRABILIS ABUNDANT ESCHERICHIA COLI NO ANAEROBES ISOLATED; CULTURE IN PROGRESS FOR 5 DAYS    Report Status PENDING  Incomplete   Organism ID, Bacteria PROTEUS MIRABILIS  Final   Organism ID, Bacteria ESCHERICHIA COLI  Final      Susceptibility   Escherichia coli - MIC*    AMPICILLIN >=32 RESISTANT Resistant     CEFAZOLIN <=4 SENSITIVE Sensitive     CEFEPIME <=0.12 SENSITIVE Sensitive     CEFTAZIDIME <=1 SENSITIVE Sensitive     CEFTRIAXONE <=0.25 SENSITIVE Sensitive     CIPROFLOXACIN >=4 RESISTANT Resistant     GENTAMICIN >=16 RESISTANT Resistant     IMIPENEM <=0.25 SENSITIVE Sensitive     TRIMETH/SULFA <=20 SENSITIVE Sensitive     AMPICILLIN/SULBACTAM 16 INTERMEDIATE Intermediate     PIP/TAZO <=4 SENSITIVE Sensitive     * ABUNDANT ESCHERICHIA COLI   Proteus mirabilis - MIC*    AMPICILLIN >=32 RESISTANT Resistant     CEFAZOLIN >=64 RESISTANT Resistant     CEFEPIME <=0.12 SENSITIVE Sensitive     CEFTAZIDIME 16 INTERMEDIATE Intermediate     CEFTRIAXONE 2 INTERMEDIATE Intermediate     CIPROFLOXACIN >=4 RESISTANT Resistant     GENTAMICIN <=1 SENSITIVE Sensitive     IMIPENEM <=0.25 SENSITIVE Sensitive     TRIMETH/SULFA <=20 SENSITIVE Sensitive     AMPICILLIN/SULBACTAM >=32 RESISTANT Resistant     PIP/TAZO 64 INTERMEDIATE Intermediate     * ABUNDANT PROTEUS MIRABILIS     Time coordinating discharge: 79mns  SIGNED:   JKathie Dike MD  [facility] 04/12/2021, 12:25 PM   If 7PM-7AM, please contact night-coverage [email or web address]

## 2021-04-13 DIAGNOSIS — Z9359 Other cystostomy status: Secondary | ICD-10-CM | POA: Diagnosis not present

## 2021-04-13 DIAGNOSIS — L89150 Pressure ulcer of sacral region, unstageable: Secondary | ICD-10-CM | POA: Diagnosis not present

## 2021-04-13 DIAGNOSIS — J9612 Chronic respiratory failure with hypercapnia: Secondary | ICD-10-CM | POA: Diagnosis not present

## 2021-04-13 DIAGNOSIS — M8660 Other chronic osteomyelitis, unspecified site: Secondary | ICD-10-CM | POA: Diagnosis not present

## 2021-04-13 LAB — BASIC METABOLIC PANEL
Anion gap: 7 (ref 5–15)
BUN: 9 mg/dL (ref 8–23)
CO2: 40 mmol/L — ABNORMAL HIGH (ref 22–32)
Calcium: 8.4 mg/dL — ABNORMAL LOW (ref 8.9–10.3)
Chloride: 89 mmol/L — ABNORMAL LOW (ref 98–111)
Creatinine, Ser: 0.61 mg/dL (ref 0.44–1.00)
GFR, Estimated: >60 mL/min (ref >60)
Glucose, Bld: 115 mg/dL — ABNORMAL HIGH (ref 70–99)
Potassium: 3.6 mmol/L (ref 3.5–5.1)
Sodium: 136 mmol/L (ref 135–145)

## 2021-04-13 LAB — CBC
HCT: 27.9 % — ABNORMAL LOW (ref 36.0–46.0)
Hemoglobin: 9 g/dL — ABNORMAL LOW (ref 12.0–15.0)
MCH: 26.9 pg (ref 26.0–34.0)
MCHC: 32.3 g/dL (ref 30.0–36.0)
MCV: 83.5 fL (ref 80.0–100.0)
Platelets: 525 10*3/uL — ABNORMAL HIGH (ref 150–400)
RBC: 3.34 MIL/uL — ABNORMAL LOW (ref 3.87–5.11)
RDW: 19.3 % — ABNORMAL HIGH (ref 11.5–15.5)
WBC: 15.4 10*3/uL — ABNORMAL HIGH (ref 4.0–10.5)
nRBC: 0 % (ref 0.0–0.2)

## 2021-04-13 LAB — GLUCOSE, CAPILLARY
Glucose-Capillary: 95 mg/dL (ref 70–99)
Glucose-Capillary: 131 mg/dL — ABNORMAL HIGH (ref 70–99)
Glucose-Capillary: 83 mg/dL (ref 70–99)
Glucose-Capillary: 142 mg/dL — ABNORMAL HIGH (ref 70–99)

## 2021-04-13 LAB — RESP PANEL BY RT-PCR (FLU A&B, COVID) ARPGX2
Influenza A by PCR: NEGATIVE
Influenza B by PCR: NEGATIVE
SARS Coronavirus 2 by RT PCR: NEGATIVE

## 2021-04-13 MED ORDER — FUROSEMIDE 20 MG PO TABS: 20.0000 mg | ORAL_TABLET | ORAL | Status: DC

## 2021-04-13 MED ORDER — HEPARIN SOD (PORK) LOCK FLUSH 100 UNIT/ML IV SOLN
500.0000 [IU] | Freq: Once | INTRAVENOUS | Status: AC
  Administered 2021-04-13: 500 [IU] via INTRAVENOUS
  Filled 2021-04-13: qty 5

## 2021-04-13 NOTE — Care Management Important Message (Signed)
Important Message  Patient Details IM Letter placed in the Patient's room. Name: Laura Roberts MRN: [id number] Date of Birth: 06-05-1941   Medicare Important Message Given:  Yes     Kerin Salen 04/13/2021, 10:46 AM

## 2021-04-13 NOTE — TOC Transition Note (Signed)
Transition of Care [facility]) - CM/SW Discharge Note   Patient Details  Name: Laura Roberts MRN: [id number] Date of Birth: 1940/12/09  [facility] of [facility]) CM/SW Contact:  Lynnell Catalan, RN Phone Number: 04/13/2021, 1:21 PM   Clinical Narrative:     Pt to dc back to [facility] today. [facility] to be contacted for transport. RN to call report to [phone].   Final next level of care: Long Term Nursing Home Barriers to Discharge: Continued Medical Work up   Discharge Plan and Services   Discharge Planning Services: CM Consult              Social Determinants of Health (SDOH) Interventions     Readmission Risk Interventions Readmission Risk Prevention Plan 04/11/2021  PCP or Specialist Appt within 3-5 Days Complete  HRI or [place] Complete  Social Work Consult for [place] Planning/Counseling Complete  Palliative Care Screening Not Applicable  Medication Review Press photographer) Complete  Some recent data might be hidden

## 2021-04-13 NOTE — NC FL2 (Addendum)
[place] LEVEL OF CARE SCREENING TOOL     IDENTIFICATION  Patient Name: Laura Roberts Birthdate: [birth year] Sex: female Admission Date (Current Location): 04/05/2021  [facility] and [place] Number:  Herbalist and Address:  [facility],  [place], [place]      Provider Number: [id number]  Attending Physician Name and Address:  Kathie Dike, MD  Relative Name and Phone Number:       Current Level of Care: Hospital Recommended Level of Care: [place] Prior Approval Number:    Date Approved/Denied:   PASRR Number:    Discharge Plan: SNF    Current Diagnoses: Patient Active Problem List   Diagnosis Date Noted   Pressure injury of skin 04/06/2021   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/05/2021   Stage 4 decubitus ulcer ([place]) 04/05/2021   Abscess 04/05/2021   Chronic osteomyelitis ([place]) 04/05/2021   Multinodular goiter 04/05/2021   Anemia in other chronic diseases classified elsewhere 04/05/2021   Chronic respiratory failure ([place]) 04/05/2021   Diarrhea of presumed infectious origin 03/13/2021   Protein-calorie malnutrition ([place]) 08/01/2020   Pruritus of scalp 04/13/2020   Essential (hemorrhagic) thrombocythemia ([place]) 10/28/2019   Recurrent depression ([place]) 01/14/2019   Decubitus ulcer 01/31/2018   Hypoxia 01/30/2018   Hyperthyroidism 01/14/2018   Chronic suprapubic catheter ([place]) 10/29/2017   Inguinal abscess 10/24/2017   Hypokalemia    Type 2 diabetes mellitus with diabetic neuropathy, with long-term current use of insulin ([place])    Hypomagnesemia    Complicated UTI (urinary tract infection) 10/09/2017   Sepsis ([place]) 11/22/2016   Leukocytosis    Paraplegia ([place]) 06/01/2014   Encounter for therapeutic drug monitoring 01/05/2014   Foot drop, bilateral 10/13/2012   Neurogenic bladder 09/02/2012   Neurogenic pain of lower extremity 09/02/2012   Essential hypertension 07/21/2012   HLD (hyperlipidemia)  07/21/2012   GERD (gastroesophageal reflux disease) 07/21/2012   Multiple sclerosis (HCC)     Orientation RESPIRATION BLADDER Height & Weight     Self, Time, Situation, Place  Normal Indwelling catheter (Suprapubic) Weight: 82.1 kg Height:  5' 2"  (157.5 cm)  BEHAVIORAL SYMPTOMS/MOOD NEUROLOGICAL BOWEL NUTRITION STATUS      Colostomy (New colostomy with red rubber retention rod 2pc. 2 and 3/4 inch ostomy pouching system with barrier ring.) Diet (Carb modified)  AMBULATORY STATUS COMMUNICATION OF NEEDS Skin   Extensive Assist Verbally Wound Vac (Large black foam kit-(LAWSON # [id number])  1 piece foam to sacrum, 1 piece foam to bridge to hip. change vac dressing M,W,F 127mHG negative pressure.)                       Personal Care Assistance Level of Assistance  Bathing, Dressing, Feeding Bathing Assistance: Maximum assistance Feeding assistance: Maximum assistance Dressing Assistance: Maximum assistance     Functional Limitations Info  Sight, Hearing, Speech Sight Info: Impaired        SPECIAL CARE FACTORS FREQUENCY                       Contractures Contractures Info: Not present    Additional Factors Info  Code Status, Allergies Code Status Info: DNR Allergies Info: Codeine, Ultram, Januvia           Current Medications (04/13/2021):  This is the current hospital active medication list Current Facility-Administered Medications  Medication Dose Route Frequency Provider Last Rate Last Admin   (feeding supplement) PROSource Plus liquid 30 mL  30 mL Oral TID BM Cristal Deer, MD   30 mL at 04/13/21 0953   acetaminophen (TYLENOL) tablet 650 mg  650 mg Oral Q6H PRN Edwin Dada, MD   650 mg at 04/12/21 2147   Or   acetaminophen (TYLENOL) suppository 650 mg  650 mg Rectal Q6H PRN Danford, Suann Larry, MD       ascorbic acid (VITAMIN C) tablet 500 mg  500 mg Oral BID Cristal Deer, MD   500 mg at 04/13/21 0950   baclofen (LIORESAL) tablet 10 mg   10 mg Oral Q1400 Edwin Dada, MD   10 mg at 04/12/21 1519   baclofen (LIORESAL) tablet 15 mg  15 mg Oral Q breakfast Edwin Dada, MD   15 mg at 04/13/21 6712   baclofen (LIORESAL) tablet 20 mg  20 mg Oral QHS Edwin Dada, MD   20 mg at 04/12/21 2146   bisacodyl (DULCOLAX) suppository 10 mg  10 mg Rectal Daily PRN Edwin Dada, MD       Chlorhexidine Gluconate Cloth 2 % PADS 6 each  6 each Topical Daily Kathie Dike, MD   6 each at 04/13/21 0953   darifenacin (ENABLEX) 24 hr tablet 15 mg  15 mg Oral Daily Edwin Dada, MD   15 mg at 04/13/21 0950   Dimethyl Fumarate CPDR 240 mg  240 mg Oral BID Edwin Dada, MD   240 mg at 04/12/21 0734   DULoxetine (CYMBALTA) DR capsule 90 mg  90 mg Oral BID Edwin Dada, MD   90 mg at 04/13/21 0950   enoxaparin (LOVENOX) injection 40 mg  40 mg Subcutaneous Q24H Edwin Dada, MD   40 mg at 04/13/21 0953   ertapenem [facility]) 1,000 mg in sodium chloride 0.9 % 100 mL IVPB  1 g Intravenous Q24H Mignon Pine, DO 200 mL/hr at 04/12/21 1522 1,000 mg at 04/12/21 1522   feeding supplement (GLUCERNA SHAKE) (GLUCERNA SHAKE) liquid 237 mL  237 mL Oral BID BM Meuth, Brooke A, PA-C   237 mL at 04/13/21 0953   furosemide (LASIX) tablet 20 mg  20 mg Oral q AM Edwin Dada, MD   20 mg at 04/13/21 0803   gabapentin (NEURONTIN) capsule 200 mg  200 mg Oral TID Edwin Dada, MD   200 mg at 04/13/21 0950   HYDROmorphone (DILAUDID) injection 0.5 mg  0.5 mg Intravenous Q3H PRN Stechschulte, Nickola Major, MD   0.5 mg at 04/13/21 0806   influenza vaccine adjuvanted (FLUAD) injection 0.5 mL  0.5 mL Intramuscular Prior to discharge Fredia Sorrow, MD       insulin aspart (novoLOG) injection 0-15 Units  0-15 Units Subcutaneous TID WC Danford, Suann Larry, MD   3 Units at 04/12/21 1210   insulin aspart (novoLOG) injection 0-5 Units  0-5 Units Subcutaneous QHS Danford, Suann Larry, MD        insulin glargine-yfgn (SEMGLEE) injection 15 Units  15 Units Subcutaneous QHS Edwin Dada, MD   15 Units at 04/12/21 2146   magnesium hydroxide (MILK OF MAGNESIA) suspension 30 mL  30 mL Oral Daily PRN Edwin Dada, MD       methimazole (TAPAZOLE) tablet 5 mg  5 mg Oral Q0600 Edwin Dada, MD   5 mg at 04/13/21 0640   methocarbamol (ROBAXIN) 500 mg in dextrose 5 % 50 mL IVPB  500 mg Intravenous Q6H PRN Stechschulte, Nickola Major, MD  metoprolol succinate (TOPROL-XL) 24 hr tablet 50 mg  50 mg Oral QAC breakfast Edwin Dada, MD   50 mg at 04/13/21 5397   multivitamin with minerals tablet 1 tablet  1 tablet Oral Daily Cristal Deer, MD   1 tablet at 04/13/21 0950   ondansetron (ZOFRAN) tablet 4 mg  4 mg Oral Q6H PRN Edwin Dada, MD       Or   ondansetron (ZOFRAN) injection 4 mg  4 mg Intravenous Q6H PRN Edwin Dada, MD   4 mg at 04/11/21 2215   ondansetron (ZOFRAN) tablet 4 mg  4 mg Oral Q6H PRN Danford, Suann Larry, MD       oxyCODONE (Oxy IR/ROXICODONE) immediate release tablet 10 mg  10 mg Oral Q4H PRN Stechschulte, Nickola Major, MD       oxyCODONE (Oxy IR/ROXICODONE) immediate release tablet 5 mg  5 mg Oral Q4H PRN Stechschulte, Nickola Major, MD   5 mg at 04/09/21 2141   pantoprazole (PROTONIX) EC tablet 40 mg  40 mg Oral Q0600 Edwin Dada, MD   40 mg at 04/13/21 0641   phenol (CHLORASEPTIC) mouth spray 1 spray  1 spray Mouth/Throat PRN Cristal Deer, MD       predniSONE (DELTASONE) tablet 2.5 mg  2.5 mg Oral Q M,W,F,Su-1800 Danford, Suann Larry, MD   2.5 mg at 04/11/21 1814   predniSONE (DELTASONE) tablet 5 mg  5 mg Oral Q T,Th,Sat-1800 Danford, Suann Larry, MD   5 mg at 04/12/21 1814   protein supplement (ENSURE MAX) liquid  11 oz Oral Daily Cristal Deer, MD   11 oz at 04/13/21 0954   zinc sulfate capsule 220 mg  220 mg Oral Daily Cristal Deer, MD   220 mg at 04/13/21 6734     Discharge Medications: Please  see discharge summary for a list of discharge medications.  Relevant Imaging Results:  Relevant Lab Results:   Additional Information SSN: [id number]  Ertapenem 1g IV Q24hrs. End date 05/23/21 Needs pressure redistribution mattress  Leshonda Galambos, Marjie Skiff, RN

## 2021-04-13 NOTE — Consult Note (Signed)
[place] Nurse wound follow up Wound type:Stage 4 pressure injury to sacrum post-op debridement; refer to previous notes for measurements and description; appearance is unchanged since previous note.  Wound bed: beefy red, mod amt dark red drainage in the cannister.  Dressing procedure/placement/frequency: Pt was medicated for pain prior to the procedure and tolerated with minimal amt discomfort. Applied one piece black foam to inner wound bed, one barrier ring around wound edges to attempt to maintain a seal,  and one piece bridged to hip to apply trackpad away from sacrum. Therapy is initiated at 150mmHg continuous negative pressure and an immediate seal is attained.    [place] Nurse ostomy follow up Stoma type/location: LUQ loop colostomy with red rubber catheter retention rod.  Stomal assessment/size: pink, moist, edematous, raised, 2 inches.  Peristomal assessment: intact Treatment options for stomal/peristomal skin: skin barrier ring Output: mod amt liquid brown stool Ostomy pouching: Applied 2pc. 2 and 3/4 inch ostomy pouching system Kellie Simmering # 2, Lawson # [phone], Lawson # [phone]) with skin barrier ring. Education provided: Patient is not able to participate in her care and will require total assistance at a SNF after discharge. Reviewed the need to notify nursing staff when pouch was 1/3 to 1/2 full of flatus or stool.  Enrolled patient in [place] Start Discharge program: No; Disposition to SNF is anticipated.   Extra set of wafer/pouch/barrier rings left in the room.    [place] nursing team will follow, and will remain available to this patient, the nursing and medical teams.   Thank-you,  Julien Girt MSN, [place], [place], [place], [place]

## 2021-04-13 NOTE — Discharge Summary (Signed)
Physician Discharge Summary  Laura Roberts YOV:[id number] DOB: Aug 02, 1940 DOA: 04/05/2021  PCP: [name], MD  Admit date: 04/05/2021 Discharge date: 04/13/2021  Admitted From: SNF Disposition:  SNF  Recommendations for Outpatient Follow-up:  Continue on IV ertapenem through PICC on discharge, end date is 05/23/2021 Follow up with Dr. Drucilla Schmidt in [facility] on 05/10/21 at 10:45am Continue wound vac for sacral wound with vac change every MWF until wound filled with granulation tissue. Follow up at wound care center Follow up with general surgery, Dr. Thermon Leyland in 4 weeks for ostomy follow up Repeat CBC in 1 week  Discharge Condition:stable CODE STATUS:DNR Diet recommendation: heart healthy/carb modified  Brief/Interim Summary: This is a 80 year old female resident of [facility] with history of multiple sclerosis on Tecfidera, paraplegia bedbound with suprapubic catheter in place for neurogenic bladder, diabetes mellitus type 2, multinodular goiter on Tapazole who presented from the ID clinic for surgery consultation for decubitus ulcer she had had extensive work-up in months prior She was found to have leukocytosis due to chronic stage IV sacral decubitus ulcer MRI of the sacrum showed erosion of the sacrum adjacent to a large ulcer as well as 3 cm abscess she was referred to ID who recommended direct admission to hospital for surgical debridement and deep culture, surgery was consulted who took her to the OR  for debridement and performed diverting colostomy. Based on cultures results, she is being treated with 6 weeks of ertapenem  Discharge Diagnoses:  Principal Problem:   Chronic osteomyelitis ([place]) Active Problems:   Multiple sclerosis ([place])   Essential hypertension   Paraplegia (HCC)   Hypokalemia   Type 2 diabetes mellitus with diabetic neuropathy, with long-term current use of insulin (HCC)   Chronic suprapubic catheter (H[facility])   Decubitus ulcer   Multinodular goiter    Anemia in other chronic diseases classified elsewhere   Chronic respiratory failure (HCC)   Pressure injury of skin  Chronic osteomyelitis: Recent MRI shows bony erosion and a 3 cm abscess General surgery was consulted and she underwent wound debridement with a deep tissue culture.  Currently has wound VAC in place Infectious disease following Wound culture polymicrobial with Proteus and E coli Started on ertapenem for 6 weeks  Chronic respiratory failure Patient is on nasal cannula O2  Multiple sclerosis Continue prednisone and Tecfidera Continue baclofen  Type 2 diabetes mellitus Continue Lantus Resume home Trulicity on discharge   Hypertension with mild lower extremity edema Continue Lasix and metoprolol  Multinodular goiter Follow-up with Dr. Loanne Drilling Continue Tapazole  Paraplegia secondary to multiple sclerosis Suprapubic catheter is in place Continue duloxetine gabapentin and baclofen  Anemia of chronic disease Hemoglobin is stable  Leukocytosis.  Patient is afebrile WBC count remains mildly elevated, but stable. She is afebrile Infectious disease following and based on wound culture results, she will be started on ertapenem for 6 weeks.  Repeat cbc in 1 week  Discharge Instructions  Discharge Instructions     [facility] pharmacist to adjust dose for Vancomycin, Aminoglycosides and other anti-infective therapies as requested by physician.   Complete by: As directed    [facility] to provide Cath Flo $Remove'2mg'aJeSrDN$    Complete by: As directed    Administer for PICC line occlusion and as ordered by physician for other access device issues.   Anaphylaxis Kit: Provided to treat any anaphylactic reaction to the medication being provided to the patient if First Dose or when requested by physician   Complete by: As directed  Epinephrine 34m/ml vial / amp: Administer 0.31035m(0.35m58msubcutaneously once for moderate to severe anaphylaxis, nurse to call  physician and pharmacy when reaction occurs and call 911 if needed for immediate care   Diphenhydramine 106m51m IV vial: Administer 25-106mg48mIM PRN for first dose reaction, rash, itching, mild reaction, nurse to call physician and pharmacy when reaction occurs   Sodium Chloride 0.9% NS 500ml 74mAdminister if needed for hypovolemic blood pressure drop or as ordered by physician after call to physician with anaphylactic reaction   Change dressing on IV access line weekly and PRN   Complete by: As directed    Diet - low sodium heart healthy   Complete by: As directed    Diet - low sodium heart healthy   Complete by: As directed    Discharge wound care:   Complete by: As directed    Negative pressure wound therapy  Every Mon-Wed-Fri 1000    Comments: Large black foam kit- 1 piece foam to sacrum, 1 piece foam to bridge to hip.   Barrier ring /skin prep to periwound skin Question:  Amount of suction?  Answer:  125 mm/Hg   Discharge wound care:   Complete by: As directed    Wound vac to sacral wound to be changed MWF   Flush IV access with Sodium Chloride 0.9% and Heparin 10 units/ml or 100 units/ml   Complete by: As directed    Home infusion instructions - [facility]   Complete by: As directed    Instructions: Flush IV access with Sodium Chloride 0.9% and Heparin 10units/ml or 100units/ml   Change dressing on IV access line: Weekly and PRN   Instructions Cath Flo 2mg: A17mnister for PICC Line occlusion and as ordered by physician for other access device   [facility] pharmacist to adjust dose for: Vancomycin, Aminoglycosides and other anti-infective therapies as requested by physician   Increase activity slowly   Complete by: As directed    Increase activity slowly   Complete by: As directed    Method of administration may be changed at the discretion of home infusion pharmacist based upon assessment of the patient and/or caregiver's ability to self-administer the  medication ordered   Complete by: As directed    Outpatient Parenteral Antibiotic Therapy Information Antibiotic: Ertapenem (Invanz) IVPB; Indications for use: Sacral Osteomyelitis; End Date: 06/11/2021   Complete by: As directed    Antibiotic: Ertapenem (InvanzColbert Ewing  Indications for use: Sacral Osteomyelitis   End Date: 06/11/2021      Allergies as of 04/13/2021       Reactions   Codeine Other (See Comments)   Difficult breathing and skin problem   Ultram [tramadol] Other (See Comments)   Difficult breathing and skin peeling   Januvia [sitagliptin] Rash, Other (See Comments)   Blisters        Medication List     STOP taking these medications    ALPRAZolam 0.5 MG tablet Commonly known as: XANAX   vancomycin 1,500 mg in sodium chloride 0.9 % 250 mL       TAKE these medications    acetaminophen 325 MG tablet Commonly known as: TYLENOL Take 650 mg by mouth every 6 (six) hours as needed for mild pain or fever.   baclofen 10 MG tablet Commonly known as: LIORESAL Take 10-15 mg by mouth daily. (0800 & 1200) Take 1.5 tablets (15 mg) by mouth in the morning & take 1 tablet (10 mg) by mouth at noon.  baclofen 20 MG tablet Commonly known as: LIORESAL Take 20 mg by mouth at bedtime. (2000)   barrier cream Crea Commonly known as: non-specified Apply 1 application topically 2 (two) times daily. (0700 & 1900) Apply to right ischium and bilateral buttocks   calcium citrate 950 (200 Ca) MG tablet Commonly known as: CALCITRATE - dosed in mg elemental calcium Take 200 mg of elemental calcium by mouth daily.   cholecalciferol 25 MCG (1000 UNIT) tablet Commonly known as: VITAMIN D Take 1,000 Units by mouth daily.   cycloSPORINE 0.05 % ophthalmic emulsion Commonly known as: RESTASIS Place 1 drop into both eyes 2 (two) times daily. (0800 & 2000)   DULoxetine 30 MG capsule Commonly known as: CYMBALTA Take 3 capsules (90 mg total) by mouth 2 (two) times daily. (0800)Give  along with 30 mg to = 90 mg What changed:  medication strength how much to take   ertapenem  IVPB Commonly known as: INVANZ Inject 1 g into the vein daily. Indication:  Sacral osteomyelitis  First Dose: No Last Day of Therapy:  05/23/21 Labs - Once weekly:  CBC/D and BMP, Labs - Every other week:  ESR and CRP Method of administration: Mini-Bag Plus / Gravity Method of administration may be changed at the discretion of home infusion pharmacist based upon assessment of the patient and/or caregiver's ability to self-administer the medication ordered.   feeding supplement (GLUCERNA SHAKE) Liqd Take 237 mLs by mouth 2 (two) times daily between meals.   (feeding supplement) PROSource Plus liquid Take 30 mLs by mouth 3 (three) times daily between meals.   feeding supplement (PRO-STAT SUGAR FREE 64) Liqd Take 30 mLs by mouth 2 (two) times daily.   ferrous sulfate 325 (65 FE) MG tablet Take 325 mg by mouth 2 (two) times daily with a meal.   furosemide 20 MG tablet Commonly known as: LASIX Take 1 tablet (20 mg total) by mouth every other day. What changed: when to take this   gabapentin 100 MG capsule Commonly known as: NEURONTIN Take 200 mg by mouth 3 (three) times daily.   guaiFENesin 600 MG 12 hr tablet Commonly known as: MUCINEX Take 1,200 mg by mouth at bedtime. (2000)   insulin glargine 100 UNIT/ML Solostar Pen Commonly known as: LANTUS Inject 18 Units into the skin at bedtime. (2000)   loperamide 2 MG capsule Commonly known as: IMODIUM Take 4 mg by mouth daily as needed for diarrhea or loose stools (SUBSEQUENT DOSE GIVE ONE TAB AFTER EACH STOOL (max 8 mg/24 hrs.)).   magnesium oxide 400 MG tablet Commonly known as: MAG-OX Take 400 mg by mouth 2 (two) times daily.   methimazole 5 MG tablet Commonly known as: TAPAZOLE Take 1 tablet (5 mg total) by mouth daily. What changed:  when to take this additional instructions   metoprolol succinate 50 MG 24 hr  tablet Commonly known as: TOPROL-XL Take 50 mg by mouth daily.   multivitamin with minerals Tabs tablet Take 1 tablet by mouth daily.   ondansetron 4 MG tablet Commonly known as: ZOFRAN Take 4 mg by mouth every 6 (six) hours as needed for nausea or vomiting.   OPTIVE 0.5-0.9 % ophthalmic solution Generic drug: carboxymethylcellul-glycerin Place 1 drop into both eyes 4 (four) times daily as needed for dry eyes. (0800,1200, 1600 & 2000)   oxyCODONE 5 MG immediate release tablet Commonly known as: Oxy IR/ROXICODONE Take 1 tablet (5 mg total) by mouth every 6 (six) hours as needed for moderate pain.  pantoprazole 40 MG tablet Commonly known as: PROTONIX Take 40 mg by mouth daily at 6 (six) AM. (0600)   polyethylene glycol 17 g packet Commonly known as: MIRALAX / GLYCOLAX Take 17 g by mouth daily as needed. What changed:  when to take this reasons to take this   potassium chloride SA 20 MEQ tablet Commonly known as: KLOR-CON Take 20 mEq by mouth 2 (two) times daily.   predniSONE 5 MG tablet Commonly known as: DELTASONE Take 5 mg by mouth every other day. (0800) Alternating days with 2.5 mg dose   predniSONE 2.5 MG tablet Commonly known as: DELTASONE Take 2.5 mg by mouth every other day. (0800) Alternating days with 5 mg dose   saccharomyces boulardii 250 MG capsule Commonly known as: FLORASTOR Take 250 mg by mouth 2 (two) times daily.   SF 5000 Plus 1.1 % Crea dental cream Generic drug: sodium fluoride Take 1 application by mouth daily at 8 pm. (2000)   solifenacin 10 MG tablet Commonly known as: VESICARE Take 10 mg by mouth daily. (0800)   Tecfidera 240 MG Cpdr Generic drug: Dimethyl Fumarate Take 1 capsule (240 mg total) by mouth 2 (two) times daily. (8416 & 6063)   Trulicity 3 KZ/6.0FU Sopn Generic drug: Dulaglutide Inject 3 mg into the skin every Friday. (0800)   vitamin C 500 MG tablet Commonly known as: ASCORBIC ACID Take 500 mg by mouth in the  morning and at bedtime.   zinc sulfate 220 (50 Zn) MG capsule Take 220 mg by mouth daily.               Discharge Care Instructions  (From admission, onward)           Start     Ordered   04/13/21 0000  Discharge wound care:       Comments: Wound vac to sacral wound to be changed MWF   04/13/21 1334   04/12/21 0000  Change dressing on IV access line weekly and PRN  (Home infusion instructions - [facility] )        04/12/21 1214   04/12/21 0000  Discharge wound care:       Comments: Negative pressure wound therapy  Every Mon-Wed-Fri 1000    Comments: Large black foam kit- 1 piece foam to sacrum, 1 piece foam to bridge to hip.   Barrier ring /skin prep to periwound skin Question:  Amount of suction?  Answer:  125 mm/Hg   04/12/21 1214            Follow-up Information     Stechschulte, Nickola Major, MD. Schedule an appointment as soon as possible for a visit in 4 week(s).   Specialty: Surgery Contact information: [place] [place] [address] [phone]                Allergies  Allergen Reactions   Codeine Other (See Comments)    Difficult breathing and skin problem   Ultram [Tramadol] Other (See Comments)    Difficult breathing and skin peeling   Januvia [Sitagliptin] Rash and Other (See Comments)    Blisters    Consultations: General surgery Infectious Disease   Procedures/Studies: MR SACRUM SI JOINTS WO CONTRAST  Result Date: 03/26/2021 CLINICAL DATA:  Sacral wound. EXAM: MRI SACRUM WITHOUT CONTRAST TECHNIQUE: Multiplanar, multisequence MR imaging of the sacrum was performed. No intravenous contrast was administered. COMPARISON:  CT pelvis dated October 24, 2017. MRI pelvis dated December 23, 2016. FINDINGS:  Bones/Joint/Cartilage Abnormal marrow edema with corresponding decreased T1 marrow signal involving the inferior sacrum. Significantly eroded coccyx. These findings are new since 2019. No fracture or dislocation. Mild  degenerative changes of the sacroiliac joints. No joint effusion. Muscles and Tendons Diffuse muscle atrophy. Soft tissue Deep midline sacral decubitus ulcer extending to the remnant sacrococcygeal junction and coccyx. Prominent presacral soft tissue edema. Superficial 0.8 x 2.3 x 3.0 cm gas and fluid collection just right of midline at the level of the upper sacrum (series 11, image 13; series 7, image 15). No soft tissue mass. Suprapubic catheter in the bladder. IMPRESSION: 1. Deep midline sacral decubitus ulcer extending to bone with underlying acute on chronic osteomyelitis of the inferior sacrum and coccyx. 2. 3.0 cm abscess just right of midline at the level of the upper sacrum, superior to the decubitus ulcer. Electronically Signed   By: Titus Dubin M.D.   On: 03/26/2021 08:25   DG Chest Port 1 View  Result Date: 04/05/2021 CLINICAL DATA:  Check PICC location EXAM: PORTABLE CHEST 1 VIEW COMPARISON:  03/06/2021 FINDINGS: RIGHT PICC line with tip in the distal SVC at the cavoatrial junction. Stable cardiac silhouette. Mild atelectasis in the LEFT midlung. IMPRESSION: PICC line appears in good position. Electronically Signed   By: Suzy Bouchard M.D.   On: 04/05/2021 14:59      Subjective: She denies any pain. She does not have any new complaints  Discharge Exam: Vitals:   04/12/21 0443 04/12/21 1409 04/12/21 2122 04/13/21 0527  BP: 114/63 106/63 120/66 113/68  Pulse: 87 88 93 90  Resp: 14 17 18 16   Temp: 97.9 F (36.6 C) 98.2 F (36.8 C) 98.5 F (36.9 C) 97.6 F (36.4 C)  TempSrc: Oral Oral Oral Oral  SpO2: 97% 100% 96% 97%  Weight:      Height:        General: Pt is alert, awake, not in acute distress Cardiovascular: RRR, S1/S2 +, no rubs, no gallops Respiratory: CTA bilaterally, no wheezing, no rhonchi Abdominal: Soft, NT, ND, bowel sounds +, ostomy in place Extremities: 1+ edema, no cyanosis    The results of significant diagnostics from this hospitalization  (including imaging, microbiology, ancillary and laboratory) are listed below for reference.     Microbiology: Recent Results (from the past 240 hour(s))  Resp Panel by RT-PCR (Flu A&B, Covid) Nasopharyngeal Swab     Status: None   Collection Time: 04/05/21  3:41 PM   Specimen: Nasopharyngeal Swab; Nasopharyngeal(NP) swabs in vial transport medium  Result Value Ref Range Status   SARS Coronavirus 2 by RT PCR NEGATIVE NEGATIVE Final    Comment: (NOTE) SARS-CoV-2 target nucleic acids are NOT DETECTED.  The SARS-CoV-2 RNA is generally detectable in upper respiratory specimens during the acute phase of infection. The lowest concentration of SARS-CoV-2 viral copies this assay can detect is 138 copies/mL. A negative result does not preclude SARS-Cov-2 infection and should not be used as the sole basis for treatment or other patient management decisions. A negative result may occur with  improper specimen collection/handling, submission of specimen other than nasopharyngeal swab, presence of viral mutation(s) within the areas targeted by this assay, and inadequate number of viral copies(<138 copies/mL). A negative result must be combined with clinical observations, patient history, and epidemiological information. The expected result is Negative.  Fact Sheet for Patients:  EntrepreneurPulse.com.au  Fact Sheet for Healthcare Providers:  IncredibleEmployment.be  This test is no t yet approved or cleared by the [place] and  has been authorized for detection and/or diagnosis of SARS-CoV-2 by FDA under an Emergency Use Authorization (EUA). This EUA will remain  in effect (meaning this test can be used) for the duration of the COVID-19 declaration under Section 564(b)(1) of the Act, 21 U.S.C.section 360bbb-3(b)(1), unless the authorization is terminated  or revoked sooner.       Influenza A by PCR NEGATIVE NEGATIVE Final   Influenza B by PCR  NEGATIVE NEGATIVE Final    Comment: (NOTE) The Xpert Xpress SARS-CoV-2/FLU/RSV plus assay is intended as an aid in the diagnosis of influenza from Nasopharyngeal swab specimens and should not be used as a sole basis for treatment. Nasal washings and aspirates are unacceptable for Xpert Xpress SARS-CoV-2/FLU/RSV testing.  Fact Sheet for Patients: EntrepreneurPulse.com.au  Fact Sheet for Healthcare Providers: IncredibleEmployment.be  This test is not yet approved or cleared by the [place] FDA and has been authorized for detection and/or diagnosis of SARS-CoV-2 by FDA under an Emergency Use Authorization (EUA). This EUA will remain in effect (meaning this test can be used) for the duration of the COVID-19 declaration under Section 564(b)(1) of the Act, 21 U.S.C. section 360bbb-3(b)(1), unless the authorization is terminated or revoked.  Performed at [facility], [place] [address], [place], [place] [address]   Surgical pcr screen     Status: Abnormal   Collection Time: 04/07/21  9:24 AM   Specimen: Nasal Mucosa; Nasal Swab  Result Value Ref Range Status   MRSA, PCR POSITIVE (A) NEGATIVE Final    Comment: RESULT CALLED TO, READ BACK BY AND VERIFIED WITH: Clotilde Dieter, RN 04/07/21 1041 KDS    Staphylococcus aureus POSITIVE (A) NEGATIVE Final    Comment: (NOTE) The Xpert SA Assay (FDA approved for NASAL specimens in patients 68 years of age and older), is one component of a comprehensive surveillance program. It is not intended to diagnose infection nor to guide or monitor treatment. Performed at [facility], [place] [address] [place], [place] [address]   Aerobic/Anaerobic Culture w Gram Stain (surgical/deep wound)     Status: None   Collection Time: 04/07/21 12:08 PM   Specimen: PATH Bone resection; Tissue  Result Value Ref Range Status   Specimen Description   Final    SACRAL BONE Performed at  [place] [address], [place], [place] [address]    Special Requests   Final    NONE Performed at [facility] [place] [address], [place], [place] [address]    Gram Stain   Final    FEW SQUAMOUS EPITHELIAL CELLS PRESENT FEW WBC SEEN FEW GRAM NEGATIVE RODS    Culture   Final    ABUNDANT PROTEUS MIRABILIS ABUNDANT ESCHERICHIA COLI NO ANAEROBES ISOLATED Performed at [facility], [address] [place], [place] [address]    Report Status 04/12/2021 FINAL  Final   Organism ID, Bacteria PROTEUS MIRABILIS  Final   Organism ID, Bacteria ESCHERICHIA COLI  Final      Susceptibility   Escherichia coli - MIC*    AMPICILLIN >=32 RESISTANT Resistant     CEFAZOLIN <=4 SENSITIVE Sensitive     CEFEPIME <=0.12 SENSITIVE Sensitive     CEFTAZIDIME <=1 SENSITIVE Sensitive     CEFTRIAXONE <=0.25 SENSITIVE Sensitive     CIPROFLOXACIN >=4 RESISTANT Resistant     GENTAMICIN >=16 RESISTANT Resistant     IMIPENEM <=0.25 SENSITIVE Sensitive     TRIMETH/SULFA <=20 SENSITIVE Sensitive     AMPICILLIN/SULBACTAM  16 INTERMEDIATE Intermediate     PIP/TAZO <=4 SENSITIVE Sensitive     * ABUNDANT ESCHERICHIA COLI   Proteus mirabilis - MIC*    AMPICILLIN >=32 RESISTANT Resistant     CEFAZOLIN >=64 RESISTANT Resistant     CEFEPIME <=0.12 SENSITIVE Sensitive     CEFTAZIDIME 16 INTERMEDIATE Intermediate     CEFTRIAXONE 2 INTERMEDIATE Intermediate     CIPROFLOXACIN >=4 RESISTANT Resistant     GENTAMICIN <=1 SENSITIVE Sensitive     IMIPENEM <=0.25 SENSITIVE Sensitive     TRIMETH/SULFA <=20 SENSITIVE Sensitive     AMPICILLIN/SULBACTAM >=32 RESISTANT Resistant     PIP/TAZO 64 INTERMEDIATE Intermediate     * ABUNDANT PROTEUS MIRABILIS  Resp Panel by RT-PCR (Flu A&B, Covid) Nasopharyngeal Swab     Status: None   Collection Time: 04/13/21 10:40 AM   Specimen: Nasopharyngeal Swab; Nasopharyngeal(NP) swabs in vial transport medium  Result Value Ref Range Status    SARS Coronavirus 2 by RT PCR NEGATIVE NEGATIVE Final    Comment: (NOTE) SARS-CoV-2 target nucleic acids are NOT DETECTED.  The SARS-CoV-2 RNA is generally detectable in upper respiratory specimens during the acute phase of infection. The lowest concentration of SARS-CoV-2 viral copies this assay can detect is 138 copies/mL. A negative result does not preclude SARS-Cov-2 infection and should not be used as the sole basis for treatment or other patient management decisions. A negative result may occur with  improper specimen collection/handling, submission of specimen other than nasopharyngeal swab, presence of viral mutation(s) within the areas targeted by this assay, and inadequate number of viral copies(<138 copies/mL). A negative result must be combined with clinical observations, patient history, and epidemiological information. The expected result is Negative.  Fact Sheet for Patients:  EntrepreneurPulse.com.au  Fact Sheet for Healthcare Providers:  IncredibleEmployment.be  This test is no t yet approved or cleared by the [place] FDA and  has been authorized for detection and/or diagnosis of SARS-CoV-2 by FDA under an Emergency Use Authorization (EUA). This EUA will remain  in effect (meaning this test can be used) for the duration of the COVID-19 declaration under Section 564(b)(1) of the Act, 21 U.S.C.section 360bbb-3(b)(1), unless the authorization is terminated  or revoked sooner.       Influenza A by PCR NEGATIVE NEGATIVE Final   Influenza B by PCR NEGATIVE NEGATIVE Final    Comment: (NOTE) The Xpert Xpress SARS-CoV-2/FLU/RSV plus assay is intended as an aid in the diagnosis of influenza from Nasopharyngeal swab specimens and should not be used as a sole basis for treatment. Nasal washings and aspirates are unacceptable for Xpert Xpress SARS-CoV-2/FLU/RSV testing.  Fact Sheet for  Patients: EntrepreneurPulse.com.au  Fact Sheet for Healthcare Providers: IncredibleEmployment.be  This test is not yet approved or cleared by the [place] FDA and has been authorized for detection and/or diagnosis of SARS-CoV-2 by FDA under an Emergency Use Authorization (EUA). This EUA will remain in effect (meaning this test can be used) for the duration of the COVID-19 declaration under Section 564(b)(1) of the Act, 21 U.S.C. section 360bbb-3(b)(1), unless the authorization is terminated or revoked.  Performed at [facility], [place] [address] [place], [place] [address]      Labs: BNP (last 3 results) No results for input(s): BNP in the last 8760 hours. Basic Metabolic Panel: Recent Labs  Lab 04/09/21 0453 04/10/21 0501 04/11/21 0510 04/12/21 0625 04/13/21 0631  NA 138 139 141 142 136  K 2.9* 3.6 3.7 3.6 3.6  CL 94* 95*  94* 93* 89*  CO2 37* 36* 41* 41* 40*  GLUCOSE 125* 160* 84 84 115*  BUN 8 12 9 8 9   CREATININE 0.59 0.49 0.45 0.35* 0.61  CALCIUM 8.0* 8.1* 8.8* 8.9 8.4*   Liver Function Tests: No results for input(s): AST, ALT, ALKPHOS, BILITOT, PROT, ALBUMIN in the last 168 hours. No results for input(s): LIPASE, AMYLASE in the last 168 hours. No results for input(s): AMMONIA in the last 168 hours. CBC: Recent Labs  Lab 04/09/21 0453 04/10/21 0501 04/11/21 0510 04/12/21 0625 04/13/21 0631  WBC 13.4* 16.7* 15.6* 14.7* 15.4*  HGB 8.3* 8.4* 8.5* 8.5* 9.0*  HCT 26.5* 26.5* 27.0* 26.1* 27.9*  MCV 86.0 86.6 86.0 83.7 83.5  PLT 472* 487* 490* 475* 525*   Cardiac Enzymes: No results for input(s): CKTOTAL, CKMB, CKMBINDEX, TROPONINI in the last 168 hours. BNP: Invalid input(s): POCBNP CBG: Recent Labs  Lab 04/12/21 1107 04/12/21 1625 04/12/21 2121 04/13/21 0729 04/13/21 1145  GLUCAP 164* 82 91 95 131*   D-Dimer No results for input(s): DDIMER in the last 72 hours. Hgb A1c No results for  input(s): HGBA1C in the last 72 hours. Lipid Profile No results for input(s): CHOL, HDL, LDLCALC, TRIG, CHOLHDL, LDLDIRECT in the last 72 hours. Thyroid function studies No results for input(s): TSH, T4TOTAL, T3FREE, THYROIDAB in the last 72 hours.  Invalid input(s): FREET3 Anemia work up No results for input(s): VITAMINB12, FOLATE, FERRITIN, TIBC, IRON, RETICCTPCT in the last 72 hours. Urinalysis    Component Value Date/Time   COLORURINE YELLOW 11/08/2017 2312   APPEARANCEUR CLOUDY (A) 11/08/2017 2312   LABSPEC 1.011 11/08/2017 2312   PHURINE 6.0 11/08/2017 2312   GLUCOSEU NEGATIVE 11/08/2017 2312   HGBUR LARGE (A) 11/08/2017 2312   BILIRUBINUR NEGATIVE 11/08/2017 2312   KETONESUR NEGATIVE 11/08/2017 2312   PROTEINUR 100 (A) 11/08/2017 2312   UROBILINOGEN 1.0 08/04/2012 1938   NITRITE POSITIVE (A) 11/08/2017 2312   LEUKOCYTESUR LARGE (A) 11/08/2017 2312   Sepsis Labs Invalid input(s): PROCALCITONIN,  WBC,  LACTICIDVEN Microbiology Recent Results (from the past 240 hour(s))  Resp Panel by RT-PCR (Flu A&B, Covid) Nasopharyngeal Swab     Status: None   Collection Time: 04/05/21  3:41 PM   Specimen: Nasopharyngeal Swab; Nasopharyngeal(NP) swabs in vial transport medium  Result Value Ref Range Status   SARS Coronavirus 2 by RT PCR NEGATIVE NEGATIVE Final    Comment: (NOTE) SARS-CoV-2 target nucleic acids are NOT DETECTED.  The SARS-CoV-2 RNA is generally detectable in upper respiratory specimens during the acute phase of infection. The lowest concentration of SARS-CoV-2 viral copies this assay can detect is 138 copies/mL. A negative result does not preclude SARS-Cov-2 infection and should not be used as the sole basis for treatment or other patient management decisions. A negative result may occur with  improper specimen collection/handling, submission of specimen other than nasopharyngeal swab, presence of viral mutation(s) within the areas targeted by this assay, and  inadequate number of viral copies(<138 copies/mL). A negative result must be combined with clinical observations, patient history, and epidemiological information. The expected result is Negative.  Fact Sheet for Patients:  EntrepreneurPulse.com.au  Fact Sheet for Healthcare Providers:  IncredibleEmployment.be  This test is no t yet approved or cleared by the [place] FDA and  has been authorized for detection and/or diagnosis of SARS-CoV-2 by FDA under an Emergency Use Authorization (EUA). This EUA will remain  in effect (meaning this test can be used) for the duration of the COVID-19 declaration under  Section 564(b)(1) of the Act, 21 U.S.C.section 360bbb-3(b)(1), unless the authorization is terminated  or revoked sooner.       Influenza A by PCR NEGATIVE NEGATIVE Final   Influenza B by PCR NEGATIVE NEGATIVE Final    Comment: (NOTE) The Xpert Xpress SARS-CoV-2/FLU/RSV plus assay is intended as an aid in the diagnosis of influenza from Nasopharyngeal swab specimens and should not be used as a sole basis for treatment. Nasal washings and aspirates are unacceptable for Xpert Xpress SARS-CoV-2/FLU/RSV testing.  Fact Sheet for Patients: EntrepreneurPulse.com.au  Fact Sheet for Healthcare Providers: IncredibleEmployment.be  This test is not yet approved or cleared by the [place] FDA and has been authorized for detection and/or diagnosis of SARS-CoV-2 by FDA under an Emergency Use Authorization (EUA). This EUA will remain in effect (meaning this test can be used) for the duration of the COVID-19 declaration under Section 564(b)(1) of the Act, 21 U.S.C. section 360bbb-3(b)(1), unless the authorization is terminated or revoked.  Performed at [facility], [place] [address] [place], [place] [address]   Surgical pcr screen     Status: Abnormal   Collection Time: 04/07/21  9:24 AM    Specimen: Nasal Mucosa; Nasal Swab  Result Value Ref Range Status   MRSA, PCR POSITIVE (A) NEGATIVE Final    Comment: RESULT CALLED TO, READ BACK BY AND VERIFIED WITH: Clotilde Dieter, RN 04/07/21 1041 KDS    Staphylococcus aureus POSITIVE (A) NEGATIVE Final    Comment: (NOTE) The Xpert SA Assay (FDA approved for NASAL specimens in patients 62 years of age and older), is one component of a comprehensive surveillance program. It is not intended to diagnose infection nor to guide or monitor treatment. Performed at [facility], [place] [address] [place], [place] [address]   Aerobic/Anaerobic Culture w Gram Stain (surgical/deep wound)     Status: None   Collection Time: 04/07/21 12:08 PM   Specimen: PATH Bone resection; Tissue  Result Value Ref Range Status   Specimen Description   Final    SACRAL BONE Performed at [place] [address], [place], [place] [address]    Special Requests   Final    NONE Performed at [facility], [place] [address] [place], [place] [address]    Gram Stain   Final    FEW SQUAMOUS EPITHELIAL CELLS PRESENT FEW WBC SEEN FEW GRAM NEGATIVE RODS    Culture   Final    ABUNDANT PROTEUS MIRABILIS ABUNDANT ESCHERICHIA COLI NO ANAEROBES ISOLATED Performed at [facility], [address], [place],  [address]    Report Status 04/12/2021 FINAL  Final   Organism ID, Bacteria PROTEUS MIRABILIS  Final   Organism ID, Bacteria ESCHERICHIA COLI  Final      Susceptibility   Escherichia coli - MIC*    AMPICILLIN >=32 RESISTANT Resistant     CEFAZOLIN <=4 SENSITIVE Sensitive     CEFEPIME <=0.12 SENSITIVE Sensitive     CEFTAZIDIME <=1 SENSITIVE Sensitive     CEFTRIAXONE <=0.25 SENSITIVE Sensitive     CIPROFLOXACIN >=4 RESISTANT Resistant     GENTAMICIN >=16 RESISTANT Resistant     IMIPENEM <=0.25 SENSITIVE Sensitive     TRIMETH/SULFA <=20 SENSITIVE Sensitive     AMPICILLIN/SULBACTAM 16  INTERMEDIATE Intermediate     PIP/TAZO <=4 SENSITIVE Sensitive     * ABUNDANT ESCHERICHIA COLI   Proteus mirabilis - MIC*    AMPICILLIN >=32 RESISTANT Resistant     CEFAZOLIN >=64  RESISTANT Resistant     CEFEPIME <=0.12 SENSITIVE Sensitive     CEFTAZIDIME 16 INTERMEDIATE Intermediate     CEFTRIAXONE 2 INTERMEDIATE Intermediate     CIPROFLOXACIN >=4 RESISTANT Resistant     GENTAMICIN <=1 SENSITIVE Sensitive     IMIPENEM <=0.25 SENSITIVE Sensitive     TRIMETH/SULFA <=20 SENSITIVE Sensitive     AMPICILLIN/SULBACTAM >=32 RESISTANT Resistant     PIP/TAZO 64 INTERMEDIATE Intermediate     * ABUNDANT PROTEUS MIRABILIS  Resp Panel by RT-PCR (Flu A&B, Covid) Nasopharyngeal Swab     Status: None   Collection Time: 04/13/21 10:40 AM   Specimen: Nasopharyngeal Swab; Nasopharyngeal(NP) swabs in vial transport medium  Result Value Ref Range Status   SARS Coronavirus 2 by RT PCR NEGATIVE NEGATIVE Final    Comment: (NOTE) SARS-CoV-2 target nucleic acids are NOT DETECTED.  The SARS-CoV-2 RNA is generally detectable in upper respiratory specimens during the acute phase of infection. The lowest concentration of SARS-CoV-2 viral copies this assay can detect is 138 copies/mL. A negative result does not preclude SARS-Cov-2 infection and should not be used as the sole basis for treatment or other patient management decisions. A negative result may occur with  improper specimen collection/handling, submission of specimen other than nasopharyngeal swab, presence of viral mutation(s) within the areas targeted by this assay, and inadequate number of viral copies(<138 copies/mL). A negative result must be combined with clinical observations, patient history, and epidemiological information. The expected result is Negative.  Fact Sheet for Patients:  EntrepreneurPulse.com.au  Fact Sheet for Healthcare Providers:  IncredibleEmployment.be  This test is no t yet approved  or cleared by the [place] FDA and  has been authorized for detection and/or diagnosis of SARS-CoV-2 by FDA under an Emergency Use Authorization (EUA). This EUA will remain  in effect (meaning this test can be used) for the duration of the COVID-19 declaration under Section 564(b)(1) of the Act, 21 U.S.C.section 360bbb-3(b)(1), unless the authorization is terminated  or revoked sooner.       Influenza A by PCR NEGATIVE NEGATIVE Final   Influenza B by PCR NEGATIVE NEGATIVE Final    Comment: (NOTE) The Xpert Xpress SARS-CoV-2/FLU/RSV plus assay is intended as an aid in the diagnosis of influenza from Nasopharyngeal swab specimens and should not be used as a sole basis for treatment. Nasal washings and aspirates are unacceptable for Xpert Xpress SARS-CoV-2/FLU/RSV testing.  Fact Sheet for Patients: EntrepreneurPulse.com.au  Fact Sheet for Healthcare Providers: IncredibleEmployment.be  This test is not yet approved or cleared by the [place] FDA and has been authorized for detection and/or diagnosis of SARS-CoV-2 by FDA under an Emergency Use Authorization (EUA). This EUA will remain in effect (meaning this test can be used) for the duration of the COVID-19 declaration under Section 564(b)(1) of the Act, 21 U.S.C. section 360bbb-3(b)(1), unless the authorization is terminated or revoked.  Performed at [facility], [place] [address], [place], [place] [address]      Time coordinating discharge: 61mns  SIGNED:   JKathie Dike MD  [facility] 04/13/2021, 1:34 PM   If 7PM-7AM, please contact night-coverage [email or web address]

## 2021-04-13 NOTE — Progress Notes (Signed)
Called report to [employer] ,Therapist, sports at [facility].

## 2021-04-14 NOTE — Progress Notes (Signed)
Patient was discharged to [facility] facility at [phone]. No concerns noted

## 2021-04-16 ENCOUNTER — Non-Acute Institutional Stay (SKILLED_NURSING_FACILITY): Payer: Medicare Other | Admitting: Internal Medicine

## 2021-04-16 ENCOUNTER — Encounter: Payer: Self-pay | Admitting: Internal Medicine

## 2021-04-16 DIAGNOSIS — Z794 Long term (current) use of insulin: Secondary | ICD-10-CM

## 2021-04-16 DIAGNOSIS — E1140 Type 2 diabetes mellitus with diabetic neuropathy, unspecified: Secondary | ICD-10-CM | POA: Diagnosis not present

## 2021-04-16 DIAGNOSIS — M4628 Osteomyelitis of vertebra, sacral and sacrococcygeal region: Secondary | ICD-10-CM | POA: Diagnosis not present

## 2021-04-16 DIAGNOSIS — E440 Moderate protein-calorie malnutrition: Secondary | ICD-10-CM

## 2021-04-16 DIAGNOSIS — R2231 Localized swelling, mass and lump, right upper limb: Secondary | ICD-10-CM

## 2021-04-16 DIAGNOSIS — R000 Tachycardia, unspecified: Secondary | ICD-10-CM | POA: Diagnosis not present

## 2021-04-16 NOTE — Patient Instructions (Signed)
See assessment and plan under each diagnosis in the problem list and acutely for this visit 

## 2021-04-16 NOTE — Progress Notes (Signed)
NURSING HOME LOCATION:  [facility] ROOM NUMBER:  [id number]  CODE STATUS:  DNR  PCP:  Martie Lee [facility] NP   This is a nursing facility follow up visit for [facility] readmission within 30 days.    Interim medical record and care since last SNF visit was updated with review of diagnostic studies and change in clinical status since last visit were documented.  HPI: Patient was hospitalized 10/6 - 04/13/2021 presenting from the ID clinic for surgical consultation for progressive decubitus ulcer.  A chronic stage IV sacral decubitus ulcer was documented on MRI of the sacrum revealing erosion of the sacrum adjacent to a large ulcer as well as 3 cm abscess.  The infection was associated with profound leukocytosis of > 20,000.  Surgical debridement was performed and wound VAC applied along with a diverting colostomy.  Based on culture results revealing Proteus and E. coli , she was to be treated with 6 weeks of ertapenem as per ID. Despite surgical intervention, chronic anemia was stable.  White count remained elevated but was stable and she was afebrile. During the hospitalization Trulicity was held and Lantus was continued.  Review of systems: She understands completely what transpired in the hospital and the need for the diverting colostomy.  She denies any significant abdominal pain at this time.  She has noted some swelling in the right antecubital area for the last several weeks.  She did not mention this to the hospitalist.  It does not hurt.  She states that she is "dealing with my depression".  Constitutional: No fever, significant weight change  Eyes: No redness, discharge, pain, vision change ENT/mouth: No nasal congestion,  purulent discharge, earache, change in hearing, sore throat  Cardiovascular: No chest pain, palpitations, paroxysmal nocturnal dyspnea, edema  Respiratory: No cough, sputum production, hemoptysis Gastrointestinal: No heartburn,  dysphagia, abdominal pain, nausea /vomiting, rectal bleeding, melena, change in bowels Genitourinary: No hematuria, pyuria Musculoskeletal: No joint stiffness, joint swelling, weakness, pain Dermatologic: No rash, pruritus, change in appearance of skin Neurologic: No dizziness, headache, syncope, seizures Endocrine: No change in hair/skin/nails, excessive thirst, excessive hunger, excessive urination  Hematologic/lymphatic: No significant bruising, lymphadenopathy, abnormal bleeding   Physical exam:  Pertinent or positive findings: Affect is markedly flat.  Arcus senilis present.  There is hirsutism over the chin.  She exhibits a resting tachycardia.  Breath sounds are decreased.  Ostomy bag and suprapubic catheter present.  Pedal pulses are nonpalpable.  There is a subcutaneous irregular doughy structure in the right antecubital area which is not tender to palpation.  Both upper extremities are weak but the left more so.  There is no range of motion of the lower extremities which are in booties.  General appearance: Adequately nourished; no acute distress, increased work of breathing is present.   Lymphatic: No lymphadenopathy about the head, neck, axilla. Eyes: No conjunctival inflammation or lid edema is present. There is no scleral icterus. Ears:  External ear exam shows no significant lesions or deformities.   Nose:  External nasal examination shows no deformity or inflammation. Nasal mucosa are pink and moist without lesions, exudates Oral exam:  Lips and gums are healthy appearing. There is no oropharyngeal erythema or exudate. Neck:  No thyromegaly, masses, tenderness noted.    Heart:  No murmur, click, rub .  Lungs:  without wheezes, rhonchi, rales, rubs. Abdomen: Bowel sounds are normal. Abdomen is soft and nontender with no organomegaly, hernias, masses. GU: Deferred  Extremities:  No cyanosis, clubbing, edema  Skin: Warm & dry w/o tenting. No significant  rash.  See summary  under each active problem in the Problem List with associated updated therapeutic plan

## 2021-04-16 NOTE — Assessment & Plan Note (Signed)
ID follow-up 05/10/2021

## 2021-04-16 NOTE — Assessment & Plan Note (Addendum)
Resting pulse 105-115.  This is in the context of anemia and intermittent hypokalemia.  Thyroid function tests last performed 03/07/21. Update of free T3, free T4 & TSH  indicated because of her history of hyperthyroidism being actively treated w Methimazole.

## 2021-04-16 NOTE — Assessment & Plan Note (Addendum)
She had 2 unopened protein supplement packets on her bedside table.  I stressed to her the importance of total protein and albumin in wound healing.

## 2021-04-16 NOTE — Assessment & Plan Note (Signed)
DM with neuro ,renal , vascular complications Glucose range as IP : 80-194 Current A1c:5.9% A1c goal : < 8% No hypoglycemia No change indicated

## 2021-05-10 ENCOUNTER — Ambulatory Visit: Payer: Medicare Other | Admitting: Infectious Disease

## 2021-05-15 ENCOUNTER — Non-Acute Institutional Stay: Payer: Medicare Other | Admitting: Hospice

## 2021-05-15 ENCOUNTER — Other Ambulatory Visit: Payer: Self-pay

## 2021-05-15 DIAGNOSIS — G35 Multiple sclerosis: Secondary | ICD-10-CM

## 2021-05-15 DIAGNOSIS — N3289 Other specified disorders of bladder: Secondary | ICD-10-CM

## 2021-05-15 DIAGNOSIS — M4628 Osteomyelitis of vertebra, sacral and sacrococcygeal region: Secondary | ICD-10-CM

## 2021-05-15 DIAGNOSIS — Z515 Encounter for palliative care: Secondary | ICD-10-CM

## 2021-05-15 NOTE — Progress Notes (Signed)
[facility] Consult Note Telephone: [phone]  Fax: [phone]  PATIENT NAME: Laura Roberts DOB: 19-Jan-1941 MRN: [id number]  PRIMARY CARE PROVIDER:   [name], MD [name], [address] [place] [place],  [place] [address]  REFERRING PROVIDER: [name], MD [name], [place],   [address]  RESPONSIBLE PARTY:   Contact Information     Name Relation Home Work Mobile   [place] III Son [phone]     Rodney, [place] [phone]  [phone]       Visit is to build trust and highlight Palliative Medicine as specialized medical care for people living with serious illness, aimed at facilitating better quality of life through symptoms relief, assisting with advance care planning and complex medical decision making. This is a follow up visit.  RECOMMENDATIONS/PLAN:   Code Status: Patient affirmed she is a DNR. Signed DNR in facility chart and in [facility].   Goals of Care: Goals of care include to maximize quality of life and symptom management. MOST form selections  include DO NOT RESUSCITATE, limited additional interventions, IV fluids as indicated, antibiotics as indicated, no feeding tube    Visit consisted of counseling and education dealing with the complex and emotionally intense issues of symptom management and palliative care in the setting of serious and potentially life-threatening illness. Palliative care team will continue to support patient, patient's family, and medical team.  Symptom management/Plan:  Sacral Osteomyelitis: Continuing with Ertapenem for 6 weeks as ordered by ID. Wound VAC in place. Repositioning encouraged.  Routine CBC ESR CRP CMP to monitor. FU with ID as planned.  Multiple sclerosis: Managed with Tecfidera, Neurontin, baclofen. Continue with restorative exercises and ongoing occupational therapy.  Neurogenic bladder: Suprapubic  cath in place.  Followed by urologist.  Gets Botox injections.   Bladder spasm: Patient reports bladder spasms under control with the Botox injections and baclofen and Solifenacin Follow up: Palliative care will continue to follow for complex medical decision making, advance care planning, and clarification of goals. Return 6 weeks or prn. Encouraged to call provider sooner with any concerns.  CHIEF COMPLAINT: Palliative follow up  HISTORY OF PRESENT ILLNESS:  Laura Roberts a 80 y.o. female with multiple medical problems including sacral osteomyelitis , culminating from progressive sacral decubitus ulcer. It was infected, impairing her quality of life; required surgical debridement and wound VAC applied plus a diverting colostomy. She reports feeling better since the procedure and Ertapenem started by ID. She denied pain, no fever/chills. History of multiple sclerosis, neurogenic bladder, bladder spas,  paraplegia, type 2 diabetes mellitus, depression, hypertension.  Not able to ambulate due to MS progression, bedbound, Hoyer lift for transfers. History obtained from review of EMR, discussion with primary team, family and/or patient. Records reviewed and summarized above. All 10 point systems reviewed and are negative except as documented in history of present illness above  Review and summarization of Epic records shows history from other than patient.   Palliative Care was asked to follow this patient o help address complex decision making in the context of advance care planning and goals of care clarification. I reviewed, as needed, available labs, patient records, imaging, studies and related documents from the EMR.    PHYSICAL EXAM  General: In no acute distress, cooperative Cardiovascular: regular rate and rhythm, no edema in BLE Pulmonary: no cough, no increased work of breathing, normal respiratory effort Abdomen: soft, non tender, positive bowel sounds  in all quadrants, colostomy to  LUQ GU:  no suprapubic tenderness; suprapubic Size 18 French in place, patient with light yellow urine Eyes: Normal lids, no discharge, sclera anicteric ENMT: Moist mucous membranes Musculoskeletal: bed bound, hoyer lift for transfers, wound vac applied to sacral wound, PICC to RUA Skin: no rash to visible skin, warm without cyanosis Psych: flat affect Neurological: Weakness but otherwise non focal Heme/lymph/immuno: no bruises, no bleeding  PERTINENT MEDICATIONS:  Outpatient Encounter Medications as of 05/15/2021  Medication Sig   acetaminophen (TYLENOL) 325 MG tablet Take 650 mg by mouth every 6 (six) hours as needed for mild pain or fever.    Amino Acids-Protein Hydrolys (FEEDING SUPPLEMENT, PRO-STAT SUGAR FREE 64,) LIQD Take 30 mLs by mouth 2 (two) times daily.   baclofen (LIORESAL) 10 MG tablet Take 10-15 mg by mouth daily. (0800 & 1200) Take 1.5 tablets (15 mg) by mouth in the morning & take 1 tablet (10 mg) by mouth at noon.   baclofen (LIORESAL) 20 MG tablet Take 20 mg by mouth at bedtime. (2000)   barrier cream (NON-SPECIFIED) CREA Apply 1 application topically 2 (two) times daily. (0700 & 1900) Apply to right ischium and bilateral buttocks   calcium citrate (CALCITRATE - DOSED IN MG ELEMENTAL CALCIUM) 950 (200 Ca) MG tablet Take 200 mg of elemental calcium by mouth daily.   carboxymethylcellul-glycerin (OPTIVE) 0.5-0.9 % ophthalmic solution Place 1 drop into both eyes 4 (four) times daily as needed for dry eyes. (0800,1200, 1600 & 2000)   cholecalciferol (VITAMIN D) 25 MCG (1000 UNIT) tablet Take 1,000 Units by mouth daily.   cycloSPORINE (RESTASIS) 0.05 % ophthalmic emulsion Place 1 drop into both eyes 2 (two) times daily. (0800 & 2000)   Dulaglutide (TRULICITY) 3 RC/7.8LF SOPN Inject 3 mg into the skin every Friday. (0800)   DULoxetine (CYMBALTA) 30 MG capsule Take 3 capsules (90 mg total) by mouth 2 (two) times daily. (0800)Give along with 30 mg to = 90 mg   ertapenem (INVANZ)  IVPB Inject 1 g into the vein daily. Indication:  Sacral osteomyelitis  First Dose: No Last Day of Therapy:  05/23/21 Labs - Once weekly:  CBC/D and BMP, Labs - Every other week:  ESR and CRP Method of administration: Mini-Bag Plus / Gravity Method of administration may be changed at the discretion of home infusion pharmacist based upon assessment of the patient and/or caregiver's ability to self-administer the medication ordered.   feeding supplement, GLUCERNA SHAKE, (GLUCERNA SHAKE) LIQD Take 237 mLs by mouth 2 (two) times daily between meals.   ferrous sulfate 325 (65 FE) MG tablet Take 325 mg by mouth 2 (two) times daily with a meal.   furosemide (LASIX) 20 MG tablet Take 1 tablet (20 mg total) by mouth every other day.   gabapentin (NEURONTIN) 100 MG capsule Take 200 mg by mouth 3 (three) times daily.   guaiFENesin (MUCINEX) 600 MG 12 hr tablet Take 1,200 mg by mouth at bedtime. (2000)   insulin glargine (LANTUS) 100 UNIT/ML Solostar Pen Inject 18 Units into the skin at bedtime. (2000)   loperamide (IMODIUM) 2 MG capsule Take 4 mg by mouth daily as needed for diarrhea or loose stools (SUBSEQUENT DOSE GIVE ONE TAB AFTER EACH STOOL (max 8 mg/24 hrs.)).   magnesium oxide (MAG-OX) 400 MG tablet Take 400 mg by mouth 2 (two) times daily.   methimazole (TAPAZOLE) 5 MG tablet Take 1 tablet (5 mg total) by mouth daily. (Patient taking differently: Take 5 mg by mouth  daily at 6 (six) AM. (0600))   metoprolol succinate (TOPROL-XL) 50 MG 24 hr tablet Take 50 mg by mouth daily.   Multiple Vitamin (MULTIVITAMIN WITH MINERALS) TABS tablet Take 1 tablet by mouth daily.   Nutritional Supplements (,FEEDING SUPPLEMENT, PROSOURCE PLUS) liquid Take 30 mLs by mouth 3 (three) times daily between meals.   ondansetron (ZOFRAN) 4 MG tablet Take 4 mg by mouth every 6 (six) hours as needed for nausea or vomiting.   oxyCODONE (OXY IR/ROXICODONE) 5 MG immediate release tablet Take 1 tablet (5 mg total) by mouth every 6  (six) hours as needed for moderate pain.   pantoprazole (PROTONIX) 40 MG tablet Take 40 mg by mouth daily at 6 (six) AM. (0600)   polyethylene glycol (MIRALAX / GLYCOLAX) 17 g packet Take 17 g by mouth daily as needed.   potassium chloride SA (KLOR-CON) 20 MEQ tablet Take 20 mEq by mouth 2 (two) times daily.   predniSONE (DELTASONE) 2.5 MG tablet Take 2.5 mg by mouth every other day. (0800) Alternating days with 5 mg dose   predniSONE (DELTASONE) 5 MG tablet Take 5 mg by mouth every other day. (0800) Alternating days with 2.5 mg dose   saccharomyces boulardii (FLORASTOR) 250 MG capsule Take 250 mg by mouth 2 (two) times daily.   SF 5000 PLUS 1.1 % CREA dental cream Take 1 application by mouth daily at 8 pm. (2000)   solifenacin (VESICARE) 10 MG tablet Take 10 mg by mouth daily. (0800)   TECFIDERA 240 MG CPDR Take 1 capsule (240 mg total) by mouth 2 (two) times daily. (0800 & 2000)   vitamin C (ASCORBIC ACID) 500 MG tablet Take 500 mg by mouth in the morning and at bedtime.   zinc sulfate 220 (50 Zn) MG capsule Take 220 mg by mouth daily.   No facility-administered encounter medications on file as of 05/15/2021.    HOSPICE ELIGIBILITY/DIAGNOSIS: TBD  PAST MEDICAL HISTORY:  Past Medical History:  Diagnosis Date   Age related osteoporosis    Aphasia    Bronchitis    hx of    Cellulitis and abscess of toe    hx of    Closed supracondylar fracture of right femur, periprosthetic 06/01/2014   Constipation    Cystostomy in place [facility])    Decubitus ulcer    Degenerative arthritis    osteoarthritis    Depression    Diabetes mellitus without complication (HCC)    type 2    Edema    localized    Full incontinence of feces    GERD (gastroesophageal reflux disease)    Gout    hx of    History of falling    Hyperlipidemia    Hypertension    Hyperthyroidism    Multiple sclerosis (HCC)    Neurogenic bowel    Neuromuscular disorder (HCC)    Bilateral hand carpal tunnel syndrome    Neuromuscular dysfunction of bladder    Nontoxic multinodular goiter    Obesity    Osteomyelitis of coccyx ([place]) 04/05/2021   Osteoporosis    Other adrenocortical insufficiency (HCC)    Paraplegia (HCC)    LEGS   Personal history of urinary (tract) infections    Polyneuropathy    Pressure ulcer of right buttock, stage 4 (HCC)    Sacral osteomyelitis ([place]) 04/05/2021   Spinal stenosis in cervical region    Spinal stenosis of lumbosacral region    Spinal stenosis, thoracic    Tinea unguium  Vitamin D deficiency       ALLERGIES:  Allergies  Allergen Reactions   Codeine Other (See Comments)    Difficult breathing and skin problem   Ultram [Tramadol] Other (See Comments)    Difficult breathing and skin peeling   Januvia [Sitagliptin] Rash and Other (See Comments)    Blisters      Thank you for the opportunity to participate in the care of NICHELLE RENWICK Please call our office at [phone] if we can be of additional assistance.  Note: Portions of this note were generated with [employer]. Dictation errors may occur despite best attempts at proofreading.  Teodoro Spray, NP

## 2021-05-31 ENCOUNTER — Telehealth: Payer: Self-pay

## 2021-05-31 ENCOUNTER — Non-Acute Institutional Stay (SKILLED_NURSING_FACILITY): Payer: Medicare Other | Admitting: Internal Medicine

## 2021-05-31 ENCOUNTER — Encounter: Payer: Self-pay | Admitting: Internal Medicine

## 2021-05-31 DIAGNOSIS — M4628 Osteomyelitis of vertebra, sacral and sacrococcygeal region: Secondary | ICD-10-CM

## 2021-05-31 DIAGNOSIS — D72829 Elevated white blood cell count, unspecified: Secondary | ICD-10-CM

## 2021-05-31 NOTE — Telephone Encounter (Addendum)
Received call today from Laura Roberts with [facility] in regards to some concerning labs. States that WBC and GFR are elevated. Nurse practioner at [facility] would like to know if patient should be seen in office.  Attempted to call Laura Roberts back for more information and to have labs faxed to office. Phone number that was left in voicemail belonged to patient. Left message requesting call back.  Laura Roberts, RMA

## 2021-05-31 NOTE — Telephone Encounter (Signed)
Labs received from Tony.   11/23 WBC: 11.6 EGFR:>90  11/30  WBC: 15.1 EGFR: >90

## 2021-05-31 NOTE — Assessment & Plan Note (Addendum)
05/23/2021 six week course of Ivanz completed and PICC line discontinued. 05/30/2021 white count 15,100 and C-reactive protein 6.94.  Wound care nurse at SNF reports wound improved. ID appt 06/08/21 with Dr Tommy Medal

## 2021-05-31 NOTE — Telephone Encounter (Signed)
Attempted to reach [place] with [facility], but had to leave a voicemail. Will need to schedule 15 min follow up appt with Dr. Tommy Medal. Leatrice Jewels, RMA

## 2021-05-31 NOTE — Patient Instructions (Signed)
See assessment and plan under each diagnosis in the problem list and acutely for this visit 

## 2021-05-31 NOTE — Assessment & Plan Note (Signed)
05/30/21  WBC 15,100 & CRP 6.94 S/P 6 weeks of Ivanz via PICC line. ID specialist consulted.

## 2021-05-31 NOTE — Progress Notes (Signed)
NURSING HOME LOCATION:  [facility] ROOM NUMBER:  [id number]  CODE STATUS:  DNR  PCP:  Martie Lee [facility] NP   This is a nursing facility follow up visit for specific acute issue of abnormal labs. Interim medical record and care since last SNF visit was updated with review of diagnostic studies and change in clinical status since last visit were documented.  HPI: As of 05/23/2021 the patient had completed 6 weeks of Ivanz for sacral osteomyelitis via PICC line which was pulled. Labs have been monitored during the Ivanz therapy. The most recent labs were 11/30 when white count was 15,100 and C-reactive protein 6.94.On 05/23/2021 white count was 11,600 and C-reactive protein 3.94.  The white count had peaked at 15,400 on 10/14 and has ranged from 11.4 up to 13.3. She has exhibited a normochromic, normocytic anemia with H/H varying from 9/27.9 up to 10.4/32.6. During the same period; renal function has been normal with creatinines of 0.34 up to 0.57; GFR has been greater than 90 at all times. She is asymptomatic but this is in the context of peripheral neuropathy from [place].  She is remained afebrile.  Wound care nurse at [facility], Edwena Felty, reports that the wound has improved.  Review of systems: She denies any new sacral area pain but again this is in the context of MS with neuropathy.  She denies any infectious or inflammatory symptoms.  Constitutional: No fever, significant weight change, fatigue  Eyes: No redness, discharge, pain, vision change ENT/mouth: No nasal congestion,  purulent discharge, earache, change in hearing, sore throat  Cardiovascular: No chest pain, palpitations, paroxysmal nocturnal dyspnea, claudication, edema  Respiratory: No cough, sputum production, hemoptysis, DOE, significant snoring, apnea   Gastrointestinal: No heartburn, dysphagia, abdominal pain, nausea /vomiting Genitourinary: No hematuria, pyuria Dermatologic: No rash, pruritus,  change in appearance of skin Neurologic: No dizziness, headache, syncope, seizures, numbness, tingling Psychiatric: No significant anxiety, depression, insomnia, anorexia Endocrine: No change in hair/skin/nails, excessive thirst, excessive hunger, excessive urination  Hematologic/lymphatic: No significant bruising, lymphadenopathy, abnormal bleeding  Physical exam:  Pertinent or positive findings: She appears much younger than her stated age as there is no facial wrinkling.  She is not wearing her upper partial.  Slight tachycardia is noted.  Breath sounds are decreased.  Ostomy bag is present on the left.  Suprapubic catheter is present as well.  Wound VAC is sitting on the bed; apparently it became detached last night.  Wound care nurse is to return to reinsert it.  Pedal pulses are decreased.  Posterior tibial pulses are slightly stronger than dorsalis pedis pulses.  She has interosseous wasting especially in the left hand.  Both upper extremities are weak, left greater than the right clinically.  She has no range of motion of the lower extremities.  General appearance: Adequately nourished; no acute distress, increased work of breathing is present.   Lymphatic: No lymphadenopathy about the head, neck, axilla. Eyes: No conjunctival inflammation or lid edema is present. There is no scleral icterus. Ears:  External ear exam shows no significant lesions or deformities.   Nose:  External nasal examination shows no deformity or inflammation. Nasal mucosa are pink and moist without lesions, exudates Neck:  No thyromegaly, masses, tenderness noted.    Heart:  No gallop, murmur, click, rub .  Lungs:  without wheezes, rhonchi, rales, rubs. Abdomen: Bowel sounds are normal. Abdomen is soft and nontender with no organomegaly, hernias, masses. GU: Deferred  Extremities:  No cyanosis,  clubbing, edema  Skin: Warm & dry w/o tenting. No significant rash visible.  See summary under each active problem in  the Problem List with associated updated therapeutic plan

## 2021-05-31 NOTE — Telephone Encounter (Signed)
Called [facility] regarding voicemail, spoke with Di Kindle who states that wound has improved. Only concerns is labs. Will have labs faxed to triage. Confirmed with [facility] that picc was pulled after last dose of invanz.  P: [phone]

## 2021-06-08 ENCOUNTER — Other Ambulatory Visit: Payer: Self-pay

## 2021-06-08 ENCOUNTER — Encounter: Payer: Self-pay | Admitting: Infectious Disease

## 2021-06-08 ENCOUNTER — Ambulatory Visit (INDEPENDENT_AMBULATORY_CARE_PROVIDER_SITE_OTHER): Payer: Medicare Other | Admitting: Infectious Disease

## 2021-06-08 VITALS — BP 103/69 | HR 97 | Temp 98.4°F

## 2021-06-08 DIAGNOSIS — L89154 Pressure ulcer of sacral region, stage 4: Secondary | ICD-10-CM

## 2021-06-08 DIAGNOSIS — G8220 Paraplegia, unspecified: Secondary | ICD-10-CM | POA: Diagnosis not present

## 2021-06-08 DIAGNOSIS — M8660 Other chronic osteomyelitis, unspecified site: Secondary | ICD-10-CM

## 2021-06-08 DIAGNOSIS — R7989 Other specified abnormal findings of blood chemistry: Secondary | ICD-10-CM

## 2021-06-08 DIAGNOSIS — G35 Multiple sclerosis: Secondary | ICD-10-CM

## 2021-06-08 DIAGNOSIS — Z794 Long term (current) use of insulin: Secondary | ICD-10-CM

## 2021-06-08 DIAGNOSIS — E1140 Type 2 diabetes mellitus with diabetic neuropathy, unspecified: Secondary | ICD-10-CM | POA: Diagnosis not present

## 2021-06-08 DIAGNOSIS — D72829 Elevated white blood cell count, unspecified: Secondary | ICD-10-CM

## 2021-06-08 DIAGNOSIS — R000 Tachycardia, unspecified: Secondary | ICD-10-CM

## 2021-06-08 NOTE — Progress Notes (Signed)
Subjective:  Chief complaint: She was referred to our clinic due to anxiety at the nursing facility where she resides about her white blood cell count   Patient ID: Laura Roberts, female    DOB: 06/16/1941, 80 y.o.   MRN: [id number]  HPI  Laura Roberts is a very nice 80 year old black woman living with multiple sclerosis and paraplegia with chronic prednisone and chronically elevated white blood cell count who developed a deep stage IV decubitus ulcer with sacral osteomyelitis.  She underwent debridement on April 07, 2021 with bone cultures identifying Proteus mirabilis and E. coli.  She was seen by [place] on inpatient and prescribed 6 weeks of ertapenem which she has completed.  She continues to have wound VAC in place and is followed by wound care at the facility that she resides in.  There was some anxiety there when they were checking labs about the fact that her white blood cell count is elevated.  She herself is quite aware of the fact that she has an elevated white blood cell count at baseline she says in part due to her prednisone and also one of the other medications that she takes for her multiple sclerosis.  Indeed examining her white blood cell count 1 can see that she has rarely had a normal white blood cell count at least in the labs we have available to [place] in epic.  At her skilled nursing facility the anxiety particular was related from her white blood cell count going up from 11,600-15.1 from November 23 to November 30.  Apparently was also concerned about the fact that her creatinine was low at 0.34.  Her sedimentation rate however and C-reactive protein have gone down.  She does not have worsening drainage from the wound and has no systemic symptoms of infection.     Past Medical History:  Diagnosis Date   Age related osteoporosis    Aphasia    Bronchitis    hx of    Cellulitis and abscess of toe    hx of    Closed supracondylar fracture of right femur, periprosthetic  06/01/2014   Constipation    Cystostomy in place [facility])    Decubitus ulcer    Degenerative arthritis    osteoarthritis    Depression    Diabetes mellitus without complication (HCC)    type 2    Edema    localized    Full incontinence of feces    GERD (gastroesophageal reflux disease)    Gout    hx of    History of falling    Hyperlipidemia    Hypertension    Hyperthyroidism    Multiple sclerosis (HCC)    Neurogenic bowel    Neuromuscular disorder (HCC)    Bilateral hand carpal tunnel syndrome   Neuromuscular dysfunction of bladder    Nontoxic multinodular goiter    Obesity    Osteomyelitis of coccyx ([place]) 04/05/2021   Osteoporosis    Other adrenocortical insufficiency (HCC)    Paraplegia (HCC)    LEGS   Personal history of urinary (tract) infections    Polyneuropathy    Pressure ulcer of right buttock, stage 4 (HCC)    Sacral osteomyelitis ([place]) 04/05/2021   Spinal stenosis in cervical region    Spinal stenosis of lumbosacral region    Spinal stenosis, thoracic    Tinea unguium    Vitamin D deficiency     Past Surgical History:  Procedure Laterality Date   ABDOMINAL HYSTERECTOMY  APPLICATION OF WOUND VAC  04/07/2021   Procedure: APPLICATION OF WOUND VAC;  Surgeon: Felicie Morn, MD;  Location: [facility];  Service: General;;   BACK SURGERY     BOTOX INJECTION N/A 01/30/2018   Procedure: CYSTOSCOPY BOTOX INJECTION, SUPRAPUBIC EXCHANGE;  Surgeon: Lucas Mallow, MD;  Location: [facility];  Service: Urology;  Laterality: N/A;   BOTOX INJECTION N/A 09/02/2018   Procedure: BOTOX INJECTION;  Surgeon: Lucas Mallow, MD;  Location: [facility];  Service: Urology;  Laterality: N/A;   BOTOX INJECTION N/A 05/10/2019   Procedure: CYSTOSCOPY BOTOX INJECTION, SUPRAPUBIC EXCHANGE;  Surgeon: Lucas Mallow, MD;  Location: [facility];  Service: Urology;  Laterality: N/A;   BOTOX INJECTION N/A 09/06/2019   Procedure: CYSTOSCOPY BOTOX INJECTION;  Surgeon: Lucas Mallow, MD;   Location: [facility];  Service: Urology;  Laterality: N/A;   BOTOX INJECTION N/A 04/10/2020   Procedure: CYSTOSCOPY BOTOX INJECTION;  Surgeon: Festus Aloe, MD;  Location: [facility];  Service: Urology;  Laterality: N/A;   BOTOX INJECTION N/A 09/13/2020   Procedure: CYSTOSCOPYBOTOX INJECTION SUPRAPUBIC TUBE UPSIZE AND EXCHANGE;  Surgeon: Lucas Mallow, MD;  [facility]: [facility];  Service: Urology;  Laterality: N/A;   BOTOX INJECTION N/A 03/07/2021   Procedure: CYSTOSCOPY BOTOX INJECTION AND SUPRAPUBIC TUBE EXCHANGE;  Surgeon: Lucas Mallow, MD;  Location: [facility];  Service: Urology;  Laterality: N/A;  REQUESTING 71 MINS FOR CASE   CATARACT EXTRACTION Right    CHOLECYSTECTOMY     CYSTOSCOPY N/A 09/02/2018   Procedure: CYSTOSCOPY;  Surgeon: Lucas Mallow, MD;  Location: [facility];  Service: Urology;  Laterality: N/A;   CYSTOSTOMY N/A 09/02/2018   Procedure: CYSTOSTOMY SUPRAPUBIC;  Surgeon: Lucas Mallow, MD;  Location: [facility];  Service: Urology;  Laterality: N/A;  45 MINS   FEMUR IM NAIL Right 06/02/2014   Procedure: INTRAMEDULLARY (IM) RETROGRADE FEMORAL NAILING;  Surgeon: Johnny Bridge, MD;  Location: [facility];  Service: Orthopedics;  Laterality: Right;   GROIN DISSECTION Left 10/24/2017   Procedure: IRRIGATION AND DEBRIDEMENT, LEFT THIGH ABCESS ;  Surgeon: Alphonsa Overall, MD;  Location: [facility];  Service: General;  Laterality: Left;   INCISION AND DRAINAGE PERIRECTAL ABSCESS N/A 04/07/2021   Procedure: DEBRIDEMENT OF SACRAL DECUBITUS ULCER;  Surgeon: Felicie Morn, MD;  [facility];  Service: General;  Laterality: N/A;   INSERTION OF SUPRAPUBIC CATHETER N/A 09/06/2019   Procedure: SUPRAPUBIC TUBE CHANGE;  Surgeon: Lucas Mallow, MD;  [facility];  Service: Urology;  Laterality: N/A;   INSERTION OF SUPRAPUBIC CATHETER N/A 04/10/2020   Procedure: SUPRAPUBIC CATHETER EXCHANGE/TRACT  DILATION;  Surgeon: Festus Aloe, MD;  [facility];  Service: Urology;  Laterality: N/A;    IR CATHETER TUBE CHANGE  10/13/2017   IR CATHETER TUBE CHANGE  07/15/2018   IR CATHETER TUBE CHANGE  08/26/2019   IR CATHETER TUBE CHANGE  11/25/2019   IR CATHETER TUBE CHANGE  01/06/2020   IR CATHETER TUBE CHANGE  06/13/2020   IR CATHETER TUBE CHANGE  08/01/2020   LAPAROSCOPY N/A 04/07/2021   Procedure: LAPAROSCOPY DIAGNOSTIC; LAPAROSCOPIC CREATION OF TRANSVERSE COLOSTOMY;  Surgeon: Felicie Morn, MD;  Location: [facility];  Service: General;  Laterality: N/A;   left hand carpal tunnel surgery     REPLACEMENT TOTAL KNEE BILATERAL  2004    Family History  Problem Relation Age of Onset   Multiple sclerosis Other  neices.    Cancer Mother    Diabetes Mother    GI Bleed Sister        diverticulitis   Thyroid disease Neg Hx       Social History   Socioeconomic History   Marital status: Widowed    Spouse name: Not on file   Number of children: 2   Years of education: 27yr colleg   Highest education level: Not on file  Occupational History   Occupation: N/A    Employer: [employer]    Comment: [employer]  Tobacco Use   Smoking status: Never   Smokeless tobacco: Never  Vaping Use   Vaping Use: Never used  Substance and Sexual Activity   Alcohol use: No   Drug use: No   Sexual activity: Never    Birth control/protection: Surgical  Other Topics Concern   Not on file  Social History Narrative   Patient is widowed with 2 children   Patient is right handed   Patient has 2 yrs of college   Patient drinks 2-3 cups of tea daily   Social Determinants of Health   Financial Resource Strain: Not on file  Food Insecurity: Not on file  Transportation Needs: Not on file  Physical Activity: Not on file  Stress: Not on file  Social Connections: Not on file    Allergies  Allergen Reactions   Codeine Other (See Comments)    Difficult breathing and skin problem   Ultram [Tramadol] Other (See Comments)    Difficult breathing and skin peeling   Januvia  [Sitagliptin] Rash and Other (See Comments)    Blisters     Current Outpatient Medications:    DULoxetine (CYMBALTA) 30 MG capsule, Take 3 capsules (90 mg total) by mouth 2 (two) times daily. (0800)Give along with 30 mg to = 90 mg, Disp: , Rfl:    acetaminophen (TYLENOL) 325 MG tablet, Take 650 mg by mouth every 6 (six) hours as needed for mild pain or fever. , Disp: , Rfl:    Amino Acids-Protein Hydrolys (FEEDING SUPPLEMENT, PRO-STAT SUGAR FREE 64,) LIQD, Take 30 mLs by mouth 2 (two) times daily., Disp: , Rfl:    baclofen (LIORESAL) 10 MG tablet, Take 10-15 mg by mouth daily. (0800 & 1200) Take 1.5 tablets (15 mg) by mouth in the morning & take 1 tablet (10 mg) by mouth at noon., Disp: , Rfl:    baclofen (LIORESAL) 20 MG tablet, Take 20 mg by mouth at bedtime. (2000), Disp: , Rfl:    barrier cream (NON-SPECIFIED) CREA, Apply 1 application topically 2 (two) times daily. (0700 & 1900) Apply to right ischium and bilateral buttocks, Disp: , Rfl:    calcium citrate (CALCITRATE - DOSED IN MG ELEMENTAL CALCIUM) 950 (200 Ca) MG tablet, Take 200 mg of elemental calcium by mouth daily., Disp: , Rfl:    carboxymethylcellul-glycerin (OPTIVE) 0.5-0.9 % ophthalmic solution, Place 1 drop into both eyes 4 (four) times daily as needed for dry eyes. (0800,1200, 1600 & 2000), Disp: , Rfl:    cholecalciferol (VITAMIN D) 25 MCG (1000 UNIT) tablet, Take 1,000 Units by mouth daily., Disp: , Rfl:    cycloSPORINE (RESTASIS) 0.05 % ophthalmic emulsion, Place 1 drop into both eyes 2 (two) times daily. (0800 & 2000), Disp: , Rfl:    Dulaglutide (TRULICITY) 3 YD/7.4JO SOPN, Inject 3 mg into the skin every Friday. (0800), Disp: , Rfl:    ertapenem (INVANZ) IVPB, Inject 1 g into the vein daily.  Indication:  Sacral osteomyelitis  First Dose: No Last Day of Therapy:  05/23/21 Labs - Once weekly:  CBC/D and BMP, Labs - Every other week:  ESR and CRP Method of administration: Mini-Bag Plus / Gravity Method of administration may be  changed at the discretion of home infusion pharmacist based upon assessment of the patient and/or caregiver's ability to self-administer the medication ordered. (Patient not taking: Reported on 06/08/2021), Disp: 41 Units, Rfl: 0   feeding supplement, GLUCERNA SHAKE, (GLUCERNA SHAKE) LIQD, Take 237 mLs by mouth 2 (two) times daily between meals., Disp: , Rfl: 0   ferrous sulfate 325 (65 FE) MG tablet, Take 325 mg by mouth 2 (two) times daily with a meal., Disp: , Rfl:    furosemide (LASIX) 20 MG tablet, Take 1 tablet (20 mg total) by mouth every other day., Disp: 30 tablet, Rfl:    gabapentin (NEURONTIN) 100 MG capsule, Take 200 mg by mouth 3 (three) times daily., Disp: , Rfl:    guaiFENesin (MUCINEX) 600 MG 12 hr tablet, Take 1,200 mg by mouth at bedtime. (2000), Disp: , Rfl:    insulin glargine (LANTUS) 100 UNIT/ML Solostar Pen, Inject 18 Units into the skin at bedtime. (2000), Disp: , Rfl:    loperamide (IMODIUM) 2 MG capsule, Take 4 mg by mouth daily as needed for diarrhea or loose stools (SUBSEQUENT DOSE GIVE ONE TAB AFTER EACH STOOL (max 8 mg/24 hrs.))., Disp: , Rfl:    magnesium oxide (MAG-OX) 400 MG tablet, Take 400 mg by mouth 2 (two) times daily., Disp: , Rfl:    methimazole (TAPAZOLE) 5 MG tablet, Take 1 tablet (5 mg total) by mouth daily. (Patient taking differently: Take 5 mg by mouth daily at 6 (six) AM. (0600)), Disp: 30 tablet, Rfl: 5   metoprolol succinate (TOPROL-XL) 50 MG 24 hr tablet, Take 50 mg by mouth daily., Disp: , Rfl:    Multiple Vitamin (MULTIVITAMIN WITH MINERALS) TABS tablet, Take 1 tablet by mouth daily., Disp: , Rfl:    Nutritional Supplements (,FEEDING SUPPLEMENT, PROSOURCE PLUS) liquid, Take 30 mLs by mouth 3 (three) times daily between meals., Disp: , Rfl:    ondansetron (ZOFRAN) 4 MG tablet, Take 4 mg by mouth every 6 (six) hours as needed for nausea or vomiting., Disp: , Rfl:    oxyCODONE (OXY IR/ROXICODONE) 5 MG immediate release tablet, Take 1 tablet (5 mg total)  by mouth every 6 (six) hours as needed for moderate pain., Disp: 10 tablet, Rfl: 0   pantoprazole (PROTONIX) 40 MG tablet, Take 40 mg by mouth daily at 6 (six) AM. (0600), Disp: , Rfl:    polyethylene glycol (MIRALAX / GLYCOLAX) 17 g packet, Take 17 g by mouth daily as needed., Disp: 14 each, Rfl: 0   potassium chloride SA (KLOR-CON) 20 MEQ tablet, Take 20 mEq by mouth 2 (two) times daily., Disp: , Rfl:    predniSONE (DELTASONE) 2.5 MG tablet, Take 2.5 mg by mouth every other day. (0800) Alternating days with 5 mg dose, Disp: , Rfl:    predniSONE (DELTASONE) 5 MG tablet, Take 5 mg by mouth every other day. (0800) Alternating days with 2.5 mg dose, Disp: , Rfl:    saccharomyces boulardii (FLORASTOR) 250 MG capsule, Take 250 mg by mouth 2 (two) times daily., Disp: , Rfl:    SF 5000 PLUS 1.1 % CREA dental cream, Take 1 application by mouth daily at 8 pm. (2000), Disp: , Rfl:    solifenacin (VESICARE) 10 MG tablet, Take 10 mg  by mouth daily. (0800), Disp: , Rfl:    TECFIDERA 240 MG CPDR, Take 1 capsule (240 mg total) by mouth 2 (two) times daily. (0800 & 2000), Disp: 60 capsule, Rfl: 11   vitamin C (ASCORBIC ACID) 500 MG tablet, Take 500 mg by mouth in the morning and at bedtime., Disp: , Rfl:    zinc sulfate 220 (50 Zn) MG capsule, Take 220 mg by mouth daily., Disp: , Rfl:     Review of Systems  Constitutional:  Negative for activity change, appetite change, chills, diaphoresis, fatigue, fever and unexpected weight change.  HENT:  Negative for congestion, rhinorrhea, sinus pressure, sneezing, sore throat and trouble swallowing.   Eyes:  Negative for photophobia and visual disturbance.  Respiratory:  Negative for cough, chest tightness, shortness of breath, wheezing and stridor.   Cardiovascular:  Negative for chest pain, palpitations and leg swelling.  Gastrointestinal:  Negative for abdominal distention, abdominal pain, anal bleeding, blood in stool, constipation, diarrhea, nausea and vomiting.   Genitourinary:  Negative for difficulty urinating, dysuria, flank pain and hematuria.  Musculoskeletal:  Negative for arthralgias, back pain, gait problem, joint swelling and myalgias.  Skin:  Positive for wound. Negative for color change, pallor and rash.  Neurological:  Negative for dizziness, tremors, weakness and light-headedness.  Hematological:  Negative for adenopathy. Does not bruise/bleed easily.  Psychiatric/Behavioral:  Negative for agitation, behavioral problems, confusion, decreased concentration, dysphoric mood and sleep disturbance.       Objective:   Physical Exam Constitutional:      General: She is not in acute distress.    Appearance: Normal appearance. She is well-developed. She is not ill-appearing or diaphoretic.  HENT:     Head: Normocephalic and atraumatic.     Right Ear: Hearing and external ear normal.     Left Ear: Hearing and external ear normal.     Nose: No nasal deformity or rhinorrhea.  Eyes:     General: No scleral icterus.    Conjunctiva/sclera: Conjunctivae normal.     Right eye: Right conjunctiva is not injected.     Left eye: Left conjunctiva is not injected.     Pupils: Pupils are equal, round, and reactive to light.  Neck:     Vascular: No JVD.  Cardiovascular:     Rate and Rhythm: Normal rate and regular rhythm.     Heart sounds: Normal heart sounds, S1 normal and S2 normal. No murmur heard.   No friction rub.  Abdominal:     General: Bowel sounds are normal. There is no distension.     Palpations: Abdomen is soft.     Tenderness: There is no abdominal tenderness.  Musculoskeletal:        General: Normal range of motion.     Right shoulder: Normal.     Left shoulder: Normal.     Cervical back: Normal range of motion and neck supple.     Right hip: Normal.     Left hip: Normal.     Right knee: Normal.     Left knee: Normal.  Lymphadenopathy:     Head:     Right side of head: No submandibular, preauricular or posterior auricular  adenopathy.     Left side of head: No submandibular, preauricular or posterior auricular adenopathy.     Cervical: No cervical adenopathy.     Right cervical: No superficial or deep cervical adenopathy.    Left cervical: No superficial or deep cervical adenopathy.  Skin:    General:  Skin is warm and dry.     Coloration: Skin is not pale.     Findings: No abrasion, ecchymosis or erythema.     Nails: There is no clubbing.  Neurological:     Mental Status: She is alert and oriented to person, place, and time.  Psychiatric:        Attention and Perception: She is attentive.        Speech: Speech normal.        Behavior: Behavior normal. Behavior is cooperative.        Thought Content: Thought content normal.        Judgment: Judgment normal.     She is bedbound she has a wound vacuum dressing in place.     Assessment & Plan:   Polymicrobial osteomyelitis of sacrum due to stage IV decubitus ulcer:  She is completed 6 weeks of ertapenem.  I will check a sed rate and C-reactive protein.  Clinically there does not appear to be any apparent worsening.  Continue wound care offloading she has diverting colostomy as well.  Leukocytosis: As noted she has a chronically elevated white blood cell count so I would not get too worked up about it being elevated or changing so much  The last white blood cell counts we have in our system were 15,000 14,000 715,000 616,700 from the end of her stay in October.   CBC Latest Ref Rng & Units 04/13/2021 04/12/2021 04/11/2021  WBC 4.0 - 10.5 K/uL 15.4(H) 14.7(H) 15.6(H)  Hemoglobin 12.0 - 15.0 g/dL 9.0(L) 8.5(L) 8.5(L)  Hematocrit 36.0 - 46.0 % 27.9(L) 26.1(L) 27.0(L)  Platelets 150 - 400 K/uL 525(H) 475(H) 490(H)     Low reaction: I am not sure why there was concerned about this.  We get worried about creatinine going up which would be reflective of renal disease a low creatinine is not something that worries me  Adrenal insufficiency  chronically on prednisone  MS on prednisone and another agent that she says effects and elevates her WBC  I will have her come back to see [place] in 2 months time.

## 2021-06-09 LAB — CBC WITH DIFFERENTIAL/PLATELET
Absolute Monocytes: 1119 cells/uL — ABNORMAL HIGH (ref 200–950)
Basophils Absolute: 50 cells/uL (ref 0–200)
Basophils Relative: 0.3 %
Eosinophils Absolute: 317 cells/uL (ref 15–500)
Eosinophils Relative: 1.9 %
HCT: 35 % (ref 35.0–45.0)
Hemoglobin: 10.6 g/dL — ABNORMAL LOW (ref 11.7–15.5)
Lymphs Abs: 1553 cells/uL (ref 850–3900)
MCH: 25.5 pg — ABNORMAL LOW (ref 27.0–33.0)
MCHC: 30.3 g/dL — ABNORMAL LOW (ref 32.0–36.0)
MCV: 84.1 fL (ref 80.0–100.0)
MPV: 10.9 fL (ref 7.5–12.5)
Monocytes Relative: 6.7 %
Neutro Abs: 13661 cells/uL — ABNORMAL HIGH (ref 1500–7800)
Neutrophils Relative %: 81.8 %
Platelets: 485 10*3/uL — ABNORMAL HIGH (ref 140–400)
RBC: 4.16 10*6/uL (ref 3.80–5.10)
RDW: 15.8 % — ABNORMAL HIGH (ref 11.0–15.0)
Total Lymphocyte: 9.3 %
WBC: 16.7 10*3/uL — ABNORMAL HIGH (ref 3.8–10.8)

## 2021-06-09 LAB — COMPLETE METABOLIC PANEL WITH GFR
AG Ratio: 0.9 (calc) — ABNORMAL LOW (ref 1.0–2.5)
ALT: 6 U/L (ref 6–29)
AST: 7 U/L — ABNORMAL LOW (ref 10–35)
Albumin: 3.1 g/dL — ABNORMAL LOW (ref 3.6–5.1)
Alkaline phosphatase (APISO): 86 U/L (ref 37–153)
BUN/Creatinine Ratio: 32 (calc) — ABNORMAL HIGH (ref 6–22)
BUN: 18 mg/dL (ref 7–25)
CO2: 29 mmol/L (ref 20–32)
Calcium: 9.5 mg/dL (ref 8.6–10.4)
Chloride: 98 mmol/L (ref 98–110)
Creat: 0.56 mg/dL — ABNORMAL LOW (ref 0.60–1.00)
Globulin: 3.4 g/dL (calc) (ref 1.9–3.7)
Glucose, Bld: 119 mg/dL — ABNORMAL HIGH (ref 65–99)
Potassium: 4.2 mmol/L (ref 3.5–5.3)
Sodium: 139 mmol/L (ref 135–146)
Total Bilirubin: 0.5 mg/dL (ref 0.2–1.2)
Total Protein: 6.5 g/dL (ref 6.1–8.1)
eGFR: 93 mL/min/{1.73_m2} (ref >=60)

## 2021-06-09 LAB — SEDIMENTATION RATE: Sed Rate: 111 mm/h — ABNORMAL HIGH (ref 0–30)

## 2021-06-09 LAB — C-REACTIVE PROTEIN: CRP: 86.6 mg/L — ABNORMAL HIGH (ref <8.0)

## 2021-06-11 ENCOUNTER — Telehealth: Payer: Self-pay

## 2021-06-11 ENCOUNTER — Other Ambulatory Visit: Payer: Self-pay | Admitting: Infectious Disease

## 2021-06-11 DIAGNOSIS — M4628 Osteomyelitis of vertebra, sacral and sacrococcygeal region: Secondary | ICD-10-CM

## 2021-06-11 NOTE — Progress Notes (Signed)
Pts ESR and CRP are super high. I am ordering a repeat MRI Sacrum

## 2021-06-11 NOTE — Telephone Encounter (Signed)
Patient returned call, relayed that Dr. Tommy Medal has ordered repeat MRI due to increased inflammatory markers and that office will reach out to schedule. Patient verbalized understanding and has no further questions.   Beryle Flock, RN

## 2021-06-11 NOTE — Telephone Encounter (Signed)
Called patient to relay Dr. Derek Mound message, no answer. Left HIPAA compliant voicemail requesting callback.   Beryle Flock, RN

## 2021-06-11 NOTE — Telephone Encounter (Signed)
-----   Message from Truman Hayward, MD sent at 06/11/2021  8:49 AM EST ----- I am ordering a repeat MRI of her sacrum given that her sed rate and CRP have dramatically increased ----- Message ----- From: Interface, [facility] Lab Results In Sent: 06/08/2021  11:16 PM EST To: Truman Hayward, MD

## 2021-06-12 ENCOUNTER — Non-Acute Institutional Stay: Payer: Medicare Other | Admitting: Hospice

## 2021-06-12 ENCOUNTER — Other Ambulatory Visit: Payer: Self-pay

## 2021-06-12 DIAGNOSIS — M4628 Osteomyelitis of vertebra, sacral and sacrococcygeal region: Secondary | ICD-10-CM

## 2021-06-12 DIAGNOSIS — N3289 Other specified disorders of bladder: Secondary | ICD-10-CM

## 2021-06-12 DIAGNOSIS — Z515 Encounter for palliative care: Secondary | ICD-10-CM

## 2021-06-12 DIAGNOSIS — G35 Multiple sclerosis: Secondary | ICD-10-CM

## 2021-06-12 DIAGNOSIS — N319 Neuromuscular dysfunction of bladder, unspecified: Secondary | ICD-10-CM

## 2021-06-12 NOTE — Progress Notes (Signed)
[facility] Consult Note Telephone: [phone]  Fax: [phone]  PATIENT NAME: Laura Roberts DOB: 02-Oct-1940 MRN: [id number]  PRIMARY CARE PROVIDER:   [name], MD Laura Limes, MD [address] [place],  [place] [address]  REFERRING PROVIDER: [name], MD Laura Limes, MD [address] [place],  [place] [address]  RESPONSIBLE PARTY:  Self Contact Information     Name Relation Home Work Mobile   Laura Roberts Son [phone]     Laura Roberts [phone]  [phone]       Visit is to build trust and highlight Palliative Medicine as specialized medical care for people living with serious illness, aimed at facilitating better quality of life through symptoms relief, assisting with advance care planning and complex medical decision making. This is a follow up visit.  RECOMMENDATIONS/PLAN:   Code Status: Patient affirmed she is a DNR. Signed DNR in facility chart and in [place].   Goals of Care: Goals of care include to maximize quality of life and symptom management. MOST form selections  include DO NOT RESUSCITATE, limited additional interventions, IV fluids as indicated, antibiotics as indicated, no feeding tube    Visit consisted of counseling and education dealing with the complex and emotionally intense issues of symptom management and palliative care in the setting of serious and potentially life-threatening illness. Palliative care team will continue to support patient, patient's family, and medical team.  Symptom management/Plan:  Sacral Osteomyelitis: Followed by Infectious Disease, was seen last at ID last week for labs/follow up. Inflammatory markers still elevated. Ertapenem completed as ordered. Wound VAC in place. Repositioning encouraged. Continue scheduled follow up with ID. Patient reports she received a call yesterday saying she will need another MRI, soon to be scheduled.  Routine CBC  ESR CRP CMP to monitor. FU with ID as planned.  Multiple sclerosis: Managed with Tecfidera, Neurontin, baclofen. Continue with restorative exercises and ongoing occupational therapy.  Neurogenic bladder: Suprapubic cath in place.  Followed by urologist.  Gets Botox injections.   Bladder spasm: Patient reports bladder spasms under control with the Botox injections and baclofen and Solifenacin Follow up: Palliative care will continue to follow for complex medical decision making, advance care planning, and clarification of goals. Return 6 weeks or prn. Encouraged to call provider sooner with any concerns.  CHIEF COMPLAINT: Palliative follow up  HISTORY OF PRESENT ILLNESS:  Laura Roberts a 80 y.o. female with multiple medical problems including sacral osteomyelitis for which [place] was completed;  wound VAC still in place and wound nurse changing dressings three times a week, wound care offlaoding - patient with diverting colostomy. Patient followed by Infectious Diseases, last seen 06/08/2021 for follow up and labs. Chart report from ID visit indicates patient with no apparent worsening despite elevated WBC and CRP - follow up in 2 months. She denied pain, no fever/chills. History of multiple sclerosis, neurogenic bladder, bladder spas,  paraplegia, type 2 diabetes mellitus, depression, hypertension.  Not able to ambulate due to MS progression, bedbound, Hoyer lift for transfers. History obtained from review of EMR, discussion with primary team, family and/or patient. Records reviewed and summarized above. All 10 point systems reviewed and are negative except as documented in history of present illness above  Review and summarization of Epic records shows history from other than patient.   Palliative Care was asked to follow this patient o help address complex decision making in the context of advance care planning  and goals of care clarification. I reviewed, as needed, available labs, patient  records, imaging, studies and related documents from the EMR.    PHYSICAL EXAM  General: In no acute distress, cooperative Cardiovascular: regular rate and rhythm, no edema in BLE Pulmonary: no cough, no increased work of breathing, normal respiratory effort Abdomen: soft, non tender, positive bowel sounds in all quadrants, colostomy to LUQ GU:  no suprapubic tenderness; suprapubic Size 18 [place] in place, patient with light yellow urine Eyes: Normal lids, no discharge, sclera anicteric ENMT: Moist mucous membranes Musculoskeletal: bed bound, hoyer lift for transfers, wound vac applied to sacral wound Skin: no rash to visible skin, warm without cyanosis Psych: coping Neurological: Weakness but otherwise non focal Heme/lymph/immuno: no bruises, no bleeding  PERTINENT MEDICATIONS:  Outpatient Encounter Medications as of 06/12/2021  Medication Sig   acetaminophen (TYLENOL) 325 MG tablet Take 650 mg by mouth every 6 (six) hours as needed for mild pain or fever.    Amino Acids-Protein Hydrolys (FEEDING SUPPLEMENT, PRO-STAT SUGAR FREE 64,) LIQD Take 30 mLs by mouth 2 (two) times daily.   baclofen (LIORESAL) 10 MG tablet Take 10-15 mg by mouth daily. (0800 & 1200) Take 1.5 tablets (15 mg) by mouth in the morning & take 1 tablet (10 mg) by mouth at noon.   baclofen (LIORESAL) 20 MG tablet Take 20 mg by mouth at bedtime. (2000)   barrier cream (NON-SPECIFIED) CREA Apply 1 application topically 2 (two) times daily. (0700 & 1900) Apply to right ischium and bilateral buttocks   calcium citrate (CALCITRATE - DOSED IN MG ELEMENTAL CALCIUM) 950 (200 Ca) MG tablet Take 200 mg of elemental calcium by mouth daily.   carboxymethylcellul-glycerin (OPTIVE) 0.5-0.9 % ophthalmic solution Place 1 drop into both eyes 4 (four) times daily as needed for dry eyes. (0800,1200, 1600 & 2000)   cholecalciferol (VITAMIN D) 25 MCG (1000 UNIT) tablet Take 1,000 Units by mouth daily.   cycloSPORINE (RESTASIS) 0.05 %  ophthalmic emulsion Place 1 drop into both eyes 2 (two) times daily. (0800 & 2000)   Dulaglutide (TRULICITY) 3 DU/2.0UR SOPN Inject 3 mg into the skin every Friday. (0800)   DULoxetine (CYMBALTA) 30 MG capsule Take 3 capsules (90 mg total) by mouth 2 (two) times daily. (0800)Give along with 30 mg to = 90 mg   feeding supplement, GLUCERNA SHAKE, (GLUCERNA SHAKE) LIQD Take 237 mLs by mouth 2 (two) times daily between meals.   ferrous sulfate 325 (65 FE) MG tablet Take 325 mg by mouth 2 (two) times daily with a meal.   furosemide (LASIX) 20 MG tablet Take 1 tablet (20 mg total) by mouth every other day.   gabapentin (NEURONTIN) 100 MG capsule Take 200 mg by mouth 3 (three) times daily.   guaiFENesin (MUCINEX) 600 MG 12 hr tablet Take 1,200 mg by mouth at bedtime. (2000)   insulin glargine (LANTUS) 100 UNIT/ML Solostar Pen Inject 18 Units into the skin at bedtime. (2000)   loperamide (IMODIUM) 2 MG capsule Take 4 mg by mouth daily as needed for diarrhea or loose stools (SUBSEQUENT DOSE GIVE ONE TAB AFTER EACH STOOL (max 8 mg/24 hrs.)).   magnesium oxide (MAG-OX) 400 MG tablet Take 400 mg by mouth 2 (two) times daily.   methimazole (TAPAZOLE) 5 MG tablet Take 1 tablet (5 mg total) by mouth daily. (Patient taking differently: Take 5 mg by mouth daily at 6 (six) AM. (0600))   metoprolol succinate (TOPROL-XL) 50 MG 24 hr tablet Take 50 mg by mouth  daily.   Multiple Vitamin (MULTIVITAMIN WITH MINERALS) TABS tablet Take 1 tablet by mouth daily.   Nutritional Supplements (,FEEDING SUPPLEMENT, PROSOURCE PLUS) liquid Take 30 mLs by mouth 3 (three) times daily between meals.   ondansetron (ZOFRAN) 4 MG tablet Take 4 mg by mouth every 6 (six) hours as needed for nausea or vomiting.   oxyCODONE (OXY IR/ROXICODONE) 5 MG immediate release tablet Take 1 tablet (5 mg total) by mouth every 6 (six) hours as needed for moderate pain.   pantoprazole (PROTONIX) 40 MG tablet Take 40 mg by mouth daily at 6 (six) AM. (0600)    polyethylene glycol (MIRALAX / GLYCOLAX) 17 g packet Take 17 g by mouth daily as needed.   potassium chloride SA (KLOR-CON) 20 MEQ tablet Take 20 mEq by mouth 2 (two) times daily.   predniSONE (DELTASONE) 2.5 MG tablet Take 2.5 mg by mouth every other day. (0800) Alternating days with 5 mg dose   predniSONE (DELTASONE) 5 MG tablet Take 5 mg by mouth every other day. (0800) Alternating days with 2.5 mg dose   SF 5000 PLUS 1.1 % CREA dental cream Take 1 application by mouth daily at 8 pm. (2000)   solifenacin (VESICARE) 10 MG tablet Take 10 mg by mouth daily. (0800)   TECFIDERA 240 MG CPDR Take 1 capsule (240 mg total) by mouth 2 (two) times daily. (0800 & 2000)   vitamin C (ASCORBIC ACID) 500 MG tablet Take 500 mg by mouth in the morning and at bedtime.   zinc sulfate 220 (50 Zn) MG capsule Take 220 mg by mouth daily.   No facility-administered encounter medications on file as of 06/12/2021.    HOSPICE ELIGIBILITY/DIAGNOSIS: TBD  PAST MEDICAL HISTORY:  Past Medical History:  Diagnosis Date   Age related osteoporosis    Aphasia    Bronchitis    hx of    Cellulitis and abscess of toe    hx of    Closed supracondylar fracture of right femur, periprosthetic 06/01/2014   Constipation    Cystostomy in place [facility])    Decubitus ulcer    Degenerative arthritis    osteoarthritis    Depression    Diabetes mellitus without complication (HCC)    type 2    Edema    localized    Full incontinence of feces    GERD (gastroesophageal reflux disease)    Gout    hx of    History of falling    Hyperlipidemia    Hypertension    Hyperthyroidism    Multiple sclerosis (HCC)    Neurogenic bowel    Neuromuscular disorder (HCC)    Bilateral hand carpal tunnel syndrome   Neuromuscular dysfunction of bladder    Nontoxic multinodular goiter    Obesity    Osteomyelitis of coccyx ([place]) 04/05/2021   Osteoporosis    Other adrenocortical insufficiency (HCC)    Paraplegia (HCC)    LEGS   Personal  history of urinary (tract) infections    Polyneuropathy    Pressure ulcer of right buttock, stage 4 (HCC)    Sacral osteomyelitis ([place]) 04/05/2021   Spinal stenosis in cervical region    Spinal stenosis of lumbosacral region    Spinal stenosis, thoracic    Tinea unguium    Vitamin D deficiency       ALLERGIES:  Allergies  Allergen Reactions   Codeine Other (See Comments)    Difficult breathing and skin problem   Ultram [Tramadol] Other (See Comments)  Difficult breathing and skin peeling   Januvia [Sitagliptin] Rash and Other (See Comments)    Blisters    I spent 35 minutes providing this consultation; time includes spent with patient/family, chart review and documentation. More than 50% of the time in this consultation was spent on care coordination  Thank you for the opportunity to participate in the care of Laura Roberts Please call our office at [phone] if we can be of additional assistance.  Note: Portions of this note were generated with [employer]. Dictation errors may occur despite best attempts at proofreading.  Teodoro Spray, NP

## 2021-06-26 ENCOUNTER — Ambulatory Visit (HOSPITAL_COMMUNITY): Payer: Medicare Other

## 2021-06-27 ENCOUNTER — Other Ambulatory Visit: Payer: Self-pay | Admitting: Internal Medicine

## 2021-06-27 ENCOUNTER — Ambulatory Visit (HOSPITAL_COMMUNITY)
Admission: RE | Admit: 2021-06-27 | Discharge: 2021-06-27 | Disposition: A | Discharge status: Home / Self Care | Payer: Medicare Other | Source: Ambulatory Visit | Attending: Infectious Disease | Admitting: Infectious Disease

## 2021-06-27 DIAGNOSIS — M4628 Osteomyelitis of vertebra, sacral and sacrococcygeal region: Secondary | ICD-10-CM

## 2021-06-27 MED ORDER — GADOBUTROL 1 MMOL/ML IV SOLN
8.0000 mL | Freq: Once | INTRAVENOUS | Status: AC | PRN
  Administered 2021-06-27: 13:00:00 8 mL via INTRAVENOUS

## 2021-06-28 ENCOUNTER — Telehealth: Payer: Self-pay

## 2021-06-28 NOTE — Telephone Encounter (Signed)
Dr. Juleen China is currently covering for Dr. Tommy Medal and has order MRI or right hip to further evaluate this area. MRI results related to sacral area with acute on chronic osteomyelitis. Patient made aware of MRI finding and new orders. [facility] staff with assist with scheduling and keep patient updated.  Eugenia Mcalpine

## 2021-06-28 NOTE — Telephone Encounter (Signed)
-----   Message from Mignon Pine, DO sent at 06/27/2021  5:19 PM EST ----- I am covering for Dr Tommy Medal.  Patients sacral MRI noted large, known sacral ulcer with acute on chronic osteomyelitis, but also partially seen rim enhancing collection that could be worrisome.  I have ordered a MRI of the right hip to further evaluate this area and will need help with scheduling this.  Thanks!

## 2021-07-01 HISTORY — PX: COLON SURGERY: SHX602

## 2021-07-03 ENCOUNTER — Telehealth: Payer: Self-pay

## 2021-07-03 NOTE — Telephone Encounter (Signed)
-----   Message from Truman Hayward, MD sent at 07/03/2021 11:30 AM EST ----- Can we also get her rescheduled with another provider since I will be back in the clinic till February ----- Message ----- From: Mignon Pine, DO Sent: 06/27/2021   5:19 PM EST To: Truman Hayward, MD, Bing [place], #  I am covering for Dr Tommy Medal.  Patients sacral MRI noted large, known sacral ulcer with acute on chronic osteomyelitis, but also partially seen rim enhancing collection that could be worrisome.  I have ordered a MRI of the right hip to further evaluate this area and will need help with scheduling this.  Thanks!

## 2021-07-03 NOTE — Telephone Encounter (Signed)
Patient scheduled 07/24/2021 with Dr.Wallace.

## 2021-07-09 ENCOUNTER — Ambulatory Visit (HOSPITAL_COMMUNITY)
Admission: RE | Admit: 2021-07-09 | Discharge: 2021-07-09 | Disposition: A | Discharge status: Home / Self Care | Payer: Medicare Other | Source: Ambulatory Visit | Attending: Internal Medicine | Admitting: Internal Medicine

## 2021-07-09 ENCOUNTER — Other Ambulatory Visit: Payer: Self-pay

## 2021-07-09 DIAGNOSIS — M4628 Osteomyelitis of vertebra, sacral and sacrococcygeal region: Secondary | ICD-10-CM | POA: Insufficient documentation

## 2021-07-09 MED ORDER — GADOBUTROL 1 MMOL/ML IV SOLN
8.0000 mL | Freq: Once | INTRAVENOUS | Status: AC | PRN
  Administered 2021-07-09: 8 mL via INTRAVENOUS

## 2021-07-11 ENCOUNTER — Telehealth: Payer: Self-pay | Admitting: Internal Medicine

## 2021-07-11 ENCOUNTER — Telehealth: Payer: Self-pay

## 2021-07-11 DIAGNOSIS — M4628 Osteomyelitis of vertebra, sacral and sacrococcygeal region: Secondary | ICD-10-CM

## 2021-07-11 MED ORDER — SULFAMETHOXAZOLE-TRIMETHOPRIM 800-160 MG PO TABS: 1.0000 | ORAL_TABLET | Freq: Two times a day (BID) | ORAL | 0 refills | Status: AC

## 2021-07-11 NOTE — Telephone Encounter (Signed)
Diagnosis: Sacral OM, infected right ischial ulcer  Culture Result:  Proteus mirabilis E coli  Allergies  Allergen Reactions   Codeine Other (See Comments)    Difficult breathing and skin problem   Ultram [Tramadol] Other (See Comments)    Difficult breathing and skin peeling   Januvia [Sitagliptin] Rash and Other (See Comments)    Blisters    OPAT Orders Discharge antibiotics to be given via PICC line Discharge antibiotics: Per pharmacy protocol Ertapenem 1gm daily IV  Duration: 6 weeks from date of PICC placement.   [facility] Care Per Protocol:  Home health RN for IV administration and teaching; PICC line care and labs.    Labs weekly while on IV antibiotics: _xx_ CBC with differential __ BMP _xx_ CMP _xx_ CRP _xx_ ESR __ Vancomycin trough __ CK  xx__ Please pull PIC at completion of IV antibiotics __ Please leave PIC in place until doctor has seen patient or been notified  Fax weekly labs to [phone]  Clinic Follow Up Appt: 1/24 with Juleen China 2/16 with Tommy Medal

## 2021-07-11 NOTE — Telephone Encounter (Signed)
-----   Message from Mignon Pine, DO sent at 07/11/2021  2:16 PM EST ----- Based on patients MRI results of right hip, it appears that she has an infected deep right ischial ulcer but fortunately no evidence of osteomyelitis in this area.    Her sacral MRI from 12/28 noted a large, deep sacral ulcer with acute-on-chronic OM of the residual sacrum despite having completed 6 weeks of ertapenem.  Unfortunately, the acute OM findings warrant another course of IV antibiotics.  She has follow up with myself on 1/24 and again with [facility] on 2/16 but I would like to start her on Bactrim 1 DS tablet BID pending PICC placement and I would like for her to follow up with surgery again to discuss debridement of the infected areas.  I will send in Rx for Bactrim, order PICC line, place surgery referral, and leave a separate note for OPAT orders.  Can you please update patient?  Thanks

## 2021-07-11 NOTE — Telephone Encounter (Signed)
Spoke with patient, updated her on IV antibiotic plan with Bactrim until this can be started. Notified her that Dr. Juleen China has placed referral to surgery. Asked that she please call with any further questions or concerns.   Beryle Flock, RN

## 2021-07-11 NOTE — Telephone Encounter (Signed)
Spoke with patient and notified her of antibiotic plan. She states she is being cared for at [facility]. RN spoke to [place], Marine scientist at [facility], and notified her that new PICC and IV antibiotic orders are being sent. Also notified her that patient will need to be on PO Bactrim until IV antibiotics can be started, orders also sent for that.   Nunzio Cory: [phone] F: [place], RN

## 2021-07-11 NOTE — Telephone Encounter (Signed)
Thank you Dr. Juleen China!

## 2021-07-12 NOTE — Telephone Encounter (Signed)
Orders faxed. Received receipt of successful fax transmission.   Beryle Flock, RN

## 2021-07-16 ENCOUNTER — Encounter (HOSPITAL_COMMUNITY): Payer: Self-pay

## 2021-07-16 ENCOUNTER — Ambulatory Visit (HOSPITAL_COMMUNITY): Payer: Medicare Other

## 2021-07-24 ENCOUNTER — Encounter: Payer: Self-pay | Admitting: Internal Medicine

## 2021-07-24 ENCOUNTER — Ambulatory Visit (INDEPENDENT_AMBULATORY_CARE_PROVIDER_SITE_OTHER): Payer: Commercial Managed Care - HMO | Admitting: Internal Medicine

## 2021-07-24 ENCOUNTER — Other Ambulatory Visit: Payer: Self-pay

## 2021-07-24 ENCOUNTER — Telehealth: Payer: Self-pay

## 2021-07-24 VITALS — BP 122/68 | HR 87 | Temp 97.2°F

## 2021-07-24 DIAGNOSIS — M4628 Osteomyelitis of vertebra, sacral and sacrococcygeal region: Secondary | ICD-10-CM

## 2021-07-24 NOTE — Telephone Encounter (Signed)
Spoke with Madilyn Fireman, Rn and gave verbal orders per Dr. Juleen China for facility to draw Sed rate and CRP now and fax results to [facility] at [phone]. Verbal orders read back and understood.  Eugenia Mcalpine

## 2021-07-24 NOTE — Assessment & Plan Note (Signed)
Will repeat ESR, CRP today as she has been on antibiotics now for about 2 weeks.  Will continue with Ertapenem 1gm daily IV via PICC line for 6 weeks total.  Continue wound care, off loading.  Patient has follow up in place with Dr Tommy Medal next month.  FOllow up with surgery next week as well.

## 2021-07-24 NOTE — Patient Instructions (Signed)
Thank you for coming to see me today. It was a pleasure seeing you.  -- Continue with IV ertapenem 1gm daily via PICC line for 6 weeks -- End date of antibiotics = 08/24/21 -- Follow up with your surgeon for any debridement of infected tissue -- Follow up with Dr Tommy Medal here as scheduled  If you have any questions or concerns, please do not hesitate to call the office at [phone].  Take Care,   Jule Ser

## 2021-07-24 NOTE — Telephone Encounter (Signed)
I spoke with [facility] ([phone])and requested to speak to a nurse to give orders for patient to have a Sed Rate and CRP drawn ASAP. A nurse was unavailable to take orders, so call back number was given for a nurse to give orders for patient to have Sed rate and CRP drawn per Dr. Juleen China.  Cannon Arreola T Brooks Sailors

## 2021-07-24 NOTE — Progress Notes (Signed)
[facility] Infectious Disease  CHIEF COMPLAINT:    Follow up for osteomyelitis  SUBJECTIVE:    Laura Roberts is a 81 y.o. female with PMHx as below who presents to the clinic for osteomyelitis.   Patient has been following with Dr Tommy Medal for a large sacral wound and OM s/p 6 weeks of ertapenem.  She was seen 12/9 for leukocytosis from her SNF.  Repeat ESR/CRP were obtained and found to be elevated. MRI sacrum was pursued on 12/28.  THis showed a large deep sacral decubitus ulcer with underlying acute on chronic OM of the residual sacrum.  MRI also noted rim enhancing collection in the proximal hamstrings.  Right hip MRI recommended and obtained 1/9 that showed deep right ischial ulcer with rim enhancing fluid drainage.  Fortunately, no evidence of OM of the ischial tuberosity.    She was started on PO Bactrim ~07/11/21 and plan was implemented for her to receive a PICC line for 6 weeks of ertapenem for treatment of her sacral osteomyelitis and infected right ischial ulcer.  She was scheduled for PICC placement on 07/16/21, however, this appointment was cancelled as she had PICC placed at facility around this time.  She is unsure of the date but documents indicate 07/13/21 as date of placement and start of ertapenem.    Today, she reports no new problems.  She is tolerating Ertapenem.  She has not had follow up with surgery as of yet to consider debridement of infected tissue but is scheduled for 1/31.  She has a wound vac in place on her right hip.  Please see A&P for the details of today's visit and status of the patient's medical problems.   Patient's Medications  New Prescriptions   No medications on file  Previous Medications   ACETAMINOPHEN (TYLENOL) 325 MG TABLET    Take 650 mg by mouth every 6 (six) hours as needed for mild pain or fever.    AMINO ACIDS-PROTEIN HYDROLYS (FEEDING SUPPLEMENT, PRO-STAT SUGAR FREE 64,) LIQD    Take 30 mLs by mouth 2 (two) times daily.    BACLOFEN (LIORESAL) 10 MG TABLET    Take 10-15 mg by mouth daily. (0800 & 1200) Take 1.5 tablets (15 mg) by mouth in the morning & take 1 tablet (10 mg) by mouth at noon.   BACLOFEN (LIORESAL) 20 MG TABLET    Take 20 mg by mouth at bedtime. (2000)   BARRIER CREAM (NON-SPECIFIED) CREA    Apply 1 application topically 2 (two) times daily. (0700 & 1900) Apply to right ischium and bilateral buttocks   CALCIUM CITRATE (CALCITRATE - DOSED IN MG ELEMENTAL CALCIUM) 950 (200 CA) MG TABLET    Take 200 mg of elemental calcium by mouth daily.   CARBOXYMETHYLCELLUL-GLYCERIN (OPTIVE) 0.5-0.9 % OPHTHALMIC SOLUTION    Place 1 drop into both eyes 4 (four) times daily as needed for dry eyes. (0800,1200, 1600 & 2000)   CHOLECALCIFEROL (VITAMIN D) 25 MCG (1000 UNIT) TABLET    Take 1,000 Units by mouth daily.   CYCLOSPORINE (RESTASIS) 0.05 % OPHTHALMIC EMULSION    Place 1 drop into both eyes 2 (two) times daily. (0800 & 2000)   DULAGLUTIDE (TRULICITY) 3 LN/7.9JK SOPN    Inject 3 mg into the skin every Friday. (0800)   DULOXETINE (CYMBALTA) 30 MG CAPSULE    Take 3 capsules (90 mg total) by mouth 2 (two) times daily. (0800)Give along with 30 mg to = 90  mg   ERTAPENEM (INVANZ) 1 G INJECTION       FEEDING SUPPLEMENT, GLUCERNA SHAKE, (GLUCERNA SHAKE) LIQD    Take 237 mLs by mouth 2 (two) times daily between meals.   FERROUS SULFATE 325 (65 FE) MG TABLET    Take 325 mg by mouth 2 (two) times daily with a meal.   FUROSEMIDE (LASIX) 20 MG TABLET    Take 1 tablet (20 mg total) by mouth every other day.   GABAPENTIN (NEURONTIN) 100 MG CAPSULE    Take 200 mg by mouth 3 (three) times daily.   GUAIFENESIN (MUCINEX) 600 MG 12 HR TABLET    Take 1,200 mg by mouth at bedtime. (2000)   INSULIN GLARGINE (LANTUS) 100 UNIT/ML SOLOSTAR PEN    Inject 18 Units into the skin at bedtime. (2000)   LOPERAMIDE (IMODIUM) 2 MG CAPSULE    Take 4 mg by mouth daily as needed for diarrhea or loose stools (SUBSEQUENT DOSE GIVE ONE TAB AFTER EACH STOOL  (max 8 mg/24 hrs.)).   MAGNESIUM OXIDE (MAG-OX) 400 MG TABLET    Take 400 mg by mouth 2 (two) times daily.   METHIMAZOLE (TAPAZOLE) 5 MG TABLET    Take 1 tablet (5 mg total) by mouth daily.   METOPROLOL SUCCINATE (TOPROL-XL) 50 MG 24 HR TABLET    Take 50 mg by mouth daily.   MULTIPLE VITAMIN (MULTIVITAMIN WITH MINERALS) TABS TABLET    Take 1 tablet by mouth daily.   NUTRITIONAL SUPPLEMENTS (,FEEDING SUPPLEMENT, PROSOURCE PLUS) LIQUID    Take 30 mLs by mouth 3 (three) times daily between meals.   ONDANSETRON (ZOFRAN) 4 MG TABLET    Take 4 mg by mouth every 6 (six) hours as needed for nausea or vomiting.   OXYCODONE (OXY IR/ROXICODONE) 5 MG IMMEDIATE RELEASE TABLET    Take 1 tablet (5 mg total) by mouth every 6 (six) hours as needed for moderate pain.   PANTOPRAZOLE (PROTONIX) 40 MG TABLET    Take 40 mg by mouth daily at 6 (six) AM. (0600)   POLYETHYLENE GLYCOL (MIRALAX / GLYCOLAX) 17 G PACKET    Take 17 g by mouth daily as needed.   POTASSIUM CHLORIDE SA (KLOR-CON) 20 MEQ TABLET    Take 20 mEq by mouth 2 (two) times daily.   PREDNISONE (DELTASONE) 2.5 MG TABLET    Take 2.5 mg by mouth every other day. (0800) Alternating days with 5 mg dose   PREDNISONE (DELTASONE) 5 MG TABLET    Take 5 mg by mouth every other day. (0800) Alternating days with 2.5 mg dose   SF 5000 PLUS 1.1 % CREA DENTAL CREAM    Take 1 application by mouth daily at 8 pm. (2000)   SOLIFENACIN (VESICARE) 10 MG TABLET    Take 10 mg by mouth daily. (0800)   TECFIDERA 240 MG CPDR    Take 1 capsule (240 mg total) by mouth 2 (two) times daily. (0800 & 2000)   VITAMIN C (ASCORBIC ACID) 500 MG TABLET    Take 500 mg by mouth in the morning and at bedtime.   ZINC SULFATE 220 (50 ZN) MG CAPSULE    Take 220 mg by mouth daily.  Modified Medications   No medications on file  Discontinued Medications   No medications on file      Past Medical History:  Diagnosis Date   Age related osteoporosis    Aphasia    Bronchitis    hx of     Cellulitis  and abscess of toe    hx of    Closed supracondylar fracture of right femur, periprosthetic 06/01/2014   Constipation    Cystostomy in place [facility])    Decubitus ulcer    Degenerative arthritis    osteoarthritis    Depression    Diabetes mellitus without complication (HCC)    type 2    Edema    localized    Full incontinence of feces    GERD (gastroesophageal reflux disease)    Gout    hx of    History of falling    Hyperlipidemia    Hypertension    Hyperthyroidism    Multiple sclerosis (HCC)    Neurogenic bowel    Neuromuscular disorder (HCC)    Bilateral hand carpal tunnel syndrome   Neuromuscular dysfunction of bladder    Nontoxic multinodular goiter    Obesity    Osteomyelitis of coccyx ([place]) 04/05/2021   Osteoporosis    Other adrenocortical insufficiency (HCC)    Paraplegia (HCC)    LEGS   Personal history of urinary (tract) infections    Polyneuropathy    Pressure ulcer of right buttock, stage 4 (HCC)    Sacral osteomyelitis ([place]) 04/05/2021   Spinal stenosis in cervical region    Spinal stenosis of lumbosacral region    Spinal stenosis, thoracic    Tinea unguium    Vitamin D deficiency     Social History   Tobacco Use   Smoking status: Never   Smokeless tobacco: Never  Vaping Use   Vaping Use: Never used  Substance Use Topics   Alcohol use: No   Drug use: No    Family History  Problem Relation Age of Onset   Multiple sclerosis Other        neices.    Cancer Mother    Diabetes Mother    GI Bleed Sister        diverticulitis   Thyroid disease Neg Hx     Allergies  Allergen Reactions   Codeine Other (See Comments)    Difficult breathing and skin problem   Ultram [Tramadol] Other (See Comments)    Difficult breathing and skin peeling   Januvia [Sitagliptin] Rash and Other (See Comments)    Blisters    Review of Systems  All other systems reviewed and are negative. Except as otherwise noted.   OBJECTIVE:    Vitals:    07/24/21 0953  BP: 122/68  Pulse: 87  Temp: (!) 97.2 F (36.2 C)  TempSrc: Temporal  SpO2: 96%   There is no height or weight on file to calculate BMI.  Physical Exam Constitutional:      General: She is not in acute distress.    Appearance: Normal appearance.  HENT:     Head: Normocephalic and atraumatic.  Pulmonary:     Effort: Pulmonary effort is normal. No respiratory distress.  Abdominal:     General: Abdomen is flat. There is no distension.     Palpations: Abdomen is soft.  Musculoskeletal:     Comments: Wound vac in place right hip. Picc line in place.  Skin:    General: Skin is warm and dry.     Findings: No rash.  Neurological:     General: No focal deficit present.     Mental Status: She is alert and oriented to person, place, and time.  Psychiatric:        Mood and Affect: Mood normal.  Behavior: Behavior normal.     Labs and Microbiology: CBC Latest Ref Rng & Units 06/08/2021 04/13/2021 04/12/2021  WBC 3.8 - 10.8 Thousand/uL 16.7(H) 15.4(H) 14.7(H)  Hemoglobin 11.7 - 15.5 g/dL 10.6(L) 9.0(L) 8.5(L)  Hematocrit 35.0 - 45.0 % 35.0 27.9(L) 26.1(L)  Platelets 140 - 400 Thousand/uL 485(H) 525(H) 475(H)   CMP Latest Ref Rng & Units 06/08/2021 04/13/2021 04/12/2021  Glucose 65 - 99 mg/dL 119(H) 115(H) 84  BUN 7 - 25 mg/dL 18 9 8   Creatinine 0.60 - 1.00 mg/dL 0.56(L) 0.61 0.35(L)  Sodium 135 - 146 mmol/L 139 136 142  Potassium 3.5 - 5.3 mmol/L 4.2 3.6 3.6  Chloride 98 - 110 mmol/L 98 89(L) 93(L)  CO2 20 - 32 mmol/L 29 40(H) 41(H)  Calcium 8.6 - 10.4 mg/dL 9.5 8.4(L) 8.9  Total Protein 6.1 - 8.1 g/dL 6.5 - -  Total Bilirubin 0.2 - 1.2 mg/dL 0.5 - -  Alkaline Phos 48 - 121 IU/L - - -  AST 10 - 35 U/L 7(L) - -  ALT 6 - 29 U/L 6 - -       ASSESSMENT & PLAN:    Sacral osteomyelitis (HCC) Will repeat ESR, CRP today as she has been on antibiotics now for about 2 weeks.  Will continue with Ertapenem 1gm daily IV via PICC line for 6 weeks total.   Continue wound care, off loading.  Patient has follow up in place with Dr Tommy Medal next month.  FOllow up with surgery next week as well.   Orders Placed This Encounter  Procedures   Sedimentation rate   C-reactive protein         [facility] 07/24/2021, 10:12 AM   I spent 40 minutes dedicated to the care of this patient on the date of this encounter to include pre-visit review of records, face-to-face time with the patient discussing sacral OM, and post-visit ordering of testing.

## 2021-08-02 ENCOUNTER — Telehealth: Payer: Self-pay | Admitting: Internal Medicine

## 2021-08-02 NOTE — Telephone Encounter (Signed)
OPAT Labs 07/25/21 from [facility]:  CRP (HS) 1.39 (range 0-0.50) ESR 61 (range 0-25)   [facility] 08/02/2021, 9:03 AM

## 2021-08-13 ENCOUNTER — Ambulatory Visit: Payer: Medicare Other | Admitting: Infectious Disease

## 2021-08-14 ENCOUNTER — Other Ambulatory Visit: Payer: Self-pay | Admitting: Urology

## 2021-08-16 ENCOUNTER — Ambulatory Visit: Payer: Medicare Other | Admitting: Infectious Disease

## 2021-08-24 ENCOUNTER — Non-Acute Institutional Stay: Payer: Medicare Other | Admitting: Hospice

## 2021-08-24 ENCOUNTER — Other Ambulatory Visit: Payer: Self-pay

## 2021-08-24 DIAGNOSIS — M4628 Osteomyelitis of vertebra, sacral and sacrococcygeal region: Secondary | ICD-10-CM

## 2021-08-24 DIAGNOSIS — N319 Neuromuscular dysfunction of bladder, unspecified: Secondary | ICD-10-CM

## 2021-08-24 DIAGNOSIS — G35 Multiple sclerosis: Secondary | ICD-10-CM

## 2021-08-24 DIAGNOSIS — Z515 Encounter for palliative care: Secondary | ICD-10-CM

## 2021-08-24 NOTE — Progress Notes (Signed)
[facility] Consult Note Telephone: [phone]  Fax: [phone]  PATIENT NAME: Laura Roberts DOB: 1940-10-24 MRN: [id number]  PRIMARY CARE PROVIDER:   [name], MD [name], MD [address] [place],  [place] [address]  REFERRING PROVIDER: [name], MD [name], MD [address] [place],  [place] [address]  RESPONSIBLE PARTY:  Self Contact Information     Name Relation Home Work Mobile   [place] III Son [phone]     Laasia, Arcos [phone]  [phone]       Visit is to build trust and highlight Palliative Medicine as specialized medical care for people living with serious illness, aimed at facilitating better quality of life through symptoms relief, assisting with advance care planning and complex medical decision making. This is a follow up visit.  RECOMMENDATIONS/PLAN:   Code Status: Patient affirmed she is a DNR. Signed DNR in facility chart and in [facility].   Goals of Care: Goals of care include to maximize quality of life and symptom management. MOST form selections  include DO NOT RESUSCITATE, limited additional interventions, IV fluids as indicated, antibiotics as indicated, no feeding tube  Palliative care team will continue to support patient, patient's family, and medical team.  Symptom management/Plan:  Sacral Osteomyelitis: Followed by Infectious Disease, was seen last 07/24/2021 Continuing on Ertapenem as ordered.  Wound VAC in place, wound care offloading. Repositioning encouraged.  Follow-up visit with ID 3//7/23.  Routine CBC ESR CRP CMP to monitor.  Multiple sclerosis: Managed with Tecfidera, Neurontin, baclofen. Continue with restorative exercises and ongoing occupational therapy.  Nonambulatory, Hoyer lift for all transfers. Neurogenic bladder: Suprapubic cath in place.  Followed by urologist.  Marguerite Olea Botox injections and Solifenacin for bladder spasm Follow up: Palliative  care will continue to follow for complex medical decision making, advance care planning, and clarification of goals. Return 6 weeks or prn. Encouraged to call provider sooner with any concerns.  CHIEF COMPLAINT: Palliative follow up  HISTORY OF PRESENT ILLNESS:  Laura Roberts a 81 y.o. female with multiple medical problems including sacral osteomyelitis for which Et[place]penem will be completed today;  wound VAC still in place, wound offloading is ongoing - patient with diverting colostomy. Patient followed by Infectious Diseases. She denied pain, no fever/chills. History of multiple sclerosis, neurogenic bladder, bladder spasm,  paraplegia, type 2 diabetes mellitus, depression, hypertension. History obtained from review of EMR, discussion with primary team, family and/or patient. Records reviewed and summarized above. All 10 point systems reviewed and are negative except as documented in history of present illness above  Review and summarization of Epic records shows history from other than patient.   Palliative Care was asked to follow this patient o help address complex decision making in the context of advance care planning and goals of care clarification. I reviewed, as needed, available labs, patient records, imaging, studies and related documents from the EMR.    PHYSICAL EXAM  General: In no acute distress, cooperative Cardiovascular: regular rate and rhythm, no edema in BLE Pulmonary: no cough, no increased work of breathing, normal respiratory effort Abdomen: soft, non tender, positive bowel sounds in all quadrants, colostomy to LUQ GU:  no suprapubic tenderness; suprapubic Size 18 [place] in place, patient with light yellow urine Eyes: Normal lids, no discharge, sclera anicteric ENMT: Moist mucous membranes Musculoskeletal: bed bound, hoyer lift for transfers, wound vac applied to sacral wound Skin: no rash to visible skin, warm without cyanosis,  dressing to sacral wound clean dry and  intact Psych: coping Neurological: Weakness but otherwise non focal Heme/lymph/immuno: no bruises, no bleeding  PERTINENT MEDICATIONS:  Outpatient Encounter Medications as of 08/24/2021  Medication Sig   acetaminophen (TYLENOL) 325 MG tablet Take 650 mg by mouth every 6 (six) hours as needed for mild pain or fever.    Amino Acids-Protein Hydrolys (FEEDING SUPPLEMENT, PRO-STAT SUGAR FREE 64,) LIQD Take 30 mLs by mouth 2 (two) times daily.   baclofen (LIORESAL) 10 MG tablet Take 10-15 mg by mouth daily. (0800 & 1200) Take 1.5 tablets (15 mg) by mouth in the morning & take 1 tablet (10 mg) by mouth at noon.   baclofen (LIORESAL) 20 MG tablet Take 20 mg by mouth at bedtime. (2000)   barrier cream (NON-SPECIFIED) CREA Apply 1 application topically 2 (two) times daily. (0700 & 1900) Apply to right ischium and bilateral buttocks   calcium citrate (CALCITRATE - DOSED IN MG ELEMENTAL CALCIUM) 950 (200 Ca) MG tablet Take 200 mg of elemental calcium by mouth daily.   carboxymethylcellul-glycerin (OPTIVE) 0.5-0.9 % ophthalmic solution Place 1 drop into both eyes 4 (four) times daily as needed for dry eyes. (0800,1200, 1600 & 2000)   cholecalciferol (VITAMIN D) 25 MCG (1000 UNIT) tablet Take 1,000 Units by mouth daily.   cycloSPORINE (RESTASIS) 0.05 % ophthalmic emulsion Place 1 drop into both eyes 2 (two) times daily. (0800 & 2000)   Dulaglutide (TRULICITY) 3 XI/3.3AS SOPN Inject 3 mg into the skin every Friday. (0800)   DULoxetine (CYMBALTA) 30 MG capsule Take 3 capsules (90 mg total) by mouth 2 (two) times daily. (0800)Give along with 30 mg to = 90 mg   ertapenem (INVANZ) 1 g injection    feeding supplement, GLUCERNA SHAKE, (GLUCERNA SHAKE) LIQD Take 237 mLs by mouth 2 (two) times daily between meals.   ferrous sulfate 325 (65 FE) MG tablet Take 325 mg by mouth 2 (two) times daily with a meal.   furosemide (LASIX) 20 MG tablet Take 1 tablet (20 mg total) by mouth every other day.   gabapentin (NEURONTIN)  100 MG capsule Take 200 mg by mouth 3 (three) times daily.   guaiFENesin (MUCINEX) 600 MG 12 hr tablet Take 1,200 mg by mouth at bedtime. (2000)   insulin glargine (LANTUS) 100 UNIT/ML Solostar Pen Inject 18 Units into the skin at bedtime. (2000)   loperamide (IMODIUM) 2 MG capsule Take 4 mg by mouth daily as needed for diarrhea or loose stools (SUBSEQUENT DOSE GIVE ONE TAB AFTER EACH STOOL (max 8 mg/24 hrs.)).   magnesium oxide (MAG-OX) 400 MG tablet Take 400 mg by mouth 2 (two) times daily.   methimazole (TAPAZOLE) 5 MG tablet Take 1 tablet (5 mg total) by mouth daily. (Patient taking differently: Take 5 mg by mouth daily at 6 (six) AM. (0600))   metoprolol succinate (TOPROL-XL) 50 MG 24 hr tablet Take 50 mg by mouth daily.   Multiple Vitamin (MULTIVITAMIN WITH MINERALS) TABS tablet Take 1 tablet by mouth daily.   Nutritional Supplements (,FEEDING SUPPLEMENT, PROSOURCE PLUS) liquid Take 30 mLs by mouth 3 (three) times daily between meals.   ondansetron (ZOFRAN) 4 MG tablet Take 4 mg by mouth every 6 (six) hours as needed for nausea or vomiting.   oxyCODONE (OXY IR/ROXICODONE) 5 MG immediate release tablet Take 1 tablet (5 mg total) by mouth every 6 (six) hours as needed for moderate pain.   pantoprazole (PROTONIX) 40 MG tablet Take 40 mg by mouth daily at  6 (six) AM. (0600)   polyethylene glycol (MIRALAX / GLYCOLAX) 17 g packet Take 17 g by mouth daily as needed.   potassium chloride SA (KLOR-CON) 20 MEQ tablet Take 20 mEq by mouth 2 (two) times daily.   predniSONE (DELTASONE) 2.5 MG tablet Take 2.5 mg by mouth every other day. (0800) Alternating days with 5 mg dose   predniSONE (DELTASONE) 5 MG tablet Take 5 mg by mouth every other day. (0800) Alternating days with 2.5 mg dose   SF 5000 PLUS 1.1 % CREA dental cream Take 1 application by mouth daily at 8 pm. (2000)   solifenacin (VESICARE) 10 MG tablet Take 10 mg by mouth daily. (0800)   TECFIDERA 240 MG CPDR Take 1 capsule (240 mg total) by  mouth 2 (two) times daily. (0800 & 2000)   vitamin C (ASCORBIC ACID) 500 MG tablet Take 500 mg by mouth in the morning and at bedtime.   zinc sulfate 220 (50 Zn) MG capsule Take 220 mg by mouth daily.   No facility-administered encounter medications on file as of 08/24/2021.    HOSPICE ELIGIBILITY/DIAGNOSIS: TBD  PAST MEDICAL HISTORY:  Past Medical History:  Diagnosis Date   Age related osteoporosis    Aphasia    Bronchitis    hx of    Cellulitis and abscess of toe    hx of    Closed supracondylar fracture of right femur, periprosthetic 06/01/2014   Constipation    Cystostomy in place [facility])    Decubitus ulcer    Degenerative arthritis    osteoarthritis    Depression    Diabetes mellitus without complication (HCC)    type 2    Edema    localized    Full incontinence of feces    GERD (gastroesophageal reflux disease)    Gout    hx of    History of falling    Hyperlipidemia    Hypertension    Hyperthyroidism    Multiple sclerosis (HCC)    Neurogenic bowel    Neuromuscular disorder (HCC)    Bilateral hand carpal tunnel syndrome   Neuromuscular dysfunction of bladder    Nontoxic multinodular goiter    Obesity    Osteomyelitis of coccyx ([place]) 04/05/2021   Osteoporosis    Other adrenocortical insufficiency (HCC)    Paraplegia (HCC)    LEGS   Personal history of urinary (tract) infections    Polyneuropathy    Pressure ulcer of right buttock, stage 4 (HCC)    Sacral osteomyelitis ([place]) 04/05/2021   Spinal stenosis in cervical region    Spinal stenosis of lumbosacral region    Spinal stenosis, thoracic    Tinea unguium    Vitamin D deficiency       ALLERGIES:  Allergies  Allergen Reactions   Codeine Other (See Comments)    Difficult breathing and skin problem   Ultram [Tramadol] Other (See Comments)    Difficult breathing and skin peeling   Januvia [Sitagliptin] Rash and Other (See Comments)    Blisters    I spent 45 minutes providing this consultation; time  includes spent with patient/family, chart review and documentation. More than 50% of the time in this consultation was spent on care coordination  Thank you for the opportunity to participate in the care of DORRIE COCUZZA Please call our office at [phone] if we can be of additional assistance.  Note: Portions of this note were generated with [employer]. Dictation errors may occur despite best attempts  at proofreading.  Teodoro Spray, NP

## 2021-08-31 NOTE — Progress Notes (Signed)
Short Stay: ?COVID testing date: N/A ?  ?Anesthesia:  ?PCP - [name], MD last office visit note 08/24/21 in epic by Laverda Sorenson NP ?Cardiologist - N/A ?  ?Chest x-ray - 04/05/21 in epic ?EKG - 04/06/21 in epic ?Stress Test -  N/A ?ECHO -  N/A ?Cardiac Cath -  N/A ?Pacemaker/ICD device last checked: N/A ?  ?Sleep Study -  N/A ?CPAP -  N/A ?  ?Fasting Blood Sugar -  ?Checks Blood Sugar _____ times a day ?  ?Blood Thinner Instructions: N/A ?Aspirin Instructions: N/A ?Last Dose: N/A ?  ?Activity level:   paraplegic ?                                                 ?  ?Anesthesia review: Multiple sclerosis with extensive brain and spinal cord involvement with a paraplegia,Poor R wave progression on EKG 09/13/20 in epic, HTN, DM ?  ?Patient denies shortness of breath, fever, cough and chest pain at PAT appointment ?

## 2021-08-31 NOTE — Patient Instructions (Addendum)
Preop instructions for: Laura Roberts  ?Date of Birth: 11/26/1940                   ?Date of Procedure:   09/03/21 ?Procedure:   CYSTOSCOPY BOTOX INJECTION, SUPRAPUBIC CATHETER EXCHANGE ?Surgeon:  Dr. Link Snuffer ?Facility contact: [facility]    Phone: [facility]: ?RN contact name/phone#:                          and Fax #: [phone] ?  ?Transportation contact phone#:  ?  ?  ?Time to arrive at [facility]: 9:00 AM ?  ?Report to: Admitting (On your left hand side)  ?  ?Do not eat or drink past midnight the night before your procedure.(To include any tube feedings-must be discontinued) ?  ?Take these morning medications only with sips of water.(or give through gastrostomy or feeding tube). Baclofen, Duloxetine, Gabapentin, Methimazole, Metoprolol, Pantoprazole, Prednisone, Solifenacin, Tecfidera, Use eyedrops ?  ?The Day Before Surgery: Take only half (1/2) of Lantus at bedtime ?  ?Note: No Insulin or Diabetic meds should be given or taken the morning of the procedure! ?  ?Please send day of procedure:current med list and meds last taken that day, confirm nothing by mouth status from what time, ?Patient Demographic info( to include DNR status, problem list, allergies) ?  ?Bring Insurance card and picture ID ?Leave all jewelry and other valuables at place where living( no metal or rings to be worn) ?No contact lens ?Women-no make-up, no lotions,perfumes,powders ?  ?Any questions day of procedure,call  SHORT STAY-[phone] ?  ?  ?Sent from :[facility] ?                  Phone:[phone] ?                  [phone] ?  ?Sent by :   Harlon Flor BSN, RN  ?

## 2021-09-02 NOTE — Anesthesia Preprocedure Evaluation (Addendum)
Anesthesia Evaluation  ?Patient identified by MRN, date of birth, ID band ?Patient awake ? ? ? ?Reviewed: ?Allergy & Precautions, NPO status , Patient's Chart, lab work & pertinent test results ? ?Airway ?Mallampati: II ? ?TM Distance: >3 FB ?Neck ROM: Full ? ? ? Dental ?no notable dental hx. ?(+) Teeth Intact, Dental Advisory Given ?  ?Pulmonary ? ?  ?Pulmonary exam normal ?breath sounds clear to auscultation ? ? ? ? ? ? Cardiovascular ?hypertension, Normal cardiovascular exam ?Rhythm:Regular Rate:Normal ? ? ?  ?Neuro/Psych ?(MS, LE paraplegia, polyneuropathy) ? Neuromuscular disease   ? GI/Hepatic ?GERD  ,Fecal incontinence ?  ?Endo/Other  ?diabetes, Type 2Hyperthyroidism Non toxic multinodular goiter ? Renal/GU ?  ? ?  ?Musculoskeletal ? ?(+) Arthritis ,  ? Abdominal ?  ?Peds ? Hematology ?  ?Anesthesia Other Findings ?All: Codeine, Ultram, Januvia ? Reproductive/Obstetrics ? ?  ? ? ? ? ? ? ? ? ? ? ? ? ? ?  ?  ? ? ? ? ? ? ? ?Anesthesia Physical ?Anesthesia Plan ? ?ASA: 3 ? ?Anesthesia Plan: General  ? ?Post-op Pain Management:   ? ?Induction:  ? ?PONV Risk Score and Plan: 4 or greater and Treatment may vary due to age or medical condition and Ondansetron ? ?Airway Management Planned: LMA ? ?Additional Equipment:  ? ?Intra-op Plan:  ? ?Post-operative Plan:  ? ?Informed Consent: I have reviewed the patients History and Physical, chart, labs and discussed the procedure including the risks, benefits and alternatives for the proposed anesthesia with the patient or authorized representative who has indicated his/her understanding and acceptance.  ? ?Patient has DNR.  ? ?Dental advisory given ? ?Plan Discussed with: CRNA and Anesthesiologist ? ?Anesthesia Plan Comments:   ? ? ? ? ? ?Anesthesia Quick Evaluation ? ?

## 2021-09-03 ENCOUNTER — Ambulatory Visit (HOSPITAL_COMMUNITY)
Admission: RE | Admit: 2021-09-03 | Discharge: 2021-09-03 | Disposition: A | Discharge status: Home / Self Care | Payer: 59 | Attending: Urology | Admitting: Urology

## 2021-09-03 ENCOUNTER — Ambulatory Visit (HOSPITAL_COMMUNITY): Payer: 59 | Admitting: Anesthesiology

## 2021-09-03 ENCOUNTER — Ambulatory Visit (HOSPITAL_BASED_OUTPATIENT_CLINIC_OR_DEPARTMENT_OTHER): Payer: 59 | Admitting: Anesthesiology

## 2021-09-03 ENCOUNTER — Encounter (HOSPITAL_COMMUNITY)
Admission: RE | Disposition: A | Discharge status: Home / Self Care | Payer: Self-pay | Source: Home / Self Care | Attending: Urology

## 2021-09-03 DIAGNOSIS — I10 Essential (primary) hypertension: Secondary | ICD-10-CM | POA: Insufficient documentation

## 2021-09-03 DIAGNOSIS — Z933 Colostomy status: Secondary | ICD-10-CM | POA: Insufficient documentation

## 2021-09-03 DIAGNOSIS — E1142 Type 2 diabetes mellitus with diabetic polyneuropathy: Secondary | ICD-10-CM | POA: Insufficient documentation

## 2021-09-03 DIAGNOSIS — E119 Type 2 diabetes mellitus without complications: Secondary | ICD-10-CM

## 2021-09-03 DIAGNOSIS — G8220 Paraplegia, unspecified: Secondary | ICD-10-CM | POA: Insufficient documentation

## 2021-09-03 DIAGNOSIS — N319 Neuromuscular dysfunction of bladder, unspecified: Secondary | ICD-10-CM

## 2021-09-03 DIAGNOSIS — G35 Multiple sclerosis: Secondary | ICD-10-CM | POA: Diagnosis not present

## 2021-09-03 DIAGNOSIS — M1990 Unspecified osteoarthritis, unspecified site: Secondary | ICD-10-CM | POA: Diagnosis not present

## 2021-09-03 DIAGNOSIS — Z01818 Encounter for other preprocedural examination: Secondary | ICD-10-CM

## 2021-09-03 DIAGNOSIS — E079 Disorder of thyroid, unspecified: Secondary | ICD-10-CM | POA: Insufficient documentation

## 2021-09-03 DIAGNOSIS — K219 Gastro-esophageal reflux disease without esophagitis: Secondary | ICD-10-CM | POA: Insufficient documentation

## 2021-09-03 HISTORY — PX: INSERTION OF SUPRAPUBIC CATHETER: SHX5870

## 2021-09-03 HISTORY — PX: BOTOX INJECTION: SHX5754

## 2021-09-03 LAB — CBC
HCT: 35.6 % — ABNORMAL LOW (ref 36.0–46.0)
Hemoglobin: 11.3 g/dL — ABNORMAL LOW (ref 12.0–15.0)
MCH: 26.2 pg (ref 26.0–34.0)
MCHC: 31.7 g/dL (ref 30.0–36.0)
MCV: 82.6 fL (ref 80.0–100.0)
Platelets: 376 10*3/uL (ref 150–400)
RBC: 4.31 MIL/uL (ref 3.87–5.11)
RDW: 19.1 % — ABNORMAL HIGH (ref 11.5–15.5)
WBC: 11.2 10*3/uL — ABNORMAL HIGH (ref 4.0–10.5)
nRBC: 0 % (ref 0.0–0.2)

## 2021-09-03 LAB — BASIC METABOLIC PANEL
Anion gap: 10 (ref 5–15)
BUN: 30 mg/dL — ABNORMAL HIGH (ref 8–23)
CO2: 30 mmol/L (ref 22–32)
Calcium: 9.4 mg/dL (ref 8.9–10.3)
Chloride: 97 mmol/L — ABNORMAL LOW (ref 98–111)
Creatinine, Ser: 0.59 mg/dL (ref 0.44–1.00)
GFR, Estimated: >60 mL/min (ref >60)
Glucose, Bld: 111 mg/dL — ABNORMAL HIGH (ref 70–99)
Potassium: 4 mmol/L (ref 3.5–5.1)
Sodium: 137 mmol/L (ref 135–145)

## 2021-09-03 LAB — GLUCOSE, CAPILLARY
Glucose-Capillary: 81 mg/dL (ref 70–99)
Glucose-Capillary: 56 mg/dL — ABNORMAL LOW (ref 70–99)
Glucose-Capillary: 65 mg/dL — ABNORMAL LOW (ref 70–99)
Glucose-Capillary: 74 mg/dL (ref 70–99)

## 2021-09-03 LAB — HEMOGLOBIN A1C
Hgb A1c MFr Bld: 4.9 % (ref 4.8–5.6)
Mean Plasma Glucose: 93.93 mg/dL

## 2021-09-03 SURGERY — BOTOX INJECTION
Anesthesia: General

## 2021-09-03 MED ORDER — HEPARIN SOD (PORK) LOCK FLUSH 100 UNIT/ML IV SOLN
INTRAVENOUS | Status: AC
  Filled 2021-09-03: qty 5

## 2021-09-03 MED ORDER — ACETAMINOPHEN 10 MG/ML IV SOLN: 1000.0000 mg | Freq: Once | INTRAVENOUS | Status: DC | PRN

## 2021-09-03 MED ORDER — FENTANYL CITRATE (PF) 100 MCG/2ML IJ SOLN
INTRAMUSCULAR | Status: DC | PRN
  Administered 2021-09-03: 25 ug via INTRAVENOUS

## 2021-09-03 MED ORDER — FENTANYL CITRATE PF 50 MCG/ML IJ SOSY: 25.0000 ug | PREFILLED_SYRINGE | INTRAMUSCULAR | Status: DC | PRN

## 2021-09-03 MED ORDER — SODIUM CHLORIDE (PF) 0.9 % IJ SOLN
INTRAMUSCULAR | Status: AC
  Filled 2021-09-03: qty 20

## 2021-09-03 MED ORDER — PROPOFOL 10 MG/ML IV BOLUS
INTRAVENOUS | Status: AC
  Filled 2021-09-03: qty 20

## 2021-09-03 MED ORDER — LIDOCAINE HCL (PF) 2 % IJ SOLN
INTRAMUSCULAR | Status: AC
  Filled 2021-09-03: qty 5

## 2021-09-03 MED ORDER — ONDANSETRON HCL 4 MG/2ML IJ SOLN
INTRAMUSCULAR | Status: AC
  Filled 2021-09-03: qty 2

## 2021-09-03 MED ORDER — FENTANYL CITRATE (PF) 100 MCG/2ML IJ SOLN
INTRAMUSCULAR | Status: AC
Start: 2021-09-03 — End: ?
  Filled 2021-09-03: qty 2

## 2021-09-03 MED ORDER — HEPARIN SOD (PORK) LOCK FLUSH 100 UNIT/ML IV SOLN
500.0000 [IU] | Freq: Once | INTRAVENOUS | Status: AC
Start: 2021-09-03 — End: 2021-09-03
  Administered 2021-09-03: 500 [IU] via INTRAVENOUS

## 2021-09-03 MED ORDER — LIDOCAINE HCL (CARDIAC) PF 100 MG/5ML IV SOSY
PREFILLED_SYRINGE | INTRAVENOUS | Status: DC | PRN
  Administered 2021-09-03: 50 mg via INTRAVENOUS

## 2021-09-03 MED ORDER — DEXAMETHASONE SODIUM PHOSPHATE 10 MG/ML IJ SOLN
INTRAMUSCULAR | Status: DC | PRN
  Administered 2021-09-03: 8 mg via INTRAVENOUS

## 2021-09-03 MED ORDER — CHLORHEXIDINE GLUCONATE 0.12 % MT SOLN: 15.0000 mL | Freq: Once | OROMUCOSAL | Status: AC

## 2021-09-03 MED ORDER — ONDANSETRON HCL 4 MG/2ML IJ SOLN: 4.0000 mg | Freq: Once | INTRAMUSCULAR | Status: DC | PRN

## 2021-09-03 MED ORDER — PROPOFOL 10 MG/ML IV BOLUS
INTRAVENOUS | Status: DC | PRN
  Administered 2021-09-03: 100 mg via INTRAVENOUS

## 2021-09-03 MED ORDER — DEXAMETHASONE SODIUM PHOSPHATE 10 MG/ML IJ SOLN
INTRAMUSCULAR | Status: AC
  Filled 2021-09-03: qty 1

## 2021-09-03 MED ORDER — ONABOTULINUMTOXINA 100 UNITS IJ SOLR
INTRAMUSCULAR | Status: AC
  Filled 2021-09-03: qty 200

## 2021-09-03 MED ORDER — ONDANSETRON HCL 4 MG/2ML IJ SOLN
INTRAMUSCULAR | Status: DC | PRN
  Administered 2021-09-03: 4 mg via INTRAVENOUS

## 2021-09-03 MED ORDER — LACTATED RINGERS IV SOLN: INTRAVENOUS | Status: DC

## 2021-09-03 MED ORDER — SODIUM CHLORIDE 0.9 % IV SOLN
1.0000 g | INTRAVENOUS | Status: AC
  Administered 2021-09-03: 1 g via INTRAVENOUS
  Filled 2021-09-03: qty 1

## 2021-09-03 MED ORDER — ONABOTULINUMTOXINA 100 UNITS IJ SOLR
200.0000 [IU] | Freq: Once | INTRAMUSCULAR | Status: AC
  Administered 2021-09-03: 200 [IU] via INTRAMUSCULAR

## 2021-09-03 MED ORDER — ORAL CARE MOUTH RINSE
15.0000 mL | Freq: Once | OROMUCOSAL | Status: AC
  Administered 2021-09-03: 15 mL via OROMUCOSAL

## 2021-09-03 MED ORDER — STERILE WATER FOR IRRIGATION IR SOLN
Status: DC | PRN
  Administered 2021-09-03: 3000 mL

## 2021-09-03 SURGICAL SUPPLY — 31 items
BAG DRN RND TRDRP ANRFLXCHMBR (UROLOGICAL SUPPLIES) ×1
BAG URINE DRAIN 2000ML AR STRL (UROLOGICAL SUPPLIES) ×2 IMPLANT
BAG URINE LEG 500ML (DRAIN) ×1 IMPLANT
BAG URO CATCHER STRL LF (MISCELLANEOUS) ×2 IMPLANT
BLADE SURG 15 STRL LF DISP TIS (BLADE) ×1 IMPLANT
BLADE SURG 15 STRL SS (BLADE)
CATH FOLEY 2WAY SLVR  5CC 18FR (CATHETERS) ×2
CATH FOLEY 2WAY SLVR  5CC 20FR (CATHETERS)
CATH FOLEY 2WAY SLVR 5CC 18FR (CATHETERS) IMPLANT
CATH FOLEY 2WAY SLVR 5CC 20FR (CATHETERS) ×1 IMPLANT
CATH URETL OPEN 5X70 (CATHETERS) IMPLANT
CLOTH BEACON ORANGE TIMEOUT ST (SAFETY) ×2 IMPLANT
ELECT REM PT RETURN 15FT ADLT (MISCELLANEOUS) ×1 IMPLANT
GLOVE SURG ENC MOIS LTX SZ7.5 (GLOVE) ×2 IMPLANT
GLOVE SURG ENC TEXT LTX SZ7.5 (GLOVE) ×2 IMPLANT
GOWN STRL REUS W/TWL XL LVL3 (GOWN DISPOSABLE) ×4 IMPLANT
KIT TURNOVER KIT A (KITS) IMPLANT
MANIFOLD NEPTUNE II (INSTRUMENTS) ×2 IMPLANT
NDL ASPIRATION 22 (NEEDLE) ×1 IMPLANT
NDL SAFETY ECLIPSE 18X1.5 (NEEDLE) IMPLANT
NEEDLE ASPIRATION 22 (NEEDLE) ×2 IMPLANT
NEEDLE HYPO 18GX1.5 SHARP (NEEDLE) ×2
NEEDLE HYPO 22GX1.5 SAFETY (NEEDLE) ×2 IMPLANT
PACK CYSTO (CUSTOM PROCEDURE TRAY) ×2 IMPLANT
PLUG CATH AND CAP STER (CATHETERS) ×1 IMPLANT
SPONGE DRAIN TRACH 4X4 STRL 2S (GAUZE/BANDAGES/DRESSINGS) ×1 IMPLANT
SUT ETHILON 2 0 PS N (SUTURE) ×1 IMPLANT
SYR CONTROL 10ML LL (SYRINGE) ×1 IMPLANT
TOWEL OR 17X26 10 PK STRL BLUE (TOWEL DISPOSABLE) ×2 IMPLANT
TUBING CONNECTING 10 (TUBING) ×1 IMPLANT
WATER STERILE IRR 3000ML UROMA (IV SOLUTION) ×2 IMPLANT

## 2021-09-03 NOTE — Transfer of Care (Signed)
Immediate Anesthesia Transfer of Care Note ? ?Patient: Laura Roberts ? ?Procedure(s) Performed: CYSTOSCOPY BOTOX INJECTION ?SUPRAPUBIC CATHETER EXCHANGE ? ?Patient Location: PACU ? ?Anesthesia Type:General ? ?Level of Consciousness: sedated ? ?Airway & Oxygen Therapy: Patient Spontanous Breathing and Patient connected to face mask oxygen ? ?Post-op Assessment: Report given to RN and Post -op Vital signs reviewed and stable ? ?Post vital signs: Reviewed and stable ? ?Last Vitals:  ?Vitals Value Taken Time  ?BP 132/71 09/03/21 1309  ?Temp    ?Pulse 89 09/03/21 1310  ?Resp 17 09/03/21 1310  ?SpO2 100 % 09/03/21 1310  ?Vitals shown include unvalidated device data. ? ?Last Pain:  ?Vitals:  ? 09/03/21 0920  ?TempSrc: Oral  ?   ? ?  ? ?Complications: No notable events documented. ?

## 2021-09-03 NOTE — Op Note (Signed)
Operative Note ? ?Preoperative diagnosis:  ?1.  Neurogenic bladder ? ?Postoperative diagnosis: ?1.  Neurogenic bladder ? ?Procedure(s): ?1.  Cystoscopy with intradetrusor Botox injection, 200 units ?2.  Simple suprapubic tube exchange ? ?Surgeon: Link Snuffer, MD ? ?Assistants: None ? ?Anesthesia: General ? ?Complications: None immediate ? ?EBL: Minimal ? ?Specimens: ?1.  None ? ?Drains/Catheters: ?1.  18 French suprapubic tube ? ?Intraoperative findings: 1.  Very patulous urethra.  Bladder mucosa with chronic edema.  No obvious stones or masses.  200 units of Botox instilled systematically ? ?Indication: 82 year old female with neurogenic bladder managed with Botox and suprapubic tube exchanges.  She presents for the previously mentioned operation. ? ?Description of procedure: ? ?The patient was identified and consent was obtained.  The patient was taken to the operating room and placed in the supine position.  The patient was placed under general anesthesia.  Perioperative antibiotics were administered.  The patient was placed in dorsal lithotomy.  Patient was prepped and draped in a standard sterile fashion and a timeout was performed. ? ?A 21 French rigid cystoscope was advanced into the urethra and into the bladder.  Complete cystoscopy was performed with findings noted above.  200 units of Botox was instilled into the detrusor muscle systematically throughout the bladder.  An 66 French Foley catheter was inserted into the suprapubic tube tract and into the bladder and 10 cc of sterile water was instilled into the catheter balloon.  This include the operation.  Patient tolerated the procedure well and was stable postoperative. ? ?Plan: Follow-up in about 6 months with repeat Botox.  Continue monthly suprapubic tube changes. ? ?

## 2021-09-03 NOTE — Anesthesia Procedure Notes (Signed)
Procedure Name: LMA Insertion ?Date/Time: 09/03/2021 12:35 PM ?Performed by: Lind Covert, CRNA ?Pre-anesthesia Checklist: Patient identified, Emergency Drugs available, Suction available and Patient being monitored ?Patient Re-evaluated:Patient Re-evaluated prior to induction ?Oxygen Delivery Method: Circle system utilized ?Preoxygenation: Pre-oxygenation with 100% oxygen ?Induction Type: IV induction ?LMA: LMA inserted ?LMA Size: 4.0 ?Tube type: Oral ?Number of attempts: 1 ?Placement Confirmation: positive ETCO2 and breath sounds checked- equal and bilateral ?Tube secured with: Tape ?Dental Injury: Teeth and Oropharynx as per pre-operative assessment  ? ? ? ? ?

## 2021-09-03 NOTE — Anesthesia Postprocedure Evaluation (Signed)
Anesthesia Post Note ? ?Patient: Laura Roberts ? ?Procedure(s) Performed: CYSTOSCOPY BOTOX INJECTION ?SUPRAPUBIC CATHETER EXCHANGE ? ?  ? ?Patient location during evaluation: PACU ?Anesthesia Type: General ?Level of consciousness: awake and alert ?Pain management: pain level controlled ?Vital Signs Assessment: post-procedure vital signs reviewed and stable ?Respiratory status: spontaneous breathing, nonlabored ventilation, respiratory function stable and patient connected to nasal cannula oxygen ?Cardiovascular status: blood pressure returned to baseline and stable ?Postop Assessment: no apparent nausea or vomiting ?Anesthetic complications: no ? ? ?No notable events documented. ? ?Last Vitals:  ?Vitals:  ? 09/03/21 1331 09/03/21 1350  ?BP: (!) 106/57 122/68  ?Pulse: 89 88  ?Resp: 15   ?Temp:    ?SpO2: 96% 99%  ?  ?Last Pain:  ?Vitals:  ? 09/03/21 1350  ?TempSrc:   ?PainSc: 0-No pain  ? ? ?  ?  ?  ?  ?  ?  ? ?[place] ? ? ? ? ?

## 2021-09-03 NOTE — H&P (Signed)
H&P  Chief Complaint: Neurogenic bladder  History of Present Illness: 81 year old female with a history of neurogenic bladder managed with suprapubic tube as well as intravesical Botox presents for Botox with suprapubic tube exchange.  She now has a colostomy to help prevent stool contamination of her sacral decubitus ulcer.  Past Medical History:  Diagnosis Date   Age related osteoporosis    Aphasia    Bronchitis    hx of    Cellulitis and abscess of toe    hx of    Closed supracondylar fracture of right femur, periprosthetic 06/01/2014   Constipation    Cystostomy in place [facility])    Decubitus ulcer    Degenerative arthritis    osteoarthritis    Depression    Diabetes mellitus without complication (HCC)    type 2    Edema    localized    Full incontinence of feces    GERD (gastroesophageal reflux disease)    Gout    hx of    History of falling    Hyperlipidemia    Hypertension    Hyperthyroidism    Multiple sclerosis (HCC)    Neurogenic bowel    Neuromuscular disorder (HCC)    Bilateral hand carpal tunnel syndrome   Neuromuscular dysfunction of bladder    Nontoxic multinodular goiter    Obesity    Osteomyelitis of coccyx ([place]) 04/05/2021   Osteoporosis    Other adrenocortical insufficiency (HCC)    Paraplegia (HCC)    LEGS   Personal history of urinary (tract) infections    Polyneuropathy    Pressure ulcer of right buttock, stage 4 (HCC)    Sacral osteomyelitis ([place]) 04/05/2021   Spinal stenosis in cervical region    Spinal stenosis of lumbosacral region    Spinal stenosis, thoracic    Tinea unguium    Vitamin D deficiency    Past Surgical History:  Procedure Laterality Date   ABDOMINAL HYSTERECTOMY     APPLICATION OF WOUND VAC  04/07/2021   Procedure: APPLICATION OF WOUND VAC;  Surgeon: Felicie Morn, MD;  Location: [facility];  Service: General;;   BACK SURGERY     BOTOX INJECTION N/A 01/30/2018   Procedure: CYSTOSCOPY BOTOX INJECTION, SUPRAPUBIC  EXCHANGE;  Surgeon: Lucas Mallow, MD;  Location: [facility];  Service: Urology;  Laterality: N/A;   BOTOX INJECTION N/A 09/02/2018   Procedure: BOTOX INJECTION;  Surgeon: Lucas Mallow, MD;  Location: [facility];  Service: Urology;  Laterality: N/A;   BOTOX INJECTION N/A 05/10/2019   Procedure: CYSTOSCOPY BOTOX INJECTION, SUPRAPUBIC EXCHANGE;  Surgeon: Lucas Mallow, MD;  Location: [facility];  Service: Urology;  Laterality: N/A;   BOTOX INJECTION N/A 09/06/2019   Procedure: CYSTOSCOPY BOTOX INJECTION;  Surgeon: Lucas Mallow, MD;  Location: [facility];  Service: Urology;  Laterality: N/A;   BOTOX INJECTION N/A 04/10/2020   Procedure: CYSTOSCOPY BOTOX INJECTION;  Surgeon: Festus Aloe, MD;  Location: [facility];  Service: Urology;  Laterality: N/A;   BOTOX INJECTION N/A 09/13/2020   Procedure: CYSTOSCOPYBOTOX INJECTION SUPRAPUBIC TUBE UPSIZE AND EXCHANGE;  Surgeon: Lucas Mallow, MD;  Location: [facility];  Service: Urology;  Laterality: N/A;   BOTOX INJECTION N/A 03/07/2021   Procedure: CYSTOSCOPY BOTOX INJECTION AND SUPRAPUBIC TUBE EXCHANGE;  Surgeon: Lucas Mallow, MD;  Location: [facility];  Service: Urology;  Laterality: N/A;  REQUESTING 53 MINS FOR CASE   CATARACT EXTRACTION Right    CHOLECYSTECTOMY  CYSTOSCOPY N/A 09/02/2018   Procedure: CYSTOSCOPY;  Surgeon: Lucas Mallow, MD;  Location: [facility];  Service: Urology;  Laterality: N/A;   CYSTOSTOMY N/A 09/02/2018   Procedure: CYSTOSTOMY SUPRAPUBIC;  Surgeon: Lucas Mallow, MD;  Location: [facility];  Service: Urology;  Laterality: N/A;  45 MINS   FEMUR IM NAIL Right 06/02/2014   Procedure: INTRAMEDULLARY (IM) RETROGRADE FEMORAL NAILING;  Surgeon: Johnny Bridge, MD;  Location: [facility];  Service: Orthopedics;  Laterality: Right;   GROIN DISSECTION Left 10/24/2017   Procedure: IRRIGATION AND DEBRIDEMENT, LEFT THIGH ABCESS ;  Surgeon: Alphonsa Overall, MD;  Location: [facility];  Service: General;  Laterality: Left;   INCISION AND DRAINAGE  PERIRECTAL ABSCESS N/A 04/07/2021   Procedure: DEBRIDEMENT OF SACRAL DECUBITUS ULCER;  Surgeon: Felicie Morn, MD;  Location: [facility];  Service: General;  Laterality: N/A;   INSERTION OF SUPRAPUBIC CATHETER N/A 09/06/2019   Procedure: SUPRAPUBIC TUBE CHANGE;  Surgeon: Lucas Mallow, MD;  Location: [facility];  Service: Urology;  Laterality: N/A;   INSERTION OF SUPRAPUBIC CATHETER N/A 04/10/2020   Procedure: SUPRAPUBIC CATHETER EXCHANGE/TRACT  DILATION;  Surgeon: Festus Aloe, MD;  Location: [facility];  Service: Urology;  Laterality: N/A;   IR CATHETER TUBE CHANGE  10/13/2017   IR CATHETER TUBE CHANGE  07/15/2018   IR CATHETER TUBE CHANGE  08/26/2019   IR CATHETER TUBE CHANGE  11/25/2019   IR CATHETER TUBE CHANGE  01/06/2020   IR CATHETER TUBE CHANGE  06/13/2020   IR CATHETER TUBE CHANGE  08/01/2020   LAPAROSCOPY N/A 04/07/2021   Procedure: LAPAROSCOPY DIAGNOSTIC; LAPAROSCOPIC CREATION OF TRANSVERSE COLOSTOMY;  Surgeon: Felicie Morn, MD;  Location: [facility];  Service: General;  Laterality: N/A;   left hand carpal tunnel surgery     REPLACEMENT TOTAL KNEE BILATERAL  2004    Home Medications:  Medications Prior to Admission  Medication Sig Dispense Refill Last Dose   acetaminophen (TYLENOL) 325 MG tablet Take 650 mg by mouth every 6 (six) hours as needed for mild pain or fever.       Amino Acids-Protein Hydrolys (FEEDING SUPPLEMENT, PRO-STAT SUGAR FREE 64,) LIQD Take 30 mLs by mouth 2 (two) times daily. (0900 & 1700)   09/02/2021   baclofen (LIORESAL) 10 MG tablet Take 10-15 mg by mouth See admin instructions. (0800 & 1200) Take 1.5 tablets (15 mg) by mouth in the morning & take 1 tablet (10 mg) by mouth at noon.   09/02/2021   baclofen (LIORESAL) 20 MG tablet Take 20 mg by mouth at bedtime. (2000)   09/02/2021   barrier cream (NON-SPECIFIED) CREA Apply 1 application topically 2 (two) times daily. (0700 & 1900) Apply to right ischium and bilateral buttocks   09/02/2021   bisacodyl (DULCOLAX) 10 MG  suppository Place 10 mg rectally daily as needed for moderate constipation (constipation not relieved by milk of magnesium).   09/02/2021   calcium citrate (CALCITRATE - DOSED IN MG ELEMENTAL CALCIUM) 950 (200 Ca) MG tablet Take 200 mg of elemental calcium by mouth daily. (0800)   09/02/2021   carboxymethylcellul-glycerin (OPTIVE) 0.5-0.9 % ophthalmic solution Place 1 drop into both eyes 4 (four) times daily as needed for dry eyes. (1324,4010, 1600 & 2000)   09/02/2021   cholecalciferol (VITAMIN D) 25 MCG (1000 UNIT) tablet Take 1,000 Units by mouth daily. (0800)   09/02/2021   cycloSPORINE (RESTASIS) 0.05 % ophthalmic emulsion Place 1 drop into both eyes at bedtime. (2000)   09/02/2021  Dulaglutide (TRULICITY) 3 SW/1.0XN SOPN Inject 3 mg into the skin every Tuesday. (1000)   09/02/2021   DULoxetine (CYMBALTA) 30 MG capsule Take 3 capsules (90 mg total) by mouth 2 (two) times daily. (0800)Give along with 30 mg to = 90 mg (Patient taking differently: Take 90 mg by mouth daily. (1000))   09/02/2021   feeding supplement, GLUCERNA SHAKE, (GLUCERNA SHAKE) LIQD Take 237 mLs by mouth 2 (two) times daily between meals. (Patient taking differently: Take 237 mLs by mouth in the morning, at noon, in the evening, and at bedtime.)  0 09/02/2021   ferrous sulfate 325 (65 FE) MG tablet Take 325 mg by mouth 2 (two) times daily with a meal. (0900 & 1700)   09/02/2021   furosemide (LASIX) 20 MG tablet Take 1 tablet (20 mg total) by mouth every other day. 30 tablet  09/02/2021   gabapentin (NEURONTIN) 100 MG capsule Take 200 mg by mouth 3 (three) times daily. (0800, 1400 & 2000)   09/02/2021   insulin glargine (LANTUS) 100 UNIT/ML Solostar Pen Inject 12 Units into the skin at bedtime. (2000)   09/02/2021   loperamide (IMODIUM) 2 MG capsule Take 4 mg by mouth daily as needed for diarrhea or loose stools (SUBSEQUENT DOSE GIVE ONE TAB AFTER EACH STOOL (max 8 mg/24 hrs.)).   09/02/2021   magnesium hydroxide (MILK OF MAGNESIA) 400 MG/5ML suspension  Take 30 mLs by mouth daily as needed for mild constipation.   09/02/2021   magnesium oxide (MAG-OX) 400 MG tablet Take 400 mg by mouth 2 (two) times daily. (1000 & 1700)      methimazole (TAPAZOLE) 5 MG tablet Take 1 tablet (5 mg total) by mouth daily. (Patient taking differently: Take 5 mg by mouth daily at 6 (six) AM. (0600)) 30 tablet 5 09/02/2021   metoprolol succinate (TOPROL-XL) 50 MG 24 hr tablet Take 50 mg by mouth daily. (0800)   09/02/2021   Multiple Vitamin (MULTIVITAMIN WITH MINERALS) TABS tablet Take 1 tablet by mouth in the morning. (0900)   09/02/2021   pantoprazole (PROTONIX) 40 MG tablet Take 40 mg by mouth daily at 6 (six) AM. (0600)   09/02/2021   polyethylene glycol (MIRALAX / GLYCOLAX) 17 g packet Take 17 g by mouth daily as needed. (Patient taking differently: Take 17 g by mouth daily. (1000)) 14 each 0 09/02/2021   predniSONE (DELTASONE) 2.5 MG tablet Take 2.5 mg by mouth every other day. (0800) Alternating days with 5 mg dose   Past Week   predniSONE (DELTASONE) 5 MG tablet Take 5 mg by mouth every other day. (0800) Alternating days with 2.5 mg dose   Past Week   SF 5000 PLUS 1.1 % CREA dental cream Take 1 application by mouth daily at 8 pm. (2000)   09/02/2021   Sodium Phosphates (RA SALINE ENEMA RE) Place 1 Bottle rectally daily as needed (severe constipation (not relieved by bisacodyl)).   09/02/2021   solifenacin (VESICARE) 10 MG tablet Take 10 mg by mouth daily. (0800)   09/02/2021   TECFIDERA 240 MG CPDR Take 1 capsule (240 mg total) by mouth 2 (two) times daily. (0800 & 2000) 60 capsule 11 09/02/2021   vitamin C (ASCORBIC ACID) 500 MG tablet Take 500 mg by mouth in the morning and at bedtime. (0900 & 1700)   09/02/2021   ondansetron (ZOFRAN) 4 MG tablet Take 4 mg by mouth every 6 (six) hours as needed for nausea or vomiting.   Unknown   oxyCODONE (OXY  IR/ROXICODONE) 5 MG immediate release tablet Take 1 tablet (5 mg total) by mouth every 6 (six) hours as needed for moderate pain. 10 tablet 0  Unknown   Allergies:  Allergies  Allergen Reactions   Codeine Shortness Of Breath and Other (See Comments)    Difficult breathing and skin problem   Ultram [Tramadol] Shortness Of Breath and Other (See Comments)    Difficult breathing and skin peeling   Januvia [Sitagliptin] Rash and Other (See Comments)    Blisters    Family History  Problem Relation Age of Onset   Multiple sclerosis Other        neices.    Cancer Mother    Diabetes Mother    GI Bleed Sister        diverticulitis   Thyroid disease Neg Hx    Social History:  reports that she has never smoked. She has never used smokeless tobacco. She reports that she does not drink alcohol and does not use drugs.  ROS: A complete review of systems was performed.  All systems are negative except for pertinent findings as noted. ROS   Physical Exam:  Vital signs in last 24 hours: Temp:  [97.8 F (36.6 C)] 97.8 F (36.6 C) (03/06 0920) Pulse Rate:  [80] 80 (03/06 0920) Resp:  [16] 16 (03/06 0920) BP: (148)/(81) 148/81 (03/06 0920) SpO2:  [96 %] 96 % (03/06 0920) Weight:  [72.6 kg] 72.6 kg (03/06 0920) General:  Alert and oriented, No acute distress HEENT: Normocephalic, atraumatic Neck: No JVD or lymphadenopathy Cardiovascular: Regular rate and rhythm Lungs: Regular rate and effort Abdomen: Soft, nontender, nondistended, no abdominal masses Back: No CVA tenderness Extremities: No edema Neurologic: Grossly intact  Laboratory Data:  Results for orders placed or performed during the hospital encounter of 09/03/21 (from the past 24 hour(s))  Glucose, capillary     Status: None   Collection Time: 09/03/21  9:24 AM  Result Value Ref Range   Glucose-Capillary 81 70 - 99 mg/dL  Basic metabolic panel per protocol     Status: Abnormal   Collection Time: 09/03/21  9:30 AM  Result Value Ref Range   Sodium 137 135 - 145 mmol/L   Potassium 4.0 3.5 - 5.1 mmol/L   Chloride 97 (L) 98 - 111 mmol/L   CO2 30 22 - 32 mmol/L    Glucose, Bld 111 (H) 70 - 99 mg/dL   BUN 30 (H) 8 - 23 mg/dL   Creatinine, Ser 0.59 0.44 - 1.00 mg/dL   Calcium 9.4 8.9 - 10.3 mg/dL   GFR, Estimated >60 >60 mL/min   Anion gap 10 5 - 15  CBC per protocol     Status: Abnormal   Collection Time: 09/03/21  9:30 AM  Result Value Ref Range   WBC 11.2 (H) 4.0 - 10.5 K/uL   RBC 4.31 3.87 - 5.11 MIL/uL   Hemoglobin 11.3 (L) 12.0 - 15.0 g/dL   HCT 35.6 (L) 36.0 - 46.0 %   MCV 82.6 80.0 - 100.0 fL   MCH 26.2 26.0 - 34.0 pg   MCHC 31.7 30.0 - 36.0 g/dL   RDW 19.1 (H) 11.5 - 15.5 %   Platelets 376 150 - 400 K/uL   nRBC 0.0 0.0 - 0.2 %   No results found for this or any previous visit (from the past 240 hour(s)). Creatinine: Recent Labs    09/03/21 0930  CREATININE 0.59    Impression/Assessment:  Neurogenic bladder  Plan:  Proceed with intradetrusor  Botox, 200 units with suprapubic tube exchange  Marton Redwood, III 09/03/2021, 10:37 AM

## 2021-09-04 ENCOUNTER — Encounter (HOSPITAL_COMMUNITY): Payer: Self-pay | Admitting: Urology

## 2021-09-10 ENCOUNTER — Ambulatory Visit: Payer: Medicaid Other | Admitting: Infectious Disease

## 2021-09-18 ENCOUNTER — Ambulatory Visit (INDEPENDENT_AMBULATORY_CARE_PROVIDER_SITE_OTHER): Payer: Commercial Managed Care - HMO | Admitting: Infectious Disease

## 2021-09-18 ENCOUNTER — Encounter: Payer: Self-pay | Admitting: Infectious Disease

## 2021-09-18 ENCOUNTER — Other Ambulatory Visit: Payer: Self-pay

## 2021-09-18 VITALS — BP 107/67 | HR 99 | Temp 98.4°F

## 2021-09-18 DIAGNOSIS — L89314 Pressure ulcer of right buttock, stage 4: Secondary | ICD-10-CM

## 2021-09-18 DIAGNOSIS — L89329 Pressure ulcer of left buttock, unspecified stage: Secondary | ICD-10-CM | POA: Diagnosis not present

## 2021-09-18 DIAGNOSIS — L0291 Cutaneous abscess, unspecified: Secondary | ICD-10-CM | POA: Diagnosis not present

## 2021-09-18 DIAGNOSIS — G35 Multiple sclerosis: Secondary | ICD-10-CM

## 2021-09-18 DIAGNOSIS — M4628 Osteomyelitis of vertebra, sacral and sacrococcygeal region: Secondary | ICD-10-CM | POA: Diagnosis not present

## 2021-09-18 DIAGNOSIS — M8660 Other chronic osteomyelitis, unspecified site: Secondary | ICD-10-CM | POA: Diagnosis not present

## 2021-09-18 HISTORY — DX: Pressure ulcer of left buttock, unspecified stage: L89.329

## 2021-09-18 NOTE — Progress Notes (Signed)
? ?Subjective:  ?Chief complaint:  ? ? Patient ID: Laura Roberts, female    DOB: 06-Jan-1941, 81 y.o.   MRN: [id number] ? ?HPI ? ?[name] is a very nice 81 year old black woman living with multiple sclerosis and paraplegia with chronic prednisone and chronically elevated white blood cell count who developed a deep stage IV decubitus ulcer with sacral osteomyelitis. ? ?She underwent debridement on April 07, 2021 with bone cultures identifying Proteus mirabilis and E. coli. ? ?She was seen by [place] on inpatient and prescribed 6 weeks of ertapenem which she has completed. ? ?She continues to have wound VAC in place and is followed by wound care at the facility that she resides in. ? ?I saw her in December when she was found to have leukocytosis but there was also great concern about wound worsening. ? ?MRI of the sacrum was pursued on December 28.  This showed a large decubitus ulcer with underlying acute on chronic osteomyelitis of the residual sacrum.  There is also a rim-enhancing fluid collection in the proximal hamstrings.  Right hip MRI was recommended and obtained in January the night that showed a deep right ischial ulcer with rim-enhancing fluid. ? ?Fortunately was no evidence of osteomyelitis of the ischial tuberosity at that time. ? ?The patient had been started on oral Bactrim on January 11. ? ?Ultimately PICC line was placed and plans were made for her to receive 6 weeks of ertapenem.  It appears she started ertapenem on July 13, 2021. ? ?She is on my partner Dr. Juleen China on January 31. ? ? ?At that point time she had a wound vacuum on her right hip. ? ?Plans were for her to be seen by surgery but when she had seen Dr. Juleen China she had not yet been evaluated by surgery. ? ?He had planned on her finishing 6 weeks of IV ertapenem which appears she has the PICC line still is in place though it seems for nearly a month? ? ?Tells me that the facility in the wound care staff of been happy with the progression of  her wounds. ? ? ?Past Medical History:  ?Diagnosis Date  ? Age related osteoporosis   ? Aphasia   ? Bronchitis   ? hx of   ? Cellulitis and abscess of toe   ? hx of   ? Closed supracondylar fracture of right femur, periprosthetic 06/01/2014  ? Constipation   ? Cystostomy in place [facility])   ? Decubitus ulcer   ? Degenerative arthritis   ? osteoarthritis   ? Depression   ? Diabetes mellitus without complication ([place])   ? type 2   ? Edema   ? localized   ? Full incontinence of feces   ? GERD (gastroesophageal reflux disease)   ? Gout   ? hx of   ? History of falling   ? Hyperlipidemia   ? Hypertension   ? Hyperthyroidism   ? Multiple sclerosis ([place])   ? Neurogenic bowel   ? Neuromuscular disorder ([place])   ? Bilateral hand carpal tunnel syndrome  ? Neuromuscular dysfunction of bladder   ? Nontoxic multinodular goiter   ? Obesity   ? Osteomyelitis of coccyx ([place]) 04/05/2021  ? Osteoporosis   ? Other adrenocortical insufficiency ([place])   ? Paraplegia ([place])   ? LEGS  ? Personal history of urinary (tract) infections   ? Polyneuropathy   ? Pressure ulcer of right buttock, stage 4 (HCC)   ? Sacral osteomyelitis ([place]) 04/05/2021  ? Spinal  stenosis in cervical region   ? Spinal stenosis of lumbosacral region   ? Spinal stenosis, thoracic   ? Tinea unguium   ? Vitamin D deficiency   ? ? ?Past Surgical History:  ?Procedure Laterality Date  ? ABDOMINAL HYSTERECTOMY    ? APPLICATION OF WOUND VAC  04/07/2021  ? Procedure: APPLICATION OF WOUND VAC;  Surgeon: Stechschulte, Nickola Major, MD;  [facility];  Service: General;;  ? BACK SURGERY    ? BOTOX INJECTION N/A 01/30/2018  ? Procedure: CYSTOSCOPY BOTOX INJECTION, SUPRAPUBIC EXCHANGE;  Surgeon: Lucas Mallow, MD;  [facility];  Service: Urology;  Laterality: N/A;  ? BOTOX INJECTION N/A 09/02/2018  ? Procedure: BOTOX INJECTION;  Surgeon: Lucas Mallow, MD;  [facility];  Service: Urology;  Laterality: N/A;  ? BOTOX INJECTION N/A 05/10/2019  ? Procedure: CYSTOSCOPY BOTOX  INJECTION, SUPRAPUBIC EXCHANGE;  Surgeon: Lucas Mallow, MD;  [facility];  Service: Urology;  Laterality: N/A;  ? BOTOX INJECTION N/A 09/06/2019  ? Procedure: CYSTOSCOPY BOTOX INJECTION;  Surgeon: Lucas Mallow, MD;  [facility];  Service: Urology;  Laterality: N/A;  ? BOTOX INJECTION N/A 04/10/2020  ? Procedure: CYSTOSCOPY BOTOX INJECTION;  Surgeon: Festus Aloe, MD;  [facility];  Service: Urology;  Laterality: N/A;  ? BOTOX INJECTION N/A 09/13/2020  ? Procedure: CYSTOSCOPYBOTOX INJECTION SUPRAPUBIC TUBE UPSIZE AND EXCHANGE;  Surgeon: Lucas Mallow, MD;  [facility];  Service: Urology;  Laterality: N/A;  ? BOTOX INJECTION N/A 03/07/2021  ? Procedure: CYSTOSCOPY BOTOX INJECTION AND SUPRAPUBIC TUBE EXCHANGE;  Surgeon: Lucas Mallow, MD;  [facility];  Service: Urology;  Laterality: N/A;  REQUESTING 56 MINS FOR CASE  ? BOTOX INJECTION N/A 09/03/2021  ? Procedure: CYSTOSCOPY BOTOX INJECTION;  Surgeon: Lucas Mallow, MD;  [facility];  Service: Urology;  Laterality: N/A;  ? CATARACT EXTRACTION Right   ? CHOLECYSTECTOMY    ? CYSTOSCOPY N/A 09/02/2018  ? Procedure: CYSTOSCOPY;  Surgeon: Lucas Mallow, MD;  [facility];  Service: Urology;  Laterality: N/A;  ? CYSTOSTOMY N/A 09/02/2018  ? Procedure: CYSTOSTOMY SUPRAPUBIC;  Surgeon: Lucas Mallow, MD;  [facility];  Service: Urology;  Laterality: N/A;  45 MINS  ? FEMUR IM NAIL Right 06/02/2014  ? Procedure: INTRAMEDULLARY (IM) RETROGRADE FEMORAL NAILING;  Surgeon: Johnny Bridge, MD;  Location: [place];  Service: Orthopedics;  Laterality: Right;  ? GROIN DISSECTION Left 10/24/2017  ? Procedure: IRRIGATION AND DEBRIDEMENT, LEFT THIGH ABCESS ;  Surgeon: Alphonsa Overall, MD;  [facility];  Service: General;  Laterality: Left;  ? INCISION AND DRAINAGE PERIRECTAL ABSCESS N/A 04/07/2021  ? Procedure: DEBRIDEMENT OF SACRAL DECUBITUS ULCER;  Surgeon: Felicie Morn, MD;  [facility];  Service: General;   Laterality: N/A;  ? INSERTION OF SUPRAPUBIC CATHETER N/A 09/06/2019  ? Procedure: SUPRAPUBIC TUBE CHANGE;  Surgeon: Lucas Mallow, MD;  [facility];  Service: Urology;  Laterality: N/A;  ? INSERTION OF SUPRAPUBIC CATHETER N/A 04/10/2020  ? Procedure: SUPRAPUBIC CATHETER EXCHANGE/TRACT  DILATION;  Surgeon: Festus Aloe, MD;  [facility];  Service: Urology;  Laterality: N/A;  ? INSERTION OF SUPRAPUBIC CATHETER N/A 09/03/2021  ? Procedure: SUPRAPUBIC CATHETER EXCHANGE;  Surgeon: Lucas Mallow, MD;  [facility];  Service: Urology;  Laterality: N/A;  [place] THIS CASE  ? IR CATHETER TUBE CHANGE  10/13/2017  ?  IR CATHETER TUBE CHANGE  07/15/2018  ? IR CATHETER TUBE CHANGE  08/26/2019  ? IR CATHETER TUBE CHANGE  11/25/2019  ? IR CATHETER TUBE CHANGE  01/06/2020  ? IR CATHETER TUBE CHANGE  06/13/2020  ? IR CATHETER TUBE CHANGE  08/01/2020  ? LAPAROSCOPY N/A 04/07/2021  ? Procedure: LAPAROSCOPY DIAGNOSTIC; LAPAROSCOPIC CREATION OF TRANSVERSE COLOSTOMY;  Surgeon: Felicie Morn, MD;  [facility];  Service: General;  Laterality: N/A;  ? left hand carpal tunnel surgery    ? REPLACEMENT TOTAL KNEE BILATERAL  2004  ? ? ?Family History  ?Problem Relation Age of Onset  ? Multiple sclerosis Other   ?     neices.   ? Cancer Mother   ? Diabetes Mother   ? GI Bleed Sister   ?     diverticulitis  ? Thyroid disease Neg Hx   ? ? ?  ?Social History  ? ?Socioeconomic History  ? Marital status: Widowed  ?  Spouse name: Not on file  ? Number of children: 2  ? Years of education: 5yrcolleg  ? Highest education level: Not on file  ?Occupational History  ? Occupation: N/A  ?  Employer: [employer]  ?  Comment: [employer]IGHT [employer]  ?Tobacco Use  ? Smoking status: Never  ? Smokeless tobacco: Never  ?Vaping Use  ? Vaping Use: Never used  ?Substance and Sexual Activity  ? Alcohol use: No  ? Drug use: No  ? Sexual activity: Never  ?  Birth control/protection: Surgical  ?Other Topics Concern  ? Not on file  ?Social  History Narrative  ? Patient is widowed with 2 children  ? Patient is right handed  ? Patient has 2 yrs of college  ? Patient drinks 2-3 cups of tea daily  ? ?Social Determinants of Health  ? ?Financial

## 2021-10-05 ENCOUNTER — Ambulatory Visit (HOSPITAL_COMMUNITY)
Admission: RE | Admit: 2021-10-05 | Discharge: 2021-10-05 | Disposition: A | Discharge status: Home / Self Care | Payer: 59 | Source: Ambulatory Visit | Attending: Infectious Disease | Admitting: Infectious Disease

## 2021-10-05 DIAGNOSIS — M8660 Other chronic osteomyelitis, unspecified site: Secondary | ICD-10-CM | POA: Diagnosis present

## 2021-10-05 MED ORDER — GADOBUTROL 1 MMOL/ML IV SOLN
7.0000 mL | Freq: Once | INTRAVENOUS | Status: AC | PRN
  Administered 2021-10-05: 7 mL via INTRAVENOUS

## 2021-10-16 ENCOUNTER — Telehealth: Payer: Self-pay

## 2021-10-16 ENCOUNTER — Encounter: Payer: Self-pay | Admitting: Infectious Disease

## 2021-10-16 ENCOUNTER — Ambulatory Visit (INDEPENDENT_AMBULATORY_CARE_PROVIDER_SITE_OTHER): Payer: Medicare Other | Admitting: Infectious Disease

## 2021-10-16 ENCOUNTER — Other Ambulatory Visit: Payer: Self-pay

## 2021-10-16 VITALS — BP 100/65 | HR 86 | Temp 98.6°F

## 2021-10-16 DIAGNOSIS — L0291 Cutaneous abscess, unspecified: Secondary | ICD-10-CM

## 2021-10-16 DIAGNOSIS — E1169 Type 2 diabetes mellitus with other specified complication: Secondary | ICD-10-CM | POA: Diagnosis not present

## 2021-10-16 DIAGNOSIS — G35 Multiple sclerosis: Secondary | ICD-10-CM | POA: Diagnosis not present

## 2021-10-16 DIAGNOSIS — M4628 Osteomyelitis of vertebra, sacral and sacrococcygeal region: Secondary | ICD-10-CM | POA: Diagnosis not present

## 2021-10-16 DIAGNOSIS — L89319 Pressure ulcer of right buttock, unspecified stage: Secondary | ICD-10-CM | POA: Diagnosis not present

## 2021-10-16 DIAGNOSIS — Z794 Long term (current) use of insulin: Secondary | ICD-10-CM

## 2021-10-16 NOTE — Telephone Encounter (Signed)
IR appointment not scheduled per radiologist review.  ? ?Beryle Flock, RN ? ?

## 2021-10-16 NOTE — Telephone Encounter (Signed)
[facility], requested that recent labs be faxed to triage.  ? ?Beryle Flock, RN ? ?

## 2021-10-16 NOTE — Progress Notes (Signed)
? ?Subjective:  ?Chief complaint:  ? ? Patient ID: Laura Roberts, female    DOB: May 16, 1941, 81 y.o.   MRN: [id number] ? ?HPI ? ?[name] is a very nice 81 year old black woman living with multiple sclerosis and paraplegia with chronic prednisone and chronically elevated white blood cell count who developed a deep stage IV decubitus ulcer with sacral osteomyelitis. ? ?She underwent debridement on April 07, 2021 with bone cultures identifying Proteus mirabilis and E. coli. ? ?She was seen by [place] on inpatient and prescribed 6 weeks of ertapenem which she has completed. ? ?She continues to have wound VAC in place and is followed by wound care at the facility that she resides in. ? ?I saw her in December when she was found to have leukocytosis but there was also great concern about wound worsening. ? ?MRI of the sacrum was pursued on December 28.  This showed a large decubitus ulcer with underlying acute on chronic osteomyelitis of the residual sacrum.  There is also a rim-enhancing fluid collection in the proximal hamstrings.  Right hip MRI was recommended and obtained in January the night that showed a deep right ischial ulcer with rim-enhancing fluid. ? ?Fortunately was no evidence of osteomyelitis of the ischial tuberosity at that time. ? ?The patient had been started on oral Bactrim on January 11. ? ?Ultimately PICC line was placed and plans were made for her to receive 6 weeks of ertapenem.  It appears she started ertapenem on July 13, 2021. ? ?She is on my partner Dr. Juleen China on January 31. ? ? ?At that point time she had a wound vacuum on her right hip. ? ?Plans were for her to be seen by surgery but when she had seen Dr. Juleen China she had not yet been evaluated by surgery. ? ?He had planned on her finishing 6 weeks of IV ertapenem which appears she has the PICC line still is in place though it seems for nearly a month? ? ?Tells me that the facility in the wound care staff of been happy with the progression of  her wounds. ? ?I ordered a repeat MRI of the right hip to reevaluate the fluid collection that was seen. ? ?MRI performed on October 05, 2021 indeed shows "unchanged deep right ischial ulcer with possible communicating rim-enhancing fluid. ? ?Curious part though as I do not observe on exam and obvious ulcer in the ischial area on exam. ? ?Her decubitus ulcer which I looked at again today seems to continue to improve in the sacral area. ? ?I am perplexed by the finding on imaging and wonder if radiology could get at this area through IR guided biopsy for culture. ? ?She is not having fevers chills nausea malaise or other systemic symptoms of infection ? ? ?Past Medical History:  ?Diagnosis Date  ? Age related osteoporosis   ? Aphasia   ? Bronchitis   ? hx of   ? Cellulitis and abscess of toe   ? hx of   ? Closed supracondylar fracture of right femur, periprosthetic 06/01/2014  ? Constipation   ? Cystostomy in place [facility])   ? Decubitus ulcer   ? Degenerative arthritis   ? osteoarthritis   ? Depression   ? Diabetes mellitus without complication ([place])   ? type 2   ? Edema   ? localized   ? Full incontinence of feces   ? GERD (gastroesophageal reflux disease)   ? Gout   ? hx of   ? History  of falling   ? Hyperlipidemia   ? Hypertension   ? Hyperthyroidism   ? Left perineal ischial pressure ulcer 09/18/2021  ? Multiple sclerosis ([place])   ? Neurogenic bowel   ? Neuromuscular disorder ([place])   ? Bilateral hand carpal tunnel syndrome  ? Neuromuscular dysfunction of bladder   ? Nontoxic multinodular goiter   ? Obesity   ? Osteomyelitis of coccyx ([place]) 04/05/2021  ? Osteoporosis   ? Other adrenocortical insufficiency ([place])   ? Paraplegia ([place])   ? LEGS  ? Personal history of urinary (tract) infections   ? Polyneuropathy   ? Pressure ulcer of right buttock, stage 4 (HCC)   ? Sacral osteomyelitis ([place]) 04/05/2021  ? Spinal stenosis in cervical region   ? Spinal stenosis of lumbosacral region   ? Spinal stenosis, thoracic   ? Tinea  unguium   ? Vitamin D deficiency   ? ? ?Past Surgical History:  ?Procedure Laterality Date  ? ABDOMINAL HYSTERECTOMY    ? APPLICATION OF WOUND VAC  04/07/2021  ? Procedure: APPLICATION OF WOUND VAC;  Surgeon: Stechschulte, Nickola Major, MD;  [facility];  Service: General;;  ? BACK SURGERY    ? BOTOX INJECTION N/A 01/30/2018  ? Procedure: CYSTOSCOPY BOTOX INJECTION, SUPRAPUBIC EXCHANGE;  Surgeon: Lucas Mallow, MD;  [facility];  Service: Urology;  Laterality: N/A;  ? BOTOX INJECTION N/A 09/02/2018  ? Procedure: BOTOX INJECTION;  Surgeon: Lucas Mallow, MD;  [facility];  Service: Urology;  Laterality: N/A;  ? BOTOX INJECTION N/A 05/10/2019  ? Procedure: CYSTOSCOPY BOTOX INJECTION, SUPRAPUBIC EXCHANGE;  Surgeon: Lucas Mallow, MD;  [facility];  Service: Urology;  Laterality: N/A;  ? BOTOX INJECTION N/A 09/06/2019  ? Procedure: CYSTOSCOPY BOTOX INJECTION;  Surgeon: Lucas Mallow, MD;  [facility];  Service: Urology;  Laterality: N/A;  ? BOTOX INJECTION N/A 04/10/2020  ? Procedure: CYSTOSCOPY BOTOX INJECTION;  Surgeon: Festus Aloe, MD;  [facility];  Service: Urology;  Laterality: N/A;  ? BOTOX INJECTION N/A 09/13/2020  ? Procedure: CYSTOSCOPYBOTOX INJECTION SUPRAPUBIC TUBE UPSIZE AND EXCHANGE;  Surgeon: Lucas Mallow, MD;  [facility];  Service: Urology;  Laterality: N/A;  ? BOTOX INJECTION N/A 03/07/2021  ? Procedure: CYSTOSCOPY BOTOX INJECTION AND SUPRAPUBIC TUBE EXCHANGE;  Surgeon: Lucas Mallow, MD;  [facility];  Service: Urology;  Laterality: N/A;  REQUESTING 45 MINS FOR CASE  ? BOTOX INJECTION N/A 09/03/2021  ? Procedure: CYSTOSCOPY BOTOX INJECTION;  Surgeon: Lucas Mallow, MD;  [facility];  Service: Urology;  Laterality: N/A;  ? CATARACT EXTRACTION Right   ? CHOLECYSTECTOMY    ? CYSTOSCOPY N/A 09/02/2018  ? Procedure: CYSTOSCOPY;  Surgeon: Lucas Mallow, MD;  [facility];  Service: Urology;  Laterality: N/A;  ? CYSTOSTOMY N/A  09/02/2018  ? Procedure: CYSTOSTOMY SUPRAPUBIC;  Surgeon: Lucas Mallow, MD;  [facility];  Service: Urology;  Laterality: N/A;  45 MINS  ? FEMUR IM NAIL Right 06/02/2014  ? Procedure: INTRAMEDULLARY (IM) RETROGRADE FEMORAL NAILING;  Surgeon: Johnny Bridge, MD;  Location: [place];  Service: Orthopedics;  Laterality: Right;  ? GROIN DISSECTION Left 10/24/2017  ? Procedure: IRRIGATION AND DEBRIDEMENT, LEFT THIGH ABCESS ;  Surgeon: Alphonsa Overall, MD;  [facility];  Service: General;  Laterality: Left;  ? INCISION AND DRAINAGE PERIRECTAL ABSCESS N/A 04/07/2021  ? Procedure: DEBRIDEMENT OF SACRAL DECUBITUS ULCER;  Surgeon:  Stechschulte, Nickola Major, MD;  [facility];  Service: General;  Laterality: N/A;  ? INSERTION OF SUPRAPUBIC CATHETER N/A 09/06/2019  ? Procedure: SUPRAPUBIC TUBE CHANGE;  Surgeon: Lucas Mallow, MD;  [facility];  Service: Urology;  Laterality: N/A;  ? INSERTION OF SUPRAPUBIC CATHETER N/A 04/10/2020  ? Procedure: SUPRAPUBIC CATHETER EXCHANGE/TRACT  DILATION;  Surgeon: Festus Aloe, MD;  [facility];  Service: Urology;  Laterality: N/A;  ? INSERTION OF SUPRAPUBIC CATHETER N/A 09/03/2021  ? Procedure: SUPRAPUBIC CATHETER EXCHANGE;  Surgeon: Lucas Mallow, MD;  [facility];  Service: Urology;  Laterality: N/A;  [place] THIS CASE  ? IR CATHETER TUBE CHANGE  10/13/2017  ? IR CATHETER TUBE CHANGE  07/15/2018  ? IR CATHETER TUBE CHANGE  08/26/2019  ? IR CATHETER TUBE CHANGE  11/25/2019  ? IR CATHETER TUBE CHANGE  01/06/2020  ? IR CATHETER TUBE CHANGE  06/13/2020  ? IR CATHETER TUBE CHANGE  08/01/2020  ? LAPAROSCOPY N/A 04/07/2021  ? Procedure: LAPAROSCOPY DIAGNOSTIC; LAPAROSCOPIC CREATION OF TRANSVERSE COLOSTOMY;  Surgeon: Felicie Morn, MD;  [facility];  Service: General;  Laterality: N/A;  ? left hand carpal tunnel surgery    ? REPLACEMENT TOTAL KNEE BILATERAL  2004  ? ? ?Family History  ?Problem Relation Age of Onset  ? Multiple sclerosis Other   ?     neices.    ? Cancer Mother   ? Diabetes Mother   ? GI Bleed Sister   ?     diverticulitis  ? Thyroid disease Neg Hx   ? ? ?  ?Social History  ? ?Socioeconomic History  ? Marital status: Widowed  ?  Spouse name: Not on fil

## 2021-10-16 NOTE — Telephone Encounter (Signed)
-----   Message from Truman Hayward, MD sent at 10/16/2021  1:38 PM EDT ----- ?Regarding: RE: Drain ?Ok very good that is mian thing I wanted to know. We'll see her again next month ?----- Message ----- ?From: Beryle Flock, RN ?Sent: 10/16/2021  12:23 PM EDT ?To: Truman Hayward, MD ?Subject: FW: Drain                                     ? ?Please see note from radiologist, thanks  ?----- Message ----- ?From: Gildardo Pounds D ?Sent: 10/16/2021  12:17 PM EDT ?To: Beryle Flock, RN ?Subject: FW: Drain                                     ? ? ?----- Message ----- ?From: Criselda Peaches, MD ?Sent: 10/16/2021  12:04 PM EDT ?To: Danielle Dess, Ir Procedure Requests ?Subject: RE: Drain                                     ? ?No drainable collection. ? ?HKM ? ? ?----- Message ----- ?From: Gildardo Pounds D ?Sent: 10/16/2021  12:01 PM EDT ?To: Ir Procedure Requests ?Subject: Drain                                         ? ?Procedure: Drainage of fluid collection ? ?Dx: ischial ulcer and abscess ? ?Ordering: Dr. Rhina Brackett Dam [phone] ? ?Imaging: MR Hip done 10/05/21 ? ?Please review.  ? ?Thanks,  ?Ash ? ? ? ? ? ? ? ?

## 2021-11-22 ENCOUNTER — Ambulatory Visit (INDEPENDENT_AMBULATORY_CARE_PROVIDER_SITE_OTHER): Payer: Medicare Other | Admitting: Infectious Disease

## 2021-11-22 ENCOUNTER — Encounter: Payer: Self-pay | Admitting: Infectious Disease

## 2021-11-22 ENCOUNTER — Other Ambulatory Visit: Payer: Self-pay

## 2021-11-22 VITALS — BP 112/74 | HR 88 | Temp 98.0°F

## 2021-11-22 DIAGNOSIS — Z794 Long term (current) use of insulin: Secondary | ICD-10-CM

## 2021-11-22 DIAGNOSIS — G35 Multiple sclerosis: Secondary | ICD-10-CM

## 2021-11-22 DIAGNOSIS — E1140 Type 2 diabetes mellitus with diabetic neuropathy, unspecified: Secondary | ICD-10-CM

## 2021-11-22 DIAGNOSIS — L89329 Pressure ulcer of left buttock, unspecified stage: Secondary | ICD-10-CM | POA: Diagnosis not present

## 2021-11-22 DIAGNOSIS — E1169 Type 2 diabetes mellitus with other specified complication: Secondary | ICD-10-CM

## 2021-11-22 DIAGNOSIS — M8660 Other chronic osteomyelitis, unspecified site: Secondary | ICD-10-CM

## 2021-11-22 DIAGNOSIS — M4628 Osteomyelitis of vertebra, sacral and sacrococcygeal region: Secondary | ICD-10-CM | POA: Diagnosis not present

## 2021-11-22 NOTE — Progress Notes (Signed)
Subjective:  Chief complaint: Follow-up for sacral decubitus ulcer   Patient ID: Laura Roberts, female    DOB: August 26, 1940, 81 y.o.   MRN: [id number]  HPI  [name] is a very nice 81 year-old black woman living with multiple sclerosis and paraplegia with chronic prednisone and chronically elevated white blood cell count who developed a deep stage IV decubitus ulcer with sacral osteomyelitis.  She underwent debridement on April 07, 2021 with bone cultures identifying Proteus mirabilis and E. coli.  She was seen by [place] on inpatient and prescribed 6 weeks of ertapenem which she has completed.  She continues to have wound VAC in place and is followed by wound care at the facility that she resides in.  I saw her in December when she was found to have leukocytosis but there was also great concern about wound worsening.  MRI of the sacrum was pursued on December 28.  This showed a large decubitus ulcer with underlying acute on chronic osteomyelitis of the residual sacrum.  There is also a rim-enhancing fluid collection in the proximal hamstrings.  Right hip MRI was recommended and obtained in January the night that showed a deep right ischial ulcer with rim-enhancing fluid.  Fortunately was no evidence of osteomyelitis of the ischial tuberosity at that time.  The patient had been started on oral Bactrim on January 11.  Ultimately PICC line was placed and plans were made for her to receive 6 weeks of ertapenem.  It appears she started ertapenem on July 13, 2021.  She is on my partner Dr. Juleen China on January 31.   At that point time she had a wound vacuum on her right hip.  Plans were for her to be seen by surgery but when she had seen Dr. Juleen China she had not yet been evaluated by surgery.  He had planned on her finishing 6 weeks of IV ertapenem which appears she has the PICC line still is in place though it seems for nearly a month?  Tells me that the facility in the wound care staff of  been happy with the progression of her wounds.  I ordered a repeat MRI of the right hip to reevaluate the fluid collection that was seen.  MRI performed on October 05, 2021 indeed shows "unchanged deep right ischial ulcer with possible communicating rim-enhancing fluid.  Curious part though as I do not observe on exam and obvious ulcer in the ischial area on exam.  Her decubitus ulcer which I looked at again today seems to continue to improve in the sacral area.  I am perplexed by the finding on imaging and wonder if radiology could get at this area through IR guided biopsy for culture.  I put in request for IR to drain this area but they returned report that after reviewing films they found no drainable fluid collection.  She has remained off antibiotics and she has had labs done at her skilled nursing facility but I do not have access to them now.  Apparently her wound vacuum fell out several days ago.  Her wound she told me was a quarter size but is still much larger than that.  There is more purulence in the wound today than when I last examined her but when I last examined her she was continuing to have the wound vacuum in place.  She does not have fevers chills nausea or other systemic symptoms.    Past Medical History:  Diagnosis Date   Age related osteoporosis  Aphasia    Bronchitis    hx of    Cellulitis and abscess of toe    hx of    Closed supracondylar fracture of right femur, periprosthetic 06/01/2014   Constipation    Cystostomy in place [facility])    Decubitus ulcer    Degenerative arthritis    osteoarthritis    Depression    Diabetes mellitus without complication (HCC)    type 2    Edema    localized    Full incontinence of feces    GERD (gastroesophageal reflux disease)    Gout    hx of    History of falling    Hyperlipidemia    Hypertension    Hyperthyroidism    Left perineal ischial pressure ulcer 09/18/2021   Multiple sclerosis (HCC)    Neurogenic bowel     Neuromuscular disorder (HCC)    Bilateral hand carpal tunnel syndrome   Neuromuscular dysfunction of bladder    Nontoxic multinodular goiter    Obesity    Osteomyelitis of coccyx ([place]) 04/05/2021   Osteoporosis    Other adrenocortical insufficiency (HCC)    Paraplegia (HCC)    LEGS   Personal history of urinary (tract) infections    Polyneuropathy    Pressure ulcer of right buttock, stage 4 (HCC)    Sacral osteomyelitis ([place]) 04/05/2021   Spinal stenosis in cervical region    Spinal stenosis of lumbosacral region    Spinal stenosis, thoracic    Tinea unguium    Vitamin D deficiency     Past Surgical History:  Procedure Laterality Date   ABDOMINAL HYSTERECTOMY     APPLICATION OF WOUND VAC  04/07/2021   Procedure: APPLICATION OF WOUND VAC;  Surgeon: Felicie Morn, MD;  Location: [facility];  Service: General;;   BACK SURGERY     BOTOX INJECTION N/A 01/30/2018   Procedure: CYSTOSCOPY BOTOX INJECTION, SUPRAPUBIC EXCHANGE;  Surgeon: Lucas Mallow, MD;  Location: [facility];  Service: Urology;  Laterality: N/A;   BOTOX INJECTION N/A 09/02/2018   Procedure: BOTOX INJECTION;  Surgeon: Lucas Mallow, MD;  Location: [facility];  Service: Urology;  Laterality: N/A;   BOTOX INJECTION N/A 05/10/2019   Procedure: CYSTOSCOPY BOTOX INJECTION, SUPRAPUBIC EXCHANGE;  Surgeon: Lucas Mallow, MD;  Location: [facility];  Service: Urology;  Laterality: N/A;   BOTOX INJECTION N/A 09/06/2019   Procedure: CYSTOSCOPY BOTOX INJECTION;  Surgeon: Lucas Mallow, MD;  Location: [facility];  Service: Urology;  Laterality: N/A;   BOTOX INJECTION N/A 04/10/2020   Procedure: CYSTOSCOPY BOTOX INJECTION;  Surgeon: Festus Aloe, MD;  Location: [facility];  Service: Urology;  Laterality: N/A;   BOTOX INJECTION N/A 09/13/2020   Procedure: CYSTOSCOPYBOTOX INJECTION SUPRAPUBIC TUBE UPSIZE AND EXCHANGE;  Surgeon: Lucas Mallow, MD;  [facility]: [facility];  Service: Urology;  Laterality: N/A;   BOTOX INJECTION N/A  03/07/2021   Procedure: CYSTOSCOPY BOTOX INJECTION AND SUPRAPUBIC TUBE EXCHANGE;  Surgeon: Lucas Mallow, MD;  Location: [facility];  Service: Urology;  Laterality: N/A;  REQUESTING 28 MINS FOR CASE   BOTOX INJECTION N/A 09/03/2021   Procedure: CYSTOSCOPY BOTOX INJECTION;  Surgeon: Lucas Mallow, MD;  Location: [facility];  Service: Urology;  Laterality: N/A;   CATARACT EXTRACTION Right    CHOLECYSTECTOMY     CYSTOSCOPY N/A 09/02/2018   Procedure: CYSTOSCOPY;  Surgeon: Lucas Mallow, MD;  Location: [facility];  Service: Urology;  Laterality: N/A;  CYSTOSTOMY N/A 09/02/2018   Procedure: CYSTOSTOMY SUPRAPUBIC;  Surgeon: Lucas Mallow, MD;  Location: [facility];  Service: Urology;  Laterality: N/A;  45 MINS   FEMUR IM NAIL Right 06/02/2014   Procedure: INTRAMEDULLARY (IM) RETROGRADE FEMORAL NAILING;  Surgeon: Johnny Bridge, MD;  Location: [place];  Service: Orthopedics;  Laterality: Right;   GROIN DISSECTION Left 10/24/2017   Procedure: IRRIGATION AND DEBRIDEMENT, LEFT THIGH ABCESS ;  Surgeon: Alphonsa Overall, MD;  Location: [facility];  Service: General;  Laterality: Left;   INCISION AND DRAINAGE PERIRECTAL ABSCESS N/A 04/07/2021   Procedure: DEBRIDEMENT OF SACRAL DECUBITUS ULCER;  Surgeon: Felicie Morn, MD;  [facility];  Service: General;  Laterality: N/A;   INSERTION OF SUPRAPUBIC CATHETER N/A 09/06/2019   Procedure: SUPRAPUBIC TUBE CHANGE;  Surgeon: Lucas Mallow, MD;  [facility];  Service: Urology;  Laterality: N/A;   INSERTION OF SUPRAPUBIC CATHETER N/A 04/10/2020   Procedure: SUPRAPUBIC CATHETER EXCHANGE/TRACT  DILATION;  Surgeon: Festus Aloe, MD;  [facility];  Service: Urology;  Laterality: N/A;   INSERTION OF SUPRAPUBIC CATHETER N/A 09/03/2021   Procedure: SUPRAPUBIC CATHETER EXCHANGE;  Surgeon: Lucas Mallow, MD;  [facility];  Service: Urology;  Laterality: N/A;  45 MINS FOR THIS CASE   IR CATHETER TUBE CHANGE  10/13/2017   IR CATHETER TUBE CHANGE   07/15/2018   IR CATHETER TUBE CHANGE  08/26/2019   IR CATHETER TUBE CHANGE  11/25/2019   IR CATHETER TUBE CHANGE  01/06/2020   IR CATHETER TUBE CHANGE  06/13/2020   IR CATHETER TUBE CHANGE  08/01/2020   LAPAROSCOPY N/A 04/07/2021   Procedure: LAPAROSCOPY DIAGNOSTIC; LAPAROSCOPIC CREATION OF TRANSVERSE COLOSTOMY;  Surgeon: Felicie Morn, MD;  Location: [facility];  Service: General;  Laterality: N/A;   left hand carpal tunnel surgery     REPLACEMENT TOTAL KNEE BILATERAL  2004    Family History  Problem Relation Age of Onset   Multiple sclerosis Other        neices.    Cancer Mother    Diabetes Mother    GI Bleed Sister        diverticulitis   Thyroid disease Neg Hx       Social History   Socioeconomic History   Marital status: Widowed    Spouse name: Not on file   Number of children: 2   Years of education: 54yrcolleg   Highest education level: Not on file  Occupational History   Occupation: N/A    Employer: [employer]    Comment: [employer]  Tobacco Use   Smoking status: Never   Smokeless tobacco: Never  Vaping Use   Vaping Use: Never used  Substance and Sexual Activity   Alcohol use: No   Drug use: No   Sexual activity: Never    Birth control/protection: Surgical  Other Topics Concern   Not on file  Social History Narrative   Patient is widowed with 2 children   Patient is right handed   Patient has 2 yrs of college   Patient drinks 2-3 cups of tea daily   Social Determinants of Health   Financial Resource Strain: Not on file  Food Insecurity: Not on file  Transportation Needs: Not on file  Physical Activity: Not on file  Stress: Not on file  Social Connections: Not on file    Allergies  Allergen Reactions   Codeine Shortness Of Breath and Other (See Comments)  Difficult breathing and skin problem   Ultram [Tramadol] Shortness Of Breath and Other (See Comments)    Difficult breathing and skin peeling   Januvia [Sitagliptin] Rash and  Other (See Comments)    Blisters     Current Outpatient Medications:    acetaminophen (TYLENOL) 325 MG tablet, Take 650 mg by mouth every 6 (six) hours as needed for mild pain or fever. , Disp: , Rfl:    Amino Acids-Protein Hydrolys (FEEDING SUPPLEMENT, PRO-STAT SUGAR FREE 64,) LIQD, Take 30 mLs by mouth 2 (two) times daily. (0900 & 1700), Disp: , Rfl:    baclofen (LIORESAL) 10 MG tablet, Take 10-15 mg by mouth See admin instructions. (0800 & 1200) Take 1.5 tablets (15 mg) by mouth in the morning & take 1 tablet (10 mg) by mouth at noon., Disp: , Rfl:    baclofen (LIORESAL) 20 MG tablet, Take 20 mg by mouth at bedtime. (2000), Disp: , Rfl:    barrier cream (NON-SPECIFIED) CREA, Apply 1 application topically 2 (two) times daily. (0700 & 1900) Apply to right ischium and bilateral buttocks, Disp: , Rfl:    bisacodyl (DULCOLAX) 10 MG suppository, Place 10 mg rectally daily as needed for moderate constipation (constipation not relieved by milk of magnesium)., Disp: , Rfl:    calcium citrate (CALCITRATE - DOSED IN MG ELEMENTAL CALCIUM) 950 (200 Ca) MG tablet, Take 200 mg of elemental calcium by mouth daily. (0800), Disp: , Rfl:    carboxymethylcellul-glycerin (OPTIVE) 0.5-0.9 % ophthalmic solution, Place 1 drop into both eyes 4 (four) times daily as needed for dry eyes. (0800,1200, 1600 & 2000), Disp: , Rfl:    cholecalciferol (VITAMIN D) 25 MCG (1000 UNIT) tablet, Take 1,000 Units by mouth daily. (0800), Disp: , Rfl:    cycloSPORINE (RESTASIS) 0.05 % ophthalmic emulsion, Place 1 drop into both eyes at bedtime. (2000), Disp: , Rfl:    Dulaglutide (TRULICITY) 3 OA/4.1YS SOPN, Inject 3 mg into the skin every Tuesday. (1000), Disp: , Rfl:    DULoxetine (CYMBALTA) 30 MG capsule, Take 3 capsules (90 mg total) by mouth 2 (two) times daily. (0800)Give along with 30 mg to = 90 mg (Patient taking differently: Take 90 mg by mouth daily. (1000)), Disp: , Rfl:    feeding supplement, GLUCERNA SHAKE, (GLUCERNA SHAKE)  LIQD, Take 237 mLs by mouth 2 (two) times daily between meals. (Patient taking differently: Take 237 mLs by mouth in the morning, at noon, in the evening, and at bedtime.), Disp: , Rfl: 0   ferrous sulfate 325 (65 FE) MG tablet, Take 325 mg by mouth 2 (two) times daily with a meal. (0900 & 1700), Disp: , Rfl:    furosemide (LASIX) 20 MG tablet, Take 1 tablet (20 mg total) by mouth every other day., Disp: 30 tablet, Rfl:    gabapentin (NEURONTIN) 100 MG capsule, Take 200 mg by mouth 3 (three) times daily. (0800, 1400 & 2000), Disp: , Rfl:    insulin glargine (LANTUS) 100 UNIT/ML Solostar Pen, Inject 12 Units into the skin at bedtime. (2000), Disp: , Rfl:    loperamide (IMODIUM) 2 MG capsule, Take 4 mg by mouth daily as needed for diarrhea or loose stools (SUBSEQUENT DOSE GIVE ONE TAB AFTER EACH STOOL (max 8 mg/24 hrs.))., Disp: , Rfl:    magnesium hydroxide (MILK OF MAGNESIA) 400 MG/5ML suspension, Take 30 mLs by mouth daily as needed for mild constipation., Disp: , Rfl:    magnesium oxide (MAG-OX) 400 MG tablet, Take 400 mg by mouth 2 (  two) times daily. (1000 & 1700), Disp: , Rfl:    methimazole (TAPAZOLE) 5 MG tablet, Take 1 tablet (5 mg total) by mouth daily. (Patient taking differently: Take 5 mg by mouth daily at 6 (six) AM. (0600)), Disp: 30 tablet, Rfl: 5   metoprolol succinate (TOPROL-XL) 50 MG 24 hr tablet, Take 50 mg by mouth daily. (0800), Disp: , Rfl:    Multiple Vitamin (MULTIVITAMIN WITH MINERALS) TABS tablet, Take 1 tablet by mouth in the morning. (0900), Disp: , Rfl:    ondansetron (ZOFRAN) 4 MG tablet, Take 4 mg by mouth every 6 (six) hours as needed for nausea or vomiting., Disp: , Rfl:    oxyCODONE (OXY IR/ROXICODONE) 5 MG immediate release tablet, Take 1 tablet (5 mg total) by mouth every 6 (six) hours as needed for moderate pain., Disp: 10 tablet, Rfl: 0   pantoprazole (PROTONIX) 40 MG tablet, Take 40 mg by mouth daily at 6 (six) AM. (0600), Disp: , Rfl:    polyethylene glycol  (MIRALAX / GLYCOLAX) 17 g packet, Take 17 g by mouth daily as needed. (Patient taking differently: Take 17 g by mouth daily. (1000)), Disp: 14 each, Rfl: 0   predniSONE (DELTASONE) 2.5 MG tablet, Take 2.5 mg by mouth every other day. (0800) Alternating days with 5 mg dose, Disp: , Rfl:    predniSONE (DELTASONE) 5 MG tablet, Take 5 mg by mouth every other day. (0800) Alternating days with 2.5 mg dose, Disp: , Rfl:    SF 5000 PLUS 1.1 % CREA dental cream, Take 1 application by mouth daily at 8 pm. (2000), Disp: , Rfl:    Sodium Phosphates (RA SALINE ENEMA RE), Place 1 Bottle rectally daily as needed (severe constipation (not relieved by bisacodyl))., Disp: , Rfl:    solifenacin (VESICARE) 10 MG tablet, Take 10 mg by mouth daily. (0800), Disp: , Rfl:    TECFIDERA 240 MG CPDR, Take 1 capsule (240 mg total) by mouth 2 (two) times daily. (0800 & 2000), Disp: 60 capsule, Rfl: 11   vitamin C (ASCORBIC ACID) 500 MG tablet, Take 500 mg by mouth in the morning and at bedtime. (0900 & 1700), Disp: , Rfl:     Review of Systems  Constitutional:  Negative for activity change, appetite change, chills, diaphoresis, fatigue, fever and unexpected weight change.  HENT:  Negative for congestion, rhinorrhea, sinus pressure, sneezing, sore throat and trouble swallowing.   Eyes:  Negative for photophobia and visual disturbance.  Respiratory:  Negative for cough, chest tightness, shortness of breath, wheezing and stridor.   Cardiovascular:  Negative for chest pain, palpitations and leg swelling.  Gastrointestinal:  Negative for abdominal distention, abdominal pain, anal bleeding, blood in stool, constipation, diarrhea, nausea and vomiting.  Genitourinary:  Negative for difficulty urinating, dysuria, flank pain and hematuria.  Musculoskeletal:  Negative for arthralgias, back pain, gait problem, joint swelling and myalgias.  Skin:  Positive for wound. Negative for color change, pallor and rash.  Neurological:  Positive  for weakness. Negative for dizziness, tremors and light-headedness.  Hematological:  Negative for adenopathy. Does not bruise/bleed easily.  Psychiatric/Behavioral:  Negative for agitation, behavioral problems, confusion, decreased concentration, dysphoric mood and sleep disturbance.       Objective:   Physical Exam Constitutional:      General: She is not in acute distress.    Appearance: Normal appearance. She is well-developed. She is not ill-appearing or diaphoretic.  HENT:     Head: Normocephalic and atraumatic.     Right Ear:  Hearing and external ear normal.     Left Ear: Hearing and external ear normal.     Nose: No nasal deformity or rhinorrhea.  Eyes:     General: No scleral icterus.    Conjunctiva/sclera: Conjunctivae normal.     Right eye: Right conjunctiva is not injected.     Left eye: Left conjunctiva is not injected.     Pupils: Pupils are equal, round, and reactive to light.  Neck:     Vascular: No JVD.  Cardiovascular:     Rate and Rhythm: Normal rate and regular rhythm.     Heart sounds: S1 normal and S2 normal.  Pulmonary:     Effort: Pulmonary effort is normal. No respiratory distress.     Breath sounds: No wheezing.  Abdominal:     General: There is no distension.     Palpations: Abdomen is soft.  Musculoskeletal:        General: Normal range of motion.     Right shoulder: Normal.     Left shoulder: Normal.     Cervical back: Normal range of motion and neck supple.     Right hip: Normal.     Left hip: Normal.     Right knee: Normal.     Left knee: Normal.  Lymphadenopathy:     Head:     Right side of head: No submandibular, preauricular or posterior auricular adenopathy.     Left side of head: No submandibular, preauricular or posterior auricular adenopathy.     Cervical: No cervical adenopathy.     Right cervical: No superficial or deep cervical adenopathy.    Left cervical: No superficial or deep cervical adenopathy.  Skin:    General: Skin is  warm and dry.     Coloration: Skin is not pale.     Findings: No abrasion, bruising, ecchymosis, erythema, lesion or rash.     Nails: There is no clubbing.  Neurological:     Mental Status: She is alert and oriented to person, place, and time.     Sensory: No sensory deficit.     Coordination: Coordination normal.     Gait: Gait normal.  Psychiatric:        Attention and Perception: She is attentive.        Mood and Affect: Mood normal.        Speech: Speech normal.        Behavior: Behavior normal. Behavior is cooperative.        Thought Content: Thought content normal.        Judgment: Judgment normal.     Sacral wound with wound vacuum taken down September 18, 2021:    With wound vacuum taken down entire posterior area visualized October 16, 2021:      Sacral decubitus ulcer visualized October 16, 2021:     Sacral wound Nov 22, 2021:        Assessment & Plan:  Polymicrobial osteomized of sacrum due to stage IV decubitus ulcer.  There is also mention of an area the right ischial area with ulcer communicating rim-enhancing fluid on MRI.  Radiology reviewed the the latter and found no evidence of fluid collection.  She does not have an ulcer present there either.  Sacral wound today has more purulence than the last time I saw her though she has been without her wound vacuum for several days.  She does not have systemic symptoms to suggest infection she has had labs drawn at the skilled  nursing facility which will be sent to [place].  For now we will hold on initiating antibiotics.  I will plan on seeing her in 2 months time.  Certainly should her condition worsens she can see me or another provider if I am out of town which I will be in portions of June and July.  He does continue to need to be turned frequently and continue with wound care diligently at skilled facility  MS with paraplegia:stable

## 2021-11-26 ENCOUNTER — Non-Acute Institutional Stay: Payer: Medicare Other | Admitting: Hospice

## 2021-11-26 DIAGNOSIS — Z515 Encounter for palliative care: Secondary | ICD-10-CM

## 2021-11-26 DIAGNOSIS — N319 Neuromuscular dysfunction of bladder, unspecified: Secondary | ICD-10-CM

## 2021-11-26 DIAGNOSIS — M4628 Osteomyelitis of vertebra, sacral and sacrococcygeal region: Secondary | ICD-10-CM

## 2021-11-26 DIAGNOSIS — G35 Multiple sclerosis: Secondary | ICD-10-CM

## 2021-11-26 NOTE — Progress Notes (Signed)
[facility] Consult Note Telephone: [phone]  Fax: [phone]  PATIENT NAME: Laura Roberts DOB: 29-Nov-1940 MRN: [id number]  PRIMARY CARE PROVIDER:   [name], MD [name], MD [address] [place],  [place] [address]  REFERRING PROVIDER: [name], MD [name], MD [address] [place],  [place] [address]  RESPONSIBLE PARTY:  Self Contact Information     Name Relation Home Work Mobile   Laura Roberts Son [phone]     [name], [name] [phone]  [phone]       Visit is to build trust and highlight Palliative Medicine as specialized medical care for people living with serious illness, aimed at facilitating better quality of life through symptoms relief, assisting with advance care planning and complex medical decision making. This is a follow up visit.  RECOMMENDATIONS/PLAN:   Code Status: Patient affirmed she is a Laura Roberts. Signed Laura Roberts in facility chart and in Laura Roberts.   Goals of Care: Goals of care include to maximize quality of life and symptom management. Laura Roberts form selections  include DO NOT RESUSCITATE, limited additional interventions, IV fluids as indicated, antibiotics as indicated, no feeding tube  Palliative care team will continue to support patient, patient's family, and medical team.  Symptom management/Plan:  Sacral Osteomyelitis: Followed by Infectious Disease. Wound VAC in place, wound care offloading. Repositioning encouraged.  Facility wound consultant and wound RN report wound improvement.  Wound offloading with diverting colostomy is ongoing.  Routine CBC ESR CRP CMP to monitor.  Multiple sclerosis: Managed with Tecfidera, Neurontin, baclofen. Continue with restorative exercises and ongoing occupational therapy.  Nonambulatory, Hoyer lift for all transfers.  Neurologist consult as planned. Neurogenic bladder: Suprapubic cath in place.  Followed by urologist.  Laura Roberts Botox  injections and Solifenacin for bladder spasm Follow up: Palliative care will continue to follow for complex medical decision making, advance care planning, and clarification of goals. Return 6 weeks or prn. Encouraged to call provider sooner with any concerns.  CHIEF COMPLAINT: Palliative follow up  HISTORY OF PRESENT ILLNESS:  Laura Roberts a 81 y.o. female with multiple morbidities requiring close monitoring/management with high risk of complications and morbidity: Sacral osteomyelitis, multiple sclerosis, muscle spasm, type 2 diabetes mellitus, depression, neurogenic bladder.Patient followed by Infectious Diseases. History of multiple sclerosis,  History obtained from review of Laura Roberts, discussion with primary team, family and/or patient. Records reviewed and summarized above. All 10 point systems reviewed and are negative except as documented in history of present illness above  Review and summarization of Laura Roberts records shows history from other than patient.   Palliative Care was asked to follow this patient o help address complex decision making in the context of advance care planning and goals of care clarification. I reviewed, as needed, available labs, patient records, imaging, studies and related documents from the Laura Roberts.    PHYSICAL EXAM  General: In no acute distress, cooperative Cardiovascular: regular rate and rhythm, no edema in BLE Pulmonary: no cough, no increased work of breathing, normal respiratory effort Abdomen: soft, non tender, positive bowel sounds in all quadrants, colostomy to LUQ GU:  no suprapubic tenderness; suprapubic Size 18 [place] in place, patient with light yellow urine Eyes: Normal lids, no discharge, sclera anicteric ENMT: Moist mucous membranes Musculoskeletal: bed bound, hoyer lift for transfers, wound vac applied to sacral wound Skin: no rash to visible skin, warm without cyanosis, dressing to sacral wound clean dry and intact Psych: coping Neurological:  Weakness  but otherwise non focal Heme/lymph/immuno: no bruises, no bleeding  PERTINENT MEDICATIONS:  Outpatient Encounter Medications as of 11/26/2021  Medication Sig   acetaminophen (TYLENOL) 325 MG tablet Take 650 mg by mouth every 6 (six) hours as needed for mild pain or fever.    Amino Acids-Protein Hydrolys (FEEDING SUPPLEMENT, PRO-STAT SUGAR FREE 64,) LIQD Take 30 mLs by mouth 2 (two) times daily. (0900 & 1700)   baclofen (LIORESAL) 10 MG tablet Take 10-15 mg by mouth See admin instructions. (0800 & 1200) Take 1.5 tablets (15 mg) by mouth in the morning & take 1 tablet (10 mg) by mouth at noon.   baclofen (LIORESAL) 20 MG tablet Take 20 mg by mouth at bedtime. (2000)   barrier cream (NON-SPECIFIED) CREA Apply 1 application topically 2 (two) times daily. (0700 & 1900) Apply to right ischium and bilateral buttocks   bisacodyl (DULCOLAX) 10 MG suppository Place 10 mg rectally daily as needed for moderate constipation (constipation not relieved by milk of magnesium).   calcium citrate (CALCITRATE - DOSED IN MG ELEMENTAL CALCIUM) 950 (200 Ca) MG tablet Take 200 mg of elemental calcium by mouth daily. (0800)   carboxymethylcellul-glycerin (OPTIVE) 0.5-0.9 % ophthalmic solution Place 1 drop into both eyes 4 (four) times daily as needed for dry eyes. (0800,1200, 1600 & 2000)   cholecalciferol (VITAMIN D) 25 MCG (1000 UNIT) tablet Take 1,000 Units by mouth daily. (0800)   cycloSPORINE (RESTASIS) 0.05 % ophthalmic emulsion Place 1 drop into both eyes at bedtime. (2000)   Dulaglutide (TRULICITY) 3 LX/7.2IO SOPN Inject 3 mg into the skin every Tuesday. (1000)   DULoxetine (CYMBALTA) 30 MG capsule Take 3 capsules (90 mg total) by mouth 2 (two) times daily. (0800)Give along with 30 mg to = 90 mg (Patient taking differently: Take 90 mg by mouth daily. (1000))   feeding supplement, GLUCERNA SHAKE, (GLUCERNA SHAKE) LIQD Take 237 mLs by mouth 2 (two) times daily between meals. (Patient taking differently: Take  237 mLs by mouth in the morning, at noon, in the evening, and at bedtime.)   ferrous sulfate 325 (65 FE) MG tablet Take 325 mg by mouth 2 (two) times daily with a meal. (0900 & 1700)   furosemide (LASIX) 20 MG tablet Take 1 tablet (20 mg total) by mouth every other day.   gabapentin (NEURONTIN) 100 MG capsule Take 200 mg by mouth 3 (three) times daily. (0800, 1400 & 2000)   insulin glargine (LANTUS) 100 UNIT/ML Solostar Pen Inject 12 Units into the skin at bedtime. (2000)   loperamide (IMODIUM) 2 MG capsule Take 4 mg by mouth daily as needed for diarrhea or loose stools (SUBSEQUENT DOSE GIVE ONE TAB AFTER EACH STOOL (max 8 mg/24 hrs.)).   magnesium hydroxide (MILK OF MAGNESIA) 400 MG/5ML suspension Take 30 mLs by mouth daily as needed for mild constipation.   magnesium oxide (MAG-OX) 400 MG tablet Take 400 mg by mouth 2 (two) times daily. (1000 & 1700)   methimazole (TAPAZOLE) 5 MG tablet Take 1 tablet (5 mg total) by mouth daily. (Patient taking differently: Take 5 mg by mouth daily at 6 (six) AM. (0600))   metoprolol succinate (TOPROL-XL) 50 MG 24 hr tablet Take 50 mg by mouth daily. (0800)   Multiple Vitamin (MULTIVITAMIN WITH MINERALS) TABS tablet Take 1 tablet by mouth in the morning. (0900)   ondansetron (ZOFRAN) 4 MG tablet Take 4 mg by mouth every 6 (six) hours as needed for nausea or vomiting.   oxyCODONE (OXY IR/ROXICODONE) 5 MG immediate  release tablet Take 1 tablet (5 mg total) by mouth every 6 (six) hours as needed for moderate pain.   pantoprazole (PROTONIX) 40 MG tablet Take 40 mg by mouth daily at 6 (six) AM. (0600)   polyethylene glycol (MIRALAX / GLYCOLAX) 17 g packet Take 17 g by mouth daily as needed. (Patient taking differently: Take 17 g by mouth daily. (1000))   predniSONE (DELTASONE) 2.5 MG tablet Take 2.5 mg by mouth every other day. (0800) Alternating days with 5 mg dose   predniSONE (DELTASONE) 5 MG tablet Take 5 mg by mouth every other day. (0800) Alternating days with 2.5  mg dose   SF 5000 PLUS 1.1 % CREA dental cream Take 1 application by mouth daily at 8 pm. (2000)   Sodium Phosphates (RA SALINE ENEMA RE) Place 1 Bottle rectally daily as needed (severe constipation (not relieved by bisacodyl)).   solifenacin (VESICARE) 10 MG tablet Take 10 mg by mouth daily. (0800)   TECFIDERA 240 MG CPDR Take 1 capsule (240 mg total) by mouth 2 (two) times daily. (0800 & 2000)   vitamin C (ASCORBIC ACID) 500 MG tablet Take 500 mg by mouth in the morning and at bedtime. (0900 & 1700)   No facility-administered encounter medications on file as of 11/26/2021.    HOSPICE ELIGIBILITY/DIAGNOSIS: TBD  PAST MEDICAL HISTORY:  Past Medical History:  Diagnosis Date   Age related osteoporosis    Aphasia    Bronchitis    hx of    Cellulitis and abscess of toe    hx of    Closed supracondylar fracture of right femur, periprosthetic 06/01/2014   Constipation    Cystostomy in place [facility])    Decubitus ulcer    Degenerative arthritis    osteoarthritis    Depression    Diabetes mellitus without complication (HCC)    type 2    Edema    localized    Full incontinence of feces    GERD (gastroesophageal reflux disease)    Gout    hx of    History of falling    Hyperlipidemia    Hypertension    Hyperthyroidism    Left perineal ischial pressure ulcer 09/18/2021   Multiple sclerosis (HCC)    Neurogenic bowel    Neuromuscular disorder (HCC)    Bilateral hand carpal tunnel syndrome   Neuromuscular dysfunction of bladder    Nontoxic multinodular goiter    Obesity    Osteomyelitis of coccyx ([place]) 04/05/2021   Osteoporosis    Other adrenocortical insufficiency (HCC)    Paraplegia (HCC)    LEGS   Personal history of urinary (tract) infections    Polyneuropathy    Pressure ulcer of right buttock, stage 4 (HCC)    Sacral osteomyelitis ([place]) 04/05/2021   Spinal stenosis in cervical region    Spinal stenosis of lumbosacral region    Spinal stenosis, thoracic    Tinea unguium     Vitamin D deficiency       ALLERGIES:  Allergies  Allergen Reactions   Codeine Shortness Of Breath and Other (See Comments)    Difficult breathing and skin problem   Ultram [Tramadol] Shortness Of Breath and Other (See Comments)    Difficult breathing and skin peeling   Januvia [Sitagliptin] Rash and Other (See Comments)    Blisters    I spent 45 minutes providing this consultation; time includes spent with patient/family, chart review and documentation. More than 50% of the time in this consultation was spent  on care coordination  Thank you for the opportunity to participate in the care of Laura Roberts Please call our office at [phone] if we can be of additional assistance.  Note: Portions of this note were generated with [employer]. Dictation errors may occur despite best attempts at proofreading.  Teodoro Spray, NP

## 2021-12-17 ENCOUNTER — Other Ambulatory Visit (HOSPITAL_COMMUNITY): Payer: Self-pay | Admitting: Urology

## 2021-12-17 ENCOUNTER — Other Ambulatory Visit (HOSPITAL_COMMUNITY): Payer: Self-pay | Admitting: General Practice

## 2021-12-17 ENCOUNTER — Ambulatory Visit (HOSPITAL_COMMUNITY)
Admission: RE | Admit: 2021-12-17 | Discharge: 2021-12-17 | Disposition: A | Discharge status: Home / Self Care | Payer: Commercial Managed Care - HMO | Source: Ambulatory Visit | Attending: Internal Medicine | Admitting: Internal Medicine

## 2021-12-17 ENCOUNTER — Other Ambulatory Visit: Payer: Self-pay | Admitting: Urology

## 2021-12-17 DIAGNOSIS — M4628 Osteomyelitis of vertebra, sacral and sacrococcygeal region: Secondary | ICD-10-CM

## 2021-12-17 DIAGNOSIS — N311 Reflex neuropathic bladder, not elsewhere classified: Secondary | ICD-10-CM

## 2021-12-17 MED ORDER — LIDOCAINE HCL 1 % IJ SOLN
INTRAMUSCULAR | Status: AC
  Filled 2021-12-17: qty 20

## 2021-12-17 MED ORDER — HEPARIN SOD (PORK) LOCK FLUSH 100 UNIT/ML IV SOLN
INTRAVENOUS | Status: AC
  Filled 2021-12-17: qty 5

## 2021-12-24 ENCOUNTER — Ambulatory Visit (HOSPITAL_COMMUNITY): Payer: Commercial Managed Care - HMO

## 2022-01-15 ENCOUNTER — Other Ambulatory Visit (HOSPITAL_COMMUNITY): Payer: Commercial Managed Care - HMO

## 2022-01-16 ENCOUNTER — Other Ambulatory Visit: Payer: Self-pay | Admitting: Student

## 2022-01-16 ENCOUNTER — Other Ambulatory Visit: Payer: Self-pay | Admitting: *Deleted

## 2022-01-16 DIAGNOSIS — G35 Multiple sclerosis: Secondary | ICD-10-CM

## 2022-01-16 DIAGNOSIS — N319 Neuromuscular dysfunction of bladder, unspecified: Secondary | ICD-10-CM

## 2022-01-16 MED ORDER — TECFIDERA 240 MG PO CPDR: 240.0000 mg | DELAYED_RELEASE_CAPSULE | Freq: Two times a day (BID) | ORAL | 2 refills | Status: DC

## 2022-01-17 ENCOUNTER — Encounter (HOSPITAL_COMMUNITY): Payer: Self-pay

## 2022-01-17 ENCOUNTER — Other Ambulatory Visit: Payer: Self-pay

## 2022-01-17 ENCOUNTER — Ambulatory Visit (HOSPITAL_COMMUNITY)
Admission: RE | Admit: 2022-01-17 | Discharge: 2022-01-17 | Disposition: A | Discharge status: Home / Self Care | Payer: Medicare Other | Source: Ambulatory Visit | Attending: Urology | Admitting: Urology

## 2022-01-17 DIAGNOSIS — N319 Neuromuscular dysfunction of bladder, unspecified: Secondary | ICD-10-CM | POA: Diagnosis present

## 2022-01-17 DIAGNOSIS — N311 Reflex neuropathic bladder, not elsewhere classified: Secondary | ICD-10-CM | POA: Insufficient documentation

## 2022-01-17 LAB — CBC
HCT: 40.9 % (ref 36.0–46.0)
Hemoglobin: 12.8 g/dL (ref 12.0–15.0)
MCH: 28.4 pg (ref 26.0–34.0)
MCHC: 31.3 g/dL (ref 30.0–36.0)
MCV: 90.7 fL (ref 80.0–100.0)
Platelets: 327 10*3/uL (ref 150–400)
RBC: 4.51 MIL/uL (ref 3.87–5.11)
RDW: 16.1 % — ABNORMAL HIGH (ref 11.5–15.5)
WBC: 10.1 10*3/uL (ref 4.0–10.5)
nRBC: 0 % (ref 0.0–0.2)

## 2022-01-17 LAB — GLUCOSE, CAPILLARY: Glucose-Capillary: 94 mg/dL (ref 70–99)

## 2022-01-17 LAB — PROTIME-INR
INR: 1 (ref 0.8–1.2)
Prothrombin Time: 12.9 seconds (ref 11.4–15.2)

## 2022-01-17 MED ORDER — MIDAZOLAM HCL 2 MG/2ML IJ SOLN
INTRAMUSCULAR | Status: AC
  Filled 2022-01-17: qty 4

## 2022-01-17 MED ORDER — FENTANYL CITRATE (PF) 100 MCG/2ML IJ SOLN
INTRAMUSCULAR | Status: AC
  Filled 2022-01-17: qty 2

## 2022-01-17 MED ORDER — FENTANYL CITRATE (PF) 100 MCG/2ML IJ SOLN
INTRAMUSCULAR | Status: AC | PRN
  Administered 2022-01-17 (×2): 50 ug via INTRAVENOUS

## 2022-01-17 MED ORDER — MIDAZOLAM HCL 2 MG/2ML IJ SOLN
INTRAMUSCULAR | Status: AC | PRN
  Administered 2022-01-17: 1 mg via INTRAVENOUS

## 2022-01-17 MED ORDER — SODIUM CHLORIDE 0.9 % IV SOLN: INTRAVENOUS | Status: DC

## 2022-01-17 MED ORDER — LIDOCAINE HCL (PF) 1 % IJ SOLN
INTRAMUSCULAR | Status: AC | PRN
  Administered 2022-01-17: 15 mL

## 2022-01-17 NOTE — Procedures (Signed)
Interventional Radiology Procedure Note  Procedure: CT 12 FR SP TUBE INSERTION  Complications: None  Estimated Blood Loss: MIN  Findings: CLEAR URINE  TSHICK, MD

## 2022-01-17 NOTE — Discharge Instructions (Addendum)
Please call Interventional Radiology clinic [phone] with any questions or concerns.  You may remove your dressing and shower tomorrow.   Nurse to order flush per facility specific policy.  Do not use scissors near the catheter  After flushes reconnect drainage tube to gravity drain  SP TUBE connected to GRAVITY BAG. Record output every shift.   Moderate Conscious Sedation, Adult, Care After This sheet gives you information about how to care for yourself after your procedure. Your health care provider may also give you more specific instructions. If you have problems or questions, contact your health careprovider. What can I expect after the procedure? After the procedure, it is common to have: Sleepiness for several hours. Impaired judgment for several hours. Difficulty with balance. Vomiting if you eat too soon. Follow these instructions at home: For the time period you were told by your health care provider: Rest. Do not participate in activities where you could fall or become injured. Do not drive or use machinery. Do not drink alcohol. Do not take sleeping pills or medicines that cause drowsiness. Do not make important decisions or sign legal documents. Do not take care of children on your own. Eating and drinking  Follow the diet recommended by your health care provider. Drink enough fluid to keep your urine pale yellow. If you vomit: Drink water, juice, or soup when you can drink without vomiting. Make sure you have little or no nausea before eating solid foods.  General instructions Take over-the-counter and prescription medicines only as told by your health care provider. Have a responsible adult stay with you for the time you are told. It is important to have someone help care for you until you are awake and alert. Do not smoke. Keep all follow-up visits as told by your health care provider. This is important. Contact a health care provider if: You are still  sleepy or having trouble with balance after 24 hours. You feel light-headed. You keep feeling nauseous or you keep vomiting. You develop a rash. You have a fever. You have redness or swelling around the IV site. Get help right away if: You have trouble breathing. You have new-onset confusion at home. Summary After the procedure, it is common to feel sleepy, have impaired judgment, or feel nauseous if you eat too soon. Rest after you get home. Know the things you should not do after the procedure. Follow the diet recommended by your health care provider and drink enough fluid to keep your urine pale yellow. Get help right away if you have trouble breathing or new-onset confusion at home. This information is not intended to replace advice given to you by your health care provider. Make sure you discuss any questions you have with your healthcare provider. Document Revised: 10/15/2019 Document Reviewed: 05/13/2019 Elsevier Patient Education  2022 [employer].

## 2022-01-17 NOTE — H&P (Signed)
Chief Complaint: Patient was seen in consultation today for No chief complaint on file.  at the request of Laura Roberts  Referring Physician(s): Marton Redwood Roberts  Supervising Physician: Daryll Brod  Patient Status: Laura Roberts - Out-pt  History of Present Illness: Laura Roberts is a 81 y.o. female with history of MS and neurogenic bladder.  She has previously had a suprapubic catheter that malfunctioned approximately 6 weeks ago.  She has been utilizing a foley catheter through the urethra ever since and the suprapubic catheter was removed.  She presents today for replacement of suprapubic catheter.  Overall she feels okay.  Denies HA, dizziness, fever/chills, CP, SOB, belly pain, change in bowels or urine.  She endorses a decubitus ulcer for which she is receiving treatment.  Past Medical History:  Diagnosis Date   Age related osteoporosis    Aphasia    Bronchitis    hx of    Cellulitis and abscess of toe    hx of    Closed supracondylar fracture of right femur, periprosthetic 06/01/2014   Constipation    Cystostomy in place [facility])    Decubitus ulcer    Degenerative arthritis    osteoarthritis    Depression    Diabetes mellitus without complication (HCC)    type 2    Edema    localized    Full incontinence of feces    GERD (gastroesophageal reflux disease)    Gout    hx of    History of falling    Hyperlipidemia    Hypertension    Hyperthyroidism    Left perineal ischial pressure ulcer 09/18/2021   Multiple sclerosis (HCC)    Neurogenic bowel    Neuromuscular disorder (HCC)    Bilateral hand carpal tunnel syndrome   Neuromuscular dysfunction of bladder    Nontoxic multinodular goiter    Obesity    Osteomyelitis of coccyx ([place]) 04/05/2021   Osteoporosis    Other adrenocortical insufficiency (HCC)    Paraplegia (HCC)    LEGS   Personal history of urinary (tract) infections    Polyneuropathy    Pressure ulcer of right buttock, stage 4 (HCC)    Sacral  osteomyelitis ([place]) 04/05/2021   Spinal stenosis in cervical region    Spinal stenosis of lumbosacral region    Spinal stenosis, thoracic    Tinea unguium    Vitamin D deficiency     Past Surgical History:  Procedure Laterality Date   ABDOMINAL HYSTERECTOMY     APPLICATION OF WOUND VAC  04/07/2021   Procedure: APPLICATION OF WOUND VAC;  Surgeon: Felicie Morn, MD;  Location: [facility];  Service: General;;   BACK SURGERY     BOTOX INJECTION N/A 01/30/2018   Procedure: CYSTOSCOPY BOTOX INJECTION, SUPRAPUBIC EXCHANGE;  Surgeon: Lucas Mallow, MD;  Location: [facility];  Service: Urology;  Laterality: N/A;   BOTOX INJECTION N/A 09/02/2018   Procedure: BOTOX INJECTION;  Surgeon: Lucas Mallow, MD;  Location: [facility];  Service: Urology;  Laterality: N/A;   BOTOX INJECTION N/A 05/10/2019   Procedure: CYSTOSCOPY BOTOX INJECTION, SUPRAPUBIC EXCHANGE;  Surgeon: Lucas Mallow, MD;  [facility]: [facility];  Service: Urology;  Laterality: N/A;   BOTOX INJECTION N/A 09/06/2019   Procedure: CYSTOSCOPY BOTOX INJECTION;  Surgeon: Lucas Mallow, MD;  [facility];  Service: Urology;  Laterality: N/A;   BOTOX INJECTION N/A 04/10/2020   Procedure: CYSTOSCOPY BOTOX INJECTION;  Surgeon: Festus Aloe, MD;  Location:  [facility];  Service: Urology;  Laterality: N/A;   BOTOX INJECTION N/A 09/13/2020   Procedure: CYSTOSCOPYBOTOX INJECTION SUPRAPUBIC TUBE UPSIZE AND EXCHANGE;  Surgeon: Lucas Mallow, MD;  [facility];  Service: Urology;  Laterality: N/A;   BOTOX INJECTION N/A 03/07/2021   Procedure: CYSTOSCOPY BOTOX INJECTION AND SUPRAPUBIC TUBE EXCHANGE;  Surgeon: Lucas Mallow, MD;  [facility]: [facility];  Service: Urology;  Laterality: N/A;  REQUESTING 13 MINS FOR CASE   BOTOX INJECTION N/A 09/03/2021   Procedure: CYSTOSCOPY BOTOX INJECTION;  Surgeon: Lucas Mallow, MD;  [facility];  Service: Urology;  Laterality: N/A;   CATARACT EXTRACTION Right    CHOLECYSTECTOMY      COLON SURGERY  2023   w/ colostomy   CYSTOSCOPY N/A 09/02/2018   Procedure: CYSTOSCOPY;  Surgeon: Lucas Mallow, MD;  [facility];  Service: Urology;  Laterality: N/A;   CYSTOSTOMY N/A 09/02/2018   Procedure: CYSTOSTOMY SUPRAPUBIC;  Surgeon: Lucas Mallow, MD;  [facility];  Service: Urology;  Laterality: N/A;  45 MINS   FEMUR IM NAIL Right 06/02/2014   Procedure: INTRAMEDULLARY (IM) RETROGRADE FEMORAL NAILING;  Surgeon: Johnny Bridge, MD;  Location: [place];  Service: Orthopedics;  Laterality: Right;   GROIN DISSECTION Left 10/24/2017   Procedure: IRRIGATION AND DEBRIDEMENT, LEFT THIGH ABCESS ;  Surgeon: Alphonsa Overall, MD;  Location[facility];  Service: General;  Laterality: Left;   INCISION AND DRAINAGE PERIRECTAL ABSCESS N/A 04/07/2021   Procedure: DEBRIDEMENT OF SACRAL DECUBITUS ULCER;  Surgeon: Felicie Morn, MD;  [facility];  Service: General;  Laterality: N/A;   INSERTION OF SUPRAPUBIC CATHETER N/A 09/06/2019   Procedure: SUPRAPUBIC TUBE CHANGE;  Surgeon: Lucas Mallow, MD;  [facility];  Service: Urology;  Laterality: N/A;   INSERTION OF SUPRAPUBIC CATHETER N/A 04/10/2020   Procedure: SUPRAPUBIC CATHETER EXCHANGE/TRACT  DILATION;  Surgeon: Festus Aloe, MD;  [facility];  Service: Urology;  Laterality: N/A;   INSERTION OF SUPRAPUBIC CATHETER N/A 09/03/2021   Procedure: SUPRAPUBIC CATHETER EXCHANGE;  Surgeon: Lucas Mallow, MD;  [facility];  Service: Urology;  Laterality: N/A;  45 MINS FOR THIS CASE   IR CATHETER TUBE CHANGE  10/13/2017   IR CATHETER TUBE CHANGE  07/15/2018   IR CATHETER TUBE CHANGE  08/26/2019   IR CATHETER TUBE CHANGE  11/25/2019   IR CATHETER TUBE CHANGE  01/06/2020   IR CATHETER TUBE CHANGE  06/13/2020   IR CATHETER TUBE CHANGE  08/01/2020   LAPAROSCOPY N/A 04/07/2021   Procedure: LAPAROSCOPY DIAGNOSTIC; LAPAROSCOPIC CREATION OF TRANSVERSE COLOSTOMY;  Surgeon: Felicie Morn, MD;  [facility];  Service: General;  Laterality: N/A;   left hand carpal tunnel surgery     REPLACEMENT TOTAL KNEE BILATERAL  07/01/2002    Allergies: Codeine, Ultram [tramadol], and Januvia [sitagliptin]  Medications: Prior to Admission medications   Medication Sig Start Date End Date Taking? Authorizing Provider  Amino Acids-Protein Hydrolys (FEEDING SUPPLEMENT, PRO-STAT SUGAR FREE 64,) LIQD Take 30 mLs by mouth 2 (two) times daily. (0900 & 1700)   Yes [provider]  baclofen (LIORESAL) 10 MG tablet Take 10-15 mg by mouth See admin instructions. (0800 & 1200) Take 1.5 tablets (15 mg) by mouth in the morning & take 1 tablet (10 mg) by mouth at noon.   Yes [provider]  baclofen (LIORESAL) 20 MG tablet Take 20 mg by mouth at bedtime. (2000)  Yes [provider]  barrier cream (NON-SPECIFIED) CREA Apply 1 application topically 2 (two) times daily. (0700 & 1900) Apply to right ischium and bilateral buttocks   Yes [provider]  bisacodyl (DULCOLAX) 10 MG suppository Place 10 mg rectally daily as needed for moderate constipation (constipation not relieved by milk of magnesium).   Yes [provider]  calcium citrate (CALCITRATE - DOSED IN MG ELEMENTAL CALCIUM) 950 (200 Ca) MG tablet Take 200 mg of elemental calcium by mouth daily. (0800)   Yes [provider]  carboxymethylcellul-glycerin (OPTIVE) 0.5-0.9 % ophthalmic solution Place 1 drop into both eyes 4 (four) times daily as needed for dry eyes. (0800,1200, 1600 & 2000)   Yes [provider]  cholecalciferol (VITAMIN D) 25 MCG (1000 UNIT) tablet Take 1,000 Units by mouth daily. (0800)   Yes [provider]  cycloSPORINE (RESTASIS) 0.05 % ophthalmic emulsion Place 1 drop into both eyes at bedtime. (2000)   Yes [provider]  Dulaglutide (TRULICITY) 3 MG/8.6PY SOPN Inject 3 mg into the skin every Tuesday. (1000)   Yes [provider]  DULoxetine (CYMBALTA) 30  MG capsule Take 3 capsules (90 mg total) by mouth 2 (two) times daily. (0800)Give along with 30 mg to = 90 mg Patient taking differently: Take 90 mg by mouth daily. (1000) 04/12/21  Yes Kathie Dike, MD  feeding supplement, GLUCERNA SHAKE, (GLUCERNA SHAKE) LIQD Take 237 mLs by mouth 2 (two) times daily between meals. Patient taking differently: Take 237 mLs by mouth in the morning, at noon, in the evening, and at bedtime. 04/12/21  Yes Kathie Dike, MD  ferrous sulfate 325 (65 FE) MG tablet Take 325 mg by mouth 2 (two) times daily with a meal. (0900 & 1700)   Yes [provider]  furosemide (LASIX) 20 MG tablet Take 1 tablet (20 mg total) by mouth every other day. 04/13/21  Yes Kathie Dike, MD  gabapentin (NEURONTIN) 100 MG capsule Take 200 mg by mouth 3 (three) times daily. (0800, 1400 & 2000)   Yes [provider]  insulin glargine (LANTUS) 100 UNIT/ML Solostar Pen Inject 12 Units into the skin at bedtime. (2000)   Yes [provider]  magnesium oxide (MAG-OX) 400 MG tablet Take 400 mg by mouth 2 (two) times daily. (1000 & 1700)   Yes [provider]  methimazole (TAPAZOLE) 5 MG tablet Take 1 tablet (5 mg total) by mouth daily. Patient taking differently: Take 5 mg by mouth daily at 6 (six) AM. (0600) 11/11/17  Yes Renato Shin, MD  metoprolol succinate (TOPROL-XL) 50 MG 24 hr tablet Take 50 mg by mouth daily. (0800)   Yes [provider]  Multiple Vitamin (MULTIVITAMIN WITH MINERALS) TABS tablet Take 1 tablet by mouth in the morning. (0900)   Yes [provider]  pantoprazole (PROTONIX) 40 MG tablet Take 40 mg by mouth daily at 6 (six) AM. (0600)   Yes [provider]  polyethylene glycol (MIRALAX / GLYCOLAX) 17 g packet Take 17 g by mouth daily as needed. Patient taking differently: Take 17 g by mouth daily. (1000) 04/12/21  Yes Kathie Dike, MD  predniSONE (DELTASONE) 2.5 MG tablet Take 2.5 mg by mouth every other day.  (0800) Alternating days with 5 mg dose   Yes [provider]  predniSONE (DELTASONE) 5 MG tablet Take 5 mg by mouth every other day. (0800) Alternating days with 2.5 mg dose   Yes [provider]  SF 5000 PLUS 1.1 %  CREA dental cream Take 1 application by mouth daily at 8 pm. (2000) 12/13/20  Yes [provider]  vitamin C (ASCORBIC ACID) 500 MG tablet Take 500 mg by mouth in the morning and at bedtime. (0900 & 1700)   Yes [provider]  acetaminophen (TYLENOL) 325 MG tablet Take 650 mg by mouth every 6 (six) hours as needed for mild pain or fever.     [provider]  loperamide (IMODIUM) 2 MG capsule Take 4 mg by mouth daily as needed for diarrhea or loose stools (SUBSEQUENT DOSE GIVE ONE TAB AFTER EACH STOOL (max 8 mg/24 hrs.)).    [provider]  magnesium hydroxide (MILK OF MAGNESIA) 400 MG/5ML suspension Take 30 mLs by mouth daily as needed for mild constipation.    [provider]  ondansetron (ZOFRAN) 4 MG tablet Take 4 mg by mouth every 6 (six) hours as needed for nausea or vomiting.    [provider]  oxyCODONE (OXY IR/ROXICODONE) 5 MG immediate release tablet Take 1 tablet (5 mg total) by mouth every 6 (six) hours as needed for moderate pain. 04/12/21   Kathie Dike, MD  Sodium Phosphates (RA SALINE ENEMA RE) Place 1 Bottle rectally daily as needed (severe constipation (not relieved by bisacodyl)).    [provider]  solifenacin (VESICARE) 10 MG tablet Take 10 mg by mouth daily. (0800)    [provider]  TECFIDERA 240 MG CPDR Take 1 capsule (240 mg total) by mouth 2 (two) times daily. (0800 & 2000) 01/16/22   Suzzanne Cloud, NP     Family History  Problem Relation Age of Onset   Multiple sclerosis Other        neices.    Cancer Mother    Diabetes Mother    GI Bleed Sister        diverticulitis   Thyroid disease Neg Hx     Social History   Socioeconomic History   Marital status:  Widowed    Spouse name: Not on file   Number of children: 2   Years of education: 21yrcolleg   Highest education level: Not on file  Occupational History   Occupation: N/A    Employer: [employer]   Comment: [employer]  Tobacco Use   Smoking status: Never   Smokeless tobacco: Never  Vaping Use   Vaping Use: Never used  Substance and Sexual Activity   Alcohol use: No   Drug use: No   Sexual activity: Never    Birth control/protection: Surgical  Other Topics Concern   Not on file  Social History Narrative   Patient is widowed with 2 children   Patient is right handed   Patient has 2 yrs of college   Patient drinks 2-3 cups of tea daily   Social Determinants of Health   Financial Resource Strain: Low Risk  (11/13/2017)   Overall Financial Resource Strain (CARDIA)    Difficulty of Paying Living Expenses: Not hard at all  Food Insecurity: No Food Insecurity (11/13/2017)   Hunger Vital Sign    Worried About Running Out of Food in the Last Year: Never true    RRatamosain the Last Year: Never true  Transportation Needs: No Transportation Needs (11/13/2017)   PRAPARE - THydrologist(Medical): No    Lack of Transportation (Non-Medical): No  Physical Activity: Sufficiently Active (11/13/2017)   Exercise Vital Sign    Days  of Exercise per Week: 7 days    Minutes of Exercise per Session: 30 min  Stress: Stress Concern Present (11/13/2017)   [place]    Feeling of Stress : Rather much  Social Connections: Moderately Isolated (11/13/2017)   Social Connection and Isolation Panel [NHANES]    Frequency of Communication with Friends and Family: More than three times a week    Frequency of Social Gatherings with Friends and Family: More than three times a week    Attends Religious Services: Never    Marine scientist or Organizations: No    Attends Archivist  Meetings: Never    Marital Status: Widowed    Review of Systems: A 12 point ROS discussed and pertinent positives are indicated in the HPI above.  All other systems are negative.  Vital Signs: BP 133/70   Pulse 84   Temp 97.7 F (36.5 C) (Oral)   Ht '5\' 2"'$  (1.575 m)   Wt 178 lb 9.6 oz (81 kg)   SpO2 97%   BMI 32.67 kg/m   Physical Exam Constitutional:      General: She is not in acute distress. HENT:     Mouth/Throat:     Mouth: Mucous membranes are moist.     Pharynx: Oropharynx is clear.  Eyes:     Extraocular Movements: Extraocular movements intact.     Conjunctiva/sclera: Conjunctivae normal.  Cardiovascular:     Rate and Rhythm: Normal rate and regular rhythm.     Pulses: Normal pulses.     Heart sounds: Normal heart sounds.  Pulmonary:     Effort: Pulmonary effort is normal.     Breath sounds: Normal breath sounds.  Abdominal:     Palpations: Abdomen is soft.     Comments: Colostomy in place; dressing over entrance site of prior SP tube  Skin:    General: Skin is warm and dry.  Neurological:     General: No focal deficit present.     Mental Status: She is alert and oriented to person, place, and time.  Psychiatric:        Mood and Affect: Mood normal.        Behavior: Behavior normal.     Imaging: No results found.  Labs:  CBC: Recent Labs    04/12/21 0625 04/13/21 0631 06/08/21 1057 09/03/21 0930  WBC 14.7* 15.4* 16.7* 11.2*  HGB 8.5* 9.0* 10.6* 11.3*  HCT 26.1* 27.9* 35.0 35.6*  PLT 475* 525* 485* 376    COAGS: Recent Labs    04/05/21 1439  INR 1.2    BMP: Recent Labs    04/11/21 0510 04/12/21 0625 04/13/21 0631 06/08/21 1057 09/03/21 0930  NA 141 142 136 139 137  K 3.7 3.6 3.6 4.2 4.0  CL 94* 93* 89* 98 97*  CO2 41* 41* 40* 29 30  GLUCOSE 84 84 115* 119* 111*  BUN '9 8 9 18 '$ 30*  CALCIUM 8.8* 8.9 8.4* 9.5 9.4  CREATININE 0.45 0.35* 0.61 0.56* 0.59  GFRNONAA >60 >60 >60  --  >60    LIVER FUNCTION TESTS: Recent Labs     06/08/21 1057  BILITOT 0.5  AST 7*  ALT 6  PROT 6.5    Assessment and Plan:  Neurogenic bladder --prior SP tube, presents for replacement with moderate sedation and planned discharge later today --Pending IV and labs --Pt is NPO and has transport back to facility  Risks and benefits discussed  with the patient including bleeding, infection, damage to adjacent structures, bowel perforation/fistula connection, and sepsis.  All of the patient's questions were answered, patient is agreeable to proceed. Consent signed and in chart.   Thank you for this interesting consult.  I greatly enjoyed meeting BRYLEA PITA and look forward to participating in their care.  A copy of this report was sent to the requesting provider on this date.  Electronically Signed: Pasty Spillers, PA 01/17/2022, 11:04 AM   I spent a total of 30 Minutes in face to face in clinical consultation, greater than 50% of which was counseling/coordinating care for neurogenic bladder.

## 2022-01-22 ENCOUNTER — Ambulatory Visit (INDEPENDENT_AMBULATORY_CARE_PROVIDER_SITE_OTHER): Payer: Commercial Managed Care - HMO | Admitting: Infectious Disease

## 2022-01-22 ENCOUNTER — Telehealth: Payer: Self-pay

## 2022-01-22 ENCOUNTER — Other Ambulatory Visit: Payer: Self-pay

## 2022-01-22 ENCOUNTER — Encounter: Payer: Self-pay | Admitting: Infectious Disease

## 2022-01-22 VITALS — BP 129/81 | HR 90 | Temp 97.8°F | Ht 62.0 in | Wt 180.6 lb

## 2022-01-22 DIAGNOSIS — L89006 Pressure-induced deep tissue damage of unspecified elbow: Secondary | ICD-10-CM | POA: Diagnosis not present

## 2022-01-22 DIAGNOSIS — M4628 Osteomyelitis of vertebra, sacral and sacrococcygeal region: Secondary | ICD-10-CM

## 2022-01-22 DIAGNOSIS — M8660 Other chronic osteomyelitis, unspecified site: Secondary | ICD-10-CM

## 2022-01-22 NOTE — Progress Notes (Signed)
Subjective:  Chief complaint:     Patient ID: Laura Roberts, female    DOB: 12/31/1940, 81 y.o.   MRN: [id number]  HPI  Laura Roberts is a very nice 81 year-old black woman living with multiple sclerosis and paraplegia with chronic prednisone and chronically elevated white blood cell count who developed a deep stage IV decubitus ulcer with sacral osteomyelitis.  She underwent debridement on April 07, 2021 with bone cultures identifying Proteus mirabilis and E. coli.  She was seen by [place] on inpatient and prescribed 6 weeks of ertapenem which she has completed.  She continues to have wound VAC in place and is followed by wound care at the facility that she resides in.  I saw her in December 2022 when she was found to have leukocytosis but there was also great concern about wound worsening.  MRI of the sacrum was pursued on December 28.  This showed a large decubitus ulcer with underlying acute on chronic osteomyelitis of the residual sacrum.  There is also a rim-enhancing fluid collection in the proximal hamstrings.  Right hip MRI was recommended and obtained in January the night that showed a deep right ischial ulcer with rim-enhancing fluid.  Fortunately was no evidence of osteomyelitis of the ischial tuberosity at that time.  The patient had been started on oral Bactrim on January 11.  Ultimately PICC line was placed and plans were made for her to receive 6 weeks of ertapenem.  It appears she started ertapenem on July 13, 2021.  She is on my partner Dr. Juleen China on January 31.   At that point time she had a wound vacuum on her right hip.  Plans were for her to be seen by surgery but when she had seen Dr. Juleen China she had not yet been evaluated by surgery.  He had planned on her finishing 6 weeks of IV ertapenem which appears she has the PICC line still is in place though it seems for nearly a month?  Tells me that the facility in the wound care staff of been happy with the  progression of her wounds.  I ordered a repeat MRI of the right hip to reevaluate the fluid collection that was seen.  MRI performed on October 05, 2021 indeed shows "unchanged deep right ischial ulcer with possible communicating rim-enhancing fluid.  Curious part though as I do not observe on exam and obvious ulcer in the ischial area on exam.  Her decubitus ulcer which I looked at again today seems to continue to improve in the sacral area.  I am perplexed by the finding on imaging and wonder if radiology could get at this area through IR guided biopsy for culture.  I put in request for IR to drain this area but they returned report that after reviewing films they found no drainable fluid collection.  She has remained off antibiotics and she has had labs done at her skilled nursing facility but I do not have access to them now.  Apparently her wound vacuum fell out several days ago.  Her wound she told me was a quarter size but is still much larger than that.  At my last visit with her she had more purulence in the wound but she had not been having the wound vacuum dressing on there prior to the appointment with [place].  She tells me it continues to improve and indeed on today's exam there is healthy granulation tissue and no purulence.     Past Medical History:  Diagnosis Date   Age related osteoporosis    Aphasia    Bronchitis    hx of    Cellulitis and abscess of toe    hx of    Closed supracondylar fracture of right femur, periprosthetic 06/01/2014   Constipation    Cystostomy in place [facility])    Decubitus ulcer    Degenerative arthritis    osteoarthritis    Depression    Diabetes mellitus without complication (HCC)    type 2    Edema    localized    Full incontinence of feces    GERD (gastroesophageal reflux disease)    Gout    hx of    History of falling    Hyperlipidemia    Hypertension    Hyperthyroidism    Left perineal ischial pressure ulcer 09/18/2021   Multiple  sclerosis (HCC)    Neurogenic bowel    Neuromuscular disorder (HCC)    Bilateral hand carpal tunnel syndrome   Neuromuscular dysfunction of bladder    Nontoxic multinodular goiter    Obesity    Osteomyelitis of coccyx ([place]) 04/05/2021   Osteoporosis    Other adrenocortical insufficiency (HCC)    Paraplegia (HCC)    LEGS   Personal history of urinary (tract) infections    Polyneuropathy    Pressure ulcer of right buttock, stage 4 (HCC)    Sacral osteomyelitis ([place]) 04/05/2021   Spinal stenosis in cervical region    Spinal stenosis of lumbosacral region    Spinal stenosis, thoracic    Tinea unguium    Vitamin D deficiency     Past Surgical History:  Procedure Laterality Date   ABDOMINAL HYSTERECTOMY     APPLICATION OF WOUND VAC  04/07/2021   Procedure: APPLICATION OF WOUND VAC;  Surgeon: Felicie Morn, MD;  Location: [facility];  Service: General;;   BACK SURGERY     BOTOX INJECTION N/A 01/30/2018   Procedure: CYSTOSCOPY BOTOX INJECTION, SUPRAPUBIC EXCHANGE;  Surgeon: Lucas Mallow, MD;  Location: [facility];  Service: Urology;  Laterality: N/A;   BOTOX INJECTION N/A 09/02/2018   Procedure: BOTOX INJECTION;  Surgeon: Lucas Mallow, MD;  Location: [facility];  Service: Urology;  Laterality: N/A;   BOTOX INJECTION N/A 05/10/2019   Procedure: CYSTOSCOPY BOTOX INJECTION, SUPRAPUBIC EXCHANGE;  Surgeon: Lucas Mallow, MD;  Location: [facility];  Service: Urology;  Laterality: N/A;   BOTOX INJECTION N/A 09/06/2019   Procedure: CYSTOSCOPY BOTOX INJECTION;  Surgeon: Lucas Mallow, MD;  Location: [facility];  Service: Urology;  Laterality: N/A;   BOTOX INJECTION N/A 04/10/2020   Procedure: CYSTOSCOPY BOTOX INJECTION;  Surgeon: Festus Aloe, MD;  Location: [facility];  Service: Urology;  Laterality: N/A;   BOTOX INJECTION N/A 09/13/2020   Procedure: CYSTOSCOPYBOTOX INJECTION SUPRAPUBIC TUBE UPSIZE AND EXCHANGE;  Surgeon: Lucas Mallow, MD;  [facility];  Service: Urology;   Laterality: N/A;   BOTOX INJECTION N/A 03/07/2021   Procedure: CYSTOSCOPY BOTOX INJECTION AND SUPRAPUBIC TUBE EXCHANGE;  Surgeon: Lucas Mallow, MD;  [facility]: [facility];  Service: Urology;  Laterality: N/A;  REQUESTING 3 MINS FOR CASE   BOTOX INJECTION N/A 09/03/2021   Procedure: CYSTOSCOPY BOTOX INJECTION;  Surgeon: Lucas Mallow, MD;  Location: [facility];  Service: Urology;  Laterality: N/A;   CATARACT EXTRACTION Right    CHOLECYSTECTOMY     COLON SURGERY  2023   w/ colostomy   CYSTOSCOPY N/A 09/02/2018  Procedure: CYSTOSCOPY;  Surgeon: Lucas Mallow, MD;  Location: [facility];  Service: Urology;  Laterality: N/A;   CYSTOSTOMY N/A 09/02/2018   Procedure: CYSTOSTOMY SUPRAPUBIC;  Surgeon: Lucas Mallow, MD;  Location: [facility];  Service: Urology;  Laterality: N/A;  45 MINS   FEMUR IM NAIL Right 06/02/2014   Procedure: INTRAMEDULLARY (IM) RETROGRADE FEMORAL NAILING;  Surgeon: Johnny Bridge, MD;  Location: [place];  Service: Orthopedics;  Laterality: Right;   GROIN DISSECTION Left 10/24/2017   Procedure: IRRIGATION AND DEBRIDEMENT, LEFT THIGH ABCESS ;  Surgeon: Alphonsa Overall, MD;  Location: [facility];  Service: General;  Laterality: Left;   INCISION AND DRAINAGE PERIRECTAL ABSCESS N/A 04/07/2021   Procedure: DEBRIDEMENT OF SACRAL DECUBITUS ULCER;  Surgeon: Felicie Morn, MD;  [facility];  Service: General;  Laterality: N/A;   INSERTION OF SUPRAPUBIC CATHETER N/A 09/06/2019   Procedure: SUPRAPUBIC TUBE CHANGE;  Surgeon: Lucas Mallow, MD;  [facility];  Service: Urology;  Laterality: N/A;   INSERTION OF SUPRAPUBIC CATHETER N/A 04/10/2020   Procedure: SUPRAPUBIC CATHETER EXCHANGE/TRACT  DILATION;  Surgeon: Festus Aloe, MD;  [facility];  Service: Urology;  Laterality: N/A;   INSERTION OF SUPRAPUBIC CATHETER N/A 09/03/2021   Procedure: SUPRAPUBIC CATHETER EXCHANGE;  Surgeon: Lucas Mallow, MD;  [facility];  Service: Urology;  Laterality: N/A;   45 MINS FOR THIS CASE   IR CATHETER TUBE CHANGE  10/13/2017   IR CATHETER TUBE CHANGE  07/15/2018   IR CATHETER TUBE CHANGE  08/26/2019   IR CATHETER TUBE CHANGE  11/25/2019   IR CATHETER TUBE CHANGE  01/06/2020   IR CATHETER TUBE CHANGE  06/13/2020   IR CATHETER TUBE CHANGE  08/01/2020   LAPAROSCOPY N/A 04/07/2021   Procedure: LAPAROSCOPY DIAGNOSTIC; LAPAROSCOPIC CREATION OF TRANSVERSE COLOSTOMY;  Surgeon: Felicie Morn, MD;  Location[facility];  Service: General;  Laterality: N/A;   left hand carpal tunnel surgery     REPLACEMENT TOTAL KNEE BILATERAL  07/01/2002    Family History  Problem Relation Age of Onset   Multiple sclerosis Other        neices.    Cancer Mother    Diabetes Mother    GI Bleed Sister        diverticulitis   Thyroid disease Neg Hx       Social History   Socioeconomic History   Marital status: Widowed    Spouse name: Not on file   Number of children: 2   Years of education: 94yrcolleg   Highest education level: Not on file  Occupational History   Occupation: N/A    Employer: [employer]   Comment: [employer]IDENT [employer]  Tobacco Use   Smoking status: Never   Smokeless tobacco: Never  Vaping Use   Vaping Use: Never used  Substance and Sexual Activity   Alcohol use: No   Drug use: No   Sexual activity: Never    Birth control/protection: Surgical  Other Topics Concern   Not on file  Social History Narrative   Patient is widowed with 2 children   Patient is right handed   Patient has 2 yrs of college   Patient drinks 2-3 cups of tea daily   Social Determinants of Health   Financial Resource Strain: Low Risk  (11/13/2017)   Overall Financial Resource Strain (CARDIA)    Difficulty of Paying Living Expenses: Not hard at all  Food Insecurity: No Food  Insecurity (11/13/2017)   Hunger Vital Sign    Worried About Running Out of Food in the Last Year: Never true    Ran Out of Food in the Last Year: Never true  Transportation Needs: No  Transportation Needs (11/13/2017)   PRAPARE - Hydrologist (Medical): No    Lack of Transportation (Non-Medical): No  Physical Activity: Sufficiently Active (11/13/2017)   Exercise Vital Sign    Days of Exercise per Week: 7 days    Minutes of Exercise per Session: 30 min  Stress: Stress Concern Present (11/13/2017)   [place]    Feeling of Stress : Rather much  Social Connections: Moderately Isolated (11/13/2017)   Social Connection and Isolation Panel [NHANES]    Frequency of Communication with Friends and Family: More than three times a week    Frequency of Social Gatherings with Friends and Family: More than three times a week    Attends Religious Services: Never    Marine scientist or Organizations: No    Attends Archivist Meetings: Never    Marital Status: Widowed    Allergies  Allergen Reactions   Codeine Shortness Of Breath and Other (See Comments)    Difficult breathing and skin problem   Ultram [Tramadol] Shortness Of Breath and Other (See Comments)    Difficult breathing and skin peeling   Januvia [Sitagliptin] Rash and Other (See Comments)    Blisters     Current Outpatient Medications:    acetaminophen (TYLENOL) 325 MG tablet, Take 650 mg by mouth every 6 (six) hours as needed for mild pain or fever. , Disp: , Rfl:    Amino Acids-Protein Hydrolys (FEEDING SUPPLEMENT, PRO-STAT SUGAR FREE 64,) LIQD, Take 30 mLs by mouth 2 (two) times daily. (0900 & 1700), Disp: , Rfl:    baclofen (LIORESAL) 10 MG tablet, Take 10-15 mg by mouth See admin instructions. (0800 & 1200) Take 1.5 tablets (15 mg) by mouth in the morning & take 1 tablet (10 mg) by mouth at noon., Disp: , Rfl:    baclofen (LIORESAL) 20 MG tablet, Take 20 mg by mouth at bedtime. (2000), Disp: , Rfl:    barrier cream (NON-SPECIFIED) CREA, Apply 1 application topically 2 (two) times daily. (0700 & 1900) Apply  to right ischium and bilateral buttocks, Disp: , Rfl:    bisacodyl (DULCOLAX) 10 MG suppository, Place 10 mg rectally daily as needed for moderate constipation (constipation not relieved by milk of magnesium)., Disp: , Rfl:    calcium citrate (CALCITRATE - DOSED IN MG ELEMENTAL CALCIUM) 950 (200 Ca) MG tablet, Take 200 mg of elemental calcium by mouth daily. (0800), Disp: , Rfl:    carboxymethylcellul-glycerin (OPTIVE) 0.5-0.9 % ophthalmic solution, Place 1 drop into both eyes 4 (four) times daily as needed for dry eyes. (0800,1200, 1600 & 2000), Disp: , Rfl:    cholecalciferol (VITAMIN D) 25 MCG (1000 UNIT) tablet, Take 1,000 Units by mouth daily. (0800), Disp: , Rfl:    cycloSPORINE (RESTASIS) 0.05 % ophthalmic emulsion, Place 1 drop into both eyes at bedtime. (2000), Disp: , Rfl:    Dulaglutide (TRULICITY) 3 YK/9.9IP SOPN, Inject 3 mg into the skin every Tuesday. (1000), Disp: , Rfl:    DULoxetine (CYMBALTA) 30 MG capsule, Take 3 capsules (90 mg total) by mouth 2 (two) times daily. (0800)Give along with 30 mg to = 90 mg (Patient taking differently: Take 90 mg by mouth daily. (1000)),  Disp: , Rfl:    feeding supplement, GLUCERNA SHAKE, (GLUCERNA SHAKE) LIQD, Take 237 mLs by mouth 2 (two) times daily between meals. (Patient taking differently: Take 237 mLs by mouth in the morning, at noon, in the evening, and at bedtime.), Disp: , Rfl: 0   ferrous sulfate 325 (65 FE) MG tablet, Take 325 mg by mouth 2 (two) times daily with a meal. (0900 & 1700), Disp: , Rfl:    furosemide (LASIX) 20 MG tablet, Take 1 tablet (20 mg total) by mouth every other day., Disp: 30 tablet, Rfl:    gabapentin (NEURONTIN) 100 MG capsule, Take 200 mg by mouth 3 (three) times daily. (0800, 1400 & 2000), Disp: , Rfl:    insulin glargine (LANTUS) 100 UNIT/ML Solostar Pen, Inject 12 Units into the skin at bedtime. (2000), Disp: , Rfl:    loperamide (IMODIUM) 2 MG capsule, Take 4 mg by mouth daily as needed for diarrhea or loose stools  (SUBSEQUENT DOSE GIVE ONE TAB AFTER EACH STOOL (max 8 mg/24 hrs.))., Disp: , Rfl:    magnesium hydroxide (MILK OF MAGNESIA) 400 MG/5ML suspension, Take 30 mLs by mouth daily as needed for mild constipation., Disp: , Rfl:    magnesium oxide (MAG-OX) 400 MG tablet, Take 400 mg by mouth 2 (two) times daily. (1000 & 1700), Disp: , Rfl:    methimazole (TAPAZOLE) 5 MG tablet, Take 1 tablet (5 mg total) by mouth daily. (Patient taking differently: Take 5 mg by mouth daily at 6 (six) AM. (0600)), Disp: 30 tablet, Rfl: 5   metoprolol succinate (TOPROL-XL) 50 MG 24 hr tablet, Take 50 mg by mouth daily. (0800), Disp: , Rfl:    Multiple Vitamin (MULTIVITAMIN WITH MINERALS) TABS tablet, Take 1 tablet by mouth in the morning. (0900), Disp: , Rfl:    ondansetron (ZOFRAN) 4 MG tablet, Take 4 mg by mouth every 6 (six) hours as needed for nausea or vomiting., Disp: , Rfl:    oxyCODONE (OXY IR/ROXICODONE) 5 MG immediate release tablet, Take 1 tablet (5 mg total) by mouth every 6 (six) hours as needed for moderate pain., Disp: 10 tablet, Rfl: 0   pantoprazole (PROTONIX) 40 MG tablet, Take 40 mg by mouth daily at 6 (six) AM. (0600), Disp: , Rfl:    polyethylene glycol (MIRALAX / GLYCOLAX) 17 g packet, Take 17 g by mouth daily as needed. (Patient taking differently: Take 17 g by mouth daily. (1000)), Disp: 14 each, Rfl: 0   predniSONE (DELTASONE) 2.5 MG tablet, Take 2.5 mg by mouth every other day. (0800) Alternating days with 5 mg dose, Disp: , Rfl:    predniSONE (DELTASONE) 5 MG tablet, Take 5 mg by mouth every other day. (0800) Alternating days with 2.5 mg dose, Disp: , Rfl:    SF 5000 PLUS 1.1 % CREA dental cream, Take 1 application by mouth daily at 8 pm. (2000), Disp: , Rfl:    Sodium Phosphates (RA SALINE ENEMA RE), Place 1 Bottle rectally daily as needed (severe constipation (not relieved by bisacodyl))., Disp: , Rfl:    solifenacin (VESICARE) 10 MG tablet, Take 10 mg by mouth daily. (0800), Disp: , Rfl:     TECFIDERA 240 MG CPDR, Take 1 capsule (240 mg total) by mouth 2 (two) times daily. (0800 & 2000), Disp: 60 capsule, Rfl: 2   vitamin C (ASCORBIC ACID) 500 MG tablet, Take 500 mg by mouth in the morning and at bedtime. (0900 & 1700), Disp: , Rfl:     Review of Systems  Constitutional:  Negative for activity change, appetite change, chills, diaphoresis, fatigue, fever and unexpected weight change.  HENT:  Negative for congestion, rhinorrhea, sinus pressure, sneezing, sore throat and trouble swallowing.   Eyes:  Negative for photophobia and visual disturbance.  Respiratory:  Negative for cough, chest tightness, shortness of breath, wheezing and stridor.   Cardiovascular:  Negative for chest pain, palpitations and leg swelling.  Gastrointestinal:  Negative for abdominal distention, abdominal pain, anal bleeding, blood in stool, constipation, diarrhea, nausea and vomiting.  Genitourinary:  Negative for difficulty urinating, dysuria, flank pain and hematuria.  Musculoskeletal:  Negative for arthralgias, back pain, gait problem, joint swelling and myalgias.  Skin:  Negative for color change, pallor, rash and wound.  Neurological:  Positive for weakness. Negative for dizziness, tremors, light-headedness and headaches.  Hematological:  Negative for adenopathy. Does not bruise/bleed easily.  Psychiatric/Behavioral:  Negative for agitation, behavioral problems, confusion, decreased concentration, dysphoric mood, sleep disturbance and suicidal ideas.        Objective:   Physical Exam   Sacral wound with wound vacuum taken down September 18, 2021:    With wound vacuum taken down entire posterior area visualized October 16, 2021:      Sacral decubitus ulcer visualized October 16, 2021:     Sacral wound Nov 22, 2021:     January 22, 2022:       Assessment & Plan:  Polymicrobial osteomized of sacrum due to stage IV decubitus ulcer.  There is also mention of an area the right ischial area with  ulcer communicating rim-enhancing fluid on MRI.  Radiology reviewed the the latter and found no evidence of fluid collection. This wound is resolved  Sacral wound t with healthy granulation tissue   MS with paraplegia:stable  I spent 36  minutes with the patient including than 50% of the time in face to face counseling of the patient  along with review of medical records in preparation for the visit and during the visit and in coordination of her care.

## 2022-01-22 NOTE — Telephone Encounter (Signed)
Called [facility] and spoke with [place], Therapist, sports. Communicated that visit note had been printed and included in packet transported back with patient. Per note, Dr. Tommy Medal stated wound was resolved and patient could follow up as needed. Made RN aware that wound vac dressing was removed in clinic to allow for wound assessment and would need to be replaced. [place] verbalized understanding and had no additional questions.  Binnie Kand, RN

## 2022-01-22 NOTE — Telephone Encounter (Signed)
Per Dr. Tommy Medal, called back [facility]. Spoke with [place], to request fax of patient's last set of labs. [facility] triage fax number provided. [place] stated she would fax them today.  Binnie Kand, RN

## 2022-01-30 ENCOUNTER — Ambulatory Visit: Payer: Commercial Managed Care - HMO | Admitting: Infectious Disease

## 2022-02-08 ENCOUNTER — Non-Acute Institutional Stay: Payer: Medicare Other | Admitting: Hospice

## 2022-02-08 DIAGNOSIS — G35 Multiple sclerosis: Secondary | ICD-10-CM

## 2022-02-08 DIAGNOSIS — M4628 Osteomyelitis of vertebra, sacral and sacrococcygeal region: Secondary | ICD-10-CM

## 2022-02-08 DIAGNOSIS — Z515 Encounter for palliative care: Secondary | ICD-10-CM

## 2022-02-08 DIAGNOSIS — N319 Neuromuscular dysfunction of bladder, unspecified: Secondary | ICD-10-CM

## 2022-02-08 NOTE — Progress Notes (Signed)
[facility] Palliative Care Consult Note Telephone: [phone]  Fax: [phone]  PATIENT NAME: Laura Roberts DOB: Jun 14, 1941 MRN: [id number]  PRIMARY CARE PROVIDER:   Dr Edrick Oh  REFERRING PROVIDER: Dr Gean Birchwood   RESPONSIBLE PARTY:  [place]     Name Relation Home Work Mobile   [place] Son   [phone]   [place], [place] [phone]  [phone]       Visit is to build trust and highlight Palliative Medicine as specialized medical care for people living with serious illness, aimed at facilitating better quality of life through symptoms relief, assisting with advance care planning and complex medical decision making. This is a follow up visit.  RECOMMENDATIONS/PLAN:   Code Status: Patient affirmed she is a DNR. Signed DNR in facility chart and in [place].   Goals of Care: Goals of care include to maximize quality of life and symptom management. MOST form selections  include DO NOT RESUSCITATE, limited additional interventions, IV fluids as indicated, antibiotics as indicated, no feeding tube  Palliative care team will continue to support patient, patient's family, and medical team.  Symptom management/Plan:  Sacral Osteomyelitis: Improved. Discharged by Infectious Disease. ID report in Epic 01/22/22 indicates wound with healthy granulation tissue. Wound VAC discontinued. Repositioning encouraged.  Facility wound consultant and wound RN report wound improvement.  Wound offloading with diverting colostomy is ongoing.  Routine CBC ESR CRP CMP to monitor. Resume with ID if needed.  Multiple sclerosis: Advanced,   Nonambulatory, Hoyer lift for all transfers.  Continue Tecfidera, Neurontin, baclofen. Continue with restorative exercises and ongoing occupational therapy. Neurologist consult as planned. Neurogenic bladder: Suprapubic cath in place.  Followed by urologist.  Marguerite Olea Botox injections and Solifenacin for bladder  spasm Follow up: Palliative care will continue to follow for complex medical decision making, advance care planning, and clarification of goals. Return 6 weeks or prn. Encouraged to call provider sooner with any concerns.  CHIEF COMPLAINT: Palliative follow up  HISTORY OF PRESENT ILLNESS:  [name] a 81 y.o. female with multiple morbidities requiring close monitoring/management with high risk of complications and morbidity: Sacral osteomyelitis, multiple sclerosis, muscle spasm, type 2 diabetes mellitus, depression, neurogenic bladder. Patient in no distress, reports overall doing better than before, with more energy. History obtained from review of EMR, discussion with primary team, family and/or patient. Records reviewed and summarized above. All 10 point systems reviewed and are negative except as documented in history of present illness above  Review and summarization of Epic records shows history from other than patient.   Palliative Care was asked to follow this patient o help address complex decision making in the context of advance care planning and goals of care clarification. I reviewed, as needed, available labs, patient records, imaging, studies and related documents from the EMR.    PERTINENT MEDICATIONS:  Outpatient Encounter Medications as of 02/08/2022  Medication Sig   acetaminophen (TYLENOL) 325 MG tablet Take 650 mg by mouth every 6 (six) hours as needed for mild pain or fever.    Amino Acids-Protein Hydrolys (FEEDING SUPPLEMENT, PRO-STAT SUGAR FREE 64,) LIQD Take 30 mLs by mouth 2 (two) times daily. (0900 & 1700)   baclofen (LIORESAL) 10 MG tablet Take 10-15 mg by mouth See admin instructions. (0800 & 1200) Take 1.5 tablets (15 mg) by mouth in the morning & take 1 tablet (10 mg) by mouth at noon.   baclofen (LIORESAL) 20 MG tablet Take 20 mg by mouth at  bedtime. (2000)   barrier cream (NON-SPECIFIED) CREA Apply 1 application topically 2 (two) times daily. (0700 & 1900)  Apply to right ischium and bilateral buttocks   bisacodyl (DULCOLAX) 10 MG suppository Place 10 mg rectally daily as needed for moderate constipation (constipation not relieved by milk of magnesium).   calcium citrate (CALCITRATE - DOSED IN MG ELEMENTAL CALCIUM) 950 (200 Ca) MG tablet Take 200 mg of elemental calcium by mouth daily. (0800)   Carboxymeth-Glycerin-Polysorb (REFRESH OPTIVE ADVANCED OP) Apply 1 drop to eye 4 (four) times daily as needed.   carboxymethylcellul-glycerin (OPTIVE) 0.5-0.9 % ophthalmic solution Place 1 drop into both eyes 4 (four) times daily as needed for dry eyes. (0800,1200, 1600 & 2000) (Patient not taking: Reported on 01/22/2022)   cholecalciferol (VITAMIN D) 25 MCG (1000 UNIT) tablet Take 1,000 Units by mouth daily. (0800)   cycloSPORINE (RESTASIS) 0.05 % ophthalmic emulsion Place 1 drop into both eyes at bedtime. (2000)   Dulaglutide (TRULICITY) 3 JQ/3.0SP SOPN Inject 3 mg into the skin every Tuesday. (1000)   DULoxetine (CYMBALTA) 30 MG capsule Take 3 capsules (90 mg total) by mouth 2 (two) times daily. (0800)Give along with 30 mg to = 90 mg (Patient taking differently: Take 90 mg by mouth daily. (1000))   feeding supplement, GLUCERNA SHAKE, (GLUCERNA SHAKE) LIQD Take 237 mLs by mouth 2 (two) times daily between meals. (Patient taking differently: Take 237 mLs by mouth daily.)   ferrous sulfate 325 (65 FE) MG tablet Take 325 mg by mouth 2 (two) times daily with a meal. (0900 & 1700)   furosemide (LASIX) 20 MG tablet Take 1 tablet (20 mg total) by mouth every other day.   gabapentin (NEURONTIN) 100 MG capsule Take 200 mg by mouth 3 (three) times daily. (0800, 1400 & 2000)   insulin glargine (LANTUS) 100 UNIT/ML Solostar Pen Inject 4 Units into the skin at bedtime. (2000)   loperamide (IMODIUM) 2 MG capsule Take 4 mg by mouth daily as needed for diarrhea or loose stools (SUBSEQUENT DOSE GIVE ONE TAB AFTER EACH STOOL (max 8 mg/24 hrs.)).   magnesium hydroxide (MILK OF  MAGNESIA) 400 MG/5ML suspension Take 30 mLs by mouth daily as needed for mild constipation.   magnesium oxide (MAG-OX) 400 MG tablet Take 400 mg by mouth 2 (two) times daily. (1000 & 1700)   methimazole (TAPAZOLE) 5 MG tablet Take 1 tablet (5 mg total) by mouth daily. (Patient taking differently: Take 5 mg by mouth daily at 6 (six) AM. (0600))   metoprolol succinate (TOPROL-XL) 50 MG 24 hr tablet Take 50 mg by mouth daily. (0800)   Multiple Vitamin (MULTIVITAMIN WITH MINERALS) TABS tablet Take 1 tablet by mouth in the morning. (0900)   ondansetron (ZOFRAN) 4 MG tablet Take 4 mg by mouth every 6 (six) hours as needed for nausea or vomiting.   oxyCODONE (OXY IR/ROXICODONE) 5 MG immediate release tablet Take 1 tablet (5 mg total) by mouth every 6 (six) hours as needed for moderate pain. (Patient not taking: Reported on 01/22/2022)   pantoprazole (PROTONIX) 40 MG tablet Take 40 mg by mouth daily at 6 (six) AM. (0600)   polyethylene glycol (MIRALAX / GLYCOLAX) 17 g packet Take 17 g by mouth daily as needed. (Patient taking differently: Take 17 g by mouth daily. (1000))   predniSONE (DELTASONE) 2.5 MG tablet Take 2.5 mg by mouth every other day. (0800) Alternating days with 5 mg dose   predniSONE (DELTASONE) 5 MG tablet Take 5 mg by mouth every  other day. (0800) Alternating days with 2.5 mg dose   SF 5000 PLUS 1.1 % CREA dental cream Take 1 application by mouth daily at 8 pm. (2000)   Sodium Phosphates (RA SALINE ENEMA RE) Place 1 Bottle rectally daily as needed (severe constipation (not relieved by bisacodyl)).   solifenacin (VESICARE) 10 MG tablet Take 10 mg by mouth daily. (0800)   TECFIDERA 240 MG CPDR Take 1 capsule (240 mg total) by mouth 2 (two) times daily. (0800 & 2000)   vitamin C (ASCORBIC ACID) 500 MG tablet Take 500 mg by mouth in the morning and at bedtime. (0900 & 1700)   No facility-administered encounter medications on file as of 02/08/2022.    HOSPICE ELIGIBILITY/DIAGNOSIS: TBD  PAST  MEDICAL HISTORY:  Past Medical History:  Diagnosis Date   Age related osteoporosis    Aphasia    Bronchitis    hx of    Cellulitis and abscess of toe    hx of    Closed supracondylar fracture of right femur, periprosthetic 06/01/2014   Constipation    Cystostomy in place [facility])    Decubitus ulcer    Degenerative arthritis    osteoarthritis    Depression    Diabetes mellitus without complication (HCC)    type 2    Edema    localized    Full incontinence of feces    GERD (gastroesophageal reflux disease)    Gout    hx of    History of falling    Hyperlipidemia    Hypertension    Hyperthyroidism    Left perineal ischial pressure ulcer 09/18/2021   Multiple sclerosis (HCC)    Neurogenic bowel    Neuromuscular disorder (HCC)    Bilateral hand carpal tunnel syndrome   Neuromuscular dysfunction of bladder    Nontoxic multinodular goiter    Obesity    Osteomyelitis of coccyx ([place]) 04/05/2021   Osteoporosis    Other adrenocortical insufficiency (HCC)    Paraplegia (HCC)    LEGS   Personal history of urinary (tract) infections    Polyneuropathy    Pressure ulcer of right buttock, stage 4 (HCC)    Sacral osteomyelitis ([place]) 04/05/2021   Spinal stenosis in cervical region    Spinal stenosis of lumbosacral region    Spinal stenosis, thoracic    Tinea unguium    Vitamin D deficiency       ALLERGIES:  Allergies  Allergen Reactions   Codeine Shortness Of Breath and Other (See Comments)    Difficult breathing and skin problem   Ultram [Tramadol] Shortness Of Breath and Other (See Comments)    Difficult breathing and skin peeling   Januvia [Sitagliptin] Rash and Other (See Comments)    Blisters    I spent 45 minutes providing this consultation; time includes spent with patient/family, chart review and documentation. More than 50% of the time in this consultation was spent on care coordination  Thank you for the opportunity to participate in the care of THAILY HACKWORTH  Please call our office at [phone] if we can be of additional assistance.  Note: Portions of this note were generated with [employer]. Dictation errors may occur despite best attempts at proofreading.  Teodoro Spray, NP

## 2022-02-28 ENCOUNTER — Ambulatory Visit: Payer: Commercial Managed Care - HMO | Admitting: Neurology

## 2022-03-13 ENCOUNTER — Ambulatory Visit: Payer: Commercial Managed Care - HMO | Admitting: Neurology

## 2022-03-19 ENCOUNTER — Other Ambulatory Visit: Payer: Self-pay | Admitting: Urology

## 2022-03-22 ENCOUNTER — Encounter (HOSPITAL_COMMUNITY): Payer: Self-pay | Admitting: Urology

## 2022-03-22 NOTE — Progress Notes (Addendum)
Short Stay: COVID testing date: N/A   Anesthesia:  PCP - Dr Edrick Oh last office visit note 02/08/22 in epic by Laverda Sorenson NP Cardiologist - N/A Infectious disease-[place], Lavell Islam, MD  last office note 01/22/22 in epic IR-Shick, Legrand Como, MD  last note 01/17/22 in epic   Chest x-ray - 04/05/21 in epic EKG - 04/06/21 in epic Stress Test -  N/A ECHO -  N/A Cardiac Cath -  N/A Pacemaker/ICD device last checked: N/A   Sleep Study -  N/A CPAP -  N/A   Fasting Blood Sugar - 115-285 Checks Blood Sugar ___1__ times a day Hgb A1C 6.7 03/05/22 on chart   Blood Thinner Instructions: N/A Aspirin Instructions: N/A Last Dose: N/A   Activity level:   paraplegic                                                    Anesthesia review: Multiple sclerosis with extensive brain and spinal cord involvement with a paraplegia,Poor R wave progression on EKG 09/13/20 in epic, HTN, DM   Patient denies shortness of breath, fever, cough and chest pain at PAT appointment

## 2022-03-22 NOTE — Patient Instructions (Addendum)
Preop instructions for:   Laura Roberts   Date of Birth: January 23, 1941                       Date of Procedure:  Monday, Oct. 2, 2023  Procedure:   CYSTOSCOPYBOTOX INJECTION,   SUPRAPUBIC CATHETER EXCHANGE UPSIZING Surgeon: Dr. Link Snuffer Facility contact:  [facility]     Phone: [place]: RN contact name/phone#:                          and Fax #:   [phone]   Transportation contact phone#:     Time to arrive at [facility]:  8:30 AM   Report to: Admitting (On your left hand side)    Do not eat solid food past midnight the night before your procedure.(To include any tube feedings-must be discontinued)  May have the following until       AM/PM day of procedure  CLEAR LIQUID DIET  Water Black Coffee (sugar ok, NO MILK/CREAM OR CREAMERS)  Tea (sugar ok, NO MILK/CREAM OR CREAMERS) regular and decaf                             Plain Jell-O (NO RED)                                           Fruit ices (not with fruit pulp, NO RED)                                     Popsicles (NO RED)                                                                  Juice: apple, WHITE grape, WHITE cranberry Sports drinks like Gatorade (NO RED)   Take these morning medications only with sips of water.(or give through gastrostomy or feeding tube). Baclofen, Duloxetine, Gabapentin, Methimazole, Metoprolol, Pantoprazole, Prednisone, Solifenacin, Tecfidera, Use eyedrops   The Night Before Surgery: Take only half (1/2) of Lantus at bedtime   DO NOT TAKE TRULICITY AFTER 4/43/1540!!!!!   Note: No Insulin or Diabetic meds should be given or taken the morning of the procedure!     Please send day of procedure:current med list and meds last taken that day, confirm nothing by mouth status from what time, Patient Demographic info( to include DNR status, problem list, allergies)   Bring Insurance card and picture ID Leave all jewelry and other valuables  at place where living( no metal or rings to be worn) No contact lens Women-no make-up, no lotions,perfumes,powders  Any questions day of procedure,call  SHORT [phone]     Sent from :[facility]                   [place]  Fax:[phone]   Sent by :   Kennedie Pardoe BSN, RN

## 2022-03-29 NOTE — Progress Notes (Addendum)
Preop instructions for: Laura Roberts  Date of Birth: [birth year]                   Date of Procedure:   04/01/22 Procedure:   CYSTOSCOPY BOTOX INJECTION, SUPRAPUBIC CATHETER EXCHANGE Surgeon:  Dr. Link Snuffer Facility contact: [facility]    Phone: [place]: RN contact name/phone#:  Sonia Side                   and Fax #: [phone]   Transportation contact phone#: Corey Harold [phone]     Time to arrive at [facility]: 8:15 AM   Report to: Admitting (On your left hand side)    Do not eat or drink past midnight the night before your procedure.(To include any tube feedings-must be discontinued)  DO NOT TAKE TRULICITY AFTER SEPT. 24, 2023- 7 DAYS PRIOR TO SURGERY   Take these morning medications only with sips of water.(or give through gastrostomy or feeding tube).  Baclofen Duloxetine Dimethyl Fumarate Gabapentin Methimazole Metoprolol Pantoprazole Prednisone Solifenacin Tecfidera Use eyedrops Acetaminophen if needed Ondansetron if needed    Note: No Insulin or Diabetic meds should be given or taken the morning of the procedure!   Please send day of procedure:current med list and meds last taken that day, confirm nothing by mouth status from what time, Patient Demographic info( to include DNR status, problem list, allergies)   Bring Insurance card and picture ID Leave all jewelry and other valuables at place where living( no metal or rings to be worn) No contact lens Women-no make-up, no lotions,perfumes,powders   Any questions day of procedure,call  SHORT STAY-[phone]     Sent from :[facility]                   [phone]                   Fax:[phone]   Sent by :   Harlon Flor BSN, RN

## 2022-04-01 ENCOUNTER — Ambulatory Visit (HOSPITAL_COMMUNITY)
Admission: RE | Admit: 2022-04-01 | Discharge: 2022-04-01 | Disposition: A | Discharge status: Home / Self Care | Payer: Commercial Managed Care - HMO | Attending: Urology | Admitting: Urology

## 2022-04-01 ENCOUNTER — Ambulatory Visit (HOSPITAL_BASED_OUTPATIENT_CLINIC_OR_DEPARTMENT_OTHER): Payer: Commercial Managed Care - HMO | Admitting: Registered Nurse

## 2022-04-01 ENCOUNTER — Encounter (HOSPITAL_COMMUNITY)
Admission: RE | Disposition: A | Discharge status: Home / Self Care | Payer: Self-pay | Source: Home / Self Care | Attending: Urology

## 2022-04-01 ENCOUNTER — Telehealth: Payer: Self-pay

## 2022-04-01 ENCOUNTER — Other Ambulatory Visit: Payer: Self-pay

## 2022-04-01 ENCOUNTER — Ambulatory Visit (HOSPITAL_COMMUNITY): Payer: Commercial Managed Care - HMO | Admitting: Registered Nurse

## 2022-04-01 ENCOUNTER — Encounter (HOSPITAL_COMMUNITY): Payer: Self-pay | Admitting: Urology

## 2022-04-01 ENCOUNTER — Other Ambulatory Visit: Payer: Self-pay | Admitting: Neurology

## 2022-04-01 DIAGNOSIS — N319 Neuromuscular dysfunction of bladder, unspecified: Secondary | ICD-10-CM | POA: Diagnosis present

## 2022-04-01 DIAGNOSIS — K219 Gastro-esophageal reflux disease without esophagitis: Secondary | ICD-10-CM | POA: Insufficient documentation

## 2022-04-01 DIAGNOSIS — I10 Essential (primary) hypertension: Secondary | ICD-10-CM | POA: Diagnosis not present

## 2022-04-01 DIAGNOSIS — Z794 Long term (current) use of insulin: Secondary | ICD-10-CM | POA: Insufficient documentation

## 2022-04-01 DIAGNOSIS — E1140 Type 2 diabetes mellitus with diabetic neuropathy, unspecified: Secondary | ICD-10-CM

## 2022-04-01 DIAGNOSIS — M1990 Unspecified osteoarthritis, unspecified site: Secondary | ICD-10-CM | POA: Diagnosis not present

## 2022-04-01 DIAGNOSIS — E119 Type 2 diabetes mellitus without complications: Secondary | ICD-10-CM

## 2022-04-01 DIAGNOSIS — G8220 Paraplegia, unspecified: Secondary | ICD-10-CM | POA: Diagnosis not present

## 2022-04-01 DIAGNOSIS — G35 Multiple sclerosis: Secondary | ICD-10-CM

## 2022-04-01 DIAGNOSIS — Z7984 Long term (current) use of oral hypoglycemic drugs: Secondary | ICD-10-CM | POA: Diagnosis not present

## 2022-04-01 DIAGNOSIS — F32A Depression, unspecified: Secondary | ICD-10-CM | POA: Insufficient documentation

## 2022-04-01 HISTORY — DX: Pressure ulcer of unspecified buttock, unspecified stage: L89.309

## 2022-04-01 HISTORY — DX: Displaced intertrochanteric fracture of unspecified femur, initial encounter for closed fracture: S72.143A

## 2022-04-01 HISTORY — DX: Anemia, unspecified: D64.9

## 2022-04-01 HISTORY — PX: BOTOX INJECTION: SHX5754

## 2022-04-01 HISTORY — DX: Pressure ulcer of sacral region, unspecified stage: L89.159

## 2022-04-01 HISTORY — PX: INSERTION OF SUPRAPUBIC CATHETER: SHX5870

## 2022-04-01 LAB — BASIC METABOLIC PANEL
Anion gap: 11 (ref 5–15)
BUN: 21 mg/dL (ref 8–23)
CO2: 30 mmol/L (ref 22–32)
Calcium: 9.5 mg/dL (ref 8.9–10.3)
Chloride: 100 mmol/L (ref 98–111)
Creatinine, Ser: 0.5 mg/dL (ref 0.44–1.00)
GFR, Estimated: >60 mL/min (ref >60)
Glucose, Bld: 124 mg/dL — ABNORMAL HIGH (ref 70–99)
Potassium: 3.9 mmol/L (ref 3.5–5.1)
Sodium: 141 mmol/L (ref 135–145)

## 2022-04-01 LAB — GLUCOSE, CAPILLARY
Glucose-Capillary: 97 mg/dL (ref 70–99)
Glucose-Capillary: 152 mg/dL — ABNORMAL HIGH (ref 70–99)

## 2022-04-01 SURGERY — BOTOX INJECTION
Anesthesia: General

## 2022-04-01 MED ORDER — FENTANYL CITRATE (PF) 100 MCG/2ML IJ SOLN
INTRAMUSCULAR | Status: AC
  Filled 2022-04-01: qty 2

## 2022-04-01 MED ORDER — SODIUM CHLORIDE (PF) 0.9 % IJ SOLN
INTRAMUSCULAR | Status: AC
  Filled 2022-04-01: qty 20

## 2022-04-01 MED ORDER — ONABOTULINUMTOXINA 100 UNITS IJ SOLR
INTRAMUSCULAR | Status: AC
  Filled 2022-04-01: qty 200

## 2022-04-01 MED ORDER — ONDANSETRON HCL 4 MG/2ML IJ SOLN: 4.0000 mg | Freq: Once | INTRAMUSCULAR | Status: DC | PRN

## 2022-04-01 MED ORDER — OXYCODONE HCL 5 MG PO TABS: 5.0000 mg | ORAL_TABLET | Freq: Once | ORAL | Status: DC | PRN

## 2022-04-01 MED ORDER — ORAL CARE MOUTH RINSE: 15.0000 mL | Freq: Once | OROMUCOSAL | Status: AC

## 2022-04-01 MED ORDER — OXYCODONE HCL 5 MG/5ML PO SOLN: 5.0000 mg | Freq: Once | ORAL | Status: DC | PRN

## 2022-04-01 MED ORDER — PROPOFOL 10 MG/ML IV BOLUS
INTRAVENOUS | Status: AC
  Filled 2022-04-01: qty 20

## 2022-04-01 MED ORDER — FENTANYL CITRATE (PF) 100 MCG/2ML IJ SOLN
INTRAMUSCULAR | Status: DC | PRN
  Administered 2022-04-01 (×2): 25 ug via INTRAVENOUS

## 2022-04-01 MED ORDER — PROPOFOL 10 MG/ML IV BOLUS
INTRAVENOUS | Status: DC | PRN
  Administered 2022-04-01: 120 mg via INTRAVENOUS

## 2022-04-01 MED ORDER — SODIUM CHLORIDE 0.9 % IV SOLN
1.0000 g | INTRAVENOUS | Status: AC
  Administered 2022-04-01: 1 g via INTRAVENOUS
  Filled 2022-04-01: qty 20

## 2022-04-01 MED ORDER — PHENYLEPHRINE 80 MCG/ML (10ML) SYRINGE FOR IV PUSH (FOR BLOOD PRESSURE SUPPORT)
PREFILLED_SYRINGE | INTRAVENOUS | Status: AC
  Filled 2022-04-01: qty 10

## 2022-04-01 MED ORDER — FENTANYL CITRATE PF 50 MCG/ML IJ SOSY
25.0000 ug | PREFILLED_SYRINGE | INTRAMUSCULAR | Status: DC | PRN
  Administered 2022-04-01: 25 ug via INTRAVENOUS

## 2022-04-01 MED ORDER — ONDANSETRON HCL 4 MG/2ML IJ SOLN
INTRAMUSCULAR | Status: DC | PRN
  Administered 2022-04-01: 4 mg via INTRAVENOUS

## 2022-04-01 MED ORDER — 0.9 % SODIUM CHLORIDE (POUR BTL) OPTIME
TOPICAL | Status: DC | PRN
  Administered 2022-04-01: 1000 mL

## 2022-04-01 MED ORDER — DEXAMETHASONE SODIUM PHOSPHATE 10 MG/ML IJ SOLN
INTRAMUSCULAR | Status: DC | PRN
  Administered 2022-04-01: 8 mg via INTRAVENOUS

## 2022-04-01 MED ORDER — FENTANYL CITRATE PF 50 MCG/ML IJ SOSY
PREFILLED_SYRINGE | INTRAMUSCULAR | Status: AC
  Filled 2022-04-01: qty 1

## 2022-04-01 MED ORDER — CHLORHEXIDINE GLUCONATE 0.12 % MT SOLN
15.0000 mL | Freq: Once | OROMUCOSAL | Status: AC
  Administered 2022-04-01: 15 mL via OROMUCOSAL

## 2022-04-01 MED ORDER — DEXAMETHASONE SODIUM PHOSPHATE 10 MG/ML IJ SOLN
INTRAMUSCULAR | Status: AC
  Filled 2022-04-01: qty 1

## 2022-04-01 MED ORDER — METOPROLOL SUCCINATE ER 25 MG PO TB24
50.0000 mg | ORAL_TABLET | Freq: Once | ORAL | Status: AC
  Administered 2022-04-01: 50 mg via ORAL
  Filled 2022-04-01: qty 2

## 2022-04-01 MED ORDER — LACTATED RINGERS IV SOLN: INTRAVENOUS | Status: DC

## 2022-04-01 MED ORDER — ONDANSETRON HCL 4 MG/2ML IJ SOLN
INTRAMUSCULAR | Status: AC
  Filled 2022-04-01: qty 2

## 2022-04-01 MED ORDER — LIDOCAINE HCL (PF) 2 % IJ SOLN
INTRAMUSCULAR | Status: AC
  Filled 2022-04-01: qty 5

## 2022-04-01 MED ORDER — PHENYLEPHRINE 80 MCG/ML (10ML) SYRINGE FOR IV PUSH (FOR BLOOD PRESSURE SUPPORT)
PREFILLED_SYRINGE | INTRAVENOUS | Status: DC | PRN
  Administered 2022-04-01: 80 ug via INTRAVENOUS

## 2022-04-01 MED ORDER — LIDOCAINE 2% (20 MG/ML) 5 ML SYRINGE
INTRAMUSCULAR | Status: DC | PRN
  Administered 2022-04-01: 80 mg via INTRAVENOUS

## 2022-04-01 MED ORDER — STERILE WATER FOR IRRIGATION IR SOLN
Status: DC | PRN
  Administered 2022-04-01: 500 mL

## 2022-04-01 MED ORDER — SODIUM CHLORIDE 0.9 % IR SOLN
Status: DC | PRN
  Administered 2022-04-01: 18000 mL

## 2022-04-01 MED ORDER — ONABOTULINUMTOXINA 100 UNITS IJ SOLR
INTRAMUSCULAR | Status: DC | PRN
  Administered 2022-04-01: 200 [IU] via INTRAMUSCULAR

## 2022-04-01 SURGICAL SUPPLY — 42 items
BAG COUNTER SPONGE SURGICOUNT (BAG) IMPLANT
BAG DRN RND TRDRP ANRFLXCHMBR (UROLOGICAL SUPPLIES) ×1
BAG SPNG CNTER NS LX DISP (BAG)
BAG URINE DRAIN 2000ML AR STRL (UROLOGICAL SUPPLIES) ×1 IMPLANT
BAG URINE LEG 500ML (DRAIN) ×1 IMPLANT
BAG URO CATCHER STRL LF (MISCELLANEOUS) ×1 IMPLANT
BALLN NEPHROSTOMY (BALLOONS) ×1
BALLOON NEPHROSTOMY (BALLOONS) IMPLANT
BLADE SURG 15 STRL LF DISP TIS (BLADE) ×1 IMPLANT
BLADE SURG 15 STRL SS (BLADE)
CATH DILATION NEPHR BALL 15X10 (CATHETERS) IMPLANT
CATH FOLEY 2W COUNCIL 5CC 16FR (CATHETERS) IMPLANT
CATH FOLEY 2W COUNCIL 5CC 18FR (CATHETERS) IMPLANT
CATH FOLEY 2WAY SLVR  5CC 20FR (CATHETERS)
CATH FOLEY 2WAY SLVR 5CC 20FR (CATHETERS) ×1 IMPLANT
CATH FOLEY INTRO SUPRA 16F (CATHETERS) IMPLANT
CATH SET URETHRAL DILATOR (CATHETERS) IMPLANT
CATH URETL OPEN 5X70 (CATHETERS) IMPLANT
CLOTH BEACON ORANGE TIMEOUT ST (SAFETY) ×1 IMPLANT
DEVICE INFLATION 20CC 30ATM (MISCELLANEOUS) IMPLANT
ELECT REM PT RETURN 15FT ADLT (MISCELLANEOUS) ×1 IMPLANT
GAUZE SPONGE 4X4 12PLY STRL (GAUZE/BANDAGES/DRESSINGS) IMPLANT
GLOVE BIO SURGEON STRL SZ7.5 (GLOVE) ×1 IMPLANT
GLOVE SURG LX STRL 7.5 STRW (GLOVE) ×1 IMPLANT
GLOVE SURG SS PI 8.0 STRL IVOR (GLOVE) ×1 IMPLANT
GOWN STRL REUS W/ TWL XL LVL3 (GOWN DISPOSABLE) ×2 IMPLANT
GOWN STRL REUS W/TWL XL LVL3 (GOWN DISPOSABLE) ×2
KIT TURNOVER KIT A (KITS) IMPLANT
MANIFOLD NEPTUNE II (INSTRUMENTS) ×1 IMPLANT
NDL ASPIRATION 22 (NEEDLE) ×1 IMPLANT
NDL SAFETY ECLIP 18X1.5 (MISCELLANEOUS) IMPLANT
NEEDLE ASPIRATION 22 (NEEDLE) ×1 IMPLANT
NEEDLE HYPO 22GX1.5 SAFETY (NEEDLE) ×1 IMPLANT
PACK CYSTO (CUSTOM PROCEDURE TRAY) ×1 IMPLANT
PENCIL SMOKE EVACUATOR (MISCELLANEOUS) IMPLANT
PLUG CATH AND CAP STER (CATHETERS) ×1 IMPLANT
SPONGE DRAIN TRACH 4X4 STRL 2S (GAUZE/BANDAGES/DRESSINGS) ×1 IMPLANT
SUT ETHILON 2 0 PS N (SUTURE) ×1 IMPLANT
SYR CONTROL 10ML LL (SYRINGE) IMPLANT
TAPE CLOTH SURG 4X10 WHT LF (GAUZE/BANDAGES/DRESSINGS) IMPLANT
TOWEL OR 17X26 10 PK STRL BLUE (TOWEL DISPOSABLE) ×1 IMPLANT
TUBING CONNECTING 10 (TUBING) IMPLANT

## 2022-04-01 NOTE — Anesthesia Preprocedure Evaluation (Addendum)
Anesthesia Evaluation  Patient identified by MRN, date of birth, ID band Patient awake    Reviewed: Allergy & Precautions, NPO status , Patient's Chart, lab work & pertinent test results, reviewed documented beta blocker date and time   History of Anesthesia Complications Negative for: history of anesthetic complications  Airway Mallampati: III  TM Distance: >3 FB Neck ROM: Full    Dental  (+) Dental Advisory Given, Partial Upper   Pulmonary neg pulmonary ROS,    Pulmonary exam normal        Cardiovascular hypertension, Pt. on medications and Pt. on home beta blockers Normal cardiovascular exam     Neuro/Psych PSYCHIATRIC DISORDERS Depression  Multiple sclerosis Paraplegia    Neuromuscular disease    GI/Hepatic Neg liver ROS, GERD  Controlled and Medicated, Fecal incontinence    Endo/Other  diabetes, Type 2, Oral Hypoglycemic Agents, Insulin DependentHyperthyroidism   Renal/GU negative Renal ROS Bladder dysfunction   Cystostomy     Musculoskeletal  (+) Arthritis ,   Abdominal   Peds  Hematology negative hematology ROS (+)   Anesthesia Other Findings   Reproductive/Obstetrics                            Anesthesia Physical Anesthesia Plan  ASA: 4  Anesthesia Plan: General   Post-op Pain Management: Minimal or no pain anticipated   Induction: Intravenous  PONV Risk Score and Plan: 3 and Treatment may vary due to age or medical condition and Ondansetron  Airway Management Planned: LMA  Additional Equipment: None  Intra-op Plan:   Post-operative Plan: Extubation in OR  Informed Consent: I have reviewed the patients History and Physical, chart, labs and discussed the procedure including the risks, benefits and alternatives for the proposed anesthesia with the patient or authorized representative who has indicated his/her understanding and acceptance.   Patient has DNR.   Discussed DNR with patient and Suspend DNR.   Dental advisory given  Plan Discussed with: CRNA and Anesthesiologist  Anesthesia Plan Comments:       Anesthesia Quick Evaluation

## 2022-04-01 NOTE — Telephone Encounter (Signed)
Rcvd labs requested from [place]. Placed in [facility] box.

## 2022-04-01 NOTE — Op Note (Signed)
Operative Note  Preoperative diagnosis:  1.  Neurogenic bladder  Postoperative diagnosis: 1.  Neurogenic bladder  Procedure(s): 1.  Cystoscopy with 200 units of Botox, complex suprapubic tube upsizing and exchange  Surgeon: Link Snuffer, MD  Assistants:Obafunbi Kathrynn Ducking, MD, resident  Anesthesia: General  Complications: None immediate  EBL: 20 cc  Specimens: 1.  None  Drains/Catheters: 1.  16 French council tip catheter  Intraoperative findings: 1.  Very patulous urethra 2.  Edematous bladder mucosa on the anterior bladder wall consistent with long-term suprapubic tube.  No concerning masses.  Indication: 81 year old female with neurogenic bladder managed with intradetrusor Botox and suprapubic tube.  She recently had a suprapubic tube placed and a new tract because the other one fell out and the tract closed.  She presents also for upsizing.  Description of procedure:  The patient was identified and consent was obtained.  The patient was taken to the operating room and placed in the supine position.  The patient was placed under all anesthesia.  Perioperative antibiotics were administered.  The patient was placed in dorsal lithotomy.  Patient was prepped and draped in a standard sterile fashion and a timeout was performed.  A 21 French scope was advanced into the urethra and into the bladder.  200 units of Botox was systematically injected into the detrusor throughout the bladder.  The suprapubic tube was then removed.  A wire was passed through the suprapubic tract and out the urethra.  I first attempted to dilate with female sounds but there was some resistance.  Therefore, I dilated the tract with a 24 French balloon dilator.  I then tried to advance a teen and 16 [place] council tip catheter but this met resistance.  I attempted to dilate with renal dilators.  I was able to dilate from 14 [place] up to 15 [place].  There was very tight strong resistance.  I was not able to dilate  any further than 18 [place] and a 16 [place] council tip catheter still would not pass.  I therefore used a 30 French balloon dilator to dilate the suprapubic tube tract.  I let this dilate for 2 minutes.  I then removed the balloon dilator and was able to pass a 16 [place] council tip catheter into the bladder.  10 cc of sterile water was instilled to the catheter balloon.  Wire was withdrawn.  There was no significant active bleeding from the bladder.  The catheter was secured down with a nylon stitch.  Plan: Patient will follow up in 1 month and we will upsize the suprapubic tube in the operating room.

## 2022-04-01 NOTE — Transfer of Care (Signed)
Immediate Anesthesia Transfer of Care Note  Patient: Laura Roberts  Procedure(s) Performed: CYSTOSCOPYBOTOX INJECTION SUPRAPUBIC CATHETER EXCHANGE UPSIZING & DILATION OF SUPRAPUBIC  TRACT  Patient Location: PACU  Anesthesia Type:General  Level of Consciousness: drowsy and patient cooperative  Airway & Oxygen Therapy: Patient Spontanous Breathing and Patient connected to face mask oxygen  Post-op Assessment: Report given to RN and Post -op Vital signs reviewed and stable  Post vital signs: Reviewed and stable  Last Vitals:  Vitals Value Taken Time  BP 114/40 04/01/22 1418  Temp    Pulse 99 04/01/22 1422  Resp 14 04/01/22 1422  SpO2 100 % 04/01/22 1422  Vitals shown include unvalidated device data.  Last Pain:  Vitals:   04/01/22 1042  TempSrc:   PainSc: 0-No pain      Patients Stated Pain Goal: 3 (14/99/69 2493)  Complications: No notable events documented.

## 2022-04-01 NOTE — Anesthesia Procedure Notes (Signed)
Procedure Name: LMA Insertion Date/Time: 04/01/2022 12:19 PM  Performed by: Victoriano Lain, CRNAPre-anesthesia Checklist: Patient identified, Emergency Drugs available, Suction available, Patient being monitored and Timeout performed Patient Re-evaluated:Patient Re-evaluated prior to induction Oxygen Delivery Method: Circle system utilized Preoxygenation: Pre-oxygenation with 100% oxygen Induction Type: IV induction LMA: LMA with gastric port inserted LMA Size: 4.0 Number of attempts: 1 Placement Confirmation: positive ETCO2 and breath sounds checked- equal and bilateral Tube secured with: Tape Dental Injury: Teeth and Oropharynx as per pre-operative assessment

## 2022-04-01 NOTE — H&P (Signed)
CC/HPI: cc: Neurogenic bladder  HPI:  10/31/17  A 81 year old female with a history of MS and neurogenic bladder. He recently underwent a suprapubic tube placement on 09/09/2017. She was admitted to the hospital with cellulitis around the suprapubic tube site and was seen by Dr. Junious Silk. She has improved from this. She had a SP tube change on 10/13/2017. This was exchanged for a 14 [place] tube. She presents for follow-up. She was recently hospitalized for a groin abscess that was incised and drained. She is happy now with her suprapubic tube. She has mild leakage around the catheter but not much. Infection has improved.   11/17/2017:  Patient presents today for suprapubic change. She has no other complaints.   12/03/17: Patient presents today with complaints of decreased urinary output from her SP tube site and increased urethral leakage. She states that this has been ongoing for the past week. She is having to wear 2 adult diapers per day, which are typically soaked. She denies any current suprapubic discomfort or abdominal pain. She denies fever or gross hematuria. No nausea or vomiting. She currently uses Detrol for bladder spasms.   12/23/17: She returns today with complaints of increased urinary leakage per her urethra. She remains on Detrol. She was seen in the ED on 6/14 after her SP catheter came out and was not able to be replaced her rehab facility. Per her report, they had a difficult time replaced the catheter and a 12 Fr. SP catheter was eventually placed. Today she states that it intermittently drains well. She states that is draining okay currently, but was not draining well this morning. She notes that when it is not draining, she is having increased leakage per her urethra. She denies fevers or gross hematuria. No suprapubic discomfort.   01/06/2018:  The patient continues with a suprapubic catheter but continues to have significant leakage per urethra. She is interested and entered detrusor  Botox. She has failed oral agents including Detrol.   05/05/2018:  Patient is status post cystoscopy with Botox instillation on 01/30/2018. She initially did well with this. Urine leakage decreased. Of note, during the surgery, she had a very patulous urethra and I was able to insert a finger into the bladder. However, Botox did improve her symptoms of leakage and bladder spasms. Over the past few days, she had an increase in spasms and leakage and the catheter was not draining but after manipulation her catheter has gone back to normal.+   07/06/18: She returns today for follow up. She is s/p Botox instillation as noted above. She presents today with complications regarding SP tube. She states that her SP tube came out late Saturday evening and they were unable to replace this at her SNF. She states that it was draining well up until that point in time. Until this point, she states she has been having some leakage per her urethra intermittently, but generally felt that she had been doing well. Since SP catheter has been out, she has had significant amount of leakage per her urethra since that time and is soaking diapers. No fevers or chills.   07/23/18:  Patient is s/p replacement of SPT, which she underwent on 07/15/18. She c/o bladder spasms. She takes [employer]. SPT has been draining clear, yellow urine.   08/04/2018  Patient had her suprapubic tube replaced on 07/15/2018. She wants it up size. Unfortunately, cannot upsize currently since it has only been 2 weeks and the tract is not mature. She has not had  Botox in about 6 months. She desires this as well.   07/04/2020  Patient underwent Botox with suprapubic tube exchange on 04/10/2020. Her facility tried change the tube and failed. Therefore, she had to go to interventional Radiology last month for a guided suprapubic tube placement. She is scheduled for up sizing on 08/01/2020.Her catheter is draining but she is having some leakage from her urethra. She  has a very patulous urethra.   05/03/2021  Patient underwent cystoscopy with Botox instillation on September 7. In this regards, she has been doing very well. She does state that she had to have a colostomy to avoid fecal contamination of a sacral wound. In regards to her urinary system, she denies any significant leakage. She feels like her suprapubic tube is draining well.   03/15/2022: Patient with history of neurogenic bladder managed with suprapubic catheter which is exchanged on a routine basis. She does have chronic leaking from the urethra, this is managed with Botox injections approximately every 6 months. She last underwent Botox in early March of this year. She's had issues with catheter spontaneously following out. She has been managed with an indwelling urethral catheter until IR replaced a 12 [place] suprapubic tube on 7/20. Her catheter has not been exchanged since then.   PMH with multiple morbidities requiring close monitoring/management with high risk of complications and morbidity: Sacral osteomyelitis, multiple sclerosis, muscle spasm, type 2 diabetes mellitus, depression, neurogenic bladder. She is established with palliative care and has a DNR in place.   Continues to to have sensory unaware incontinence which is managed with frequent diaper changes. She is tolerating the suprapubic catheter well since last placed by IR, it has been draining appropriately by her report. Today presents on EMS stretcher. She is in no acute distress. Continues to reside at [facility]. Denies fevers or chills, nausea/vomiting, recent infection treatment.     ALLERGIES: Codeine Januvia Tramadol - Hives    MEDICATIONS: Metoprolol Succinate 50 mg tablet, extended release 24 hr  Omeprazole 20 mg tablet, delayed release  Acetaminophen  Amlodipine Besylate 10 mg tablet  Artificial Tears  Baclofen 10 mg tablet  Calcium Citrate  Cymbalta 30 mg capsule,delayed release  Duloxetine Hcl 30 mg capsule,delayed  release  Gabapentin 300 mg capsule  Glimepiride 2 mg tablet  Ipratropium Bromide  Lantus  Lantus Solostar  Lidoderm  Loperamide 2 mg tablet  Magnesium  Methimazole 5 mg tablet  Minerin cream  Miralax  Mucinex  Mucinex D 600 mg-60 mg tablet, extended release 12 hr  Multivitamin With Minerals  Ondansetron Hcl 4 mg tablet  Pantoprazole Sodium 40 mg tablet, delayed release  Potassium  Prednisone 2.5 mg tablet  Prednisone 5 mg tablet  Prednisone 5 mg tablet  Prednisone 2.5 mg tablet  Restasis 0.05 % dropperette, single-use drop dispenser  Senna  Solifenacin Succinate 10 mg tablet  Spironolactone 100 mg tablet  Tecfidera 240 mg capsule,delayed release  Tinactin  Trulicity  Vitamin D3     Notes: Detrol [place] rx faxed to [facility]   GU PSH: Compl Change SP Tube - 03/07/2021, 2021, 2019, 2019 Cystourethroscopy, W/Injection For Chemodenervation Of Bladder - 09/03/2021, 03/07/2021, 2022, 04/10/2020, 2021, 2020, 2019 Simple Change SP Tube - 09/03/2021, 07/31/2021, 06/05/2021, 05/03/2021, 10/12/2020, 2022, 04/10/2020, 2020, 2019, 2019     NON-GU PSH: None   GU PMH: Areflexic bladder - 07/31/2021, - 06/05/2021, - 10/12/2020, - 2022, - 2019, - 2019 Bladder, Neuromuscular dysfunction, Unspec - 05/03/2021, - 10/12/2020, - 2022, - 2020 Incontinence w/o Sensation - 05/03/2021, -  2022 Urinary Tract Inf, Unspec site - 2020 Detrusor overactivity - 2019 Unihibited neuropathic bladder - 2019    NON-GU PMH: Hypercholesterolemia Hypertension Major depressive disorder, recurrent, in partial remission Multiple sclerosis Spinal stenosis, thoracic region    FAMILY HISTORY: None   SOCIAL HISTORY: Marital Status: Unknown Preferred Language: [place]; Ethnicity: Not Hispanic Or Latino; Race: Black or African American Current Smoking Status: Patient has never smoked.   Tobacco Use Assessment Completed: Used Tobacco in last 30 days? Does not use smokeless tobacco. Has never drank.  Does not use drugs. Does not  drink caffeine. Has not had a blood transfusion.    REVIEW OF SYSTEMS:    GU Review Female:   Patient reports leakage of urine. Patient denies frequent urination, hard to postpone urination, burning /pain with urination, get up at night to urinate, stream starts and stops, trouble starting your stream, have to strain to urinate, and being pregnant.  Gastrointestinal (Upper):   Patient denies nausea, vomiting, and indigestion/ heartburn.  Gastrointestinal (Lower):   Patient denies diarrhea and constipation.  Constitutional:   Patient denies fever, night sweats, weight loss, and fatigue.  Skin:   Patient denies skin rash/ lesion and itching.  Eyes:   Patient denies blurred vision and double vision.  Ears/ Nose/ Throat:   Patient denies sore throat and sinus problems.  Hematologic/Lymphatic:   Patient denies swollen glands and easy bruising.  Cardiovascular:   Patient denies leg swelling and chest pains.  Respiratory:   Patient denies cough and shortness of breath.  Endocrine:   Patient denies excessive thirst.  Musculoskeletal:   Patient denies back pain and joint pain.  Neurological:   Patient denies headaches and dizziness.  Psychologic:   Patient denies depression and anxiety.   VITAL SIGNS:      03/15/2022 09:44 AM  BP 117/84 mmHg  Heart Rate 88 /min  Temperature 98.0 F / 36.6 C   MULTI-SYSTEM PHYSICAL EXAMINATION:    Constitutional: Obese. No physical deformities. Normally developed. Good grooming. Patient immobile, on EMS stretcher. Answering all questions appropriately, mentating at baseline. In no acute distress.  Neck: Neck symmetrical, not swollen. Normal tracheal position.  Respiratory: No labored breathing, no use of accessory muscles.   Cardiovascular: Normal temperature, normal extremity pulses, no swelling, no varicosities.  Neurologic / Psychiatric: Oriented to time, oriented to place, oriented to person. No depression, no anxiety, no agitation.  Gastrointestinal: Obese  abdomen. Suprapubic tube present grossly clear-yellow urine draining to gravity. Site grossly unremarkable. There is some evidence of purple bag syndrome in collection bag which is not all that surprising given time since last exchange. Patient with colostomy as well. No mass, no tenderness, no rigidity.      Complexity of Data:  Source Of History:  Patient, Family/Caregiver, Medical Record Summary  Records Review:   Previous Doctor Records, Previous Hospital Records, Previous Patient Records  Urine Test Review:   Urine Culture   PROCEDURES: None   ASSESSMENT:      ICD-10 Details  1 GU:   Bladder, Neuromuscular dysfunction, Unspec - N31.9 Chronic, Stable  2   Incontinence w/o Sensation - N39.42 Chronic, Stable   PLAN:           Schedule Return Visit/Planned Activity: 1-2 Weeks - Follow up MD, Schedule Surgery          Document Letter(s):  Created for Patient: Clinical Summary         Notes:   Patient in stable/appropriate condition on exam today. Presentation discussed  with her urologist. We will plan to take her back to the operating room in the next few weeks for suprapubic catheter removal and upsizing. She will also have repeat Botox performed at that time. All questions answered to the best of my ability regarding the procedure and expected postoperative course with understanding expressed by the patient. Paperwork will be turned into the scheduler this morning and patient/facility care coordinators will be contacted to set this up.    Signed by Jiles Crocker, NP on 03/15/22 at 11:34 AM (EDT

## 2022-04-02 ENCOUNTER — Encounter (HOSPITAL_COMMUNITY): Payer: Self-pay | Admitting: Urology

## 2022-04-02 NOTE — Anesthesia Postprocedure Evaluation (Signed)
Anesthesia Post Note  Patient: Laura Roberts  Procedure(s) Performed: CYSTOSCOPYBOTOX INJECTION SUPRAPUBIC CATHETER EXCHANGE UPSIZING & DILATION OF SUPRAPUBIC  TRACT     Patient location during evaluation: PACU Anesthesia Type: General Level of consciousness: awake and alert Pain management: pain level controlled Vital Signs Assessment: post-procedure vital signs reviewed and stable Respiratory status: spontaneous breathing, nonlabored ventilation, respiratory function stable and patient connected to nasal cannula oxygen Cardiovascular status: blood pressure returned to baseline and stable Postop Assessment: no apparent nausea or vomiting Anesthetic complications: no   No notable events documented.  Last Vitals:  Vitals:   04/01/22 1445 04/01/22 1500  BP: (!) 119/51 (!) 122/56  Pulse: 98 96  Resp: 12 11  Temp:    SpO2: 93% 95%    Last Pain:  Vitals:   04/01/22 1500  TempSrc:   PainSc: 3    Pain Goal: Patients Stated Pain Goal: 3 (04/01/22 1042)                 Tiajuana Amass

## 2022-04-28 NOTE — Progress Notes (Unsigned)
[facility]  PATIENT: Laura Roberts DOB: [birth year]  REFERRING DOCTOR OR PCP: Madison Hickman MD SOURCE: Patient, notes from neurology, imaging and lab reports, MRI images personally reviewed.  _________________________________   HISTORICAL  CHIEF COMPLAINT:  Chief Complaint  Patient presents with   New Patient (Initial Visit)    Pt in room #2 and she has EMT with her. Pt here today for Tecfidera for her MS.    HISTORY OF PRESENT ILLNESS:  I had the pleasure seeing your patient, Laura Roberts, at the [facility] for neurologic transfer of care regarding her multiple sclerosis.  Update 04/30/2022 MS History: She was diagnosed with MS in 2006 after presenting with leg issues.   MRI of the spine showed some foci worrisome for MS and she was referred to Dr. Erling Cruz.    She had CSF analysis that was reportedly abnormal ad c/w MS (I can't find results).      She was placed on Rebif.   She was switched to Tecfidera around 2014 and she has tolerated it well.    She has had no recent MRI.     She had spinal stenosis and spinal cord compression in 2007 and she had difficulty with her gait and leg strength.     Currently,  she has left arm weakness and severe bilateral leg weakness.   A Hoyer lift is needed.   She has complete numbness in her legs and groin.   She now has a suprapubic catheter and a colostomy.   She had a sacral decubitus and needed debridement and a drain.   The wound is closing now.    Vision is doing well.      She has fatigue and occasionally takes a nap   Some nights she has trouble sleeping.  She feels depressed liing in a nursing home.     Imaging: MRI of the brain 03/09/2021 showed scattered T2/FLAIR hyperintense foci in the periventricular, juxtacortical and deep white matter of both hemispheres.  Couple small foci are noted in the pons.  The cerebellum appears normal.  Stable compared to the MRI of  03/21/2017.  MRI of the cervical spine 03/09/2021 shows a T2 hyperintense focus posteriorly adjacent to C2-C3. There is multilevel degenerative changes causing mild spinal stenosis at C4-C5.  The thyroid is enlarged with heterogenous signal.  She has a history of multinodular goiter.  MRI of the lumbar and thoracic spine 10/14/2005 and 10/19/2005 showed spinal stenosis with cord compression and myelopathic signal with enhancement at T11-T12 (unlikely to be a demyelinating plaque).  Follow-up thoracic spine images 11/30/2005 showed decompression from T11-L1.  Additionally, there is a focus within the spinal cord adjacent to T6-T7 consistent with a demyelinating plaque  Laboratory CBC with differential 01/29/2022 showed normal lymphocyte count.  REVIEW OF SYSTEMS: Constitutional: No fevers, chills, sweats, or change in appetite Eyes: No visual changes, double vision, eye pain Ear, nose and throat: No hearing loss, ear pain, nasal congestion, sore throat Cardiovascular: No chest pain, palpitations Respiratory:  No shortness of breath at rest or with exertion.   No wheezes GastrointestinaI: No nausea, vomiting, diarrhea, abdominal pain, fecal incontinence Genitourinary:  No dysuria, urinary retention or frequency.  No nocturia. Musculoskeletal:  No neck pain, back pain Integumentary: No rash, pruritus, skin lesions Neurological: as above Psychiatric: No depression at this time.  No anxiety Endocrine: No palpitations, diaphoresis, change in appetite, change in weigh or increased thirst Hematologic/Lymphatic:  No anemia, purpura, petechiae.  Allergic/Immunologic: No itchy/runny eyes, nasal congestion, recent allergic reactions, rashes  ALLERGIES: Allergies  Allergen Reactions   Codeine Shortness Of Breath and Other (See Comments)    Difficult breathing and skin problem   Ultram [Tramadol] Shortness Of Breath and Other (See Comments)    Difficult breathing and skin peeling   Januvia [Sitagliptin] Rash  and Other (See Comments)    Blisters    HOME MEDICATIONS:  Current Outpatient Medications:    acetaminophen (TYLENOL) 325 MG tablet, Take 650 mg by mouth every 6 (six) hours as needed for mild pain or fever. , Disp: , Rfl:    Amino Acids-Protein Hydrolys (FEEDING SUPPLEMENT, PRO-STAT SUGAR FREE 64,) LIQD, Take 30 mLs by mouth 2 (two) times daily. (0900 & 1700), Disp: , Rfl:    baclofen (LIORESAL) 10 MG tablet, Take 10 mg by mouth daily at 12 noon. Midday, Disp: , Rfl:    baclofen (LIORESAL) 20 MG tablet, Take 15-20 mg by mouth See admin instructions. (1000 & 2000) Take 15 mg) by mouth in the morning & 20 mg by mouth at bedtime, Disp: , Rfl:    barrier cream (NON-SPECIFIED) CREA, Apply 1 application topically 2 (two) times daily. (0700 & 1900) Apply to right ischium and bilateral buttocks, Disp: , Rfl:    bisacodyl (DULCOLAX) 10 MG suppository, Place 10 mg rectally daily as needed for moderate constipation (constipation not relieved by milk of magnesium)., Disp: , Rfl:    calcium citrate (CALCITRATE - DOSED IN MG ELEMENTAL CALCIUM) 950 (200 Ca) MG tablet, Take 200 mg of elemental calcium by mouth daily. (0800), Disp: , Rfl:    Carboxymeth-Glycerin-Polysorb (REFRESH OPTIVE ADVANCED OP), Apply 1 drop to eye 4 (four) times daily as needed., Disp: , Rfl:    cholecalciferol (VITAMIN D) 25 MCG (1000 UNIT) tablet, Take 1,000 Units by mouth daily. (1000), Disp: , Rfl:    cycloSPORINE (RESTASIS) 0.05 % ophthalmic emulsion, Place 1 drop into both eyes at bedtime. (2000), Disp: , Rfl:    Dimethyl Fumarate 240 MG CPDR, One po bid, Disp: 180 capsule, Rfl: 4   Dulaglutide (TRULICITY) 3 KD/9.8PJ SOPN, Inject 3 mg into the skin every Tuesday. (1000), Disp: , Rfl:    DULoxetine (CYMBALTA) 30 MG capsule, Take 3 capsules (90 mg total) by mouth 2 (two) times daily. (0800)Give along with 30 mg to = 90 mg (Patient taking differently: Take 90 mg by mouth daily. (1000)), Disp: , Rfl:    feeding supplement, GLUCERNA  SHAKE, (GLUCERNA SHAKE) LIQD, Take 237 mLs by mouth 2 (two) times daily between meals., Disp: , Rfl: 0   ferrous sulfate 325 (65 FE) MG tablet, Take 325 mg by mouth 2 (two) times daily with a meal. (1000 & 2200), Disp: , Rfl:    furosemide (LASIX) 20 MG tablet, Take 1 tablet (20 mg total) by mouth every other day. (Patient taking differently: Take 20 mg by mouth every other day. 10:00), Disp: 30 tablet, Rfl:    gabapentin (NEURONTIN) 100 MG capsule, Take 200 mg by mouth 3 (three) times daily. (0800, 1400 & 2000), Disp: , Rfl:    insulin glargine (LANTUS) 100 UNIT/ML Solostar Pen, Inject 4 Units into the skin at bedtime. (2000), Disp: , Rfl:    loperamide (IMODIUM) 2 MG capsule, Take 2 mg by mouth daily as needed for diarrhea or loose stools (SUBSEQUENT DOSE GIVE ONE TAB AFTER EACH STOOL (max 8 mg/24 hrs.))., Disp: , Rfl:    magnesium hydroxide (MILK OF MAGNESIA) 400 MG/5ML suspension, Take 30  mLs by mouth daily as needed for mild constipation., Disp: , Rfl:    magnesium oxide (MAG-OX) 400 MG tablet, Take 400 mg by mouth 2 (two) times daily. (1000 & 2200), Disp: , Rfl:    methimazole (TAPAZOLE) 5 MG tablet, Take 1 tablet (5 mg total) by mouth daily. (Patient taking differently: Take 5 mg by mouth daily at 6 (six) AM. (0600)), Disp: 30 tablet, Rfl: 5   metoprolol succinate (TOPROL-XL) 50 MG 24 hr tablet, Take 50 mg by mouth daily after breakfast. (0800), Disp: , Rfl:    Multiple Vitamin (MULTIVITAMIN WITH MINERALS) TABS tablet, Take 1 tablet by mouth in the morning. (1000), Disp: , Rfl:    ondansetron (ZOFRAN) 4 MG tablet, Take 4 mg by mouth every 6 (six) hours as needed for nausea or vomiting., Disp: , Rfl:    pantoprazole (PROTONIX) 40 MG tablet, Take 40 mg by mouth daily at 6 (six) AM. (0600), Disp: , Rfl:    polyethylene glycol (MIRALAX / GLYCOLAX) 17 g packet, Take 17 g by mouth daily as needed. (Patient taking differently: Take 17 g by mouth daily. (1000)), Disp: 14 each, Rfl: 0   predniSONE  (DELTASONE) 2.5 MG tablet, Take 2.5 mg by mouth every other day. (1000) Alternating days with 5 mg dose, Disp: , Rfl:    predniSONE (DELTASONE) 5 MG tablet, Take 5 mg by mouth every other day. (10:00) Alternating days with 2.5 mg dose, Disp: , Rfl:    SF 5000 PLUS 1.1 % CREA dental cream, Place 1 application  onto teeth daily at 8 pm. (2000) Pea size  Spit our excess and do not rinse water, Disp: , Rfl:    Sodium Phosphates (RA SALINE ENEMA RE), Place 1 Bottle rectally daily as needed (severe constipation (not relieved by bisacodyl))., Disp: , Rfl:    solifenacin (VESICARE) 10 MG tablet, Take 10 mg by mouth daily. (0800), Disp: , Rfl:    vitamin C (ASCORBIC ACID) 500 MG tablet, Take 500 mg by mouth in the morning and at bedtime. (1000 & 2200), Disp: , Rfl:   PAST MEDICAL HISTORY: Past Medical History:  Diagnosis Date   Age related osteoporosis    Anemia    Aphasia    Bronchitis    hx of    Cellulitis and abscess of toe    hx of    Closed supracondylar fracture of right femur, periprosthetic 06/01/2014   Constipation    Cystostomy in place [facility])    Decubitus ulcer    Decubitus ulcer of ischial area    Right   Degenerative arthritis    osteoarthritis    Depression    Diabetes mellitus without complication ([place])    type 2    Edema    localized    Full incontinence of feces    GERD (gastroesophageal reflux disease)    Gout    hx of    History of falling    Hyperlipidemia    Hypertension    Hyperthyroidism    Intertrochanteric fracture (HCC)    Left, chronic   Left perineal ischial pressure ulcer 09/18/2021   Multiple sclerosis ([place])    Advanced   Neurogenic bowel    Neuromuscular disorder ([place])    Bilateral hand carpal tunnel syndrome   Neuromuscular dysfunction of bladder    Nontoxic multinodular goiter    Obesity    Osteomyelitis of coccyx ([place]) 04/05/2021   Osteoporosis    Other adrenocortical insufficiency ([place])    Paraplegia ([place])  LEGS   Personal history of  urinary (tract) infections    Polyneuropathy    Pressure ulcer of right buttock, stage 4 (HCC)    Sacral decubitus ulcer    stage IV decubitus ulcer   Sacral osteomyelitis ([place]) 04/05/2021   Spinal stenosis in cervical region    Spinal stenosis of lumbosacral region    Spinal stenosis, thoracic    Tinea unguium    Vitamin D deficiency     PAST SURGICAL HISTORY: Past Surgical History:  Procedure Laterality Date   ABDOMINAL HYSTERECTOMY     APPLICATION OF WOUND VAC  04/07/2021   Procedure: APPLICATION OF WOUND VAC;  Surgeon: Felicie Morn, MD;  Location: [facility];  Service: General;;   BACK SURGERY     BOTOX INJECTION N/A 01/30/2018   Procedure: CYSTOSCOPY BOTOX INJECTION, SUPRAPUBIC EXCHANGE;  Surgeon: Lucas Mallow, MD;  [facility]: [facility];  Service: Urology;  Laterality: N/A;   BOTOX INJECTION N/A 09/02/2018   Procedure: BOTOX INJECTION;  Surgeon: Lucas Mallow, MD;  [facility]: [facility];  Service: Urology;  Laterality: N/A;   BOTOX INJECTION N/A 05/10/2019   Procedure: CYSTOSCOPY BOTOX INJECTION, SUPRAPUBIC EXCHANGE;  Surgeon: Lucas Mallow, MD;  [facility];  Service: Urology;  Laterality: N/A;   BOTOX INJECTION N/A 09/06/2019   Procedure: CYSTOSCOPY BOTOX INJECTION;  Surgeon: Lucas Mallow, MD;  [facility]: [facility];  Service: Urology;  Laterality: N/A;   BOTOX INJECTION N/A 04/10/2020   Procedure: CYSTOSCOPY BOTOX INJECTION;  Surgeon: Festus Aloe, MD;  [facility];  Service: Urology;  Laterality: N/A;   BOTOX INJECTION N/A 09/13/2020   Procedure: CYSTOSCOPYBOTOX INJECTION SUPRAPUBIC TUBE UPSIZE AND EXCHANGE;  Surgeon: Lucas Mallow, MD;  [facility];  Service: Urology;  Laterality: N/A;   BOTOX INJECTION N/A 03/07/2021   Procedure: CYSTOSCOPY BOTOX INJECTION AND SUPRAPUBIC TUBE EXCHANGE;  Surgeon: Lucas Mallow, MD;  [facility];  Service: Urology;  Laterality: N/A;  REQUESTING 31 MINS FOR CASE   BOTOX INJECTION N/A 09/03/2021    Procedure: CYSTOSCOPY BOTOX INJECTION;  Surgeon: Lucas Mallow, MD;  [facility];  Service: Urology;  Laterality: N/A;   BOTOX INJECTION N/A 04/01/2022   Procedure: CYSTOSCOPYBOTOX INJECTION;  Surgeon: Lucas Mallow, MD;  [facility];  Service: Urology;  Laterality: N/A;   CATARACT EXTRACTION Right    CHOLECYSTECTOMY     COLON SURGERY  2023   w/ colostomy   CYSTOSCOPY N/A 09/02/2018   Procedure: CYSTOSCOPY;  Surgeon: Lucas Mallow, MD;  [facility]: [facility];  Service: Urology;  Laterality: N/A;   CYSTOSTOMY N/A 09/02/2018   Procedure: CYSTOSTOMY SUPRAPUBIC;  Surgeon: Lucas Mallow, MD;  [facility];  Service: Urology;  Laterality: N/A;  45 MINS   FEMUR IM NAIL Right 06/02/2014   Procedure: INTRAMEDULLARY (IM) RETROGRADE FEMORAL NAILING;  Surgeon: Johnny Bridge, MD;  Location: [place];  Service: Orthopedics;  Laterality: Right;   GROIN DISSECTION Left 10/24/2017   Procedure: IRRIGATION AND DEBRIDEMENT, LEFT THIGH ABCESS ;  Surgeon: Alphonsa Overall, MD;  [facility];  Service: General;  Laterality: Left;   INCISION AND DRAINAGE PERIRECTAL ABSCESS N/A 04/07/2021   Procedure: DEBRIDEMENT OF SACRAL DECUBITUS ULCER;  Surgeon: Felicie Morn, MD;  [facility];  Service: General;  Laterality: N/A;   INSERTION OF SUPRAPUBIC CATHETER N/A 09/06/2019   Procedure: SUPRAPUBIC TUBE CHANGE;  Surgeon: Lucas Mallow, MD;  [facility];  Service: Urology;  Laterality: N/A;   INSERTION OF SUPRAPUBIC CATHETER N/A 04/10/2020   Procedure: SUPRAPUBIC CATHETER EXCHANGE/TRACT  DILATION;  Surgeon: Festus Aloe, MD;  [facility];  Service: Urology;  Laterality: N/A;   INSERTION OF SUPRAPUBIC CATHETER N/A 09/03/2021   Procedure: SUPRAPUBIC CATHETER EXCHANGE;  Surgeon: Lucas Mallow, MD;  [facility];  Service: Urology;  Laterality: N/A;  45 MINS FOR THIS CASE   INSERTION OF SUPRAPUBIC CATHETER N/A 04/01/2022   Procedure: SUPRAPUBIC CATHETER  EXCHANGE UPSIZING & DILATION OF SUPRAPUBIC  TRACT;  Surgeon: Lucas Mallow, MD;  [facility];  Service: Urology;  Laterality: N/A;  45 MINS FOR CASE   IR CATHETER TUBE CHANGE  10/13/2017   IR CATHETER TUBE CHANGE  07/15/2018   IR CATHETER TUBE CHANGE  08/26/2019   IR CATHETER TUBE CHANGE  11/25/2019   IR CATHETER TUBE CHANGE  01/06/2020   IR CATHETER TUBE CHANGE  06/13/2020   IR CATHETER TUBE CHANGE  08/01/2020   LAPAROSCOPY N/A 04/07/2021   Procedure: LAPAROSCOPY DIAGNOSTIC; LAPAROSCOPIC CREATION OF TRANSVERSE COLOSTOMY;  Surgeon: Felicie Morn, MD;  [facility];  Service: General;  Laterality: N/A;   left hand carpal tunnel surgery     REPLACEMENT TOTAL KNEE BILATERAL  07/01/2002    FAMILY HISTORY: Family History  Problem Relation Age of Onset   Multiple sclerosis Other        neices.    Cancer Mother    Diabetes Mother    GI Bleed Sister        diverticulitis   Thyroid disease Neg Hx     SOCIAL HISTORY: Social History   Socioeconomic History   Marital status: Widowed    Spouse name: Not on file   Number of children: 2   Years of education: 36yrcolleg   Highest education level: Not on file  Occupational History   Occupation: N/A    Employer: [employer]   Comment: [employer]IDENT [employer]  Tobacco Use   Smoking status: Never   Smokeless tobacco: Never  Vaping Use   Vaping Use: Never used  Substance and Sexual Activity   Alcohol use: No   Drug use: No   Sexual activity: Never    Birth control/protection: Surgical  Other Topics Concern   Not on file  Social History Narrative   Patient is widowed with 2 children   Patient is right handed   Patient has 2 yrs of college   Patient drinks 2-3 cups of tea daily   Social Determinants of Health   Financial Resource Strain: Low Risk  (11/13/2017)   Overall Financial Resource Strain (CARDIA)    Difficulty of Paying Living Expenses: Not hard at all  Food Insecurity: No Food Insecurity  (11/13/2017)   Hunger Vital Sign    Worried About Running Out of Food in the Last Year: Never true    RKutztown Universityin the Last Year: Never true  Transportation Needs: No Transportation Needs (11/13/2017)   PRAPARE - THydrologist(Medical): No    Lack of Transportation (Non-Medical): No  Physical Activity: Sufficiently Active (11/13/2017)   Exercise Vital Sign    Days of Exercise per Week: 7 days    Minutes of Exercise per Session: 30 min  Stress: Stress Concern Present (11/13/2017)   [place]   Feeling of Stress : Rather much  Social Connections: Moderately Isolated (11/13/2017)   Social Connection and Isolation Panel [NHANES]    Frequency of Communication with Friends and Family: More than three times a week    Frequency of Social Gatherings with Friends and Family: More than three times a week    Attends Religious Services: Never    Marine scientist or Organizations: No    Attends Archivist Meetings: Never    Marital Status: Widowed  Intimate Partner Violence: Not At Risk (11/13/2017)   Humiliation, Afraid, Rape, and Kick questionnaire    Fear of Current or Ex-Partner: No    Emotionally Abused: No    Physically Abused: No    Sexually Abused: No       PHYSICAL EXAM  Vitals:   04/30/22 0959  BP: 116/86  Weight: 178 lb 6.4 oz (80.9 kg)  Height: '5\' 2"'$  (1.575 m)    Body mass index is 32.63 kg/m.   General: The patient is well-developed and well-nourished and in no acute distress  HEENT:  Head is [place]/AT.  Sclera are anicteric.  Funduscopic exam shows normal optic discs and retinal vessels.  Neck: No carotid bruits are noted.  The neck is nontender.  Cardiovascular: The heart has a regular rate and rhythm with a normal S1 and S2. There were no murmurs, gallops or rubs.    Skin: Extremities are without rash or  edema.  Musculoskeletal:  Back is  nontender  Neurologic Exam  Mental status: The patient is alert and oriented x 3 at the time of the examination. The patient has apparent normal recent and remote memory, with an apparently normal attention span and concentration ability.   Speech is normal.  Cranial nerves: Extraocular movements are full. Pupils are equal, round, and reactive to light and accomodation.  Visual fields are full.  Facial symmetry is present. There is good facial sensation to soft touch bilaterally.Facial strength is normal.  Trapezius and sternocleidomastoid strength is normal. No dysarthria is noted.  The tongue is midline, and the patient has symmetric elevation of the soft palate. No obvious hearing deficits are noted.  Motor: Muscle tone was mildly increased in legs.  Strength was 0/5 in the legs 4/5 in the left arm and 5/5 right arm..   Sensory: Sensory testing is intact to pinprick, soft touch and vibration in arms.  Absent touch and grealy reduced vibration sensation in legs.    Coordination: Finger-nose-finger was performed well.  She cannot do heel-to-toe  Gait and station: Marine scientist.  Unable to stand  Reflexes: Deep tendon reflexes are symmetric and spread at knees.    DIAGNOSTIC DATA (LABS, IMAGING, TESTING) - I reviewed patient records, labs, notes, testing and imaging myself where available.  Lab Results  Component Value Date   WBC 10.1 01/17/2022   HGB 12.8 01/17/2022   HCT 40.9 01/17/2022   MCV 90.7 01/17/2022   PLT 327 01/17/2022      Component Value Date/Time   NA 141 04/01/2022 1025   NA 144 03/16/2020 0000   NA 144 03/16/2020 0000   K 3.9 04/01/2022 1025   CL 100 04/01/2022 1025   CO2 30 04/01/2022 1025   GLUCOSE 124 (H) 04/01/2022 1025   BUN 21 04/01/2022 1025   BUN 14 03/16/2020 0000   BUN 14 03/16/2020 0000   CREATININE 0.50 04/01/2022 1025   CREATININE 0.56 (L) 06/08/2021 1057   CALCIUM 9.5 04/01/2022 1025   PROT 6.5 06/08/2021 1057   PROT 6.3 12/07/2019 1103  ALBUMIN 3.5 (L) 12/07/2019 1103   AST 7 (L) 06/08/2021 1057   ALT 6 06/08/2021 1057   ALKPHOS 74 12/07/2019 1103   BILITOT 0.5 06/08/2021 1057   BILITOT 0.3 12/07/2019 1103   GFRNONAA >60 04/01/2022 1025   GFRAA 90 03/16/2020 0000   GFRAA >90 03/16/2020 0000   Lab Results  Component Value Date   CHOL 156 07/30/2019   HDL 56 07/30/2019   LDLCALC 85 07/30/2019   TRIG 77 07/30/2019   Lab Results  Component Value Date   HGBA1C 4.9 09/03/2021   No results found for: "VITAMINB12" Lab Results  Component Value Date   TSH 2.34 01/27/2020       ASSESSMENT AND PLAN  Multiple sclerosis ([place])  Neurogenic bladder  Paraplegia (HCC)   Continue Tecfidera.  We will switch to the generic for insurance.    Lymphocyte count was 1.9 (normal) on recent testing 03/30/2022 Continue therapy Try to stay cognitively active and physically active as possible. Return in 12 months or sooner if there are new or worsening neurologic symptoms.  42-minute office visit with the majority of the time spent face-to-face for history and physical, discussion/counseling and decision-making.  Additional time with record review and documentation.   [place] Cutbirth A. Felecia Shelling, MD, [facility] 58/85/0277, 41:28 AM Certified in Neurology, Clinical Neurophysiology, Sleep Medicine and Neuroimaging  [facility] [address] [place], [place] [address] [phone]

## 2022-04-30 ENCOUNTER — Ambulatory Visit (INDEPENDENT_AMBULATORY_CARE_PROVIDER_SITE_OTHER): Payer: Medicare Other | Admitting: Neurology

## 2022-04-30 ENCOUNTER — Telehealth: Payer: Self-pay | Admitting: *Deleted

## 2022-04-30 ENCOUNTER — Encounter: Payer: Self-pay | Admitting: Neurology

## 2022-04-30 VITALS — BP 116/86 | Ht 62.0 in | Wt 178.4 lb

## 2022-04-30 DIAGNOSIS — G8220 Paraplegia, unspecified: Secondary | ICD-10-CM | POA: Diagnosis not present

## 2022-04-30 DIAGNOSIS — G35 Multiple sclerosis: Secondary | ICD-10-CM | POA: Diagnosis not present

## 2022-04-30 DIAGNOSIS — N319 Neuromuscular dysfunction of bladder, unspecified: Secondary | ICD-10-CM | POA: Diagnosis not present

## 2022-04-30 MED ORDER — DIMETHYL FUMARATE 240 MG PO CPDR: DELAYED_RELEASE_CAPSULE | ORAL | 4 refills | Status: DC

## 2022-04-30 NOTE — Telephone Encounter (Signed)
Submitted PA dimethyl fumarate on CMM. Key: [id number]. Waiting on determination from [employer] Part D.

## 2022-05-01 NOTE — Telephone Encounter (Signed)
Received fax from [employer] that medication is on pt plan''s list of covered drugs, no PA needed.

## 2022-05-02 ENCOUNTER — Emergency Department (HOSPITAL_COMMUNITY)
Admission: EM | Admit: 2022-05-02 | Discharge: 2022-05-03 | Disposition: A | Discharge status: Skilled Nursing Facility | Payer: Medicare Other | Attending: Emergency Medicine | Admitting: Emergency Medicine

## 2022-05-02 ENCOUNTER — Encounter (HOSPITAL_COMMUNITY): Payer: Self-pay | Admitting: Emergency Medicine

## 2022-05-02 ENCOUNTER — Other Ambulatory Visit: Payer: Self-pay

## 2022-05-02 ENCOUNTER — Emergency Department (HOSPITAL_COMMUNITY): Payer: Medicare Other

## 2022-05-02 DIAGNOSIS — R1084 Generalized abdominal pain: Secondary | ICD-10-CM

## 2022-05-02 DIAGNOSIS — R109 Unspecified abdominal pain: Secondary | ICD-10-CM | POA: Diagnosis present

## 2022-05-02 LAB — BASIC METABOLIC PANEL
Anion gap: 10 (ref 5–15)
BUN: 29 mg/dL — ABNORMAL HIGH (ref 8–23)
CO2: 32 mmol/L (ref 22–32)
Calcium: 9.6 mg/dL (ref 8.9–10.3)
Chloride: 98 mmol/L (ref 98–111)
Creatinine, Ser: 0.73 mg/dL (ref 0.44–1.00)
GFR, Estimated: >60 mL/min (ref >60)
Glucose, Bld: 145 mg/dL — ABNORMAL HIGH (ref 70–99)
Potassium: 4.6 mmol/L (ref 3.5–5.1)
Sodium: 140 mmol/L (ref 135–145)

## 2022-05-02 LAB — CBC WITH DIFFERENTIAL/PLATELET
Abs Immature Granulocytes: 0.12 10*3/uL — ABNORMAL HIGH (ref 0.00–0.07)
Basophils Absolute: 0.1 10*3/uL (ref 0.0–0.1)
Basophils Relative: 0 %
Eosinophils Absolute: 0.1 10*3/uL (ref 0.0–0.5)
Eosinophils Relative: 1 %
HCT: 41.4 % (ref 36.0–46.0)
Hemoglobin: 13 g/dL (ref 12.0–15.0)
Immature Granulocytes: 1 %
Lymphocytes Relative: 9 %
Lymphs Abs: 1.3 10*3/uL (ref 0.7–4.0)
MCH: 28.7 pg (ref 26.0–34.0)
MCHC: 31.4 g/dL (ref 30.0–36.0)
MCV: 91.4 fL (ref 80.0–100.0)
Monocytes Absolute: 0.6 10*3/uL (ref 0.1–1.0)
Monocytes Relative: 4 %
Neutro Abs: 12.1 10*3/uL — ABNORMAL HIGH (ref 1.7–7.7)
Neutrophils Relative %: 85 %
Platelets: 292 10*3/uL (ref 150–400)
RBC: 4.53 MIL/uL (ref 3.87–5.11)
RDW: 16.2 % — ABNORMAL HIGH (ref 11.5–15.5)
WBC: 14.3 10*3/uL — ABNORMAL HIGH (ref 4.0–10.5)
nRBC: 0 % (ref 0.0–0.2)

## 2022-05-02 LAB — URINALYSIS, ROUTINE W REFLEX MICROSCOPIC
Bilirubin Urine: NEGATIVE
Glucose, UA: NEGATIVE mg/dL
Hgb urine dipstick: NEGATIVE
Ketones, ur: NEGATIVE mg/dL
Nitrite: NEGATIVE
Protein, ur: 30 mg/dL — AB
Specific Gravity, Urine: <1.005 — ABNORMAL LOW (ref 1.005–1.030)
pH: >9 — ABNORMAL HIGH (ref 5.0–8.0)

## 2022-05-02 LAB — LIPASE, BLOOD: Lipase: 27 U/L (ref 11–51)

## 2022-05-02 LAB — URINALYSIS, MICROSCOPIC (REFLEX)

## 2022-05-02 MED ORDER — LACTATED RINGERS IV BOLUS
1000.0000 mL | Freq: Once | INTRAVENOUS | Status: AC
  Administered 2022-05-02: 1000 mL via INTRAVENOUS

## 2022-05-02 MED ORDER — CEPHALEXIN 500 MG PO CAPS: 500.0000 mg | ORAL_CAPSULE | Freq: Two times a day (BID) | ORAL | 0 refills | Status: AC

## 2022-05-02 MED ORDER — CEPHALEXIN 500 MG PO CAPS
500.0000 mg | ORAL_CAPSULE | Freq: Once | ORAL | Status: AC
  Administered 2022-05-02: 500 mg via ORAL
  Filled 2022-05-02: qty 1

## 2022-05-02 MED ORDER — IOHEXOL 300 MG/ML  SOLN
100.0000 mL | Freq: Once | INTRAMUSCULAR | Status: AC | PRN
  Administered 2022-05-02: 100 mL via INTRAVENOUS

## 2022-05-02 NOTE — Discharge Instructions (Signed)
You were seen today for abdominal pain.  Your work-up was grossly reassuring.  You likely have another urinary tract infection.  We will treat you with cephalexin and recommend you follow-up with your primary care provider within 48 hours.  Additionally I have talked to the urology team and they stated that they were planning for operative replacement of your suprapubic cath next week.  Given that they have recommended operative replacement, I do not believe that it is appropriate to replace here in the emergency department.  It is draining slowly and you are otherwise without emergent abnormality on labs and CT scan today.  Please follow-up with urology as discussed call them to ensure ongoing outpatient care with Dr. Gloriann Loan.  Thank for the opportunity participate in your care today, Tretha Sciara MD

## 2022-05-02 NOTE — ED Notes (Signed)
[facility] called for transport.  

## 2022-05-02 NOTE — ED Provider Notes (Signed)
[facility] DEPT Provider Note   CSN: [id number] Arrival date & time: 05/02/22  1509     History Chief Complaint  Patient presents with   Abdominal Pain    HPI Laura Roberts is a 81 y.o. female presenting for Concern for problem with her suprapubic catheter.  She states that it has not been draining over the last 3 days.  She endorses her skilled nursing facility has not been able to flush the catheter.  She states that she was told she was supposed to be having a urology appointment today but unfortunately when they called to have her sent over, she states that she was told that the doctor's office was actually closed today and that she would have to be rescheduled. She denies fevers or chills, nausea vomiting, syncope shortness of breath.  She does endorse abdominal pressure and fullness as well as abdominal pain.  This has been progressive over the past 3 days  Patient's recorded medical, surgical, social, medication list and allergies were reviewed in the Snapshot window as part of the initial history.   Review of Systems   Review of Systems  Constitutional:  Positive for fatigue. Negative for chills and fever.  HENT:  Negative for ear pain and sore throat.   Eyes:  Negative for pain and visual disturbance.  Respiratory:  Negative for cough and shortness of breath.   Cardiovascular:  Negative for chest pain and palpitations.  Gastrointestinal:  Positive for abdominal pain. Negative for vomiting.  Genitourinary:  Negative for dysuria and hematuria.  Musculoskeletal:  Negative for arthralgias and back pain.  Skin:  Negative for color change and rash.  Neurological:  Negative for seizures and syncope.  All other systems reviewed and are negative.   Physical Exam Updated Vital Signs BP (!) 145/96 (BP Location: Left Arm)   Pulse 94   Temp 97.7 F (36.5 C) (Oral)   Resp 18   SpO2 99%  Physical Exam Vitals and nursing note reviewed.   Constitutional:      General: She is not in acute distress.    Appearance: She is well-developed.  HENT:     Head: Normocephalic and atraumatic.  Eyes:     Conjunctiva/sclera: Conjunctivae normal.  Cardiovascular:     Rate and Rhythm: Normal rate and regular rhythm.     Heart sounds: No murmur heard. Pulmonary:     Effort: Pulmonary effort is normal. No respiratory distress.     Breath sounds: Normal breath sounds.  Abdominal:     General: There is no distension.     Palpations: Abdomen is soft.     Tenderness: There is no abdominal tenderness. There is no right CVA tenderness or left CVA tenderness.  Musculoskeletal:        General: No swelling or tenderness. Normal range of motion.     Cervical back: Neck supple.  Skin:    General: Skin is warm and dry.  Neurological:     General: No focal deficit present.     Mental Status: She is alert and oriented to person, place, and time. Mental status is at baseline.     Cranial Nerves: No cranial nerve deficit.      ED Course/ Medical Decision Making/ A&P Clinical Course as of 05/02/22 2244  Thu May 02, 2022  2043 Consult with urology Dr. Macie Burows who reviewed the chart and recommended not intervening on the suprapubic today as it is actively draining. [CC]  2207 UTI dc [CC]  Clinical Course User Index [CC] Tretha Sciara, MD    Procedures Procedures   Medications Ordered in ED Medications  iohexol (OMNIPAQUE) 300 MG/ML solution 100 mL (100 mLs Intravenous Contrast Given 05/02/22 1744)  lactated ringers bolus 1,000 mL (1,000 mLs Intravenous New Bag/Given 05/02/22 1942)  cephALEXin (KEFLEX) capsule 500 mg (500 mg Oral Given 05/02/22 1942)   Medical Decision Making:   Laura Roberts is a 81 y.o. female who presented to the ED today with abdominal pain, detailed above.    Handoff received from EMS.  Patient's presentation is complicated by their history of multiple comorbid medical problems.  Patient placed on  continuous vitals and telemetry monitoring while in ED which was reviewed periodically.  Complete initial physical exam performed, notably the patient  was hemodynamically stable in no acute distress.  Obvious drainage around her suprapubic site.  However there is urine inside of her catheter bag..     Reviewed and confirmed nursing documentation for past medical history, family history, social history.    Initial Assessment:   With the patient's presentation of abdominal pain, most likely diagnosis is nonspecific etiology. Other diagnoses were considered including (but not limited to) gastroenteritis, colitis, small bowel obstruction, appendicitis, cholecystitis, pancreatitis, nephrolithiasis, UTI, pyleonephritis. These are considered less likely due to history of present illness and physical exam findings.   This is most consistent with an acute life/limb threatening illness complicated by underlying chronic conditions.   Initial Plan:  CBC/CMP to evaluate for underlying infectious/metabolic etiology for patient's abdominal pain  Lipase to evaluate for pancreatitis  CTAB/Pelvis with contrast to evaluate for structural/surgical etiology of patients' severe abdominal pain.  Urinalysis and repeat physical assessment to evaluate for UTI/Pyelonpehritis  Empiric management of symptoms with escalating pain control and antiemetics as needed.   Initial Study Results:   Laboratory  All laboratory results reviewed without evidence of clinically relevant pathology.   Exceptions include: Large urine leukocytes, bacteria    Radiology All images reviewed independently. Agree with radiology report at this time.   CT ABDOMEN PELVIS W CONTRAST  Result Date: 05/02/2022 CLINICAL DATA:  Acute abdominal pain EXAM: CT ABDOMEN AND PELVIS WITH CONTRAST TECHNIQUE: Multidetector CT imaging of the abdomen and pelvis was performed using the standard protocol following bolus administration of intravenous contrast.  RADIATION DOSE REDUCTION: This exam was performed according to the departmental dose-optimization program which includes automated exposure control, adjustment of the mA and/or kV according to patient size and/or use of iterative reconstruction technique. CONTRAST:  112m OMNIPAQUE IOHEXOL 300 MG/ML  SOLN COMPARISON:  CT pelvis 10/24/2017. CT abdomen and pelvis 11/28/2005. MRI right hip 10/05/2021. CT lumbar spine 07/22/2012. FINDINGS: Lower chest: No acute abnormality. Hepatobiliary: There are some coarse calcifications in the liver near the dome which have mildly increased compared to 2007, favored as benign. The gallbladder is surgically absent. There is mild prominence of bile ducts, but this has decreased compared to prior. Pancreas: Unremarkable. No pancreatic ductal dilatation or surrounding inflammatory changes. Spleen: Normal in size without focal abnormality. Adrenals/Urinary Tract: Suprapubic Foley catheter is present. There is no hydronephrosis or perinephric fluid. The adrenal glands are within normal limits. Stomach/Bowel: Stomach is within normal limits. Appendix appears normal. No evidence of bowel wall thickening, distention, or inflammatory changes. There is diffuse colonic diverticulosis. Left lower quadrant colostomy is present. Vascular/Lymphatic: Aortic atherosclerosis. No enlarged abdominal or pelvic lymph nodes. Reproductive: Status post hysterectomy. No adnexal masses. Other: There is no ascites or free air. There is a small fat containing  umbilical hernia. Musculoskeletal: Decubitus ulcer seen overlying the distal sacrum and coccyx extending to the level of the bone where there is some underlying cortical erosion and sclerosis. No focal abscess identified. The bones are osteopenic. Severe degenerative changes affect the spine. Laminectomy defects at the level of T11-T12 present. There is grade 1 anterolisthesis at this level. The bones are diffusely osteopenic. Healed left femoral  intratrochanteric fracture present. Right femoral nail is partially visualized. IMPRESSION: 1. Stable decubitus ulcer overlying the distal sacrum and coccyx with underlying findings consistent with osteomyelitis. 2. Left lower quadrant colostomy. No bowel obstruction. 3. Colonic diverticulosis. 4. Suprapubic Foley catheter in place. Aortic Atherosclerosis (ICD10-I70.0). Electronically Signed   By: Ronney Asters M.D.   On: 05/02/2022 18:07     Consults: Case discussed with Dr. Linton Ham With urology.  He stated that Dr. Gloriann Loan was planning for operative replacement of the suprapubic catheter as long as he is draining today it can be done outpatient.  Final Reassessment and Plan:   Discussed the above with patient, she is overall well-appearing with no emergent pathology appreciated.  Urinary tract likely present given indwelling Foley.  We will treat patient with Keflex recommend she follow-up closely with primary care in the outpatient setting as well as urology as discussed.  Patient arranged for discharge with no further acute events.     Clinical Impression:  1. Generalized abdominal pain      Discharge   Final Clinical Impression(s) / ED Diagnoses Final diagnoses:  Generalized abdominal pain    Rx / DC Orders ED Discharge Orders          Ordered    cephALEXin (KEFLEX) 500 MG capsule  2 times daily        05/02/22 2241              Tretha Sciara, MD 05/02/22 2244

## 2022-05-02 NOTE — ED Triage Notes (Signed)
The patient presents from [facility] with complaints of lower abdominal pain which started 3 days ago. They believe this is due to a blockage of her suprapubic cath.     EMS vitals: 130/80 BP 94 HR 96% SPO2 on room air 195 CBG

## 2022-05-10 ENCOUNTER — Other Ambulatory Visit: Payer: Self-pay | Admitting: Urology

## 2022-05-20 NOTE — Progress Notes (Signed)
COVID Vaccine Completed:  Yes  Date of COVID positive in last 90 days:  PCP - Madison Hickman, MD Cardiologist -  Neurologist - Arlice Colt, MD  Chest x-ray -  EKG - Day of surgery  Stress Test -  ECHO -  Cardiac Cath -  Pacemaker/ICD device last checked: Spinal Cord Stimulator:  Bowel Prep -   Sleep Study -  CPAP -   Fasting Blood Sugar - 134 to 278 Checks Blood Sugar - 2  times a day  Trulicity Last dose of GLP1 agonist-  05-14-22 GLP1 instructions:  Nursing home advised to hold 7 days per surgery, do not give after 05/19/22   Last dose of SGLT-2 inhibitors-  N/A SGLT-2 instructions: N/A   Blood Thinner Instructions: Aspirin Instructions: Last Dose:  Activity level:  Patient has lower extremity paraplegia requires hoyer lift.  Resides in nursing home.  Anesthesia review:   MS, lower extremity paraplegia, HTN, DM  Patient denies shortness of breath, fever, cough and chest pain at PAT appointment  Patient verbalized understanding of instructions that were given to them at the PAT appointment. Patient was also instructed that they will need to review over the PAT instructions again at home before surgery.

## 2022-05-20 NOTE — Progress Notes (Signed)
Preop instructions for:   Laura Roberts   Date of Birth:  09/25/1940               Date of Procedure:   05-27-22 Procedure:     Cysto, suprapubic tube tract dilation, suprapubic tube exchange Surgeon: Dr. Link Snuffer Facility contact:   [facility]  Phone: [place]: RN contact name/phone#:  Neoma Laming  and Fax #: [phone]   Transportation contact phone#: [facility] [phone]  Please send day of procedure:  Current med list  Medications taken the day of procedure confirm time of nothing by mouth status Patient Demographic info( to include DNR status, problem list, allergies) Bring Insurance card and picture ID    Time to arrive at [facility]: 10:30 am   Report to: Admitting (On your left hand side)    Do not eat solid food or drink liquids past midnight the night before your procedure.(To include any tube feedings-must be discontinued)   Take these morning medications only with sips of water (or give through gastrostomy or feeding tube) Dimethyl Fumarate Duloxetine Gabapentin Methimazole Metoprolol Pantoprazole Prednisone Solifenacin Tecfidera Okay to use eyedrops Tylenol if needed Ondansetron if needed   DO NOT TAKE TRULICITY AFTER 95-09-32, 7 DAYS PRIOR TO SURGERY  The Day Before Surgery: Take 50% of Lantus the night before surgery    Note: No Insulin or Diabetic meds should be given or taken the morning of the procedure!   Leave all jewelry and other valuables at place where living( no metal or rings to be worn) No contact lens Women-no make-up, no lotions,perfumes,powders Men-no colognes,lotions   Any questions day of procedure,call  [facility]-[phone]     Sent from :[facility]                   [phone]                   Fax:[phone]   Sent by :   Norvel Richards, RN

## 2022-05-21 NOTE — Progress Notes (Signed)
Anesthesia Chart Review   Case: [id number] Date/Time: 05/27/22 1315   Procedure: CYSTOSCOPY SUPRAPUBIC TUBE TRACT DILATION AND SUPRAPUBIC TUBE EXCHANGE - 61 MINS FOR CASE   Anesthesia type: General   Pre-op diagnosis: NEUROGENIC BLADDER   Location[facility]OM 03 / [facility]   Surgeons: Lucas Mallow, MD       DISCUSSION:81 y.o. never smoker with h/o HTN, MS, DM II, neurogenic bladder scheduled for above procedure 05/27/2022 with Dr. Link Snuffer.   Pt last seen by neurology 04/30/2022. Per note pt has left arm weakness and bilateral leg weakness.  A hoyer lift is needed. Suprapubic cath and colostomy in place.  No changes made at this visit, 1 year follow up recommended.   Anticipate pt can proceed with planned procedure barring acute status change.   VS: Ht '5\' 2"'$  (1.575 m)   Wt 80.9 kg   BMI 32.61 kg/m   PROVIDERS: Elmore Guise, MD is PCP    LABS: Labs reviewed: Acceptable for surgery. (all labs ordered are listed, but only abnormal results are displayed)  Labs Reviewed - No data to display   IMAGES:   EKG:   CV:  Past Medical History:  Diagnosis Date   Age related osteoporosis    Anemia    Aphasia    Bronchitis    hx of    Cellulitis and abscess of toe    hx of    Closed supracondylar fracture of right femur, periprosthetic 06/01/2014   Constipation    Cystostomy in place [facility])    Decubitus ulcer    Decubitus ulcer of ischial area    Right   Degenerative arthritis    osteoarthritis    Depression    Diabetes mellitus without complication (HCC)    type 2    Edema    localized    Full incontinence of feces    GERD (gastroesophageal reflux disease)    Gout    hx of    History of falling    Hyperlipidemia    Hypertension    Hyperthyroidism    Intertrochanteric fracture (HCC)    Left, chronic   Left perineal ischial pressure ulcer 09/18/2021   Multiple sclerosis (HCC)    Advanced   Neurogenic bowel    Neuromuscular disorder (HCC)     Bilateral hand carpal tunnel syndrome   Neuromuscular dysfunction of bladder    Nontoxic multinodular goiter    Obesity    Osteomyelitis of coccyx ([place]) 04/05/2021   Osteoporosis    Other adrenocortical insufficiency (HCC)    Paraplegia (HCC)    LEGS   Personal history of urinary (tract) infections    Polyneuropathy    Pressure ulcer of right buttock, stage 4 (HCC)    Sacral decubitus ulcer    stage IV decubitus ulcer   Sacral osteomyelitis ([place]) 04/05/2021   Spinal stenosis in cervical region    Spinal stenosis of lumbosacral region    Spinal stenosis, thoracic    Tinea unguium    Vitamin D deficiency     Past Surgical History:  Procedure Laterality Date   ABDOMINAL HYSTERECTOMY     APPLICATION OF WOUND VAC  04/07/2021   Procedure: APPLICATION OF WOUND VAC;  Surgeon: Felicie Morn, MD;  Location: [facility];  Service: General;;   BACK SURGERY     BOTOX INJECTION N/A 01/30/2018   Procedure: CYSTOSCOPY BOTOX INJECTION, SUPRAPUBIC EXCHANGE;  Surgeon: Lucas Mallow, MD;  [facility];  Service: Urology;  Laterality: N/A;   BOTOX INJECTION N/A 09/02/2018   Procedure: BOTOX INJECTION;  Surgeon: Lucas Mallow, MD;  [facility];  Service: Urology;  Laterality: N/A;   BOTOX INJECTION N/A 05/10/2019   Procedure: CYSTOSCOPY BOTOX INJECTION, SUPRAPUBIC EXCHANGE;  Surgeon: Lucas Mallow, MD;  [facility];  Service: Urology;  Laterality: N/A;   BOTOX INJECTION N/A 09/06/2019   Procedure: CYSTOSCOPY BOTOX INJECTION;  Surgeon: Lucas Mallow, MD;  [facility];  Service: Urology;  Laterality: N/A;   BOTOX INJECTION N/A 04/10/2020   Procedure: CYSTOSCOPY BOTOX INJECTION;  Surgeon: Festus Aloe, MD;  [facility];  Service: Urology;  Laterality: N/A;   BOTOX INJECTION N/A 09/13/2020   Procedure: CYSTOSCOPYBOTOX INJECTION SUPRAPUBIC TUBE UPSIZE AND EXCHANGE;  Surgeon: Lucas Mallow, MD;  [facility];  Service: Urology;  Laterality: N/A;    BOTOX INJECTION N/A 03/07/2021   Procedure: CYSTOSCOPY BOTOX INJECTION AND SUPRAPUBIC TUBE EXCHANGE;  Surgeon: Lucas Mallow, MD;  [facility];  Service: Urology;  Laterality: N/A;  REQUESTING 46 MINS FOR CASE   BOTOX INJECTION N/A 09/03/2021   Procedure: CYSTOSCOPY BOTOX INJECTION;  Surgeon: Lucas Mallow, MD;  [facility];  Service: Urology;  Laterality: N/A;   BOTOX INJECTION N/A 04/01/2022   Procedure: CYSTOSCOPYBOTOX INJECTION;  Surgeon: Lucas Mallow, MD;  [facility];  Service: Urology;  Laterality: N/A;   CATARACT EXTRACTION Right    CHOLECYSTECTOMY     COLON SURGERY  2023   w/ colostomy   CYSTOSCOPY N/A 09/02/2018   Procedure: CYSTOSCOPY;  Surgeon: Lucas Mallow, MD;  [facility];  Service: Urology;  Laterality: N/A;   CYSTOSTOMY N/A 09/02/2018   Procedure: CYSTOSTOMY SUPRAPUBIC;  Surgeon: Lucas Mallow, MD;  [facility];  Service: Urology;  Laterality: N/A;  45 MINS   FEMUR IM NAIL Right 06/02/2014   Procedure: INTRAMEDULLARY (IM) RETROGRADE FEMORAL NAILING;  Surgeon: Johnny Bridge, MD;  Location: [place];  Service: Orthopedics;  Laterality: Right;   GROIN DISSECTION Left 10/24/2017   Procedure: IRRIGATION AND DEBRIDEMENT, LEFT THIGH ABCESS ;  Surgeon: Alphonsa Overall, MD;  [facility];  Service: General;  Laterality: Left;   INCISION AND DRAINAGE PERIRECTAL ABSCESS N/A 04/07/2021   Procedure: DEBRIDEMENT OF SACRAL DECUBITUS ULCER;  Surgeon: Felicie Morn, MD;  [facility];  Service: General;  Laterality: N/A;   INSERTION OF SUPRAPUBIC CATHETER N/A 09/06/2019   Procedure: SUPRAPUBIC TUBE CHANGE;  Surgeon: Lucas Mallow, MD;  [facility];  Service: Urology;  Laterality: N/A;   INSERTION OF SUPRAPUBIC CATHETER N/A 04/10/2020   Procedure: SUPRAPUBIC CATHETER EXCHANGE/TRACT  DILATION;  Surgeon: Festus Aloe, MD;  [facility];  Service: Urology;  Laterality: N/A;   INSERTION OF SUPRAPUBIC CATHETER N/A  09/03/2021   Procedure: SUPRAPUBIC CATHETER EXCHANGE;  Surgeon: Lucas Mallow, MD;  [facility];  Service: Urology;  Laterality: N/A;  45 MINS FOR THIS CASE   INSERTION OF SUPRAPUBIC CATHETER N/A 04/01/2022   Procedure: SUPRAPUBIC CATHETER EXCHANGE UPSIZING & DILATION OF SUPRAPUBIC  TRACT;  Surgeon: Lucas Mallow, MD;  [facility];  Service: Urology;  Laterality: N/A;  45 MINS FOR CASE   IR CATHETER TUBE CHANGE  10/13/2017   IR CATHETER TUBE CHANGE  07/15/2018   IR CATHETER TUBE CHANGE  08/26/2019   IR CATHETER TUBE CHANGE  11/25/2019   IR CATHETER TUBE CHANGE  01/06/2020   IR CATHETER TUBE CHANGE  06/13/2020   IR CATHETER TUBE CHANGE  08/01/2020   LAPAROSCOPY N/A 04/07/2021   Procedure: LAPAROSCOPY DIAGNOSTIC; LAPAROSCOPIC CREATION OF TRANSVERSE COLOSTOMY;  Surgeon: Felicie Morn, MD;  [facility];  Service: General;  Laterality: N/A;   left hand carpal tunnel surgery     REPLACEMENT TOTAL KNEE BILATERAL  07/01/2002    MEDICATIONS: No current facility-administered medications for this encounter.    acetaminophen (TYLENOL) 325 MG tablet   Amino Acids-Protein Hydrolys (FEEDING SUPPLEMENT, PRO-STAT SUGAR FREE 64,) LIQD   baclofen (LIORESAL) 10 MG tablet   baclofen (LIORESAL) 20 MG tablet   barrier cream (NON-SPECIFIED) CREA   bisacodyl (DULCOLAX) 10 MG suppository   calcium citrate (CALCITRATE - DOSED IN MG ELEMENTAL CALCIUM) 950 (200 Ca) MG tablet   Carboxymeth-Glycerin-Polysorb (REFRESH OPTIVE ADVANCED OP)   cholecalciferol (VITAMIN D) 25 MCG (1000 UNIT) tablet   cycloSPORINE (RESTASIS) 0.05 % ophthalmic emulsion   Dimethyl Fumarate 240 MG CPDR   Dulaglutide (TRULICITY) 3 GL/8.7FI SOPN   DULoxetine (CYMBALTA) 30 MG capsule   feeding supplement, GLUCERNA SHAKE, (GLUCERNA SHAKE) LIQD   ferrous sulfate 325 (65 FE) MG tablet   furosemide (LASIX) 20 MG tablet   gabapentin (NEURONTIN) 100 MG capsule   insulin glargine (LANTUS) 100 UNIT/ML Solostar Pen    loperamide (IMODIUM) 2 MG capsule   magnesium hydroxide (MILK OF MAGNESIA) 400 MG/5ML suspension   magnesium oxide (MAG-OX) 400 MG tablet   methimazole (TAPAZOLE) 5 MG tablet   metoprolol succinate (TOPROL-XL) 50 MG 24 hr tablet   Multiple Vitamin (MULTIVITAMIN WITH MINERALS) TABS tablet   ondansetron (ZOFRAN) 4 MG tablet   pantoprazole (PROTONIX) 40 MG tablet   polyethylene glycol (MIRALAX / GLYCOLAX) 17 g packet   predniSONE (DELTASONE) 2.5 MG tablet   predniSONE (DELTASONE) 5 MG tablet   SF 5000 PLUS 1.1 % CREA dental cream   Sodium Phosphates (RA SALINE ENEMA RE)   solifenacin (VESICARE) 10 MG tablet   vitamin C (ASCORBIC ACID) 500 MG tablet    [facility] Ward, PA-C [facility] [phone]

## 2022-05-27 ENCOUNTER — Ambulatory Visit (HOSPITAL_BASED_OUTPATIENT_CLINIC_OR_DEPARTMENT_OTHER): Payer: Medicare Other | Admitting: Physician Assistant

## 2022-05-27 ENCOUNTER — Encounter (HOSPITAL_COMMUNITY)
Admission: RE | Disposition: A | Discharge status: Home / Self Care | Payer: Self-pay | Source: Home / Self Care | Attending: Urology

## 2022-05-27 ENCOUNTER — Ambulatory Visit (HOSPITAL_COMMUNITY)
Admission: RE | Admit: 2022-05-27 | Discharge: 2022-05-27 | Disposition: A | Discharge status: Home / Self Care | Payer: Medicare Other | Attending: Urology | Admitting: Urology

## 2022-05-27 ENCOUNTER — Ambulatory Visit (HOSPITAL_COMMUNITY): Payer: Medicare Other | Admitting: Physician Assistant

## 2022-05-27 ENCOUNTER — Encounter (HOSPITAL_COMMUNITY): Payer: Self-pay | Admitting: Urology

## 2022-05-27 DIAGNOSIS — I10 Essential (primary) hypertension: Secondary | ICD-10-CM | POA: Insufficient documentation

## 2022-05-27 DIAGNOSIS — G35 Multiple sclerosis: Secondary | ICD-10-CM | POA: Insufficient documentation

## 2022-05-27 DIAGNOSIS — Z01818 Encounter for other preprocedural examination: Secondary | ICD-10-CM

## 2022-05-27 DIAGNOSIS — Z435 Encounter for attention to cystostomy: Secondary | ICD-10-CM | POA: Diagnosis not present

## 2022-05-27 DIAGNOSIS — E119 Type 2 diabetes mellitus without complications: Secondary | ICD-10-CM

## 2022-05-27 DIAGNOSIS — I2510 Atherosclerotic heart disease of native coronary artery without angina pectoris: Secondary | ICD-10-CM

## 2022-05-27 DIAGNOSIS — N319 Neuromuscular dysfunction of bladder, unspecified: Secondary | ICD-10-CM | POA: Diagnosis present

## 2022-05-27 DIAGNOSIS — Z833 Family history of diabetes mellitus: Secondary | ICD-10-CM | POA: Insufficient documentation

## 2022-05-27 DIAGNOSIS — K219 Gastro-esophageal reflux disease without esophagitis: Secondary | ICD-10-CM | POA: Diagnosis not present

## 2022-05-27 DIAGNOSIS — Z794 Long term (current) use of insulin: Secondary | ICD-10-CM | POA: Diagnosis not present

## 2022-05-27 DIAGNOSIS — Z7985 Long-term (current) use of injectable non-insulin antidiabetic drugs: Secondary | ICD-10-CM | POA: Insufficient documentation

## 2022-05-27 DIAGNOSIS — E0590 Thyrotoxicosis, unspecified without thyrotoxic crisis or storm: Secondary | ICD-10-CM | POA: Diagnosis not present

## 2022-05-27 DIAGNOSIS — G709 Myoneural disorder, unspecified: Secondary | ICD-10-CM | POA: Diagnosis not present

## 2022-05-27 DIAGNOSIS — M1990 Unspecified osteoarthritis, unspecified site: Secondary | ICD-10-CM | POA: Diagnosis not present

## 2022-05-27 DIAGNOSIS — E1149 Type 2 diabetes mellitus with other diabetic neurological complication: Secondary | ICD-10-CM | POA: Diagnosis not present

## 2022-05-27 HISTORY — PX: INSERTION OF SUPRAPUBIC CATHETER: SHX5870

## 2022-05-27 LAB — SURGICAL PCR SCREEN
MRSA, PCR: POSITIVE — AB
Staphylococcus aureus: POSITIVE — AB

## 2022-05-27 LAB — GLUCOSE, CAPILLARY
Glucose-Capillary: 94 mg/dL (ref 70–99)
Glucose-Capillary: 93 mg/dL (ref 70–99)

## 2022-05-27 SURGERY — INSERTION, SUPRAPUBIC CATHETER
Anesthesia: General | Site: Abdomen

## 2022-05-27 MED ORDER — SODIUM CHLORIDE 0.9 % IR SOLN
Status: DC | PRN
  Administered 2022-05-27: 6000 mL

## 2022-05-27 MED ORDER — ORAL CARE MOUTH RINSE: 15.0000 mL | Freq: Once | OROMUCOSAL | Status: AC

## 2022-05-27 MED ORDER — FENTANYL CITRATE PF 50 MCG/ML IJ SOSY
25.0000 ug | PREFILLED_SYRINGE | INTRAMUSCULAR | Status: DC | PRN
  Administered 2022-05-27 (×2): 25 ug via INTRAVENOUS

## 2022-05-27 MED ORDER — FENTANYL CITRATE PF 50 MCG/ML IJ SOSY
PREFILLED_SYRINGE | INTRAMUSCULAR | Status: AC
  Filled 2022-05-27: qty 1

## 2022-05-27 MED ORDER — LIDOCAINE 2% (20 MG/ML) 5 ML SYRINGE
INTRAMUSCULAR | Status: DC | PRN
  Administered 2022-05-27: 60 mg via INTRAVENOUS

## 2022-05-27 MED ORDER — FENTANYL CITRATE (PF) 100 MCG/2ML IJ SOLN
INTRAMUSCULAR | Status: DC | PRN
  Administered 2022-05-27 (×2): 25 ug via INTRAVENOUS

## 2022-05-27 MED ORDER — LIDOCAINE HCL (PF) 2 % IJ SOLN
INTRAMUSCULAR | Status: AC
  Filled 2022-05-27: qty 5

## 2022-05-27 MED ORDER — FENTANYL CITRATE (PF) 100 MCG/2ML IJ SOLN
INTRAMUSCULAR | Status: AC
  Filled 2022-05-27: qty 2

## 2022-05-27 MED ORDER — PROPOFOL 10 MG/ML IV BOLUS
INTRAVENOUS | Status: AC
  Filled 2022-05-27: qty 20

## 2022-05-27 MED ORDER — BUPIVACAINE HCL (PF) 0.5 % IJ SOLN
INTRAMUSCULAR | Status: AC
  Filled 2022-05-27: qty 30

## 2022-05-27 MED ORDER — STERILE WATER FOR IRRIGATION IR SOLN
Status: DC | PRN
  Administered 2022-05-27: 250 mL

## 2022-05-27 MED ORDER — SODIUM CHLORIDE 0.9 % IV SOLN
1.0000 g | Freq: Three times a day (TID) | INTRAVENOUS | Status: DC
  Administered 2022-05-27: 1 g via INTRAVENOUS
  Filled 2022-05-27: qty 20

## 2022-05-27 MED ORDER — PROPOFOL 10 MG/ML IV BOLUS
INTRAVENOUS | Status: DC | PRN
  Administered 2022-05-27: 200 mg via INTRAVENOUS

## 2022-05-27 MED ORDER — 0.9 % SODIUM CHLORIDE (POUR BTL) OPTIME
TOPICAL | Status: DC | PRN
  Administered 2022-05-27: 1000 mL

## 2022-05-27 MED ORDER — ONDANSETRON HCL 4 MG/2ML IJ SOLN
INTRAMUSCULAR | Status: DC | PRN
  Administered 2022-05-27: 4 mg via INTRAVENOUS

## 2022-05-27 MED ORDER — LACTATED RINGERS IV SOLN: INTRAVENOUS | Status: DC

## 2022-05-27 MED ORDER — CHLORHEXIDINE GLUCONATE 0.12 % MT SOLN
15.0000 mL | Freq: Once | OROMUCOSAL | Status: AC
  Administered 2022-05-27: 15 mL via OROMUCOSAL

## 2022-05-27 MED ORDER — PHENYLEPHRINE HCL-NACL 20-0.9 MG/250ML-% IV SOLN
INTRAVENOUS | Status: DC | PRN
  Administered 2022-05-27: 35 ug/min via INTRAVENOUS

## 2022-05-27 MED ORDER — ONDANSETRON HCL 4 MG/2ML IJ SOLN
INTRAMUSCULAR | Status: AC
  Filled 2022-05-27: qty 2

## 2022-05-27 SURGICAL SUPPLY — 31 items
BAG COUNTER SPONGE SURGICOUNT (BAG) IMPLANT
BAG DRN RND TRDRP ANRFLXCHMBR (UROLOGICAL SUPPLIES) ×1
BAG SPNG CNTER NS LX DISP (BAG)
BAG URINE DRAIN 2000ML AR STRL (UROLOGICAL SUPPLIES) ×1 IMPLANT
BAG URINE LEG 500ML (DRAIN) ×1 IMPLANT
BLADE SURG 15 STRL LF DISP TIS (BLADE) ×1 IMPLANT
BLADE SURG 15 STRL SS (BLADE) ×1
CATH FOLEY 2W COUNCIL 20FR 5CC (CATHETERS) IMPLANT
CATH FOLEY 2WAY SLVR  5CC 20FR (CATHETERS) ×1
CATH FOLEY 2WAY SLVR 5CC 20FR (CATHETERS) ×1 IMPLANT
CATH URETL OPEN 5X70 (CATHETERS) IMPLANT
DRESSING MEPILEX FLEX 4X4 (GAUZE/BANDAGES/DRESSINGS) IMPLANT
DRSG MEPILEX FLEX 4X4 (GAUZE/BANDAGES/DRESSINGS) ×1
ELECT REM PT RETURN 15FT ADLT (MISCELLANEOUS) ×1 IMPLANT
GAUZE SPONGE 4X4 12PLY STRL (GAUZE/BANDAGES/DRESSINGS) IMPLANT
GLOVE SURG LX STRL 7.5 STRW (GLOVE) ×1 IMPLANT
GLOVE SURG SS PI 8.0 STRL IVOR (GLOVE) ×1 IMPLANT
GOWN STRL REUS W/ TWL XL LVL3 (GOWN DISPOSABLE) ×2 IMPLANT
GOWN STRL REUS W/TWL XL LVL3 (GOWN DISPOSABLE) ×2
GUIDEWIRE STR DUAL SENSOR (WIRE) IMPLANT
KIT TURNOVER KIT A (KITS) IMPLANT
MANIFOLD NEPTUNE II (INSTRUMENTS) ×1 IMPLANT
NEEDLE HYPO 22GX1.5 SAFETY (NEEDLE) ×1 IMPLANT
PACK CYSTO (CUSTOM PROCEDURE TRAY) ×1 IMPLANT
PENCIL SMOKE EVACUATOR (MISCELLANEOUS) IMPLANT
PLUG CATH AND CAP STER (CATHETERS) ×1 IMPLANT
SPONGE DRAIN TRACH 4X4 STRL 2S (GAUZE/BANDAGES/DRESSINGS) ×1 IMPLANT
SUT ETHILON 2 0 PS N (SUTURE) ×1 IMPLANT
SUT SILK 2 0 SH (SUTURE) IMPLANT
SYR 10ML LL (SYRINGE) IMPLANT
TOWEL OR 17X26 10 PK STRL BLUE (TOWEL DISPOSABLE) ×1 IMPLANT

## 2022-05-27 NOTE — Discharge Instructions (Signed)
Exchange your suprapubic tube monthly

## 2022-05-27 NOTE — H&P (Signed)
H&P  Chief Complaint: Neurogenic bladder  History of Present Illness: 81 year old female managed with suprapubic catheter for neurogenic bladder as well as intravesical Botox.  She has been having issues with her suprapubic catheter.  Very difficult catheter exchange in the operating room on 10/2.  She presents for suprapubic tube exchange in the operating room.  Her catheter of note has not really been draining and not flushing.  She now has a urethral catheter as well.  Past Medical History:  Diagnosis Date   Age related osteoporosis    Anemia    Aphasia    Bronchitis    hx of    Cellulitis and abscess of toe    hx of    Closed supracondylar fracture of right femur, periprosthetic 06/01/2014   Constipation    Cystostomy in place [facility])    Decubitus ulcer    Decubitus ulcer of ischial area    Right   Degenerative arthritis    osteoarthritis    Depression    Diabetes mellitus without complication (HCC)    type 2    Edema    localized    Full incontinence of feces    GERD (gastroesophageal reflux disease)    Gout    hx of    History of falling    Hyperlipidemia    Hypertension    Hyperthyroidism    Intertrochanteric fracture (HCC)    Left, chronic   Left perineal ischial pressure ulcer 09/18/2021   Multiple sclerosis (HCC)    Advanced   Neurogenic bowel    Neuromuscular disorder (HCC)    Bilateral hand carpal tunnel syndrome   Neuromuscular dysfunction of bladder    Nontoxic multinodular goiter    Obesity    Osteomyelitis of coccyx ([place]) 04/05/2021   Osteoporosis    Other adrenocortical insufficiency (HCC)    Paraplegia (HCC)    LEGS   Personal history of urinary (tract) infections    Polyneuropathy    Pressure ulcer of right buttock, stage 4 (HCC)    Sacral decubitus ulcer    stage IV decubitus ulcer   Sacral osteomyelitis ([place]) 04/05/2021   Spinal stenosis in cervical region    Spinal stenosis of lumbosacral region    Spinal stenosis, thoracic    Tinea  unguium    Vitamin D deficiency    Past Surgical History:  Procedure Laterality Date   ABDOMINAL HYSTERECTOMY     APPLICATION OF WOUND VAC  04/07/2021   Procedure: APPLICATION OF WOUND VAC;  Surgeon: Felicie Morn, MD;  Location: [facility];  Service: General;;   BACK SURGERY     BOTOX INJECTION N/A 01/30/2018   Procedure: CYSTOSCOPY BOTOX INJECTION, SUPRAPUBIC EXCHANGE;  Surgeon: Lucas Mallow, MD;  Location: [facility];  Service: Urology;  Laterality: N/A;   BOTOX INJECTION N/A 09/02/2018   Procedure: BOTOX INJECTION;  Surgeon: Lucas Mallow, MD;  Location: [facility];  Service: Urology;  Laterality: N/A;   BOTOX INJECTION N/A 05/10/2019   Procedure: CYSTOSCOPY BOTOX INJECTION, SUPRAPUBIC EXCHANGE;  Surgeon: Lucas Mallow, MD;  Location[facility];  Service: Urology;  Laterality: N/A;   BOTOX INJECTION N/A 09/06/2019   Procedure: CYSTOSCOPY BOTOX INJECTION;  Surgeon: Lucas Mallow, MD;  Location: [facility];  Service: Urology;  Laterality: N/A;   BOTOX INJECTION N/A 04/10/2020   Procedure: CYSTOSCOPY BOTOX INJECTION;  Surgeon: Festus Aloe, MD;  [facility];  Service: Urology;  Laterality: N/A;   BOTOX INJECTION N/A 09/13/2020  Procedure: CYSTOSCOPYBOTOX INJECTION SUPRAPUBIC TUBE UPSIZE AND EXCHANGE;  Surgeon: Lucas Mallow, MD;  [facility];  Service: Urology;  Laterality: N/A;   BOTOX INJECTION N/A 03/07/2021   Procedure: CYSTOSCOPY BOTOX INJECTION AND SUPRAPUBIC TUBE EXCHANGE;  Surgeon: Lucas Mallow, MD;  Location: [facility];  Service: Urology;  Laterality: N/A;  REQUESTING 37 MINS FOR CASE   BOTOX INJECTION N/A 09/03/2021   Procedure: CYSTOSCOPY BOTOX INJECTION;  Surgeon: Lucas Mallow, MD;  [facility];  Service: Urology;  Laterality: N/A;   BOTOX INJECTION N/A 04/01/2022   Procedure: CYSTOSCOPYBOTOX INJECTION;  Surgeon: Lucas Mallow, MD;  [facility];  Service: Urology;  Laterality: N/A;   CATARACT EXTRACTION Right     CHOLECYSTECTOMY     COLON SURGERY  2023   w/ colostomy   CYSTOSCOPY N/A 09/02/2018   Procedure: CYSTOSCOPY;  Surgeon: Lucas Mallow, MD;  [facility];  Service: Urology;  Laterality: N/A;   CYSTOSTOMY N/A 09/02/2018   Procedure: CYSTOSTOMY SUPRAPUBIC;  Surgeon: Lucas Mallow, MD;  [facility];  Service: Urology;  Laterality: N/A;  45 MINS   FEMUR IM NAIL Right 06/02/2014   Procedure: INTRAMEDULLARY (IM) RETROGRADE FEMORAL NAILING;  Surgeon: Johnny Bridge, MD;  Location: [place];  Service: Orthopedics;  Laterality: Right;   GROIN DISSECTION Left 10/24/2017   Procedure: IRRIGATION AND DEBRIDEMENT, LEFT THIGH ABCESS ;  Surgeon: Alphonsa Overall, MD;  [facility];  Service: General;  Laterality: Left;   INCISION AND DRAINAGE PERIRECTAL ABSCESS N/A 04/07/2021   Procedure: DEBRIDEMENT OF SACRAL DECUBITUS ULCER;  Surgeon: Felicie Morn, MD;  [facility];  Service: General;  Laterality: N/A;   INSERTION OF SUPRAPUBIC CATHETER N/A 09/06/2019   Procedure: SUPRAPUBIC TUBE CHANGE;  Surgeon: Lucas Mallow, MD;  [facility];  Service: Urology;  Laterality: N/A;   INSERTION OF SUPRAPUBIC CATHETER N/A 04/10/2020   Procedure: SUPRAPUBIC CATHETER EXCHANGE/TRACT  DILATION;  Surgeon: Festus Aloe, MD;  [facility];  Service: Urology;  Laterality: N/A;   INSERTION OF SUPRAPUBIC CATHETER N/A 09/03/2021   Procedure: SUPRAPUBIC CATHETER EXCHANGE;  Surgeon: Lucas Mallow, MD;  [facility];  Service: Urology;  Laterality: N/A;  45 MINS FOR THIS CASE   INSERTION OF SUPRAPUBIC CATHETER N/A 04/01/2022   Procedure: SUPRAPUBIC CATHETER EXCHANGE UPSIZING & DILATION OF SUPRAPUBIC  TRACT;  Surgeon: Lucas Mallow, MD;  [facility]: [facility];  Service: Urology;  Laterality: N/A;  45 MINS FOR CASE   IR CATHETER TUBE CHANGE  10/13/2017   IR CATHETER TUBE CHANGE  07/15/2018   IR CATHETER TUBE CHANGE  08/26/2019   IR CATHETER TUBE CHANGE  11/25/2019   IR CATHETER TUBE  CHANGE  01/06/2020   IR CATHETER TUBE CHANGE  06/13/2020   IR CATHETER TUBE CHANGE  08/01/2020   LAPAROSCOPY N/A 04/07/2021   Procedure: LAPAROSCOPY DIAGNOSTIC; LAPAROSCOPIC CREATION OF TRANSVERSE COLOSTOMY;  Surgeon: Felicie Morn, MD;  [facility];  Service: General;  Laterality: N/A;   left hand carpal tunnel surgery     REPLACEMENT TOTAL KNEE BILATERAL  07/01/2002    Home Medications:  Medications Prior to Admission  Medication Sig Dispense Refill Last Dose   acetaminophen (TYLENOL) 325 MG tablet Take 650 mg by mouth every 6 (six) hours as needed for mild pain or fever.    05/26/2022 at 2100   Amino Acids-Protein Hydrolys (FEEDING SUPPLEMENT, PRO-STAT SUGAR FREE 64,) LIQD Take  30 mLs by mouth 2 (two) times daily. (0900 & 1700)   05/26/2022 at 2100   baclofen (LIORESAL) 10 MG tablet Take 10-20 mg by mouth See admin instructions. Take 15 mg) by mouth in the morning, 10 mg midday for spasms & 20 mg by mouth at bedtime   05/26/2022 at 2100   barrier cream (NON-SPECIFIED) CREA Apply 1 application topically 2 (two) times daily. (0700 & 1900) Apply to right ischium and bilateral buttocks   05/26/2022   calcium citrate (CALCITRATE - DOSED IN MG ELEMENTAL CALCIUM) 950 (200 Ca) MG tablet Take 200 mg of elemental calcium by mouth daily. (0800)   Past Month   Carboxymeth-Glycerin-Polysorb (REFRESH OPTIVE ADVANCED OP) Apply 1 drop to eye 4 (four) times daily as needed.   Past Month   cholecalciferol (VITAMIN D) 25 MCG (1000 UNIT) tablet Take 1,000 Units by mouth daily. (1000)   Past Month   Dimethyl Fumarate 240 MG CPDR One po bid (Patient taking differently: One po bid, takes trulicity) 169 capsule 4 05/26/2022   Dulaglutide (TRULICITY) 3 CV/8.9FY SOPN Inject 3 mg into the skin every Tuesday. (1000)   05/21/2022   DULoxetine (CYMBALTA) 30 MG capsule Take 3 capsules (90 mg total) by mouth 2 (two) times daily. (0800)Give along with 30 mg to = 90 mg (Patient taking differently: Take 90 mg by  mouth daily. (1000))   05/26/2022   ferrous sulfate 325 (65 FE) MG tablet Take 325 mg by mouth 2 (two) times daily with a meal. (1000 & 2200)   Past Month   furosemide (LASIX) 20 MG tablet Take 1 tablet (20 mg total) by mouth every other day. (Patient taking differently: Take 20 mg by mouth every other day. 10:00) 30 tablet  05/26/2022   gabapentin (NEURONTIN) 100 MG capsule Take 200 mg by mouth 3 (three) times daily. (0800, 1400 & 2000)   05/26/2022   methimazole (TAPAZOLE) 5 MG tablet Take 1 tablet (5 mg total) by mouth daily. (Patient taking differently: Take 5 mg by mouth daily at 6 (six) AM. (0600)) 30 tablet 5 05/27/2022 at 0515   metoprolol succinate (TOPROL-XL) 50 MG 24 hr tablet Take 50 mg by mouth daily after breakfast. (0800)   05/27/2022 at 0900   Multiple Vitamin (MULTIVITAMIN WITH MINERALS) TABS tablet Take 1 tablet by mouth in the morning. (1000)   Past Month   pantoprazole (PROTONIX) 40 MG tablet Take 40 mg by mouth daily at 6 (six) AM. (0600)   05/27/2022 at 0515   predniSONE (DELTASONE) 2.5 MG tablet Take 2.5 mg by mouth every other day. (1000) Alternating days with 5 mg dose   05/26/2022   predniSONE (DELTASONE) 5 MG tablet Take 5 mg by mouth every other day. (10:00) Alternating days with 2.5 mg dose   Past Week   SF 5000 PLUS 1.1 % CREA dental cream Place 1 application  onto teeth daily at 8 pm. (2000) Pea size  Spit our excess and do not rinse water   Past Week   vitamin C (ASCORBIC ACID) 500 MG tablet Take 500 mg by mouth in the morning and at bedtime. (1000 & 2200)   Past Month   bisacodyl (DULCOLAX) 10 MG suppository Place 10 mg rectally daily as needed for moderate constipation (constipation not relieved by milk of magnesium).      cycloSPORINE (RESTASIS) 0.05 % ophthalmic emulsion Place 1 drop into both eyes at bedtime. (2000)   More than a month   feeding supplement, GLUCERNA SHAKE, ([place]  SHAKE) LIQD Take 237 mLs by mouth 2 (two) times daily between meals.  0 More than  a month   insulin glargine (LANTUS) 100 UNIT/ML Solostar Pen Inject 4 Units into the skin at bedtime. (2000)      loperamide (IMODIUM) 2 MG capsule Take 2 mg by mouth daily as needed for diarrhea or loose stools (SUBSEQUENT DOSE GIVE ONE TAB AFTER EACH STOOL (max 8 mg/24 hrs.)).   More than a month   magnesium hydroxide (MILK OF MAGNESIA) 400 MG/5ML suspension Take 30 mLs by mouth daily as needed for mild constipation.   More than a month   magnesium oxide (MAG-OX) 400 MG tablet Take 400 mg by mouth 2 (two) times daily. (1000 & 2200)   More than a month   ondansetron (ZOFRAN) 4 MG tablet Take 4 mg by mouth every 6 (six) hours as needed for nausea or vomiting.   More than a month   polyethylene glycol (MIRALAX / GLYCOLAX) 17 g packet Take 17 g by mouth daily as needed. (Patient taking differently: Take 17 g by mouth daily. (1000)) 14 each 0 More than a month   Sodium Phosphates (RA SALINE ENEMA RE) Place 1 Bottle rectally daily as needed (severe constipation (not relieved by bisacodyl)).   More than a month   solifenacin (VESICARE) 10 MG tablet Take 10 mg by mouth daily. (0800)   Unknown   Allergies:  Allergies  Allergen Reactions   Codeine Shortness Of Breath and Other (See Comments)    Difficult breathing and skin problem   Ultram [Tramadol] Shortness Of Breath and Other (See Comments)    Difficult breathing and skin peeling   Januvia [Sitagliptin] Rash and Other (See Comments)    Blisters    Family History  Problem Relation Age of Onset   Multiple sclerosis Other        neices.    Cancer Mother    Diabetes Mother    GI Bleed Sister        diverticulitis   Thyroid disease Neg Hx    Social History:  reports that she has never smoked. She has never used smokeless tobacco. She reports that she does not drink alcohol and does not use drugs.  ROS: A complete review of systems was performed.  All systems are negative except for pertinent findings as noted. ROS   Physical Exam:   Vital signs in last 24 hours: Temp:  [98.1 F (36.7 C)] 98.1 F (36.7 C) (11/27 1034) Pulse Rate:  [90] 90 (11/27 1034) Resp:  [20] 20 (11/27 1034) BP: (147)/(71) 147/71 (11/27 1034) SpO2:  [97 %] 97 % (11/27 1034) Weight:  [80.9 kg] 80.9 kg (11/27 1047) General:  Alert and oriented, No acute distress HEENT: Normocephalic, atraumatic Neck: No JVD or lymphadenopathy Cardiovascular: Regular rate and rhythm Lungs: Regular rate and effort Abdomen: Soft, nontender, nondistended, no abdominal masses   Laboratory Data:  Results for orders placed or performed during the hospital encounter of 05/27/22 (from the past 24 hour(s))  Glucose, capillary     Status: None   Collection Time: 05/27/22 11:09 AM  Result Value Ref Range   Glucose-Capillary 94 70 - 99 mg/dL   No results found for this or any previous visit (from the past 240 hour(s)). Creatinine: No results for input(s): "CREATININE" in the last 168 hours.  Impression/Assessment:  Urinary retention secondary to neurogenic bladder  Plan:  Proceed to the operating room for attempted exchange and upsizing.  I informed her that if  we are unable to access the bladder from the suprapubic tube being displaced, she will need to be scheduled with interventional radiology.  Her CT looks like the suprapubic tube was then placed in the bladder.  Marton Redwood, III 05/27/2022, 11:56 AM

## 2022-05-27 NOTE — Anesthesia Procedure Notes (Signed)
Procedure Name: LMA Insertion Date/Time: 05/27/2022 12:27 PM  Performed by: Maxwell Caul, CRNAPre-anesthesia Checklist: Patient identified, Emergency Drugs available, Suction available and Patient being monitored Patient Re-evaluated:Patient Re-evaluated prior to induction Oxygen Delivery Method: Circle system utilized Preoxygenation: Pre-oxygenation with 100% oxygen Induction Type: IV induction LMA: LMA with gastric port inserted LMA Size: 4.0 Number of attempts: 1 Placement Confirmation: positive ETCO2 and breath sounds checked- equal and bilateral Tube secured with: Tape Dental Injury: Teeth and Oropharynx as per pre-operative assessment

## 2022-05-27 NOTE — Anesthesia Postprocedure Evaluation (Signed)
Anesthesia Post Note  Patient: Laura Roberts  Procedure(s) Performed: CYSTOSCOPY SUPRAPUBIC TUBE TRACT DILATION AND SUPRAPUBIC TUBE EXCHANGE (Abdomen)     Patient location during evaluation: PACU Anesthesia Type: General Level of consciousness: awake Pain management: pain level controlled Vital Signs Assessment: post-procedure vital signs reviewed and stable Respiratory status: spontaneous breathing Cardiovascular status: stable Postop Assessment: no apparent nausea or vomiting Anesthetic complications: no   No notable events documented.  Last Vitals:  Vitals:   05/27/22 1415 05/27/22 1428  BP: 117/68 138/65  Pulse: 82 82  Resp: 14 16  Temp: 36.5 C 36.5 C  SpO2: 96% 93%    Last Pain:  Vitals:   05/27/22 1428  TempSrc:   PainSc: 0-No pain                 Tanasha Menees

## 2022-05-27 NOTE — Op Note (Signed)
Operative Note  Preoperative diagnosis:  1.  Neurogenic bladder  Postoperative diagnosis: 1.  Neurogenic bladder  Procedure(s): 1.  Cystoscopy 2.  Suprapubic tube tract dilation with complicated suprapubic tube exchange  Surgeon: Link Snuffer, MD  Assistants: None  Anesthesia: General  Complications: None immediate  EBL: Minimal  Specimens: 1.  None  Drains/Catheters: 1.  [place] council tip Foley catheter  Intraoperative findings: 1.  Wide open urethra, unchanged 2.  Small capacity bladder with catheter associated edema but no obvious papillary tumor or stones 3.  Successful upsize to 45 [place]  Indication: 81 year old female with neurogenic bladder managed with suprapubic tube presents for upsizing and exchange.   Description of procedure:  The patient was identified and consent was obtained.  The patient was taken to the operating room and placed in the supine position.  The patient was placed under general anesthesia.  Perioperative antibiotics were administered.  The patient was placed in dorsal lithotomy.  Patient was prepped and draped in a standard sterile fashion and a timeout was performed.  A 21 French rigid cystoscope was advanced into the urethra and into the bladder.  The suprapubic tube was present within the bladder.  I attempted to advance a wire through the council tip catheter into the bladder but this met resistance.  I therefore deflated the balloon and removed the catheter.  I then advanced a sensor wire through the suprapubic tube tract.  I used a grasper to grasp the wire and pull it out of the urethra.  A clamp was placed on this.  I then dilated with female sounds from 70 [place] up to 22 [place].  I then advanced a 83 [place] council tip catheter over the wire and into the bladder.  I instilled 10 cc of sterile water into the catheter balloon.  I confirmed placement with cystoscopy.  There was good drainage of the catheter as well.  I withdrew the scope and  this concluded the procedure.  Patient tolerated the procedure well was stable postoperative.  Plan: Proceed with monthly suprapubic tube changes

## 2022-05-27 NOTE — Anesthesia Preprocedure Evaluation (Signed)
Anesthesia Evaluation  Patient identified by MRN, date of birth, ID band Patient awake    Reviewed: Allergy & Precautions, NPO status , Patient's Chart, lab work & pertinent test results  Airway Mallampati: II       Dental   Pulmonary    breath sounds clear to auscultation       Cardiovascular hypertension,  Rhythm:Regular Rate:Normal     Neuro/Psych  Neuromuscular disease    GI/Hepatic Neg liver ROS,GERD  ,,  Endo/Other  diabetes Hyperthyroidism   Renal/GU negative Renal ROS     Musculoskeletal  (+) Arthritis ,    Abdominal   Peds  Hematology   Anesthesia Other Findings   Reproductive/Obstetrics                             Anesthesia Physical Anesthesia Plan  ASA: 3  Anesthesia Plan: General   Post-op Pain Management: Tylenol PO (pre-op)*   Induction: Intravenous  PONV Risk Score and Plan: 3 and Ondansetron, Dexamethasone and Midazolam  Airway Management Planned: LMA  Additional Equipment:   Intra-op Plan:   Post-operative Plan: Extubation in OR  Informed Consent: I have reviewed the patients History and Physical, chart, labs and discussed the procedure including the risks, benefits and alternatives for the proposed anesthesia with the patient or authorized representative who has indicated his/her understanding and acceptance.     Dental advisory given  Plan Discussed with: CRNA and Anesthesiologist  Anesthesia Plan Comments:        Anesthesia Quick Evaluation

## 2022-05-27 NOTE — Progress Notes (Signed)
Pharmacy Antibiotic Note  Laura Roberts is a 81 y.o. female admitted on 05/27/2022 with bacteremia.  Pharmacy has been consulted for meropenem dosing.  Plan: Meropenem 1 gr IV q8h   Height: '5\' 2"'$  (157.5 cm) Weight: 80.9 kg (178 lb 6.4 oz) IBW/kg (Calculated) : 50.1  Temp (24hrs), Avg:98.1 F (36.7 C), Min:98.1 F (36.7 C), Max:98.1 F (36.7 C)  No results for input(s): "WBC", "CREATININE", "LATICACIDVEN", "VANCOTROUGH", "VANCOPEAK", "VANCORANDOM", "GENTTROUGH", "GENTPEAK", "GENTRANDOM", "TOBRATROUGH", "TOBRAPEAK", "TOBRARND", "AMIKACINPEAK", "AMIKACINTROU", "AMIKACIN" in the last 168 hours.  CrCl cannot be calculated (Patient's most recent lab result is older than the maximum 21 days allowed.).    Allergies  Allergen Reactions   Codeine Shortness Of Breath and Other (See Comments)    Difficult breathing and skin problem   Ultram [Tramadol] Shortness Of Breath and Other (See Comments)    Difficult breathing and skin peeling   Januvia [Sitagliptin] Rash and Other (See Comments)    Blisters       Thank you for allowing pharmacy to be a part of this patient's care.   Royetta Asal, PharmD, BCPS 05/27/2022 10:48 AM

## 2022-05-27 NOTE — Transfer of Care (Signed)
Immediate Anesthesia Transfer of Care Note  Patient: Laura Roberts  Procedure(s) Performed: CYSTOSCOPY SUPRAPUBIC TUBE TRACT DILATION AND SUPRAPUBIC TUBE EXCHANGE (Abdomen)  Patient Location: PACU  Anesthesia Type:General  Level of Consciousness: awake, alert , and oriented  Airway & Oxygen Therapy: Patient Spontanous Breathing and Patient connected to face mask oxygen  Post-op Assessment: Report given to RN and Post -op Vital signs reviewed and stable  Post vital signs: Reviewed and stable  Last Vitals:  Vitals Value Taken Time  BP    Temp    Pulse    Resp    SpO2      Last Pain:  Vitals:   05/27/22 1036  TempSrc:   PainSc: 6       Patients Stated Pain Goal: 4 (85/63/14 9702)  Complications: No notable events documented.

## 2022-05-28 ENCOUNTER — Encounter (HOSPITAL_COMMUNITY): Payer: Self-pay | Admitting: Urology

## 2022-05-28 LAB — HEMOGLOBIN A1C
Hgb A1c MFr Bld: 6.8 % — ABNORMAL HIGH (ref 4.8–5.6)
Mean Plasma Glucose: 148 mg/dL

## 2022-06-18 ENCOUNTER — Other Ambulatory Visit: Payer: Self-pay | Admitting: Neurology

## 2022-06-26 ENCOUNTER — Non-Acute Institutional Stay: Payer: Medicare Other | Admitting: Hospice

## 2022-06-26 DIAGNOSIS — G35 Multiple sclerosis: Secondary | ICD-10-CM

## 2022-06-26 DIAGNOSIS — M4628 Osteomyelitis of vertebra, sacral and sacrococcygeal region: Secondary | ICD-10-CM

## 2022-06-26 DIAGNOSIS — Z515 Encounter for palliative care: Secondary | ICD-10-CM

## 2022-06-26 DIAGNOSIS — N319 Neuromuscular dysfunction of bladder, unspecified: Secondary | ICD-10-CM

## 2022-06-26 NOTE — Progress Notes (Signed)
[facility] Consult Note Telephone: [phone]  Fax: [phone]  PATIENT NAME: Laura Roberts DOB: 1941-01-30 MRN: [id number]  PRIMARY CARE PROVIDER:   Dr Edrick Oh  REFERRING PROVIDER: Dr Gean Birchwood   RESPONSIBLE PARTY:  [place]     Name [place] [employer]   Loyall III Son   [phone]   Al, [place] [phone]  [phone]       Visit is to build trust and highlight Palliative Medicine as specialized medical care for people living with serious illness, aimed at facilitating better quality of life through symptoms relief, assisting with advance care planning and complex medical decision making. This is a follow up visit.  Visit consisted of counseling and education dealing with the complex and emotionally intense issues of symptom management and palliative care in the setting of serious and potentially life-threatening illness. Palliative care team will continue to support patient, patient's family, and medical team.  RECOMMENDATIONS/PLAN:   Code Status: Patient affirmed she is a DNR. Signed DNR in facility chart and in [place].   Goals of Care: Goals of care include to maximize quality of life and symptom management. MOST form selections  include DO NOT RESUSCITATE, limited additional interventions, IV fluids as indicated, antibiotics as indicated, no feeding tube  Palliative care team will continue to support patient, patient's family, and medical team.  Symptom management/Plan:  Sacral Osteomyelitis: Improved.  Wound VAC discontinued.  Followed by facility wound nurse.  Discharged by Infectious Disease. Repositioning encouraged.  Facility wound consultant and wound RN report wound improvement.  Wound offloading with diverting colostomy is ongoing.  Resume with ID if needed.  Multiple sclerosis: Progressive, advanced,   Nonambulatory, Hoyer lift for all transfers.  Continue Tecfidera,  Neurontin, baclofen. Continue with restorative exercises and ongoing occupational therapy. Neurologist consult as planned. Neurogenic bladder: managed with suprapubic catheter for neurogenic bladder as well as intravesical Botox Followed by urologist.  Marguerite Olea Botox injections and Solifenacin for bladder spasm Follow up: Palliative care will continue to follow for complex medical decision making, advance care planning, and clarification of goals. Return 6 weeks or prn. Encouraged to call provider sooner with any concerns.  CHIEF COMPLAINT: Palliative follow up  HISTORY OF PRESENT ILLNESS:  Laura Roberts a 81 y.o. female with multiple morbidities requiring close monitoring/management with high risk of complications and morbidity: Sacral osteomyelitis, multiple sclerosis, muscle spasm, type 2 diabetes mellitus, depression, neurogenic bladder. Patient in no distress, reports overall doing better than before, with more energy. History obtained from review of EMR, discussion with primary team, family and/or patient. Records reviewed and summarized above. All 10 point systems reviewed and are negative except as documented in history of present illness above  Review and summarization of Epic records shows history from other than patient.   Palliative Care was asked to follow this patient o help address complex decision making in the context of advance care planning and goals of care clarification. I reviewed, as needed, available labs, patient records, imaging, studies and related documents from the EMR.    PERTINENT MEDICATIONS:  Outpatient Encounter Medications as of 06/26/2022  Medication Sig   acetaminophen (TYLENOL) 325 MG tablet Take 650 mg by mouth every 6 (six) hours as needed for mild pain or fever.    Amino Acids-Protein Hydrolys (FEEDING SUPPLEMENT, PRO-STAT SUGAR FREE 64,) LIQD Take 30 mLs by mouth 2 (two) times daily. (0900 & 1700)   baclofen (LIORESAL) 10 MG tablet Take 10-20 mg  by mouth See  admin instructions. Take 15 mg) by mouth in the morning, 10 mg midday for spasms & 20 mg by mouth at bedtime   barrier cream (NON-SPECIFIED) CREA Apply 1 application topically 2 (two) times daily. (0700 & 1900) Apply to right ischium and bilateral buttocks   bisacodyl (DULCOLAX) 10 MG suppository Place 10 mg rectally daily as needed for moderate constipation (constipation not relieved by milk of magnesium).   calcium citrate (CALCITRATE - DOSED IN MG ELEMENTAL CALCIUM) 950 (200 Ca) MG tablet Take 200 mg of elemental calcium by mouth daily. (0800)   Carboxymeth-Glycerin-Polysorb (REFRESH OPTIVE ADVANCED OP) Apply 1 drop to eye 4 (four) times daily as needed.   cholecalciferol (VITAMIN D) 25 MCG (1000 UNIT) tablet Take 1,000 Units by mouth daily. (1000)   cycloSPORINE (RESTASIS) 0.05 % ophthalmic emulsion Place 1 drop into both eyes at bedtime. (2000)   Dimethyl Fumarate 240 MG CPDR One po bid (Patient taking differently: One po bid, takes trulicity)   Dulaglutide (TRULICITY) 3 WE/3.1VQ SOPN Inject 3 mg into the skin every Tuesday. (1000)   DULoxetine (CYMBALTA) 30 MG capsule Take 3 capsules (90 mg total) by mouth 2 (two) times daily. (0800)Give along with 30 mg to = 90 mg (Patient taking differently: Take 90 mg by mouth daily. (1000))   feeding supplement, GLUCERNA SHAKE, (GLUCERNA SHAKE) LIQD Take 237 mLs by mouth 2 (two) times daily between meals.   ferrous sulfate 325 (65 FE) MG tablet Take 325 mg by mouth 2 (two) times daily with a meal. (1000 & 2200)   furosemide (LASIX) 20 MG tablet Take 1 tablet (20 mg total) by mouth every other day. (Patient taking differently: Take 20 mg by mouth every other day. 10:00)   gabapentin (NEURONTIN) 100 MG capsule Take 200 mg by mouth 3 (three) times daily. (0800, 1400 & 2000)   insulin glargine (LANTUS) 100 UNIT/ML Solostar Pen Inject 4 Units into the skin at bedtime. (2000)   loperamide (IMODIUM) 2 MG capsule Take 2 mg by mouth daily as needed for diarrhea or  loose stools (SUBSEQUENT DOSE GIVE ONE TAB AFTER EACH STOOL (max 8 mg/24 hrs.)).   magnesium hydroxide (MILK OF MAGNESIA) 400 MG/5ML suspension Take 30 mLs by mouth daily as needed for mild constipation.   magnesium oxide (MAG-OX) 400 MG tablet Take 400 mg by mouth 2 (two) times daily. (1000 & 2200)   methimazole (TAPAZOLE) 5 MG tablet Take 1 tablet (5 mg total) by mouth daily. (Patient taking differently: Take 5 mg by mouth daily at 6 (six) AM. (0600))   metoprolol succinate (TOPROL-XL) 50 MG 24 hr tablet Take 50 mg by mouth daily after breakfast. (0800)   Multiple Vitamin (MULTIVITAMIN WITH MINERALS) TABS tablet Take 1 tablet by mouth in the morning. (1000)   ondansetron (ZOFRAN) 4 MG tablet Take 4 mg by mouth every 6 (six) hours as needed for nausea or vomiting.   pantoprazole (PROTONIX) 40 MG tablet Take 40 mg by mouth daily at 6 (six) AM. (0600)   polyethylene glycol (MIRALAX / GLYCOLAX) 17 g packet Take 17 g by mouth daily as needed. (Patient taking differently: Take 17 g by mouth daily. (1000))   predniSONE (DELTASONE) 2.5 MG tablet Take 2.5 mg by mouth every other day. (1000) Alternating days with 5 mg dose   predniSONE (DELTASONE) 5 MG tablet Take 5 mg by mouth every other day. (10:00) Alternating days with 2.5 mg dose   SF 5000 PLUS 1.1 % CREA dental cream Place  1 application  onto teeth daily at 8 pm. (2000) Pea size  Spit our excess and do not rinse water   Sodium Phosphates (RA SALINE ENEMA RE) Place 1 Bottle rectally daily as needed (severe constipation (not relieved by bisacodyl)).   solifenacin (VESICARE) 10 MG tablet Take 10 mg by mouth daily. (0800)   vitamin C (ASCORBIC ACID) 500 MG tablet Take 500 mg by mouth in the morning and at bedtime. (1000 & 2200)   No facility-administered encounter medications on file as of 06/26/2022.    HOSPICE ELIGIBILITY/DIAGNOSIS: TBD  PAST MEDICAL HISTORY:  Past Medical History:  Diagnosis Date   Age related osteoporosis    Anemia     Aphasia    Bronchitis    hx of    Cellulitis and abscess of toe    hx of    Closed supracondylar fracture of right femur, periprosthetic 06/01/2014   Constipation    Cystostomy in place [facility])    Decubitus ulcer    Decubitus ulcer of ischial area    Right   Degenerative arthritis    osteoarthritis    Depression    Diabetes mellitus without complication (HCC)    type 2    Edema    localized    Full incontinence of feces    GERD (gastroesophageal reflux disease)    Gout    hx of    History of falling    Hyperlipidemia    Hypertension    Hyperthyroidism    Intertrochanteric fracture (HCC)    Left, chronic   Left perineal ischial pressure ulcer 09/18/2021   Multiple sclerosis (HCC)    Advanced   Neurogenic bowel    Neuromuscular disorder (HCC)    Bilateral hand carpal tunnel syndrome   Neuromuscular dysfunction of bladder    Nontoxic multinodular goiter    Obesity    Osteomyelitis of coccyx ([place]) 04/05/2021   Osteoporosis    Other adrenocortical insufficiency (HCC)    Paraplegia (HCC)    LEGS   Personal history of urinary (tract) infections    Polyneuropathy    Pressure ulcer of right buttock, stage 4 (HCC)    Sacral decubitus ulcer    stage IV decubitus ulcer   Sacral osteomyelitis ([place]) 04/05/2021   Spinal stenosis in cervical region    Spinal stenosis of lumbosacral region    Spinal stenosis, thoracic    Tinea unguium    Vitamin D deficiency       ALLERGIES:  Allergies  Allergen Reactions   Codeine Shortness Of Breath and Other (See Comments)    Difficult breathing and skin problem   Ultram [Tramadol] Shortness Of Breath and Other (See Comments)    Difficult breathing and skin peeling   Januvia [Sitagliptin] Rash and Other (See Comments)    Blisters    I spent 45 minutes providing this consultation; time includes spent with patient/family, chart review and documentation. More than 50% of the time in this consultation was spent on care  coordination  Thank you for the opportunity to participate in the care of JOVI ALVIZO Please call our office at [phone] if we can be of additional assistance.  Note: Portions of this note were generated with [employer]. Dictation errors may occur despite best attempts at proofreading.  Teodoro Spray, NP

## 2022-07-29 ENCOUNTER — Non-Acute Institutional Stay: Payer: Medicare Other | Admitting: Hospice

## 2022-07-29 DIAGNOSIS — G35 Multiple sclerosis: Secondary | ICD-10-CM

## 2022-07-29 DIAGNOSIS — N319 Neuromuscular dysfunction of bladder, unspecified: Secondary | ICD-10-CM

## 2022-07-29 DIAGNOSIS — Z515 Encounter for palliative care: Secondary | ICD-10-CM

## 2022-07-29 DIAGNOSIS — M4628 Osteomyelitis of vertebra, sacral and sacrococcygeal region: Secondary | ICD-10-CM

## 2022-07-29 NOTE — Progress Notes (Signed)
[facility] Consult Note Telephone: [phone]  Fax: [phone]  PATIENT NAME: Laura Roberts DOB: 10/30/1940 MRN: [id number]  PRIMARY CARE PROVIDER:   Dr Edrick Oh  REFERRING PROVIDER: Dr Gean Birchwood   RESPONSIBLE PARTY:  [place]     Name [place] [employer]   Britton III Son   [phone]   Laura Roberts, Laura Roberts [phone]  [phone]       Visit is to build trust and highlight Palliative Medicine as specialized medical care for people living with serious illness, aimed at facilitating better quality of life through symptoms relief, assisting with advance care planning and complex medical decision making. This is a follow up visit.  Visit consisted of counseling and education dealing with the complex and emotionally intense issues of symptom management and palliative care in the setting of serious and potentially life-threatening illness. Palliative care team will continue to support patient, patient's family, and medical team.  RECOMMENDATIONS/PLAN:   Code Status: Patient is a DNR. Signed DNR in facility chart and in [place].   Goals of Care: Goals of care include to maximize quality of life and symptom management. MOST form selections  include DO NOT RESUSCITATE, limited additional interventions, IV fluids as indicated, antibiotics as indicated, no feeding tube  Palliative care team will continue to support patient, patient's family, and medical team.  Symptom management/Plan:  Sacral Osteomyelitis: continuing to improve per wound nurse; discharged by Infectious Disease. Wound VAC discontinued. Repositioning encouraged.  Wound offloading with diverting colostomy is ongoing.  Resume with ID if needed. Continue Prostat, med pass and mutivitamin to help promote healing.  Multiple sclerosis: advanced - nonambulatory, Hoyer lift for all transfers.  Continue Tecfidera, Neurontin, baclofen. Continue with  restorative exercises and ongoing occupational therapy.  Fall precautions. Neurologist consult as planned. Neurogenic bladder: stable, managed with suprapubic catheter for neurogenic bladder as well as intravesical Botox . Followed by urologist. Laura Roberts Botox injections and Solifenacin for bladder spasm  Follow up: Palliative care will continue to follow for complex medical decision making, advance care planning, and clarification of goals. Return 4 weeks or prn. Encouraged to call provider sooner with any concerns.  CHIEF COMPLAINT: Palliative follow up  HISTORY OF PRESENT ILLNESS:  Laura Roberts a 82 y.o. female with multiple morbidities requiring close monitoring/management with high risk of complications and morbidity: Sacral osteomyelitis, multiple sclerosis, muscle spasm, type 2 diabetes mellitus, depression, neurogenic bladder. Patient in no distress, reports overall doing better than before, denies pain/discomfort. History obtained from review of EMR, discussion with primary team, family and/or patient. Records reviewed and summarized above. All 10 point systems reviewed and are negative except as documented in history of present illness above  Review and summarization of Epic records shows history from other than patient.   Palliative Care was asked to follow this patient o help address complex decision making in the context of advance care planning and goals of care clarification. I reviewed, as needed, available labs, patient records, imaging, studies and related documents from the EMR.    PERTINENT MEDICATIONS:  Outpatient Encounter Medications as of 07/29/2022  Medication Sig   acetaminophen (TYLENOL) 325 MG tablet Take 650 mg by mouth every 6 (six) hours as needed for mild pain or fever.    Amino Acids-Protein Hydrolys (FEEDING SUPPLEMENT, PRO-STAT SUGAR FREE 64,) LIQD Take 30 mLs by mouth 2 (two) times daily. (0900 & 1700)   baclofen (LIORESAL) 10 MG tablet Take 10-20 mg by mouth  See admin instructions. Take 15 mg) by mouth in the morning, 10 mg midday for spasms & 20 mg by mouth at bedtime   barrier cream (NON-SPECIFIED) CREA Apply 1 application topically 2 (two) times daily. (0700 & 1900) Apply to right ischium and bilateral buttocks   bisacodyl (DULCOLAX) 10 MG suppository Place 10 mg rectally daily as needed for moderate constipation (constipation not relieved by milk of magnesium).   calcium citrate (CALCITRATE - DOSED IN MG ELEMENTAL CALCIUM) 950 (200 Ca) MG tablet Take 200 mg of elemental calcium by mouth daily. (0800)   Carboxymeth-Glycerin-Polysorb (REFRESH OPTIVE ADVANCED OP) Apply 1 drop to eye 4 (four) times daily as needed.   cholecalciferol (VITAMIN D) 25 MCG (1000 UNIT) tablet Take 1,000 Units by mouth daily. (1000)   cycloSPORINE (RESTASIS) 0.05 % ophthalmic emulsion Place 1 drop into both eyes at bedtime. (2000)   Dimethyl Fumarate 240 MG CPDR One po bid (Patient taking differently: One po bid, takes trulicity)   Dulaglutide (TRULICITY) 3 GE/3.6OQ SOPN Inject 3 mg into the skin every Tuesday. (1000)   DULoxetine (CYMBALTA) 30 MG capsule Take 3 capsules (90 mg total) by mouth 2 (two) times daily. (0800)Give along with 30 mg to = 90 mg (Patient taking differently: Take 90 mg by mouth daily. (1000))   feeding supplement, GLUCERNA SHAKE, (GLUCERNA SHAKE) LIQD Take 237 mLs by mouth 2 (two) times daily between meals.   ferrous sulfate 325 (65 FE) MG tablet Take 325 mg by mouth 2 (two) times daily with a meal. (1000 & 2200)   furosemide (LASIX) 20 MG tablet Take 1 tablet (20 mg total) by mouth every other day. (Patient taking differently: Take 20 mg by mouth every other day. 10:00)   gabapentin (NEURONTIN) 100 MG capsule Take 200 mg by mouth 3 (three) times daily. (0800, 1400 & 2000)   insulin glargine (LANTUS) 100 UNIT/ML Solostar Pen Inject 4 Units into the skin at bedtime. (2000)   loperamide (IMODIUM) 2 MG capsule Take 2 mg by mouth daily as needed for diarrhea  or loose stools (SUBSEQUENT DOSE GIVE ONE TAB AFTER EACH STOOL (max 8 mg/24 hrs.)).   magnesium hydroxide (MILK OF MAGNESIA) 400 MG/5ML suspension Take 30 mLs by mouth daily as needed for mild constipation.   magnesium oxide (MAG-OX) 400 MG tablet Take 400 mg by mouth 2 (two) times daily. (1000 & 2200)   methimazole (TAPAZOLE) 5 MG tablet Take 1 tablet (5 mg total) by mouth daily. (Patient taking differently: Take 5 mg by mouth daily at 6 (six) AM. (0600))   metoprolol succinate (TOPROL-XL) 50 MG 24 hr tablet Take 50 mg by mouth daily after breakfast. (0800)   Multiple Vitamin (MULTIVITAMIN WITH MINERALS) TABS tablet Take 1 tablet by mouth in the morning. (1000)   ondansetron (ZOFRAN) 4 MG tablet Take 4 mg by mouth every 6 (six) hours as needed for nausea or vomiting.   pantoprazole (PROTONIX) 40 MG tablet Take 40 mg by mouth daily at 6 (six) AM. (0600)   polyethylene glycol (MIRALAX / GLYCOLAX) 17 g packet Take 17 g by mouth daily as needed. (Patient taking differently: Take 17 g by mouth daily. (1000))   predniSONE (DELTASONE) 2.5 MG tablet Take 2.5 mg by mouth every other day. (1000) Alternating days with 5 mg dose   predniSONE (DELTASONE) 5 MG tablet Take 5 mg by mouth every other day. (10:00) Alternating days with 2.5 mg dose   SF 5000 PLUS 1.1 % CREA dental cream Place 1 application  onto teeth daily at 8 pm. (2000) Pea size  Spit our excess and do not rinse water   Sodium Phosphates (RA SALINE ENEMA RE) Place 1 Bottle rectally daily as needed (severe constipation (not relieved by bisacodyl)).   solifenacin (VESICARE) 10 MG tablet Take 10 mg by mouth daily. (0800)   vitamin C (ASCORBIC ACID) 500 MG tablet Take 500 mg by mouth in the morning and at bedtime. (1000 & 2200)   No facility-administered encounter medications on file as of 07/29/2022.    HOSPICE ELIGIBILITY/DIAGNOSIS: TBD  PAST MEDICAL HISTORY:  Past Medical History:  Diagnosis Date   Age related osteoporosis    Anemia     Aphasia    Bronchitis    hx of    Cellulitis and abscess of toe    hx of    Closed supracondylar fracture of right femur, periprosthetic 06/01/2014   Constipation    Cystostomy in place [facility])    Decubitus ulcer    Decubitus ulcer of ischial area    Right   Degenerative arthritis    osteoarthritis    Depression    Diabetes mellitus without complication (HCC)    type 2    Edema    localized    Full incontinence of feces    GERD (gastroesophageal reflux disease)    Gout    hx of    History of falling    Hyperlipidemia    Hypertension    Hyperthyroidism    Intertrochanteric fracture (HCC)    Left, chronic   Left perineal ischial pressure ulcer 09/18/2021   Multiple sclerosis (HCC)    Advanced   Neurogenic bowel    Neuromuscular disorder (HCC)    Bilateral hand carpal tunnel syndrome   Neuromuscular dysfunction of bladder    Nontoxic multinodular goiter    Obesity    Osteomyelitis of coccyx ([place]) 04/05/2021   Osteoporosis    Other adrenocortical insufficiency (HCC)    Paraplegia (HCC)    LEGS   Personal history of urinary (tract) infections    Polyneuropathy    Pressure ulcer of right buttock, stage 4 (HCC)    Sacral decubitus ulcer    stage IV decubitus ulcer   Sacral osteomyelitis ([place]) 04/05/2021   Spinal stenosis in cervical region    Spinal stenosis of lumbosacral region    Spinal stenosis, thoracic    Tinea unguium    Vitamin D deficiency       ALLERGIES:  Allergies  Allergen Reactions   Codeine Shortness Of Breath and Other (See Comments)    Difficult breathing and skin problem   Ultram [Tramadol] Shortness Of Breath and Other (See Comments)    Difficult breathing and skin peeling   Januvia [Sitagliptin] Rash and Other (See Comments)    Blisters    I spent 35 minutes providing this consultation; time includes spent with patient/family, chart review and documentation. More than 50% of the time in this consultation was spent on care  coordination  Thank you for the opportunity to participate in the care of NYKOLE MATOS Please call our office at [phone] if we can be of additional assistance.  Note: Portions of this note were generated with Lobbyist. Dictation errors may occur despite best attempts at proofreading.  Teodoro Spray, NP

## 2022-08-22 ENCOUNTER — Non-Acute Institutional Stay: Payer: Medicare Other | Admitting: Hospice

## 2022-08-22 DIAGNOSIS — M4628 Osteomyelitis of vertebra, sacral and sacrococcygeal region: Secondary | ICD-10-CM

## 2022-08-22 DIAGNOSIS — G35 Multiple sclerosis: Secondary | ICD-10-CM

## 2022-08-22 DIAGNOSIS — R531 Weakness: Secondary | ICD-10-CM

## 2022-08-22 DIAGNOSIS — Z515 Encounter for palliative care: Secondary | ICD-10-CM

## 2022-08-22 NOTE — Progress Notes (Signed)
[facility] [place] [facility] Consult Note Telephone: [phone]  Fax: [phone]  PATIENT NAME: Laura Roberts DOB: [birth year] MRN: [id number]  PRIMARY CARE PROVIDER:   Dr Edrick Oh  REFERRING PROVIDER: Dr Gean Birchwood   RESPONSIBLE PARTY:  [place]     Name [place] [employer]   [place] III Son   [phone]   Corrie, [place] [phone]  [phone]       Visit is to build trust and highlight Palliative Medicine as specialized medical care for people living with serious illness, aimed at facilitating better quality of life through symptoms relief, assisting with advance care planning and complex medical decision making. This is a follow up visit.  Visit consisted of counseling and education dealing with the complex and emotionally intense issues of symptom management and palliative care in the setting of serious and potentially life-threatening illness.  Patient shared she is aware of her voting/political rights and was happy to vote today. Validation and ample emotional support provided.  Palliative care team will continue to support patient, patient's family, and medical team.  RECOMMENDATIONS/PLAN:   Code Status: Patient is a DNR.    Goals of Care: Goals of care include to maximize quality of life and symptom management. MOST form selections  include DO NOT RESUSCITATE, limited additional interventions, IV fluids as indicated, antibiotics as indicated, no feeding tube  Palliative care team will continue to support patient, patient's family, and medical team.  Symptom management/Plan:  Sacral Osteomyelitis: Continue wound offloading with diverting colostomy. Wound continues to improve per wound nurse; paient discharged by Infectious Disease. Wound VAC discontinued.  Continue Prostat, med pass and mutivitamin to help promote healing.  Weakness: Continue with facility restorative exercises. Encourage activity as  tolerated to optimize wellbeing.  Multiple sclerosis: advanced - nonambulatory, Hoyer lift for all transfers.  Continue Tecfidera, Neurontin, baclofen. Continue with restorative exercises and ongoing occupational therapy. Gets around with her power chair.  Fall precautions. Neurologist consult as planned. Neurogenic bladder: stable, managed with suprapubic catheter for neurogenic bladder as well as intravesical Botox . Followed by urologist. Marguerite Olea Botox injections and Solifenacin for bladder spasm  Follow up: Palliative care will continue to follow for complex medical decision making, advance care planning, and clarification of goals. Return 4 weeks or prn. Encouraged to call provider sooner with any concerns.  CHIEF COMPLAINT: Palliative follow up  HISTORY OF PRESENT ILLNESS:  Laura Roberts a 82 y.o. female with multiple morbidities requiring close monitoring/management with high risk of complications and morbidity: Sacral osteomyelitis, multiple sclerosis, muscle spasm, type 2 diabetes mellitus, depression, neurogenic bladder. Patient in no distress, reports overall doing well, denies pain/discomfort. History obtained from review of EMR, discussion with primary team, family and/or patient. Records reviewed and summarized above. All 10 point systems reviewed and are negative except as documented in history of present illness above  Review and summarization of Epic records shows history from other than patient.   Palliative Care was asked to follow this patient o help address complex decision making in the context of advance care planning and goals of care clarification. I reviewed, as needed, available labs, patient records, imaging, studies and related documents from the EMR.    PERTINENT MEDICATIONS:  Outpatient Encounter Medications as of 08/22/2022  Medication Sig   acetaminophen (TYLENOL) 325 MG tablet Take 650 mg by mouth every 6 (six) hours as needed for mild pain or fever.    Amino  Acids-Protein Hydrolys (FEEDING SUPPLEMENT, PRO-STAT SUGAR  FREE 64,) LIQD Take 30 mLs by mouth 2 (two) times daily. (0900 & 1700)   baclofen (LIORESAL) 10 MG tablet Take 10-20 mg by mouth See admin instructions. Take 15 mg) by mouth in the morning, 10 mg midday for spasms & 20 mg by mouth at bedtime   barrier cream (NON-SPECIFIED) CREA Apply 1 application topically 2 (two) times daily. (0700 & 1900) Apply to right ischium and bilateral buttocks   bisacodyl (DULCOLAX) 10 MG suppository Place 10 mg rectally daily as needed for moderate constipation (constipation not relieved by milk of magnesium).   calcium citrate (CALCITRATE - DOSED IN MG ELEMENTAL CALCIUM) 950 (200 Ca) MG tablet Take 200 mg of elemental calcium by mouth daily. (0800)   Carboxymeth-Glycerin-Polysorb (REFRESH OPTIVE ADVANCED OP) Apply 1 drop to eye 4 (four) times daily as needed.   cholecalciferol (VITAMIN D) 25 MCG (1000 UNIT) tablet Take 1,000 Units by mouth daily. (1000)   cycloSPORINE (RESTASIS) 0.05 % ophthalmic emulsion Place 1 drop into both eyes at bedtime. (2000)   Dimethyl Fumarate 240 MG CPDR One po bid (Patient taking differently: One po bid, takes trulicity)   Dulaglutide (TRULICITY) 3 0000000 SOPN Inject 3 mg into the skin every Tuesday. (1000)   DULoxetine (CYMBALTA) 30 MG capsule Take 3 capsules (90 mg total) by mouth 2 (two) times daily. (0800)Give along with 30 mg to = 90 mg (Patient taking differently: Take 90 mg by mouth daily. (1000))   feeding supplement, GLUCERNA SHAKE, (GLUCERNA SHAKE) LIQD Take 237 mLs by mouth 2 (two) times daily between meals.   ferrous sulfate 325 (65 FE) MG tablet Take 325 mg by mouth 2 (two) times daily with a meal. (1000 & 2200)   furosemide (LASIX) 20 MG tablet Take 1 tablet (20 mg total) by mouth every other day. (Patient taking differently: Take 20 mg by mouth every other day. 10:00)   gabapentin (NEURONTIN) 100 MG capsule Take 200 mg by mouth 3 (three) times daily. (0800, 1400 & 2000)    insulin glargine (LANTUS) 100 UNIT/ML Solostar Pen Inject 4 Units into the skin at bedtime. (2000)   loperamide (IMODIUM) 2 MG capsule Take 2 mg by mouth daily as needed for diarrhea or loose stools (SUBSEQUENT DOSE GIVE ONE TAB AFTER EACH STOOL (max 8 mg/24 hrs.)).   magnesium hydroxide (MILK OF MAGNESIA) 400 MG/5ML suspension Take 30 mLs by mouth daily as needed for mild constipation.   magnesium oxide (MAG-OX) 400 MG tablet Take 400 mg by mouth 2 (two) times daily. (1000 & 2200)   methimazole (TAPAZOLE) 5 MG tablet Take 1 tablet (5 mg total) by mouth daily. (Patient taking differently: Take 5 mg by mouth daily at 6 (six) AM. (0600))   metoprolol succinate (TOPROL-XL) 50 MG 24 hr tablet Take 50 mg by mouth daily after breakfast. (0800)   Multiple Vitamin (MULTIVITAMIN WITH MINERALS) TABS tablet Take 1 tablet by mouth in the morning. (1000)   ondansetron (ZOFRAN) 4 MG tablet Take 4 mg by mouth every 6 (six) hours as needed for nausea or vomiting.   pantoprazole (PROTONIX) 40 MG tablet Take 40 mg by mouth daily at 6 (six) AM. (0600)   polyethylene glycol (MIRALAX / GLYCOLAX) 17 g packet Take 17 g by mouth daily as needed. (Patient taking differently: Take 17 g by mouth daily. (1000))   predniSONE (DELTASONE) 2.5 MG tablet Take 2.5 mg by mouth every other day. (1000) Alternating days with 5 mg dose   predniSONE (DELTASONE) 5 MG tablet Take 5  mg by mouth every other day. (10:00) Alternating days with 2.5 mg dose   SF 5000 PLUS 1.1 % CREA dental cream Place 1 application  onto teeth daily at 8 pm. (2000) Pea size  Spit our excess and do not rinse water   Sodium Phosphates (RA SALINE ENEMA RE) Place 1 Bottle rectally daily as needed (severe constipation (not relieved by bisacodyl)).   solifenacin (VESICARE) 10 MG tablet Take 10 mg by mouth daily. (0800)   vitamin C (ASCORBIC ACID) 500 MG tablet Take 500 mg by mouth in the morning and at bedtime. (1000 & 2200)   No facility-administered encounter  medications on file as of 08/22/2022.    HOSPICE ELIGIBILITY/DIAGNOSIS: TBD  PAST MEDICAL HISTORY:  Past Medical History:  Diagnosis Date   Age related osteoporosis    Anemia    Aphasia    Bronchitis    hx of    Cellulitis and abscess of toe    hx of    Closed supracondylar fracture of right femur, periprosthetic 06/01/2014   Constipation    Cystostomy in place [facility])    Decubitus ulcer    Decubitus ulcer of ischial area    Right   Degenerative arthritis    osteoarthritis    Depression    Diabetes mellitus without complication (HCC)    type 2    Edema    localized    Full incontinence of feces    GERD (gastroesophageal reflux disease)    Gout    hx of    History of falling    Hyperlipidemia    Hypertension    Hyperthyroidism    Intertrochanteric fracture (HCC)    Left, chronic   Left perineal ischial pressure ulcer 09/18/2021   Multiple sclerosis (HCC)    Advanced   Neurogenic bowel    Neuromuscular disorder (HCC)    Bilateral hand carpal tunnel syndrome   Neuromuscular dysfunction of bladder    Nontoxic multinodular goiter    Obesity    Osteomyelitis of coccyx ([place]) 04/05/2021   Osteoporosis    Other adrenocortical insufficiency (HCC)    Paraplegia (HCC)    LEGS   Personal history of urinary (tract) infections    Polyneuropathy    Pressure ulcer of right buttock, stage 4 (HCC)    Sacral decubitus ulcer    stage IV decubitus ulcer   Sacral osteomyelitis ([place]) 04/05/2021   Spinal stenosis in cervical region    Spinal stenosis of lumbosacral region    Spinal stenosis, thoracic    Tinea unguium    Vitamin D deficiency       ALLERGIES:  Allergies  Allergen Reactions   Codeine Shortness Of Breath and Other (See Comments)    Difficult breathing and skin problem   Ultram [Tramadol] Shortness Of Breath and Other (See Comments)    Difficult breathing and skin peeling   Januvia [Sitagliptin] Rash and Other (See Comments)    Blisters  I spent 35 minutes  providing this consultation; time includes spent with patient/family, chart review and documentation. More than 50% of the time in this consultation was spent on care coordination.  Thank you for the opportunity to participate in the care of MAILYN HORITA Please call our office at [phone] if we can be of additional assistance.  Note: Portions of this note were generated with Lobbyist. Dictation errors may occur despite best attempts at proofreading.  Teodoro Spray, NP

## 2022-09-12 ENCOUNTER — Telehealth: Payer: Self-pay | Admitting: Neurology

## 2022-09-12 MED ORDER — DIMETHYL FUMARATE 240 MG PO CPDR: DELAYED_RELEASE_CAPSULE | ORAL | 4 refills | Status: DC

## 2022-09-12 NOTE — Telephone Encounter (Addendum)
Last seen on 04/30/22 Follow up scheduled on 05/01/23 Rx sent.

## 2022-09-12 NOTE — Telephone Encounter (Signed)
Pt is requesting a refill for TECFIDERA 240 MG CPDR .  Pharmacy: [facility]  Ph [phone]

## 2022-09-24 NOTE — Telephone Encounter (Addendum)
Pt called. Stated she needs to talk to nurse about why the generic brand for  TECFIDERA 240 MG CPDR .   was called on to pharmacy.

## 2022-09-24 NOTE — Telephone Encounter (Signed)
Appears pt previously filling with [facility]. I called them at [phone] . Spoke w/ Paul/pharmacist. States pt filled for brand name Tecfidera 05/13/22 #60 per pt request (rx sent in for generic).   I called pt to further discuss. LVM for her to call office.

## 2022-09-25 NOTE — Telephone Encounter (Signed)
I called and spoke with patient she states no trying generic. I explained per note 10/23 "Continue Tecfidera. We will switch to the generic for insurance. "  Patient said she will go ahead and try generic will discuss with pharmacy to fill generic tecfidera.  She will follow up with office if any problems with Rx.

## 2022-09-25 NOTE — Telephone Encounter (Signed)
Pt called back. Requesting a nurse call her back.

## 2022-09-30 ENCOUNTER — Non-Acute Institutional Stay: Payer: Medicare Other | Admitting: Hospice

## 2022-09-30 DIAGNOSIS — N319 Neuromuscular dysfunction of bladder, unspecified: Secondary | ICD-10-CM

## 2022-09-30 DIAGNOSIS — Z515 Encounter for palliative care: Secondary | ICD-10-CM

## 2022-09-30 DIAGNOSIS — R531 Weakness: Secondary | ICD-10-CM

## 2022-09-30 DIAGNOSIS — M4628 Osteomyelitis of vertebra, sacral and sacrococcygeal region: Secondary | ICD-10-CM

## 2022-09-30 DIAGNOSIS — G35 Multiple sclerosis: Secondary | ICD-10-CM

## 2022-09-30 DIAGNOSIS — M25512 Pain in left shoulder: Secondary | ICD-10-CM

## 2022-09-30 NOTE — Progress Notes (Signed)
[place] Consult Note Telephone: [phone]  Fax: [phone]  PATIENT NAME: Laura Roberts DOB: 1941-01-01 MRN: [id number]  PRIMARY CARE PROVIDER:   Dr Edrick Oh  REFERRING PROVIDER: Dr Gean Birchwood   RESPONSIBLE PARTY:  [place]     Name [place] [employer]   [place] III Son   [phone]   Laura, Roberts [phone]  [phone]       Visit is to build trust and highlight Palliative Medicine as specialized medical care for people living with serious illness, aimed at facilitating better quality of life through symptoms relief, assisting with advance care planning and complex medical decision making. This is a follow up visit.  Visit consisted of counseling and education dealing with the complex and emotionally intense issues of symptom management and palliative care in the setting of serious and potentially life-threatening illness.  Palliative care team will continue to support patient, patient's family, and medical team.  RECOMMENDATIONS/PLAN:   Code Status: Patient is a DNR.    Goals of Care: Goals of care include to maximize quality of life and symptom management. MOST form selections  include DO NOT RESUSCITATE, limited additional interventions, IV fluids as indicated, antibiotics as indicated, no feeding tube  Palliative care team will continue to support patient, patient's family, and medical team.  Symptom management/Plan:  Sacral Osteomyelitis: Continue daily dressing changes ordered. Continue Prostat, med pass and mutivitamin to help promote healing. Continue wound offloading with diverting colostomy. Wound continues to improve per wound nurse; paient discharged by Infectious Disease.    Weakness: Improving. Continue with facility restorative exercises 5 days a week - passive ROM leg exercises,  hand exercises, weights etc.. Encourage activity as tolerated to optimize wellbeing. Fall  precautions.  Multiple sclerosis: advanced - nonambulatory, Hoyer lift for all transfers.  Continue Dimethyl Fumarate, Neurontin, baclofen. Continue with restorative exercises and ongoing occupational therapy. Gets around with her power chair. Neurologist consult as planned.  Left shoulder pain: Acute: Volteren gel 1% BID prn pain.   Neurogenic bladder: chronic/stable, managed with suprapubic catheter for neurogenic bladder as well as intravesical Botox. Followed by urologist. Laura Roberts Botox injections and Solifenacin for bladder spasm  Follow up: Palliative care will continue to follow for complex medical decision making, advance care planning, and clarification of goals. Return 4 weeks or prn. Encouraged to call provider sooner with any concerns.  CHIEF COMPLAINT: Palliative follow up  HISTORY OF PRESENT ILLNESS:  Laura Roberts a 82 y.o. female with multiple morbidities requiring close monitoring/management with high risk of complications and morbidity: Sacral osteomyelitis, multiple sclerosis, muscle spasm, type 2 diabetes mellitus, depression, neurogenic bladder. Patient in no distress, reports overall doing well. She reports a mild to moderate aching left shoulder pain worse when she wakes up in the morning, which she thinks is from the way she slept.  She reports did not use Voltaren gel and it was effective.  History obtained from review of EMR, discussion with primary team, family and/or patient. Records reviewed and summarized above. All 10 point systems reviewed and are negative except as documented in history of present illness above  Review and summarization of Epic records shows history from other than patient.   Palliative Care was asked to follow this patient o help address complex decision making in the context of advance care planning and goals of care clarification. I reviewed, as needed, available labs, patient records, imaging, studies and related documents from the EMR.  PERTINENT MEDICATIONS:  Outpatient Encounter Medications as of 09/30/2022  Medication Sig   acetaminophen (TYLENOL) 325 MG tablet Take 650 mg by mouth every 6 (six) hours as needed for mild pain or fever.    Amino Acids-Protein Hydrolys (FEEDING SUPPLEMENT, PRO-STAT SUGAR FREE 64,) LIQD Take 30 mLs by mouth 2 (two) times daily. (0900 & 1700)   baclofen (LIORESAL) 10 MG tablet Take 10-20 mg by mouth See admin instructions. Take 15 mg) by mouth in the morning, 10 mg midday for spasms & 20 mg by mouth at bedtime   barrier cream (NON-SPECIFIED) CREA Apply 1 application topically 2 (two) times daily. (0700 & 1900) Apply to right ischium and bilateral buttocks   bisacodyl (DULCOLAX) 10 MG suppository Place 10 mg rectally daily as needed for moderate constipation (constipation not relieved by milk of magnesium).   calcium citrate (CALCITRATE - DOSED IN MG ELEMENTAL CALCIUM) 950 (200 Ca) MG tablet Take 200 mg of elemental calcium by mouth daily. (0800)   Carboxymeth-Glycerin-Polysorb (REFRESH OPTIVE ADVANCED OP) Apply 1 drop to eye 4 (four) times daily as needed.   cholecalciferol (VITAMIN D) 25 MCG (1000 UNIT) tablet Take 1,000 Units by mouth daily. (1000)   cycloSPORINE (RESTASIS) 0.05 % ophthalmic emulsion Place 1 drop into both eyes at bedtime. (2000)   Dimethyl Fumarate 240 MG CPDR One po bid   Dulaglutide (TRULICITY) 3 0000000 SOPN Inject 3 mg into the skin every Tuesday. (1000)   DULoxetine (CYMBALTA) 30 MG capsule Take 3 capsules (90 mg total) by mouth 2 (two) times daily. (0800)Give along with 30 mg to = 90 mg (Patient taking differently: Take 90 mg by mouth daily. (1000))   feeding supplement, GLUCERNA SHAKE, (GLUCERNA SHAKE) LIQD Take 237 mLs by mouth 2 (two) times daily between meals.   ferrous sulfate 325 (65 FE) MG tablet Take 325 mg by mouth 2 (two) times daily with a meal. (1000 & 2200)   furosemide (LASIX) 20 MG tablet Take 1 tablet (20 mg total) by mouth every other day. (Patient taking  differently: Take 20 mg by mouth every other day. 10:00)   gabapentin (NEURONTIN) 100 MG capsule Take 200 mg by mouth 3 (three) times daily. (0800, 1400 & 2000)   insulin glargine (LANTUS) 100 UNIT/ML Solostar Pen Inject 4 Units into the skin at bedtime. (2000)   loperamide (IMODIUM) 2 MG capsule Take 2 mg by mouth daily as needed for diarrhea or loose stools (SUBSEQUENT DOSE GIVE ONE TAB AFTER EACH STOOL (max 8 mg/24 hrs.)).   magnesium hydroxide (MILK OF MAGNESIA) 400 MG/5ML suspension Take 30 mLs by mouth daily as needed for mild constipation.   magnesium oxide (MAG-OX) 400 MG tablet Take 400 mg by mouth 2 (two) times daily. (1000 & 2200)   methimazole (TAPAZOLE) 5 MG tablet Take 1 tablet (5 mg total) by mouth daily. (Patient taking differently: Take 5 mg by mouth daily at 6 (six) AM. (0600))   metoprolol succinate (TOPROL-XL) 50 MG 24 hr tablet Take 50 mg by mouth daily after breakfast. (0800)   Multiple Vitamin (MULTIVITAMIN WITH MINERALS) TABS tablet Take 1 tablet by mouth in the morning. (1000)   ondansetron (ZOFRAN) 4 MG tablet Take 4 mg by mouth every 6 (six) hours as needed for nausea or vomiting.   pantoprazole (PROTONIX) 40 MG tablet Take 40 mg by mouth daily at 6 (six) AM. (0600)   polyethylene glycol (MIRALAX / GLYCOLAX) 17 g packet Take 17 g by mouth daily as needed. (Patient taking differently: Take  17 g by mouth daily. (1000))   predniSONE (DELTASONE) 2.5 MG tablet Take 2.5 mg by mouth every other day. (1000) Alternating days with 5 mg dose   predniSONE (DELTASONE) 5 MG tablet Take 5 mg by mouth every other day. (10:00) Alternating days with 2.5 mg dose   SF 5000 PLUS 1.1 % CREA dental cream Place 1 application  onto teeth daily at 8 pm. (2000) Pea size  Spit our excess and do not rinse water   Sodium Phosphates (RA SALINE ENEMA RE) Place 1 Bottle rectally daily as needed (severe constipation (not relieved by bisacodyl)).   solifenacin (VESICARE) 10 MG tablet Take 10 mg by mouth  daily. (0800)   vitamin C (ASCORBIC ACID) 500 MG tablet Take 500 mg by mouth in the morning and at bedtime. (1000 & 2200)   No facility-administered encounter medications on file as of 09/30/2022.    HOSPICE ELIGIBILITY/DIAGNOSIS: TBD  PAST MEDICAL HISTORY:  Past Medical History:  Diagnosis Date   Age related osteoporosis    Anemia    Aphasia    Bronchitis    hx of    Cellulitis and abscess of toe    hx of    Closed supracondylar fracture of right femur, periprosthetic 06/01/2014   Constipation    Cystostomy in place [facility])    Decubitus ulcer    Decubitus ulcer of ischial area    Right   Degenerative arthritis    osteoarthritis    Depression    Diabetes mellitus without complication (HCC)    type 2    Edema    localized    Full incontinence of feces    GERD (gastroesophageal reflux disease)    Gout    hx of    History of falling    Hyperlipidemia    Hypertension    Hyperthyroidism    Intertrochanteric fracture (HCC)    Left, chronic   Left perineal ischial pressure ulcer 09/18/2021   Multiple sclerosis (HCC)    Advanced   Neurogenic bowel    Neuromuscular disorder (HCC)    Bilateral hand carpal tunnel syndrome   Neuromuscular dysfunction of bladder    Nontoxic multinodular goiter    Obesity    Osteomyelitis of coccyx ([place]) 04/05/2021   Osteoporosis    Other adrenocortical insufficiency (HCC)    Paraplegia (HCC)    LEGS   Personal history of urinary (tract) infections    Polyneuropathy    Pressure ulcer of right buttock, stage 4 (HCC)    Sacral decubitus ulcer    stage IV decubitus ulcer   Sacral osteomyelitis ([place]) 04/05/2021   Spinal stenosis in cervical region    Spinal stenosis of lumbosacral region    Spinal stenosis, thoracic    Tinea unguium    Vitamin D deficiency       ALLERGIES:  Allergies  Allergen Reactions   Codeine Shortness Of Breath and Other (See Comments)    Difficult breathing and skin problem   Ultram [Tramadol] Shortness Of  Breath and Other (See Comments)    Difficult breathing and skin peeling   Januvia [Sitagliptin] Rash and Other (See Comments)    Blisters  I spent 35 minutes providing this consultation; time includes spent with patient/family, chart review and documentation. More than 50% of the time in this consultation was spent on care coordination.  Thank you for the opportunity to participate in the care of JAKHYA TEKLE Please call our office at [phone] if we can be of additional  assistance.  Note: Portions of this note were generated with Lobbyist. Dictation errors may occur despite best attempts at proofreading.  Teodoro Spray, NP

## 2022-10-11 ENCOUNTER — Other Ambulatory Visit: Payer: Self-pay | Admitting: Urology

## 2022-10-23 NOTE — Patient Instructions (Addendum)
Preop instructions for:   Laura Roberts   Date of Birth:  06/07/1941               Date of Procedure:   Monday, Nov 04, 2022 Procedure:     CYSTOSCOPY BOTOX INJECTION 200 UNITS, SUPRAPUBIC CATHETER CHANGE  Surgeon: Dr. Modena Slater Facility contact:   [facility]  Phone: [phone]       Health Care POA: RN contact name/phone#:  Ruel Favors or [place]  and Fax #: [phone]   Transportation contact phone#: Sharin Mons [phone]   Please send day of procedure:       Current med list  Medications taken the day of procedure confirm time of nothing by mouth status Patient Demographic info( to include DNR status, problem list, allergies) Bring Insurance card and picture ID     [facility] to arrive at [facility]: 8:45 AM    Report to: Admitting (On your left hand side)    Do not eat solid food or drink liquids past midnight the night before your procedure.(To include any tube feedings-must be discontinued)   Take these morning medications only with sips of water (or give through gastrostomy or feeding tube) Baclofen Dimethyl Fumarate Duloxetine Gabapentin Methimazole Metoprolol Pantoprazole Prednisone Solifenacin May use eyedrops if needed  DO NOT TAKE TRULICITY AFTER Monday, October 28, 2022 (7 DAYS PRIOR TO SURGERY)   Note: No Insulin or Diabetic meds should be given or taken the morning of the procedure!   Leave all jewelry and other valuables at place where living( no metal or rings to be worn) No contact lens Women-no make-up, no lotions,perfumes,powders   Any questions day of procedure,call  [facility]-[phone]     Sent from :[facility]                   [phone]                   Fax:[phone]   Sent by :   Davonne Baby BSN, RN

## 2022-10-23 NOTE — Progress Notes (Addendum)
For Anesthesia: PCP - Dr Lacretia Nicks. Deborha Payment  Cardiologist - N/A Neurologist- Epimenio Foot, Pearletha Furl, MD   Chest x-ray - greater than 1 year  in [facility] EKG - 05/27/22 in [facility] Stress Test - N/A ECHO - N/A Cardiac Cath - N/A Pacemaker/ICD device last checked: N/A Pacemaker orders received:N/A Device Rep notified: N/A  Spinal Cord Stimulator: N/A  Sleep Study -  N/A CPAP -  N/A  Fasting Blood Sugar - 87-239 Checks Blood Sugar __2___ times a day Date and result of last Hgb A1c- 10/04/22 (6.7) sent from [facility] in chart  Last dose of GLP1 agonist- Dulaglutide (Trulicity)  GLP1 instructions: Do NOT take after Monday., October 28, 2022  Last dose of SGLT-2 inhibitors-  N/A SGLT-2 instructions: N/A  Blood Thinner Instructions:N/A Aspirin Instructions:N/A Last Dose:N/A  Activity level: Paraplegic  Anesthesia review: MS, DM, HTN, COPD  Patient denies shortness of breath, fever, cough and chest pain at PAT appointment   Patient verbalized understanding of instructions reviewed via telephone.

## 2022-10-31 ENCOUNTER — Encounter (HOSPITAL_COMMUNITY): Payer: Self-pay | Admitting: Urology

## 2022-10-31 NOTE — Progress Notes (Signed)
Laura Roberts from [facility] confirmed she received the pre op instructions for surgery Monday 11/04/22.

## 2022-11-04 ENCOUNTER — Ambulatory Visit (HOSPITAL_BASED_OUTPATIENT_CLINIC_OR_DEPARTMENT_OTHER): Payer: Medicare Other | Admitting: Physician Assistant

## 2022-11-04 ENCOUNTER — Encounter (HOSPITAL_COMMUNITY)
Admission: RE | Disposition: A | Discharge status: Home / Self Care | Payer: Self-pay | Source: Ambulatory Visit | Attending: Urology

## 2022-11-04 ENCOUNTER — Encounter (HOSPITAL_COMMUNITY): Payer: Self-pay | Admitting: Urology

## 2022-11-04 ENCOUNTER — Ambulatory Visit (HOSPITAL_COMMUNITY)
Admission: RE | Admit: 2022-11-04 | Discharge: 2022-11-04 | Disposition: A | Discharge status: Home / Self Care | Payer: Medicare Other | Source: Ambulatory Visit | Attending: Urology | Admitting: Urology

## 2022-11-04 ENCOUNTER — Ambulatory Visit (HOSPITAL_COMMUNITY): Payer: Medicare Other | Admitting: Physician Assistant

## 2022-11-04 DIAGNOSIS — E119 Type 2 diabetes mellitus without complications: Secondary | ICD-10-CM

## 2022-11-04 DIAGNOSIS — Z79899 Other long term (current) drug therapy: Secondary | ICD-10-CM | POA: Diagnosis not present

## 2022-11-04 DIAGNOSIS — D473 Essential (hemorrhagic) thrombocythemia: Secondary | ICD-10-CM

## 2022-11-04 DIAGNOSIS — J449 Chronic obstructive pulmonary disease, unspecified: Secondary | ICD-10-CM | POA: Insufficient documentation

## 2022-11-04 DIAGNOSIS — K219 Gastro-esophageal reflux disease without esophagitis: Secondary | ICD-10-CM | POA: Diagnosis not present

## 2022-11-04 DIAGNOSIS — I10 Essential (primary) hypertension: Secondary | ICD-10-CM

## 2022-11-04 DIAGNOSIS — E0590 Thyrotoxicosis, unspecified without thyrotoxic crisis or storm: Secondary | ICD-10-CM

## 2022-11-04 DIAGNOSIS — N319 Neuromuscular dysfunction of bladder, unspecified: Secondary | ICD-10-CM | POA: Insufficient documentation

## 2022-11-04 DIAGNOSIS — Z01818 Encounter for other preprocedural examination: Secondary | ICD-10-CM

## 2022-11-04 DIAGNOSIS — Z1152 Encounter for screening for COVID-19: Secondary | ICD-10-CM | POA: Diagnosis not present

## 2022-11-04 DIAGNOSIS — E1140 Type 2 diabetes mellitus with diabetic neuropathy, unspecified: Secondary | ICD-10-CM

## 2022-11-04 HISTORY — PX: BOTOX INJECTION: SHX5754

## 2022-11-04 HISTORY — DX: Chronic obstructive pulmonary disease, unspecified: J44.9

## 2022-11-04 HISTORY — PX: INSERTION OF SUPRAPUBIC CATHETER: SHX5870

## 2022-11-04 LAB — CBC
HCT: 40.6 % (ref 36.0–46.0)
Hemoglobin: 12.9 g/dL (ref 12.0–15.0)
MCH: 29.3 pg (ref 26.0–34.0)
MCHC: 31.8 g/dL (ref 30.0–36.0)
MCV: 92.1 fL (ref 80.0–100.0)
Platelets: 254 10*3/uL (ref 150–400)
RBC: 4.41 MIL/uL (ref 3.87–5.11)
RDW: 14.9 % (ref 11.5–15.5)
WBC: 10.8 10*3/uL — ABNORMAL HIGH (ref 4.0–10.5)
nRBC: 0 % (ref 0.0–0.2)

## 2022-11-04 LAB — GLUCOSE, CAPILLARY
Glucose-Capillary: 104 mg/dL — ABNORMAL HIGH (ref 70–99)
Glucose-Capillary: 85 mg/dL (ref 70–99)

## 2022-11-04 LAB — BASIC METABOLIC PANEL
Anion gap: 6 (ref 5–15)
BUN: 26 mg/dL — ABNORMAL HIGH (ref 8–23)
CO2: 33 mmol/L — ABNORMAL HIGH (ref 22–32)
Calcium: 9.3 mg/dL (ref 8.9–10.3)
Chloride: 100 mmol/L (ref 98–111)
Creatinine, Ser: 0.55 mg/dL (ref 0.44–1.00)
GFR, Estimated: >60 mL/min (ref >60)
Glucose, Bld: 115 mg/dL — ABNORMAL HIGH (ref 70–99)
Potassium: 4.5 mmol/L (ref 3.5–5.1)
Sodium: 139 mmol/L (ref 135–145)

## 2022-11-04 SURGERY — BOTOX INJECTION
Anesthesia: General

## 2022-11-04 MED ORDER — ACETAMINOPHEN 10 MG/ML IV SOLN: 1000.0000 mg | Freq: Once | INTRAVENOUS | Status: DC | PRN

## 2022-11-04 MED ORDER — ONABOTULINUMTOXINA 100 UNITS IJ SOLR
INTRAMUSCULAR | Status: DC | PRN
  Administered 2022-11-04: 200 [IU] via INTRAMUSCULAR

## 2022-11-04 MED ORDER — ONDANSETRON HCL 4 MG/2ML IJ SOLN
INTRAMUSCULAR | Status: AC
  Filled 2022-11-04: qty 2

## 2022-11-04 MED ORDER — CHLORHEXIDINE GLUCONATE 0.12 % MT SOLN
15.0000 mL | Freq: Once | OROMUCOSAL | Status: AC
  Administered 2022-11-04: 15 mL via OROMUCOSAL

## 2022-11-04 MED ORDER — LACTATED RINGERS IV SOLN: INTRAVENOUS | Status: DC

## 2022-11-04 MED ORDER — ONABOTULINUMTOXINA 100 UNITS IJ SOLR
INTRAMUSCULAR | Status: AC
  Filled 2022-11-04: qty 200

## 2022-11-04 MED ORDER — PROPOFOL 10 MG/ML IV BOLUS
INTRAVENOUS | Status: DC | PRN
  Administered 2022-11-04: 120 mg via INTRAVENOUS

## 2022-11-04 MED ORDER — AMISULPRIDE (ANTIEMETIC) 5 MG/2ML IV SOLN: 10.0000 mg | Freq: Once | INTRAVENOUS | Status: DC | PRN

## 2022-11-04 MED ORDER — OXYCODONE HCL 5 MG PO TABS: 5.0000 mg | ORAL_TABLET | Freq: Once | ORAL | Status: DC | PRN

## 2022-11-04 MED ORDER — LIDOCAINE 2% (20 MG/ML) 5 ML SYRINGE
INTRAMUSCULAR | Status: DC | PRN
  Administered 2022-11-04: 40 mg via INTRAVENOUS

## 2022-11-04 MED ORDER — PHENYLEPHRINE 80 MCG/ML (10ML) SYRINGE FOR IV PUSH (FOR BLOOD PRESSURE SUPPORT)
PREFILLED_SYRINGE | INTRAVENOUS | Status: DC | PRN
  Administered 2022-11-04 (×3): 80 ug via INTRAVENOUS

## 2022-11-04 MED ORDER — FENTANYL CITRATE PF 50 MCG/ML IJ SOSY: 25.0000 ug | PREFILLED_SYRINGE | INTRAMUSCULAR | Status: DC | PRN

## 2022-11-04 MED ORDER — SODIUM CHLORIDE 0.9 % IV SOLN
1.0000 g | INTRAVENOUS | Status: AC
  Administered 2022-11-04: 1 g via INTRAVENOUS
  Filled 2022-11-04: qty 20

## 2022-11-04 MED ORDER — FENTANYL CITRATE (PF) 100 MCG/2ML IJ SOLN
INTRAMUSCULAR | Status: DC | PRN
  Administered 2022-11-04: 25 ug via INTRAVENOUS

## 2022-11-04 MED ORDER — PROPOFOL 10 MG/ML IV BOLUS
INTRAVENOUS | Status: AC
  Filled 2022-11-04: qty 20

## 2022-11-04 MED ORDER — ACETAMINOPHEN 160 MG/5ML PO SOLN: 325.0000 mg | ORAL | Status: DC | PRN

## 2022-11-04 MED ORDER — ORAL CARE MOUTH RINSE: 15.0000 mL | Freq: Once | OROMUCOSAL | Status: AC

## 2022-11-04 MED ORDER — METOPROLOL SUCCINATE ER 25 MG PO TB24
50.0000 mg | ORAL_TABLET | Freq: Every day | ORAL | Status: DC
  Administered 2022-11-04: 50 mg via ORAL
  Filled 2022-11-04: qty 2

## 2022-11-04 MED ORDER — PROMETHAZINE HCL 25 MG/ML IJ SOLN: 6.2500 mg | INTRAMUSCULAR | Status: DC | PRN

## 2022-11-04 MED ORDER — DEXAMETHASONE SODIUM PHOSPHATE 10 MG/ML IJ SOLN
INTRAMUSCULAR | Status: DC | PRN
  Administered 2022-11-04: 4 mg via INTRAVENOUS

## 2022-11-04 MED ORDER — OXYCODONE HCL 5 MG/5ML PO SOLN: 5.0000 mg | Freq: Once | ORAL | Status: DC | PRN

## 2022-11-04 MED ORDER — FENTANYL CITRATE (PF) 100 MCG/2ML IJ SOLN
INTRAMUSCULAR | Status: AC
  Filled 2022-11-04: qty 2

## 2022-11-04 MED ORDER — ONDANSETRON HCL 4 MG/2ML IJ SOLN
INTRAMUSCULAR | Status: DC | PRN
  Administered 2022-11-04: 4 mg via INTRAVENOUS

## 2022-11-04 MED ORDER — SODIUM CHLORIDE 0.9 % IR SOLN
Status: DC | PRN
  Administered 2022-11-04: 3000 mL via INTRAVESICAL

## 2022-11-04 MED ORDER — STERILE WATER FOR IRRIGATION IR SOLN
Status: DC | PRN
  Administered 2022-11-04: 1000 mL

## 2022-11-04 MED ORDER — SODIUM CHLORIDE (PF) 0.9 % IJ SOLN
INTRAMUSCULAR | Status: AC
  Filled 2022-11-04: qty 20

## 2022-11-04 MED ORDER — ACETAMINOPHEN 325 MG PO TABS: 325.0000 mg | ORAL_TABLET | ORAL | Status: DC | PRN

## 2022-11-04 MED ORDER — DEXAMETHASONE SODIUM PHOSPHATE 10 MG/ML IJ SOLN
INTRAMUSCULAR | Status: AC
  Filled 2022-11-04: qty 1

## 2022-11-04 SURGICAL SUPPLY — 34 items
BAG COUNTER SPONGE SURGICOUNT (BAG) IMPLANT
BAG DRN RND TRDRP ANRFLXCHMBR (UROLOGICAL SUPPLIES) ×1
BAG SPNG CNTER NS LX DISP (BAG)
BAG URINE DRAIN 2000ML AR STRL (UROLOGICAL SUPPLIES) ×1 IMPLANT
BAG URINE LEG 500ML (DRAIN) ×1 IMPLANT
BAG URO CATCHER STRL LF (MISCELLANEOUS) ×1 IMPLANT
BLADE SURG 15 STRL LF DISP TIS (BLADE) ×1 IMPLANT
BLADE SURG 15 STRL SS (BLADE)
CATH FOLEY 2WAY SLVR  5CC 20FR (CATHETERS) ×1
CATH FOLEY 2WAY SLVR 5CC 20FR (CATHETERS) ×1 IMPLANT
CATH URETL OPEN 5X70 (CATHETERS) IMPLANT
CLOTH BEACON ORANGE TIMEOUT ST (SAFETY) ×1 IMPLANT
ELECT REM PT RETURN 15FT ADLT (MISCELLANEOUS) ×1 IMPLANT
GLOVE BIO SURGEON STRL SZ7.5 (GLOVE) ×1 IMPLANT
GLOVE SURG LX STRL 7.5 STRW (GLOVE) ×1 IMPLANT
GLOVE SURG SS PI 8.0 STRL IVOR (GLOVE) ×1 IMPLANT
GOWN STRL REUS W/ TWL XL LVL3 (GOWN DISPOSABLE) ×2 IMPLANT
GOWN STRL REUS W/TWL XL LVL3 (GOWN DISPOSABLE) ×2
KIT TURNOVER KIT A (KITS) IMPLANT
MANIFOLD NEPTUNE II (INSTRUMENTS) ×1 IMPLANT
NDL ASPIRATION 22 (NEEDLE) ×1 IMPLANT
NDL HYPO 22X1.5 SAFETY MO (MISCELLANEOUS) ×1 IMPLANT
NDL SAFETY ECLIP 18X1.5 (MISCELLANEOUS) IMPLANT
NEEDLE ASPIRATION 22 (NEEDLE) ×1 IMPLANT
NEEDLE HYPO 22X1.5 SAFETY MO (MISCELLANEOUS) ×1 IMPLANT
PACK CYSTO (CUSTOM PROCEDURE TRAY) ×1 IMPLANT
PENCIL SMOKE EVACUATOR (MISCELLANEOUS) IMPLANT
PLUG CATH AND CAP STER (CATHETERS) ×1 IMPLANT
SPONGE DRAIN TRACH 4X4 STRL 2S (GAUZE/BANDAGES/DRESSINGS) ×1 IMPLANT
SUT ETHILON 2 0 PS N (SUTURE) ×1 IMPLANT
SYR CONTROL 10ML LL (SYRINGE) IMPLANT
TOWEL OR 17X26 10 PK STRL BLUE (TOWEL DISPOSABLE) ×1 IMPLANT
TUBING CONNECTING 10 (TUBING) IMPLANT
WATER STERILE IRR 3000ML UROMA (IV SOLUTION) ×1 IMPLANT

## 2022-11-04 NOTE — Op Note (Signed)
Operative Note  Preoperative diagnosis:  1.  Neurogenic bladder  Postoperative diagnosis: 1.  Neurogenic bladder  Procedure(s): 1.  Diascopy with 200 units of intra detrusor Botox 2.  Simple suprapubic tube exchange  Surgeon: Modena Slater, MD  Assistants: None  Anesthesia: General  Complications: None immediate  EBL: Minimal  Specimens: 1.  None  Drains/Catheters: 1.  20 French suprapubic tube  Intraoperative findings: She has a very patulous urethra.  Wide open.  Bladder mucosa with chronic catheter edema.  No obvious tumors.  Small capacity bladder.  Indication: 82 year old female with neurogenic bladder managed with intra detrusor Botox and suprapubic tube presents for the previously mentioned operation.  Description of procedure:  The patient was identified and consent was obtained.  The patient was taken to the operating room and placed in the supine position.  The patient was placed under general anesthesia.  Perioperative antibiotics were administered.  The patient was placed in dorsal lithotomy.  Patient was prepped and draped in a standard sterile fashion and a timeout was performed.  A rigid cystoscope was advanced into the urethra and into the bladder.  Complete cystoscopy was performed with findings noted above.  I then systematically injected 200 units of Botox throughout the bladder.  There is no significant active bleeding and I withdrew the scope.  20 French suprapubic tube was replaced and this concluded the operation.  Patient tolerated the procedure well was stable postoperatively.  Plan: Repeat Botox in 6 months.

## 2022-11-04 NOTE — Transfer of Care (Signed)
Immediate Anesthesia Transfer of Care Note  Patient: Laura Roberts  Procedure(s) Performed: CYSTOSCOPY BOTOX INJECTION 200 UNITS SUPRAPUBIC CATHETER CHANGE  Patient Location: PACU  Anesthesia Type:General  Level of Consciousness: awake, alert , and patient cooperative  Airway & Oxygen Therapy: Patient Spontanous Breathing and Patient connected to face mask oxygen  Post-op Assessment: Report given to RN  Post vital signs: Reviewed and stable  Last Vitals:  Vitals Value Taken Time  BP 129/71   Temp    Pulse 83 11/04/22 1219  Resp 13 11/04/22 1219  SpO2 100 % 11/04/22 1219  Vitals shown include unvalidated device data.  Last Pain:  Vitals:   11/04/22 1006  TempSrc: Oral         Complications: No notable events documented.

## 2022-11-04 NOTE — Anesthesia Procedure Notes (Signed)
Procedure Name: LMA Insertion Date/Time: 11/04/2022 11:40 AM  Performed by: Sindy Guadeloupe, CRNAPre-anesthesia Checklist: Patient identified, Emergency Drugs available, Suction available, Patient being monitored and Timeout performed Patient Re-evaluated:Patient Re-evaluated prior to induction Oxygen Delivery Method: Circle system utilized Preoxygenation: Pre-oxygenation with 100% oxygen Induction Type: IV induction Ventilation: Mask ventilation without difficulty LMA: LMA inserted and LMA with gastric port inserted LMA Size: 4.0 Tube secured with: Tape Dental Injury: Teeth and Oropharynx as per pre-operative assessment

## 2022-11-04 NOTE — Anesthesia Preprocedure Evaluation (Signed)
Anesthesia Evaluation  Patient identified by MRN, date of birth, ID band Patient awake    Reviewed: Allergy & Precautions, NPO status , Patient's Chart, lab work & pertinent test results, reviewed documented beta blocker date and time   Airway Mallampati: III  TM Distance: >3 FB Neck ROM: Full    Dental  (+) Teeth Intact   Pulmonary COPD   breath sounds clear to auscultation       Cardiovascular hypertension, Pt. on medications and Pt. on home beta blockers  Rhythm:Regular Rate:Normal     Neuro/Psych  PSYCHIATRIC DISORDERS  Depression       GI/Hepatic Neg liver ROS,GERD  Medicated,,  Endo/Other  diabetes Hyperthyroidism   Renal/GU negative Renal ROS     Musculoskeletal  (+) Arthritis ,    Abdominal   Peds  Hematology   Anesthesia Other Findings   Reproductive/Obstetrics                             Anesthesia Physical Anesthesia Plan  ASA: 3  Anesthesia Plan: General   Post-op Pain Management: Tylenol PO (pre-op)*   Induction: Intravenous  PONV Risk Score and Plan: 4 or greater and Ondansetron and Treatment may vary due to age or medical condition  Airway Management Planned: LMA  Additional Equipment: None  Intra-op Plan:   Post-operative Plan: Extubation in OR  Informed Consent: I have reviewed the patients History and Physical, chart, labs and discussed the procedure including the risks, benefits and alternatives for the proposed anesthesia with the patient or authorized representative who has indicated his/her understanding and acceptance.     Dental advisory given  Plan Discussed with: CRNA  Anesthesia Plan Comments:         Anesthesia Quick Evaluation

## 2022-11-04 NOTE — Discharge Instructions (Addendum)
He may notice some blood in the urine.  This is okay as long as your catheter is draining well.

## 2022-11-04 NOTE — H&P (Signed)
H&P  Chief Complaint: Neurogenic bladder  History of Present Illness: 82 year old female with neurogenic bladder managed with Botox and suprapubic tube presents for repeat Botox.  Past Medical History:  Diagnosis Date   Age related osteoporosis    Anemia    Aphasia    Bronchitis    hx of    Cellulitis and abscess of toe    hx of    Closed supracondylar fracture of right femur, periprosthetic 06/01/2014   Constipation    COPD (chronic obstructive pulmonary disease) (HCC)    Cystostomy in place [facility])    Decubitus ulcer    Decubitus ulcer of ischial area    Right   Degenerative arthritis    osteoarthritis    Depression    Diabetes mellitus without complication (HCC)    type 2    Edema    localized    Full incontinence of feces    GERD (gastroesophageal reflux disease)    Gout    hx of    History of falling    Hyperlipidemia    Hypertension    Hyperthyroidism    Intertrochanteric fracture (HCC)    Left, chronic   Left perineal ischial pressure ulcer 09/18/2021   Multiple sclerosis (HCC)    Advanced   Neurogenic bowel    Neuromuscular disorder (HCC)    Bilateral hand carpal tunnel syndrome   Neuromuscular dysfunction of bladder    Nontoxic multinodular goiter    Obesity    Osteomyelitis of coccyx (HCC) 04/05/2021   Osteoporosis    Other adrenocortical insufficiency (HCC)    Paraplegia (HCC)    LEGS   Personal history of urinary (tract) infections    Polyneuropathy    Pressure ulcer of right buttock, stage 4 (HCC)    Sacral decubitus ulcer    stage IV decubitus ulcer   Sacral osteomyelitis (HCC) 04/05/2021   Spinal stenosis in cervical region    Spinal stenosis of lumbosacral region    Spinal stenosis, thoracic    Tinea unguium    Vitamin D deficiency    Past Surgical History:  Procedure Laterality Date   ABDOMINAL HYSTERECTOMY     APPLICATION OF WOUND VAC  04/07/2021   Procedure: APPLICATION OF WOUND VAC;  Surgeon: Quentin Ore, MD;  Location:  [facility];  Service: General;;   BACK SURGERY     BOTOX INJECTION N/A 01/30/2018   Procedure: CYSTOSCOPY BOTOX INJECTION, SUPRAPUBIC EXCHANGE;  Surgeon: Crista Elliot, MD;  Location: [facility];  Service: Urology;  Laterality: N/A;   BOTOX INJECTION N/A 09/02/2018   Procedure: BOTOX INJECTION;  Surgeon: Crista Elliot, MD;  Location: [facility];  Service: Urology;  Laterality: N/A;   BOTOX INJECTION N/A 05/10/2019   Procedure: CYSTOSCOPY BOTOX INJECTION, SUPRAPUBIC EXCHANGE;  Surgeon: Crista Elliot, MD;  Location: [facility];  Service: Urology;  Laterality: N/A;   BOTOX INJECTION N/A 09/06/2019   Procedure: CYSTOSCOPY BOTOX INJECTION;  Surgeon: Crista Elliot, MD;  Location: [facility];  Service: Urology;  Laterality: N/A;   BOTOX INJECTION N/A 04/10/2020   Procedure: CYSTOSCOPY BOTOX INJECTION;  Surgeon: Jerilee Field, MD;  Location: [facility];  Service: Urology;  Laterality: N/A;   BOTOX INJECTION N/A 09/13/2020   Procedure: CYSTOSCOPYBOTOX INJECTION SUPRAPUBIC TUBE UPSIZE AND EXCHANGE;  Surgeon: Crista Elliot, MD;  Location: [facility];  Service: Urology;  Laterality: N/A;   BOTOX INJECTION N/A 03/07/2021   Procedure: CYSTOSCOPY BOTOX INJECTION AND SUPRAPUBIC TUBE EXCHANGE;  Surgeon:  Crista Elliot, MD;  Location: [facility];  Service: Urology;  Laterality: N/A;  REQUESTING 46 MINS FOR CASE   BOTOX INJECTION N/A 09/03/2021   Procedure: CYSTOSCOPY BOTOX INJECTION;  Surgeon: Crista Elliot, MD;  Location: [facility];  Service: Urology;  Laterality: N/A;   BOTOX INJECTION N/A 04/01/2022   Procedure: CYSTOSCOPYBOTOX INJECTION;  Surgeon: Crista Elliot, MD;  Location: [facility];  Service: Urology;  Laterality: N/A;   CATARACT EXTRACTION Right    CHOLECYSTECTOMY     COLON SURGERY  2023   w/ colostomy   CYSTOSCOPY N/A 09/02/2018   Procedure: CYSTOSCOPY;  Surgeon: Crista Elliot, MD;  Location: [facility];  Service: Urology;  Laterality: N/A;   CYSTOSTOMY N/A 09/02/2018   Procedure: CYSTOSTOMY  SUPRAPUBIC;  Surgeon: Crista Elliot, MD;  Location: [facility];  Service: Urology;  Laterality: N/A;  45 MINS   FEMUR IM NAIL Right 06/02/2014   Procedure: INTRAMEDULLARY (IM) RETROGRADE FEMORAL NAILING;  Surgeon: Eulas Post, MD;  Location: [facility];  Service: Orthopedics;  Laterality: Right;   GROIN DISSECTION Left 10/24/2017   Procedure: IRRIGATION AND DEBRIDEMENT, LEFT THIGH ABCESS ;  Surgeon: Ovidio Kin, MD;  Location: [facility];  Service: General;  Laterality: Left;   INCISION AND DRAINAGE PERIRECTAL ABSCESS N/A 04/07/2021   Procedure: DEBRIDEMENT OF SACRAL DECUBITUS ULCER;  Surgeon: Quentin Ore, MD;  Location: [facility];  Service: General;  Laterality: N/A;   INSERTION OF SUPRAPUBIC CATHETER N/A 09/06/2019   Procedure: SUPRAPUBIC TUBE CHANGE;  Surgeon: Crista Elliot, MD;  Location: [facility];  Service: Urology;  Laterality: N/A;   INSERTION OF SUPRAPUBIC CATHETER N/A 04/10/2020   Procedure: SUPRAPUBIC CATHETER EXCHANGE/TRACT  DILATION;  Surgeon: Jerilee Field, MD;  Location: [facility];  Service: Urology;  Laterality: N/A;   INSERTION OF SUPRAPUBIC CATHETER N/A 09/03/2021   Procedure: SUPRAPUBIC CATHETER EXCHANGE;  Surgeon: Crista Elliot, MD;  Location: [facility];  Service: Urology;  Laterality: N/A;  45 MINS FOR THIS CASE   INSERTION OF SUPRAPUBIC CATHETER N/A 04/01/2022   Procedure: SUPRAPUBIC CATHETER EXCHANGE UPSIZING & DILATION OF SUPRAPUBIC  TRACT;  Surgeon: Crista Elliot, MD;  Location: [facility];  Service: Urology;  Laterality: N/A;  45 MINS FOR CASE   INSERTION OF SUPRAPUBIC CATHETER N/A 05/27/2022   Procedure: CYSTOSCOPY SUPRAPUBIC TUBE TRACT DILATION AND SUPRAPUBIC TUBE EXCHANGE;  Surgeon: Crista Elliot, MD;  Location: [facility];  Service: Urology;  Laterality: N/A;  45 MINS FOR CASE   IR CATHETER TUBE CHANGE  10/13/2017   IR CATHETER TUBE CHANGE  07/15/2018   IR CATHETER TUBE CHANGE  08/26/2019   IR CATHETER TUBE CHANGE  11/25/2019   IR CATHETER TUBE CHANGE   01/06/2020   IR CATHETER TUBE CHANGE  06/13/2020   IR CATHETER TUBE CHANGE  08/01/2020   LAPAROSCOPY N/A 04/07/2021   Procedure: LAPAROSCOPY DIAGNOSTIC; LAPAROSCOPIC CREATION OF TRANSVERSE COLOSTOMY;  Surgeon: Quentin Ore, MD;  Location: [facility];  Service: General;  Laterality: N/A;   left hand carpal tunnel surgery     REPLACEMENT TOTAL KNEE BILATERAL  07/01/2002    Home Medications:  No medications prior to admission.   Allergies:  Allergies  Allergen Reactions   Codeine Shortness Of Breath and Other (See Comments)    Difficult breathing and skin problem   Ultram [Tramadol] Shortness Of Breath and Other (See Comments)    Difficult breathing and skin peeling   Januvia [Sitagliptin]  Rash and Other (See Comments)    Blisters    Family History  Problem Relation Age of Onset   Multiple sclerosis Other        neices.    Cancer Mother    Diabetes Mother    GI Bleed Sister        diverticulitis   Thyroid disease Neg Hx    Social History:  reports that she has never smoked. She has never used smokeless tobacco. She reports that she does not drink alcohol and does not use drugs.  ROS: A complete review of systems was performed.  All systems are negative except for pertinent findings as noted. ROS   Physical Exam:  Vital signs in last 24 hours:   General:  Alert and oriented, No acute distress HEENT: Normocephalic, atraumatic Neck: No JVD or lymphadenopathy Cardiovascular: Regular rate and rhythm Lungs: Regular rate and effort Abdomen: Soft, nontender, nondistended, no abdominal masses Back: No CVA tenderness Extremities: No edema Neurologic: Grossly intact  Laboratory Data:  No results found for this or any previous visit (from the past 24 hour(s)). No results found for this or any previous visit (from the past 240 hour(s)). Creatinine: No results for input(s): "CREATININE" in the last 168 hours.  Impression/Assessment:  Neurogenic bladder with detrusor  overactivity  Plan:  Proceed with cystoscopy with intra detrusor Botox injection, 200 units, suprapubic tube exchange  Ray Church, III 11/04/2022, 9:47 AM

## 2022-11-04 NOTE — Anesthesia Postprocedure Evaluation (Signed)
Anesthesia Post Note  Patient: Laura Roberts  Procedure(s) Performed: CYSTOSCOPY BOTOX INJECTION 200 UNITS SUPRAPUBIC CATHETER CHANGE     Patient location during evaluation: PACU Anesthesia Type: General Level of consciousness: awake and alert Pain management: pain level controlled Vital Signs Assessment: post-procedure vital signs reviewed and stable Respiratory status: spontaneous breathing, nonlabored ventilation, respiratory function stable and patient connected to nasal cannula oxygen Cardiovascular status: blood pressure returned to baseline and stable Postop Assessment: no apparent nausea or vomiting Anesthetic complications: no  No notable events documented.  Last Vitals:  Vitals:   11/04/22 1300 11/04/22 1310  BP:  121/71  Pulse:  83  Resp:    Temp: 36.5 C   SpO2:  97%    Last Pain:  Vitals:   11/04/22 1245  TempSrc:   PainSc: 0-No pain                 Shelton Silvas

## 2022-11-05 ENCOUNTER — Encounter (HOSPITAL_COMMUNITY): Payer: Self-pay | Admitting: Urology

## 2022-11-05 LAB — SURGICAL PCR SCREEN
MRSA, PCR: POSITIVE — AB
Staphylococcus aureus: POSITIVE — AB

## 2022-11-05 NOTE — Progress Notes (Signed)
Call rec from Lab reporting that patient was Positive for Staph and MRSA, phone call made to Dr. Langston Masker, MD.

## 2022-11-13 ENCOUNTER — Non-Acute Institutional Stay: Payer: Medicare Other | Admitting: Hospice

## 2022-11-13 DIAGNOSIS — N319 Neuromuscular dysfunction of bladder, unspecified: Secondary | ICD-10-CM

## 2022-11-13 DIAGNOSIS — Z515 Encounter for palliative care: Secondary | ICD-10-CM

## 2022-11-13 DIAGNOSIS — G35 Multiple sclerosis: Secondary | ICD-10-CM

## 2022-11-13 DIAGNOSIS — R531 Weakness: Secondary | ICD-10-CM

## 2022-11-13 DIAGNOSIS — M4628 Osteomyelitis of vertebra, sacral and sacrococcygeal region: Secondary | ICD-10-CM

## 2022-11-13 NOTE — Progress Notes (Signed)
Therapist, nutritional Palliative Care Consult Note Telephone: [phone]  Fax: [phone]  PATIENT NAME: Laura Roberts DOB: May 28, 1941 MRN: [id number]  PRIMARY CARE PROVIDER:   Dr Rushie Chestnut  REFERRING PROVIDER: Dr Milas Gain   RESPONSIBLE PARTY:  Self Contact Information     Name Relation Home Work Mobile   [place] Son [phone]  [phone]   [name], [name] [phone]  [phone]       Visit is to build trust and highlight Palliative Medicine as specialized medical care for people living with serious illness, aimed at facilitating better quality of life through symptoms relief, assisting with advance care planning and complex medical decision making. This is a follow up visit.  Visit consisted of counseling and education dealing with the complex and emotionally intense issues of symptom management and palliative care in the setting of serious and potentially life-threatening illness.  Palliative care team will continue to support patient, patient's family, and medical team.  RECOMMENDATIONS/PLAN:   Code Status: Patient is a DNR.    Goals of Care: Goals of care include to maximize quality of life and symptom management. MOST form selections  include DO NOT RESUSCITATE, limited additional interventions, IV fluids as indicated, antibiotics as indicated, no feeding tube  Palliative care team will continue to support patient, patient's family, and medical team.  Symptom management/Plan:  Sacral Osteomyelitis: Wound continues to improve per wound nurse; paient discharged by Infectious Disease.  Continue daily dressing changes ordered. Continue Prostat, med pass and mutivitamin to help promote healing. Continue wound offloading with diverting colostomy.   Weakness: Continue with facility restorative exercises 5 days a week - passive ROM leg exercises,  hand exercises, weights etc.. Encourage activity as tolerated to optimize wellbeing. Fall  precautions.  Multiple sclerosis: advanced - nonambulatory, Hoyer lift for all transfers.  Continue Dimethyl Fumarate, Neurontin, baclofen. Continue with restorative exercises and ongoing occupational therapy. Gets around with her power chair. Neurologist consult as planned.  Left shoulder pain: Improved, continue Volteren gel 1% BID prn pain.   Neurogenic bladder: Last Botox injection 11/04/2022. Managed with suprapubic catheter for neurogenic bladder as well as intravesical Botox. Followed by urologist. Dorathy Kinsman Botox injections and Solifenacin for bladder spasm  Follow up: Palliative care will continue to follow for complex medical decision making, advance care planning, and clarification of goals. Return 4 weeks or prn. Encouraged to call provider sooner with any concerns.  CHIEF COMPLAINT: Palliative follow up  HISTORY OF PRESENT ILLNESS:  Laura Roberts a 82 y.o. female with multiple morbidities requiring close monitoring/management with high risk of complications and morbidity: Sacral osteomyelitis, multiple sclerosis, muscle spasm, type 2 diabetes mellitus, depression, neurogenic bladder. Patient in no distress, reports overall doing well; concern for swelling to left foot. Xray was negative, encouraged to elevate and report pain or changes. History obtained from review of EMR, discussion with primary team, family and/or patient. Records reviewed and summarized above. All 10 point systems reviewed and are negative except as documented in history of present illness above  Review and summarization of Epic records shows history from other than patient.   Palliative Care was asked to follow this patient o help address complex decision making in the context of advance care planning and goals of care clarification. I reviewed, as needed, available labs, patient records, imaging, studies and related documents from the EMR.    PERTINENT MEDICATIONS:  Outpatient Encounter Medications as of 11/13/2022   Medication Sig   acetaminophen (TYLENOL) 325 MG tablet  Take 650 mg by mouth every 6 (six) hours as needed for mild pain or fever.    baclofen (LIORESAL) 10 MG tablet Take 10-20 mg by mouth See admin instructions. Take 15 mg by mouth in the morning, 10 mg midday for spasms, and 20 mg by mouth at bedtime   barrier cream (NON-SPECIFIED) CREA Apply 1 application topically 2 (two) times daily. (0700 & 1900) Apply to right ischium and bilateral buttocks   bisacodyl (DULCOLAX) 10 MG suppository Place 10 mg rectally daily as needed for moderate constipation (constipation not relieved by milk of magnesium).   calcium citrate (CALCITRATE - DOSED IN MG ELEMENTAL CALCIUM) 950 (200 Ca) MG tablet Take 200 mg of elemental calcium by mouth daily. (0800)   Carboxymeth-Glycerin-Polysorb (REFRESH OPTIVE ADVANCED OP) Apply 1 drop to eye 4 (four) times daily as needed (dry eyes).   cholecalciferol (VITAMIN D) 25 MCG (1000 UNIT) tablet Take 1,000 Units by mouth daily. (1000)   clotrimazole (LOTRIMIN) 1 % cream Apply 1 Application topically 2 (two) times daily. Apply to affected areas on scalp. 14 day course ends 11/07/22   cycloSPORINE (RESTASIS) 0.05 % ophthalmic emulsion Place 1 drop into both eyes daily as needed (dry eyes). (2000)   diclofenac Sodium (VOLTAREN) 1 % GEL Apply 2 g topically 2 (two) times daily as needed (pain).   Dimethyl Fumarate 240 MG CPDR One po bid (Patient taking differently: Take 240 mg by mouth 2 (two) times daily.)   Dulaglutide (TRULICITY) 3 MG/0.5ML SOPN Inject 3 mg into the skin every Tuesday. (1000)   DULoxetine (CYMBALTA) 30 MG capsule Take 90 mg by mouth daily.   ferrous sulfate 325 (65 FE) MG tablet Take 325 mg by mouth 2 (two) times daily with a meal. (1000 & 2200)   furosemide (LASIX) 20 MG tablet Take 20 mg by mouth every other day.   gabapentin (NEURONTIN) 100 MG capsule Take 200 mg by mouth 3 (three) times daily. (0800, 1400 & 2000)   hydroxypropyl methylcellulose / hypromellose  (ISOPTO TEARS / GONIOVISC) 2.5 % ophthalmic solution Place 1 drop into both eyes 3 (three) times daily as needed for dry eyes.   magnesium hydroxide (MILK OF MAGNESIA) 400 MG/5ML suspension Take 30 mLs by mouth daily as needed for mild constipation.   magnesium oxide (MAG-OX) 400 MG tablet Take 400 mg by mouth 2 (two) times daily. (1000 & 2200)   meclizine (ANTIVERT) 12.5 MG tablet Take 12.5 mg by mouth every 6 (six) hours as needed for dizziness.   methimazole (TAPAZOLE) 5 MG tablet Take 5 mg by mouth daily at 6 (six) AM.   metoprolol succinate (TOPROL-XL) 50 MG 24 hr tablet Take 50 mg by mouth daily after breakfast. (0800)   Multiple Vitamin (MULTIVITAMIN WITH MINERALS) TABS tablet Take 1 tablet by mouth in the morning. (1000)   ondansetron (ZOFRAN) 4 MG tablet Take 4 mg by mouth every 6 (six) hours as needed for nausea or vomiting.   pantoprazole (PROTONIX) 40 MG tablet Take 40 mg by mouth daily at 6 (six) AM. (0600)   Pollen Extracts (PROSTAT PO) Take 30 mLs by mouth 2 (two) times daily.   polyethylene glycol (MIRALAX / GLYCOLAX) 17 g packet Take 17 g by mouth daily.   predniSONE (DELTASONE) 2.5 MG tablet Take 2.5 mg by mouth every other day. Alternating days with 5 mg dose   predniSONE (DELTASONE) 5 MG tablet Take 5 mg by mouth every other day. Alternating days with 2.5 mg dose   SF 5000  PLUS 1.1 % CREA dental cream Place 1 application  onto teeth daily at 8 pm. (2000) Pea size  Spit our excess and do not rinse water   Sodium Phosphates (RA SALINE ENEMA RE) Place 1 Bottle rectally daily as needed (severe constipation (not relieved by bisacodyl)).   solifenacin (VESICARE) 10 MG tablet Take 10 mg by mouth daily. (0800)   vitamin C (ASCORBIC ACID) 500 MG tablet Take 500 mg by mouth in the morning and at bedtime. (1000 & 2200)   No facility-administered encounter medications on file as of 11/13/2022.    HOSPICE ELIGIBILITY/DIAGNOSIS: TBD  PAST MEDICAL HISTORY:  Past Medical History:   Diagnosis Date   Age related osteoporosis    Anemia    Aphasia    Bronchitis    hx of    Cellulitis and abscess of toe    hx of    Closed supracondylar fracture of right femur, periprosthetic 06/01/2014   Constipation    COPD (chronic obstructive pulmonary disease) (HCC)    Cystostomy in place [facility])    Decubitus ulcer    Decubitus ulcer of ischial area    Right   Degenerative arthritis    osteoarthritis    Depression    Diabetes mellitus without complication (HCC)    type 2    Edema    localized    Full incontinence of feces    GERD (gastroesophageal reflux disease)    Gout    hx of    History of falling    Hyperlipidemia    Hypertension    Hyperthyroidism    Intertrochanteric fracture (HCC)    Left, chronic   Left perineal ischial pressure ulcer 09/18/2021   Multiple sclerosis (HCC)    Advanced   Neurogenic bowel    Neuromuscular disorder (HCC)    Bilateral hand carpal tunnel syndrome   Neuromuscular dysfunction of bladder    Nontoxic multinodular goiter    Obesity    Osteomyelitis of coccyx (HCC) 04/05/2021   Osteoporosis    Other adrenocortical insufficiency (HCC)    Paraplegia (HCC)    LEGS   Personal history of urinary (tract) infections    Polyneuropathy    Pressure ulcer of right buttock, stage 4 (HCC)    Sacral decubitus ulcer    stage IV decubitus ulcer   Sacral osteomyelitis (HCC) 04/05/2021   Spinal stenosis in cervical region    Spinal stenosis of lumbosacral region    Spinal stenosis, thoracic    Tinea unguium    Vitamin D deficiency       ALLERGIES:  Allergies  Allergen Reactions   Codeine Shortness Of Breath and Other (See Comments)    Difficult breathing and skin problem   Ultram [Tramadol] Shortness Of Breath and Other (See Comments)    Difficult breathing and skin peeling   Januvia [Sitagliptin] Rash and Other (See Comments)    Blisters  I spent 35 minutes providing this consultation; time includes spent with patient/family,  chart review and documentation. More than 50% of the time in this consultation was spent on care coordination.  Thank you for the opportunity to participate in the care of Laura Roberts Please call our office at [phone] if we can be of additional assistance.  Note: Portions of this note were generated with Scientist, clinical (histocompatibility and immunogenetics). Dictation errors may occur despite best attempts at proofreading.  Rosaura Carpenter, NP

## 2023-02-12 ENCOUNTER — Telehealth: Payer: Self-pay | Admitting: Neurology

## 2023-02-12 ENCOUNTER — Encounter: Payer: Self-pay | Admitting: Neurology

## 2023-02-12 NOTE — Telephone Encounter (Signed)
LVM and sent letter in mail informing pt of need to reschedule 05/28/23 appt - NP out

## 2023-02-27 ENCOUNTER — Emergency Department (HOSPITAL_COMMUNITY): Payer: Medicare Other

## 2023-02-27 ENCOUNTER — Other Ambulatory Visit: Payer: Self-pay

## 2023-02-27 ENCOUNTER — Encounter (HOSPITAL_COMMUNITY): Payer: Self-pay | Admitting: Radiology

## 2023-02-27 ENCOUNTER — Inpatient Hospital Stay (HOSPITAL_COMMUNITY)
Admission: EM | Admit: 2023-02-27 | Discharge: 2023-03-05 | DRG: 637 | Disposition: A | Discharge status: Skilled Nursing Facility | Payer: Medicare Other | Source: Skilled Nursing Facility | Attending: Internal Medicine | Admitting: Internal Medicine

## 2023-02-27 DIAGNOSIS — G35 Multiple sclerosis: Secondary | ICD-10-CM | POA: Diagnosis present

## 2023-02-27 DIAGNOSIS — Z933 Colostomy status: Secondary | ICD-10-CM

## 2023-02-27 DIAGNOSIS — Z79899 Other long term (current) drug therapy: Secondary | ICD-10-CM

## 2023-02-27 DIAGNOSIS — N319 Neuromuscular dysfunction of bladder, unspecified: Secondary | ICD-10-CM | POA: Diagnosis present

## 2023-02-27 DIAGNOSIS — Z9359 Other cystostomy status: Secondary | ICD-10-CM

## 2023-02-27 DIAGNOSIS — G8220 Paraplegia, unspecified: Secondary | ICD-10-CM | POA: Diagnosis present

## 2023-02-27 DIAGNOSIS — J449 Chronic obstructive pulmonary disease, unspecified: Secondary | ICD-10-CM | POA: Diagnosis present

## 2023-02-27 DIAGNOSIS — A419 Sepsis, unspecified organism: Principal | ICD-10-CM

## 2023-02-27 DIAGNOSIS — M810 Age-related osteoporosis without current pathological fracture: Secondary | ICD-10-CM | POA: Diagnosis present

## 2023-02-27 DIAGNOSIS — Z888 Allergy status to other drugs, medicaments and biological substances status: Secondary | ICD-10-CM

## 2023-02-27 DIAGNOSIS — R4701 Aphasia: Secondary | ICD-10-CM | POA: Diagnosis present

## 2023-02-27 DIAGNOSIS — M868X9 Other osteomyelitis, unspecified sites: Secondary | ICD-10-CM

## 2023-02-27 DIAGNOSIS — E1140 Type 2 diabetes mellitus with diabetic neuropathy, unspecified: Secondary | ICD-10-CM | POA: Diagnosis present

## 2023-02-27 DIAGNOSIS — M8660 Other chronic osteomyelitis, unspecified site: Secondary | ICD-10-CM | POA: Diagnosis present

## 2023-02-27 DIAGNOSIS — L89152 Pressure ulcer of sacral region, stage 2: Secondary | ICD-10-CM

## 2023-02-27 DIAGNOSIS — E1169 Type 2 diabetes mellitus with other specified complication: Principal | ICD-10-CM | POA: Diagnosis present

## 2023-02-27 DIAGNOSIS — M4628 Osteomyelitis of vertebra, sacral and sacrococcygeal region: Secondary | ICD-10-CM | POA: Diagnosis present

## 2023-02-27 DIAGNOSIS — Z66 Do not resuscitate: Secondary | ICD-10-CM | POA: Diagnosis present

## 2023-02-27 DIAGNOSIS — L89154 Pressure ulcer of sacral region, stage 4: Secondary | ICD-10-CM | POA: Diagnosis present

## 2023-02-27 DIAGNOSIS — Z820 Family history of epilepsy and other diseases of the nervous system: Secondary | ICD-10-CM

## 2023-02-27 DIAGNOSIS — Z833 Family history of diabetes mellitus: Secondary | ICD-10-CM

## 2023-02-27 DIAGNOSIS — R6510 Systemic inflammatory response syndrome (SIRS) of non-infectious origin without acute organ dysfunction: Secondary | ICD-10-CM | POA: Diagnosis present

## 2023-02-27 DIAGNOSIS — Z885 Allergy status to narcotic agent status: Secondary | ICD-10-CM

## 2023-02-27 DIAGNOSIS — Z1152 Encounter for screening for COVID-19: Secondary | ICD-10-CM

## 2023-02-27 DIAGNOSIS — Z7952 Long term (current) use of systemic steroids: Secondary | ICD-10-CM

## 2023-02-27 DIAGNOSIS — M4802 Spinal stenosis, cervical region: Secondary | ICD-10-CM | POA: Diagnosis present

## 2023-02-27 DIAGNOSIS — L8990 Pressure ulcer of unspecified site, unspecified stage: Secondary | ICD-10-CM | POA: Diagnosis present

## 2023-02-27 DIAGNOSIS — M4807 Spinal stenosis, lumbosacral region: Secondary | ICD-10-CM | POA: Diagnosis present

## 2023-02-27 DIAGNOSIS — Z96653 Presence of artificial knee joint, bilateral: Secondary | ICD-10-CM | POA: Diagnosis present

## 2023-02-27 DIAGNOSIS — I445 Left posterior fascicular block: Secondary | ICD-10-CM | POA: Diagnosis present

## 2023-02-27 DIAGNOSIS — E785 Hyperlipidemia, unspecified: Secondary | ICD-10-CM | POA: Diagnosis present

## 2023-02-27 DIAGNOSIS — R6511 Systemic inflammatory response syndrome (SIRS) of non-infectious origin with acute organ dysfunction: Secondary | ICD-10-CM | POA: Diagnosis present

## 2023-02-27 DIAGNOSIS — Z809 Family history of malignant neoplasm, unspecified: Secondary | ICD-10-CM

## 2023-02-27 DIAGNOSIS — I10 Essential (primary) hypertension: Secondary | ICD-10-CM | POA: Diagnosis present

## 2023-02-27 DIAGNOSIS — Z7401 Bed confinement status: Secondary | ICD-10-CM

## 2023-02-27 LAB — I-STAT CHEM 8, ED
BUN: 27 mg/dL — ABNORMAL HIGH (ref 8–23)
Calcium, Ion: 1.16 mmol/L (ref 1.15–1.40)
Chloride: 100 mmol/L (ref 98–111)
Creatinine, Ser: 0.6 mg/dL (ref 0.44–1.00)
Glucose, Bld: 208 mg/dL — ABNORMAL HIGH (ref 70–99)
HCT: 43 % (ref 36.0–46.0)
Hemoglobin: 14.6 g/dL (ref 12.0–15.0)
Potassium: 4.1 mmol/L (ref 3.5–5.1)
Sodium: 137 mmol/L (ref 135–145)
TCO2: 26 mmol/L (ref 22–32)

## 2023-02-27 LAB — COMPREHENSIVE METABOLIC PANEL
ALT: 35 U/L (ref 0–44)
AST: 58 U/L — ABNORMAL HIGH (ref 15–41)
Albumin: 3.8 g/dL (ref 3.5–5.0)
Alkaline Phosphatase: 80 U/L (ref 38–126)
Anion gap: 17 — ABNORMAL HIGH (ref 5–15)
BUN: 26 mg/dL — ABNORMAL HIGH (ref 8–23)
CO2: 23 mmol/L (ref 22–32)
Calcium: 9.4 mg/dL (ref 8.9–10.3)
Chloride: 97 mmol/L — ABNORMAL LOW (ref 98–111)
Creatinine, Ser: 0.55 mg/dL (ref 0.44–1.00)
GFR, Estimated: >60 mL/min (ref >60)
Glucose, Bld: 198 mg/dL — ABNORMAL HIGH (ref 70–99)
Potassium: 4.4 mmol/L (ref 3.5–5.1)
Sodium: 137 mmol/L (ref 135–145)
Total Bilirubin: 1.6 mg/dL — ABNORMAL HIGH (ref 0.3–1.2)
Total Protein: 7.4 g/dL (ref 6.5–8.1)

## 2023-02-27 LAB — URINALYSIS, ROUTINE W REFLEX MICROSCOPIC
Bacteria, UA: NONE SEEN
Bilirubin Urine: NEGATIVE
Glucose, UA: NEGATIVE mg/dL
Hgb urine dipstick: NEGATIVE
Ketones, ur: 20 mg/dL — AB
Nitrite: NEGATIVE
Protein, ur: NEGATIVE mg/dL
Specific Gravity, Urine: <1.005 — ABNORMAL LOW (ref 1.005–1.030)
pH: 5 (ref 5.0–8.0)

## 2023-02-27 LAB — DIFFERENTIAL
Abs Immature Granulocytes: 0.1 10*3/uL — ABNORMAL HIGH (ref 0.00–0.07)
Basophils Absolute: 0 10*3/uL (ref 0.0–0.1)
Basophils Relative: 0 %
Eosinophils Absolute: 0 10*3/uL (ref 0.0–0.5)
Eosinophils Relative: 0 %
Immature Granulocytes: 1 %
Lymphocytes Relative: 2 %
Lymphs Abs: 0.3 10*3/uL — ABNORMAL LOW (ref 0.7–4.0)
Monocytes Absolute: 0.4 10*3/uL (ref 0.1–1.0)
Monocytes Relative: 3 %
Neutro Abs: 13.7 10*3/uL — ABNORMAL HIGH (ref 1.7–7.7)
Neutrophils Relative %: 94 %

## 2023-02-27 LAB — ETHANOL: Alcohol, Ethyl (B): <10 mg/dL (ref <10)

## 2023-02-27 LAB — TROPONIN I (HIGH SENSITIVITY)
Troponin I (High Sensitivity): 7 ng/L (ref <18)
Troponin I (High Sensitivity): 11 ng/L (ref <18)

## 2023-02-27 LAB — CBC
HCT: 43 % (ref 36.0–46.0)
Hemoglobin: 13.7 g/dL (ref 12.0–15.0)
MCH: 29.6 pg (ref 26.0–34.0)
MCHC: 31.9 g/dL (ref 30.0–36.0)
MCV: 92.9 fL (ref 80.0–100.0)
Platelets: 228 10*3/uL (ref 150–400)
RBC: 4.63 MIL/uL (ref 3.87–5.11)
RDW: 14.7 % (ref 11.5–15.5)
WBC: 14.5 10*3/uL — ABNORMAL HIGH (ref 4.0–10.5)
nRBC: 0 % (ref 0.0–0.2)

## 2023-02-27 LAB — I-STAT CG4 LACTIC ACID, ED
Lactic Acid, Venous: 2.4 mmol/L (ref 0.5–1.9)
Lactic Acid, Venous: 2.2 mmol/L (ref 0.5–1.9)

## 2023-02-27 LAB — APTT: aPTT: 22 seconds — ABNORMAL LOW (ref 24–36)

## 2023-02-27 LAB — PROTIME-INR
INR: 1 (ref 0.8–1.2)
Prothrombin Time: 13.7 seconds (ref 11.4–15.2)

## 2023-02-27 LAB — RAPID URINE DRUG SCREEN, HOSP PERFORMED
Amphetamines: NOT DETECTED
Barbiturates: NOT DETECTED
Benzodiazepines: NOT DETECTED
Cocaine: NOT DETECTED
Opiates: NOT DETECTED
Tetrahydrocannabinol: NOT DETECTED

## 2023-02-27 LAB — RESP PANEL BY RT-PCR (RSV, FLU A&B, COVID)  RVPGX2
Influenza A by PCR: NEGATIVE
Influenza B by PCR: NEGATIVE
Resp Syncytial Virus by PCR: NEGATIVE
SARS Coronavirus 2 by RT PCR: NEGATIVE

## 2023-02-27 MED ORDER — SODIUM CHLORIDE 0.9 % IV BOLUS (SEPSIS)
500.0000 mL | Freq: Once | INTRAVENOUS | Status: AC
  Administered 2023-02-27: 500 mL via INTRAVENOUS

## 2023-02-27 MED ORDER — ACETAMINOPHEN 325 MG PO TABS
650.0000 mg | ORAL_TABLET | Freq: Once | ORAL | Status: AC
  Administered 2023-02-27: 650 mg via ORAL
  Filled 2023-02-27: qty 2

## 2023-02-27 MED ORDER — VANCOMYCIN HCL IN DEXTROSE 1-5 GM/200ML-% IV SOLN: 1000.0000 mg | Freq: Once | INTRAVENOUS | Status: DC

## 2023-02-27 MED ORDER — IOHEXOL 350 MG/ML SOLN
100.0000 mL | Freq: Once | INTRAVENOUS | Status: AC | PRN
  Administered 2023-02-27: 100 mL via INTRAVENOUS

## 2023-02-27 MED ORDER — SODIUM CHLORIDE 0.9 % IV SOLN: INTRAVENOUS | Status: AC

## 2023-02-27 MED ORDER — SODIUM CHLORIDE 0.9 % IV SOLN
2.0000 g | Freq: Once | INTRAVENOUS | Status: AC
  Administered 2023-02-27: 2 g via INTRAVENOUS
  Filled 2023-02-27: qty 12.5

## 2023-02-27 MED ORDER — SODIUM CHLORIDE 0.9 % IV BOLUS (SEPSIS)
1000.0000 mL | Freq: Once | INTRAVENOUS | Status: AC
  Administered 2023-02-27: 1000 mL via INTRAVENOUS

## 2023-02-27 MED ORDER — METRONIDAZOLE 500 MG/100ML IV SOLN
500.0000 mg | Freq: Once | INTRAVENOUS | Status: AC
  Administered 2023-02-27: 500 mg via INTRAVENOUS
  Filled 2023-02-27: qty 100

## 2023-02-27 MED ORDER — VANCOMYCIN HCL 1500 MG/300ML IV SOLN
1500.0000 mg | Freq: Once | INTRAVENOUS | Status: AC
  Administered 2023-02-27: 1500 mg via INTRAVENOUS
  Filled 2023-02-27: qty 300

## 2023-02-27 NOTE — ED Notes (Signed)
Dark green blood tube sent to lab, per RN.

## 2023-02-27 NOTE — Progress Notes (Signed)
Pharmacy Note   A consult was received from an ED physician for vancomycin and cefepime per pharmacy dosing.    The patient's profile has been reviewed for ht/wt/allergies/indication/available labs.    A one time order has been placed for cefepime 2 gr IV x1 and vancomycin 1500 mg IV x1 .    Further antibiotics/pharmacy consults should be ordered by admitting physician if indicated.                       Thank you,  Adalberto Cole, PharmD, BCPS 02/27/2023 8:11 PM

## 2023-02-27 NOTE — ED Triage Notes (Signed)
Pt facility called out due to thinking patient was having a stroke. Pt started leaning and had weakness in the left arm. Pt nor facility could give an exact time of when her symptoms started. They believe at least 3 hours ago.

## 2023-02-27 NOTE — Sepsis Progress Note (Signed)
Elink monitoring for the code sepsis protocol.   Notified provider of need to order 2nd lactic acid.

## 2023-02-27 NOTE — ED Notes (Signed)
Silverio Lay, MD notified of I Stat Lactic Acid value.

## 2023-02-27 NOTE — ED Provider Notes (Signed)
[place] EMERGENCY DEPARTMENT AT [facility] Provider Note   CSN: [id number] Arrival date & time: 02/27/23  1928     History  Chief Complaint  Patient presents with   Extremity Weakness    Laura Roberts is a 82 y.o. female  w/ hx of MS, paraplegia, HTN, HLD, hyperthyroidism that p/w weakness and tachycardia.   Reports feeling generally weak today. Reports that she has chronic L sided weakness 2/2 MS. She is chronically bed-bound, unable to move her LE. She stays at [facility]. Nurse report pt had vomited around 6-6:30. Report that she leaning more on her L side and thought she had a droop on her L face. Reports she was usually A&Ox4, but was A&Ox2. Physician on site noted she was tachycardic to 146. Her BP was 93/79. Her covid test was negative today. They called EMS, and she became more alert by the time they arrived but she was still not her normal self.   Pt is DNR/DNI. She is ok with pressors and medications.   Extremity Weakness      Home Medications Prior to Admission medications   Medication Sig Start Date End Date Taking? Authorizing Provider  acetaminophen (TYLENOL) 325 MG tablet Take 650 mg by mouth every 6 (six) hours as needed for mild pain or fever.    Yes [provider]  Amino Acids-Protein Hydrolys (FEEDING SUPPLEMENT, PRO-STAT SUGAR FREE 64,) LIQD Take 30 mLs by mouth 2 (two) times daily.   Yes [provider]  bacitracin 500 UNIT/GM ointment Apply 1 Application topically See admin instructions. Apply to suprapubic catheter site with dressing   Yes [provider]  baclofen (LIORESAL) 10 MG tablet Take 10-20 mg by mouth See admin instructions. Take 15 mg by mouth in the morning, 10 mg midday, and 20 mg at bedtime   Yes [provider]  baclofen (LIORESAL) 20 MG tablet Take 20 mg by mouth at bedtime.   Yes [provider]  barrier cream (NON-SPECIFIED) CREA Apply 1  application  topically See admin instructions. Apply to right ischium and bilateral buttocks 2 times a day   Yes [provider]  bisacodyl (DULCOLAX) 10 MG suppository Place 10 mg rectally daily as needed for moderate constipation (constipation not relieved by milk of magnesium).   Yes [provider]  calcium citrate (CALCITRATE - DOSED IN MG ELEMENTAL CALCIUM) 950 (200 Ca) MG tablet Take 200 mg of elemental calcium by mouth daily. (0800)   Yes [provider]  cholecalciferol (VITAMIN D) 25 MCG (1000 UNIT) tablet Take 1,000 Units by mouth daily. (1000)   Yes [provider]  Dimethyl Fumarate 240 MG CPDR One po bid Patient taking differently: Take 240 mg by mouth 2 (two) times daily. 09/12/22  Yes Sater, Pearletha Furl, MD  Dulaglutide (TRULICITY) 3 MG/0.5ML SOPN Inject 3 mg into the skin every Tuesday. (1000)   Yes [provider]  DULoxetine (CYMBALTA) 30 MG capsule Take 90 mg by mouth daily.   Yes [provider]  FEROSUL 325 (65 Fe) MG tablet Take 325 mg by mouth 2 (two) times daily with a meal.   Yes [provider]  furosemide (LASIX) 20 MG tablet Take 20 mg by mouth every other day.   Yes [provider]  gabapentin (NEURONTIN) 100 MG capsule Take 200 mg by mouth 3 (three) times daily.   Yes [provider]  magnesium hydroxide (MILK OF MAGNESIA) 400 MG/5ML  suspension Take 30 mLs by mouth daily as needed for mild constipation.   Yes [provider]  magnesium oxide (MAG-OX) 400 MG tablet Take 400 mg by mouth 2 (two) times daily. (1000 & 2200)   Yes [provider]  methimazole (TAPAZOLE) 5 MG tablet Take 5 mg by mouth daily at 6 (six) AM.   Yes [provider]  metoprolol succinate (TOPROL-XL) 50 MG 24 hr tablet Take 50 mg by mouth daily after breakfast. (0800)   Yes [provider]  Multiple Vitamin (MULTIVITAMIN WITH MINERALS) TABS tablet Take 1 tablet by mouth in the morning.  (1000)   Yes [provider]  NON FORMULARY Take 120 mLs by mouth See admin instructions. MedPass- Drink 120 ml's by mouth three times a day if Glucerna id not available   Yes [provider]  ondansetron (ZOFRAN) 4 MG tablet Take 4 mg by mouth every 6 (six) hours as needed for nausea.   Yes [provider]  pantoprazole (PROTONIX) 40 MG tablet Take 40 mg by mouth daily at 6 (six) AM. (0600)   Yes [provider]  polyethylene glycol (MIRALAX / GLYCOLAX) 17 g packet Take 17 g by mouth daily.   Yes [provider]  predniSONE (DELTASONE) 2.5 MG tablet Take 2.5 mg by mouth every other day. Alternating days with 5 mg dose   Yes [provider]  predniSONE (DELTASONE) 5 MG tablet Take 5 mg by mouth every other day. Alternating days with 2.5 mg dose   Yes [provider]  SF 5000 PLUS 1.1 % CREA dental cream Place 1 application  onto teeth daily at 8 pm. (2000) Pea size  Spit our excess and do not rinse water 12/13/20  Yes [provider]  Sodium Phosphates (RA SALINE ENEMA RE) Place 1 enema rectally daily as needed (severe constipation (not relieved by bisacodyl) AND CALL MD IF NO RELIEF FROM ENEMA).   Yes [provider]  solifenacin (VESICARE) 10 MG tablet Take 10 mg by mouth daily.   Yes [provider]  vitamin C (ASCORBIC ACID) 500 MG tablet Take 500 mg by mouth in the morning and at bedtime. (1000 & 2200)   Yes [provider]      Allergies    Codeine, Ultram [tramadol], and Januvia [sitagliptin]    Review of Systems   Review of Systems  Musculoskeletal:  Positive for extremity weakness.    Physical Exam Updated Vital Signs BP (!) 92/51   Pulse (!) 121   Temp 99.4 F (37.4 C) (Oral)   Resp 20   Ht 5\' 2"  (1.575 m)   Wt 79.8 kg   SpO2 99%   BMI 32.19 kg/m  Physical Exam Gen: Alert, chronically-ill apearing woman laying in bed.  HEENT: NCAT. MMM. PERRLA Neuro: A&Ox3. Unable to lift  L UE. 3/5 grip strength in L hand, 5/5 grip strength on R hand. CV: Rapid rate, regular rhythm.  Resp: CTAB, no crackles or wheezing. Normal WOB on RA Abm: Soft, nontender, nondistended. BS present. Colostomy bad in place.Has indwelling urinary cath. Skin: Stage 2 sacral ulcer  ED Results / Procedures / Treatments   Labs (all labs ordered are listed, but only abnormal results are displayed) Labs Reviewed  APTT - Abnormal; Notable for the following components:      Result Value   aPTT 22 (*)    All other components within normal limits  CBC - Abnormal; Notable for the following components:   WBC  14.5 (*)    All other components within normal limits  DIFFERENTIAL - Abnormal; Notable for the following components:   Neutro Abs 13.7 (*)    Lymphs Abs 0.3 (*)    Abs Immature Granulocytes 0.10 (*)    All other components within normal limits  COMPREHENSIVE METABOLIC PANEL - Abnormal; Notable for the following components:   Chloride 97 (*)    Glucose, Bld 198 (*)    BUN 26 (*)    AST 58 (*)    Total Bilirubin 1.6 (*)    Anion gap 17 (*)    All other components within normal limits  I-STAT CHEM 8, ED - Abnormal; Notable for the following components:   BUN 27 (*)    Glucose, Bld 208 (*)    All other components within normal limits  I-STAT CG4 LACTIC ACID, ED - Abnormal; Notable for the following components:   Lactic Acid, Venous 2.4 (*)    All other components within normal limits  I-STAT CG4 LACTIC ACID, ED - Abnormal; Notable for the following components:   Lactic Acid, Venous 2.2 (*)    All other components within normal limits  RESP PANEL BY RT-PCR (RSV, FLU A&B, COVID)  [id number]  CULTURE, BLOOD (ROUTINE X 2)  CULTURE, BLOOD (ROUTINE X 2)  URINE CULTURE  ETHANOL  PROTIME-INR  RAPID URINE DRUG SCREEN, HOSP PERFORMED  URINALYSIS, ROUTINE W REFLEX MICROSCOPIC  TROPONIN I (HIGH SENSITIVITY)  TROPONIN I (HIGH SENSITIVITY)    EKG EKG Interpretation Date/Time:  Thursday February 27 2023 19:51:57 EDT Ventricular Rate:  152 PR Interval:  111 QRS Duration:  80 QT Interval:  271 QTC Calculation: 431 R Axis:   128  Text Interpretation: Supraventricular tachycardia Left posterior fascicular block Anterior infarct, old Nonspecific T abnormalities, inferior leads Since last tracing rate faster Confirmed by Richardean Canal [phone]) on 02/27/2023 8:13:16 PM  Radiology CT ABDOMEN PELVIS W CONTRAST  Result Date: 02/27/2023 CLINICAL DATA:  Left arm weakness, diverticulitis, sepsis, shortness of breath EXAM: CT ANGIOGRAPHY CHEST CT ABDOMEN AND PELVIS WITH CONTRAST TECHNIQUE: Multidetector CT imaging of the chest was performed using the standard protocol during bolus administration of intravenous contrast. Multiplanar CT image reconstructions and MIPs were obtained to evaluate the vascular anatomy. Multidetector CT imaging of the abdomen and pelvis was performed using the standard protocol during bolus administration of intravenous contrast. RADIATION DOSE REDUCTION: This exam was performed according to the departmental dose-optimization program which includes automated exposure control, adjustment of the mA and/or kV according to patient size and/or use of iterative reconstruction technique. CONTRAST:  OMNIPAQUE IOHEXOL 350 MG/ML SOLN COMPARISON:  02/27/2023, 05/02/2022 FINDINGS: CTA CHEST FINDINGS Cardiovascular: This is a technically adequate evaluation of the pulmonary vasculature. No filling defects or pulmonary emboli. Mild cardiomegaly without pericardial effusion. No evidence of thoracic aortic aneurysm or dissection. Atherosclerosis of the aorta and coronary vasculature. Mediastinum/Nodes: Heterogeneous asymmetric enlargement of the left lobe thyroid. Segmental areas soft g is noted, nonspecific. No esophageal wall thickening. Trachea is unremarkable. No pathologic adenopathy. Lungs/Pleura: No acute airspace disease, effusion, or pneumothorax. Central airways are patent.  Musculoskeletal: No acute or destructive bony abnormalities. Severe degenerative changes of the bilateral shoulders. Progressive severe spondylosis at the thoracolumbar junction, with degenerative anterolisthesis of T11 relative T12. Prior T12 laminectomy and T11 laminotomy. Reconstructed images demonstrate no additional findings. Review of the MIP images confirms the above findings. CT ABDOMEN and PELVIS FINDINGS Hepatobiliary: Coarse benign appearing calcifications are seen within the ventral aspect right lobe  liver. No other focal liver abnormalities. Gallbladder is unremarkable, with expected postsurgical dilatation of the common bile duct. Pancreas: Unremarkable. No pancreatic ductal dilatation or surrounding inflammatory changes. Spleen: Normal in size without focal abnormality. Adrenals/Urinary Tract: Kidneys are unremarkable, with no urinary tract calculi or obstructive uropathy. The adrenals are stable. Bladder is decompressed with a suprapubic catheter, with chronic nonspecific bladder wall thickening. Stomach/Bowel: Loop colostomy within the left mid abdomen. No bowel obstruction or ileus. Colonic diverticulosis without evidence of acute diverticulitis. No bowel wall thickening or inflammatory change. Vascular/Lymphatic: Aortic atherosclerosis. No enlarged abdominal or pelvic lymph nodes. Reproductive: Status post hysterectomy. No adnexal masses. Other: No free fluid or free intraperitoneal gas. Musculoskeletal: Decubitus ulcers are seen within the mid sacral region and overlying the left ischium. There is evidence of chronic osteomyelitis of the sacrum. I do not see any bony destruction at the left ischium. Chronic posttraumatic and postsurgical changes of the bilateral hips. No acute displaced fracture. Reconstructed images demonstrate no additional findings. Review of the MIP images confirms the above findings. IMPRESSION: Chest: 1. No evidence of pulmonary embolus. 2. Cardiomegaly and coronary artery  atherosclerosis. 3.  Aortic Atherosclerosis (ICD10-I70.0). Abdomen/pelvis: 1. No acute intra-abdominal or intrapelvic process. 2. Colonic diverticulosis without diverticulitis. 3. Loop colostomy left mid abdomen.  No bowel obstruction or ileus. 4. Decubitus ulcers overlying the sacrum and left ischium. Evidence of chronic osteomyelitis of the distal sacrum unchanged. 5. Chronic indwelling suprapubic catheter, with nonspecific bladder wall thickening. 6.  Aortic Atherosclerosis (ICD10-I70.0). Electronically Signed   By: Sharlet Salina M.D.   On: 02/27/2023 22:55   CT Angio Chest PE W and/or Wo Contrast  Result Date: 02/27/2023 CLINICAL DATA:  Left arm weakness, diverticulitis, sepsis, shortness of breath EXAM: CT ANGIOGRAPHY CHEST CT ABDOMEN AND PELVIS WITH CONTRAST TECHNIQUE: Multidetector CT imaging of the chest was performed using the standard protocol during bolus administration of intravenous contrast. Multiplanar CT image reconstructions and MIPs were obtained to evaluate the vascular anatomy. Multidetector CT imaging of the abdomen and pelvis was performed using the standard protocol during bolus administration of intravenous contrast. RADIATION DOSE REDUCTION: This exam was performed according to the departmental dose-optimization program which includes automated exposure control, adjustment of the mA and/or kV according to patient size and/or use of iterative reconstruction technique. CONTRAST:  OMNIPAQUE IOHEXOL 350 MG/ML SOLN COMPARISON:  02/27/2023, 05/02/2022 FINDINGS: CTA CHEST FINDINGS Cardiovascular: This is a technically adequate evaluation of the pulmonary vasculature. No filling defects or pulmonary emboli. Mild cardiomegaly without pericardial effusion. No evidence of thoracic aortic aneurysm or dissection. Atherosclerosis of the aorta and coronary vasculature. Mediastinum/Nodes: Heterogeneous asymmetric enlargement of the left lobe thyroid. Segmental areas soft g is noted, nonspecific.  No esophageal wall thickening. Trachea is unremarkable. No pathologic adenopathy. Lungs/Pleura: No acute airspace disease, effusion, or pneumothorax. Central airways are patent. Musculoskeletal: No acute or destructive bony abnormalities. Severe degenerative changes of the bilateral shoulders. Progressive severe spondylosis at the thoracolumbar junction, with degenerative anterolisthesis of T11 relative T12. Prior T12 laminectomy and T11 laminotomy. Reconstructed images demonstrate no additional findings. Review of the MIP images confirms the above findings. CT ABDOMEN and PELVIS FINDINGS Hepatobiliary: Coarse benign appearing calcifications are seen within the ventral aspect right lobe liver. No other focal liver abnormalities. Gallbladder is unremarkable, with expected postsurgical dilatation of the common bile duct. Pancreas: Unremarkable. No pancreatic ductal dilatation or surrounding inflammatory changes. Spleen: Normal in size without focal abnormality. Adrenals/Urinary Tract: Kidneys are unremarkable, with no urinary tract calculi or obstructive uropathy.  The adrenals are stable. Bladder is decompressed with a suprapubic catheter, with chronic nonspecific bladder wall thickening. Stomach/Bowel: Loop colostomy within the left mid abdomen. No bowel obstruction or ileus. Colonic diverticulosis without evidence of acute diverticulitis. No bowel wall thickening or inflammatory change. Vascular/Lymphatic: Aortic atherosclerosis. No enlarged abdominal or pelvic lymph nodes. Reproductive: Status post hysterectomy. No adnexal masses. Other: No free fluid or free intraperitoneal gas. Musculoskeletal: Decubitus ulcers are seen within the mid sacral region and overlying the left ischium. There is evidence of chronic osteomyelitis of the sacrum. I do not see any bony destruction at the left ischium. Chronic posttraumatic and postsurgical changes of the bilateral hips. No acute displaced fracture. Reconstructed images  demonstrate no additional findings. Review of the MIP images confirms the above findings. IMPRESSION: Chest: 1. No evidence of pulmonary embolus. 2. Cardiomegaly and coronary artery atherosclerosis. 3.  Aortic Atherosclerosis (ICD10-I70.0). Abdomen/pelvis: 1. No acute intra-abdominal or intrapelvic process. 2. Colonic diverticulosis without diverticulitis. 3. Loop colostomy left mid abdomen.  No bowel obstruction or ileus. 4. Decubitus ulcers overlying the sacrum and left ischium. Evidence of chronic osteomyelitis of the distal sacrum unchanged. 5. Chronic indwelling suprapubic catheter, with nonspecific bladder wall thickening. 6.  Aortic Atherosclerosis (ICD10-I70.0). Electronically Signed   By: Sharlet Salina M.D.   On: 02/27/2023 22:55   CT HEAD WO CONTRAST  Result Date: 02/27/2023 CLINICAL DATA:  Neuro deficit, acute, stroke suspected EXAM: CT HEAD WITHOUT CONTRAST TECHNIQUE: Contiguous axial images were obtained from the base of the skull through the vertex without intravenous contrast. RADIATION DOSE REDUCTION: This exam was performed according to the departmental dose-optimization program which includes automated exposure control, adjustment of the mA and/or kV according to patient size and/or use of iterative reconstruction technique. COMPARISON:  Brain MR 03/09/21 FINDINGS: Brain: No evidence of acute infarction, hemorrhage, hydrocephalus, extra-axial collection or mass lesion/mass effect. Extensive periventricular hypodensity likely represents sequela multiple sclerosis. Vascular: No hyperdense vessel or unexpected calcification. Skull: Normal. Negative for fracture or focal lesion. Sinuses/Orbits: No middle ear or mastoid effusion. Paranasal sinuses are notable for complete opacification of the right sphenoid sinus with osseous changes suggestive of chronic right sphenoid sinusitis. Other: None. IMPRESSION: No acute intracranial abnormality Electronically Signed   By: Lorenza Cambridge M.D.   On:  02/27/2023 20:43   DG Chest Port 1 View  Result Date: 02/27/2023 CLINICAL DATA:  Sepsis. Left arm weakness and leaning. Shortness of breath. EXAM: PORTABLE CHEST 1 VIEW COMPARISON:  04/05/2021 FINDINGS: Patient rotation limits examination. Shallow inspiration. Heart size and pulmonary vascularity are probably normal for technique. No pleural effusions. No pneumothorax. Mediastinal contours appear intact. IMPRESSION: Shallow inspiration.  No evidence of active pulmonary disease. Electronically Signed   By: Burman Nieves M.D.   On: 02/27/2023 20:22    Procedures Procedures   Medications Ordered in ED Medications  0.9 %  sodium chloride infusion ( Intravenous New Bag/Given 02/27/23 2150)  vancomycin (VANCOREADY) IVPB 1500 mg/300 mL (1,500 mg Intravenous New Bag/Given 02/27/23 2126)  sodium chloride 0.9 % bolus 1,000 mL (0 mLs Intravenous Stopped 02/27/23 2153)    And  sodium chloride 0.9 % bolus 1,000 mL (0 mLs Intravenous Stopped 02/27/23 2153)    And  sodium chloride 0.9 % bolus 500 mL (0 mLs Intravenous Stopped 02/27/23 2153)  ceFEPIme (MAXIPIME) 2 g in sodium chloride 0.9 % 100 mL IVPB (0 g Intravenous Stopped 02/27/23 2047)  metroNIDAZOLE (FLAGYL) IVPB 500 mg (0 mg Intravenous Stopped 02/27/23 2153)  acetaminophen (TYLENOL) tablet 650 mg (650  mg Oral Given 02/27/23 2035)  iohexol (OMNIPAQUE) 350 MG/ML injection 100 mL (100 mLs Intravenous Contrast Given 02/27/23 2214)    ED Course/ Medical Decision Making/ A&P                               Medical Decision Making:   Laura Roberts is a 82 y.o. female who presented to the ED today with weakness and tachycardia detailed above.    Additional history discussed with patient's family/caregivers.   Complete initial physical exam performed, notably the patient was febrile to 102.1 and tachy to 150.    Reviewed and confirmed nursing documentation for past medical history, family history, social history.    Initial Assessment:   With the  patient's presentation of weakness, fever, and tachycardia, presentation is c/f sepsis. Unclear source of fever, etiologies include UTI, PNA, viral URI, intraabdominal infection (diverticulitis, appendicitis). Stroke is considered but less likely as pt's current neuro deficits are chronic 2/2 multiple sclerosis and stroke would not explain tachycardia and fever.   This is most consistent with an acute complicated illness  Initial Plan:  Code Sepsis activated CT head to workup CVA CTAP for intraabdominal infxn PT/INR, ethanol level, UDS,  Cefepime, Vanc, flagyl for antibx Tylenol for fever Bcx x2, Ucx, RPP Lactic acid Screening labs including CBC and Metabolic panel to evaluate for infectious or metabolic etiology of disease.  Urinalysis with reflex culture ordered to evaluate for UTI or relevant urologic/nephrologic pathology.  CXR to evaluate for structural/infectious intrathoracic pathology.  EKG and trop to evaluate for cardiac pathology Objective evaluation as below reviewed   Initial Study Results:   Laboratory  - L acid 2.4 - Leukocytosis w/ neurtophilia  EKG EKG was reviewed independently. Rate, rhythm, axis, intervals all examined and without medically relevant abnormality. ST segments without concerns for elevations.    Radiology:  All images reviewed independently. Agree with radiology report at this time.   CT ABDOMEN PELVIS W CONTRAST  Result Date: 02/27/2023 CLINICAL DATA:  Left arm weakness, diverticulitis, sepsis, shortness of breath EXAM: CT ANGIOGRAPHY CHEST CT ABDOMEN AND PELVIS WITH CONTRAST TECHNIQUE: Multidetector CT imaging of the chest was performed using the standard protocol during bolus administration of intravenous contrast. Multiplanar CT image reconstructions and MIPs were obtained to evaluate the vascular anatomy. Multidetector CT imaging of the abdomen and pelvis was performed using the standard protocol during bolus administration of intravenous  contrast. RADIATION DOSE REDUCTION: This exam was performed according to the departmental dose-optimization program which includes automated exposure control, adjustment of the mA and/or kV according to patient size and/or use of iterative reconstruction technique. CONTRAST:  OMNIPAQUE IOHEXOL 350 MG/ML SOLN COMPARISON:  02/27/2023, 05/02/2022 FINDINGS: CTA CHEST FINDINGS Cardiovascular: This is a technically adequate evaluation of the pulmonary vasculature. No filling defects or pulmonary emboli. Mild cardiomegaly without pericardial effusion. No evidence of thoracic aortic aneurysm or dissection. Atherosclerosis of the aorta and coronary vasculature. Mediastinum/Nodes: Heterogeneous asymmetric enlargement of the left lobe thyroid. Segmental areas soft g is noted, nonspecific. No esophageal wall thickening. Trachea is unremarkable. No pathologic adenopathy. Lungs/Pleura: No acute airspace disease, effusion, or pneumothorax. Central airways are patent. Musculoskeletal: No acute or destructive bony abnormalities. Severe degenerative changes of the bilateral shoulders. Progressive severe spondylosis at the thoracolumbar junction, with degenerative anterolisthesis of T11 relative T12. Prior T12 laminectomy and T11 laminotomy. Reconstructed images demonstrate no additional findings. Review of the MIP images confirms the above findings. CT  ABDOMEN and PELVIS FINDINGS Hepatobiliary: Coarse benign appearing calcifications are seen within the ventral aspect right lobe liver. No other focal liver abnormalities. Gallbladder is unremarkable, with expected postsurgical dilatation of the common bile duct. Pancreas: Unremarkable. No pancreatic ductal dilatation or surrounding inflammatory changes. Spleen: Normal in size without focal abnormality. Adrenals/Urinary Tract: Kidneys are unremarkable, with no urinary tract calculi or obstructive uropathy. The adrenals are stable. Bladder is decompressed with a suprapubic  catheter, with chronic nonspecific bladder wall thickening. Stomach/Bowel: Loop colostomy within the left mid abdomen. No bowel obstruction or ileus. Colonic diverticulosis without evidence of acute diverticulitis. No bowel wall thickening or inflammatory change. Vascular/Lymphatic: Aortic atherosclerosis. No enlarged abdominal or pelvic lymph nodes. Reproductive: Status post hysterectomy. No adnexal masses. Other: No free fluid or free intraperitoneal gas. Musculoskeletal: Decubitus ulcers are seen within the mid sacral region and overlying the left ischium. There is evidence of chronic osteomyelitis of the sacrum. I do not see any bony destruction at the left ischium. Chronic posttraumatic and postsurgical changes of the bilateral hips. No acute displaced fracture. Reconstructed images demonstrate no additional findings. Review of the MIP images confirms the above findings. IMPRESSION: Chest: 1. No evidence of pulmonary embolus. 2. Cardiomegaly and coronary artery atherosclerosis. 3.  Aortic Atherosclerosis (ICD10-I70.0). Abdomen/pelvis: 1. No acute intra-abdominal or intrapelvic process. 2. Colonic diverticulosis without diverticulitis. 3. Loop colostomy left mid abdomen.  No bowel obstruction or ileus. 4. Decubitus ulcers overlying the sacrum and left ischium. Evidence of chronic osteomyelitis of the distal sacrum unchanged. 5. Chronic indwelling suprapubic catheter, with nonspecific bladder wall thickening. 6.  Aortic Atherosclerosis (ICD10-I70.0). Electronically Signed   By: Sharlet Salina M.D.   On: 02/27/2023 22:55   CT Angio Chest PE W and/or Wo Contrast  Result Date: 02/27/2023 CLINICAL DATA:  Left arm weakness, diverticulitis, sepsis, shortness of breath EXAM: CT ANGIOGRAPHY CHEST CT ABDOMEN AND PELVIS WITH CONTRAST TECHNIQUE: Multidetector CT imaging of the chest was performed using the standard protocol during bolus administration of intravenous contrast. Multiplanar CT image reconstructions and  MIPs were obtained to evaluate the vascular anatomy. Multidetector CT imaging of the abdomen and pelvis was performed using the standard protocol during bolus administration of intravenous contrast. RADIATION DOSE REDUCTION: This exam was performed according to the departmental dose-optimization program which includes automated exposure control, adjustment of the mA and/or kV according to patient size and/or use of iterative reconstruction technique. CONTRAST:  OMNIPAQUE IOHEXOL 350 MG/ML SOLN COMPARISON:  02/27/2023, 05/02/2022 FINDINGS: CTA CHEST FINDINGS Cardiovascular: This is a technically adequate evaluation of the pulmonary vasculature. No filling defects or pulmonary emboli. Mild cardiomegaly without pericardial effusion. No evidence of thoracic aortic aneurysm or dissection. Atherosclerosis of the aorta and coronary vasculature. Mediastinum/Nodes: Heterogeneous asymmetric enlargement of the left lobe thyroid. Segmental areas soft g is noted, nonspecific. No esophageal wall thickening. Trachea is unremarkable. No pathologic adenopathy. Lungs/Pleura: No acute airspace disease, effusion, or pneumothorax. Central airways are patent. Musculoskeletal: No acute or destructive bony abnormalities. Severe degenerative changes of the bilateral shoulders. Progressive severe spondylosis at the thoracolumbar junction, with degenerative anterolisthesis of T11 relative T12. Prior T12 laminectomy and T11 laminotomy. Reconstructed images demonstrate no additional findings. Review of the MIP images confirms the above findings. CT ABDOMEN and PELVIS FINDINGS Hepatobiliary: Coarse benign appearing calcifications are seen within the ventral aspect right lobe liver. No other focal liver abnormalities. Gallbladder is unremarkable, with expected postsurgical dilatation of the common bile duct. Pancreas: Unremarkable. No pancreatic ductal dilatation or surrounding inflammatory changes. Spleen: Normal in  size without focal  abnormality. Adrenals/Urinary Tract: Kidneys are unremarkable, with no urinary tract calculi or obstructive uropathy. The adrenals are stable. Bladder is decompressed with a suprapubic catheter, with chronic nonspecific bladder wall thickening. Stomach/Bowel: Loop colostomy within the left mid abdomen. No bowel obstruction or ileus. Colonic diverticulosis without evidence of acute diverticulitis. No bowel wall thickening or inflammatory change. Vascular/Lymphatic: Aortic atherosclerosis. No enlarged abdominal or pelvic lymph nodes. Reproductive: Status post hysterectomy. No adnexal masses. Other: No free fluid or free intraperitoneal gas. Musculoskeletal: Decubitus ulcers are seen within the mid sacral region and overlying the left ischium. There is evidence of chronic osteomyelitis of the sacrum. I do not see any bony destruction at the left ischium. Chronic posttraumatic and postsurgical changes of the bilateral hips. No acute displaced fracture. Reconstructed images demonstrate no additional findings. Review of the MIP images confirms the above findings. IMPRESSION: Chest: 1. No evidence of pulmonary embolus. 2. Cardiomegaly and coronary artery atherosclerosis. 3.  Aortic Atherosclerosis (ICD10-I70.0). Abdomen/pelvis: 1. No acute intra-abdominal or intrapelvic process. 2. Colonic diverticulosis without diverticulitis. 3. Loop colostomy left mid abdomen.  No bowel obstruction or ileus. 4. Decubitus ulcers overlying the sacrum and left ischium. Evidence of chronic osteomyelitis of the distal sacrum unchanged. 5. Chronic indwelling suprapubic catheter, with nonspecific bladder wall thickening. 6.  Aortic Atherosclerosis (ICD10-I70.0). Electronically Signed   By: Sharlet Salina M.D.   On: 02/27/2023 22:55   CT HEAD WO CONTRAST  Result Date: 02/27/2023 CLINICAL DATA:  Neuro deficit, acute, stroke suspected EXAM: CT HEAD WITHOUT CONTRAST TECHNIQUE: Contiguous axial images were obtained from the base of the skull  through the vertex without intravenous contrast. RADIATION DOSE REDUCTION: This exam was performed according to the departmental dose-optimization program which includes automated exposure control, adjustment of the mA and/or kV according to patient size and/or use of iterative reconstruction technique. COMPARISON:  Brain MR 03/09/21 FINDINGS: Brain: No evidence of acute infarction, hemorrhage, hydrocephalus, extra-axial collection or mass lesion/mass effect. Extensive periventricular hypodensity likely represents sequela multiple sclerosis. Vascular: No hyperdense vessel or unexpected calcification. Skull: Normal. Negative for fracture or focal lesion. Sinuses/Orbits: No middle ear or mastoid effusion. Paranasal sinuses are notable for complete opacification of the right sphenoid sinus with osseous changes suggestive of chronic right sphenoid sinusitis. Other: None. IMPRESSION: No acute intracranial abnormality Electronically Signed   By: Lorenza Cambridge M.D.   On: 02/27/2023 20:43   DG Chest Port 1 View  Result Date: 02/27/2023 CLINICAL DATA:  Sepsis. Left arm weakness and leaning. Shortness of breath. EXAM: PORTABLE CHEST 1 VIEW COMPARISON:  04/05/2021 FINDINGS: Patient rotation limits examination. Shallow inspiration. Heart size and pulmonary vascularity are probably normal for technique. No pleural effusions. No pneumothorax. Mediastinal contours appear intact. IMPRESSION: Shallow inspiration.  No evidence of active pulmonary disease. Electronically Signed   By: Burman Nieves M.D.   On: 02/27/2023 20:22     Reassessment and Plan:   - 2100: desatted to 82% on RA. Was placed on 2L with improvement to 97%. CXR w.o evidence of PNA. Will check CTA PE.  - Lactic acid 2.4>2.2 s/p fluids - CTAP showed osteomyelitis. This could be source of infection. Urine studies still pending.  - Recommended admission to hospital for further management. Pt and family are agreeable.  - Discussed case w/ hospitalist and they  agree to see pt.    Final Clinical Impression(s) / ED Diagnoses Final diagnoses:  Sepsis, due to unspecified organism, unspecified whether acute organ dysfunction present (HCC)  Pressure injury of sacral  region, stage 2 [facility])  Other osteomyelitis, unspecified site [facility])    Rx / DC Orders ED Discharge Orders     None         Lincoln Brigham, MD 02/27/23 [id number]    Charlynne Pander, MD 02/27/23 [phone]

## 2023-02-27 NOTE — ED Notes (Signed)
Colostomy bag soiled on arrival, patient cleaned and new bag placed

## 2023-02-27 NOTE — ED Notes (Signed)
Istat shown to Silverio Lay, MD at this time.

## 2023-02-28 ENCOUNTER — Encounter (HOSPITAL_COMMUNITY): Payer: Self-pay | Admitting: Internal Medicine

## 2023-02-28 DIAGNOSIS — R6511 Systemic inflammatory response syndrome (SIRS) of non-infectious origin with acute organ dysfunction: Secondary | ICD-10-CM

## 2023-02-28 DIAGNOSIS — G8220 Paraplegia, unspecified: Secondary | ICD-10-CM

## 2023-02-28 DIAGNOSIS — Z9359 Other cystostomy status: Secondary | ICD-10-CM | POA: Diagnosis not present

## 2023-02-28 DIAGNOSIS — I10 Essential (primary) hypertension: Secondary | ICD-10-CM

## 2023-02-28 DIAGNOSIS — L89154 Pressure ulcer of sacral region, stage 4: Secondary | ICD-10-CM | POA: Diagnosis present

## 2023-02-28 DIAGNOSIS — Z933 Colostomy status: Secondary | ICD-10-CM

## 2023-02-28 DIAGNOSIS — J449 Chronic obstructive pulmonary disease, unspecified: Secondary | ICD-10-CM | POA: Diagnosis present

## 2023-02-28 DIAGNOSIS — R6510 Systemic inflammatory response syndrome (SIRS) of non-infectious origin without acute organ dysfunction: Secondary | ICD-10-CM | POA: Diagnosis present

## 2023-02-28 DIAGNOSIS — Z833 Family history of diabetes mellitus: Secondary | ICD-10-CM | POA: Diagnosis not present

## 2023-02-28 DIAGNOSIS — E785 Hyperlipidemia, unspecified: Secondary | ICD-10-CM | POA: Diagnosis present

## 2023-02-28 DIAGNOSIS — E1169 Type 2 diabetes mellitus with other specified complication: Secondary | ICD-10-CM | POA: Diagnosis present

## 2023-02-28 DIAGNOSIS — M810 Age-related osteoporosis without current pathological fracture: Secondary | ICD-10-CM | POA: Diagnosis present

## 2023-02-28 DIAGNOSIS — E1140 Type 2 diabetes mellitus with diabetic neuropathy, unspecified: Secondary | ICD-10-CM

## 2023-02-28 DIAGNOSIS — N319 Neuromuscular dysfunction of bladder, unspecified: Secondary | ICD-10-CM | POA: Diagnosis present

## 2023-02-28 DIAGNOSIS — R4701 Aphasia: Secondary | ICD-10-CM | POA: Diagnosis present

## 2023-02-28 DIAGNOSIS — Z7952 Long term (current) use of systemic steroids: Secondary | ICD-10-CM | POA: Diagnosis not present

## 2023-02-28 DIAGNOSIS — Z66 Do not resuscitate: Secondary | ICD-10-CM | POA: Diagnosis present

## 2023-02-28 DIAGNOSIS — Z7401 Bed confinement status: Secondary | ICD-10-CM | POA: Diagnosis not present

## 2023-02-28 DIAGNOSIS — G35 Multiple sclerosis: Secondary | ICD-10-CM

## 2023-02-28 DIAGNOSIS — M868X9 Other osteomyelitis, unspecified sites: Secondary | ICD-10-CM | POA: Diagnosis present

## 2023-02-28 DIAGNOSIS — Z820 Family history of epilepsy and other diseases of the nervous system: Secondary | ICD-10-CM | POA: Diagnosis not present

## 2023-02-28 DIAGNOSIS — M8660 Other chronic osteomyelitis, unspecified site: Secondary | ICD-10-CM

## 2023-02-28 DIAGNOSIS — Z794 Long term (current) use of insulin: Secondary | ICD-10-CM

## 2023-02-28 DIAGNOSIS — M4628 Osteomyelitis of vertebra, sacral and sacrococcygeal region: Secondary | ICD-10-CM | POA: Diagnosis present

## 2023-02-28 DIAGNOSIS — I445 Left posterior fascicular block: Secondary | ICD-10-CM | POA: Diagnosis present

## 2023-02-28 DIAGNOSIS — Z1152 Encounter for screening for COVID-19: Secondary | ICD-10-CM | POA: Diagnosis not present

## 2023-02-28 DIAGNOSIS — Z96653 Presence of artificial knee joint, bilateral: Secondary | ICD-10-CM | POA: Diagnosis present

## 2023-02-28 DIAGNOSIS — Z79899 Other long term (current) drug therapy: Secondary | ICD-10-CM | POA: Diagnosis not present

## 2023-02-28 LAB — RESPIRATORY PANEL BY PCR

## 2023-02-28 LAB — CBC WITH DIFFERENTIAL/PLATELET
Abs Immature Granulocytes: 0.1 10*3/uL — ABNORMAL HIGH (ref 0.00–0.07)
Basophils Absolute: 0.1 10*3/uL (ref 0.0–0.1)
Basophils Relative: 0 %
Eosinophils Absolute: 0.1 10*3/uL (ref 0.0–0.5)
Eosinophils Relative: 1 %
HCT: 36.1 % (ref 36.0–46.0)
Hemoglobin: 11.3 g/dL — ABNORMAL LOW (ref 12.0–15.0)
Immature Granulocytes: 1 %
Lymphocytes Relative: 8 %
Lymphs Abs: 1.6 10*3/uL (ref 0.7–4.0)
MCH: 29.4 pg (ref 26.0–34.0)
MCHC: 31.3 g/dL (ref 30.0–36.0)
MCV: 93.8 fL (ref 80.0–100.0)
Monocytes Absolute: 1.6 10*3/uL — ABNORMAL HIGH (ref 0.1–1.0)
Monocytes Relative: 8 %
Neutro Abs: 17 10*3/uL — ABNORMAL HIGH (ref 1.7–7.7)
Neutrophils Relative %: 82 %
Platelets: 214 10*3/uL (ref 150–400)
RBC: 3.85 MIL/uL — ABNORMAL LOW (ref 3.87–5.11)
RDW: 15.1 % (ref 11.5–15.5)
WBC: 20.4 10*3/uL — ABNORMAL HIGH (ref 4.0–10.5)
nRBC: 0 % (ref 0.0–0.2)

## 2023-02-28 LAB — COMPREHENSIVE METABOLIC PANEL
ALT: 32 U/L (ref 0–44)
AST: 33 U/L (ref 15–41)
Albumin: 2.8 g/dL — ABNORMAL LOW (ref 3.5–5.0)
Alkaline Phosphatase: 63 U/L (ref 38–126)
Anion gap: 9 (ref 5–15)
BUN: 17 mg/dL (ref 8–23)
CO2: 23 mmol/L (ref 22–32)
Calcium: 8.1 mg/dL — ABNORMAL LOW (ref 8.9–10.3)
Chloride: 105 mmol/L (ref 98–111)
Creatinine, Ser: 0.49 mg/dL (ref 0.44–1.00)
GFR, Estimated: >60 mL/min (ref >60)
Glucose, Bld: 112 mg/dL — ABNORMAL HIGH (ref 70–99)
Potassium: 4 mmol/L (ref 3.5–5.1)
Sodium: 137 mmol/L (ref 135–145)
Total Bilirubin: 1.2 mg/dL (ref 0.3–1.2)
Total Protein: 6 g/dL — ABNORMAL LOW (ref 6.5–8.1)

## 2023-02-28 LAB — GLUCOSE, CAPILLARY
Glucose-Capillary: 154 mg/dL — ABNORMAL HIGH (ref 70–99)
Glucose-Capillary: 196 mg/dL — ABNORMAL HIGH (ref 70–99)

## 2023-02-28 LAB — PROCALCITONIN
Procalcitonin: 15.97 ng/mL
Procalcitonin: 40.88 ng/mL

## 2023-02-28 LAB — CBG MONITORING, ED
Glucose-Capillary: 105 mg/dL — ABNORMAL HIGH (ref 70–99)
Glucose-Capillary: 119 mg/dL — ABNORMAL HIGH (ref 70–99)

## 2023-02-28 LAB — HEMOGLOBIN A1C
Hgb A1c MFr Bld: 6.4 % — ABNORMAL HIGH (ref 4.8–5.6)
Mean Plasma Glucose: 136.98 mg/dL

## 2023-02-28 MED ORDER — SODIUM CHLORIDE 0.9 % IV SOLN
2.0000 g | Freq: Two times a day (BID) | INTRAVENOUS | Status: DC
  Administered 2023-02-28 – 2023-03-05 (×11): 2 g via INTRAVENOUS
  Filled 2023-02-28 (×11): qty 12.5

## 2023-02-28 MED ORDER — ONDANSETRON HCL 4 MG/2ML IJ SOLN: 4.0000 mg | Freq: Four times a day (QID) | INTRAMUSCULAR | Status: DC | PRN

## 2023-02-28 MED ORDER — VANCOMYCIN HCL 750 MG/150ML IV SOLN
750.0000 mg | INTRAVENOUS | Status: DC
  Administered 2023-02-28 – 2023-03-04 (×5): 750 mg via INTRAVENOUS
  Filled 2023-02-28 (×5): qty 150

## 2023-02-28 MED ORDER — ACETAMINOPHEN 650 MG RE SUPP: 650.0000 mg | Freq: Four times a day (QID) | RECTAL | Status: DC | PRN

## 2023-02-28 MED ORDER — BACLOFEN 10 MG PO TABS
10.0000 mg | ORAL_TABLET | Freq: Every day | ORAL | Status: DC
  Administered 2023-02-28 – 2023-03-04 (×5): 10 mg via ORAL
  Filled 2023-02-28 (×5): qty 1

## 2023-02-28 MED ORDER — BACLOFEN 10 MG PO TABS
15.0000 mg | ORAL_TABLET | Freq: Every day | ORAL | Status: DC
  Administered 2023-02-28 – 2023-03-05 (×6): 15 mg via ORAL
  Filled 2023-02-28 (×6): qty 2

## 2023-02-28 MED ORDER — GABAPENTIN 100 MG PO CAPS
200.0000 mg | ORAL_CAPSULE | Freq: Three times a day (TID) | ORAL | Status: DC
  Administered 2023-02-28 – 2023-03-05 (×16): 200 mg via ORAL
  Filled 2023-02-28 (×16): qty 2

## 2023-02-28 MED ORDER — METHIMAZOLE 5 MG PO TABS
5.0000 mg | ORAL_TABLET | Freq: Every day | ORAL | Status: DC
  Administered 2023-02-28 – 2023-03-05 (×6): 5 mg via ORAL
  Filled 2023-02-28 (×7): qty 1

## 2023-02-28 MED ORDER — ONDANSETRON HCL 4 MG PO TABS: 4.0000 mg | ORAL_TABLET | Freq: Four times a day (QID) | ORAL | Status: DC | PRN

## 2023-02-28 MED ORDER — FESOTERODINE FUMARATE ER 4 MG PO TB24
4.0000 mg | ORAL_TABLET | Freq: Every day | ORAL | Status: DC
  Administered 2023-02-28 – 2023-03-05 (×6): 4 mg via ORAL
  Filled 2023-02-28 (×7): qty 1

## 2023-02-28 MED ORDER — ACETAMINOPHEN 325 MG PO TABS
650.0000 mg | ORAL_TABLET | Freq: Four times a day (QID) | ORAL | Status: DC | PRN
  Administered 2023-03-01: 650 mg via ORAL
  Filled 2023-02-28: qty 2

## 2023-02-28 MED ORDER — MELATONIN 5 MG PO TABS
10.0000 mg | ORAL_TABLET | Freq: Every evening | ORAL | Status: DC | PRN
  Administered 2023-03-01 – 2023-03-04 (×2): 10 mg via ORAL
  Filled 2023-02-28 (×3): qty 2

## 2023-02-28 MED ORDER — INSULIN ASPART 100 UNIT/ML IJ SOLN
0.0000 [IU] | Freq: Every day | INTRAMUSCULAR | Status: DC
  Administered 2023-03-02: 2 [IU] via SUBCUTANEOUS
  Filled 2023-02-28: qty 0.05

## 2023-02-28 MED ORDER — PREDNISONE 5 MG PO TABS
2.5000 mg | ORAL_TABLET | ORAL | Status: DC
  Administered 2023-03-01 – 2023-03-05 (×3): 2.5 mg via ORAL
  Filled 2023-02-28 (×3): qty 1

## 2023-02-28 MED ORDER — PREDNISONE 5 MG PO TABS
5.0000 mg | ORAL_TABLET | ORAL | Status: DC
  Administered 2023-02-28 – 2023-03-04 (×3): 5 mg via ORAL
  Filled 2023-02-28 (×3): qty 1

## 2023-02-28 MED ORDER — DULOXETINE HCL 30 MG PO CPEP
90.0000 mg | ORAL_CAPSULE | Freq: Every day | ORAL | Status: DC
  Administered 2023-02-28 – 2023-03-05 (×6): 90 mg via ORAL
  Filled 2023-02-28 (×6): qty 3

## 2023-02-28 MED ORDER — HEPARIN SODIUM (PORCINE) 5000 UNIT/ML IJ SOLN
5000.0000 [IU] | Freq: Three times a day (TID) | INTRAMUSCULAR | Status: DC
  Administered 2023-02-28 – 2023-03-05 (×16): 5000 [IU] via SUBCUTANEOUS
  Filled 2023-02-28 (×16): qty 1

## 2023-02-28 MED ORDER — BACLOFEN 20 MG PO TABS
20.0000 mg | ORAL_TABLET | Freq: Every day | ORAL | Status: DC
  Administered 2023-02-28 – 2023-03-04 (×5): 20 mg via ORAL
  Filled 2023-02-28 (×5): qty 1

## 2023-02-28 MED ORDER — PANTOPRAZOLE SODIUM 40 MG PO TBEC
40.0000 mg | DELAYED_RELEASE_TABLET | Freq: Every day | ORAL | Status: DC
  Administered 2023-02-28 – 2023-03-05 (×6): 40 mg via ORAL
  Filled 2023-02-28 (×6): qty 1

## 2023-02-28 MED ORDER — METOPROLOL SUCCINATE ER 50 MG PO TB24
50.0000 mg | ORAL_TABLET | Freq: Every day | ORAL | Status: DC
  Administered 2023-02-28 – 2023-03-05 (×6): 50 mg via ORAL
  Filled 2023-02-28 (×6): qty 1

## 2023-02-28 MED ORDER — MAGNESIUM OXIDE -MG SUPPLEMENT 400 (240 MG) MG PO TABS
400.0000 mg | ORAL_TABLET | Freq: Two times a day (BID) | ORAL | Status: DC
  Administered 2023-02-28 – 2023-03-05 (×11): 400 mg via ORAL
  Filled 2023-02-28 (×11): qty 1

## 2023-02-28 MED ORDER — DIMETHYL FUMARATE 240 MG PO CPDR
240.0000 mg | DELAYED_RELEASE_CAPSULE | Freq: Two times a day (BID) | ORAL | Status: DC
  Administered 2023-03-01 – 2023-03-05 (×8): 240 mg via ORAL

## 2023-02-28 MED ORDER — INSULIN ASPART 100 UNIT/ML IJ SOLN
0.0000 [IU] | Freq: Three times a day (TID) | INTRAMUSCULAR | Status: DC
  Administered 2023-03-01: 2 [IU] via SUBCUTANEOUS
  Administered 2023-03-01: 1 [IU] via SUBCUTANEOUS
  Administered 2023-03-02: 3 [IU] via SUBCUTANEOUS
  Administered 2023-03-03 – 2023-03-04 (×2): 1 [IU] via SUBCUTANEOUS
  Administered 2023-03-04: 2 [IU] via SUBCUTANEOUS
  Administered 2023-03-05: 1 [IU] via SUBCUTANEOUS
  Filled 2023-02-28: qty 0.09

## 2023-02-28 NOTE — Sepsis Progress Note (Signed)
Elink monitoring for the code sepsis protocol.  

## 2023-02-28 NOTE — Consult Note (Signed)
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 Change foam every 3 days.    Re consult if needed, will not follow at this time. Thanks  [employer]kenna [employer], RN,CWOCN, CNS, CWON-AP [phone])

## 2023-02-28 NOTE — Assessment & Plan Note (Signed)
Chronic. UA negative.

## 2023-02-28 NOTE — ED Notes (Signed)
Assisted RN with changing pt clean brief, clean chuck pads, repositioned

## 2023-02-28 NOTE — Assessment & Plan Note (Signed)
Chronic. Due to MS.

## 2023-02-28 NOTE — Assessment & Plan Note (Signed)
Chronic. Follows with neurology. Is bedbound.

## 2023-02-28 NOTE — H&P (Signed)
History and Physical    Laura Roberts:[id number] DOB: 08/11/1940 DOA: 02/27/2023  DOS: the patient was seen and examined on 02/27/2023  PCP: Marinda Elk, MD   Patient coming from: SNF [facility]  I have personally briefly reviewed patient's old medical records in [facility]  CC: left arm weakness.  HPI: 82 year old African-American female history of multiple sclerosis with now paraplegia, bedbound status, history of hypertension, type 2 diabetes on long-term insulin, chronic sacral decubitus ulcers present on admission, chronic sacral osteomyelitis present on admission, history of colostomy, history of chronic suprapubic catheter, history of chronic steroid use who presents to the ER with reported left-sided weakness.  Unclear how this was interpreted as a stroke as the patient always has left-sided weakness.  Patient lives at [place] nursing home which is right next door to [facility].  Posey Rea why, EMS was called to the nursing home and the patient was brought to [facility] instead.  On arrival temp 102.4 heart rate 149 blood pressure 97/60.  Patient states that her left side of her body is chronically weak secondary to her MS. She says that she has been staying in her room at the nursing home Due to an outbreak of COVID there.  She denies any cough symptoms.  She has not had any diarrhea in her colostomy.  After getting some Tylenol she states she feels better.  Initial lactic acid of 2.4  White count 14.5, hemoglobin 13.7, platelets of 228, 94% neutrophils  Sodium 137, potassium 4.4, chloride 97, bicarb of 23, BUN of 26, creatinine 0.55, glucose of 198 Total protein 7.4, albumin 3.8, AST 58, ALT of 35, alk phos 80, total bili 1.6  COVID was negative, influenza negative, RSV negative  UDS was negative  UA showed specific gravity less than 1.005.  Ketones 20 small leukocyte esterase no bacteria  Chest x-ray showed no acute  cardiopulmonary disease  Head CT was negative for acute infarct  CTPA and CT chest were negative for PE.  There is colonic diverticulosis without diverticulitis.  There is no acute intra-abdominal process.  She has chronic osteomyelitis of the distal sacrum that is unchanged compared to her films from August 2024 and November 2023.  Patient was given IV cefepime, vancomycin and Flagyl.  Blood cultures were obtained.  [facility] hospitalist consulted.   ED Course: covid negative. Febrile to 102.4. HR 149. WBC 14.5. UA negative. CTPA negative for PE.  Review of Systems:  Review of Systems  Constitutional:  Positive for fever.  HENT: Negative.    Eyes: Negative.   Respiratory: Negative.    Cardiovascular: Negative.   Gastrointestinal: Negative.   Genitourinary: Negative.   Musculoskeletal: Negative.   Skin: Negative.   Neurological:        Chronic weakness on left side  Endo/Heme/Allergies: Negative.   Psychiatric/Behavioral: Negative.    All other systems reviewed and are negative.   Past Medical History:  Diagnosis Date   Age related osteoporosis    Anemia    Aphasia    Bronchitis    hx of    Cellulitis and abscess of toe    hx of    Closed supracondylar fracture of right femur, periprosthetic 06/01/2014   Complicated UTI (urinary tract infection) 10/09/2017   Constipation    COPD (chronic obstructive pulmonary disease) (HCC)    Cystostomy in place [facility])    Decubitus ulcer    Decubitus ulcer of ischial area    Right   Degenerative arthritis  osteoarthritis    Depression    Diabetes mellitus without complication (HCC)    type 2    Edema    localized    Full incontinence of feces    GERD (gastroesophageal reflux disease)    Gout    hx of    History of falling    Hyperlipidemia    Hypertension    Hyperthyroidism    Intertrochanteric fracture (HCC)    Left, chronic   Left perineal ischial pressure ulcer 09/18/2021   Multiple sclerosis (HCC)    Advanced    Neurogenic bowel    Neuromuscular disorder (HCC)    Bilateral hand carpal tunnel syndrome   Neuromuscular dysfunction of bladder    Nontoxic multinodular goiter    Obesity    Osteomyelitis of coccyx (HCC) 04/05/2021   Osteoporosis    Other adrenocortical insufficiency (HCC)    Paraplegia (HCC)    LEGS   Personal history of urinary (tract) infections    Polyneuropathy    Pressure ulcer of right buttock, stage 4 (HCC)    Sacral decubitus ulcer    stage IV decubitus ulcer   Sacral osteomyelitis (HCC) 04/05/2021   Spinal stenosis in cervical region    Spinal stenosis of lumbosacral region    Spinal stenosis, thoracic    Tinea unguium    Vitamin D deficiency     Past Surgical History:  Procedure Laterality Date   ABDOMINAL HYSTERECTOMY     APPLICATION OF WOUND VAC  04/07/2021   Procedure: APPLICATION OF WOUND VAC;  Surgeon: Quentin Ore, MD;  Location: [facility];  Service: General;;   BACK SURGERY     BOTOX INJECTION N/A 01/30/2018   Procedure: CYSTOSCOPY BOTOX INJECTION, SUPRAPUBIC EXCHANGE;  Surgeon: Crista Elliot, MD;  Location: [facility];  Service: Urology;  Laterality: N/A;   BOTOX INJECTION N/A 09/02/2018   Procedure: BOTOX INJECTION;  Surgeon: Crista Elliot, MD;  [facility]: [facility];  Service: Urology;  Laterality: N/A;   BOTOX INJECTION N/A 05/10/2019   Procedure: CYSTOSCOPY BOTOX INJECTION, SUPRAPUBIC EXCHANGE;  Surgeon: Crista Elliot, MD;  [facility]: [facility];  Service: Urology;  Laterality: N/A;   BOTOX INJECTION N/A 09/06/2019   Procedure: CYSTOSCOPY BOTOX INJECTION;  Surgeon: Crista Elliot, MD;  [facility]: [facility];  Service: Urology;  Laterality: N/A;   BOTOX INJECTION N/A 04/10/2020   Procedure: CYSTOSCOPY BOTOX INJECTION;  Surgeon: Jerilee Field, MD;  [facility]: [facility];  Service: Urology;  Laterality: N/A;   BOTOX INJECTION N/A 09/13/2020   Procedure: CYSTOSCOPYBOTOX INJECTION SUPRAPUBIC TUBE UPSIZE AND EXCHANGE;  Surgeon: Crista Elliot, MD;   [facility]: [facility];  Service: Urology;  Laterality: N/A;   BOTOX INJECTION N/A 03/07/2021   Procedure: CYSTOSCOPY BOTOX INJECTION AND SUPRAPUBIC TUBE EXCHANGE;  Surgeon: Crista Elliot, MD;  [facility]: [facility];  Service: Urology;  Laterality: N/A;  REQUESTING 48 MINS FOR CASE   BOTOX INJECTION N/A 09/03/2021   Procedure: CYSTOSCOPY BOTOX INJECTION;  Surgeon: Crista Elliot, MD;  [facility]: [facility];  Service: Urology;  Laterality: N/A;   BOTOX INJECTION N/A 04/01/2022   Procedure: CYSTOSCOPYBOTOX INJECTION;  Surgeon: Crista Elliot, MD;  [facility]: [facility];  Service: Urology;  Laterality: N/A;   BOTOX INJECTION N/A 11/04/2022   Procedure: CYSTOSCOPY BOTOX INJECTION 200 UNITS;  Surgeon: Crista Elliot, MD;  [facility]: [facility];  Service: Urology;  Laterality: N/A;   CATARACT EXTRACTION Right    CHOLECYSTECTOMY  COLON SURGERY  2023   w/ colostomy   CYSTOSCOPY N/A 09/02/2018   Procedure: CYSTOSCOPY;  Surgeon: Crista Elliot, MD;  [facility]: [facility];  Service: Urology;  Laterality: N/A;   CYSTOSTOMY N/A 09/02/2018   Procedure: CYSTOSTOMY SUPRAPUBIC;  Surgeon: Crista Elliot, MD;  [facility];  Service: Urology;  Laterality: N/A;  45 MINS   FEMUR IM NAIL Right 06/02/2014   Procedure: INTRAMEDULLARY (IM) RETROGRADE FEMORAL NAILING;  Surgeon: Eulas Post, MD;  Location: [facility];  Service: Orthopedics;  Laterality: Right;   GROIN DISSECTION Left 10/24/2017   Procedure: IRRIGATION AND DEBRIDEMENT, LEFT THIGH ABCESS ;  Surgeon: Ovidio Kin, MD;  Location: [facility];  Service: General;  Laterality: Left;   INCISION AND DRAINAGE PERIRECTAL ABSCESS N/A 04/07/2021   Procedure: DEBRIDEMENT OF SACRAL DECUBITUS ULCER;  Surgeon: Quentin Ore, MD;  [facility]: [facility];  Service: General;  Laterality: N/A;   INSERTION OF SUPRAPUBIC CATHETER N/A 09/06/2019   Procedure: SUPRAPUBIC TUBE CHANGE;  Surgeon: Crista Elliot, MD;  [facility];  Service: Urology;  Laterality: N/A;    INSERTION OF SUPRAPUBIC CATHETER N/A 04/10/2020   Procedure: SUPRAPUBIC CATHETER EXCHANGE/TRACT  DILATION;  Surgeon: Jerilee Field, MD;  [facility]: [facility];  Service: Urology;  Laterality: N/A;   INSERTION OF SUPRAPUBIC CATHETER N/A 09/03/2021   Procedure: SUPRAPUBIC CATHETER EXCHANGE;  Surgeon: Crista Elliot, MD;  [facility];  Service: Urology;  Laterality: N/A;  45 MINS FOR THIS CASE   INSERTION OF SUPRAPUBIC CATHETER N/A 04/01/2022   Procedure: SUPRAPUBIC CATHETER EXCHANGE UPSIZING & DILATION OF SUPRAPUBIC  TRACT;  Surgeon: Crista Elliot, MD;  [facility]: [facility];  Service: Urology;  Laterality: N/A;  45 MINS FOR CASE   INSERTION OF SUPRAPUBIC CATHETER N/A 05/27/2022   Procedure: CYSTOSCOPY SUPRAPUBIC TUBE TRACT DILATION AND SUPRAPUBIC TUBE EXCHANGE;  Surgeon: Crista Elliot, MD;  [facility];  Service: Urology;  Laterality: N/A;  45 MINS FOR CASE   INSERTION OF SUPRAPUBIC CATHETER N/A 11/04/2022   Procedure: SUPRAPUBIC CATHETER CHANGE;  Surgeon: Crista Elliot, MD;  [facility]: [facility];  Service: Urology;  Laterality: N/A;  45 MINS FOR CASE   IR CATHETER TUBE CHANGE  10/13/2017   IR CATHETER TUBE CHANGE  07/15/2018   IR CATHETER TUBE CHANGE  08/26/2019   IR CATHETER TUBE CHANGE  11/25/2019   IR CATHETER TUBE CHANGE  01/06/2020   IR CATHETER TUBE CHANGE  06/13/2020   IR CATHETER TUBE CHANGE  08/01/2020   LAPAROSCOPY N/A 04/07/2021   Procedure: LAPAROSCOPY DIAGNOSTIC; LAPAROSCOPIC CREATION OF TRANSVERSE COLOSTOMY;  Surgeon: Quentin Ore, MD;  [facility];  Service: General;  Laterality: N/A;   left hand carpal tunnel surgery     REPLACEMENT TOTAL KNEE BILATERAL  07/01/2002     reports that she has never smoked. She has never used smokeless tobacco. She reports that she does not drink alcohol and does not use drugs.  Allergies  Allergen Reactions   Codeine Shortness Of Breath and Other (See Comments)    Difficult breathing and skin problem   Ultram  [Tramadol] Shortness Of Breath and Other (See Comments)    Difficult breathing and skin peeling   Januvia [Sitagliptin] Rash and Other (See Comments)    Blisters    Family History  Problem Relation Age of Onset   Multiple sclerosis Other        neices.    Cancer Mother  Diabetes Mother    GI Bleed Sister        diverticulitis   Thyroid disease Neg Hx     Prior to Admission medications   Medication Sig Start Date End Date Taking? Authorizing Provider  acetaminophen (TYLENOL) 325 MG tablet Take 650 mg by mouth every 6 (six) hours as needed for mild pain or fever.    Yes [provider]  Amino Acids-Protein Hydrolys (FEEDING SUPPLEMENT, PRO-STAT SUGAR FREE 64,) LIQD Take 30 mLs by mouth 2 (two) times daily.   Yes [provider]  bacitracin 500 UNIT/GM ointment Apply 1 Application topically See admin instructions. Apply to suprapubic catheter site with dressing   Yes [provider]  baclofen (LIORESAL) 10 MG tablet Take 10-20 mg by mouth See admin instructions. Take 15 mg by mouth in the morning, 10 mg midday, and 20 mg at bedtime   Yes [provider]  baclofen (LIORESAL) 20 MG tablet Take 20 mg by mouth at bedtime.   Yes [provider]  barrier cream (NON-SPECIFIED) CREA Apply 1 application  topically See admin instructions. Apply to right ischium and bilateral buttocks 2 times a day   Yes [provider]  bisacodyl (DULCOLAX) 10 MG suppository Place 10 mg rectally daily as needed for moderate constipation (constipation not relieved by milk of magnesium).   Yes [provider]  calcium citrate (CALCITRATE - DOSED IN MG ELEMENTAL CALCIUM) 950 (200 Ca) MG tablet Take 200 mg of elemental calcium by mouth daily. (0800)   Yes [provider]  cholecalciferol (VITAMIN D) 25 MCG (1000 UNIT) tablet Take 1,000 Units by mouth daily. (1000)   Yes [provider]  Dimethyl Fumarate 240 MG CPDR One po bid Patient  taking differently: Take 240 mg by mouth 2 (two) times daily. 09/12/22  Yes Sater, Pearletha Furl, MD  Dulaglutide (TRULICITY) 3 MG/0.5ML SOPN Inject 3 mg into the skin every Tuesday. (1000)   Yes [provider]  DULoxetine (CYMBALTA) 30 MG capsule Take 90 mg by mouth daily.   Yes [provider]  FEROSUL 325 (65 Fe) MG tablet Take 325 mg by mouth 2 (two) times daily with a meal.   Yes [provider]  furosemide (LASIX) 20 MG tablet Take 20 mg by mouth every other day.   Yes [provider]  gabapentin (NEURONTIN) 100 MG capsule Take 200 mg by mouth 3 (three) times daily.   Yes [provider]  magnesium hydroxide (MILK OF MAGNESIA) 400 MG/5ML suspension Take 30 mLs by mouth daily as needed for mild constipation.   Yes [provider]  magnesium oxide (MAG-OX) 400 MG tablet Take 400 mg by mouth 2 (two) times daily. (1000 & 2200)   Yes [provider]  methimazole (TAPAZOLE) 5 MG tablet Take 5 mg by mouth daily at 6 (six) AM.   Yes [provider]  metoprolol succinate (TOPROL-XL) 50 MG 24 hr tablet Take 50 mg by mouth daily after breakfast. (0800)   Yes [provider]  Multiple Vitamin (MULTIVITAMIN WITH MINERALS) TABS tablet Take 1 tablet by mouth in the morning. (1000)   Yes [provider]  NON FORMULARY Take 120 mLs by mouth See admin instructions. MedPass- Drink 120 ml's by mouth three times a day if Glucerna id not available   Yes [provider]  ondansetron (ZOFRAN) 4 MG tablet Take 4 mg by mouth every 6 (six) hours as needed for nausea.   Yes [provider]  pantoprazole (PROTONIX) 40 MG tablet Take 40 mg by mouth daily at 6 (six) AM. (0600)   Yes [provider]  polyethylene glycol (MIRALAX / GLYCOLAX) 17 g packet Take 17 g by mouth daily.   Yes [provider]  predniSONE (DELTASONE) 2.5 MG tablet Take 2.5 mg by mouth every other day. Alternating days with 5 mg dose    Yes [provider]  predniSONE (DELTASONE) 5 MG tablet Take 5 mg by mouth every other day. Alternating days with 2.5 mg dose   Yes [provider]  SF 5000 PLUS 1.1 % CREA dental cream Place 1 application  onto teeth daily at 8 pm. (2000) Pea size  Spit our excess and do not rinse water 12/13/20  Yes [provider]  Sodium Phosphates (RA SALINE ENEMA RE) Place 1 enema rectally daily as needed (severe constipation (not relieved by bisacodyl) AND CALL MD IF NO RELIEF FROM ENEMA).   Yes [provider]  solifenacin (VESICARE) 10 MG tablet Take 10 mg by mouth daily.   Yes [provider]  vitamin C (ASCORBIC ACID) 500 MG tablet Take 500 mg by mouth in the morning and at bedtime. (1000 & 2200)   Yes [provider]    Physical Exam: Vitals:   02/27/23 2102 02/27/23 2305 02/27/23 2308 02/28/23 0030  BP:   (!) 92/51 114/65  Pulse: (!) 123  (!) 121 (!) 116  Resp: 18  20 17   Temp:  99.4 F (37.4 C)    TempSrc:  Oral    SpO2: 91%  99% 91%  Weight:      Height:        Physical Exam Vitals and nursing note reviewed.  Constitutional:      General: She is not in acute distress.    Appearance: She is obese. She is not toxic-appearing.  HENT:     Head: Normocephalic and atraumatic.     Nose: Nose normal.  Eyes:     General: No scleral icterus. Cardiovascular:     Rate and Rhythm: Regular rhythm. Tachycardia present.  Pulmonary:     Effort: Pulmonary effort is normal.     Breath sounds: Normal breath sounds.  Abdominal:     General: Abdomen is protuberant. There is no distension.     Palpations: Abdomen is soft.     Tenderness: There is no abdominal tenderness.    Skin:    General: Skin is warm and dry.     Capillary Refill: Capillary refill takes less than 2 seconds.       Neurological:     Mental Status: She is alert and oriented to person, place, and time.         Labs on Admission: I have personally reviewed following  labs and imaging studies  CBC: Recent Labs  Lab 02/27/23 2000 02/27/23 2014  WBC 14.5*  --   NEUTROABS 13.7*  --   HGB 13.7 14.6  HCT 43.0 43.0  MCV 92.9  --   PLT 228  --    Basic Metabolic Panel: Recent Labs  Lab 02/27/23 2000 02/27/23 2014  NA 137 137  K 4.4 4.1  CL 97* 100  CO2 23  --   GLUCOSE 198* 208*  BUN 26* 27*  CREATININE 0.55 0.60  CALCIUM 9.4  --    GFR: Estimated Creatinine Clearance: 54.9 mL/min (by C-G formula based on SCr of 0.6 mg/dL). Liver Function Tests: Recent Labs  Lab 02/27/23 2000  AST 58*  ALT 35  ALKPHOS 80  BILITOT 1.6*  PROT 7.4  ALBUMIN 3.8   Coagulation Profile: Recent Labs  Lab 02/27/23 2000  INR 1.0   Cardiac Enzymes: Recent Labs  Lab 02/27/23 2000 02/27/23 2200  TROPONINIHS 7 11   Urine analysis:    Component Value Date/Time   COLORURINE YELLOW 02/27/2023 2246   APPEARANCEUR CLEAR 02/27/2023 2246   LABSPEC <1.005 (L) 02/27/2023 2246   PHURINE 5.0 02/27/2023 2246   GLUCOSEU NEGATIVE 02/27/2023 2246   HGBUR NEGATIVE 02/27/2023 2246   BILIRUBINUR NEGATIVE 02/27/2023 2246   KETONESUR 20 (A) 02/27/2023 2246   PROTEINUR NEGATIVE 02/27/2023 2246   UROBILINOGEN 1.0 08/04/2012 1938   NITRITE NEGATIVE 02/27/2023 2246   LEUKOCYTESUR SMALL (A) 02/27/2023 2246    Radiological Exams on Admission: I have personally reviewed images CT ABDOMEN PELVIS W CONTRAST  Result Date: 02/27/2023 CLINICAL DATA:  Left arm weakness, diverticulitis, sepsis, shortness of breath EXAM: CT ANGIOGRAPHY CHEST CT ABDOMEN AND PELVIS WITH CONTRAST TECHNIQUE: Multidetector CT imaging of the chest was performed using the standard protocol during bolus administration of intravenous contrast. Multiplanar CT image reconstructions and MIPs were obtained to evaluate the vascular anatomy. Multidetector CT imaging of the abdomen and pelvis was performed using the standard protocol during bolus administration of intravenous contrast. RADIATION DOSE REDUCTION:  This exam was performed according to the departmental dose-optimization program which includes automated exposure control, adjustment of the mA and/or kV according to patient size and/or use of iterative reconstruction technique. CONTRAST:  OMNIPAQUE IOHEXOL 350 MG/ML SOLN COMPARISON:  02/27/2023, 05/02/2022 FINDINGS: CTA CHEST FINDINGS Cardiovascular: This is a technically adequate evaluation of the pulmonary vasculature. No filling defects or pulmonary emboli. Mild cardiomegaly without pericardial effusion. No evidence of thoracic aortic aneurysm or dissection. Atherosclerosis of the aorta and coronary vasculature. Mediastinum/Nodes: Heterogeneous asymmetric enlargement of the left lobe thyroid. Segmental areas soft g is noted, nonspecific. No esophageal wall thickening. Trachea is unremarkable. No pathologic adenopathy. Lungs/Pleura: No acute airspace disease, effusion, or pneumothorax. Central airways are patent. Musculoskeletal: No acute or destructive bony abnormalities. Severe degenerative changes of the bilateral shoulders. Progressive severe spondylosis at the thoracolumbar junction, with degenerative anterolisthesis of T11 relative T12. Prior T12 laminectomy and T11 laminotomy. Reconstructed images demonstrate no additional findings. Review of the MIP images confirms the above findings. CT ABDOMEN and PELVIS FINDINGS Hepatobiliary: Coarse benign appearing calcifications are seen within the ventral aspect right lobe liver. No other focal liver abnormalities. Gallbladder is unremarkable, with expected postsurgical dilatation of the common bile duct. Pancreas: Unremarkable. No pancreatic ductal dilatation or surrounding inflammatory changes. Spleen: Normal in size without focal abnormality. Adrenals/Urinary Tract: Kidneys are unremarkable, with no urinary tract calculi or obstructive uropathy. The adrenals are stable. Bladder is decompressed with a suprapubic catheter, with chronic nonspecific bladder  wall thickening. Stomach/Bowel: Loop colostomy within the left mid abdomen. No bowel obstruction or ileus. Colonic diverticulosis without evidence of acute diverticulitis. No bowel wall thickening or inflammatory change. Vascular/Lymphatic: Aortic atherosclerosis. No enlarged abdominal or pelvic lymph nodes. Reproductive: Status post hysterectomy. No adnexal masses. Other: No free fluid or free intraperitoneal gas. Musculoskeletal: Decubitus ulcers are seen within the mid sacral region and overlying the left ischium. There is evidence of chronic osteomyelitis of the sacrum. I do not see any bony destruction at the left ischium. Chronic posttraumatic and postsurgical changes of the bilateral hips. No acute displaced fracture. Reconstructed images demonstrate no additional findings. Review of the MIP images confirms the above findings. IMPRESSION: Chest: 1. No  evidence of pulmonary embolus. 2. Cardiomegaly and coronary artery atherosclerosis. 3.  Aortic Atherosclerosis (ICD10-I70.0). Abdomen/pelvis: 1. No acute intra-abdominal or intrapelvic process. 2. Colonic diverticulosis without diverticulitis. 3. Loop colostomy left mid abdomen.  No bowel obstruction or ileus. 4. Decubitus ulcers overlying the sacrum and left ischium. Evidence of chronic osteomyelitis of the distal sacrum unchanged. 5. Chronic indwelling suprapubic catheter, with nonspecific bladder wall thickening. 6.  Aortic Atherosclerosis (ICD10-I70.0). Electronically Signed   By: Sharlet Salina M.D.   On: 02/27/2023 22:55   CT Angio Chest PE W and/or Wo Contrast  Result Date: 02/27/2023 CLINICAL DATA:  Left arm weakness, diverticulitis, sepsis, shortness of breath EXAM: CT ANGIOGRAPHY CHEST CT ABDOMEN AND PELVIS WITH CONTRAST TECHNIQUE: Multidetector CT imaging of the chest was performed using the standard protocol during bolus administration of intravenous contrast. Multiplanar CT image reconstructions and MIPs were obtained to evaluate the vascular  anatomy. Multidetector CT imaging of the abdomen and pelvis was performed using the standard protocol during bolus administration of intravenous contrast. RADIATION DOSE REDUCTION: This exam was performed according to the departmental dose-optimization program which includes automated exposure control, adjustment of the mA and/or kV according to patient size and/or use of iterative reconstruction technique. CONTRAST:  OMNIPAQUE IOHEXOL 350 MG/ML SOLN COMPARISON:  02/27/2023, 05/02/2022 FINDINGS: CTA CHEST FINDINGS Cardiovascular: This is a technically adequate evaluation of the pulmonary vasculature. No filling defects or pulmonary emboli. Mild cardiomegaly without pericardial effusion. No evidence of thoracic aortic aneurysm or dissection. Atherosclerosis of the aorta and coronary vasculature. Mediastinum/Nodes: Heterogeneous asymmetric enlargement of the left lobe thyroid. Segmental areas soft g is noted, nonspecific. No esophageal wall thickening. Trachea is unremarkable. No pathologic adenopathy. Lungs/Pleura: No acute airspace disease, effusion, or pneumothorax. Central airways are patent. Musculoskeletal: No acute or destructive bony abnormalities. Severe degenerative changes of the bilateral shoulders. Progressive severe spondylosis at the thoracolumbar junction, with degenerative anterolisthesis of T11 relative T12. Prior T12 laminectomy and T11 laminotomy. Reconstructed images demonstrate no additional findings. Review of the MIP images confirms the above findings. CT ABDOMEN and PELVIS FINDINGS Hepatobiliary: Coarse benign appearing calcifications are seen within the ventral aspect right lobe liver. No other focal liver abnormalities. Gallbladder is unremarkable, with expected postsurgical dilatation of the common bile duct. Pancreas: Unremarkable. No pancreatic ductal dilatation or surrounding inflammatory changes. Spleen: Normal in size without focal abnormality. Adrenals/Urinary Tract: Kidneys are  unremarkable, with no urinary tract calculi or obstructive uropathy. The adrenals are stable. Bladder is decompressed with a suprapubic catheter, with chronic nonspecific bladder wall thickening. Stomach/Bowel: Loop colostomy within the left mid abdomen. No bowel obstruction or ileus. Colonic diverticulosis without evidence of acute diverticulitis. No bowel wall thickening or inflammatory change. Vascular/Lymphatic: Aortic atherosclerosis. No enlarged abdominal or pelvic lymph nodes. Reproductive: Status post hysterectomy. No adnexal masses. Other: No free fluid or free intraperitoneal gas. Musculoskeletal: Decubitus ulcers are seen within the mid sacral region and overlying the left ischium. There is evidence of chronic osteomyelitis of the sacrum. I do not see any bony destruction at the left ischium. Chronic posttraumatic and postsurgical changes of the bilateral hips. No acute displaced fracture. Reconstructed images demonstrate no additional findings. Review of the MIP images confirms the above findings. IMPRESSION: Chest: 1. No evidence of pulmonary embolus. 2. Cardiomegaly and coronary artery atherosclerosis. 3.  Aortic Atherosclerosis (ICD10-I70.0). Abdomen/pelvis: 1. No acute intra-abdominal or intrapelvic process. 2. Colonic diverticulosis without diverticulitis. 3. Loop colostomy left mid abdomen.  No bowel obstruction or ileus. 4. Decubitus ulcers overlying the sacrum and left  ischium. Evidence of chronic osteomyelitis of the distal sacrum unchanged. 5. Chronic indwelling suprapubic catheter, with nonspecific bladder wall thickening. 6.  Aortic Atherosclerosis (ICD10-I70.0). Electronically Signed   By: Sharlet Salina M.D.   On: 02/27/2023 22:55   CT HEAD WO CONTRAST  Result Date: 02/27/2023 CLINICAL DATA:  Neuro deficit, acute, stroke suspected EXAM: CT HEAD WITHOUT CONTRAST TECHNIQUE: Contiguous axial images were obtained from the base of the skull through the vertex without intravenous contrast.  RADIATION DOSE REDUCTION: This exam was performed according to the departmental dose-optimization program which includes automated exposure control, adjustment of the mA and/or kV according to patient size and/or use of iterative reconstruction technique. COMPARISON:  Brain MR 03/09/21 FINDINGS: Brain: No evidence of acute infarction, hemorrhage, hydrocephalus, extra-axial collection or mass lesion/mass effect. Extensive periventricular hypodensity likely represents sequela multiple sclerosis. Vascular: No hyperdense vessel or unexpected calcification. Skull: Normal. Negative for fracture or focal lesion. Sinuses/Orbits: No middle ear or mastoid effusion. Paranasal sinuses are notable for complete opacification of the right sphenoid sinus with osseous changes suggestive of chronic right sphenoid sinusitis. Other: None. IMPRESSION: No acute intracranial abnormality Electronically Signed   By: Lorenza Cambridge M.D.   On: 02/27/2023 20:43   DG Chest Port 1 View  Result Date: 02/27/2023 CLINICAL DATA:  Sepsis. Left arm weakness and leaning. Shortness of breath. EXAM: PORTABLE CHEST 1 VIEW COMPARISON:  04/05/2021 FINDINGS: Patient rotation limits examination. Shallow inspiration. Heart size and pulmonary vascularity are probably normal for technique. No pleural effusions. No pneumothorax. Mediastinal contours appear intact. IMPRESSION: Shallow inspiration.  No evidence of active pulmonary disease. Electronically Signed   By: Burman Nieves M.D.   On: 02/27/2023 20:22    EKG: My personal interpretation of EKG shows: sinus tachycardia    Assessment/Plan Principal Problem:   SIRS of non-infectious origin w acute organ dysfunction (HCC) Active Problems:   Multiple sclerosis (HCC)   Essential hypertension   Paraplegia (HCC)   Type 2 diabetes mellitus with diabetic neuropathy, with long-term current use of insulin (HCC)   Chronic suprapubic catheter (HCC)   Decubitus ulcer   Chronic osteomyelitis (HCC)   S/P  colostomy (HCC)   DNR (do not resuscitate)/DNI(Do not intubate)    Assessment and Plan: * SIRS of non-infectious origin w acute organ dysfunction (HCC) Observation med/tele bed. Pt with fever and tachycardia. CTPA negative for PE. CT abd shows chronic osteo of sacral. Pt's decub ulcers are healing well and without any drainage. Pt given IV cefepime and vanco. Covid is negative. Do not have a source for infection but pt is still febrile and tachycardic. Will continue to monitor off abx. Check procalcitonin. Will add RVP.  S/P colostomy (HCC) Chronic.  Chronic osteomyelitis (HCC) Stable. Doubt this is the source of her fever/tachycardia.  Decubitus ulcer Chronic. Presents on admission. See pictures. Wounds without any drainage. RN reports that family said that pt's wounds are healing nicely.       Chronic suprapubic catheter (HCC) Chronic. UA negative.  Type 2 diabetes mellitus with diabetic neuropathy, with long-term current use of insulin (HCC) Stable. Add SSI.  Paraplegia (HCC) Chronic. Due to MS.  Essential hypertension Stable. Continue with toprol-xl. Hold lasix while pt is febrile.  Multiple sclerosis (HCC) Chronic. Follows with neurology. Is bedbound.  DNR (do not resuscitate)/DNI(Do not intubate) Reviewed DNR and most form      DVT prophylaxis: SQ Heparin Code Status: DNR/DNI(Do NOT Intubate). Verified DNR/most/Vynca Family Communication: no family at bedside  Disposition Plan: return to SNF  Consults called: none  Admission status: Observation, Telemetry bed   Carollee Herter, DO [facility] 02/28/2023, 1:22 AM

## 2023-02-28 NOTE — Assessment & Plan Note (Signed)
Chronic. 

## 2023-02-28 NOTE — Care Plan (Addendum)
This 82 years old female with PMH significant of multiple sclerosis with paraplegia, bedbound status, history of hypertension, type 2 diabetes on insulin, chronic sacral decubitus ulcer POA, chronic sacral osteomyelitis POA, history of colostomy, history of chronic suprapubic catheter, history of chronic steroid use presented in the ED with left-sided weakness.  EMS was called for patient having left-sided weakness interpreted as a stroke.  Patient has left-sided weakness from multiple sclerosis.  Workup in the ED.  CT head negative for acute infarct, CT chest ruled out pulmonary embolism.  CT abdomen and pelvis no acute intra-abdominal or intrapelvic abnormalities noted.  Patient is admitted for sepsis with unknown cause.  Patient does have chronic decubitus ulcer which does not appear infected.  Patient started on empiric antibiotics.  Procalcitonin is 40.1.  Will continue antibiotics and await blood and urine cultures.  Patient was seen and examined at bedside.  Overnight events noted.  Patient seems improved.

## 2023-02-28 NOTE — ED Notes (Signed)
ED TO INPATIENT HANDOFF REPORT  Name/Age/Gender Laura Roberts 82 y.o. female  Code Status    Code Status Orders  (From admission, onward)           Start     Ordered   02/28/23 0119  Do not attempt resuscitation (DNR)- Limited -Do Not Intubate (DNI)  Continuous       Question Answer Comment  If pulseless and not breathing No CPR or chest compressions.   In Pre-Arrest Conditions (Patient Is Breathing and Has A Pulse) Do not intubate. Provide all appropriate non-invasive medical interventions. Avoid ICU transfer unless indicated or required.   Consent: Discussion documented in EHR or advanced directives reviewed      02/28/23 0119           Code Status History     Date Active Date Inactive Code Status Order ID Comments User Context   04/05/2021 1923 04/14/2021 1340 DNR [id number]  Alberteen Sam, MD ED   01/30/2018 2044 01/31/2018 2309 Full Code [id number]  Heloise Purpura, MD Inpatient   10/24/2017 2037 10/29/2017 1734 DNR [id number]  Merlene Laughter, DO Inpatient   10/09/2017 1820 10/15/2017 0153 DNR [id number]  John Giovanni, MD ED   08/13/2017 2234 08/18/2017 1824 DNR [id number]  Darreld Mclean, MD ED   11/22/2016 1827 11/26/2016 2258 DNR [id number]  Lora Paula, MD ED   06/02/2014 1622 06/06/2014 2045 Full Code [id number]  Eulas Post, MD Inpatient   06/01/2014 1949 06/02/2014 1622 Full Code [id number]  Dow Adolph, MD Inpatient   07/23/2012 1618 08/07/2012 1418 DNR [id number]  Princella Pellegrini, RN Inpatient   07/21/2012 2114 07/23/2012 1618 DNR [id number]  Mertha Finders, RN Inpatient   07/21/2012 2058 07/21/2012 2114 DNR [id number]  Vassie Loll, MD Inpatient      Advance Directive Documentation    Flowsheet Row Most Recent Value  Type of Advance Directive Out of facility DNR (pink MOST or yellow form)  Pre-existing out of facility DNR order (yellow form or pink MOST form) --  "MOST" Form in Place? --       Home/SNF/Other Skilled nursing facility  Chief  Complaint SIRS of non-infectious origin w acute organ dysfunction (HCC) [R65.11]  Level of Care/Admitting Diagnosis ED Disposition     ED Disposition  Admit   Condition  --   Comment  Hospital Area: [facility] [[id number]]  Level of Care: Telemetry [5]  Admit to tele based on following criteria: Complex arrhythmia (Bradycardia/Tachycardia)  May place patient in observation at [facility] or [facility] if equivalent level of care is available:: No  Covid Evaluation: Asymptomatic - no recent exposure (last 10 days) testing not required  Diagnosis: SIRS of non-infectious origin w acute organ dysfunction [facility]) [[id number]]  Admitting Physician: Imogene Burn, ERIC [[id number]]  Attending Physician: Imogene Burn, ERIC [[id number]]          Medical History Past Medical History:  Diagnosis Date   Age related osteoporosis    Anemia    Aphasia    Bronchitis    hx of    Cellulitis and abscess of toe    hx of    Closed supracondylar fracture of right femur, periprosthetic 06/01/2014   Complicated UTI (urinary tract infection) 10/09/2017   Constipation    COPD (chronic obstructive pulmonary disease) (HCC)    Cystostomy in place [facility])    Decubitus ulcer    Decubitus ulcer of ischial area    Right   Degenerative arthritis    osteoarthritis  Depression    Diabetes mellitus without complication (HCC)    type 2    Edema    localized    Full incontinence of feces    GERD (gastroesophageal reflux disease)    Gout    hx of    History of falling    Hyperlipidemia    Hypertension    Hyperthyroidism    Intertrochanteric fracture (HCC)    Left, chronic   Left perineal ischial pressure ulcer 09/18/2021   Multiple sclerosis (HCC)    Advanced   Neurogenic bowel    Neuromuscular disorder (HCC)    Bilateral hand carpal tunnel syndrome   Neuromuscular dysfunction of bladder    Nontoxic multinodular goiter    Obesity    Osteomyelitis of coccyx (HCC) 04/05/2021   Osteoporosis    Other  adrenocortical insufficiency (HCC)    Paraplegia (HCC)    LEGS   Personal history of urinary (tract) infections    Polyneuropathy    Pressure ulcer of right buttock, stage 4 (HCC)    Sacral decubitus ulcer    stage IV decubitus ulcer   Sacral osteomyelitis (HCC) 04/05/2021   Spinal stenosis in cervical region    Spinal stenosis of lumbosacral region    Spinal stenosis, thoracic    Tinea unguium    Vitamin D deficiency     Allergies Allergies  Allergen Reactions   Codeine Shortness Of Breath and Other (See Comments)    Difficult breathing and skin problem   Ultram [Tramadol] Shortness Of Breath and Other (See Comments)    Difficult breathing and skin peeling   Januvia [Sitagliptin] Rash and Other (See Comments)    Blisters    IV Location/Drains/Wounds Patient Lines/Drains/Airways Status     Active Line/Drains/Airways     Name Placement date Placement time Site Days   Peripheral IV 02/27/23 20 G Right Antecubital 02/27/23  1958  Antecubital  1   Peripheral IV 02/27/23 20 G 1" Anterior;Right Forearm 02/27/23  2004  Forearm  1   PICC Single Lumen 09/03/21 Right 09/03/21  0933  --  543   Colostomy LUQ 04/07/21  1259  LUQ  692   Suprapubic Catheter Latex 20 Fr. 11/04/22  1201  Latex  116   Pressure Injury 02/28/23 Sacrum Right Stage 4 - Full thickness tissue loss with exposed bone, tendon or muscle. chronic skin injury 02/28/23  5409  -- less than 1            Labs/Imaging Results for orders placed or performed during the hospital encounter of 02/27/23 (from the past 48 hour(s))  Respiratory (~20 pathogens) panel by PCR     Status: None   Collection Time: 02/27/23  1:06 AM   Specimen: Nasopharyngeal Swab; Respiratory  Result Value Ref Range   Adenovirus NOT DETECTED NOT DETECTED   Coronavirus 229E NOT DETECTED NOT DETECTED    Comment: (NOTE) The Coronavirus on the Respiratory Panel, DOES NOT test for the novel  Coronavirus (2019 nCoV)    Coronavirus HKU1 NOT  DETECTED NOT DETECTED   Coronavirus NL63 NOT DETECTED NOT DETECTED   Coronavirus OC43 NOT DETECTED NOT DETECTED   Metapneumovirus NOT DETECTED NOT DETECTED   Rhinovirus / Enterovirus NOT DETECTED NOT DETECTED   Influenza A NOT DETECTED NOT DETECTED   Influenza B NOT DETECTED NOT DETECTED   Parainfluenza Virus 1 NOT DETECTED NOT DETECTED   Parainfluenza Virus 2 NOT DETECTED NOT DETECTED   Parainfluenza Virus 3 NOT DETECTED NOT DETECTED   Parainfluenza Virus  4 NOT DETECTED NOT DETECTED   Respiratory Syncytial Virus NOT DETECTED NOT DETECTED   Bordetella pertussis NOT DETECTED NOT DETECTED   Bordetella Parapertussis NOT DETECTED NOT DETECTED   Chlamydophila pneumoniae NOT DETECTED NOT DETECTED   Mycoplasma pneumoniae NOT DETECTED NOT DETECTED    Comment: Performed at [facility], [address], [place], [place] [address]  Ethanol     Status: None   Collection Time: 02/27/23  8:00 PM  Result Value Ref Range   Alcohol, Ethyl (B) <10 <10 mg/dL    Comment: (NOTE) Lowest detectable limit for serum alcohol is 10 mg/dL.  For medical purposes only. Performed at [facility], [address] [place], [place] [address]   Protime-INR     Status: None   Collection Time: 02/27/23  8:00 PM  Result Value Ref Range   Prothrombin Time 13.7 11.4 - 15.2 seconds   INR 1.0 0.8 - 1.2    Comment: (NOTE) INR goal varies based on device and disease states. Performed at [facility], [address] [place], [place] [address]   APTT     Status: Abnormal   Collection Time: 02/27/23  8:00 PM  Result Value Ref Range   aPTT 22 (L) 24 - 36 seconds    Comment: Performed at [facility], [address] [place], [place] [address]  CBC     Status: Abnormal   Collection Time: 02/27/23  8:00 PM  Result Value Ref Range   WBC 14.5 (H) 4.0 - 10.5 K/uL   RBC 4.63 3.87 - 5.11 MIL/uL   Hemoglobin 13.7 12.0 - 15.0 g/dL   HCT 29.5 62.1 - 30.8 %    MCV 92.9 80.0 - 100.0 fL   MCH 29.6 26.0 - 34.0 pg   MCHC 31.9 30.0 - 36.0 g/dL   RDW 65.7 84.6 - 96.2 %   Platelets 228 150 - 400 K/uL   nRBC 0.0 0.0 - 0.2 %    Comment: Performed at [facility], [address], [place], [place] [address]  Differential     Status: Abnormal   Collection Time: 02/27/23  8:00 PM  Result Value Ref Range   Neutrophils Relative % 94 %   Neutro Abs 13.7 (H) 1.7 - 7.7 K/uL   Lymphocytes Relative 2 %   Lymphs Abs 0.3 (L) 0.7 - 4.0 K/uL   Monocytes Relative 3 %   Monocytes Absolute 0.4 0.1 - 1.0 K/uL   Eosinophils Relative 0 %   Eosinophils Absolute 0.0 0.0 - 0.5 K/uL   Basophils Relative 0 %   Basophils Absolute 0.0 0.0 - 0.1 K/uL   Immature Granulocytes 1 %   Abs Immature Granulocytes 0.10 (H) 0.00 - 0.07 K/uL    Comment: Performed at [facility], [address], [place], [place] [address]  Comprehensive metabolic panel     Status: Abnormal   Collection Time: 02/27/23  8:00 PM  Result Value Ref Range   Sodium 137 135 - 145 mmol/L   Potassium 4.4 3.5 - 5.1 mmol/L    Comment: HEMOLYSIS AT THIS LEVEL MAY AFFECT RESULT   Chloride 97 (L) 98 - 111 mmol/L   CO2 23 22 - 32 mmol/L   Glucose, Bld 198 (H) 70 - 99 mg/dL    Comment: Glucose reference range applies only to samples taken after fasting for at least 8 hours.   BUN 26 (H) 8 - 23 mg/dL   Creatinine, Ser 0.10  0.44 - 1.00 mg/dL   Calcium 9.4 8.9 - 64.3 mg/dL   Total Protein 7.4 6.5 - 8.1 g/dL   Albumin 3.8 3.5 - 5.0 g/dL   AST 58 (H) 15 - 41 U/L    Comment: HEMOLYSIS AT THIS LEVEL MAY AFFECT RESULT   ALT 35 0 - 44 U/L    Comment: HEMOLYSIS AT THIS LEVEL MAY AFFECT RESULT   Alkaline Phosphatase 80 38 - 126 U/L   Total Bilirubin 1.6 (H) 0.3 - 1.2 mg/dL    Comment: HEMOLYSIS AT THIS LEVEL MAY AFFECT RESULT   GFR, Estimated >60 >60 mL/min    Comment: (NOTE) Calculated using the CKD-EPI Creatinine Equation (2021)    Anion gap 17 (H) 5 - 15    Comment:  Performed at [facility], [address] [place], [place] [address]  Troponin I (High Sensitivity)     Status: None   Collection Time: 02/27/23  8:00 PM  Result Value Ref Range   Troponin I (High Sensitivity) 7 <18 ng/L    Comment: (NOTE) Elevated high sensitivity troponin I (hsTnI) values and significant  changes across serial measurements may suggest ACS but many other  chronic and acute conditions are known to elevate hsTnI results.  Refer to the "Links" section for chest pain algorithms and additional  guidance. Performed at [facility], [address], [place], [place] [address]   Blood Culture (routine x 2)     Status: None (Preliminary result)   Collection Time: 02/27/23  8:10 PM   Specimen: BLOOD  Result Value Ref Range   Specimen Description      BLOOD BLOOD RIGHT FOREARM Performed at [facility], [address] [place], [place] [address]    Special Requests      BOTTLES DRAWN AEROBIC AND ANAEROBIC Blood Culture adequate volume Performed at [facility], [address] [place], [place] [address]    Culture      NO GROWTH < 12 HOURS Performed at [facility], [address] [place], [place] [address]    Report Status PENDING   I-stat chem 8, ED     Status: Abnormal   Collection Time: 02/27/23  8:14 PM  Result Value Ref Range   Sodium 137 135 - 145 mmol/L   Potassium 4.1 3.5 - 5.1 mmol/L   Chloride 100 98 - 111 mmol/L   BUN 27 (H) 8 - 23 mg/dL   Creatinine, Ser 5.73 0.44 - 1.00 mg/dL   Glucose, Bld 220 (H) 70 - 99 mg/dL    Comment: Glucose reference range applies only to samples taken after fasting for at least 8 hours.   Calcium, Ion 1.16 1.15 - 1.40 mmol/L   TCO2 26 22 - 32 mmol/L   Hemoglobin 14.6 12.0 - 15.0 g/dL   HCT 25.4 27.0 - 62.3 %  Blood Culture (routine x 2)     Status: None (Preliminary result)   Collection Time: 02/27/23  8:20 PM   Specimen: BLOOD  Result Value  Ref Range   Specimen Description      BLOOD RIGHT ANTECUBITAL Performed at [facility], [address] [place], [place] [address]    Special Requests      BOTTLES DRAWN AEROBIC AND ANAEROBIC Blood Culture results may not be optimal due to an excessive volume of blood received in culture bottles Performed at [facility], [address], [place], [place] [address]    Culture  NO GROWTH < 12 HOURS Performed at [facility], [address] [place], [place] [address]    Report Status PENDING   I-Stat Lactic Acid, ED     Status: Abnormal   Collection Time: 02/27/23  8:34 PM  Result Value Ref Range   Lactic Acid, Venous 2.4 (HH) 0.5 - 1.9 mmol/L   Comment NOTIFIED PHYSICIAN   Resp panel by RT-PCR (RSV, Flu A&B, Covid) Anterior Nasal Swab     Status: None   Collection Time: 02/27/23  8:35 PM   Specimen: Anterior Nasal Swab  Result Value Ref Range   SARS Coronavirus 2 by RT PCR NEGATIVE NEGATIVE    Comment: (NOTE) SARS-CoV-2 target nucleic acids are NOT DETECTED.  The SARS-CoV-2 RNA is generally detectable in upper respiratory specimens during the acute phase of infection. The lowest concentration of SARS-CoV-2 viral copies this assay can detect is 138 copies/mL. A negative result does not preclude SARS-Cov-2 infection and should not be used as the sole basis for treatment or other patient management decisions. A negative result may occur with  improper specimen collection/handling, submission of specimen other than nasopharyngeal swab, presence of viral mutation(s) within the areas targeted by this assay, and inadequate number of viral copies(<138 copies/mL). A negative result must be combined with clinical observations, patient history, and epidemiological information. The expected result is Negative.  Fact Sheet for Patients:  BloggerCourse.com  Fact Sheet for Healthcare Providers:   SeriousBroker.it  This test is no t yet approved or cleared by the [place] FDA and  has been authorized for detection and/or diagnosis of SARS-CoV-2 by FDA under an Emergency Use Authorization (EUA). This EUA will remain  in effect (meaning this test can be used) for the duration of the COVID-19 declaration under Section 564(b)(1) of the Act, 21 U.S.C.section 360bbb-3(b)(1), unless the authorization is terminated  or revoked sooner.       Influenza A by PCR NEGATIVE NEGATIVE   Influenza B by PCR NEGATIVE NEGATIVE    Comment: (NOTE) The Xpert Xpress SARS-CoV-2/FLU/RSV plus assay is intended as an aid in the diagnosis of influenza from Nasopharyngeal swab specimens and should not be used as a sole basis for treatment. Nasal washings and aspirates are unacceptable for Xpert Xpress SARS-CoV-2/FLU/RSV testing.  Fact Sheet for Patients: BloggerCourse.com  Fact Sheet for Healthcare Providers: SeriousBroker.it  This test is not yet approved or cleared by the [place] FDA and has been authorized for detection and/or diagnosis of SARS-CoV-2 by FDA under an Emergency Use Authorization (EUA). This EUA will remain in effect (meaning this test can be used) for the duration of the COVID-19 declaration under Section 564(b)(1) of the Act, 21 U.S.C. section 360bbb-3(b)(1), unless the authorization is terminated or revoked.     Resp Syncytial Virus by PCR NEGATIVE NEGATIVE    Comment: (NOTE) Fact Sheet for Patients: BloggerCourse.com  Fact Sheet for Healthcare Providers: SeriousBroker.it  This test is not yet approved or cleared by the [place] FDA and has been authorized for detection and/or diagnosis of SARS-CoV-2 by FDA under an Emergency Use Authorization (EUA). This EUA will remain in effect (meaning this test can be used) for the duration of  the COVID-19 declaration under Section 564(b)(1) of the Act, 21 U.S.C. section 360bbb-3(b)(1), unless the authorization is terminated or revoked.  Performed at [facility], [address] [place], [place] [address]   Troponin I (High Sensitivity)     Status: None   Collection Time: 02/27/23 10:00 PM  Result Value Ref Range   Troponin I (High Sensitivity) 11 <18 ng/L    Comment: (NOTE) Elevated high sensitivity troponin I (hsTnI) values and significant  changes across serial measurements may suggest ACS but many other  chronic and acute conditions are known to elevate hsTnI results.  Refer to the "Links" section for chest pain algorithms and additional  guidance. Performed at [facility], [address], [place], [place] [address]   Procalcitonin     Status: None   Collection Time: 02/27/23 10:00 PM  Result Value Ref Range   Procalcitonin 15.97 ng/mL    Comment:        Interpretation: PCT >= 10 ng/mL: Important systemic inflammatory response, almost exclusively due to severe bacterial sepsis or septic shock. (NOTE)       Sepsis PCT Algorithm           Lower Respiratory Tract                                      Infection PCT Algorithm    ----------------------------     ----------------------------         PCT < 0.25 ng/mL                PCT < 0.10 ng/mL          Strongly encourage             Strongly discourage   discontinuation of antibiotics    initiation of antibiotics    ----------------------------     -----------------------------       PCT 0.25 - 0.50 ng/mL            PCT 0.10 - 0.25 ng/mL               OR       >80% decrease in PCT            Discourage initiation of                                            antibiotics      Encourage discontinuation           of antibiotics    ----------------------------     -----------------------------         PCT >= 0.50 ng/mL              PCT 0.26 - 0.50 ng/mL                 AND       <80% decrease in PCT             Encourage initiation of                                             antibiotics       Encourage continuation           of antibiotics    ----------------------------     -----------------------------        PCT >= 0.50 ng/mL                  PCT > 0.50 ng/mL  AND         increase in PCT                  Strongly encourage                                      initiation of antibiotics    Strongly encourage escalation           of antibiotics                                     -----------------------------                                           PCT <= 0.25 ng/mL                                                 OR                                        > 80% decrease in PCT                                      Discontinue / Do not initiate                                             antibiotics  Performed at [facility], [address], [place], [place] [address]   I-Stat Lactic Acid, ED     Status: Abnormal   Collection Time: 02/27/23 10:13 PM  Result Value Ref Range   Lactic Acid, Venous 2.2 (HH) 0.5 - 1.9 mmol/L   Comment NOTIFIED PHYSICIAN   Urine rapid drug screen (hosp performed)     Status: None   Collection Time: 02/27/23 10:46 PM  Result Value Ref Range   Opiates NONE DETECTED NONE DETECTED   Cocaine NONE DETECTED NONE DETECTED   Benzodiazepines NONE DETECTED NONE DETECTED   Amphetamines NONE DETECTED NONE DETECTED   Tetrahydrocannabinol NONE DETECTED NONE DETECTED   Barbiturates NONE DETECTED NONE DETECTED    Comment: (NOTE) DRUG SCREEN FOR MEDICAL PURPOSES ONLY.  IF CONFIRMATION IS NEEDED FOR ANY PURPOSE, NOTIFY LAB WITHIN 5 DAYS.  LOWEST DETECTABLE LIMITS FOR URINE DRUG SCREEN Drug Class                     Cutoff (ng/mL) Amphetamine and metabolites    1000 Barbiturate and metabolites    200 Benzodiazepine                 200 Opiates and metabolites        300 Cocaine and  metabolites        300 THC  50 Performed at [facility], [address], [place], [place] [address]   Urinalysis, Routine w reflex microscopic -Urine, Catheterized     Status: Abnormal   Collection Time: 02/27/23 10:46 PM  Result Value Ref Range   Color, Urine YELLOW YELLOW   APPearance CLEAR CLEAR   Specific Gravity, Urine <1.005 (L) 1.005 - 1.030   pH 5.0 5.0 - 8.0   Glucose, UA NEGATIVE NEGATIVE mg/dL   Hgb urine dipstick NEGATIVE NEGATIVE   Bilirubin Urine NEGATIVE NEGATIVE   Ketones, ur 20 (A) NEGATIVE mg/dL   Protein, ur NEGATIVE NEGATIVE mg/dL   Nitrite NEGATIVE NEGATIVE   Leukocytes,Ua SMALL (A) NEGATIVE   RBC / HPF 0-5 0 - 5 RBC/hpf   WBC, UA 0-5 0 - 5 WBC/hpf   Bacteria, UA NONE SEEN NONE SEEN   Squamous Epithelial / HPF 0-5 0 - 5 /HPF    Comment: Performed at [facility], [address] [place], [place] [address]  Comprehensive metabolic panel     Status: Abnormal   Collection Time: 02/28/23  8:48 AM  Result Value Ref Range   Sodium 137 135 - 145 mmol/L   Potassium 4.0 3.5 - 5.1 mmol/L    Comment: HEMOLYSIS AT THIS LEVEL MAY AFFECT RESULT   Chloride 105 98 - 111 mmol/L   CO2 23 22 - 32 mmol/L   Glucose, Bld 112 (H) 70 - 99 mg/dL    Comment: Glucose reference range applies only to samples taken after fasting for at least 8 hours.   BUN 17 8 - 23 mg/dL   Creatinine, Ser 9.62 0.44 - 1.00 mg/dL   Calcium 8.1 (L) 8.9 - 10.3 mg/dL   Total Protein 6.0 (L) 6.5 - 8.1 g/dL   Albumin 2.8 (L) 3.5 - 5.0 g/dL   AST 33 15 - 41 U/L    Comment: HEMOLYSIS AT THIS LEVEL MAY AFFECT RESULT   ALT 32 0 - 44 U/L    Comment: HEMOLYSIS AT THIS LEVEL MAY AFFECT RESULT   Alkaline Phosphatase 63 38 - 126 U/L   Total Bilirubin 1.2 0.3 - 1.2 mg/dL    Comment: HEMOLYSIS AT THIS LEVEL MAY AFFECT RESULT   GFR, Estimated >60 >60 mL/min    Comment: (NOTE) Calculated using the CKD-EPI Creatinine Equation (2021)    Anion gap 9  5 - 15    Comment: Performed at [facility], [address], [place], [place] [address]  CBC with Differential/Platelet     Status: Abnormal   Collection Time: 02/28/23  8:48 AM  Result Value Ref Range   WBC 20.4 (H) 4.0 - 10.5 K/uL   RBC 3.85 (L) 3.87 - 5.11 MIL/uL   Hemoglobin 11.3 (L) 12.0 - 15.0 g/dL   HCT 13.2 44.0 - 10.2 %   MCV 93.8 80.0 - 100.0 fL   MCH 29.4 26.0 - 34.0 pg   MCHC 31.3 30.0 - 36.0 g/dL   RDW 72.5 36.6 - 44.0 %   Platelets 214 150 - 400 K/uL   nRBC 0.0 0.0 - 0.2 %   Neutrophils Relative % 82 %   Neutro Abs 17.0 (H) 1.7 - 7.7 K/uL   Lymphocytes Relative 8 %   Lymphs Abs 1.6 0.7 - 4.0 K/uL   Monocytes Relative 8 %   Monocytes Absolute 1.6 (H) 0.1 - 1.0 K/uL   Eosinophils Relative 1 %   Eosinophils Absolute 0.1 0.0 - 0.5 K/uL   Basophils Relative 0 %  Basophils Absolute 0.1 0.0 - 0.1 K/uL   Immature Granulocytes 1 %   Abs Immature Granulocytes 0.10 (H) 0.00 - 0.07 K/uL    Comment: Performed at [facility], [address], [place], [place] [address]  Procalcitonin     Status: None   Collection Time: 02/28/23  8:48 AM  Result Value Ref Range   Procalcitonin 40.88 ng/mL    Comment:        Interpretation: PCT >= 10 ng/mL: Important systemic inflammatory response, almost exclusively due to severe bacterial sepsis or septic shock. (NOTE)       Sepsis PCT Algorithm           Lower Respiratory Tract                                      Infection PCT Algorithm    ----------------------------     ----------------------------         PCT < 0.25 ng/mL                PCT < 0.10 ng/mL          Strongly encourage             Strongly discourage   discontinuation of antibiotics    initiation of antibiotics    ----------------------------     -----------------------------       PCT 0.25 - 0.50 ng/mL            PCT 0.10 - 0.25 ng/mL               OR       >80% decrease in PCT            Discourage initiation of                                             antibiotics      Encourage discontinuation           of antibiotics    ----------------------------     -----------------------------         PCT >= 0.50 ng/mL              PCT 0.26 - 0.50 ng/mL                AND       <80% decrease in PCT             Encourage initiation of                                             antibiotics       Encourage continuation           of antibiotics    ----------------------------     -----------------------------        PCT >= 0.50 ng/mL                  PCT > 0.50 ng/mL               AND         increase in PCT  Strongly encourage                                      initiation of antibiotics    Strongly encourage escalation           of antibiotics                                     -----------------------------                                           PCT <= 0.25 ng/mL                                                 OR                                        > 80% decrease in PCT                                      Discontinue / Do not initiate                                             antibiotics  Performed at [facility] [facility], [address], [place], [place] [address]   Hemoglobin A1c     Status: Abnormal   Collection Time: 02/28/23  8:48 AM  Result Value Ref Range   Hgb A1c MFr Bld 6.4 (H) 4.8 - 5.6 %    Comment: (NOTE) Pre diabetes:          5.7%-6.4%  Diabetes:              >6.4%  Glycemic control for   <7.0% adults with diabetes    Mean Plasma Glucose 136.98 mg/dL    Comment: Performed at [facility], [address], [place], [place] [address]  CBG monitoring, ED     Status: Abnormal   Collection Time: 02/28/23  9:29 AM  Result Value Ref Range   Glucose-Capillary 105 (H) 70 - 99 mg/dL    Comment: Glucose reference range applies only to samples taken after fasting for at least 8 hours.  CBG monitoring, ED     Status: Abnormal   Collection Time: 02/28/23  12:40 PM  Result Value Ref Range   Glucose-Capillary 119 (H) 70 - 99 mg/dL    Comment: Glucose reference range applies only to samples taken after fasting for at least 8 hours.   CT ABDOMEN PELVIS W CONTRAST  Result Date: 02/27/2023 CLINICAL DATA:  Left arm weakness, diverticulitis, sepsis, shortness of breath EXAM: CT ANGIOGRAPHY CHEST CT ABDOMEN AND PELVIS WITH CONTRAST TECHNIQUE: Multidetector CT imaging of the chest was performed using the standard protocol during bolus administration of intravenous contrast. Multiplanar CT image reconstructions and MIPs were  obtained to evaluate the vascular anatomy. Multidetector CT imaging of the abdomen and pelvis was performed using the standard protocol during bolus administration of intravenous contrast. RADIATION DOSE REDUCTION: This exam was performed according to the departmental dose-optimization program which includes automated exposure control, adjustment of the mA and/or kV according to patient size and/or use of iterative reconstruction technique. CONTRAST:  OMNIPAQUE IOHEXOL 350 MG/ML SOLN COMPARISON:  02/27/2023, 05/02/2022 FINDINGS: CTA CHEST FINDINGS Cardiovascular: This is a technically adequate evaluation of the pulmonary vasculature. No filling defects or pulmonary emboli. Mild cardiomegaly without pericardial effusion. No evidence of thoracic aortic aneurysm or dissection. Atherosclerosis of the aorta and coronary vasculature. Mediastinum/Nodes: Heterogeneous asymmetric enlargement of the left lobe thyroid. Segmental areas soft g is noted, nonspecific. No esophageal wall thickening. Trachea is unremarkable. No pathologic adenopathy. Lungs/Pleura: No acute airspace disease, effusion, or pneumothorax. Central airways are patent. Musculoskeletal: No acute or destructive bony abnormalities. Severe degenerative changes of the bilateral shoulders. Progressive severe spondylosis at the thoracolumbar junction, with degenerative anterolisthesis of T11  relative T12. Prior T12 laminectomy and T11 laminotomy. Reconstructed images demonstrate no additional findings. Review of the MIP images confirms the above findings. CT ABDOMEN and PELVIS FINDINGS Hepatobiliary: Coarse benign appearing calcifications are seen within the ventral aspect right lobe liver. No other focal liver abnormalities. Gallbladder is unremarkable, with expected postsurgical dilatation of the common bile duct. Pancreas: Unremarkable. No pancreatic ductal dilatation or surrounding inflammatory changes. Spleen: Normal in size without focal abnormality. Adrenals/Urinary Tract: Kidneys are unremarkable, with no urinary tract calculi or obstructive uropathy. The adrenals are stable. Bladder is decompressed with a suprapubic catheter, with chronic nonspecific bladder wall thickening. Stomach/Bowel: Loop colostomy within the left mid abdomen. No bowel obstruction or ileus. Colonic diverticulosis without evidence of acute diverticulitis. No bowel wall thickening or inflammatory change. Vascular/Lymphatic: Aortic atherosclerosis. No enlarged abdominal or pelvic lymph nodes. Reproductive: Status post hysterectomy. No adnexal masses. Other: No free fluid or free intraperitoneal gas. Musculoskeletal: Decubitus ulcers are seen within the mid sacral region and overlying the left ischium. There is evidence of chronic osteomyelitis of the sacrum. I do not see any bony destruction at the left ischium. Chronic posttraumatic and postsurgical changes of the bilateral hips. No acute displaced fracture. Reconstructed images demonstrate no additional findings. Review of the MIP images confirms the above findings. IMPRESSION: Chest: 1. No evidence of pulmonary embolus. 2. Cardiomegaly and coronary artery atherosclerosis. 3.  Aortic Atherosclerosis (ICD10-I70.0). Abdomen/pelvis: 1. No acute intra-abdominal or intrapelvic process. 2. Colonic diverticulosis without diverticulitis. 3. Loop colostomy left mid abdomen.  No  bowel obstruction or ileus. 4. Decubitus ulcers overlying the sacrum and left ischium. Evidence of chronic osteomyelitis of the distal sacrum unchanged. 5. Chronic indwelling suprapubic catheter, with nonspecific bladder wall thickening. 6.  Aortic Atherosclerosis (ICD10-I70.0). Electronically Signed   By: Sharlet Salina M.D.   On: 02/27/2023 22:55   CT Angio Chest PE W and/or Wo Contrast  Result Date: 02/27/2023 CLINICAL DATA:  Left arm weakness, diverticulitis, sepsis, shortness of breath EXAM: CT ANGIOGRAPHY CHEST CT ABDOMEN AND PELVIS WITH CONTRAST TECHNIQUE: Multidetector CT imaging of the chest was performed using the standard protocol during bolus administration of intravenous contrast. Multiplanar CT image reconstructions and MIPs were obtained to evaluate the vascular anatomy. Multidetector CT imaging of the abdomen and pelvis was performed using the standard protocol during bolus administration of intravenous contrast. RADIATION DOSE REDUCTION: This exam was performed according to the departmental dose-optimization program which includes automated exposure control, adjustment of the mA  and/or kV according to patient size and/or use of iterative reconstruction technique. CONTRAST:  OMNIPAQUE IOHEXOL 350 MG/ML SOLN COMPARISON:  02/27/2023, 05/02/2022 FINDINGS: CTA CHEST FINDINGS Cardiovascular: This is a technically adequate evaluation of the pulmonary vasculature. No filling defects or pulmonary emboli. Mild cardiomegaly without pericardial effusion. No evidence of thoracic aortic aneurysm or dissection. Atherosclerosis of the aorta and coronary vasculature. Mediastinum/Nodes: Heterogeneous asymmetric enlargement of the left lobe thyroid. Segmental areas soft g is noted, nonspecific. No esophageal wall thickening. Trachea is unremarkable. No pathologic adenopathy. Lungs/Pleura: No acute airspace disease, effusion, or pneumothorax. Central airways are patent. Musculoskeletal: No acute or  destructive bony abnormalities. Severe degenerative changes of the bilateral shoulders. Progressive severe spondylosis at the thoracolumbar junction, with degenerative anterolisthesis of T11 relative T12. Prior T12 laminectomy and T11 laminotomy. Reconstructed images demonstrate no additional findings. Review of the MIP images confirms the above findings. CT ABDOMEN and PELVIS FINDINGS Hepatobiliary: Coarse benign appearing calcifications are seen within the ventral aspect right lobe liver. No other focal liver abnormalities. Gallbladder is unremarkable, with expected postsurgical dilatation of the common bile duct. Pancreas: Unremarkable. No pancreatic ductal dilatation or surrounding inflammatory changes. Spleen: Normal in size without focal abnormality. Adrenals/Urinary Tract: Kidneys are unremarkable, with no urinary tract calculi or obstructive uropathy. The adrenals are stable. Bladder is decompressed with a suprapubic catheter, with chronic nonspecific bladder wall thickening. Stomach/Bowel: Loop colostomy within the left mid abdomen. No bowel obstruction or ileus. Colonic diverticulosis without evidence of acute diverticulitis. No bowel wall thickening or inflammatory change. Vascular/Lymphatic: Aortic atherosclerosis. No enlarged abdominal or pelvic lymph nodes. Reproductive: Status post hysterectomy. No adnexal masses. Other: No free fluid or free intraperitoneal gas. Musculoskeletal: Decubitus ulcers are seen within the mid sacral region and overlying the left ischium. There is evidence of chronic osteomyelitis of the sacrum. I do not see any bony destruction at the left ischium. Chronic posttraumatic and postsurgical changes of the bilateral hips. No acute displaced fracture. Reconstructed images demonstrate no additional findings. Review of the MIP images confirms the above findings. IMPRESSION: Chest: 1. No evidence of pulmonary embolus. 2. Cardiomegaly and coronary artery atherosclerosis. 3.  Aortic  Atherosclerosis (ICD10-I70.0). Abdomen/pelvis: 1. No acute intra-abdominal or intrapelvic process. 2. Colonic diverticulosis without diverticulitis. 3. Loop colostomy left mid abdomen.  No bowel obstruction or ileus. 4. Decubitus ulcers overlying the sacrum and left ischium. Evidence of chronic osteomyelitis of the distal sacrum unchanged. 5. Chronic indwelling suprapubic catheter, with nonspecific bladder wall thickening. 6.  Aortic Atherosclerosis (ICD10-I70.0). Electronically Signed   By: Sharlet Salina M.D.   On: 02/27/2023 22:55   CT HEAD WO CONTRAST  Result Date: 02/27/2023 CLINICAL DATA:  Neuro deficit, acute, stroke suspected EXAM: CT HEAD WITHOUT CONTRAST TECHNIQUE: Contiguous axial images were obtained from the base of the skull through the vertex without intravenous contrast. RADIATION DOSE REDUCTION: This exam was performed according to the departmental dose-optimization program which includes automated exposure control, adjustment of the mA and/or kV according to patient size and/or use of iterative reconstruction technique. COMPARISON:  Brain MR 03/09/21 FINDINGS: Brain: No evidence of acute infarction, hemorrhage, hydrocephalus, extra-axial collection or mass lesion/mass effect. Extensive periventricular hypodensity likely represents sequela multiple sclerosis. Vascular: No hyperdense vessel or unexpected calcification. Skull: Normal. Negative for fracture or focal lesion. Sinuses/Orbits: No middle ear or mastoid effusion. Paranasal sinuses are notable for complete opacification of the right sphenoid sinus with osseous changes suggestive of chronic right sphenoid sinusitis. Other: None. IMPRESSION: No acute intracranial abnormality Electronically Signed  By: Lorenza Cambridge M.D.   On: 02/27/2023 20:43   DG Chest Port 1 View  Result Date: 02/27/2023 CLINICAL DATA:  Sepsis. Left arm weakness and leaning. Shortness of breath. EXAM: PORTABLE CHEST 1 VIEW COMPARISON:  04/05/2021 FINDINGS: Patient  rotation limits examination. Shallow inspiration. Heart size and pulmonary vascularity are probably normal for technique. No pleural effusions. No pneumothorax. Mediastinal contours appear intact. IMPRESSION: Shallow inspiration.  No evidence of active pulmonary disease. Electronically Signed   By: Burman Nieves M.D.   On: 02/27/2023 20:22    Pending Labs Unresulted Labs (From admission, onward)     Start     Ordered   02/27/23 2006  Urine Culture  Once,   URGENT       Question:  Indication  Answer:  Dysuria   02/27/23 2005            Vitals/Pain Today's Vitals   02/28/23 1000 02/28/23 1030 02/28/23 1100 02/28/23 1130  BP: 132/69 123/69 115/63 106/62  Pulse: 97 94 98 97  Resp: (!) 25 16 17 17   Temp:      TempSrc:      SpO2: 95% 100% 99% 99%  Weight:      Height:      PainSc:        Isolation Precautions Droplet precaution  Medications Medications  0.9 %  sodium chloride infusion ( Intravenous Started During Downtime 02/28/23 0843)  metoprolol succinate (TOPROL-XL) 24 hr tablet 50 mg (50 mg Oral Given 02/28/23 0844)  Dimethyl Fumarate CPDR 240 mg (240 mg Oral Not Given 02/28/23 1149)  DULoxetine (CYMBALTA) DR capsule 90 mg (90 mg Oral Given 02/28/23 0934)  methimazole (TAPAZOLE) tablet 5 mg (5 mg Oral Given 02/28/23 0706)  predniSONE (DELTASONE) tablet 2.5 mg (has no administration in time range)  predniSONE (DELTASONE) tablet 5 mg (5 mg Oral Given 02/28/23 0934)  pantoprazole (PROTONIX) EC tablet 40 mg (40 mg Oral Given 02/28/23 0705)  fesoterodine (TOVIAZ) tablet 4 mg (4 mg Oral Given 02/28/23 1152)  magnesium oxide (MAG-OX) tablet 400 mg (400 mg Oral Given 02/28/23 0934)  baclofen (LIORESAL) tablet 15 mg (15 mg Oral Given 02/28/23 0933)  gabapentin (NEURONTIN) capsule 200 mg (200 mg Oral Given 02/28/23 0934)  heparin injection 5,000 Units (5,000 Units Subcutaneous Given 02/28/23 0706)  acetaminophen (TYLENOL) tablet 650 mg (has no administration in time range)    Or   acetaminophen (TYLENOL) suppository 650 mg (has no administration in time range)  ondansetron (ZOFRAN) tablet 4 mg (has no administration in time range)    Or  ondansetron (ZOFRAN) injection 4 mg (has no administration in time range)  melatonin tablet 10 mg (has no administration in time range)  insulin aspart (novoLOG) injection 0-9 Units ( Subcutaneous Not Given 02/28/23 1242)  insulin aspart (novoLOG) injection 0-5 Units (has no administration in time range)  baclofen (LIORESAL) tablet 20 mg (has no administration in time range)  baclofen (LIORESAL) tablet 10 mg (has no administration in time range)  ceFEPIme (MAXIPIME) 2 g in sodium chloride 0.9 % 100 mL IVPB (0 g Intravenous Stopped 02/28/23 0914)  vancomycin (VANCOREADY) IVPB 750 mg/150 mL (has no administration in time range)  sodium chloride 0.9 % bolus 1,000 mL (0 mLs Intravenous Stopped 02/27/23 2153)    And  sodium chloride 0.9 % bolus 1,000 mL (0 mLs Intravenous Stopped 02/27/23 2153)    And  sodium chloride 0.9 % bolus 500 mL (0 mLs Intravenous Stopped 02/27/23 2153)  ceFEPIme (MAXIPIME) 2 g in  sodium chloride 0.9 % 100 mL IVPB (0 g Intravenous Stopped 02/27/23 2047)  metroNIDAZOLE (FLAGYL) IVPB 500 mg (0 mg Intravenous Stopped 02/27/23 2153)  acetaminophen (TYLENOL) tablet 650 mg (650 mg Oral Given 02/27/23 2035)  vancomycin (VANCOREADY) IVPB 1500 mg/300 mL (0 mg Intravenous Stopped 02/28/23 0011)  iohexol (OMNIPAQUE) 350 MG/ML injection 100 mL (100 mLs Intravenous Contrast Given 02/27/23 2214)    Mobility non-ambulatory

## 2023-02-28 NOTE — Progress Notes (Signed)
Pharmacy Antibiotic Note  Laura Roberts is a 82 y.o. female admitted on 02/27/2023 with sepsis.  In the ED patient received Vancomycin 1500mg  IV, Cefepime 2gm IV and Metronidazole 500mg  IV x 1 dose each.  Pharmacy has been consulted for Vancomycin and Cefepime dosing.  Plan: Cefepime 2gm IV q12h Vancomycin 750 mg IV Q 24 hrs. Goal AUC 400-550.  Expected AUC: 456  SCr used: 0.8 (rounded up from 0.6) Follow renal function F/u culture results and sensitivities  Height: 5\' 2"  (157.5 cm) Weight: 79.8 kg (176 lb) IBW/kg (Calculated) : 50.1  Temp (24hrs), Avg:100.2 F (37.9 C), Min:98.7 F (37.1 C), Max:102.4 F (39.1 C)  Recent Labs  Lab 02/27/23 2000 02/27/23 2014 02/27/23 2034 02/27/23 2213  WBC 14.5*  --   --   --   CREATININE 0.55 0.60  --   --   LATICACIDVEN  --   --  2.4* 2.2*    Estimated Creatinine Clearance: 54.9 mL/min (by C-G formula based on SCr of 0.6 mg/dL).    Allergies  Allergen Reactions   Codeine Shortness Of Breath and Other (See Comments)    Difficult breathing and skin problem   Ultram [Tramadol] Shortness Of Breath and Other (See Comments)    Difficult breathing and skin peeling   Januvia [Sitagliptin] Rash and Other (See Comments)    Blisters    Antimicrobials this admission: 8/29 Metronidazole x 1 dose  8/29 Cefepime >>   8/29 Vancomycin >>  Dose adjustments this admission:    Microbiology results: 8/29 BCx:   8/29 UCx:       Thank you for allowing pharmacy to be a part of this patient's care.  Maryellen Pile, PharmD 02/28/2023 6:02 AM

## 2023-02-28 NOTE — Assessment & Plan Note (Signed)
Stable. Add SSI.

## 2023-02-28 NOTE — Assessment & Plan Note (Signed)
Stable. Doubt this is the source of her fever/tachycardia.

## 2023-02-28 NOTE — Assessment & Plan Note (Signed)
Stable. Continue with toprol-xl. Hold lasix while pt is febrile.

## 2023-02-28 NOTE — Assessment & Plan Note (Addendum)
Observation med/tele bed. Pt with fever and tachycardia. CTPA negative for PE. CT abd shows chronic osteo of sacral. Pt's decub ulcers are healing well and without any drainage. Pt given IV cefepime and vanco. Covid is negative. Do not have a source for infection but pt is still febrile and tachycardic. Will continue to monitor off abx. Check procalcitonin. Will add RVP.

## 2023-02-28 NOTE — ED Notes (Addendum)
On assessment, pt verbalized that she has no pain at this time

## 2023-02-28 NOTE — Assessment & Plan Note (Addendum)
Chronic. Presents on admission. See pictures. Wounds without any drainage. RN reports that family said that pt's wounds are healing nicely.

## 2023-02-28 NOTE — Assessment & Plan Note (Signed)
Reviewed DNR and most form

## 2023-02-28 NOTE — Subjective & Objective (Signed)
CC: left arm weakness.  HPI: 82 year old African-American female history of multiple sclerosis with now paraplegia, bedbound status, history of hypertension, type 2 diabetes on long-term insulin, chronic sacral decubitus ulcers present on admission, chronic sacral osteomyelitis present on admission, history of colostomy, history of chronic suprapubic catheter, history of chronic steroid use who presents to the ER with reported left-sided weakness.  Unclear how this was interpreted as a stroke as the patient always has left-sided weakness.  Patient lives at [facility] nursing home which is right next door to [facility].  Posey Rea why, EMS was called to the nursing home and the patient was brought to [facility] instead.  On arrival temp 102.4 heart rate 149 blood pressure 97/60.  Patient states that her left side of her body is chronically weak secondary to her MS. She says that she has been staying in her room at the nursing home Due to an outbreak of COVID there.  She denies any cough symptoms.  She has not had any diarrhea in her colostomy.  After getting some Tylenol she states she feels better.  Initial lactic acid of 2.4  White count 14.5, hemoglobin 13.7, platelets of 228, 94% neutrophils  Sodium 137, potassium 4.4, chloride 97, bicarb of 23, BUN of 26, creatinine 0.55, glucose of 198 Total protein 7.4, albumin 3.8, AST 58, ALT of 35, alk phos 80, total bili 1.6  COVID was negative, influenza negative, RSV negative  UDS was negative  UA showed specific gravity less than 1.005.  Ketones 20 small leukocyte esterase no bacteria  Chest x-ray showed no acute cardiopulmonary disease  Head CT was negative for acute infarct  CTPA and CT chest were negative for PE.  There is colonic diverticulosis without diverticulitis.  There is no acute intra-abdominal process.  She has chronic osteomyelitis of the distal sacrum that is unchanged compared to her films from August 2024  and November 2023.  Patient was given IV cefepime, vancomycin and Flagyl.  Blood cultures were obtained.  [facility] consulted.

## 2023-02-28 NOTE — Progress Notes (Signed)
   Procalcitonin is 15.97 RVP is negative. Unclear where the source of infection is. Will continue cefepime and vanco for now.  Carollee Herter, DO [facility]

## 2023-03-01 DIAGNOSIS — R6511 Systemic inflammatory response syndrome (SIRS) of non-infectious origin with acute organ dysfunction: Secondary | ICD-10-CM | POA: Diagnosis not present

## 2023-03-01 LAB — GLUCOSE, CAPILLARY
Glucose-Capillary: 108 mg/dL — ABNORMAL HIGH (ref 70–99)
Glucose-Capillary: 167 mg/dL — ABNORMAL HIGH (ref 70–99)
Glucose-Capillary: 146 mg/dL — ABNORMAL HIGH (ref 70–99)
Glucose-Capillary: 122 mg/dL — ABNORMAL HIGH (ref 70–99)

## 2023-03-01 LAB — CBC
HCT: 33.7 % — ABNORMAL LOW (ref 36.0–46.0)
Hemoglobin: 10.8 g/dL — ABNORMAL LOW (ref 12.0–15.0)
MCH: 30.4 pg (ref 26.0–34.0)
MCHC: 32 g/dL (ref 30.0–36.0)
MCV: 94.9 fL (ref 80.0–100.0)
Platelets: 209 10*3/uL (ref 150–400)
RBC: 3.55 MIL/uL — ABNORMAL LOW (ref 3.87–5.11)
RDW: 14.7 % (ref 11.5–15.5)
WBC: 11.7 10*3/uL — ABNORMAL HIGH (ref 4.0–10.5)
nRBC: 0 % (ref 0.0–0.2)

## 2023-03-01 LAB — BASIC METABOLIC PANEL
Anion gap: 9 (ref 5–15)
BUN: 9 mg/dL (ref 8–23)
CO2: 25 mmol/L (ref 22–32)
Calcium: 8.8 mg/dL — ABNORMAL LOW (ref 8.9–10.3)
Chloride: 106 mmol/L (ref 98–111)
Creatinine, Ser: 0.5 mg/dL (ref 0.44–1.00)
GFR, Estimated: >60 mL/min (ref >60)
Glucose, Bld: 100 mg/dL — ABNORMAL HIGH (ref 70–99)
Potassium: 3.3 mmol/L — ABNORMAL LOW (ref 3.5–5.1)
Sodium: 140 mmol/L (ref 135–145)

## 2023-03-01 LAB — PHOSPHORUS: Phosphorus: 2.6 mg/dL (ref 2.5–4.6)

## 2023-03-01 LAB — MAGNESIUM: Magnesium: 1.9 mg/dL (ref 1.7–2.4)

## 2023-03-01 MED ORDER — POTASSIUM CHLORIDE 20 MEQ PO PACK
40.0000 meq | PACK | Freq: Once | ORAL | Status: AC
  Administered 2023-03-01: 40 meq via ORAL
  Filled 2023-03-01: qty 2

## 2023-03-01 NOTE — Progress Notes (Signed)
PROGRESS NOTE    Laura Roberts  WUJ:[id number] DOB: 07/14/1940 DOA: 02/27/2023 PCP: Marinda Elk, MD   Brief Narrative:  This 82 years old female with PMH significant of multiple sclerosis with paraplegia, bedbound status, history of hypertension, type 2 diabetes on insulin, chronic sacral decubitus ulcer POA, chronic sacral osteomyelitis POA, history of colostomy, history of chronic suprapubic catheter, history of chronic steroid use presented in the ED with left-sided weakness.  EMS was called for patient having left-sided weakness interpreted as a stroke.  Patient has left-sided weakness from multiple sclerosis.  Workup in the ED.  CT head negative for acute infarct, CT chest ruled out pulmonary embolism.  CT abdomen and pelvis no acute intra-abdominal or intrapelvic abnormalities noted.  Patient is admitted for sepsis with unknown cause.  Patient does have chronic decubitus ulcer which does not appear infected.  Patient started on empiric antibiotics.  Procalcitonin is 40.1. Patient seems improved.   Assessment & Plan:   Principal Problem:   SIRS of non-infectious origin w acute organ dysfunction (HCC) Active Problems:   Multiple sclerosis (HCC)   Essential hypertension   Paraplegia (HCC)   Type 2 diabetes mellitus with diabetic neuropathy, with long-term current use of insulin (HCC)   Chronic suprapubic catheter (HCC)   Decubitus ulcer   Chronic osteomyelitis (HCC)   S/P colostomy (HCC)   DNR (do not resuscitate)/DNI(Do not intubate)  Sepsis of unknown etiology: Patient presented with fever and tachycardia, lactic acid 2.4, procalcitonin 40.1. CTA negative for PE.  CT abd shows chronic osteomyelitis of sacral.  Pt's decub ulcers are healing well and without any drainage.  Initiated on empiric antibiotics ( IV cefepime and vanco).  Covid is negative.  Do not have a source for infection but pt is still febrile and tachycardic.  Procalcitonin 40.1.  Continue vancomycin and  cefepime. Follow-up blood and urine cultures.   S/P colostomy (HCC) Chronic.Continue  colostomy care.   Chronic osteomyelitis (HCC) Stable. Doubt this is the source of her fever/tachycardia.   Decubitus ulcer: Chronic. Presents on admission.Wounds without any drainage.  RN reports that family said that pt's wounds are healing nicely.  Chronic suprapubic catheter (HCC) Chronic. UA negative.   Type 2 diabetes with diabetic neuropathy: Stable.  Continue regular insulin sliding scale   Paraplegia (HCC) Chronic. Due to MS.   Essential hypertension Stable. Continue with toprol-xl. Hold lasix while pt is febrile.   Multiple sclerosis (HCC) Chronic. Follows with neurology. She is  bedbound.   DNR (do not resuscitate)/DNI(Do not intubate) Reviewed DNR and most form.  DVT prophylaxis: Heparin sq Code Status: DNR Family Communication: No family at bed side Disposition Plan:    Status is: Inpatient Remains inpatient appropriate because: Admitted for sepsis of unknown etiology, continued on IV antibiotics and awaiting cultures.   Consultants:  None  Procedures:  Antimicrobials:  Anti-infectives (From admission, onward)    Start     Dose/Rate Route Frequency Ordered Stop   02/28/23 2100  vancomycin (VANCOREADY) IVPB 750 mg/150 mL        750 mg 150 mL/hr over 60 Minutes Intravenous Every 24 hours 02/28/23 0610     02/28/23 0800  ceFEPIme (MAXIPIME) 2 g in sodium chloride 0.9 % 100 mL IVPB        2 g 200 mL/hr over 30 Minutes Intravenous Every 12 hours 02/28/23 0610     02/27/23 2015  ceFEPIme (MAXIPIME) 2 g in sodium chloride 0.9 % 100 mL IVPB  2 g 200 mL/hr over 30 Minutes Intravenous  Once 02/27/23 2005 02/27/23 2047   02/27/23 2015  metroNIDAZOLE (FLAGYL) IVPB 500 mg        500 mg 100 mL/hr over 60 Minutes Intravenous  Once 02/27/23 2005 02/27/23 2153   02/27/23 2015  vancomycin (VANCOCIN) IVPB 1000 mg/200 mL premix  Status:  Discontinued        1,000 mg 200  mL/hr over 60 Minutes Intravenous  Once 02/27/23 2005 02/27/23 2010   02/27/23 2015  vancomycin (VANCOREADY) IVPB 1500 mg/300 mL        1,500 mg 150 mL/hr over 120 Minutes Intravenous  Once 02/27/23 2011 02/28/23 0011      Subjective: Patient was seen and examined at bedside.  Overnight events noted.   Patient reports doing better. She remains afebrile since she is here.   Denies any other concerns.  Objective: Vitals:   02/28/23 2044 03/01/23 0009 03/01/23 0500 03/01/23 0539  BP: 108/66 (!) 128/56  124/61  Pulse: 100 92  85  Resp: 18 18  18   Temp: 97.8 F (36.6 C) 97.8 F (36.6 C)  98 F (36.7 C)  TempSrc: Oral Oral  Oral  SpO2: 100% 100%  100%  Weight:   79.8 kg   Height:        Intake/Output Summary (Last 24 hours) at 03/01/2023 1309 Last data filed at 03/01/2023 0754 Gross per 24 hour  Intake 1426.19 ml  Output 2650 ml  Net -1223.81 ml   Filed Weights   02/27/23 1940 03/01/23 0500  Weight: 79.8 kg 79.8 kg    Examination:  General exam: Appears calm and comfortable, deconditioned, not in any acute distress. Respiratory system: Clear to auscultation. Respiratory effort normal.  RR 15 Cardiovascular system: S1 & S2 heard, RRR. No JVD, murmurs, rubs, gallops or clicks. No pedal edema. Gastrointestinal system: Abdomen is nondistended, soft and nontender. Normal bowel sounds heard. Central nervous system: Alert and oriented x 3. No focal neurological deficits. Extremities: No edema, no cyanosis, no clubbing. Skin: Chronic sacral decubitus ulcer healing. Psychiatry: Judgement and insight appear normal. Mood & affect appropriate.     Data Reviewed: I have personally reviewed following labs and imaging studies  CBC: Recent Labs  Lab 02/27/23 2000 02/27/23 2014 02/28/23 0848 03/01/23 0643  WBC 14.5*  --  20.4* 11.7*  NEUTROABS 13.7*  --  17.0*  --   HGB 13.7 14.6 11.3* 10.8*  HCT 43.0 43.0 36.1 33.7*  MCV 92.9  --  93.8 94.9  PLT 228  --  214 209   Basic  Metabolic Panel: Recent Labs  Lab 02/27/23 2000 02/27/23 2014 02/28/23 0848 03/01/23 0643  NA 137 137 137 140  K 4.4 4.1 4.0 3.3*  CL 97* 100 105 106  CO2 23  --  23 25  GLUCOSE 198* 208* 112* 100*  BUN 26* 27* 17 9  CREATININE 0.55 0.60 0.49 0.50  CALCIUM 9.4  --  8.1* 8.8*  MG  --   --   --  1.9  PHOS  --   --   --  2.6   GFR: Estimated Creatinine Clearance: 54.9 mL/min (by C-G formula based on SCr of 0.5 mg/dL). Liver Function Tests: Recent Labs  Lab 02/27/23 2000 02/28/23 0848  AST 58* 33  ALT 35 32  ALKPHOS 80 63  BILITOT 1.6* 1.2  PROT 7.4 6.0*  ALBUMIN 3.8 2.8*   No results for input(s): "LIPASE", "AMYLASE" in the last 168 hours. No results  for input(s): "AMMONIA" in the last 168 hours. Coagulation Profile: Recent Labs  Lab 02/27/23 2000  INR 1.0   Cardiac Enzymes: No results for input(s): "CKTOTAL", "CKMB", "CKMBINDEX", "TROPONINI" in the last 168 hours. BNP (last 3 results) No results for input(s): "PROBNP" in the last 8760 hours. HbA1C: Recent Labs    02/28/23 0848  HGBA1C 6.4*   CBG: Recent Labs  Lab 02/28/23 1240 02/28/23 1854 02/28/23 2040 03/01/23 0747 03/01/23 1203  GLUCAP 119* 154* 196* 108* 167*   Lipid Profile: No results for input(s): "CHOL", "HDL", "LDLCALC", "TRIG", "CHOLHDL", "LDLDIRECT" in the last 72 hours. Thyroid Function Tests: No results for input(s): "TSH", "T4TOTAL", "FREET4", "T3FREE", "THYROIDAB" in the last 72 hours. Anemia Panel: No results for input(s): "VITAMINB12", "FOLATE", "FERRITIN", "TIBC", "IRON", "RETICCTPCT" in the last 72 hours. Sepsis Labs: Recent Labs  Lab 02/27/23 2034 02/27/23 2200 02/27/23 2213 02/28/23 0848  PROCALCITON  --  15.97  --  40.88  LATICACIDVEN 2.4*  --  2.2*  --     Recent Results (from the past 240 hour(s))  Respiratory (~20 pathogens) panel by PCR     Status: None   Collection Time: 02/27/23  1:06 AM   Specimen: Nasopharyngeal Swab; Respiratory  Result Value Ref Range  Status   Adenovirus NOT DETECTED NOT DETECTED Final   Coronavirus 229E NOT DETECTED NOT DETECTED Final    Comment: (NOTE) The Coronavirus on the Respiratory Panel, DOES NOT test for the novel  Coronavirus (2019 nCoV)    Coronavirus HKU1 NOT DETECTED NOT DETECTED Final   Coronavirus NL63 NOT DETECTED NOT DETECTED Final   Coronavirus OC43 NOT DETECTED NOT DETECTED Final   Metapneumovirus NOT DETECTED NOT DETECTED Final   Rhinovirus / Enterovirus NOT DETECTED NOT DETECTED Final   Influenza A NOT DETECTED NOT DETECTED Final   Influenza B NOT DETECTED NOT DETECTED Final   Parainfluenza Virus 1 NOT DETECTED NOT DETECTED Final   Parainfluenza Virus 2 NOT DETECTED NOT DETECTED Final   Parainfluenza Virus 3 NOT DETECTED NOT DETECTED Final   Parainfluenza Virus 4 NOT DETECTED NOT DETECTED Final   Respiratory Syncytial Virus NOT DETECTED NOT DETECTED Final   Bordetella pertussis NOT DETECTED NOT DETECTED Final   Bordetella Parapertussis NOT DETECTED NOT DETECTED Final   Chlamydophila pneumoniae NOT DETECTED NOT DETECTED Final   Mycoplasma pneumoniae NOT DETECTED NOT DETECTED Final    Comment: Performed at [facility], [address], [place], [place] [address]  Blood Culture (routine x 2)     Status: None (Preliminary result)   Collection Time: 02/27/23  8:10 PM   Specimen: BLOOD  Result Value Ref Range Status   Specimen Description   Final    BLOOD BLOOD RIGHT FOREARM Performed at [facility], [address] [place], [place] [address]    Special Requests   Final    BOTTLES DRAWN AEROBIC AND ANAEROBIC Blood Culture adequate volume Performed at [facility], [address] [place], [place] [address]    Culture   Final    NO GROWTH 2 DAYS Performed at [facility], [address], [place], [place] [address]    Report Status PENDING  Incomplete  Blood Culture (routine x 2)     Status: None (Preliminary result)   Collection Time:  02/27/23  8:20 PM   Specimen: BLOOD  Result Value Ref Range Status   Specimen Description   Final    BLOOD RIGHT ANTECUBITAL Performed at [facility],  [address] [place], [place] [address]    Special Requests   Final    BOTTLES DRAWN AEROBIC AND ANAEROBIC Blood Culture results may not be optimal due to an excessive volume of blood received in culture bottles Performed at [facility], [address], [place], [place] [address]    Culture   Final    NO GROWTH 2 DAYS Performed at [facility], [address] [place], [place] [address]    Report Status PENDING  Incomplete  Resp panel by RT-PCR (RSV, Flu A&B, Covid) Anterior Nasal Swab     Status: None   Collection Time: 02/27/23  8:35 PM   Specimen: Anterior Nasal Swab  Result Value Ref Range Status   SARS Coronavirus 2 by RT PCR NEGATIVE NEGATIVE Final    Comment: (NOTE) SARS-CoV-2 target nucleic acids are NOT DETECTED.  The SARS-CoV-2 RNA is generally detectable in upper respiratory specimens during the acute phase of infection. The lowest concentration of SARS-CoV-2 viral copies this assay can detect is 138 copies/mL. A negative result does not preclude SARS-Cov-2 infection and should not be used as the sole basis for treatment or other patient management decisions. A negative result may occur with  improper specimen collection/handling, submission of specimen other than nasopharyngeal swab, presence of viral mutation(s) within the areas targeted by this assay, and inadequate number of viral copies(<138 copies/mL). A negative result must be combined with clinical observations, patient history, and epidemiological information. The expected result is Negative.  Fact Sheet for Patients:  BloggerCourse.com  Fact Sheet for Healthcare Providers:  SeriousBroker.it  This test is no t yet approved or cleared by the [place] FDA  and  has been authorized for detection and/or diagnosis of SARS-CoV-2 by FDA under an Emergency Use Authorization (EUA). This EUA will remain  in effect (meaning this test can be used) for the duration of the COVID-19 declaration under Section 564(b)(1) of the Act, 21 U.S.C.section 360bbb-3(b)(1), unless the authorization is terminated  or revoked sooner.       Influenza A by PCR NEGATIVE NEGATIVE Final   Influenza B by PCR NEGATIVE NEGATIVE Final    Comment: (NOTE) The Xpert Xpress SARS-CoV-2/FLU/RSV plus assay is intended as an aid in the diagnosis of influenza from Nasopharyngeal swab specimens and should not be used as a sole basis for treatment. Nasal washings and aspirates are unacceptable for Xpert Xpress SARS-CoV-2/FLU/RSV testing.  Fact Sheet for Patients: BloggerCourse.com  Fact Sheet for Healthcare Providers: SeriousBroker.it  This test is not yet approved or cleared by the [place] FDA and has been authorized for detection and/or diagnosis of SARS-CoV-2 by FDA under an Emergency Use Authorization (EUA). This EUA will remain in effect (meaning this test can be used) for the duration of the COVID-19 declaration under Section 564(b)(1) of the Act, 21 U.S.C. section 360bbb-3(b)(1), unless the authorization is terminated or revoked.     Resp Syncytial Virus by PCR NEGATIVE NEGATIVE Final    Comment: (NOTE) Fact Sheet for Patients: BloggerCourse.com  Fact Sheet for Healthcare Providers: SeriousBroker.it  This test is not yet approved or cleared by the [place] FDA and has been authorized for detection and/or diagnosis of SARS-CoV-2 by FDA under an Emergency Use Authorization (EUA). This EUA will remain in effect (meaning this test can be used) for the duration of the COVID-19 declaration under Section 564(b)(1) of the Act, 21 U.S.C. section 360bbb-3(b)(1),  unless the authorization is terminated or revoked.  Performed at [facility], [address], [place], [place] [address]   Urine Culture     Status: None (Preliminary result)   Collection Time: 02/27/23 10:46 PM   Specimen: Urine, Catheterized  Result Value Ref Range Status   Specimen Description   Final    URINE, CATHETERIZED Performed at [facility], [address], [place], [place] [address]    Special Requests   Final    NONE Performed at [facility], [address] [place], [place] [address]    Culture   Final    CULTURE REINCUBATED FOR BETTER GROWTH Performed at [facility], [address], [place], [place] [address]    Report Status PENDING  Incomplete         Radiology Studies: CT ABDOMEN PELVIS W CONTRAST  Result Date: 02/27/2023 CLINICAL DATA:  Left arm weakness, diverticulitis, sepsis, shortness of breath EXAM: CT ANGIOGRAPHY CHEST CT ABDOMEN AND PELVIS WITH CONTRAST TECHNIQUE: Multidetector CT imaging of the chest was performed using the standard protocol during bolus administration of intravenous contrast. Multiplanar CT image reconstructions and MIPs were obtained to evaluate the vascular anatomy. Multidetector CT imaging of the abdomen and pelvis was performed using the standard protocol during bolus administration of intravenous contrast. RADIATION DOSE REDUCTION: This exam was performed according to the departmental dose-optimization program which includes automated exposure control, adjustment of the mA and/or kV according to patient size and/or use of iterative reconstruction technique. CONTRAST:  OMNIPAQUE IOHEXOL 350 MG/ML SOLN COMPARISON:  02/27/2023, 05/02/2022 FINDINGS: CTA CHEST FINDINGS Cardiovascular: This is a technically adequate evaluation of the pulmonary vasculature. No filling defects or pulmonary emboli. Mild cardiomegaly without pericardial effusion. No evidence of thoracic  aortic aneurysm or dissection. Atherosclerosis of the aorta and coronary vasculature. Mediastinum/Nodes: Heterogeneous asymmetric enlargement of the left lobe thyroid. Segmental areas soft g is noted, nonspecific. No esophageal wall thickening. Trachea is unremarkable. No pathologic adenopathy. Lungs/Pleura: No acute airspace disease, effusion, or pneumothorax. Central airways are patent. Musculoskeletal: No acute or destructive bony abnormalities. Severe degenerative changes of the bilateral shoulders. Progressive severe spondylosis at the thoracolumbar junction, with degenerative anterolisthesis of T11 relative T12. Prior T12 laminectomy and T11 laminotomy. Reconstructed images demonstrate no additional findings. Review of the MIP images confirms the above findings. CT ABDOMEN and PELVIS FINDINGS Hepatobiliary: Coarse benign appearing calcifications are seen within the ventral aspect right lobe liver. No other focal liver abnormalities. Gallbladder is unremarkable, with expected postsurgical dilatation of the common bile duct. Pancreas: Unremarkable. No pancreatic ductal dilatation or surrounding inflammatory changes. Spleen: Normal in size without focal abnormality. Adrenals/Urinary Tract: Kidneys are unremarkable, with no urinary tract calculi or obstructive uropathy. The adrenals are stable. Bladder is decompressed with a suprapubic catheter, with chronic nonspecific bladder wall thickening. Stomach/Bowel: Loop colostomy within the left mid abdomen. No bowel obstruction or ileus. Colonic diverticulosis without evidence of acute diverticulitis. No bowel wall thickening or inflammatory change. Vascular/Lymphatic: Aortic atherosclerosis. No enlarged abdominal or pelvic lymph nodes. Reproductive: Status post hysterectomy. No adnexal masses. Other: No free fluid or free intraperitoneal gas. Musculoskeletal: Decubitus ulcers are seen within the mid sacral region and overlying the left ischium. There is evidence of  chronic osteomyelitis of the sacrum. I do not see any bony destruction at the left ischium. Chronic posttraumatic and postsurgical changes of the bilateral hips. No acute displaced fracture. Reconstructed images demonstrate no additional findings. Review of the MIP images confirms the above findings. IMPRESSION: Chest: 1. No evidence of pulmonary embolus. 2.  Cardiomegaly and coronary artery atherosclerosis. 3.  Aortic Atherosclerosis (ICD10-I70.0). Abdomen/pelvis: 1. No acute intra-abdominal or intrapelvic process. 2. Colonic diverticulosis without diverticulitis. 3. Loop colostomy left mid abdomen.  No bowel obstruction or ileus. 4. Decubitus ulcers overlying the sacrum and left ischium. Evidence of chronic osteomyelitis of the distal sacrum unchanged. 5. Chronic indwelling suprapubic catheter, with nonspecific bladder wall thickening. 6.  Aortic Atherosclerosis (ICD10-I70.0). Electronically Signed   By: Sharlet Salina M.D.   On: 02/27/2023 22:55   CT Angio Chest PE W and/or Wo Contrast  Result Date: 02/27/2023 CLINICAL DATA:  Left arm weakness, diverticulitis, sepsis, shortness of breath EXAM: CT ANGIOGRAPHY CHEST CT ABDOMEN AND PELVIS WITH CONTRAST TECHNIQUE: Multidetector CT imaging of the chest was performed using the standard protocol during bolus administration of intravenous contrast. Multiplanar CT image reconstructions and MIPs were obtained to evaluate the vascular anatomy. Multidetector CT imaging of the abdomen and pelvis was performed using the standard protocol during bolus administration of intravenous contrast. RADIATION DOSE REDUCTION: This exam was performed according to the departmental dose-optimization program which includes automated exposure control, adjustment of the mA and/or kV according to patient size and/or use of iterative reconstruction technique. CONTRAST:  OMNIPAQUE IOHEXOL 350 MG/ML SOLN COMPARISON:  02/27/2023, 05/02/2022 FINDINGS: CTA CHEST FINDINGS Cardiovascular: This  is a technically adequate evaluation of the pulmonary vasculature. No filling defects or pulmonary emboli. Mild cardiomegaly without pericardial effusion. No evidence of thoracic aortic aneurysm or dissection. Atherosclerosis of the aorta and coronary vasculature. Mediastinum/Nodes: Heterogeneous asymmetric enlargement of the left lobe thyroid. Segmental areas soft g is noted, nonspecific. No esophageal wall thickening. Trachea is unremarkable. No pathologic adenopathy. Lungs/Pleura: No acute airspace disease, effusion, or pneumothorax. Central airways are patent. Musculoskeletal: No acute or destructive bony abnormalities. Severe degenerative changes of the bilateral shoulders. Progressive severe spondylosis at the thoracolumbar junction, with degenerative anterolisthesis of T11 relative T12. Prior T12 laminectomy and T11 laminotomy. Reconstructed images demonstrate no additional findings. Review of the MIP images confirms the above findings. CT ABDOMEN and PELVIS FINDINGS Hepatobiliary: Coarse benign appearing calcifications are seen within the ventral aspect right lobe liver. No other focal liver abnormalities. Gallbladder is unremarkable, with expected postsurgical dilatation of the common bile duct. Pancreas: Unremarkable. No pancreatic ductal dilatation or surrounding inflammatory changes. Spleen: Normal in size without focal abnormality. Adrenals/Urinary Tract: Kidneys are unremarkable, with no urinary tract calculi or obstructive uropathy. The adrenals are stable. Bladder is decompressed with a suprapubic catheter, with chronic nonspecific bladder wall thickening. Stomach/Bowel: Loop colostomy within the left mid abdomen. No bowel obstruction or ileus. Colonic diverticulosis without evidence of acute diverticulitis. No bowel wall thickening or inflammatory change. Vascular/Lymphatic: Aortic atherosclerosis. No enlarged abdominal or pelvic lymph nodes. Reproductive: Status post hysterectomy. No adnexal  masses. Other: No free fluid or free intraperitoneal gas. Musculoskeletal: Decubitus ulcers are seen within the mid sacral region and overlying the left ischium. There is evidence of chronic osteomyelitis of the sacrum. I do not see any bony destruction at the left ischium. Chronic posttraumatic and postsurgical changes of the bilateral hips. No acute displaced fracture. Reconstructed images demonstrate no additional findings. Review of the MIP images confirms the above findings. IMPRESSION: Chest: 1. No evidence of pulmonary embolus. 2. Cardiomegaly and coronary artery atherosclerosis. 3.  Aortic Atherosclerosis (ICD10-I70.0). Abdomen/pelvis: 1. No acute intra-abdominal or intrapelvic process. 2. Colonic diverticulosis without diverticulitis. 3. Loop colostomy left mid abdomen.  No bowel obstruction or ileus. 4. Decubitus ulcers overlying the sacrum and left ischium. Evidence of chronic osteomyelitis  of the distal sacrum unchanged. 5. Chronic indwelling suprapubic catheter, with nonspecific bladder wall thickening. 6.  Aortic Atherosclerosis (ICD10-I70.0). Electronically Signed   By: Sharlet Salina M.D.   On: 02/27/2023 22:55   CT HEAD WO CONTRAST  Result Date: 02/27/2023 CLINICAL DATA:  Neuro deficit, acute, stroke suspected EXAM: CT HEAD WITHOUT CONTRAST TECHNIQUE: Contiguous axial images were obtained from the base of the skull through the vertex without intravenous contrast. RADIATION DOSE REDUCTION: This exam was performed according to the departmental dose-optimization program which includes automated exposure control, adjustment of the mA and/or kV according to patient size and/or use of iterative reconstruction technique. COMPARISON:  Brain MR 03/09/21 FINDINGS: Brain: No evidence of acute infarction, hemorrhage, hydrocephalus, extra-axial collection or mass lesion/mass effect. Extensive periventricular hypodensity likely represents sequela multiple sclerosis. Vascular: No hyperdense vessel or unexpected  calcification. Skull: Normal. Negative for fracture or focal lesion. Sinuses/Orbits: No middle ear or mastoid effusion. Paranasal sinuses are notable for complete opacification of the right sphenoid sinus with osseous changes suggestive of chronic right sphenoid sinusitis. Other: None. IMPRESSION: No acute intracranial abnormality Electronically Signed   By: Lorenza Cambridge M.D.   On: 02/27/2023 20:43   DG Chest Port 1 View  Result Date: 02/27/2023 CLINICAL DATA:  Sepsis. Left arm weakness and leaning. Shortness of breath. EXAM: PORTABLE CHEST 1 VIEW COMPARISON:  04/05/2021 FINDINGS: Patient rotation limits examination. Shallow inspiration. Heart size and pulmonary vascularity are probably normal for technique. No pleural effusions. No pneumothorax. Mediastinal contours appear intact. IMPRESSION: Shallow inspiration.  No evidence of active pulmonary disease. Electronically Signed   By: Burman Nieves M.D.   On: 02/27/2023 20:22    Scheduled Meds:  baclofen  10 mg Oral Q1500   baclofen  15 mg Oral Daily   baclofen  20 mg Oral QHS   Dimethyl Fumarate  240 mg Oral BID   DULoxetine  90 mg Oral Daily   fesoterodine  4 mg Oral Daily   gabapentin  200 mg Oral TID   heparin  5,000 Units Subcutaneous Q8H   insulin aspart  0-5 Units Subcutaneous QHS   insulin aspart  0-9 Units Subcutaneous TID WC   magnesium oxide  400 mg Oral BID   methimazole  5 mg Oral Q0600   metoprolol succinate  50 mg Oral QPC breakfast   pantoprazole  40 mg Oral Q0600   predniSONE  2.5 mg Oral QODAY   predniSONE  5 mg Oral QODAY   Continuous Infusions:  ceFEPime (MAXIPIME) IV 2 g (03/01/23 0936)   vancomycin Stopped (02/28/23 2307)     LOS: 1 day    Time spent: 50 mins    Willeen Niece, MD [facility]   If 7PM-7AM, please contact night-coverage

## 2023-03-01 NOTE — Plan of Care (Signed)
  Problem: Education: Goal: Ability to describe self-care measures that may prevent or decrease complications (Diabetes Survival Skills Education) will improve Outcome: Progressing Goal: Individualized Educational Video(s) Outcome: Progressing   Problem: Nutritional: Goal: Maintenance of adequate nutrition will improve Outcome: Progressing Goal: Progress toward achieving an optimal weight will improve Outcome: Progressing   Problem: Skin Integrity: Goal: Risk for impaired skin integrity will decrease Outcome: Progressing

## 2023-03-02 DIAGNOSIS — R6511 Systemic inflammatory response syndrome (SIRS) of non-infectious origin with acute organ dysfunction: Secondary | ICD-10-CM | POA: Diagnosis not present

## 2023-03-02 LAB — CBC
HCT: 35 % — ABNORMAL LOW (ref 36.0–46.0)
Hemoglobin: 10.9 g/dL — ABNORMAL LOW (ref 12.0–15.0)
MCH: 29.2 pg (ref 26.0–34.0)
MCHC: 31.1 g/dL (ref 30.0–36.0)
MCV: 93.8 fL (ref 80.0–100.0)
Platelets: 217 10*3/uL (ref 150–400)
RBC: 3.73 MIL/uL — ABNORMAL LOW (ref 3.87–5.11)
RDW: 14.7 % (ref 11.5–15.5)
WBC: 9.2 10*3/uL (ref 4.0–10.5)
nRBC: 0 % (ref 0.0–0.2)

## 2023-03-02 LAB — BASIC METABOLIC PANEL
Anion gap: 8 (ref 5–15)
BUN: 6 mg/dL — ABNORMAL LOW (ref 8–23)
CO2: 24 mmol/L (ref 22–32)
Calcium: 8.9 mg/dL (ref 8.9–10.3)
Chloride: 107 mmol/L (ref 98–111)
Creatinine, Ser: 0.42 mg/dL — ABNORMAL LOW (ref 0.44–1.00)
GFR, Estimated: >60 mL/min (ref >60)
Glucose, Bld: 90 mg/dL (ref 70–99)
Potassium: 4.7 mmol/L (ref 3.5–5.1)
Sodium: 139 mmol/L (ref 135–145)

## 2023-03-02 LAB — GLUCOSE, CAPILLARY
Glucose-Capillary: 84 mg/dL (ref 70–99)
Glucose-Capillary: 116 mg/dL — ABNORMAL HIGH (ref 70–99)
Glucose-Capillary: 228 mg/dL — ABNORMAL HIGH (ref 70–99)
Glucose-Capillary: 212 mg/dL — ABNORMAL HIGH (ref 70–99)

## 2023-03-02 LAB — MAGNESIUM: Magnesium: 1.9 mg/dL (ref 1.7–2.4)

## 2023-03-02 LAB — PHOSPHORUS: Phosphorus: 2.6 mg/dL (ref 2.5–4.6)

## 2023-03-02 NOTE — Progress Notes (Signed)
PROGRESS NOTE    Laura Roberts  YNW:[id number] DOB: [birth year] DOA: 02/27/2023 PCP: Marinda Elk, MD   Brief Narrative:  This 82 years old female with PMH significant of multiple sclerosis with paraplegia, bedbound status, history of hypertension, type 2 diabetes on insulin, chronic sacral decubitus ulcer POA, chronic sacral osteomyelitis POA, history of colostomy, history of chronic suprapubic catheter, history of chronic steroid use presented in the ED with left-sided weakness.  EMS was called for patient having left-sided weakness interpreted as a stroke.  Patient has left-sided weakness from multiple sclerosis.  Workup in the ED.  CT head negative for acute infarct, CT chest ruled out pulmonary embolism.  CT abdomen and pelvis no acute intra-abdominal or intrapelvic abnormalities noted.  Patient is admitted for sepsis with unknown cause.  Patient does have chronic decubitus ulcer which does not appear infected.  Patient started on empiric antibiotics.  Procalcitonin is 40.1. Patient seems improved.   Assessment & Plan:   Principal Problem:   SIRS of non-infectious origin w acute organ dysfunction (HCC) Active Problems:   Multiple sclerosis (HCC)   Essential hypertension   Paraplegia (HCC)   Type 2 diabetes mellitus with diabetic neuropathy, with long-term current use of insulin (HCC)   Chronic suprapubic catheter (HCC)   Decubitus ulcer   Chronic osteomyelitis (HCC)   S/P colostomy (HCC)   DNR (do not resuscitate)/DNI(Do not intubate)  Sepsis of unknown etiology: Patient presented with fever and tachycardia, lactic acid 2.4, procalcitonin 40.1. CTA negative for PE. CT abd showed chronic osteomyelitis of sacrum.  Pt's decub ulcers are healing well and without any drainage.  Initiated on empiric antibiotics ( IV cefepime and vanco). Covid is negative.  Do not have a source for infection but pt is still febrile and tachycardic.  Procalcitonin 40.1.  Continue vancomycin and  cefepime. Blood cultures: No growth so far, Urine cultures grows Enterobacter. Will de-escalate antibiotics.  Discontinue vancomycin.   S/P colostomy (HCC) Chronic. Continue colostomy care.   Chronic osteomyelitis (HCC) Stable. Doubt this is the source of her fever/tachycardia.   Decubitus ulcer: Chronic. Presents on admission. Wounds without any drainage.  RN reports that family said that pt's wounds are healing nicely.  Chronic suprapubic catheter (HCC) Chronic. UA negative.  Urine culture grew Enterobacter   Type 2 diabetes with diabetic neuropathy: Stable.  Continue regular insulin sliding scale.   Paraplegia (HCC) Chronic. Due to MS.   Essential hypertension Stable. Continue with toprol-xl. Hold lasix while pt is febrile.   Multiple sclerosis (HCC) Chronic. Follows with neurology. She is  bedbound.   DNR (do not resuscitate)/DNI(Do not intubate) Reviewed DNR and most form.  DVT prophylaxis: Heparin sq Code Status: DNR Family Communication: No family at bed side Disposition Plan:    Status is: Inpatient Remains inpatient appropriate because: Admitted for sepsis of unknown etiology, continued on IV antibiotics.  Blood cultures no growth so far, urine culture grew Enterobacter   Consultants:  None  Procedures:  Antimicrobials:  Anti-infectives (From admission, onward)    Start     Dose/Rate Route Frequency Ordered Stop   02/28/23 2100  vancomycin (VANCOREADY) IVPB 750 mg/150 mL        750 mg 150 mL/hr over 60 Minutes Intravenous Every 24 hours 02/28/23 0610     02/28/23 0800  ceFEPIme (MAXIPIME) 2 g in sodium chloride 0.9 % 100 mL IVPB        2 g 200 mL/hr over 30 Minutes Intravenous Every 12 hours 02/28/23 0610  02/27/23 2015  ceFEPIme (MAXIPIME) 2 g in sodium chloride 0.9 % 100 mL IVPB        2 g 200 mL/hr over 30 Minutes Intravenous  Once 02/27/23 2005 02/27/23 2047   02/27/23 2015  metroNIDAZOLE (FLAGYL) IVPB 500 mg        500 mg 100 mL/hr over 60  Minutes Intravenous  Once 02/27/23 2005 02/27/23 2153   02/27/23 2015  vancomycin (VANCOCIN) IVPB 1000 mg/200 mL premix  Status:  Discontinued        1,000 mg 200 mL/hr over 60 Minutes Intravenous  Once 02/27/23 2005 02/27/23 2010   02/27/23 2015  vancomycin (VANCOREADY) IVPB 1500 mg/300 mL        1,500 mg 150 mL/hr over 120 Minutes Intravenous  Once 02/27/23 2011 02/28/23 0011      Subjective: Patient was seen and examined at bedside.  Overnight events noted.   Patient reports doing better.  She remains afebrile since she is here. Denies any other concerns.  She denies any urinary burning.  Objective: Vitals:   03/02/23 0327 03/02/23 0434 03/02/23 1243 03/02/23 1251  BP: 129/74  125/78   Pulse: 82  89 94  Resp: 18  18   Temp: (!) 97.5 F (36.4 C)  97.8 F (36.6 C)   TempSrc: Oral  Oral   SpO2: 100%  100% 99%  Weight:  79.8 kg    Height:        Intake/Output Summary (Last 24 hours) at 03/02/2023 1351 Last data filed at 03/02/2023 0812 Gross per 24 hour  Intake 480 ml  Output 1550 ml  Net -1070 ml   Filed Weights   02/27/23 1940 03/01/23 0500 03/02/23 0434  Weight: 79.8 kg 79.8 kg 79.8 kg    Examination:  General exam: Appears comfortable, deconditioned, not in any acute distress. Respiratory system: CTA bilaterally. Respiratory effort normal. RR 14 Cardiovascular system: S1 & S2 heard, RRR. No JVD, murmurs, rubs, gallops or clicks. No pedal edema. Gastrointestinal system: Abdomen is non distended, soft and non tender. Normal bowel sounds heard. Central nervous system: Alert and oriented x 3. No focal neurological deficits. Extremities: No edema, no cyanosis, no clubbing. Skin: Chronic sacral decubitus ulcer healing. Psychiatry: Judgement and insight appear normal. Mood & affect appropriate.     Data Reviewed: I have personally reviewed following labs and imaging studies  CBC: Recent Labs  Lab 02/27/23 2000 02/27/23 2014 02/28/23 0848 03/01/23 0643  03/02/23 0605  WBC 14.5*  --  20.4* 11.7* 9.2  NEUTROABS 13.7*  --  17.0*  --   --   HGB 13.7 14.6 11.3* 10.8* 10.9*  HCT 43.0 43.0 36.1 33.7* 35.0*  MCV 92.9  --  93.8 94.9 93.8  PLT 228  --  214 209 217   Basic Metabolic Panel: Recent Labs  Lab 02/27/23 2000 02/27/23 2014 02/28/23 0848 03/01/23 0643 03/02/23 0605  NA 137 137 137 140 139  K 4.4 4.1 4.0 3.3* 4.7  CL 97* 100 105 106 107  CO2 23  --  23 25 24   GLUCOSE 198* 208* 112* 100* 90  BUN 26* 27* 17 9 6*  CREATININE 0.55 0.60 0.49 0.50 0.42*  CALCIUM 9.4  --  8.1* 8.8* 8.9  MG  --   --   --  1.9 1.9  PHOS  --   --   --  2.6 2.6   GFR: Estimated Creatinine Clearance: 54.9 mL/min (A) (by C-G formula based on SCr of 0.42 mg/dL (L)).  Liver Function Tests: Recent Labs  Lab 02/27/23 2000 02/28/23 0848  AST 58* 33  ALT 35 32  ALKPHOS 80 63  BILITOT 1.6* 1.2  PROT 7.4 6.0*  ALBUMIN 3.8 2.8*   No results for input(s): "LIPASE", "AMYLASE" in the last 168 hours. No results for input(s): "AMMONIA" in the last 168 hours. Coagulation Profile: Recent Labs  Lab 02/27/23 2000  INR 1.0   Cardiac Enzymes: No results for input(s): "CKTOTAL", "CKMB", "CKMBINDEX", "TROPONINI" in the last 168 hours. BNP (last 3 results) No results for input(s): "PROBNP" in the last 8760 hours. HbA1C: Recent Labs    02/28/23 0848  HGBA1C 6.4*   CBG: Recent Labs  Lab 03/01/23 1203 03/01/23 1719 03/01/23 2045 03/02/23 0731 03/02/23 1148  GLUCAP 167* 146* 122* 84 116*   Lipid Profile: No results for input(s): "CHOL", "HDL", "LDLCALC", "TRIG", "CHOLHDL", "LDLDIRECT" in the last 72 hours. Thyroid Function Tests: No results for input(s): "TSH", "T4TOTAL", "FREET4", "T3FREE", "THYROIDAB" in the last 72 hours. Anemia Panel: No results for input(s): "VITAMINB12", "FOLATE", "FERRITIN", "TIBC", "IRON", "RETICCTPCT" in the last 72 hours. Sepsis Labs: Recent Labs  Lab 02/27/23 2034 02/27/23 2200 02/27/23 2213 02/28/23 0848   PROCALCITON  --  15.97  --  40.88  LATICACIDVEN 2.4*  --  2.2*  --     Recent Results (from the past 240 hour(s))  Respiratory (~20 pathogens) panel by PCR     Status: None   Collection Time: 02/27/23  1:06 AM   Specimen: Nasopharyngeal Swab; Respiratory  Result Value Ref Range Status   Adenovirus NOT DETECTED NOT DETECTED Final   Coronavirus 229E NOT DETECTED NOT DETECTED Final    Comment: (NOTE) The Coronavirus on the Respiratory Panel, DOES NOT test for the novel  Coronavirus (2019 nCoV)    Coronavirus HKU1 NOT DETECTED NOT DETECTED Final   Coronavirus NL63 NOT DETECTED NOT DETECTED Final   Coronavirus OC43 NOT DETECTED NOT DETECTED Final   Metapneumovirus NOT DETECTED NOT DETECTED Final   Rhinovirus / Enterovirus NOT DETECTED NOT DETECTED Final   Influenza A NOT DETECTED NOT DETECTED Final   Influenza B NOT DETECTED NOT DETECTED Final   Parainfluenza Virus 1 NOT DETECTED NOT DETECTED Final   Parainfluenza Virus 2 NOT DETECTED NOT DETECTED Final   Parainfluenza Virus 3 NOT DETECTED NOT DETECTED Final   Parainfluenza Virus 4 NOT DETECTED NOT DETECTED Final   Respiratory Syncytial Virus NOT DETECTED NOT DETECTED Final   Bordetella pertussis NOT DETECTED NOT DETECTED Final   Bordetella Parapertussis NOT DETECTED NOT DETECTED Final   Chlamydophila pneumoniae NOT DETECTED NOT DETECTED Final   Mycoplasma pneumoniae NOT DETECTED NOT DETECTED Final    Comment: Performed at [facility], [address], [place], [place] [address]  Blood Culture (routine x 2)     Status: None (Preliminary result)   Collection Time: 02/27/23  8:10 PM   Specimen: BLOOD  Result Value Ref Range Status   Specimen Description   Final    BLOOD BLOOD RIGHT FOREARM Performed at [facility], [address] [place], [place] [address]    Special Requests   Final    BOTTLES DRAWN AEROBIC AND ANAEROBIC Blood Culture adequate volume Performed at [facility], [address], [place], [place] [address]    Culture   Final    NO GROWTH 3 DAYS Performed at [facility], [address], [place], [place] [address]    Report Status PENDING  Incomplete  Blood Culture (routine x 2)     Status: None (Preliminary result)   Collection Time: 02/27/23  8:20 PM   Specimen: BLOOD  Result Value Ref Range Status   Specimen Description   Final    BLOOD RIGHT ANTECUBITAL Performed at [facility], [address] [place], [place] [address]    Special Requests   Final    BOTTLES DRAWN AEROBIC AND ANAEROBIC Blood Culture results may not be optimal due to an excessive volume of blood received in culture bottles Performed at [facility], [address] [place], [place] [address]    Culture   Final    NO GROWTH 3 DAYS Performed at [facility], [address], [place], [place] [address]    Report Status PENDING  Incomplete  Resp panel by RT-PCR (RSV, Flu A&B, Covid) Anterior Nasal Swab     Status: None   Collection Time: 02/27/23  8:35 PM   Specimen: Anterior Nasal Swab  Result Value Ref Range Status   SARS Coronavirus 2 by RT PCR NEGATIVE NEGATIVE Final    Comment: (NOTE) SARS-CoV-2 target nucleic acids are NOT DETECTED.  The SARS-CoV-2 RNA is generally detectable in upper respiratory specimens during the acute phase of infection. The lowest concentration of SARS-CoV-2 viral copies this assay can detect is 138 copies/mL. A negative result does not preclude SARS-Cov-2 infection and should not be used as the sole basis for treatment or other patient management decisions. A negative result may occur with  improper specimen collection/handling, submission of specimen other than nasopharyngeal swab, presence of viral mutation(s) within the areas targeted by this assay, and inadequate number of viral copies(<138 copies/mL). A negative result must be combined with clinical observations, patient history, and  epidemiological information. The expected result is Negative.  Fact Sheet for Patients:  BloggerCourse.com  Fact Sheet for Healthcare Providers:  SeriousBroker.it  This test is no t yet approved or cleared by the [place] FDA and  has been authorized for detection and/or diagnosis of SARS-CoV-2 by FDA under an Emergency Use Authorization (EUA). This EUA will remain  in effect (meaning this test can be used) for the duration of the COVID-19 declaration under Section 564(b)(1) of the Act, 21 U.S.C.section 360bbb-3(b)(1), unless the authorization is terminated  or revoked sooner.       Influenza A by PCR NEGATIVE NEGATIVE Final   Influenza B by PCR NEGATIVE NEGATIVE Final    Comment: (NOTE) The Xpert Xpress SARS-CoV-2/FLU/RSV plus assay is intended as an aid in the diagnosis of influenza from Nasopharyngeal swab specimens and should not be used as a sole basis for treatment. Nasal washings and aspirates are unacceptable for Xpert Xpress SARS-CoV-2/FLU/RSV testing.  Fact Sheet for Patients: BloggerCourse.com  Fact Sheet for Healthcare Providers: SeriousBroker.it  This test is not yet approved or cleared by the [place] FDA and has been authorized for detection and/or diagnosis of SARS-CoV-2 by FDA under an Emergency Use Authorization (EUA). This EUA will remain in effect (meaning this test can be used) for the duration of the COVID-19 declaration under Section 564(b)(1) of the Act, 21 U.S.C. section 360bbb-3(b)(1), unless the authorization is terminated or revoked.     Resp Syncytial Virus by PCR NEGATIVE NEGATIVE Final    Comment: (NOTE) Fact Sheet for Patients: BloggerCourse.com  Fact Sheet for Healthcare Providers: SeriousBroker.it  This test is not yet approved or cleared by the [place] FDA and has been  authorized for detection and/or diagnosis of  SARS-CoV-2 by FDA under an Emergency Use Authorization (EUA). This EUA will remain in effect (meaning this test can be used) for the duration of the COVID-19 declaration under Section 564(b)(1) of the Act, 21 U.S.C. section 360bbb-3(b)(1), unless the authorization is terminated or revoked.  Performed at [facility], [address] [place], [place] [address]   Urine Culture     Status: Abnormal (Preliminary result)   Collection Time: 02/27/23 10:46 PM   Specimen: Urine, Catheterized  Result Value Ref Range Status   Specimen Description   Final    URINE, CATHETERIZED Performed at [facility], [address], [place], [place] [address]    Special Requests   Final    NONE Performed at [facility], [address] [place], [place] [address]    Culture (A)  Final    >=100,000 COLONIES/mL ENTEROCOCCUS FAECALIS 20,000 COLONIES/mL ESCHERICHIA COLI SUSCEPTIBILITIES TO FOLLOW Performed at [facility], [address], [place], [place] [address]    Report Status PENDING  Incomplete   Organism ID, Bacteria ENTEROCOCCUS FAECALIS (A)  Final      Susceptibility   Enterococcus faecalis - MIC*    AMPICILLIN <=2 SENSITIVE Sensitive     NITROFURANTOIN <=16 SENSITIVE Sensitive     VANCOMYCIN 1 SENSITIVE Sensitive     * >=100,000 COLONIES/mL ENTEROCOCCUS FAECALIS    Radiology Studies: No results found.  Scheduled Meds:  baclofen  10 mg Oral Q1500   baclofen  15 mg Oral Daily   baclofen  20 mg Oral QHS   Dimethyl Fumarate  240 mg Oral BID   DULoxetine  90 mg Oral Daily   fesoterodine  4 mg Oral Daily   gabapentin  200 mg Oral TID   heparin  5,000 Units Subcutaneous Q8H   insulin aspart  0-5 Units Subcutaneous QHS   insulin aspart  0-9 Units Subcutaneous TID WC   magnesium oxide  400 mg Oral BID   methimazole  5 mg Oral Q0600   metoprolol succinate  50 mg Oral QPC breakfast    pantoprazole  40 mg Oral Q0600   predniSONE  2.5 mg Oral QODAY   predniSONE  5 mg Oral QODAY   Continuous Infusions:  ceFEPime (MAXIPIME) IV Stopped (03/02/23 1238)   vancomycin 750 mg (03/01/23 2231)     LOS: 2 days    Time spent: 35 mins    Willeen Niece, MD [facility]   If 7PM-7AM, please contact night-coverage

## 2023-03-02 NOTE — Plan of Care (Signed)

## 2023-03-03 DIAGNOSIS — R6511 Systemic inflammatory response syndrome (SIRS) of non-infectious origin with acute organ dysfunction: Secondary | ICD-10-CM | POA: Diagnosis not present

## 2023-03-03 LAB — GLUCOSE, CAPILLARY
Glucose-Capillary: 87 mg/dL (ref 70–99)
Glucose-Capillary: 115 mg/dL — ABNORMAL HIGH (ref 70–99)
Glucose-Capillary: 135 mg/dL — ABNORMAL HIGH (ref 70–99)
Glucose-Capillary: 189 mg/dL — ABNORMAL HIGH (ref 70–99)

## 2023-03-03 LAB — URINE CULTURE: Culture: >=100000 — AB

## 2023-03-03 NOTE — Progress Notes (Signed)
Pharmacy Antibiotic Note  Laura Roberts is a 82 y.o. female  with hx MS, indwelling cath, decubitus ulcers and chronic sacral osteomyelitis who presented to the ED on 02/27/2023 with c/o generalized weakness.  She was started on vancomycin, cefepime and flagyl on admission for suspected sepsis.  - 8/29 chest/abd CT: Decubitus ulcers overlying the sacrum and left ischium. Evidence of chronic osteomyelitis of the distal sacrum unchanged.   - 8/29 BCx x2:   - 8/29 UCx: >100K enterococcus faecalis (pans sens); 20K Ecoli   Today, 03/03/2023: - day #4 abx - afeb, wnc wnl - scr low (crcl~55)   Plan: - continue cefepime 2gm q12h and vancomycin 750 mg IV q24h  - f/u with plan/LOT for abx. Pharmacy will plan on checking vancomycin levels if plan is to treat with abx for > 7 days  _________________________________________  Height: 5\' 2"  (157.5 cm) Weight: 79.8 kg (175 lb 15.5 oz) IBW/kg (Calculated) : 50.1  Temp (24hrs), Avg:98.1 F (36.7 C), Min:97.8 F (36.6 C), Max:98.6 F (37 C)  Recent Labs  Lab 02/27/23 2000 02/27/23 2014 02/27/23 2034 02/27/23 2213 02/28/23 0848 03/01/23 0643 03/02/23 0605  WBC 14.5*  --   --   --  20.4* 11.7* 9.2  CREATININE 0.55 0.60  --   --  0.49 0.50 0.42*  LATICACIDVEN  --   --  2.4* 2.2*  --   --   --     Estimated Creatinine Clearance: 54.9 mL/min (A) (by C-G formula based on SCr of 0.42 mg/dL (L)).    Allergies  Allergen Reactions   Codeine Shortness Of Breath and Other (See Comments)    Difficult breathing and skin problem   Ultram [Tramadol] Shortness Of Breath and Other (See Comments)    Difficult breathing and skin peeling   Januvia [Sitagliptin] Rash and Other (See Comments)    Blisters     Thank you for allowing pharmacy to be a part of this patient's care.  Lucia Gaskins 03/03/2023 7:33 AM

## 2023-03-03 NOTE — Progress Notes (Signed)
PROGRESS NOTE    Laura Roberts  UJW:[id number] DOB: [birth year] DOA: 02/27/2023 PCP: Marinda Elk, MD   Brief Narrative:  This 82 years old female with PMH significant of multiple sclerosis with paraplegia, bedbound status, history of hypertension, type 2 diabetes on insulin, chronic sacral decubitus ulcer POA, chronic sacral osteomyelitis POA, history of colostomy, history of chronic suprapubic catheter, history of chronic steroid use presented in the ED with left-sided weakness.  EMS was called for patient having left-sided weakness interpreted as a stroke.  Patient has left-sided weakness from multiple sclerosis.  Workup in the ED.  CT head negative for acute infarct, CT chest ruled out pulmonary embolism.  CT abdomen and pelvis no acute intra-abdominal or intrapelvic abnormalities noted.  Patient is admitted for sepsis with unknown cause.  Patient does have chronic decubitus ulcer which does not appear infected.  Patient started on empiric antibiotics.  Procalcitonin is 40.1. Patient seems improved.   Assessment & Plan:   Principal Problem:   SIRS of non-infectious origin w acute organ dysfunction (HCC) Active Problems:   Multiple sclerosis (HCC)   Essential hypertension   Paraplegia (HCC)   Type 2 diabetes mellitus with diabetic neuropathy, with long-term current use of insulin (HCC)   Chronic suprapubic catheter (HCC)   Decubitus ulcer   Chronic osteomyelitis (HCC)   S/P colostomy (HCC)   DNR (do not resuscitate)/DNI(Do not intubate)  Sepsis of unknown etiology: Patient presented with fever and tachycardia, lactic acid 2.4, procalcitonin 40.1. CTA negative for PE. CT abd showed chronic osteomyelitis of sacrum.  Pt's decub ulcers are healing well and without any drainage.  Initiated on empiric antibiotics ( IV cefepime and vanco). Covid is negative.  Do not have a source for infection but pt was still febrile and tachycardic.  Procalcitonin 40.1.  Continue vancomycin and  cefepime. Blood cultures: No growth so far, Urine cultures grew Enterobacter/ E .coli. Will de-escalate antibiotics,  continue cefepime.   S/P colostomy (HCC) Chronic. Continue colostomy care.   Chronic osteomyelitis (HCC) Stable. Doubt this is the source of her fever/tachycardia.   Decubitus ulcer: Chronic. Presents on admission. Wounds without any drainage.  RN reports that family said that pt's wounds are healing nicely.  Chronic suprapubic catheter (HCC) Chronic. UA negative.  Urine culture grew Enterobacter. Continue antibiotics.   Type 2 diabetes with diabetic neuropathy: Stable.  Continue regular insulin sliding scale.   Paraplegia (HCC) Chronic. Due to MS.   Essential hypertension Stable. Continue with toprol-xl. Hold lasix while pt is febrile.   Multiple sclerosis (HCC) Chronic. Follows with neurology. She is  bedbound.   DNR (do not resuscitate)/DNI(Do not intubate) Reviewed DNR and most form.  DVT prophylaxis: Heparin sq Code Status: DNR Family Communication: No family at bed side Disposition Plan:    Status is: Inpatient Remains inpatient appropriate because: Admitted for sepsis of unknown etiology, continued on IV antibiotics.  Blood cultures no growth so far, urine culture grew Enterobacter, finding  out duration of antibiotics.   Consultants:  None  Procedures:  Antimicrobials:  Anti-infectives (From admission, onward)    Start     Dose/Rate Route Frequency Ordered Stop   02/28/23 2100  vancomycin (VANCOREADY) IVPB 750 mg/150 mL        750 mg 150 mL/hr over 60 Minutes Intravenous Every 24 hours 02/28/23 0610     02/28/23 0800  ceFEPIme (MAXIPIME) 2 g in sodium chloride 0.9 % 100 mL IVPB        2 g 200 mL/hr  over 30 Minutes Intravenous Every 12 hours 02/28/23 0610     02/27/23 2015  ceFEPIme (MAXIPIME) 2 g in sodium chloride 0.9 % 100 mL IVPB        2 g 200 mL/hr over 30 Minutes Intravenous  Once 02/27/23 2005 02/27/23 2047   02/27/23 2015   metroNIDAZOLE (FLAGYL) IVPB 500 mg        500 mg 100 mL/hr over 60 Minutes Intravenous  Once 02/27/23 2005 02/27/23 2153   02/27/23 2015  vancomycin (VANCOCIN) IVPB 1000 mg/200 mL premix  Status:  Discontinued        1,000 mg 200 mL/hr over 60 Minutes Intravenous  Once 02/27/23 2005 02/27/23 2010   02/27/23 2015  vancomycin (VANCOREADY) IVPB 1500 mg/300 mL        1,500 mg 150 mL/hr over 120 Minutes Intravenous  Once 02/27/23 2011 02/28/23 0011      Subjective: Patient was seen and examined at bedside.  Overnight events noted.   Patient reports doing better.  She remains afebrile since she is here. Denies any other concerns.  She denies any urinary burning.  Objective: Vitals:   03/02/23 2009 03/03/23 0548 03/03/23 0832 03/03/23 1200  BP: (!) 144/77 (!) 163/89 (!) 155/87 (!) 150/86  Pulse: 91 95 90 (!) 105  Resp: 18 18  17   Temp: 98 F (36.7 C) 98.6 F (37 C)  99.3 F (37.4 C)  TempSrc: Oral Oral  Oral  SpO2: 99% 97%  96%  Weight:      Height:        Intake/Output Summary (Last 24 hours) at 03/03/2023 1301 Last data filed at 03/03/2023 0600 Gross per 24 hour  Intake 791.47 ml  Output 1150 ml  Net -358.53 ml   Filed Weights   02/27/23 1940 03/01/23 0500 03/02/23 0434  Weight: 79.8 kg 79.8 kg 79.8 kg    Examination:  General exam: Appears comfortable, deconditioned, not in any acute distress. Respiratory system: CTA bilaterally. Respiratory effort normal. RR 16 Cardiovascular system: S1 & S2 heard, RRR. No JVD, murmurs, rubs, gallops or clicks. No pedal edema. Gastrointestinal system: Abdomen is non distended, soft and non tender. Normal bowel sounds heard. Colostomy noted. Central nervous system: Alert and oriented x 3. No focal neurological deficits. Extremities: No edema, no cyanosis, no clubbing. Skin: Chronic sacral decubitus ulcer healing. Psychiatry: Judgement and insight appear normal. Mood & affect appropriate.     Data Reviewed: I have personally reviewed  following labs and imaging studies  CBC: Recent Labs  Lab 02/27/23 2000 02/27/23 2014 02/28/23 0848 03/01/23 0643 03/02/23 0605  WBC 14.5*  --  20.4* 11.7* 9.2  NEUTROABS 13.7*  --  17.0*  --   --   HGB 13.7 14.6 11.3* 10.8* 10.9*  HCT 43.0 43.0 36.1 33.7* 35.0*  MCV 92.9  --  93.8 94.9 93.8  PLT 228  --  214 209 217   Basic Metabolic Panel: Recent Labs  Lab 02/27/23 2000 02/27/23 2014 02/28/23 0848 03/01/23 0643 03/02/23 0605  NA 137 137 137 140 139  K 4.4 4.1 4.0 3.3* 4.7  CL 97* 100 105 106 107  CO2 23  --  23 25 24   GLUCOSE 198* 208* 112* 100* 90  BUN 26* 27* 17 9 6*  CREATININE 0.55 0.60 0.49 0.50 0.42*  CALCIUM 9.4  --  8.1* 8.8* 8.9  MG  --   --   --  1.9 1.9  PHOS  --   --   --  2.6 2.6   GFR: Estimated Creatinine Clearance: 54.9 mL/min (A) (by C-G formula based on SCr of 0.42 mg/dL (L)). Liver Function Tests: Recent Labs  Lab 02/27/23 2000 02/28/23 0848  AST 58* 33  ALT 35 32  ALKPHOS 80 63  BILITOT 1.6* 1.2  PROT 7.4 6.0*  ALBUMIN 3.8 2.8*   No results for input(s): "LIPASE", "AMYLASE" in the last 168 hours. No results for input(s): "AMMONIA" in the last 168 hours. Coagulation Profile: Recent Labs  Lab 02/27/23 2000  INR 1.0   Cardiac Enzymes: No results for input(s): "CKTOTAL", "CKMB", "CKMBINDEX", "TROPONINI" in the last 168 hours. BNP (last 3 results) No results for input(s): "PROBNP" in the last 8760 hours. HbA1C: No results for input(s): "HGBA1C" in the last 72 hours.  CBG: Recent Labs  Lab 03/02/23 1148 03/02/23 1643 03/02/23 2010 03/03/23 0758 03/03/23 1201  GLUCAP 116* 228* 212* 87 115*   Lipid Profile: No results for input(s): "CHOL", "HDL", "LDLCALC", "TRIG", "CHOLHDL", "LDLDIRECT" in the last 72 hours. Thyroid Function Tests: No results for input(s): "TSH", "T4TOTAL", "FREET4", "T3FREE", "THYROIDAB" in the last 72 hours. Anemia Panel: No results for input(s): "VITAMINB12", "FOLATE", "FERRITIN", "TIBC", "IRON",  "RETICCTPCT" in the last 72 hours. Sepsis Labs: Recent Labs  Lab 02/27/23 2034 02/27/23 2200 02/27/23 2213 02/28/23 0848  PROCALCITON  --  15.97  --  40.88  LATICACIDVEN 2.4*  --  2.2*  --     Recent Results (from the past 240 hour(s))  Respiratory (~20 pathogens) panel by PCR     Status: None   Collection Time: 02/27/23  1:06 AM   Specimen: Nasopharyngeal Swab; Respiratory  Result Value Ref Range Status   Adenovirus NOT DETECTED NOT DETECTED Final   Coronavirus 229E NOT DETECTED NOT DETECTED Final    Comment: (NOTE) The Coronavirus on the Respiratory Panel, DOES NOT test for the novel  Coronavirus (2019 nCoV)    Coronavirus HKU1 NOT DETECTED NOT DETECTED Final   Coronavirus NL63 NOT DETECTED NOT DETECTED Final   Coronavirus OC43 NOT DETECTED NOT DETECTED Final   Metapneumovirus NOT DETECTED NOT DETECTED Final   Rhinovirus / Enterovirus NOT DETECTED NOT DETECTED Final   Influenza A NOT DETECTED NOT DETECTED Final   Influenza B NOT DETECTED NOT DETECTED Final   Parainfluenza Virus 1 NOT DETECTED NOT DETECTED Final   Parainfluenza Virus 2 NOT DETECTED NOT DETECTED Final   Parainfluenza Virus 3 NOT DETECTED NOT DETECTED Final   Parainfluenza Virus 4 NOT DETECTED NOT DETECTED Final   Respiratory Syncytial Virus NOT DETECTED NOT DETECTED Final   Bordetella pertussis NOT DETECTED NOT DETECTED Final   Bordetella Parapertussis NOT DETECTED NOT DETECTED Final   Chlamydophila pneumoniae NOT DETECTED NOT DETECTED Final   Mycoplasma pneumoniae NOT DETECTED NOT DETECTED Final    Comment: Performed at [facility], [address], [place], [place] [address]  Blood Culture (routine x 2)     Status: None (Preliminary result)   Collection Time: 02/27/23  8:10 PM   Specimen: BLOOD  Result Value Ref Range Status   Specimen Description   Final    BLOOD BLOOD RIGHT FOREARM Performed at [facility], [address], [place], [place] [address]    Special Requests    Final    BOTTLES DRAWN AEROBIC AND ANAEROBIC Blood Culture adequate volume Performed at [facility], [address] [place], [place] [address]    Culture   Final    NO GROWTH 4 DAYS Performed at  [facility], [address], [place], [place] [address]    Report Status PENDING  Incomplete  Blood Culture (routine x 2)     Status: None (Preliminary result)   Collection Time: 02/27/23  8:20 PM   Specimen: BLOOD  Result Value Ref Range Status   Specimen Description   Final    BLOOD RIGHT ANTECUBITAL Performed at [facility], [address], [place], [place] [address]    Special Requests   Final    BOTTLES DRAWN AEROBIC AND ANAEROBIC Blood Culture results may not be optimal due to an excessive volume of blood received in culture bottles Performed at [facility], [address], [place], [place] [address]    Culture   Final    NO GROWTH 4 DAYS Performed at [facility], [address], [place], [place] [address]    Report Status PENDING  Incomplete  Resp panel by RT-PCR (RSV, Flu A&B, Covid) Anterior Nasal Swab     Status: None   Collection Time: 02/27/23  8:35 PM   Specimen: Anterior Nasal Swab  Result Value Ref Range Status   SARS Coronavirus 2 by RT PCR NEGATIVE NEGATIVE Final    Comment: (NOTE) SARS-CoV-2 target nucleic acids are NOT DETECTED.  The SARS-CoV-2 RNA is generally detectable in upper respiratory specimens during the acute phase of infection. The lowest concentration of SARS-CoV-2 viral copies this assay can detect is 138 copies/mL. A negative result does not preclude SARS-Cov-2 infection and should not be used as the sole basis for treatment or other patient management decisions. A negative result may occur with  improper specimen collection/handling, submission of specimen other than nasopharyngeal swab, presence of viral mutation(s) within the areas targeted by this assay, and inadequate  number of viral copies(<138 copies/mL). A negative result must be combined with clinical observations, patient history, and epidemiological information. The expected result is Negative.  Fact Sheet for Patients:  BloggerCourse.com  Fact Sheet for Healthcare Providers:  SeriousBroker.it  This test is no t yet approved or cleared by the [place] FDA and  has been authorized for detection and/or diagnosis of SARS-CoV-2 by FDA under an Emergency Use Authorization (EUA). This EUA will remain  in effect (meaning this test can be used) for the duration of the COVID-19 declaration under Section 564(b)(1) of the Act, 21 U.S.C.section 360bbb-3(b)(1), unless the authorization is terminated  or revoked sooner.       Influenza A by PCR NEGATIVE NEGATIVE Final   Influenza B by PCR NEGATIVE NEGATIVE Final    Comment: (NOTE) The Xpert Xpress SARS-CoV-2/FLU/RSV plus assay is intended as an aid in the diagnosis of influenza from Nasopharyngeal swab specimens and should not be used as a sole basis for treatment. Nasal washings and aspirates are unacceptable for Xpert Xpress SARS-CoV-2/FLU/RSV testing.  Fact Sheet for Patients: BloggerCourse.com  Fact Sheet for Healthcare Providers: SeriousBroker.it  This test is not yet approved or cleared by the [place] FDA and has been authorized for detection and/or diagnosis of SARS-CoV-2 by FDA under an Emergency Use Authorization (EUA). This EUA will remain in effect (meaning this test can be used) for the duration of the COVID-19 declaration under Section 564(b)(1) of the Act, 21 U.S.C. section 360bbb-3(b)(1), unless the authorization is terminated or revoked.     Resp Syncytial Virus by PCR NEGATIVE NEGATIVE Final    Comment: (NOTE) Fact Sheet for Patients: BloggerCourse.com  Fact Sheet for Healthcare  Providers: SeriousBroker.it  This test  is not yet approved or cleared by the [place] and has been authorized for detection and/or diagnosis of SARS-CoV-2 by FDA under an Emergency Use Authorization (EUA). This EUA will remain in effect (meaning this test can be used) for the duration of the COVID-19 declaration under Section 564(b)(1) of the Act, 21 U.S.C. section 360bbb-3(b)(1), unless the authorization is terminated or revoked.  Performed at [facility], [address] [place], [place] [address]   Urine Culture     Status: Abnormal   Collection Time: 02/27/23 10:46 PM   Specimen: Urine, Catheterized  Result Value Ref Range Status   Specimen Description   Final    URINE, CATHETERIZED Performed at [facility], [address] [place], [place] [address]    Special Requests   Final    NONE Performed at [facility], [address], [place], [place] [address]    Culture (A)  Final    >=100,000 COLONIES/mL ENTEROCOCCUS FAECALIS 20,000 COLONIES/mL ESCHERICHIA COLI Confirmed Extended Spectrum Beta-Lactamase Producer (ESBL).  In bloodstream infections from ESBL organisms, carbapenems are preferred over piperacillin/tazobactam. They are shown to have a lower risk of mortality.    Report Status 03/03/2023 FINAL  Final   Organism ID, Bacteria ENTEROCOCCUS FAECALIS (A)  Final   Organism ID, Bacteria ESCHERICHIA COLI (A)  Final      Susceptibility   Escherichia coli - MIC*    AMPICILLIN >=32 RESISTANT Resistant     CEFAZOLIN >=64 RESISTANT Resistant     CEFEPIME >=32 RESISTANT Resistant     CEFTRIAXONE >=64 RESISTANT Resistant     CIPROFLOXACIN >=4 RESISTANT Resistant     GENTAMICIN <=1 SENSITIVE Sensitive     IMIPENEM <=0.25 SENSITIVE Sensitive     NITROFURANTOIN <=16 SENSITIVE Sensitive     TRIMETH/SULFA >=320 RESISTANT Resistant     AMPICILLIN/SULBACTAM >=32 RESISTANT Resistant      PIP/TAZO 16 SENSITIVE Sensitive     * 20,000 COLONIES/mL ESCHERICHIA COLI   Enterococcus faecalis - MIC*    AMPICILLIN <=2 SENSITIVE Sensitive     NITROFURANTOIN <=16 SENSITIVE Sensitive     VANCOMYCIN 1 SENSITIVE Sensitive     * >=100,000 COLONIES/mL ENTEROCOCCUS FAECALIS    Radiology Studies: No results found.  Scheduled Meds:  baclofen  10 mg Oral Q1500   baclofen  15 mg Oral Daily   baclofen  20 mg Oral QHS   Dimethyl Fumarate  240 mg Oral BID   DULoxetine  90 mg Oral Daily   fesoterodine  4 mg Oral Daily   gabapentin  200 mg Oral TID   heparin  5,000 Units Subcutaneous Q8H   insulin aspart  0-5 Units Subcutaneous QHS   insulin aspart  0-9 Units Subcutaneous TID WC   magnesium oxide  400 mg Oral BID   methimazole  5 mg Oral Q0600   metoprolol succinate  50 mg Oral QPC breakfast   pantoprazole  40 mg Oral Q0600   predniSONE  2.5 mg Oral QODAY   predniSONE  5 mg Oral QODAY   Continuous Infusions:  ceFEPime (MAXIPIME) IV 2 g (03/03/23 9147)   vancomycin 750 mg (03/02/23 2115)     LOS: 3 days    Time spent: 35 mins    Willeen Niece, MD [facility]   If 7PM-7AM, please contact night-coverage

## 2023-03-04 DIAGNOSIS — R6511 Systemic inflammatory response syndrome (SIRS) of non-infectious origin with acute organ dysfunction: Secondary | ICD-10-CM | POA: Diagnosis not present

## 2023-03-04 LAB — GLUCOSE, CAPILLARY
Glucose-Capillary: 98 mg/dL (ref 70–99)
Glucose-Capillary: 170 mg/dL — ABNORMAL HIGH (ref 70–99)
Glucose-Capillary: 142 mg/dL — ABNORMAL HIGH (ref 70–99)
Glucose-Capillary: 193 mg/dL — ABNORMAL HIGH (ref 70–99)

## 2023-03-04 LAB — CULTURE, BLOOD (ROUTINE X 2)
Culture: NO GROWTH
Culture: NO GROWTH
Special Requests: ADEQUATE

## 2023-03-04 LAB — MRSA NEXT GEN BY PCR, NASAL: MRSA by PCR Next Gen: DETECTED — AB

## 2023-03-04 NOTE — [place] FL2 (Signed)
[place] MEDICAID FL2 LEVEL OF CARE FORM     IDENTIFICATION  Patient Name: Laura Roberts Birthdate: [birth year] Sex: female Admission Date (Current Location): 02/27/2023  [place] and [place] Number:  Haynes Bast [id number] Facility and Address:  [facility],  [address], [place] [address]      Provider Number: [id number]  Attending Physician Name and Address:  Willeen Niece, MD  Relative Name and Phone Number:  Syenna, [place] [phone]  [phone]  Onisha, [place] [phone]  [phone]    Current Level of Care: Hospital Recommended Level of Care: Nursing Facility Prior Approval Number:    Date Approved/Denied:   PASRR Number: [id number]  Discharge Plan: SNF    Current Diagnoses: Patient Active Problem List   Diagnosis Date Noted   SIRS of non-infectious origin w acute organ dysfunction (HCC) 02/28/2023   S/P colostomy (HCC) 02/28/2023   DNR (do not resuscitate)/DNI(Do not intubate) 02/28/2023   Decubitus ulcer of right ischial area, unspecified pressure ulcer stage 10/16/2021   Left perineal ischial pressure ulcer 09/18/2021   Low serum creatine 06/08/2021   Pressure injury of skin 04/06/2021   Sacral osteomyelitis (HCC) 04/05/2021   Stage 4 decubitus ulcer (HCC) 04/05/2021   Chronic osteomyelitis (HCC) 04/05/2021   Multinodular goiter 04/05/2021   Anemia in other chronic diseases classified elsewhere 04/05/2021   Chronic respiratory failure (HCC) 04/05/2021   Protein-calorie malnutrition (HCC) 08/01/2020   Pruritus of scalp 04/13/2020   Essential (hemorrhagic) thrombocythemia (HCC) 10/28/2019   Recurrent depression (HCC) 01/14/2019   Decubitus ulcer 01/31/2018   Hyperthyroidism 01/14/2018   Chronic suprapubic catheter (HCC) 10/29/2017   Type 2 diabetes mellitus with diabetic neuropathy, with long-term current use of insulin (HCC)    Paraplegia (HCC) 06/01/2014   Encounter for therapeutic drug monitoring 01/05/2014   Foot  drop, bilateral 10/13/2012   Neurogenic bladder 09/02/2012   Neurogenic pain of lower extremity 09/02/2012   Essential hypertension 07/21/2012   HLD (hyperlipidemia) 07/21/2012   Tachycardia, unspecified 07/21/2012   GERD (gastroesophageal reflux disease) 07/21/2012   Multiple sclerosis (HCC)     Orientation RESPIRATION BLADDER Height & Weight     Self, Time, Situation, Place  Normal Continent Weight: 175 lb 15.5 oz (79.8 kg) Height:  5\' 2"  (157.5 cm)  BEHAVIORAL SYMPTOMS/MOOD NEUROLOGICAL BOWEL NUTRITION STATUS      Continent Diet  AMBULATORY STATUS COMMUNICATION OF NEEDS Skin   Total Care Verbally PU Stage and Appropriate Care       PU Stage 4 Dressing: Daily               Personal Care Assistance Level of Assistance  Bathing, Feeding, Dressing Bathing Assistance: Maximum assistance Feeding assistance: Independent Dressing Assistance: Maximum assistance     Functional Limitations Info  Sight, Hearing, Speech Sight Info: Adequate Hearing Info: Adequate Speech Info: Adequate    SPECIAL CARE FACTORS FREQUENCY                       Contractures Contractures Info: Not present    Additional Factors Info  Code Status Code Status Info: Limited: Do not attempt resuscitation (DNR) -DNR-LIMITED -Do Not Intubate/DNI             Current Medications (03/04/2023):  This is the current hospital active medication list Current Facility-Administered Medications  Medication Dose Route Frequency Provider Last Rate Last Admin   acetaminophen (TYLENOL) tablet 650 mg  650 mg Oral Q6H PRN Carollee Herter, DO   650 mg at 03/01/23 0011  Or   acetaminophen (TYLENOL) suppository 650 mg  650 mg Rectal Q6H PRN Carollee Herter, DO       baclofen (LIORESAL) tablet 10 mg  10 mg Oral Q1500 Carollee Herter, DO   10 mg at 03/04/23 1544   baclofen (LIORESAL) tablet 15 mg  15 mg Oral Daily Carollee Herter, DO   15 mg at 03/04/23 0946   baclofen (LIORESAL) tablet 20 mg  20 mg Oral QHS Carollee Herter, DO   20  mg at 03/03/23 2136   ceFEPIme (MAXIPIME) 2 g in sodium chloride 0.9 % 100 mL IVPB  2 g Intravenous Q12H Poindexter, Leann T, RPH 200 mL/hr at 03/04/23 0747 2 g at 03/04/23 0747   Dimethyl Fumarate CPDR 240 mg  240 mg Oral BID Carollee Herter, DO   240 mg at 03/04/23 1019   DULoxetine (CYMBALTA) DR capsule 90 mg  90 mg Oral Daily Carollee Herter, DO   90 mg at 03/04/23 0946   fesoterodine (TOVIAZ) tablet 4 mg  4 mg Oral Daily Carollee Herter, DO   4 mg at 03/04/23 7829   gabapentin (NEURONTIN) capsule 200 mg  200 mg Oral TID Carollee Herter, DO   200 mg at 03/04/23 1544   heparin injection 5,000 Units  5,000 Units Subcutaneous Q8H Carollee Herter, DO   5,000 Units at 03/04/23 1544   insulin aspart (novoLOG) injection 0-5 Units  0-5 Units Subcutaneous QHS Carollee Herter, DO   2 Units at 03/02/23 2113   insulin aspart (novoLOG) injection 0-9 Units  0-9 Units Subcutaneous TID WC Carollee Herter, DO   1 Units at 03/04/23 1740   magnesium oxide (MAG-OX) tablet 400 mg  400 mg Oral BID Carollee Herter, DO   400 mg at 03/04/23 5621   melatonin tablet 10 mg  10 mg Oral QHS PRN Carollee Herter, DO   10 mg at 03/01/23 2232   methimazole (TAPAZOLE) tablet 5 mg  5 mg Oral Q0600 Carollee Herter, DO   5 mg at 03/04/23 0530   metoprolol succinate (TOPROL-XL) 24 hr tablet 50 mg  50 mg Oral QPC breakfast Carollee Herter, DO   50 mg at 03/04/23 0947   ondansetron (ZOFRAN) tablet 4 mg  4 mg Oral Q6H PRN Carollee Herter, DO       Or   ondansetron [facility]) injection 4 mg  4 mg Intravenous Q6H PRN Carollee Herter, DO       pantoprazole (PROTONIX) EC tablet 40 mg  40 mg Oral Q0600 Carollee Herter, DO   40 mg at 03/04/23 0530   predniSONE (DELTASONE) tablet 2.5 mg  2.5 mg Oral Doreen Beam, Adisa Litt, DO   2.5 mg at 03/03/23 1016   predniSONE (DELTASONE) tablet 5 mg  5 mg Oral Doreen Beam, Sarea Fyfe, DO   5 mg at 03/04/23 0947   vancomycin (VANCOREADY) IVPB 750 mg/150 mL  750 mg Intravenous Q24H Donell Beers, RPH 150 mL/hr at 03/03/23 2138 750 mg at 03/03/23 2138     Discharge  Medications: Please see discharge summary for a list of discharge medications.  Relevant Imaging Results:  Relevant Lab Results:   Additional Information SSN [id number]  Darleene Cleaver, LCSW

## 2023-03-04 NOTE — TOC Initial Note (Addendum)
Transition of Care [facility]) - Initial/Assessment Note    Patient Details  Name: Laura Roberts MRN: [id number] Date of Birth: September 20, 1940  Transition of Care [facility]) CM/SW Contact:    Darleene Cleaver, LCSW Phone Number: 03/04/2023, 6:30 PM  Clinical Narrative:      Patient is an 82 year old female who is a LTC resident at [facility].  Patient plans to return back to the facility once she is medically clear.  Per Sonny Dandy she has been there several months and can accept her back once she is medically ready for discharge.  Patient is paraplegic and unable to ambulate on her own.  Patient is currently alert and oriented x4.  Patient is on contact precautions due to ESBL and MRSA.  Patient is currently receiving IV antibiotics.  TOC to continue to follow patient's progress throughout discharge planning.  Expected Discharge Plan: Long Term Nursing Home Barriers to Discharge: Continued Medical Work up   Patient Goals and CMS Choice Patient states their goals for this hospitalization and ongoing recovery are:: To return to [facility] under LTC. CMS Medicare.gov Compare Post Acute Care list provided to:: Patient Choice offered to / list presented to : Patient  ownership interest in [facility].provided to:: Patient    Expected Discharge Plan and Services     Post Acute Care Choice: Nursing Home Living arrangements for the past 2 months: Skilled Nursing Facility                                      Prior Living Arrangements/Services Living arrangements for the past 2 months: Skilled Nursing Facility Lives with:: Facility Resident Patient language and need for interpreter reviewed:: Yes Do you feel safe going back to the place where you live?: Yes      Need for Family Participation in Patient Care: Yes (Comment) Care giver support system in place?: Yes (comment)   Criminal Activity/Legal Involvement Pertinent to Current Situation/Hospitalization: No - Comment as  needed  Activities of Daily Living Home Assistive Devices/Equipment: Other (Comment) ADL Screening (condition at time of admission) Is the patient deaf or have difficulty hearing?: No Does the patient have difficulty seeing, even when wearing glasses/contacts?: No Does the patient have difficulty concentrating, remembering, or making decisions?: No Does the patient have difficulty dressing or bathing?: No Does the patient have difficulty walking or climbing stairs?: No  Permission Sought/Granted Permission sought to share information with : Case Manager, Magazine features editor, Family Supports Permission granted to share information with : Yes, Release of Information Signed  Share Information with NAME: Leilanie, Cregan Son [phone]  [phone]  Yanna, [place] [phone]  [phone]  Permission granted to share info w AGENCY: SNF admissions        Emotional Assessment Appearance:: Appears stated age   Affect (typically observed): Accepting, Appropriate, Calm Orientation: : Oriented to Self, Oriented to Place, Oriented to  Time, Oriented to Situation Alcohol / Substance Use: Not Applicable Psych Involvement: No (comment)  Admission diagnosis:  Other osteomyelitis, unspecified site (HCC) [M86.8X9] SIRS of non-infectious origin w acute organ dysfunction (HCC) [R65.11] Pressure injury of sacral region, stage 2 (HCC) [L89.152] Sepsis, due to unspecified organism, unspecified whether acute organ dysfunction present [facility]) [A41.9] Patient Active Problem List   Diagnosis Date Noted   SIRS of non-infectious origin w acute organ dysfunction (HCC) 02/28/2023   S/P colostomy (HCC) 02/28/2023   DNR (do not resuscitate)/DNI(Do not  intubate) 02/28/2023   Decubitus ulcer of right ischial area, unspecified pressure ulcer stage 10/16/2021   Left perineal ischial pressure ulcer 09/18/2021   Low serum creatine 06/08/2021   Pressure injury of skin 04/06/2021   Sacral  osteomyelitis (HCC) 04/05/2021   Stage 4 decubitus ulcer (HCC) 04/05/2021   Chronic osteomyelitis (HCC) 04/05/2021   Multinodular goiter 04/05/2021   Anemia in other chronic diseases classified elsewhere 04/05/2021   Chronic respiratory failure (HCC) 04/05/2021   Protein-calorie malnutrition (HCC) 08/01/2020   Pruritus of scalp 04/13/2020   Essential (hemorrhagic) thrombocythemia (HCC) 10/28/2019   Recurrent depression (HCC) 01/14/2019   Decubitus ulcer 01/31/2018   Hyperthyroidism 01/14/2018   Chronic suprapubic catheter (HCC) 10/29/2017   Type 2 diabetes mellitus with diabetic neuropathy, with long-term current use of insulin (HCC)    Paraplegia (HCC) 06/01/2014   Encounter for therapeutic drug monitoring 01/05/2014   Foot drop, bilateral 10/13/2012   Neurogenic bladder 09/02/2012   Neurogenic pain of lower extremity 09/02/2012   Essential hypertension 07/21/2012   HLD (hyperlipidemia) 07/21/2012   Tachycardia, unspecified 07/21/2012   GERD (gastroesophageal reflux disease) 07/21/2012   Multiple sclerosis (HCC)    PCP:  Marinda Elk, MD Pharmacy:   [facility] [place], [place] - [address] [place] [address] Phone: [phone] Fax: [phone]     Social Determinants of Health (SDOH) Social History: SDOH Screenings   Food Insecurity: No Food Insecurity (02/28/2023)  Housing: Low Risk  (02/28/2023)  Transportation Needs: No Transportation Needs (02/28/2023)  Utilities: Not At Risk (02/28/2023)  Depression (PHQ2-9): Low Risk  (01/22/2022)  Financial Resource Strain: Low Risk  (11/13/2017)  Physical Activity: Sufficiently Active (11/13/2017)  Social Connections: Moderately Isolated (11/13/2017)  Stress: Stress Concern Present (11/13/2017)  Tobacco Use: Low Risk  (02/27/2023)   SDOH Interventions:     Readmission Risk Interventions    04/11/2021    1:05 PM  Readmission Risk Prevention Plan   PCP or Specialist Appt within 3-5 Days Complete  HRI or Home Care Consult Complete  Social Work Consult for Recovery Care Planning/Counseling Complete  Palliative Care Screening Not Applicable  Medication Review Oceanographer) Complete

## 2023-03-04 NOTE — Progress Notes (Signed)
MEWS Progress Note  Patient Details Name: Laura Roberts MRN: [id number] DOB: January 25, 1941 Today's Date: 03/04/2023   MEWS Flowsheet Documentation:  Assess: MEWS Score Temp: 98.5 F (36.9 C) BP: (!) 157/97 MAP (mmHg): 115 Pulse Rate: (!) 114 ECG Heart Rate: (!) 103 Resp: 15 Level of Consciousness: Alert SpO2: 97 % O2 Device: Room Air O2 Flow Rate (L/min): 2 L/min Assess: MEWS Score MEWS Temp: 0 MEWS Systolic: 0 MEWS Pulse: 2 MEWS RR: 0 MEWS LOC: 0 MEWS Score: 2 MEWS Score Color: Yellow Assess: SIRS CRITERIA SIRS Temperature : 0 SIRS Respirations : 0 SIRS Pulse: 1 SIRS WBC: 0 SIRS Score Sum : 1 SIRS Temperature : 0 SIRS Pulse: 1 SIRS Respirations : 0 SIRS WBC: 0 SIRS Score Sum : 1 Assess: if the MEWS score is Yellow or Red Were vital signs accurate and taken at a resting state?: Yes Does the patient meet 2 or more of the SIRS criteria?: No MEWS guidelines implemented : Yes, yellow Treat MEWS Interventions: Considered administering scheduled or prn medications/treatments as ordered Take Vital Signs Increase Vital Sign Frequency : Yellow: Q2hr x1, continue Q4hrs until patient remains green for 12hrs Escalate MEWS: Escalate: Yellow: Discuss with charge nurse and consider notifying provider and/or RRT Notify: Charge Nurse/RN Name of Charge Nurse/RN Notified: Electrical engineer Provider Notification Provider Name/Title: Idelle Leech Date Provider Notified: 03/04/23 Time Provider Notified: [phone] Method of Notification: Rounds Notification Reason: Change in status Provider response: At bedside Date of Provider Response: 03/04/23 Time of Provider Response: 0950 Notify: Rapid Response Name of Rapid Response RN Notified: No Date Rapid Response Notified: 03/04/23 Time Rapid Response Notified: [id number]      Charmayne Sheer 03/04/2023, 10:26 AM

## 2023-03-04 NOTE — Progress Notes (Signed)
PROGRESS NOTE    Laura Roberts  ZOX:[id number] DOB: 1940-07-02 DOA: 02/27/2023 PCP: Marinda Elk, MD   Brief Narrative:  This 82 years old female with PMH significant of multiple sclerosis with paraplegia, bedbound status, history of hypertension, type 2 diabetes on insulin, chronic sacral decubitus ulcer POA, chronic sacral osteomyelitis POA, history of colostomy, history of chronic suprapubic catheter, history of chronic steroid use presented in the ED with left-sided weakness.  EMS was called for patient having left-sided weakness interpreted as a stroke.  Patient has left-sided weakness from multiple sclerosis.  Workup in the ED.  CT head negative for acute infarct, CT chest ruled out pulmonary embolism.  CT abdomen and pelvis no acute intra-abdominal or intrapelvic abnormalities noted.  Patient is admitted for sepsis with unknown cause.  Patient does have chronic decubitus ulcer which does not appear infected.  Patient started on empiric antibiotics.  Procalcitonin is 40.1. Patient seems improved.   Assessment & Plan:   Principal Problem:   SIRS of non-infectious origin w acute organ dysfunction (HCC) Active Problems:   Multiple sclerosis (HCC)   Essential hypertension   Paraplegia (HCC)   Type 2 diabetes mellitus with diabetic neuropathy, with long-term current use of insulin (HCC)   Chronic suprapubic catheter (HCC)   Decubitus ulcer   Chronic osteomyelitis (HCC)   S/P colostomy (HCC)   DNR (do not resuscitate)/DNI(Do not intubate)  Sepsis of unknown etiology: Patient presented with fever,  tachycardia, lactic acid 2.4, procalcitonin 40.1. CTA negative for PE. CT abd showed chronic osteomyelitis of sacrum.  Pt's decub ulcers are healing well and without any drainage.  Doesn't seem infected. Initiated on empiric antibiotics ( IV cefepime and vanco). Covid is negative.  Do not have a source for infection but pt was still febrile and tachycardic.  Procalcitonin 40.1.  Continue vancomycin and cefepime . Blood cultures: No growth so far, Urine cultures grew Enterobacter/ E .coli. Discussed with pharmacy 7 days of antibiotics course is enough. She does not need antibiotics at discharge.   S/P colostomy (HCC) Chronic. Continue colostomy care.   Chronic osteomyelitis (HCC) Stable. Doubt this is the source of her fever/tachycardia.   Decubitus ulcer: Chronic. Presents on admission. Wounds without any drainage.  RN reports that family said that pt's wounds are healing nicely.  Chronic suprapubic catheter (HCC) Chronic. UA negative.  Urine culture grew Enterobacter. No antibiotics at discharge.   Type 2 diabetes with diabetic neuropathy: Stable.  Continue regular insulin sliding scale.   Paraplegia (HCC) Chronic. Due to MS.   Essential hypertension Stable. Continue with toprol-xl. Hold lasix while pt is febrile.   Multiple sclerosis (HCC) Chronic. Follows with neurology. She is  bedbound.   DNR (do not resuscitate)/DNI(Do not intubate) Reviewed DNR and most form.  DVT prophylaxis: Heparin sq Code Status: DNR Family Communication: No family at bed side Disposition Plan:    Status is: Inpatient Remains inpatient appropriate because: Admitted for sepsis of unknown etiology, continued on IV antibiotics.  Blood cultures no growth so far, urine culture grew Enterobacter, finding  out duration of antibiotics. Patient wants to be discharged home tomorrow.   Consultants:  None  Procedures:  Antimicrobials:  Anti-infectives (From admission, onward)    Start     Dose/Rate Route Frequency Ordered Stop   02/28/23 2100  vancomycin (VANCOREADY) IVPB 750 mg/150 mL        750 mg 150 mL/hr over 60 Minutes Intravenous Every 24 hours 02/28/23 0610     02/28/23 0800  ceFEPIme (MAXIPIME) 2 g in sodium chloride 0.9 % 100 mL IVPB        2 g 200 mL/hr over 30 Minutes Intravenous Every 12 hours 02/28/23 0610     02/27/23 2015  ceFEPIme (MAXIPIME) 2 g in  sodium chloride 0.9 % 100 mL IVPB        2 g 200 mL/hr over 30 Minutes Intravenous  Once 02/27/23 2005 02/27/23 2047   02/27/23 2015  metroNIDAZOLE (FLAGYL) IVPB 500 mg        500 mg 100 mL/hr over 60 Minutes Intravenous  Once 02/27/23 2005 02/27/23 2153   02/27/23 2015  vancomycin (VANCOCIN) IVPB 1000 mg/200 mL premix  Status:  Discontinued        1,000 mg 200 mL/hr over 60 Minutes Intravenous  Once 02/27/23 2005 02/27/23 2010   02/27/23 2015  vancomycin (VANCOREADY) IVPB 1500 mg/300 mL        1,500 mg 150 mL/hr over 120 Minutes Intravenous  Once 02/27/23 2011 02/28/23 0011      Subjective: Patient was seen and examined at bedside.  Overnight events noted.   Patient reports doing better.  She remains afebrile since she is here. Denies any other concerns.  She denies any urinary burning. She wants to be discharged tomorrow.  Objective: Vitals:   03/03/23 1942 03/04/23 0528 03/04/23 0942 03/04/23 1157  BP: (!) 144/70 (!) 142/73 (!) 157/97 139/87  Pulse: 97 92 (!) 114 (!) 104  Resp: 18 18 15 18   Temp: 98.4 F (36.9 C) 98.6 F (37 C) 98.5 F (36.9 C) 98.3 F (36.8 C)  TempSrc: Oral Oral  Oral  SpO2: 97% 98% 97% 94%  Weight:      Height:        Intake/Output Summary (Last 24 hours) at 03/04/2023 1514 Last data filed at 03/04/2023 0536 Gross per 24 hour  Intake 587 ml  Output 2000 ml  Net -1413 ml   Filed Weights   02/27/23 1940 03/01/23 0500 03/02/23 0434  Weight: 79.8 kg 79.8 kg 79.8 kg    Examination:  General exam: Appears comfortable, deconditioned, not in any acute distress. Respiratory system: CTA bilaterally. Respiratory effort normal. RR 15 Cardiovascular system: S1 & S2 heard, RRR. No JVD, murmurs, rubs, gallops or clicks. No pedal edema. Gastrointestinal system: Abdomen is non distended, soft and non tender. Normal bowel sounds heard. Colostomy noted. Central nervous system: Alert and oriented x 3.  Extremities: No edema, no cyanosis, no clubbing. Skin:  Chronic sacral decubitus ulcer healing. Psychiatry: Judgement and insight appear normal. Mood & affect appropriate.     Data Reviewed: I have personally reviewed following labs and imaging studies  CBC: Recent Labs  Lab 02/27/23 2000 02/27/23 2014 02/28/23 0848 03/01/23 0643 03/02/23 0605  WBC 14.5*  --  20.4* 11.7* 9.2  NEUTROABS 13.7*  --  17.0*  --   --   HGB 13.7 14.6 11.3* 10.8* 10.9*  HCT 43.0 43.0 36.1 33.7* 35.0*  MCV 92.9  --  93.8 94.9 93.8  PLT 228  --  214 209 217   Basic Metabolic Panel: Recent Labs  Lab 02/27/23 2000 02/27/23 2014 02/28/23 0848 03/01/23 0643 03/02/23 0605  NA 137 137 137 140 139  K 4.4 4.1 4.0 3.3* 4.7  CL 97* 100 105 106 107  CO2 23  --  23 25 24   GLUCOSE 198* 208* 112* 100* 90  BUN 26* 27* 17 9 6*  CREATININE 0.55 0.60 0.49 0.50 0.42*  CALCIUM 9.4  --  8.1* 8.8* 8.9  MG  --   --   --  1.9 1.9  PHOS  --   --   --  2.6 2.6   GFR: Estimated Creatinine Clearance: 54.9 mL/min (A) (by C-G formula based on SCr of 0.42 mg/dL (L)). Liver Function Tests: Recent Labs  Lab 02/27/23 2000 02/28/23 0848  AST 58* 33  ALT 35 32  ALKPHOS 80 63  BILITOT 1.6* 1.2  PROT 7.4 6.0*  ALBUMIN 3.8 2.8*   No results for input(s): "LIPASE", "AMYLASE" in the last 168 hours. No results for input(s): "AMMONIA" in the last 168 hours. Coagulation Profile: Recent Labs  Lab 02/27/23 2000  INR 1.0   Cardiac Enzymes: No results for input(s): "CKTOTAL", "CKMB", "CKMBINDEX", "TROPONINI" in the last 168 hours. BNP (last 3 results) No results for input(s): "PROBNP" in the last 8760 hours. HbA1C: No results for input(s): "HGBA1C" in the last 72 hours.  CBG: Recent Labs  Lab 03/03/23 1201 03/03/23 1641 03/03/23 2031 03/04/23 0737 03/04/23 1155  GLUCAP 115* 135* 189* 98 170*   Lipid Profile: No results for input(s): "CHOL", "HDL", "LDLCALC", "TRIG", "CHOLHDL", "LDLDIRECT" in the last 72 hours. Thyroid Function Tests: No results for input(s):  "TSH", "T4TOTAL", "FREET4", "T3FREE", "THYROIDAB" in the last 72 hours. Anemia Panel: No results for input(s): "VITAMINB12", "FOLATE", "FERRITIN", "TIBC", "IRON", "RETICCTPCT" in the last 72 hours. Sepsis Labs: Recent Labs  Lab 02/27/23 2034 02/27/23 2200 02/27/23 2213 02/28/23 0848  PROCALCITON  --  15.97  --  40.88  LATICACIDVEN 2.4*  --  2.2*  --     Recent Results (from the past 240 hour(s))  Respiratory (~20 pathogens) panel by PCR     Status: None   Collection Time: 02/27/23  1:06 AM   Specimen: Nasopharyngeal Swab; Respiratory  Result Value Ref Range Status   Adenovirus NOT DETECTED NOT DETECTED Final   Coronavirus 229E NOT DETECTED NOT DETECTED Final    Comment: (NOTE) The Coronavirus on the Respiratory Panel, DOES NOT test for the novel  Coronavirus (2019 nCoV)    Coronavirus HKU1 NOT DETECTED NOT DETECTED Final   Coronavirus NL63 NOT DETECTED NOT DETECTED Final   Coronavirus OC43 NOT DETECTED NOT DETECTED Final   Metapneumovirus NOT DETECTED NOT DETECTED Final   Rhinovirus / Enterovirus NOT DETECTED NOT DETECTED Final   Influenza A NOT DETECTED NOT DETECTED Final   Influenza B NOT DETECTED NOT DETECTED Final   Parainfluenza Virus 1 NOT DETECTED NOT DETECTED Final   Parainfluenza Virus 2 NOT DETECTED NOT DETECTED Final   Parainfluenza Virus 3 NOT DETECTED NOT DETECTED Final   Parainfluenza Virus 4 NOT DETECTED NOT DETECTED Final   Respiratory Syncytial Virus NOT DETECTED NOT DETECTED Final   Bordetella pertussis NOT DETECTED NOT DETECTED Final   Bordetella Parapertussis NOT DETECTED NOT DETECTED Final   Chlamydophila pneumoniae NOT DETECTED NOT DETECTED Final   Mycoplasma pneumoniae NOT DETECTED NOT DETECTED Final    Comment: Performed at [facility], [address] [place], [place] [address]  Blood Culture (routine x 2)     Status: None   Collection Time: 02/27/23  8:10 PM   Specimen: BLOOD  Result Value Ref Range Status   Specimen Description   Final     BLOOD BLOOD RIGHT FOREARM Performed at [facility], [address], [place], [place] [address]    Special Requests   Final    BOTTLES DRAWN AEROBIC AND ANAEROBIC Blood Culture adequate volume Performed at [facility], [address] [place], [place] [address]    Culture   Final    NO GROWTH 5 DAYS Performed at [facility], [address] [place], [place] [address]    Report Status 03/04/2023 FINAL  Final  Blood Culture (routine x 2)     Status: None   Collection Time: 02/27/23  8:20 PM   Specimen: BLOOD  Result Value Ref Range Status   Specimen Description   Final    BLOOD RIGHT ANTECUBITAL Performed at [facility], [address], [place], [place] [address]    Special Requests   Final    BOTTLES DRAWN AEROBIC AND ANAEROBIC Blood Culture results may not be optimal due to an excessive volume of blood received in culture bottles Performed at [facility], [address] [place], [place] [address]    Culture   Final    NO GROWTH 5 DAYS Performed at [facility], [address] [place], [place] [address]    Report Status 03/04/2023 FINAL  Final  Resp panel by RT-PCR (RSV, Flu A&B, Covid) Anterior Nasal Swab     Status: None   Collection Time: 02/27/23  8:35 PM   Specimen: Anterior Nasal Swab  Result Value Ref Range Status   SARS Coronavirus 2 by RT PCR NEGATIVE NEGATIVE Final    Comment: (NOTE) SARS-CoV-2 target nucleic acids are NOT DETECTED.  The SARS-CoV-2 RNA is generally detectable in upper respiratory specimens during the acute phase of infection. The lowest concentration of SARS-CoV-2 viral copies this assay can detect is 138 copies/mL. A negative result does not preclude SARS-Cov-2 infection and should not be used as the sole basis for treatment or other patient management decisions. A negative result may occur with  improper specimen collection/handling, submission of  specimen other than nasopharyngeal swab, presence of viral mutation(s) within the areas targeted by this assay, and inadequate number of viral copies(<138 copies/mL). A negative result must be combined with clinical observations, patient history, and epidemiological information. The expected result is Negative.  Fact Sheet for Patients:  BloggerCourse.com  Fact Sheet for Healthcare Providers:  SeriousBroker.it  This test is no t yet approved or cleared by the [place] FDA and  has been authorized for detection and/or diagnosis of SARS-CoV-2 by FDA under an Emergency Use Authorization (EUA). This EUA will remain  in effect (meaning this test can be used) for the duration of the COVID-19 declaration under Section 564(b)(1) of the Act, 21 U.S.C.section 360bbb-3(b)(1), unless the authorization is terminated  or revoked sooner.       Influenza A by PCR NEGATIVE NEGATIVE Final   Influenza B by PCR NEGATIVE NEGATIVE Final    Comment: (NOTE) The Xpert Xpress SARS-CoV-2/FLU/RSV plus assay is intended as an aid in the diagnosis of influenza from Nasopharyngeal swab specimens and should not be used as a sole basis for treatment. Nasal washings and aspirates are unacceptable for Xpert Xpress SARS-CoV-2/FLU/RSV testing.  Fact Sheet for Patients: BloggerCourse.com  Fact Sheet for Healthcare Providers: SeriousBroker.it  This test is not yet approved or cleared by the [place] FDA and has been authorized for detection and/or diagnosis of SARS-CoV-2 by FDA under an Emergency Use Authorization (EUA). This EUA will remain in effect (meaning this test can be used) for the duration of the COVID-19 declaration under Section 564(b)(1) of the Act, 21 U.S.C. section 360bbb-3(b)(1), unless the authorization is terminated or revoked.     Resp Syncytial Virus by  PCR NEGATIVE NEGATIVE Final     Comment: (NOTE) Fact Sheet for Patients: BloggerCourse.com  Fact Sheet for Healthcare Providers: SeriousBroker.it  This test is not yet approved or cleared by the [place] FDA and has been authorized for detection and/or diagnosis of SARS-CoV-2 by FDA under an Emergency Use Authorization (EUA). This EUA will remain in effect (meaning this test can be used) for the duration of the COVID-19 declaration under Section 564(b)(1) of the Act, 21 U.S.C. section 360bbb-3(b)(1), unless the authorization is terminated or revoked.  Performed at [facility], [address], [place], [place] [address]   Urine Culture     Status: Abnormal   Collection Time: 02/27/23 10:46 PM   Specimen: Urine, Catheterized  Result Value Ref Range Status   Specimen Description   Final    URINE, CATHETERIZED Performed at [facility], [address] [place], [place] [address]    Special Requests   Final    NONE Performed at [facility], [address] [place], [place] [address]    Culture (A)  Final    >=100,000 COLONIES/mL ENTEROCOCCUS FAECALIS 20,000 COLONIES/mL ESCHERICHIA COLI Confirmed Extended Spectrum Beta-Lactamase Producer (ESBL).  In bloodstream infections from ESBL organisms, carbapenems are preferred over piperacillin/tazobactam. They are shown to have a lower risk of mortality.    Report Status 03/03/2023 FINAL  Final   Organism ID, Bacteria ENTEROCOCCUS FAECALIS (A)  Final   Organism ID, Bacteria ESCHERICHIA COLI (A)  Final      Susceptibility   Escherichia coli - MIC*    AMPICILLIN >=32 RESISTANT Resistant     CEFAZOLIN >=64 RESISTANT Resistant     CEFEPIME >=32 RESISTANT Resistant     CEFTRIAXONE >=64 RESISTANT Resistant     CIPROFLOXACIN >=4 RESISTANT Resistant     GENTAMICIN <=1 SENSITIVE Sensitive     IMIPENEM <=0.25 SENSITIVE Sensitive     NITROFURANTOIN <=16  SENSITIVE Sensitive     TRIMETH/SULFA >=320 RESISTANT Resistant     AMPICILLIN/SULBACTAM >=32 RESISTANT Resistant     PIP/TAZO 16 SENSITIVE Sensitive     * 20,000 COLONIES/mL ESCHERICHIA COLI   Enterococcus faecalis - MIC*    AMPICILLIN <=2 SENSITIVE Sensitive     NITROFURANTOIN <=16 SENSITIVE Sensitive     VANCOMYCIN 1 SENSITIVE Sensitive     * >=100,000 COLONIES/mL ENTEROCOCCUS FAECALIS  MRSA Next Gen by PCR, Nasal     Status: Abnormal   Collection Time: 03/04/23 11:20 AM   Specimen: Nasal Mucosa; Nasal Swab  Result Value Ref Range Status   MRSA by PCR Next Gen DETECTED (A) NOT DETECTED Final    Comment: (NOTE) The GeneXpert MRSA Assay (FDA approved for NASAL specimens only), is one component of a comprehensive MRSA colonization surveillance program. It is not intended to diagnose MRSA infection nor to guide or monitor treatment for MRSA infections. Test performance is not FDA approved in patients less than 26 years old. Performed at [facility], [address], [place], [place] [address]     Radiology Studies: No results found.  Scheduled Meds:  baclofen  10 mg Oral Q1500   baclofen  15 mg Oral Daily   baclofen  20 mg Oral QHS   Dimethyl Fumarate  240 mg Oral BID   DULoxetine  90 mg Oral Daily   fesoterodine  4 mg Oral Daily   gabapentin  200 mg Oral TID   heparin  5,000 Units Subcutaneous Q8H   insulin aspart  0-5 Units  Subcutaneous QHS   insulin aspart  0-9 Units Subcutaneous TID WC   magnesium oxide  400 mg Oral BID   methimazole  5 mg Oral Q0600   metoprolol succinate  50 mg Oral QPC breakfast   pantoprazole  40 mg Oral Q0600   predniSONE  2.5 mg Oral QODAY   predniSONE  5 mg Oral QODAY   Continuous Infusions:  ceFEPime (MAXIPIME) IV 2 g (03/04/23 0747)   vancomycin 750 mg (03/03/23 2138)     LOS: 4 days    Time spent: 35 mins    Willeen Niece, MD [facility]   If 7PM-7AM, please contact night-coverage

## 2023-03-05 DIAGNOSIS — R6511 Systemic inflammatory response syndrome (SIRS) of non-infectious origin with acute organ dysfunction: Secondary | ICD-10-CM | POA: Diagnosis not present

## 2023-03-05 LAB — GLUCOSE, CAPILLARY
Glucose-Capillary: 75 mg/dL (ref 70–99)
Glucose-Capillary: 125 mg/dL — ABNORMAL HIGH (ref 70–99)

## 2023-03-05 NOTE — Progress Notes (Signed)
Home medication sent with patient - placed in discharge envelope by this RN

## 2023-03-05 NOTE — Discharge Summary (Signed)
Physician Discharge Summary  Laura Roberts ZOX:[id number] DOB: 11/15/1940 DOA: 02/27/2023  PCP: Marinda Elk, MD  Admit date: 02/27/2023 Discharge date: 03/05/2023  Admitted From: SNF Disposition: SNF  Recommendations for Outpatient Follow-up:  Follow up with PCP in 1-2 weeks  Discharge Condition: Stable CODE STATUS: Limited DNR - No compressions, DO NOT INTUBATE  Diet recommendation:    Brief/Interim Summary: This 82 years old female with PMH significant of multiple sclerosis with paraplegia, bedbound status, history of hypertension, type 2 diabetes on insulin, chronic sacral decubitus ulcer POA, chronic sacral osteomyelitis POA, history of colostomy, history of chronic suprapubic catheter, history of chronic steroid use presented in the ED with left-sided weakness.  EMS was called for patient having left-sided weakness interpreted as a stroke.  Patient has left-sided weakness from multiple sclerosis.  Workup in the ED.  CT head negative for acute infarct, CT chest ruled out pulmonary embolism.  CT abdomen and pelvis no acute intra-abdominal or intrapelvic abnormalities noted.  Patient is admitted for sepsis with unknown cause. Patient does have chronic decubitus ulcer which does not appear infected. Patient started on empiric antibiotics.  Procalcitonin is 40.1. Patient seems improved - otherwise stable for discharge.   Discharge Diagnoses:  Principal Problem:   SIRS of non-infectious origin w acute organ dysfunction (HCC) Active Problems:   Multiple sclerosis (HCC)   Essential hypertension   Paraplegia (HCC)   Type 2 diabetes mellitus with diabetic neuropathy, with long-term current use of insulin (HCC)   Chronic suprapubic catheter (HCC)   Decubitus ulcer   Chronic osteomyelitis (HCC)   S/P colostomy (HCC)   DNR (do not resuscitate)/DNI(Do not intubate)  Unspecified febrile illness SIRS, POA: SEPSIS RULED OUT - no source ever identified. While presented with fever,   tachycardia, lactic acid 2.4, procalcitonin 40 there have been no identifiable sources for acute infection since admission despite multiple images/cultures/labs. *Patient does have known chronic osteomyelitis of the sacrum but appears to be well healing without overt signs of infection. Completed 7 days of vancomycin and cefepime for unspecified infection-no further antibiotics indicated. Blood cultures showing no growth so far, Urine cultures grew Enterobacter/ E .coli in limited quantity.   S/P colostomy (HCC) Chronic. Continue colostomy care.   Chronic osteomyelitis (HCC), stable  Decubitus ulcer, healing: Stable. Doubt this is the source of her fever/tachycardia. Chronic. Presents on admission. Wounds without any drainage.  RN reports that family said that pt's wounds are healing nicely.   Chronic suprapubic catheter (HCC) Chronic. UA negative.  Urine culture grew Enterobacter. No antibiotics at discharge.   Type 2 diabetes with diabetic neuropathy: Stable.  Continue regular insulin sliding scale.   Paraplegia (HCC) Chronic. Due to MS.   Essential hypertension Stable. Continue with toprol-xl. Hold lasix while pt is febrile.   Multiple sclerosis (HCC) Chronic. Follows with neurology. She is  bedbound.   DNR (do not resuscitate/no chest compressions)/DNI(Do not intubate)  Discharge Instructions   Allergies as of 03/05/2023       Reactions   Codeine Shortness Of Breath, Other (See Comments)   Difficult breathing and skin problem   Ultram [tramadol] Shortness Of Breath, Other (See Comments)   Difficult breathing and skin peeling   Januvia [sitagliptin] Rash, Other (See Comments)   Blisters        Medication List     TAKE these medications    acetaminophen 325 MG tablet Commonly known as: TYLENOL Take 650 mg by mouth every 6 (six) hours as needed for mild pain or  fever.   ascorbic acid 500 MG tablet Commonly known as: VITAMIN C Take 500 mg by mouth in the  morning and at bedtime. (1000 & 2200)   bacitracin 500 UNIT/GM ointment Apply 1 Application topically See admin instructions. Apply to suprapubic catheter site with dressing   baclofen 10 MG tablet Commonly known as: LIORESAL Take 10-20 mg by mouth See admin instructions. Take 15 mg by mouth in the morning, 10 mg midday, and 20 mg at bedtime   barrier cream Crea Commonly known as: non-specified Apply 1 application  topically See admin instructions. Apply to right ischium and bilateral buttocks 2 times a day   bisacodyl 10 MG suppository Commonly known as: DULCOLAX Place 10 mg rectally daily as needed for moderate constipation (constipation not relieved by milk of magnesium).   calcium citrate 950 (200 Ca) MG tablet Commonly known as: CALCITRATE - dosed in mg elemental calcium Take 200 mg of elemental calcium by mouth daily. (0800)   cholecalciferol 25 MCG (1000 UNIT) tablet Commonly known as: VITAMIN D3 Take 1,000 Units by mouth daily. (1000)   Dimethyl Fumarate 240 MG Cpdr One po bid What changed:  how much to take how to take this when to take this additional instructions   DULoxetine 30 MG capsule Commonly known as: CYMBALTA Take 90 mg by mouth daily.   feeding supplement (PRO-STAT SUGAR FREE 64) Liqd Take 30 mLs by mouth 2 (two) times daily.   FeroSul 325 (65 Fe) MG tablet Generic drug: ferrous sulfate Take 325 mg by mouth 2 (two) times daily with a meal.   furosemide 20 MG tablet Commonly known as: LASIX Take 20 mg by mouth every other day.   gabapentin 100 MG capsule Commonly known as: NEURONTIN Take 200 mg by mouth 3 (three) times daily.   magnesium hydroxide 400 MG/5ML suspension Commonly known as: MILK OF MAGNESIA Take 30 mLs by mouth daily as needed for mild constipation.   magnesium oxide 400 MG tablet Commonly known as: MAG-OX Take 400 mg by mouth 2 (two) times daily. (1000 & 2200)   methimazole 5 MG tablet Commonly known as: TAPAZOLE Take 5  mg by mouth daily at 6 (six) AM.   metoprolol succinate 50 MG 24 hr tablet Commonly known as: TOPROL-XL Take 50 mg by mouth daily after breakfast. (0800)   multivitamin with minerals Tabs tablet Take 1 tablet by mouth in the morning. (1000)   NON FORMULARY Take 120 mLs by mouth See admin instructions. MedPass- Drink 120 ml's by mouth three times a day if Glucerna id not available   ondansetron 4 MG tablet Commonly known as: ZOFRAN Take 4 mg by mouth every 6 (six) hours as needed for nausea.   pantoprazole 40 MG tablet Commonly known as: PROTONIX Take 40 mg by mouth daily at 6 (six) AM. (0600)   polyethylene glycol 17 g packet Commonly known as: MIRALAX / GLYCOLAX Take 17 g by mouth daily.   predniSONE 5 MG tablet Commonly known as: DELTASONE Take 5 mg by mouth every other day. Alternating days with 2.5 mg dose   predniSONE 2.5 MG tablet Commonly known as: DELTASONE Take 2.5 mg by mouth every other day. Alternating days with 5 mg dose   RA SALINE ENEMA RE Place 1 enema rectally daily as needed (severe constipation (not relieved by bisacodyl) AND CALL MD IF NO RELIEF FROM ENEMA).   SF 5000 Plus 1.1 % Crea dental cream Generic drug: sodium fluoride Place 1 application  onto teeth daily  at 8 pm. (2000) Pea size  Spit our excess and do not rinse water   solifenacin 10 MG tablet Commonly known as: VESICARE Take 10 mg by mouth daily.   Trulicity 3 MG/0.5ML Sopn Generic drug: Dulaglutide Inject 3 mg into the skin every Tuesday. (1000)        Allergies  Allergen Reactions   Codeine Shortness Of Breath and Other (See Comments)    Difficult breathing and skin problem   Ultram [Tramadol] Shortness Of Breath and Other (See Comments)    Difficult breathing and skin peeling   Januvia [Sitagliptin] Rash and Other (See Comments)    Blisters    Consultations: None   Procedures/Studies: CT ABDOMEN PELVIS W CONTRAST  Result Date: 02/27/2023 CLINICAL DATA:  Left arm  weakness, diverticulitis, sepsis, shortness of breath EXAM: CT ANGIOGRAPHY CHEST CT ABDOMEN AND PELVIS WITH CONTRAST TECHNIQUE: Multidetector CT imaging of the chest was performed using the standard protocol during bolus administration of intravenous contrast. Multiplanar CT image reconstructions and MIPs were obtained to evaluate the vascular anatomy. Multidetector CT imaging of the abdomen and pelvis was performed using the standard protocol during bolus administration of intravenous contrast. RADIATION DOSE REDUCTION: This exam was performed according to the departmental dose-optimization program which includes automated exposure control, adjustment of the mA and/or kV according to patient size and/or use of iterative reconstruction technique. CONTRAST:  OMNIPAQUE IOHEXOL 350 MG/ML SOLN COMPARISON:  02/27/2023, 05/02/2022 FINDINGS: CTA CHEST FINDINGS Cardiovascular: This is a technically adequate evaluation of the pulmonary vasculature. No filling defects or pulmonary emboli. Mild cardiomegaly without pericardial effusion. No evidence of thoracic aortic aneurysm or dissection. Atherosclerosis of the aorta and coronary vasculature. Mediastinum/Nodes: Heterogeneous asymmetric enlargement of the left lobe thyroid. Segmental areas soft g is noted, nonspecific. No esophageal wall thickening. Trachea is unremarkable. No pathologic adenopathy. Lungs/Pleura: No acute airspace disease, effusion, or pneumothorax. Central airways are patent. Musculoskeletal: No acute or destructive bony abnormalities. Severe degenerative changes of the bilateral shoulders. Progressive severe spondylosis at the thoracolumbar junction, with degenerative anterolisthesis of T11 relative T12. Prior T12 laminectomy and T11 laminotomy. Reconstructed images demonstrate no additional findings. Review of the MIP images confirms the above findings. CT ABDOMEN and PELVIS FINDINGS Hepatobiliary: Coarse benign appearing calcifications are seen  within the ventral aspect right lobe liver. No other focal liver abnormalities. Gallbladder is unremarkable, with expected postsurgical dilatation of the common bile duct. Pancreas: Unremarkable. No pancreatic ductal dilatation or surrounding inflammatory changes. Spleen: Normal in size without focal abnormality. Adrenals/Urinary Tract: Kidneys are unremarkable, with no urinary tract calculi or obstructive uropathy. The adrenals are stable. Bladder is decompressed with a suprapubic catheter, with chronic nonspecific bladder wall thickening. Stomach/Bowel: Loop colostomy within the left mid abdomen. No bowel obstruction or ileus. Colonic diverticulosis without evidence of acute diverticulitis. No bowel wall thickening or inflammatory change. Vascular/Lymphatic: Aortic atherosclerosis. No enlarged abdominal or pelvic lymph nodes. Reproductive: Status post hysterectomy. No adnexal masses. Other: No free fluid or free intraperitoneal gas. Musculoskeletal: Decubitus ulcers are seen within the mid sacral region and overlying the left ischium. There is evidence of chronic osteomyelitis of the sacrum. I do not see any bony destruction at the left ischium. Chronic posttraumatic and postsurgical changes of the bilateral hips. No acute displaced fracture. Reconstructed images demonstrate no additional findings. Review of the MIP images confirms the above findings. IMPRESSION: Chest: 1. No evidence of pulmonary embolus. 2. Cardiomegaly and coronary artery atherosclerosis. 3.  Aortic Atherosclerosis (ICD10-I70.0). Abdomen/pelvis: 1. No acute intra-abdominal or intrapelvic  process. 2. Colonic diverticulosis without diverticulitis. 3. Loop colostomy left mid abdomen.  No bowel obstruction or ileus. 4. Decubitus ulcers overlying the sacrum and left ischium. Evidence of chronic osteomyelitis of the distal sacrum unchanged. 5. Chronic indwelling suprapubic catheter, with nonspecific bladder wall thickening. 6.  Aortic Atherosclerosis  (ICD10-I70.0). Electronically Signed   By: Sharlet Salina M.D.   On: 02/27/2023 22:55   CT Angio Chest PE W and/or Wo Contrast  Result Date: 02/27/2023 CLINICAL DATA:  Left arm weakness, diverticulitis, sepsis, shortness of breath EXAM: CT ANGIOGRAPHY CHEST CT ABDOMEN AND PELVIS WITH CONTRAST TECHNIQUE: Multidetector CT imaging of the chest was performed using the standard protocol during bolus administration of intravenous contrast. Multiplanar CT image reconstructions and MIPs were obtained to evaluate the vascular anatomy. Multidetector CT imaging of the abdomen and pelvis was performed using the standard protocol during bolus administration of intravenous contrast. RADIATION DOSE REDUCTION: This exam was performed according to the departmental dose-optimization program which includes automated exposure control, adjustment of the mA and/or kV according to patient size and/or use of iterative reconstruction technique. CONTRAST:  OMNIPAQUE IOHEXOL 350 MG/ML SOLN COMPARISON:  02/27/2023, 05/02/2022 FINDINGS: CTA CHEST FINDINGS Cardiovascular: This is a technically adequate evaluation of the pulmonary vasculature. No filling defects or pulmonary emboli. Mild cardiomegaly without pericardial effusion. No evidence of thoracic aortic aneurysm or dissection. Atherosclerosis of the aorta and coronary vasculature. Mediastinum/Nodes: Heterogeneous asymmetric enlargement of the left lobe thyroid. Segmental areas soft g is noted, nonspecific. No esophageal wall thickening. Trachea is unremarkable. No pathologic adenopathy. Lungs/Pleura: No acute airspace disease, effusion, or pneumothorax. Central airways are patent. Musculoskeletal: No acute or destructive bony abnormalities. Severe degenerative changes of the bilateral shoulders. Progressive severe spondylosis at the thoracolumbar junction, with degenerative anterolisthesis of T11 relative T12. Prior T12 laminectomy and T11 laminotomy. Reconstructed images  demonstrate no additional findings. Review of the MIP images confirms the above findings. CT ABDOMEN and PELVIS FINDINGS Hepatobiliary: Coarse benign appearing calcifications are seen within the ventral aspect right lobe liver. No other focal liver abnormalities. Gallbladder is unremarkable, with expected postsurgical dilatation of the common bile duct. Pancreas: Unremarkable. No pancreatic ductal dilatation or surrounding inflammatory changes. Spleen: Normal in size without focal abnormality. Adrenals/Urinary Tract: Kidneys are unremarkable, with no urinary tract calculi or obstructive uropathy. The adrenals are stable. Bladder is decompressed with a suprapubic catheter, with chronic nonspecific bladder wall thickening. Stomach/Bowel: Loop colostomy within the left mid abdomen. No bowel obstruction or ileus. Colonic diverticulosis without evidence of acute diverticulitis. No bowel wall thickening or inflammatory change. Vascular/Lymphatic: Aortic atherosclerosis. No enlarged abdominal or pelvic lymph nodes. Reproductive: Status post hysterectomy. No adnexal masses. Other: No free fluid or free intraperitoneal gas. Musculoskeletal: Decubitus ulcers are seen within the mid sacral region and overlying the left ischium. There is evidence of chronic osteomyelitis of the sacrum. I do not see any bony destruction at the left ischium. Chronic posttraumatic and postsurgical changes of the bilateral hips. No acute displaced fracture. Reconstructed images demonstrate no additional findings. Review of the MIP images confirms the above findings. IMPRESSION: Chest: 1. No evidence of pulmonary embolus. 2. Cardiomegaly and coronary artery atherosclerosis. 3.  Aortic Atherosclerosis (ICD10-I70.0). Abdomen/pelvis: 1. No acute intra-abdominal or intrapelvic process. 2. Colonic diverticulosis without diverticulitis. 3. Loop colostomy left mid abdomen.  No bowel obstruction or ileus. 4. Decubitus ulcers overlying the sacrum and left  ischium. Evidence of chronic osteomyelitis of the distal sacrum unchanged. 5. Chronic indwelling suprapubic catheter, with nonspecific bladder wall thickening. 6.  Aortic Atherosclerosis (ICD10-I70.0). Electronically Signed   By: Sharlet Salina M.D.   On: 02/27/2023 22:55   CT HEAD WO CONTRAST  Result Date: 02/27/2023 CLINICAL DATA:  Neuro deficit, acute, stroke suspected EXAM: CT HEAD WITHOUT CONTRAST TECHNIQUE: Contiguous axial images were obtained from the base of the skull through the vertex without intravenous contrast. RADIATION DOSE REDUCTION: This exam was performed according to the departmental dose-optimization program which includes automated exposure control, adjustment of the mA and/or kV according to patient size and/or use of iterative reconstruction technique. COMPARISON:  Brain MR 03/09/21 FINDINGS: Brain: No evidence of acute infarction, hemorrhage, hydrocephalus, extra-axial collection or mass lesion/mass effect. Extensive periventricular hypodensity likely represents sequela multiple sclerosis. Vascular: No hyperdense vessel or unexpected calcification. Skull: Normal. Negative for fracture or focal lesion. Sinuses/Orbits: No middle ear or mastoid effusion. Paranasal sinuses are notable for complete opacification of the right sphenoid sinus with osseous changes suggestive of chronic right sphenoid sinusitis. Other: None. IMPRESSION: No acute intracranial abnormality Electronically Signed   By: Lorenza Cambridge M.D.   On: 02/27/2023 20:43   DG Chest Port 1 View  Result Date: 02/27/2023 CLINICAL DATA:  Sepsis. Left arm weakness and leaning. Shortness of breath. EXAM: PORTABLE CHEST 1 VIEW COMPARISON:  04/05/2021 FINDINGS: Patient rotation limits examination. Shallow inspiration. Heart size and pulmonary vascularity are probably normal for technique. No pleural effusions. No pneumothorax. Mediastinal contours appear intact. IMPRESSION: Shallow inspiration.  No evidence of active pulmonary disease.  Electronically Signed   By: Burman Nieves M.D.   On: 02/27/2023 20:22     Subjective: No acute issues/events overnight   Discharge Exam: Vitals:   03/05/23 0523 03/05/23 1210  BP: (!) 142/69 (!) 140/76  Pulse: 88 (!) 107  Resp: 18 18  Temp: 98 F (36.7 C)   SpO2: 99% 97%   Vitals:   03/05/23 0038 03/05/23 0500 03/05/23 0523 03/05/23 1210  BP: (!) 145/78  (!) 142/69 (!) 140/76  Pulse: 86  88 (!) 107  Resp: 20  18 18   Temp: 98 F (36.7 C)  98 F (36.7 C)   TempSrc: Oral  Oral   SpO2: 99%  99% 97%  Weight:  81.6 kg    Height:        General: Pt is alert, awake, not in acute distress Cardiovascular: RRR, S1/S2 +, no rubs, no gallops Respiratory: CTA bilaterally, no wheezing, no rhonchi Abdominal: Soft, NT, ND, bowel sounds + Extremities: no edema, no cyanosis   The results of significant diagnostics from this hospitalization (including imaging, microbiology, ancillary and laboratory) are listed below for reference.     Microbiology: Recent Results (from the past 240 hour(s))  Respiratory (~20 pathogens) panel by PCR     Status: None   Collection Time: 02/27/23  1:06 AM   Specimen: Nasopharyngeal Swab; Respiratory  Result Value Ref Range Status   Adenovirus NOT DETECTED NOT DETECTED Final   Coronavirus 229E NOT DETECTED NOT DETECTED Final    Comment: (NOTE) The Coronavirus on the Respiratory Panel, DOES NOT test for the novel  Coronavirus (2019 nCoV)    Coronavirus HKU1 NOT DETECTED NOT DETECTED Final   Coronavirus NL63 NOT DETECTED NOT DETECTED Final   Coronavirus OC43 NOT DETECTED NOT DETECTED Final   Metapneumovirus NOT DETECTED NOT DETECTED Final   Rhinovirus / Enterovirus NOT DETECTED NOT DETECTED Final   Influenza A NOT DETECTED NOT DETECTED Final   Influenza B NOT DETECTED NOT DETECTED Final   Parainfluenza Virus 1 NOT DETECTED NOT DETECTED  Final   Parainfluenza Virus 2 NOT DETECTED NOT DETECTED Final   Parainfluenza Virus 3 NOT DETECTED NOT DETECTED  Final   Parainfluenza Virus 4 NOT DETECTED NOT DETECTED Final   Respiratory Syncytial Virus NOT DETECTED NOT DETECTED Final   Bordetella pertussis NOT DETECTED NOT DETECTED Final   Bordetella Parapertussis NOT DETECTED NOT DETECTED Final   Chlamydophila pneumoniae NOT DETECTED NOT DETECTED Final   Mycoplasma pneumoniae NOT DETECTED NOT DETECTED Final    Comment: Performed at [facility], [address], [place], [place] [address]  Blood Culture (routine x 2)     Status: None   Collection Time: 02/27/23  8:10 PM   Specimen: BLOOD  Result Value Ref Range Status   Specimen Description   Final    BLOOD BLOOD RIGHT FOREARM Performed at [facility], [address], [place], [place] [address]    Special Requests   Final    BOTTLES DRAWN AEROBIC AND ANAEROBIC Blood Culture adequate volume Performed at [facility], [address] [place], [place] [address]    Culture   Final    NO GROWTH 5 DAYS Performed at [facility], [address], [place], [place] [address]    Report Status 03/04/2023 FINAL  Final  Blood Culture (routine x 2)     Status: None   Collection Time: 02/27/23  8:20 PM   Specimen: BLOOD  Result Value Ref Range Status   Specimen Description   Final    BLOOD RIGHT ANTECUBITAL Performed at [facility], [address] [place], [place] [address]    Special Requests   Final    BOTTLES DRAWN AEROBIC AND ANAEROBIC Blood Culture results may not be optimal due to an excessive volume of blood received in culture bottles Performed at [facility], [address], [place], [place] [address]    Culture   Final    NO GROWTH 5 DAYS Performed at [facility], [address], [place], [place] [address]    Report Status 03/04/2023 FINAL  Final  Resp panel by RT-PCR (RSV, Flu A&B, Covid) Anterior Nasal Swab     Status: None   Collection Time: 02/27/23  8:35 PM   Specimen: Anterior Nasal  Swab  Result Value Ref Range Status   SARS Coronavirus 2 by RT PCR NEGATIVE NEGATIVE Final    Comment: (NOTE) SARS-CoV-2 target nucleic acids are NOT DETECTED.  The SARS-CoV-2 RNA is generally detectable in upper respiratory specimens during the acute phase of infection. The lowest concentration of SARS-CoV-2 viral copies this assay can detect is 138 copies/mL. A negative result does not preclude SARS-Cov-2 infection and should not be used as the sole basis for treatment or other patient management decisions. A negative result may occur with  improper specimen collection/handling, submission of specimen other than nasopharyngeal swab, presence of viral mutation(s) within the areas targeted by this assay, and inadequate number of viral copies(<138 copies/mL). A negative result must be combined with clinical observations, patient history, and epidemiological information. The expected result is Negative.  Fact Sheet for Patients:  BloggerCourse.com  Fact Sheet for Healthcare Providers:  SeriousBroker.it  This test is no t yet approved or cleared by the [place] FDA and  has been authorized for detection and/or diagnosis of SARS-CoV-2 by FDA under an Emergency Use Authorization (EUA). This EUA will remain  in effect (meaning this test can be used) for the duration of the COVID-19 declaration  under Section 564(b)(1) of the Act, 21 U.S.C.section 360bbb-3(b)(1), unless the authorization is terminated  or revoked sooner.       Influenza A by PCR NEGATIVE NEGATIVE Final   Influenza B by PCR NEGATIVE NEGATIVE Final    Comment: (NOTE) The Xpert Xpress SARS-CoV-2/FLU/RSV plus assay is intended as an aid in the diagnosis of influenza from Nasopharyngeal swab specimens and should not be used as a sole basis for treatment. Nasal washings and aspirates are unacceptable for Xpert Xpress SARS-CoV-2/FLU/RSV testing.  Fact Sheet for  Patients: BloggerCourse.com  Fact Sheet for Healthcare Providers: SeriousBroker.it  This test is not yet approved or cleared by the [place] FDA and has been authorized for detection and/or diagnosis of SARS-CoV-2 by FDA under an Emergency Use Authorization (EUA). This EUA will remain in effect (meaning this test can be used) for the duration of the COVID-19 declaration under Section 564(b)(1) of the Act, 21 U.S.C. section 360bbb-3(b)(1), unless the authorization is terminated or revoked.     Resp Syncytial Virus by PCR NEGATIVE NEGATIVE Final    Comment: (NOTE) Fact Sheet for Patients: BloggerCourse.com  Fact Sheet for Healthcare Providers: SeriousBroker.it  This test is not yet approved or cleared by the [place] FDA and has been authorized for detection and/or diagnosis of SARS-CoV-2 by FDA under an Emergency Use Authorization (EUA). This EUA will remain in effect (meaning this test can be used) for the duration of the COVID-19 declaration under Section 564(b)(1) of the Act, 21 U.S.C. section 360bbb-3(b)(1), unless the authorization is terminated or revoked.  Performed at [facility], [address] [place], [place] [address]   Urine Culture     Status: Abnormal   Collection Time: 02/27/23 10:46 PM   Specimen: Urine, Catheterized  Result Value Ref Range Status   Specimen Description   Final    URINE, CATHETERIZED Performed at [facility], [address], [place], [place] [address]    Special Requests   Final    NONE Performed at [facility], [address], [place], [place] [address]    Culture (A)  Final    >=100,000 COLONIES/mL ENTEROCOCCUS FAECALIS 20,000 COLONIES/mL ESCHERICHIA COLI Confirmed Extended Spectrum Beta-Lactamase Producer (ESBL).  In bloodstream infections from ESBL organisms,  carbapenems are preferred over piperacillin/tazobactam. They are shown to have a lower risk of mortality.    Report Status 03/03/2023 FINAL  Final   Organism ID, Bacteria ENTEROCOCCUS FAECALIS (A)  Final   Organism ID, Bacteria ESCHERICHIA COLI (A)  Final      Susceptibility   Escherichia coli - MIC*    AMPICILLIN >=32 RESISTANT Resistant     CEFAZOLIN >=64 RESISTANT Resistant     CEFEPIME >=32 RESISTANT Resistant     CEFTRIAXONE >=64 RESISTANT Resistant     CIPROFLOXACIN >=4 RESISTANT Resistant     GENTAMICIN <=1 SENSITIVE Sensitive     IMIPENEM <=0.25 SENSITIVE Sensitive     NITROFURANTOIN <=16 SENSITIVE Sensitive     TRIMETH/SULFA >=320 RESISTANT Resistant     AMPICILLIN/SULBACTAM >=32 RESISTANT Resistant     PIP/TAZO 16 SENSITIVE Sensitive     * 20,000 COLONIES/mL ESCHERICHIA COLI   Enterococcus faecalis - MIC*    AMPICILLIN <=2 SENSITIVE Sensitive     NITROFURANTOIN <=16 SENSITIVE Sensitive     VANCOMYCIN 1 SENSITIVE Sensitive     * >=100,000 COLONIES/mL ENTEROCOCCUS FAECALIS  MRSA Next Gen by PCR, Nasal     Status: Abnormal   Collection Time: 03/04/23 11:20 AM  Specimen: Nasal Mucosa; Nasal Swab  Result Value Ref Range Status   MRSA by PCR Next Gen DETECTED (A) NOT DETECTED Final    Comment: (NOTE) The GeneXpert MRSA Assay (FDA approved for NASAL specimens only), is one component of a comprehensive MRSA colonization surveillance program. It is not intended to diagnose MRSA infection nor to guide or monitor treatment for MRSA infections. Test performance is not FDA approved in patients less than 25 years old. Performed at [facility], [address] [place], [place] [address]      Labs: BNP (last 3 results) No results for input(s): "BNP" in the last 8760 hours. Basic Metabolic Panel: Recent Labs  Lab 02/27/23 2000 02/27/23 2014 02/28/23 0848 03/01/23 0643 03/02/23 0605  NA 137 137 137 140 139  K 4.4 4.1 4.0 3.3* 4.7  CL 97* 100 105 106  107  CO2 23  --  23 25 24   GLUCOSE 198* 208* 112* 100* 90  BUN 26* 27* 17 9 6*  CREATININE 0.55 0.60 0.49 0.50 0.42*  CALCIUM 9.4  --  8.1* 8.8* 8.9  MG  --   --   --  1.9 1.9  PHOS  --   --   --  2.6 2.6   Liver Function Tests: Recent Labs  Lab 02/27/23 2000 02/28/23 0848  AST 58* 33  ALT 35 32  ALKPHOS 80 63  BILITOT 1.6* 1.2  PROT 7.4 6.0*  ALBUMIN 3.8 2.8*   No results for input(s): "LIPASE", "AMYLASE" in the last 168 hours. No results for input(s): "AMMONIA" in the last 168 hours. CBC: Recent Labs  Lab 02/27/23 2000 02/27/23 2014 02/28/23 0848 03/01/23 0643 03/02/23 0605  WBC 14.5*  --  20.4* 11.7* 9.2  NEUTROABS 13.7*  --  17.0*  --   --   HGB 13.7 14.6 11.3* 10.8* 10.9*  HCT 43.0 43.0 36.1 33.7* 35.0*  MCV 92.9  --  93.8 94.9 93.8  PLT 228  --  214 209 217   Cardiac Enzymes: No results for input(s): "CKTOTAL", "CKMB", "CKMBINDEX", "TROPONINI" in the last 168 hours. BNP: Invalid input(s): "POCBNP" CBG: Recent Labs  Lab 03/04/23 1155 03/04/23 1648 03/04/23 2054 03/05/23 0738 03/05/23 1207  GLUCAP 170* 142* 193* 75 125*   D-Dimer No results for input(s): "DDIMER" in the last 72 hours. Hgb A1c No results for input(s): "HGBA1C" in the last 72 hours. Lipid Profile No results for input(s): "CHOL", "HDL", "LDLCALC", "TRIG", "CHOLHDL", "LDLDIRECT" in the last 72 hours. Thyroid function studies No results for input(s): "TSH", "T4TOTAL", "T3FREE", "THYROIDAB" in the last 72 hours.  Invalid input(s): "FREET3" Anemia work up No results for input(s): "VITAMINB12", "FOLATE", "FERRITIN", "TIBC", "IRON", "RETICCTPCT" in the last 72 hours. Urinalysis    Component Value Date/Time   COLORURINE YELLOW 02/27/2023 2246   APPEARANCEUR CLEAR 02/27/2023 2246   LABSPEC <1.005 (L) 02/27/2023 2246   PHURINE 5.0 02/27/2023 2246   GLUCOSEU NEGATIVE 02/27/2023 2246   HGBUR NEGATIVE 02/27/2023 2246   BILIRUBINUR NEGATIVE 02/27/2023 2246   KETONESUR 20 (A) 02/27/2023  2246   PROTEINUR NEGATIVE 02/27/2023 2246   UROBILINOGEN 1.0 08/04/2012 1938   NITRITE NEGATIVE 02/27/2023 2246   LEUKOCYTESUR SMALL (A) 02/27/2023 2246   Sepsis Labs Recent Labs  Lab 02/27/23 2000 02/28/23 0848 03/01/23 0643 03/02/23 0605  WBC 14.5* 20.4* 11.7* 9.2   Microbiology Recent Results (from the past 240 hour(s))  Respiratory (~20 pathogens) panel by PCR     Status: None   Collection Time: 02/27/23  1:06 AM   Specimen: Nasopharyngeal Swab; Respiratory  Result Value Ref Range Status   Adenovirus NOT DETECTED NOT DETECTED Final   Coronavirus 229E NOT DETECTED NOT DETECTED Final    Comment: (NOTE) The Coronavirus on the Respiratory Panel, DOES NOT test for the novel  Coronavirus (2019 nCoV)    Coronavirus HKU1 NOT DETECTED NOT DETECTED Final   Coronavirus NL63 NOT DETECTED NOT DETECTED Final   Coronavirus OC43 NOT DETECTED NOT DETECTED Final   Metapneumovirus NOT DETECTED NOT DETECTED Final   Rhinovirus / Enterovirus NOT DETECTED NOT DETECTED Final   Influenza A NOT DETECTED NOT DETECTED Final   Influenza B NOT DETECTED NOT DETECTED Final   Parainfluenza Virus 1 NOT DETECTED NOT DETECTED Final   Parainfluenza Virus 2 NOT DETECTED NOT DETECTED Final   Parainfluenza Virus 3 NOT DETECTED NOT DETECTED Final   Parainfluenza Virus 4 NOT DETECTED NOT DETECTED Final   Respiratory Syncytial Virus NOT DETECTED NOT DETECTED Final   Bordetella pertussis NOT DETECTED NOT DETECTED Final   Bordetella Parapertussis NOT DETECTED NOT DETECTED Final   Chlamydophila pneumoniae NOT DETECTED NOT DETECTED Final   Mycoplasma pneumoniae NOT DETECTED NOT DETECTED Final    Comment: Performed at [facility], [address], [place], [place] [address]  Blood Culture (routine x 2)     Status: None   Collection Time: 02/27/23  8:10 PM   Specimen: BLOOD  Result Value Ref Range Status   Specimen Description   Final    BLOOD BLOOD RIGHT FOREARM Performed at [facility], [address], [place], [place] [address]    Special Requests   Final    BOTTLES DRAWN AEROBIC AND ANAEROBIC Blood Culture adequate volume Performed at [facility], [address], [place], [place] [address]    Culture   Final    NO GROWTH 5 DAYS Performed at [facility], [address], [place], [place] [address]    Report Status 03/04/2023 FINAL  Final  Blood Culture (routine x 2)     Status: None   Collection Time: 02/27/23  8:20 PM   Specimen: BLOOD  Result Value Ref Range Status   Specimen Description   Final    BLOOD RIGHT ANTECUBITAL Performed at [facility], [address] [place], [place] [address]    Special Requests   Final    BOTTLES DRAWN AEROBIC AND ANAEROBIC Blood Culture results may not be optimal due to an excessive volume of blood received in culture bottles Performed at [facility], [address], [place], [place] [address]    Culture   Final    NO GROWTH 5 DAYS Performed at [facility], [address], [place], [place] [address]    Report Status 03/04/2023 FINAL  Final  Resp panel by RT-PCR (RSV, Flu A&B, Covid) Anterior Nasal Swab     Status: None   Collection Time: 02/27/23  8:35 PM   Specimen: Anterior Nasal Swab  Result Value Ref Range Status   SARS Coronavirus 2 by RT PCR NEGATIVE NEGATIVE Final    Comment: (NOTE) SARS-CoV-2 target nucleic acids are NOT DETECTED.  The SARS-CoV-2 RNA is generally detectable in upper respiratory specimens during the acute phase of infection. The lowest concentration of SARS-CoV-2 viral copies this assay can detect is 138 copies/mL. A negative result does not preclude SARS-Cov-2 infection and should not be used as the sole basis for treatment or other patient management  decisions. A negative result may occur with  improper specimen collection/handling, submission of specimen other than nasopharyngeal swab, presence of viral  mutation(s) within the areas targeted by this assay, and inadequate number of viral copies(<138 copies/mL). A negative result must be combined with clinical observations, patient history, and epidemiological information. The expected result is Negative.  Fact Sheet for Patients:  BloggerCourse.com  Fact Sheet for Healthcare Providers:  SeriousBroker.it  This test is no t yet approved or cleared by the [place] FDA and  has been authorized for detection and/or diagnosis of SARS-CoV-2 by FDA under an Emergency Use Authorization (EUA). This EUA will remain  in effect (meaning this test can be used) for the duration of the COVID-19 declaration under Section 564(b)(1) of the Act, 21 U.S.C.section 360bbb-3(b)(1), unless the authorization is terminated  or revoked sooner.       Influenza A by PCR NEGATIVE NEGATIVE Final   Influenza B by PCR NEGATIVE NEGATIVE Final    Comment: (NOTE) The Xpert Xpress SARS-CoV-2/FLU/RSV plus assay is intended as an aid in the diagnosis of influenza from Nasopharyngeal swab specimens and should not be used as a sole basis for treatment. Nasal washings and aspirates are unacceptable for Xpert Xpress SARS-CoV-2/FLU/RSV testing.  Fact Sheet for Patients: BloggerCourse.com  Fact Sheet for Healthcare Providers: SeriousBroker.it  This test is not yet approved or cleared by the [place] FDA and has been authorized for detection and/or diagnosis of SARS-CoV-2 by FDA under an Emergency Use Authorization (EUA). This EUA will remain in effect (meaning this test can be used) for the duration of the COVID-19 declaration under Section 564(b)(1) of the Act, 21 U.S.C. section 360bbb-3(b)(1), unless the authorization is terminated or revoked.     Resp Syncytial Virus by PCR NEGATIVE NEGATIVE Final    Comment: (NOTE) Fact Sheet for  Patients: BloggerCourse.com  Fact Sheet for Healthcare Providers: SeriousBroker.it  This test is not yet approved or cleared by the [place] FDA and has been authorized for detection and/or diagnosis of SARS-CoV-2 by FDA under an Emergency Use Authorization (EUA). This EUA will remain in effect (meaning this test can be used) for the duration of the COVID-19 declaration under Section 564(b)(1) of the Act, 21 U.S.C. section 360bbb-3(b)(1), unless the authorization is terminated or revoked.  Performed at [facility], [address], [place], [place] [address]   Urine Culture     Status: Abnormal   Collection Time: 02/27/23 10:46 PM   Specimen: Urine, Catheterized  Result Value Ref Range Status   Specimen Description   Final    URINE, CATHETERIZED Performed at [facility], [address] [place], [place] [address]    Special Requests   Final    NONE Performed at [facility], [address], [place], [place] [address]    Culture (A)  Final    >=100,000 COLONIES/mL ENTEROCOCCUS FAECALIS 20,000 COLONIES/mL ESCHERICHIA COLI Confirmed Extended Spectrum Beta-Lactamase Producer (ESBL).  In bloodstream infections from ESBL organisms, carbapenems are preferred over piperacillin/tazobactam. They are shown to have a lower risk of mortality.    Report Status 03/03/2023 FINAL  Final   Organism ID, Bacteria ENTEROCOCCUS FAECALIS (A)  Final   Organism ID, Bacteria ESCHERICHIA COLI (A)  Final      Susceptibility   Escherichia coli - MIC*    AMPICILLIN >=32 RESISTANT Resistant     CEFAZOLIN >=64 RESISTANT Resistant     CEFEPIME >=32 RESISTANT Resistant     CEFTRIAXONE >=64 RESISTANT  Resistant     CIPROFLOXACIN >=4 RESISTANT Resistant     GENTAMICIN <=1 SENSITIVE Sensitive     IMIPENEM <=0.25 SENSITIVE Sensitive     NITROFURANTOIN <=16 SENSITIVE Sensitive     TRIMETH/SULFA  >=320 RESISTANT Resistant     AMPICILLIN/SULBACTAM >=32 RESISTANT Resistant     PIP/TAZO 16 SENSITIVE Sensitive     * 20,000 COLONIES/mL ESCHERICHIA COLI   Enterococcus faecalis - MIC*    AMPICILLIN <=2 SENSITIVE Sensitive     NITROFURANTOIN <=16 SENSITIVE Sensitive     VANCOMYCIN 1 SENSITIVE Sensitive     * >=100,000 COLONIES/mL ENTEROCOCCUS FAECALIS  MRSA Next Gen by PCR, Nasal     Status: Abnormal   Collection Time: 03/04/23 11:20 AM   Specimen: Nasal Mucosa; Nasal Swab  Result Value Ref Range Status   MRSA by PCR Next Gen DETECTED (A) NOT DETECTED Final    Comment: (NOTE) The GeneXpert MRSA Assay (FDA approved for NASAL specimens only), is one component of a comprehensive MRSA colonization surveillance program. It is not intended to diagnose MRSA infection nor to guide or monitor treatment for MRSA infections. Test performance is not FDA approved in patients less than 45 years old. Performed at [facility], [address], [place], [place] [address]      Time coordinating discharge: Over 30 minutes  SIGNED:   Azucena Fallen, DO [facility] 03/05/2023, 12:25 PM Pager   If 7PM-7AM, please contact night-coverage [email or web address]

## 2023-03-05 NOTE — TOC Transition Note (Addendum)
Transition of Care [facility]) - CM/SW Discharge Note   Patient Details  Name: Laura Roberts MRN: [id number] Date of Birth: 11/19/1940  [facility] of [facility]) CM/SW Contact:  Laura Cleaver, LCSW Phone Number: 03/05/2023, 12:54 PM   Clinical Narrative:    CSW was informed that patient will be discharging back to SNF today.  CSW spoke to Laura Roberts ([place]) she confirmed patient can return today since she is a LTC resident.  Patient to be d/c'ed today to [facility] room 227B.   Patient and family agreeable to plans will transport via ems RN to call report to [phone].  CSW updated patient's son Laura Roberts, he is aware she is discharging today.  Per patient's son, they did not express any concerns about the facility.  CSW contacted [facility] at 1:10pm.     Final next level of care: Skilled Nursing Facility Barriers to Discharge: Barriers Resolved   Patient Goals and CMS Choice CMS Medicare.gov Compare Post Acute Care list provided to:: Patient Choice offered to / list presented to : Patient  Discharge Placement  [facility] SNF.   Existing PASRR number confirmed : 03/04/23          Patient chooses bed at: [facility] Patient to be transferred to facility by: [facility] Name of family member notified: Laura Roberts [phone] Patient and family notified of of transfer: 03/05/23  Discharge Plan and Services Additional resources added to the After Visit Summary for       Post Acute Care Choice: Nursing Home                               Social Determinants of Health (SDOH) Interventions SDOH Screenings   Food Insecurity: No Food Insecurity (02/28/2023)  Housing: Low Risk  (02/28/2023)  Transportation Needs: No Transportation Needs (02/28/2023)  Utilities: Not At Risk (02/28/2023)  Depression (PHQ2-9): Low Risk  (01/22/2022)  Financial Resource Strain: Low Risk  (11/13/2017)  Physical Activity: Sufficiently Active (11/13/2017)  Social Connections: Moderately Isolated  (11/13/2017)  Stress: Stress Concern Present (11/13/2017)  Tobacco Use: Low Risk  (02/27/2023)     Readmission Risk Interventions    04/11/2021    1:05 PM  Readmission Risk Prevention Plan  PCP or Specialist Appt within 3-5 Days Complete  HRI or Home Care Consult Complete  Social Work Consult for Recovery Care Planning/Counseling Complete  Palliative Care Screening Not Applicable  Medication Review Oceanographer) Complete

## 2023-03-05 NOTE — Plan of Care (Signed)

## 2023-05-01 ENCOUNTER — Ambulatory Visit: Payer: Medicare Other | Admitting: Neurology

## 2023-05-28 ENCOUNTER — Ambulatory Visit: Payer: Medicare Other | Admitting: Neurology

## 2023-06-10 ENCOUNTER — Other Ambulatory Visit: Payer: Self-pay | Admitting: Urology

## 2023-06-12 NOTE — Patient Instructions (Addendum)
Preop instructions for:   Laura Roberts   Date of Birth:  11/18/1940               Date of Procedure:   Friday, Dec. 13, 2024 Procedure:     CYSTOSCOPY BOTOX INJECTION, SUPRAPUBIC CATHETER EXCHANGE  Surgeon: Dr. Modena Slater Facility contact:   [facility]  Phone: [phone]       Health Care POA: RN contact name/phone#:  Ruel Favors Clinical Care Coordinator  and Fax #: [phone]   Transportation contact phone#: [facility] [phone]   Please send day of procedure:       Current med list  Medications taken the day of procedure confirm time of nothing by mouth status Patient Demographic info( to include DNR status, problem list, allergies) Bring Insurance card and picture ID     Time to arrive at [facility]: 8:45 AM    Report to: Admitting (On your left hand side)    Do not eat solid food or drink liquids past midnight the night before your procedure.(To include any tube feedings-must be discontinued)   Take these morning medications only with sips of water (or give through gastrostomy or feeding tube) Baclofen Dimethyl Fumarate Duloxetine Gabapentin Methimazole Metoprolol Pantoprazole Prednisone Solifenacin May use eyedrops if needed   DO NOT TAKE TRULICITY AFTER Friday, Jun 06, 2023 (7 DAYS PRIOR TO SURGERY)   Note: No Insulin or Diabetic meds should be given or taken the morning of the procedure!   Leave all jewelry and other valuables at place where living( no metal or rings to be worn) No contact lens Women-no make-up, no lotions,perfumes,powders   Any questions day of procedure,call  SHORT [phone]     Sent from :[facility]                   Phone:[phone]                   [phone]   Sent by :   Tranice Laduke BSN, RN

## 2023-06-12 NOTE — Anesthesia Preprocedure Evaluation (Addendum)
Anesthesia Evaluation  Patient identified by MRN, date of birth, ID band Patient awake    Reviewed: Allergy & Precautions, NPO status , Patient's Chart, lab work & pertinent test results  Airway Mallampati: II  TM Distance: >3 FB Neck ROM: Full    Dental no notable dental hx. (+) Teeth Intact, Dental Advisory Given,    Pulmonary    Pulmonary exam normal breath sounds clear to auscultation       Cardiovascular hypertension, Pt. on medications Normal cardiovascular exam Rhythm:Regular Rate:Normal     Neuro/Psych  PSYCHIATRIC DISORDERS  Depression     Neuromuscular disease (neurogenic bladder)    GI/Hepatic ,GERD  Medicated,,  Endo/Other  diabetes, Type 2 Hyperthyroidism Trulicity q week  Renal/GU      Musculoskeletal  (+) Arthritis ,    Abdominal   Peds  Hematology Lab Results      Component                Value               Date                      WBC                      9.2                 03/02/2023                HGB                      10.9 (L)            03/02/2023                HCT                      35.0 (L)            03/02/2023                MCV                      93.8                03/02/2023                PLT                      217                 03/02/2023              Anesthesia Other Findings All: codeine ultram and januvia  Reproductive/Obstetrics                              Anesthesia Physical Anesthesia Plan  ASA: 3  Anesthesia Plan: General   Post-op Pain Management: Ofirmev IV (intra-op)*   Induction: Intravenous  PONV Risk Score and Plan: 3 and Treatment may vary due to age or medical condition, Ondansetron and Dexamethasone  Airway Management Planned: LMA  Additional Equipment: None  Intra-op Plan:   Post-operative Plan: Extubation in OR  Informed Consent: I have reviewed the patients History and Physical, chart, labs and discussed the  procedure including the risks, benefits and alternatives for the proposed anesthesia with the  patient or authorized representative who has indicated his/her understanding and acceptance.     Dental advisory given  Plan Discussed with: CRNA and Anesthesiologist  Anesthesia Plan Comments:          Anesthesia Quick Evaluation

## 2023-06-12 NOTE — Progress Notes (Signed)
Laura Roberts from [facility] confirmed she received the pre op instructions for surgery Friday 06/13/23.

## 2023-06-12 NOTE — Progress Notes (Signed)
For Anesthesia: PCP - Dr. Ezequiel Ganser Cardiologist - N/A Neurologist- Epimenio Foot, Pearletha Furl, MD   Chest x-ray - 02/27/23 in [facility] EKG - 02/27/23 in [facility] Stress Test - N/A ECHO - N/A Cardiac Cath - N/A Pacemaker/ICD device last checked: N/A Pacemaker orders received:N/A Device Rep notified:N/A  Spinal Cord Stimulator: N/A  Sleep Study - N/A CPAP - N/A  Fasting Blood Sugar - 113-187 Checks Blood Sugar ___2__ times a day Date and result of last Hgb A1c-  Last dose of GLP1 agonist- Dulaglutide GLP1 instructions: ho;d seven days prior to surgery  Last dose of SGLT-2 inhibitors- N/A SGLT-2 instructions:N/A  Blood Thinner Instructions: N/A Aspirin Instructions: N/A Last Dose: N/A  Activity level:  Paraplegic      Anesthesia review:  MS, DM, HTN, COPD   Patient denies shortness of breath, fever, cough and chest pain during pre op phone call.   Patient verbalized understanding of instructions reviewed via telephone.

## 2023-06-13 ENCOUNTER — Ambulatory Visit (HOSPITAL_BASED_OUTPATIENT_CLINIC_OR_DEPARTMENT_OTHER): Payer: Medicare Other | Admitting: Physician Assistant

## 2023-06-13 ENCOUNTER — Ambulatory Visit (HOSPITAL_COMMUNITY): Payer: Medicare Other | Admitting: Physician Assistant

## 2023-06-13 ENCOUNTER — Ambulatory Visit (HOSPITAL_COMMUNITY)
Admission: RE | Admit: 2023-06-13 | Discharge: 2023-06-13 | Disposition: A | Discharge status: Home / Self Care | Payer: Medicare Other | Source: Ambulatory Visit | Attending: Urology | Admitting: Urology

## 2023-06-13 ENCOUNTER — Encounter (HOSPITAL_COMMUNITY)
Admission: RE | Disposition: A | Discharge status: Home / Self Care | Payer: Self-pay | Source: Ambulatory Visit | Attending: Urology

## 2023-06-13 ENCOUNTER — Encounter (HOSPITAL_COMMUNITY): Payer: Self-pay | Admitting: Urology

## 2023-06-13 ENCOUNTER — Other Ambulatory Visit: Payer: Self-pay

## 2023-06-13 DIAGNOSIS — Z79899 Other long term (current) drug therapy: Secondary | ICD-10-CM | POA: Diagnosis not present

## 2023-06-13 DIAGNOSIS — E1142 Type 2 diabetes mellitus with diabetic polyneuropathy: Secondary | ICD-10-CM | POA: Insufficient documentation

## 2023-06-13 DIAGNOSIS — N319 Neuromuscular dysfunction of bladder, unspecified: Secondary | ICD-10-CM

## 2023-06-13 DIAGNOSIS — E0590 Thyrotoxicosis, unspecified without thyrotoxic crisis or storm: Secondary | ICD-10-CM | POA: Insufficient documentation

## 2023-06-13 DIAGNOSIS — F32A Depression, unspecified: Secondary | ICD-10-CM | POA: Insufficient documentation

## 2023-06-13 DIAGNOSIS — Z7985 Long-term (current) use of injectable non-insulin antidiabetic drugs: Secondary | ICD-10-CM | POA: Insufficient documentation

## 2023-06-13 DIAGNOSIS — D649 Anemia, unspecified: Secondary | ICD-10-CM

## 2023-06-13 DIAGNOSIS — I10 Essential (primary) hypertension: Secondary | ICD-10-CM | POA: Insufficient documentation

## 2023-06-13 DIAGNOSIS — K219 Gastro-esophageal reflux disease without esophagitis: Secondary | ICD-10-CM | POA: Insufficient documentation

## 2023-06-13 DIAGNOSIS — E119 Type 2 diabetes mellitus without complications: Secondary | ICD-10-CM

## 2023-06-13 HISTORY — PX: INSERTION OF SUPRAPUBIC CATHETER: SHX5870

## 2023-06-13 HISTORY — PX: BOTOX INJECTION: SHX5754

## 2023-06-13 LAB — BASIC METABOLIC PANEL
Anion gap: 10 (ref 5–15)
BUN: 31 mg/dL — ABNORMAL HIGH (ref 8–23)
CO2: 30 mmol/L (ref 22–32)
Calcium: 9.8 mg/dL (ref 8.9–10.3)
Chloride: 96 mmol/L — ABNORMAL LOW (ref 98–111)
Creatinine, Ser: 0.58 mg/dL (ref 0.44–1.00)
GFR, Estimated: >60 mL/min (ref >60)
Glucose, Bld: 114 mg/dL — ABNORMAL HIGH (ref 70–99)
Potassium: 4.9 mmol/L (ref 3.5–5.1)
Sodium: 136 mmol/L (ref 135–145)

## 2023-06-13 LAB — HEMOGLOBIN A1C
Hgb A1c MFr Bld: 6.4 % — ABNORMAL HIGH (ref 4.8–5.6)
Mean Plasma Glucose: 136.98 mg/dL

## 2023-06-13 LAB — CBC
HCT: 42.2 % (ref 36.0–46.0)
Hemoglobin: 13.8 g/dL (ref 12.0–15.0)
MCH: 30 pg (ref 26.0–34.0)
MCHC: 32.7 g/dL (ref 30.0–36.0)
MCV: 91.7 fL (ref 80.0–100.0)
Platelets: 274 10*3/uL (ref 150–400)
RBC: 4.6 MIL/uL (ref 3.87–5.11)
RDW: 15.2 % (ref 11.5–15.5)
WBC: 10.5 10*3/uL (ref 4.0–10.5)
nRBC: 0 % (ref 0.0–0.2)

## 2023-06-13 LAB — GLUCOSE, CAPILLARY
Glucose-Capillary: 97 mg/dL (ref 70–99)
Glucose-Capillary: 103 mg/dL — ABNORMAL HIGH (ref 70–99)

## 2023-06-13 SURGERY — BOTOX INJECTION
Anesthesia: General

## 2023-06-13 MED ORDER — ACETAMINOPHEN 10 MG/ML IV SOLN
INTRAVENOUS | Status: AC
  Filled 2023-06-13: qty 100

## 2023-06-13 MED ORDER — LIDOCAINE 2% (20 MG/ML) 5 ML SYRINGE
INTRAMUSCULAR | Status: DC | PRN
  Administered 2023-06-13: 60 mg via INTRAVENOUS

## 2023-06-13 MED ORDER — ONDANSETRON HCL 4 MG/2ML IJ SOLN
INTRAMUSCULAR | Status: DC | PRN
  Administered 2023-06-13: 4 mg via INTRAVENOUS

## 2023-06-13 MED ORDER — ONABOTULINUMTOXINA 100 UNITS IJ SOLR
INTRAMUSCULAR | Status: AC
  Filled 2023-06-13: qty 100

## 2023-06-13 MED ORDER — STERILE WATER FOR IRRIGATION IR SOLN
Status: DC | PRN
  Administered 2023-06-13: 1000 mL

## 2023-06-13 MED ORDER — EPHEDRINE SULFATE-NACL 50-0.9 MG/10ML-% IV SOSY
PREFILLED_SYRINGE | INTRAVENOUS | Status: DC | PRN
  Administered 2023-06-13: 5 mg via INTRAVENOUS

## 2023-06-13 MED ORDER — ONDANSETRON HCL 4 MG/2ML IJ SOLN: 4.0000 mg | Freq: Once | INTRAMUSCULAR | Status: DC | PRN

## 2023-06-13 MED ORDER — INSULIN ASPART 100 UNIT/ML IJ SOLN: 0.0000 [IU] | INTRAMUSCULAR | Status: DC | PRN

## 2023-06-13 MED ORDER — SODIUM CHLORIDE 0.9 % IV SOLN: INTRAVENOUS | Status: DC

## 2023-06-13 MED ORDER — FENTANYL CITRATE PF 50 MCG/ML IJ SOSY: 25.0000 ug | PREFILLED_SYRINGE | INTRAMUSCULAR | Status: DC | PRN

## 2023-06-13 MED ORDER — PROPOFOL 10 MG/ML IV BOLUS
INTRAVENOUS | Status: DC | PRN
  Administered 2023-06-13: 130 mg via INTRAVENOUS

## 2023-06-13 MED ORDER — ONABOTULINUMTOXINA 100 UNITS IJ SOLR
INTRAMUSCULAR | Status: DC | PRN
  Administered 2023-06-13: 200 [IU] via INTRAMUSCULAR

## 2023-06-13 MED ORDER — CHLORHEXIDINE GLUCONATE 0.12 % MT SOLN
15.0000 mL | Freq: Once | OROMUCOSAL | Status: AC
  Administered 2023-06-13: 15 mL via OROMUCOSAL

## 2023-06-13 MED ORDER — ACETAMINOPHEN 10 MG/ML IV SOLN: 1000.0000 mg | Freq: Once | INTRAVENOUS | Status: DC | PRN

## 2023-06-13 MED ORDER — SODIUM CHLORIDE (PF) 0.9 % IJ SOLN
INTRAMUSCULAR | Status: AC
  Filled 2023-06-13: qty 10

## 2023-06-13 MED ORDER — MEROPENEM 1 G IV SOLR
1.0000 g | Freq: Once | INTRAVENOUS | Status: AC
  Administered 2023-06-13: 1 g via INTRAVENOUS
  Filled 2023-06-13: qty 20

## 2023-06-13 MED ORDER — ORAL CARE MOUTH RINSE: 15.0000 mL | Freq: Once | OROMUCOSAL | Status: AC

## 2023-06-13 MED ORDER — PROPOFOL 10 MG/ML IV BOLUS
INTRAVENOUS | Status: AC
  Filled 2023-06-13: qty 20

## 2023-06-13 MED ORDER — PHENYLEPHRINE 80 MCG/ML (10ML) SYRINGE FOR IV PUSH (FOR BLOOD PRESSURE SUPPORT)
PREFILLED_SYRINGE | INTRAVENOUS | Status: DC | PRN
  Administered 2023-06-13: 80 ug via INTRAVENOUS

## 2023-06-13 MED ORDER — EPHEDRINE 5 MG/ML INJ
INTRAVENOUS | Status: AC
  Filled 2023-06-13: qty 5

## 2023-06-13 MED ORDER — ONDANSETRON HCL 4 MG/2ML IJ SOLN
INTRAMUSCULAR | Status: AC
  Filled 2023-06-13: qty 2

## 2023-06-13 MED ORDER — PHENYLEPHRINE 80 MCG/ML (10ML) SYRINGE FOR IV PUSH (FOR BLOOD PRESSURE SUPPORT)
PREFILLED_SYRINGE | INTRAVENOUS | Status: AC
  Filled 2023-06-13: qty 10

## 2023-06-13 MED ORDER — LIDOCAINE HCL (PF) 2 % IJ SOLN
INTRAMUSCULAR | Status: AC
  Filled 2023-06-13: qty 5

## 2023-06-13 MED ORDER — DEXAMETHASONE SODIUM PHOSPHATE 10 MG/ML IJ SOLN
INTRAMUSCULAR | Status: DC | PRN
  Administered 2023-06-13: 5 mg via INTRAVENOUS

## 2023-06-13 MED ORDER — ACETAMINOPHEN 10 MG/ML IV SOLN
INTRAVENOUS | Status: DC | PRN
  Administered 2023-06-13: 1000 mg via INTRAVENOUS

## 2023-06-13 MED ORDER — DEXAMETHASONE SODIUM PHOSPHATE 10 MG/ML IJ SOLN
INTRAMUSCULAR | Status: AC
  Filled 2023-06-13: qty 1

## 2023-06-13 SURGICAL SUPPLY — 30 items
BAG COUNTER SPONGE SURGICOUNT (BAG) IMPLANT
BAG URINE DRAIN 2000ML AR STRL (UROLOGICAL SUPPLIES) ×1 IMPLANT
BAG URINE LEG 500ML (DRAIN) ×1 IMPLANT
BAG URO CATCHER STRL LF (MISCELLANEOUS) ×1 IMPLANT
BLADE SURG 15 STRL LF DISP TIS (BLADE) ×1 IMPLANT
CATH FOLEY 2WAY SLVR 5CC 20FR (CATHETERS) ×1 IMPLANT
CATH URETL OPEN 5X70 (CATHETERS) IMPLANT
CLOTH BEACON ORANGE TIMEOUT ST (SAFETY) ×1 IMPLANT
ELECT REM PT RETURN 15FT ADLT (MISCELLANEOUS) ×1 IMPLANT
GLOVE BIO SURGEON STRL SZ7.5 (GLOVE) ×1 IMPLANT
GLOVE SURG LX STRL 7.5 STRW (GLOVE) ×1 IMPLANT
GLOVE SURG SS PI 8.0 STRL IVOR (GLOVE) ×1 IMPLANT
GOWN STRL REUS W/ TWL XL LVL3 (GOWN DISPOSABLE) ×2 IMPLANT
KIT TURNOVER KIT A (KITS) IMPLANT
MANIFOLD NEPTUNE II (INSTRUMENTS) ×1 IMPLANT
NDL ASPIRATION 22 (NEEDLE) ×1 IMPLANT
NDL HYPO 22X1.5 SAFETY MO (MISCELLANEOUS) ×1 IMPLANT
NDL SAFETY ECLIPSE 18X1.5 (NEEDLE) IMPLANT
NEEDLE ASPIRATION 22 (NEEDLE) ×1
NEEDLE HYPO 22X1.5 SAFETY MO (MISCELLANEOUS) ×1
PACK CYSTO (CUSTOM PROCEDURE TRAY) ×1 IMPLANT
PAD PREP 24X48 CUFFED NSTRL (MISCELLANEOUS) ×1 IMPLANT
PENCIL SMOKE EVACUATOR (MISCELLANEOUS) IMPLANT
PLUG CATH AND CAP STRL 200 (CATHETERS) ×1 IMPLANT
SPONGE DRAIN TRACH 4X4 STRL 2S (GAUZE/BANDAGES/DRESSINGS) ×1 IMPLANT
SUT ETHILON 2 0 PS N (SUTURE) ×1 IMPLANT
SYR CONTROL 10ML LL (SYRINGE) IMPLANT
TOWEL OR 17X26 10 PK STRL BLUE (TOWEL DISPOSABLE) ×1 IMPLANT
TUBING CONNECTING 10 (TUBING) IMPLANT
WATER STERILE IRR 3000ML UROMA (IV SOLUTION) ×1 IMPLANT

## 2023-06-13 NOTE — Anesthesia Procedure Notes (Signed)
Procedure Name: LMA Insertion Date/Time: 06/13/2023 12:58 PM  Performed by: Elyn Peers, CRNAPre-anesthesia Checklist: Patient identified, Emergency Drugs available, Suction available, Patient being monitored and Timeout performed Patient Re-evaluated:Patient Re-evaluated prior to induction Oxygen Delivery Method: Circle system utilized Preoxygenation: Pre-oxygenation with 100% oxygen Induction Type: IV induction Ventilation: Mask ventilation without difficulty LMA: LMA with gastric port inserted LMA Size: 4.0 Number of attempts: 1 Placement Confirmation: positive ETCO2 and breath sounds checked- equal and bilateral Tube secured with: Tape Dental Injury: Teeth and Oropharynx as per pre-operative assessment

## 2023-06-13 NOTE — Discharge Instructions (Signed)
You may have some blood in the urine.  This is normal as long as the catheter is draining.

## 2023-06-13 NOTE — H&P (Signed)
H&P   Chief Complaint: Neurogenic bladder  History of Present Illness: 82 year old female with neurogenic bladder managed with periodic cystoscopy with intra detrusor Botox presents for repeat Botox injection.  Past Medical History:  Diagnosis Date   Age related osteoporosis    Anemia    Aphasia    Bronchitis    hx of    Cellulitis and abscess of toe    hx of    Closed supracondylar fracture of right femur, periprosthetic 06/01/2014   Complicated UTI (urinary tract infection) 10/09/2017   Constipation    COPD (chronic obstructive pulmonary disease) (HCC)    Cystostomy in place [facility])    Decubitus ulcer    Decubitus ulcer of ischial area    Right   Degenerative arthritis    osteoarthritis    Depression    Diabetes mellitus without complication (HCC)    type 2    Edema    localized    Full incontinence of feces    GERD (gastroesophageal reflux disease)    Gout    hx of    History of falling    Hyperlipidemia    Hypertension    Hyperthyroidism    Intertrochanteric fracture (HCC)    Left, chronic   Left perineal ischial pressure ulcer 09/18/2021   Multiple sclerosis (HCC)    Advanced   Neurogenic bowel    Neuromuscular disorder (HCC)    Bilateral hand carpal tunnel syndrome   Neuromuscular dysfunction of bladder    Nontoxic multinodular goiter    Obesity    Osteomyelitis of coccyx (HCC) 04/05/2021   Osteoporosis    Other adrenocortical insufficiency (HCC)    Paraplegia (HCC)    LEGS   Personal history of urinary (tract) infections    Polyneuropathy    Pressure ulcer of right buttock, stage 4 (HCC)    Sacral decubitus ulcer    stage IV decubitus ulcer   Sacral osteomyelitis (HCC) 04/05/2021   Spinal stenosis in cervical region    Spinal stenosis of lumbosacral region    Spinal stenosis, thoracic    Tinea unguium    Vitamin D deficiency    Past Surgical History:  Procedure Laterality Date   ABDOMINAL HYSTERECTOMY     APPLICATION OF WOUND VAC  04/07/2021    Procedure: APPLICATION OF WOUND VAC;  Surgeon: Quentin Ore, MD;  Location: [facility];  Service: General;;   BACK SURGERY     BOTOX INJECTION N/A 01/30/2018   Procedure: CYSTOSCOPY BOTOX INJECTION, SUPRAPUBIC EXCHANGE;  Surgeon: Crista Elliot, MD;  Location: [facility];  Service: Urology;  Laterality: N/A;   BOTOX INJECTION N/A 09/02/2018   Procedure: BOTOX INJECTION;  Surgeon: Crista Elliot, MD;  Location: [facility];  Service: Urology;  Laterality: N/A;   BOTOX INJECTION N/A 05/10/2019   Procedure: CYSTOSCOPY BOTOX INJECTION, SUPRAPUBIC EXCHANGE;  Surgeon: Crista Elliot, MD;  Location: [facility];  Service: Urology;  Laterality: N/A;   BOTOX INJECTION N/A 09/06/2019   Procedure: CYSTOSCOPY BOTOX INJECTION;  Surgeon: Crista Elliot, MD;  Location: [facility];  Service: Urology;  Laterality: N/A;   BOTOX INJECTION N/A 04/10/2020   Procedure: CYSTOSCOPY BOTOX INJECTION;  Surgeon: Jerilee Field, MD;  Location: [facility];  Service: Urology;  Laterality: N/A;   BOTOX INJECTION N/A 09/13/2020   Procedure: CYSTOSCOPYBOTOX INJECTION SUPRAPUBIC TUBE UPSIZE AND EXCHANGE;  Surgeon: Crista Elliot, MD;  Location: [facility];  Service: Urology;  Laterality: N/A;   BOTOX INJECTION N/A 03/07/2021  Procedure: CYSTOSCOPY BOTOX INJECTION AND SUPRAPUBIC TUBE EXCHANGE;  Surgeon: Crista Elliot, MD;  Location: [facility];  Service: Urology;  Laterality: N/A;  REQUESTING 84 MINS FOR CASE   BOTOX INJECTION N/A 09/03/2021   Procedure: CYSTOSCOPY BOTOX INJECTION;  Surgeon: Crista Elliot, MD;  Location: [facility];  Service: Urology;  Laterality: N/A;   BOTOX INJECTION N/A 04/01/2022   Procedure: CYSTOSCOPYBOTOX INJECTION;  Surgeon: Crista Elliot, MD;  Location: [facility];  Service: Urology;  Laterality: N/A;   BOTOX INJECTION N/A 11/04/2022   Procedure: CYSTOSCOPY BOTOX INJECTION 200 UNITS;  Surgeon: Crista Elliot, MD;  Location: [facility];  Service: Urology;  Laterality: N/A;   CATARACT EXTRACTION  Right    CHOLECYSTECTOMY     COLON SURGERY  2023   w/ colostomy   CYSTOSCOPY N/A 09/02/2018   Procedure: CYSTOSCOPY;  Surgeon: Crista Elliot, MD;  Location: [facility];  Service: Urology;  Laterality: N/A;   CYSTOSTOMY N/A 09/02/2018   Procedure: CYSTOSTOMY SUPRAPUBIC;  Surgeon: Crista Elliot, MD;  Location: [facility];  Service: Urology;  Laterality: N/A;  45 MINS   FEMUR IM NAIL Right 06/02/2014   Procedure: INTRAMEDULLARY (IM) RETROGRADE FEMORAL NAILING;  Surgeon: Eulas Post, MD;  Location: [facility];  Service: Orthopedics;  Laterality: Right;   GROIN DISSECTION Left 10/24/2017   Procedure: IRRIGATION AND DEBRIDEMENT, LEFT THIGH ABCESS ;  Surgeon: Ovidio Kin, MD;  Location: [facility];  Service: General;  Laterality: Left;   INCISION AND DRAINAGE PERIRECTAL ABSCESS N/A 04/07/2021   Procedure: DEBRIDEMENT OF SACRAL DECUBITUS ULCER;  Surgeon: Quentin Ore, MD;  Location: [facility];  Service: General;  Laterality: N/A;   INSERTION OF SUPRAPUBIC CATHETER N/A 09/06/2019   Procedure: SUPRAPUBIC TUBE CHANGE;  Surgeon: Crista Elliot, MD;  Location: [facility];  Service: Urology;  Laterality: N/A;   INSERTION OF SUPRAPUBIC CATHETER N/A 04/10/2020   Procedure: SUPRAPUBIC CATHETER EXCHANGE/TRACT  DILATION;  Surgeon: Jerilee Field, MD;  Location: [facility];  Service: Urology;  Laterality: N/A;   INSERTION OF SUPRAPUBIC CATHETER N/A 09/03/2021   Procedure: SUPRAPUBIC CATHETER EXCHANGE;  Surgeon: Crista Elliot, MD;  Location: [facility];  Service: Urology;  Laterality: N/A;  45 MINS FOR THIS CASE   INSERTION OF SUPRAPUBIC CATHETER N/A 04/01/2022   Procedure: SUPRAPUBIC CATHETER EXCHANGE UPSIZING & DILATION OF SUPRAPUBIC  TRACT;  Surgeon: Crista Elliot, MD;  Location: [facility];  Service: Urology;  Laterality: N/A;  45 MINS FOR CASE   INSERTION OF SUPRAPUBIC CATHETER N/A 05/27/2022   Procedure: CYSTOSCOPY SUPRAPUBIC TUBE TRACT DILATION AND SUPRAPUBIC TUBE EXCHANGE;  Surgeon: Crista Elliot,  MD;  Location: [facility];  Service: Urology;  Laterality: N/A;  45 MINS FOR CASE   INSERTION OF SUPRAPUBIC CATHETER N/A 11/04/2022   Procedure: SUPRAPUBIC CATHETER CHANGE;  Surgeon: Crista Elliot, MD;  Location: [facility];  Service: Urology;  Laterality: N/A;  45 MINS FOR CASE   IR CATHETER TUBE CHANGE  10/13/2017   IR CATHETER TUBE CHANGE  07/15/2018   IR CATHETER TUBE CHANGE  08/26/2019   IR CATHETER TUBE CHANGE  11/25/2019   IR CATHETER TUBE CHANGE  01/06/2020   IR CATHETER TUBE CHANGE  06/13/2020   IR CATHETER TUBE CHANGE  08/01/2020   LAPAROSCOPY N/A 04/07/2021   Procedure: LAPAROSCOPY DIAGNOSTIC; LAPAROSCOPIC CREATION OF TRANSVERSE COLOSTOMY;  Surgeon: Quentin Ore, MD;  Location: [facility];  Service: General;  Laterality: N/A;  left hand carpal tunnel surgery     REPLACEMENT TOTAL KNEE BILATERAL  07/01/2002    Home Medications:  Medications Prior to Admission  Medication Sig Dispense Refill Last Dose/Taking   acetaminophen (TYLENOL) 325 MG tablet Take 650 mg by mouth every 6 (six) hours as needed for mild pain (pain score 1-3), fever or moderate pain (pain score 4-6). Fever <101   Past Week   acetic acid 0.25 % irrigation Irrigate with 1 Application as directed every Monday, Wednesday, and Friday. 30 ml   Past Week   Amino Acids-Protein Hydrolys (FEEDING SUPPLEMENT, PRO-STAT SUGAR FREE 64,) LIQD Take 30 mLs by mouth 2 (two) times daily.   Past Week   bacitracin 500 UNIT/GM ointment Apply 1 Application topically See admin instructions. Apply to suprapubic catheter site with dressing every day shift   06/12/2023   baclofen (LIORESAL) 10 MG tablet Take 15-20 mg by mouth See admin instructions. Take 15 mg by mouth in the morning, and 20 mg at bedtime   06/13/2023 at  8:00 AM   barrier cream (NON-SPECIFIED) CREA Apply 1 application  topically See admin instructions. Apply to right ischium and bilateral buttocks 2 times a day   06/12/2023   bisacodyl (DULCOLAX) 10 MG suppository Place 10  mg rectally daily as needed for moderate constipation (constipation not relieved by milk of magnesium).   Past Week   calcium citrate (CALCITRATE - DOSED IN MG ELEMENTAL CALCIUM) 950 (200 Ca) MG tablet Take 200 mg of elemental calcium by mouth daily. (0800)   Past Week   cholecalciferol (VITAMIN D) 25 MCG (1000 UNIT) tablet Take 1,000 Units by mouth daily. (1000)   Past Week   Dimethyl Fumarate 240 MG CPDR One po bid (Patient taking differently: Take 240 mg by mouth 2 (two) times daily.) 180 capsule 4 06/13/2023 at  8:00 AM   Dulaglutide (TRULICITY) 3 MG/0.5ML SOPN Inject 3 mg into the skin every Tuesday. (1000)   06/06/2023   DULoxetine (CYMBALTA) 30 MG capsule Take 90 mg by mouth daily.   06/13/2023 at  8:00 AM   FEROSUL 325 (65 Fe) MG tablet Take 325 mg by mouth 2 (two) times daily with a meal.   06/12/2023   fluocinonide cream (LIDEX) 0.05 % Apply 1 Application topically every other day. As needed for scaling and itching   Past Week   furosemide (LASIX) 20 MG tablet Take 20 mg by mouth See admin instructions. Take 20 mg two times a day every other day   Past Week   gabapentin (NEURONTIN) 100 MG capsule Take 200 mg by mouth 3 (three) times daily.   06/13/2023 at  8:00 AM   ketoconazole (NIZORAL) 2 % cream Apply 1 Application topically 2 (two) times daily. Apply to face and ears   Past Week   ketoconazole (NIZORAL) 2 % shampoo Apply 1 Application topically every Monday.   Past Week   magnesium hydroxide (MILK OF MAGNESIA) 400 MG/5ML suspension Take 30 mLs by mouth daily as needed for mild constipation.   Past Week   magnesium oxide (MAG-OX) 400 MG tablet Take 400 mg by mouth 2 (two) times daily. (1000 & 2200)   Past Week   methimazole (TAPAZOLE) 5 MG tablet Take 5 mg by mouth daily at 6 (six) AM.   06/13/2023 at  8:00 AM   metoprolol succinate (TOPROL-XL) 50 MG 24 hr tablet Take 50 mg by mouth daily after breakfast. (0800)   06/13/2023 at  8:00 AM   Multiple Vitamin (MULTIVITAMIN  WITH MINERALS) TABS  tablet Take 1 tablet by mouth in the morning. (1000)   Past Week   NON FORMULARY Take 120 mLs by mouth See admin instructions. Med[employer]- Drink 120 ml's by mouth three times a day if Glucerna id not available   Taking   ondansetron (ZOFRAN) 4 MG tablet Take 4 mg by mouth every 6 (six) hours as needed for nausea.   Taking As Needed   pantoprazole (PROTONIX) 40 MG tablet Take 40 mg by mouth daily at 6 (six) AM. (0600)   06/13/2023 at  8:00 AM   polyethylene glycol (MIRALAX / GLYCOLAX) 17 g packet Take 17 g by mouth daily.   Taking   predniSONE (DELTASONE) 2.5 MG tablet Take 2.5 mg by mouth every other day. Alternating days with 5 mg dose   Taking   predniSONE (DELTASONE) 5 MG tablet Take 5 mg by mouth every other day. Alternating days with 2.5 mg dose   06/13/2023 at  8:00 AM   SF 5000 PLUS 1.1 % CREA dental cream Place 1 application  onto teeth every morning. Pea size  Spit our excess and do not rinse water   06/12/2023   Sodium Phosphates (RA SALINE ENEMA RE) Place 1 enema rectally daily as needed (severe constipation (not relieved by bisacodyl) AND CALL MD IF NO RELIEF FROM ENEMA).   Taking As Needed   solifenacin (VESICARE) 10 MG tablet Take 10 mg by mouth daily.   06/13/2023 at  8:00 AM   vitamin C (ASCORBIC ACID) 500 MG tablet Take 500 mg by mouth in the morning and at bedtime. (1000 & 2200)   Past Week   Allergies:  Allergies  Allergen Reactions   Codeine Shortness Of Breath and Other (See Comments)    Difficult breathing and skin problem   Ultram [Tramadol] Shortness Of Breath and Other (See Comments)    Difficult breathing and skin peeling   Januvia [Sitagliptin] Rash and Other (See Comments)    Blisters    Family History  Problem Relation Age of Onset   Multiple sclerosis Other        neices.    Cancer Mother    Diabetes Mother    GI Bleed Sister        diverticulitis   Thyroid disease Neg Hx    Social History:  reports that she has never smoked. She has never used smokeless  tobacco. She reports that she does not drink alcohol and does not use drugs.  ROS: A complete review of systems was performed.  All systems are negative except for pertinent findings as noted. ROS   Physical Exam:  Vital signs in last 24 hours: Temp:  [98.2 F (36.8 C)] 98.2 F (36.8 C) (12/13 1018) Pulse Rate:  [82] 82 (12/13 1018) Resp:  [20] 20 (12/13 1018) BP: (134)/(77) 134/77 (12/13 1018) SpO2:  [94 %] 94 % (12/13 1018) General:  Alert and oriented, No acute distress HEENT: Normocephalic, atraumatic Neck: No JVD or lymphadenopathy Cardiovascular: Regular rate and rhythm Lungs: Regular rate and effort Abdomen: Soft, nontender, nondistended, no abdominal masses Back: No CVA tenderness Extremities: No edema Neurologic: Grossly intact  Laboratory Data:  Results for orders placed or performed during the hospital encounter of 06/13/23 (from the past 24 hours)  Glucose, capillary     Status: None   Collection Time: 06/13/23 10:14 AM  Result Value Ref Range   Glucose-Capillary 97 70 - 99 mg/dL   Comment 1 Notify RN    Comment 2  Document in Chart   Basic metabolic panel per protocol     Status: Abnormal   Collection Time: 06/13/23 10:15 AM  Result Value Ref Range   Sodium 136 135 - 145 mmol/L   Potassium 4.9 3.5 - 5.1 mmol/L   Chloride 96 (L) 98 - 111 mmol/L   CO2 30 22 - 32 mmol/L   Glucose, Bld 114 (H) 70 - 99 mg/dL   BUN 31 (H) 8 - 23 mg/dL   Creatinine, Ser 8.65 0.44 - 1.00 mg/dL   Calcium 9.8 8.9 - 78.4 mg/dL   GFR, Estimated >69 >62 mL/min   Anion gap 10 5 - 15  CBC per protocol     Status: None   Collection Time: 06/13/23 10:15 AM  Result Value Ref Range   WBC 10.5 4.0 - 10.5 K/uL   RBC 4.60 3.87 - 5.11 MIL/uL   Hemoglobin 13.8 12.0 - 15.0 g/dL   HCT 95.2 84.1 - 32.4 %   MCV 91.7 80.0 - 100.0 fL   MCH 30.0 26.0 - 34.0 pg   MCHC 32.7 30.0 - 36.0 g/dL   RDW 40.1 02.7 - 25.3 %   Platelets 274 150 - 400 K/uL   nRBC 0.0 0.0 - 0.2 %   No results found for  this or any previous visit (from the past 240 hours). Creatinine: Recent Labs    06/13/23 1015  CREATININE 0.58    Impression/Assessment:  Neurogenic bladder  Plan:  Proceed with cystoscopy with intra detrusor Botox injection and suprapubic tube exchange  Ray Church, III 06/13/2023, 12:43 PM

## 2023-06-13 NOTE — Anesthesia Postprocedure Evaluation (Signed)
Anesthesia Post Note  Patient: Laura Roberts  Procedure(s) Performed: CYSTOSCOPY BOTOX INJECTION exchange OF SUPRAPUBIC CATHETER     Patient location during evaluation: PACU Anesthesia Type: General Level of consciousness: awake and alert Pain management: pain level controlled Vital Signs Assessment: post-procedure vital signs reviewed and stable Respiratory status: spontaneous breathing, nonlabored ventilation, respiratory function stable and patient connected to nasal cannula oxygen Cardiovascular status: blood pressure returned to baseline and stable Postop Assessment: no apparent nausea or vomiting Anesthetic complications: no   No notable events documented.  Last Vitals:  Vitals:   06/13/23 1415 06/13/23 1434  BP: 115/64 107/69  Pulse: 80 83  Resp: 10   Temp:    SpO2: 93% 95%    Last Pain:  Vitals:   06/13/23 1434  TempSrc:   PainSc: 0-No pain                 Trevor Iha

## 2023-06-13 NOTE — Transfer of Care (Signed)
Immediate Anesthesia Transfer of Care Note  Patient: Laura Roberts  Procedure(s) Performed: CYSTOSCOPY BOTOX INJECTION exchange OF SUPRAPUBIC CATHETER  Patient Location: PACU  Anesthesia Type:General  Level of Consciousness: awake and patient cooperative  Airway & Oxygen Therapy: Patient Spontanous Breathing and Patient connected to face mask oxygen  Post-op Assessment: Report given to RN and Post -op Vital signs reviewed and stable  Post vital signs: Reviewed and stable  Last Vitals:  Vitals Value Taken Time  BP 110/70 06/13/23 1337  Temp    Pulse 79 06/13/23 1338  Resp 12 06/13/23 1338  SpO2 100 % 06/13/23 1338  Vitals shown include unfiled device data.  Last Pain:  Vitals:   06/13/23 1018  TempSrc: Oral  PainSc:          Complications: No notable events documented.

## 2023-06-13 NOTE — Progress Notes (Signed)
Pharmacy Antibiotic Note  Laura Roberts is a 82 y.o. female admitted on 06/13/2023 for  surgery.  Pharmacy has been consulted for meropenem dosing.  Plan: Meropenem 1 gm IV x 1 dose preop Pharmacy to sign off    No data recorded.  No results for input(s): "WBC", "CREATININE", "LATICACIDVEN", "VANCOTROUGH", "VANCOPEAK", "VANCORANDOM", "GENTTROUGH", "GENTPEAK", "GENTRANDOM", "TOBRATROUGH", "TOBRAPEAK", "TOBRARND", "AMIKACINPEAK", "AMIKACINTROU", "AMIKACIN" in the last 168 hours.  CrCl cannot be calculated (Patient's most recent lab result is older than the maximum 21 days allowed.).    Allergies  Allergen Reactions   Codeine Shortness Of Breath and Other (See Comments)    Difficult breathing and skin problem   Ultram [Tramadol] Shortness Of Breath and Other (See Comments)    Difficult breathing and skin peeling   Januvia [Sitagliptin] Rash and Other (See Comments)    Blisters   Thank you for allowing pharmacy to be a part of this patient's care.  Herby Abraham, Pharm.D Use secure chat for questions 06/13/2023 10:15 AM

## 2023-06-13 NOTE — Op Note (Signed)
Preoperative diagnosis:  1.  Neurogenic bladder   Postoperative diagnosis: 1.  Neurogenic bladder   Procedure(s): 1.  Cystoscopy with 200 units of intra detrusor Botox 2.  Simple suprapubic tube exchange   Surgeon: Modena Slater, MD   Assistants: None   Anesthesia: General   Complications: None immediate   EBL: Minimal   Specimens: 1.  None   Drains/Catheters: 1.  20 French suprapubic tube   Intraoperative findings: She has a very patulous urethra.  Wide open.  Bladder mucosa with chronic catheter edema.  No obvious tumors.  Small capacity bladder.   Indication: 82 year old female with neurogenic bladder managed with intra detrusor Botox and suprapubic tube presents for the previously mentioned operation.   Description of procedure:   The patient was identified and consent was obtained.  The patient was taken to the operating room and placed in the supine position.  The patient was placed under general anesthesia.  Perioperative antibiotics were administered.  The patient was placed in dorsal lithotomy.  Patient was prepped and draped in a standard sterile fashion and a timeout was performed.   A rigid cystoscope was advanced into the urethra and into the bladder.  Complete cystoscopy was performed with findings noted above.  I then systematically injected 200 units of Botox throughout the bladder.  There is no significant active bleeding and I withdrew the scope.  20 French suprapubic tube was replaced and this concluded the operation.  Patient tolerated the procedure well was stable postoperatively.   Plan: Repeat Botox in 6 months.

## 2023-06-14 ENCOUNTER — Encounter (HOSPITAL_COMMUNITY): Payer: Self-pay | Admitting: Urology

## 2023-08-25 NOTE — Progress Notes (Unsigned)
 [facility]  PATIENT: Laura Roberts DOB: Mar 21, 1941  REFERRING DOCTOR OR PCP: Jaci Lazier MD SOURCE: Patient, notes from neurology, imaging and lab reports, MRI images personally reviewed. _________________________________  HISTORICAL  CHIEF COMPLAINT:  Chief Complaint  Patient presents with   Follow-up    Pt in 14,  Pt is here for MS follow up. Pt states since her medication dimethyl furamate was changed to generic, she has noticed her hair if falling out. States that she is having spasms on her legs and at times they go numb.    HISTORY OF PRESENT ILLNESS:   Update 08/26/23 SS: On a stretcher, accompanied by transport service.  Has not been seen since October 2023 with Dr. Epimenio Foot.  At the time switched from brand-name Tecfidera to generic dimethyl fumarate.  She was hospitalized in August 2024 with sepsis that was ruled out.  No source ever identified.  Had chronic osteomyelitis to the sacrum.  Has chronic colostomy, suprapubic catheter.  Received bladder Botox for neurogenic bladder. Mentions hair loss, feels spasms in her legs, spasms. Generic dimethyl fumerate has changed colors in pills several times, feels since then, having "relapse", equates this with pain and spasms. Goes to restorative therapy, works with legs. Sacrum wound is healing, remains on daily antibiotics. Has an Mining engineer wheelchair.  Absolute lymphocyte count 1.3, WBC 10.3, alkaline phosphatase 106, AST 10, ALT 25 Aug 2023.  Update 04/30/2022 MS History: She was diagnosed with MS in 2006 after presenting with leg issues.   MRI of the spine showed some foci worrisome for MS and she was referred to Dr. Sandria Manly.    She had CSF analysis that was reportedly abnormal ad c/w MS (I can't find results).      She was placed on Rebif.   She was switched to Tecfidera around 2014 and she has tolerated it well.    She has had no recent MRI.     She had spinal stenosis and spinal cord compression in 2007 and  she had difficulty with her gait and leg strength.     Currently,  she has left arm weakness and severe bilateral leg weakness.   A Hoyer lift is needed.   She has complete numbness in her legs and groin.   She now has a suprapubic catheter and a colostomy.   She had a sacral decubitus and needed debridement and a drain.   The wound is closing now.    Vision is doing well.      She has fatigue and occasionally takes a nap   Some nights she has trouble sleeping.  She feels depressed liing in a nursing home.     Imaging: MRI of the brain 03/09/2021 showed scattered T2/FLAIR hyperintense foci in the periventricular, juxtacortical and deep white matter of both hemispheres.  Couple small foci are noted in the pons.  The cerebellum appears normal.  Stable compared to the MRI of 03/21/2017.  MRI of the cervical spine 03/09/2021 shows a T2 hyperintense focus posteriorly adjacent to C2-C3. There is multilevel degenerative changes causing mild spinal stenosis at C4-C5.  The thyroid is enlarged with heterogenous signal.  She has a history of multinodular goiter.  MRI of the lumbar and thoracic spine 10/14/2005 and 10/19/2005 showed spinal stenosis with cord compression and myelopathic signal with enhancement at T11-T12 (unlikely to be a demyelinating plaque).  Follow-up thoracic spine images 11/30/2005 showed decompression from T11-L1.  Additionally, there is a focus within the spinal cord adjacent to T6-T7  consistent with a demyelinating plaque  Laboratory CBC with differential 01/29/2022 showed normal lymphocyte count.  REVIEW OF SYSTEMS: See HPI  ALLERGIES: Allergies  Allergen Reactions   Codeine Shortness Of Breath and Other (See Comments)    Difficult breathing and skin problem   Ultram [Tramadol] Shortness Of Breath and Other (See Comments)    Difficult breathing and skin peeling   Januvia [Sitagliptin] Rash and Other (See Comments)    Blisters    HOME MEDICATIONS:  Current Outpatient Medications:     acetaminophen (TYLENOL) 325 MG tablet, Take 650 mg by mouth every 6 (six) hours as needed for mild pain (pain score 1-3), fever or moderate pain (pain score 4-6). Fever <101, Disp: , Rfl:    acetic acid 0.25 % irrigation, Irrigate with 1 Application as directed every Monday, Wednesday, and Friday. 30 ml, Disp: , Rfl:    Amino Acids-Protein Hydrolys (FEEDING SUPPLEMENT, PRO-STAT SUGAR FREE 64,) LIQD, Take 30 mLs by mouth 2 (two) times daily., Disp: , Rfl:    bacitracin 500 UNIT/GM ointment, Apply 1 Application topically See admin instructions. Apply to suprapubic catheter site with dressing every day shift, Disp: , Rfl:    baclofen (LIORESAL) 10 MG tablet, Take 15-20 mg by mouth See admin instructions. Take 15 mg by mouth in the morning, and 20 mg at bedtime, Disp: , Rfl:    bisacodyl (DULCOLAX) 10 MG suppository, Place 10 mg rectally daily as needed for moderate constipation (constipation not relieved by milk of magnesium)., Disp: , Rfl:    calcium citrate (CALCITRATE - DOSED IN MG ELEMENTAL CALCIUM) 950 (200 Ca) MG tablet, Take 200 mg of elemental calcium by mouth daily. (0800), Disp: , Rfl:    cholecalciferol (VITAMIN D) 25 MCG (1000 UNIT) tablet, Take 1,000 Units by mouth daily. (1000), Disp: , Rfl:    Dimethyl Fumarate 240 MG CPDR, One po bid (Patient taking differently: Take 240 mg by mouth 2 (two) times daily.), Disp: 180 capsule, Rfl: 4   Dulaglutide (TRULICITY) 3 MG/0.5ML SOPN, Inject 3 mg into the skin every Tuesday. (1000), Disp: , Rfl:    DULoxetine (CYMBALTA) 30 MG capsule, Take 90 mg by mouth daily., Disp: , Rfl:    FEROSUL 325 (65 Fe) MG tablet, Take 325 mg by mouth 2 (two) times daily with a meal., Disp: , Rfl:    fluocinonide cream (LIDEX) 0.05 %, Apply 1 Application topically every other day. As needed for scaling and itching, Disp: , Rfl:    furosemide (LASIX) 20 MG tablet, Take 20 mg by mouth See admin instructions. Take 20 mg two times a day every other day, Disp: , Rfl:     gabapentin (NEURONTIN) 100 MG capsule, Take 200 mg by mouth 3 (three) times daily., Disp: , Rfl:    ketoconazole (NIZORAL) 2 % cream, Apply 1 Application topically 2 (two) times daily. Apply to face and ears, Disp: , Rfl:    ketoconazole (NIZORAL) 2 % shampoo, Apply 1 Application topically every Monday., Disp: , Rfl:    magnesium hydroxide (MILK OF MAGNESIA) 400 MG/5ML suspension, Take 30 mLs by mouth daily as needed for mild constipation., Disp: , Rfl:    methimazole (TAPAZOLE) 5 MG tablet, Take 5 mg by mouth daily at 6 (six) AM., Disp: , Rfl:    metoprolol succinate (TOPROL-XL) 50 MG 24 hr tablet, Take 50 mg by mouth daily after breakfast. (0800), Disp: , Rfl:    Multiple Vitamin (MULTIVITAMIN WITH MINERALS) TABS tablet, Take 1 tablet by mouth in the  morning. (1000), Disp: , Rfl:    ondansetron (ZOFRAN) 4 MG tablet, Take 4 mg by mouth every 6 (six) hours as needed for nausea., Disp: , Rfl:    pantoprazole (PROTONIX) 40 MG tablet, Take 40 mg by mouth daily at 6 (six) AM. (0600), Disp: , Rfl:    polyethylene glycol (MIRALAX / GLYCOLAX) 17 g packet, Take 17 g by mouth daily., Disp: , Rfl:    predniSONE (DELTASONE) 2.5 MG tablet, Take 2.5 mg by mouth every other day. Alternating days with 5 mg dose, Disp: , Rfl:    predniSONE (DELTASONE) 5 MG tablet, Take 5 mg by mouth every other day. Alternating days with 2.5 mg dose, Disp: , Rfl:    SF 5000 PLUS 1.1 % CREA dental cream, Place 1 application  onto teeth every morning. Pea size  Spit our excess and do not rinse water, Disp: , Rfl:    solifenacin (VESICARE) 10 MG tablet, Take 10 mg by mouth daily., Disp: , Rfl:    vitamin C (ASCORBIC ACID) 500 MG tablet, Take 500 mg by mouth in the morning and at bedtime. (1000 & 2200), Disp: , Rfl:    barrier cream (NON-SPECIFIED) CREA, Apply 1 application  topically See admin instructions. Apply to right ischium and bilateral buttocks 2 times a day (Patient not taking: Reported on 08/26/2023), Disp: , Rfl:    magnesium  oxide (MAG-OX) 400 MG tablet, Take 400 mg by mouth 2 (two) times daily. (1000 & 2200), Disp: , Rfl:    NON FORMULARY, Take 120 mLs by mouth See admin instructions. MedPass- Drink 120 ml's by mouth three times a day if Glucerna id not available (Patient not taking: Reported on 08/26/2023), Disp: , Rfl:    Sodium Phosphates (RA SALINE ENEMA RE), Place 1 enema rectally daily as needed (severe constipation (not relieved by bisacodyl) AND CALL MD IF NO RELIEF FROM ENEMA). (Patient not taking: Reported on 08/26/2023), Disp: , Rfl:   PAST MEDICAL HISTORY: Past Medical History:  Diagnosis Date   Age related osteoporosis    Anemia    Aphasia    Bronchitis    hx of    Cellulitis and abscess of toe    hx of    Closed supracondylar fracture of right femur, periprosthetic 06/01/2014   Complicated UTI (urinary tract infection) 10/09/2017   Constipation    COPD (chronic obstructive pulmonary disease) (HCC)    Cystostomy in place [facility])    Decubitus ulcer    Decubitus ulcer of ischial area    Right   Degenerative arthritis    osteoarthritis    Depression    Diabetes mellitus without complication (HCC)    type 2    Edema    localized    Full incontinence of feces    GERD (gastroesophageal reflux disease)    Gout    hx of    History of falling    Hyperlipidemia    Hypertension    Hyperthyroidism    Intertrochanteric fracture (HCC)    Left, chronic   Left perineal ischial pressure ulcer 09/18/2021   Multiple sclerosis (HCC)    Advanced   Neurogenic bowel    Neuromuscular disorder (HCC)    Bilateral hand carpal tunnel syndrome   Neuromuscular dysfunction of bladder    Nontoxic multinodular goiter    Obesity    Osteomyelitis of coccyx (HCC) 04/05/2021   Osteoporosis    Other adrenocortical insufficiency (HCC)    Paraplegia (HCC)    LEGS  Personal history of urinary (tract) infections    Polyneuropathy    Pressure ulcer of right buttock, stage 4 (HCC)    Sacral decubitus ulcer     stage IV decubitus ulcer   Sacral osteomyelitis (HCC) 04/05/2021   Spinal stenosis in cervical region    Spinal stenosis of lumbosacral region    Spinal stenosis, thoracic    Tinea unguium    Vitamin D deficiency     PAST SURGICAL HISTORY: Past Surgical History:  Procedure Laterality Date   ABDOMINAL HYSTERECTOMY     APPLICATION OF WOUND VAC  04/07/2021   Procedure: APPLICATION OF WOUND VAC;  Surgeon: Quentin Ore, MD;  Location: [facility];  Service: General;;   BACK SURGERY     BOTOX INJECTION N/A 01/30/2018   Procedure: CYSTOSCOPY BOTOX INJECTION, SUPRAPUBIC EXCHANGE;  Surgeon: Crista Elliot, MD;  Location: [facility];  Service: Urology;  Laterality: N/A;   BOTOX INJECTION N/A 09/02/2018   Procedure: BOTOX INJECTION;  Surgeon: Crista Elliot, MD;  Location: [facility];  Service: Urology;  Laterality: N/A;   BOTOX INJECTION N/A 05/10/2019   Procedure: CYSTOSCOPY BOTOX INJECTION, SUPRAPUBIC EXCHANGE;  Surgeon: Crista Elliot, MD;  [facility]: [facility];  Service: Urology;  Laterality: N/A;   BOTOX INJECTION N/A 09/06/2019   Procedure: CYSTOSCOPY BOTOX INJECTION;  Surgeon: Crista Elliot, MD;  [facility]: [facility];  Service: Urology;  Laterality: N/A;   BOTOX INJECTION N/A 04/10/2020   Procedure: CYSTOSCOPY BOTOX INJECTION;  Surgeon: Jerilee Field, MD;  [facility]: [facility];  Service: Urology;  Laterality: N/A;   BOTOX INJECTION N/A 09/13/2020   Procedure: CYSTOSCOPYBOTOX INJECTION SUPRAPUBIC TUBE UPSIZE AND EXCHANGE;  Surgeon: Crista Elliot, MD;  [facility]: [facility];  Service: Urology;  Laterality: N/A;   BOTOX INJECTION N/A 03/07/2021   Procedure: CYSTOSCOPY BOTOX INJECTION AND SUPRAPUBIC TUBE EXCHANGE;  Surgeon: Crista Elliot, MD;  [facility]: [facility];  Service: Urology;  Laterality: N/A;  REQUESTING 1 MINS FOR CASE   BOTOX INJECTION N/A 09/03/2021   Procedure: CYSTOSCOPY BOTOX INJECTION;  Surgeon: Crista Elliot, MD;  [facility]: [facility];  Service: Urology;  Laterality:  N/A;   BOTOX INJECTION N/A 04/01/2022   Procedure: CYSTOSCOPYBOTOX INJECTION;  Surgeon: Crista Elliot, MD;  [facility]: [facility];  Service: Urology;  Laterality: N/A;   BOTOX INJECTION N/A 11/04/2022   Procedure: CYSTOSCOPY BOTOX INJECTION 200 UNITS;  Surgeon: Crista Elliot, MD;  [facility]: [facility];  Service: Urology;  Laterality: N/A;   BOTOX INJECTION N/A 06/13/2023   Procedure: CYSTOSCOPY BOTOX INJECTION;  Surgeon: Crista Elliot, MD;  [facility];  Service: Urology;  Laterality: N/A;  45 minute case   CATARACT EXTRACTION Right    CHOLECYSTECTOMY     COLON SURGERY  2023   w/ colostomy   CYSTOSCOPY N/A 09/02/2018   Procedure: CYSTOSCOPY;  Surgeon: Crista Elliot, MD;  [facility];  Service: Urology;  Laterality: N/A;   CYSTOSTOMY N/A 09/02/2018   Procedure: CYSTOSTOMY SUPRAPUBIC;  Surgeon: Crista Elliot, MD;  [facility]: [facility];  Service: Urology;  Laterality: N/A;  45 MINS   FEMUR IM NAIL Right 06/02/2014   Procedure: INTRAMEDULLARY (IM) RETROGRADE FEMORAL NAILING;  Surgeon: Eulas Post, MD;  Location: [facility];  Service: Orthopedics;  Laterality: Right;   GROIN DISSECTION Left 10/24/2017   Procedure: IRRIGATION AND DEBRIDEMENT, LEFT THIGH ABCESS ;  Surgeon: Ovidio Kin, MD;  [facility]: [facility];  Service: General;  Laterality: Left;   INCISION AND DRAINAGE PERIRECTAL ABSCESS N/A 04/07/2021   Procedure: DEBRIDEMENT OF SACRAL DECUBITUS ULCER;  Surgeon: Quentin Ore, MD;  [facility];  Service: General;  Laterality: N/A;   INSERTION OF SUPRAPUBIC CATHETER N/A 09/06/2019   Procedure: SUPRAPUBIC TUBE CHANGE;  Surgeon: Crista Elliot, MD;  [facility];  Service: Urology;  Laterality: N/A;   INSERTION OF SUPRAPUBIC CATHETER N/A 04/10/2020   Procedure: SUPRAPUBIC CATHETER EXCHANGE/TRACT  DILATION;  Surgeon: Jerilee Field, MD;  [facility];  Service: Urology;  Laterality: N/A;   INSERTION OF SUPRAPUBIC CATHETER N/A 09/03/2021   Procedure:  SUPRAPUBIC CATHETER EXCHANGE;  Surgeon: Crista Elliot, MD;  [facility];  Service: Urology;  Laterality: N/A;  45 MINS FOR THIS CASE   INSERTION OF SUPRAPUBIC CATHETER N/A 04/01/2022   Procedure: SUPRAPUBIC CATHETER EXCHANGE UPSIZING & DILATION OF SUPRAPUBIC  TRACT;  Surgeon: Crista Elliot, MD;  [facility];  Service: Urology;  Laterality: N/A;  45 MINS FOR CASE   INSERTION OF SUPRAPUBIC CATHETER N/A 05/27/2022   Procedure: CYSTOSCOPY SUPRAPUBIC TUBE TRACT DILATION AND SUPRAPUBIC TUBE EXCHANGE;  Surgeon: Crista Elliot, MD;  [facility];  Service: Urology;  Laterality: N/A;  45 MINS FOR CASE   INSERTION OF SUPRAPUBIC CATHETER N/A 11/04/2022   Procedure: SUPRAPUBIC CATHETER CHANGE;  Surgeon: Crista Elliot, MD;  [facility];  Service: Urology;  Laterality: N/A;  45 MINS FOR CASE   INSERTION OF SUPRAPUBIC CATHETER N/A 06/13/2023   Procedure: exchange OF SUPRAPUBIC CATHETER;  Surgeon: Crista Elliot, MD;  [facility];  Service: Urology;  Laterality: N/A;   IR CATHETER TUBE CHANGE  10/13/2017   IR CATHETER TUBE CHANGE  07/15/2018   IR CATHETER TUBE CHANGE  08/26/2019   IR CATHETER TUBE CHANGE  11/25/2019   IR CATHETER TUBE CHANGE  01/06/2020   IR CATHETER TUBE CHANGE  06/13/2020   IR CATHETER TUBE CHANGE  08/01/2020   LAPAROSCOPY N/A 04/07/2021   Procedure: LAPAROSCOPY DIAGNOSTIC; LAPAROSCOPIC CREATION OF TRANSVERSE COLOSTOMY;  Surgeon: Quentin Ore, MD;  [facility];  Service: General;  Laterality: N/A;   left hand carpal tunnel surgery     REPLACEMENT TOTAL KNEE BILATERAL  07/01/2002    FAMILY HISTORY: Family History  Problem Relation Age of Onset   Multiple sclerosis Other        neices.    Cancer Mother    Diabetes Mother    GI Bleed Sister        diverticulitis   Thyroid disease Neg Hx     SOCIAL HISTORY: Social History   Socioeconomic History   Marital status: Widowed    Spouse name: Not on file   Number of children: 2    Years of education: 53yr colleg   Highest education level: Not on file  Occupational History   Occupation: N/A    Employer: [employer]    Comment: [employer]IGHT RES[employer]  Tobacco Use   Smoking status: Never   Smokeless tobacco: Never  Vaping Use   Vaping status: Never Used  Substance and Sexual Activity   Alcohol use: No   Drug use: No   Sexual activity: Never    Birth control/protection: Surgical  Other Topics Concern   Not on file  Social History Narrative   Patient is widowed with 2 children   Patient is right handed   Patient has 2 yrs  of college   Patient drinks 2-3 cups of tea daily   Social Drivers of Health   Financial Resource Strain: Low Risk  (11/13/2017)   Overall Financial Resource Strain (CARDIA)    Difficulty of Paying Living Expenses: Not hard at all  Food Insecurity: No Food Insecurity (02/28/2023)   Hunger Vital Sign    Worried About Running Out of Food in the Last Year: Never true    Ran Out of Food in the Last Year: Never true  Transportation Needs: No Transportation Needs (02/28/2023)   PRAPARE - Administrator, Civil Service (Medical): No    Lack of Transportation (Non-Medical): No  Physical Activity: Sufficiently Active (11/13/2017)   Exercise Vital Sign    Days of Exercise per Week: 7 days    Minutes of Exercise per Session: 30 min  Stress: Stress Concern Present (11/13/2017)   [employer] of Occupational Health - Occupational Stress Questionnaire    Feeling of Stress : Rather much  Social Connections: Moderately Isolated (11/13/2017)   Social Connection and Isolation Panel [NHANES]    Frequency of Communication with Friends and Family: More than three times a week    Frequency of Social Gatherings with Friends and Family: More than three times a week    Attends Religious Services: Never    Database administrator or Organizations: No    Attends Banker Meetings: Never    Marital Status: Widowed  Intimate Partner  Violence: Not At Risk (02/28/2023)   Humiliation, Afraid, Rape, and Kick questionnaire    Fear of Current or Ex-Partner: No    Emotionally Abused: No    Physically Abused: No    Sexually Abused: No     PHYSICAL EXAM  Vitals:   08/26/23 1102  BP: 118/78  Pulse: 74   Physical Exam  General: The patient is alert and cooperative at the time of the examination.  Lying on the stretcher.  Has suprapubic catheter, colostomy.  Skin: No significant peripheral edema is noted.  Neurologic Exam  Mental status: The patient is alert and oriented x 3 at the time of the examination. The patient has apparent normal recent and remote memory, with an apparently normal attention span and concentration ability.  Cranial nerves: Facial symmetry is present. Speech is normal, no aphasia or dysarthria is noted. Extraocular movements are full. Visual fields are full.  Provides very concise history.  Motor: Right upper extremity movement is normal, limited mobility of left upper due to rotator cuff 3/5.  No strength or movement to lower extremities.  Sensory examination: Absent touch to bilateral lower extremities.  Decreased sensation to left arm  Coordination: Able to perform finger-nose-finger with the right, limited mobility of the left  Gait and station: Nonambulatory, on a stretcher    DIAGNOSTIC DATA (LABS, IMAGING, TESTING) - I reviewed patient records, labs, notes, testing and imaging myself where available.  Lab Results  Component Value Date   WBC 10.5 06/13/2023   HGB 13.8 06/13/2023   HCT 42.2 06/13/2023   MCV 91.7 06/13/2023   PLT 274 06/13/2023      Component Value Date/Time   NA 136 06/13/2023 1015   NA 144 03/16/2020 0000   NA 144 03/16/2020 0000   K 4.9 06/13/2023 1015   CL 96 (L) 06/13/2023 1015   CO2 30 06/13/2023 1015   GLUCOSE 114 (H) 06/13/2023 1015   BUN 31 (H) 06/13/2023 1015   BUN 14 03/16/2020 0000   BUN 14 03/16/2020  0000   CREATININE 0.58 06/13/2023 1015    CREATININE 0.56 (L) 06/08/2021 1057   CALCIUM 9.8 06/13/2023 1015   PROT 6.0 (L) 02/28/2023 0848   PROT 6.3 12/07/2019 1103   ALBUMIN 2.8 (L) 02/28/2023 0848   ALBUMIN 3.5 (L) 12/07/2019 1103   AST 33 02/28/2023 0848   ALT 32 02/28/2023 0848   ALKPHOS 63 02/28/2023 0848   BILITOT 1.2 02/28/2023 0848   BILITOT 0.3 12/07/2019 1103   GFRNONAA >60 06/13/2023 1015   GFRAA 90 03/16/2020 0000   GFRAA >90 03/16/2020 0000   Lab Results  Component Value Date   CHOL 156 07/30/2019   HDL 56 07/30/2019   LDLCALC 85 07/30/2019   TRIG 77 07/30/2019   Lab Results  Component Value Date   HGBA1C 6.4 (H) 06/13/2023   No results found for: "VITAMINB12" Lab Results  Component Value Date   TSH 2.34 01/27/2020     ASSESSMENT AND PLAN  1.  Multiple sclerosis 2.  Neurogenic bladder 3.  Paraplegia 4.  Presence of colostomy, suprapubic catheter, chronic osteomyelitis to sacral wound  -Has felt that since switching to generic dimethyl fumarate more MS symptoms such as spasms, nerve pain, hair loss  -Reviewed with Dr. Epimenio Foot, we will continue dimethyl fumarate for now.  Check MRI of the brain, cervical spine, thoracic spine for MS surveillance. -Reviewed recent labs from SNF -Recommend continue exercise, physical therapy as tolerated -Follow-up in 6 months or sooner if needed   Orders Placed This Encounter  Procedures   MR BRAIN WO CONTRAST   MR CERVICAL SPINE WO CONTRAST   MR THORACIC SPINE WO CONTRAST   Otila Kluver, DNP  [facility] [address], [address] [place], [place] [address] [phone]

## 2023-08-26 ENCOUNTER — Telehealth: Payer: Self-pay | Admitting: Neurology

## 2023-08-26 ENCOUNTER — Ambulatory Visit (INDEPENDENT_AMBULATORY_CARE_PROVIDER_SITE_OTHER): Payer: Medicare Other | Admitting: Neurology

## 2023-08-26 VITALS — BP 118/78 | HR 74

## 2023-08-26 DIAGNOSIS — G35 Multiple sclerosis: Secondary | ICD-10-CM

## 2023-08-26 NOTE — Telephone Encounter (Signed)
 LVM asking pt to call back to schedule six month follow up with Dr. Epimenio Foot

## 2023-08-26 NOTE — Patient Instructions (Signed)
 We will order MRI of the brain, cervical spine, thoracic spine for MS surveillance.  For now continue current medications.  Will follow-up in 6 months.  Thanks

## 2023-09-01 ENCOUNTER — Other Ambulatory Visit: Payer: Self-pay | Admitting: Neurology

## 2023-10-14 ENCOUNTER — Ambulatory Visit
Admission: RE | Admit: 2023-10-14 | Discharge: 2023-10-14 | Disposition: A | Discharge status: Home / Self Care | Source: Ambulatory Visit | Attending: Neurology | Admitting: Neurology

## 2023-10-14 DIAGNOSIS — G35 Multiple sclerosis: Secondary | ICD-10-CM

## 2023-10-20 ENCOUNTER — Telehealth: Payer: Self-pay | Admitting: Neurology

## 2023-10-20 DIAGNOSIS — G35 Multiple sclerosis: Secondary | ICD-10-CM

## 2023-10-20 DIAGNOSIS — G8220 Paraplegia, unspecified: Secondary | ICD-10-CM

## 2023-10-20 DIAGNOSIS — M4804 Spinal stenosis, thoracic region: Secondary | ICD-10-CM

## 2023-10-20 NOTE — Telephone Encounter (Signed)
 Patient returned call, verbalized understanding of results. She is aware of urgent referral being placed and will call back Wednesday if not heard from anyone. She would also like results faxed to [facility]

## 2023-10-20 NOTE — Telephone Encounter (Signed)
 April, can you call the patient and let her know. I am going to place urgent referral to neurosurgery for evaluation of MRI thoracic spine showing T11-12 severe cord compression and spinal cord signal hyperintensity. Less significant C4-5 mild cord compression.

## 2023-10-20 NOTE — Telephone Encounter (Signed)
 Fax referral for neurosurgery today at 3:51 pm:  Referral for Neurosurgery fax to [facility]. Phone: [phone], Fax: [phone]

## 2023-10-20 NOTE — Telephone Encounter (Signed)
Referral for Neurosurgery fax to [facility]. Phone: [phone], Fax: [phone]. 

## 2023-10-20 NOTE — Addendum Note (Signed)
 Addended by: Wess Hammed on: 10/20/2023 03:48 PM   Modules accepted: Orders

## 2023-10-20 NOTE — Telephone Encounter (Signed)
 Dr. Godwin Lat would you take a look at patients MRI thoracic and cervical spine imaging?  I last saw her in February 2025 on dimethyl fumarate , reporting more MS symptoms such as spasms, nerve pain. She is nonambulatory at SNF, came to appointment via ambulance transport.   MRI thoracic spine showing severe cord compression and spinal cord signal hyperintensity.  Cervical spine showing diffuse degenerative changes, most at C4-5 with mild cord compression.  IMPRESSION: Scan of the thoracic without contrast showing prominent degenerative change at T11-12 with subluxation and severe cord compression and spinal cord signal hyperintensity.  There are no definite demyelinating lesions noted.   IMPRESSION: MRI scan cervical spine without contrast showing prominent disc degenerative changes throughout most significant at C4-5 where there is mild cord compression.  Ill-defined spinal cord hyperintensity posteriorly at C2 likely represents chronic demyelinating plaque.  Overall no significant change compared with previous MRI from 03/09/2021   IMPRESSION: MRI scan of the brain without contrast showing T2/FLAIR white matter hyperintensities in the pattern and distribution compatible with chronic demyelinating disease.  Overall no significant change compared with previous MRI dated 03/09/2021.

## 2023-10-20 NOTE — Telephone Encounter (Signed)
 Call to patient, no answer. Left message to return call

## 2024-02-11 ENCOUNTER — Ambulatory Visit: Admitting: Neurology

## 2024-03-11 ENCOUNTER — Ambulatory Visit: Admitting: Neurology

## 2024-04-28 ENCOUNTER — Other Ambulatory Visit: Payer: Self-pay | Admitting: Urology

## 2024-06-02 NOTE — Progress Notes (Addendum)
 For Anesthesia: PCP - Elsie Music MD  Cardiologist -  Neurologist-Sarah Gayland MANDES, DNP  last office visit note 08/26/23 in [facility]  Bowel Prep reminder:  Chest x-ray - greater than 1 year EKG - greater than 1 year Stress Test -  ECHO -  Cardiac Cath -  Pacemaker/ICD device last checked: Pacemaker orders received: Device Rep notified:  Spinal Cord Stimulator:  Sleep Study -  CPAP -   Fasting Blood Sugar -  Checks Blood Sugar _____ times a day Date and result of last Hgb A1c-  Last dose of GLP1 agonist- Trulicity GLP1 instructions: Hold 7 days prior to schedule (Hold 24 hours-daily)   Last dose of SGLT-2 inhibitors-  SGLT-2 instructions: Hold 72 hours prior to surgery  Blood Thinner Instructions: Last Dose: Time last taken:  Aspirin Instructions: Last Dose: Time last taken:  Activity level: Can go up a flight of stairs and activities of daily living without stopping and without chest pain and/or shortness of breath   Able to exercise without chest pain and/or shortness of breath   Unable to go up a flight of stairs without chest pain and/or shortness of breath     Anesthesia review:   Patient denies shortness of breath, fever, cough and chest pain at PAT appointment   Patient verbalized understanding of instructions that were reviewed over the telephone.

## 2024-06-02 NOTE — Patient Instructions (Addendum)
 Preop instructions for:   Laura Roberts   Date of Birth:  01/19/1941               Date of Procedure:   Monday, Dec. 15, 2025 Procedure:     CYSTOSCOPY, WITH INJECTION OF BLADDER NECK OR BLADDER WALL  Surgeon: Dr. Sherwood Edison Facility contact:   [facility]  Phone: [phone]       Health Care POA: RN contact name/phone#:  [facility] Coordinator  and Fax #: [phone]   Transportation contact phone#: [facility] [phone]   Please send day of procedure:       Current med list  Medications taken the day of procedure confirm time of nothing by mouth status Patient Demographic info( to include DNR status, problem list, allergies) Bring Insurance card and picture ID     Time to arrive at [facility] 7:45 AM    Report to: Admitting (On your left hand side)    Do not eat solid food or drink liquids past midnight the night before your procedure.(To include any tube feedings-must be discontinued)   Take these morning medications only with sips of water  (or give through gastrostomy or feeding tube) Baclofen  Dimethyl Fumarate  Duloxetine  Gabapentin  Methimazole  Metoprolol  Pantoprazole  Prednisone  Solifenacin May use eyedrops if needed   DO NOT TAKE TRULICITY AFTER Sunday, Jun 06, 2024 (7 DAYS PRIOR TO SURGERY)   Note: No Insulin  or Diabetic meds should be given or taken the morning of the procedure!   Leave all jewelry and other valuables at place where living( no metal or rings to be worn) No contact lens Women-no make-up, no lotions,perfumes,powders   Any questions day of procedure,call  SHORT STAY-[phone]     Sent from :[facility]                   Phone:[phone]                   Fax:[phone]   Sent by :   Natanya Holecek BSN, RN

## 2024-06-03 ENCOUNTER — Telehealth: Payer: Self-pay | Admitting: Neurology

## 2024-06-03 NOTE — Telephone Encounter (Signed)
 Appointment details confirmed

## 2024-06-03 NOTE — Telephone Encounter (Signed)
 Laura Roberts) called to reschedule cancelled appointment due to the weather.

## 2024-06-04 ENCOUNTER — Ambulatory Visit: Admitting: Neurology

## 2024-06-11 NOTE — Progress Notes (Addendum)
 Spoke with Laura Roberts she has confirmed she received the pre op instructions. She stated she will fax BMP, HgbA1c to us  that was drawn yesterday. CBC will be drawn 06/12/24 and will fax results.

## 2024-06-14 ENCOUNTER — Ambulatory Visit (HOSPITAL_COMMUNITY)
Admission: RE | Admit: 2024-06-14 | Discharge: 2024-06-14 | Disposition: A | Source: Home / Self Care | Attending: Urology | Admitting: Urology

## 2024-06-14 ENCOUNTER — Other Ambulatory Visit: Payer: Self-pay

## 2024-06-14 ENCOUNTER — Encounter (HOSPITAL_COMMUNITY): Payer: Self-pay | Admitting: Urology

## 2024-06-14 ENCOUNTER — Ambulatory Visit (HOSPITAL_COMMUNITY): Admitting: Anesthesiology

## 2024-06-14 ENCOUNTER — Encounter (HOSPITAL_COMMUNITY): Admission: RE | Disposition: A | Payer: Self-pay | Source: Home / Self Care | Attending: Urology

## 2024-06-14 DIAGNOSIS — E039 Hypothyroidism, unspecified: Secondary | ICD-10-CM | POA: Diagnosis not present

## 2024-06-14 DIAGNOSIS — E119 Type 2 diabetes mellitus without complications: Secondary | ICD-10-CM | POA: Diagnosis not present

## 2024-06-14 DIAGNOSIS — D638 Anemia in other chronic diseases classified elsewhere: Secondary | ICD-10-CM

## 2024-06-14 DIAGNOSIS — E1140 Type 2 diabetes mellitus with diabetic neuropathy, unspecified: Secondary | ICD-10-CM

## 2024-06-14 DIAGNOSIS — N319 Neuromuscular dysfunction of bladder, unspecified: Secondary | ICD-10-CM | POA: Insufficient documentation

## 2024-06-14 DIAGNOSIS — I10 Essential (primary) hypertension: Secondary | ICD-10-CM | POA: Diagnosis not present

## 2024-06-14 DIAGNOSIS — J449 Chronic obstructive pulmonary disease, unspecified: Secondary | ICD-10-CM | POA: Diagnosis not present

## 2024-06-14 DIAGNOSIS — D649 Anemia, unspecified: Secondary | ICD-10-CM | POA: Insufficient documentation

## 2024-06-14 DIAGNOSIS — Z79899 Other long term (current) drug therapy: Secondary | ICD-10-CM | POA: Diagnosis not present

## 2024-06-14 DIAGNOSIS — K219 Gastro-esophageal reflux disease without esophagitis: Secondary | ICD-10-CM | POA: Insufficient documentation

## 2024-06-14 DIAGNOSIS — F32A Depression, unspecified: Secondary | ICD-10-CM | POA: Diagnosis not present

## 2024-06-14 DIAGNOSIS — Z7985 Long-term (current) use of injectable non-insulin antidiabetic drugs: Secondary | ICD-10-CM | POA: Diagnosis not present

## 2024-06-14 HISTORY — PX: CYSTOSCOPY WITH INJECTION: SHX1424

## 2024-06-14 LAB — GLUCOSE, CAPILLARY
Glucose-Capillary: 131 mg/dL — ABNORMAL HIGH (ref 70–99)
Glucose-Capillary: 129 mg/dL — ABNORMAL HIGH (ref 70–99)

## 2024-06-14 LAB — BASIC METABOLIC PANEL WITH GFR
Anion gap: 10 (ref 5–15)
BUN: 33 mg/dL — ABNORMAL HIGH (ref 8–23)
CO2: 32 mmol/L (ref 22–32)
Calcium: 10 mg/dL (ref 8.9–10.3)
Chloride: 99 mmol/L (ref 98–111)
Creatinine, Ser: 0.63 mg/dL (ref 0.44–1.00)
GFR, Estimated: >60 mL/min (ref >60)
Glucose, Bld: 131 mg/dL — ABNORMAL HIGH (ref 70–99)
Potassium: 4.2 mmol/L (ref 3.5–5.1)
Sodium: 140 mmol/L (ref 135–145)

## 2024-06-14 LAB — CBC
HCT: 39.3 % (ref 36.0–46.0)
Hemoglobin: 12.7 g/dL (ref 12.0–15.0)
MCH: 29.7 pg (ref 26.0–34.0)
MCHC: 32.3 g/dL (ref 30.0–36.0)
MCV: 92 fL (ref 80.0–100.0)
Platelets: 283 K/uL (ref 150–400)
RBC: 4.27 MIL/uL (ref 3.87–5.11)
RDW: 14.8 % (ref 11.5–15.5)
WBC: 10 K/uL (ref 4.0–10.5)
nRBC: 0 % (ref 0.0–0.2)

## 2024-06-14 LAB — HEMOGLOBIN A1C
Hgb A1c MFr Bld: 5.8 % — ABNORMAL HIGH (ref 4.8–5.6)
Mean Plasma Glucose: 119.76 mg/dL

## 2024-06-14 SURGERY — CYSTOSCOPY, WITH INJECTION OF BLADDER NECK OR BLADDER WALL
Anesthesia: General | Site: Bladder

## 2024-06-14 MED ORDER — METOPROLOL SUCCINATE ER 25 MG PO TB24
50.0000 mg | ORAL_TABLET | Freq: Every day | ORAL | Status: AC
  Administered 2024-06-14: 11:00:00 50 mg via ORAL
  Filled 2024-06-14: qty 2

## 2024-06-14 MED ORDER — LIDOCAINE HCL (CARDIAC) PF 100 MG/5ML IV SOSY
PREFILLED_SYRINGE | INTRAVENOUS | Status: DC | PRN
  Administered 2024-06-14: 11:00:00 100 mg via INTRAVENOUS

## 2024-06-14 MED ORDER — SODIUM CHLORIDE 0.9 % IR SOLN: Status: DC | PRN

## 2024-06-14 MED ORDER — CHLORHEXIDINE GLUCONATE 0.12 % MT SOLN
15.0000 mL | Freq: Once | OROMUCOSAL | Status: AC
  Administered 2024-06-14: 10:00:00 15 mL via OROMUCOSAL

## 2024-06-14 MED ORDER — ACETAMINOPHEN 500 MG PO TABS
1000.0000 mg | ORAL_TABLET | Freq: Once | ORAL | Status: AC
  Administered 2024-06-14: 10:00:00 1000 mg via ORAL
  Filled 2024-06-14: qty 2

## 2024-06-14 MED ORDER — INSULIN ASPART 100 UNIT/ML IJ SOLN: 0.0000 [IU] | INTRAMUSCULAR | Status: DC | PRN

## 2024-06-14 MED ORDER — ONABOTULINUMTOXINA 100 UNITS IJ SOLR
INTRAMUSCULAR | Status: AC
  Filled 2024-06-14: qty 200

## 2024-06-14 MED ORDER — OXYCODONE HCL 5 MG/5ML PO SOLN: 5.0000 mg | Freq: Once | ORAL | Status: DC | PRN

## 2024-06-14 MED ORDER — SODIUM CHLORIDE 0.9 % IV SOLN
1.0000 g | Freq: Once | INTRAVENOUS | Status: AC
  Administered 2024-06-14: 11:00:00 1 g via INTRAVENOUS
  Filled 2024-06-14: qty 20

## 2024-06-14 MED ORDER — FENTANYL CITRATE (PF) 100 MCG/2ML IJ SOLN
INTRAMUSCULAR | Status: AC
  Filled 2024-06-14: qty 2

## 2024-06-14 MED ORDER — FENTANYL CITRATE (PF) 100 MCG/2ML IJ SOLN
INTRAMUSCULAR | Status: DC | PRN
  Administered 2024-06-14: 11:00:00 50 ug via INTRAVENOUS

## 2024-06-14 MED ORDER — PHENYLEPHRINE HCL (PRESSORS) 10 MG/ML IV SOLN
INTRAVENOUS | Status: DC | PRN
  Administered 2024-06-14: 12:00:00 80 ug via INTRAVENOUS

## 2024-06-14 MED ORDER — ONABOTULINUMTOXINA 100 UNITS IJ SOLR
INTRAMUSCULAR | Status: DC | PRN
  Administered 2024-06-14: 12:00:00 200 [IU] via INTRAMUSCULAR

## 2024-06-14 MED ORDER — CELECOXIB 200 MG PO CAPS
200.0000 mg | ORAL_CAPSULE | Freq: Once | ORAL | Status: AC
  Administered 2024-06-14: 10:00:00 200 mg via ORAL
  Filled 2024-06-14: qty 1

## 2024-06-14 MED ORDER — FENTANYL CITRATE (PF) 50 MCG/ML IJ SOSY: 25.0000 ug | PREFILLED_SYRINGE | INTRAMUSCULAR | Status: DC | PRN

## 2024-06-14 MED ORDER — SODIUM CHLORIDE (PF) 0.9 % IJ SOLN
INTRAMUSCULAR | Status: AC
  Filled 2024-06-14: qty 20

## 2024-06-14 MED ORDER — PROPOFOL 10 MG/ML IV BOLUS
INTRAVENOUS | Status: AC
  Filled 2024-06-14: qty 20

## 2024-06-14 MED ORDER — DEXAMETHASONE SOD PHOSPHATE PF 10 MG/ML IJ SOLN
INTRAMUSCULAR | Status: DC | PRN
  Administered 2024-06-14: 11:00:00 4 mg via INTRAVENOUS

## 2024-06-14 MED ORDER — ORAL CARE MOUTH RINSE: 15.0000 mL | Freq: Once | OROMUCOSAL | Status: AC

## 2024-06-14 MED ORDER — PROPOFOL 10 MG/ML IV BOLUS
INTRAVENOUS | Status: DC | PRN
  Administered 2024-06-14: 11:00:00 150 mg via INTRAVENOUS

## 2024-06-14 MED ORDER — ONDANSETRON HCL 4 MG/2ML IJ SOLN
INTRAMUSCULAR | Status: AC
  Filled 2024-06-14: qty 2

## 2024-06-14 MED ORDER — STERILE WATER FOR IRRIGATION IR SOLN
Status: DC | PRN
  Administered 2024-06-14: 12:00:00 3000 mL

## 2024-06-14 MED ORDER — OXYCODONE HCL 5 MG PO TABS: 5.0000 mg | ORAL_TABLET | Freq: Once | ORAL | Status: DC | PRN

## 2024-06-14 MED ORDER — LACTATED RINGERS IV SOLN: INTRAVENOUS | Status: DC

## 2024-06-14 MED ORDER — ONDANSETRON HCL 4 MG/2ML IJ SOLN
INTRAMUSCULAR | Status: DC | PRN
  Administered 2024-06-14: 11:00:00 4 mg via INTRAVENOUS

## 2024-06-14 MED ORDER — LIDOCAINE HCL (PF) 2 % IJ SOLN
INTRAMUSCULAR | Status: AC
  Filled 2024-06-14: qty 5

## 2024-06-14 SURGICAL SUPPLY — 16 items
BAG URINE DRAIN 2000ML AR STRL (UROLOGICAL SUPPLIES) IMPLANT
BAG URO CATCHER STRL LF (MISCELLANEOUS) ×1 IMPLANT
CATH FOLEY 2WAY SLVR 5CC 18FR (CATHETERS) IMPLANT
CLOTH BEACON ORANGE TIMEOUT ST (SAFETY) ×1 IMPLANT
GLOVE BIO SURGEON STRL SZ7.5 (GLOVE) ×1 IMPLANT
GOWN STRL REUS W/ TWL XL LVL3 (GOWN DISPOSABLE) ×1 IMPLANT
KIT TURNOVER KIT A (KITS) ×1 IMPLANT
MANIFOLD NEPTUNE II (INSTRUMENTS) ×1 IMPLANT
NDL ASPIRATION 22 (NEEDLE) ×1 IMPLANT
NDL SAFETY ECLIPSE 18X1.5 (NEEDLE) IMPLANT
NEEDLE ASPIRATION 22 (NEEDLE) ×1 IMPLANT
PACK CYSTO (CUSTOM PROCEDURE TRAY) ×1 IMPLANT
SPONGE DRAIN TRACH 4X4 STRL 2S (GAUZE/BANDAGES/DRESSINGS) IMPLANT
SYR CONTROL 10ML LL (SYRINGE) IMPLANT
TUBING CONNECTING 10 (TUBING) IMPLANT
WATER STERILE IRR 3000ML UROMA (IV SOLUTION) ×1 IMPLANT

## 2024-06-14 NOTE — Discharge Instructions (Signed)
 May have some blood in the urine.  This is okay as long as her suprapubic tube is draining

## 2024-06-14 NOTE — H&P (Signed)
 H&P  Chief Complaint: Neurogenic bladder  History of Present Illness: 83 year old female with neurogenic bladder managed with suprapubic tube and intradetrusor Botox  presents for repeat Botox  and suprapubic tube exchange.  Past Medical History:  Diagnosis Date   Age related osteoporosis    Anemia    Aphasia    Bronchitis    hx of    Cellulitis and abscess of toe    hx of    Closed supracondylar fracture of right femur, periprosthetic 06/01/2014   Complicated UTI (urinary tract infection) 10/09/2017   Constipation    COPD (chronic obstructive pulmonary disease) (HCC)    Cystostomy in place [facility])    Decubitus ulcer    Decubitus ulcer of ischial area    Right   Degenerative arthritis    osteoarthritis    Depression    Diabetes mellitus without complication (HCC)    type 2    Edema    localized    Full incontinence of feces    GERD (gastroesophageal reflux disease)    Gout    hx of    History of falling    Hyperlipidemia    Hypertension    Hyperthyroidism    Intertrochanteric fracture (HCC)    Left, chronic   Left perineal ischial pressure ulcer 09/18/2021   Multiple sclerosis    Advanced   Neurogenic bowel    Neuromuscular disorder (HCC)    Bilateral hand carpal tunnel syndrome   Neuromuscular dysfunction of bladder    Nontoxic multinodular goiter    Obesity    Osteomyelitis of coccyx (HCC) 04/05/2021   Osteoporosis    Other adrenocortical insufficiency    Paraplegia (HCC)    LEGS   Personal history of urinary (tract) infections    Polyneuropathy    Pressure ulcer of right buttock, stage 4 (HCC)    Sacral decubitus ulcer    stage IV decubitus ulcer   Sacral osteomyelitis (HCC) 04/05/2021   Spinal stenosis in cervical region    Spinal stenosis of lumbosacral region    Spinal stenosis, thoracic    Tinea unguium    Vitamin D  deficiency    Past Surgical History:  Procedure Laterality Date   ABDOMINAL HYSTERECTOMY     APPLICATION OF WOUND VAC  04/07/2021    Procedure: APPLICATION OF WOUND VAC;  Surgeon: Lyndel Deward PARAS, MD;  Location: [facility];  Service: General;;   BACK SURGERY     BOTOX  INJECTION N/A 01/30/2018   Procedure: CYSTOSCOPY BOTOX  INJECTION, SUPRAPUBIC EXCHANGE;  Surgeon: Carolee Sherwood JONETTA DOUGLAS, MD;  Location: [facility];  Service: Urology;  Laterality: N/A;   BOTOX  INJECTION N/A 09/02/2018   Procedure: BOTOX  INJECTION;  Surgeon: Carolee Sherwood JONETTA DOUGLAS, MD;  Location: [facility];  Service: Urology;  Laterality: N/A;   BOTOX  INJECTION N/A 05/10/2019   Procedure: CYSTOSCOPY BOTOX  INJECTION, SUPRAPUBIC EXCHANGE;  Surgeon: Carolee Sherwood JONETTA DOUGLAS, MD;  [facility]: [facility];  Service: Urology;  Laterality: N/A;   BOTOX  INJECTION N/A 09/06/2019   Procedure: CYSTOSCOPY BOTOX  INJECTION;  Surgeon: Carolee Sherwood JONETTA DOUGLAS, MD;  [facility]: [facility];  Service: Urology;  Laterality: N/A;   BOTOX  INJECTION N/A 04/10/2020   Procedure: CYSTOSCOPY BOTOX  INJECTION;  Surgeon: Nieves Cough, MD;  [facility]: [facility];  Service: Urology;  Laterality: N/A;   BOTOX  INJECTION N/A 09/13/2020   Procedure: CYSTOSCOPYBOTOX INJECTION SUPRAPUBIC TUBE UPSIZE AND EXCHANGE;  Surgeon: Carolee Sherwood JONETTA DOUGLAS, MD;  [facility]: [facility];  Service: Urology;  Laterality: N/A;   BOTOX  INJECTION N/A 03/07/2021  Procedure: CYSTOSCOPY BOTOX  INJECTION AND SUPRAPUBIC TUBE EXCHANGE;  Surgeon: Carolee Sherwood JONETTA DOUGLAS, MD;  [facility]: [facility];  Service: Urology;  Laterality: N/A;  REQUESTING 29 MINS FOR CASE   BOTOX  INJECTION N/A 09/03/2021   Procedure: CYSTOSCOPY BOTOX  INJECTION;  Surgeon: Carolee Sherwood JONETTA DOUGLAS, MD;  [facility]: [facility];  Service: Urology;  Laterality: N/A;   BOTOX  INJECTION N/A 04/01/2022   Procedure: CYSTOSCOPYBOTOX INJECTION;  Surgeon: Carolee Sherwood JONETTA DOUGLAS, MD;  [facility]: [facility];  Service: Urology;  Laterality: N/A;   BOTOX  INJECTION N/A 11/04/2022   Procedure: CYSTOSCOPY BOTOX  INJECTION 200 UNITS;  Surgeon: Carolee Sherwood JONETTA DOUGLAS, MD;  [facility];  Service: Urology;  Laterality: N/A;   BOTOX  INJECTION N/A  06/13/2023   Procedure: CYSTOSCOPY BOTOX  INJECTION;  Surgeon: Carolee Sherwood JONETTA DOUGLAS, MD;  [facility];  Service: Urology;  Laterality: N/A;  45 minute case   CATARACT EXTRACTION Right    CHOLECYSTECTOMY     COLON SURGERY  2023   w/ colostomy   CYSTOSCOPY N/A 09/02/2018   Procedure: CYSTOSCOPY;  Surgeon: Carolee Sherwood JONETTA DOUGLAS, MD;  [facility];  Service: Urology;  Laterality: N/A;   CYSTOSTOMY N/A 09/02/2018   Procedure: CYSTOSTOMY SUPRAPUBIC;  Surgeon: Carolee Sherwood JONETTA DOUGLAS, MD;  [facility]: [facility];  Service: Urology;  Laterality: N/A;  45 MINS   FEMUR IM NAIL Right 06/02/2014   Procedure: INTRAMEDULLARY (IM) RETROGRADE FEMORAL NAILING;  Surgeon: Fonda SHAUNNA Olmsted, MD;  Location: [facility];  Service: Orthopedics;  Laterality: Right;   GROIN DISSECTION Left 10/24/2017   Procedure: IRRIGATION AND DEBRIDEMENT, LEFT THIGH ABCESS ;  Surgeon: Ethyl Lenis, MD;  Location: [facility];  Service: General;  Laterality: Left;   INCISION AND DRAINAGE PERIRECTAL ABSCESS N/A 04/07/2021   Procedure: DEBRIDEMENT OF SACRAL DECUBITUS ULCER;  Surgeon: Lyndel Deward PARAS, MD;  Location: [facility];  Service: General;  Laterality: N/A;   INSERTION OF SUPRAPUBIC CATHETER N/A 09/06/2019   Procedure: SUPRAPUBIC TUBE CHANGE;  Surgeon: Carolee Sherwood JONETTA DOUGLAS, MD;  [facility];  Service: Urology;  Laterality: N/A;   INSERTION OF SUPRAPUBIC CATHETER N/A 04/10/2020   Procedure: SUPRAPUBIC CATHETER EXCHANGE/TRACT  DILATION;  Surgeon: Nieves Cough, MD;  [facility];  Service: Urology;  Laterality: N/A;   INSERTION OF SUPRAPUBIC CATHETER N/A 09/03/2021   Procedure: SUPRAPUBIC CATHETER EXCHANGE;  Surgeon: Carolee Sherwood JONETTA DOUGLAS, MD;  [facility];  Service: Urology;  Laterality: N/A;  45 MINS FOR THIS CASE   INSERTION OF SUPRAPUBIC CATHETER N/A 04/01/2022   Procedure: SUPRAPUBIC CATHETER EXCHANGE UPSIZING & DILATION OF SUPRAPUBIC  TRACT;  Surgeon: Carolee Sherwood JONETTA DOUGLAS, MD;  [facility];  Service: Urology;  Laterality: N/A;   45 MINS FOR CASE   INSERTION OF SUPRAPUBIC CATHETER N/A 05/27/2022   Procedure: CYSTOSCOPY SUPRAPUBIC TUBE TRACT DILATION AND SUPRAPUBIC TUBE EXCHANGE;  Surgeon: Carolee Sherwood JONETTA DOUGLAS, MD;  [facility];  Service: Urology;  Laterality: N/A;  45 MINS FOR CASE   INSERTION OF SUPRAPUBIC CATHETER N/A 11/04/2022   Procedure: SUPRAPUBIC CATHETER CHANGE;  Surgeon: Carolee Sherwood JONETTA DOUGLAS, MD;  [facility];  Service: Urology;  Laterality: N/A;  45 MINS FOR CASE   INSERTION OF SUPRAPUBIC CATHETER N/A 06/13/2023   Procedure: exchange OF SUPRAPUBIC CATHETER;  Surgeon: Carolee Sherwood JONETTA DOUGLAS, MD;  [facility];  Service: Urology;  Laterality: N/A;   IR CATHETER TUBE CHANGE  10/13/2017   IR CATHETER TUBE CHANGE  07/15/2018   IR CATHETER TUBE CHANGE  08/26/2019   IR CATHETER TUBE CHANGE  11/25/2019   IR CATHETER TUBE CHANGE  01/06/2020   IR CATHETER TUBE CHANGE  06/13/2020   IR CATHETER TUBE CHANGE  08/01/2020   LAPAROSCOPY N/A 04/07/2021   Procedure: LAPAROSCOPY DIAGNOSTIC; LAPAROSCOPIC CREATION OF TRANSVERSE COLOSTOMY;  Surgeon: Lyndel Deward PARAS, MD;  Location: [facility];  Service: General;  Laterality: N/A;   left hand carpal tunnel surgery     REPLACEMENT TOTAL KNEE BILATERAL  07/01/2002    Home Medications:  Medications Prior to Admission  Medication Sig Dispense Refill Last Dose/Taking   acetaminophen  (TYLENOL ) 325 MG tablet Take 650 mg by mouth every 6 (six) hours as needed for mild pain (pain score 1-3), fever or moderate pain (pain score 4-6). Fever <101   Past Week   acetic acid 0.25 % irrigation Irrigate with 1 Application as directed every Monday, Wednesday, and Friday. 30 ml   06/13/2024   Amino Acids -Protein Hydrolys (FEEDING SUPPLEMENT, PRO-STAT SUGAR FREE 64,) LIQD Take 30 mLs by mouth 2 (two) times daily.   06/13/2024   baclofen  (LIORESAL ) 10 MG tablet Take 15 mg by mouth every 12 (twelve) hours.   06/13/2024   cholecalciferol  (VITAMIN D ) 25 MCG (1000 UNIT) tablet Take 1,000 Units by  mouth daily. (1000)   06/07/2024   Dimethyl Fumarate  240 MG CPDR TAKE 1 CAPSULE TWICE A DAY 180 capsule 3 06/13/2024   DULoxetine  (CYMBALTA ) 30 MG capsule Take 90 mg by mouth daily.   06/13/2024   FEROSUL 325 (65 Fe) MG tablet Take 325 mg by mouth 2 (two) times daily with a meal.   06/07/2024   furosemide  (LASIX ) 20 MG tablet Take 20 mg by mouth See admin instructions. Take 20 mg two times a day every other day   06/13/2024   gabapentin  (NEURONTIN ) 100 MG capsule Take 200 mg by mouth 3 (three) times daily.   06/13/2024   hydroxypropyl methylcellulose / hypromellose (ISOPTO TEARS / GONIOVISC) 2.5 % ophthalmic solution Place 1 drop into both eyes 4 (four) times daily. Additional drop of needed every 8 hours for dry eyes   06/13/2024   magnesium  oxide (MAG-OX) 400 MG tablet Take 400 mg by mouth 2 (two) times daily. (1000 & 2200)   06/07/2024   methimazole  (TAPAZOLE ) 5 MG tablet Take 5 mg by mouth daily at 6 (six) AM.   06/13/2024   metoprolol  succinate (TOPROL -XL) 50 MG 24 hr tablet Take 50 mg by mouth daily after breakfast. (0800)   06/13/2024   ondansetron  (ZOFRAN ) 4 MG tablet Take 4 mg by mouth every 6 (six) hours as needed for nausea.   06/13/2024   pantoprazole  (PROTONIX ) 40 MG tablet Take 40 mg by mouth daily at 6 (six) AM. (0600)   06/13/2024   polyethylene glycol (MIRALAX  / GLYCOLAX ) 17 g packet Take 17 g by mouth daily.   06/13/2024   predniSONE  (DELTASONE ) 2.5 MG tablet Take 2.5 mg by mouth every other day. Alternating days with 5 mg dose   06/13/2024   predniSONE  (DELTASONE ) 5 MG tablet Take 5 mg by mouth every other day. Alternating days with 2.5 mg dose   06/13/2024   PROTEIN PO Take 30 mLs by mouth 2 (two) times daily. Prostat Liquid Protein   06/13/2024   solifenacin (VESICARE) 10 MG tablet Take 10 mg by mouth daily.   06/13/2024   vitamin C (ASCORBIC ACID ) 500 MG tablet Take 500 mg by mouth in the morning and at bedtime. (1000 & 2200)   06/07/2024  bisacodyl  (DULCOLAX) 10 MG suppository  Place 10 mg rectally daily as needed for moderate constipation (constipation not relieved by milk of magnesium ).   More than a month   Dulaglutide (TRULICITY) 3 MG/0.5ML SOPN Inject 3 mg into the skin every Tuesday. (1000)   05/31/2024   fluocinonide cream (LIDEX) 0.05 % Apply 1 Application topically every other day. As needed for scaling and itching   More than a month   magnesium  hydroxide (MILK OF MAGNESIA) 400 MG/5ML suspension Take 30 mLs by mouth daily as needed for mild constipation.   More than a month   Multiple Vitamin (MULTIVITAMIN WITH MINERALS) TABS tablet Take 1 tablet by mouth in the morning. (1000)   06/07/2024   NON FORMULARY Take 120 mLs by mouth See admin instructions. MedPass- Drink 120 ml's by mouth three times a day if Glucerna id not available (Patient not taking: Reported on 08/26/2023)   Not Taking   Sodium Phosphates (RA SALINE ENEMA RE) Place 1 enema rectally daily as needed (severe constipation (not relieved by bisacodyl ) AND CALL MD IF NO RELIEF FROM ENEMA).   More than a month   Allergies: Allergies[1]  Family History  Problem Relation Age of Onset   Multiple sclerosis Other        neices.    Cancer Mother    Diabetes Mother    GI Bleed Sister        diverticulitis   Thyroid  disease Neg Hx    Social History:  reports that she has never smoked. She has never been exposed to tobacco smoke. She has never used smokeless tobacco. She reports that she does not drink alcohol and does not use drugs.  ROS: A complete review of systems was performed.  All systems are negative except for pertinent findings as noted. ROS   Physical Exam:  Vital signs in last 24 hours: Temp:  [98 F (36.7 C)] 98 F (36.7 C) (12/15 0916) Pulse Rate:  [88] 88 (12/15 0916) Resp:  [18] 18 (12/15 0916) BP: (148)/(81) 148/81 (12/15 0916) SpO2:  [97 %] 97 % (12/15 0916) Weight:  [76.5 kg] 76.5 kg (12/15 0936) General:  Alert and oriented, No acute distress HEENT: Normocephalic,  atraumatic Neck: No JVD or lymphadenopathy Cardiovascular: Regular rate and rhythm Lungs: Regular rate and effort Abdomen: Soft, nontender, nondistended, no abdominal masses Back: No CVA tenderness Extremities: No edema Neurologic: Grossly intact  Laboratory Data:  Results for orders placed or performed during the hospital encounter of 06/14/24 (from the past 24 hours)  Glucose, capillary     Status: Abnormal   Collection Time: 06/14/24  9:33 AM  Result Value Ref Range   Glucose-Capillary 131 (H) 70 - 99 mg/dL   Comment 1 Notify RN   Basic metabolic panel per protocol     Status: Abnormal   Collection Time: 06/14/24 10:00 AM  Result Value Ref Range   Sodium 140 135 - 145 mmol/L   Potassium 4.2 3.5 - 5.1 mmol/L   Chloride 99 98 - 111 mmol/L   CO2 32 22 - 32 mmol/L   Glucose, Bld 131 (H) 70 - 99 mg/dL   BUN 33 (H) 8 - 23 mg/dL   Creatinine, Ser 9.36 0.44 - 1.00 mg/dL   Calcium  10.0 8.9 - 10.3 mg/dL   GFR, Estimated >39 >39 mL/min   Anion gap 10 5 - 15  CBC per protocol     Status: None   Collection Time: 06/14/24 10:00 AM  Result Value Ref Range  WBC 10.0 4.0 - 10.5 K/uL   RBC 4.27 3.87 - 5.11 MIL/uL   Hemoglobin 12.7 12.0 - 15.0 g/dL   HCT 60.6 63.9 - 53.9 %   MCV 92.0 80.0 - 100.0 fL   MCH 29.7 26.0 - 34.0 pg   MCHC 32.3 30.0 - 36.0 g/dL   RDW 85.1 88.4 - 84.4 %   Platelets 283 150 - 400 K/uL   nRBC 0.0 0.0 - 0.2 %   No results found for this or any previous visit (from the past 240 hours). Creatinine: Recent Labs    06/14/24 1000  CREATININE 0.63    Impression/Assessment:  Neurogenic bladder  Plan:  Proceed with 200 units of Botox  and suprapubic tube exchange  Sherwood JONETTA Edison, III 06/14/2024, 11:02 AM      [1]  Allergies Allergen Reactions   Codeine Shortness Of Breath and Other (See Comments)    Difficult breathing and skin problem   Ultram [Tramadol] Shortness Of Breath and Other (See Comments)    Difficult breathing and skin peeling   Lisinopril      Other Reaction(s): cough   Januvia [Sitagliptin] Rash and Other (See Comments)    Blisters

## 2024-06-14 NOTE — Anesthesia Procedure Notes (Signed)
 Procedure Name: LMA Insertion Date/Time: 06/14/2024 11:19 AM  Performed by: Lanning Cena RAMAN, CRNAPre-anesthesia Checklist: Patient identified, Emergency Drugs available, Suction available, Patient being monitored and Timeout performed Patient Re-evaluated:Patient Re-evaluated prior to induction Oxygen Delivery Method: Circle system utilized Preoxygenation: Pre-oxygenation with 100% oxygen Induction Type: IV induction LMA: LMA inserted LMA Size: 4.0 Number of attempts: 1 Placement Confirmation: positive ETCO2 Tube secured with: Tape Dental Injury: Teeth and Oropharynx as per pre-operative assessment

## 2024-06-14 NOTE — Transfer of Care (Signed)
 Immediate Anesthesia Transfer of Care Note  Patient: Laura Roberts  Procedure(s) Performed: CYSTOSCOPY, WITH INJECTION OF BLADDER NECK OR BLADDER WALL (Bladder)  Patient Location: PACU  Anesthesia Type:General  Level of Consciousness: awake and oriented  Airway & Oxygen Therapy: Patient Spontanous Breathing and Patient connected to face mask oxygen  Post-op Assessment: Report given to RN and Post -op Vital signs reviewed and stable  Post vital signs: Reviewed and stable  Last Vitals:  Vitals Value Taken Time  BP 120/62 06/14/24 11:57  Temp    Pulse 85 06/14/24 11:58  Resp 12 06/14/24 11:58  SpO2 100 % 06/14/24 11:58  Vitals shown include unfiled device data.  Last Pain:  Vitals:   06/14/24 1006  TempSrc:   PainSc: 0-No pain      Patients Stated Pain Goal: 4 (06/14/24 0936)  Complications: No notable events documented.

## 2024-06-14 NOTE — Op Note (Signed)
 Preoperative diagnosis:  1.  Neurogenic bladder   Postoperative diagnosis: 1.  Neurogenic bladder   Procedure(s): 1.  Cystoscopy with 200 units of intra detrusor Botox  2.  Simple suprapubic tube exchange   Surgeon: Sherwood Edison, MD   Assistants: None   Anesthesia: General   Complications: None immediate   EBL: Minimal   Specimens: 1.  None   Drains/Catheters: 1.  18 French suprapubic tube   Intraoperative findings: She has a very patulous urethra.  Wide open.  Bladder mucosa with chronic catheter edema.  No obvious tumors.  Small capacity bladder.   Indication: 83 year old female with neurogenic bladder managed with intra detrusor Botox  and suprapubic tube presents for the previously mentioned operation.   Description of procedure:   The patient was identified and consent was obtained.  The patient was taken to the operating room and placed in the supine position.  The patient was placed under general anesthesia.  Perioperative antibiotics were administered.  The patient was placed in dorsal lithotomy.  Patient was prepped and draped in a standard sterile fashion and a timeout was performed.   A rigid cystoscope was advanced into the urethra and into the bladder.  Complete cystoscopy was performed with findings noted above.  I then systematically injected 200 units of Botox  throughout the bladder.  There is no significant active bleeding and I withdrew the scope.  18 French suprapubic tube was replaced and this concluded the operation.  Patient tolerated the procedure well was stable postoperatively.   Plan: Repeat Botox  in 6 months.

## 2024-06-14 NOTE — Anesthesia Preprocedure Evaluation (Signed)
 Anesthesia Evaluation  Patient identified by MRN, date of birth, ID band Patient awake    Reviewed: Allergy & Precautions, NPO status , Patient's Chart, lab work & pertinent test results  Airway Mallampati: II  TM Distance: >3 FB Neck ROM: Full    Dental no notable dental hx. (+) Teeth Intact, Dental Advisory Given,    Pulmonary COPD   Pulmonary exam normal breath sounds clear to auscultation       Cardiovascular hypertension, Pt. on medications Normal cardiovascular exam Rhythm:Regular Rate:Normal     Neuro/Psych  PSYCHIATRIC DISORDERS  Depression     Neuromuscular disease (neurogenic bladder)    GI/Hepatic ,GERD  Medicated,,  Endo/Other  diabetes, Type 2 Hyperthyroidism Trulicity q week  Renal/GU   negative genitourinary   Musculoskeletal  (+) Arthritis , Osteoarthritis,    Abdominal   Peds  Hematology  (+) Blood dyscrasia, anemia Lab Results      Component                Value               Date                      WBC                      10.5                06/13/2023                HGB                      13.8                06/13/2023                HCT                      42.2                06/13/2023                MCV                      91.7                06/13/2023                PLT                      274                 06/13/2023             Lab Results      Component                Value               Date                      NA                       136                 06/13/2023                K  4.9                 06/13/2023                CO2                      30                  06/13/2023                GLUCOSE                  114 (H)             06/13/2023                BUN                      31 (H)              06/13/2023                CREATININE               0.58                06/13/2023                CALCIUM                   9.8                  06/13/2023                EGFR                     93                  06/08/2021                GFRNONAA                 >60                 06/13/2023              Anesthesia Other Findings All: codeine ultram and januvia  Reproductive/Obstetrics                              Anesthesia Physical Anesthesia Plan  ASA: 3  Anesthesia Plan: General   Post-op Pain Management: Tylenol  PO (pre-op)* and Celebrex  PO (pre-op)*   Induction: Intravenous  PONV Risk Score and Plan: 3 and Treatment may vary due to age or medical condition, Ondansetron  and Dexamethasone   Airway Management Planned: LMA and Mask  Additional Equipment: None  Intra-op Plan:   Post-operative Plan: Extubation in OR  Informed Consent: I have reviewed the patients History and Physical, chart, labs and discussed the procedure including the risks, benefits and alternatives for the proposed anesthesia with the patient or authorized representative who has indicated his/her understanding and acceptance.     Dental advisory given  Plan Discussed with: CRNA and Anesthesiologist  Anesthesia Plan Comments:          Anesthesia Quick Evaluation

## 2024-06-15 ENCOUNTER — Encounter (HOSPITAL_COMMUNITY): Payer: Self-pay | Admitting: Urology

## 2024-06-15 NOTE — Anesthesia Postprocedure Evaluation (Signed)
 Anesthesia Post Note  Patient: Laura Roberts  Procedure(s) Performed: CYSTOSCOPY, WITH INJECTION OF BLADDER NECK OR BLADDER WALL (Bladder)     Patient location during evaluation: PACU Anesthesia Type: General Level of consciousness: awake and alert Pain management: pain level controlled Vital Signs Assessment: post-procedure vital signs reviewed and stable Respiratory status: spontaneous breathing, nonlabored ventilation, respiratory function stable and patient connected to nasal cannula oxygen Cardiovascular status: blood pressure returned to baseline and stable Postop Assessment: no apparent nausea or vomiting Anesthetic complications: no   No notable events documented.  Last Vitals:  Vitals:   06/14/24 1215 06/14/24 1230  BP: 118/68 116/68  Pulse: 80 76  Resp: 11 10  Temp:    SpO2: 93% 92%    Last Pain:  Vitals:   06/14/24 1230  TempSrc:   PainSc: 0-No pain                 Cordella P Nicklos Gaxiola

## 2024-07-06 ENCOUNTER — Ambulatory Visit: Admitting: Neurology

## 2024-07-06 VITALS — BP 127/73 | HR 95

## 2024-07-06 DIAGNOSIS — G8220 Paraplegia, unspecified: Secondary | ICD-10-CM

## 2024-07-06 DIAGNOSIS — G35D Multiple sclerosis, unspecified: Secondary | ICD-10-CM | POA: Diagnosis not present

## 2024-07-06 NOTE — Progress Notes (Signed)
 "  [facility]  PATIENT: Laura Roberts DOB: Dec 26, 1940  REFERRING DOCTOR OR PCP: Elsie Music MD SOURCE: Patient, notes from neurology, imaging and lab reports, MRI images personally reviewed. _________________________________  HISTORICAL  CHIEF COMPLAINT:  Chief Complaint  Patient presents with   Follow-up    Pt in room 18. EMT in room. Here for MS follow up   HISTORY OF PRESENT ILLNESS:  Update 07/06/24 SS: MRI thoracic spine showing severe cord compression and spinal cord signal hyperintensity. Cervical spine showing diffuse degenerative changes, most at C4-5 with mild cord compression. She saw neurosurgery, surgery was discussed but since she is not expected to walk, surgery was not pursued. Has chronic pressure ulcer to buttocks, with chronic suprapubic and colostomy. Remains on generic dimethyl fumerate. Feels side effect with generic, hair loss, spasms. Can be all over, the nerves twisting, more in her hands. CTS to right hand, sometimes fingers lock up. Remains on [place]. In stretcher today accompanied with transport, gets up to electric wheelchair, does therapy 5 days a week, attends social activities.   Update 08/26/23 SS: On a stretcher, accompanied by transport service.  Has not been seen since October 2023 with Dr. Vear.  At the time switched from brand-name Tecfidera  to generic dimethyl fumarate .  She was hospitalized in August 2024 with sepsis that was ruled out.  No source ever identified.  Had chronic osteomyelitis to the sacrum.  Has chronic colostomy, suprapubic catheter.  Received bladder Botox  for neurogenic bladder. Mentions hair loss, feels spasms in her legs, spasms. Generic dimethyl fumerate has changed colors in pills several times, feels since then, having relapse, equates this with pain and spasms. Goes to restorative therapy, works with legs. Sacrum wound is healing, remains on daily antibiotics. Has an mining engineer wheelchair.  Absolute  lymphocyte count 1.3, WBC 10.3, alkaline phosphatase 106, AST 10, ALT 25 Aug 2023.  Update 04/30/2022 MS History: She was diagnosed with MS in 2006 after presenting with leg issues.   MRI of the spine showed some foci worrisome for MS and she was referred to Dr. Maurice.    She had CSF analysis that was reportedly abnormal ad c/w MS (I can't find results).      She was placed on Rebif .   She was switched to Tecfidera  around 2014 and she has tolerated it well.    She has had no recent MRI.     She had spinal stenosis and spinal cord compression in 2007 and she had difficulty with her gait and leg strength.     Currently,  she has left arm weakness and severe bilateral leg weakness.   A Hoyer lift is needed.   She has complete numbness in her legs and groin.   She now has a suprapubic catheter and a colostomy.   She had a sacral decubitus and needed debridement and a drain.   The wound is closing now.    Vision is doing well.      She has fatigue and occasionally takes a nap   Some nights she has trouble sleeping.  She feels depressed liing in a nursing home.     Imaging: MRI of the brain 03/09/2021 showed scattered T2/FLAIR hyperintense foci in the periventricular, juxtacortical and deep white matter of both hemispheres.  Couple small foci are noted in the pons.  The cerebellum appears normal.  Stable compared to the MRI of 03/21/2017.  MRI of the cervical spine 03/09/2021 shows a T2 hyperintense focus posteriorly adjacent to  C2-C3. There is multilevel degenerative changes causing mild spinal stenosis at C4-C5.  The thyroid  is enlarged with heterogenous signal.  She has a history of multinodular goiter.  MRI of the lumbar and thoracic spine 10/14/2005 and 10/19/2005 showed spinal stenosis with cord compression and myelopathic signal with enhancement at T11-T12 (unlikely to be a demyelinating plaque).  Follow-up thoracic spine images 11/30/2005 showed decompression from T11-L1.  Additionally, there is a focus  within the spinal cord adjacent to T6-T7 consistent with a demyelinating plaque  Laboratory CBC with differential 01/29/2022 showed normal lymphocyte count.  REVIEW OF SYSTEMS: See HPI  ALLERGIES: Allergies  Allergen Reactions   Codeine Shortness Of Breath and Other (See Comments)    Difficult breathing and skin problem   Ultram [Tramadol] Shortness Of Breath and Other (See Comments)    Difficult breathing and skin peeling   Lisinopril     Other Reaction(s): cough   Januvia [Sitagliptin] Rash and Other (See Comments)    Blisters    HOME MEDICATIONS:  Current Outpatient Medications:    acetaminophen  (TYLENOL ) 325 MG tablet, Take 650 mg by mouth every 6 (six) hours as needed for mild pain (pain score 1-3), fever or moderate pain (pain score 4-6). Fever <101, Disp: , Rfl:    acetic acid 0.25 % irrigation, Irrigate with 1 Application as directed every Monday, Wednesday, and Friday. 30 ml, Disp: , Rfl:    Amino Acids -Protein Hydrolys (FEEDING SUPPLEMENT, PRO-STAT SUGAR FREE 64,) LIQD, Take 30 mLs by mouth 2 (two) times daily., Disp: , Rfl:    baclofen  (LIORESAL ) 10 MG tablet, Take 15 mg by mouth every 12 (twelve) hours., Disp: , Rfl:    bisacodyl  (DULCOLAX) 10 MG suppository, Place 10 mg rectally daily as needed for moderate constipation (constipation not relieved by milk of magnesium )., Disp: , Rfl:    cholecalciferol  (VITAMIN D ) 25 MCG (1000 UNIT) tablet, Take 1,000 Units by mouth daily. (1000), Disp: , Rfl:    Dimethyl Fumarate  240 MG CPDR, TAKE 1 CAPSULE TWICE A DAY, Disp: 180 capsule, Rfl: 3   Dulaglutide (TRULICITY) 3 MG/0.5ML SOPN, Inject 3 mg into the skin every Tuesday. (1000), Disp: , Rfl:    DULoxetine  (CYMBALTA ) 30 MG capsule, Take 90 mg by mouth daily., Disp: , Rfl:    FEROSUL 325 (65 Fe) MG tablet, Take 325 mg by mouth 2 (two) times daily with a meal., Disp: , Rfl:    fluocinonide cream (LIDEX) 0.05 %, Apply 1 Application topically every other day. As needed for scaling and  itching, Disp: , Rfl:    furosemide  (LASIX ) 20 MG tablet, Take 20 mg by mouth See admin instructions. Take 20 mg two times a day every other day, Disp: , Rfl:    gabapentin  (NEURONTIN ) 100 MG capsule, Take 200 mg by mouth 3 (three) times daily., Disp: , Rfl:    hydroxypropyl methylcellulose / hypromellose (ISOPTO TEARS / GONIOVISC) 2.5 % ophthalmic solution, Place 1 drop into both eyes 4 (four) times daily. Additional drop of needed every 8 hours for dry eyes, Disp: , Rfl:    magnesium  hydroxide (MILK OF MAGNESIA) 400 MG/5ML suspension, Take 30 mLs by mouth daily as needed for mild constipation., Disp: , Rfl:    magnesium  oxide (MAG-OX) 400 MG tablet, Take 400 mg by mouth 2 (two) times daily. (1000 & 2200), Disp: , Rfl:    methimazole  (TAPAZOLE ) 5 MG tablet, Take 5 mg by mouth daily at 6 (six) AM., Disp: , Rfl:    metoprolol  succinate (TOPROL -XL)  50 MG 24 hr tablet, Take 50 mg by mouth daily after breakfast. (0800), Disp: , Rfl:    Multiple Vitamin (MULTIVITAMIN WITH MINERALS) TABS tablet, Take 1 tablet by mouth in the morning. (1000), Disp: , Rfl:    NON FORMULARY, Take 120 mLs by mouth See admin instructions. MedPass- Drink 120 ml's by mouth three times a day if Glucerna id not available (Patient not taking: Reported on 08/26/2023), Disp: , Rfl:    ondansetron  (ZOFRAN ) 4 MG tablet, Take 4 mg by mouth every 6 (six) hours as needed for nausea., Disp: , Rfl:    pantoprazole  (PROTONIX ) 40 MG tablet, Take 40 mg by mouth daily at 6 (six) AM. (0600), Disp: , Rfl:    polyethylene glycol (MIRALAX  / GLYCOLAX ) 17 g packet, Take 17 g by mouth daily., Disp: , Rfl:    predniSONE  (DELTASONE ) 2.5 MG tablet, Take 2.5 mg by mouth every other day. Alternating days with 5 mg dose, Disp: , Rfl:    predniSONE  (DELTASONE ) 5 MG tablet, Take 5 mg by mouth every other day. Alternating days with 2.5 mg dose, Disp: , Rfl:    PROTEIN PO, Take 30 mLs by mouth 2 (two) times daily. Prostat Liquid Protein, Disp: , Rfl:    Sodium  Phosphates (RA SALINE ENEMA RE), Place 1 enema rectally daily as needed (severe constipation (not relieved by bisacodyl ) AND CALL MD IF NO RELIEF FROM ENEMA)., Disp: , Rfl:    solifenacin (VESICARE) 10 MG tablet, Take 10 mg by mouth daily., Disp: , Rfl:    vitamin C (ASCORBIC ACID ) 500 MG tablet, Take 500 mg by mouth in the morning and at bedtime. (1000 & 2200), Disp: , Rfl:   PAST MEDICAL HISTORY: Past Medical History:  Diagnosis Date   Age related osteoporosis    Anemia    Aphasia    Bronchitis    hx of    Cellulitis and abscess of toe    hx of    Closed supracondylar fracture of right femur, periprosthetic 06/01/2014   Complicated UTI (urinary tract infection) 10/09/2017   Constipation    COPD (chronic obstructive pulmonary disease) (HCC)    Cystostomy in place [facility])    Decubitus ulcer    Decubitus ulcer of ischial area    Right   Degenerative arthritis    osteoarthritis    Depression    Diabetes mellitus without complication (HCC)    type 2    Edema    localized    Full incontinence of feces    GERD (gastroesophageal reflux disease)    Gout    hx of    History of falling    Hyperlipidemia    Hypertension    Hyperthyroidism    Intertrochanteric fracture (HCC)    Left, chronic   Left perineal ischial pressure ulcer 09/18/2021   Multiple sclerosis    Advanced   Neurogenic bowel    Neuromuscular disorder (HCC)    Bilateral hand carpal tunnel syndrome   Neuromuscular dysfunction of bladder    Nontoxic multinodular goiter    Obesity    Osteomyelitis of coccyx (HCC) 04/05/2021   Osteoporosis    Other adrenocortical insufficiency    Paraplegia (HCC)    LEGS   Personal history of urinary (tract) infections    Polyneuropathy    Pressure ulcer of right buttock, stage 4 (HCC)    Sacral decubitus ulcer    stage IV decubitus ulcer   Sacral osteomyelitis (HCC) 04/05/2021   Spinal stenosis in cervical  region    Spinal stenosis of lumbosacral region    Spinal stenosis,  thoracic    Tinea unguium    Vitamin D  deficiency     PAST SURGICAL HISTORY: Past Surgical History:  Procedure Laterality Date   ABDOMINAL HYSTERECTOMY     APPLICATION OF WOUND VAC  04/07/2021   Procedure: APPLICATION OF WOUND VAC;  Surgeon: Lyndel Deward PARAS, MD;  Location: [facility];  Service: General;;   BACK SURGERY     BOTOX  INJECTION N/A 01/30/2018   Procedure: CYSTOSCOPY BOTOX  INJECTION, SUPRAPUBIC EXCHANGE;  Surgeon: Carolee Sherwood JONETTA DOUGLAS, MD;  Location: [facility];  Service: Urology;  Laterality: N/A;   BOTOX  INJECTION N/A 09/02/2018   Procedure: BOTOX  INJECTION;  Surgeon: Carolee Sherwood JONETTA DOUGLAS, MD;  [facility]: [facility];  Service: Urology;  Laterality: N/A;   BOTOX  INJECTION N/A 05/10/2019   Procedure: CYSTOSCOPY BOTOX  INJECTION, SUPRAPUBIC EXCHANGE;  Surgeon: Carolee Sherwood JONETTA DOUGLAS, MD;  [facility]: [facility];  Service: Urology;  Laterality: N/A;   BOTOX  INJECTION N/A 09/06/2019   Procedure: CYSTOSCOPY BOTOX  INJECTION;  Surgeon: Carolee Sherwood JONETTA DOUGLAS, MD;  [facility];  Service: Urology;  Laterality: N/A;   BOTOX  INJECTION N/A 04/10/2020   Procedure: CYSTOSCOPY BOTOX  INJECTION;  Surgeon: Nieves Cough, MD;  [facility]: [facility];  Service: Urology;  Laterality: N/A;   BOTOX  INJECTION N/A 09/13/2020   Procedure: CYSTOSCOPYBOTOX INJECTION SUPRAPUBIC TUBE UPSIZE AND EXCHANGE;  Surgeon: Carolee Sherwood JONETTA DOUGLAS, MD;  [facility]: [facility];  Service: Urology;  Laterality: N/A;   BOTOX  INJECTION N/A 03/07/2021   Procedure: CYSTOSCOPY BOTOX  INJECTION AND SUPRAPUBIC TUBE EXCHANGE;  Surgeon: Carolee Sherwood JONETTA DOUGLAS, MD;  [facility]: [facility];  Service: Urology;  Laterality: N/A;  REQUESTING 54 MINS FOR CASE   BOTOX  INJECTION N/A 09/03/2021   Procedure: CYSTOSCOPY BOTOX  INJECTION;  Surgeon: Carolee Sherwood JONETTA DOUGLAS, MD;  [facility]: [facility];  Service: Urology;  Laterality: N/A;   BOTOX  INJECTION N/A 04/01/2022   Procedure: CYSTOSCOPYBOTOX INJECTION;  Surgeon: Carolee Sherwood JONETTA DOUGLAS, MD;  [facility];  Service: Urology;  Laterality: N/A;    BOTOX  INJECTION N/A 11/04/2022   Procedure: CYSTOSCOPY BOTOX  INJECTION 200 UNITS;  Surgeon: Carolee Sherwood JONETTA DOUGLAS, MD;  [facility];  Service: Urology;  Laterality: N/A;   BOTOX  INJECTION N/A 06/13/2023   Procedure: CYSTOSCOPY BOTOX  INJECTION;  Surgeon: Carolee Sherwood JONETTA DOUGLAS, MD;  [facility];  Service: Urology;  Laterality: N/A;  45 minute case   CATARACT EXTRACTION Right    CHOLECYSTECTOMY     COLON SURGERY  2023   w/ colostomy   CYSTOSCOPY N/A 09/02/2018   Procedure: CYSTOSCOPY;  Surgeon: Carolee Sherwood JONETTA DOUGLAS, MD;  [facility];  Service: Urology;  Laterality: N/A;   CYSTOSCOPY WITH INJECTION N/A 06/14/2024   Procedure: CYSTOSCOPY, WITH INJECTION OF BLADDER NECK OR BLADDER WALL;  Surgeon: Carolee Sherwood JONETTA DOUGLAS, MD;  [facility];  Service: Urology;  Laterality: N/A;  200 UNITS OF BOTOX    CYSTOSTOMY N/A 09/02/2018   Procedure: CYSTOSTOMY SUPRAPUBIC;  Surgeon: Carolee Sherwood JONETTA DOUGLAS, MD;  [facility];  Service: Urology;  Laterality: N/A;  45 MINS   FEMUR IM NAIL Right 06/02/2014   Procedure: INTRAMEDULLARY (IM) RETROGRADE FEMORAL NAILING;  Surgeon: Fonda SHAUNNA Olmsted, MD;  Location: [facility];  Service: Orthopedics;  Laterality: Right;   GROIN DISSECTION Left 10/24/2017   Procedure: IRRIGATION AND DEBRIDEMENT, LEFT THIGH ABCESS ;  Surgeon: Ethyl Lenis, MD;  [facility]: [facility];  Service: General;  Laterality:  Left;   INCISION AND DRAINAGE PERIRECTAL ABSCESS N/A 04/07/2021   Procedure: DEBRIDEMENT OF SACRAL DECUBITUS ULCER;  Surgeon: Lyndel Deward PARAS, MD;  [facility];  Service: General;  Laterality: N/A;   INSERTION OF SUPRAPUBIC CATHETER N/A 09/06/2019   Procedure: SUPRAPUBIC TUBE CHANGE;  Surgeon: Carolee Sherwood JONETTA DOUGLAS, MD;  [facility];  Service: Urology;  Laterality: N/A;   INSERTION OF SUPRAPUBIC CATHETER N/A 04/10/2020   Procedure: SUPRAPUBIC CATHETER EXCHANGE/TRACT  DILATION;  Surgeon: Nieves Cough, MD;  [facility];  Service: Urology;  Laterality: N/A;    INSERTION OF SUPRAPUBIC CATHETER N/A 09/03/2021   Procedure: SUPRAPUBIC CATHETER EXCHANGE;  Surgeon: Carolee Sherwood JONETTA DOUGLAS, MD;  [facility];  Service: Urology;  Laterality: N/A;  45 MINS FOR THIS CASE   INSERTION OF SUPRAPUBIC CATHETER N/A 04/01/2022   Procedure: SUPRAPUBIC CATHETER EXCHANGE UPSIZING & DILATION OF SUPRAPUBIC  TRACT;  Surgeon: Carolee Sherwood JONETTA DOUGLAS, MD;  [facility];  Service: Urology;  Laterality: N/A;  45 MINS FOR CASE   INSERTION OF SUPRAPUBIC CATHETER N/A 05/27/2022   Procedure: CYSTOSCOPY SUPRAPUBIC TUBE TRACT DILATION AND SUPRAPUBIC TUBE EXCHANGE;  Surgeon: Carolee Sherwood JONETTA DOUGLAS, MD;  [facility];  Service: Urology;  Laterality: N/A;  45 MINS FOR CASE   INSERTION OF SUPRAPUBIC CATHETER N/A 11/04/2022   Procedure: SUPRAPUBIC CATHETER CHANGE;  Surgeon: Carolee Sherwood JONETTA DOUGLAS, MD;  [facility];  Service: Urology;  Laterality: N/A;  45 MINS FOR CASE   INSERTION OF SUPRAPUBIC CATHETER N/A 06/13/2023   Procedure: exchange OF SUPRAPUBIC CATHETER;  Surgeon: Carolee Sherwood JONETTA DOUGLAS, MD;  [facility];  Service: Urology;  Laterality: N/A;   IR CATHETER TUBE CHANGE  10/13/2017   IR CATHETER TUBE CHANGE  07/15/2018   IR CATHETER TUBE CHANGE  08/26/2019   IR CATHETER TUBE CHANGE  11/25/2019   IR CATHETER TUBE CHANGE  01/06/2020   IR CATHETER TUBE CHANGE  06/13/2020   IR CATHETER TUBE CHANGE  08/01/2020   LAPAROSCOPY N/A 04/07/2021   Procedure: LAPAROSCOPY DIAGNOSTIC; LAPAROSCOPIC CREATION OF TRANSVERSE COLOSTOMY;  Surgeon: Lyndel Deward PARAS, MD;  [facility];  Service: General;  Laterality: N/A;   left hand carpal tunnel surgery     REPLACEMENT TOTAL KNEE BILATERAL  07/01/2002    FAMILY HISTORY: Family History  Problem Relation Age of Onset   Multiple sclerosis Other        neices.    Cancer Mother    Diabetes Mother    GI Bleed Sister        diverticulitis   Thyroid  disease Neg Hx     SOCIAL HISTORY: Social History   Socioeconomic History   Marital status:  Widowed    Spouse name: Not on file   Number of children: 2   Years of education: 73yr colleg   Highest education level: Not on file  Occupational History   Occupation: N/A    Employer: [employer]    Comment: NIGHT RESIDENT [employer]  Tobacco Use   Smoking status: Never    Passive exposure: Never   Smokeless tobacco: Never  Vaping Use   Vaping status: Never Used  Substance and Sexual Activity   Alcohol use: No   Drug use: No   Sexual activity: Never    Birth control/protection: Surgical  Other Topics Concern   Not on file  Social History Narrative   Patient is widowed with 2 children   Patient is right handed   Patient has  2 yrs of college   Patient drinks 2-3 cups of tea daily   Social Drivers of Health   Tobacco Use: Low Risk (06/14/2024)   Patient History    Smoking Tobacco Use: Never    Smokeless Tobacco Use: Never    Passive Exposure: Never  Financial Resource Strain: Not on file  Food Insecurity: No Food Insecurity (02/28/2023)   Hunger Vital Sign    Worried About Running Out of Food in the Last Year: Never true    Ran Out of Food in the Last Year: Never true  Transportation Needs: No Transportation Needs (02/28/2023)   PRAPARE - Administrator, Civil Service (Medical): No    Lack of Transportation (Non-Medical): No  Physical Activity: Not on file  Stress: Not on file  Social Connections: Not on file  Intimate Partner Violence: Not At Risk (02/28/2023)   Humiliation, Afraid, Rape, and Kick questionnaire    Fear of Current or Ex-Partner: No    Emotionally Abused: No    Physically Abused: No    Sexually Abused: No  Depression (PHQ2-9): Low Risk (01/22/2022)   Depression (PHQ2-9)    PHQ-2 Score: 0  Alcohol Screen: Not on file  Housing: Low Risk (02/28/2023)   Housing    Last Housing Risk Score: 0  Utilities: Not At Risk (02/28/2023)   AHC Utilities    Threatened with loss of utilities: No  Health Literacy: Not on file     PHYSICAL  EXAM  Vitals:   07/06/24 0938  BP: 127/73  Pulse: 95    Physical Exam  General: The patient is alert and cooperative at the time of the examination.  Lying on the stretcher.  Has suprapubic catheter, colostomy.  Skin: No significant peripheral edema is noted.  Neurologic Exam  Mental status: The patient is alert and oriented x 3 at the time of the examination. The patient has apparent normal recent and remote memory, with an apparently normal attention span and concentration ability.  Cranial nerves: Facial symmetry is present. Speech is normal, no aphasia or dysarthria is noted. Extraocular movements are full. Visual fields are full.  Provides very concise history.  Motor: Right upper extremity movement is normal, limited mobility of left upper due to rotator cuff 3/5.  No strength or movement to lower extremities.  Sensory examination: Absent touch to bilateral lower extremities.  Decreased sensation to left arm  Coordination: Able to perform finger-nose-finger with the right, limited mobility of the left  Gait and station: Nonambulatory, on a stretcher    DIAGNOSTIC DATA (LABS, IMAGING, TESTING) - I reviewed patient records, labs, notes, testing and imaging myself where available.  Lab Results  Component Value Date   WBC 10.0 06/14/2024   HGB 12.7 06/14/2024   HCT 39.3 06/14/2024   MCV 92.0 06/14/2024   PLT 283 06/14/2024      Component Value Date/Time   NA 140 06/14/2024 1000   NA 144 03/16/2020 0000   NA 144 03/16/2020 0000   K 4.2 06/14/2024 1000   CL 99 06/14/2024 1000   CO2 32 06/14/2024 1000   GLUCOSE 131 (H) 06/14/2024 1000   BUN 33 (H) 06/14/2024 1000   BUN 14 03/16/2020 0000   BUN 14 03/16/2020 0000   CREATININE 0.63 06/14/2024 1000   CREATININE 0.56 (L) 06/08/2021 1057   CALCIUM  10.0 06/14/2024 1000   PROT 6.0 (L) 02/28/2023 0848   PROT 6.3 12/07/2019 1103   ALBUMIN  2.8 (L) 02/28/2023 0848   ALBUMIN  3.5 (  L) 12/07/2019 1103   AST 33 02/28/2023  0848   ALT 32 02/28/2023 0848   ALKPHOS 63 02/28/2023 0848   BILITOT 1.2 02/28/2023 0848   BILITOT 0.3 12/07/2019 1103   GFRNONAA >60 06/14/2024 1000   GFRAA 90 03/16/2020 0000   GFRAA >90 03/16/2020 0000   Lab Results  Component Value Date   CHOL 156 07/30/2019   HDL 56 07/30/2019   LDLCALC 85 07/30/2019   TRIG 77 07/30/2019   Lab Results  Component Value Date   HGBA1C 5.8 (H) 06/14/2024   No results found for: [id number] Lab Results  Component Value Date   TSH 2.34 01/27/2020     ASSESSMENT AND PLAN  1.  Multiple sclerosis 2.  Neurogenic bladder 3.  Paraplegia 4.  Presence of colostomy, suprapubic catheter, chronic osteomyelitis to sacral wound  Last visit Feb 2025, reporting worsening muscle spasms, MRI cervical spine imaging signal abnormality at C4-C5 demyelination versus myelomalacia due to stenosis, moderate canal stenosis C4-C5.  MRI thoracic spine severe canal stenosis T2-L1 that is chronic with myelomalacia with severe stenosis. Saw Neurosurgery reportedly, surgery not pursued since nonambulatory.  Remains on generic dimethyl fumarate . Has been on generic since end of 2023, has felt worsening spasms, nerve pain, hair loss since then.   -Check CBC with diff, CMP to screen for adverse effect from dimethyl fumarate  -For now continue dimethyl fumarate , but will discuss with Dr. Vear long term plan to continue given age, non-ambulatory state  -Recommend continue exercise, physical therapy as tolerated -Follow-up in 6 months or sooner if needed   Orders Placed This Encounter  Procedures   CBC with Differential/Platelet   CMP   Lauraine Gayland MANDES, DNP  [facility] [address], [address] [place], [place] [address] [phone]  "

## 2024-07-07 ENCOUNTER — Telehealth: Payer: Self-pay | Admitting: Neurology

## 2024-07-07 LAB — COMPREHENSIVE METABOLIC PANEL WITH GFR
ALT: 9 IU/L (ref 0–32)
AST: 11 IU/L (ref 0–40)
Albumin: 4.2 g/dL (ref 3.7–4.7)
Alkaline Phosphatase: 70 IU/L (ref 48–129)
BUN/Creatinine Ratio: 35 — ABNORMAL HIGH (ref 12–28)
BUN: 28 mg/dL — ABNORMAL HIGH (ref 8–27)
Bilirubin Total: 0.5 mg/dL (ref 0.0–1.2)
CO2: 30 mmol/L — ABNORMAL HIGH (ref 20–29)
Calcium: 10 mg/dL (ref 8.7–10.3)
Chloride: 96 mmol/L (ref 96–106)
Creatinine, Ser: 0.79 mg/dL (ref 0.57–1.00)
Globulin, Total: 2.1 g/dL (ref 1.5–4.5)
Glucose: 109 mg/dL — ABNORMAL HIGH (ref 70–99)
Potassium: 4.6 mmol/L (ref 3.5–5.2)
Sodium: 142 mmol/L (ref 134–144)
Total Protein: 6.3 g/dL (ref 6.0–8.5)
eGFR: 75 mL/min/1.73

## 2024-07-07 LAB — CBC WITH DIFFERENTIAL/PLATELET
Basophils Absolute: 0.1 x10E3/uL (ref 0.0–0.2)
Basos: 1 %
EOS (ABSOLUTE): 0.2 x10E3/uL (ref 0.0–0.4)
Eos: 2 %
Hematocrit: 42.7 % (ref 34.0–46.6)
Hemoglobin: 13.7 g/dL (ref 11.1–15.9)
Immature Grans (Abs): 0.1 x10E3/uL (ref 0.0–0.1)
Immature Granulocytes: 1 %
Lymphocytes Absolute: 1.3 x10E3/uL (ref 0.7–3.1)
Lymphs: 15 %
MCH: 30.2 pg (ref 26.6–33.0)
MCHC: 32.1 g/dL (ref 31.5–35.7)
MCV: 94 fL (ref 79–97)
Monocytes Absolute: 0.7 x10E3/uL (ref 0.1–0.9)
Monocytes: 8 %
Neutrophils Absolute: 6.3 x10E3/uL (ref 1.4–7.0)
Neutrophils: 73 %
Platelets: 309 x10E3/uL (ref 150–450)
RBC: 4.53 x10E6/uL (ref 3.77–5.28)
RDW: 13.2 % (ref 11.7–15.4)
WBC: 8.6 x10E3/uL (ref 3.4–10.8)

## 2024-07-07 NOTE — Telephone Encounter (Signed)
 Pt called stating  she received call ,Pt chart is not showing any call made to day . Informed Pt that   whoever  called Pt will call back Pt  had Labs done yesterday  and was waiting on results

## 2025-03-09 ENCOUNTER — Ambulatory Visit: Admitting: Neurology
